# Patient Record
Sex: Male | Born: 1945 | Race: White | Hispanic: No | Smoking: Former smoker
Health system: Southern US, Community
[De-identification: ages and names within clinical notes are randomized; demographics above are authoritative.]

## PROBLEM LIST (undated history)

## (undated) DIAGNOSIS — I10 Essential (primary) hypertension: Secondary | ICD-10-CM

## (undated) DIAGNOSIS — I213 ST elevation (STEMI) myocardial infarction of unspecified site: Secondary | ICD-10-CM

## (undated) DIAGNOSIS — R011 Cardiac murmur, unspecified: Secondary | ICD-10-CM

## (undated) DIAGNOSIS — E785 Hyperlipidemia, unspecified: Secondary | ICD-10-CM

## (undated) DIAGNOSIS — I2510 Atherosclerotic heart disease of native coronary artery without angina pectoris: Secondary | ICD-10-CM

## (undated) DIAGNOSIS — R7303 Prediabetes: Secondary | ICD-10-CM

## (undated) DIAGNOSIS — D689 Coagulation defect, unspecified: Secondary | ICD-10-CM

## (undated) DIAGNOSIS — I4891 Unspecified atrial fibrillation: Secondary | ICD-10-CM

## (undated) DIAGNOSIS — M1990 Unspecified osteoarthritis, unspecified site: Secondary | ICD-10-CM

## (undated) DIAGNOSIS — I499 Cardiac arrhythmia, unspecified: Secondary | ICD-10-CM

## (undated) DIAGNOSIS — E119 Type 2 diabetes mellitus without complications: Secondary | ICD-10-CM

## (undated) DIAGNOSIS — E8881 Metabolic syndrome: Secondary | ICD-10-CM

## (undated) DIAGNOSIS — R0600 Dyspnea, unspecified: Secondary | ICD-10-CM

## (undated) DIAGNOSIS — G4730 Sleep apnea, unspecified: Secondary | ICD-10-CM

## (undated) HISTORY — PX: CARDIAC CATHETERIZATION: SHX172

## (undated) HISTORY — DX: Prediabetes: R73.03

## (undated) HISTORY — DX: Metabolic syndrome: E88.810

## (undated) HISTORY — DX: Coagulation defect, unspecified: D68.9

## (undated) HISTORY — PX: ROTATOR CUFF REPAIR: SHX139

## (undated) HISTORY — PX: OTHER SURGICAL HISTORY: SHX169

## (undated) HISTORY — PX: HERNIA REPAIR: SHX51

## (undated) HISTORY — PX: CARDIAC VALVE REPLACEMENT: SHX585

## (undated) HISTORY — DX: Unspecified osteoarthritis, unspecified site: M19.90

## (undated) HISTORY — DX: Cardiac murmur, unspecified: R01.1

## (undated) HISTORY — DX: Cardiac arrhythmia, unspecified: I49.9

## (undated) HISTORY — DX: Hyperlipidemia, unspecified: E78.5

## (undated) HISTORY — PX: APPENDECTOMY: SHX54

## (undated) HISTORY — DX: Essential (primary) hypertension: I10

## (undated) HISTORY — PX: TONSILLECTOMY: SHX5217

## (undated) HISTORY — DX: Metabolic syndrome: E88.81

## (undated) SURGERY — Surgical Case
Anesthesia: *Unknown

---

## 2012-06-16 ENCOUNTER — Ambulatory Visit: Payer: Medicare Other | Attending: Family Medicine | Admitting: Physical Therapy

## 2012-06-16 DIAGNOSIS — M545 Low back pain, unspecified: Secondary | ICD-10-CM | POA: Insufficient documentation

## 2012-06-16 DIAGNOSIS — R5381 Other malaise: Secondary | ICD-10-CM | POA: Insufficient documentation

## 2012-06-16 DIAGNOSIS — IMO0001 Reserved for inherently not codable concepts without codable children: Secondary | ICD-10-CM | POA: Insufficient documentation

## 2012-06-18 ENCOUNTER — Ambulatory Visit: Payer: Medicare Other | Admitting: Physical Therapy

## 2012-06-30 ENCOUNTER — Ambulatory Visit: Payer: Medicare Other | Attending: Family Medicine | Admitting: Physical Therapy

## 2012-06-30 DIAGNOSIS — M545 Low back pain, unspecified: Secondary | ICD-10-CM | POA: Insufficient documentation

## 2012-06-30 DIAGNOSIS — IMO0001 Reserved for inherently not codable concepts without codable children: Secondary | ICD-10-CM | POA: Insufficient documentation

## 2012-06-30 DIAGNOSIS — R5381 Other malaise: Secondary | ICD-10-CM | POA: Insufficient documentation

## 2012-07-02 ENCOUNTER — Ambulatory Visit: Payer: Medicare Other | Admitting: Physical Therapy

## 2012-07-07 ENCOUNTER — Ambulatory Visit: Payer: Medicare Other | Admitting: Physical Therapy

## 2012-07-09 ENCOUNTER — Ambulatory Visit: Payer: Medicare Other | Admitting: Physical Therapy

## 2012-07-14 ENCOUNTER — Ambulatory Visit: Payer: Medicare Other | Admitting: Physical Therapy

## 2012-07-17 ENCOUNTER — Encounter: Payer: Medicare Other | Admitting: Physical Therapy

## 2012-07-22 ENCOUNTER — Other Ambulatory Visit: Payer: Self-pay | Admitting: Family Medicine

## 2012-07-22 DIAGNOSIS — M545 Low back pain, unspecified: Secondary | ICD-10-CM

## 2012-07-25 ENCOUNTER — Other Ambulatory Visit: Payer: Medicare Other

## 2012-07-31 ENCOUNTER — Other Ambulatory Visit: Payer: Self-pay | Admitting: Family Medicine

## 2012-07-31 DIAGNOSIS — Z139 Encounter for screening, unspecified: Secondary | ICD-10-CM

## 2012-08-04 ENCOUNTER — Ambulatory Visit
Admission: RE | Admit: 2012-08-04 | Discharge: 2012-08-04 | Disposition: A | Discharge status: Home / Self Care | Payer: Medicare Other | Source: Ambulatory Visit | Attending: Family Medicine | Admitting: Family Medicine

## 2012-08-04 DIAGNOSIS — M545 Low back pain, unspecified: Secondary | ICD-10-CM

## 2012-08-04 DIAGNOSIS — Z139 Encounter for screening, unspecified: Secondary | ICD-10-CM

## 2012-12-31 ENCOUNTER — Ambulatory Visit (INDEPENDENT_AMBULATORY_CARE_PROVIDER_SITE_OTHER): Payer: Medicare Other | Admitting: General Practice

## 2012-12-31 ENCOUNTER — Encounter: Payer: Self-pay | Admitting: General Practice

## 2012-12-31 VITALS — BP 144/82 | HR 82 | Temp 99.3°F | Ht 69.0 in | Wt 330.0 lb

## 2012-12-31 DIAGNOSIS — T148 Other injury of unspecified body region: Secondary | ICD-10-CM

## 2012-12-31 DIAGNOSIS — W57XXXA Bitten or stung by nonvenomous insect and other nonvenomous arthropods, initial encounter: Secondary | ICD-10-CM

## 2012-12-31 MED ORDER — DOXYCYCLINE HYCLATE 100 MG PO TABS: ORAL_TABLET | ORAL | Status: DC

## 2012-12-31 NOTE — Progress Notes (Signed)
  Subjective:    Patient ID: Ryan Garner, male    DOB: [birth year], 67 y.o.   MRN: [id number]  HPI Presents today with complaints of tick bite and would like labs. Reports he was bitten by a tick in May and on yesterday. Reports the ticks were removed in 24 hours, fully intact.     Review of Systems  Constitutional: Positive for fever. Negative for chills.       Reports feeling warm at home, didn't check temperature  HENT: Negative for neck pain and neck stiffness.   Respiratory: Negative for chest tightness and shortness of breath.   Cardiovascular: Negative for chest pain and palpitations.  Gastrointestinal: Negative for abdominal pain and blood in stool.  Genitourinary: Negative for dysuria, hematuria and difficulty urinating.  Neurological: Positive for dizziness and headaches. Negative for syncope and weakness.       Reports have an episode of slight dizziness and headache that occurred once       Objective:   Physical Exam  Constitutional: He is oriented to person, place, and time. He appears well-developed and well-nourished.  HENT:  Head: Normocephalic and atraumatic.  Right Ear: External ear normal.  Left Ear: External ear normal.  Cardiovascular: Normal rate, regular rhythm and normal heart sounds.   Pulmonary/Chest: Effort normal and breath sounds normal. No respiratory distress. He exhibits no tenderness.  Abdominal: Soft. Bowel sounds are normal. There is no tenderness.  Neurological: He is alert and oriented to person, place, and time.  Skin: Skin is warm and dry. There is erythema.  Erythematous area size of pencil eraser noted to mid back. Negative drainage or tenderness.   Psychiatric: He has a normal mood and affect.          Assessment & Plan:  1. Tick bite - doxycycline (VIBRA-TABS) 100 MG tablet; Take one tablet twice a day on day one, then take one tablet daily for next 14 days  Dispense: 16 tablet; Refill: 0 - B. burgdorfi antibodies - [place]  spotted fvr abs pnl(IgG+IgM) -keep area clean and dry -RTO if symptoms worsen or unresolved -avoid tick infested area and attachement -Patient verbalized understanding -Coralie Keens, FNP-C

## 2012-12-31 NOTE — Patient Instructions (Signed)
Deer Tick Bite Deer ticks are brown arachnids (spider family) that vary in size from as small as the head of a pin to 1/4 inch (1/2 cm) diameter. They thrive in wooded areas. Deer are the preferred host of adult deer ticks. Small rodents are the host of young ticks (nymphs). When a person walks in a field or wooded area, young and adult ticks in the surrounding grass and vegetation can attach themselves to the skin. They can suck blood for hours to days if unnoticed. Ticks are found all over the [place] Some ticks carry a specific bacteria (Borrelia burgdorferi) that causes an infection called Lyme disease. The bacteria is typically passed into a person during the blood sucking process. This happens after the tick has been attached for at least a number of hours. While ticks can be found all over the [place] those carrying the bacteria that causes Lyme disease are most common in [place] and the [place]. Only a small proportion of ticks in these areas carry the Lyme disease bacteria and cause human infections. Ticks usually attach to warm spots on the body, such as the:  Head.  Back.  Neck.  Armpits.  Groin. SYMPTOMS  Most of the time, a deer tick bite will not be felt. You may or may not see the attached tick. You may notice mild irritation or redness around the bite site. If the deer tick passes the Lyme disease bacteria to a person, a round, red rash may be noticed 2 to 3 days after the bite. The rash may be clear in the middle, like a bull's-eye or target. If not treated, other symptoms may develop several days to weeks after the onset of the rash. These symptoms may include:  New rash lesions.  Fatigue and weakness.  General ill feeling and achiness.  Chills.  Headache and neck pain.  Swollen lymph glands.  Sore muscles and joints. 5 to 15% of untreated people with Lyme disease may develop more severe illnesses after several weeks to months. This may include inflammation of the  brain lining (meningitis), nerve palsies, an abnormal heartbeat, or severe muscle and joint pain and inflammation (myositis or arthritis). DIAGNOSIS   Physical exam and medical history.  Viewing the tick if it was saved for confirmation.  Blood tests (to check or confirm the presence of Lyme disease). TREATMENT  Most ticks do not carry disease. If found, an attached tick should be removed using tweezers. Tweezers should be placed under the body of the tick so it is removed by its attachment parts (pincers). If there are signs or symptoms of being sick, or Lyme disease is confirmed, medicines (antibiotics) that kill germs are usually prescribed. In more severe cases, antibiotics may be given through an intravenous (IV) access. HOME CARE INSTRUCTIONS   Always remove ticks with tweezers. Do not use petroleum jelly or other methods to kill or remove the tick. Slide the tweezers under the body and pull out as much as you can. If you are not sure what it is, save it in a jar and show your caregiver.  Once you remove the tick, the skin will heal on its own. Wash your hands and the affected area with water and soap. You may place a bandage on the affected area.  Take medicine as directed. You may be advised to take a full course of antibiotics.  Follow up with your caregiver as recommended. FINDING OUT THE RESULTS OF YOUR TEST Not all test results are available   during your visit. If your test results are not back during the visit, make an appointment with your caregiver to find out the results. Do not assume everything is normal if you have not heard from your caregiver or the medical facility. It is important for you to follow up on all of your test results. PROGNOSIS  If Lyme disease is confirmed, early treatment with antibiotics is very effective. Following preventive guidelines is important since it is possible to get the disease more than once. PREVENTION   Wear long sleeves and long pants in  wooded or grassy areas. Tuck your pants into your socks.  Use an insect repellent while hiking.  Check yourself, your children, and your pets regularly for ticks after playing outside.  Clear piles of leaves or brush from your yard. Ticks might live there. SEEK MEDICAL CARE IF:   You or your child has an oral temperature above 102 F (38.9 C).  You develop a severe headache following the bite.  You feel generally ill.  You notice a rash.  You are having trouble removing the tick.  The bite area has red skin or yellow drainage. SEEK IMMEDIATE MEDICAL CARE IF:   Your face is weak and droopy or you have other neurological symptoms.  You have severe joint pain or weakness. MAKE SURE YOU:   Understand these instructions.  Will watch your condition.  Will get help right away if you are not doing well or get worse. FOR MORE INFORMATION Centers for Disease Control and Prevention: www.cdc.gov American Academy of [employer] Physicians: www.aafp.org Document Released: 10/03/2009 Document Revised: 10/01/2011 Document Reviewed: 10/03/2009 [employer] Patient Information 2014 [employer].  

## 2013-01-01 LAB — B. BURGDORFI ANTIBODIES: B burgdorferi Ab IgG+IgM: 0.19 {ISR}

## 2013-01-02 LAB — ROCKY MTN SPOTTED FVR ABS PNL(IGG+IGM)
RMSF IgG: 0.33 IV
RMSF IgM: 0.14 IV

## 2013-03-06 DIAGNOSIS — M4310 Spondylolisthesis, site unspecified: Secondary | ICD-10-CM | POA: Insufficient documentation

## 2013-03-06 DIAGNOSIS — M5136 Other intervertebral disc degeneration, lumbar region: Secondary | ICD-10-CM | POA: Insufficient documentation

## 2013-03-06 DIAGNOSIS — M51369 Other intervertebral disc degeneration, lumbar region without mention of lumbar back pain or lower extremity pain: Secondary | ICD-10-CM | POA: Insufficient documentation

## 2013-03-06 DIAGNOSIS — M47816 Spondylosis without myelopathy or radiculopathy, lumbar region: Secondary | ICD-10-CM | POA: Insufficient documentation

## 2013-03-06 DIAGNOSIS — M48061 Spinal stenosis, lumbar region without neurogenic claudication: Secondary | ICD-10-CM | POA: Insufficient documentation

## 2013-03-18 ENCOUNTER — Other Ambulatory Visit: Payer: Self-pay | Admitting: Family Medicine

## 2013-06-21 ENCOUNTER — Other Ambulatory Visit: Payer: Self-pay | Admitting: Family Medicine

## 2013-06-23 NOTE — Telephone Encounter (Signed)
Last seen 12/31/12  requesting 90 day supply

## 2013-06-24 ENCOUNTER — Telehealth: Payer: Self-pay | Admitting: *Deleted

## 2013-06-24 NOTE — Telephone Encounter (Signed)
Please inform patient that he will need to schedule appointment for routine follow up of blood pressure and labs. Blood pressure medication refilled for 30 days.

## 2013-06-24 NOTE — Telephone Encounter (Signed)
Aware of 30 day supply of BP meds and must make appt. to follow up with labwork.

## 2013-07-06 DIAGNOSIS — M170 Bilateral primary osteoarthritis of knee: Secondary | ICD-10-CM | POA: Insufficient documentation

## 2013-07-06 DIAGNOSIS — M1711 Unilateral primary osteoarthritis, right knee: Secondary | ICD-10-CM | POA: Insufficient documentation

## 2013-07-22 ENCOUNTER — Other Ambulatory Visit: Payer: Medicare Other

## 2013-07-24 ENCOUNTER — Other Ambulatory Visit: Payer: Self-pay | Admitting: General Practice

## 2013-07-27 NOTE — Telephone Encounter (Signed)
Last seen 12/31/12  Ryan Garner

## 2013-07-31 ENCOUNTER — Encounter: Payer: Medicare Other | Admitting: Family Medicine

## 2013-08-07 ENCOUNTER — Encounter: Payer: Self-pay | Admitting: Family Medicine

## 2013-08-07 ENCOUNTER — Ambulatory Visit (INDEPENDENT_AMBULATORY_CARE_PROVIDER_SITE_OTHER): Payer: Medicare Other | Admitting: Family Medicine

## 2013-08-07 VITALS — BP 142/76 | HR 89 | Temp 99.1°F | Ht 69.0 in | Wt 328.6 lb

## 2013-08-07 DIAGNOSIS — R7309 Other abnormal glucose: Secondary | ICD-10-CM | POA: Diagnosis not present

## 2013-08-07 DIAGNOSIS — I10 Essential (primary) hypertension: Secondary | ICD-10-CM | POA: Insufficient documentation

## 2013-08-07 DIAGNOSIS — Z125 Encounter for screening for malignant neoplasm of prostate: Secondary | ICD-10-CM | POA: Insufficient documentation

## 2013-08-07 DIAGNOSIS — R7303 Prediabetes: Secondary | ICD-10-CM

## 2013-08-07 DIAGNOSIS — E8881 Metabolic syndrome: Secondary | ICD-10-CM | POA: Insufficient documentation

## 2013-08-07 DIAGNOSIS — Z1211 Encounter for screening for malignant neoplasm of colon: Secondary | ICD-10-CM | POA: Insufficient documentation

## 2013-08-07 DIAGNOSIS — Z119 Encounter for screening for infectious and parasitic diseases, unspecified: Secondary | ICD-10-CM | POA: Diagnosis not present

## 2013-08-07 DIAGNOSIS — E785 Hyperlipidemia, unspecified: Secondary | ICD-10-CM | POA: Insufficient documentation

## 2013-08-07 DIAGNOSIS — Z0000 Encounter for general adult medical examination without abnormal findings: Secondary | ICD-10-CM | POA: Diagnosis not present

## 2013-08-07 DIAGNOSIS — R739 Hyperglycemia, unspecified: Secondary | ICD-10-CM | POA: Insufficient documentation

## 2013-08-07 LAB — POCT CBC
Granulocyte percent: 71.4 %G (ref 37–80)
HCT, POC: 44 % (ref 43.5–53.7)
Hemoglobin: 14.3 g/dL (ref 14.1–18.1)
Lymph, poc: 2.3 (ref 0.6–3.4)
MCH, POC: 28.1 pg (ref 27–31.2)
MCHC: 32.6 g/dL (ref 31.8–35.4)
MCV: 86.3 fL (ref 80–97)
MPV: 6.8 fL (ref 0–99.8)
POC Granulocyte: 6.7 (ref 2–6.9)
POC LYMPH PERCENT: 25 %L (ref 10–50)
Platelet Count, POC: 296 10*3/uL (ref 142–424)
RBC: 5.1 M/uL (ref 4.69–6.13)
RDW, POC: 13.5 %
WBC: 9.4 10*3/uL (ref 4.6–10.2)

## 2013-08-07 LAB — POCT GLYCOSYLATED HEMOGLOBIN (HGB A1C): Hemoglobin A1C: 5.8

## 2013-08-07 MED ORDER — LISINOPRIL-HYDROCHLOROTHIAZIDE 10-12.5 MG PO TABS: ORAL_TABLET | ORAL | Status: DC

## 2013-08-07 NOTE — Progress Notes (Signed)
Patient ID: Ryan Garner, male   DOB: [birth year], 68 y.o.   MRN: [id number] SUBJECTIVE: CC: Chief Complaint  Patient presents with  . Annual Exam    [facility] WANTS 90 DAY SUPPLY ON BP MED. ? NEED COLONSCOPY    HPI: Here for an annual physical. Also, for wellness. Patient is here for follow up of hypertension/HLD/PreDM/metabolic  syndrome: denies Headache;deniesChest Pain;denies weakness;denies Shortness of Breath or Orthopnea;denies Visual changes;denies palpitations;denies cough;denies pedal edema;denies symptoms of TIA or stroke; admits to Compliance with medications. denies Problems with medications.   Past Medical History  Diagnosis Date  . Metabolic syndrome   . Hypertension   . Hyperlipidemia   . Prediabetes   . Metabolic syndrome    Past Surgical History  Procedure Laterality Date  . Hernia repair    . Tonsillectomy    . Rotator cuff repair Bilateral    History   Social History  . Marital Status: Married    Spouse Name: N/A    Number of Children: N/A  . Years of Education: N/A   Occupational History  . Not on file.   Social History Main Topics  . Smoking status: Former Games developer  . Smokeless tobacco: Never Used  . Alcohol Use: No  . Drug Use: No  . Sexual Activity: Not on file   Other Topics Concern  . Not on file   Social History Narrative  . No narrative on file   Family History  Problem Relation Age of Onset  . Cancer Mother     lungs  . Coronary artery disease Mother   . Diabetes Brother    Current Outpatient Prescriptions on File Prior to Visit  Medication Sig Dispense Refill  . doxycycline (VIBRA-TABS) 100 MG tablet Take one tablet twice a day on day one, then take one tablet daily for next 14 days  16 tablet  0   No current facility-administered medications on file prior to visit.   No Known Allergies Immunization History  Administered Date(s) Administered  . Pneumococcal Polysaccharide-23 08/07/2013  . Tetanus 07/23/2006   Prior to  Admission medications   Medication Sig Start Date End Date Taking? Authorizing Provider  doxycycline (VIBRA-TABS) 100 MG tablet Take one tablet twice a day on day one, then take one tablet daily for next 14 days 12/31/12   Coralie Keens, FNP  lisinopril-hydrochlorothiazide (PRINZIDE,ZESTORETIC) 10-12.5 MG per tablet TAKE ONE TABLET BY MOUTH ONE TIME DAILY 07/24/13   Coralie Keens, FNP     ROS: As above in the HPI. All other systems are stable or negative.  OBJECTIVE: APPEARANCE:  Patient in no acute distress.The patient appeared well nourished and normally developed. Acyanotic. Waist: VITAL SIGNS:BP 142/76  Pulse 89  Temp(Src) 99.1 F (37.3 C) (Oral)  Ht 5\' 9"  (1.753 m)  Wt 328 lb 9.6 oz (149.052 kg)  BMI 48.50 kg/m2  SpO2 97%  Morbidly obese WM  SKIN: warm and  Dry without overt rashes, tattoos and scars. Multiple skin tags.   HEAD and Neck: without JVD, Head and scalp: normal Eyes:No scleral icterus. Fundi normal, eye movements normal. Ears: Auricle normal, canal normal, Tympanic membranes normal, insufflation normal. Nose: normal Throat: normal Neck & thyroid: normal  CHEST & LUNGS: Chest wall: normal Lungs: Clear  CVS: Reveals the PMI to be normally located. Regular rhythm, First and Second Heart sounds are normal,  absence of murmurs, rubs or gallops. Peripheral vasculature: Radial pulses: normal Dorsal pedis pulses: normal Posterior pulses: normal  ABDOMEN:  Appearance: obese Benign,  no organomegaly, no masses, no Abdominal Aortic enlargement. No Guarding , no rebound. No Bruits. Bowel sounds: normal  RECTAL: heme negative brown stool. Moderately enlarged prostate, smooth, normal groove.  GU: no abnormality. Testes okay No hernias.  EXTREMETIES: nonedematous.  MUSCULOSKELETAL:  Spine: normal Joints: knees , genu valgus, mild crepitus  NEUROLOGIC: oriented to time,place and person; nonfocal. Strength is normal Sensory is normal Reflexes  are normal Cranial Nerves are normal.  Results for orders placed in visit on 12/31/12  B. BURGDORFI ANTIBODIES      Result Value Range   B burgdorferi Ab IgG+IgM 0.19    ROCKY MTN SPOTTED FVR ABS PNL(IGG+IGM)      Result Value Range   RMSF IgG 0.33     RMSF IgM 0.14      ASSESSMENT: Annual physical exam - Plan: POCT CBC  Hypertension - Plan: lisinopril-hydrochlorothiazide (PRINZIDE,ZESTORETIC) 10-12.5 MG per tablet, CMP14+EGFR  Hyperlipidemia - Plan: CMP14+EGFR, NMR, lipoprofile  Hyperglycemia - Plan: POCT glycosylated hemoglobin (Hb A1C)  Screening for prostate cancer - Plan: PSA, total and free  Screening examination for infectious disease - Plan: Hepatitis C antibody  Screen for colon cancer - Plan: POCT CBC, Fecal occult blood, imunochemical  Metabolic syndrome  Prediabetes  PLAN:      HEALTH MAINTENANCE Immunizations: Tetanus-Diphtheria Booster due:2018 Pertusis Booster due:2018 Flu Shot Due: every Fall Pneumonia Vaccine: usually at 70 years of age unless there are certain risk situations. Herpes Zoster/Shingles Vaccine due: usually at 70 years of age HPV HGD:JMEQ age 75 to 48 years in males and females.  Healthy Life Habits: Exercise Goal: 5-6 days/week; start gradually(ie 30 minutes/3days per week) Nutrition: Balanced healthy meals including Vegetables and Fruits. Consider  Reading the following books: 1) Eat to Live by Dr Diona Browner; 2) Prevent and Reverse Heart Disease by Dr Karl Luke.  Vitamins:multivitamin Aspirin:81 mg daily Stop Tobacco Use: Seat Belt Use:+++ recommended Sunscreen Use:+++ recommended  Recommended Screening Tests: Colon Cancer Screening:check with insurance Blood work: today Cholesterol Screening:   today         HIV:      n/a              Hepatitis C(people born 1945-1965):today   Monthly Self Testicular Exam:+++  Eye Exam: every 1 to 2 years recommended Dental Health: at least every 6 months  Others:    Living  Will/Healthcare Power of Attorney: should have this in order with your personal estate planning   Orders Placed This Encounter  Procedures  . Fecal occult blood, imunochemical  . CMP14+EGFR  . NMR, lipoprofile  . PSA, total and free  . Hepatitis C antibody  . POCT CBC  . POCT glycosylated hemoglobin (Hb A1C)   Meds ordered this encounter  Medications  . lisinopril-hydrochlorothiazide (PRINZIDE,ZESTORETIC) 10-12.5 MG per tablet    Sig: TAKE ONE TABLET BY MOUTH ONE TIME DAILY    Dispense:  90 tablet    Refill:  3   Medications Discontinued During This Encounter  Medication Reason  . lisinopril-hydrochlorothiazide (PRINZIDE,ZESTORETIC) 10-12.5 MG per tablet Reorder   Return in about 3 months (around 11/05/2013) for Recheck medical problems.  Ollis Daudelin P. Jacelyn Grip, M.D.

## 2013-08-07 NOTE — Patient Instructions (Addendum)
HEALTH MAINTENANCE Immunizations: Tetanus-Diphtheria Booster due:2018 Pertusis Booster due:2018 Flu Shot Due: every Fall Pneumonia Vaccine: usually at 70 years of age unless there are certain risk situations. Herpes Zoster/Shingles Vaccine due: usually at 70 years of age HPV JKD:TOIZ age 57 to 73 years in males and females.  Healthy Life Habits: Exercise Goal: 5-6 days/week; start gradually(ie 30 minutes/3days per week) Nutrition: Balanced healthy meals including Vegetables and Fruits. Consider  Reading the following books: 1) Eat to Live by Dr Diona Browner; 2) Prevent and Reverse Heart Disease by Dr Karl Luke.  Vitamins:multivitamin Aspirin:81 mg daily Stop Tobacco Use: Seat Belt Use:+++ recommended Sunscreen Use:+++ recommended  Recommended Screening Tests: Colon Cancer Screening:check with insurance Blood work: today Cholesterol Screening:   today         HIV:      n/a              Hepatitis C(people born 1945-1965):today   Monthly Self Testicular Exam:+++  Eye Exam: every 1 to 2 years recommended Dental Health: at least every 6 months  Others:    Living Will/Healthcare Power of Attorney: should have this in order with your personal estate planning

## 2013-08-09 ENCOUNTER — Other Ambulatory Visit: Payer: Self-pay | Admitting: Family Medicine

## 2013-08-09 DIAGNOSIS — E7800 Pure hypercholesterolemia, unspecified: Secondary | ICD-10-CM

## 2013-08-09 LAB — NMR, LIPOPROFILE
Cholesterol: 215 mg/dL — ABNORMAL HIGH (ref <200)
HDL Cholesterol by NMR: 51 mg/dL (ref >=40)
HDL Particle Number: 40.5 umol/L (ref >=30.5)
LDL Particle Number: 2075 nmol/L — ABNORMAL HIGH (ref <1000)
LDL Size: 20.9 nm (ref >20.5)
LDLC SERPL CALC-MCNC: 147 mg/dL — ABNORMAL HIGH (ref <100)
LP-IR Score: 46 — ABNORMAL HIGH (ref <=45)
Small LDL Particle Number: 808 nmol/L — ABNORMAL HIGH (ref <=527)
Triglycerides by NMR: 84 mg/dL (ref <150)

## 2013-08-09 LAB — CMP14+EGFR
ALT: 19 IU/L (ref 0–44)
AST: 17 IU/L (ref 0–40)
Albumin/Globulin Ratio: 2 (ref 1.1–2.5)
Albumin: 4.5 g/dL (ref 3.6–4.8)
Alkaline Phosphatase: 62 IU/L (ref 39–117)
BUN/Creatinine Ratio: 15 (ref 10–22)
BUN: 16 mg/dL (ref 8–27)
CO2: 26 mmol/L (ref 18–29)
Calcium: 10.1 mg/dL (ref 8.6–10.2)
Chloride: 96 mmol/L — ABNORMAL LOW (ref 97–108)
Creatinine, Ser: 1.07 mg/dL (ref 0.76–1.27)
GFR calc Af Amer: 81 mL/min/{1.73_m2} (ref >59)
GFR calc non Af Amer: 70 mL/min/{1.73_m2} (ref >59)
Globulin, Total: 2.2 g/dL (ref 1.5–4.5)
Glucose: 91 mg/dL (ref 65–99)
Potassium: 5 mmol/L (ref 3.5–5.2)
Sodium: 138 mmol/L (ref 134–144)
Total Bilirubin: 0.4 mg/dL (ref 0.0–1.2)
Total Protein: 6.7 g/dL (ref 6.0–8.5)

## 2013-08-09 LAB — HEPATITIS C ANTIBODY: Hep C Virus Ab: 0.1 s/co ratio (ref 0.0–0.9)

## 2013-08-09 LAB — PSA, TOTAL AND FREE
PSA, Free Pct: 18.2 %
PSA, Free: 0.4 ng/mL
PSA: 2.2 ng/mL (ref 0.0–4.0)

## 2013-08-09 MED ORDER — PRAVASTATIN SODIUM 10 MG PO TABS: 10.0000 mg | ORAL_TABLET | Freq: Every day | ORAL | Status: DC

## 2013-08-09 MED ORDER — ASPIRIN EC 81 MG PO TBEC: 81.0000 mg | DELAYED_RELEASE_TABLET | Freq: Every day | ORAL | Status: DC

## 2013-08-09 NOTE — Progress Notes (Signed)
Quick Note:  Call Patient Labs that are abnormal: HGBA1C is prediabetes The LDL particles is high and the LDLc is not at goal of less than 100.  The rest are at goal  Recommendations: I suggest that he starts on a low dose statin and a baby aspirin daily. Ordered in EPIC. Follow up in 3 months   ______

## 2013-08-10 ENCOUNTER — Telehealth: Payer: Self-pay | Admitting: Family Medicine

## 2013-08-10 NOTE — Telephone Encounter (Signed)
Message copied by Waverly Ferrari on Mon Aug 10, 2013  3:54 PM ------      Message from: Vernie Shanks      Created: Sun Aug 09, 2013  8:57 PM       Call Patient      Labs that are abnormal:      HGBA1C is prediabetes      The LDL particles is high and the LDLc is not at goal of less than 100.            The rest are at goal            Recommendations:      I suggest that he starts on a low dose statin and a baby aspirin daily. Ordered in EPIC.      Follow up in 3 months             ------

## 2013-08-11 ENCOUNTER — Other Ambulatory Visit: Payer: Medicare Other

## 2013-08-11 DIAGNOSIS — Z1212 Encounter for screening for malignant neoplasm of rectum: Secondary | ICD-10-CM | POA: Diagnosis not present

## 2013-08-11 NOTE — Progress Notes (Signed)
Pt came in for labs only 

## 2013-08-12 ENCOUNTER — Encounter: Payer: Self-pay | Admitting: *Deleted

## 2013-08-12 LAB — FECAL OCCULT BLOOD, IMMUNOCHEMICAL: Fecal Occult Bld: NEGATIVE

## 2013-08-12 NOTE — Progress Notes (Signed)
Quick Note:  Copy of labs sent to patient ______ 

## 2013-09-07 DIAGNOSIS — E669 Obesity, unspecified: Secondary | ICD-10-CM | POA: Diagnosis not present

## 2013-09-07 DIAGNOSIS — M4310 Spondylolisthesis, site unspecified: Secondary | ICD-10-CM | POA: Diagnosis not present

## 2013-09-07 DIAGNOSIS — M48061 Spinal stenosis, lumbar region without neurogenic claudication: Secondary | ICD-10-CM | POA: Diagnosis not present

## 2013-09-07 DIAGNOSIS — M5137 Other intervertebral disc degeneration, lumbosacral region: Secondary | ICD-10-CM | POA: Diagnosis not present

## 2013-10-05 ENCOUNTER — Telehealth: Payer: Self-pay | Admitting: Family Medicine

## 2013-10-05 DIAGNOSIS — H02429 Myogenic ptosis of unspecified eyelid: Secondary | ICD-10-CM | POA: Diagnosis not present

## 2013-10-05 DIAGNOSIS — H2510 Age-related nuclear cataract, unspecified eye: Secondary | ICD-10-CM | POA: Diagnosis not present

## 2013-10-05 NOTE — Telephone Encounter (Signed)
SPOKE WITH PT AND SAYS [facility]  CAN WORK HIM IN TODAY. NEEDS NOTE SAYING HE CAN TAKE THE YELLOW FEVER SHOT  FAX NOTE TO [phone]  NEEDS NOTE BEFORE 1;30 today

## 2013-10-05 NOTE — Telephone Encounter (Signed)
Note faxed as requested

## 2013-10-14 ENCOUNTER — Telehealth: Payer: Self-pay | Admitting: Family Medicine

## 2013-10-14 NOTE — Telephone Encounter (Signed)
Appt given for first thing in the am per wifes request

## 2013-10-15 ENCOUNTER — Ambulatory Visit (INDEPENDENT_AMBULATORY_CARE_PROVIDER_SITE_OTHER): Payer: Medicare Other | Admitting: Family Medicine

## 2013-10-15 ENCOUNTER — Encounter: Payer: Self-pay | Admitting: Family Medicine

## 2013-10-15 ENCOUNTER — Ambulatory Visit: Payer: Medicare Other | Admitting: Family Medicine

## 2013-10-15 ENCOUNTER — Ambulatory Visit (INDEPENDENT_AMBULATORY_CARE_PROVIDER_SITE_OTHER): Payer: Medicare Other

## 2013-10-15 VITALS — BP 156/88 | HR 81 | Temp 98.8°F | Ht 69.0 in | Wt 337.8 lb

## 2013-10-15 DIAGNOSIS — R7309 Other abnormal glucose: Secondary | ICD-10-CM | POA: Diagnosis not present

## 2013-10-15 DIAGNOSIS — E8881 Metabolic syndrome: Secondary | ICD-10-CM

## 2013-10-15 DIAGNOSIS — R059 Cough, unspecified: Secondary | ICD-10-CM | POA: Diagnosis not present

## 2013-10-15 DIAGNOSIS — R05 Cough: Secondary | ICD-10-CM

## 2013-10-15 DIAGNOSIS — R062 Wheezing: Secondary | ICD-10-CM | POA: Insufficient documentation

## 2013-10-15 DIAGNOSIS — J209 Acute bronchitis, unspecified: Secondary | ICD-10-CM | POA: Insufficient documentation

## 2013-10-15 DIAGNOSIS — R7303 Prediabetes: Secondary | ICD-10-CM

## 2013-10-15 DIAGNOSIS — I10 Essential (primary) hypertension: Secondary | ICD-10-CM

## 2013-10-15 DIAGNOSIS — E785 Hyperlipidemia, unspecified: Secondary | ICD-10-CM

## 2013-10-15 MED ORDER — ALBUTEROL SULFATE HFA 108 (90 BASE) MCG/ACT IN AERS: 2.0000 | INHALATION_SPRAY | Freq: Four times a day (QID) | RESPIRATORY_TRACT | Status: DC | PRN

## 2013-10-15 MED ORDER — AMOXICILLIN 875 MG PO TABS: 875.0000 mg | ORAL_TABLET | Freq: Two times a day (BID) | ORAL | Status: DC

## 2013-10-15 NOTE — Patient Instructions (Signed)
Bronchospasm, Adult A bronchospasm is when the tubes that carry air in and out of your lungs (airwarys) spasm or tighten. During a bronchospasm it is hard to breathe. This is because the airways get smaller. A bronchospasm can be triggered by:  Allergies. These may be to animals, pollen, food, or mold.  Infection. This is a common cause of bronchospasm.  Exercise.  Irritants. These include pollution, cigarette smoke, strong odors, aerosol sprays, and paint fumes.  Weather changes.  Stress.  Being emotional. HOME CARE   Always have a plan for getting help. Know when to call your doctor and local emergency services (911 in the [place] Know where you can get emergency care.  Only take medicines as told by your doctor.  If you were prescribed an inhaler or nebulizer machine, ask your doctor how to use it correctly. Always use a spacer with your inhaler if you were given one.  Stay calm during an attack. Try to relax and breathe more slowly.  Control your home environment:  Change your heating and air conditioning filter at least once a month.  Limit your use of fireplaces and wood stoves.  Do not  smoke. Do not  allow smoking in your home.  Avoid perfumes and fragrances.  Get rid of pests (such as roaches and mice) and their droppings.  Throw away plants if you see mold on them.  Keep your house clean and dust free.  Replace carpet with wood, tile, or vinyl flooring. Carpet can trap dander and dust.  Use allergy-proof pillows, mattress covers, and box spring covers.  Wash bed sheets and blankets every week in hot water. Dry them in a dryer.  Use blankets that are made of polyester or cotton.  Wash hands frequently. GET HELP IF:  You have muscle aches.  You have chest pain.  The thick spit you spit or cough up (sputum) changes from clear or white to yellow, green, gray, or bloody.  The thick spit you spit or cough up gets thicker.  There are problems that may be  related to the medicine you are given such as:  A rash.  Itching.  Swelling.  Trouble breathing. GET HELP RIGHT AWAY IF:  You feel you cannot breathe or catch your breath.  You cannot stop coughing.  Your treatment is not helping you breathe better. MAKE SURE YOU:   Understand these instructions.  Will watch your condition.  Will get help right away if you are not doing well or get worse. Document Released: 05/06/2009 Document Revised: 03/11/2013 Document Reviewed: 12/30/2012 [facility] Patient Information 2014 [employer].    Acute Bronchitis Bronchitis is inflammation of the airways that extend from the windpipe into the lungs (bronchi). The inflammation often causes mucus to develop. This leads to a cough, which is the most common symptom of bronchitis.  In acute bronchitis, the condition usually develops suddenly and goes away over time, usually in a couple weeks. Smoking, allergies, and asthma can make bronchitis worse. Repeated episodes of bronchitis may cause further lung problems.  CAUSES Acute bronchitis is most often caused by the same virus that causes a cold. The virus can spread from person to person (contagious).  SIGNS AND SYMPTOMS   Cough.   Fever.   Coughing up mucus.   Body aches.   Chest congestion.   Chills.   Shortness of breath.   Sore throat.  DIAGNOSIS  Acute bronchitis is usually diagnosed through a physical exam. Tests, such as chest X-rays, are sometimes  done to rule out other conditions.  TREATMENT  Acute bronchitis usually goes away in a couple weeks. Often times, no medical treatment is necessary. Medicines are sometimes given for relief of fever or cough. Antibiotics are usually not needed but may be prescribed in certain situations. In some cases, an inhaler may be recommended to help reduce shortness of breath and control the cough. A cool mist vaporizer may also be used to help thin bronchial secretions and make it  easier to clear the chest.  HOME CARE INSTRUCTIONS  Get plenty of rest.   Drink enough fluids to keep your urine clear or pale yellow (unless you have a medical condition that requires fluid restriction). Increasing fluids may help thin your secretions and will prevent dehydration.   Only take over-the-counter or prescription medicines as directed by your health care provider.   Avoid smoking and secondhand smoke. Exposure to cigarette smoke or irritating chemicals will make bronchitis worse. If you are a smoker, consider using nicotine gum or skin patches to help control withdrawal symptoms. Quitting smoking will help your lungs heal faster.   Reduce the chances of another bout of acute bronchitis by washing your hands frequently, avoiding people with cold symptoms, and trying not to touch your hands to your mouth, nose, or eyes.   Follow up with your health care provider as directed.  SEEK MEDICAL CARE IF: Your symptoms do not improve after 1 week of treatment.  SEEK IMMEDIATE MEDICAL CARE IF:  You develop an increased fever or chills.   You have chest pain.   You have severe shortness of breath.  You have bloody sputum.   You develop dehydration.  You develop fainting.  You develop repeated vomiting.  You develop a severe headache. MAKE SURE YOU:   Understand these instructions.  Will watch your condition.  Will get help right away if you are not doing well or get worse. Document Released: 08/16/2004 Document Revised: 03/11/2013 Document Reviewed: 12/30/2012 [facility] Patient Information 2014 [place].

## 2013-10-15 NOTE — Progress Notes (Signed)
Patient ID: Ryan Garner, male   DOB: May 31, 1946, 68 y.o.   MRN: [id number] SUBJECTIVE: CC: Chief Complaint  Patient presents with  . Acute Visit    coughing up "alot of stuff" states also have had wheezing    HPI: Acute Bronchitis Patient presents for evaluation of fever, nasal congestion, productive cough and sneezing. Symptoms began 4 days ago and are gradually worsening since that time. Past history is significant for asthma as a child..   Past Medical History  Diagnosis Date  . Metabolic syndrome   . Hypertension   . Hyperlipidemia   . Prediabetes   . Metabolic syndrome    Past Surgical History  Procedure Laterality Date  . Hernia repair    . Tonsillectomy    . Rotator cuff repair Bilateral    History   Social History  . Marital Status: Married    Spouse Name: N/A    Number of Children: N/A  . Years of Education: N/A   Occupational History  . Not on file.   Social History Main Topics  . Smoking status: Former Research scientist (life sciences)  . Smokeless tobacco: Never Used  . Alcohol Use: No  . Drug Use: No  . Sexual Activity: Not on file   Other Topics Concern  . Not on file   Social History Narrative  . No narrative on file   Family History  Problem Relation Age of Onset  . Cancer Mother     lungs  . Coronary artery disease Mother   . Diabetes Brother    Current Outpatient Prescriptions on File Prior to Visit  Medication Sig Dispense Refill  . aspirin EC 81 MG tablet Take 1 tablet (81 mg total) by mouth daily.  30 tablet  11  . doxycycline (VIBRA-TABS) 100 MG tablet Take one tablet twice a day on day one, then take one tablet daily for next 14 days  16 tablet  0  . lisinopril-hydrochlorothiazide (PRINZIDE,ZESTORETIC) 10-12.5 MG per tablet TAKE ONE TABLET BY MOUTH ONE TIME DAILY  90 tablet  3  . pravastatin (PRAVACHOL) 10 MG tablet Take 1 tablet (10 mg total) by mouth daily.  30 tablet  3   No current facility-administered medications on file prior to visit.   No Known  Allergies Immunization History  Administered Date(s) Administered  . Pneumococcal Polysaccharide-23 08/07/2013  . Tetanus 07/23/2006   Prior to Admission medications   Medication Sig Start Date End Date Taking? Authorizing Provider  aspirin EC 81 MG tablet Take 1 tablet (81 mg total) by mouth daily. 08/09/13   Vernie Shanks, MD  doxycycline (VIBRA-TABS) 100 MG tablet Take one tablet twice a day on day one, then take one tablet daily for next 14 days 12/31/12   Erby Pian, FNP  lisinopril-hydrochlorothiazide (PRINZIDE,ZESTORETIC) 10-12.5 MG per tablet TAKE ONE TABLET BY MOUTH ONE TIME DAILY 08/07/13   Vernie Shanks, MD  pravastatin (PRAVACHOL) 10 MG tablet Take 1 tablet (10 mg total) by mouth daily. 08/09/13   Vernie Shanks, MD     ROS: As above in the HPI. All other systems are stable or negative.  OBJECTIVE: APPEARANCE:  Patient in no acute distress.The patient appeared well nourished and normally developed. Acyanotic. Waist: VITAL SIGNS:BP 156/88  Pulse 81  Temp(Src) 98.8 F (37.1 C) (Oral)  Ht 5\' 9"  (1.753 m)  Wt 337 lb 12.8 oz (153.225 kg)  BMI 49.86 kg/m2  SpO2 97% Morbidly obese WM  SKIN: warm and  Dry without overt rashes, tattoos and  scars  HEAD and Neck: without JVD, Head and scalp: normal Eyes:No scleral icterus. Fundi normal, eye movements normal. Ears: Auricle normal, canal normal, Tympanic membranes normal, insufflation normal. Nose: normal Throat: normal Neck & thyroid: normal  CHEST & LUNGS: Chest wall: normal Lungs: Coarse BS and scatterred rhonchi and end expiratory wheezes.Rales  CVS: Reveals the PMI to be normally located. Regular rhythm, First and Second Heart sounds are normal,  absence of murmurs, rubs or gallops. Peripheral vasculature: Radial pulses: normal Dorsal pedis pulses: normal Posterior pulses: normal  ABDOMEN:  Appearance: Obese Benign, no organomegaly, no masses, no Abdominal Aortic enlargement. No Guarding , no  rebound. No Bruits. Bowel sounds: normal  RECTAL: N/A GU: N/A  EXTREMETIES: nonedematous.  MUSCULOSKELETAL:  Spine: normal Joints: intact  NEUROLOGIC: oriented to time,place and person; nonfocal.  ASSESSMENT:  Acute bronchitis - Plan: amoxicillin (AMOXIL) 875 MG tablet  Cough - Plan: DG Chest 2 View  Prediabetes  Metabolic syndrome  Hypertension  Hyperlipidemia  Wheezing - Plan: albuterol (PROVENTIL HFA;VENTOLIN HFA) 108 (90 BASE) MCG/ACT inhaler  PLAN:  Orders Placed This Encounter  Procedures  . DG Chest 2 View    Standing Status: Future     Number of Occurrences: 1     Standing Expiration Date: 12/15/2014    Order Specific Question:  Reason for Exam (SYMPTOM  OR DIAGNOSIS REQUIRED)    Answer:  cough    Order Specific Question:  Preferred imaging location?    Answer:  Internal  [facility] reading (PRIMARY) by  Dr. Jacelyn Grip: poor inspiration, increased bibasilar markings from poor inspiration and bronchitis.                                   Meds ordered this encounter  Medications  . amoxicillin (AMOXIL) 875 MG tablet    Sig: Take 1 tablet (875 mg total) by mouth 2 (two) times daily.    Dispense:  20 tablet    Refill:  0  . albuterol (PROVENTIL HFA;VENTOLIN HFA) 108 (90 BASE) MCG/ACT inhaler    Sig: Inhale 2 puffs into the lungs every 6 (six) hours as needed for wheezing or shortness of breath.    Dispense:  1 Inhaler    Refill:  0   There are no discontinued medications. Return if symptoms worsen or fail to improve. Also when he comes back from his mission trip in April, he will be due for  Routine follow up.   Farheen Pfahler P. Jacelyn Grip, M.D.

## 2013-10-26 ENCOUNTER — Other Ambulatory Visit: Payer: Self-pay | Admitting: Family Medicine

## 2013-10-27 NOTE — Telephone Encounter (Signed)
Patient seen in office on 10-15-13 and rxd this med. Please advise on RF

## 2013-10-28 DIAGNOSIS — M1710 Unilateral primary osteoarthritis, unspecified knee: Secondary | ICD-10-CM | POA: Diagnosis not present

## 2013-10-30 NOTE — Telephone Encounter (Signed)
Patient needs to be seen. Refill denied. Bring all medications at next office visit. 

## 2013-11-12 DIAGNOSIS — H02429 Myogenic ptosis of unspecified eyelid: Secondary | ICD-10-CM | POA: Diagnosis not present

## 2013-11-13 ENCOUNTER — Encounter: Payer: Self-pay | Admitting: Family Medicine

## 2013-11-13 ENCOUNTER — Ambulatory Visit (INDEPENDENT_AMBULATORY_CARE_PROVIDER_SITE_OTHER): Payer: Medicare Other | Admitting: Family Medicine

## 2013-11-13 VITALS — BP 139/76 | HR 76 | Temp 98.6°F | Ht 69.0 in | Wt 338.0 lb

## 2013-11-13 DIAGNOSIS — E785 Hyperlipidemia, unspecified: Secondary | ICD-10-CM | POA: Diagnosis not present

## 2013-11-13 DIAGNOSIS — J019 Acute sinusitis, unspecified: Secondary | ICD-10-CM

## 2013-11-13 DIAGNOSIS — I10 Essential (primary) hypertension: Secondary | ICD-10-CM | POA: Diagnosis not present

## 2013-11-13 DIAGNOSIS — R7303 Prediabetes: Secondary | ICD-10-CM

## 2013-11-13 DIAGNOSIS — E8881 Metabolic syndrome: Secondary | ICD-10-CM

## 2013-11-13 DIAGNOSIS — R7309 Other abnormal glucose: Secondary | ICD-10-CM

## 2013-11-13 LAB — POCT GLYCOSYLATED HEMOGLOBIN (HGB A1C): Hemoglobin A1C: 5.8

## 2013-11-13 MED ORDER — FLUTICASONE PROPIONATE 50 MCG/ACT NA SUSP: 2.0000 | Freq: Every day | NASAL | Status: DC

## 2013-11-13 MED ORDER — CEFDINIR 300 MG PO CAPS: 300.0000 mg | ORAL_CAPSULE | Freq: Two times a day (BID) | ORAL | Status: DC

## 2013-11-13 NOTE — Progress Notes (Signed)
Patient ID: Ryan Garner, male   DOB: [birth year], 68 y.o.   MRN: [id number] SUBJECTIVE: CC: Chief Complaint  Patient presents with  . Follow-up    3 month follow up chornic problems c/o sinus infection    HPI: Patient is here for follow up of hyperlipidemia/HTN/preDM/obesity/: denies Headache;denies Chest Pain;denies weakness;denies Shortness of Breath and orthopnea;denies Visual changes;denies palpitations;denies cough;denies pedal edema;denies symptoms of TIA or stroke;deniesClaudication symptoms. admits to Compliance with medications; denies Problems with medications.  Having ongoing sinus symptoms of pain and nasal conmgestion. He says that it usually requires 2 rounds of antibiotics. No fever. Cheeks are sore.  Past Medical History  Diagnosis Date  . Metabolic syndrome   . Hypertension   . Hyperlipidemia   . Prediabetes   . Metabolic syndrome    Past Surgical History  Procedure Laterality Date  . Hernia repair    . Tonsillectomy    . Rotator cuff repair Bilateral    History   Social History  . Marital Status: Married    Spouse Name: N/A    Number of Children: N/A  . Years of Education: N/A   Occupational History  . Not on file.   Social History Main Topics  . Smoking status: Former Research scientist (life sciences)  . Smokeless tobacco: Never Used  . Alcohol Use: No  . Drug Use: No  . Sexual Activity: Not on file   Other Topics Concern  . Not on file   Social History Narrative  . No narrative on file   Family History  Problem Relation Age of Onset  . Cancer Mother     lungs  . Coronary artery disease Mother   . Diabetes Brother    Current Outpatient Prescriptions on File Prior to Visit  Medication Sig Dispense Refill  . albuterol (PROVENTIL HFA;VENTOLIN HFA) 108 (90 BASE) MCG/ACT inhaler Inhale 2 puffs into the lungs every 6 (six) hours as needed for wheezing or shortness of breath.  1 Inhaler  0  . aspirin EC 81 MG tablet Take 1 tablet (81 mg total) by mouth daily.  30 tablet   11  . lisinopril-hydrochlorothiazide (PRINZIDE,ZESTORETIC) 10-12.5 MG per tablet TAKE ONE TABLET BY MOUTH ONE TIME DAILY  90 tablet  3  . pravastatin (PRAVACHOL) 10 MG tablet Take 1 tablet (10 mg total) by mouth daily.  30 tablet  3   No current facility-administered medications on file prior to visit.   No Known Allergies Immunization History  Administered Date(s) Administered  . Pneumococcal Polysaccharide-23 08/07/2013  . Tetanus 07/23/2006   Prior to Admission medications   Medication Sig Start Date End Date Taking? Authorizing Provider  albuterol (PROVENTIL HFA;VENTOLIN HFA) 108 (90 BASE) MCG/ACT inhaler Inhale 2 puffs into the lungs every 6 (six) hours as needed for wheezing or shortness of breath. 10/15/13  Yes Vernie Shanks, MD  aspirin EC 81 MG tablet Take 1 tablet (81 mg total) by mouth daily. 08/09/13  Yes Vernie Shanks, MD  lisinopril-hydrochlorothiazide (PRINZIDE,ZESTORETIC) 10-12.5 MG per tablet TAKE ONE TABLET BY MOUTH ONE TIME DAILY 08/07/13  Yes Vernie Shanks, MD  pravastatin (PRAVACHOL) 10 MG tablet Take 1 tablet (10 mg total) by mouth daily. 08/09/13  Yes Vernie Shanks, MD  amoxicillin (AMOXIL) 875 MG tablet Take 1 tablet (875 mg total) by mouth 2 (two) times daily. 10/15/13   Vernie Shanks, MD  doxycycline (VIBRA-TABS) 100 MG tablet Take one tablet twice a day on day one, then take one tablet daily for next 14 days  12/31/12   Erby Pian, FNP     ROS: As above in the HPI. All other systems are stable or negative.  OBJECTIVE: APPEARANCE:  Patient in no acute distress.The patient appeared well nourished and normally developed. Acyanotic. Waist: VITAL SIGNS:BP 139/76  Pulse 76  Temp(Src) 98.6 F (37 C) (Oral)  Ht _0  (1.753 m)  Wt 338 lb (153.316 kg)  BMI 49.89 kg/m2  Morbidly obese WM  SKIN: warm and  Dry without overt rashes, tattoos and scars  HEAD and Neck: without JVD, Head and scalp: normal Eyes:No scleral icterus. Fundi normal, eye movements  normal. Ears: Auricle normal, canal normal, Tympanic membranes normal, insufflation normal. Nose: nasal congestion.paranasal tenderness. Boggy swollen nasal mucosa. Throat: normal Neck & thyroid: normal  CHEST & LUNGS: Chest wall: normal Lungs: Clear  CVS: Reveals the PMI to be normally located. Regular rhythm, First and Second Heart sounds are normal,  absence of murmurs, rubs or gallops. Peripheral vasculature: Radial pulses: normal Dorsal pedis pulses: normal Posterior pulses: normal  ABDOMEN:  Appearance: obese Benign, no organomegaly, no masses, no Abdominal Aortic enlargement. No Guarding , no rebound. No Bruits. Bowel sounds: normal  RECTAL: N/A GU: N/A  EXTREMETIES: nonedematous.  MUSCULOSKELETAL:  Spine: normal Joints: intact  NEUROLOGIC: oriented to time,place and person; nonfocal. Strength is normal Sensory is normal Reflexes are normal Cranial Nerves are normal.  Results for orders placed in visit on 08/11/13  FECAL OCCULT BLOOD, IMMUNOCHEMICAL      Result Value Ref Range   Fecal Occult Bld Negative  Negative    ASSESSMENT:  Prediabetes - Plan: POCT glycosylated hemoglobin (Hb T6I)  Metabolic syndrome - Plan: CMP14+EGFR  Hypertension - Plan: CMP14+EGFR  Hyperlipidemia - Plan: NMR, lipoprofile  Sinusitis, acute - Plan: cefdinir (OMNICEF) 300 MG capsule, fluticasone (FLONASE) 50 MCG/ACT nasal spray  PLAN:  Reinforced or previous discussions on LTC: dietary choice changes, fruits and veggies and less animal products.; exercise 5 x per week 150 hours. Await labs.  Orders Placed This Encounter  Procedures  . CMP14+EGFR  . NMR, lipoprofile  . POCT glycosylated hemoglobin (Hb A1C)   Meds ordered this encounter  Medications  . cefdinir (OMNICEF) 300 MG capsule    Sig: Take 1 capsule (300 mg total) by mouth 2 (two) times daily.    Dispense:  20 capsule    Refill:  0  . fluticasone (FLONASE) 50 MCG/ACT nasal spray    Sig: Place 2 sprays  into both nostrils daily.    Dispense:  16 g    Refill:  3   Medications Discontinued During This Encounter  Medication Reason  . amoxicillin (AMOXIL) 875 MG tablet Completed Course  . doxycycline (VIBRA-TABS) 100 MG tablet Completed Course   Return in about 4 months (around 03/15/2014) for Recheck medical problems.  Kenichi Cassada P. Jacelyn Grip, M.D.

## 2013-11-14 LAB — CMP14+EGFR
ALT: 22 IU/L (ref 0–44)
AST: 20 IU/L (ref 0–40)
Albumin/Globulin Ratio: 1.7 (ref 1.1–2.5)
Albumin: 4 g/dL (ref 3.6–4.8)
Alkaline Phosphatase: 73 IU/L (ref 39–117)
BUN/Creatinine Ratio: 19 (ref 10–22)
BUN: 19 mg/dL (ref 8–27)
CO2: 27 mmol/L (ref 18–29)
Calcium: 9.9 mg/dL (ref 8.6–10.2)
Chloride: 100 mmol/L (ref 97–108)
Creatinine, Ser: 1.02 mg/dL (ref 0.76–1.27)
GFR calc Af Amer: 86 mL/min/{1.73_m2} (ref >59)
GFR calc non Af Amer: 75 mL/min/{1.73_m2} (ref >59)
Globulin, Total: 2.4 g/dL (ref 1.5–4.5)
Glucose: 114 mg/dL — ABNORMAL HIGH (ref 65–99)
Potassium: 5 mmol/L (ref 3.5–5.2)
Sodium: 141 mmol/L (ref 134–144)
Total Bilirubin: 0.2 mg/dL (ref 0.0–1.2)
Total Protein: 6.4 g/dL (ref 6.0–8.5)

## 2013-11-14 LAB — NMR, LIPOPROFILE
Cholesterol: 154 mg/dL (ref <200)
HDL Cholesterol by NMR: 43 mg/dL (ref >=40)
HDL Particle Number: 34.9 umol/L (ref >=30.5)
LDL Particle Number: 1129 nmol/L — ABNORMAL HIGH (ref <1000)
LDL Size: 20.5 nm (ref >20.5)
LDLC SERPL CALC-MCNC: 78 mg/dL (ref <100)
LP-IR Score: 72 — ABNORMAL HIGH (ref <=45)
Small LDL Particle Number: 710 nmol/L — ABNORMAL HIGH (ref <=527)
Triglycerides by NMR: 163 mg/dL — ABNORMAL HIGH (ref <150)

## 2013-11-15 NOTE — Progress Notes (Signed)
Quick Note:  Call Patient Labs that are abnormal: Pre-diabetic with the HGBA1C at 5.8 Triglycerides a little hig The rest are at goal  Recommendations: Plant based Diet as we discussed and weight loss will correct this. No change in medications   ______

## 2014-01-12 DIAGNOSIS — H02839 Dermatochalasis of unspecified eye, unspecified eyelid: Secondary | ICD-10-CM | POA: Diagnosis not present

## 2014-01-12 DIAGNOSIS — H02429 Myogenic ptosis of unspecified eyelid: Secondary | ICD-10-CM | POA: Diagnosis not present

## 2014-01-14 ENCOUNTER — Ambulatory Visit (INDEPENDENT_AMBULATORY_CARE_PROVIDER_SITE_OTHER): Payer: Medicare Other | Admitting: Family Medicine

## 2014-01-14 ENCOUNTER — Encounter: Payer: Self-pay | Admitting: Family Medicine

## 2014-01-14 VITALS — BP 143/80 | HR 72 | Temp 98.1°F | Ht 69.0 in | Wt 336.0 lb

## 2014-01-14 DIAGNOSIS — L259 Unspecified contact dermatitis, unspecified cause: Secondary | ICD-10-CM

## 2014-01-14 DIAGNOSIS — L309 Dermatitis, unspecified: Secondary | ICD-10-CM

## 2014-01-14 MED ORDER — METHYLPREDNISOLONE ACETATE 80 MG/ML IJ SUSP
80.0000 mg | Freq: Once | INTRAMUSCULAR | Status: AC
  Administered 2014-01-14: 80 mg via INTRAMUSCULAR

## 2014-01-14 MED ORDER — METHYLPREDNISOLONE (PAK) 4 MG PO TABS: ORAL_TABLET | ORAL | Status: DC

## 2014-01-14 NOTE — Progress Notes (Signed)
   Subjective:    Patient ID: Ryan Garner, male    DOB: [birth year], 68 y.o.   MRN: [id number]  HPI  C/o poison ivy dermatitis on chest, arms, and neck.  Review of Systems C/o rash No chest pain, SOB, HA, dizziness, vision change, N/V, diarrhea, constipation, dysuria, urinary urgency or frequency, myalgias, arthralgias.     Objective:   Physical Exam   Vital signs noted  Well developed well nourished male.  HEENT - Head atraumatic Normocephalic Respiratory - Lungs CTA bilateral Cardiac - RRR S1 and S2 without murmur Skin - Erythematous rash over chest, arms, and neck     Assessment & Plan:  Dermatitis - Plan: methylPREDNIsolone (MEDROL DOSPACK) 4 MG tablet, methylPREDNISolone acetate (DEPO-MEDROL) injection 80 mg  Lysbeth Penner FNP

## 2014-02-17 DIAGNOSIS — L570 Actinic keratosis: Secondary | ICD-10-CM | POA: Diagnosis not present

## 2014-02-17 DIAGNOSIS — L819 Disorder of pigmentation, unspecified: Secondary | ICD-10-CM | POA: Diagnosis not present

## 2014-02-17 DIAGNOSIS — D235 Other benign neoplasm of skin of trunk: Secondary | ICD-10-CM | POA: Diagnosis not present

## 2014-02-17 DIAGNOSIS — D485 Neoplasm of uncertain behavior of skin: Secondary | ICD-10-CM | POA: Diagnosis not present

## 2014-03-02 DIAGNOSIS — M4310 Spondylolisthesis, site unspecified: Secondary | ICD-10-CM | POA: Diagnosis not present

## 2014-03-02 DIAGNOSIS — M48061 Spinal stenosis, lumbar region without neurogenic claudication: Secondary | ICD-10-CM | POA: Diagnosis not present

## 2014-03-02 DIAGNOSIS — M47817 Spondylosis without myelopathy or radiculopathy, lumbosacral region: Secondary | ICD-10-CM | POA: Diagnosis not present

## 2014-03-02 DIAGNOSIS — M5137 Other intervertebral disc degeneration, lumbosacral region: Secondary | ICD-10-CM | POA: Diagnosis not present

## 2014-04-01 DIAGNOSIS — M4310 Spondylolisthesis, site unspecified: Secondary | ICD-10-CM | POA: Diagnosis not present

## 2014-04-01 DIAGNOSIS — M5137 Other intervertebral disc degeneration, lumbosacral region: Secondary | ICD-10-CM | POA: Diagnosis not present

## 2014-04-01 DIAGNOSIS — M47817 Spondylosis without myelopathy or radiculopathy, lumbosacral region: Secondary | ICD-10-CM | POA: Diagnosis not present

## 2014-04-20 DIAGNOSIS — M1710 Unilateral primary osteoarthritis, unspecified knee: Secondary | ICD-10-CM | POA: Diagnosis not present

## 2014-04-20 DIAGNOSIS — M67919 Unspecified disorder of synovium and tendon, unspecified shoulder: Secondary | ICD-10-CM | POA: Diagnosis not present

## 2014-04-20 DIAGNOSIS — M7582 Other shoulder lesions, left shoulder: Secondary | ICD-10-CM | POA: Insufficient documentation

## 2014-05-18 DIAGNOSIS — M170 Bilateral primary osteoarthritis of knee: Secondary | ICD-10-CM | POA: Diagnosis not present

## 2014-05-19 DIAGNOSIS — L570 Actinic keratosis: Secondary | ICD-10-CM | POA: Diagnosis not present

## 2014-05-19 DIAGNOSIS — L578 Other skin changes due to chronic exposure to nonionizing radiation: Secondary | ICD-10-CM | POA: Diagnosis not present

## 2014-07-28 DIAGNOSIS — L570 Actinic keratosis: Secondary | ICD-10-CM | POA: Diagnosis not present

## 2014-08-10 ENCOUNTER — Other Ambulatory Visit: Payer: Self-pay | Admitting: *Deleted

## 2014-08-10 MED ORDER — LISINOPRIL-HYDROCHLOROTHIAZIDE 10-12.5 MG PO TABS: ORAL_TABLET | ORAL | Status: DC

## 2014-08-11 ENCOUNTER — Telehealth: Payer: Self-pay | Admitting: Family Medicine

## 2014-08-19 DIAGNOSIS — M47816 Spondylosis without myelopathy or radiculopathy, lumbar region: Secondary | ICD-10-CM | POA: Diagnosis not present

## 2014-08-19 DIAGNOSIS — M4806 Spinal stenosis, lumbar region: Secondary | ICD-10-CM | POA: Diagnosis not present

## 2014-08-19 DIAGNOSIS — M5136 Other intervertebral disc degeneration, lumbar region: Secondary | ICD-10-CM | POA: Diagnosis not present

## 2014-08-19 DIAGNOSIS — M4316 Spondylolisthesis, lumbar region: Secondary | ICD-10-CM | POA: Diagnosis not present

## 2014-08-26 ENCOUNTER — Encounter: Payer: Self-pay | Admitting: Family Medicine

## 2014-08-26 ENCOUNTER — Ambulatory Visit (INDEPENDENT_AMBULATORY_CARE_PROVIDER_SITE_OTHER): Payer: Medicare Other | Admitting: Family Medicine

## 2014-08-26 VITALS — BP 144/80 | HR 76 | Temp 97.6°F | Ht 69.0 in | Wt 338.0 lb

## 2014-08-26 DIAGNOSIS — E785 Hyperlipidemia, unspecified: Secondary | ICD-10-CM

## 2014-08-26 DIAGNOSIS — R7309 Other abnormal glucose: Secondary | ICD-10-CM | POA: Diagnosis not present

## 2014-08-26 DIAGNOSIS — Z23 Encounter for immunization: Secondary | ICD-10-CM

## 2014-08-26 DIAGNOSIS — I10 Essential (primary) hypertension: Secondary | ICD-10-CM | POA: Diagnosis not present

## 2014-08-26 DIAGNOSIS — R7303 Prediabetes: Secondary | ICD-10-CM

## 2014-08-26 MED ORDER — PNEUMOCOCCAL 13-VAL CONJ VACC IM SUSP: 0.5000 mL | INTRAMUSCULAR | Status: DC

## 2014-08-26 NOTE — Progress Notes (Signed)
Subjective:    Patient ID: Ryan Garner, male    DOB: May 18, 1946, 69 y.o.   MRN: [id number]  HPI Comments: Patient in for follow-up of elevated cholesterol. Doing well without complaints on no medication.  Currently no chest pain, shortness of breath or other cardiovascular related symptoms noted.  Diabetes He presents for his follow-up diabetic visit. Diabetes type: Prediabetic. No MedicAlert identification noted. The initial diagnosis of diabetes was made 2 years ago. His disease course has been stable. There are no hypoglycemic associated symptoms. Pertinent negatives for hypoglycemia include no dizziness, headaches or nervousness/anxiousness. Pertinent negatives for diabetes include no polyphagia, no polyuria and no weight loss. There are no hypoglycemic complications. Symptoms are stable. There are no diabetic complications. Risk factors for coronary artery disease include hypertension, obesity, dyslipidemia and family history. Current diabetic treatment includes diet. His weight is fluctuating minimally. He is following a generally healthy diet. Meal planning includes avoidance of concentrated sweets. He has not had a previous visit with a dietitian. He participates in exercise intermittently. An ACE inhibitor/angiotensin II receptor blocker is not being taken.    Patient is here today for a follow up of hypertension.      Patient in for follow-up of hypertension. Patient has no history of headache chest pain or shortness of breath or recent cough. Patient also denies symptoms of TIA such as numbness weakness lateralizing. Patient checks  blood pressure at home and has not had any elevated readings recently. Patient denies side effects from his medication. States taking it regularly.    No Known Allergies  Outpatient Encounter Prescriptions as of 08/26/2014  Medication Sig  . lisinopril-hydrochlorothiazide (PRINZIDE,ZESTORETIC) 10-12.5 MG per tablet TAKE ONE TABLET BY MOUTH ONE TIME DAILY    . [DISCONTINUED] albuterol (PROVENTIL HFA;VENTOLIN HFA) 108 (90 BASE) MCG/ACT inhaler Inhale 2 puffs into the lungs every 6 (six) hours as needed for wheezing or shortness of breath. (Patient not taking: Reported on 08/26/2014)  . [DISCONTINUED] aspirin EC 81 MG tablet Take 1 tablet (81 mg total) by mouth daily. (Patient not taking: Reported on 08/26/2014)  . [DISCONTINUED] fluticasone (FLONASE) 50 MCG/ACT nasal spray Place 2 sprays into both nostrils daily. (Patient not taking: Reported on 08/26/2014)  . [DISCONTINUED] methylPREDNIsolone (MEDROL DOSPACK) 4 MG tablet follow package directions (Patient not taking: Reported on 08/26/2014)  . [DISCONTINUED] pravastatin (PRAVACHOL) 10 MG tablet Take 1 tablet (10 mg total) by mouth daily. (Patient not taking: Reported on 08/26/2014)    Past Medical History  Diagnosis Date  . Metabolic syndrome   . Hypertension   . Hyperlipidemia   . Prediabetes   . Metabolic syndrome     Past Surgical History  Procedure Laterality Date  . Hernia repair    . Tonsillectomy    . Rotator cuff repair Bilateral   . Eyelid surgery Bilateral     History   Social History  . Marital Status: Married    Spouse Name: N/A    Number of Children: N/A  . Years of Education: N/A   Occupational History  . Not on file.   Social History Main Topics  . Smoking status: Former Research scientist (life sciences)  . Smokeless tobacco: Never Used  . Alcohol Use: No  . Drug Use: No  . Sexual Activity: Not on file   Other Topics Concern  . Not on file   Social History Narrative      Review of Systems  Constitutional: Negative for fever, chills, weight loss, diaphoresis and unexpected weight change.  HENT: Negative  for congestion, hearing loss, rhinorrhea, sore throat and trouble swallowing.   Respiratory: Negative for cough, chest tightness, shortness of breath and wheezing.   Gastrointestinal: Negative for nausea, vomiting, abdominal pain, diarrhea, constipation and abdominal distention.   Endocrine: Negative for cold intolerance, heat intolerance, polyphagia and polyuria.  Genitourinary: Negative for dysuria, hematuria and flank pain.  Musculoskeletal: Negative for joint swelling and arthralgias.  Skin: Negative for rash.  Neurological: Negative for dizziness and headaches.  Psychiatric/Behavioral: Negative for dysphoric mood, decreased concentration and agitation. The patient is not nervous/anxious.        Objective:   Physical Exam  Constitutional: He is oriented to person, place, and time. He appears well-developed and well-nourished. No distress.  HENT:  Head: Normocephalic and atraumatic.  Right Ear: External ear normal.  Left Ear: External ear normal.  Nose: Nose normal.  Mouth/Throat: Oropharynx is clear and moist.  Eyes: Conjunctivae and EOM are normal. Pupils are equal, round, and reactive to light.  Neck: Normal range of motion. Neck supple. No thyromegaly present.  Cardiovascular: Normal rate, regular rhythm and normal heart sounds.   No murmur heard. Pulmonary/Chest: Effort normal and breath sounds normal. No respiratory distress. He has no wheezes. He has no rales.  Abdominal: Soft. Bowel sounds are normal. He exhibits no distension. There is no tenderness.  Lymphadenopathy:    He has no cervical adenopathy.  Neurological: He is alert and oriented to person, place, and time. He has normal reflexes.  Skin: Skin is warm and dry.  Psychiatric: He has a normal mood and affect. His behavior is normal. Judgment and thought content normal.   BP 144/80 mmHg  Pulse 76  Temp(Src) 97.6 F (36.4 C) (Oral)  Ht _0  (1.753 m)  Wt 338 lb (153.316 kg)  BMI 49.89 kg/m2        Assessment & Plan:   1. Prediabetes   2. Essential hypertension   3. Hyperlipidemia   4. Need for vaccination     Meds ordered this encounter  Medications  . pneumococcal 13-valent conjugate vaccine (PREVNAR 13) injection 0.5 mL    Sig:     Orders Placed This Encounter   Procedures  . Pneumococcal conjugate vaccine 13-valent IM  . NMR, lipoprofile  . CMP14+EGFR  . POCT glycosylated hemoglobin (Hb A1C)  . POCT urinalysis dipstick    Labs pending Health Maintenance reviewed Diet and exercise encouraged Continue all meds as discussed Follow up in 6 mos  Claretta Fraise, MD

## 2014-08-26 NOTE — Patient Instructions (Signed)
DASH Eating Plan °DASH stands for "Dietary Approaches to Stop Hypertension." The DASH eating plan is a healthy eating plan that has been shown to reduce high blood pressure (hypertension). Additional health benefits may include reducing the risk of type 2 diabetes mellitus, heart disease, and stroke. The DASH eating plan may also help with weight loss. °WHAT DO I NEED TO KNOW ABOUT THE DASH EATING PLAN? °For the DASH eating plan, you will follow these general guidelines: °· Choose foods with a percent daily value for sodium of less than 5% (as listed on the food label). °· Use salt-free seasonings or herbs instead of table salt or sea salt. °· Check with your health care provider or pharmacist before using salt substitutes. °· Eat lower-sodium products, often labeled as "lower sodium" or "no salt added." °· Eat fresh foods. °· Eat more vegetables, fruits, and low-fat dairy products. °· Choose whole grains. Look for the word "whole" as the first word in the ingredient list. °· Choose fish and skinless chicken or turkey more often than red meat. Limit fish, poultry, and meat to 6 oz (170 g) each day. °· Limit sweets, desserts, sugars, and sugary drinks. °· Choose heart-healthy fats. °· Limit cheese to 1 oz (28 g) per day. °· Eat more home-cooked food and less restaurant, buffet, and fast food. °· Limit fried foods. °· Cook foods using methods other than frying. °· Limit canned vegetables. If you do use them, rinse them well to decrease the sodium. °· When eating at a restaurant, ask that your food be prepared with less salt, or no salt if possible. °WHAT FOODS CAN I EAT? °Seek help from a dietitian for individual calorie needs. °Grains °Whole grain or whole wheat bread. Brown rice. Whole grain or whole wheat pasta. Quinoa, bulgur, and whole grain cereals. Low-sodium cereals. Corn or whole wheat flour tortillas. Whole grain cornbread. Whole grain crackers. Low-sodium crackers. °Vegetables °Fresh or frozen vegetables  (raw, steamed, roasted, or grilled). Low-sodium or reduced-sodium tomato and vegetable juices. Low-sodium or reduced-sodium tomato sauce and paste. Low-sodium or reduced-sodium canned vegetables.  °Fruits °All fresh, canned (in natural juice), or frozen fruits. °Meat and Other Protein Products °Ground beef (85% or leaner), grass-fed beef, or beef trimmed of fat. Skinless chicken or turkey. Ground chicken or turkey. Pork trimmed of fat. All fish and seafood. Eggs. Dried beans, peas, or lentils. Unsalted nuts and seeds. Unsalted canned beans. °Dairy °Low-fat dairy products, such as skim or 1% milk, 2% or reduced-fat cheeses, low-fat ricotta or cottage cheese, or plain low-fat yogurt. Low-sodium or reduced-sodium cheeses. °Fats and Oils °Tub margarines without trans fats. Light or reduced-fat mayonnaise and salad dressings (reduced sodium). Avocado. Safflower, olive, or canola oils. Natural peanut or almond butter. °Other °Unsalted popcorn and pretzels. °The items listed above may not be a complete list of recommended foods or beverages. Contact your dietitian for more options. °WHAT FOODS ARE NOT RECOMMENDED? °Grains °White bread. White pasta. White rice. Refined cornbread. Bagels and croissants. Crackers that contain trans fat. °Vegetables °Creamed or fried vegetables. Vegetables in a cheese sauce. Regular canned vegetables. Regular canned tomato sauce and paste. Regular tomato and vegetable juices. °Fruits °Dried fruits. Canned fruit in light or heavy syrup. Fruit juice. °Meat and Other Protein Products °Fatty cuts of meat. Ribs, chicken wings, bacon, sausage, bologna, salami, chitterlings, fatback, hot dogs, bratwurst, and packaged luncheon meats. Salted nuts and seeds. Canned beans with salt. °Dairy °Whole or 2% milk, cream, half-and-half, and cream cheese. Whole-fat or sweetened yogurt. Full-fat   cheeses or blue cheese. Nondairy creamers and whipped toppings. Processed cheese, cheese spreads, or cheese  curds. °Condiments °Onion and garlic salt, seasoned salt, table salt, and sea salt. Canned and packaged gravies. Worcestershire sauce. Tartar sauce. Barbecue sauce. Teriyaki sauce. Soy sauce, including reduced sodium. Steak sauce. Fish sauce. Oyster sauce. Cocktail sauce. Horseradish. Ketchup and mustard. Meat flavorings and tenderizers. Bouillon cubes. Hot sauce. Tabasco sauce. Marinades. Taco seasonings. Relishes. °Fats and Oils °Butter, stick margarine, lard, shortening, ghee, and bacon fat. Coconut, palm kernel, or palm oils. Regular salad dressings. °Other °Pickles and olives. Salted popcorn and pretzels. °The items listed above may not be a complete list of foods and beverages to avoid. Contact your dietitian for more information. °WHERE CAN I FIND MORE INFORMATION? °National Heart, Lung, and [employer] Institute: www.nhlbi.nih.gov/health/health-topics/topics/dash/ °Document Released: 06/28/2011 Document Revised: 11/23/2013 Document Reviewed: 05/13/2013 °ExitCare® Patient Information ©2015 [employer]. This information is not intended to replace advice given to you by your health care provider. Make sure you discuss any questions you have with your health care provider. ° °

## 2014-08-27 ENCOUNTER — Other Ambulatory Visit: Payer: Medicare Other

## 2014-08-27 DIAGNOSIS — I10 Essential (primary) hypertension: Secondary | ICD-10-CM | POA: Diagnosis not present

## 2014-08-27 DIAGNOSIS — R7309 Other abnormal glucose: Secondary | ICD-10-CM | POA: Diagnosis not present

## 2014-08-27 DIAGNOSIS — E785 Hyperlipidemia, unspecified: Secondary | ICD-10-CM | POA: Diagnosis not present

## 2014-08-27 LAB — POCT URINALYSIS DIPSTICK
Bilirubin, UA: NEGATIVE
Blood, UA: NEGATIVE
Glucose, UA: NEGATIVE
Ketones, UA: NEGATIVE
Leukocytes, UA: NEGATIVE
Nitrite, UA: NEGATIVE
Protein, UA: NEGATIVE
Spec Grav, UA: 1.015
Urobilinogen, UA: NEGATIVE
pH, UA: 6

## 2014-08-27 LAB — POCT GLYCOSYLATED HEMOGLOBIN (HGB A1C): Hemoglobin A1C: 6

## 2014-08-27 NOTE — Progress Notes (Signed)
Lab work only for 08/26/14

## 2014-08-27 NOTE — Addendum Note (Signed)
Addended by: Earlene Plater on: 08/27/2014 08:27 AM   Modules accepted: Miquel Dunn

## 2014-08-28 LAB — CMP14+EGFR
ALT: 15 IU/L (ref 0–44)
AST: 7 IU/L (ref 0–40)
Albumin/Globulin Ratio: 2.8 — ABNORMAL HIGH (ref 1.1–2.5)
Albumin: 4.4 g/dL (ref 3.5–4.8)
Alkaline Phosphatase: 72 IU/L (ref 39–117)
BUN/Creatinine Ratio: 23 — ABNORMAL HIGH (ref 10–22)
BUN: 23 mg/dL (ref 8–27)
CO2: 23 mmol/L (ref 18–29)
Calcium: 9.8 mg/dL (ref 8.6–10.2)
Chloride: 99 mmol/L (ref 97–108)
Creatinine, Ser: 1 mg/dL (ref 0.76–1.27)
GFR calc Af Amer: 88 mL/min/{1.73_m2} (ref >59)
GFR calc non Af Amer: 76 mL/min/{1.73_m2} (ref >59)
Globulin, Total: 1.6 g/dL (ref 1.5–4.5)
Glucose: 110 mg/dL — ABNORMAL HIGH (ref 65–99)
Potassium: 5.3 mmol/L — ABNORMAL HIGH (ref 3.5–5.2)
Sodium: 136 mmol/L (ref 134–144)
Total Bilirubin: 0.4 mg/dL (ref 0.0–1.2)
Total Protein: 6 g/dL (ref 6.0–8.5)

## 2014-08-28 LAB — NMR, LIPOPROFILE
Cholesterol: 192 mg/dL (ref 100–199)
HDL Cholesterol by NMR: 56 mg/dL (ref >39)
HDL Particle Number: 39.7 umol/L (ref >=30.5)
LDL Particle Number: 1458 nmol/L — ABNORMAL HIGH (ref <1000)
LDL Size: 21.4 nm (ref >20.5)
LDL-C: 121 mg/dL — ABNORMAL HIGH (ref 0–99)
LP-IR Score: 47 — ABNORMAL HIGH (ref <=45)
Small LDL Particle Number: 264 nmol/L (ref <=527)
Triglycerides by NMR: 74 mg/dL (ref 0–149)

## 2014-09-23 DIAGNOSIS — M25561 Pain in right knee: Secondary | ICD-10-CM | POA: Diagnosis not present

## 2014-10-05 DIAGNOSIS — M5136 Other intervertebral disc degeneration, lumbar region: Secondary | ICD-10-CM | POA: Diagnosis not present

## 2014-10-05 DIAGNOSIS — M4806 Spinal stenosis, lumbar region: Secondary | ICD-10-CM | POA: Diagnosis not present

## 2014-10-05 DIAGNOSIS — M4316 Spondylolisthesis, lumbar region: Secondary | ICD-10-CM | POA: Diagnosis not present

## 2014-10-05 DIAGNOSIS — M4726 Other spondylosis with radiculopathy, lumbar region: Secondary | ICD-10-CM | POA: Diagnosis not present

## 2014-10-05 DIAGNOSIS — Z6841 Body Mass Index (BMI) 40.0 and over, adult: Secondary | ICD-10-CM | POA: Diagnosis not present

## 2014-10-16 DIAGNOSIS — M25461 Effusion, right knee: Secondary | ICD-10-CM | POA: Diagnosis not present

## 2014-10-16 DIAGNOSIS — S83241A Other tear of medial meniscus, current injury, right knee, initial encounter: Secondary | ICD-10-CM | POA: Diagnosis not present

## 2014-10-16 DIAGNOSIS — S83281A Other tear of lateral meniscus, current injury, right knee, initial encounter: Secondary | ICD-10-CM | POA: Diagnosis not present

## 2014-10-16 DIAGNOSIS — M25561 Pain in right knee: Secondary | ICD-10-CM | POA: Diagnosis not present

## 2014-10-27 DIAGNOSIS — S83241A Other tear of medial meniscus, current injury, right knee, initial encounter: Secondary | ICD-10-CM | POA: Diagnosis not present

## 2014-10-27 DIAGNOSIS — M1711 Unilateral primary osteoarthritis, right knee: Secondary | ICD-10-CM | POA: Diagnosis not present

## 2014-11-01 ENCOUNTER — Other Ambulatory Visit: Payer: Self-pay | Admitting: *Deleted

## 2014-11-01 MED ORDER — LISINOPRIL-HYDROCHLOROTHIAZIDE 10-12.5 MG PO TABS: ORAL_TABLET | ORAL | Status: DC

## 2014-11-17 DIAGNOSIS — M1711 Unilateral primary osteoarthritis, right knee: Secondary | ICD-10-CM | POA: Diagnosis not present

## 2014-11-17 DIAGNOSIS — Z888 Allergy status to other drugs, medicaments and biological substances status: Secondary | ICD-10-CM | POA: Diagnosis not present

## 2014-11-17 DIAGNOSIS — I10 Essential (primary) hypertension: Secondary | ICD-10-CM | POA: Diagnosis not present

## 2014-11-17 DIAGNOSIS — M179 Osteoarthritis of knee, unspecified: Secondary | ICD-10-CM | POA: Diagnosis not present

## 2014-11-17 DIAGNOSIS — S83241D Other tear of medial meniscus, current injury, right knee, subsequent encounter: Secondary | ICD-10-CM | POA: Diagnosis not present

## 2014-11-17 DIAGNOSIS — Z01818 Encounter for other preprocedural examination: Secondary | ICD-10-CM | POA: Diagnosis not present

## 2014-11-19 DIAGNOSIS — I10 Essential (primary) hypertension: Secondary | ICD-10-CM | POA: Diagnosis not present

## 2014-11-19 DIAGNOSIS — G4730 Sleep apnea, unspecified: Secondary | ICD-10-CM | POA: Diagnosis not present

## 2014-11-19 DIAGNOSIS — M1711 Unilateral primary osteoarthritis, right knee: Secondary | ICD-10-CM | POA: Diagnosis not present

## 2014-11-19 DIAGNOSIS — S83281A Other tear of lateral meniscus, current injury, right knee, initial encounter: Secondary | ICD-10-CM | POA: Diagnosis not present

## 2014-11-19 DIAGNOSIS — M94261 Chondromalacia, right knee: Secondary | ICD-10-CM | POA: Diagnosis not present

## 2014-11-19 DIAGNOSIS — S83271A Complex tear of lateral meniscus, current injury, right knee, initial encounter: Secondary | ICD-10-CM | POA: Diagnosis not present

## 2014-11-19 DIAGNOSIS — S83231A Complex tear of medial meniscus, current injury, right knee, initial encounter: Secondary | ICD-10-CM | POA: Diagnosis not present

## 2014-11-19 DIAGNOSIS — S83241A Other tear of medial meniscus, current injury, right knee, initial encounter: Secondary | ICD-10-CM | POA: Diagnosis not present

## 2014-11-19 DIAGNOSIS — Z87891 Personal history of nicotine dependence: Secondary | ICD-10-CM | POA: Diagnosis not present

## 2014-11-19 DIAGNOSIS — Z79899 Other long term (current) drug therapy: Secondary | ICD-10-CM | POA: Diagnosis not present

## 2014-11-26 DIAGNOSIS — L219 Seborrheic dermatitis, unspecified: Secondary | ICD-10-CM | POA: Diagnosis not present

## 2014-11-26 DIAGNOSIS — L570 Actinic keratosis: Secondary | ICD-10-CM | POA: Diagnosis not present

## 2015-03-21 ENCOUNTER — Encounter (INDEPENDENT_AMBULATORY_CARE_PROVIDER_SITE_OTHER): Payer: Self-pay

## 2015-03-21 ENCOUNTER — Encounter: Payer: Self-pay | Admitting: Family Medicine

## 2015-03-21 ENCOUNTER — Ambulatory Visit (INDEPENDENT_AMBULATORY_CARE_PROVIDER_SITE_OTHER): Payer: Medicare Other | Admitting: Family Medicine

## 2015-03-21 VITALS — BP 131/71 | HR 71 | Temp 97.8°F | Ht 69.0 in | Wt 338.3 lb

## 2015-03-21 DIAGNOSIS — L259 Unspecified contact dermatitis, unspecified cause: Secondary | ICD-10-CM

## 2015-03-21 DIAGNOSIS — L299 Pruritus, unspecified: Secondary | ICD-10-CM

## 2015-03-21 MED ORDER — TRIAMCINOLONE ACETONIDE 40 MG/ML IJ SUSP
40.0000 mg | Freq: Once | INTRAMUSCULAR | Status: AC
  Administered 2015-03-21: 40 mg via INTRAMUSCULAR

## 2015-03-21 NOTE — Patient Instructions (Signed)
Great to meet you!  Come back to see [place] as you previously planned.   Poison [employer] ivy is a inflammation of the skin (contact dermatitis) caused by touching the allergens on the leaves of the ivy plant following previous exposure to the plant. The rash usually appears 48 hours after exposure. The rash is usually bumps (papules) or blisters (vesicles) in a linear pattern. Depending on your own sensitivity, the rash may simply cause redness and itching, or it may also progress to blisters which may break open. These must be well cared for to prevent secondary bacterial (germ) infection, followed by scarring. Keep any open areas dry, clean, dressed, and covered with an antibacterial ointment if needed. The eyes may also get puffy. The puffiness is worst in the morning and gets better as the day progresses. This dermatitis usually heals without scarring, within 2 to 3 weeks without treatment. HOME CARE INSTRUCTIONS  Thoroughly wash with soap and water as soon as you have been exposed to poison ivy. You have about one half hour to remove the plant resin before it will cause the rash. This washing will destroy the oil or antigen on the skin that is causing, or will cause, the rash. Be sure to wash under your fingernails as any plant resin there will continue to spread the rash. Do not rub skin vigorously when washing affected area. Poison ivy cannot spread if no oil from the plant remains on your body. A rash that has progressed to weeping sores will not spread the rash unless you have not washed thoroughly. It is also important to wash any clothes you have been wearing as these may carry active allergens. The rash will return if you wear the unwashed clothing, even several days later. Avoidance of the plant in the future is the best measure. Poison ivy plant can be recognized by the number of leaves. Generally, poison ivy has three leaves with flowering branches on a single stem. Diphenhydramine may be  purchased over the counter and used as needed for itching. Do not drive with this medication if it makes you drowsy.Ask your caregiver about medication for children. SEEK MEDICAL CARE IF:  Open sores develop.  Redness spreads beyond area of rash.  You notice purulent (pus-like) discharge.  You have increased pain.  Other signs of infection develop (such as fever). Document Released: 07/06/2000 Document Revised: 10/01/2011 Document Reviewed: 12/17/2008 [facility] Patient Information 2015 [place], [place]. This information is not intended to replace advice given to you by your health care provider. Make sure you discuss any questions you have with your health care provider.

## 2015-03-21 NOTE — Progress Notes (Signed)
   HPI  Patient presents today for poison oak exposure  Patient explains that he got into some poison oak wedd eating about 2 weeks ago. He has had an itchy red rash since then and would like a shot of steroids to help resolve the rash.  He has been on an [place] cruise which prevented him from coming earlier.  He has been using caladryl with some benefit He denies any fever, chills, sweats, or loss of appetite.  He has not had any areas of drainage He isnt scratching during teh day but is at night without knowing it.   PMH: Smoking status noted ROS: Per HPI  Objective: BP 131/71 mmHg  Pulse 71  Temp(Src) 97.8 F (36.6 C) (Oral)  Ht 5\' 9"  (1.753 m)  Wt 338 lb 4.8 oz (153.452 kg)  BMI 49.94 kg/m2 Gen: NAD, alert, cooperative with exam HEENT: NCAT CV: RRR, good S1/S2, no murmur Resp: CTABL, no wheezes, non-labored Ext: No edema, warm Neuro: Alert and oriented, No gross deficits Skin: BL arms with erythema and scaling, excoriations on the R with 1 area of scabbing, R>L, no areas concerning for infection  Assessment and plan:  # contact derm du eto poison oak exposure Discussed OTC Treatments, caladryl is a good choice- continue as needed No super infection Kenalog Im given F/u as needed    Meds ordered this encounter  Medications  . triamcinolone acetonide (KENALOG-40) injection 40 mg    Sig:     Laroy Apple, MD [facility] 03/21/2015, 1:24 PM

## 2015-04-05 DIAGNOSIS — M5136 Other intervertebral disc degeneration, lumbar region: Secondary | ICD-10-CM | POA: Diagnosis not present

## 2015-04-05 DIAGNOSIS — Z6841 Body Mass Index (BMI) 40.0 and over, adult: Secondary | ICD-10-CM | POA: Diagnosis not present

## 2015-04-05 DIAGNOSIS — M4726 Other spondylosis with radiculopathy, lumbar region: Secondary | ICD-10-CM | POA: Diagnosis not present

## 2015-04-05 DIAGNOSIS — I10 Essential (primary) hypertension: Secondary | ICD-10-CM | POA: Diagnosis not present

## 2015-04-05 DIAGNOSIS — M4806 Spinal stenosis, lumbar region: Secondary | ICD-10-CM | POA: Diagnosis not present

## 2015-04-05 DIAGNOSIS — M4316 Spondylolisthesis, lumbar region: Secondary | ICD-10-CM | POA: Diagnosis not present

## 2015-04-22 DIAGNOSIS — L570 Actinic keratosis: Secondary | ICD-10-CM | POA: Diagnosis not present

## 2015-04-22 DIAGNOSIS — L821 Other seborrheic keratosis: Secondary | ICD-10-CM | POA: Diagnosis not present

## 2015-04-22 DIAGNOSIS — D1801 Hemangioma of skin and subcutaneous tissue: Secondary | ICD-10-CM | POA: Diagnosis not present

## 2015-04-22 DIAGNOSIS — L568 Other specified acute skin changes due to ultraviolet radiation: Secondary | ICD-10-CM | POA: Diagnosis not present

## 2015-04-25 DIAGNOSIS — H2513 Age-related nuclear cataract, bilateral: Secondary | ICD-10-CM | POA: Diagnosis not present

## 2015-05-09 DIAGNOSIS — M4806 Spinal stenosis, lumbar region: Secondary | ICD-10-CM | POA: Diagnosis not present

## 2015-05-09 DIAGNOSIS — M4316 Spondylolisthesis, lumbar region: Secondary | ICD-10-CM | POA: Diagnosis not present

## 2015-05-09 DIAGNOSIS — M47816 Spondylosis without myelopathy or radiculopathy, lumbar region: Secondary | ICD-10-CM | POA: Diagnosis not present

## 2015-05-09 DIAGNOSIS — M5136 Other intervertebral disc degeneration, lumbar region: Secondary | ICD-10-CM | POA: Diagnosis not present

## 2015-05-15 ENCOUNTER — Other Ambulatory Visit: Payer: Self-pay | Admitting: Family Medicine

## 2015-06-15 ENCOUNTER — Telehealth: Payer: Self-pay | Admitting: Family Medicine

## 2015-06-20 NOTE — Telephone Encounter (Signed)
Spoke to pt

## 2015-07-21 ENCOUNTER — Encounter: Payer: Self-pay | Admitting: Family

## 2015-07-21 ENCOUNTER — Ambulatory Visit (INDEPENDENT_AMBULATORY_CARE_PROVIDER_SITE_OTHER): Payer: Medicare Other | Admitting: Family

## 2015-07-21 VITALS — BP 136/75 | HR 92 | Temp 98.3°F | Ht 69.0 in | Wt 348.0 lb

## 2015-07-21 DIAGNOSIS — J019 Acute sinusitis, unspecified: Secondary | ICD-10-CM

## 2015-07-21 MED ORDER — FLUTICASONE PROPIONATE 50 MCG/ACT NA SUSP: 2.0000 | Freq: Every day | NASAL | Status: DC

## 2015-07-21 MED ORDER — AMOXICILLIN-POT CLAVULANATE 875-125 MG PO TABS: 1.0000 | ORAL_TABLET | Freq: Two times a day (BID) | ORAL | Status: DC

## 2015-07-21 NOTE — Progress Notes (Signed)
Subjective:    Patient ID: Ryan Garner, male    DOB: 06-18-1946, 69 y.o.   MRN: [id number]  Sinus Problem This is a recurrent problem. The current episode started more than 1 month ago. The problem has been gradually worsening since onset. The maximum temperature recorded prior to his arrival was 100.4 - 100.9 F. His pain is at a severity of 6/10. The pain is mild. Associated symptoms include chills, congestion, coughing, headaches, a hoarse voice, sinus pressure, sneezing, a sore throat and swollen glands. Pertinent negatives include no ear pain or neck pain. Past treatments include oral decongestants (Mucinex). The treatment provided mild relief.      Review of Systems  Constitutional: Positive for chills.  HENT: Positive for congestion, hoarse voice, sinus pressure, sneezing and sore throat. Negative for ear pain.   Respiratory: Positive for cough.   Cardiovascular: Negative.   Gastrointestinal: Negative.   Endocrine: Negative.   Genitourinary: Negative.   Musculoskeletal: Negative.  Negative for neck pain.  Neurological: Positive for headaches.  Hematological: Negative.   Psychiatric/Behavioral: Negative.   All other systems reviewed and are negative.      Objective:   Physical Exam  Constitutional: He is oriented to person, place, and time. He appears well-developed and well-nourished. No distress.  HENT:  Head: Normocephalic.  Right Ear: External ear normal.  Left Ear: External ear normal.  Nose: Right sinus exhibits maxillary sinus tenderness. Left sinus exhibits maxillary sinus tenderness.  Nasal passage erythemas with mild swelling  Oropharynx erythemas  Eyes: Pupils are equal, round, and reactive to light. Right eye exhibits no discharge. Left eye exhibits no discharge.  Neck: Normal range of motion. Neck supple. No thyromegaly present.  Cardiovascular: Normal rate, regular rhythm, normal heart sounds and intact distal pulses.   No murmur heard. Pulmonary/Chest:  Effort normal and breath sounds normal. No respiratory distress. He has no wheezes.  Abdominal: Soft. Bowel sounds are normal. He exhibits no distension. There is no tenderness.  Musculoskeletal: Normal range of motion. He exhibits no edema or tenderness.  Neurological: He is alert and oriented to person, place, and time. He has normal reflexes. No cranial nerve deficit.  Skin: Skin is warm and dry. No rash noted. No erythema.  Psychiatric: He has a normal mood and affect. His behavior is normal. Judgment and thought content normal.  Vitals reviewed.  BP 136/75 mmHg  Pulse 92  Temp(Src) 98.3 F (36.8 C) (Oral)  Ht 5\' 9"  (1.753 m)  Wt 348 lb (157.852 kg)  BMI 51.37 kg/m2       Assessment & Plan:  1. Acute sinusitis, recurrence not specified, unspecified location -- Take meds as prescribed - Use a cool mist humidifier  -Use saline nose sprays frequently -Saline irrigations of the nose can be very helpful if done frequently.  * 4X daily for 1 week*  * Use of a nettie pot can be helpful with this. Follow directions with this* -Force fluids -For any cough or congestion  Use plain Mucinex- regular strength or max strength is fine   * Children- consult with Pharmacist for dosing -For fever or aces or pains- take tylenol or ibuprofen appropriate for age and weight.  * for fevers greater than 101 orally you may alternate ibuprofen and tylenol every  3 hours. -Throat lozenges if help - amoxicillin-clavulanate (AUGMENTIN) 875-125 MG tablet; Take 1 tablet by mouth 2 (two) times daily.  Dispense: 14 tablet; Refill: 0 - fluticasone (FLONASE) 50 MCG/ACT nasal spray; Place 2 sprays into  both nostrils daily.  Dispense: 16 g; Refill: [place], FNP

## 2015-07-21 NOTE — Patient Instructions (Signed)
Sinusitis, Adult Sinusitis is redness, soreness, and inflammation of the paranasal sinuses. Paranasal sinuses are air pockets within the bones of your face. They are located beneath your eyes, in the middle of your forehead, and above your eyes. In healthy paranasal sinuses, mucus is able to drain out, and air is able to circulate through them by way of your nose. However, when your paranasal sinuses are inflamed, mucus and air can become trapped. This can allow bacteria and other germs to grow and cause infection. Sinusitis can develop quickly and last only a short time (acute) or continue over a long period (chronic). Sinusitis that lasts for more than 12 weeks is considered chronic. CAUSES Causes of sinusitis include:  Allergies.  Structural abnormalities, such as displacement of the cartilage that separates your nostrils (deviated septum), which can decrease the air flow through your nose and sinuses and affect sinus drainage.  Functional abnormalities, such as when the small hairs (cilia) that line your sinuses and help remove mucus do not work properly or are not present. SIGNS AND SYMPTOMS Symptoms of acute and chronic sinusitis are the same. The primary symptoms are pain and pressure around the affected sinuses. Other symptoms include:  Upper toothache.  Earache.  Headache.  Bad breath.  Decreased sense of smell and taste.  A cough, which worsens when you are lying flat.  Fatigue.  Fever.  Thick drainage from your nose, which often is green and may contain pus (purulent).  Swelling and warmth over the affected sinuses. DIAGNOSIS Your health care provider will perform a physical exam. During your exam, your health care provider may perform any of the following to help determine if you have acute sinusitis or chronic sinusitis:  Look in your nose for signs of abnormal growths in your nostrils (nasal polyps).  Tap over the affected sinus to check for signs of  infection.  View the inside of your sinuses using an imaging device that has a light attached (endoscope). If your health care provider suspects that you have chronic sinusitis, one or more of the following tests may be recommended:  Allergy tests.  Nasal culture. A sample of mucus is taken from your nose, sent to a lab, and screened for bacteria.  Nasal cytology. A sample of mucus is taken from your nose and examined by your health care provider to determine if your sinusitis is related to an allergy. TREATMENT Most cases of acute sinusitis are related to a viral infection and will resolve on their own within 10 days. Sometimes, medicines are prescribed to help relieve symptoms of both acute and chronic sinusitis. These may include pain medicines, decongestants, nasal steroid sprays, or saline sprays. However, for sinusitis related to a bacterial infection, your health care provider will prescribe antibiotic medicines. These are medicines that will help kill the bacteria causing the infection. Rarely, sinusitis is caused by a fungal infection. In these cases, your health care provider will prescribe antifungal medicine. For some cases of chronic sinusitis, surgery is needed. Generally, these are cases in which sinusitis recurs more than 3 times per year, despite other treatments. HOME CARE INSTRUCTIONS  Drink plenty of water. Water helps thin the mucus so your sinuses can drain more easily.  Use a humidifier.  Inhale steam 3-4 times a day (for example, sit in the bathroom with the shower running).  Apply a warm, moist washcloth to your face 3-4 times a day, or as directed by your health care provider.  Use saline nasal sprays to help   moisten and clean your sinuses.  Take medicines only as directed by your health care provider.  If you were prescribed either an antibiotic or antifungal medicine, finish it all even if you start to feel better. SEEK IMMEDIATE MEDICAL CARE IF:  You have  increasing pain or severe headaches.  You have nausea, vomiting, or drowsiness.  You have swelling around your face.  You have vision problems.  You have a stiff neck.  You have difficulty breathing.   This information is not intended to replace advice given to you by your health care provider. Make sure you discuss any questions you have with your health care provider.   Document Released: 07/09/2005 Document Revised: 07/30/2014 Document Reviewed: 07/24/2011 Elsevier Interactive Patient Education 2016 [employer]  - Take meds as prescribed - Use a cool mist humidifier  -Use saline nose sprays frequently -Saline irrigations of the nose can be very helpful if done frequently.  * 4X daily for 1 week*  * Use of a nettie pot can be helpful with this. Follow directions with this* -Force fluids -For any cough or congestion  Use plain Mucinex- regular strength or max strength is fine   * Children- consult with Pharmacist for dosing -For fever or aces or pains- take tylenol or ibuprofen appropriate for age and weight.  * for fevers greater than 101 orally you may alternate ibuprofen and tylenol every  3 hours. -Throat lozenges if help   Henley Blyth, FNP   

## 2015-08-15 ENCOUNTER — Other Ambulatory Visit: Payer: Self-pay | Admitting: Family Medicine

## 2015-09-09 ENCOUNTER — Ambulatory Visit (INDEPENDENT_AMBULATORY_CARE_PROVIDER_SITE_OTHER): Payer: Medicare Other | Admitting: Family Medicine

## 2015-09-09 ENCOUNTER — Encounter: Payer: Self-pay | Admitting: Family Medicine

## 2015-09-09 VITALS — BP 136/80 | HR 86 | Temp 97.0°F | Ht 69.0 in | Wt 353.6 lb

## 2015-09-09 DIAGNOSIS — J0101 Acute recurrent maxillary sinusitis: Secondary | ICD-10-CM

## 2015-09-09 MED ORDER — DOXYCYCLINE HYCLATE 100 MG PO TABS: 100.0000 mg | ORAL_TABLET | Freq: Two times a day (BID) | ORAL | Status: DC

## 2015-09-09 NOTE — Patient Instructions (Signed)
It was very good to see you again! Come back if you have any concerns  Finish all antibiotics  Take a pro-biotic (like align but brand name is not important) while you are on the medicine.   Sinusitis, Adult Sinusitis is redness, soreness, and puffiness (inflammation) of the air pockets in the bones of your face (sinuses). The redness, soreness, and puffiness can cause air and mucus to get trapped in your sinuses. This can allow germs to grow and cause an infection.  HOME CARE   Drink enough fluids to keep your pee (urine) clear or pale yellow.  Use a humidifier in your home.  Run a hot shower to create steam in the bathroom. Sit in the bathroom with the door closed. Breathe in the steam 3-4 times a day.  Put a warm, moist washcloth on your face 3-4 times a day, or as told by your doctor.  Use salt water sprays (saline sprays) to wet the thick fluid in your nose. This can help the sinuses drain.  Only take medicine as told by your doctor. GET HELP RIGHT AWAY IF:   Your pain gets worse.  You have very bad headaches.  You are sick to your stomach (nauseous).  You throw up (vomit).  You are very sleepy (drowsy) all the time.  Your face is puffy (swollen).  Your vision changes.  You have a stiff neck.  You have trouble breathing. MAKE SURE YOU:   Understand these instructions.  Will watch your condition.  Will get help right away if you are not doing well or get worse.   This information is not intended to replace advice given to you by your health care provider. Make sure you discuss any questions you have with your health care provider.   Document Released: 12/26/2007 Document Revised: 07/30/2014 Document Reviewed: 02/12/2012 Elsevier Interactive Patient Education [employer].

## 2015-09-09 NOTE — Progress Notes (Signed)
   HPI  Patient presents today here with concern for sinus infection.  Patient explains that over the last 6-7 days he's had maxillary and frontal sinus pressure and pain He had a previous treatment about 6 weeks ago with Augmentin, he had improvement of symptoms but then returned in the last week. He does have a cough as well. No dyspnea, chest pain, food or fluid intolerance, or fever.   PMH: Smoking status noted ROS: Per HPI  Objective: BP 136/80 mmHg  Pulse 86  Temp(Src) 97 F (36.1 C) (Oral)  Ht 5\' 9"  (1.753 m)  Wt 353 lb 9.6 oz (160.392 kg)  BMI 52.19 kg/m2 Gen: NAD, alert, cooperative with exam HEENT: NCAT, sided maxillary tenderness to palpation, frontal sinus tenderness to palpation as well CV: RRR, good S1/S2, no murmur Resp: CTABL, no wheezes, non-labored Ext: No edema, warm Neuro: Alert and oriented, No gross deficits  Assessment and plan:  # Acute maxillary sinusitis, recurrent Treatment doxycycline Discussed probiotics while taking, recent strong antibiotics as well Follow-up as needed if not improved or worsening.    Meds ordered this encounter  Medications  . doxycycline (VIBRA-TABS) 100 MG tablet    Sig: Take 1 tablet (100 mg total) by mouth 2 (two) times daily. 1 po bid    Dispense:  20 tablet    Refill:  0    Laroy Apple, MD [facility] 09/09/2015, 9:56 AM

## 2015-10-04 DIAGNOSIS — M4806 Spinal stenosis, lumbar region: Secondary | ICD-10-CM | POA: Diagnosis not present

## 2015-10-04 DIAGNOSIS — M4316 Spondylolisthesis, lumbar region: Secondary | ICD-10-CM | POA: Diagnosis not present

## 2015-10-04 DIAGNOSIS — I10 Essential (primary) hypertension: Secondary | ICD-10-CM | POA: Diagnosis not present

## 2015-10-04 DIAGNOSIS — Z6841 Body Mass Index (BMI) 40.0 and over, adult: Secondary | ICD-10-CM | POA: Diagnosis not present

## 2015-10-04 DIAGNOSIS — M5136 Other intervertebral disc degeneration, lumbar region: Secondary | ICD-10-CM | POA: Diagnosis not present

## 2015-10-04 DIAGNOSIS — M4726 Other spondylosis with radiculopathy, lumbar region: Secondary | ICD-10-CM | POA: Diagnosis not present

## 2015-10-18 DIAGNOSIS — L218 Other seborrheic dermatitis: Secondary | ICD-10-CM | POA: Diagnosis not present

## 2015-10-18 DIAGNOSIS — L570 Actinic keratosis: Secondary | ICD-10-CM | POA: Diagnosis not present

## 2015-10-20 DIAGNOSIS — M4726 Other spondylosis with radiculopathy, lumbar region: Secondary | ICD-10-CM | POA: Diagnosis not present

## 2015-10-20 DIAGNOSIS — M4316 Spondylolisthesis, lumbar region: Secondary | ICD-10-CM | POA: Diagnosis not present

## 2015-10-20 DIAGNOSIS — M4806 Spinal stenosis, lumbar region: Secondary | ICD-10-CM | POA: Diagnosis not present

## 2016-01-31 DIAGNOSIS — L570 Actinic keratosis: Secondary | ICD-10-CM | POA: Diagnosis not present

## 2016-02-20 ENCOUNTER — Other Ambulatory Visit: Payer: Self-pay | Admitting: Family

## 2016-02-21 ENCOUNTER — Other Ambulatory Visit: Payer: Self-pay | Admitting: Family

## 2016-02-23 DIAGNOSIS — M4726 Other spondylosis with radiculopathy, lumbar region: Secondary | ICD-10-CM | POA: Diagnosis not present

## 2016-02-23 DIAGNOSIS — M4806 Spinal stenosis, lumbar region: Secondary | ICD-10-CM | POA: Diagnosis not present

## 2016-02-23 DIAGNOSIS — M5136 Other intervertebral disc degeneration, lumbar region: Secondary | ICD-10-CM | POA: Diagnosis not present

## 2016-03-08 ENCOUNTER — Encounter: Payer: Self-pay | Admitting: Family Medicine

## 2016-03-08 ENCOUNTER — Ambulatory Visit (INDEPENDENT_AMBULATORY_CARE_PROVIDER_SITE_OTHER): Payer: Medicare Other | Admitting: Family Medicine

## 2016-03-08 VITALS — BP 139/75 | HR 68 | Temp 99.3°F | Ht 69.0 in | Wt 344.0 lb

## 2016-03-08 DIAGNOSIS — I10 Essential (primary) hypertension: Secondary | ICD-10-CM | POA: Diagnosis not present

## 2016-03-08 DIAGNOSIS — E8881 Metabolic syndrome: Secondary | ICD-10-CM

## 2016-03-08 DIAGNOSIS — R7303 Prediabetes: Secondary | ICD-10-CM | POA: Diagnosis not present

## 2016-03-08 LAB — BAYER DCA HB A1C WAIVED: HB A1C (BAYER DCA - WAIVED): 6.5 % (ref <7.0)

## 2016-03-08 MED ORDER — LISINOPRIL-HYDROCHLOROTHIAZIDE 10-12.5 MG PO TABS: 1.0000 | ORAL_TABLET | Freq: Every day | ORAL | 3 refills | Status: DC

## 2016-03-08 NOTE — Patient Instructions (Signed)
Great to see you! Lets follow up in 6 months for pre-diabetes and hypertension.  Try to take a brisk walk 30 minutes 4 times a week  Try to be mindful of your diet to prevent development of full blown diabetes:  Diet Recommendations for Diabetes   Starchy (carb) foods include: Bread, rice, pasta, potatoes, corn, crackers, bagels, muffins, all baked goods.   Protein foods include: Meat, fish, poultry, eggs, dairy foods, and beans such as pinto and kidney beans (beans also provide carbohydrate).   1. Eat at least 3 meals and 1-2 snacks per day. Never go more than 4-5 hours while awake without eating.  2. Limit starchy foods to TWO per meal and ONE per snack. ONE portion of a starchy  food is equal to the following:   - ONE slice of bread (or its equivalent, such as half of a hamburger bun).   - 1/2 cup of a "scoopable" starchy food such as potatoes or rice.   - 1 OUNCE (28 grams) of starchy snack foods such as crackers or pretzels (look on label).   - 15 grams of carbohydrate as shown on food label.  3. Both lunch and dinner should include a protein food, a carb food, and vegetables.   - Obtain twice as many veg's as protein or carbohydrate foods for both lunch and dinner.   - Try to keep frozen veg's on hand for a quick vegetable serving.     - Fresh or frozen veg's are best.  4. Breakfast should always include protein.

## 2016-03-08 NOTE — Progress Notes (Signed)
   HPI  Patient presents today here for routine follow-up of chronic medical conditions.  Hypertension At home usually 130 to 150, and times as high as 170. 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   PMH: Smoking status noted ROS: Per HPI  Objective: BP 139/75   Pulse 68   Temp 99.3 F (37.4 C) (Oral)   Ht '5\' 9"'$  (1.753 m)   Wt (!) 344 lb (156 kg)   BMI 50.80 kg/m  Gen: NAD, alert, cooperative with exam HEENT: NCAT CV: RRR, good S1/S2, no murmur Resp: CTABL, no wheezes, non-labored Neuro: Alert and oriented, No gross deficits  Assessment and plan:  # Hypertension Reasonably well controlled Continue Prinzide Labs  # Prediabetes Checking A1c today, discussed diabetic diet to prevent diabetes. Also discussed exercise  # Metabolic syndrome Discussed diet and exercise, checking lipids today Plan every 6 month follow-up unless needed sooner     Orders Placed This Encounter  Procedures  . CMP14+EGFR  . CBC with Differential  . Lipid panel  . Bayer DCA Hb A1c Waived    Meds ordered this encounter  Medications  . lisinopril-hydrochlorothiazide (PRINZIDE,ZESTORETIC) 10-12.5 MG tablet    Sig: Take 1 tablet by mouth daily.    Dispense:  90 tablet    Refill:  [place], MD [place] 03/08/2016, 8:21 AM

## 2016-03-09 ENCOUNTER — Telehealth: Payer: Self-pay | Admitting: Family Medicine

## 2016-03-09 LAB — CMP14+EGFR
ALT: 19 IU/L (ref 0–44)
AST: 15 IU/L (ref 0–40)
Albumin/Globulin Ratio: 1.8 (ref 1.2–2.2)
Albumin: 4.2 g/dL (ref 3.5–4.8)
Alkaline Phosphatase: 65 IU/L (ref 39–117)
BUN/Creatinine Ratio: 22 (ref 10–24)
BUN: 22 mg/dL (ref 8–27)
Bilirubin Total: 0.4 mg/dL (ref 0.0–1.2)
CO2: 22 mmol/L (ref 18–29)
Calcium: 9.7 mg/dL (ref 8.6–10.2)
Chloride: 101 mmol/L (ref 96–106)
Creatinine, Ser: 1 mg/dL (ref 0.76–1.27)
GFR calc Af Amer: 87 mL/min/{1.73_m2} (ref >59)
GFR calc non Af Amer: 75 mL/min/{1.73_m2} (ref >59)
Globulin, Total: 2.3 g/dL (ref 1.5–4.5)
Glucose: 106 mg/dL — ABNORMAL HIGH (ref 65–99)
Potassium: 5.6 mmol/L — ABNORMAL HIGH (ref 3.5–5.2)
Sodium: 139 mmol/L (ref 134–144)
Total Protein: 6.5 g/dL (ref 6.0–8.5)

## 2016-03-09 LAB — CBC WITH DIFFERENTIAL/PLATELET
Basophils Absolute: 0 10*3/uL (ref 0.0–0.2)
Basos: 0 %
EOS (ABSOLUTE): 0.1 10*3/uL (ref 0.0–0.4)
Eos: 1 %
Hematocrit: 41.9 % (ref 37.5–51.0)
Hemoglobin: 14 g/dL (ref 12.6–17.7)
Immature Grans (Abs): 0.1 10*3/uL (ref 0.0–0.1)
Immature Granulocytes: 1 %
Lymphocytes Absolute: 2.5 10*3/uL (ref 0.7–3.1)
Lymphs: 26 %
MCH: 29.5 pg (ref 26.6–33.0)
MCHC: 33.4 g/dL (ref 31.5–35.7)
MCV: 88 fL (ref 79–97)
Monocytes Absolute: 0.8 10*3/uL (ref 0.1–0.9)
Monocytes: 8 %
Neutrophils Absolute: 6.2 10*3/uL (ref 1.4–7.0)
Neutrophils: 64 %
Platelets: 259 10*3/uL (ref 150–379)
RBC: 4.75 x10E6/uL (ref 4.14–5.80)
RDW: 13.6 % (ref 12.3–15.4)
WBC: 9.5 10*3/uL (ref 3.4–10.8)

## 2016-03-09 LAB — LIPID PANEL
Chol/HDL Ratio: 3.4 ratio units (ref 0.0–5.0)
Cholesterol, Total: 194 mg/dL (ref 100–199)
HDL: 57 mg/dL (ref >39)
LDL Calculated: 123 mg/dL — ABNORMAL HIGH (ref 0–99)
Triglycerides: 69 mg/dL (ref 0–149)
VLDL Cholesterol Cal: 14 mg/dL (ref 5–40)

## 2016-03-09 NOTE — Telephone Encounter (Signed)
Patient aware of results.

## 2016-03-19 ENCOUNTER — Ambulatory Visit (INDEPENDENT_AMBULATORY_CARE_PROVIDER_SITE_OTHER): Payer: Medicare Other | Admitting: Pharmacist

## 2016-03-19 ENCOUNTER — Encounter: Payer: Self-pay | Admitting: Pharmacist

## 2016-03-19 DIAGNOSIS — E119 Type 2 diabetes mellitus without complications: Secondary | ICD-10-CM | POA: Diagnosis not present

## 2016-03-19 NOTE — Progress Notes (Signed)
Patient ID: Ryan Garner, male   DOB: 02-06-1946, 70 y.o.   MRN: [id number]  Subjective:    Ryan Garner is a 70 y.o. male who presents for an initial evaluation of Type 2 diabetes mellitus / pre diabetes.  Ryan Garner was found to have elevated FBG of 106 03/08/2016.  A1c was added and was 6.5%.   He has a positive family history of DM: Mat Grandmother and 1 brother with type 2 DM.   Known diabetic complications: none Cardiovascular risk factors: advanced age (older than 62 for men, 56 for women), diabetes mellitus, dyslipidemia, hypertension, male gender, obesity (BMI >= 30 kg/m2) and sedentary lifestyle Current diabetic medications include none.   Eye exam current (within one year): no Weight trend: stable Prior visit with CDE: no Current diet: in general, an "unhealthy" diet Current exercise: swimming Medication Compliance?  Yes  Current monitoring regimen: none Home blood sugar records: n/a Any episodes of hypoglycemia? no  Is He on ACE inhibitor or angiotensin II receptor blocker?  Yes  lisinopril (generic)    The following portions of the patient's history were reviewed and updated as appropriate: allergies, current medications, past family history, past medical history, past social history, past surgical history and problem list.    Objective:    Ht 5\' 9"  (1.753 m)   Wt (!) 344 lb (156 kg)   BMI 50.80 kg/m   A1c = 6.5% (03/08/2016) Lab Review Glucose (mg/dL)  Date Value  03/08/2016 106 (H)  08/27/2014 110 (H)  11/13/2013 114 (H)   CO2 (mmol/L)  Date Value  03/08/2016 22  08/27/2014 23  11/13/2013 27   BUN (mg/dL)  Date Value  03/08/2016 22  08/27/2014 23  11/13/2013 19   Creatinine, Ser (mg/dL)  Date Value  03/08/2016 1.00  08/27/2014 1.00  11/13/2013 1.02     Assessment:    Diabetes Mellitus type II - newly diagnosed, currently at A1c goal of less than 6.5%   Plan:    1.  Rx changes: none 2.  Education: Reviewed 'ABCs' of diabetes  management (respective goals in parentheses):  A1C (<7), blood pressure (<130/80), and cholesterol (LDL <100). 3. Discussed pathophysiology of DM; difference between type 1 and type 2 DM. 4. CHO counting diet discussed.  Reviewed CHO amount in various foods and how to read nutrition labels.  Discussed recommended serving sizes.  5.  Recommend check BG daily to every other day.   I offered glucometer but patient declined - states his wife has a meter he will use.  Discussed BG goals for home monitoring 6.  Recommended increase physical activity - goal is 150 minutes per week 7. Follow up: 3 months

## 2016-03-19 NOTE — Patient Instructions (Signed)
Diabetes and Standards of Medical Care   Diabetes is complicated. You may find that your diabetes team includes a dietitian, nurse, diabetes educator, eye doctor, and more. To help everyone know what is going on and to help you get the care you deserve, the following schedule of care was developed to help keep you on track. Below are the tests, exams, vaccines, medicines, education, and plans you will need.  Blood Glucose Goals Prior to meals = 80 - 130 Within 2 hours of the start of a meal = less than 180  HbA1c test (goal is less than 6.5% - your last value was 6.5%) This test shows how well you have controlled your glucose over the past 2 to 3 months. It is used to see if your diabetes management plan needs to be adjusted.   It is performed at least 2 times a year if you are meeting treatment goals.  It is performed 4 times a year if therapy has changed or if you are not meeting treatment goals.  Blood pressure test  This test is performed at every routine medical visit. The goal is less than 140/90 mmHg for most people, but 130/80 mmHg in some cases. Ask your health care provider about your goal.  Dental exam  Follow up with the dentist regularly.  Eye exam  If you are diagnosed with type 1 diabetes as a child, get an exam upon reaching the age of 18 years or older and have had diabetes for 3 to 5 years. Yearly eye exams are recommended after that initial eye exam.  If you are diagnosed with type 1 diabetes as an adult, get an exam within 5 years of diagnosis and then yearly.  If you are diagnosed with type 2 diabetes, get an exam as soon as possible after the diagnosis and then yearly.  Foot care exam  Visual foot exams are performed at every routine medical visit. The exams check for cuts, injuries, or other problems with the feet.  A comprehensive foot exam should be done yearly. This includes visual inspection as well as assessing foot pulses and testing for loss of  sensation.  Check your feet nightly for cuts, injuries, or other problems with your feet. Tell your health care provider if anything is not healing.  Kidney function test (urine microalbumin)  This test is performed once a year.  Type 1 diabetes: The first test is performed 5 years after diagnosis.  Type 2 diabetes: The first test is performed at the time of diagnosis.  A serum creatinine and estimated glomerular filtration rate (eGFR) test is done once a year to assess the level of chronic kidney disease (CKD), if present.  Lipid profile (cholesterol, HDL, LDL, triglycerides)  Performed every 5 years for most people.  The goal for LDL is less than 100 mg/dL. If you are at high risk, the goal is less than 70 mg/dL.  The goal for HDL is 40 mg/dL to 50 mg/dL for men and 50 mg/dL to 60 mg/dL for women. An HDL cholesterol of 60 mg/dL or higher gives some protection against heart disease.  The goal for triglycerides is less than 150 mg/dL.  Influenza vaccine, pneumococcal vaccine, and hepatitis B vaccine  The influenza vaccine is recommended yearly.  The pneumococcal vaccine is generally given once in a lifetime. However, there are some instances when another vaccination is recommended. Check with your health care provider.  The hepatitis B vaccine is also recommended for adults with diabetes.  Diabetes self-management education  Education is recommended at diagnosis and ongoing as needed.  Treatment plan  Your treatment plan is reviewed at every medical visit.  Document Released: 05/06/2009 Document Revised: 03/11/2013 Document Reviewed: 12/09/2012 [employer] Patient Information 2014 [employer].   

## 2016-03-20 DIAGNOSIS — E119 Type 2 diabetes mellitus without complications: Secondary | ICD-10-CM | POA: Insufficient documentation

## 2016-05-15 DIAGNOSIS — M48062 Spinal stenosis, lumbar region with neurogenic claudication: Secondary | ICD-10-CM | POA: Diagnosis not present

## 2016-05-15 DIAGNOSIS — M4726 Other spondylosis with radiculopathy, lumbar region: Secondary | ICD-10-CM | POA: Diagnosis not present

## 2016-05-15 DIAGNOSIS — Z6841 Body Mass Index (BMI) 40.0 and over, adult: Secondary | ICD-10-CM | POA: Diagnosis not present

## 2016-05-15 DIAGNOSIS — M4316 Spondylolisthesis, lumbar region: Secondary | ICD-10-CM | POA: Diagnosis not present

## 2016-05-15 DIAGNOSIS — M5136 Other intervertebral disc degeneration, lumbar region: Secondary | ICD-10-CM | POA: Diagnosis not present

## 2016-05-15 DIAGNOSIS — I10 Essential (primary) hypertension: Secondary | ICD-10-CM | POA: Diagnosis not present

## 2016-06-11 ENCOUNTER — Ambulatory Visit: Payer: Self-pay | Admitting: Family Medicine

## 2016-07-19 ENCOUNTER — Ambulatory Visit (INDEPENDENT_AMBULATORY_CARE_PROVIDER_SITE_OTHER): Payer: Medicare Other | Admitting: Pediatrics

## 2016-07-19 ENCOUNTER — Encounter: Payer: Self-pay | Admitting: Pediatrics

## 2016-07-19 DIAGNOSIS — J019 Acute sinusitis, unspecified: Secondary | ICD-10-CM

## 2016-07-19 MED ORDER — AMOXICILLIN 500 MG PO CAPS: 500.0000 mg | ORAL_CAPSULE | Freq: Two times a day (BID) | ORAL | 0 refills | Status: DC

## 2016-07-19 MED ORDER — FLUTICASONE PROPIONATE 50 MCG/ACT NA SUSP: 2.0000 | Freq: Every day | NASAL | 6 refills | Status: DC

## 2016-07-19 NOTE — Progress Notes (Signed)
  Subjective:   Patient ID: Ryan Garner, male    DOB: Dec 21, 1945, 70 y.o.   MRN: [id number] CC: Sinus Pressure and Nasal Congestion  HPI: Ryan Garner is a 70 y.o. male presenting for Sinus Pressure and Nasal Congestion  Started getting sick about two weeks ago No better today compared to yesterday Head and throat bothering him the most Aleve some, helps No other OTC medicines Does think he is coughing things up Blowing nsoe a lot in the morning at first, now throughout the day as well, worse in the morning Subjective temp Ears feel full  Relevant past medical, surgical, family and social history reviewed. Allergies and medications reviewed and updated. History  Smoking Status  . Former Smoker  Smokeless Tobacco  . Never Used   ROS: Per HPI   Objective:    BP 138/82   Pulse 73   Temp 97 F (36.1 C) (Oral)   Ht 5\' 9"  (1.753 m)   Wt (!) 351 lb (159.2 kg)   BMI 51.83 kg/m   Wt Readings from Last 3 Encounters:  07/19/16 (!) 351 lb (159.2 kg)  03/19/16 (!) 344 lb (156 kg)  03/08/16 (!) 344 lb (156 kg)    Gen: NAD, alert, cooperative with exam, NCAT EYES: EOMI, no conjunctival injection, or no icterus ENT:  TMs dull pink b/l, OP with erythema, some max sinus tenderness b/l LYMPH: no cervical LAD CV: NRRR, normal S1/S2 Resp: CTABL, no wheezes, normal WOB Abd: +BS, soft, NTND. no guarding or organomegaly Ext: No edema, warm Neuro: Alert and oriented  Assessment & Plan:  Ryan Garner was seen today for sinus pressure and nasal congestion.  Diagnoses and all orders for this visit:  Acute sinusitis, recurrence not specified, unspecified location Discussed symptomatic care Start below if no improvement next few days -     fluticasone (FLONASE) 50 MCG/ACT nasal spray; Place 2 sprays into both nostrils daily. -     amoxicillin (AMOXIL) 500 MG capsule; Take 1 capsule (500 mg total) by mouth 2 (two) times daily.   Follow up plan: prn Assunta Found, MD [place]

## 2016-07-19 NOTE — Patient Instructions (Signed)
Netipot with distilled water 2-3 times a day to clear out sinuses Or Normal saline nasal spray Flonase steroid nasal spray Can try cetirizine or similar anti-histamine to help with congestion as well

## 2016-07-26 ENCOUNTER — Ambulatory Visit: Payer: Medicare Other | Admitting: Pediatrics

## 2016-07-26 ENCOUNTER — Ambulatory Visit (INDEPENDENT_AMBULATORY_CARE_PROVIDER_SITE_OTHER): Payer: Medicare Other | Admitting: Pediatrics

## 2016-07-26 ENCOUNTER — Encounter: Payer: Self-pay | Admitting: Pediatrics

## 2016-07-26 VITALS — BP 133/71 | HR 79 | Temp 98.0°F | Ht 69.0 in | Wt 342.0 lb

## 2016-07-26 DIAGNOSIS — J019 Acute sinusitis, unspecified: Secondary | ICD-10-CM

## 2016-07-26 MED ORDER — AMOXICILLIN-POT CLAVULANATE 875-125 MG PO TABS: 1.0000 | ORAL_TABLET | Freq: Two times a day (BID) | ORAL | 0 refills | Status: DC

## 2016-07-26 NOTE — Progress Notes (Signed)
  Subjective:   Patient ID: Ryan Garner, male    DOB: [birth year], 71 y.o.   MRN: [id number] CC: Sinusitis (continued sinus drainage, (green),sore throat finished Amoxicillin today)  HPI: Ryan Garner is a 71 y.o. male presenting for Sinusitis (continued sinus drainage, (green),sore throat finished Amoxicillin today)  No improvement in symptoms No fevers Appetite has been ok Using flonase Has used neti pot in the past  Relevant past medical, surgical, family and social history reviewed. Allergies and medications reviewed and updated. History  Smoking Status  . Former Smoker  Smokeless Tobacco  . Never Used   ROS: Per HPI   Objective:    BP 133/71 (BP Location: Left Arm, Patient Position: Sitting, Cuff Size: Large)   Pulse 79   Temp 98 F (36.7 C) (Oral)   Ht 5\' 9"  (1.753 m)   Wt (!) 342 lb (155.1 kg)   BMI 50.50 kg/m   Wt Readings from Last 3 Encounters:  07/26/16 (!) 342 lb (155.1 kg)  07/19/16 (!) 351 lb (159.2 kg)  03/19/16 (!) 344 lb (156 kg)    Gen: NAD, alert, cooperative with exam, NCAT EYES: EOMI, no conjunctival injection, or no icterus ENT:  R TM pink, L TM pearly gray, OP without erythema, some tenderness over sinuses LYMPH: no cervical LAD CV: NRRR, normal S1/S2, no murmur, distal pulses 2+ b/l Resp: CTABL, no wheezes, normal WOB Ext: No edema, warm Neuro: Alert and oriented MSK: normal muscle bulk  Assessment & Plan:  Santos was seen today for sinusitis.  Diagnoses and all orders for this visit:  Acute sinusitis, recurrence not specified, unspecified location Sinus rinses twice a day flonase Take below x 7 days -     amoxicillin-clavulanate (AUGMENTIN) 875-125 MG tablet; Take 1 tablet by mouth 2 (two) times daily.   Follow up plan: prn Assunta Found, MD [place]

## 2016-07-30 ENCOUNTER — Ambulatory Visit (INDEPENDENT_AMBULATORY_CARE_PROVIDER_SITE_OTHER): Payer: Medicare Other | Admitting: *Deleted

## 2016-07-30 DIAGNOSIS — Z23 Encounter for immunization: Secondary | ICD-10-CM

## 2016-07-30 NOTE — Progress Notes (Signed)
Tdap given Pt tolerated well  [place] the [place], thus need for vaccine

## 2016-09-03 DIAGNOSIS — L570 Actinic keratosis: Secondary | ICD-10-CM | POA: Diagnosis not present

## 2016-09-10 ENCOUNTER — Ambulatory Visit (INDEPENDENT_AMBULATORY_CARE_PROVIDER_SITE_OTHER): Payer: Medicare Other | Admitting: Family Medicine

## 2016-09-10 ENCOUNTER — Encounter: Payer: Self-pay | Admitting: Family Medicine

## 2016-09-10 ENCOUNTER — Encounter (INDEPENDENT_AMBULATORY_CARE_PROVIDER_SITE_OTHER): Payer: Self-pay

## 2016-09-10 VITALS — BP 150/83 | HR 67 | Temp 97.5°F | Ht 69.0 in | Wt 343.0 lb

## 2016-09-10 DIAGNOSIS — E785 Hyperlipidemia, unspecified: Secondary | ICD-10-CM

## 2016-09-10 DIAGNOSIS — E119 Type 2 diabetes mellitus without complications: Secondary | ICD-10-CM

## 2016-09-10 DIAGNOSIS — I10 Essential (primary) hypertension: Secondary | ICD-10-CM | POA: Diagnosis not present

## 2016-09-10 NOTE — Patient Instructions (Signed)
Great to see you!  Come back in 3-4 month sunless you need [place] sooner.   We have referred you to see an eye doctor for an annual screening.

## 2016-09-10 NOTE — Progress Notes (Signed)
   HPI  Patient presents today here for follow-up of chronic medical conditions.  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  Patient feels well today and has no complaints.  Type 2 diabetes was diagnosed last visit, he feels he is making good diet changes, he is reasonably active.  Hypertension Usually [id number] systolic at home. No chest pain, dyspnea, palpitations, headaches.  Hyperlipidemia Mild, is cussed possible medications.   PMH: Smoking status noted ROS: Per HPI  Objective: BP (!) 150/83   Pulse 67   Temp 97.5 F (36.4 C) (Oral)   Ht 5\' 9"  (1.753 m)   Wt (!) 343 lb (155.6 kg)   BMI 50.65 kg/m  Gen: NAD, alert, cooperative with exam HEENT: NCAT CV: RRR, good S1/S2, no murmur Resp: CTABL, no wheezes, non-labored Ext: No edema, warm Neuro: Alert and oriented, No gross deficits  Diabetic Foot Exam - Simple   Simple Foot Form Diabetic Foot exam was performed with the following findings:  Yes 09/10/2016  8:14 AM  Visual Inspection No deformities, no ulcerations, no other skin breakdown bilaterally:  Yes Sensation Testing Intact to touch and monofilament testing bilaterally:  Yes Pulse Check Posterior Tibialis and Dorsalis pulse intact bilaterally:  Yes Comments      Assessment and plan:  # T2 diabetes Diet controlled, A1c pending Ophthalmology referral, foot exam, urine macro today.   # Hypertension Elevated today, however controlled at home Continue Prinzide  # Her lipidemia Mildly elevated, discussed statins Will wait for now.     Orders Placed This Encounter  Procedures  . Bayer DCA Hb A1c Waived  . Ambulatory referral to Ophthalmology    Referral Priority:   Routine    Referral Type:   Consultation    Referral Reason:   Specialty Services Required    Requested Specialty:   Ophthalmology    Number of Visits Requested:   [place], MD [facility] 09/10/2016, 8:17  AM

## 2016-09-24 DIAGNOSIS — H43393 Other vitreous opacities, bilateral: Secondary | ICD-10-CM | POA: Diagnosis not present

## 2016-09-24 DIAGNOSIS — H2513 Age-related nuclear cataract, bilateral: Secondary | ICD-10-CM | POA: Diagnosis not present

## 2016-09-24 DIAGNOSIS — H5353 Deuteranomaly: Secondary | ICD-10-CM | POA: Diagnosis not present

## 2016-10-24 ENCOUNTER — Emergency Department (HOSPITAL_COMMUNITY)
Admission: EM | Admit: 2016-10-24 | Discharge: 2016-10-25 | Disposition: A | Discharge status: Home / Self Care | Payer: Medicare Other | Attending: Emergency Medicine | Admitting: Emergency Medicine

## 2016-10-24 ENCOUNTER — Encounter (HOSPITAL_COMMUNITY): Payer: Self-pay | Admitting: Emergency Medicine

## 2016-10-24 ENCOUNTER — Emergency Department (HOSPITAL_COMMUNITY): Payer: Medicare Other

## 2016-10-24 DIAGNOSIS — M79602 Pain in left arm: Secondary | ICD-10-CM | POA: Diagnosis not present

## 2016-10-24 DIAGNOSIS — S1083XA Contusion of other specified part of neck, initial encounter: Secondary | ICD-10-CM | POA: Diagnosis not present

## 2016-10-24 DIAGNOSIS — I10 Essential (primary) hypertension: Secondary | ICD-10-CM | POA: Insufficient documentation

## 2016-10-24 DIAGNOSIS — S299XXA Unspecified injury of thorax, initial encounter: Secondary | ICD-10-CM | POA: Diagnosis not present

## 2016-10-24 DIAGNOSIS — R918 Other nonspecific abnormal finding of lung field: Secondary | ICD-10-CM

## 2016-10-24 DIAGNOSIS — T148XXA Other injury of unspecified body region, initial encounter: Secondary | ICD-10-CM

## 2016-10-24 DIAGNOSIS — Y999 Unspecified external cause status: Secondary | ICD-10-CM | POA: Diagnosis not present

## 2016-10-24 DIAGNOSIS — S1093XA Contusion of unspecified part of neck, initial encounter: Secondary | ICD-10-CM

## 2016-10-24 DIAGNOSIS — Y939 Activity, unspecified: Secondary | ICD-10-CM | POA: Diagnosis not present

## 2016-10-24 DIAGNOSIS — R935 Abnormal findings on diagnostic imaging of other abdominal regions, including retroperitoneum: Secondary | ICD-10-CM | POA: Diagnosis not present

## 2016-10-24 DIAGNOSIS — M79605 Pain in left leg: Secondary | ICD-10-CM | POA: Diagnosis not present

## 2016-10-24 DIAGNOSIS — S199XXA Unspecified injury of neck, initial encounter: Secondary | ICD-10-CM | POA: Diagnosis not present

## 2016-10-24 DIAGNOSIS — S61512A Laceration without foreign body of left wrist, initial encounter: Secondary | ICD-10-CM | POA: Diagnosis not present

## 2016-10-24 DIAGNOSIS — R52 Pain, unspecified: Secondary | ICD-10-CM | POA: Diagnosis not present

## 2016-10-24 DIAGNOSIS — Z87891 Personal history of nicotine dependence: Secondary | ICD-10-CM | POA: Insufficient documentation

## 2016-10-24 DIAGNOSIS — R0789 Other chest pain: Secondary | ICD-10-CM

## 2016-10-24 DIAGNOSIS — Y92410 Unspecified street and highway as the place of occurrence of the external cause: Secondary | ICD-10-CM | POA: Insufficient documentation

## 2016-10-24 DIAGNOSIS — M542 Cervicalgia: Secondary | ICD-10-CM | POA: Diagnosis not present

## 2016-10-24 DIAGNOSIS — I712 Thoracic aortic aneurysm, without rupture: Secondary | ICD-10-CM | POA: Insufficient documentation

## 2016-10-24 DIAGNOSIS — I7121 Aneurysm of the ascending aorta, without rupture: Secondary | ICD-10-CM

## 2016-10-24 DIAGNOSIS — S20319A Abrasion of unspecified front wall of thorax, initial encounter: Secondary | ICD-10-CM | POA: Insufficient documentation

## 2016-10-24 DIAGNOSIS — S0990XA Unspecified injury of head, initial encounter: Secondary | ICD-10-CM | POA: Diagnosis not present

## 2016-10-24 DIAGNOSIS — E119 Type 2 diabetes mellitus without complications: Secondary | ICD-10-CM | POA: Insufficient documentation

## 2016-10-24 DIAGNOSIS — S8992XA Unspecified injury of left lower leg, initial encounter: Secondary | ICD-10-CM | POA: Diagnosis present

## 2016-10-24 DIAGNOSIS — R079 Chest pain, unspecified: Secondary | ICD-10-CM | POA: Diagnosis not present

## 2016-10-24 DIAGNOSIS — S81812A Laceration without foreign body, left lower leg, initial encounter: Secondary | ICD-10-CM | POA: Diagnosis not present

## 2016-10-24 DIAGNOSIS — S3991XA Unspecified injury of abdomen, initial encounter: Secondary | ICD-10-CM | POA: Diagnosis not present

## 2016-10-24 LAB — CBC WITH DIFFERENTIAL/PLATELET
Basophils Absolute: 0 10*3/uL (ref 0.0–0.1)
Basophils Relative: 0 %
Eosinophils Absolute: 0.1 10*3/uL (ref 0.0–0.7)
Eosinophils Relative: 1 %
HCT: 40.4 % (ref 39.0–52.0)
Hemoglobin: 13.7 g/dL (ref 13.0–17.0)
Lymphocytes Relative: 13 %
Lymphs Abs: 1.8 10*3/uL (ref 0.7–4.0)
MCH: 29.4 pg (ref 26.0–34.0)
MCHC: 33.9 g/dL (ref 30.0–36.0)
MCV: 86.7 fL (ref 78.0–100.0)
Monocytes Absolute: 0.6 10*3/uL (ref 0.1–1.0)
Monocytes Relative: 4 %
Neutro Abs: 11.3 10*3/uL — ABNORMAL HIGH (ref 1.7–7.7)
Neutrophils Relative %: 82 %
Platelets: 260 10*3/uL (ref 150–400)
RBC: 4.66 MIL/uL (ref 4.22–5.81)
RDW: 13.1 % (ref 11.5–15.5)
WBC: 13.8 10*3/uL — ABNORMAL HIGH (ref 4.0–10.5)

## 2016-10-24 LAB — BASIC METABOLIC PANEL
Anion gap: 11 (ref 5–15)
BUN: 17 mg/dL (ref 6–20)
CO2: 21 mmol/L — ABNORMAL LOW (ref 22–32)
Calcium: 9.1 mg/dL (ref 8.9–10.3)
Chloride: 101 mmol/L (ref 101–111)
Creatinine, Ser: 1.07 mg/dL (ref 0.61–1.24)
GFR calc Af Amer: >60 mL/min (ref >60)
GFR calc non Af Amer: >60 mL/min (ref >60)
Glucose, Bld: 140 mg/dL — ABNORMAL HIGH (ref 65–99)
Potassium: 3.9 mmol/L (ref 3.5–5.1)
Sodium: 133 mmol/L — ABNORMAL LOW (ref 135–145)

## 2016-10-24 MED ORDER — MORPHINE SULFATE (PF) 4 MG/ML IV SOLN
4.0000 mg | Freq: Once | INTRAVENOUS | Status: AC
  Administered 2016-10-24: 4 mg via INTRAVENOUS
  Filled 2016-10-24: qty 1

## 2016-10-24 MED ORDER — IOPAMIDOL (ISOVUE-370) INJECTION 76%
INTRAVENOUS | Status: AC
  Administered 2016-10-25: 100 mL
  Filled 2016-10-24: qty 50

## 2016-10-24 MED ORDER — IOPAMIDOL (ISOVUE-370) INJECTION 76%
INTRAVENOUS | Status: AC
  Administered 2016-10-25: 100 mL
  Filled 2016-10-24: qty 100
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
## 2016-10-24 NOTE — ED Notes (Signed)
Patient transported to CT 

## 2016-10-24 NOTE — ED Notes (Signed)
Patient transported to X-ray 

## 2016-10-25 ENCOUNTER — Emergency Department (HOSPITAL_COMMUNITY): Payer: Medicare Other

## 2016-10-25 ENCOUNTER — Encounter (HOSPITAL_COMMUNITY): Payer: Self-pay | Admitting: Radiology

## 2016-10-25 DIAGNOSIS — M48061 Spinal stenosis, lumbar region without neurogenic claudication: Secondary | ICD-10-CM | POA: Diagnosis not present

## 2016-10-25 DIAGNOSIS — M542 Cervicalgia: Secondary | ICD-10-CM | POA: Diagnosis not present

## 2016-10-25 DIAGNOSIS — S0990XA Unspecified injury of head, initial encounter: Secondary | ICD-10-CM | POA: Diagnosis not present

## 2016-10-25 DIAGNOSIS — S3991XA Unspecified injury of abdomen, initial encounter: Secondary | ICD-10-CM | POA: Diagnosis not present

## 2016-10-25 DIAGNOSIS — S199XXA Unspecified injury of neck, initial encounter: Secondary | ICD-10-CM | POA: Diagnosis not present

## 2016-10-25 DIAGNOSIS — R079 Chest pain, unspecified: Secondary | ICD-10-CM | POA: Diagnosis not present

## 2016-10-25 DIAGNOSIS — S81812A Laceration without foreign body, left lower leg, initial encounter: Secondary | ICD-10-CM | POA: Diagnosis not present

## 2016-10-25 DIAGNOSIS — M5136 Other intervertebral disc degeneration, lumbar region: Secondary | ICD-10-CM | POA: Diagnosis not present

## 2016-10-25 DIAGNOSIS — M4726 Other spondylosis with radiculopathy, lumbar region: Secondary | ICD-10-CM | POA: Diagnosis not present

## 2016-10-25 DIAGNOSIS — S299XXA Unspecified injury of thorax, initial encounter: Secondary | ICD-10-CM | POA: Diagnosis not present

## 2016-10-25 MED ORDER — BACITRACIN ZINC 500 UNIT/GM EX OINT: 1.0000 "application " | TOPICAL_OINTMENT | Freq: Two times a day (BID) | CUTANEOUS | 0 refills | Status: DC

## 2016-10-25 MED ORDER — LIDOCAINE-EPINEPHRINE (PF) 2 %-1:200000 IJ SOLN
20.0000 mL | Freq: Once | INTRAMUSCULAR | Status: AC
  Administered 2016-10-25: 20 mL
  Filled 2016-10-25: qty 20

## 2016-10-25 MED ORDER — METHOCARBAMOL 500 MG PO TABS: 500.0000 mg | ORAL_TABLET | Freq: Two times a day (BID) | ORAL | 0 refills | Status: DC | PRN
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
## 2016-11-05 ENCOUNTER — Ambulatory Visit (INDEPENDENT_AMBULATORY_CARE_PROVIDER_SITE_OTHER): Payer: Medicare Other | Admitting: Family Medicine

## 2016-11-05 ENCOUNTER — Encounter: Payer: Self-pay | Admitting: Family Medicine

## 2016-11-05 VITALS — BP 133/75 | HR 71 | Temp 98.5°F | Ht 69.0 in | Wt 341.0 lb

## 2016-11-05 DIAGNOSIS — S81812A Laceration without foreign body, left lower leg, initial encounter: Secondary | ICD-10-CM

## 2016-11-05 DIAGNOSIS — S139XXA Sprain of joints and ligaments of unspecified parts of neck, initial encounter: Secondary | ICD-10-CM
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
## 2016-11-12 ENCOUNTER — Ambulatory Visit (INDEPENDENT_AMBULATORY_CARE_PROVIDER_SITE_OTHER): Payer: Medicare Other | Admitting: Family Medicine

## 2016-11-12 ENCOUNTER — Encounter: Payer: Self-pay | Admitting: Family Medicine

## 2016-11-12 VITALS — BP 137/86 | HR 70 | Temp 97.4°F | Ht 69.0 in | Wt 341.0 lb

## 2016-11-12 DIAGNOSIS — S81812D Laceration without foreign body, left lower leg, subsequent encounter: Secondary | ICD-10-CM

## 2016-11-12 MED ORDER — SULFAMETHOXAZOLE-TRIMETHOPRIM 800-160 MG PO TABS: 1.0000 | ORAL_TABLET | Freq: Two times a day (BID) | ORAL | 0 refills | Status: DC

## 2016-11-12 NOTE — Progress Notes (Signed)
BP 137/86   Pulse 70   Temp 97.4 F (36.3 C) (Oral)   Ht 5\' 9"  (1.753 m)   Wt (!) 341 lb (154.7 kg)   BMI 50.36 kg/m    Subjective:    Patient ID: Ryan Garner, male    DOB: 1946-04-08, 71 y.o.   MRN: [id number]  HPI: Ryan Garner is a 71 y.o. male presenting on 11/12/2016 for Followup leg wound   HPI Leg wound recheck Patient is coming in today for a wound recheck on the laceration he sustained on his left lower shin. He wore the [employer] this last week and is coming in to get it checked today. He denies any fevers or chills or redness or warmth. Says swelling is down because of the rapidly put on him. He did have some bleeding coming through the wrap when it was on and it washed it and put it back on again.  Relevant past medical, surgical, family and social history reviewed and updated as indicated. Interim medical history since our last visit reviewed. Allergies and medications reviewed and updated.  Review of Systems  Constitutional: Negative for chills and fever.  Respiratory: Negative for shortness of breath and wheezing.   Cardiovascular: Positive for leg swelling. Negative for chest pain.  Musculoskeletal: Negative for back pain and gait problem.  Skin: Positive for wound. Negative for rash.  All other systems reviewed and are negative.   Per HPI unless specifically indicated above        Objective:    BP 137/86   Pulse 70   Temp 97.4 F (36.3 C) (Oral)   Ht 5\' 9"  (1.753 m)   Wt (!) 341 lb (154.7 kg)   BMI 50.36 kg/m   Wt Readings from Last 3 Encounters:  11/12/16 (!) 341 lb (154.7 kg)  11/05/16 (!) 341 lb (154.7 kg)  10/24/16 (!) 330 lb (149.7 kg)    Physical Exam  Constitutional: He is oriented to person, place, and time. He appears well-developed and well-nourished. No distress.  Eyes: Conjunctivae are normal. No scleral icterus.  Musculoskeletal: Normal range of motion. He exhibits no edema.  Neurological: He is alert and oriented to person,  place, and time. Coordination normal.  Skin: Skin is warm and dry. Lesion (V-shaped laceration on left anterior lower leg. The flap of skin seems to be healing together better to the surrounding tissue but the flap does have still a purplish discoloration and there is some concern about blood flow getting back into the region.) noted. No rash noted. He is not diaphoretic.  Psychiatric: He has a normal mood and affect. His behavior is normal.  Nursing note and vitals reviewed.   Nurse to place an Haematologist, return in 1 week. May use heat compress over the wound to help draw the blood flow to the area.    Assessment & Plan:   Problem List Items Addressed This Visit    None    Visit Diagnoses    Laceration of left lower extremity, subsequent encounter    -  Primary   Relevant Medications   sulfamethoxazole-trimethoprim (BACTRIM DS,SEPTRA DS) 800-160 MG tablet       Follow up plan: Return in about 1 week (around 11/19/2016), or if symptoms worsen or fail to improve, for Follow-up wound.  Counseling provided for all of the vaccine components No orders of the defined types were placed in this encounter.   Caryl Pina, MD [facility] 11/12/2016, 8:26 AM

## 2016-11-19 ENCOUNTER — Encounter: Payer: Self-pay | Admitting: Family Medicine

## 2016-11-19 ENCOUNTER — Ambulatory Visit (INDEPENDENT_AMBULATORY_CARE_PROVIDER_SITE_OTHER): Payer: Medicare Other | Admitting: Family Medicine

## 2016-11-19 VITALS — BP 125/68 | HR 81 | Temp 98.1°F | Ht 69.0 in | Wt 339.0 lb

## 2016-11-19 DIAGNOSIS — S81812D Laceration without foreign body, left lower leg, subsequent encounter: Secondary | ICD-10-CM | POA: Diagnosis not present

## 2016-11-19 NOTE — Progress Notes (Signed)
   BP 125/68   Pulse 81   Temp 98.1 F (36.7 C) (Oral)   Ht 5\' 9"  (1.753 m)   Wt (!) 339 lb (153.8 kg)   BMI 50.06 kg/m    Subjective:    Patient ID: Ryan Garner, male    DOB: Dec 19, 1945, 71 y.o.   MRN: [id number]  HPI: Ryan Garner is a 71 y.o. male presenting on 11/19/2016 for Laceration (1 wk followup)   HPI Laceration/wound follow-up Patient is coming in for laceration/wound follow-up. He has been wearing his Unna boot for the past week and has had some drainage coming through the wrap. He denies any fevers or chills or redness or warmth. He denies any pain anywhere else except right around the wound site.  Relevant past medical, surgical, family and social history reviewed and updated as indicated. Interim medical history since our last visit reviewed. Allergies and medications reviewed and updated.  Review of Systems  Constitutional: Negative for chills and fever.  Respiratory: Negative for shortness of breath and wheezing.   Cardiovascular: Negative for chest pain and leg swelling.  Musculoskeletal: Negative for back pain and gait problem.  Skin: Positive for wound. Negative for rash.  All other systems reviewed and are negative.  Per HPI unless specifically indicated above     Objective:    BP 125/68   Pulse 81   Temp 98.1 F (36.7 C) (Oral)   Ht 5\' 9"  (1.753 m)   Wt (!) 339 lb (153.8 kg)   BMI 50.06 kg/m   Wt Readings from Last 3 Encounters:  11/19/16 (!) 339 lb (153.8 kg)  11/12/16 (!) 341 lb (154.7 kg)  11/05/16 (!) 341 lb (154.7 kg)    Physical Exam  Constitutional: He is oriented to person, place, and time. He appears well-developed and well-nourished. No distress.  Eyes: Conjunctivae are normal. No scleral icterus.  Musculoskeletal: Normal range of motion. He exhibits no edema.  Neurological: He is alert and oriented to person, place, and time. Coordination normal.  Skin: Skin is warm and dry. Lesion (Area that was in the center of the V of his  laceration has been partially reabsorbed by the body and now has a white granulation tissue base.Marland Kitchen He now has a 2.5 x 2.5 cm open wound. Has appointment with wound care tomorrow) noted. No rash noted. He is not diaphoretic.  Psychiatric: He has a normal mood and affect. His behavior is normal.  Nursing note and vitals reviewed.            Assessment & Plan:   Problem List Items Addressed This Visit    None    Visit Diagnoses    Laceration of left lower extremity, subsequent encounter    -  Primary   Patient going to see wound care tomorrow, wound does look better and has better color, return as needed       Follow up plan: Return if symptoms worsen or fail to improve.  Counseling provided for all of the vaccine components No orders of the defined types were placed in this encounter.   Ryan Pina, MD [facility] 11/19/2016, 8:20 AM

## 2016-11-21 ENCOUNTER — Encounter (HOSPITAL_BASED_OUTPATIENT_CLINIC_OR_DEPARTMENT_OTHER): Payer: Medicare Other | Attending: Surgery

## 2016-11-21 DIAGNOSIS — I10 Essential (primary) hypertension: Secondary | ICD-10-CM | POA: Diagnosis not present

## 2016-11-21 DIAGNOSIS — L97822 Non-pressure chronic ulcer of other part of left lower leg with fat layer exposed: Secondary | ICD-10-CM | POA: Insufficient documentation

## 2016-11-21 DIAGNOSIS — E11622 Type 2 diabetes mellitus with other skin ulcer: Secondary | ICD-10-CM | POA: Insufficient documentation

## 2016-11-21 DIAGNOSIS — Z6841 Body Mass Index (BMI) 40.0 and over, adult: Secondary | ICD-10-CM | POA: Diagnosis not present

## 2016-11-21 DIAGNOSIS — Z87891 Personal history of nicotine dependence: Secondary | ICD-10-CM | POA: Diagnosis not present

## 2016-11-21 DIAGNOSIS — G4730 Sleep apnea, unspecified: Secondary | ICD-10-CM | POA: Diagnosis not present

## 2016-11-21 DIAGNOSIS — I890 Lymphedema, not elsewhere classified: Secondary | ICD-10-CM | POA: Insufficient documentation

## 2016-11-27 ENCOUNTER — Encounter: Payer: Self-pay | Admitting: Family Medicine

## 2016-11-27 ENCOUNTER — Ambulatory Visit (INDEPENDENT_AMBULATORY_CARE_PROVIDER_SITE_OTHER): Payer: Medicare Other | Admitting: Family Medicine

## 2016-11-27 VITALS — BP 133/78 | HR 75 | Temp 98.3°F | Ht 69.0 in | Wt 346.0 lb

## 2016-11-27 DIAGNOSIS — L237 Allergic contact dermatitis due to plants, except food: Secondary | ICD-10-CM

## 2016-11-27 MED ORDER — BETAMETHASONE SOD PHOS & ACET 6 (3-3) MG/ML IJ SUSP
6.0000 mg | Freq: Once | INTRAMUSCULAR | Status: AC
  Administered 2016-11-27: 6 mg via INTRAMUSCULAR

## 2016-11-27 NOTE — Progress Notes (Signed)
   HPI  Patient presents today for 5 days of itching on the left forearm. He was mowing over the weekend and noted some poison ivy along and throat. He started having itching the next day. Rash shortly after. It's been limited to the forearm. Over-the-counter topicals have not helped. In the past it takes a shot the rid of his poison ivy. He requests that today. No systemic symptoms noted.  PMH: Smoking status noted ROS: Per HPI  Objective: BP 133/78   Pulse 75   Temp 98.3 F (36.8 C) (Oral)   Ht 5\' 9"  (1.753 m)   Wt (!) 346 lb (156.9 kg)   BMI 51.10 kg/m  Gen: NAD, alert, cooperative with exam HEENT: NCAT, EOMI, PERRL CV: RRR, good S1/S2, no murmur Resp: CTABL, no wheezes, non-labored Abd: SNTND, BS present, no guarding or organomegaly Ext: No edema, warm Neuro: Alert and oriented, No gross deficits Skin: There is moderate confluent erythema over the ventral wrist and forearm. No sign of infection. Assessment and plan:  Poison ivy dermatitis - Plan: betamethasone acetate-betamethasone sodium phosphate (CELESTONE) injection 6 mg     Meds ordered this encounter  Medications  . betamethasone acetate-betamethasone sodium phosphate (CELESTONE) injection 6 mg

## 2016-11-28 DIAGNOSIS — E11622 Type 2 diabetes mellitus with other skin ulcer: Secondary | ICD-10-CM | POA: Diagnosis not present

## 2016-11-28 DIAGNOSIS — Z6841 Body Mass Index (BMI) 40.0 and over, adult: Secondary | ICD-10-CM | POA: Diagnosis not present

## 2016-11-28 DIAGNOSIS — I890 Lymphedema, not elsewhere classified: Secondary | ICD-10-CM | POA: Diagnosis not present

## 2016-11-28 DIAGNOSIS — I10 Essential (primary) hypertension: Secondary | ICD-10-CM | POA: Diagnosis not present

## 2016-11-28 DIAGNOSIS — L97822 Non-pressure chronic ulcer of other part of left lower leg with fat layer exposed: Secondary | ICD-10-CM | POA: Diagnosis not present

## 2016-12-04 ENCOUNTER — Encounter: Payer: Self-pay | Admitting: Family Medicine

## 2016-12-04 ENCOUNTER — Ambulatory Visit (INDEPENDENT_AMBULATORY_CARE_PROVIDER_SITE_OTHER): Payer: Medicare Other | Admitting: Family Medicine

## 2016-12-04 VITALS — BP 135/69 | HR 71 | Temp 97.4°F | Ht 69.0 in | Wt 344.6 lb

## 2016-12-04 DIAGNOSIS — M545 Low back pain, unspecified: Secondary | ICD-10-CM

## 2016-12-04 DIAGNOSIS — Z6841 Body Mass Index (BMI) 40.0 and over, adult: Secondary | ICD-10-CM | POA: Diagnosis not present

## 2016-12-04 DIAGNOSIS — I10 Essential (primary) hypertension: Secondary | ICD-10-CM | POA: Diagnosis not present

## 2016-12-04 DIAGNOSIS — I890 Lymphedema, not elsewhere classified: Secondary | ICD-10-CM | POA: Diagnosis not present

## 2016-12-04 DIAGNOSIS — E11622 Type 2 diabetes mellitus with other skin ulcer: Secondary | ICD-10-CM | POA: Diagnosis not present

## 2016-12-04 DIAGNOSIS — R490 Dysphonia: Secondary | ICD-10-CM

## 2016-12-04 DIAGNOSIS — L97822 Non-pressure chronic ulcer of other part of left lower leg with fat layer exposed: Secondary | ICD-10-CM | POA: Diagnosis not present

## 2016-12-04 DIAGNOSIS — M542 Cervicalgia: Secondary | ICD-10-CM | POA: Diagnosis not present

## 2016-12-04 MED ORDER — METHOCARBAMOL 500 MG PO TABS: 500.0000 mg | ORAL_TABLET | Freq: Three times a day (TID) | ORAL | 0 refills | Status: DC | PRN

## 2016-12-04 MED ORDER — PREDNISONE 20 MG PO TABS: ORAL_TABLET | ORAL | 0 refills | Status: DC

## 2016-12-04 NOTE — Patient Instructions (Signed)
Great to see you!  Take prednisone 2 pills once daily for 5 days.   Try robaxin as needed.   Come back in about 3-4 weeks.
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
## 2016-12-11 DIAGNOSIS — Z6841 Body Mass Index (BMI) 40.0 and over, adult: Secondary | ICD-10-CM | POA: Diagnosis not present

## 2016-12-11 DIAGNOSIS — E11622 Type 2 diabetes mellitus with other skin ulcer: Secondary | ICD-10-CM | POA: Diagnosis not present

## 2016-12-11 DIAGNOSIS — I10 Essential (primary) hypertension: Secondary | ICD-10-CM | POA: Diagnosis not present

## 2016-12-11 DIAGNOSIS — I890 Lymphedema, not elsewhere classified: Secondary | ICD-10-CM | POA: Diagnosis not present

## 2016-12-11 DIAGNOSIS — L97822 Non-pressure chronic ulcer of other part of left lower leg with fat layer exposed: Secondary | ICD-10-CM | POA: Diagnosis not present

## 2016-12-19 DIAGNOSIS — M48062 Spinal stenosis, lumbar region with neurogenic claudication: Secondary | ICD-10-CM | POA: Diagnosis not present

## 2016-12-19 DIAGNOSIS — M4316 Spondylolisthesis, lumbar region: Secondary | ICD-10-CM | POA: Diagnosis not present

## 2016-12-19 DIAGNOSIS — Z6841 Body Mass Index (BMI) 40.0 and over, adult: Secondary | ICD-10-CM | POA: Diagnosis not present

## 2016-12-19 DIAGNOSIS — M4726 Other spondylosis with radiculopathy, lumbar region: Secondary | ICD-10-CM | POA: Diagnosis not present

## 2016-12-19 DIAGNOSIS — M5136 Other intervertebral disc degeneration, lumbar region: Secondary | ICD-10-CM | POA: Diagnosis not present

## 2016-12-19 DIAGNOSIS — L97822 Non-pressure chronic ulcer of other part of left lower leg with fat layer exposed: Secondary | ICD-10-CM | POA: Diagnosis not present

## 2016-12-19 DIAGNOSIS — M546 Pain in thoracic spine: Secondary | ICD-10-CM | POA: Diagnosis not present

## 2016-12-19 DIAGNOSIS — I10 Essential (primary) hypertension: Secondary | ICD-10-CM | POA: Diagnosis not present

## 2016-12-19 DIAGNOSIS — I890 Lymphedema, not elsewhere classified: Secondary | ICD-10-CM | POA: Diagnosis not present

## 2016-12-19 DIAGNOSIS — E11622 Type 2 diabetes mellitus with other skin ulcer: Secondary | ICD-10-CM | POA: Diagnosis not present

## 2016-12-26 ENCOUNTER — Encounter (HOSPITAL_BASED_OUTPATIENT_CLINIC_OR_DEPARTMENT_OTHER): Payer: Medicare Other | Attending: Surgery

## 2016-12-26 DIAGNOSIS — I10 Essential (primary) hypertension: Secondary | ICD-10-CM | POA: Diagnosis not present

## 2016-12-26 DIAGNOSIS — I890 Lymphedema, not elsewhere classified: Secondary | ICD-10-CM | POA: Insufficient documentation

## 2016-12-26 DIAGNOSIS — Z6841 Body Mass Index (BMI) 40.0 and over, adult: Secondary | ICD-10-CM | POA: Diagnosis not present

## 2016-12-26 DIAGNOSIS — E11622 Type 2 diabetes mellitus with other skin ulcer: Secondary | ICD-10-CM | POA: Insufficient documentation

## 2016-12-26 DIAGNOSIS — L97822 Non-pressure chronic ulcer of other part of left lower leg with fat layer exposed: Secondary | ICD-10-CM | POA: Diagnosis not present

## 2016-12-26 DIAGNOSIS — G4730 Sleep apnea, unspecified: Secondary | ICD-10-CM | POA: Diagnosis not present

## 2017-01-02 DIAGNOSIS — G4730 Sleep apnea, unspecified: Secondary | ICD-10-CM | POA: Diagnosis not present

## 2017-01-02 DIAGNOSIS — L97822 Non-pressure chronic ulcer of other part of left lower leg with fat layer exposed: Secondary | ICD-10-CM | POA: Diagnosis not present

## 2017-01-02 DIAGNOSIS — I890 Lymphedema, not elsewhere classified: Secondary | ICD-10-CM | POA: Diagnosis not present

## 2017-01-02 DIAGNOSIS — I10 Essential (primary) hypertension: Secondary | ICD-10-CM | POA: Diagnosis not present

## 2017-01-02 DIAGNOSIS — E11622 Type 2 diabetes mellitus with other skin ulcer: Secondary | ICD-10-CM | POA: Diagnosis not present

## 2017-01-09 ENCOUNTER — Ambulatory Visit: Payer: Medicare Other | Admitting: Family Medicine

## 2017-01-16 DIAGNOSIS — I10 Essential (primary) hypertension: Secondary | ICD-10-CM | POA: Diagnosis not present

## 2017-01-16 DIAGNOSIS — L97822 Non-pressure chronic ulcer of other part of left lower leg with fat layer exposed: Secondary | ICD-10-CM | POA: Diagnosis not present

## 2017-01-16 DIAGNOSIS — E11622 Type 2 diabetes mellitus with other skin ulcer: Secondary | ICD-10-CM | POA: Diagnosis not present

## 2017-01-16 DIAGNOSIS — I890 Lymphedema, not elsewhere classified: Secondary | ICD-10-CM | POA: Diagnosis not present

## 2017-01-16 DIAGNOSIS — G4730 Sleep apnea, unspecified: Secondary | ICD-10-CM | POA: Diagnosis not present

## 2017-01-17 ENCOUNTER — Encounter: Payer: Self-pay | Admitting: Family Medicine

## 2017-01-17 ENCOUNTER — Ambulatory Visit (INDEPENDENT_AMBULATORY_CARE_PROVIDER_SITE_OTHER): Payer: Medicare Other | Admitting: Family Medicine

## 2017-01-17 VITALS — BP 138/70 | HR 72 | Temp 97.9°F | Ht 69.0 in | Wt 351.6 lb

## 2017-01-17 DIAGNOSIS — I7121 Aneurysm of the ascending aorta, without rupture: Secondary | ICD-10-CM

## 2017-01-17 DIAGNOSIS — I712 Thoracic aortic aneurysm, without rupture: Secondary | ICD-10-CM | POA: Diagnosis not present

## 2017-01-17 DIAGNOSIS — R918 Other nonspecific abnormal finding of lung field: Secondary | ICD-10-CM | POA: Diagnosis not present

## 2017-01-17 DIAGNOSIS — I10 Essential (primary) hypertension: Secondary | ICD-10-CM | POA: Diagnosis not present

## 2017-01-17 DIAGNOSIS — E119 Type 2 diabetes mellitus without complications: Secondary | ICD-10-CM | POA: Diagnosis not present

## 2017-01-17 LAB — BAYER DCA HB A1C WAIVED: HB A1C (BAYER DCA - WAIVED): 6.3 % (ref <7.0)

## 2017-01-17 NOTE — Patient Instructions (Signed)
Great to see you!  We will call or notify you of your lab results within 1 week.   You need repeat testing for lung nodules and the aortic aneurysm in 4 months

## 2017-01-17 NOTE — Progress Notes (Signed)
   HPI  Patient presents today here to follow-up for chronic medical conditions.  Type 2 diabetes Diet controlled, random glucose is been 90s.  Hypertension Good medication compliance, no chest pain or change in leg edema.  Lung nodules Reviewed results of recent CT scan with patient. Ascending aortic aneurysm Reviewed results of recent CT with patient.   PMH: Smoking status noted ROS: Per HPI  Objective: BP 138/70   Pulse 72   Temp 97.9 F (36.6 C) (Oral)   Ht 5\' 9"  (1.753 m)   Wt (!) 351 lb 9.6 oz (159.5 kg)   BMI 51.92 kg/m  Gen: NAD, alert, cooperative with exam HEENT: NCAT CV: RRR, good S1/S2, no murmur Resp: CTABL, no wheezes, non-labored Abd: SNTND, BS present, no guarding or organomegaly Ext:1-2+ pitting edema BL Symmetric, left lower extremity is slightly less erythematous than right lower extremity Neuro: Alert and oriented, No gross deficits  Diabetic Foot Exam - Simple   Simple Foot Form Diabetic Foot exam was performed with the following findings:  Yes 01/17/2017  9:30 AM  Visual Inspection No deformities, no ulcerations, no other skin breakdown bilaterally:  Yes Sensation Testing Intact to touch and monofilament testing bilaterally:  Yes Pulse Check Posterior Tibialis and Dorsalis pulse intact bilaterally:  Yes Comments      Assessment and plan:  # Hypertension Well-controlled on Prinzide Labs up-to-date No changes  # Type 2 diabetes Diet controlled, A1c 6.5 previous lab. No changes   # Lung nodules Multiple lung nodules, hopefully only scarring from granulomatous disease or chronic lung disease Repeat CT scan 6 months from previous  # Ace ascending aortic aneurysm Discussed usual screening protocol We'll try to recheck this with the lung nodules with CT angiogram, at least annual checks     Orders Placed This Encounter  Procedures  . Microalbumin / creatinine urine ratio  . [facility] Hb A1c Carney Bern,  MD [facility] 01/17/2017, 9:31 AM

## 2017-01-18 ENCOUNTER — Encounter: Payer: Self-pay | Admitting: Family Medicine

## 2017-01-18 LAB — MICROALBUMIN / CREATININE URINE RATIO
Creatinine, Urine: 49.4 mg/dL
Microalb/Creat Ratio: <6.1 mg/g creat (ref 0.0–30.0)
Microalbumin, Urine: <3 ug/mL

## 2017-02-06 ENCOUNTER — Encounter (HOSPITAL_BASED_OUTPATIENT_CLINIC_OR_DEPARTMENT_OTHER): Payer: Medicare Other | Attending: Surgery

## 2017-02-06 DIAGNOSIS — L97822 Non-pressure chronic ulcer of other part of left lower leg with fat layer exposed: Secondary | ICD-10-CM | POA: Diagnosis not present

## 2017-02-06 DIAGNOSIS — Z6841 Body Mass Index (BMI) 40.0 and over, adult: Secondary | ICD-10-CM | POA: Diagnosis not present

## 2017-02-06 DIAGNOSIS — I890 Lymphedema, not elsewhere classified: Secondary | ICD-10-CM | POA: Insufficient documentation

## 2017-02-06 DIAGNOSIS — E11622 Type 2 diabetes mellitus with other skin ulcer: Secondary | ICD-10-CM | POA: Insufficient documentation

## 2017-02-13 DIAGNOSIS — I890 Lymphedema, not elsewhere classified: Secondary | ICD-10-CM | POA: Diagnosis not present

## 2017-02-13 DIAGNOSIS — L97822 Non-pressure chronic ulcer of other part of left lower leg with fat layer exposed: Secondary | ICD-10-CM | POA: Diagnosis not present

## 2017-02-13 DIAGNOSIS — E11622 Type 2 diabetes mellitus with other skin ulcer: Secondary | ICD-10-CM | POA: Diagnosis not present

## 2017-02-13 DIAGNOSIS — Z6841 Body Mass Index (BMI) 40.0 and over, adult: Secondary | ICD-10-CM | POA: Diagnosis not present

## 2017-02-20 ENCOUNTER — Encounter (HOSPITAL_BASED_OUTPATIENT_CLINIC_OR_DEPARTMENT_OTHER): Payer: Medicare Other | Attending: Surgery

## 2017-02-20 DIAGNOSIS — E11622 Type 2 diabetes mellitus with other skin ulcer: Secondary | ICD-10-CM | POA: Insufficient documentation

## 2017-02-20 DIAGNOSIS — S81812A Laceration without foreign body, left lower leg, initial encounter: Secondary | ICD-10-CM | POA: Diagnosis not present

## 2017-02-20 DIAGNOSIS — G4730 Sleep apnea, unspecified: Secondary | ICD-10-CM | POA: Diagnosis not present

## 2017-02-20 DIAGNOSIS — I10 Essential (primary) hypertension: Secondary | ICD-10-CM | POA: Insufficient documentation

## 2017-02-20 DIAGNOSIS — Z6841 Body Mass Index (BMI) 40.0 and over, adult: Secondary | ICD-10-CM | POA: Insufficient documentation

## 2017-02-20 DIAGNOSIS — L97822 Non-pressure chronic ulcer of other part of left lower leg with fat layer exposed: Secondary | ICD-10-CM | POA: Diagnosis not present

## 2017-02-20 DIAGNOSIS — I890 Lymphedema, not elsewhere classified: Secondary | ICD-10-CM | POA: Insufficient documentation

## 2017-02-22 ENCOUNTER — Other Ambulatory Visit: Payer: Self-pay | Admitting: Neurosurgery

## 2017-02-22 DIAGNOSIS — I10 Essential (primary) hypertension: Secondary | ICD-10-CM | POA: Diagnosis not present

## 2017-02-22 DIAGNOSIS — R499 Unspecified voice and resonance disorder: Secondary | ICD-10-CM | POA: Diagnosis not present

## 2017-02-22 DIAGNOSIS — M48062 Spinal stenosis, lumbar region with neurogenic claudication: Secondary | ICD-10-CM

## 2017-02-22 DIAGNOSIS — M4726 Other spondylosis with radiculopathy, lumbar region: Secondary | ICD-10-CM | POA: Diagnosis not present

## 2017-02-22 DIAGNOSIS — M4316 Spondylolisthesis, lumbar region: Secondary | ICD-10-CM | POA: Diagnosis not present

## 2017-02-22 DIAGNOSIS — Z6841 Body Mass Index (BMI) 40.0 and over, adult: Secondary | ICD-10-CM | POA: Diagnosis not present

## 2017-02-22 DIAGNOSIS — M5136 Other intervertebral disc degeneration, lumbar region: Secondary | ICD-10-CM | POA: Diagnosis not present

## 2017-02-27 ENCOUNTER — Ambulatory Visit: Payer: Medicare Other

## 2017-02-27 DIAGNOSIS — G4730 Sleep apnea, unspecified: Secondary | ICD-10-CM | POA: Diagnosis not present

## 2017-02-27 DIAGNOSIS — E11622 Type 2 diabetes mellitus with other skin ulcer: Secondary | ICD-10-CM | POA: Diagnosis not present

## 2017-02-27 DIAGNOSIS — L97822 Non-pressure chronic ulcer of other part of left lower leg with fat layer exposed: Secondary | ICD-10-CM | POA: Diagnosis not present

## 2017-02-27 DIAGNOSIS — I890 Lymphedema, not elsewhere classified: Secondary | ICD-10-CM | POA: Diagnosis not present

## 2017-02-27 DIAGNOSIS — I10 Essential (primary) hypertension: Secondary | ICD-10-CM | POA: Diagnosis not present

## 2017-03-01 DIAGNOSIS — M542 Cervicalgia: Secondary | ICD-10-CM | POA: Diagnosis not present

## 2017-03-01 DIAGNOSIS — Z87828 Personal history of other (healed) physical injury and trauma: Secondary | ICD-10-CM | POA: Insufficient documentation

## 2017-03-02 ENCOUNTER — Ambulatory Visit
Admission: RE | Admit: 2017-03-02 | Discharge: 2017-03-02 | Disposition: A | Discharge status: Home / Self Care | Payer: Medicare Other | Source: Ambulatory Visit | Attending: Neurosurgery | Admitting: Neurosurgery

## 2017-03-02 DIAGNOSIS — M48061 Spinal stenosis, lumbar region without neurogenic claudication: Secondary | ICD-10-CM | POA: Diagnosis not present

## 2017-03-02 DIAGNOSIS — M48062 Spinal stenosis, lumbar region with neurogenic claudication: Secondary | ICD-10-CM

## 2017-03-03 ENCOUNTER — Other Ambulatory Visit: Payer: Self-pay | Admitting: Family Medicine

## 2017-03-06 DIAGNOSIS — E11622 Type 2 diabetes mellitus with other skin ulcer: Secondary | ICD-10-CM | POA: Diagnosis not present

## 2017-03-06 DIAGNOSIS — G4730 Sleep apnea, unspecified: Secondary | ICD-10-CM | POA: Diagnosis not present

## 2017-03-06 DIAGNOSIS — I10 Essential (primary) hypertension: Secondary | ICD-10-CM | POA: Diagnosis not present

## 2017-03-06 DIAGNOSIS — S81802A Unspecified open wound, left lower leg, initial encounter: Secondary | ICD-10-CM | POA: Diagnosis not present

## 2017-03-06 DIAGNOSIS — L97822 Non-pressure chronic ulcer of other part of left lower leg with fat layer exposed: Secondary | ICD-10-CM | POA: Diagnosis not present

## 2017-03-06 DIAGNOSIS — I890 Lymphedema, not elsewhere classified: Secondary | ICD-10-CM | POA: Diagnosis not present

## 2017-03-27 ENCOUNTER — Encounter (HOSPITAL_BASED_OUTPATIENT_CLINIC_OR_DEPARTMENT_OTHER): Payer: Medicare Other | Attending: Surgery

## 2017-03-27 DIAGNOSIS — E11622 Type 2 diabetes mellitus with other skin ulcer: Secondary | ICD-10-CM | POA: Diagnosis not present

## 2017-03-27 DIAGNOSIS — L97829 Non-pressure chronic ulcer of other part of left lower leg with unspecified severity: Secondary | ICD-10-CM | POA: Insufficient documentation

## 2017-03-27 DIAGNOSIS — I890 Lymphedema, not elsewhere classified: Secondary | ICD-10-CM | POA: Diagnosis not present

## 2017-03-27 DIAGNOSIS — S81802A Unspecified open wound, left lower leg, initial encounter: Secondary | ICD-10-CM | POA: Diagnosis not present

## 2017-04-02 ENCOUNTER — Ambulatory Visit (INDEPENDENT_AMBULATORY_CARE_PROVIDER_SITE_OTHER): Payer: Medicare Other | Admitting: Physician Assistant

## 2017-04-02 ENCOUNTER — Encounter: Payer: Self-pay | Admitting: Physician Assistant

## 2017-04-02 VITALS — BP 127/71 | HR 80 | Temp 98.4°F | Ht 69.0 in | Wt 350.0 lb

## 2017-04-02 DIAGNOSIS — L237 Allergic contact dermatitis due to plants, except food: Secondary | ICD-10-CM | POA: Diagnosis not present

## 2017-04-02 MED ORDER — METHYLPREDNISOLONE ACETATE 80 MG/ML IJ SUSP
80.0000 mg | Freq: Once | INTRAMUSCULAR | Status: AC
  Administered 2017-04-02: 80 mg via INTRAMUSCULAR

## 2017-04-02 NOTE — Patient Instructions (Signed)
In a few days you may receive a survey in the mail or online from [employer] regarding your visit with us today. Please take a moment to fill this out. Your feedback is very important to our whole office. It can help us better understand your needs as well as improve your experience and satisfaction. Thank you for taking your time to complete it. We care about you.  Telina Kleckley, PA-C  

## 2017-04-02 NOTE — Progress Notes (Signed)
BP 127/71   Pulse 80   Temp 98.4 F (36.9 C) (Oral)   Ht 5\' 9"  (1.753 m)   Wt (!) 350 lb (158.8 kg)   BMI 51.69 kg/m    Subjective:    Patient ID: Ryan Garner, male    DOB: [birth year], 71 y.o.   MRN: [id number]  HPI: Ryan Garner is a 71 y.o. male presenting on 04/02/2017 for Rash  He has had ever growing rash on his legs arms and trunk. He knows he was exposed to poison oak a few days ago. He will sometimes get quite severe and requires an injection. He is having significant itching at this time.  Relevant past medical, surgical, family and social history reviewed and updated as indicated. Allergies and medications reviewed and updated.  Past Medical History:  Diagnosis Date  . Hyperlipidemia   . Hypertension   . Metabolic syndrome   . Metabolic syndrome   . Prediabetes     Past Surgical History:  Procedure Laterality Date  . Eyelid Surgery Bilateral   . HERNIA REPAIR    . knee tendons repair    . ROTATOR CUFF REPAIR Bilateral   . TONSILLECTOMY      Review of Systems  Constitutional: Negative.  Negative for appetite change and fatigue.  Eyes: Negative for pain and visual disturbance.  Respiratory: Negative.  Negative for cough, chest tightness, shortness of breath and wheezing.   Cardiovascular: Negative.  Negative for chest pain, palpitations and leg swelling.  Gastrointestinal: Negative.  Negative for abdominal pain, diarrhea, nausea and vomiting.  Genitourinary: Negative.   Skin: Positive for color change and rash.  Neurological: Negative.  Negative for weakness, numbness and headaches.  Psychiatric/Behavioral: Negative.     Allergies as of 04/02/2017      Reactions   Rofecoxib Other (See Comments)      Medication List       Accurate as of 04/02/17  4:13 PM. Always use your most recent med list.          bacitracin ointment Apply 1 application topically 2 (two) times daily.   fluticasone 50 MCG/ACT nasal spray Commonly known as:  FLONASE Place 2  sprays into both nostrils daily.   JOINT HEALTH Caps Take 1 capsule by mouth daily.   lisinopril-hydrochlorothiazide 10-12.5 MG tablet Commonly known as:  PRINZIDE,ZESTORETIC Take 1 tablet by mouth daily.   methocarbamol 500 MG tablet Commonly known as:  ROBAXIN TAKE 1 TABLET BY MOUTH EVERY 8 HOURS AS NEEDED FOR  MUSCLE  ACHES   multivitamin with minerals Tabs tablet Take 1 tablet by mouth daily.            Discharge Care Instructions        Start     Ordered   04/02/17 1615  METHYLPREDNISOLONE ACETATE 80 MG/ML IJ SUSP   Once     04/02/17 1612         Objective:    BP 127/71   Pulse 80   Temp 98.4 F (36.9 C) (Oral)   Ht 5\' 9"  (1.753 m)   Wt (!) 350 lb (158.8 kg)   BMI 51.69 kg/m   Allergies  Allergen Reactions  . Rofecoxib Other (See Comments)    Physical Exam  Constitutional: He appears well-developed and well-nourished. No distress.  HENT:  Head: Normocephalic and atraumatic.  Eyes: Pupils are equal, round, and reactive to light. Conjunctivae and EOM are normal.  Cardiovascular: Normal rate, regular rhythm and normal heart sounds.   Pulmonary/Chest:  Effort normal and breath sounds normal. No respiratory distress.  Skin: Skin is warm and dry. Lesion and rash noted. Rash is pustular. There is erythema.  Patches of erythematous blisters on red base on arms legs and trunk  Psychiatric: He has a normal mood and affect. His behavior is normal.  Nursing note and vitals reviewed.       Assessment & Plan:   1. Allergic contact dermatitis due to plants, except food - methylPREDNISolone acetate (DEPO-MEDROL) injection 80 mg; Inject 1 mL (80 mg total) into the muscle once.    Current Outpatient Prescriptions:  .  bacitracin ointment, Apply 1 application topically 2 (two) times daily., Disp: 120 g, Rfl: 0 .  fluticasone (FLONASE) 50 MCG/ACT nasal spray, Place 2 sprays into both nostrils daily. (Patient taking differently: Place 2 sprays into both nostrils  daily as needed for allergies. ), Disp: 16 g, Rfl: 6 .  lisinopril-hydrochlorothiazide (PRINZIDE,ZESTORETIC) 10-12.5 MG tablet, Take 1 tablet by mouth daily., Disp: 90 tablet, Rfl: 3 .  methocarbamol (ROBAXIN) 500 MG tablet, TAKE 1 TABLET BY MOUTH EVERY 8 HOURS AS NEEDED FOR  MUSCLE  ACHES, Disp: 60 tablet, Rfl: 0 .  Misc Natural Products (JOINT HEALTH) CAPS, Take 1 capsule by mouth daily., Disp: , Rfl:  .  Multiple Vitamin (MULTIVITAMIN WITH MINERALS) TABS tablet, Take 1 tablet by mouth daily., Disp: , Rfl:   Current Facility-Administered Medications:  .  methylPREDNISolone acetate (DEPO-MEDROL) injection 80 mg, 80 mg, Intramuscular, Once, Almendra Loria S, PA-C Continue all other maintenance medications as listed above.  Follow up plan: Follow-up as needed or worsening of symptoms. Call office for any issues.   Educational handout given for Phillips PA-C [facility] [address]  [place], [place] [address] [phone]   04/02/2017, 4:13 PM

## 2017-04-03 DIAGNOSIS — M4316 Spondylolisthesis, lumbar region: Secondary | ICD-10-CM | POA: Diagnosis not present

## 2017-04-03 DIAGNOSIS — M48062 Spinal stenosis, lumbar region with neurogenic claudication: Secondary | ICD-10-CM | POA: Diagnosis not present

## 2017-04-03 DIAGNOSIS — M5136 Other intervertebral disc degeneration, lumbar region: Secondary | ICD-10-CM | POA: Diagnosis not present

## 2017-04-03 DIAGNOSIS — S81812A Laceration without foreign body, left lower leg, initial encounter: Secondary | ICD-10-CM | POA: Diagnosis not present

## 2017-04-03 DIAGNOSIS — L97829 Non-pressure chronic ulcer of other part of left lower leg with unspecified severity: Secondary | ICD-10-CM | POA: Diagnosis not present

## 2017-04-03 DIAGNOSIS — M4726 Other spondylosis with radiculopathy, lumbar region: Secondary | ICD-10-CM | POA: Diagnosis not present

## 2017-04-03 DIAGNOSIS — E11622 Type 2 diabetes mellitus with other skin ulcer: Secondary | ICD-10-CM | POA: Diagnosis not present

## 2017-05-23 DIAGNOSIS — M4726 Other spondylosis with radiculopathy, lumbar region: Secondary | ICD-10-CM | POA: Diagnosis not present

## 2017-05-23 DIAGNOSIS — M48061 Spinal stenosis, lumbar region without neurogenic claudication: Secondary | ICD-10-CM | POA: Diagnosis not present

## 2017-05-23 DIAGNOSIS — M5136 Other intervertebral disc degeneration, lumbar region: Secondary | ICD-10-CM | POA: Diagnosis not present

## 2017-06-01 ENCOUNTER — Other Ambulatory Visit: Payer: Self-pay | Admitting: Family Medicine

## 2017-08-15 DIAGNOSIS — L570 Actinic keratosis: Secondary | ICD-10-CM | POA: Diagnosis not present

## 2017-08-26 ENCOUNTER — Encounter: Payer: Self-pay | Admitting: Nurse Practitioner

## 2017-08-26 ENCOUNTER — Ambulatory Visit (INDEPENDENT_AMBULATORY_CARE_PROVIDER_SITE_OTHER): Payer: Medicare Other | Admitting: Nurse Practitioner

## 2017-08-26 VITALS — BP 143/77 | HR 65 | Temp 98.1°F | Ht 69.0 in | Wt 346.0 lb

## 2017-08-26 DIAGNOSIS — J0100 Acute maxillary sinusitis, unspecified: Secondary | ICD-10-CM | POA: Diagnosis not present

## 2017-08-26 MED ORDER — AMOXICILLIN-POT CLAVULANATE 875-125 MG PO TABS: 1.0000 | ORAL_TABLET | Freq: Two times a day (BID) | ORAL | 0 refills | Status: DC

## 2017-08-26 MED ORDER — BENZONATATE 100 MG PO CAPS: 100.0000 mg | ORAL_CAPSULE | Freq: Three times a day (TID) | ORAL | 0 refills | Status: DC | PRN

## 2017-08-26 NOTE — Progress Notes (Signed)
Subjective:    Patient ID: Ryan Garner, male    DOB: 03-14-1946, 72 y.o.   MRN: [id number]  HPI Patient comes in today c/o facial pressure, sinus drainage and watery eyes. Started over a week ago. Has tried some generic zyrtec which did not help. Unsure of fever.    Review of Systems  Constitutional: Negative for appetite change, chills and fever.  HENT: Positive for congestion, postnasal drip, rhinorrhea, sinus pressure, sinus pain and sneezing. Negative for sore throat and trouble swallowing.   Respiratory: Positive for cough (productive - yellowish).   Cardiovascular: Negative.   Gastrointestinal: Negative.   Neurological: Negative.   Psychiatric/Behavioral: Negative.   All other systems reviewed and are negative.      Objective:   Physical Exam  Constitutional: He is oriented to person, place, and time. He appears well-developed and well-nourished. No distress.  HENT:  Right Ear: Hearing, tympanic membrane, external ear and ear canal normal.  Left Ear: Hearing, tympanic membrane, external ear and ear canal normal.  Nose: Mucosal edema and rhinorrhea present. Right sinus exhibits maxillary sinus tenderness. Right sinus exhibits no frontal sinus tenderness. Left sinus exhibits maxillary sinus tenderness. Left sinus exhibits no frontal sinus tenderness.  Mouth/Throat: Uvula is midline, oropharynx is clear and moist and mucous membranes are normal.  Neck: Normal range of motion. Neck supple. No thyromegaly present.  Cardiovascular: Normal rate and regular rhythm.  Pulmonary/Chest: Effort normal and breath sounds normal.  Abdominal: Soft.  Neurological: He is alert and oriented to person, place, and time.  Skin: Skin is warm.  Psychiatric: He has a normal mood and affect. His behavior is normal. Judgment and thought content normal.   BP (!) 143/77   Pulse 65   Temp 98.1 F (36.7 C) (Oral)   Ht 5\' 9"  (1.753 m)   Wt (!) 346 lb (156.9 kg)   BMI 51.10 kg/m      Assessment &  Plan:   1. Acute maxillary sinusitis, recurrence not specified    Meds ordered this encounter  Medications  . amoxicillin-clavulanate (AUGMENTIN) 875-125 MG tablet    Sig: Take 1 tablet by mouth 2 (two) times daily.    Dispense:  20 tablet    Refill:  0    Order Specific Question:   Supervising Provider    Answer:   VINCENT, CAROL L [[id number]]  . benzonatate (TESSALON PERLES) 100 MG capsule    Sig: Take 1 capsule (100 mg total) by mouth 3 (three) times daily as needed for cough.    Dispense:  20 capsule    Refill:  0    Order Specific Question:   Supervising Provider    Answer:   VINCENT, CAROL L [[id number]]   1. Take meds as prescribed 2. Use a cool mist humidifier especially during the winter months and when heat has been humid. 3. Use saline nose sprays frequently 4. Saline irrigations of the nose can be very helpful if done frequently.  * 4X daily for 1 week*  * Use of a nettie pot can be helpful with this. Follow directions with this* 5. Drink plenty of fluids 6. Keep thermostat turn down low 7.For any cough or congestion  Use plain Mucinex- regular strength or max strength is fine   * Children- consult with Pharmacist for dosing 8. For fever or aces or pains- take tylenol or ibuprofen appropriate for age and weight.  * for fevers greater than 101 orally you may alternate ibuprofen and tylenol every  3  hours.   Mary-Margaret Hassell Done, FNP

## 2017-08-26 NOTE — Patient Instructions (Signed)

## 2017-09-02 ENCOUNTER — Other Ambulatory Visit: Payer: Self-pay | Admitting: Family Medicine

## 2017-09-23 ENCOUNTER — Ambulatory Visit: Payer: Medicare Other | Admitting: Nurse Practitioner

## 2017-09-24 ENCOUNTER — Ambulatory Visit (INDEPENDENT_AMBULATORY_CARE_PROVIDER_SITE_OTHER): Payer: Medicare Other | Admitting: Family Medicine

## 2017-09-24 ENCOUNTER — Encounter: Payer: Self-pay | Admitting: Family Medicine

## 2017-09-24 VITALS — BP 127/78 | HR 72 | Temp 96.8°F | Ht 69.0 in | Wt 346.0 lb

## 2017-09-24 DIAGNOSIS — J0100 Acute maxillary sinusitis, unspecified: Secondary | ICD-10-CM | POA: Diagnosis not present

## 2017-09-24 MED ORDER — BENZONATATE 100 MG PO CAPS: 100.0000 mg | ORAL_CAPSULE | Freq: Three times a day (TID) | ORAL | 0 refills | Status: DC | PRN

## 2017-09-24 MED ORDER — AMOXICILLIN-POT CLAVULANATE 875-125 MG PO TABS: 1.0000 | ORAL_TABLET | Freq: Two times a day (BID) | ORAL | 0 refills | Status: DC

## 2017-09-24 NOTE — Progress Notes (Signed)
Subjective: CC: ?sinus infection PCP: Timmothy Euler, MD [id number] Ryan Garner is a 72 y.o. male presenting to clinic today for:  1. Sinus symptoms Patient was seen on 08/26/2017 for acute bacterial sinusitis affecting the maxillary sinuses.  He was placed on Augmentin p.o. twice daily for 10 days and Tessalon Perles 100 mg 3 times daily as needed cough.  He notes persistent symptoms, including headache, facial pressure, productive cough without hemoptysis, throat irritation and subjective fevers.  He denies myalgia, decreased p.o. intake, diarrhea, nausea, vomiting.  He notes that he typically needs a prolonged course of Augmentin in order to get over his sinus infections each year.  He has seen ear nose and throat in the past who had no additional input per his report.  Reports intermittent use of Flonase.   ROS: Per HPI  Allergies  Allergen Reactions  . Rofecoxib Other (See Comments)   Past Medical History:  Diagnosis Date  . Hyperlipidemia   . Hypertension   . Metabolic syndrome   . Metabolic syndrome   . Prediabetes     Current Outpatient Medications:  .  amoxicillin-clavulanate (AUGMENTIN) 875-125 MG tablet, Take 1 tablet by mouth 2 (two) times daily., Disp: 20 tablet, Rfl: 0 .  bacitracin ointment, Apply 1 application topically 2 (two) times daily., Disp: 120 g, Rfl: 0 .  benzonatate (TESSALON PERLES) 100 MG capsule, Take 1 capsule (100 mg total) by mouth 3 (three) times daily as needed for cough., Disp: 20 capsule, Rfl: 0 .  fluticasone (FLONASE) 50 MCG/ACT nasal spray, Place 2 sprays into both nostrils daily. (Patient taking differently: Place 2 sprays into both nostrils daily as needed for allergies. ), Disp: 16 g, Rfl: 6 .  lisinopril-hydrochlorothiazide (PRINZIDE,ZESTORETIC) 10-12.5 MG tablet, TAKE 1 TABLET BY MOUTH ONCE DAILY, Disp: 90 tablet, Rfl: 0 .  methocarbamol (ROBAXIN) 500 MG tablet, TAKE 1 TABLET BY MOUTH EVERY 8 HOURS AS NEEDED FOR  MUSCLE  ACHES, Disp: 60  tablet, Rfl: 0 .  Misc Natural Products (JOINT HEALTH) CAPS, Take 1 capsule by mouth daily., Disp: , Rfl:  .  Multiple Vitamin (MULTIVITAMIN WITH MINERALS) TABS tablet, Take 1 tablet by mouth daily., Disp: , Rfl:  Social History   Socioeconomic History  . Marital status: Married    Spouse name: Not on file  . Number of children: Not on file  . Years of education: Not on file  . Highest education level: Not on file  Social Needs  . Financial resource strain: Not on file  . Food insecurity - worry: Not on file  . Food insecurity - inability: Not on file  . Transportation needs - medical: Not on file  . Transportation needs - non-medical: Not on file  Occupational History  . Not on file  Tobacco Use  . Smoking status: Former Research scientist (life sciences)  . Smokeless tobacco: Never Used  Substance and Sexual Activity  . Alcohol use: No  . Drug use: No  . Sexual activity: Not on file  Other Topics Concern  . Not on file  Social History Narrative  . Not on file   Family History  Problem Relation Age of Onset  . Cancer Mother        lungs  . Coronary artery disease Mother   . Diabetes Brother   . Diabetes Maternal Grandmother     Objective: Office vital signs reviewed. BP 127/78   Pulse 72   Temp (!) 96.8 F (36 C) (Oral)   Ht 5\' 9"  (1.753 m)  Wt (!) 346 lb (156.9 kg)   BMI 51.10 kg/m   Physical Examination:  General: Awake, alert, obese, No acute distress HEENT: +TTP to maxillary and frontal sinuses    Neck: No masses palpated. No lymphadenopathy    Ears: Tympanic membranes intact, normal light reflex, no erythema, no bulging    Eyes: PERRLA, extraocular membranes intact, sclera white    Nose: nasal turbinates moist, clear nasal discharge    Throat: moist mucus membranes, mild oropharyngeal erythema, no tonsillar exudate.  Airway is patent Cardio: regular rate and rhythm, S1S2 heard, no murmurs appreciated Pulm: clear to auscultation bilaterally, no wheezes, rhonchi or rales; normal  work of breathing on room air  Assessment/ Plan: 72 y.o. male   1. Acute maxillary sinusitis, recurrence not specified Subacute bacterial sinusitis.  Discussed with PCP.  Will refill Augmentin and Tessalon Perles.  If persistent symptoms or worsening symptoms, would consider referral back to ear nose and throat for further evaluation and management.  Home care instructions reviewed with the patient.  He was good understanding will follow-up as needed. - amoxicillin-clavulanate (AUGMENTIN) 875-125 MG tablet; Take 1 tablet by mouth 2 (two) times daily.  Dispense: 20 tablet; Refill: 0 - benzonatate (TESSALON PERLES) 100 MG capsule; Take 1 capsule (100 mg total) by mouth 3 (three) times daily as needed for cough.  Dispense: 20 capsule; Refill: 0    Meds ordered this encounter  Medications  . amoxicillin-clavulanate (AUGMENTIN) 875-125 MG tablet    Sig: Take 1 tablet by mouth 2 (two) times daily.    Dispense:  20 tablet    Refill:  0  . benzonatate (TESSALON PERLES) 100 MG capsule    Sig: Take 1 capsule (100 mg total) by mouth 3 (three) times daily as needed for cough.    Dispense:  20 capsule    Refill:  [place]an[place], DO [place] [phone]

## 2017-09-24 NOTE — Patient Instructions (Signed)
I discussed your care with your primary care doctor.  I have refilled both the Augmentin and the Tessalon for you.  If your symptoms are not improving or do not resolve please follow-up with Dr. Wendi Snipes.  You may need to see the ear nose and throat doctor again.  Please use your Flonase daily as prescribed.  Consider performing sinus rinses with a Nettie pot.   Sinusitis, Adult Sinusitis is soreness and inflammation of your sinuses. Sinuses are hollow spaces in the bones around your face. They are located:  Around your eyes.  In the middle of your forehead.  Behind your nose.  In your cheekbones.  Your sinuses and nasal passages are lined with a stringy fluid (mucus). Mucus normally drains out of your sinuses. When your nasal tissues get inflamed or swollen, the mucus can get trapped or blocked so air cannot flow through your sinuses. This lets bacteria, viruses, and funguses grow, and that leads to infection. Follow these instructions at home: Medicines  Take, use, or apply over-the-counter and prescription medicines only as told by your doctor. These may include nasal sprays.  If you were prescribed an antibiotic medicine, take it as told by your doctor. Do not stop taking the antibiotic even if you start to feel better. Hydrate and Humidify  Drink enough water to keep your pee (urine) clear or pale yellow.  Use a cool mist humidifier to keep the humidity level in your home above 50%.  Breathe in steam for 10-15 minutes, 3-4 times a day or as told by your doctor. You can do this in the bathroom while a hot shower is running.  Try not to spend time in cool or dry air. Rest  Rest as much as possible.  Sleep with your head raised (elevated).  Make sure to get enough sleep each night. General instructions  Put a warm, moist washcloth on your face 3-4 times a day or as told by your doctor. This will help with discomfort.  Wash your hands often with soap and water. If there is  no soap and water, use hand sanitizer.  Do not smoke. Avoid being around people who are smoking (secondhand smoke).  Keep all follow-up visits as told by your doctor. This is important. Contact a doctor if:  You have a fever.  Your symptoms get worse.  Your symptoms do not get better within 10 days. Get help right away if:  You have a very bad headache.  You cannot stop throwing up (vomiting).  You have pain or swelling around your face or eyes.  You have trouble seeing.  You feel confused.  Your neck is stiff.  You have trouble breathing. This information is not intended to replace advice given to you by your health care provider. Make sure you discuss any questions you have with your health care provider. Document Released: 12/26/2007 Document Revised: 03/04/2016 Document Reviewed: 05/04/2015 Elsevier Interactive Patient Education  [employer].

## 2017-10-03 ENCOUNTER — Other Ambulatory Visit: Payer: Self-pay | Admitting: Family Medicine

## 2017-10-03 DIAGNOSIS — J0100 Acute maxillary sinusitis, unspecified: Secondary | ICD-10-CM

## 2017-10-04 DIAGNOSIS — M5136 Other intervertebral disc degeneration, lumbar region: Secondary | ICD-10-CM | POA: Diagnosis not present

## 2017-10-04 DIAGNOSIS — Z6841 Body Mass Index (BMI) 40.0 and over, adult: Secondary | ICD-10-CM | POA: Diagnosis not present

## 2017-10-04 DIAGNOSIS — M4316 Spondylolisthesis, lumbar region: Secondary | ICD-10-CM | POA: Diagnosis not present

## 2017-10-04 DIAGNOSIS — M48062 Spinal stenosis, lumbar region with neurogenic claudication: Secondary | ICD-10-CM | POA: Diagnosis not present

## 2017-10-04 DIAGNOSIS — M4726 Other spondylosis with radiculopathy, lumbar region: Secondary | ICD-10-CM | POA: Diagnosis not present

## 2017-10-04 DIAGNOSIS — I10 Essential (primary) hypertension: Secondary | ICD-10-CM | POA: Diagnosis not present

## 2017-10-04 NOTE — Telephone Encounter (Signed)
last seen 09/24/17

## 2017-10-23 ENCOUNTER — Encounter: Payer: Self-pay | Admitting: *Deleted

## 2017-10-23 ENCOUNTER — Ambulatory Visit (INDEPENDENT_AMBULATORY_CARE_PROVIDER_SITE_OTHER): Payer: Medicare Other | Admitting: *Deleted

## 2017-10-23 VITALS — BP 145/77 | HR 76 | Ht 66.5 in | Wt 346.0 lb

## 2017-10-23 DIAGNOSIS — Z0000 Encounter for general adult medical examination without abnormal findings: Secondary | ICD-10-CM
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
## 2017-10-23 NOTE — Patient Instructions (Signed)
  Mr. Norrod , Thank you for taking time to come for your Medicare Wellness Visit. I appreciate your ongoing commitment to your health goals. Please review the following plan we discussed and let me know if I can assist you in the future.   These are the goals we discussed: Goals    . Exercise 150 min/wk Moderate Activity       This is a list of the screening recommended for you and due dates:  Health Maintenance  Topic Date Due  . Eye exam for diabetics  01/26/1954  . Stool Blood Test  08/11/2014  . Colon Cancer Screening  08/26/2014  . Hemoglobin A1C  07/19/2017  . Flu Shot  04/30/2018*  . Complete foot exam   01/17/2018  . Tetanus Vaccine  07/30/2026  .  Hepatitis C: One time screening is recommended by Center for Disease Control  (CDC) for  adults born from [birth year] through 1965.   Completed  . Pneumonia vaccines  Completed  *Topic was postponed. The date shown is not the original due date.

## 2017-12-02 ENCOUNTER — Other Ambulatory Visit: Payer: Self-pay | Admitting: Family Medicine

## 2017-12-04 ENCOUNTER — Encounter: Payer: Self-pay | Admitting: Pediatrics

## 2017-12-04 ENCOUNTER — Ambulatory Visit (INDEPENDENT_AMBULATORY_CARE_PROVIDER_SITE_OTHER): Payer: Medicare Other | Admitting: Pediatrics

## 2017-12-04 VITALS — BP 130/77 | HR 78 | Temp 97.0°F | Resp 22 | Ht 66.5 in | Wt 341.6 lb

## 2017-12-04 DIAGNOSIS — J329 Chronic sinusitis, unspecified: Secondary | ICD-10-CM

## 2017-12-04 MED ORDER — AMOXICILLIN-POT CLAVULANATE 875-125 MG PO TABS: 1.0000 | ORAL_TABLET | Freq: Two times a day (BID) | ORAL | 0 refills | Status: AC

## 2017-12-04 NOTE — Progress Notes (Signed)
  Subjective:   Patient ID: Ryan Garner, male    DOB: [birth year], 72 y.o.   MRN: [id number] CC: Nasal Congestion  HPI: Ryan Garner is a 72 y.o. male   Symptoms started a little over week ago.  Has been getting worse for last couple days.  Nasal congestion and discharge bothering him the most.  No fevers.  Appetite is been down.  Coughing some off and on.  No shortness of breath.  Relevant past medical, surgical, family and social history reviewed. Allergies and medications reviewed and updated. Social History   Tobacco Use  Smoking Status Former Smoker  Smokeless Tobacco Never Used   ROS: Per HPI   Objective:    BP 130/77   Pulse 78   Temp (!) 97 F (36.1 C) (Oral)   Ht 5' 6.5" (1.689 m)   Wt (!) 341 lb 9.6 oz (154.9 kg)   BMI 54.31 kg/m   Wt Readings from Last 3 Encounters:  12/04/17 (!) 341 lb 9.6 oz (154.9 kg)  10/23/17 (!) 346 lb (156.9 kg)  09/24/17 (!) 346 lb (156.9 kg)    Gen: NAD, alert, cooperative with exam, NCAT EYES: EOMI, no conjunctival injection, or no icterus ENT:  TMs dull gray b/l, OP without erythema LYMPH: no cervical LAD CV: NRRR, normal S1/S2, no murmur, distal pulses 2+ b/l Resp: CTABL, no wheezes, normal WOB Ext: No edema, warm Neuro: Alert and oriented, strength equal b/l UE and LE, coordination grossly normal  Assessment & Plan:  Cathan was seen today for nasal congestion.  Diagnoses and all orders for this visit:  Sinusitis, unspecified chronicity, unspecified location  Symptomatic care and return precautions discussed -     amoxicillin-clavulanate (AUGMENTIN) 875-125 MG tablet; Take 1 tablet by mouth 2 (two) times daily for 7 days.   Follow up plan: No follow-ups on file. Assunta Found, MD [place]

## 2017-12-04 NOTE — Patient Instructions (Signed)
Fever reducer and headache: tylenol nd ibuprofen, can take together or alternating   Sinus pressure:  Nasal steroid such as flonase/fluticaone or nasocort daily Can also take daily antihistamine such as loratadine/claritin or cetirizine/zyrtec  Sinus rinses/irritation: Netipot or similar with distilled water 2-3 times a day to clear out sinuses or Normal saline nasal spray  Sore throat:  Throat lozenges chloroseptic spray  Stick with bland foods Drink lots of fluids

## 2017-12-12 ENCOUNTER — Ambulatory Visit: Payer: Medicare Other | Admitting: Family Medicine

## 2017-12-19 ENCOUNTER — Encounter: Payer: Self-pay | Admitting: Family Medicine

## 2017-12-19 ENCOUNTER — Ambulatory Visit (INDEPENDENT_AMBULATORY_CARE_PROVIDER_SITE_OTHER): Payer: Medicare Other | Admitting: Family Medicine

## 2017-12-19 VITALS — BP 131/67 | HR 76 | Temp 98.8°F | Ht 66.5 in | Wt 337.2 lb

## 2017-12-19 DIAGNOSIS — R918 Other nonspecific abnormal finding of lung field: Secondary | ICD-10-CM

## 2017-12-19 DIAGNOSIS — I10 Essential (primary) hypertension: Secondary | ICD-10-CM | POA: Diagnosis not present

## 2017-12-19 DIAGNOSIS — E119 Type 2 diabetes mellitus without complications: Secondary | ICD-10-CM | POA: Diagnosis not present

## 2017-12-19 DIAGNOSIS — I712 Thoracic aortic aneurysm, without rupture: Secondary | ICD-10-CM

## 2017-12-19 DIAGNOSIS — I7121 Aneurysm of the ascending aorta, without rupture: Secondary | ICD-10-CM

## 2017-12-19 LAB — BAYER DCA HB A1C WAIVED: HB A1C (BAYER DCA - WAIVED): 6.1 % (ref <7.0)

## 2017-12-19 NOTE — Progress Notes (Signed)
   HPI  Patient presents today follow-up for chronic medical conditions.  Type 2 diabetes Patient in the prediabetic range, watching his diet. Believes he is more likely only prediabetic.  Patient has history of AAA seen on CAT scan last year, needs follow-up imaging. Also had lung nodule seen at that time, needs follow-up imaging.  Hypertension Good medication compliance No headache or chest pain.  PMH: Smoking status noted ROS: Per HPI  Objective: BP 131/67   Pulse 76   Temp 98.8 F (37.1 C) (Oral)   Ht 5' 6.5" (1.689 m)   Wt (!) 337 lb 3.2 oz (153 kg)   BMI 53.61 kg/m  Gen: NAD, alert, cooperative with exam HEENT: NCAT CV: RRR, good S1/S2, no murmur Resp: CTABL, no wheezes, non-labored Ext: No edema, warm Neuro: Alert and oriented, No gross deficits  Assessment and plan:  #Type 2 diabetes Prediabetic range A1c pending. No meds for now A1c every 6 months  #Hypertension Well-controlled, no changes Continue Prinzide Labs, fasting  #AAA Discussed, discussed risk CT angio per recommendation of radiology  #Lung nodules Multiple, less than 1 cm Follow-up CT    Orders Placed This Encounter  Procedures  . CT Chest Wo Contrast    Standing Status:   Future    Standing Expiration Date:   02/19/2019    Order Specific Question:   Preferred imaging location?    Answer:   [facility]    Order Specific Question:   Radiology Contrast Protocol - do NOT remove file path    Answer:   \\[email or web address]  . CT ANGIO ABDOMEN W &/OR WO CONTRAST    Standing Status:   Future    Standing Expiration Date:   03/22/2019    Order Specific Question:   If indicated for the ordered procedure, I authorize the administration of contrast media per Radiology protocol    Answer:   Yes    Order Specific Question:   Preferred imaging location?    Answer:   [facility]    Order Specific Question:   Radiology Contrast Protocol - do NOT remove  file path    Answer:   \\[email or web address]  . Bayer DCA Hb A1c Waived  . CMP14+EGFR  . CBC with Differential/Platelet  . Lipid panel    Laroy Apple, MD [facility] 12/19/2017, 9:01 AM

## 2017-12-19 NOTE — Patient Instructions (Signed)
Great to see you!  See Dr. Lajuana Ripple or Dr. Warrick Parisian in 6 months unless you need [place] sooner.

## 2017-12-20 ENCOUNTER — Other Ambulatory Visit: Payer: Self-pay

## 2017-12-20 DIAGNOSIS — E875 Hyperkalemia: Secondary | ICD-10-CM

## 2017-12-20 LAB — CMP14+EGFR
ALT: 14 IU/L (ref 0–44)
AST: 13 IU/L (ref 0–40)
Albumin/Globulin Ratio: 1.5 (ref 1.2–2.2)
Albumin: 4.3 g/dL (ref 3.5–4.8)
Alkaline Phosphatase: 69 IU/L (ref 39–117)
BUN/Creatinine Ratio: 19 (ref 10–24)
BUN: 21 mg/dL (ref 8–27)
Bilirubin Total: 0.2 mg/dL (ref 0.0–1.2)
CO2: 20 mmol/L (ref 20–29)
Calcium: 10.1 mg/dL (ref 8.6–10.2)
Chloride: 104 mmol/L (ref 96–106)
Creatinine, Ser: 1.11 mg/dL (ref 0.76–1.27)
GFR calc Af Amer: 76 mL/min/{1.73_m2} (ref >59)
GFR calc non Af Amer: 66 mL/min/{1.73_m2} (ref >59)
Globulin, Total: 2.8 g/dL (ref 1.5–4.5)
Glucose: 141 mg/dL — ABNORMAL HIGH (ref 65–99)
Potassium: 5.5 mmol/L — ABNORMAL HIGH (ref 3.5–5.2)
Sodium: 142 mmol/L (ref 134–144)
Total Protein: 7.1 g/dL (ref 6.0–8.5)

## 2017-12-20 LAB — CBC WITH DIFFERENTIAL/PLATELET
Basophils Absolute: 0 10*3/uL (ref 0.0–0.2)
Basos: 0 %
EOS (ABSOLUTE): 0.2 10*3/uL (ref 0.0–0.4)
Eos: 2 %
Hematocrit: 43.3 % (ref 37.5–51.0)
Hemoglobin: 14.8 g/dL (ref 13.0–17.7)
Immature Grans (Abs): 0 10*3/uL (ref 0.0–0.1)
Immature Granulocytes: 0 %
Lymphocytes Absolute: 2.3 10*3/uL (ref 0.7–3.1)
Lymphs: 26 %
MCH: 29.5 pg (ref 26.6–33.0)
MCHC: 34.2 g/dL (ref 31.5–35.7)
MCV: 86 fL (ref 79–97)
Monocytes Absolute: 0.7 10*3/uL (ref 0.1–0.9)
Monocytes: 8 %
Neutrophils Absolute: 5.6 10*3/uL (ref 1.4–7.0)
Neutrophils: 64 %
Platelets: 319 10*3/uL (ref 150–450)
RBC: 5.01 x10E6/uL (ref 4.14–5.80)
RDW: 13.4 % (ref 12.3–15.4)
WBC: 8.8 10*3/uL (ref 3.4–10.8)

## 2017-12-20 LAB — LIPID PANEL
Chol/HDL Ratio: 4.9 ratio (ref 0.0–5.0)
Cholesterol, Total: 199 mg/dL (ref 100–199)
HDL: 41 mg/dL (ref >39)
LDL Calculated: 129 mg/dL — ABNORMAL HIGH (ref 0–99)
Triglycerides: 144 mg/dL (ref 0–149)
VLDL Cholesterol Cal: 29 mg/dL (ref 5–40)

## 2018-01-07 ENCOUNTER — Ambulatory Visit (HOSPITAL_COMMUNITY): Payer: PRIVATE HEALTH INSURANCE

## 2018-01-20 ENCOUNTER — Other Ambulatory Visit: Payer: Self-pay | Admitting: Family Medicine

## 2018-01-20 ENCOUNTER — Ambulatory Visit (HOSPITAL_COMMUNITY)
Admission: RE | Admit: 2018-01-20 | Discharge: 2018-01-20 | Disposition: A | Discharge status: Home / Self Care | Payer: Medicare Other | Source: Ambulatory Visit | Attending: Family Medicine | Admitting: Family Medicine

## 2018-01-20 DIAGNOSIS — I2510 Atherosclerotic heart disease of native coronary artery without angina pectoris: Secondary | ICD-10-CM | POA: Diagnosis not present

## 2018-01-20 DIAGNOSIS — I700 Atherosclerosis of aorta: Secondary | ICD-10-CM | POA: Insufficient documentation

## 2018-01-20 DIAGNOSIS — R918 Other nonspecific abnormal finding of lung field: Secondary | ICD-10-CM | POA: Diagnosis present

## 2018-01-20 DIAGNOSIS — I712 Thoracic aortic aneurysm, without rupture: Secondary | ICD-10-CM | POA: Diagnosis not present

## 2018-01-20 DIAGNOSIS — R911 Solitary pulmonary nodule: Secondary | ICD-10-CM | POA: Insufficient documentation

## 2018-01-20 DIAGNOSIS — I7121 Aneurysm of the ascending aorta, without rupture: Secondary | ICD-10-CM

## 2018-01-20 MED ORDER — IOPAMIDOL (ISOVUE-370) INJECTION 76%
100.0000 mL | Freq: Once | INTRAVENOUS | Status: AC | PRN
  Administered 2018-01-20: 100 mL via INTRAVENOUS

## 2018-02-07 ENCOUNTER — Ambulatory Visit (HOSPITAL_COMMUNITY): Admission: RE | Admit: 2018-02-07 | Payer: Medicare Other | Source: Ambulatory Visit

## 2018-02-28 ENCOUNTER — Other Ambulatory Visit: Payer: Self-pay

## 2018-02-28 MED ORDER — LISINOPRIL-HYDROCHLOROTHIAZIDE 10-12.5 MG PO TABS: 1.0000 | ORAL_TABLET | Freq: Every day | ORAL | 0 refills | Status: DC

## 2018-03-06 DIAGNOSIS — M4726 Other spondylosis with radiculopathy, lumbar region: Secondary | ICD-10-CM | POA: Diagnosis not present

## 2018-03-06 DIAGNOSIS — M5136 Other intervertebral disc degeneration, lumbar region: Secondary | ICD-10-CM | POA: Diagnosis not present

## 2018-03-06 DIAGNOSIS — M48061 Spinal stenosis, lumbar region without neurogenic claudication: Secondary | ICD-10-CM | POA: Diagnosis not present

## 2018-04-04 ENCOUNTER — Other Ambulatory Visit: Payer: Medicare Other

## 2018-04-04 DIAGNOSIS — E875 Hyperkalemia: Secondary | ICD-10-CM

## 2018-04-05 LAB — BMP8+EGFR
BUN/Creatinine Ratio: 17 (ref 10–24)
BUN: 16 mg/dL (ref 8–27)
CO2: 23 mmol/L (ref 20–29)
Calcium: 10 mg/dL (ref 8.6–10.2)
Chloride: 99 mmol/L (ref 96–106)
Creatinine, Ser: 0.94 mg/dL (ref 0.76–1.27)
GFR calc Af Amer: 92 mL/min/{1.73_m2} (ref >59)
GFR calc non Af Amer: 80 mL/min/{1.73_m2} (ref >59)
Glucose: 104 mg/dL — ABNORMAL HIGH (ref 65–99)
Potassium: 4.9 mmol/L (ref 3.5–5.2)
Sodium: 138 mmol/L (ref 134–144)

## 2018-06-02 ENCOUNTER — Other Ambulatory Visit: Payer: Self-pay | Admitting: Family

## 2018-07-01 ENCOUNTER — Other Ambulatory Visit: Payer: Self-pay | Admitting: Family

## 2018-07-29 ENCOUNTER — Ambulatory Visit: Payer: Medicare Other | Admitting: Family Medicine

## 2018-08-03 ENCOUNTER — Other Ambulatory Visit: Payer: Self-pay | Admitting: Family Medicine

## 2018-08-14 ENCOUNTER — Ambulatory Visit (INDEPENDENT_AMBULATORY_CARE_PROVIDER_SITE_OTHER): Payer: Medicare Other | Admitting: Family Medicine

## 2018-08-14 ENCOUNTER — Encounter: Payer: Self-pay | Admitting: Family Medicine

## 2018-08-14 VITALS — BP 137/66 | HR 73 | Temp 97.3°F | Ht 66.5 in | Wt 301.6 lb

## 2018-08-14 DIAGNOSIS — B070 Plantar wart: Secondary | ICD-10-CM

## 2018-08-14 DIAGNOSIS — R0981 Nasal congestion: Secondary | ICD-10-CM

## 2018-08-14 MED ORDER — AZITHROMYCIN 250 MG PO TABS: ORAL_TABLET | ORAL | 0 refills | Status: DC

## 2018-08-14 NOTE — Progress Notes (Signed)
BP 137/66   Pulse 73   Temp (!) 97.3 F (36.3 C) (Oral)   Ht 5' 6.5" (1.689 m)   Wt (!) 301 lb 9.6 oz (136.8 kg)   BMI 47.95 kg/m    Subjective:    Patient ID: Ryan Garner, male    DOB: June 28, 1946, 73 y.o.   MRN: [id number]  HPI: Ryan Garner is a 73 y.o. male presenting on 08/14/2018 for Nasal Congestion (x 1-2 weeks); facial pressure; Cough; and Foot Pain (right bottom of foot patient states there is painful spot that has been there x 3 months)   HPI Sinus congestion and facial pressure and headache Patient complains of sinus congestion facial pressure and headache that is been going on for 1 to 2 weeks.  He says he is got a lot of postnasal drainage and cough.  His cough is been mostly nonproductive.  Patient has been using his Flonase and some over-the-counter sinus medication which have been helping some but it does not seem to be getting better and is still having issues.  Right foot pain Patient comes in complaining of pain on the bottom of his right foot from 1 specific spot where he feels like either stepped on something or something is growing on the bottom of his foot.  It does not hurt when he is not stepping on it but as soon as he steps on that spot it starts to cause him pain.  He denies any fevers or chills or redness or warmth or sores but just has a small spot where he thinks he may have stepped on something and got in a sliver in his foot.  Feels like it is been there for about 3 months and is just not going away.  Relevant past medical, surgical, family and social history reviewed and updated as indicated. Interim medical history since our last visit reviewed. Allergies and medications reviewed and updated.  Review of Systems  Constitutional: Negative for chills and fever.  HENT: Positive for congestion, postnasal drip and sinus pressure. Negative for ear discharge, ear pain, rhinorrhea, sneezing, sore throat and voice change.   Eyes: Negative for pain, discharge,  redness and visual disturbance.  Respiratory: Positive for cough. Negative for shortness of breath and wheezing.   Cardiovascular: Negative for chest pain and leg swelling.  Musculoskeletal: Negative for back pain and gait problem.  Skin: Negative for rash.  All other systems reviewed and are negative.   Per HPI unless specifically indicated above   Allergies as of 08/14/2018      Reactions   Rofecoxib Other (See Comments)      Medication List       Accurate as of August 14, 2018 11:59 PM. Always use your most recent med list.        azithromycin 250 MG tablet Commonly known as:  ZITHROMAX Take 2 the first day and then one each day after.   fluticasone 50 MCG/ACT nasal spray Commonly known as:  FLONASE Place 2 sprays into both nostrils daily.   JOINT HEALTH Caps Take 1 capsule by mouth daily.   lisinopril-hydrochlorothiazide 10-12.5 MG tablet Commonly known as:  PRINZIDE,ZESTORETIC Take 1 tablet by mouth daily.   multivitamin with minerals Tabs tablet Take 1 tablet by mouth daily.   traMADol 50 MG tablet Commonly known as:  ULTRAM Take 50 mg by mouth every 6 (six) hours as needed.          Objective:    BP 137/66   Pulse  73   Temp (!) 97.3 F (36.3 C) (Oral)   Ht 5' 6.5" (1.689 m)   Wt (!) 301 lb 9.6 oz (136.8 kg)   BMI 47.95 kg/m   Wt Readings from Last 3 Encounters:  08/14/18 (!) 301 lb 9.6 oz (136.8 kg)  12/19/17 (!) 337 lb 3.2 oz (153 kg)  12/04/17 (!) 341 lb 9.6 oz (154.9 kg)    Physical Exam Vitals signs and nursing note reviewed.  Constitutional:      General: He is not in acute distress.    Appearance: He is well-developed. He is not diaphoretic.  HENT:     Right Ear: Tympanic membrane, ear canal and external ear normal.     Left Ear: Tympanic membrane, ear canal and external ear normal.     Nose: Mucosal edema and rhinorrhea present.     Right Sinus: Maxillary sinus tenderness present. No frontal sinus tenderness.     Left Sinus:  Maxillary sinus tenderness present. No frontal sinus tenderness.     Mouth/Throat:     Pharynx: Uvula midline. No oropharyngeal exudate or posterior oropharyngeal erythema.     Tonsils: No tonsillar abscesses.  Eyes:     General: No scleral icterus.       Right eye: No discharge.     Conjunctiva/sclera: Conjunctivae normal.     Pupils: Pupils are equal, round, and reactive to light.  Neck:     Musculoskeletal: Neck supple.     Thyroid: No thyromegaly.  Cardiovascular:     Rate and Rhythm: Normal rate and regular rhythm.     Heart sounds: Normal heart sounds. No murmur.  Pulmonary:     Effort: Pulmonary effort is normal. No respiratory distress.     Breath sounds: Normal breath sounds. No wheezing or rales.  Musculoskeletal: Normal range of motion.  Lymphadenopathy:     Cervical: No cervical adenopathy.  Skin:    General: Skin is warm and dry.     Findings: Lesion (Very small plantar wart that is pinpoint size on the bottom of his right foot) present. No rash.  Neurological:     Mental Status: He is alert and oriented to person, place, and time.     Coordination: Coordination normal.  Psychiatric:        Behavior: Behavior normal.     Cryotherapy wart: Did to 10-second bursts of liquid nitrogen along with using a 15 blade to excise the core of the wart, patient tolerated well with no bleeding     Assessment & Plan:   Problem List Items Addressed This Visit    None    Visit Diagnoses    Sinus congestion    -  Primary   Relevant Medications   azithromycin (ZITHROMAX) 250 MG tablet   Plantar wart       Relevant Medications   azithromycin (ZITHROMAX) 250 MG tablet       Follow up plan: Return if symptoms worsen or fail to improve.  Counseling provided for all of the vaccine components No orders of the defined types were placed in this encounter.   Caryl Pina, MD [facility] 08/19/2018, 9:20 PM

## 2018-08-26 ENCOUNTER — Encounter: Payer: Self-pay | Admitting: Family Medicine

## 2018-08-26 ENCOUNTER — Ambulatory Visit (INDEPENDENT_AMBULATORY_CARE_PROVIDER_SITE_OTHER): Payer: Medicare Other | Admitting: Family Medicine

## 2018-08-26 VITALS — BP 129/73 | HR 74 | Temp 97.2°F | Ht 66.5 in | Wt 304.8 lb

## 2018-08-26 DIAGNOSIS — J0110 Acute frontal sinusitis, unspecified: Secondary | ICD-10-CM

## 2018-08-26 MED ORDER — AMOXICILLIN-POT CLAVULANATE 875-125 MG PO TABS
1.0000 | ORAL_TABLET | Freq: Two times a day (BID) | ORAL | 0 refills | Status: DC
Start: 2018-08-26 — End: 2018-09-04

## 2018-08-26 NOTE — Progress Notes (Signed)
BP 129/73   Pulse 74   Temp (!) 97.2 F (36.2 C) (Oral)   Ht 5' 6.5" (1.689 m)   Wt (!) 304 lb 12.8 oz (138.3 kg)   SpO2 93%   BMI 48.46 kg/m    Subjective:    Patient ID: Ryan Garner, male    DOB: 20-Aug-1945, 73 y.o.   MRN: [id number]  HPI: Ryan Garner is a 73 y.o. male presenting on 08/26/2018 for Cough (x 4 weeks. Patient got azithrimycin but it didn't go away.); Nasal Congestion; and sneezing   HPI Sinus pressure and congestion Patient comes in today for another visit because of sinus pressure and congestion.  He was seen 3 weeks ago and has been having this going on for almost 4 weeks.  He was given azithromycin and has been using Flonase to no avail.  He feels like they did not help him at all at this point.  He denies any fevers or chills or shortness of breath or wheezing or chest congestion.  He says he has pressure above his eyes and below his eyes and behind his eyes.  He does use his CPAP at night and does feel like it gets worse overnight.  He does have a moisturizer for his CPAP and regularly cleans out his CPAP every day.  He does also complain of some sneezing along with the congestion and sinus pressure.  He denies any fevers or chills.  Relevant past medical, surgical, family and social history reviewed and updated as indicated. Interim medical history since our last visit reviewed. Allergies and medications reviewed and updated.  Review of Systems  Constitutional: Negative for chills and fever.  HENT: Positive for congestion, postnasal drip, sinus pressure, sneezing and sore throat. Negative for ear discharge, ear pain, rhinorrhea and voice change.   Eyes: Negative for pain, discharge, redness and visual disturbance.  Respiratory: Positive for cough. Negative for shortness of breath and wheezing.   Cardiovascular: Negative for chest pain and leg swelling.  Musculoskeletal: Negative for gait problem.  Skin: Negative for rash.  All other systems reviewed and are  negative.   Per HPI unless specifically indicated above   Allergies as of 08/26/2018      Reactions   Rofecoxib Other (See Comments)      Medication List       Accurate as of August 26, 2018 10:07 AM. Always use your most recent med list.        amoxicillin-clavulanate 875-125 MG tablet Commonly known as:  AUGMENTIN Take 1 tablet by mouth 2 (two) times daily.   fluticasone 50 MCG/ACT nasal spray Commonly known as:  FLONASE Place 2 sprays into both nostrils daily.   JOINT HEALTH Caps Take 1 capsule by mouth daily.   lisinopril-hydrochlorothiazide 10-12.5 MG tablet Commonly known as:  PRINZIDE,ZESTORETIC Take 1 tablet by mouth daily.   multivitamin with minerals Tabs tablet Take 1 tablet by mouth daily.   traMADol 50 MG tablet Commonly known as:  ULTRAM Take 50 mg by mouth every 6 (six) hours as needed.          Objective:    BP 129/73   Pulse 74   Temp (!) 97.2 F (36.2 C) (Oral)   Ht 5' 6.5" (1.689 m)   Wt (!) 304 lb 12.8 oz (138.3 kg)   SpO2 93%   BMI 48.46 kg/m   Wt Readings from Last 3 Encounters:  08/26/18 (!) 304 lb 12.8 oz (138.3 kg)  08/14/18 (!) 301 lb 9.6  oz (136.8 kg)  12/19/17 (!) 337 lb 3.2 oz (153 kg)    Physical Exam Vitals signs and nursing note reviewed.  Constitutional:      General: He is not in acute distress.    Appearance: He is well-developed. He is not diaphoretic.  HENT:     Right Ear: Tympanic membrane, ear canal and external ear normal.     Left Ear: Tympanic membrane, ear canal and external ear normal.     Nose: Mucosal edema present. No rhinorrhea.     Right Sinus: Maxillary sinus tenderness and frontal sinus tenderness present.     Left Sinus: Maxillary sinus tenderness and frontal sinus tenderness present.     Mouth/Throat:     Pharynx: Uvula midline. No oropharyngeal exudate or posterior oropharyngeal erythema.     Tonsils: No tonsillar abscesses.  Eyes:     General: No scleral icterus.       Right eye: No  discharge.     Conjunctiva/sclera: Conjunctivae normal.     Pupils: Pupils are equal, round, and reactive to light.  Neck:     Musculoskeletal: Neck supple.     Thyroid: No thyromegaly.  Cardiovascular:     Rate and Rhythm: Normal rate and regular rhythm.     Heart sounds: Normal heart sounds. No murmur.  Pulmonary:     Effort: Pulmonary effort is normal. No respiratory distress.     Breath sounds: Normal breath sounds. No wheezing or rales.  Musculoskeletal: Normal range of motion.  Lymphadenopathy:     Cervical: No cervical adenopathy.  Skin:    General: Skin is warm and dry.     Findings: No rash.  Neurological:     Mental Status: He is alert and oriented to person, place, and time.     Coordination: Coordination normal.  Psychiatric:        Behavior: Behavior normal.         Assessment & Plan:   Problem List Items Addressed This Visit    None    Visit Diagnoses    Subacute frontal sinusitis    -  Primary   Relevant Medications   amoxicillin-clavulanate (AUGMENTIN) 875-125 MG tablet       Follow up plan: Return if symptoms worsen or fail to improve.  Counseling provided for all of the vaccine components No orders of the defined types were placed in this encounter.   Caryl Pina, MD [facility] 08/26/2018, 10:07 AM

## 2018-09-02 ENCOUNTER — Other Ambulatory Visit: Payer: Self-pay | Admitting: Family Medicine

## 2018-09-02 NOTE — Telephone Encounter (Signed)
Apt made

## 2018-09-02 NOTE — Telephone Encounter (Signed)
[name]. NTBS 30 days given 08/05/18

## 2018-09-04 ENCOUNTER — Encounter: Payer: Self-pay | Admitting: Family Medicine

## 2018-09-04 ENCOUNTER — Ambulatory Visit (INDEPENDENT_AMBULATORY_CARE_PROVIDER_SITE_OTHER): Payer: Medicare Other | Admitting: Family Medicine

## 2018-09-04 VITALS — BP 127/67 | HR 74 | Temp 97.4°F | Ht 66.5 in | Wt 302.4 lb

## 2018-09-04 DIAGNOSIS — E1169 Type 2 diabetes mellitus with other specified complication: Secondary | ICD-10-CM

## 2018-09-04 DIAGNOSIS — E782 Mixed hyperlipidemia: Secondary | ICD-10-CM

## 2018-09-04 DIAGNOSIS — I10 Essential (primary) hypertension: Secondary | ICD-10-CM | POA: Diagnosis not present

## 2018-09-04 LAB — BAYER DCA HB A1C WAIVED: HB A1C (BAYER DCA - WAIVED): 5.8 % (ref <7.0)

## 2018-09-04 MED ORDER — LISINOPRIL-HYDROCHLOROTHIAZIDE 10-12.5 MG PO TABS: 1.0000 | ORAL_TABLET | Freq: Every day | ORAL | 3 refills | Status: DC

## 2018-09-04 NOTE — Progress Notes (Signed)
BP 127/67   Pulse 74   Temp (!) 97.4 F (36.3 C) (Oral)   Ht 5' 6.5" (1.689 m)   Wt (!) 302 lb 6.4 oz (137.2 kg)   BMI 48.08 kg/m    Subjective:    Patient ID: Ryan Garner, male    DOB: 10/06/1945, 73 y.o.   MRN: [id number]  HPI: Ryan Garner is a 73 y.o. male presenting on 09/04/2018 for Hypertension (check up of chronic medical conditions)   HPI Hypertension Patient is currently on lisinopril hydrochlorothiazide, and their blood pressure today is 127/67. Patient denies any lightheadedness or dizziness. Patient denies headaches, blurred vision, chest pains, shortness of breath, or weakness. Denies any side effects from medication and is content with current medication.   Type 2 diabetes mellitus Patient comes in today for recheck of his diabetes. Patient has been currently taking no medication and has been diet controlled and doing well with it, we will check an A1c today. Patient is currently on an ACE inhibitor/ARB. Patient has not seen an ophthalmologist this year. Patient denies any issues with their feet.   Hyperlipidemia Patient is coming in for recheck of his hyperlipidemia. The patient is currently taking no medication and we have just been monitoring for now. They deny any issues with myalgias or history of liver damage from it. They deny any focal numbness or weakness or chest pain.   Patient is on a current dietary plan and changing doing Optiva and has lost 60 pounds over the past 5 months and is continuing with that weight loss plan  Relevant past medical, surgical, family and social history reviewed and updated as indicated. Interim medical history since our last visit reviewed. Allergies and medications reviewed and updated.  Review of Systems  Constitutional: Negative for chills and fever.  Eyes: Negative for pain and discharge.  Respiratory: Negative for cough, shortness of breath and wheezing.   Cardiovascular: Negative for chest pain, palpitations and leg  swelling.  Skin: Negative for rash.  Neurological: Negative for dizziness, weakness and headaches.  All other systems reviewed and are negative.   Per HPI unless specifically indicated above   Allergies as of 09/04/2018      Reactions   Rofecoxib Other (See Comments)      Medication List       Accurate as of September 04, 2018 11:58 AM. Always use your most recent med list.        fluticasone 50 MCG/ACT nasal spray Commonly known as:  FLONASE Place 2 sprays into both nostrils daily.   JOINT HEALTH Caps Take 1 capsule by mouth daily.   lisinopril-hydrochlorothiazide 10-12.5 MG tablet Commonly known as:  PRINZIDE,ZESTORETIC Take 1 tablet by mouth daily.   multivitamin with minerals Tabs tablet Take 1 tablet by mouth daily.   traMADol 50 MG tablet Commonly known as:  ULTRAM Take 50 mg by mouth every 6 (six) hours as needed.          Objective:    BP 127/67   Pulse 74   Temp (!) 97.4 F (36.3 C) (Oral)   Ht 5' 6.5" (1.689 m)   Wt (!) 302 lb 6.4 oz (137.2 kg)   BMI 48.08 kg/m   Wt Readings from Last 3 Encounters:  09/04/18 (!) 302 lb 6.4 oz (137.2 kg)  08/26/18 (!) 304 lb 12.8 oz (138.3 kg)  08/14/18 (!) 301 lb 9.6 oz (136.8 kg)    Physical Exam Vitals signs and nursing note reviewed.  Constitutional:  General: He is not in acute distress.    Appearance: He is well-developed. He is not diaphoretic.  Eyes:     General: No scleral icterus.    Conjunctiva/sclera: Conjunctivae normal.  Neck:     Musculoskeletal: Neck supple.     Thyroid: No thyromegaly.  Cardiovascular:     Rate and Rhythm: Normal rate and regular rhythm.     Heart sounds: Normal heart sounds. No murmur.  Pulmonary:     Effort: Pulmonary effort is normal. No respiratory distress.     Breath sounds: Normal breath sounds. No wheezing.  Musculoskeletal: Normal range of motion.        General: Swelling (Has compression stockings on and 1+ pitting bilaterally with stockings on) present.   Lymphadenopathy:     Cervical: No cervical adenopathy.  Skin:    General: Skin is warm and dry.     Findings: No rash.  Neurological:     Mental Status: He is alert and oriented to person, place, and time.     Coordination: Coordination normal.  Psychiatric:        Behavior: Behavior normal.         Assessment & Plan:   Problem List Items Addressed This Visit      Cardiovascular and Mediastinum   Hypertension   Relevant Medications   lisinopril-hydrochlorothiazide (PRINZIDE,ZESTORETIC) 10-12.5 MG tablet   Other Relevant Orders   Bayer DCA Hb A1c Waived   Lipid panel     Endocrine   Type 2 diabetes mellitus (HCC) - Primary   Relevant Medications   lisinopril-hydrochlorothiazide (PRINZIDE,ZESTORETIC) 10-12.5 MG tablet   Other Relevant Orders   CMP14+EGFR   Bayer DCA Hb A1c Waived   Lipid panel     Other   Hyperlipidemia   Relevant Medications   lisinopril-hydrochlorothiazide (PRINZIDE,ZESTORETIC) 10-12.5 MG tablet   Other Relevant Orders   Lipid panel   Morbid obesity (HCC)      Continue lisinopril hydrochlorothiazide and will check labs including A1c and can panel. Follow up plan: Return in about 6 months (around 03/05/2019), or if symptoms worsen or fail to improve, for Hypertension and diabetes recheck.  Counseling provided for all of the vaccine components Orders Placed This Encounter  Procedures  . CMP14+EGFR  . Bayer DCA Hb A1c Waived  . Lipid panel    Caryl Pina, MD [facility] 09/04/2018, 11:58 AM

## 2018-09-05 LAB — CMP14+EGFR
ALT: 12 IU/L (ref 0–44)
AST: 17 IU/L (ref 0–40)
Albumin/Globulin Ratio: 1.7 (ref 1.2–2.2)
Albumin: 4.2 g/dL (ref 3.7–4.7)
Alkaline Phosphatase: 70 IU/L (ref 39–117)
BUN/Creatinine Ratio: 23 (ref 10–24)
BUN: 21 mg/dL (ref 8–27)
Bilirubin Total: 0.3 mg/dL (ref 0.0–1.2)
CO2: 20 mmol/L (ref 20–29)
Calcium: 9.8 mg/dL (ref 8.6–10.2)
Chloride: 99 mmol/L (ref 96–106)
Creatinine, Ser: 0.93 mg/dL (ref 0.76–1.27)
GFR calc Af Amer: 93 mL/min/{1.73_m2} (ref >59)
GFR calc non Af Amer: 81 mL/min/{1.73_m2} (ref >59)
Globulin, Total: 2.5 g/dL (ref 1.5–4.5)
Glucose: 114 mg/dL — ABNORMAL HIGH (ref 65–99)
Potassium: 4.6 mmol/L (ref 3.5–5.2)
Sodium: 136 mmol/L (ref 134–144)
Total Protein: 6.7 g/dL (ref 6.0–8.5)

## 2018-09-05 LAB — LIPID PANEL
Chol/HDL Ratio: 4 ratio (ref 0.0–5.0)
Cholesterol, Total: 166 mg/dL (ref 100–199)
HDL: 42 mg/dL (ref >39)
LDL Calculated: 108 mg/dL — ABNORMAL HIGH (ref 0–99)
Triglycerides: 82 mg/dL (ref 0–149)
VLDL Cholesterol Cal: 16 mg/dL (ref 5–40)

## 2018-09-08 ENCOUNTER — Other Ambulatory Visit: Payer: Self-pay | Admitting: Family Medicine

## 2018-09-08 DIAGNOSIS — J0110 Acute frontal sinusitis, unspecified: Secondary | ICD-10-CM

## 2018-10-27 ENCOUNTER — Encounter: Payer: Medicare Other | Admitting: *Deleted

## 2018-11-19 ENCOUNTER — Encounter: Payer: Medicare Other | Admitting: *Deleted

## 2018-11-19 ENCOUNTER — Encounter: Payer: Self-pay | Admitting: Family Medicine

## 2018-11-19 ENCOUNTER — Ambulatory Visit (INDEPENDENT_AMBULATORY_CARE_PROVIDER_SITE_OTHER): Payer: Medicare Other | Admitting: Family Medicine

## 2018-11-19 ENCOUNTER — Other Ambulatory Visit: Payer: Self-pay

## 2018-11-19 VITALS — BP 133/86 | HR 77 | Temp 97.4°F | Ht 66.5 in | Wt 298.4 lb

## 2018-11-19 DIAGNOSIS — S93492A Sprain of other ligament of left ankle, initial encounter: Secondary | ICD-10-CM | POA: Diagnosis not present

## 2018-11-19 DIAGNOSIS — S83422A Sprain of lateral collateral ligament of left knee, initial encounter: Secondary | ICD-10-CM

## 2018-11-19 MED ORDER — NAPROXEN 500 MG PO TABS: 500.0000 mg | ORAL_TABLET | Freq: Two times a day (BID) | ORAL | 0 refills | Status: DC

## 2018-11-19 NOTE — Progress Notes (Signed)
BP 133/86   Pulse 77   Temp (!) 97.4 F (36.3 C) (Oral)   Ht 5' 6.5" (1.689 m)   Wt 298 lb 6.4 oz (135.4 kg)   BMI 47.44 kg/m    Subjective:   Patient ID: Ryan Garner, male    DOB: 08-31-1945, 73 y.o.   MRN: [id number]  HPI: Ryan Garner is a 73 y.o. male presenting on 11/19/2018 for left ankle pain (x 3 months) and Knee Pain (left x 3 days)   HPI Left ankle and knee pain Patient is coming in complaining of left ankle and right knee pain.  He says the left ankle pain is been going on for about 3 months when he slipped on some snow and tweaked his ankle underneath his other leg and bent it forward.  He says is been sore since then and he has been trying to elevate it and wearing compression stockings to help with that but it does not seem to be fully resolved.  He says he has no weakness or giving away but just that it is been hurting since then.  He also thinks he tweaked his knee on the left side initially and it was sore and then it got better but now over the past 3 days is started to bother him again.  He thinks the knee pain is very similar to when he has a spinal issue because he gets spinal nerve issues which cause pain very similar to this.  He is going to call a spinal surgeon to get an injection for that.  Relevant past medical, surgical, family and social history reviewed and updated as indicated. Interim medical history since our last visit reviewed. Allergies and medications reviewed and updated.  Review of Systems  Constitutional: Negative for chills and fever.  Respiratory: Negative for shortness of breath and wheezing.   Cardiovascular: Positive for leg swelling. Negative for chest pain.  Musculoskeletal: Positive for arthralgias and joint swelling. Negative for back pain and gait problem.  Skin: Negative for rash.  Neurological: Negative for weakness and numbness.  All other systems reviewed and are negative.   Per HPI unless specifically indicated above    Allergies as of 11/19/2018      Reactions   Rofecoxib Other (See Comments)      Medication List       Accurate as of November 19, 2018  3:27 PM. Always use your most recent med list.        fluticasone 50 MCG/ACT nasal spray Commonly known as:  FLONASE Place 2 sprays into both nostrils daily.   Joint Health Caps Take 1 capsule by mouth daily.   lisinopril-hydrochlorothiazide 10-12.5 MG tablet Commonly known as:  ZESTORETIC Take 1 tablet by mouth daily.   multivitamin with minerals Tabs tablet Take 1 tablet by mouth daily.   naproxen 500 MG tablet Commonly known as:  Naprosyn Take 1 tablet (500 mg total) by mouth 2 (two) times daily with a meal.   traMADol 50 MG tablet Commonly known as:  ULTRAM Take 50 mg by mouth every 6 (six) hours as needed.        Objective:   BP 133/86   Pulse 77   Temp (!) 97.4 F (36.3 C) (Oral)   Ht 5' 6.5" (1.689 m)   Wt 298 lb 6.4 oz (135.4 kg)   BMI 47.44 kg/m   Wt Readings from Last 3 Encounters:  11/19/18 298 lb 6.4 oz (135.4 kg)  09/04/18 (!) 302 lb 6.4  oz (137.2 kg)  08/26/18 (!) 304 lb 12.8 oz (138.3 kg)    Physical Exam Vitals signs and nursing note reviewed.  Constitutional:      General: He is not in acute distress.    Appearance: He is well-developed. He is not diaphoretic.  Eyes:     General: No scleral icterus.    Conjunctiva/sclera: Conjunctivae normal.  Neck:     Musculoskeletal: Neck supple.     Thyroid: No thyromegaly.  Cardiovascular:     Rate and Rhythm: Normal rate and regular rhythm.     Heart sounds: Normal heart sounds. No murmur.  Pulmonary:     Effort: Pulmonary effort is normal. No respiratory distress.     Breath sounds: Normal breath sounds. No wheezing.  Musculoskeletal: Normal range of motion.        General: Swelling (2+ lower extremity bilaterally) present.     Left knee: He exhibits normal range of motion, no swelling and no effusion. Tenderness found. Lateral joint line tenderness noted.      Left ankle: He exhibits swelling. He exhibits normal range of motion, no ecchymosis and no deformity. Tenderness. AITFL tenderness found.  Lymphadenopathy:     Cervical: No cervical adenopathy.  Skin:    General: Skin is warm and dry.     Findings: No rash.  Neurological:     Mental Status: He is alert and oriented to person, place, and time.     Coordination: Coordination normal.  Psychiatric:        Behavior: Behavior normal.       Assessment & Plan:   Problem List Items Addressed This Visit    None    Visit Diagnoses    Sprain of anterior talofibular ligament of left ankle, initial encounter    -  Primary   Relevant Medications   naproxen (NAPROSYN) 500 MG tablet   Sprain of lateral collateral ligament of left knee, initial encounter       Relevant Medications   naproxen (NAPROSYN) 500 MG tablet      Gave a printout of workouts that he can do for ankle sprain and he will go see his spinal doctor for his knee because he says that is very similar to when he gets a nerve pain from his back and needs an injection. Follow up plan: Return if symptoms worsen or fail to improve.  Counseling provided for all of the vaccine components No orders of the defined types were placed in this encounter.   Caryl Pina, MD [facility] 11/19/2018, 3:27 PM

## 2018-11-25 ENCOUNTER — Other Ambulatory Visit: Payer: Self-pay

## 2018-12-04 ENCOUNTER — Other Ambulatory Visit: Payer: Self-pay

## 2018-12-04 ENCOUNTER — Ambulatory Visit: Payer: Medicare Other | Admitting: Family Medicine

## 2019-01-08 ENCOUNTER — Other Ambulatory Visit: Payer: Self-pay

## 2019-01-08 DIAGNOSIS — M48061 Spinal stenosis, lumbar region without neurogenic claudication: Secondary | ICD-10-CM | POA: Diagnosis not present

## 2019-01-08 DIAGNOSIS — M4726 Other spondylosis with radiculopathy, lumbar region: Secondary | ICD-10-CM | POA: Diagnosis not present

## 2019-01-08 DIAGNOSIS — M5136 Other intervertebral disc degeneration, lumbar region: Secondary | ICD-10-CM | POA: Diagnosis not present

## 2019-02-25 ENCOUNTER — Telehealth: Payer: Self-pay | Admitting: Family Medicine

## 2019-02-25 NOTE — Telephone Encounter (Signed)
LMTCB

## 2019-02-26 ENCOUNTER — Telehealth: Payer: Self-pay | Admitting: Family Medicine

## 2019-02-26 NOTE — Telephone Encounter (Signed)
Patient complains of foot pain and ankle pain.  Patietn wants to wait to see dettinger.  appt made

## 2019-03-03 NOTE — Telephone Encounter (Signed)
Phone call taken care of in different encounter.  This encounter will now be  Closed

## 2019-03-11 DIAGNOSIS — L819 Disorder of pigmentation, unspecified: Secondary | ICD-10-CM | POA: Diagnosis not present

## 2019-03-11 DIAGNOSIS — L570 Actinic keratosis: Secondary | ICD-10-CM | POA: Diagnosis not present

## 2019-03-19 ENCOUNTER — Ambulatory Visit: Payer: Medicare Other | Admitting: Family Medicine

## 2019-03-24 DIAGNOSIS — M48062 Spinal stenosis, lumbar region with neurogenic claudication: Secondary | ICD-10-CM | POA: Diagnosis not present

## 2019-03-24 DIAGNOSIS — M4726 Other spondylosis with radiculopathy, lumbar region: Secondary | ICD-10-CM | POA: Diagnosis not present

## 2019-03-24 DIAGNOSIS — M5416 Radiculopathy, lumbar region: Secondary | ICD-10-CM | POA: Diagnosis not present

## 2019-03-24 DIAGNOSIS — M5136 Other intervertebral disc degeneration, lumbar region: Secondary | ICD-10-CM | POA: Diagnosis not present

## 2019-03-24 DIAGNOSIS — M4316 Spondylolisthesis, lumbar region: Secondary | ICD-10-CM | POA: Diagnosis not present

## 2019-03-31 ENCOUNTER — Other Ambulatory Visit: Payer: Self-pay

## 2019-04-01 ENCOUNTER — Encounter: Payer: Self-pay | Admitting: Family Medicine

## 2019-04-01 ENCOUNTER — Ambulatory Visit (INDEPENDENT_AMBULATORY_CARE_PROVIDER_SITE_OTHER): Payer: Medicare Other | Admitting: Family Medicine

## 2019-04-01 VITALS — BP 115/66 | HR 69 | Temp 98.9°F | Resp 16 | Ht 66.5 in | Wt 295.2 lb

## 2019-04-01 DIAGNOSIS — S83422A Sprain of lateral collateral ligament of left knee, initial encounter: Secondary | ICD-10-CM | POA: Diagnosis not present

## 2019-04-01 DIAGNOSIS — L819 Disorder of pigmentation, unspecified: Secondary | ICD-10-CM | POA: Diagnosis not present

## 2019-04-01 DIAGNOSIS — S93492A Sprain of other ligament of left ankle, initial encounter: Secondary | ICD-10-CM | POA: Diagnosis not present

## 2019-04-01 DIAGNOSIS — L570 Actinic keratosis: Secondary | ICD-10-CM | POA: Diagnosis not present

## 2019-04-01 DIAGNOSIS — B070 Plantar wart: Secondary | ICD-10-CM | POA: Diagnosis not present

## 2019-04-01 NOTE — Progress Notes (Signed)
BP 115/66   Pulse 69   Temp 98.9 F (37.2 C) (Temporal)   Resp 16   Ht 5' 6.5" (1.689 m)   Wt 295 lb 3.2 oz (133.9 kg)   SpO2 96%   BMI 46.93 kg/m    Subjective:   Patient ID: Ryan Garner, male    DOB: [birth year], 73 y.o.   MRN: [id number]  HPI: Ryan Garner is a 73 y.o. male presenting on 04/01/2019 for Ankle Pain (left- Patient states it has been on and off since jan)   HPI Continued left ankle pain Patient is coming in complaining of continued left ankle pain.  He says this is been bothering him continued over the past 6 months, he has been doing the naproxen and physical therapy at home with his home exercises, he did not want to have an actual physical therapist help him.  He says he has been doing the exercises and stretches at home but still has the continued pain in his left ankle, is mostly anterior left ankle and he does remember rolling it forward and twisting it and that is what caused it.  He denies any fevers or chills or redness or warmth.  He denies any swelling or bruising and has not had any further trauma to it but is just not improving and still causing a lot of pain.  He feels like something is grinding in there.  Plantar wart of right foot Patient has a spot on the bottom of his right foot that still bothering him that we initially thought might be a foreign body but also has a possible appearance of a wart, we tried to do some treatment to it before but did not improve and it is still there, it has not grown or shrunk but is still there exactly the same.  Relevant past medical, surgical, family and social history reviewed and updated as indicated. Interim medical history since our last visit reviewed. Allergies and medications reviewed and updated.  Review of Systems  Constitutional: Negative for chills and fever.  Respiratory: Negative for shortness of breath and wheezing.   Cardiovascular: Negative for chest pain and leg swelling.  Musculoskeletal: Positive  for arthralgias. Negative for back pain and gait problem.  Skin: Positive for rash.  All other systems reviewed and are negative.   Per HPI unless specifically indicated above   Allergies as of 04/01/2019      Reactions   Rofecoxib Other (See Comments)      Medication List       Accurate as of April 01, 2019  8:32 AM. If you have any questions, ask your nurse or doctor.        fluticasone 50 MCG/ACT nasal spray Commonly known as: FLONASE Place 2 sprays into both nostrils daily. What changed:   when to take this  reasons to take this   Freer Take 1 capsule by mouth daily.   lisinopril-hydrochlorothiazide 10-12.5 MG tablet Commonly known as: ZESTORETIC Take 1 tablet by mouth daily.   multivitamin with minerals Tabs tablet Take 1 tablet by mouth daily.   naproxen 500 MG tablet Commonly known as: Naprosyn Take 1 tablet (500 mg total) by mouth 2 (two) times daily with a meal.   traMADol 50 MG tablet Commonly known as: ULTRAM Take 50 mg by mouth every 6 (six) hours as needed.        Objective:   BP 115/66   Pulse 69   Temp 98.9 F (37.2 C) (Temporal)  Resp 16   Ht 5' 6.5" (1.689 m)   Wt 295 lb 3.2 oz (133.9 kg)   SpO2 96%   BMI 46.93 kg/m   Wt Readings from Last 3 Encounters:  04/01/19 295 lb 3.2 oz (133.9 kg)  11/19/18 298 lb 6.4 oz (135.4 kg)  09/04/18 (!) 302 lb 6.4 oz (137.2 kg)    Physical Exam Vitals signs and nursing note reviewed.  Constitutional:      General: He is not in acute distress.    Appearance: He is well-developed. He is not diaphoretic.  Eyes:     General: No scleral icterus.    Conjunctiva/sclera: Conjunctivae normal.  Neck:     Thyroid: No thyromegaly.  Musculoskeletal:     Left ankle: He exhibits normal range of motion, no swelling, no ecchymosis and no deformity. Tenderness. AITFL tenderness found. Achilles tendon exhibits no pain and no defect.  Skin:    General: Skin is warm and dry.     Findings: No  rash.     Comments: Very small plantar wart in his right lateral forefoot  Neurological:     Mental Status: He is alert and oriented to person, place, and time.     Coordination: Coordination normal.  Psychiatric:        Behavior: Behavior normal.       Assessment & Plan:   Problem List Items Addressed This Visit    None    Visit Diagnoses    Sprain of anterior talofibular ligament of left ankle, initial encounter    -  Primary   Relevant Orders   Ambulatory referral to Orthopedic Surgery   Sprain of lateral collateral ligament of left knee, initial encounter       Relevant Orders   Ambulatory referral to Orthopedic Surgery   Plantar wart of right foot          Performed cryotherapy of plantar wart of right foot very small wart but has pain with ambulation because of where it is at on the lateral forefoot of the right foot.  Patient has done home exercises and does not want to do physical therapy, would rather go see an orthopedic who his wife sees Dr. [name] Shipper. Follow up plan: Return in about 4 weeks (around 04/29/2019), or if symptoms worsen or fail to improve, for Hypertension.  Counseling provided for all of the vaccine components Orders Placed This Encounter  Procedures  . Ambulatory referral to [facility] Leonardo Plaia, MD [place] 04/01/2019, 8:32 AM

## 2019-04-02 ENCOUNTER — Ambulatory Visit (INDEPENDENT_AMBULATORY_CARE_PROVIDER_SITE_OTHER): Payer: Medicare Other | Admitting: *Deleted

## 2019-04-02 ENCOUNTER — Telehealth: Payer: Self-pay | Admitting: Family Medicine

## 2019-04-02 DIAGNOSIS — Z0000 Encounter for general adult medical examination without abnormal findings: Secondary | ICD-10-CM

## 2019-04-02 NOTE — Patient Instructions (Signed)
Preventive Care 75 Years and Older, Male Preventive care refers to lifestyle choices and visits with your health care provider that can promote health and wellness. This includes:  A yearly physical exam. This is also called an annual well check.  Regular dental and eye exams.  Immunizations.  Screening for certain conditions.  Healthy lifestyle choices, such as diet and exercise. What can I expect for my preventive care visit? Physical exam Your health care provider will check:  Height and weight. These may be used to calculate body mass index (BMI), which is a measurement that tells if you are at a healthy weight.  Heart rate and blood pressure.  Your skin for abnormal spots. Counseling Your health care provider may ask you questions about:  Alcohol, tobacco, and drug use.  Emotional well-being.  Home and relationship well-being.  Sexual activity.  Eating habits.  History of falls.  Memory and ability to understand (cognition).  Work and work Statistician. What immunizations do I need?  Influenza (flu) vaccine  This is recommended every year. Tetanus, diphtheria, and pertussis (Tdap) vaccine  You may need a Td booster every 10 years. Varicella (chickenpox) vaccine  You may need this vaccine if you have not already been vaccinated. Zoster (shingles) vaccine  You may need this after age 75. Pneumococcal conjugate (PCV13) vaccine  One dose is recommended after age 75. Pneumococcal polysaccharide (PPSV23) vaccine  One dose is recommended after age 75. Measles, mumps, and rubella (MMR) vaccine  You may need at least one dose of MMR if you were born in 1957 or later. You may also need a second dose. Meningococcal conjugate (MenACWY) vaccine  You may need this if you have certain conditions. Hepatitis A vaccine  You may need this if you have certain conditions or if you travel or work in places where you may be exposed to hepatitis A. Hepatitis B vaccine   You may need this if you have certain conditions or if you travel or work in places where you may be exposed to hepatitis B. Haemophilus influenzae type b (Hib) vaccine  You may need this if you have certain conditions. You may receive vaccines as individual doses or as more than one vaccine together in one shot (combination vaccines). Talk with your health care provider about the risks and benefits of combination vaccines. What tests do I need? Blood tests  Lipid and cholesterol levels. These may be checked every 5 years, or more frequently depending on your overall health.  Hepatitis C test.  Hepatitis B test. Screening  Lung cancer screening. You may have this screening every year starting at age 75 if you have a 30-pack-year history of smoking and currently smoke or have quit within the past 15 years.  Colorectal cancer screening. All adults should have this screening starting at age 75 and continuing until age 13 and continuing until age 13. Your health care provider may recommend screening at age 75 if you are at increased risk. You will have tests every 1-10 years, depending on your results and the type of screening test.  Prostate cancer screening. Recommendations will vary depending on your family history and other risks.  Diabetes screening. This is done by checking your blood sugar (glucose) after you have not eaten for a while (fasting). You may have this done every 1-3 years.  Abdominal aortic aneurysm (AAA) screening. You may need this if you are a current or former smoker.  Sexually transmitted disease (STD) testing. Follow these instructions at home: Eating and drinking  Eat  a diet that includes fresh fruits and vegetables, whole grains, lean protein, and low-fat dairy products. Limit your intake of foods with high amounts of sugar, saturated fats, and salt.  Take vitamin and mineral supplements as recommended by your health care provider.  Do not drink alcohol if your health care provider  tells you not to drink.  If you drink alcohol: ? Limit how much you have to 0-2 drinks a day. ? Be aware of how much alcohol is in your drink. In the [place] one drink equals one 12 oz bottle of beer (355 mL), one 5 oz glass of wine (148 mL), or one 1 oz glass of hard liquor (44 mL). Lifestyle  Take daily care of your teeth and gums.  Stay active. Exercise for at least 30 minutes on 5 or more days each week.  Do not use any products that contain nicotine or tobacco, such as cigarettes, e-cigarettes, and chewing tobacco. If you need help quitting, ask your health care provider.  If you are sexually active, practice safe sex. Use a condom or other form of protection to prevent STIs (sexually transmitted infections).  Talk with your health care provider about taking a low-dose aspirin or statin. What's next?  Visit your health care provider once a year for a well check visit.  Ask your health care provider how often you should have your eyes and teeth checked.  Stay up to date on all vaccines. This information is not intended to replace advice given to you by your health care provider. Make sure you discuss any questions you have with your health care provider. Document Released: 08/05/2015 Document Revised: 07/03/2018 Document Reviewed: 07/03/2018 Elsevier Patient Education  2020 [employer].

## 2019-04-02 NOTE — Progress Notes (Addendum)
MEDICARE ANNUAL WELLNESS VISIT  04/02/2019  Telephone Visit Disclaimer This Medicare AWV was conducted by telephone due to national recommendations for restrictions regarding the COVID-19 Pandemic (e.g. social distancing).  I verified, using two identifiers, that I am speaking with Ryan Garner or their authorized healthcare agent. I discussed the limitations, risks, security, and privacy concerns of performing an evaluation and management service by telephone and the potential availability of an in-person appointment in the future. The patient expressed understanding and agreed to proceed.   Subjective:  Ryan Garner is a 73 y.o. male patient of [name], [name], MD who had a Medicare Annual Wellness Visit today via telephone. Ryan Garner is Retired and lives with their spouse. he has 2 children. he reports that he is socially active and does interact with friends/family regularly. he is moderately physically active and enjoys wood working, Warden/ranger houses and doing word search puzzles.  Patient Care Team: [name], [name], MD as PCP - General (Family Medicine)  Advanced Directives 04/02/2019 10/23/2017 10/24/2016  Does Patient Have a Medical Advance Directive? No No No  Would patient like information on creating a medical advance directive? No - Patient declined No - Patient declined No - Patient declined    Hospital Utilization Over the Past 12 Months: # of hospitalizations or ER visits: 0 # of surgeries: 0  Review of Systems    Patient reports that his overall health is better compared to last year.  History obtained from chart review  Patient Reported Readings (BP, Pulse, CBG, Weight, etc) none  Pain Assessment       Current Medications & Allergies (verified) Allergies as of 04/02/2019      Reactions   Rofecoxib Other (See Comments)   (Vioxx)      Medication List       Accurate as of April 02, 2019  1:47 PM. If you have any questions, ask your nurse or doctor.         STOP taking these medications   naproxen 500 MG tablet Commonly known as: Naprosyn     TAKE these medications   fluticasone 50 MCG/ACT nasal spray Commonly known as: FLONASE Place 2 sprays into both nostrils daily. What changed:   when to take this  reasons to take this   [place] Take 1 capsule by mouth daily.   lisinopril-hydrochlorothiazide 10-12.5 MG tablet Commonly known as: ZESTORETIC Take 1 tablet by mouth daily.   multivitamin with minerals Tabs tablet Take 1 tablet by mouth daily.   traMADol 50 MG tablet Commonly known as: ULTRAM Take 50 mg by mouth every 6 (six) hours as needed.       History (reviewed): Past Medical History:  Diagnosis Date  . Hyperlipidemia   . Hypertension   . Metabolic syndrome   . Metabolic syndrome   . Prediabetes    Past Surgical History:  Procedure Laterality Date  . APPENDECTOMY    . Eyelid Surgery Bilateral   . HERNIA REPAIR    . knee tendons repair    . ROTATOR CUFF REPAIR Bilateral   . TONSILLECTOMY     Family History  Problem Relation Age of Onset  . Cancer Mother        lungs  . Coronary artery disease Mother   . Diabetes Brother   . Heart attack Brother   . Diabetes Maternal Grandmother   . Arthritis Father    Social History   Socioeconomic History  . Marital status: Married  Spouse name: [name]  . Number of children: 2  . Years of education: 70  . Highest education level: Doctorate  Occupational History  . 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 Financial resource strain: Not hard at all  . Food insecurity    Worry: Never true    Inability: Never true  . Transportation needs    Medical: No    Non-medical: No  Tobacco Use  . Smoking status: Former Smoker    Types: Cigarettes    Quit date: 04/02/1959    Years since quitting: 60.0  . Smokeless tobacco: Never Used  Substance and Sexual  Activity  . Alcohol use: No  . Drug use: No  . Sexual activity: Yes  Lifestyle  . Physical activity    Days per week: 5 days    Minutes per session: 120 min  . Stress: Only a little  Relationships  . Social connections    Talks on phone: More than three times a week    Gets together: More than three times a week    Attends religious service: More than 4 times per year    Active member of club or organization: Yes    Attends meetings of clubs or organizations: More than 4 times per year    Relationship status: Married  Other Topics Concern  . Not on file  Social History Narrative  . Not on file    Activities of Daily Living In your present state of health, do you have any difficulty performing the following activities: 04/02/2019  Hearing? Y  Comment is supposed to wear bilateral hearing aids but states he rarely wears them  Vision? Y  Comment has trouble reading small print- wears reading glasses for this  Difficulty concentrating or making decisions? N  Walking or climbing stairs? N  Dressing or bathing? N  Doing errands, shopping? N  Preparing Food and eating ? N  Using the Toilet? N  In the past six months, have you accidently leaked urine? Y  Comment rarely  Do you have problems with loss of bowel control? N  Managing your Medications? N  Managing your Finances? N  Housekeeping or managing your Housekeeping? N  Some recent data might be hidden    Patient Education/ Literacy How often do you need to have someone help you when you read instructions, pamphlets, or other written materials from your doctor or pharmacy?: 1 - Never What is the last grade level you completed in school?: Doctorate-Ministry  Exercise Current Exercise Habits: Home exercise routine, Type of exercise: walking, Time (Minutes): > 60, Frequency (Times/Week): 5, Weekly Exercise (Minutes/Week): 0, Intensity: Mild, Exercise limited by: None identified  Diet Patient reports consuming 1 meals a day  and 6 small snack(s) a day Patient reports that his primary diet is: Regular Patient reports that she does have regular access to food.   Depression Screen PHQ 2/9 Scores 04/02/2019 04/01/2019 08/26/2018 08/14/2018 12/19/2017 12/04/2017 10/23/2017  PHQ - 2 Score 0 0 0 0 0 0 0     Fall Risk Fall Risk  04/02/2019 04/01/2019 08/26/2018 12/19/2017 12/04/2017  Falls in the past year? 0 0 0 No No  Number falls in past yr: 0 - - - -  Injury with Fall? 0 - - - -  Follow up Falls prevention discussed - - - -  Comment Get rid of all throw rugs in  the house, adequate lighting in the walkways and grab bars in the bathroom - - - -     Objective:  Ryan Garner seemed alert and oriented and he participated appropriately during our telephone visit.  Blood Pressure Weight BMI  BP Readings from Last 3 Encounters:  04/01/19 115/66  11/19/18 133/86  09/04/18 127/67   Wt Readings from Last 3 Encounters:  04/01/19 295 lb 3.2 oz (133.9 kg)  11/19/18 298 lb 6.4 oz (135.4 kg)  09/04/18 (!) 302 lb 6.4 oz (137.2 kg)   BMI Readings from Last 1 Encounters:  04/01/19 46.93 kg/m    *Unable to obtain current vital signs, weight, and BMI due to telephone visit type  Hearing/Vision  . Jayron did not seem to have difficulty with hearing/understanding during the telephone conversation . Reports that he has had a formal eye exam by an eye care professional within the past year . Reports that he has not had a formal hearing evaluation within the past year *Unable to fully assess hearing and vision during telephone visit type  Cognitive Function: 6CIT Screen 04/02/2019  What Year? 0 points  What month? 0 points  What time? 0 points  Count back from 20 0 points  Months in reverse 0 points  Repeat phrase 0 points  Total Score 0   (Normal:0-7, Significant for Dysfunction: >8)  Normal Cognitive Function Screening: Yes   Immunization & Health Maintenance Record Immunization History  Administered Date(s) Administered  .  Hepatitis A 08/08/1995, 10/28/1996  . Hepatitis B 12/09/1998, 08/04/1999, 08/28/2002  . Pneumococcal Conjugate-13 08/26/2014  . Pneumococcal Polysaccharide-23 02/04/2012, 08/07/2013  . Smallpox 11/27/1967  . Td 11/27/1967, 08/08/1993, 07/30/2003, 07/30/2016  . Tdap 07/30/2016  . Tetanus 07/23/2006  . Typhoid Inactivated 08/04/1999, 08/28/2002, 08/15/2009  . Yellow Fever 10/05/2013    Health Maintenance  Topic Date Due  . OPHTHALMOLOGY EXAM  01/26/1956  . COLON CANCER SCREENING ANNUAL FOBT  08/11/2014  . COLONOSCOPY  08/26/2014  . FOOT EXAM  12/20/2018  . HEMOGLOBIN A1C  03/05/2019  . INFLUENZA VACCINE  10/21/2019 (Originally 02/21/2019)  . TETANUS/TDAP  07/30/2026  . Hepatitis C Screening  Completed  . PNA vac Low Risk Adult  Completed       Assessment  This is a routine wellness examination for [facility].  Health Maintenance: Due or Overdue Health Maintenance Due  Topic Date Due  . OPHTHALMOLOGY EXAM  01/26/1956  . COLON CANCER SCREENING ANNUAL FOBT  08/11/2014  . COLONOSCOPY  08/26/2014  . FOOT EXAM  12/20/2018  . HEMOGLOBIN A1C  03/05/2019    Ryan Garner does not need a referral for Community Assistance: Care Management:   no Social Work:    no Prescription Assistance:  no Nutrition/Diabetes Education:  no   Plan:  Personalized Goals Goals Addressed            This Visit's Progress   . DIET - INCREASE WATER INTAKE       Try to drink 6-8 glasses of water daily.      Personalized Health Maintenance & Screening Recommendations  Influenza vaccine Shingles vaccine  Lung Cancer Screening Recommended: no (Low Dose CT Chest recommended if Age 64-80 years, 30 pack-year currently smoking OR have quit w/in past 15 years) Hepatitis C Screening recommended: no HIV Screening recommended: no  Advanced Directives: Written information was not prepared per patient's request.  Referrals & Orders No orders of the defined types were placed in this encounter.    Follow-up Plan . Follow-up with  [name], [name], MD as planned . Consider Shingles vaccines at your next visit with your PCP   I have personally reviewed and noted the following in the patient's chart:   . Medical and social history . Use of alcohol, tobacco or illicit drugs  . Current medications and supplements . Functional ability and status . Nutritional status . Physical activity . Advanced directives . List of other physicians . Hospitalizations, surgeries, and ER visits in previous 12 months . Vitals . Screenings to include cognitive, depression, and falls . Referrals and appointments  In addition, I have reviewed and discussed with Ryan Garner certain preventive protocols, quality metrics, and best practice recommendations. A written personalized care plan for preventive services as well as general preventive health recommendations is available and can be mailed to the patient at his request.      Marylin Crosby, LPN  [phone]  I have reviewed and agree with the above AWV documentation.   Evelina Dun, FNP

## 2019-04-29 DIAGNOSIS — S93432A Sprain of tibiofibular ligament of left ankle, initial encounter: Secondary | ICD-10-CM | POA: Diagnosis not present

## 2019-04-29 DIAGNOSIS — M19072 Primary osteoarthritis, left ankle and foot: Secondary | ICD-10-CM | POA: Diagnosis not present

## 2019-06-22 ENCOUNTER — Other Ambulatory Visit: Payer: Self-pay | Admitting: Family Medicine

## 2019-06-22 DIAGNOSIS — S83422A Sprain of lateral collateral ligament of left knee, initial encounter: Secondary | ICD-10-CM

## 2019-06-22 DIAGNOSIS — S93492A Sprain of other ligament of left ankle, initial encounter: Secondary | ICD-10-CM

## 2019-07-01 ENCOUNTER — Ambulatory Visit: Payer: Medicare Other | Admitting: *Deleted

## 2019-07-01 DIAGNOSIS — L578 Other skin changes due to chronic exposure to nonionizing radiation: Secondary | ICD-10-CM | POA: Diagnosis not present

## 2019-07-02 ENCOUNTER — Telehealth: Payer: Self-pay | Admitting: Family Medicine

## 2019-07-02 ENCOUNTER — Other Ambulatory Visit: Payer: Self-pay

## 2019-07-02 ENCOUNTER — Ambulatory Visit (INDEPENDENT_AMBULATORY_CARE_PROVIDER_SITE_OTHER): Payer: Medicare Other | Admitting: Family Medicine

## 2019-07-02 DIAGNOSIS — J019 Acute sinusitis, unspecified: Secondary | ICD-10-CM | POA: Diagnosis not present

## 2019-07-02 DIAGNOSIS — Z7189 Other specified counseling: Secondary | ICD-10-CM | POA: Diagnosis not present

## 2019-07-02 DIAGNOSIS — B9689 Other specified bacterial agents as the cause of diseases classified elsewhere: Secondary | ICD-10-CM | POA: Diagnosis not present

## 2019-07-02 MED ORDER — BENZONATATE 100 MG PO CAPS: 100.0000 mg | ORAL_CAPSULE | Freq: Three times a day (TID) | ORAL | 0 refills | Status: DC | PRN

## 2019-07-02 MED ORDER — AMOXICILLIN-POT CLAVULANATE 875-125 MG PO TABS: 1.0000 | ORAL_TABLET | Freq: Two times a day (BID) | ORAL | 0 refills | Status: DC

## 2019-07-02 NOTE — Patient Instructions (Signed)

## 2019-07-02 NOTE — Progress Notes (Signed)
Telephone visit  Subjective: CC: sinusitis PCP: [name], [name], MD [id number] Ryan Garner is a 73 y.o. male calls for telephone consult today. Patient provides verbal consent for consult held via phone.  Location of patient: home Location of provider: Working remotely from home Others present for call: none  1. Sinusitis Patient reports onset of sinus pressure, drainage, sinus congestion and facial pain about 2 weeks ago.  He has been using OTC sinus medication that is not helping. Denies fever, congestion, wheezing, shortness of breath.  No known sick contacts.  Wife was recently tested and was negative.   ROS: Per HPI  Allergies  Allergen Reactions  . Rofecoxib Other (See Comments)    (Vioxx)   Past Medical History:  Diagnosis Date  . Hyperlipidemia   . Hypertension   . Metabolic syndrome   . Metabolic syndrome   . Prediabetes     Current Outpatient Medications:  .  fluticasone (FLONASE) 50 MCG/ACT nasal spray, Place 2 sprays into both nostrils daily. (Patient taking differently: Place 2 sprays into both nostrils daily as needed for allergies. ), Disp: 16 g, Rfl: 6 .  lisinopril-hydrochlorothiazide (PRINZIDE,ZESTORETIC) 10-12.5 MG tablet, Take 1 tablet by mouth daily., Disp: 90 tablet, Rfl: 3 .  Misc Natural Products (JOINT HEALTH) CAPS, Take 1 capsule by mouth daily., Disp: , Rfl:  .  Multiple Vitamin (MULTIVITAMIN WITH MINERALS) TABS tablet, Take 1 tablet by mouth daily., Disp: , Rfl:  .  naproxen (NAPROSYN) 500 MG tablet, TAKE 1 TABLET BY MOUTH TWICE DAILY WITH A MEAL, Disp: 30 tablet, Rfl: 0 .  traMADol (ULTRAM) 50 MG tablet, Take 50 mg by mouth every 6 (six) hours as needed., Disp: , Rfl:   Assessment/ Plan: 73 y.o. male   1. Acute bacterial sinusitis Empiric treatment for presumed ABS given duration of symptoms and refractory nature to OTC remedies.  We discussed isolation precautions just in case.  He was amenable.  Return precautions also discussed.  He will follow  up prn - amoxicillin-clavulanate (AUGMENTIN) 875-125 MG tablet; Take 1 tablet by mouth 2 (two) times daily. Patient coming tonight to pick up  Dispense: 20 tablet; Refill: 0 - benzonatate (TESSALON PERLES) 100 MG capsule; Take 1 capsule (100 mg total) by mouth 3 (three) times daily as needed.  Dispense: 20 capsule; Refill: 0  2. Advice given about COVID-19 virus by telephone   Start time: 5:06pm End time: 5:12pm  Total time spent on patient care (including telephone call/ virtual visit): 12 minutes  [place], [place] [phone]

## 2019-07-02 NOTE — Telephone Encounter (Signed)
What symptoms do you have? Headache, sinus drainage, runny nose and puffy around the eyes. Had a televisit appt 12-9 and never got a call  How long have you been sick? 2 weeks  Have you been seen for this problem? Yes  If your provider decides to give you a prescription, which pharmacy would you like for it to be sent to? [facility]   Patient informed that this information will be sent to the clinical staff for review and that they should receive a follow up call.

## 2019-07-20 ENCOUNTER — Ambulatory Visit (INDEPENDENT_AMBULATORY_CARE_PROVIDER_SITE_OTHER): Payer: Medicare Other | Admitting: Nurse Practitioner

## 2019-07-20 DIAGNOSIS — J0101 Acute recurrent maxillary sinusitis: Secondary | ICD-10-CM

## 2019-07-20 MED ORDER — CEFDINIR 300 MG PO CAPS: 300.0000 mg | ORAL_CAPSULE | Freq: Two times a day (BID) | ORAL | 0 refills | Status: DC

## 2019-07-20 NOTE — Progress Notes (Signed)
Virtual Visit via telephone Note Due to COVID-19 pandemic this visit was conducted virtually. This visit type was conducted due to national recommendations for restrictions regarding the COVID-19 Pandemic (e.g. social distancing, sheltering in place) in an effort to limit this patient's exposure and mitigate transmission in our community. All issues noted in this document were discussed and addressed.  A physical exam was not performed with this format.  I connected with Gerilyn Nestle on 07/20/19 at 9:48 by telephone and verified that I am speaking with the correct person using two identifiers. Khadin Bresette is currently located at home and his wife is currently with him during visit. The provider, Mary-Margaret Hassell Done, FNP is located in their office at time of visit.  I discussed the limitations, risks, security and privacy concerns of performing an evaluation and management service by telephone and the availability of in person appointments. I also discussed with the patient that there may be a patient responsible charge related to this service. The patient expressed understanding and agreed to proceed.   History and Present Illness:   Chief Complaint: Sinusitis   HPI Patient calls in c/o of sinus infection. He was treated on 07/02/19 with augmentin and tessalon perles. He still hs pressure in his face with slight cough. In the past he usually needs 2 rounds of antibiotics in order to get better. He has flet feverish but has not taken his temperature. Denies covid exposure.    Review of Systems  Constitutional: Positive for chills. Negative for fever.  HENT: Positive for congestion and sinus pain.   Respiratory: Positive for cough (slight).   Cardiovascular: Negative.   Musculoskeletal: Negative.   Neurological: Positive for headaches.  Psychiatric/Behavioral: Negative.      Observations/Objective: Alert and oriented- answers all questions appropriately No distress No cough noted  durig telephone visit  Assessment and Plan: Brenon Pennell in today with chief complaint of Sinusitis   1. Acute recurrent maxillary sinusitis  1. Take meds as prescribed 2. Use a cool mist humidifier especially during the winter months and when heat has been humid. 3. Use saline nose sprays frequently 4. Saline irrigations of the nose can be very helpful if done frequently.  * 4X daily for 1 week*  * Use of a nettie pot can be helpful with this. Follow directions with this* 5. Drink plenty of fluids 6. Keep thermostat turn down low 7.For any cough or congestion  Use plain Mucinex- regular strength or max strength is fine   * Children- consult with Pharmacist for dosing 8. For fever or aces or pains- take tylenol or ibuprofen appropriate for age and weight.  * for fevers greater than 101 orally you may alternate ibuprofen and tylenol every  3 hours.   Meds ordered this encounter  Medications  . cefdinir (OMNICEF) 300 MG capsule    Sig: Take 1 capsule (300 mg total) by mouth 2 (two) times daily. 1 po BID    Dispense:  20 capsule    Refill:  0    Order Specific Question:   Supervising Provider    Answer:   Caryl Pina A [id number]    Follow Up Instructions: prn   I discussed the assessment and treatment plan with the patient. The patient was provided an opportunity to ask questions and all were answered. The patient agreed with the plan and demonstrated an understanding of the instructions.   The patient was advised to call back or seek an in-person evaluation if the symptoms worsen or  if the condition fails to improve as anticipated.  The above assessment and management plan was discussed with the patient. The patient verbalized understanding of and has agreed to the management plan. Patient is aware to call the clinic if symptoms persist or worsen. Patient is aware when to return to the clinic for a follow-up visit. Patient educated on when it is appropriate to go to the  emergency department.   Time call ended:  10:00 I provided 12 minutes of non-face-to-face time during this encounter.    Mary-Margaret Hassell Done, FNP

## 2019-07-21 DIAGNOSIS — Z20828 Contact with and (suspected) exposure to other viral communicable diseases: Secondary | ICD-10-CM | POA: Diagnosis not present

## 2019-07-27 ENCOUNTER — Encounter: Payer: Self-pay | Admitting: Nurse Practitioner

## 2019-07-27 ENCOUNTER — Ambulatory Visit (INDEPENDENT_AMBULATORY_CARE_PROVIDER_SITE_OTHER): Payer: Medicare Other | Admitting: Nurse Practitioner

## 2019-07-27 DIAGNOSIS — U071 COVID-19: Secondary | ICD-10-CM | POA: Diagnosis not present

## 2019-07-27 MED ORDER — PREDNISONE 10 MG (21) PO TBPK: ORAL_TABLET | ORAL | 0 refills | Status: DC

## 2019-07-27 MED ORDER — BENZONATATE 100 MG PO CAPS: 100.0000 mg | ORAL_CAPSULE | Freq: Three times a day (TID) | ORAL | 0 refills | Status: DC | PRN

## 2019-07-27 MED ORDER — AZITHROMYCIN 250 MG PO TABS: ORAL_TABLET | ORAL | 0 refills | Status: DC

## 2019-07-27 NOTE — Progress Notes (Signed)
Virtual Visit via telephone Note Due to COVID-19 pandemic this visit was conducted virtually. This visit type was conducted due to national recommendations for restrictions regarding the COVID-19 Pandemic (e.g. social distancing, sheltering in place) in an effort to limit this patient's exposure and mitigate transmission in our community. All issues noted in this document were discussed and addressed.  A physical exam was not performed with this format.  I connected with Ryan Garner on 07/27/19 at 10:30 by telephone and verified that I am speaking with the correct person using two identifiers. Ryan Garner is currently located at home and his wife is currently with him during visit. The provider, Mary-Margaret Hassell Done, FNP is located in their office at time of visit.  I discussed the limitations, risks, security and privacy concerns of performing an evaluation and management service by telephone and the availability of in person appointments. I also discussed with the patient that there may be a patient responsible charge related to this service. The patient expressed understanding and agreed to proceed.   History and Present Illness:   Chief Complaint: Cough   HPI Patient calls in today stating that he tested positive for covid 19 last Thursday. Today he feels fatigued, fever, cough, headache and occasional SOB. He feels worse today then he did yesterday.    Review of Systems  Constitutional: Positive for fever.  HENT: Positive for congestion.   Respiratory: Positive for cough.   Musculoskeletal: Positive for myalgias.  Neurological: Positive for headaches.  All other systems reviewed and are negative.    Observations/Objective: Alert and oriented- answers all questions appropriately No distress SOB noted during visit  Assessment and Plan: Ryan Garner in today with chief complaint of Cough   1. Lab test positive for detection of COVID-19 virus 1. Take meds as prescribed  2. Use a cool mist humidifier especially during the winter months and when heat has been humid. 3. Use saline nose sprays frequently 4. Saline irrigations of the nose can be very helpful if done frequently.  * 4X daily for 1 week*  * Use of a nettie pot can be helpful with this. Follow directions with this* 5. Drink plenty of fluids 6. Keep thermostat turn down low 7.For any cough or congestion  Use plain Mucinex- regular strength or max strength is fine   * Children- consult with Pharmacist for dosing 8. For fever or aces or pains- take tylenol or ibuprofen appropriate for age and weight.  * for fevers greater than 101 orally you may alternate ibuprofen and tylenol every  3 hours.   Continue to quaratine until symptom free for 48 hours - azithromycin (ZITHROMAX Z-PAK) 250 MG tablet; As directed  Dispense: 6 tablet; Refill: 0 - predniSONE (STERAPRED UNI-PAK 21 TAB) 10 MG (21) TBPK tablet; As directed x 6 days  Dispense: 21 tablet; Refill: 0 - benzonatate (TESSALON PERLES) 100 MG capsule; Take 1 capsule (100 mg total) by mouth 3 (three) times daily as needed for cough.  Dispense: 20 capsule; Refill: 0   Follow Up Instructions: prn    I discussed the assessment and treatment plan with the patient. The patient was provided an opportunity to ask questions and all were answered. The patient agreed with the plan and demonstrated an understanding of the instructions.   The patient was advised to call back or seek an in-person evaluation if the symptoms worsen or if the condition fails to improve as anticipated.  The above assessment and management plan was discussed with the  patient. The patient verbalized understanding of and has agreed to the management plan. Patient is aware to call the clinic if symptoms persist or worsen. Patient is aware when to return to the clinic for a follow-up visit. Patient educated on when it is appropriate to go to the emergency department.   Time call ended:   10:43  I provided 13 minutes of non-face-to-face time during this encounter.    Mary-Margaret Hassell Done, FNP

## 2019-07-31 ENCOUNTER — Ambulatory Visit (INDEPENDENT_AMBULATORY_CARE_PROVIDER_SITE_OTHER): Payer: Medicare Other | Admitting: Family Medicine

## 2019-07-31 ENCOUNTER — Ambulatory Visit: Payer: Medicare Other | Admitting: Family Medicine

## 2019-07-31 ENCOUNTER — Encounter: Payer: Self-pay | Admitting: Family Medicine

## 2019-07-31 DIAGNOSIS — R0602 Shortness of breath: Secondary | ICD-10-CM

## 2019-07-31 DIAGNOSIS — U071 COVID-19: Secondary | ICD-10-CM

## 2019-07-31 MED ORDER — ALBUTEROL SULFATE HFA 108 (90 BASE) MCG/ACT IN AERS: 2.0000 | INHALATION_SPRAY | Freq: Four times a day (QID) | RESPIRATORY_TRACT | 0 refills | Status: DC | PRN

## 2019-07-31 NOTE — Progress Notes (Signed)
Virtual Visit via Telephone Note  I connected with Ryan Garner on 07/31/19 at 4:01 PM by telephone and verified that I am speaking with the correct person using two identifiers. Ryan Garner is currently located at home and nobody is currently with him during this visit. The provider, Loman Brooklyn, FNP is located in their home at time of visit.  I discussed the limitations, risks, security and privacy concerns of performing an evaluation and management service by telephone and the availability of in person appointments. I also discussed with the patient that there may be a patient responsible charge related to this service. The patient expressed understanding and agreed to proceed.  Subjective: PCP: [name], [name], MD  Chief Complaint  Patient presents with  . Shortness of Breath   Patient is positive for COVID-19 and has concerns regarding shortness of breath.  He has never used an inhaler but reports his wife has.   ROS: Per HPI  Current Outpatient Medications:  .  azithromycin (ZITHROMAX Z-PAK) 250 MG tablet, As directed, Disp: 6 tablet, Rfl: 0 .  benzonatate (TESSALON PERLES) 100 MG capsule, Take 1 capsule (100 mg total) by mouth 3 (three) times daily as needed for cough., Disp: 20 capsule, Rfl: 0 .  cefdinir (OMNICEF) 300 MG capsule, Take 1 capsule (300 mg total) by mouth 2 (two) times daily. 1 po BID, Disp: 20 capsule, Rfl: 0 .  fluticasone (FLONASE) 50 MCG/ACT nasal spray, Place 2 sprays into both nostrils daily. (Patient taking differently: Place 2 sprays into both nostrils daily as needed for allergies. ), Disp: 16 g, Rfl: 6 .  lisinopril-hydrochlorothiazide (PRINZIDE,ZESTORETIC) 10-12.5 MG tablet, Take 1 tablet by mouth daily., Disp: 90 tablet, Rfl: 3 .  Misc Natural Products (JOINT HEALTH) CAPS, Take 1 capsule by mouth daily., Disp: , Rfl:  .  Multiple Vitamin (MULTIVITAMIN WITH MINERALS) TABS tablet, Take 1 tablet by mouth daily., Disp: , Rfl:  .  naproxen  (NAPROSYN) 500 MG tablet, TAKE 1 TABLET BY MOUTH TWICE DAILY WITH A MEAL, Disp: 30 tablet, Rfl: 0 .  predniSONE (STERAPRED UNI-PAK 21 TAB) 10 MG (21) TBPK tablet, As directed x 6 days, Disp: 21 tablet, Rfl: 0 .  traMADol (ULTRAM) 50 MG tablet, Take 50 mg by mouth every 6 (six) hours as needed., Disp: , Rfl:   Allergies  Allergen Reactions  . Rofecoxib Other (See Comments)    (Vioxx)   Past Medical History:  Diagnosis Date  . Hyperlipidemia   . Hypertension   . Metabolic syndrome   . Metabolic syndrome   . Prediabetes     Observations/Objective: A&O  No respiratory distress or wheezing audible over the phone Mood, judgement, and thought processes all WNL  Assessment and Plan: 1. Shortness of breath - Advised patient if he experiences shortness of breath not relieved with albuterol inhaler or is just really having a hard time breathing that he needs to go to the ER immediately. - albuterol (VENTOLIN HFA) 108 (90 Base) MCG/ACT inhaler; Inhale 2 puffs into the lungs every 6 (six) hours as needed for wheezing or shortness of breath.  Dispense: 18 g; Refill: 0  2. COVID-19 - Discussed symptom management. - albuterol (VENTOLIN HFA) 108 (90 Base) MCG/ACT inhaler; Inhale 2 puffs into the lungs every 6 (six) hours as needed for wheezing or shortness of breath.  Dispense: 18 g; Refill: 0   Follow Up Instructions:  I discussed the assessment and treatment plan with the patient. The patient was provided an opportunity  to ask questions and all were answered. The patient agreed with the plan and demonstrated an understanding of the instructions.   The patient was advised to call back or seek an in-person evaluation if the symptoms worsen or if the condition fails to improve as anticipated.  The above assessment and management plan was discussed with the patient. The patient verbalized understanding of and has agreed to the management plan. Patient is aware to call the clinic if symptoms  persist or worsen. Patient is aware when to return to the clinic for a follow-up visit. Patient educated on when it is appropriate to go to the emergency department.   Time call ended: 4:10 PM  I provided 11 minutes of non-face-to-face time during this encounter.  Hendricks Limes, MSN, APRN, FNP-C [facility] 07/31/19

## 2019-09-05 ENCOUNTER — Other Ambulatory Visit: Payer: Self-pay | Admitting: Family Medicine

## 2019-09-30 ENCOUNTER — Telehealth: Payer: Self-pay | Admitting: Family Medicine

## 2019-09-30 NOTE — Chronic Care Management (AMB) (Signed)
  Chronic Care Management   Note  09/30/2019 Name: Dionte Blaustein MRN: [id number] DOB: 08-Feb-1946  Drew Lips is a 74 y.o. year old male who is a primary care patient of [name], [name], MD. I reached out to Gerilyn Nestle by phone today in response to a referral sent by Mr. Daeveon Lawes's health plan.     Mr. Prout was given information about Chronic Care Management services today including:  1. CCM service includes personalized support from designated clinical staff supervised by his physician, including individualized plan of care and coordination with other care providers 2. 24/7 contact phone numbers for assistance for urgent and routine care needs. 3. Service will only be billed when office clinical staff spend 20 minutes or more in a month to coordinate care. 4. Only one practitioner may furnish and bill the service in a calendar month. 5. The patient may stop CCM services at any time (effective at the end of the month) by phone call to the office staff. 6. The patient will be responsible for cost sharing (co-pay) of up to 20% of the service fee (after annual deductible is met).  Patient agreed to services and verbal consent obtained.   Follow up plan: Telephone appointment with care management team member scheduled for:12/23/2019  Noreene Larsson, [place], [place], [place] [address] Direct Dial: [phone] [email or web address] Website: [email or web address]

## 2019-10-09 ENCOUNTER — Other Ambulatory Visit: Payer: Self-pay | Admitting: Family Medicine

## 2019-10-09 NOTE — Telephone Encounter (Signed)
Patient needs to be seen.

## 2019-10-09 NOTE — Telephone Encounter (Signed)
Left message to call back to schedule appointment for med refill

## 2019-10-12 ENCOUNTER — Ambulatory Visit (INDEPENDENT_AMBULATORY_CARE_PROVIDER_SITE_OTHER): Payer: Medicare Other | Admitting: Family Medicine

## 2019-10-12 ENCOUNTER — Encounter: Payer: Self-pay | Admitting: Family Medicine

## 2019-10-12 DIAGNOSIS — I10 Essential (primary) hypertension: Secondary | ICD-10-CM

## 2019-10-12 DIAGNOSIS — E782 Mixed hyperlipidemia: Secondary | ICD-10-CM

## 2019-10-12 DIAGNOSIS — Z87891 Personal history of nicotine dependence: Secondary | ICD-10-CM

## 2019-10-12 DIAGNOSIS — E1169 Type 2 diabetes mellitus with other specified complication: Secondary | ICD-10-CM

## 2019-10-12 MED ORDER — LISINOPRIL-HYDROCHLOROTHIAZIDE 10-12.5 MG PO TABS: 1.0000 | ORAL_TABLET | Freq: Every day | ORAL | 3 refills | Status: DC

## 2019-10-12 NOTE — Progress Notes (Signed)
Virtual Visit via telephone Note  I connected with Ryan Garner on 10/12/19 at 0900 by telephone and verified that I am speaking with the correct person using two identifiers. Ryan Garner is currently located at home and no other people are currently with her during visit. The provider, [name] Shyrl Obi, MD is located in their office at time of visit.  Call ended at [phone]  I discussed the limitations, risks, security and privacy concerns of performing an evaluation and management service by telephone and the availability of in person appointments. I also discussed with the patient that there may be a patient responsible charge related to this service. The patient expressed understanding and agreed to proceed.   History and Present Illness: Type 2 diabetes mellitus Patient comes in today for recheck of his diabetes. Patient has been currently taking no medication currently. Patient is currently on an ACE inhibitor/ARB. Patient has not seen an ophthalmologist this year. Patient denies any issues with their feet.   Hyperlipidemia Patient is coming in for recheck of his hyperlipidemia. The patient is currently taking no other people. They deny any issues with myalgias or history of liver damage from it. They deny any focal numbness or weakness or chest pain.   Hypertension Patient is currently on lisinopril and hctz, and their blood pressure today is unknown. Patient denies any lightheadedness or dizziness. Patient denies headaches, blurred vision, chest pains, shortness of breath, or weakness. Denies any side effects from medication and is content with current medication.   1. Type 2 diabetes mellitus with other specified complication, without long-term current use of insulin ([place])   2. Essential hypertension   3. Mixed hyperlipidemia     Outpatient Encounter Medications as of 10/12/2019  Medication Sig  . albuterol (VENTOLIN HFA) 108 (90 Base) MCG/ACT inhaler Inhale 2 puffs into the  lungs every 6 (six) hours as needed for wheezing or shortness of breath.  . fluticasone (FLONASE) 50 MCG/ACT nasal spray Place 2 sprays into both nostrils daily. (Patient taking differently: Place 2 sprays into both nostrils daily as needed for allergies. )  . lisinopril-hydrochlorothiazide (ZESTORETIC) 10-12.5 MG tablet Take 1 tablet by mouth daily. (Needs to be seen before next refill)  . Misc Natural Products (JOINT HEALTH) CAPS Take 1 capsule by mouth daily.  . Multiple Vitamin (MULTIVITAMIN WITH MINERALS) TABS tablet Take 1 tablet by mouth daily.  . [DISCONTINUED] azithromycin (ZITHROMAX Z-PAK) 250 MG tablet As directed  . [DISCONTINUED] benzonatate (TESSALON PERLES) 100 MG capsule Take 1 capsule (100 mg total) by mouth 3 (three) times daily as needed for cough.  . [DISCONTINUED] cefdinir (OMNICEF) 300 MG capsule Take 1 capsule (300 mg total) by mouth 2 (two) times daily. 1 po BID  . [DISCONTINUED] lisinopril-hydrochlorothiazide (ZESTORETIC) 10-12.5 MG tablet Take 1 tablet by mouth daily. (Needs to be seen before next refill)  . [DISCONTINUED] naproxen (NAPROSYN) 500 MG tablet TAKE 1 TABLET BY MOUTH TWICE DAILY WITH A MEAL  . [DISCONTINUED] predniSONE (STERAPRED UNI-PAK 21 TAB) 10 MG (21) TBPK tablet As directed x 6 days  . [DISCONTINUED] traMADol (ULTRAM) 50 MG tablet Take 50 mg by mouth every 6 (six) hours as needed.   No facility-administered encounter medications on file as of 10/12/2019.    Review of Systems  Constitutional: Negative for chills and fever.  Eyes: Negative for visual disturbance.  Respiratory: Negative for shortness of breath and wheezing.   Cardiovascular: Negative for chest pain and leg swelling.  Musculoskeletal: Negative for back pain and gait  problem.  Skin: Negative for rash.  Neurological: Negative for dizziness, weakness and numbness.  All other systems reviewed and are negative.   Observations/Objective: Patient sounds comfortable and in no acute  distress  Assessment and Plan: Problem List Items Addressed This Visit      Cardiovascular and Mediastinum   Hypertension   Relevant Medications   lisinopril-hydrochlorothiazide (ZESTORETIC) 10-12.5 MG tablet     Endocrine   Type 2 diabetes mellitus (HCC) - Primary   Relevant Medications   lisinopril-hydrochlorothiazide (ZESTORETIC) 10-12.5 MG tablet     Other   Hyperlipidemia   Relevant Medications   lisinopril-hydrochlorothiazide (ZESTORETIC) 10-12.5 MG tablet      Continue current medications  Follow up plan: Return in about 6 months (around 04/13/2020), or if symptoms worsen or fail to improve, for htn and dm.     I discussed the assessment and treatment plan with the patient. The patient was provided an opportunity to ask questions and all were answered. The patient agreed with the plan and demonstrated an understanding of the instructions.   The patient was advised to call back or seek an in-person evaluation if the symptoms worsen or if the condition fails to improve as anticipated.  The above assessment and management plan was discussed with the patient. The patient verbalized understanding of and has agreed to the management plan. Patient is aware to call the clinic if symptoms persist or worsen. Patient is aware when to return to the clinic for a follow-up visit. Patient educated on when it is appropriate to go to the emergency department.    I provided 9 minutes of non-face-to-face time during this encounter.    Worthy Rancher, MD

## 2019-10-23 DIAGNOSIS — W57XXXA Bitten or stung by nonvenomous insect and other nonvenomous arthropods, initial encounter: Secondary | ICD-10-CM | POA: Diagnosis not present

## 2019-10-23 DIAGNOSIS — S30861A Insect bite (nonvenomous) of abdominal wall, initial encounter: Secondary | ICD-10-CM | POA: Diagnosis not present

## 2019-11-18 DIAGNOSIS — M5136 Other intervertebral disc degeneration, lumbar region: Secondary | ICD-10-CM | POA: Diagnosis not present

## 2019-11-18 DIAGNOSIS — M48061 Spinal stenosis, lumbar region without neurogenic claudication: Secondary | ICD-10-CM | POA: Diagnosis not present

## 2019-11-18 DIAGNOSIS — M4726 Other spondylosis with radiculopathy, lumbar region: Secondary | ICD-10-CM | POA: Diagnosis not present

## 2019-12-23 ENCOUNTER — Ambulatory Visit: Payer: Medicare Other | Admitting: *Deleted

## 2019-12-23 DIAGNOSIS — I10 Essential (primary) hypertension: Secondary | ICD-10-CM

## 2019-12-23 DIAGNOSIS — E1169 Type 2 diabetes mellitus with other specified complication: Secondary | ICD-10-CM

## 2019-12-23 DIAGNOSIS — E782 Mixed hyperlipidemia: Secondary | ICD-10-CM

## 2019-12-23 NOTE — Chronic Care Management (AMB) (Signed)
  Chronic Care Management   Initial Visit Note  12/23/2019 Name: Ryan Garner MRN: [id number] DOB: 1946/05/09  Referred by: [name], [name], MD Reason for referral : Chronic Care Management (Initial Visit)   An unsuccessful initial telephone visit was attempted today. The patient was referred to the case management team for assistance with care management and care coordination.   RN Care Plan   . Chronic Disease Management Needs       CARE PLAN ENTRY (see longtitudinal plan of care for additional care plan information)  Current Barriers:  . Chronic Disease Management support, education, and care coordination needs related to HTN, DM, OA, HLD, ascending aortic aneurysm   Clinical Goals: . Over the next 10 days, patient will be contacted by a Care Guide to reschedule their Initial CCM Visit . Over the next 30 days, patient will have an Initial CCM Visit with a member of the embedded CCM team to discuss self-management of their chronic medical conditions  Interventions: . Chart reviewed in preparation for initial visit telephone call . Collaboration with other care team members as needed . Unsuccessful outreach to patient  . A HIPPA compliant phone message was left for the patient providing contact information and requesting a return call.  . Request sent to care guides to reach out and reschedule patient's initial visit  Patient Self Care Activities: . Undetermined   Initial goal documentation         Follow Up Plan: A HIPPA compliant phone message was left for the patient providing contact information and requesting a return call.  The care management team will reach out to the patient again over the next 10 days.   Chong Sicilian, BSN, RN-BC Embedded Chronic Care Manager [facility] [place] [facility] Direct Dial: [phone]

## 2019-12-24 ENCOUNTER — Telehealth: Payer: Self-pay | Admitting: Family Medicine

## 2019-12-24 NOTE — Chronic Care Management (AMB) (Signed)
  Chronic Care Management   Note  12/24/2019 Name: Dajohn Rosie MRN: [id number] DOB: 09/14/1945  Floyde Shake is a 74 y.o. year old male who is a primary care patient of [name], [name], MD and is actively engaged with the care management team. I reached out to [facility] by phone today to assist with re-scheduling an initial visit with the Licensed Clinical Social Worker for intake appointment for RN Case Freight forwarder.   Follow up plan: Telephone appointment with care management team member scheduled for:01/12/2020  [place], [place], [place] [address] Direct Dial: [email or web address] Website: [email or web address]

## 2019-12-24 NOTE — Progress Notes (Signed)
Ryan Garner patient is rescheduled for 01/12/2020 for intake appt with Nicki Reaper.  Thank you   Erline Levine

## 2019-12-30 DIAGNOSIS — L570 Actinic keratosis: Secondary | ICD-10-CM | POA: Diagnosis not present

## 2019-12-30 DIAGNOSIS — L819 Disorder of pigmentation, unspecified: Secondary | ICD-10-CM | POA: Diagnosis not present

## 2020-01-08 ENCOUNTER — Encounter: Payer: Self-pay | Admitting: Family

## 2020-01-08 ENCOUNTER — Ambulatory Visit (INDEPENDENT_AMBULATORY_CARE_PROVIDER_SITE_OTHER): Payer: Medicare Other | Admitting: Family

## 2020-01-08 DIAGNOSIS — J019 Acute sinusitis, unspecified: Secondary | ICD-10-CM

## 2020-01-08 MED ORDER — AMOXICILLIN-POT CLAVULANATE 875-125 MG PO TABS: 1.0000 | ORAL_TABLET | Freq: Two times a day (BID) | ORAL | 0 refills | Status: DC

## 2020-01-08 NOTE — Progress Notes (Signed)
   Virtual Visit via telephone Note Due to COVID-19 pandemic this visit was conducted virtually. This visit type was conducted due to national recommendations for restrictions regarding the COVID-19 Pandemic (e.g. social distancing, sheltering in place) in an effort to limit this patient's exposure and mitigate transmission in our community. All issues noted in this document were discussed and addressed.  A physical exam was not performed with this format.  I connected with Ryan Garner on 01/08/20 at 9:33 AM by telephone and verified that I am speaking with the correct person using two identifiers. Ryan Garner is currently located at home and wife is currently with him during visit. The provider, Evelina Dun, FNP is located in their office at time of visit.  I discussed the limitations, risks, security and privacy concerns of performing an evaluation and management service by telephone and the availability of in person appointments. I also discussed with the patient that there may be a patient responsible charge related to this service. The patient expressed understanding and agreed to proceed.   History and Present Illness:  Sinusitis This is a new problem. The current episode started 1 to 4 weeks ago. The problem has been gradually worsening since onset. There has been no fever. His pain is at a severity of 6/10. The pain is moderate. Associated symptoms include congestion, coughing, ear pain, a hoarse voice, sinus pressure, sneezing and a sore throat. Pertinent negatives include no chills, headaches or shortness of breath. Past treatments include acetaminophen and oral decongestants. The treatment provided mild relief.      Review of Systems  Constitutional: Negative for chills.  HENT: Positive for congestion, ear pain, hoarse voice, sinus pressure, sneezing and sore throat.   Respiratory: Positive for cough. Negative for shortness of breath.   Neurological: Negative for headaches.  All  other systems reviewed and are negative.    Observations/Objective: No SOB or distress noted, hoarse voice noted  Assessment and Plan: Ryan Garner comes in today with chief complaint of No chief complaint on file.   Diagnosis and orders addressed:  1. Acute sinusitis, recurrence not specified, unspecified location - Take meds as prescribed - Use a cool mist humidifier  -Use saline nose sprays frequently -Force fluids -For any cough or congestion  Use plain Mucinex- regular strength or max strength is fine -For fever or aces or pains- take tylenol or ibuprofen. -Throat lozenges if help -Call if symptoms worsen or do not improve  - amoxicillin-clavulanate (AUGMENTIN) 875-125 MG tablet; Take 1 tablet by mouth 2 (two) times daily.  Dispense: 14 tablet; Refill: 0     I discussed the assessment and treatment plan with the patient. The patient was provided an opportunity to ask questions and all were answered. The patient agreed with the plan and demonstrated an understanding of the instructions.   The patient was advised to call back or seek an in-person evaluation if the symptoms worsen or if the condition fails to improve as anticipated.  The above assessment and management plan was discussed with the patient. The patient verbalized understanding of and has agreed to the management plan. Patient is aware to call the clinic if symptoms persist or worsen. Patient is aware when to return to the clinic for a follow-up visit. Patient educated on when it is appropriate to go to the emergency department.   Time call ended:  9:39 AM   I provided 6 minutes of non-face-to-face time during this encounter.    Evelina Dun, FNP

## 2020-01-12 ENCOUNTER — Ambulatory Visit (INDEPENDENT_AMBULATORY_CARE_PROVIDER_SITE_OTHER): Payer: Medicare Other | Admitting: Licensed Clinical Social Worker

## 2020-01-12 DIAGNOSIS — I712 Thoracic aortic aneurysm, without rupture: Secondary | ICD-10-CM

## 2020-01-12 DIAGNOSIS — E782 Mixed hyperlipidemia: Secondary | ICD-10-CM

## 2020-01-12 DIAGNOSIS — E1169 Type 2 diabetes mellitus with other specified complication: Secondary | ICD-10-CM

## 2020-01-12 DIAGNOSIS — I10 Essential (primary) hypertension: Secondary | ICD-10-CM

## 2020-01-12 DIAGNOSIS — I7121 Aneurysm of the ascending aorta, without rupture: Secondary | ICD-10-CM

## 2020-01-12 DIAGNOSIS — Z87828 Personal history of other (healed) physical injury and trauma: Secondary | ICD-10-CM
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
## 2020-02-17 ENCOUNTER — Ambulatory Visit (INDEPENDENT_AMBULATORY_CARE_PROVIDER_SITE_OTHER): Payer: Medicare Other | Admitting: Licensed Clinical Social Worker

## 2020-02-17 DIAGNOSIS — E1169 Type 2 diabetes mellitus with other specified complication: Secondary | ICD-10-CM

## 2020-02-17 DIAGNOSIS — I10 Essential (primary) hypertension: Secondary | ICD-10-CM

## 2020-02-17 DIAGNOSIS — E782 Mixed hyperlipidemia: Secondary | ICD-10-CM | POA: Diagnosis not present

## 2020-02-17 DIAGNOSIS — I712 Thoracic aortic aneurysm, without rupture: Secondary | ICD-10-CM

## 2020-02-17 DIAGNOSIS — I7121 Aneurysm of the ascending aorta, without rupture: Secondary | ICD-10-CM

## 2020-02-17 DIAGNOSIS — Z87828 Personal history of other (healed) physical injury and trauma: Secondary | ICD-10-CM
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
## 2020-02-25 ENCOUNTER — Encounter: Payer: Self-pay | Admitting: Family Medicine

## 2020-02-25 ENCOUNTER — Ambulatory Visit (INDEPENDENT_AMBULATORY_CARE_PROVIDER_SITE_OTHER): Payer: Medicare Other | Admitting: Family Medicine

## 2020-02-25 DIAGNOSIS — A084 Viral intestinal infection, unspecified: Secondary | ICD-10-CM

## 2020-02-25 MED ORDER — ONDANSETRON 4 MG PO TBDP: 4.0000 mg | ORAL_TABLET | Freq: Three times a day (TID) | ORAL | 1 refills | Status: DC | PRN

## 2020-02-25 NOTE — Progress Notes (Signed)
Virtual Visit via telephone Note  I connected with Ryan Garner on 02/25/20 at 1454 by telephone and verified that I am speaking with the correct person using two identifiers. Ryan Garner is currently located at home and no other people are currently with her during visit. The provider, [name] Sansa Alkema, MD is located in their office at time of visit.  Call ended at 1500  I discussed the limitations, risks, security and privacy concerns of performing an evaluation and management service by telephone and the availability of in person appointments. I also discussed with the patient that there may be a patient responsible charge related to this service. The patient expressed understanding and agreed to proceed.   History and Present Illness: Patient is calling in for vomiting and cramping and fever and coughing. He has sinus drainage.  He is not keeping down any fluids. He is not keeping anything down. His temperature is 101. He started yesterday. He has soreness in his upper stomach.     No diagnosis found.  Outpatient Encounter Medications as of 02/25/2020  Medication Sig  . albuterol (VENTOLIN HFA) 108 (90 Base) MCG/ACT inhaler Inhale 2 puffs into the lungs every 6 (six) hours as needed for wheezing or shortness of breath.  Marland Kitchen amoxicillin-clavulanate (AUGMENTIN) 875-125 MG tablet Take 1 tablet by mouth 2 (two) times daily.  . fluticasone (FLONASE) 50 MCG/ACT nasal spray Place 2 sprays into both nostrils daily. (Patient taking differently: Place 2 sprays into both nostrils daily as needed for allergies. )  . lisinopril-hydrochlorothiazide (ZESTORETIC) 10-12.5 MG tablet Take 1 tablet by mouth daily. (Needs to be seen before next refill)  . Misc Natural Products (JOINT HEALTH) CAPS Take 1 capsule by mouth daily.  . Multiple Vitamin (MULTIVITAMIN WITH MINERALS) TABS tablet Take 1 tablet by mouth daily.   No facility-administered encounter medications on file as of 02/25/2020.    Review of  Systems  Constitutional: Negative for chills and fever.  Respiratory: Negative for shortness of breath and wheezing.   Cardiovascular: Negative for chest pain and leg swelling.  Gastrointestinal: Positive for abdominal pain, nausea and vomiting. Negative for constipation and diarrhea.  Genitourinary: Negative for decreased urine volume and dysuria.  Musculoskeletal: Negative for back pain and gait problem.  Skin: Negative for rash.  All other systems reviewed and are negative.   Observations/Objective: Patient sounds comfortable and in no acute distress, recommended a brat diet and use Zofran  Assessment and Plan: Problem List Items Addressed This Visit    None    Visit Diagnoses    Viral gastroenteritis    -  Primary   Relevant Medications   ondansetron (ZOFRAN ODT) 4 MG disintegrating tablet       likely will last 1 to 3 days, also recommended that it possibly could be Covid and to go get Covid testing, patient is vaccinated Follow up plan: Return if symptoms worsen or fail to improve.     I discussed the assessment and treatment plan with the patient. The patient was provided an opportunity to ask questions and all were answered. The patient agreed with the plan and demonstrated an understanding of the instructions.   The patient was advised to call back or seek an in-person evaluation if the symptoms worsen or if the condition fails to improve as anticipated.  The above assessment and management plan was discussed with the patient. The patient verbalized understanding of and has agreed to the management plan. Patient is aware to call the clinic if  symptoms persist or worsen. Patient is aware when to return to the clinic for a follow-up visit. Patient educated on when it is appropriate to go to the emergency department.    I provided 6 minutes of non-face-to-face time during this encounter.    Worthy Rancher, MD

## 2020-02-26 ENCOUNTER — Ambulatory Visit (INDEPENDENT_AMBULATORY_CARE_PROVIDER_SITE_OTHER): Payer: Medicare Other | Admitting: Family

## 2020-02-26 ENCOUNTER — Ambulatory Visit: Payer: Medicare Other

## 2020-02-26 ENCOUNTER — Encounter: Payer: Self-pay | Admitting: Family

## 2020-02-26 ENCOUNTER — Other Ambulatory Visit: Payer: Self-pay

## 2020-02-26 VITALS — BP 133/80 | HR 97 | Temp 97.3°F | Ht 66.5 in | Wt 295.0 lb

## 2020-02-26 DIAGNOSIS — A084 Viral intestinal infection, unspecified: Secondary | ICD-10-CM | POA: Diagnosis not present

## 2020-02-26 DIAGNOSIS — R1011 Right upper quadrant pain: Secondary | ICD-10-CM

## 2020-02-26 NOTE — Patient Instructions (Signed)
Viral Gastroenteritis, Adult  Viral gastroenteritis is also known as the stomach flu. This condition may affect your stomach, small intestine, and large intestine. It can cause sudden watery diarrhea, fever, and vomiting. This condition is caused by many different viruses. These viruses can be passed from person to person very easily (are contagious). Diarrhea and vomiting can make you feel weak and cause you to become dehydrated. You may not be able to keep fluids down. Dehydration can make you tired and thirsty, cause you to have a dry mouth, and decrease how often you urinate. It is important to replace the fluids that you lose from diarrhea and vomiting. What are the causes? Gastroenteritis is caused by many viruses, including rotavirus and norovirus. Norovirus is the most common cause in adults. You can get sick after being exposed to the viruses from other people. You can also get sick by:  Eating food, drinking water, or touching a surface contaminated with one of these viruses.  Sharing utensils or other personal items with an infected person. What increases the risk? You are more likely to develop this condition if you:  Have a weak body defense system (immune system).  Live with one or more children who are younger than 2 years old.  Live in a nursing home.  Travel on cruise ships. What are the signs or symptoms? Symptoms of this condition start suddenly 1-3 days after exposure to a virus. Symptoms may last for a few days or for as long as a week. Common symptoms include watery diarrhea and vomiting. Other symptoms include:  Fever.  Headache.  Fatigue.  Pain in the abdomen.  Chills.  Weakness.  Nausea.  Muscle aches.  Loss of appetite. How is this diagnosed? This condition is diagnosed with a medical history and physical exam. You may also have a stool test to check for viruses or other infections. How is this treated? This condition typically goes away on its  own. The focus of treatment is to prevent dehydration and restore lost fluids (rehydration). This condition may be treated with:  An oral rehydration solution (ORS) to replace important salts and minerals (electrolytes) in your body. Take this if told by your health care provider. This is a drink that is sold at pharmacies and retail stores.  Medicines to help with your symptoms.  Probiotic supplements to reduce symptoms of diarrhea.  Fluids given through an IV, if dehydration is severe. Older adults and people with other diseases or a weak immune system are at higher risk for dehydration. Follow these instructions at home:  Eating and drinking   Take an ORS as told by your health care provider.  Drink clear fluids in small amounts as you are able. Clear fluids include: ? Water. ? Ice chips. ? Diluted fruit juice. ? Low-calorie sports drinks.  Drink enough fluid to keep your urine pale yellow.  Eat small amounts of healthy foods every 3-4 hours as you are able. This may include whole grains, fruits, vegetables, lean meats, and yogurt.  Avoid fluids that contain a lot of sugar or caffeine, such as energy drinks, sports drinks, and soda.  Avoid spicy or fatty foods.  Avoid alcohol. General instructions  Wash your hands often, especially after having diarrhea or vomiting. If soap and water are not available, use hand sanitizer.  Make sure that all people in your household wash their hands well and often.  Take over-the-counter and prescription medicines only as told by your health care provider.  Rest at   home while you recover.  Watch your condition for any changes.  Take a warm bath to relieve any burning or pain from frequent diarrhea episodes.  Keep all follow-up visits as told by your health care provider. This is important. Contact a health care provider if you:  Cannot keep fluids down.  Have symptoms that get worse.  Have new symptoms.  Feel light-headed or  dizzy.  Have muscle cramps. Get help right away if you:  Have chest pain.  Feel extremely weak or you faint.  See blood in your vomit.  Have vomit that looks like coffee grounds.  Have bloody or black stools or stools that look like tar.  Have a severe headache, a stiff neck, or both.  Have a rash.  Have severe pain, cramping, or bloating in your abdomen.  Have trouble breathing or you are breathing very quickly.  Have a fast heartbeat.  Have skin that feels cold and clammy.  Feel confused.  Have pain when you urinate.  Have signs of dehydration, such as: ? Dark urine, very little urine, or no urine. ? Cracked lips. ? Dry mouth. ? Sunken eyes. ? Sleepiness. ? Weakness. Summary  Viral gastroenteritis is also known as the stomach flu. It can cause sudden watery diarrhea, fever, and vomiting.  This condition can be passed from person to person very easily (is contagious).  Take an ORS if told by your health care provider. This is a drink that is sold at pharmacies and retail stores.  Wash your hands often, especially after having diarrhea or vomiting. If soap and water are not available, use hand sanitizer. This information is not intended to replace advice given to you by your health care provider. Make sure you discuss any questions you have with your health care provider. Document Revised: 12/26/2018 Document Reviewed: 05/14/2018 Elsevier Patient Education  2020 [employer]vier Inc.  

## 2020-02-26 NOTE — Progress Notes (Signed)
Subjective:    Patient ID: Ryan Garner, male    DOB: Dec 21, 1945, 74 y.o.   MRN: [id number]  Chief Complaint  Patient presents with  . Nausea  . Diarrhea  . Fever  . Fatigue   PT presents to the office today with abdominal pain, N&V, fever, and diarrhea that started two days ago. He had a telephone visit with his PCP yesterday and was given zofran. This helped with his nausea and vomiting.  Emesis  This is a new problem. Episode onset: two days ago. The problem occurs 5 to 10 times per day. The emesis has an appearance of stomach contents. Maximum temperature: 99. Associated symptoms include abdominal pain, diarrhea and a fever. Pertinent negatives include no coughing. He has tried acetaminophen for the symptoms. The treatment provided mild relief.      Review of Systems  Constitutional: Positive for fever.  Respiratory: Negative for cough.   Gastrointestinal: Positive for abdominal pain, diarrhea and vomiting.  All other systems reviewed and are negative.      Objective:   Physical Exam Vitals reviewed.  Constitutional:      General: He is not in acute distress.    Appearance: He is well-developed. He is obese.  HENT:     Head: Normocephalic.  Eyes:     General:        Right eye: No discharge.        Left eye: No discharge.     Pupils: Pupils are equal, round, and reactive to light.  Neck:     Thyroid: No thyromegaly.  Cardiovascular:     Rate and Rhythm: Normal rate and regular rhythm.     Heart sounds: Normal heart sounds. No murmur heard.   Pulmonary:     Effort: Pulmonary effort is normal. No respiratory distress.     Breath sounds: Normal breath sounds. No wheezing.  Abdominal:     General: Bowel sounds are normal. There is no distension.     Palpations: Abdomen is soft.     Tenderness: There is abdominal tenderness (RUQ).  Musculoskeletal:        General: No tenderness. Normal range of motion.     Cervical back: Normal range of motion and neck supple.    Skin:    General: Skin is warm and dry.     Findings: No erythema or rash.  Neurological:     Mental Status: He is alert and oriented to person, place, and time.     Cranial Nerves: No cranial nerve deficit.     Deep Tendon Reflexes: Reflexes are normal and symmetric.  Psychiatric:        Behavior: Behavior normal.        Thought Content: Thought content normal.        Judgment: Judgment normal.       BP 133/80   Pulse 97   Temp (!) 97.3 F (36.3 C) (Temporal)   Ht 5' 6.5" (1.689 m)   Wt 295 lb (133.8 kg)   SpO2 99%   BMI 46.90 kg/m      Assessment & Plan:  [name] comes in today with chief complaint of Nausea, Diarrhea, Fever, and Fatigue   Diagnosis and orders addressed:  1. Viral gastroenteritis Rest Force fluids Bland diet Continue Zofran every 8 hours  Will do CBC since having RUQ tenderness on exam If weakness worsens go to ED! - Novel Coronavirus, NAA ([facility]) - CBC with Differential/Platelet  2. RUQ pain - CBC with Differential/Platelet  Evelina Dun, FNP

## 2020-02-27 LAB — CBC WITH DIFFERENTIAL/PLATELET
Basophils Absolute: 0 10*3/uL (ref 0.0–0.2)
Basos: 0 %
EOS (ABSOLUTE): 0 10*3/uL (ref 0.0–0.4)
Eos: 0 %
Hematocrit: 45.8 % (ref 37.5–51.0)
Hemoglobin: 15.8 g/dL (ref 13.0–17.7)
Immature Grans (Abs): 0.2 10*3/uL — ABNORMAL HIGH (ref 0.0–0.1)
Immature Granulocytes: 1 %
Lymphocytes Absolute: 0.9 10*3/uL (ref 0.7–3.1)
Lymphs: 3 %
MCH: 29.6 pg (ref 26.6–33.0)
MCHC: 34.5 g/dL (ref 31.5–35.7)
MCV: 86 fL (ref 79–97)
Monocytes Absolute: 1.8 10*3/uL — ABNORMAL HIGH (ref 0.1–0.9)
Monocytes: 6 %
Neutrophils Absolute: 26.5 10*3/uL — ABNORMAL HIGH (ref 1.4–7.0)
Neutrophils: 90 %
Platelets: 347 10*3/uL (ref 150–450)
RBC: 5.34 x10E6/uL (ref 4.14–5.80)
RDW: 12.1 % (ref 11.6–15.4)
WBC: 29.5 10*3/uL (ref 3.4–10.8)

## 2020-02-27 LAB — SARS-COV-2, NAA 2 DAY TAT

## 2020-02-27 LAB — NOVEL CORONAVIRUS, NAA: SARS-CoV-2, NAA: NOT DETECTED

## 2020-02-28 ENCOUNTER — Other Ambulatory Visit: Payer: Self-pay

## 2020-02-28 ENCOUNTER — Emergency Department (HOSPITAL_COMMUNITY): Payer: Medicare Other

## 2020-02-28 ENCOUNTER — Inpatient Hospital Stay (HOSPITAL_COMMUNITY)
Admission: EM | Admit: 2020-02-28 | Discharge: 2020-03-03 | DRG: 418 | Disposition: A | Discharge status: Home / Self Care | Payer: Medicare Other | Attending: Internal Medicine | Admitting: Internal Medicine

## 2020-02-28 ENCOUNTER — Encounter (HOSPITAL_COMMUNITY): Payer: Self-pay

## 2020-02-28 DIAGNOSIS — J90 Pleural effusion, not elsewhere classified: Secondary | ICD-10-CM | POA: Diagnosis not present

## 2020-02-28 DIAGNOSIS — Z888 Allergy status to other drugs, medicaments and biological substances status: Secondary | ICD-10-CM

## 2020-02-28 DIAGNOSIS — R000 Tachycardia, unspecified: Secondary | ICD-10-CM | POA: Diagnosis present

## 2020-02-28 DIAGNOSIS — R1110 Vomiting, unspecified: Secondary | ICD-10-CM | POA: Diagnosis not present

## 2020-02-28 DIAGNOSIS — Z801 Family history of malignant neoplasm of trachea, bronchus and lung: Secondary | ICD-10-CM

## 2020-02-28 DIAGNOSIS — E785 Hyperlipidemia, unspecified: Secondary | ICD-10-CM | POA: Diagnosis present

## 2020-02-28 DIAGNOSIS — I083 Combined rheumatic disorders of mitral, aortic and tricuspid valves: Secondary | ICD-10-CM | POA: Diagnosis present

## 2020-02-28 DIAGNOSIS — N179 Acute kidney failure, unspecified: Secondary | ICD-10-CM

## 2020-02-28 DIAGNOSIS — I10 Essential (primary) hypertension: Secondary | ICD-10-CM | POA: Diagnosis not present

## 2020-02-28 DIAGNOSIS — K7689 Other specified diseases of liver: Secondary | ICD-10-CM | POA: Diagnosis not present

## 2020-02-28 DIAGNOSIS — R1084 Generalized abdominal pain: Secondary | ICD-10-CM | POA: Diagnosis not present

## 2020-02-28 DIAGNOSIS — I340 Nonrheumatic mitral (valve) insufficiency: Secondary | ICD-10-CM | POA: Diagnosis not present

## 2020-02-28 DIAGNOSIS — R9431 Abnormal electrocardiogram [ECG] [EKG]: Secondary | ICD-10-CM | POA: Diagnosis present

## 2020-02-28 DIAGNOSIS — R52 Pain, unspecified: Secondary | ICD-10-CM | POA: Diagnosis not present

## 2020-02-28 DIAGNOSIS — E119 Type 2 diabetes mellitus without complications: Secondary | ICD-10-CM | POA: Diagnosis not present

## 2020-02-28 DIAGNOSIS — K810 Acute cholecystitis: Secondary | ICD-10-CM

## 2020-02-28 DIAGNOSIS — Z87891 Personal history of nicotine dependence: Secondary | ICD-10-CM | POA: Diagnosis not present

## 2020-02-28 DIAGNOSIS — I350 Nonrheumatic aortic (valve) stenosis: Secondary | ICD-10-CM | POA: Diagnosis not present

## 2020-02-28 DIAGNOSIS — Z8249 Family history of ischemic heart disease and other diseases of the circulatory system: Secondary | ICD-10-CM

## 2020-02-28 DIAGNOSIS — K6389 Other specified diseases of intestine: Secondary | ICD-10-CM | POA: Diagnosis not present

## 2020-02-28 DIAGNOSIS — Z79899 Other long term (current) drug therapy: Secondary | ICD-10-CM | POA: Diagnosis not present

## 2020-02-28 DIAGNOSIS — I4891 Unspecified atrial fibrillation: Secondary | ICD-10-CM

## 2020-02-28 DIAGNOSIS — Z20822 Contact with and (suspected) exposure to covid-19: Secondary | ICD-10-CM | POA: Diagnosis present

## 2020-02-28 DIAGNOSIS — E871 Hypo-osmolality and hyponatremia: Secondary | ICD-10-CM | POA: Diagnosis present

## 2020-02-28 DIAGNOSIS — E876 Hypokalemia: Secondary | ICD-10-CM | POA: Diagnosis not present

## 2020-02-28 DIAGNOSIS — I499 Cardiac arrhythmia, unspecified: Secondary | ICD-10-CM | POA: Diagnosis not present

## 2020-02-28 DIAGNOSIS — I361 Nonrheumatic tricuspid (valve) insufficiency: Secondary | ICD-10-CM | POA: Diagnosis not present

## 2020-02-28 DIAGNOSIS — K8000 Calculus of gallbladder with acute cholecystitis without obstruction: Principal | ICD-10-CM | POA: Diagnosis present

## 2020-02-28 DIAGNOSIS — Z8261 Family history of arthritis: Secondary | ICD-10-CM

## 2020-02-28 DIAGNOSIS — R0689 Other abnormalities of breathing: Secondary | ICD-10-CM | POA: Diagnosis not present

## 2020-02-28 DIAGNOSIS — K4090 Unilateral inguinal hernia, without obstruction or gangrene, not specified as recurrent: Secondary | ICD-10-CM | POA: Diagnosis not present

## 2020-02-28 DIAGNOSIS — J9811 Atelectasis: Secondary | ICD-10-CM | POA: Diagnosis not present

## 2020-02-28 DIAGNOSIS — Z6841 Body Mass Index (BMI) 40.0 and over, adult: Secondary | ICD-10-CM

## 2020-02-28 DIAGNOSIS — K567 Ileus, unspecified: Secondary | ICD-10-CM

## 2020-02-28 DIAGNOSIS — Z833 Family history of diabetes mellitus: Secondary | ICD-10-CM

## 2020-02-28 DIAGNOSIS — K82A1 Gangrene of gallbladder in cholecystitis: Secondary | ICD-10-CM | POA: Diagnosis present

## 2020-02-28 DIAGNOSIS — I712 Thoracic aortic aneurysm, without rupture: Secondary | ICD-10-CM | POA: Diagnosis present

## 2020-02-28 DIAGNOSIS — K429 Umbilical hernia without obstruction or gangrene: Secondary | ICD-10-CM | POA: Diagnosis not present

## 2020-02-28 DIAGNOSIS — Z01810 Encounter for preprocedural cardiovascular examination: Secondary | ICD-10-CM | POA: Diagnosis not present

## 2020-02-28 DIAGNOSIS — K819 Cholecystitis, unspecified: Secondary | ICD-10-CM

## 2020-02-28 DIAGNOSIS — E782 Mixed hyperlipidemia: Secondary | ICD-10-CM | POA: Diagnosis not present

## 2020-02-28 DIAGNOSIS — A084 Viral intestinal infection, unspecified: Secondary | ICD-10-CM

## 2020-02-28 DIAGNOSIS — R112 Nausea with vomiting, unspecified: Secondary | ICD-10-CM | POA: Diagnosis not present

## 2020-02-28 DIAGNOSIS — I4819 Other persistent atrial fibrillation: Secondary | ICD-10-CM | POA: Diagnosis present

## 2020-02-28 DIAGNOSIS — K8020 Calculus of gallbladder without cholecystitis without obstruction: Secondary | ICD-10-CM | POA: Diagnosis not present

## 2020-02-28 LAB — CBC
HCT: 43.9 % (ref 39.0–52.0)
Hemoglobin: 14.6 g/dL (ref 13.0–17.0)
MCH: 29.3 pg (ref 26.0–34.0)
MCHC: 33.3 g/dL (ref 30.0–36.0)
MCV: 88 fL (ref 80.0–100.0)
Platelets: 375 10*3/uL (ref 150–400)
RBC: 4.99 MIL/uL (ref 4.22–5.81)
RDW: 12.4 % (ref 11.5–15.5)
WBC: 11.9 10*3/uL — ABNORMAL HIGH (ref 4.0–10.5)
nRBC: 0 % (ref 0.0–0.2)

## 2020-02-28 LAB — SARS CORONAVIRUS 2 BY RT PCR (HOSPITAL ORDER, PERFORMED IN ~~LOC~~ HOSPITAL LAB): SARS Coronavirus 2: NEGATIVE

## 2020-02-28 LAB — COMPREHENSIVE METABOLIC PANEL
ALT: 28 U/L (ref 0–44)
AST: 28 U/L (ref 15–41)
Albumin: 3.4 g/dL — ABNORMAL LOW (ref 3.5–5.0)
Alkaline Phosphatase: 61 U/L (ref 38–126)
Anion gap: 14 (ref 5–15)
BUN: 77 mg/dL — ABNORMAL HIGH (ref 8–23)
CO2: 22 mmol/L (ref 22–32)
Calcium: 9.1 mg/dL (ref 8.9–10.3)
Chloride: 96 mmol/L — ABNORMAL LOW (ref 98–111)
Creatinine, Ser: 2.13 mg/dL — ABNORMAL HIGH (ref 0.61–1.24)
GFR calc Af Amer: 34 mL/min — ABNORMAL LOW (ref >60)
GFR calc non Af Amer: 29 mL/min — ABNORMAL LOW (ref >60)
Glucose, Bld: 159 mg/dL — ABNORMAL HIGH (ref 70–99)
Potassium: 3.3 mmol/L — ABNORMAL LOW (ref 3.5–5.1)
Sodium: 132 mmol/L — ABNORMAL LOW (ref 135–145)
Total Bilirubin: 0.9 mg/dL (ref 0.3–1.2)
Total Protein: 7.3 g/dL (ref 6.5–8.1)

## 2020-02-28 LAB — URINALYSIS, ROUTINE W REFLEX MICROSCOPIC
Bilirubin Urine: NEGATIVE
Glucose, UA: NEGATIVE mg/dL
Hgb urine dipstick: NEGATIVE
Ketones, ur: NEGATIVE mg/dL
Leukocytes,Ua: NEGATIVE
Nitrite: NEGATIVE
Protein, ur: NEGATIVE mg/dL
Specific Gravity, Urine: 1.019 (ref 1.005–1.030)
pH: 5 (ref 5.0–8.0)

## 2020-02-28 LAB — PHOSPHORUS: Phosphorus: 3.8 mg/dL (ref 2.5–4.6)

## 2020-02-28 LAB — TROPONIN I (HIGH SENSITIVITY): Troponin I (High Sensitivity): 7 ng/L (ref <18)

## 2020-02-28 LAB — MAGNESIUM: Magnesium: 2.2 mg/dL (ref 1.7–2.4)

## 2020-02-28 LAB — LACTIC ACID, PLASMA: Lactic Acid, Venous: 1.2 mmol/L (ref 0.5–1.9)

## 2020-02-28 LAB — LIPASE, BLOOD: Lipase: 17 U/L (ref 11–51)

## 2020-02-28 MED ORDER — SODIUM CHLORIDE 0.9 % IV BOLUS
1000.0000 mL | Freq: Once | INTRAVENOUS | Status: AC
  Administered 2020-02-28: 1000 mL via INTRAVENOUS

## 2020-02-28 MED ORDER — PIPERACILLIN-TAZOBACTAM 3.375 G IVPB 30 MIN
3.3750 g | Freq: Once | INTRAVENOUS | Status: AC
  Administered 2020-02-28: 3.375 g via INTRAVENOUS
  Filled 2020-02-28: qty 50

## 2020-02-28 MED ORDER — POTASSIUM CHLORIDE IN NACL 20-0.9 MEQ/L-% IV SOLN
INTRAVENOUS | Status: AC
  Filled 2020-02-28: qty 1000

## 2020-02-28 NOTE — H&P (Signed)
History and Physical    Ryan Garner XAJ:[id number] DOB: 17-Jun-1946 DOA: 02/28/2020  PCP: [name], [name], MD   Patient coming from: Home.  I have personally briefly reviewed patient's old medical records in [place]  Chief Complaint: NVD and abdominal pain.  HPI: Ryan Garner is a 74 y.o. male with medical history significant of hyperlipidemia, hypertension, type 2 diabetes, class III obesity with a BMI of 45.61 kg/m who is coming to the emergency department due to abdominal pain that started on Wednesday evening followed by 3 episodes of emesis that night, then several episodes of diarrhea since Thursday associated with fatigue, decreased appetite and abdominal pain.  He has been drinking fluids trying to stay hydrated as he could, but decided to come to the ED since his symptoms have not improved.  Not sick contacts or travel history.  Denies melena or hematochezia.  No dysuria, frequency or hematuria.  He denies fever, chills, sore throat, rhinorrhea, productive cough, wheezing or hemoptysis Denies chest pain, lightheadedness, dyspnea, palpitations, diaphoresis, PND, orthopnea or recent pitting edema of the lower extremities.  No polyuria, polydipsia, polyphagia or blurred vision.  ED Course: Initial vital signs were temperature 98 F, pulse 61, respiration 18, blood pressure 122/73 mmHg O2 sat 98% on room air.  CBC showed a white count 11.9, hemoglobin 14.6 g/dL and platelets 375.  His urinalysis showed cloudy appearance, [place] otherwise normal.  Lactic acid was normal.  Sodium 132, potassium 3.3, chloride 96 and CO2 22 mmol/L.  Glucose 159, BUN 77 and creatinine 2.13 mg/dL.  His LFTs were within normal limits except for an albumin of 3.4 g/dL.  Imaging: Chest radiograph shows linear basilar atelectasis.  Borderline cardiomegaly.  CT abdomen/pelvis showed distended gallbladder with suspicions for acute cholecystitis.  Please see images and full radiology report for further  detail.  Review of Systems: As per HPI otherwise all other systems reviewed and are negative.  Past Medical History:  Diagnosis Date  . Hyperlipidemia   . Hypertension   . Metabolic syndrome   . Metabolic syndrome   . Prediabetes    Past Surgical History:  Procedure Laterality Date  . APPENDECTOMY    . Eyelid Surgery Bilateral   . HERNIA REPAIR    . knee tendons repair    . ROTATOR CUFF REPAIR Bilateral   . TONSILLECTOMY     Social History  reports that he quit smoking about 60 years ago. His smoking use included cigarettes. He has never used smokeless tobacco. He reports that he does not drink alcohol and does not use drugs.  Allergies  Allergen Reactions  . Rofecoxib Other (See Comments)    (Vioxx)    Family History  Problem Relation Age of Onset  . Cancer Mother        lungs  . Coronary artery disease Mother   . Diabetes Brother   . Heart attack Brother   . Diabetes Maternal Grandmother   . Arthritis Father    Prior to Admission medications   Medication Sig Start Date End Date Taking? Authorizing Provider  albuterol (VENTOLIN HFA) 108 (90 Base) MCG/ACT inhaler Inhale 2 puffs into the lungs every 6 (six) hours as needed for wheezing or shortness of breath. 07/31/19  Yes Loman Brooklyn, FNP  fluticasone (FLONASE) 50 MCG/ACT nasal spray Place 2 sprays into both nostrils daily. Patient taking differently: Place 2 sprays into both nostrils daily as needed for allergies.  07/19/16  Yes Eustaquio Maize, MD  lisinopril-hydrochlorothiazide (ZESTORETIC) 10-12.5 MG  tablet Take 1 tablet by mouth daily. (Needs to be seen before next refill) 10/12/19  Yes [name], [name], MD  Multiple Vitamin (MULTIVITAMIN WITH MINERALS) TABS tablet Take 1 tablet by mouth daily.   Yes [provider]  ondansetron (ZOFRAN ODT) 4 MG disintegrating tablet Take 1 tablet (4 mg total) by mouth every 8 (eight) hours as needed for nausea or vomiting. 02/25/20  Yes [name], [name], MD    Physical Exam: Vitals:   02/28/20 1436 02/28/20 2230 02/28/20 2231  BP: 122/73 121/62   Pulse: 61  63  Resp: 18    Temp: 98 F (36.7 C)    TempSrc: Oral    SpO2: 98%  95%  Weight: 136.1 kg    Height: 5\' 8"  (1.727 m)     Constitutional: Looks acutely ill. Eyes: PERRL, lids and conjunctivae injected. ENMT: Mucous membranes are dry. Posterior pharynx clear of any exudate or lesions. Neck: normal, supple, no masses, no thyromegaly Respiratory: Decreased breath sounds on bases, otherwise clear to auscultation bilaterally, no wheezing, no crackles. Normal respiratory effort. No accessory muscle use.  Cardiovascular: Irregularly irregular in the 110s and 120s, no murmurs / rubs / gallops. No extremity edema. 2+ pedal pulses. No carotid bruits.  Abdomen: Obese.  BS positive.  Soft, mild epigastric/RUQ tenderness, no guarding or rebound masses palpated. No hepatosplenomegaly Musculoskeletal: no clubbing / cyanosis. Good ROM, no contractures. Normal muscle tone.  Skin: no rashes, lesions, ulcers on very limited dermatological examination. Neurologic: CN 2-12 grossly intact. Sensation intact, DTR normal. Strength 5/5 in all 4.  Psychiatric: Normal judgment and insight. Alert and oriented x 3. Normal mood.   Labs on Admission: I have personally reviewed following labs and imaging studies  CBC: Recent Labs  Lab 02/26/20 1643 02/28/20 1545  WBC 29.5* 11.9*  NEUTROABS 26.5*  --   HGB 15.8 14.6  HCT 45.8 43.9  MCV 86 88.0  PLT 347 194    Basic Metabolic Panel: Recent Labs  Lab 02/28/20 1545  NA 132*  K 3.3*  CL 96*  CO2 22  GLUCOSE 159*  BUN 77*  CREATININE 2.13*  CALCIUM 9.1    GFR: Estimated Creatinine Clearance: 39.9 mL/min (A) (by C-G formula based on SCr of 2.13 mg/dL (H)).  Liver Function Tests: Recent Labs  Lab 02/28/20 1545  AST 28  ALT 28  ALKPHOS 61  BILITOT 0.9  PROT 7.3  ALBUMIN 3.4*   Urine analysis:    Component Value Date/Time   COLORURINE  YELLOW 02/28/2020 1440   APPEARANCEUR CLOUDY (A) 02/28/2020 1440   LABSPEC 1.019 02/28/2020 1440   PHURINE 5.0 02/28/2020 1440   GLUCOSEU NEGATIVE 02/28/2020 1440   HGBUR NEGATIVE 02/28/2020 1440   [place] 02/28/2020 1440   BILIRUBINUR neg 08/27/2014 0841   KETONESUR NEGATIVE 02/28/2020 1440   PROTEINUR NEGATIVE 02/28/2020 1440   UROBILINOGEN negative 08/27/2014 0841   NITRITE NEGATIVE 02/28/2020 1440   LEUKOCYTESUR NEGATIVE 02/28/2020 1440   Radiological Exams on Admission: CT ABDOMEN PELVIS WO CONTRAST  Result Date: 02/28/2020 CLINICAL DATA:  Nausea, vomiting.  Abdominal pain. EXAM: CT ABDOMEN AND PELVIS WITHOUT CONTRAST TECHNIQUE: Multidetector CT imaging of the abdomen and pelvis was performed following the standard protocol without IV contrast. COMPARISON:  None. FINDINGS: Lower chest: Coronary artery calcifications. Scarring in the lung bases. No effusions. Hepatobiliary: Gallbladder is distended with surrounding inflammation most compatible with cholecystitis. Scattered calcifications in the liver. Pancreas: No focal abnormality or ductal dilatation. Spleen: No focal abnormality.  Normal size.  Adrenals/Urinary Tract: No adrenal abnormality. No focal renal abnormality. No stones or hydronephrosis. Urinary bladder is unremarkable. Stomach/Bowel: Mildly prominent small bowel loops in the pelvis containing fluid and air-fluid levels. Fluid throughout the colon. Distal small bowel loops are decompressed. This could reflect focal ileus or distal small bowel obstruction. Vascular/Lymphatic: Aortic atherosclerosis. No evidence of aneurysm or adenopathy. Reproductive: No visible focal abnormality. Other: No free fluid or free air. Umbilical and left periumbilical hernias noted containing fat. There is stranding within the fat suggest the possibility of incarceration. Left inguinal hernia contains fat. No free fluid or free air. Musculoskeletal: No acute bony abnormality. IMPRESSION:  Distended gallbladder with surrounding inflammation most compatible with acute cholecystitis. No ductal dilatation seen. This could be further evaluated with right upper quadrant ultrasound if felt clinically indicated. Prominent small bowel loops with air-fluid levels. Distal small bowel loops are decompressed. This could reflect focal ileus or distal small bowel obstruction. Umbilical and left periumbilical hernias contain fat with stranding which could reflect incarceration. Left inguinal hernia contains fat. Electronically Signed   By: Rolm Baptise M.D.   On: 02/28/2020 22:19   DG Chest Portable 1 View  Result Date: 02/28/2020 CLINICAL DATA:  Nausea, vomiting EXAM: PORTABLE CHEST 1 VIEW COMPARISON:  10/15/2013 FINDINGS: Linear bibasilar atelectasis. Heart is borderline in size. No effusions or acute bony abnormality. IMPRESSION: Bibasilar atelectasis. Electronically Signed   By: Rolm Baptise M.D.   On: 02/28/2020 21:52    EKG: Independently reviewed.  Vent. rate 118 BPM PR interval * ms QRS duration 88 ms QT/QTc 328/459 ms P-R-T axes * -17 27 Atrial fibrillation with rapid ventricular response with premature ventricular or aberrantly conducted complexes Possible Lateral infarct , age undetermined Abnormal ECG  Assessment/Plan Principal Problem:   Acute cholecystitis Admit to telemetry/inpatient. Keep n.p.o. Continue IV fluids. Analgesics as needed. Antiemetics as needed. Zosyn 3.375 g every 8 hours. Check RUQ [place] in the a.m. Consult general surgery.  Active Problems:   New onset atrial fibrillation with RVR ([place]) Found incidentally as the patient came in for GI complaints. RVR resolved with metoprolol, electrolyte replacement and IVF. Started on heparin infusion. Check echocardiogram. Consult cardiology.    AKI (acute kidney injury) ([place])  Hold lisinopril/HCT. Continue IVF. Follow renal function electrolytes.    Hypokalemia Replacing. Follow-up potassium level.     Hyponatremia Secondary to GI losses. Continue IVF Follow-up sodium level.    Hypertension Hold ACE inhibitor. Hold hydrochlorothiazide. Monitor blood pressure. Will need bed hour calcium channel blocker.    Type 2 diabetes mellitus ([facility]) Currently NPO. CBG monitoring every 6 hours.    Hyperlipidemia Not on medical therapy. Follow-up with PCP.    Morbid obesity ([place]) Needs lifestyle modifications. Follow-up with PCP.    DVT prophylaxis: On heparin infusion. Code Status:   Full code. Family Communication: Disposition Plan:   Patient is from:  Home.  Anticipated DC to:  Home.  Anticipated DC date:  03/02/2020.  Anticipated DC barriers: Clinical status.  Work-up. Consults called:  Routine general surgery and cardiology consult. Admission status:  Inpatient/telemetry.  Severity of Illness:  Ryan Milan MD [facility]  How to contact the [facility] Attending or Consulting provider [place] or covering provider during after hours [place], for this patient?   1. Check the care team in [facility] and look for a) attending/consulting [facility] provider listed and b) the [facility] team listed 2. Log into [email or web address] and use [place] universal password to access. If you do not have  the password, please contact the hospital operator. 3. Locate the [facility] provider you are looking for under [facility] and page to a number that you can be directly reached. 4. If you still have difficulty reaching the provider, please page the [facility] (Director on Call) for the Hospitalists listed on amion for assistance.  02/28/2020, 11:06 PM   This document was prepared and Dragon voice recognition software may contain some unintended transcription errors.

## 2020-02-28 NOTE — ED Notes (Signed)
Pt to ct 
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
## 2020-02-28 NOTE — ED Triage Notes (Signed)
Pt to er via [facility], per ems pt is here for nausea and vomiting since Wednesday.  States that he was prescribed zofran on Thursday states that he hasn't vomited since then, but is still having abd pain.  States that it is a general tenderness.  Pt states that he called 911 today because he has diarrhea and is getting weaker.

## 2020-02-29 ENCOUNTER — Inpatient Hospital Stay (HOSPITAL_COMMUNITY): Payer: Medicare Other

## 2020-02-29 DIAGNOSIS — I350 Nonrheumatic aortic (valve) stenosis: Secondary | ICD-10-CM

## 2020-02-29 DIAGNOSIS — E876 Hypokalemia: Secondary | ICD-10-CM

## 2020-02-29 DIAGNOSIS — E782 Mixed hyperlipidemia: Secondary | ICD-10-CM

## 2020-02-29 DIAGNOSIS — I10 Essential (primary) hypertension: Secondary | ICD-10-CM

## 2020-02-29 DIAGNOSIS — I361 Nonrheumatic tricuspid (valve) insufficiency: Secondary | ICD-10-CM

## 2020-02-29 DIAGNOSIS — E119 Type 2 diabetes mellitus without complications: Secondary | ICD-10-CM

## 2020-02-29 DIAGNOSIS — Z01810 Encounter for preprocedural cardiovascular examination: Secondary | ICD-10-CM

## 2020-02-29 DIAGNOSIS — I4819 Other persistent atrial fibrillation: Secondary | ICD-10-CM | POA: Diagnosis present

## 2020-02-29 DIAGNOSIS — I4891 Unspecified atrial fibrillation: Secondary | ICD-10-CM | POA: Diagnosis present

## 2020-02-29 DIAGNOSIS — I340 Nonrheumatic mitral (valve) insufficiency: Secondary | ICD-10-CM

## 2020-02-29 DIAGNOSIS — N179 Acute kidney failure, unspecified: Secondary | ICD-10-CM

## 2020-02-29 DIAGNOSIS — K810 Acute cholecystitis: Secondary | ICD-10-CM

## 2020-02-29 LAB — COMPREHENSIVE METABOLIC PANEL
ALT: 23 U/L (ref 0–44)
AST: 17 U/L (ref 15–41)
Albumin: 2.8 g/dL — ABNORMAL LOW (ref 3.5–5.0)
Alkaline Phosphatase: 52 U/L (ref 38–126)
Anion gap: 11 (ref 5–15)
BUN: 67 mg/dL — ABNORMAL HIGH (ref 8–23)
CO2: 21 mmol/L — ABNORMAL LOW (ref 22–32)
Calcium: 8.2 mg/dL — ABNORMAL LOW (ref 8.9–10.3)
Chloride: 104 mmol/L (ref 98–111)
Creatinine, Ser: 1.6 mg/dL — ABNORMAL HIGH (ref 0.61–1.24)
GFR calc Af Amer: 48 mL/min — ABNORMAL LOW (ref >60)
GFR calc non Af Amer: 41 mL/min — ABNORMAL LOW (ref >60)
Glucose, Bld: 149 mg/dL — ABNORMAL HIGH (ref 70–99)
Potassium: 3.3 mmol/L — ABNORMAL LOW (ref 3.5–5.1)
Sodium: 136 mmol/L (ref 135–145)
Total Bilirubin: 0.9 mg/dL (ref 0.3–1.2)
Total Protein: 6.1 g/dL — ABNORMAL LOW (ref 6.5–8.1)

## 2020-02-29 LAB — BASIC METABOLIC PANEL
Anion gap: 9 (ref 5–15)
BUN: 56 mg/dL — ABNORMAL HIGH (ref 8–23)
CO2: 22 mmol/L (ref 22–32)
Calcium: 8.2 mg/dL — ABNORMAL LOW (ref 8.9–10.3)
Chloride: 106 mmol/L (ref 98–111)
Creatinine, Ser: 1.45 mg/dL — ABNORMAL HIGH (ref 0.61–1.24)
GFR calc Af Amer: 54 mL/min — ABNORMAL LOW (ref >60)
GFR calc non Af Amer: 46 mL/min — ABNORMAL LOW (ref >60)
Glucose, Bld: 134 mg/dL — ABNORMAL HIGH (ref 70–99)
Potassium: 3.5 mmol/L (ref 3.5–5.1)
Sodium: 137 mmol/L (ref 135–145)

## 2020-02-29 LAB — ECHOCARDIOGRAM COMPLETE
Area-P 1/2: 4.54 cm2
Height: 68 in
MV M vel: 5.3 m/s
MV Peak grad: 112.4 mmHg
S' Lateral: 3.99 cm
Weight: 4800 oz

## 2020-02-29 LAB — CBC WITH DIFFERENTIAL/PLATELET
Abs Immature Granulocytes: 0.02 10*3/uL (ref 0.00–0.07)
Basophils Absolute: 0 10*3/uL (ref 0.0–0.1)
Basophils Relative: 0 %
Eosinophils Absolute: 0.1 10*3/uL (ref 0.0–0.5)
Eosinophils Relative: 1 %
HCT: 39.8 % (ref 39.0–52.0)
Hemoglobin: 13 g/dL (ref 13.0–17.0)
Immature Granulocytes: 0 %
Lymphocytes Relative: 12 %
Lymphs Abs: 0.9 10*3/uL (ref 0.7–4.0)
MCH: 29 pg (ref 26.0–34.0)
MCHC: 32.7 g/dL (ref 30.0–36.0)
MCV: 88.6 fL (ref 80.0–100.0)
Monocytes Absolute: 1 10*3/uL (ref 0.1–1.0)
Monocytes Relative: 12 %
Neutro Abs: 5.9 10*3/uL (ref 1.7–7.7)
Neutrophils Relative %: 75 %
Platelets: 340 10*3/uL (ref 150–400)
RBC: 4.49 MIL/uL (ref 4.22–5.81)
RDW: 12.5 % (ref 11.5–15.5)
WBC: 7.9 10*3/uL (ref 4.0–10.5)
nRBC: 0 % (ref 0.0–0.2)

## 2020-02-29 LAB — GLUCOSE, CAPILLARY
Glucose-Capillary: 134 mg/dL — ABNORMAL HIGH (ref 70–99)
Glucose-Capillary: 146 mg/dL — ABNORMAL HIGH (ref 70–99)

## 2020-02-29 LAB — HEPARIN LEVEL (UNFRACTIONATED)
Heparin Unfractionated: 0.2 IU/mL — ABNORMAL LOW (ref 0.30–0.70)
Heparin Unfractionated: 0.12 IU/mL — ABNORMAL LOW (ref 0.30–0.70)

## 2020-02-29 LAB — TSH: TSH: 0.599 u[IU]/mL (ref 0.350–4.500)

## 2020-02-29 LAB — CBG MONITORING, ED: Glucose-Capillary: 147 mg/dL — ABNORMAL HIGH (ref 70–99)

## 2020-02-29 MED ORDER — HEPARIN BOLUS VIA INFUSION
4000.0000 [IU] | Freq: Once | INTRAVENOUS | Status: AC
  Administered 2020-02-29: 4000 [IU] via INTRAVENOUS

## 2020-02-29 MED ORDER — ACETAMINOPHEN 650 MG RE SUPP: 650.0000 mg | Freq: Four times a day (QID) | RECTAL | Status: DC | PRN

## 2020-02-29 MED ORDER — ONDANSETRON HCL 4 MG/2ML IJ SOLN: 4.0000 mg | Freq: Four times a day (QID) | INTRAMUSCULAR | Status: DC | PRN

## 2020-02-29 MED ORDER — METOPROLOL TARTRATE 25 MG PO TABS
12.5000 mg | ORAL_TABLET | Freq: Two times a day (BID) | ORAL | Status: DC
  Administered 2020-02-29 – 2020-03-03 (×5): 12.5 mg via ORAL
  Filled 2020-02-29 (×5): qty 1

## 2020-02-29 MED ORDER — PIPERACILLIN-TAZOBACTAM 3.375 G IVPB
3.3750 g | Freq: Three times a day (TID) | INTRAVENOUS | Status: DC
  Administered 2020-02-29 – 2020-03-03 (×10): 3.375 g via INTRAVENOUS
  Filled 2020-02-29 (×10): qty 50

## 2020-02-29 MED ORDER — POTASSIUM CHLORIDE IN NACL 20-0.9 MEQ/L-% IV SOLN
INTRAVENOUS | Status: AC
  Filled 2020-02-29: qty 1000

## 2020-02-29 MED ORDER — POTASSIUM CHLORIDE IN NACL 20-0.9 MEQ/L-% IV SOLN: INTRAVENOUS | Status: DC

## 2020-02-29 MED ORDER — ACETAMINOPHEN 325 MG PO TABS: 650.0000 mg | ORAL_TABLET | Freq: Four times a day (QID) | ORAL | Status: DC | PRN

## 2020-02-29 MED ORDER — METOPROLOL TARTRATE 5 MG/5ML IV SOLN: 2.5000 mg | INTRAVENOUS | Status: AC

## 2020-02-29 MED ORDER — ONDANSETRON HCL 4 MG PO TABS: 4.0000 mg | ORAL_TABLET | Freq: Four times a day (QID) | ORAL | Status: DC | PRN

## 2020-02-29 MED ORDER — HEPARIN BOLUS VIA INFUSION
1500.0000 [IU] | Freq: Once | INTRAVENOUS | Status: AC
  Administered 2020-02-29: 1500 [IU] via INTRAVENOUS

## 2020-02-29 MED ORDER — HEPARIN BOLUS VIA INFUSION
3000.0000 [IU] | Freq: Once | INTRAVENOUS | Status: AC
  Administered 2020-02-29: 3000 [IU] via INTRAVENOUS
  Filled 2020-02-29: qty 3000

## 2020-02-29 MED ORDER — FAMOTIDINE IN NACL 20-0.9 MG/50ML-% IV SOLN
20.0000 mg | Freq: Once | INTRAVENOUS | Status: AC
  Administered 2020-02-29: 20 mg via INTRAVENOUS
  Filled 2020-02-29: qty 50

## 2020-02-29 MED ORDER — HEPARIN (PORCINE) 25000 UT/250ML-% IV SOLN
1900.0000 [IU]/h | INTRAVENOUS | Status: AC
  Administered 2020-02-29: 1600 [IU]/h via INTRAVENOUS
  Administered 2020-02-29: 1400 [IU]/h via INTRAVENOUS
  Filled 2020-02-29 (×4): qty 250

## 2020-02-29 MED ORDER — PIPERACILLIN-TAZOBACTAM 3.375 G IVPB 30 MIN: 3.3750 g | Freq: Three times a day (TID) | INTRAVENOUS | Status: DC

## 2020-02-29 NOTE — Progress Notes (Signed)
ANTICOAGULATION CONSULT NOTE -   Pharmacy Consult for heparin Indication: atrial fibrillation  Allergies  Allergen Reactions  . Rofecoxib Other (See Comments)    (Vioxx)    Patient Measurements: Height: 5\' 8"  (172.7 cm) Weight: 136.1 kg (300 lb) IBW/kg (Calculated) : 68.4 Heparin Dosing Weight: 100.7 kg  Vital Signs: BP: 116/83 (08/09 0800) Pulse Rate: 113 (08/09 0800)  Labs: Recent Labs    02/26/20 1643 02/28/20 1545 02/28/20 2237 02/29/20 0413 02/29/20 0819  HGB 15.8 14.6  --  13.0  --   HCT 45.8 43.9  --  39.8  --   PLT 347 375  --  340  --   HEPARINUNFRC  --   --   --   --  0.20*  CREATININE  --  2.13*  --  1.60*  --   TROPONINIHS  --   --  7  --   --     Estimated Creatinine Clearance: 53.1 mL/min (A) (by C-G formula based on SCr of 1.6 mg/dL (H)).   Medical History: Past Medical History:  Diagnosis Date  . Hyperlipidemia   . Hypertension   . Metabolic syndrome   . Metabolic syndrome   . Prediabetes     Medications:  See medication history  Assessment: 74 yo man to start heparin for afib.  He was not on anticoagulation PTA.  Heparin level 0.20- subtherapeutic  Goal of Therapy:  Heparin level 0.3-0.7 units/ml Monitor platelets by anticoagulation protocol: Yes   Plan:  Rebolus 1500 units x 1 Increase heparin infusion 1600 units/hr Check heparin level ~6-8 hours after start Monitor for bleeding complications  Margot Ables, PharmD Clinical Pharmacist 02/29/2020 8:57 AM

## 2020-02-29 NOTE — Consult Note (Signed)
Reason for Consult: Right upper quadrant abdominal pain and nausea Referring Physician: Dr. Marylin Crosby Fluharty is an 74 y.o. male.  HPI: Patient is a 74 year old white male who presented to the emergency room with worsening right upper quadrant abdominal pain and nausea.  He states this is his first episode.  He was found on ultrasound the gallbladder to have cholelithiasis with a positive Murphy sign, consistent with acute cholecystitis.  His common bile duct is within normal limits.  He denies any fatty food intolerance.  He denies any fever, chills, jaundice.  His pain is currently easing with pain medication.  He is hungry.  Past Medical History:  Diagnosis Date  . Hyperlipidemia   . Hypertension   . Metabolic syndrome   . Metabolic syndrome   . Prediabetes     Past Surgical History:  Procedure Laterality Date  . APPENDECTOMY    . Eyelid Surgery Bilateral   . HERNIA REPAIR    . knee tendons repair    . ROTATOR CUFF REPAIR Bilateral   . TONSILLECTOMY      Family History  Problem Relation Age of Onset  . Cancer Mother        lungs  . Coronary artery disease Mother   . Diabetes Brother   . Heart attack Brother   . Diabetes Maternal Grandmother   . Arthritis Father     Social History:  reports that he quit smoking about 60 years ago. His smoking use included cigarettes. He has never used smokeless tobacco. He reports that he does not drink alcohol and does not use drugs.  Allergies:  Allergies  Allergen Reactions  . Rofecoxib Other (See Comments)    (Vioxx)    Medications: I have reviewed the patient's current medications.  Results for orders placed or performed during the hospital encounter of 02/28/20 (from the past 48 hour(s))  Urinalysis, Routine w reflex microscopic Urine, Clean Catch     Status: Abnormal   Collection Time: 02/28/20  2:40 PM  Result Value Ref Range   Color, Urine YELLOW YELLOW   APPearance CLOUDY (A) CLEAR   Specific Gravity, Urine 1.019  1.005 - 1.030   pH 5.0 5.0 - 8.0   Glucose, UA NEGATIVE NEGATIVE mg/dL   Hgb urine dipstick NEGATIVE NEGATIVE   Bilirubin Urine NEGATIVE NEGATIVE   Ketones, ur NEGATIVE NEGATIVE mg/dL   Protein, ur NEGATIVE NEGATIVE mg/dL   Nitrite NEGATIVE NEGATIVE   Leukocytes,Ua NEGATIVE NEGATIVE    Comment: Performed at [facility], [address], [place], [place] [address]  TSH     Status: None   Collection Time: 02/28/20  3:35 PM  Result Value Ref Range   TSH 0.599 0.350 - 4.500 uIU/mL    Comment: Performed by a 3rd Generation assay with a functional sensitivity of <=0.01 uIU/mL. Performed at [facility], [address], [place], [place] [address]   Lipase, blood     Status: None   Collection Time: 02/28/20  3:45 PM  Result Value Ref Range   Lipase 17 11 - 51 U/L    Comment: Performed at [facility], [address] [place], [place] [address]  Comprehensive metabolic panel     Status: Abnormal   Collection Time: 02/28/20  3:45 PM  Result Value Ref Range   Sodium 132 (L) 135 - 145 mmol/L   Potassium 3.3 (L) 3.5 - 5.1 mmol/L   Chloride 96 (L) 98 - 111 mmol/L   CO2 22 22 - 32 mmol/L  Glucose, Bld 159 (H) 70 - 99 mg/dL    Comment: Glucose reference range applies only to samples taken after fasting for at least 8 hours.   BUN 77 (H) 8 - 23 mg/dL   Creatinine, Ser 2.13 (H) 0.61 - 1.24 mg/dL   Calcium 9.1 8.9 - 10.3 mg/dL   Total Protein 7.3 6.5 - 8.1 g/dL   Albumin 3.4 (L) 3.5 - 5.0 g/dL   AST 28 15 - 41 U/L   ALT 28 0 - 44 U/L   Alkaline Phosphatase 61 38 - 126 U/L   Total Bilirubin 0.9 0.3 - 1.2 mg/dL   GFR calc non Af Amer 29 (L) >60 mL/min   GFR calc Af Amer 34 (L) >60 mL/min   Anion gap 14 5 - 15    Comment: Performed at [facility], [address] [place], [place] [address]  CBC     Status: Abnormal   Collection Time: 02/28/20  3:45 PM  Result Value Ref Range   WBC 11.9 (H) 4.0 - 10.5 K/uL   RBC 4.99 4.22 - 5.81 MIL/uL   Hemoglobin 14.6 13.0 - 17.0 g/dL   HCT 43.9 39 - 52 %    MCV 88.0 80.0 - 100.0 fL   MCH 29.3 26.0 - 34.0 pg   MCHC 33.3 30.0 - 36.0 g/dL   RDW 12.4 11.5 - 15.5 %   Platelets 375 150 - 400 K/uL   nRBC 0.0 0.0 - 0.2 %    Comment: Performed at [facility], [address] [place], [place] [address]  SARS Coronavirus 2 by RT PCR (hospital order, performed in [facility] hospital lab) Nasopharyngeal Nasopharyngeal Swab     Status: None   Collection Time: 02/28/20  9:38 PM   Specimen: Nasopharyngeal Swab  Result Value Ref Range   SARS Coronavirus 2 NEGATIVE NEGATIVE    Comment: (NOTE) SARS-CoV-2 target nucleic acids are NOT DETECTED.  The SARS-CoV-2 RNA is generally detectable in upper and lower respiratory specimens during the acute phase of infection. The lowest concentration of SARS-CoV-2 viral copies this assay can detect is 250 copies / mL. A negative result does not preclude SARS-CoV-2 infection and should not be used as the sole basis for treatment or other patient management decisions.  A negative result may occur with improper specimen collection / handling, submission of specimen other than nasopharyngeal swab, presence of viral mutation(s) within the areas targeted by this assay, and inadequate number of viral copies (<250 copies / mL). A negative result must be combined with clinical observations, patient history, and epidemiological information.  Fact Sheet for Patients:   StrictlyIdeas.no  Fact Sheet for Healthcare Providers: BankingDealers.co.za  This test is not yet approved or  cleared by the [place] FDA and has been authorized for detection and/or diagnosis of SARS-CoV-2 by FDA under an Emergency Use Authorization (EUA).  This EUA will remain in effect (meaning this test can be used) for the duration of the COVID-19 declaration under Section 564(b)(1) of the Act, 21 U.S.C. section 360bbb-3(b)(1), unless the authorization is terminated or revoked sooner.  Performed  at [facility], [address], [place], [place] [address]   Troponin I (High Sensitivity)     Status: None   Collection Time: 02/28/20 10:37 PM  Result Value Ref Range   Troponin I (High Sensitivity) 7 <18 ng/L    Comment: (NOTE) Elevated high sensitivity troponin I (hsTnI) values and significant  changes across serial measurements may suggest ACS but many other  chronic and acute conditions are known to elevate hsTnI results.  Refer to the "Links" section for chest pain algorithms and additional  guidance. Performed at [facility], [address], [place], [place] [address]   Lactic acid, plasma     Status: None   Collection Time: 02/28/20 10:37 PM  Result Value Ref Range   Lactic Acid, Venous 1.2 0.5 - 1.9 mmol/L    Comment: Performed at [facility], [address], [place], [place] [address]  Magnesium     Status: None   Collection Time: 02/28/20 10:37 PM  Result Value Ref Range   Magnesium 2.2 1.7 - 2.4 mg/dL    Comment: Performed at [facility], [address], [place], [place] [address]  Phosphorus     Status: None   Collection Time: 02/28/20 10:37 PM  Result Value Ref Range   Phosphorus 3.8 2.5 - 4.6 mg/dL    Comment: Performed at [facility], [address], [place], [place] [address]  CBC WITH DIFFERENTIAL     Status: None   Collection Time: 02/29/20  4:13 AM  Result Value Ref Range   WBC 7.9 4.0 - 10.5 K/uL   RBC 4.49 4.22 - 5.81 MIL/uL   Hemoglobin 13.0 13.0 - 17.0 g/dL   HCT 39.8 39 - 52 %   MCV 88.6 80.0 - 100.0 fL   MCH 29.0 26.0 - 34.0 pg   MCHC 32.7 30.0 - 36.0 g/dL   RDW 12.5 11.5 - 15.5 %   Platelets 340 150 - 400 K/uL   nRBC 0.0 0.0 - 0.2 %   Neutrophils Relative % 75 %   Neutro Abs 5.9 1.7 - 7.7 K/uL   Lymphocytes Relative 12 %   Lymphs Abs 0.9 0.7 - 4.0 K/uL   Monocytes Relative 12 %   Monocytes Absolute 1.0 0 - 1 K/uL   Eosinophils Relative 1 %   Eosinophils Absolute 0.1 0 - 0 K/uL   Basophils Relative 0 %   Basophils Absolute 0.0 0 - 0 K/uL    Immature Granulocytes 0 %   Abs Immature Granulocytes 0.02 0.00 - 0.07 K/uL    Comment: Performed at [facility], [address] [place], [place] [address]  Comprehensive metabolic panel     Status: Abnormal   Collection Time: 02/29/20  4:13 AM  Result Value Ref Range   Sodium 136 135 - 145 mmol/L   Potassium 3.3 (L) 3.5 - 5.1 mmol/L   Chloride 104 98 - 111 mmol/L   CO2 21 (L) 22 - 32 mmol/L   Glucose, Bld 149 (H) 70 - 99 mg/dL    Comment: Glucose reference range applies only to samples taken after fasting for at least 8 hours.   BUN 67 (H) 8 - 23 mg/dL   Creatinine, Ser 1.60 (H) 0.61 - 1.24 mg/dL   Calcium 8.2 (L) 8.9 - 10.3 mg/dL   Total Protein 6.1 (L) 6.5 - 8.1 g/dL   Albumin 2.8 (L) 3.5 - 5.0 g/dL   AST 17 15 - 41 U/L   ALT 23 0 - 44 U/L   Alkaline Phosphatase 52 38 - 126 U/L   Total Bilirubin 0.9 0.3 - 1.2 mg/dL   GFR calc non Af Amer 41 (L) >60 mL/min   GFR calc Af Amer 48 (L) >60 mL/min   Anion gap 11 5 - 15    Comment: Performed at [facility], [address] [place], [place] [address]  Heparin level (unfractionated)     Status:  Abnormal   Collection Time: 02/29/20  8:19 AM  Result Value Ref Range   Heparin Unfractionated 0.20 (L) 0.30 - 0.70 IU/mL    Comment: (NOTE) If heparin results are below expected values, and patient dosage has  been confirmed, suggest follow up testing of antithrombin III levels. Performed at [facility], [address] [place], [place] [address]   CBG monitoring, ED     Status: Abnormal   Collection Time: 02/29/20  8:26 AM  Result Value Ref Range   Glucose-Capillary 147 (H) 70 - 99 mg/dL    Comment: Glucose reference range applies only to samples taken after fasting for at least 8 hours.    CT ABDOMEN PELVIS WO CONTRAST  Result Date: 02/28/2020 CLINICAL DATA:  Nausea, vomiting.  Abdominal pain. EXAM: CT ABDOMEN AND PELVIS WITHOUT CONTRAST TECHNIQUE: Multidetector CT imaging of the abdomen and pelvis was performed following the  standard protocol without IV contrast. COMPARISON:  None. FINDINGS: Lower chest: Coronary artery calcifications. Scarring in the lung bases. No effusions. Hepatobiliary: Gallbladder is distended with surrounding inflammation most compatible with cholecystitis. Scattered calcifications in the liver. Pancreas: No focal abnormality or ductal dilatation. Spleen: No focal abnormality.  Normal size. Adrenals/Urinary Tract: No adrenal abnormality. No focal renal abnormality. No stones or hydronephrosis. Urinary bladder is unremarkable. Stomach/Bowel: Mildly prominent small bowel loops in the pelvis containing fluid and air-fluid levels. Fluid throughout the colon. Distal small bowel loops are decompressed. This could reflect focal ileus or distal small bowel obstruction. Vascular/Lymphatic: Aortic atherosclerosis. No evidence of aneurysm or adenopathy. Reproductive: No visible focal abnormality. Other: No free fluid or free air. Umbilical and left periumbilical hernias noted containing fat. There is stranding within the fat suggest the possibility of incarceration. Left inguinal hernia contains fat. No free fluid or free air. Musculoskeletal: No acute bony abnormality. IMPRESSION: Distended gallbladder with surrounding inflammation most compatible with acute cholecystitis. No ductal dilatation seen. This could be further evaluated with right upper quadrant ultrasound if felt clinically indicated. Prominent small bowel loops with air-fluid levels. Distal small bowel loops are decompressed. This could reflect focal ileus or distal small bowel obstruction. Umbilical and left periumbilical hernias contain fat with stranding which could reflect incarceration. Left inguinal hernia contains fat. Electronically Signed   By: Rolm Baptise M.D.   On: 02/28/2020 22:19   DG Chest Portable 1 View  Result Date: 02/28/2020 CLINICAL DATA:  Nausea, vomiting EXAM: PORTABLE CHEST 1 VIEW COMPARISON:  10/15/2013 FINDINGS: Linear bibasilar  atelectasis. Heart is borderline in size. No effusions or acute bony abnormality. IMPRESSION: Bibasilar atelectasis. Electronically Signed   By: Rolm Baptise M.D.   On: 02/28/2020 21:52   US Abdomen Limited RUQ  Result Date: 02/29/2020 CLINICAL DATA:  Abdominal pain. Nausea and vomiting. Cholelithiasis. EXAM: ULTRASOUND ABDOMEN LIMITED RIGHT UPPER QUADRANT COMPARISON:  None. FINDINGS: Gallbladder: Gallbladder is distended and contains multiple gallstones, largest measuring 1.7 cm. Diffuse gallbladder wall thickening is seen measuring up to 9 mm in thickness. These findings are consistent with acute cholecystitis. Common bile duct: Diameter: 4 mm, within normal limits. Liver: No focal lesion identified. Within normal limits in parenchymal echogenicity. Portal vein is patent on color Doppler imaging with normal direction of blood flow towards the liver. Other: None. IMPRESSION: Cholelithiasis with diffuse gallbladder wall thickening, consistent with acute cholecystitis. No evidence of biliary ductal dilatation. Electronically Signed   By: Marlaine Hind M.D.   On: 02/29/2020 09:22    ROS:  Pertinent items are noted in HPI.  Blood pressure  114/69, pulse 94, temperature 98 F (36.7 C), temperature source Oral, resp. rate 14, height 5\' 8"  (1.727 m), weight 136.1 kg, SpO2 96 %. Physical Exam: Pleasant white male no acute distress Head is normocephalic, atraumatic Eyes are without scleral icterus Lungs clear to auscultation with good breath sounds bilaterally Heart examination reveals an irregularly irregular rate and rhythm Abdomen is rotund and soft.  He is tender in the right upper quadrant to deep palpation.  No rigidity is noted.  Labs and ultrasound reports reviewed  Assessment/Plan: Impression: Acute cholecystitis, cholelithiasis.  New onset atrial fibrillation Plan: Patient needs cardiac work-up prior to surgical intervention.  Patient does not need acute surgical intervention.  He would like to  get his gallbladder out during this admission.  I suspect that I can schedule him for surgery on 03/02/2020 pending cardiology evaluation.  The risks and benefits of the procedure including bleeding, infection, hepatobiliary injury, and the possibility of an open procedure were fully explained to the patient, who gave informed consent.  He can be on a heart healthy diet for now.  Would continue IV antibiotics.  Aviva Signs 02/29/2020, 12:14 PM

## 2020-02-29 NOTE — CV Procedure (Signed)
Echo attempted but patient just got to floor and wanted to eat.will try again later.

## 2020-02-29 NOTE — Progress Notes (Signed)
ANTICOAGULATION CONSULT NOTE -   Pharmacy Consult for heparin Indication: atrial fibrillation  Allergies  Allergen Reactions  . Rofecoxib Other (See Comments)    (Vioxx)    Patient Measurements: Height: 5' 6.5" (168.9 cm) Weight: 136.1 kg (300 lb) IBW/kg (Calculated) : 64.95 Heparin Dosing Weight: 100.7 kg  Vital Signs: Temp: 98.2 F (36.8 C) (08/09 1600) Temp Source: Oral (08/09 1600) BP: 106/61 (08/09 1600) Pulse Rate: 103 (08/09 1600)  Labs: Recent Labs    02/28/20 1545 02/28/20 2237 02/29/20 0413 02/29/20 0819 02/29/20 1231 02/29/20 1611  HGB 14.6  --  13.0  --   --   --   HCT 43.9  --  39.8  --   --   --   PLT 375  --  340  --   --   --   HEPARINUNFRC  --   --   --  0.20*  --  0.12*  CREATININE 2.13*  --  1.60*  --  1.45*  --   TROPONINIHS  --  7  --   --   --   --     Estimated Creatinine Clearance: 57.3 mL/min (A) (by C-G formula based on SCr of 1.45 mg/dL (H)).   Medical History: Past Medical History:  Diagnosis Date  . Hyperlipidemia   . Hypertension   . Metabolic syndrome   . Metabolic syndrome   . Prediabetes     Medications:  See medication history  Assessment: 74 yo man to start heparin for afib.  He was not on anticoagulation PTA.  Heparin level 0.12- subtherapeutic  Goal of Therapy:  Heparin level 0.3-0.7 units/ml Monitor platelets by anticoagulation protocol: Yes   Plan:  Rebolus 3000 units x 1 Increase heparin infusion 1900 units/hr Check heparin level ~6-8 hours after start Monitor for bleeding complications  Isac Sarna, BS [place]ena [place], BCPS Clinical Pharmacist Pager [phone]  02/29/2020 5:21 PM

## 2020-02-29 NOTE — Consult Note (Signed)
Cardiology Consultation:   Patient ID: Ryan Garner MRN: [id number]; DOB: 06-27-1946  Admit date: 02/28/2020 Date of Consult: 02/29/2020  Primary Care Provider: [name], [name], MD [facility] Cardiologist: New   Patient Profile:   Ryan Garner is a 74 y.o. male with a hx of diabetes  who is being seen today for the evaluation of atrial fibrillation and preop risk stratifiations at the request of Dr Roderic Palau.  History of Present Illness:   Ryan Garner is a 74 yo with no prior cardiac history   Last week he developed abdominal pain and diarrhea.   He denes CP  No palpitations  No dizziness Breathing is OK Prior to acute illness he had been doing OK  Not as active as he was in his 32s but mows lawn on riding tractor, does some gardening, can walk without problem    Prior to admit he did not know he was a diabeti  Past Medical History:  Diagnosis Date  . Hyperlipidemia   . Hypertension   . Metabolic syndrome   . Metabolic syndrome   . Prediabetes     Past Surgical History:  Procedure Laterality Date  . APPENDECTOMY    . Eyelid Surgery Bilateral   . HERNIA REPAIR    . knee tendons repair    . ROTATOR CUFF REPAIR Bilateral   . TONSILLECTOMY         Inpatient Medications: Scheduled Meds: . metoprolol tartrate  12.5 mg Oral BID   Continuous Infusions: . 0.9 % NaCl with KCl 20 mEq / L 100 mL/hr at 02/29/20 1700  . heparin 1,900 Units/hr (02/29/20 1743)  . piperacillin-tazobactam (ZOSYN)  IV 12.5 mL/hr at 02/29/20 1700   PRN Meds: acetaminophen **OR** acetaminophen, ondansetron **OR** ondansetron (ZOFRAN) IV  Allergies:    Allergies  Allergen Reactions  . Rofecoxib Other (See Comments)    (Vioxx)    Social History:   Social History   Socioeconomic History  . Marital status: Married    Spouse name: [name]  . Number of children: 2  . Years of education: 50  . Highest education level: Doctorate  Occupational History  . 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 Smoking status: Former Smoker    Types: Cigarettes    Quit date: 04/02/1959    Years since quitting: 60.9  . Smokeless tobacco: Never Used  Vaping Use  . Vaping Use: Never used  Substance and Sexual Activity  . Alcohol use: No  . Drug use: No  . Sexual activity: Yes  Other Topics Concern  . Not on file  Social History Narrative  . Not on file   Social Determinants of Health   Financial Resource Strain:   . Difficulty of Paying Living Expenses:   Food Insecurity:   . Worried About Charity fundraiser in the Last Year:   . Arboriculturist in the Last Year:   Transportation Needs:   . Film/video editor (Medical):   Marland Kitchen Lack of Transportation (Non-Medical):   Physical Activity:   . Days of Exercise per Week:   . Minutes of Exercise per Session:   Stress: Stress Concern Present  . Feeling of Stress : To some extent  Social Connections:   . Frequency of Communication with Friends and Family:   . Frequency of Social Gatherings with Friends and Family:   . Attends Religious Services:   .  Active Member of Clubs or Organizations:   . Attends Archivist Meetings:   Marland Kitchen Marital Status:   Intimate Partner Violence:   . Fear of Current or Ex-Partner:   . Emotionally Abused:   Marland Kitchen Physically Abused:   . Sexually Abused:     Family History:    Family History  Problem Relation Age of Onset  . Cancer Mother        lungs  . Coronary artery disease Mother   . Diabetes Brother   . Heart attack Brother   . Diabetes Maternal Grandmother   . Arthritis Father      ROS:  Please see the history of present illness.   All other ROS reviewed and negative.     Physical Exam/Data:   Vitals:   02/29/20 1200 02/29/20 1600 02/29/20 2006 02/29/20 2030  BP: 114/69 106/61 (!) 103/39   Pulse: 94 (!) 103 99   Resp: 14 18 20    Temp:  98.2 F (36.8 C) 98.7 F (37.1 C)   TempSrc:  Oral      SpO2: 96% 99% 98% 97%  Weight:  (!) 137.1 kg    Height:  5' 6.5" (1.689 m)      Intake/Output Summary (Last 24 hours) at 02/29/2020 2210 Last data filed at 02/29/2020 1700 Gross per 24 hour  Intake 1283.31 ml  Output --  Net 1283.31 ml   Last 3 Weights 02/29/2020 02/28/2020 02/26/2020  Weight (lbs) 302 lb 4 oz 300 lb 295 lb  Weight (kg) 137.1 kg 136.079 kg 133.811 kg     Body mass index is 48.05 kg/m.  General: Morbidly obese 74 yo  HEENT: normal Lymph: no adenopathy Neck: no JVD Endocrine:  No thryomegaly Vascular: No carotid bruits; FA pulses 2+ bilaterally without bruits  Cardiac:  Irreg irreg  S1, S2  Gr II/IV systolic murmur LSB  No S3 Lungs:  clear to auscultation bilaterally, no wheezing, rhonchi or rales  Abd: Obese + BS  Deferred   Ext: Triv  edema Musculoskeletal:  No deformities, BUE and BLE strength normal and equal Skin: warm and dry  Neuro:  CNs 2-12 intact, no focal abnormalities noted Psych:  Normal affect   EKG:  The EKG was personally reviewed and demonstrates:  Atrial fibrillation  118 bpm   Poor R wave progression  Occasional PVC Telemetry:  Telemetry was personally reviewed and demonstrates:  Atrial fibrillation     Relevant CV Studies:  1. Poor acoustic windows limit study Would recomm limited echo and Definity to further define LVEF and wall motion. . Left ventricular ejection fraction, by estimation, is indeterminate.. There is mild left ventricular hypertrophy. Left ventricular diastolic parameters are indeterminate. 2. Right ventricular systolic function is normal. The right ventricular size is mildly enlarged. There is moderately elevated pulmonary artery systolic pressure. 3. Left atrial size was severely dilated. 4. Right atrial size was mildly dilated. 5. The mitral valve is normal in structure. Mild mitral valve regurgitation. 6. AV is thickened, calcified with mildly restricted motion Peak and mean gradients through the valve are approximately  27 and 16 mm Hg respectively consistent with mild AS. Marland Kitchen The aortic valve is tricuspid. Aortic valve regurgitation is not visualized. 7. The inferior vena cava is normal in size with greater than 50% respiratory variability, suggesting right atrial pressure of 3 mmHg.  Laboratory Data:  High Sensitivity Troponin:   Recent Labs  Lab 02/28/20 2237  TROPONINIHS 7     Chemistry Recent Labs  Lab  02/28/20 1545 02/29/20 0413 02/29/20 1231  NA 132* 136 137  K 3.3* 3.3* 3.5  CL 96* 104 106  CO2 22 21* 22  GLUCOSE 159* 149* 134*  BUN 77* 67* 56*  CREATININE 2.13* 1.60* 1.45*  CALCIUM 9.1 8.2* 8.2*  GFRNONAA 29* 41* 46*  GFRAA 34* 48* 54*  ANIONGAP 14 11 9     Recent Labs  Lab 02/28/20 1545 02/29/20 0413  PROT 7.3 6.1*  ALBUMIN 3.4* 2.8*  AST 28 17  ALT 28 23  ALKPHOS 61 52  BILITOT 0.9 0.9   Hematology Recent Labs  Lab 02/26/20 1643 02/28/20 1545 02/29/20 0413  WBC 29.5* 11.9* 7.9  RBC 5.34 4.99 4.49  HGB 15.8 14.6 13.0  HCT 45.8 43.9 39.8  MCV 86 88.0 88.6  MCH 29.6 29.3 29.0  MCHC 34.5 33.3 32.7  RDW 12.1 12.4 12.5  PLT 347 375 340   BNPNo results for input(s): BNP, PROBNP in the last 168 hours.  DDimer No results for input(s): DDIMER in the last 168 hours.   Radiology/Studies:  CT ABDOMEN PELVIS WO CONTRAST  Result Date: 02/28/2020 CLINICAL DATA:  Nausea, vomiting.  Abdominal pain. EXAM: CT ABDOMEN AND PELVIS WITHOUT CONTRAST TECHNIQUE: Multidetector CT imaging of the abdomen and pelvis was performed following the standard protocol without IV contrast. COMPARISON:  None. FINDINGS: Lower chest: Coronary artery calcifications. Scarring in the lung bases. No effusions. Hepatobiliary: Gallbladder is distended with surrounding inflammation most compatible with cholecystitis. Scattered calcifications in the liver. Pancreas: No focal abnormality or ductal dilatation. Spleen: No focal abnormality.  Normal size. Adrenals/Urinary Tract: No adrenal abnormality. No focal  renal abnormality. No stones or hydronephrosis. Urinary bladder is unremarkable. Stomach/Bowel: Mildly prominent small bowel loops in the pelvis containing fluid and air-fluid levels. Fluid throughout the colon. Distal small bowel loops are decompressed. This could reflect focal ileus or distal small bowel obstruction. Vascular/Lymphatic: Aortic atherosclerosis. No evidence of aneurysm or adenopathy. Reproductive: No visible focal abnormality. Other: No free fluid or free air. Umbilical and left periumbilical hernias noted containing fat. There is stranding within the fat suggest the possibility of incarceration. Left inguinal hernia contains fat. No free fluid or free air. Musculoskeletal: No acute bony abnormality. IMPRESSION: Distended gallbladder with surrounding inflammation most compatible with acute cholecystitis. No ductal dilatation seen. This could be further evaluated with right upper quadrant ultrasound if felt clinically indicated. Prominent small bowel loops with air-fluid levels. Distal small bowel loops are decompressed. This could reflect focal ileus or distal small bowel obstruction. Umbilical and left periumbilical hernias contain fat with stranding which could reflect incarceration. Left inguinal hernia contains fat. Electronically Signed   By: Rolm Baptise M.D.   On: 02/28/2020 22:19   DG Chest Portable 1 View  Result Date: 02/28/2020 CLINICAL DATA:  Nausea, vomiting EXAM: PORTABLE CHEST 1 VIEW COMPARISON:  10/15/2013 FINDINGS: Linear bibasilar atelectasis. Heart is borderline in size. No effusions or acute bony abnormality. IMPRESSION: Bibasilar atelectasis. Electronically Signed   By: Rolm Baptise M.D.   On: 02/28/2020 21:52   ECHOCARDIOGRAM COMPLETE  Result Date: 02/29/2020    ECHOCARDIOGRAM REPORT   Patient Name:   Ryan Garner Date of Exam: 02/29/2020 Medical Rec #:  [id number]    Height:       68.0 in Accession #:    [id number]   Weight:       300.0 lb Date of Birth:  1945-10-03     BSA:           2.428 m  Patient Age:    36 years     BP:           114/69 mmHg Patient Gender: M            HR:           90 bpm. Exam Location:  [facility] Procedure: 2D Echo Indications:    Atrial Fibrillation 427.31 / I48.91  History:        Patient has no prior history of Echocardiogram examinations.                 Arrythmias:Atrial Fibrillation; Risk Factors:Former Smoker,                 Diabetes, Dyslipidemia and Hypertension. Ascending Aortic                 Aneurysm.  Sonographer:    Leavy Cella RDCS (AE) Referring Phys: [id number] DAVID MANUEL [place]  1. Poor acoustic windows limit study Would recomm limited echo and Definity to further define LVEF and wall motion. . Left ventricular ejection fraction, by estimation, is indeterminate.. There is mild left ventricular hypertrophy. Left ventricular diastolic parameters are indeterminate.  2. Right ventricular systolic function is normal. The right ventricular size is mildly enlarged. There is moderately elevated pulmonary artery systolic pressure.  3. Left atrial size was severely dilated.  4. Right atrial size was mildly dilated.  5. The mitral valve is normal in structure. Mild mitral valve regurgitation.  6. AV is thickened, calcified with mildly restricted motion Peak and mean gradients through the valve are approximately 27 and 16 mm Hg respectively consistent with mild AS. Marland Kitchen The aortic valve is tricuspid. Aortic valve regurgitation is not visualized.  7. The inferior vena cava is normal in size with greater than 50% respiratory variability, suggesting right atrial pressure of 3 mmHg. FINDINGS  Left Ventricle: Poor acoustic windows limit study Would recomm limited echo and Definity to further define LVEF and wall motion. Left ventricular ejection fraction, by estimation, is indeterminate%. The left ventricle has not fully assessed function. Left ventricular endocardial border not optimally defined to evaluate regional wall motion. The left  ventricular internal cavity size was normal in size. There is mild left ventricular hypertrophy. Left ventricular diastolic parameters are indeterminate. Right Ventricle: The right ventricular size is mildly enlarged. Right vetricular wall thickness was not assessed. Right ventricular systolic function is normal. There is moderately elevated pulmonary artery systolic pressure. The tricuspid regurgitant velocity is 3.45 m/s, and with an assumed right atrial pressure of 10 mmHg, the estimated right ventricular systolic pressure is 23.5 mmHg. Left Atrium: Left atrial size was severely dilated. Right Atrium: Right atrial size was mildly dilated. Pericardium: There is no evidence of pericardial effusion. Mitral Valve: The mitral valve is normal in structure. Mild mitral valve regurgitation. Tricuspid Valve: The tricuspid valve is normal in structure. Tricuspid valve regurgitation is mild. Aortic Valve: AV is thickened, calcified with mildly restricted motion Peak and mean gradients through the valve are approximately 27 and 16 mm Hg respectively consistent with mild AS. The aortic valve is tricuspid. Aortic valve regurgitation is not visualized. Pulmonic Valve: The pulmonic valve was not well visualized. Pulmonic valve regurgitation is not visualized. Aorta: The aortic root is normal in size and structure. Venous: The inferior vena cava is normal in size with greater than 50% respiratory variability, suggesting right atrial pressure of 3 mmHg. IAS/Shunts: The interatrial septum was not assessed.  LEFT VENTRICLE PLAX 2D LVIDd:  4.78 cm  Diastology LVIDs:         3.99 cm  LV e' lateral:   11.20 cm/s LV PW:         1.12 cm  LV E/e' lateral: 10.0 LV IVS:        1.27 cm  LV e' medial:    9.03 cm/s LVOT diam:     2.10 cm  LV E/e' medial:  12.4 LVOT Area:     3.46 cm  RIGHT VENTRICLE RV S prime:     15.60 cm/s TAPSE (M-mode): 2.9 cm LEFT ATRIUM              Index LA diam:        5.10 cm  2.10 cm/m LA Vol (A2C):   131.0  ml 53.95 ml/m LA Vol (A4C):   124.0 ml 51.07 ml/m LA Biplane Vol: 134.0 ml 55.19 ml/m   AORTA Ao Root diam: 3.40 cm MITRAL VALVE                TRICUSPID VALVE MV Area (PHT): 4.54 cm     TR Peak grad:   47.6 mmHg MV Decel Time: 167 msec     TR Vmax:        345.00 cm/s MR Peak grad: 112.4 mmHg MR Mean grad: 73.0 mmHg     SHUNTS MR Vmax:      530.00 cm/s   Systemic Diam: 2.10 cm MR Vmean:     406.0 cm/s MV E velocity: 112.00 cm/s MV A velocity: 33.80 cm/s MV E/A ratio:  3.31 Dorris Carnes MD Electronically signed by Dorris Carnes MD Signature Date/Time: 02/29/2020/5:18:50 PM    Final    US Abdomen Limited RUQ  Result Date: 02/29/2020 CLINICAL DATA:  Abdominal pain. Nausea and vomiting. Cholelithiasis. EXAM: ULTRASOUND ABDOMEN LIMITED RIGHT UPPER QUADRANT COMPARISON:  None. FINDINGS: Gallbladder: Gallbladder is distended and contains multiple gallstones, largest measuring 1.7 cm. Diffuse gallbladder wall thickening is seen measuring up to 9 mm in thickness. These findings are consistent with acute cholecystitis. Common bile duct: Diameter: 4 mm, within normal limits. Liver: No focal lesion identified. Within normal limits in parenchymal echogenicity. Portal vein is patent on color Doppler imaging with normal direction of blood flow towards the liver. Other: None. IMPRESSION: Cholelithiasis with diffuse gallbladder wall thickening, consistent with acute cholecystitis. No evidence of biliary ductal dilatation. Electronically Signed   By: Marlaine Hind M.D.   On: 02/29/2020 09:22   {   Assessment and Plan:   1   Atrial fibrillation New diagnosis for patient   CHADSVASc score is 4.   Currently on heparin   Will need long term anticoagulation  Would incrase metoprolol to 12.5 qid   Follow BP and HR Echo today shows severe LA enlargement   Would recomm limited echo with Definity to confirm LVEF   After recovers from surgery and has been on anticoagulant for 1 month would recomm attempt at cardioversion Heparin for  now  Eliquis or Xarelto after surgery     2  Preop cardiac risk assessment   Pt with cholecystitis  Plan for surgery He has no known CAD  Is active greater than 4 METS.   No symptoms of heart failure     Echo today diffcult  I have ordered limited with Definity   If LVEF normal feel patient is at relatively low risk for major cardiac complication     Pt denies CP   active, over 4 METS  3  AS   Echo today showed AV thickened with mild stenosis  Mean gradient is 16 mm Hg   Will need to be followed with periodic echoes  4  DM   New dx for patient   5  Lipids  Check in AM   For questions or updates, please contact [place] Please consult [email or web address] for contact info under    Signed, Dorris Carnes, MD  02/29/2020 10:10 PM

## 2020-02-29 NOTE — Progress Notes (Signed)
*  PRELIMINARY RESULTS* Echocardiogram 2D Echocardiogram has been performed.  Ryan Garner 02/29/2020, 3:54 PM

## 2020-02-29 NOTE — Progress Notes (Signed)
ANTICOAGULATION CONSULT NOTE - Initial Consult  Pharmacy Consult for heparin Indication: atrial fibrillation  Allergies  Allergen Reactions  . Rofecoxib Other (See Comments)    (Vioxx)    Patient Measurements: Height: 5\' 8"  (172.7 cm) Weight: 136.1 kg (300 lb) IBW/kg (Calculated) : 68.4 Heparin Dosing Weight: 100.7 kg  Vital Signs: Temp: 98 F (36.7 C) (08/08 1436) Temp Source: Oral (08/08 1436) BP: 121/62 (08/08 2230) Pulse Rate: 63 (08/08 2231)  Labs: Recent Labs    02/26/20 1643 02/28/20 1545 02/28/20 2237  HGB 15.8 14.6  --   HCT 45.8 43.9  --   PLT 347 375  --   CREATININE  --  2.13*  --   TROPONINIHS  --   --  7    Estimated Creatinine Clearance: 39.9 mL/min (A) (by C-G formula based on SCr of 2.13 mg/dL (H)).   Medical History: Past Medical History:  Diagnosis Date  . Hyperlipidemia   . Hypertension   . Metabolic syndrome   . Metabolic syndrome   . Prediabetes     Medications:  See medication history  Assessment: 74 yo man to start heparin for afib.  He was not on anticoagulation PTA. Goal of Therapy:  Heparin level 0.3-0.7 units/ml Monitor platelets by anticoagulation protocol: Yes   Plan:  Heparin bolus 4000 units and drip at 1400 units/hr Check heparin level ~6-8 hours after start Monitor for bleeding complications  Ryan Garner 02/29/2020,12:36 AM

## 2020-02-29 NOTE — Progress Notes (Signed)
PROGRESS NOTE    Ryan Garner  WIO:[id number] DOB: [birth year] DOA: 02/28/2020 PCP: [name], [name], MD    Brief Narrative:  HPI: Ryan Garner is a 74 y.o. male with medical history significant of hyperlipidemia, hypertension, type 2 diabetes, class III obesity with a BMI of 45.61 kg/m who is coming to the emergency department due to abdominal pain that started on Wednesday evening followed by 3 episodes of emesis that night, then several episodes of diarrhea since Thursday associated with fatigue, decreased appetite and abdominal pain.  He has been drinking fluids trying to stay hydrated as he could, but decided to come to the ED since his symptoms have not improved.  Not sick contacts or travel history.  Denies melena or hematochezia.  No dysuria, frequency or hematuria.  He denies fever, chills, sore throat, rhinorrhea, productive cough, wheezing or hemoptysis Denies chest pain, lightheadedness, dyspnea, palpitations, diaphoresis, PND, orthopnea or recent pitting edema of the lower extremities.  No polyuria, polydipsia, polyphagia or blurred vision.   Assessment & Plan:   Principal Problem:   Acute cholecystitis Active Problems:   Hypertension   Hyperlipidemia   Type 2 diabetes mellitus ([place])   Morbid obesity ([place])   AKI (acute kidney injury) ([place])   Hypokalemia   Hyponatremia   New onset atrial fibrillation ([place])   1. Acute cholecystitis.  Was seen by general surgery and plans are for cholecystectomy.  Continue on antibiotics for now. 2. New onset atrial fibrillation with rapid ventricular response.  Incidental finding, patient did not have any symptoms.  He was noted to be tachycardic which improved with metoprolol. CHADSVASc score of at least 4.  Started on heparin infusion.  This will need to be held prior to surgery.  Echocardiogram has been ordered.  Cardiology consulted. 3. Acute kidney injury.  Likely related to volume depletion with ACE inhibitor use.  Hold nephrotoxic  agents and continue IV fluids.  Monitor urine output. 4. Hypokalemia.  Replace 5. Hyponatremia.  Secondary to GI losses/thiazide.  Continued on IV fluids. 6. Hypertension.  Holding hydrochlorothiazide and ACE inhibitor. 7. Type 2 diabetes, diet controlled.  Check A1c.  Follow blood sugars. 8. Morbid obesity.  BMI 45.  Recommended lifestyle modifications.   DVT prophylaxis: Heparin infusion  Code Status: Full code Family Communication: Discussed with patient Disposition Plan: Status is: Inpatient  Remains inpatient appropriate because:Ongoing diagnostic testing needed not appropriate for outpatient work up   Dispo: The patient is from: Home              Anticipated d/c is to: Home              Anticipated d/c date is: 3 days              Patient currently is not medically stable to d/c.   Consultants:   General surgery  Cardiology  Procedures:     Antimicrobials:   Zosyn 8/8 >   Subjective: No nausea or vomiting.  Abdominal pain has improved since arrival.  No palpitations.  Objective: Vitals:   02/29/20 0713 02/29/20 0800 02/29/20 1000 02/29/20 1200  BP: 127/78 116/83 112/64 114/69  Pulse: (!) 107 (!) 113 (!) 106 94  Resp: 18 15 15 14   Temp:      TempSrc:      SpO2: 97% 98% 100% 96%  Weight:      Height:       No intake or output data in the 24 hours ending 02/29/20 [place]  02/28/20 1436  Weight: 136.1 kg    Examination:  General exam: Appears calm and comfortable  Respiratory system: Clear to auscultation. Respiratory effort normal. Cardiovascular system: irregular. No JVD, murmurs, rubs, gallops or clicks. No pedal edema. Gastrointestinal system: Abdomen is soft, mild tenderness in RUQ. No organomegaly or masses felt. Normal bowel sounds heard. Central nervous system: Alert and oriented. No focal neurological deficits. Extremities: Symmetric 5 x 5 power. Skin: No rashes, lesions or ulcers Psychiatry: Judgement and insight appear  normal. Mood & affect appropriate.     Data Reviewed: I have personally reviewed following labs and imaging studies  CBC: Recent Labs  Lab 02/26/20 1643 02/28/20 1545 02/29/20 0413  WBC 29.5* 11.9* 7.9  NEUTROABS 26.5*  --  5.9  HGB 15.8 14.6 13.0  HCT 45.8 43.9 39.8  MCV 86 88.0 88.6  PLT 347 375 492   Basic Metabolic Panel: Recent Labs  Lab 02/28/20 1545 02/28/20 2237 02/29/20 0413 02/29/20 1231  NA 132*  --  136 137  K 3.3*  --  3.3* 3.5  CL 96*  --  104 106  CO2 22  --  21* 22  GLUCOSE 159*  --  149* 134*  BUN 77*  --  67* 56*  CREATININE 2.13*  --  1.60* 1.45*  CALCIUM 9.1  --  8.2* 8.2*  MG  --  2.2  --   --   PHOS  --  3.8  --   --    GFR: Estimated Creatinine Clearance: 58.5 mL/min (A) (by C-G formula based on SCr of 1.45 mg/dL (H)). Liver Function Tests: Recent Labs  Lab 02/28/20 1545 02/29/20 0413  AST 28 17  ALT 28 23  ALKPHOS 61 52  BILITOT 0.9 0.9  PROT 7.3 6.1*  ALBUMIN 3.4* 2.8*   Recent Labs  Lab 02/28/20 1545  LIPASE 17   No results for input(s): AMMONIA in the last 168 hours. Coagulation Profile: No results for input(s): INR, PROTIME in the last 168 hours. Cardiac Enzymes: No results for input(s): CKTOTAL, CKMB, CKMBINDEX, TROPONINI in the last 168 hours. BNP (last 3 results) No results for input(s): PROBNP in the last 8760 hours. HbA1C: No results for input(s): HGBA1C in the last 72 hours. CBG: Recent Labs  Lab 02/29/20 0826  GLUCAP 147*   Lipid Profile: No results for input(s): CHOL, HDL, LDLCALC, TRIG, CHOLHDL, LDLDIRECT in the last 72 hours. Thyroid Function Tests: Recent Labs    02/28/20 1535  TSH 0.599   Anemia Panel: No results for input(s): VITAMINB12, FOLATE, FERRITIN, TIBC, IRON, RETICCTPCT in the last 72 hours. Sepsis Labs: Recent Labs  Lab 02/28/20 2237  LATICACIDVEN 1.2    Recent Results (from the past 240 hour(s))  Novel Coronavirus, NAA ([facility])     Status: None   Collection Time: 02/26/20  12:00 AM   Specimen: Nasopharyngeal(NP) swabs in vial transport medium   Nasopharynge  Is this  Result Value Ref Range Status   SARS-CoV-2, NAA Not Detected Not Detected Final    Comment: This nucleic acid amplification test was developed and its performance characteristics determined by [employer]. Nucleic acid amplification tests include RT-PCR and TMA. This test has not been FDA cleared or approved. This test has been authorized by FDA under an Emergency Use Authorization (EUA). This test is only authorized for the duration of time the declaration that circumstances exist justifying the authorization of the emergency use of in vitro diagnostic tests for detection of SARS-CoV-2 virus and/or diagnosis of COVID-19  infection under section 564(b)(1) of the Act, 21 U.S.C. 161WRU-0(A) (1), unless the authorization is terminated or revoked sooner. When diagnostic testing is negative, the possibility of a false negative result should be considered in the context of a patient's recent exposures and the presence of clinical signs and symptoms consistent with COVID-19. An individual without symptoms of COVID-19 and who is not shedding SARS-CoV-2 virus wo uld expect to have a negative (not detected) result in this assay.   SARS-COV-2, NAA 2 DAY TAT     Status: None   Collection Time: 02/26/20 12:00 AM   Nasopharynge  Is this  Result Value Ref Range Status   SARS-CoV-2, NAA 2 DAY TAT Performed  Final  SARS Coronavirus 2 by RT PCR (hospital order, performed in [place] hospital lab) Nasopharyngeal Nasopharyngeal Swab     Status: None   Collection Time: 02/28/20  9:38 PM   Specimen: Nasopharyngeal Swab  Result Value Ref Range Status   SARS Coronavirus 2 NEGATIVE NEGATIVE Final    Comment: (NOTE) SARS-CoV-2 target nucleic acids are NOT DETECTED.  The SARS-CoV-2 RNA is generally detectable in upper and lower respiratory specimens during the acute phase of infection. The  lowest concentration of SARS-CoV-2 viral copies this assay can detect is 250 copies / mL. A negative result does not preclude SARS-CoV-2 infection and should not be used as the sole basis for treatment or other patient management decisions.  A negative result may occur with improper specimen collection / handling, submission of specimen other than nasopharyngeal swab, presence of viral mutation(s) within the areas targeted by this assay, and inadequate number of viral copies (<250 copies / mL). A negative result must be combined with clinical observations, patient history, and epidemiological information.  Fact Sheet for Patients:   StrictlyIdeas.no  Fact Sheet for Healthcare Providers: BankingDealers.co.za  This test is not yet approved or  cleared by the [place] FDA and has been authorized for detection and/or diagnosis of SARS-CoV-2 by FDA under an Emergency Use Authorization (EUA).  This EUA will remain in effect (meaning this test can be used) for the duration of the COVID-19 declaration under Section 564(b)(1) of the Act, 21 U.S.C. section 360bbb-3(b)(1), unless the authorization is terminated or revoked sooner.  Performed at [facility], [address], [place], [place] [address]          Radiology Studies: CT ABDOMEN PELVIS WO CONTRAST  Result Date: 02/28/2020 CLINICAL DATA:  Nausea, vomiting.  Abdominal pain. EXAM: CT ABDOMEN AND PELVIS WITHOUT CONTRAST TECHNIQUE: Multidetector CT imaging of the abdomen and pelvis was performed following the standard protocol without IV contrast. COMPARISON:  None. FINDINGS: Lower chest: Coronary artery calcifications. Scarring in the lung bases. No effusions. Hepatobiliary: Gallbladder is distended with surrounding inflammation most compatible with cholecystitis. Scattered calcifications in the liver. Pancreas: No focal abnormality or ductal dilatation. Spleen: No focal abnormality.   Normal size. Adrenals/Urinary Tract: No adrenal abnormality. No focal renal abnormality. No stones or hydronephrosis. Urinary bladder is unremarkable. Stomach/Bowel: Mildly prominent small bowel loops in the pelvis containing fluid and air-fluid levels. Fluid throughout the colon. Distal small bowel loops are decompressed. This could reflect focal ileus or distal small bowel obstruction. Vascular/Lymphatic: Aortic atherosclerosis. No evidence of aneurysm or adenopathy. Reproductive: No visible focal abnormality. Other: No free fluid or free air. Umbilical and left periumbilical hernias noted containing fat. There is stranding within the fat suggest the possibility of incarceration. Left inguinal hernia contains fat. No free fluid or free air. Musculoskeletal: No  acute bony abnormality. IMPRESSION: Distended gallbladder with surrounding inflammation most compatible with acute cholecystitis. No ductal dilatation seen. This could be further evaluated with right upper quadrant ultrasound if felt clinically indicated. Prominent small bowel loops with air-fluid levels. Distal small bowel loops are decompressed. This could reflect focal ileus or distal small bowel obstruction. Umbilical and left periumbilical hernias contain fat with stranding which could reflect incarceration. Left inguinal hernia contains fat. Electronically Signed   By: Rolm Baptise M.D.   On: 02/28/2020 22:19   DG Chest Portable 1 View  Result Date: 02/28/2020 CLINICAL DATA:  Nausea, vomiting EXAM: PORTABLE CHEST 1 VIEW COMPARISON:  10/15/2013 FINDINGS: Linear bibasilar atelectasis. Heart is borderline in size. No effusions or acute bony abnormality. IMPRESSION: Bibasilar atelectasis. Electronically Signed   By: Rolm Baptise M.D.   On: 02/28/2020 21:52   US Abdomen Limited RUQ  Result Date: 02/29/2020 CLINICAL DATA:  Abdominal pain. Nausea and vomiting. Cholelithiasis. EXAM: ULTRASOUND ABDOMEN LIMITED RIGHT UPPER QUADRANT COMPARISON:  None.  FINDINGS: Gallbladder: Gallbladder is distended and contains multiple gallstones, largest measuring 1.7 cm. Diffuse gallbladder wall thickening is seen measuring up to 9 mm in thickness. These findings are consistent with acute cholecystitis. Common bile duct: Diameter: 4 mm, within normal limits. Liver: No focal lesion identified. Within normal limits in parenchymal echogenicity. Portal vein is patent on color Doppler imaging with normal direction of blood flow towards the liver. Other: None. IMPRESSION: Cholelithiasis with diffuse gallbladder wall thickening, consistent with acute cholecystitis. No evidence of biliary ductal dilatation. Electronically Signed   By: Marlaine Hind M.D.   On: 02/29/2020 09:22        Scheduled Meds: Continuous Infusions: . 0.9 % NaCl with KCl 20 mEq / L 100 mL/hr at 02/29/20 0520  . heparin 1,600 Units/hr (02/29/20 1450)  . piperacillin-tazobactam (ZOSYN)  IV 3.375 g (02/29/20 0821)     LOS: 1 day    Time spent: 68mins    Kathie Dike, MD [facility]   If 7PM-7AM, please contact night-coverage [email or web address]  02/29/2020, 3:34 PM

## 2020-03-01 ENCOUNTER — Inpatient Hospital Stay (HOSPITAL_COMMUNITY): Payer: Medicare Other

## 2020-03-01 ENCOUNTER — Other Ambulatory Visit: Payer: Medicare Other

## 2020-03-01 DIAGNOSIS — I4891 Unspecified atrial fibrillation: Secondary | ICD-10-CM

## 2020-03-01 LAB — CBC
HCT: 41.2 % (ref 39.0–52.0)
Hemoglobin: 13.1 g/dL (ref 13.0–17.0)
MCH: 28.6 pg (ref 26.0–34.0)
MCHC: 31.8 g/dL (ref 30.0–36.0)
MCV: 90 fL (ref 80.0–100.0)
Platelets: 330 10*3/uL (ref 150–400)
RBC: 4.58 MIL/uL (ref 4.22–5.81)
RDW: 12.3 % (ref 11.5–15.5)
WBC: 7.8 10*3/uL (ref 4.0–10.5)
nRBC: 0 % (ref 0.0–0.2)

## 2020-03-01 LAB — COMPREHENSIVE METABOLIC PANEL
ALT: 22 U/L (ref 0–44)
AST: 16 U/L (ref 15–41)
Albumin: 2.6 g/dL — ABNORMAL LOW (ref 3.5–5.0)
Alkaline Phosphatase: 53 U/L (ref 38–126)
Anion gap: 5 (ref 5–15)
BUN: 41 mg/dL — ABNORMAL HIGH (ref 8–23)
CO2: 22 mmol/L (ref 22–32)
Calcium: 7.9 mg/dL — ABNORMAL LOW (ref 8.9–10.3)
Chloride: 109 mmol/L (ref 98–111)
Creatinine, Ser: 1.1 mg/dL (ref 0.61–1.24)
GFR calc Af Amer: >60 mL/min (ref >60)
GFR calc non Af Amer: >60 mL/min (ref >60)
Glucose, Bld: 124 mg/dL — ABNORMAL HIGH (ref 70–99)
Potassium: 3.2 mmol/L — ABNORMAL LOW (ref 3.5–5.1)
Sodium: 136 mmol/L (ref 135–145)
Total Bilirubin: 0.5 mg/dL (ref 0.3–1.2)
Total Protein: 5.8 g/dL — ABNORMAL LOW (ref 6.5–8.1)

## 2020-03-01 LAB — GLUCOSE, CAPILLARY
Glucose-Capillary: 119 mg/dL — ABNORMAL HIGH (ref 70–99)
Glucose-Capillary: 155 mg/dL — ABNORMAL HIGH (ref 70–99)
Glucose-Capillary: 115 mg/dL — ABNORMAL HIGH (ref 70–99)
Glucose-Capillary: 116 mg/dL — ABNORMAL HIGH (ref 70–99)

## 2020-03-01 LAB — ECHOCARDIOGRAM LIMITED
Calc EF: 64 %
Height: 66.5 in
S' Lateral: 3.56 cm
Single Plane A2C EF: 58.8 %
Single Plane A4C EF: 67.6 %
Weight: 4836.01 oz

## 2020-03-01 LAB — HEMOGLOBIN A1C
Hgb A1c MFr Bld: 6 % — ABNORMAL HIGH (ref 4.8–5.6)
Mean Plasma Glucose: 125.5 mg/dL

## 2020-03-01 LAB — HEPARIN LEVEL (UNFRACTIONATED)
Heparin Unfractionated: 0.33 IU/mL (ref 0.30–0.70)
Heparin Unfractionated: 0.3 IU/mL (ref 0.30–0.70)

## 2020-03-01 LAB — MAGNESIUM: Magnesium: 2.1 mg/dL (ref 1.7–2.4)

## 2020-03-01 MED ORDER — INSULIN ASPART 100 UNIT/ML ~~LOC~~ SOLN
0.0000 [IU] | Freq: Three times a day (TID) | SUBCUTANEOUS | Status: DC
  Administered 2020-03-01: 2 [IU] via SUBCUTANEOUS
  Administered 2020-03-02: 1 [IU] via SUBCUTANEOUS
  Administered 2020-03-02: 2 [IU] via SUBCUTANEOUS

## 2020-03-01 MED ORDER — POTASSIUM CHLORIDE 10 MEQ/100ML IV SOLN
10.0000 meq | INTRAVENOUS | Status: AC
  Administered 2020-03-01 (×4): 10 meq via INTRAVENOUS
  Filled 2020-03-01 (×4): qty 100

## 2020-03-01 MED ORDER — PERFLUTREN LIPID MICROSPHERE
1.0000 mL | INTRAVENOUS | Status: AC | PRN
  Administered 2020-03-01: 2 mL via INTRAVENOUS
  Filled 2020-03-01: qty 10

## 2020-03-01 MED ORDER — POTASSIUM CHLORIDE CRYS ER 20 MEQ PO TBCR
40.0000 meq | EXTENDED_RELEASE_TABLET | Freq: Once | ORAL | Status: AC
  Administered 2020-03-01: 40 meq via ORAL
  Filled 2020-03-01: qty 2

## 2020-03-01 NOTE — Progress Notes (Signed)
ANTICOAGULATION CONSULT NOTE -   Pharmacy Consult for heparin Indication: atrial fibrillation  Assessment: 74 yo man to start heparin for afib.  He was not on anticoagulation PTA.  Heparin level 0.33 units/ml  Goal of Therapy:  Heparin level 0.3-0.7 units/ml Monitor platelets by anticoagulation protocol: Yes   Plan:  Continue heparin infusion 1900 units/hr Check heparin level ~6-8 hours to confirm Monitor for bleeding complications  Thanks for allowing pharmacy to be a part of this patient's care.  Excell Seltzer, PharmD Clinical Pharmacist 03/01/2020 3:54 AM

## 2020-03-01 NOTE — H&P (View-Only) (Signed)
Subjective: Patient has no abdominal pain.  Tolerating diet well.  Objective: Vital signs in last 24 hours: Temp:  [98 F (36.7 C)-98.7 F (37.1 C)] 98 F (36.7 C) (08/10 0357) Pulse Rate:  [40-103] 92 (08/10 0357) Resp:  [14-20] 20 (08/10 0357) BP: (103-122)/(39-84) 110/51 (08/10 0357) SpO2:  [96 %-99 %] 98 % (08/10 0357) Weight:  [137.1 kg] 137.1 kg (08/09 1600) Last BM Date: 02/29/20  Intake/Output from previous day: 08/09 0701 - 08/10 0700 In: 2122.2 [P.O.:240; I.V.:1764.5; IV Piggyback:117.7] Out: -  Intake/Output this shift: No intake/output data recorded.  General appearance: alert, cooperative and no distress GI: soft, non-tender; bowel sounds normal; no masses,  no organomegaly  Lab Results:  Recent Labs    02/29/20 0413 03/01/20 0220  WBC 7.9 7.8  HGB 13.0 13.1  HCT 39.8 41.2  PLT 340 330   BMET Recent Labs    02/29/20 1231 03/01/20 0220  NA 137 136  K 3.5 3.2*  CL 106 109  CO2 22 22  GLUCOSE 134* 124*  BUN 56* 41*  CREATININE 1.45* 1.10  CALCIUM 8.2* 7.9*   PT/INR No results for input(s): LABPROT, INR in the last 72 hours.  Studies/Results: CT ABDOMEN PELVIS WO CONTRAST  Result Date: 02/28/2020 CLINICAL DATA:  Nausea, vomiting.  Abdominal pain. EXAM: CT ABDOMEN AND PELVIS WITHOUT CONTRAST TECHNIQUE: Multidetector CT imaging of the abdomen and pelvis was performed following the standard protocol without IV contrast. COMPARISON:  None. FINDINGS: Lower chest: Coronary artery calcifications. Scarring in the lung bases. No effusions. Hepatobiliary: Gallbladder is distended with surrounding inflammation most compatible with cholecystitis. Scattered calcifications in the liver. Pancreas: No focal abnormality or ductal dilatation. Spleen: No focal abnormality.  Normal size. Adrenals/Urinary Tract: No adrenal abnormality. No focal renal abnormality. No stones or hydronephrosis. Urinary bladder is unremarkable. Stomach/Bowel: Mildly prominent small bowel  loops in the pelvis containing fluid and air-fluid levels. Fluid throughout the colon. Distal small bowel loops are decompressed. This could reflect focal ileus or distal small bowel obstruction. Vascular/Lymphatic: Aortic atherosclerosis. No evidence of aneurysm or adenopathy. Reproductive: No visible focal abnormality. Other: No free fluid or free air. Umbilical and left periumbilical hernias noted containing fat. There is stranding within the fat suggest the possibility of incarceration. Left inguinal hernia contains fat. No free fluid or free air. Musculoskeletal: No acute bony abnormality. IMPRESSION: Distended gallbladder with surrounding inflammation most compatible with acute cholecystitis. No ductal dilatation seen. This could be further evaluated with right upper quadrant ultrasound if felt clinically indicated. Prominent small bowel loops with air-fluid levels. Distal small bowel loops are decompressed. This could reflect focal ileus or distal small bowel obstruction. Umbilical and left periumbilical hernias contain fat with stranding which could reflect incarceration. Left inguinal hernia contains fat. Electronically Signed   By: Rolm Baptise M.D.   On: 02/28/2020 22:19   DG Chest Portable 1 View  Result Date: 02/28/2020 CLINICAL DATA:  Nausea, vomiting EXAM: PORTABLE CHEST 1 VIEW COMPARISON:  10/15/2013 FINDINGS: Linear bibasilar atelectasis. Heart is borderline in size. No effusions or acute bony abnormality. IMPRESSION: Bibasilar atelectasis. Electronically Signed   By: Rolm Baptise M.D.   On: 02/28/2020 21:52   ECHOCARDIOGRAM COMPLETE  Result Date: 02/29/2020    ECHOCARDIOGRAM REPORT   Patient Name:   ZEVIN NEVARES Date of Exam: 02/29/2020 Medical Rec #:  [id number]    Height:       68.0 in Accession #:    [id number]   Weight:       300.0  lb Date of Birth:  02/10/1946     BSA:          2.428 m Patient Age:    74 years     BP:           114/69 mmHg Patient Gender: M            HR:           90 bpm.  Exam Location:  [facility] Procedure: 2D Echo Indications:    Atrial Fibrillation 427.31 / I48.91  History:        Patient has no prior history of Echocardiogram examinations.                 Arrythmias:Atrial Fibrillation; Risk Factors:Former Smoker,                 Diabetes, Dyslipidemia and Hypertension. Ascending Aortic                 Aneurysm.  Sonographer:    Leavy Cella RDCS (AE) Referring Phys: [id number] DAVID MANUEL [place]  1. Poor acoustic windows limit study Would recomm limited echo and Definity to further define LVEF and wall motion. . Left ventricular ejection fraction, by estimation, is indeterminate.. There is mild left ventricular hypertrophy. Left ventricular diastolic parameters are indeterminate.  2. Right ventricular systolic function is normal. The right ventricular size is mildly enlarged. There is moderately elevated pulmonary artery systolic pressure.  3. Left atrial size was severely dilated.  4. Right atrial size was mildly dilated.  5. The mitral valve is normal in structure. Mild mitral valve regurgitation.  6. AV is thickened, calcified with mildly restricted motion Peak and mean gradients through the valve are approximately 27 and 16 mm Hg respectively consistent with mild AS. Marland Kitchen The aortic valve is tricuspid. Aortic valve regurgitation is not visualized.  7. The inferior vena cava is normal in size with greater than 50% respiratory variability, suggesting right atrial pressure of 3 mmHg. FINDINGS  Left Ventricle: Poor acoustic windows limit study Would recomm limited echo and Definity to further define LVEF and wall motion. Left ventricular ejection fraction, by estimation, is indeterminate%. The left ventricle has not fully assessed function. Left ventricular endocardial border not optimally defined to evaluate regional wall motion. The left ventricular internal cavity size was normal in size. There is mild left ventricular hypertrophy. Left ventricular diastolic  parameters are indeterminate. Right Ventricle: The right ventricular size is mildly enlarged. Right vetricular wall thickness was not assessed. Right ventricular systolic function is normal. There is moderately elevated pulmonary artery systolic pressure. The tricuspid regurgitant velocity is 3.45 m/s, and with an assumed right atrial pressure of 10 mmHg, the estimated right ventricular systolic pressure is 97.9 mmHg. Left Atrium: Left atrial size was severely dilated. Right Atrium: Right atrial size was mildly dilated. Pericardium: There is no evidence of pericardial effusion. Mitral Valve: The mitral valve is normal in structure. Mild mitral valve regurgitation. Tricuspid Valve: The tricuspid valve is normal in structure. Tricuspid valve regurgitation is mild. Aortic Valve: AV is thickened, calcified with mildly restricted motion Peak and mean gradients through the valve are approximately 27 and 16 mm Hg respectively consistent with mild AS. The aortic valve is tricuspid. Aortic valve regurgitation is not visualized. Pulmonic Valve: The pulmonic valve was not well visualized. Pulmonic valve regurgitation is not visualized. Aorta: The aortic root is normal in size and structure. Venous: The inferior vena cava is normal in size with greater than 50% respiratory  variability, suggesting right atrial pressure of 3 mmHg. IAS/Shunts: The interatrial septum was not assessed.  LEFT VENTRICLE PLAX 2D LVIDd:         4.78 cm  Diastology LVIDs:         3.99 cm  LV e' lateral:   11.20 cm/s LV PW:         1.12 cm  LV E/e' lateral: 10.0 LV IVS:        1.27 cm  LV e' medial:    9.03 cm/s LVOT diam:     2.10 cm  LV E/e' medial:  12.4 LVOT Area:     3.46 cm  RIGHT VENTRICLE RV S prime:     15.60 cm/s TAPSE (M-mode): 2.9 cm LEFT ATRIUM              Index LA diam:        5.10 cm  2.10 cm/m LA Vol (A2C):   131.0 ml 53.95 ml/m LA Vol (A4C):   124.0 ml 51.07 ml/m LA Biplane Vol: 134.0 ml 55.19 ml/m   AORTA Ao Root diam: 3.40 cm  MITRAL VALVE                TRICUSPID VALVE MV Area (PHT): 4.54 cm     TR Peak grad:   47.6 mmHg MV Decel Time: 167 msec     TR Vmax:        345.00 cm/s MR Peak grad: 112.4 mmHg MR Mean grad: 73.0 mmHg     SHUNTS MR Vmax:      530.00 cm/s   Systemic Diam: 2.10 cm MR Vmean:     406.0 cm/s MV E velocity: 112.00 cm/s MV A velocity: 33.80 cm/s MV E/A ratio:  3.31 Dorris Carnes MD Electronically signed by Dorris Carnes MD Signature Date/Time: 02/29/2020/5:18:50 PM    Final    ECHOCARDIOGRAM LIMITED  Result Date: 03/01/2020    ECHOCARDIOGRAM LIMITED REPORT   Patient Name:   BLANCHE GALLIEN Date of Exam: 03/01/2020 Medical Rec #:  [id number]    Height:       66.5 in Accession #:    [id number]   Weight:       302.2 lb Date of Birth:  11/12/1945     BSA:          2.397 m Patient Age:    57 years     BP:           110/51 mmHg Patient Gender: M            HR:           89 bpm. Exam Location:  [facility] Procedure: Limited Echo STAT ECHO Indications:    Preoperative evaluation  History:        Patient has prior history of Echocardiogram examinations, most                 recent 02/29/2020. Arrythmias:Atrial Fibrillation; Risk                 Factors:Hypertension, Diabetes and Dyslipidemia. Morbid obesity.  Sonographer:    Alvino Chapel RCS Referring Phys: [place]  1. Limited echo for EF. Normal LV size Mild LVH normal EF 65% with no RWMAls Definity used for endocardial border definition. FINDINGS  Left Ventricle: Definity contrast agent was given IV to delineate the left ventricular endocardial borders. Additional Comments: Limited echo for EF. Normal LV size Mild LVH normal EF 65% with no RWMAls Definity used for  endocardial border definition. LEFT VENTRICLE PLAX 2D LVIDd:         5.11 cm LVIDs:         3.56 cm LV PW:         1.11 cm LV IVS:        1.30 cm  LV Volumes (MOD) LV vol d, MOD A2C: 76.9 ml LV vol d, MOD A4C: 143.0 ml LV vol s, MOD A2C: 31.7 ml LV vol s, MOD A4C: 46.4 ml LV SV MOD A2C:     45.2 ml LV SV  MOD A4C:     143.0 ml LV SV MOD BP:      71.8 ml LEFT ATRIUM         Index LA diam:    4.00 cm 1.67 cm/m   AORTA Ao Root diam: 3.60 cm Marlyce Mcdougald Rouge MD Electronically signed by Toula Miyasaki Rouge MD Signature Date/Time: 03/01/2020/9:11:56 AM    Final    US Abdomen Limited RUQ  Result Date: 02/29/2020 CLINICAL DATA:  Abdominal pain. Nausea and vomiting. Cholelithiasis. EXAM: ULTRASOUND ABDOMEN LIMITED RIGHT UPPER QUADRANT COMPARISON:  None. FINDINGS: Gallbladder: Gallbladder is distended and contains multiple gallstones, largest measuring 1.7 cm. Diffuse gallbladder wall thickening is seen measuring up to 9 mm in thickness. These findings are consistent with acute cholecystitis. Common bile duct: Diameter: 4 mm, within normal limits. Liver: No focal lesion identified. Within normal limits in parenchymal echogenicity. Portal vein is patent on color Doppler imaging with normal direction of blood flow towards the liver. Other: None. IMPRESSION: Cholelithiasis with diffuse gallbladder wall thickening, consistent with acute cholecystitis. No evidence of biliary ductal dilatation. Electronically Signed   By: Marlaine Hind M.D.   On: 02/29/2020 09:22    Anti-infectives: Anti-infectives (From admission, onward)   Start     Dose/Rate Route Frequency Ordered Stop   02/29/20 0800  piperacillin-tazobactam (ZOSYN) IVPB 3.375 g     Discontinue     3.375 g 12.5 mL/hr over 240 Minutes Intravenous Every 8 hours 02/29/20 0719     02/29/20 0730  piperacillin-tazobactam (ZOSYN) IVPB 3.375 g  Status:  Discontinued        3.375 g 100 mL/hr over 30 Minutes Intravenous Every 8 hours 02/29/20 0718 02/29/20 0719   02/28/20 2230  piperacillin-tazobactam (ZOSYN) IVPB 3.375 g        3.375 g 100 mL/hr over 30 Minutes Intravenous  Once 02/28/20 2228 02/29/20 0522      Assessment/Plan: Impression: Acute cholecystitis secondary to cholelithiasis, new onset atrial fibrillation on heparin drip Plan: Patient has been cleared by  cardiology for surgery.  We will proceed with laparoscopic cholecystectomy tomorrow.  The risks and benefits of the procedure including bleeding, infection, hepatobiliary injury, and the possibility of an open procedure were fully explained to the patient, who gave informed consent.  We will stop heparin drip at midnight tonight.  Pharmacy has been informed.  LOS: 2 days    Aviva Signs 03/01/2020

## 2020-03-01 NOTE — Progress Notes (Signed)
ANTICOAGULATION CONSULT NOTE -   Pharmacy Consult for heparin Indication: atrial fibrillation  Allergies  Allergen Reactions  . Rofecoxib Other (See Comments)    (Vioxx)    Patient Measurements: Height: 5' 6.5" (168.9 cm) Weight: (!) 137.1 kg (302 lb 4 oz) IBW/kg (Calculated) : 64.95 Heparin Dosing Weight: 100.7 kg  Vital Signs: Temp: 98 F (36.7 C) (08/10 0357) BP: 110/51 (08/10 0357) Pulse Rate: 92 (08/10 0357)  Labs: Recent Labs    02/28/20 1545 02/28/20 1545 02/28/20 2237 02/29/20 0413 02/29/20 0819 02/29/20 1231 02/29/20 1611 03/01/20 0220 03/01/20 0943  HGB 14.6   < >  --  13.0  --   --   --  13.1  --   HCT 43.9  --   --  39.8  --   --   --  41.2  --   PLT 375  --   --  340  --   --   --  330  --   HEPARINUNFRC  --   --   --   --    < >  --  0.12* 0.33 0.30  CREATININE 2.13*   < >  --  1.60*  --  1.45*  --  1.10  --   TROPONINIHS  --   --  7  --   --   --   --   --   --    < > = values in this interval not displayed.    Estimated Creatinine Clearance: 75.8 mL/min (by C-G formula based on SCr of 1.1 mg/dL).   Medical History: Past Medical History:  Diagnosis Date  . Hyperlipidemia   . Hypertension   . Metabolic syndrome   . Metabolic syndrome   . Prediabetes     Medications:  See medication history  Assessment: 74 yo man to start heparin for afib.  He was not on anticoagulation PTA.  Plans are for cholecystectomy 8/11 and heparin will be held starting tonight at midnight.  Heparin level 0.30- therapeutic  Goal of Therapy:  Heparin level 0.3-0.7 units/ml Monitor platelets by anticoagulation protocol: Yes   Plan:  Continue heparin infusion at 1900 units/hr Check heparin level daily Monitor for bleeding complications  Margot Ables, PharmD Clinical Pharmacist 03/01/2020 11:06 AM

## 2020-03-01 NOTE — Progress Notes (Signed)
PROGRESS NOTE    Ryan Garner  WNU:[id number] DOB: 09-Dec-1945 DOA: 02/28/2020 PCP: [name], [name], MD    Brief Narrative:  HPI: Ryan Garner is a 74 y.o. male with medical history significant of hyperlipidemia, hypertension, type 2 diabetes, class III obesity with a BMI of 45.61 kg/m who is coming to the emergency department due to abdominal pain that started on Wednesday evening followed by 3 episodes of emesis that night, then several episodes of diarrhea since Thursday associated with fatigue, decreased appetite and abdominal pain.  He has been drinking fluids trying to stay hydrated as he could, but decided to come to the ED since his symptoms have not improved.  Not sick contacts or travel history.  Denies melena or hematochezia.  No dysuria, frequency or hematuria.  He denies fever, chills, sore throat, rhinorrhea, productive cough, wheezing or hemoptysis Denies chest pain, lightheadedness, dyspnea, palpitations, diaphoresis, PND, orthopnea or recent pitting edema of the lower extremities.  No polyuria, polydipsia, polyphagia or blurred vision.   Assessment & Plan:   Principal Problem:   Acute cholecystitis Active Problems:   Hypertension   Hyperlipidemia   Type 2 diabetes mellitus ([place])   Morbid obesity ([place])   AKI (acute kidney injury) ([place])   Hypokalemia   Hyponatremia   New onset atrial fibrillation ([place])   1. Acute cholecystitis.  Was seen by general surgery and plans are for cholecystectomy 8/11.  Continue on antibiotics for now. 2. New onset atrial fibrillation with rapid ventricular response.  Incidental finding, patient did not have any symptoms.  He was noted to be tachycardic which improved with metoprolol. CHADSVASc score of at least 4.  Started on heparin infusion.  This will need to be held prior to surgery.  Echocardiogram has been ordered.  Appreciate cardiology input. HR stable on metoprolol 3. Acute kidney injury.  Likely related to volume depletion with ACE  inhibitor use.  Hold nephrotoxic agents and continue IV fluids.  Monitor urine output. Creatinine improved from 2.1 on admission to 1.1 4. Hypokalemia.  Replace, Mg normal 5. Hyponatremia.  Secondary to GI losses/thiazide.  Continued on IV fluids. Improved. 6. Hypertension.  Holding hydrochlorothiazide and ACE inhibitor. Blood pressure currently stable 7. Prediabetes.  Diet controlled. A1c 6.0.  Follow blood sugars. 8. Morbid obesity.  BMI 45.  Recommended lifestyle modifications.   DVT prophylaxis: Heparin infusion  Code Status: Full code Family Communication: Discussed with patient Disposition Plan: Status is: Inpatient  Remains inpatient appropriate because:Ongoing diagnostic testing needed not appropriate for outpatient work up   Dispo: The patient is from: Home              Anticipated d/c is to: Home              Anticipated d/c date is: 2 days              Patient currently is not medically stable to d/c.   Consultants:   General surgery  Cardiology  Procedures:   Echo  Antimicrobials:   Zosyn 8/8 >   Subjective: No abdominal pain, no nausea or vomiting  Objective: Vitals:   02/29/20 2006 02/29/20 2030 03/01/20 0008 03/01/20 0357  BP: (!) 103/39  122/84 (!) 110/51  Pulse: 99  (!) 40 92  Resp: 20  20 20   Temp: 98.7 F (37.1 C)  98.2 F (36.8 C) 98 F (36.7 C)  TempSrc:      SpO2: 98% 97% 99% 98%  Weight:      Height:  Intake/Output Summary (Last 24 hours) at 03/01/2020 1053 Last data filed at 03/01/2020 0400 Gross per 24 hour  Intake 2122.2 ml  Output --  Net 2122.2 ml   Filed Weights   02/28/20 1436 02/29/20 1600  Weight: 136.1 kg (!) 137.1 kg    Examination:  General exam: Alert, awake, oriented x 3 Respiratory system: Clear to auscultation. Respiratory effort normal. Cardiovascular system:RRR. No murmurs, rubs, gallops. Gastrointestinal system: Abdomen is nondistended, soft and nontender. No organomegaly or masses felt. Normal  bowel sounds heard. Central nervous system: Alert and oriented. No focal neurological deficits. Extremities: No C/C/E, +pedal pulses Skin: No rashes, lesions or ulcers Psychiatry: Judgement and insight appear normal. Mood & affect appropriate.   Data Reviewed: I have personally reviewed following labs and imaging studies  CBC: Recent Labs  Lab 02/26/20 1643 02/28/20 1545 02/29/20 0413 03/01/20 0220  WBC 29.5* 11.9* 7.9 7.8  NEUTROABS 26.5*  --  5.9  --   HGB 15.8 14.6 13.0 13.1  HCT 45.8 43.9 39.8 41.2  MCV 86 88.0 88.6 90.0  PLT 347 375 340 716   Basic Metabolic Panel: Recent Labs  Lab 02/28/20 1545 02/28/20 2237 02/29/20 0413 02/29/20 1231 03/01/20 0220  NA 132*  --  136 137 136  K 3.3*  --  3.3* 3.5 3.2*  CL 96*  --  104 106 109  CO2 22  --  21* 22 22  GLUCOSE 159*  --  149* 134* 124*  BUN 77*  --  67* 56* 41*  CREATININE 2.13*  --  1.60* 1.45* 1.10  CALCIUM 9.1  --  8.2* 8.2* 7.9*  MG  --  2.2  --   --  2.1  PHOS  --  3.8  --   --   --    GFR: Estimated Creatinine Clearance: 75.8 mL/min (by C-G formula based on SCr of 1.1 mg/dL). Liver Function Tests: Recent Labs  Lab 02/28/20 1545 02/29/20 0413 03/01/20 0220  AST 28 17 16   ALT 28 23 22   ALKPHOS 61 52 53  BILITOT 0.9 0.9 0.5  PROT 7.3 6.1* 5.8*  ALBUMIN 3.4* 2.8* 2.6*   Recent Labs  Lab 02/28/20 1545  LIPASE 17   No results for input(s): AMMONIA in the last 168 hours. Coagulation Profile: No results for input(s): INR, PROTIME in the last 168 hours. Cardiac Enzymes: No results for input(s): CKTOTAL, CKMB, CKMBINDEX, TROPONINI in the last 168 hours. BNP (last 3 results) No results for input(s): PROBNP in the last 8760 hours. HbA1C: Recent Labs    03/01/20 0220  HGBA1C 6.0*   CBG: Recent Labs  Lab 02/29/20 0826 02/29/20 1650 02/29/20 2008 03/01/20 0723  GLUCAP 147* 134* 146* 119*   Lipid Profile: No results for input(s): CHOL, HDL, LDLCALC, TRIG, CHOLHDL, LDLDIRECT in the last 72  hours. Thyroid Function Tests: Recent Labs    02/28/20 1535  TSH 0.599   Anemia Panel: No results for input(s): VITAMINB12, FOLATE, FERRITIN, TIBC, IRON, RETICCTPCT in the last 72 hours. Sepsis Labs: Recent Labs  Lab 02/28/20 2237  LATICACIDVEN 1.2    Recent Results (from the past 240 hour(s))  Novel Coronavirus, NAA ([facility])     Status: None   Collection Time: 02/26/20 12:00 AM   Specimen: Nasopharyngeal(NP) swabs in vial transport medium   Nasopharynge  Is this  Result Value Ref Range Status   SARS-CoV-2, NAA Not Detected Not Detected Final    Comment: This nucleic acid amplification test was developed and its performance characteristics  determined by [employer]. Nucleic acid amplification tests include RT-PCR and TMA. This test has not been FDA cleared or approved. This test has been authorized by FDA under an Emergency Use Authorization (EUA). This test is only authorized for the duration of time the declaration that circumstances exist justifying the authorization of the emergency use of in vitro diagnostic tests for detection of SARS-CoV-2 virus and/or diagnosis of COVID-19 infection under section 564(b)(1) of the Act, 21 U.S.C. 267TIW-5(Y) (1), unless the authorization is terminated or revoked sooner. When diagnostic testing is negative, the possibility of a false negative result should be considered in the context of a patient's recent exposures and the presence of clinical signs and symptoms consistent with COVID-19. An individual without symptoms of COVID-19 and who is not shedding SARS-CoV-2 virus wo uld expect to have a negative (not detected) result in this assay.   SARS-COV-2, NAA 2 DAY TAT     Status: None   Collection Time: 02/26/20 12:00 AM   Nasopharynge  Is this  Result Value Ref Range Status   SARS-CoV-2, NAA 2 DAY TAT Performed  Final  SARS Coronavirus 2 by RT PCR (hospital order, performed in [place] hospital lab) Nasopharyngeal  Nasopharyngeal Swab     Status: None   Collection Time: 02/28/20  9:38 PM   Specimen: Nasopharyngeal Swab  Result Value Ref Range Status   SARS Coronavirus 2 NEGATIVE NEGATIVE Final    Comment: (NOTE) SARS-CoV-2 target nucleic acids are NOT DETECTED.  The SARS-CoV-2 RNA is generally detectable in upper and lower respiratory specimens during the acute phase of infection. The lowest concentration of SARS-CoV-2 viral copies this assay can detect is 250 copies / mL. A negative result does not preclude SARS-CoV-2 infection and should not be used as the sole basis for treatment or other patient management decisions.  A negative result may occur with improper specimen collection / handling, submission of specimen other than nasopharyngeal swab, presence of viral mutation(s) within the areas targeted by this assay, and inadequate number of viral copies (<250 copies / mL). A negative result must be combined with clinical observations, patient history, and epidemiological information.  Fact Sheet for Patients:   StrictlyIdeas.no  Fact Sheet for Healthcare Providers: BankingDealers.co.za  This test is not yet approved or  cleared by the [place] FDA and has been authorized for detection and/or diagnosis of SARS-CoV-2 by FDA under an Emergency Use Authorization (EUA).  This EUA will remain in effect (meaning this test can be used) for the duration of the COVID-19 declaration under Section 564(b)(1) of the Act, 21 U.S.C. section 360bbb-3(b)(1), unless the authorization is terminated or revoked sooner.  Performed at [facility], [address], [place], [place] [address]          Radiology Studies: CT ABDOMEN PELVIS WO CONTRAST  Result Date: 02/28/2020 CLINICAL DATA:  Nausea, vomiting.  Abdominal pain. EXAM: CT ABDOMEN AND PELVIS WITHOUT CONTRAST TECHNIQUE: Multidetector CT imaging of the abdomen and pelvis was performed following the  standard protocol without IV contrast. COMPARISON:  None. FINDINGS: Lower chest: Coronary artery calcifications. Scarring in the lung bases. No effusions. Hepatobiliary: Gallbladder is distended with surrounding inflammation most compatible with cholecystitis. Scattered calcifications in the liver. Pancreas: No focal abnormality or ductal dilatation. Spleen: No focal abnormality.  Normal size. Adrenals/Urinary Tract: No adrenal abnormality. No focal renal abnormality. No stones or hydronephrosis. Urinary bladder is unremarkable. Stomach/Bowel: Mildly prominent small bowel loops in the pelvis containing fluid and air-fluid levels. Fluid throughout the  colon. Distal small bowel loops are decompressed. This could reflect focal ileus or distal small bowel obstruction. Vascular/Lymphatic: Aortic atherosclerosis. No evidence of aneurysm or adenopathy. Reproductive: No visible focal abnormality. Other: No free fluid or free air. Umbilical and left periumbilical hernias noted containing fat. There is stranding within the fat suggest the possibility of incarceration. Left inguinal hernia contains fat. No free fluid or free air. Musculoskeletal: No acute bony abnormality. IMPRESSION: Distended gallbladder with surrounding inflammation most compatible with acute cholecystitis. No ductal dilatation seen. This could be further evaluated with right upper quadrant ultrasound if felt clinically indicated. Prominent small bowel loops with air-fluid levels. Distal small bowel loops are decompressed. This could reflect focal ileus or distal small bowel obstruction. Umbilical and left periumbilical hernias contain fat with stranding which could reflect incarceration. Left inguinal hernia contains fat. Electronically Signed   By: Rolm Baptise M.D.   On: 02/28/2020 22:19   DG Chest Portable 1 View  Result Date: 02/28/2020 CLINICAL DATA:  Nausea, vomiting EXAM: PORTABLE CHEST 1 VIEW COMPARISON:  10/15/2013 FINDINGS: Linear bibasilar  atelectasis. Heart is borderline in size. No effusions or acute bony abnormality. IMPRESSION: Bibasilar atelectasis. Electronically Signed   By: Rolm Baptise M.D.   On: 02/28/2020 21:52   ECHOCARDIOGRAM COMPLETE  Result Date: 02/29/2020    ECHOCARDIOGRAM REPORT   Patient Name:   TAMARION HAYMOND Date of Exam: 02/29/2020 Medical Rec #:  [id number]    Height:       68.0 in Accession #:    [id number]   Weight:       300.0 lb Date of Birth:  [birth year]     BSA:          2.428 m Patient Age:    4 years     BP:           114/69 mmHg Patient Gender: M            HR:           90 bpm. Exam Location:  [facility] Procedure: 2D Echo Indications:    Atrial Fibrillation 427.31 / I48.91  History:        Patient has no prior history of Echocardiogram examinations.                 Arrythmias:Atrial Fibrillation; Risk Factors:Former Smoker,                 Diabetes, Dyslipidemia and Hypertension. Ascending Aortic                 Aneurysm.  Sonographer:    Leavy Cella RDCS (AE) Referring Phys: [id number] DAVID MANUEL [place]  1. Poor acoustic windows limit study Would recomm limited echo and Definity to further define LVEF and wall motion. . Left ventricular ejection fraction, by estimation, is indeterminate.. There is mild left ventricular hypertrophy. Left ventricular diastolic parameters are indeterminate.  2. Right ventricular systolic function is normal. The right ventricular size is mildly enlarged. There is moderately elevated pulmonary artery systolic pressure.  3. Left atrial size was severely dilated.  4. Right atrial size was mildly dilated.  5. The mitral valve is normal in structure. Mild mitral valve regurgitation.  6. AV is thickened, calcified with mildly restricted motion Peak and mean gradients through the valve are approximately 27 and 16 mm Hg respectively consistent with mild AS. Marland Kitchen The aortic valve is tricuspid. Aortic valve regurgitation is not visualized.  7. The inferior vena cava is normal in size with  greater than 50% respiratory variability, suggesting right atrial pressure of 3 mmHg. FINDINGS  Left Ventricle: Poor acoustic windows limit study Would recomm limited echo and Definity to further define LVEF and wall motion. Left ventricular ejection fraction, by estimation, is indeterminate%. The left ventricle has not fully assessed function. Left ventricular endocardial border not optimally defined to evaluate regional wall motion. The left ventricular internal cavity size was normal in size. There is mild left ventricular hypertrophy. Left ventricular diastolic parameters are indeterminate. Right Ventricle: The right ventricular size is mildly enlarged. Right vetricular wall thickness was not assessed. Right ventricular systolic function is normal. There is moderately elevated pulmonary artery systolic pressure. The tricuspid regurgitant velocity is 3.45 m/s, and with an assumed right atrial pressure of 10 mmHg, the estimated right ventricular systolic pressure is 37.3 mmHg. Left Atrium: Left atrial size was severely dilated. Right Atrium: Right atrial size was mildly dilated. Pericardium: There is no evidence of pericardial effusion. Mitral Valve: The mitral valve is normal in structure. Mild mitral valve regurgitation. Tricuspid Valve: The tricuspid valve is normal in structure. Tricuspid valve regurgitation is mild. Aortic Valve: AV is thickened, calcified with mildly restricted motion Peak and mean gradients through the valve are approximately 27 and 16 mm Hg respectively consistent with mild AS. The aortic valve is tricuspid. Aortic valve regurgitation is not visualized. Pulmonic Valve: The pulmonic valve was not well visualized. Pulmonic valve regurgitation is not visualized. Aorta: The aortic root is normal in size and structure. Venous: The inferior vena cava is normal in size with greater than 50% respiratory variability, suggesting right atrial pressure of 3 mmHg. IAS/Shunts: The interatrial septum was  not assessed.  LEFT VENTRICLE PLAX 2D LVIDd:         4.78 cm  Diastology LVIDs:         3.99 cm  LV e' lateral:   11.20 cm/s LV PW:         1.12 cm  LV E/e' lateral: 10.0 LV IVS:        1.27 cm  LV e' medial:    9.03 cm/s LVOT diam:     2.10 cm  LV E/e' medial:  12.4 LVOT Area:     3.46 cm  RIGHT VENTRICLE RV S prime:     15.60 cm/s TAPSE (M-mode): 2.9 cm LEFT ATRIUM              Index LA diam:        5.10 cm  2.10 cm/m LA Vol (A2C):   131.0 ml 53.95 ml/m LA Vol (A4C):   124.0 ml 51.07 ml/m LA Biplane Vol: 134.0 ml 55.19 ml/m   AORTA Ao Root diam: 3.40 cm MITRAL VALVE                TRICUSPID VALVE MV Area (PHT): 4.54 cm     TR Peak grad:   47.6 mmHg MV Decel Time: 167 msec     TR Vmax:        345.00 cm/s MR Peak grad: 112.4 mmHg MR Mean grad: 73.0 mmHg     SHUNTS MR Vmax:      530.00 cm/s   Systemic Diam: 2.10 cm MR Vmean:     406.0 cm/s MV E velocity: 112.00 cm/s MV A velocity: 33.80 cm/s MV E/A ratio:  3.31 Dorris Carnes MD Electronically signed by Dorris Carnes MD Signature Date/Time: 02/29/2020/5:18:50 PM    Final    ECHOCARDIOGRAM LIMITED  Result Date: 03/01/2020    ECHOCARDIOGRAM LIMITED  REPORT   Patient Name:   DUEY LILLER Date of Exam: 03/01/2020 Medical Rec #:  [id number]    Height:       66.5 in Accession #:    [id number]   Weight:       302.2 lb Date of Birth:  [birth year]     BSA:          2.397 m Patient Age:    40 years     BP:           110/51 mmHg Patient Gender: M            HR:           89 bpm. Exam Location:  [facility] Procedure: Limited Echo STAT ECHO Indications:    Preoperative evaluation  History:        Patient has prior history of Echocardiogram examinations, most                 recent 02/29/2020. Arrythmias:Atrial Fibrillation; Risk                 Factors:Hypertension, Diabetes and Dyslipidemia. Morbid obesity.  Sonographer:    Alvino Chapel RCS Referring Phys: [place]  1. Limited echo for EF. Normal LV size Mild LVH normal EF 65% with no RWMAls Definity used for  endocardial border definition. FINDINGS  Left Ventricle: Definity contrast agent was given IV to delineate the left ventricular endocardial borders. Additional Comments: Limited echo for EF. Normal LV size Mild LVH normal EF 65% with no RWMAls Definity used for endocardial border definition. LEFT VENTRICLE PLAX 2D LVIDd:         5.11 cm LVIDs:         3.56 cm LV PW:         1.11 cm LV IVS:        1.30 cm  LV Volumes (MOD) LV vol d, MOD A2C: 76.9 ml LV vol d, MOD A4C: 143.0 ml LV vol s, MOD A2C: 31.7 ml LV vol s, MOD A4C: 46.4 ml LV SV MOD A2C:     45.2 ml LV SV MOD A4C:     143.0 ml LV SV MOD BP:      71.8 ml LEFT ATRIUM         Index LA diam:    4.00 cm 1.67 cm/m   AORTA Ao Root diam: 3.60 cm Jenkins Rouge MD Electronically signed by Jenkins Rouge MD Signature Date/Time: 03/01/2020/9:11:56 AM    Final    US Abdomen Limited RUQ  Result Date: 02/29/2020 CLINICAL DATA:  Abdominal pain. Nausea and vomiting. Cholelithiasis. EXAM: ULTRASOUND ABDOMEN LIMITED RIGHT UPPER QUADRANT COMPARISON:  None. FINDINGS: Gallbladder: Gallbladder is distended and contains multiple gallstones, largest measuring 1.7 cm. Diffuse gallbladder wall thickening is seen measuring up to 9 mm in thickness. These findings are consistent with acute cholecystitis. Common bile duct: Diameter: 4 mm, within normal limits. Liver: No focal lesion identified. Within normal limits in parenchymal echogenicity. Portal vein is patent on color Doppler imaging with normal direction of blood flow towards the liver. Other: None. IMPRESSION: Cholelithiasis with diffuse gallbladder wall thickening, consistent with acute cholecystitis. No evidence of biliary ductal dilatation. Electronically Signed   By: Marlaine Hind M.D.   On: 02/29/2020 09:22        Scheduled Meds: . metoprolol tartrate  12.5 mg Oral BID   Continuous Infusions: . heparin 1,900 Units/hr (02/29/20 1743)  . piperacillin-tazobactam (ZOSYN)  IV 3.375 g (03/01/20  [place])  . potassium chloride        LOS: 2 days    Time spent: 66mins    Kathie Dike, MD [facility]   If 7PM-7AM, please contact night-coverage [email or web address]  03/01/2020, 10:53 AM

## 2020-03-01 NOTE — Progress Notes (Signed)
Subjective:  Denies SSCP, palpitations or Dyspnea ? Surgery tomorrow some diarrhea   Objective:  Vitals:   02/29/20 2006 02/29/20 2030 03/01/20 0008 03/01/20 0357  BP: (!) 103/39  122/84 (!) 110/51  Pulse: 99  (!) 40 92  Resp: 20  20 20   Temp: 98.7 F (37.1 C)  98.2 F (36.8 C) 98 F (36.7 C)  TempSrc:      SpO2: 98% 97% 99% 98%  Weight:      Height:        Intake/Output from previous day:  Intake/Output Summary (Last 24 hours) at 03/01/2020 0825 Last data filed at 03/01/2020 0400 Gross per 24 hour  Intake 2122.2 ml  Output --  Net 2122.2 ml    Physical Exam: Affect appropriate Obese male  HEENT: normal Neck supple with no adenopathy JVP normal no bruits no thyromegaly Lungs clear with no wheezing and good diaphragmatic motion Heart:  S1/S2 no murmur, no rub, gallop or click PMI normal Abdomen: benighn, BS positve, no tenderness, no AAA no bruit.  No HSM or HJR Distal pulses intact with no bruits No edema Neuro non-focal Skin warm and dry No muscular weakness   Lab Results: Basic Metabolic Panel: Recent Labs    02/28/20 2237 02/29/20 0413 02/29/20 1231 03/01/20 0220  NA  --    < > 137 136  K  --    < > 3.5 3.2*  CL  --    < > 106 109  CO2  --    < > 22 22  GLUCOSE  --    < > 134* 124*  BUN  --    < > 56* 41*  CREATININE  --    < > 1.45* 1.10  CALCIUM  --    < > 8.2* 7.9*  MG 2.2  --   --  2.1  PHOS 3.8  --   --   --    < > = values in this interval not displayed.   Liver Function Tests: Recent Labs    02/29/20 0413 03/01/20 0220  AST 17 16  ALT 23 22  ALKPHOS 52 53  BILITOT 0.9 0.5  PROT 6.1* 5.8*  ALBUMIN 2.8* 2.6*   Recent Labs    02/28/20 1545  LIPASE 17   CBC: Recent Labs    02/29/20 0413 03/01/20 0220  WBC 7.9 7.8  NEUTROABS 5.9  --   HGB 13.0 13.1  HCT 39.8 41.2  MCV 88.6 90.0  PLT 340 330   Cardiac Enzymes: No results for input(s): CKTOTAL, CKMB, CKMBINDEX, TROPONINI in the last 72 hours. BNP: Invalid  input(s): POCBNP D-Dimer: No results for input(s): DDIMER in the last 72 hours. Hemoglobin A1C: No results for input(s): HGBA1C in the last 72 hours. Fasting Lipid Panel: No results for input(s): CHOL, HDL, LDLCALC, TRIG, CHOLHDL, LDLDIRECT in the last 72 hours. Thyroid Function Tests: Recent Labs    02/28/20 1535  TSH 0.599   Anemia Panel: No results for input(s): VITAMINB12, FOLATE, FERRITIN, TIBC, IRON, RETICCTPCT in the last 72 hours.  Imaging: CT ABDOMEN PELVIS WO CONTRAST  Result Date: 02/28/2020 CLINICAL DATA:  Nausea, vomiting.  Abdominal pain. EXAM: CT ABDOMEN AND PELVIS WITHOUT CONTRAST TECHNIQUE: Multidetector CT imaging of the abdomen and pelvis was performed following the standard protocol without IV contrast. COMPARISON:  None. FINDINGS: Lower chest: Coronary artery calcifications. Scarring in the lung bases. No effusions. Hepatobiliary: Gallbladder is distended with surrounding inflammation most compatible with cholecystitis. Scattered calcifications in  the liver. Pancreas: No focal abnormality or ductal dilatation. Spleen: No focal abnormality.  Normal size. Adrenals/Urinary Tract: No adrenal abnormality. No focal renal abnormality. No stones or hydronephrosis. Urinary bladder is unremarkable. Stomach/Bowel: Mildly prominent small bowel loops in the pelvis containing fluid and air-fluid levels. Fluid throughout the colon. Distal small bowel loops are decompressed. This could reflect focal ileus or distal small bowel obstruction. Vascular/Lymphatic: Aortic atherosclerosis. No evidence of aneurysm or adenopathy. Reproductive: No visible focal abnormality. Other: No free fluid or free air. Umbilical and left periumbilical hernias noted containing fat. There is stranding within the fat suggest the possibility of incarceration. Left inguinal hernia contains fat. No free fluid or free air. Musculoskeletal: No acute bony abnormality. IMPRESSION: Distended gallbladder with surrounding  inflammation most compatible with acute cholecystitis. No ductal dilatation seen. This could be further evaluated with right upper quadrant ultrasound if felt clinically indicated. Prominent small bowel loops with air-fluid levels. Distal small bowel loops are decompressed. This could reflect focal ileus or distal small bowel obstruction. Umbilical and left periumbilical hernias contain fat with stranding which could reflect incarceration. Left inguinal hernia contains fat. Electronically Signed   By: Rolm Baptise M.D.   On: 02/28/2020 22:19   DG Chest Portable 1 View  Result Date: 02/28/2020 CLINICAL DATA:  Nausea, vomiting EXAM: PORTABLE CHEST 1 VIEW COMPARISON:  10/15/2013 FINDINGS: Linear bibasilar atelectasis. Heart is borderline in size. No effusions or acute bony abnormality. IMPRESSION: Bibasilar atelectasis. Electronically Signed   By: Rolm Baptise M.D.   On: 02/28/2020 21:52   ECHOCARDIOGRAM COMPLETE  Result Date: 02/29/2020    ECHOCARDIOGRAM REPORT   Patient Name:   Ryan Garner Date of Exam: 02/29/2020 Medical Rec #:  [id number]    Height:       68.0 in Accession #:    [id number]   Weight:       300.0 lb Date of Birth:  1946/05/24     BSA:          2.428 m Patient Age:    74 years     BP:           114/69 mmHg Patient Gender: M            HR:           90 bpm. Exam Location:  [facility] Procedure: 2D Echo Indications:    Atrial Fibrillation 427.31 / I48.91  History:        Patient has no prior history of Echocardiogram examinations.                 Arrythmias:Atrial Fibrillation; Risk Factors:Former Smoker,                 Diabetes, Dyslipidemia and Hypertension. Ascending Aortic                 Aneurysm.  Sonographer:    Leavy Cella RDCS (AE) Referring Phys: [id number] DAVID MANUEL [place]  1. Poor acoustic windows limit study Would recomm limited echo and Definity to further define LVEF and wall motion. . Left ventricular ejection fraction, by estimation, is indeterminate.. There is mild  left ventricular hypertrophy. Left ventricular diastolic parameters are indeterminate.  2. Right ventricular systolic function is normal. The right ventricular size is mildly enlarged. There is moderately elevated pulmonary artery systolic pressure.  3. Left atrial size was severely dilated.  4. Right atrial size was mildly dilated.  5. The mitral valve is normal in structure. Mild mitral valve regurgitation.  6. AV is thickened, calcified with mildly restricted motion Peak and mean gradients through the valve are approximately 27 and 16 mm Hg respectively consistent with mild AS. Marland Kitchen The aortic valve is tricuspid. Aortic valve regurgitation is not visualized.  7. The inferior vena cava is normal in size with greater than 50% respiratory variability, suggesting right atrial pressure of 3 mmHg. FINDINGS  Left Ventricle: Poor acoustic windows limit study Would recomm limited echo and Definity to further define LVEF and wall motion. Left ventricular ejection fraction, by estimation, is indeterminate%. The left ventricle has not fully assessed function. Left ventricular endocardial border not optimally defined to evaluate regional wall motion. The left ventricular internal cavity size was normal in size. There is mild left ventricular hypertrophy. Left ventricular diastolic parameters are indeterminate. Right Ventricle: The right ventricular size is mildly enlarged. Right vetricular wall thickness was not assessed. Right ventricular systolic function is normal. There is moderately elevated pulmonary artery systolic pressure. The tricuspid regurgitant velocity is 3.45 m/s, and with an assumed right atrial pressure of 10 mmHg, the estimated right ventricular systolic pressure is 16.1 mmHg. Left Atrium: Left atrial size was severely dilated. Right Atrium: Right atrial size was mildly dilated. Pericardium: There is no evidence of pericardial effusion. Mitral Valve: The mitral valve is normal in structure. Mild mitral valve  regurgitation. Tricuspid Valve: The tricuspid valve is normal in structure. Tricuspid valve regurgitation is mild. Aortic Valve: AV is thickened, calcified with mildly restricted motion Peak and mean gradients through the valve are approximately 27 and 16 mm Hg respectively consistent with mild AS. The aortic valve is tricuspid. Aortic valve regurgitation is not visualized. Pulmonic Valve: The pulmonic valve was not well visualized. Pulmonic valve regurgitation is not visualized. Aorta: The aortic root is normal in size and structure. Venous: The inferior vena cava is normal in size with greater than 50% respiratory variability, suggesting right atrial pressure of 3 mmHg. IAS/Shunts: The interatrial septum was not assessed.  LEFT VENTRICLE PLAX 2D LVIDd:         4.78 cm  Diastology LVIDs:         3.99 cm  LV e' lateral:   11.20 cm/s LV PW:         1.12 cm  LV E/e' lateral: 10.0 LV IVS:        1.27 cm  LV e' medial:    9.03 cm/s LVOT diam:     2.10 cm  LV E/e' medial:  12.4 LVOT Area:     3.46 cm  RIGHT VENTRICLE RV S prime:     15.60 cm/s TAPSE (M-mode): 2.9 cm LEFT ATRIUM              Index LA diam:        5.10 cm  2.10 cm/m LA Vol (A2C):   131.0 ml 53.95 ml/m LA Vol (A4C):   124.0 ml 51.07 ml/m LA Biplane Vol: 134.0 ml 55.19 ml/m   AORTA Ao Root diam: 3.40 cm MITRAL VALVE                TRICUSPID VALVE MV Area (PHT): 4.54 cm     TR Peak grad:   47.6 mmHg MV Decel Time: 167 msec     TR Vmax:        345.00 cm/s MR Peak grad: 112.4 mmHg MR Mean grad: 73.0 mmHg     SHUNTS MR Vmax:      530.00 cm/s   Systemic Diam: 2.10 cm MR Vmean:  406.0 cm/s MV E velocity: 112.00 cm/s MV A velocity: 33.80 cm/s MV E/A ratio:  3.31 Dorris Carnes MD Electronically signed by Dorris Carnes MD Signature Date/Time: 02/29/2020/5:18:50 PM    Final    US Abdomen Limited RUQ  Result Date: 02/29/2020 CLINICAL DATA:  Abdominal pain. Nausea and vomiting. Cholelithiasis. EXAM: ULTRASOUND ABDOMEN LIMITED RIGHT UPPER QUADRANT COMPARISON:  None.  FINDINGS: Gallbladder: Gallbladder is distended and contains multiple gallstones, largest measuring 1.7 cm. Diffuse gallbladder wall thickening is seen measuring up to 9 mm in thickness. These findings are consistent with acute cholecystitis. Common bile duct: Diameter: 4 mm, within normal limits. Liver: No focal lesion identified. Within normal limits in parenchymal echogenicity. Portal vein is patent on color Doppler imaging with normal direction of blood flow towards the liver. Other: None. IMPRESSION: Cholelithiasis with diffuse gallbladder wall thickening, consistent with acute cholecystitis. No evidence of biliary ductal dilatation. Electronically Signed   By: Marlaine Hind M.D.   On: 02/29/2020 09:22    Cardiac Studies:  ECG:   afib rate 118 nonspecific ST chagnes    Telemetry: afib  Echo: No valve disease unable to see endocardium f/u with definity ordered   Medications:   . metoprolol tartrate  12.5 mg Oral BID     . heparin 1,900 Units/hr (02/29/20 1743)  . piperacillin-tazobactam (ZOSYN)  IV 3.375 g (02/29/20 2341)    Assessment/Plan:   1. Afib:  New onset on heparin can transition to eliquis post op. Give oral am dose of lopressor prior to surgery and can rate control with iv cardizem post op until takes PO. Stop heparin at least 1 hour before surgery Getting echo with definity for EF assessment this but ok for surgery as his functional status is good and no history of CAD 2. GB:  Continue Zosyn less pain some diarrhea surgery in am   Jenkins Rouge 03/01/2020, 8:25 AM

## 2020-03-01 NOTE — Progress Notes (Signed)
*  PRELIMINARY RESULTS* Echocardiogram Limited 2-D Echocardiogram has been performed with Definity.  Samuel Germany 03/01/2020, 8:54 AM

## 2020-03-01 NOTE — Progress Notes (Signed)
Subjective: Patient has no abdominal pain.  Tolerating diet well.  Objective: Vital signs in last 24 hours: Temp:  [98 F (36.7 C)-98.7 F (37.1 C)] 98 F (36.7 C) (08/10 0357) Pulse Rate:  [40-103] 92 (08/10 0357) Resp:  [14-20] 20 (08/10 0357) BP: (103-122)/(39-84) 110/51 (08/10 0357) SpO2:  [96 %-99 %] 98 % (08/10 0357) Weight:  [137.1 kg] 137.1 kg (08/09 1600) Last BM Date: 02/29/20  Intake/Output from previous day: 08/09 0701 - 08/10 0700 In: 2122.2 [P.O.:240; I.V.:1764.5; IV Piggyback:117.7] Out: -  Intake/Output this shift: No intake/output data recorded.  General appearance: alert, cooperative and no distress GI: soft, non-tender; bowel sounds normal; no masses,  no organomegaly  Lab Results:  Recent Labs    02/29/20 0413 03/01/20 0220  WBC 7.9 7.8  HGB 13.0 13.1  HCT 39.8 41.2  PLT 340 330   BMET Recent Labs    02/29/20 1231 03/01/20 0220  NA 137 136  K 3.5 3.2*  CL 106 109  CO2 22 22  GLUCOSE 134* 124*  BUN 56* 41*  CREATININE 1.45* 1.10  CALCIUM 8.2* 7.9*   PT/INR No results for input(s): LABPROT, INR in the last 72 hours.  Studies/Results: CT ABDOMEN PELVIS WO CONTRAST  Result Date: 02/28/2020 CLINICAL DATA:  Nausea, vomiting.  Abdominal pain. EXAM: CT ABDOMEN AND PELVIS WITHOUT CONTRAST TECHNIQUE: Multidetector CT imaging of the abdomen and pelvis was performed following the standard protocol without IV contrast. COMPARISON:  None. FINDINGS: Lower chest: Coronary artery calcifications. Scarring in the lung bases. No effusions. Hepatobiliary: Gallbladder is distended with surrounding inflammation most compatible with cholecystitis. Scattered calcifications in the liver. Pancreas: No focal abnormality or ductal dilatation. Spleen: No focal abnormality.  Normal size. Adrenals/Urinary Tract: No adrenal abnormality. No focal renal abnormality. No stones or hydronephrosis. Urinary bladder is unremarkable. Stomach/Bowel: Mildly prominent small bowel  loops in the pelvis containing fluid and air-fluid levels. Fluid throughout the colon. Distal small bowel loops are decompressed. This could reflect focal ileus or distal small bowel obstruction. Vascular/Lymphatic: Aortic atherosclerosis. No evidence of aneurysm or adenopathy. Reproductive: No visible focal abnormality. Other: No free fluid or free air. Umbilical and left periumbilical hernias noted containing fat. There is stranding within the fat suggest the possibility of incarceration. Left inguinal hernia contains fat. No free fluid or free air. Musculoskeletal: No acute bony abnormality. IMPRESSION: Distended gallbladder with surrounding inflammation most compatible with acute cholecystitis. No ductal dilatation seen. This could be further evaluated with right upper quadrant ultrasound if felt clinically indicated. Prominent small bowel loops with air-fluid levels. Distal small bowel loops are decompressed. This could reflect focal ileus or distal small bowel obstruction. Umbilical and left periumbilical hernias contain fat with stranding which could reflect incarceration. Left inguinal hernia contains fat. Electronically Signed   By: Rolm Baptise M.D.   On: 02/28/2020 22:19   DG Chest Portable 1 View  Result Date: 02/28/2020 CLINICAL DATA:  Nausea, vomiting EXAM: PORTABLE CHEST 1 VIEW COMPARISON:  10/15/2013 FINDINGS: Linear bibasilar atelectasis. Heart is borderline in size. No effusions or acute bony abnormality. IMPRESSION: Bibasilar atelectasis. Electronically Signed   By: Rolm Baptise M.D.   On: 02/28/2020 21:52   ECHOCARDIOGRAM COMPLETE  Result Date: 02/29/2020    ECHOCARDIOGRAM REPORT   Patient Name:   Ryan Garner Date of Exam: 02/29/2020 Medical Rec #:  [id number]    Height:       68.0 in Accession #:    [id number]   Weight:       300.0  lb Date of Birth:  [birth year]     BSA:          2.428 m Patient Age:    74 years     BP:           114/69 mmHg Patient Gender: M            HR:           90 bpm.  Exam Location:  [facility] Procedure: 2D Echo Indications:    Atrial Fibrillation 427.31 / I48.91  History:        Patient has no prior history of Echocardiogram examinations.                 Arrythmias:Atrial Fibrillation; Risk Factors:Former Smoker,                 Diabetes, Dyslipidemia and Hypertension. Ascending Aortic                 Aneurysm.  Sonographer:    Leavy Cella RDCS (AE) Referring Phys: [id number] DAVID MANUEL [place]  1. Poor acoustic windows limit study Would recomm limited echo and Definity to further define LVEF and wall motion. . Left ventricular ejection fraction, by estimation, is indeterminate.. There is mild left ventricular hypertrophy. Left ventricular diastolic parameters are indeterminate.  2. Right ventricular systolic function is normal. The right ventricular size is mildly enlarged. There is moderately elevated pulmonary artery systolic pressure.  3. Left atrial size was severely dilated.  4. Right atrial size was mildly dilated.  5. The mitral valve is normal in structure. Mild mitral valve regurgitation.  6. AV is thickened, calcified with mildly restricted motion Peak and mean gradients through the valve are approximately 27 and 16 mm Hg respectively consistent with mild AS. Marland Kitchen The aortic valve is tricuspid. Aortic valve regurgitation is not visualized.  7. The inferior vena cava is normal in size with greater than 50% respiratory variability, suggesting right atrial pressure of 3 mmHg. FINDINGS  Left Ventricle: Poor acoustic windows limit study Would recomm limited echo and Definity to further define LVEF and wall motion. Left ventricular ejection fraction, by estimation, is indeterminate%. The left ventricle has not fully assessed function. Left ventricular endocardial border not optimally defined to evaluate regional wall motion. The left ventricular internal cavity size was normal in size. There is mild left ventricular hypertrophy. Left ventricular diastolic  parameters are indeterminate. Right Ventricle: The right ventricular size is mildly enlarged. Right vetricular wall thickness was not assessed. Right ventricular systolic function is normal. There is moderately elevated pulmonary artery systolic pressure. The tricuspid regurgitant velocity is 3.45 m/s, and with an assumed right atrial pressure of 10 mmHg, the estimated right ventricular systolic pressure is 56.9 mmHg. Left Atrium: Left atrial size was severely dilated. Right Atrium: Right atrial size was mildly dilated. Pericardium: There is no evidence of pericardial effusion. Mitral Valve: The mitral valve is normal in structure. Mild mitral valve regurgitation. Tricuspid Valve: The tricuspid valve is normal in structure. Tricuspid valve regurgitation is mild. Aortic Valve: AV is thickened, calcified with mildly restricted motion Peak and mean gradients through the valve are approximately 27 and 16 mm Hg respectively consistent with mild AS. The aortic valve is tricuspid. Aortic valve regurgitation is not visualized. Pulmonic Valve: The pulmonic valve was not well visualized. Pulmonic valve regurgitation is not visualized. Aorta: The aortic root is normal in size and structure. Venous: The inferior vena cava is normal in size with greater than 50% respiratory  variability, suggesting right atrial pressure of 3 mmHg. IAS/Shunts: The interatrial septum was not assessed.  LEFT VENTRICLE PLAX 2D LVIDd:         4.78 cm  Diastology LVIDs:         3.99 cm  LV e' lateral:   11.20 cm/s LV PW:         1.12 cm  LV E/e' lateral: 10.0 LV IVS:        1.27 cm  LV e' medial:    9.03 cm/s LVOT diam:     2.10 cm  LV E/e' medial:  12.4 LVOT Area:     3.46 cm  RIGHT VENTRICLE RV S prime:     15.60 cm/s TAPSE (M-mode): 2.9 cm LEFT ATRIUM              Index LA diam:        5.10 cm  2.10 cm/m LA Vol (A2C):   131.0 ml 53.95 ml/m LA Vol (A4C):   124.0 ml 51.07 ml/m LA Biplane Vol: 134.0 ml 55.19 ml/m   AORTA Ao Root diam: 3.40 cm  MITRAL VALVE                TRICUSPID VALVE MV Area (PHT): 4.54 cm     TR Peak grad:   47.6 mmHg MV Decel Time: 167 msec     TR Vmax:        345.00 cm/s MR Peak grad: 112.4 mmHg MR Mean grad: 73.0 mmHg     SHUNTS MR Vmax:      530.00 cm/s   Systemic Diam: 2.10 cm MR Vmean:     406.0 cm/s MV E velocity: 112.00 cm/s MV A velocity: 33.80 cm/s MV E/A ratio:  3.31 Dorris Carnes MD Electronically signed by Dorris Carnes MD Signature Date/Time: 02/29/2020/5:18:50 PM    Final    ECHOCARDIOGRAM LIMITED  Result Date: 03/01/2020    ECHOCARDIOGRAM LIMITED REPORT   Patient Name:   Ryan Garner Date of Exam: 03/01/2020 Medical Rec #:  [id number]    Height:       66.5 in Accession #:    [id number]   Weight:       302.2 lb Date of Birth:  [birth year]     BSA:          2.397 m Patient Age:    74 years     BP:           110/51 mmHg Patient Gender: M            HR:           89 bpm. [facility]am Location:  [facility] Procedure: Limited Echo STAT ECHO Indications:    Preoperative evaluation  History:        Patient has prior history of Echocardiogram examinations, most                 recent 02/29/2020. Arrythmias:Atrial Fibrillation; Risk                 Factors:Hypertension, Diabetes and Dyslipidemia. Morbid obesity.  Sonographer:    Alvino Chapel RCS Referring Phys: [place]  1. Limited echo for EF. Normal LV size Mild LVH normal EF 65% with no RWMAls Definity used for endocardial border definition. FINDINGS  Left Ventricle: Definity contrast agent was given IV to delineate the left ventricular endocardial borders. Additional Comments: Limited echo for EF. Normal LV size Mild LVH normal EF 65% with no RWMAls Definity used for  endocardial border definition. LEFT VENTRICLE PLAX 2D LVIDd:         5.11 cm LVIDs:         3.56 cm LV PW:         1.11 cm LV IVS:        1.30 cm  LV Volumes (MOD) LV vol d, MOD A2C: 76.9 ml LV vol d, MOD A4C: 143.0 ml LV vol s, MOD A2C: 31.7 ml LV vol s, MOD A4C: 46.4 ml LV SV MOD A2C:     45.2 ml LV SV  MOD A4C:     143.0 ml LV SV MOD BP:      71.8 ml LEFT ATRIUM         Index LA diam:    4.00 cm 1.67 cm/m   AORTA Ao Root diam: 3.60 cm Renuka Farfan Rouge MD Electronically signed by Cleven Jansma Rouge MD Signature Date/Time: 03/01/2020/9:11:56 AM    Final    US Abdomen Limited RUQ  Result Date: 02/29/2020 CLINICAL DATA:  Abdominal pain. Nausea and vomiting. Cholelithiasis. EXAM: ULTRASOUND ABDOMEN LIMITED RIGHT UPPER QUADRANT COMPARISON:  None. FINDINGS: Gallbladder: Gallbladder is distended and contains multiple gallstones, largest measuring 1.7 cm. Diffuse gallbladder wall thickening is seen measuring up to 9 mm in thickness. These findings are consistent with acute cholecystitis. Common bile duct: Diameter: 4 mm, within normal limits. Liver: No focal lesion identified. Within normal limits in parenchymal echogenicity. Portal vein is patent on color Doppler imaging with normal direction of blood flow towards the liver. Other: None. IMPRESSION: Cholelithiasis with diffuse gallbladder wall thickening, consistent with acute cholecystitis. No evidence of biliary ductal dilatation. Electronically Signed   By: Marlaine Hind M.D.   On: 02/29/2020 09:22    Anti-infectives: Anti-infectives (From admission, onward)   Start     Dose/Rate Route Frequency Ordered Stop   02/29/20 0800  piperacillin-tazobactam (ZOSYN) IVPB 3.375 g     Discontinue     3.375 g 12.5 mL/hr over 240 Minutes Intravenous Every 8 hours 02/29/20 0719     02/29/20 0730  piperacillin-tazobactam (ZOSYN) IVPB 3.375 g  Status:  Discontinued        3.375 g 100 mL/hr over 30 Minutes Intravenous Every 8 hours 02/29/20 0718 02/29/20 0719   02/28/20 2230  piperacillin-tazobactam (ZOSYN) IVPB 3.375 g        3.375 g 100 mL/hr over 30 Minutes Intravenous  Once 02/28/20 2228 02/29/20 0522      Assessment/Plan: Impression: Acute cholecystitis secondary to cholelithiasis, new onset atrial fibrillation on heparin drip Plan: Patient has been cleared by  cardiology for surgery.  We will proceed with laparoscopic cholecystectomy tomorrow.  The risks and benefits of the procedure including bleeding, infection, hepatobiliary injury, and the possibility of an open procedure were fully explained to the patient, who gave informed consent.  We will stop heparin drip at midnight tonight.  Pharmacy has been informed.  LOS: 2 days    Aviva Signs 03/01/2020

## 2020-03-02 ENCOUNTER — Encounter (HOSPITAL_COMMUNITY): Payer: Self-pay | Admitting: Internal Medicine

## 2020-03-02 ENCOUNTER — Encounter (HOSPITAL_COMMUNITY)
Admission: EM | Disposition: A | Discharge status: Home / Self Care | Payer: Self-pay | Source: Home / Self Care | Attending: Internal Medicine

## 2020-03-02 ENCOUNTER — Inpatient Hospital Stay (HOSPITAL_COMMUNITY): Payer: Medicare Other | Admitting: Certified Registered Nurse Anesthetist

## 2020-03-02 ENCOUNTER — Other Ambulatory Visit: Payer: Self-pay

## 2020-03-02 DIAGNOSIS — K810 Acute cholecystitis: Secondary | ICD-10-CM

## 2020-03-02 HISTORY — PX: CHOLECYSTECTOMY: SHX55

## 2020-03-02 LAB — COMPREHENSIVE METABOLIC PANEL
ALT: 21 U/L (ref 0–44)
AST: 13 U/L — ABNORMAL LOW (ref 15–41)
Albumin: 2.5 g/dL — ABNORMAL LOW (ref 3.5–5.0)
Alkaline Phosphatase: 48 U/L (ref 38–126)
Anion gap: 9 (ref 5–15)
BUN: 25 mg/dL — ABNORMAL HIGH (ref 8–23)
CO2: 20 mmol/L — ABNORMAL LOW (ref 22–32)
Calcium: 8.3 mg/dL — ABNORMAL LOW (ref 8.9–10.3)
Chloride: 107 mmol/L (ref 98–111)
Creatinine, Ser: 0.92 mg/dL (ref 0.61–1.24)
GFR calc Af Amer: >60 mL/min (ref >60)
GFR calc non Af Amer: >60 mL/min (ref >60)
Glucose, Bld: 120 mg/dL — ABNORMAL HIGH (ref 70–99)
Potassium: 3.4 mmol/L — ABNORMAL LOW (ref 3.5–5.1)
Sodium: 136 mmol/L (ref 135–145)
Total Bilirubin: 0.5 mg/dL (ref 0.3–1.2)
Total Protein: 5.4 g/dL — ABNORMAL LOW (ref 6.5–8.1)

## 2020-03-02 LAB — CBC
HCT: 40.1 % (ref 39.0–52.0)
Hemoglobin: 13 g/dL (ref 13.0–17.0)
MCH: 29.3 pg (ref 26.0–34.0)
MCHC: 32.4 g/dL (ref 30.0–36.0)
MCV: 90.5 fL (ref 80.0–100.0)
Platelets: 312 10*3/uL (ref 150–400)
RBC: 4.43 MIL/uL (ref 4.22–5.81)
RDW: 12.4 % (ref 11.5–15.5)
WBC: 9.1 10*3/uL (ref 4.0–10.5)
nRBC: 0 % (ref 0.0–0.2)

## 2020-03-02 LAB — GLUCOSE, CAPILLARY
Glucose-Capillary: 128 mg/dL — ABNORMAL HIGH (ref 70–99)
Glucose-Capillary: 102 mg/dL — ABNORMAL HIGH (ref 70–99)
Glucose-Capillary: 118 mg/dL — ABNORMAL HIGH (ref 70–99)
Glucose-Capillary: 182 mg/dL — ABNORMAL HIGH (ref 70–99)
Glucose-Capillary: 165 mg/dL — ABNORMAL HIGH (ref 70–99)

## 2020-03-02 SURGERY — LAPAROSCOPIC CHOLECYSTECTOMY
Anesthesia: General

## 2020-03-02 MED ORDER — ONDANSETRON HCL 4 MG/2ML IJ SOLN
INTRAMUSCULAR | Status: DC | PRN
  Administered 2020-03-02: 4 mg via INTRAVENOUS

## 2020-03-02 MED ORDER — SODIUM CHLORIDE 0.9 % IV SOLN: INTRAVENOUS | Status: DC

## 2020-03-02 MED ORDER — FENTANYL CITRATE (PF) 100 MCG/2ML IJ SOLN
INTRAMUSCULAR | Status: AC
  Filled 2020-03-02: qty 2

## 2020-03-02 MED ORDER — FENTANYL CITRATE (PF) 100 MCG/2ML IJ SOLN
INTRAMUSCULAR | Status: DC | PRN
  Administered 2020-03-02: 100 ug via INTRAVENOUS

## 2020-03-02 MED ORDER — DEXAMETHASONE SODIUM PHOSPHATE 10 MG/ML IJ SOLN
INTRAMUSCULAR | Status: DC | PRN
  Administered 2020-03-02: 10 mg via INTRAVENOUS

## 2020-03-02 MED ORDER — LACTATED RINGERS IV SOLN
INTRAVENOUS | Status: DC
  Administered 2020-03-02: 1000 mL via INTRAVENOUS

## 2020-03-02 MED ORDER — SIMETHICONE 80 MG PO CHEW: 40.0000 mg | CHEWABLE_TABLET | Freq: Four times a day (QID) | ORAL | Status: DC | PRN

## 2020-03-02 MED ORDER — TRAMADOL HCL 50 MG PO TABS
50.0000 mg | ORAL_TABLET | Freq: Four times a day (QID) | ORAL | Status: DC | PRN
  Administered 2020-03-02: 50 mg via ORAL
  Filled 2020-03-02: qty 1

## 2020-03-02 MED ORDER — ROCURONIUM BROMIDE 10 MG/ML (PF) SYRINGE
PREFILLED_SYRINGE | INTRAVENOUS | Status: DC | PRN
  Administered 2020-03-02: 60 mg via INTRAVENOUS

## 2020-03-02 MED ORDER — BUPIVACAINE LIPOSOME 1.3 % IJ SUSP
INTRAMUSCULAR | Status: DC | PRN
  Administered 2020-03-02: 20 mL

## 2020-03-02 MED ORDER — SUGAMMADEX SODIUM 500 MG/5ML IV SOLN
INTRAVENOUS | Status: AC
  Filled 2020-03-02: qty 5

## 2020-03-02 MED ORDER — PROPOFOL 10 MG/ML IV BOLUS
INTRAVENOUS | Status: DC | PRN
  Administered 2020-03-02: 200 mg via INTRAVENOUS

## 2020-03-02 MED ORDER — ONDANSETRON HCL 4 MG/2ML IJ SOLN
INTRAMUSCULAR | Status: AC
  Filled 2020-03-02: qty 2

## 2020-03-02 MED ORDER — DEXAMETHASONE SODIUM PHOSPHATE 10 MG/ML IJ SOLN
INTRAMUSCULAR | Status: AC
  Filled 2020-03-02: qty 1

## 2020-03-02 MED ORDER — PHENYLEPHRINE 40 MCG/ML (10ML) SYRINGE FOR IV PUSH (FOR BLOOD PRESSURE SUPPORT)
PREFILLED_SYRINGE | INTRAVENOUS | Status: AC
  Filled 2020-03-02: qty 10

## 2020-03-02 MED ORDER — PHENYLEPHRINE 40 MCG/ML (10ML) SYRINGE FOR IV PUSH (FOR BLOOD PRESSURE SUPPORT)
PREFILLED_SYRINGE | INTRAVENOUS | Status: DC | PRN
  Administered 2020-03-02: 80 ug via INTRAVENOUS

## 2020-03-02 MED ORDER — PROPOFOL 10 MG/ML IV BOLUS
INTRAVENOUS | Status: AC
  Filled 2020-03-02: qty 40

## 2020-03-02 MED ORDER — METOPROLOL TARTRATE 5 MG/5ML IV SOLN
INTRAVENOUS | Status: AC
  Filled 2020-03-02: qty 5

## 2020-03-02 MED ORDER — FENTANYL CITRATE (PF) 100 MCG/2ML IJ SOLN: 12.5000 ug | INTRAMUSCULAR | Status: DC | PRN

## 2020-03-02 MED ORDER — FENTANYL CITRATE (PF) 100 MCG/2ML IJ SOLN
25.0000 ug | INTRAMUSCULAR | Status: DC | PRN
  Administered 2020-03-02 (×2): 50 ug via INTRAVENOUS

## 2020-03-02 MED ORDER — HEMOSTATIC AGENTS (NO CHARGE) OPTIME
TOPICAL | Status: DC | PRN
  Administered 2020-03-02 (×2): 1 via TOPICAL

## 2020-03-02 MED ORDER — LIDOCAINE 2% (20 MG/ML) 5 ML SYRINGE
INTRAMUSCULAR | Status: AC
  Filled 2020-03-02: qty 5

## 2020-03-02 MED ORDER — CHLORHEXIDINE GLUCONATE CLOTH 2 % EX PADS: 6.0000 | MEDICATED_PAD | Freq: Once | CUTANEOUS | Status: DC

## 2020-03-02 MED ORDER — BUPIVACAINE LIPOSOME 1.3 % IJ SUSP
INTRAMUSCULAR | Status: AC
  Filled 2020-03-02: qty 20

## 2020-03-02 MED ORDER — CHLORHEXIDINE GLUCONATE CLOTH 2 % EX PADS
6.0000 | MEDICATED_PAD | Freq: Once | CUTANEOUS | Status: AC
  Administered 2020-03-02: 6 via TOPICAL

## 2020-03-02 MED ORDER — FLUTICASONE PROPIONATE 50 MCG/ACT NA SUSP
2.0000 | Freq: Every day | NASAL | Status: DC
  Administered 2020-03-02 – 2020-03-03 (×2): 2 via NASAL
  Filled 2020-03-02: qty 16

## 2020-03-02 MED ORDER — POTASSIUM CHLORIDE CRYS ER 20 MEQ PO TBCR
40.0000 meq | EXTENDED_RELEASE_TABLET | Freq: Once | ORAL | Status: AC
  Administered 2020-03-02: 40 meq via ORAL
  Filled 2020-03-02: qty 2

## 2020-03-02 MED ORDER — CHLORHEXIDINE GLUCONATE 0.12 % MT SOLN: 15.0000 mL | Freq: Once | OROMUCOSAL | Status: DC

## 2020-03-02 MED ORDER — SUGAMMADEX SODIUM 200 MG/2ML IV SOLN
INTRAVENOUS | Status: DC | PRN
  Administered 2020-03-02: 300 mg via INTRAVENOUS

## 2020-03-02 MED ORDER — LIDOCAINE 2% (20 MG/ML) 5 ML SYRINGE
INTRAMUSCULAR | Status: DC | PRN
  Administered 2020-03-02: 100 mg via INTRAVENOUS

## 2020-03-02 MED ORDER — ONDANSETRON HCL 4 MG/2ML IJ SOLN: 4.0000 mg | Freq: Once | INTRAMUSCULAR | Status: DC | PRN

## 2020-03-02 MED ORDER — SODIUM CHLORIDE 0.9 % IR SOLN
Status: DC | PRN
  Administered 2020-03-02: 3000 mL
  Administered 2020-03-02: 1000 mL

## 2020-03-02 MED ORDER — ROCURONIUM BROMIDE 10 MG/ML (PF) SYRINGE
PREFILLED_SYRINGE | INTRAVENOUS | Status: AC
  Filled 2020-03-02: qty 10

## 2020-03-02 MED ORDER — POVIDONE-IODINE 10 % EX OINT
TOPICAL_OINTMENT | CUTANEOUS | Status: AC
  Filled 2020-03-02: qty 1

## 2020-03-02 MED ORDER — ORAL CARE MOUTH RINSE: 15.0000 mL | Freq: Once | OROMUCOSAL | Status: DC

## 2020-03-02 MED ORDER — METOPROLOL TARTRATE 5 MG/5ML IV SOLN
INTRAVENOUS | Status: DC | PRN
  Administered 2020-03-02: 1 mg via INTRAVENOUS
  Administered 2020-03-02: 2 mg via INTRAVENOUS

## 2020-03-02 SURGICAL SUPPLY — 48 items
APPLICATOR ARISTA FLEXITIP XL (MISCELLANEOUS) ×1 IMPLANT
APPLIER CLIP ROT 10 11.4 M/L (STAPLE) ×2
BAG RETRIEVAL 10 (BASKET) ×1
CLIP APPLIE ROT 10 11.4 M/L (STAPLE) ×1 IMPLANT
CLOTH BEACON ORANGE TIMEOUT ST (SAFETY) ×2 IMPLANT
COVER LIGHT HANDLE STERIS (MISCELLANEOUS) ×4 IMPLANT
COVER WAND RF STERILE (DRAPES) ×2 IMPLANT
CUTTER ENDO LINEAR 45M (STAPLE) ×1 IMPLANT
CUTTER FLEX LINEAR 45M (STAPLE) ×1 IMPLANT
DURAPREP 26ML APPLICATOR (WOUND CARE) ×2 IMPLANT
ELECT REM PT RETURN 9FT ADLT (ELECTROSURGICAL) ×2
ELECTRODE REM PT RTRN 9FT ADLT (ELECTROSURGICAL) ×1 IMPLANT
GAUZE SPONGE 4X4 12PLY STRL (GAUZE/BANDAGES/DRESSINGS) ×1 IMPLANT
GLOVE BIOGEL PI IND STRL 7.0 (GLOVE) ×2 IMPLANT
GLOVE BIOGEL PI INDICATOR 7.0 (GLOVE) ×2
GLOVE SURG SS PI 7.5 STRL IVOR (GLOVE) ×2 IMPLANT
GOWN STRL REUS W/TWL LRG LVL3 (GOWN DISPOSABLE) ×6 IMPLANT
HEMOSTAT ARISTA ABSORB 3G PWDR (HEMOSTASIS) ×1 IMPLANT
HEMOSTAT SNOW SURGICEL 2X4 (HEMOSTASIS) ×2 IMPLANT
INST SET LAPROSCOPIC AP (KITS) ×2 IMPLANT
IV NS IRRIG 3000ML ARTHROMATIC (IV SOLUTION) ×1 IMPLANT
KIT TURNOVER KIT A (KITS) ×2 IMPLANT
MANIFOLD NEPTUNE II (INSTRUMENTS) ×2 IMPLANT
NDL HYPO 18GX1.5 BLUNT FILL (NEEDLE) ×1 IMPLANT
NDL INSUFFLATION 14GA 120MM (NEEDLE) ×1 IMPLANT
NEEDLE HYPO 18GX1.5 BLUNT FILL (NEEDLE) ×2 IMPLANT
NEEDLE HYPO 22GX1.5 SAFETY (NEEDLE) ×2 IMPLANT
NEEDLE INSUFFLATION 14GA 120MM (NEEDLE) ×2 IMPLANT
NS IRRIG 1000ML POUR BTL (IV SOLUTION) ×2 IMPLANT
PACK LAP CHOLE LZT030E (CUSTOM PROCEDURE TRAY) ×1 IMPLANT
PAD ARMBOARD 7.5X6 YLW CONV (MISCELLANEOUS) ×2 IMPLANT
PENCIL HANDSWITCHING (ELECTRODE) ×1 IMPLANT
SET BASIN LINEN APH (SET/KITS/TRAYS/PACK) ×2 IMPLANT
SET TUBE IRRIG SUCTION NO TIP (IRRIGATION / IRRIGATOR) ×1 IMPLANT
SET TUBE SMOKE EVAC HIGH FLOW (TUBING) ×2 IMPLANT
SLEEVE ENDOPATH XCEL 5M (ENDOMECHANICALS) ×2 IMPLANT
STAPLER VISISTAT (STAPLE) ×1 IMPLANT
SUT MNCRL AB 4-0 PS2 18 (SUTURE) ×4 IMPLANT
SUT VICRYL 0 UR6 27IN ABS (SUTURE) ×2 IMPLANT
SYR 20ML LL LF (SYRINGE) ×4 IMPLANT
SYS BAG RETRIEVAL 10MM (BASKET) ×1
SYSTEM BAG RETRIEVAL 10MM (BASKET) ×1 IMPLANT
TAPE CLOTH SURG 4X10 WHT LF (GAUZE/BANDAGES/DRESSINGS) ×1 IMPLANT
TROCAR ENDO BLADELESS 11MM (ENDOMECHANICALS) ×2 IMPLANT
TROCAR XCEL NON-BLD 5MMX100MML (ENDOMECHANICALS) ×2 IMPLANT
TROCAR XCEL UNIV SLVE 11M 100M (ENDOMECHANICALS) ×2 IMPLANT
TUBE CONNECTING 12X1/4 (SUCTIONS) ×2 IMPLANT
WARMER LAPAROSCOPE (MISCELLANEOUS) ×2 IMPLANT

## 2020-03-02 NOTE — Interval H&P Note (Signed)
History and Physical Interval Note:  03/02/2020 10:12 AM  Ryan Garner  has presented today for surgery, with the diagnosis of cholecystitis, cholelithiasis.  The various methods of treatment have been discussed with the patient and family. After consideration of risks, benefits and other options for treatment, the patient has consented to  Procedure(s): LAPAROSCOPIC CHOLECYSTECTOMY (N/A) as a surgical intervention.  The patient's history has been reviewed, patient examined, no change in status, stable for surgery.  I have reviewed the patient's chart and labs.  Questions were answered to the patient's satisfaction.     Aviva Signs

## 2020-03-02 NOTE — Progress Notes (Signed)
      By review of prior progress notes, the patient has been cleared from a cardiac perspective to proceed with planned laparoscopic cholecystectomy which is scheduled for today. Repeat limited echocardiogram showed a preserved EF of 65% with no regional wall motion normalities.  As previously recommended, will continue on Lopressor for rate control and plan to start Eliquis 5 mg twice daily following surgery and was cleared to do so from a surgical perspective. Please let us know if Cardiology can be of further assistance this admission.   Signed, Erma Heritage, PA-C 03/02/2020, 7:39 AM Pager: [phone]

## 2020-03-02 NOTE — Op Note (Signed)
Patient:  Ryan Garner  DOB:  May 23, 1946  MRN:  [id number]   Preop Diagnosis: Acute cholecystitis, cholelithiasis  Postop Diagnosis: Same, gangrene of gallbladder  Procedure: Laparoscopic cholecystectomy  Surgeon: Aviva Signs, MD  Assistant: Curlene Labrum, MD  Anes: General endotracheal  Indications: Patient is a 74 year old white male who presented to [facility] with worsening right upper quadrant abdominal pain.  Ultrasound the gallbladder revealed cholelithiasis.  The risks and benefits of the procedure including bleeding, infection, hepatobiliary injury, the possibility of an open procedure were fully explained to the patient, who gave informed consent.  His heparin drip had to be held due to recent diagnosis of new onset atrial fibrillation.  Procedure note: The patient was placed in the supine position.  After induction of general endotracheal anesthesia, the abdomen was prepped and draped using the usual sterile technique with ChloraPrep.  Surgical site confirmation was performed.  A supraumbilical incision was made down to the fascia.  A Veress needle was introduced into the abdominal cavity and confirmation of placement was done using the saline drop test.  The abdomen was then insufflated to 17 mmHg pressure.  An 11 mm trocar was placed in the epigastric region under direct visualization without difficulty.  The patient was placed in reverse Trendelenburg position and an additional 5 mm trocar was placed in the right upper quadrant and right flank regions.  Liver was inspected and noted to be within normal limits.  The gallbladder was noted to be gangrenous with a very thin gallbladder wall.  The gallbladder was then retracted in a dynamic fashion in order to provide a critical view of the triangle of [place]ot.  The cystic duct and artery were fully identified.  Endoclips were placed proximally distally and the cystic artery and cystic artery was divided.  Due to the frail  nature of the cystic duct, a vascular Endo GIA was placed across the cystic duct and fired.  The gallbladder was freed away from the gallbladder fossa using Bovie electrocautery.  The gallbladder was delivered through the epigastric trocar site using an Endo Catch bag.  The gallbladder fossa was inspected and no significant bleeding or bile leakage was noted.  Surgicel was placed in the gallbladder fossa as well as Arista.  All fluid and air were then evacuated from the abdominal cavity prior to removal of the trochars.  All wounds were irrigated with normal saline.  All wounds were injected with Exparel.  The supraumbilical fascia as well as epigastric fascia were reapproximated using 0 Vicryl interrupted sutures.  All skin incisions were closed using staples.  Betadine ointment and dry sterile dressings were applied.  All tape and needle counts were correct at the end of the procedure.  The patient was extubated in the operating room and transferred to PACU in stable condition.  Complications: None  EBL: Minimal  Specimen: Gallbladder

## 2020-03-02 NOTE — Progress Notes (Signed)
Pt ambulated to BR with only standby assist, urinated then back to bed. States pain medication given earlier was effective. Denies c/o at present. Using IS well twice per hour since arriving to room from PACU. Chest congestion has cleared. DDI to abd x4.

## 2020-03-02 NOTE — Progress Notes (Signed)
Pt assisted up to chair, initially felt "woozy" but cleared. Tolerated movement well. Drinking fluids without c/o n/v. Denies need for pain med at this time.

## 2020-03-02 NOTE — Anesthesia Preprocedure Evaluation (Addendum)
Anesthesia Evaluation  Patient identified by MRN, date of birth, ID band Patient awake    Reviewed: Allergy & Precautions, H&P , NPO status , Patient's Chart, lab work & pertinent test results, reviewed documented beta blocker date and time   Airway Mallampati: I  TM Distance: >3 FB Neck ROM: full    Dental no notable dental hx.    Pulmonary neg pulmonary ROS, former smoker,    Pulmonary exam normal breath sounds clear to auscultation       Cardiovascular Exercise Tolerance: Good hypertension, negative cardio ROS   Rhythm:regular Rate:Normal     Neuro/Psych negative neurological ROS  negative psych ROS   GI/Hepatic negative GI ROS, Neg liver ROS,   Endo/Other  diabetes, Type 2Morbid obesity  Renal/GU Renal disease  negative genitourinary   Musculoskeletal  (+) Arthritis ,   Abdominal   Peds  Hematology negative hematology ROS (+)   Anesthesia Other Findings   Reproductive/Obstetrics negative OB ROS                            Anesthesia Physical Anesthesia Plan  ASA: III  Anesthesia Plan: General   Post-op Pain Management:    Induction:   PONV Risk Score and Plan: Ondansetron  Airway Management Planned:   Additional Equipment:   Intra-op Plan:   Post-operative Plan:   Informed Consent: I have reviewed the patients History and Physical, chart, labs and discussed the procedure including the risks, benefits and alternatives for the proposed anesthesia with the patient or authorized representative who has indicated his/her understanding and acceptance.     Dental Advisory Given  Plan Discussed with: CRNA  Anesthesia Plan Comments:        Anesthesia Quick Evaluation

## 2020-03-02 NOTE — Progress Notes (Signed)
Pt down to pre-op holding via stretcher accompanied by OR staff. Wallet, ring, watch and cell phone/charger bagged and secured in med room by this nurse.

## 2020-03-02 NOTE — Progress Notes (Signed)
Pt back to room via stretcher for PACU. Alert and oriented, c/o abdominal "soreness", rates 6/10. DDI to abd x4. Pt with clear breath sounds, congested cough. Expectorating clear phlegm. Using IS as directed, tolerating well. Denies need for pain medication at this time. IV site WNL, no s/s infiltration. Family at bedside.

## 2020-03-02 NOTE — Anesthesia Procedure Notes (Signed)

## 2020-03-02 NOTE — Progress Notes (Signed)
PROGRESS NOTE    Ryan Garner  VOJ:[id number] DOB: 14-Mar-1946 DOA: 02/28/2020 PCP: [name], [name], MD    Brief Narrative:  HPI: Ryan Garner is a 74 y.o. male with medical history significant of hyperlipidemia, hypertension, type 2 diabetes, class III obesity with a BMI of 45.61 kg/m who is coming to the emergency department due to abdominal pain that started on Wednesday evening followed by 3 episodes of emesis that night, then several episodes of diarrhea since Thursday associated with fatigue, decreased appetite and abdominal pain.  He has been drinking fluids trying to stay hydrated as he could, but decided to come to the ED since his symptoms have not improved.  Not sick contacts or travel history.  Denies melena or hematochezia.  No dysuria, frequency or hematuria.  He denies fever, chills, sore throat, rhinorrhea, productive cough, wheezing or hemoptysis Denies chest pain, lightheadedness, dyspnea, palpitations, diaphoresis, PND, orthopnea or recent pitting edema of the lower extremities.  No polyuria, polydipsia, polyphagia or blurred vision.   Assessment & Plan:   Principal Problem:   Acute cholecystitis Active Problems:   Hypertension   Hyperlipidemia   Type 2 diabetes mellitus ([place])   Morbid obesity ([place])   AKI (acute kidney injury) ([place])   Hypokalemia   Hyponatremia   New onset atrial fibrillation ([facility])   Acute gangrenous cholecystitis   1. Acute cholecystitis.  Was seen by general surgery and cholecystectomy on 8/11.  Found to have gangrenous gallbladder.  Continue on antibiotics for now. 2. New onset atrial fibrillation with rapid ventricular response.  Incidental finding, patient did not have any symptoms.  He was noted to be tachycardic which improved with metoprolol. CHADSVASc score of at least 4.  Started on heparin infusion.  This was held for surgery.  He can be started on DOAC on 8/12.  Appreciate cardiology input 3. Acute kidney injury.  Likely related to volume  depletion with ACE inhibitor use.  Hold nephrotoxic agents and continue IV fluids.  Monitor urine output. Creatinine improved from 2.1 on admission to 0.92 4. Hypokalemia.  Replace, Mg normal 5. Hyponatremia.  Secondary to GI losses/thiazide.  Continued on IV fluids. Improved. 6. Hypertension.  Holding hydrochlorothiazide and ACE inhibitor. Blood pressure currently stable 7. Prediabetes.  Diet controlled. A1c 6.0.  Follow blood sugars. 8. Morbid obesity.  BMI 45.  Recommended lifestyle modifications.   DVT prophylaxis: Heparin infusion on hold for surgery.  Started on DOAC in a.m.  Code Status: Full code Family Communication: Discussed with patient Disposition Plan: Status is: Inpatient  Remains inpatient appropriate because:Ongoing diagnostic testing needed not appropriate for outpatient work up   Dispo: The patient is from: Home              Anticipated d/c is to: Home              Anticipated d/c date is: 1 day              Patient currently is not medically stable to d/c.   Consultants:   General surgery  Cardiology  Procedures:   Echo: EF 65% with no wall motion abnormalities  Antimicrobials:   Zosyn 8/8 >   Subjective: Patient seen in room postoperatively.  Feels that pain is controlled.  He is complaining of some sinus drainage.  Objective: Vitals:   03/02/20 1315 03/02/20 1330 03/02/20 1340 03/02/20 1900  BP: (!) 98/59  104/76 104/64  Pulse: 77 79 71 61  Resp: 17 12 16 16   Temp: (!) 97.5  F (36.4 C)  97.6 F (36.4 C) 98.6 F (37 C)  TempSrc:    Oral  SpO2: 97% 98% 99% 99%  Weight:      Height:        Intake/Output Summary (Last 24 hours) at 03/02/2020 2016 Last data filed at 03/02/2020 1327 Gross per 24 hour  Intake 1160 ml  Output 10 ml  Net 1150 ml   Filed Weights   02/28/20 1436 02/29/20 1600 03/02/20 1017  Weight: 136.1 kg (!) 137.1 kg (!) 137 kg    Examination:  General exam: Alert, awake, oriented x 3 Respiratory system: Clear to  auscultation. Respiratory effort normal. Cardiovascular system:RRR. No murmurs, rubs, gallops. Gastrointestinal system: Abdomen is nondistended, soft and nontender. No organomegaly or masses felt. Normal bowel sounds heard. Central nervous system: Alert and oriented. No focal neurological deficits. Extremities: No C/C/E, +pedal pulses Skin: No rashes, lesions or ulcers Psychiatry: Judgement and insight appear normal. Mood & affect appropriate.  .   Data Reviewed: I have personally reviewed following labs and imaging studies  CBC: Recent Labs  Lab 02/26/20 1643 02/28/20 1545 02/29/20 0413 03/01/20 0220 03/02/20 0522  WBC 29.5* 11.9* 7.9 7.8 9.1  NEUTROABS 26.5*  --  5.9  --   --   HGB 15.8 14.6 13.0 13.1 13.0  HCT 45.8 43.9 39.8 41.2 40.1  MCV 86 88.0 88.6 90.0 90.5  PLT 347 375 340 330 665   Basic Metabolic Panel: Recent Labs  Lab 02/28/20 1545 02/28/20 2237 02/29/20 0413 02/29/20 1231 03/01/20 0220 03/02/20 0522  NA 132*  --  136 137 136 136  K 3.3*  --  3.3* 3.5 3.2* 3.4*  CL 96*  --  104 106 109 107  CO2 22  --  21* 22 22 20*  GLUCOSE 159*  --  149* 134* 124* 120*  BUN 77*  --  67* 56* 41* 25*  CREATININE 2.13*  --  1.60* 1.45* 1.10 0.92  CALCIUM 9.1  --  8.2* 8.2* 7.9* 8.3*  MG  --  2.2  --   --  2.1  --   PHOS  --  3.8  --   --   --   --    GFR: Estimated Creatinine Clearance: 90.6 mL/min (by C-G formula based on SCr of 0.92 mg/dL). Liver Function Tests: Recent Labs  Lab 02/28/20 1545 02/29/20 0413 03/01/20 0220 03/02/20 0522  AST 28 17 16  13*  ALT 28 23 22 21   ALKPHOS 61 52 53 48  BILITOT 0.9 0.9 0.5 0.5  PROT 7.3 6.1* 5.8* 5.4*  ALBUMIN 3.4* 2.8* 2.6* 2.5*   Recent Labs  Lab 02/28/20 1545  LIPASE 17   No results for input(s): AMMONIA in the last 168 hours. Coagulation Profile: No results for input(s): INR, PROTIME in the last 168 hours. Cardiac Enzymes: No results for input(s): CKTOTAL, CKMB, CKMBINDEX, TROPONINI in the last 168  hours. BNP (last 3 results) No results for input(s): PROBNP in the last 8760 hours. HbA1C: Recent Labs    03/01/20 0220  HGBA1C 6.0*   CBG: Recent Labs  Lab 03/01/20 2121 03/02/20 0735 03/02/20 1045 03/02/20 1227 03/02/20 1523  GLUCAP 116* 128* 102* 118* 182*   Lipid Profile: No results for input(s): CHOL, HDL, LDLCALC, TRIG, CHOLHDL, LDLDIRECT in the last 72 hours. Thyroid Function Tests: No results for input(s): TSH, T4TOTAL, FREET4, T3FREE, THYROIDAB in the last 72 hours. Anemia Panel: No results for input(s): VITAMINB12, FOLATE, FERRITIN, TIBC, IRON, RETICCTPCT in the last  72 hours. Sepsis Labs: Recent Labs  Lab 02/28/20 2237  LATICACIDVEN 1.2    Recent Results (from the past 240 hour(s))  Novel Coronavirus, NAA ([facility])     Status: None   Collection Time: 02/26/20 12:00 AM   Specimen: Nasopharyngeal(NP) swabs in vial transport medium   Nasopharynge  Is this  Result Value Ref Range Status   SARS-CoV-2, NAA Not Detected Not Detected Final    Comment: This nucleic acid amplification test was developed and its performance characteristics determined by [employer]. Nucleic acid amplification tests include RT-PCR and TMA. This test has not been FDA cleared or approved. This test has been authorized by FDA under an Emergency Use Authorization (EUA). This test is only authorized for the duration of time the declaration that circumstances exist justifying the authorization of the emergency use of in vitro diagnostic tests for detection of SARS-CoV-2 virus and/or diagnosis of COVID-19 infection under section 564(b)(1) of the Act, 21 U.S.C. 149FWY-6(V) (1), unless the authorization is terminated or revoked sooner. When diagnostic testing is negative, the possibility of a false negative result should be considered in the context of a patient's recent exposures and the presence of clinical signs and symptoms consistent with COVID-19. An individual without  symptoms of COVID-19 and who is not shedding SARS-CoV-2 virus wo uld expect to have a negative (not detected) result in this assay.   SARS-COV-2, NAA 2 DAY TAT     Status: None   Collection Time: 02/26/20 12:00 AM   Nasopharynge  Is this  Result Value Ref Range Status   SARS-CoV-2, NAA 2 DAY TAT Performed  Final  SARS Coronavirus 2 by RT PCR (hospital order, performed in [place] hospital lab) Nasopharyngeal Nasopharyngeal Swab     Status: None   Collection Time: 02/28/20  9:38 PM   Specimen: Nasopharyngeal Swab  Result Value Ref Range Status   SARS Coronavirus 2 NEGATIVE NEGATIVE Final    Comment: (NOTE) SARS-CoV-2 target nucleic acids are NOT DETECTED.  The SARS-CoV-2 RNA is generally detectable in upper and lower respiratory specimens during the acute phase of infection. The lowest concentration of SARS-CoV-2 viral copies this assay can detect is 250 copies / mL. A negative result does not preclude SARS-CoV-2 infection and should not be used as the sole basis for treatment or other patient management decisions.  A negative result may occur with improper specimen collection / handling, submission of specimen other than nasopharyngeal swab, presence of viral mutation(s) within the areas targeted by this assay, and inadequate number of viral copies (<250 copies / mL). A negative result must be combined with clinical observations, patient history, and epidemiological information.  Fact Sheet for Patients:   StrictlyIdeas.no  Fact Sheet for Healthcare Providers: BankingDealers.co.za  This test is not yet approved or  cleared by the [place] FDA and has been authorized for detection and/or diagnosis of SARS-CoV-2 by FDA under an Emergency Use Authorization (EUA).  This EUA will remain in effect (meaning this test can be used) for the duration of the COVID-19 declaration under Section 564(b)(1) of the Act, 21 U.S.C. section  360bbb-3(b)(1), unless the authorization is terminated or revoked sooner.  Performed at [facility], [address] [place], [place] [address]          Radiology Studies: ECHOCARDIOGRAM LIMITED  Result Date: 03/01/2020    ECHOCARDIOGRAM LIMITED REPORT   Patient Name:   Ryan Garner Date of Exam: 03/01/2020 Medical Rec #:  [id number]    Height:  66.5 in Accession #:    [id number]   Weight:       302.2 lb Date of Birth:  May 10, 1946     BSA:          2.397 m Patient Age:    68 years     BP:           110/51 mmHg Patient Gender: M            HR:           89 bpm. Exam Location:  [facility] Procedure: Limited Echo STAT ECHO Indications:    Preoperative evaluation  History:        Patient has prior history of Echocardiogram examinations, most                 recent 02/29/2020. Arrythmias:Atrial Fibrillation; Risk                 Factors:Hypertension, Diabetes and Dyslipidemia. Morbid obesity.  Sonographer:    Alvino Chapel RCS Referring Phys: [place]  1. Limited echo for EF. Normal LV size Mild LVH normal EF 65% with no RWMAls Definity used for endocardial border definition. FINDINGS  Left Ventricle: Definity contrast agent was given IV to delineate the left ventricular endocardial borders. Additional Comments: Limited echo for EF. Normal LV size Mild LVH normal EF 65% with no RWMAls Definity used for endocardial border definition. LEFT VENTRICLE PLAX 2D LVIDd:         5.11 cm LVIDs:         3.56 cm LV PW:         1.11 cm LV IVS:        1.30 cm  LV Volumes (MOD) LV vol d, MOD A2C: 76.9 ml LV vol d, MOD A4C: 143.0 ml LV vol s, MOD A2C: 31.7 ml LV vol s, MOD A4C: 46.4 ml LV SV MOD A2C:     45.2 ml LV SV MOD A4C:     143.0 ml LV SV MOD BP:      71.8 ml LEFT ATRIUM         Index LA diam:    4.00 cm 1.67 cm/m   AORTA Ao Root diam: 3.60 cm Jenkins Rouge MD Electronically signed by Jenkins Rouge MD Signature Date/Time: 03/01/2020/9:11:56 AM    Final         Scheduled Meds: .  Chlorhexidine Gluconate Cloth  6 each Topical Once  . fluticasone  2 spray Each Nare Daily  . insulin aspart  0-9 Units Subcutaneous TID WC  . metoprolol tartrate  12.5 mg Oral BID   Continuous Infusions: . sodium chloride 75 mL/hr at 03/02/20 1422  . piperacillin-tazobactam (ZOSYN)  IV 3.375 g (03/02/20 1726)     LOS: 3 days    Time spent: 27mins    Kathie Dike, MD [facility]   If 7PM-7AM, please contact night-coverage [email or web address]  03/02/2020, 8:16 PM

## 2020-03-02 NOTE — Transfer of Care (Signed)
Immediate Anesthesia Transfer of Care Note  Patient: Quinnlan Abruzzo  Procedure(s) Performed: LAPAROSCOPIC CHOLECYSTECTOMY (N/A )  Patient Location: PACU  Anesthesia Type:General  Level of Consciousness: awake, alert  and oriented  Airway & Oxygen Therapy: Patient Spontanous Breathing and Patient connected to nasal cannula oxygen  Post-op Assessment: Report given to RN and Post -op Vital signs reviewed and stable  Post vital signs: Reviewed and stable  Last Vitals:  Vitals Value Taken Time  BP 108/77 03/02/20 1221  Temp    Pulse 85 03/02/20 1227  Resp 20 03/02/20 1227  SpO2 100 % 03/02/20 1227  Vitals shown include unvalidated device data.  Last Pain:  Vitals:   03/02/20 1017  TempSrc: Oral  PainSc: 0-No pain      Patients Stated Pain Goal: 8 (64/35/39 1225)  Complications: No complications documented.

## 2020-03-02 NOTE — Anesthesia Postprocedure Evaluation (Signed)
Anesthesia Post Note  Patient: Ryan Garner  Procedure(s) Performed: LAPAROSCOPIC CHOLECYSTECTOMY (N/A )  Patient location during evaluation: PACU Anesthesia Type: General Level of consciousness: awake Pain management: pain level controlled Vital Signs Assessment: post-procedure vital signs reviewed and stable Respiratory status: spontaneous breathing Cardiovascular status: blood pressure returned to baseline Anesthetic complications: no   No complications documented.   Last Vitals:  Vitals:   03/02/20 1330 03/02/20 1340  BP:  104/76  Pulse: 79 71  Resp: 12 16  Temp:  36.4 C  SpO2: 98% 99%    Last Pain:  Vitals:   03/02/20 1315  TempSrc:   PainSc: [name]

## 2020-03-03 ENCOUNTER — Encounter (HOSPITAL_COMMUNITY): Payer: Self-pay | Admitting: General Surgery

## 2020-03-03 ENCOUNTER — Telehealth: Payer: Self-pay | Admitting: *Deleted

## 2020-03-03 DIAGNOSIS — K819 Cholecystitis, unspecified: Secondary | ICD-10-CM

## 2020-03-03 LAB — COMPREHENSIVE METABOLIC PANEL
ALT: 36 U/L (ref 0–44)
AST: 36 U/L (ref 15–41)
Albumin: 2.6 g/dL — ABNORMAL LOW (ref 3.5–5.0)
Alkaline Phosphatase: 48 U/L (ref 38–126)
Anion gap: 9 (ref 5–15)
BUN: 19 mg/dL (ref 8–23)
CO2: 22 mmol/L (ref 22–32)
Calcium: 8.5 mg/dL — ABNORMAL LOW (ref 8.9–10.3)
Chloride: 106 mmol/L (ref 98–111)
Creatinine, Ser: 0.96 mg/dL (ref 0.61–1.24)
GFR calc Af Amer: >60 mL/min (ref >60)
GFR calc non Af Amer: >60 mL/min (ref >60)
Glucose, Bld: 136 mg/dL — ABNORMAL HIGH (ref 70–99)
Potassium: 3.8 mmol/L (ref 3.5–5.1)
Sodium: 137 mmol/L (ref 135–145)
Total Bilirubin: 0.5 mg/dL (ref 0.3–1.2)
Total Protein: 5.5 g/dL — ABNORMAL LOW (ref 6.5–8.1)

## 2020-03-03 LAB — CBC
HCT: 39.3 % (ref 39.0–52.0)
Hemoglobin: 12.8 g/dL — ABNORMAL LOW (ref 13.0–17.0)
MCH: 29.3 pg (ref 26.0–34.0)
MCHC: 32.6 g/dL (ref 30.0–36.0)
MCV: 89.9 fL (ref 80.0–100.0)
Platelets: 355 10*3/uL (ref 150–400)
RBC: 4.37 MIL/uL (ref 4.22–5.81)
RDW: 12.5 % (ref 11.5–15.5)
WBC: 13.4 10*3/uL — ABNORMAL HIGH (ref 4.0–10.5)
nRBC: 0 % (ref 0.0–0.2)

## 2020-03-03 LAB — GLUCOSE, CAPILLARY: Glucose-Capillary: 117 mg/dL — ABNORMAL HIGH (ref 70–99)

## 2020-03-03 MED ORDER — APIXABAN 5 MG PO TABS: 5.0000 mg | ORAL_TABLET | Freq: Two times a day (BID) | ORAL | 2 refills | Status: DC

## 2020-03-03 MED ORDER — METOPROLOL TARTRATE 25 MG PO TABS: 12.5000 mg | ORAL_TABLET | Freq: Two times a day (BID) | ORAL | 1 refills | Status: DC

## 2020-03-03 MED ORDER — AMOXICILLIN-POT CLAVULANATE 875-125 MG PO TABS: 1.0000 | ORAL_TABLET | Freq: Two times a day (BID) | ORAL | 0 refills | Status: AC

## 2020-03-03 MED ORDER — APIXABAN 5 MG PO TABS
5.0000 mg | ORAL_TABLET | Freq: Two times a day (BID) | ORAL | Status: DC
  Administered 2020-03-03: 5 mg via ORAL
  Filled 2020-03-03: qty 1

## 2020-03-03 MED ORDER — TRAMADOL HCL 50 MG PO TABS: 50.0000 mg | ORAL_TABLET | Freq: Three times a day (TID) | ORAL | 0 refills | Status: AC | PRN

## 2020-03-03 NOTE — Discharge Summary (Signed)
Physician Discharge Summary   Patient ID: Ryan Garner MRN: [id number] DOB/AGE: 1945/07/29 74 y.o.  Admit date: 02/28/2020 Discharge date: 03/03/2020  Primary Care Physician:  [name], [name], MD   Recommendations for Outpatient Follow-up:  1. Follow up with PCP in 1-2 weeks 2. Continue Augmentin 875-125 mg 1 tab twice daily for 5 days  Home Health: None, at baseline Equipment/Devices:   Discharge Condition: stable CODE STATUS: FULL  Diet recommendation: Heart healthy diet   Discharge Diagnoses:    . Acute cholecystitis status post laparoscopic cholecystectomy . New onset atrial fibrillation ([place]) . Hyperlipidemia . Hypertension . Morbid obesity ([place]) . AKI (acute kidney injury) ([place]) . Hypokalemia . Hyponatremia . New onset atrial fibrillation [facility])   Prediabetes  Consults:  General surgery, Dr. Arnoldo Morale Cardiology, Dr. Harrington Challenger    Allergies:   Allergies  Allergen Reactions  . Rofecoxib Other (See Comments)    (Vioxx)     DISCHARGE MEDICATIONS: Allergies as of 03/03/2020      Reactions   Rofecoxib Other (See Comments)   (Vioxx)      Medication List    STOP taking these medications   lisinopril-hydrochlorothiazide 10-12.5 MG tablet Commonly known as: ZESTORETIC     TAKE these medications   albuterol 108 (90 Base) MCG/ACT inhaler Commonly known as: VENTOLIN HFA Inhale 2 puffs into the lungs every 6 (six) hours as needed for wheezing or shortness of breath.   amoxicillin-clavulanate 875-125 MG tablet Commonly known as: Augmentin Take 1 tablet by mouth 2 (two) times daily for 5 days.   apixaban 5 MG Tabs tablet Commonly known as: ELIQUIS Take 1 tablet (5 mg total) by mouth 2 (two) times daily.   fluticasone 50 MCG/ACT nasal spray Commonly known as: FLONASE Place 2 sprays into both nostrils daily. What changed:   when to take this  reasons to take this   metoprolol tartrate 25 MG tablet Commonly known as: LOPRESSOR Take 0.5 tablets (12.5  mg total) by mouth 2 (two) times daily.   multivitamin with minerals Tabs tablet Take 1 tablet by mouth daily.   ondansetron 4 MG disintegrating tablet Commonly known as: Zofran ODT Take 1 tablet (4 mg total) by mouth every 8 (eight) hours as needed for nausea or vomiting.   traMADol 50 MG tablet Commonly known as: ULTRAM Take 1 tablet (50 mg total) by mouth every 8 (eight) hours as needed for up to 5 days (mild pain).            Discharge Care Instructions  (From admission, onward)         Start     Ordered   03/03/20 0000  If the dressing is still on your incision site when you go home, remove it on the third day after your surgery date. Remove dressing if it begins to fall off, or if it is dirty or damaged before the third day.        03/03/20 0924           Brief H and P: For complete details please refer to admission H and P, but in brief *Ryan Knightis a 74 y.o.malewith medical history significant ofhyperlipidemia, hypertension, type 2 diabetes, class III obesity with a BMI of 45.61 kg/mpresented to emergency department due to abdominal pain that started on Wednesday evening followed by 3 episodes of emesis that night, then several episodes of diarrhea since Thursday associated with fatigue, decreased appetite and abdominal pain. He had been drinking fluids trying to stay hydrated as he  could, but decided to come to the ED since his symptoms did not improved. Not sick contacts or travel history. Denied melena or hematochezia.  Hospital Course:  1. Acute cholecystitis.       -Patient was placed on n.p.o. status, IV fluids, pain control.  General surgery was      consulted.  Patient underwent laparoscopic cholecystectomy on 8/11.      -Postop day #1, doing well, tolerating regular diet.  He was found to have gangrenous     gallbladder.      -Per Dr. Arnoldo Morale, cleared by general surgery for discharge and recommended         Augmentin 1 tab p.o. twice daily for 5  days.   2. New onset atrial fibrillation with rapid ventricular response.  - Incidental finding, patient did not have any symptoms.  He was noted to be tachycardic which improved with metoprolol.  -CHADSVASc score of at least 4.  Started on heparin drip, which was held for surgery.   -Cardiology was consulted, recommended follow-up in A. fib clinic.  He was placed on Eliquis before discharge once cleared by general surgery.  Patient was given instructions regarding Eliquis and risk of bleeding.   3. Acute kidney injury.  Likely related to volume depletion with ACE inhibitor use.  Lisinopril, HCTZ were held.  Patient was placed on IV fluid hydration.creatinine improved from 2.1 at the time of admission to 0.96 at the time of discharge. 4. hypokalemia.    Replaced, potassium 3.8 at the time of discharge 5. Hyponatremia.  Secondary to GI losses/thiazide, sodium improved 137 at the time of discharge.  HCTZ and ACE inhibitor were held. 6. Hypertension.  :  BP stable, patient was placed on low-dose metoprolol.  HCTZ-benazepril discontinued. 7. Prediabetes.  Diet controlled. A1c 6.0.  Follow blood sugars outpatient. 8. Morbid obesity.  BMI 45.  Recommended lifestyle modifications   Day of Discharge S: No acute complaints.  Heart rate controlled.  Tolerating regular diet.  BP (!) 100/54 (BP Location: Left Arm)   Pulse 68   Temp 97.6 F (36.4 C) (Oral)   Resp 18   Ht 5' 6.5" (1.689 m)   Wt (!) 137 kg   SpO2 99%   BMI 48.02 kg/m   Physical Exam: General: Alert and awake oriented x3 not in any acute distress. HEENT: anicteric sclera, pupils reactive to light and accommodation CVS: S1-S2 clear no murmur rubs or gallops Chest: clear to auscultation bilaterally, no wheezing rales or rhonchi Abdomen: soft, nondistended, normal bowel sounds, incision CDI Extremities: no cyanosis, clubbing or edema noted bilaterally Neuro: Cranial nerves II-XII intact, no focal neurological deficits    Get  Medicines reviewed and adjusted: Please take all your medications with you for your next visit with your Primary MD  Please request your Primary MD to go over all hospital tests and procedure/radiological results at the follow up. Please ask your Primary MD to get all Hospital records sent to his/her office.  If you experience worsening of your admission symptoms, develop shortness of breath, life threatening emergency, suicidal or homicidal thoughts you must seek medical attention immediately by calling 911 or calling your MD immediately  if symptoms less severe.  You must read complete instructions/literature along with all the possible adverse reactions/side effects for all the Medicines you take and that have been prescribed to you. Take any new Medicines after you have completely understood and accept all the possible adverse reactions/side effects.   Do not drive when  taking pain medications.   Do not take more than prescribed Pain, Sleep and Anxiety Medications  Special Instructions: If you have smoked or chewed Tobacco  in the last 2 yrs please stop smoking, stop any regular Alcohol  and or any Recreational drug use.  Wear Seat belts while driving.  Please note  You were cared for by a hospitalist during your hospital stay. Once you are discharged, your primary care physician will handle any further medical issues. Please note that NO REFILLS for any discharge medications will be authorized once you are discharged, as it is imperative that you return to your primary care physician (or establish a relationship with a primary care physician if you do not have one) for your aftercare needs so that they can reassess your need for medications and monitor your lab values.   The results of significant diagnostics from this hospitalization (including imaging, microbiology, ancillary and laboratory) are listed below for reference.      Procedures/Studies:  CT ABDOMEN PELVIS WO  CONTRAST  Result Date: 02/28/2020 CLINICAL DATA:  Nausea, vomiting.  Abdominal pain. EXAM: CT ABDOMEN AND PELVIS WITHOUT CONTRAST TECHNIQUE: Multidetector CT imaging of the abdomen and pelvis was performed following the standard protocol without IV contrast. COMPARISON:  None. FINDINGS: Lower chest: Coronary artery calcifications. Scarring in the lung bases. No effusions. Hepatobiliary: Gallbladder is distended with surrounding inflammation most compatible with cholecystitis. Scattered calcifications in the liver. Pancreas: No focal abnormality or ductal dilatation. Spleen: No focal abnormality.  Normal size. Adrenals/Urinary Tract: No adrenal abnormality. No focal renal abnormality. No stones or hydronephrosis. Urinary bladder is unremarkable. Stomach/Bowel: Mildly prominent small bowel loops in the pelvis containing fluid and air-fluid levels. Fluid throughout the colon. Distal small bowel loops are decompressed. This could reflect focal ileus or distal small bowel obstruction. Vascular/Lymphatic: Aortic atherosclerosis. No evidence of aneurysm or adenopathy. Reproductive: No visible focal abnormality. Other: No free fluid or free air. Umbilical and left periumbilical hernias noted containing fat. There is stranding within the fat suggest the possibility of incarceration. Left inguinal hernia contains fat. No free fluid or free air. Musculoskeletal: No acute bony abnormality. IMPRESSION: Distended gallbladder with surrounding inflammation most compatible with acute cholecystitis. No ductal dilatation seen. This could be further evaluated with right upper quadrant ultrasound if felt clinically indicated. Prominent small bowel loops with air-fluid levels. Distal small bowel loops are decompressed. This could reflect focal ileus or distal small bowel obstruction. Umbilical and left periumbilical hernias contain fat with stranding which could reflect incarceration. Left inguinal hernia contains fat. Electronically  Signed   By: Rolm Baptise M.D.   On: 02/28/2020 22:19   DG Chest Portable 1 View  Result Date: 02/28/2020 CLINICAL DATA:  Nausea, vomiting EXAM: PORTABLE CHEST 1 VIEW COMPARISON:  10/15/2013 FINDINGS: Linear bibasilar atelectasis. Heart is borderline in size. No effusions or acute bony abnormality. IMPRESSION: Bibasilar atelectasis. Electronically Signed   By: Rolm Baptise M.D.   On: 02/28/2020 21:52   ECHOCARDIOGRAM COMPLETE  Result Date: 02/29/2020    ECHOCARDIOGRAM REPORT   Patient Name:   Ryan Garner Date of Exam: 02/29/2020 Medical Rec #:  [id number]    Height:       68.0 in Accession #:    [id number]   Weight:       300.0 lb Date of Birth:  [birth year]     BSA:          2.428 m Patient Age:    35 years     BP:  114/69 mmHg Patient Gender: M            HR:           90 bpm. Exam Location:  [facility] Procedure: 2D Echo Indications:    Atrial Fibrillation 427.31 / I48.91  History:        Patient has no prior history of Echocardiogram examinations.                 Arrythmias:Atrial Fibrillation; Risk Factors:Former Smoker,                 Diabetes, Dyslipidemia and Hypertension. Ascending Aortic                 Aneurysm.  Sonographer:    Leavy Cella RDCS (AE) Referring Phys: [id number] DAVID MANUEL [place]  1. Poor acoustic windows limit study Would recomm limited echo and Definity to further define LVEF and wall motion. . Left ventricular ejection fraction, by estimation, is indeterminate.. There is mild left ventricular hypertrophy. Left ventricular diastolic parameters are indeterminate.  2. Right ventricular systolic function is normal. The right ventricular size is mildly enlarged. There is moderately elevated pulmonary artery systolic pressure.  3. Left atrial size was severely dilated.  4. Right atrial size was mildly dilated.  5. The mitral valve is normal in structure. Mild mitral valve regurgitation.  6. AV is thickened, calcified with mildly restricted motion Peak and mean  gradients through the valve are approximately 27 and 16 mm Hg respectively consistent with mild AS. Marland Kitchen The aortic valve is tricuspid. Aortic valve regurgitation is not visualized.  7. The inferior vena cava is normal in size with greater than 50% respiratory variability, suggesting right atrial pressure of 3 mmHg. FINDINGS  Left Ventricle: Poor acoustic windows limit study Would recomm limited echo and Definity to further define LVEF and wall motion. Left ventricular ejection fraction, by estimation, is indeterminate%. The left ventricle has not fully assessed function. Left ventricular endocardial border not optimally defined to evaluate regional wall motion. The left ventricular internal cavity size was normal in size. There is mild left ventricular hypertrophy. Left ventricular diastolic parameters are indeterminate. Right Ventricle: The right ventricular size is mildly enlarged. Right vetricular wall thickness was not assessed. Right ventricular systolic function is normal. There is moderately elevated pulmonary artery systolic pressure. The tricuspid regurgitant velocity is 3.45 m/s, and with an assumed right atrial pressure of 10 mmHg, the estimated right ventricular systolic pressure is 38.7 mmHg. Left Atrium: Left atrial size was severely dilated. Right Atrium: Right atrial size was mildly dilated. Pericardium: There is no evidence of pericardial effusion. Mitral Valve: The mitral valve is normal in structure. Mild mitral valve regurgitation. Tricuspid Valve: The tricuspid valve is normal in structure. Tricuspid valve regurgitation is mild. Aortic Valve: AV is thickened, calcified with mildly restricted motion Peak and mean gradients through the valve are approximately 27 and 16 mm Hg respectively consistent with mild AS. The aortic valve is tricuspid. Aortic valve regurgitation is not visualized. Pulmonic Valve: The pulmonic valve was not well visualized. Pulmonic valve regurgitation is not visualized.  Aorta: The aortic root is normal in size and structure. Venous: The inferior vena cava is normal in size with greater than 50% respiratory variability, suggesting right atrial pressure of 3 mmHg. IAS/Shunts: The interatrial septum was not assessed.  LEFT VENTRICLE PLAX 2D LVIDd:         4.78 cm  Diastology LVIDs:         3.99 cm  LV e' lateral:   11.20 cm/s LV PW:         1.12 cm  LV E/e' lateral: 10.0 LV IVS:        1.27 cm  LV e' medial:    9.03 cm/s LVOT diam:     2.10 cm  LV E/e' medial:  12.4 LVOT Area:     3.46 cm  RIGHT VENTRICLE RV S prime:     15.60 cm/s TAPSE (M-mode): 2.9 cm LEFT ATRIUM              Index LA diam:        5.10 cm  2.10 cm/m LA Vol (A2C):   131.0 ml 53.95 ml/m LA Vol (A4C):   124.0 ml 51.07 ml/m LA Biplane Vol: 134.0 ml 55.19 ml/m   AORTA Ao Root diam: 3.40 cm MITRAL VALVE                TRICUSPID VALVE MV Area (PHT): 4.54 cm     TR Peak grad:   47.6 mmHg MV Decel Time: 167 msec     TR Vmax:        345.00 cm/s MR Peak grad: 112.4 mmHg MR Mean grad: 73.0 mmHg     SHUNTS MR Vmax:      530.00 cm/s   Systemic Diam: 2.10 cm MR Vmean:     406.0 cm/s MV E velocity: 112.00 cm/s MV A velocity: 33.80 cm/s MV E/A ratio:  3.31 Dorris Carnes MD Electronically signed by Dorris Carnes MD Signature Date/Time: 02/29/2020/5:18:50 PM    Final    ECHOCARDIOGRAM LIMITED  Result Date: 03/01/2020    ECHOCARDIOGRAM LIMITED REPORT   Patient Name:   Ryan Garner Date of Exam: 03/01/2020 Medical Rec #:  [id number]    Height:       66.5 in Accession #:    [id number]   Weight:       302.2 lb Date of Birth:  06/19/1946     BSA:          2.397 m Patient Age:    61 years     BP:           110/51 mmHg Patient Gender: M            HR:           89 bpm. Exam Location:  [facility] Procedure: Limited Echo STAT ECHO Indications:    Preoperative evaluation  History:        Patient has prior history of Echocardiogram examinations, most                 recent 02/29/2020. Arrythmias:Atrial Fibrillation; Risk                  Factors:Hypertension, Diabetes and Dyslipidemia. Morbid obesity.  Sonographer:    Alvino Chapel RCS Referring Phys: [place]  1. Limited echo for EF. Normal LV size Mild LVH normal EF 65% with no RWMAls Definity used for endocardial border definition. FINDINGS  Left Ventricle: Definity contrast agent was given IV to delineate the left ventricular endocardial borders. Additional Comments: Limited echo for EF. Normal LV size Mild LVH normal EF 65% with no RWMAls Definity used for endocardial border definition. LEFT VENTRICLE PLAX 2D LVIDd:         5.11 cm LVIDs:         3.56 cm LV PW:         1.11 cm LV IVS:  1.30 cm  LV Volumes (MOD) LV vol d, MOD A2C: 76.9 ml LV vol d, MOD A4C: 143.0 ml LV vol s, MOD A2C: 31.7 ml LV vol s, MOD A4C: 46.4 ml LV SV MOD A2C:     45.2 ml LV SV MOD A4C:     143.0 ml LV SV MOD BP:      71.8 ml LEFT ATRIUM         Index LA diam:    4.00 cm 1.67 cm/m   AORTA Ao Root diam: 3.60 cm Jenkins Rouge MD Electronically signed by Jenkins Rouge MD Signature Date/Time: 03/01/2020/9:11:56 AM    Final    US Abdomen Limited RUQ  Result Date: 02/29/2020 CLINICAL DATA:  Abdominal pain. Nausea and vomiting. Cholelithiasis. EXAM: ULTRASOUND ABDOMEN LIMITED RIGHT UPPER QUADRANT COMPARISON:  None. FINDINGS: Gallbladder: Gallbladder is distended and contains multiple gallstones, largest measuring 1.7 cm. Diffuse gallbladder wall thickening is seen measuring up to 9 mm in thickness. These findings are consistent with acute cholecystitis. Common bile duct: Diameter: 4 mm, within normal limits. Liver: No focal lesion identified. Within normal limits in parenchymal echogenicity. Portal vein is patent on color Doppler imaging with normal direction of blood flow towards the liver. Other: None. IMPRESSION: Cholelithiasis with diffuse gallbladder wall thickening, consistent with acute cholecystitis. No evidence of biliary ductal dilatation. Electronically Signed   By: Marlaine Hind M.D.    On: 02/29/2020 09:22       LAB RESULTS: Basic Metabolic Panel: Recent Labs  Lab 02/28/20 2237 02/29/20 0413 03/01/20 0220 03/01/20 0220 03/02/20 0522 03/03/20 0448  NA  --    < > 136   < > 136 137  K  --    < > 3.2*   < > 3.4* 3.8  CL  --    < > 109   < > 107 106  CO2  --    < > 22   < > 20* 22  GLUCOSE  --    < > 124*   < > 120* 136*  BUN  --    < > 41*   < > 25* 19  CREATININE  --    < > 1.10   < > 0.92 0.96  CALCIUM  --    < > 7.9*   < > 8.3* 8.5*  MG 2.2  --  2.1  --   --   --   PHOS 3.8  --   --   --   --   --    < > = values in this interval not displayed.   Liver Function Tests: Recent Labs  Lab 03/02/20 0522 03/03/20 0448  AST 13* 36  ALT 21 36  ALKPHOS 48 48  BILITOT 0.5 0.5  PROT 5.4* 5.5*  ALBUMIN 2.5* 2.6*   Recent Labs  Lab 02/28/20 1545  LIPASE 17   No results for input(s): AMMONIA in the last 168 hours. CBC: Recent Labs  Lab 02/29/20 0413 03/01/20 0220 03/02/20 0522 03/02/20 0522 03/03/20 0448  WBC 7.9   < > 9.1  --  13.4*  NEUTROABS 5.9  --   --   --   --   HGB 13.0   < > 13.0  --  12.8*  HCT 39.8   < > 40.1  --  39.3  MCV 88.6   < > 90.5   < > 89.9  PLT 340   < > 312  --  355   < > =  values in this interval not displayed.   Cardiac Enzymes: No results for input(s): CKTOTAL, CKMB, CKMBINDEX, TROPONINI in the last 168 hours. BNP: Invalid input(s): POCBNP CBG: Recent Labs  Lab 03/02/20 2039 03/03/20 0823  GLUCAP 165* 117*       Disposition and Follow-up: Discharge Instructions    Amb referral to AFIB Clinic   Complete by: As directed    Diet - low sodium heart healthy   Complete by: As directed    If the dressing is still on your incision site when you go home, remove it on the third day after your surgery date. Remove dressing if it begins to fall off, or if it is dirty or damaged before the third day.   Complete by: As directed    Increase activity slowly   Complete by: As directed        DISPOSITION:  [facility]    Aviva Signs, MD. Schedule an appointment as soon as possible for a visit on 03/15/2020.   Specialty: General Surgery Why: Appointment time: 3:15 pm  Contact information: [address] [place] [address] [phone]        Fay Records, MD. Schedule an appointment as soon as possible for a visit in 2 week(s).   Specialty: Cardiology Contact information: [address] [place] [address] [phone]                Time coordinating discharge:  35 minutes  Signed:   Estill Cotta M.D. [facility] 03/03/2020, 12:21 PM

## 2020-03-03 NOTE — Progress Notes (Signed)
Discharge instructions reviewed with patient. Given AVS, follow-up scheduled as directed. Verbalized understanding of instructions, postop care and when to call provider if any questions/concerns. IV site removed, site within normal limits. Patient left floor in stable condition via w/c accompanied by nursing staff. Discharged home.

## 2020-03-03 NOTE — Discharge Instructions (Signed)
Laparoscopic Cholecystectomy, Care After This sheet gives you information about how to care for yourself after your procedure. Your health care provider may also give you more specific instructions. If you have problems or questions, contact your health care provider. What can I expect after the procedure? After the procedure, it is common to have:  Pain at your incision sites. You will be given medicines to control this pain.  Mild nausea or vomiting.  Bloating and possible shoulder pain from the air-like gas that was used during the procedure. Follow these instructions at home: Incision care   Follow instructions from your health care provider about how to take care of your incisions. Make sure you: ? Wash your hands with soap and water before you change your bandage (dressing). If soap and water are not available, use hand sanitizer. ? Change your dressing as told by your health care provider. ? Leave stitches (sutures), skin glue, or adhesive strips in place. These skin closures may need to be in place for 2 weeks or longer. If adhesive strip edges start to loosen and curl up, you may trim the loose edges. Do not remove adhesive strips completely unless your health care provider tells you to do that.  Do not take baths, swim, or use a hot tub until your health care provider approves. Ask your health care provider if you can take showers. You may only be allowed to take sponge baths for bathing.  Check your incision area every day for signs of infection. Check for: ? More redness, swelling, or pain. ? More fluid or blood. ? Warmth. ? Pus or a bad smell. Activity  Do not drive or use heavy machinery while taking prescription pain medicine.  Do not lift anything that is heavier than 10 lb (4.5 kg) until your health care provider approves.  Do not play contact sports until your health care provider approves.  Do not drive for 24 hours if you were given a medicine to help you relax  (sedative).  Rest as needed. Do not return to work or school until your health care provider approves. General instructions  Take over-the-counter and prescription medicines only as told by your health care provider.  To prevent or treat constipation while you are taking prescription pain medicine, your health care provider may recommend that you: ? Drink enough fluid to keep your urine clear or pale yellow. ? Take over-the-counter or prescription medicines. ? Eat foods that are high in fiber, such as fresh fruits and vegetables, whole grains, and beans. ? Limit foods that are high in fat and processed sugars, such as fried and sweet foods. Contact a health care provider if:  You develop a rash.  You have more redness, swelling, or pain around your incisions.  You have more fluid or blood coming from your incisions.  Your incisions feel warm to the touch.  You have pus or a bad smell coming from your incisions.  You have a fever.  One or more of your incisions breaks open. Get help right away if:  You have trouble breathing.  You have chest pain.  You have increasing pain in your shoulders.  You faint or feel dizzy when you stand.  You have severe pain in your abdomen.  You have nausea or vomiting that lasts for more than one day.  You have leg pain. This information is not intended to replace advice given to you by your health care provider. Make sure you discuss any questions you have   with your health care provider. Document Revised: 06/21/2017 Document Reviewed: 12/26/2015 Elsevier Patient Education  2020 [place] on my medicine - ELIQUIS (apixaban)  This medication education was reviewed with me or my healthcare representative as part of my discharge preparation.  The pharmacist that spoke with me during my hospital stay was:  Ramond Craver, [facility]  Why was Eliquis prescribed for you? Eliquis was prescribed for you to reduce the risk of a  blood clot forming that can cause a stroke if you have a medical condition called atrial fibrillation (a type of irregular heartbeat).  What do You need to know about Eliquis ? Take your Eliquis TWICE DAILY - one tablet in the morning and one tablet in the evening with or without food. If you have difficulty swallowing the tablet whole please discuss with your pharmacist how to take the medication safely.  Take Eliquis exactly as prescribed by your doctor and DO NOT stop taking Eliquis without talking to the doctor who prescribed the medication.  Stopping may increase your risk of developing a stroke.  Refill your prescription before you run out.  After discharge, you should have regular check-up appointments with your healthcare provider that is prescribing your Eliquis.  In the future your dose may need to be changed if your kidney function or weight changes by a significant amount or as you get older.  What do you do if you miss a dose? If you miss a dose, take it as soon as you remember on the same day and resume taking twice daily.  Do not take more than one dose of ELIQUIS at the same time to make up a missed dose.  Important Safety Information A possible side effect of Eliquis is bleeding. You should call your healthcare provider right away if you experience any of the following: ? Bleeding from an injury or your nose that does not stop. ? Unusual colored urine (red or dark brown) or unusual colored stools (red or black). ? Unusual bruising for unknown reasons. ? A serious fall or if you hit your head (even if there is no bleeding).  Some medicines may interact with Eliquis and might increase your risk of bleeding or clotting while on Eliquis. To help avoid this, consult your healthcare provider or pharmacist prior to using any new prescription or non-prescription medications, including herbals, vitamins, non-steroidal anti-inflammatory drugs (NSAIDs) and supplements.  This  website has more information on Eliquis (apixaban): http://www.eliquis.com/eliquis/home

## 2020-03-03 NOTE — Progress Notes (Signed)
1 Day Post-Op  Subjective: Feels much better. Denies any incisional pain, nausea. Tolerating regular diet well.  Objective: Vital signs in last 24 hours: Temp:  [97.5 F (36.4 C)-98.9 F (37.2 C)] 97.6 F (36.4 C) (08/12 0500) Pulse Rate:  [61-103] 68 (08/12 0500) Resp:  [12-20] 18 (08/12 0500) BP: (95-123)/(30-77) 100/54 (08/12 0500) SpO2:  [97 %-100 %] 99 % (08/12 0500) Weight:  [973 kg] 137 kg (08/11 1017) Last BM Date: 03/02/20  Intake/Output from previous day: 08/11 0701 - 08/12 0700 In: 1160 [P.O.:160; I.V.:1000] Out: 10 [Blood:10] Intake/Output this shift: No intake/output data recorded.  General appearance: alert, cooperative and no distress GI: Soft, incisions healing well.  Lab Results:  Recent Labs    03/02/20 0522 03/03/20 0448  WBC 9.1 13.4*  HGB 13.0 12.8*  HCT 40.1 39.3  PLT 312 355   BMET Recent Labs    03/02/20 0522 03/03/20 0448  NA 136 137  K 3.4* 3.8  CL 107 106  CO2 20* 22  GLUCOSE 120* 136*  BUN 25* 19  CREATININE 0.92 0.96  CALCIUM 8.3* 8.5*   PT/INR No results for input(s): LABPROT, INR in the last 72 hours.  Studies/Results: ECHOCARDIOGRAM LIMITED  Result Date: 03/01/2020    ECHOCARDIOGRAM LIMITED REPORT   Patient Name:   Ryan Garner Date of Exam: 03/01/2020 Medical Rec #:  [id number]    Height:       66.5 in Accession #:    [id number]   Weight:       302.2 lb Date of Birth:  1945/09/26     BSA:          2.397 m Patient Age:    74 years     BP:           110/51 mmHg Patient Gender: M            HR:           89 bpm. Exam Location:  [facility] Procedure: Limited Echo STAT ECHO Indications:    Preoperative evaluation  History:        Patient has prior history of Echocardiogram examinations, most                 recent 02/29/2020. Arrythmias:Atrial Fibrillation; Risk                 Factors:Hypertension, Diabetes and Dyslipidemia. Morbid obesity.  Sonographer:    Alvino Chapel RCS Referring Phys: [place]  1. Limited  echo for EF. Normal LV size Mild LVH normal EF 65% with no RWMAls Definity used for endocardial border definition. FINDINGS  Left Ventricle: Definity contrast agent was given IV to delineate the left ventricular endocardial borders. Additional Comments: Limited echo for EF. Normal LV size Mild LVH normal EF 65% with no RWMAls Definity used for endocardial border definition. LEFT VENTRICLE PLAX 2D LVIDd:         5.11 cm LVIDs:         3.56 cm LV PW:         1.11 cm LV IVS:        1.30 cm  LV Volumes (MOD) LV vol d, MOD A2C: 76.9 ml LV vol d, MOD A4C: 143.0 ml LV vol s, MOD A2C: 31.7 ml LV vol s, MOD A4C: 46.4 ml LV SV MOD A2C:     45.2 ml LV SV MOD A4C:     143.0 ml LV SV MOD BP:  71.8 ml LEFT ATRIUM         Index LA diam:    4.00 cm 1.67 cm/m   AORTA Ao Root diam: 3.60 cm Viki Carrera Rouge MD Electronically signed by Keeli Roberg Rouge MD Signature Date/Time: 03/01/2020/9:11:56 AM    Final     Anti-infectives: Anti-infectives (From admission, onward)   Start     Dose/Rate Route Frequency Ordered Stop   02/29/20 0800  piperacillin-tazobactam (ZOSYN) IVPB 3.375 g     Discontinue     3.375 g 12.5 mL/hr over 240 Minutes Intravenous Every 8 hours 02/29/20 0719     02/29/20 0730  piperacillin-tazobactam (ZOSYN) IVPB 3.375 g  Status:  Discontinued        3.375 g 100 mL/hr over 30 Minutes Intravenous Every 8 hours 02/29/20 0718 02/29/20 0719   02/28/20 2230  piperacillin-tazobactam (ZOSYN) IVPB 3.375 g        3.375 g 100 mL/hr over 30 Minutes Intravenous  Once 02/28/20 2228 02/29/20 0522      Assessment/Plan: s/p Procedure(s): LAPAROSCOPIC CHOLECYSTECTOMY Impression: Stable postoperatively. Okay for discharge from surgery standpoint. Would discharge on 5 days of Augmentin. May restart Xarelto. Patient does not want pain medications and will take Aleve at home. Follow-up in my office on 03/15/2020.  LOS: 4 days    [facility] Signs 03/03/2020

## 2020-03-03 NOTE — Telephone Encounter (Signed)
TRANSITIONAL CARE MANAGEMENT TELEPHONE OUTREACH NOTE   Contact Date: 03/03/2020 Contacted By: Truett Mainland, LPN  DISCHARGE INFORMATION Date of Discharge:03/03/2020  Discharge Facility: [facility]  Outpatient Follow Up Recommendations (copied from discharge summary) 1. Follow up with PCP in 1-2 weeks 2. Continue Augmentin 875-125 mg 1 tab twice daily for 5 days  Gregor Dershem is a male primary care patient of [name], [name], MD. An outgoing telephone call was made today and I spoke with Mr. Delfavero.  Mr. Gamero condition(s) and treatment(s) were discussed. An opportunity to ask questions was provided and all were answered or forwarded as appropriate.    Portal lives with their spouse and he can perform ADLs independently. his primary caregiver is himself. he is able to depend on his primary caregiver(s) for consistent help. Transportation to appointments, to pick up medications, and to run errands is not a problem.  (Consider referral to [facility] if transportation or a consistent caregiver is a problem)   Fall Risk Fall Risk  04/02/2019 04/01/2019  Falls in the past year? 0 0  Number falls in past yr: 0 -  Injury with Fall? 0 -  Follow up Falls prevention discussed -  Comment Get rid of all throw rugs in the house, adequate lighting in the walkways and grab bars in the bathroom -    low [place] Modifications/Assistive Devices Wheelchair: No Cane: No Ramp: No Bedside Toilet: No Hospital Bed:  No Other:    [place] he is not receiving home health services.     MEDICATION RECONCILIATION  Mr. Bungert has been able to pick-up all prescribed discharge medications from the pharmacy.   A post discharge medication reconciliation was performed and the complete medication list was reviewed with the patient/caregiver and is current as of 03/03/2020. Changes highlighted  below.  Discontinued Medications STOP taking these medications       lisinopril-hydrochlorothiazide 10-12.5 MG tablet Commonly known as: ZESTORETIC     Current Medication List Allergies as of 03/03/2020      Reactions   Rofecoxib Other (See Comments)   (Vioxx)      Medication List       Accurate as of March 03, 2020  2:55 PM. If you have any questions, ask your nurse or doctor.        albuterol 108 (90 Base) MCG/ACT inhaler Commonly known as: VENTOLIN HFA Inhale 2 puffs into the lungs every 6 (six) hours as needed for wheezing or shortness of breath.   amoxicillin-clavulanate 875-125 MG tablet Commonly known as: Augmentin Take 1 tablet by mouth 2 (two) times daily for 5 days.   apixaban 5 MG Tabs tablet Commonly known as: ELIQUIS Take 1 tablet (5 mg total) by mouth 2 (two) times daily.   fluticasone 50 MCG/ACT nasal spray Commonly known as: FLONASE Place 2 sprays into both nostrils daily. What changed:   when to take this  reasons to take this   metoprolol tartrate 25 MG tablet Commonly known as: LOPRESSOR Take 0.5 tablets (12.5 mg total) by mouth 2 (two) times daily.   multivitamin with minerals Tabs tablet Take 1 tablet by mouth daily.   ondansetron 4 MG disintegrating tablet Commonly known as: Zofran ODT Take 1 tablet (4 mg total) by mouth every 8 (eight) hours as needed for nausea or vomiting.   traMADol 50 MG tablet Commonly known as: ULTRAM Take 1 tablet (50  mg total) by mouth every 8 (eight) hours as needed for up to 5 days (mild pain).        PATIENT EDUCATION & FOLLOW-UP PLAN  An appointment for Transitional Care Management is scheduled with Onyeje M. Juluis Rainier, FNP on Thursday 03/17/20 at 900am.  Take all medications as prescribed  Contact our office by calling [phone] if you have any questions or concerns

## 2020-03-04 LAB — SURGICAL PATHOLOGY

## 2020-03-07 ENCOUNTER — Telehealth: Payer: Medicare Other

## 2020-03-07 ENCOUNTER — Ambulatory Visit (INDEPENDENT_AMBULATORY_CARE_PROVIDER_SITE_OTHER): Payer: Medicare Other | Admitting: Nurse Practitioner

## 2020-03-07 ENCOUNTER — Encounter: Payer: Self-pay | Admitting: Nurse Practitioner

## 2020-03-07 DIAGNOSIS — R05 Cough: Secondary | ICD-10-CM

## 2020-03-07 DIAGNOSIS — R059 Cough, unspecified: Secondary | ICD-10-CM

## 2020-03-07 MED ORDER — AZITHROMYCIN 250 MG PO TABS
ORAL_TABLET | ORAL | 0 refills | Status: DC
Start: 2020-03-07 — End: 2020-03-15

## 2020-03-07 NOTE — Progress Notes (Signed)
Virtual Visit via telephone Note Due to COVID-19 pandemic this visit was conducted virtually. This visit type was conducted due to national recommendations for restrictions regarding the COVID-19 Pandemic (e.g. social distancing, sheltering in place) in an effort to limit this patient's exposure and mitigate transmission in our community. All issues noted in this document were discussed and addressed.  A physical exam was not performed with this format.  I connected with Ryan Garner on 03/07/20 at 1:35 by telephone and verified that I am speaking with the correct person using two identifiers. Ryan Garner is currently located at home and no one is currently with him during visit. The provider, Mary-Margaret Hassell Done, FNP is located in their office at time of visit.  I discussed the limitations, risks, security and privacy concerns of performing an evaluation and management service by telephone and the availability of in person appointments. I also discussed with the patient that there may be a patient responsible charge related to this service. The patient expressed understanding and agreed to proceed.   History and Present Illness:   Chief Complaint: Cough   HPI Patient had his gallbladder taken out last week. Since then he has developed a cough and tightness. He has been using his breathing apparatus and has been coughing and deep breathing every 2 hours. He ran a low grade fever  Last night.    Review of Systems  Constitutional: Positive for chills and fever.  HENT: Positive for congestion. Negative for sinus pain and sore throat.   Respiratory: Positive for cough and sputum production.   Neurological: Positive for headaches.  Psychiatric/Behavioral: Negative.   All other systems reviewed and are negative.    Observations/Objective: Alert and oriented- answers all questions appropriately No distress Voice hoarse Cough sound wet.   Assessment and Plan: Ryan Garner in today  with chief complaint of Cough   1. Cough 1. Take meds as prescribed 2. Use a cool mist humidifier especially during the winter months and when heat has been humid. 3. Use saline nose sprays frequently 4. Saline irrigations of the nose can be very helpful if done frequently.  * 4X daily for 1 week*  * Use of a nettie pot can be helpful with this. Follow directions with this* 5. Drink plenty of fluids 6. Keep thermostat turn down low 7.For any cough or congestion  Use plain Mucinex- regular strength or max strength is fine   * Children- consult with Pharmacist for dosing 8. For fever or aces or pains- take tylenol or ibuprofen appropriate for age and weight.  * for fevers greater than 101 orally you may alternate ibuprofen and tylenol every  3 hours.    Meds ordered this encounter  Medications  . azithromycin (ZITHROMAX Z-PAK) 250 MG tablet    Sig: As directed    Dispense:  6 tablet    Refill:  0    Order Specific Question:   Supervising Provider    Answer:   Caryl Pina A [[id number]]        Follow Up Instructions: prn    I discussed the assessment and treatment plan with the patient. The patient was provided an opportunity to ask questions and all were answered. The patient agreed with the plan and demonstrated an understanding of the instructions.   The patient was advised to call back or seek an in-person evaluation if the symptoms worsen or if the condition fails to improve as anticipated.  The above assessment and management plan was discussed with  the patient. The patient verbalized understanding of and has agreed to the management plan. Patient is aware to call the clinic if symptoms persist or worsen. Patient is aware when to return to the clinic for a follow-up visit. Patient educated on when it is appropriate to go to the emergency department.   Time call ended:  1:50  I provided 15 minutes of non-face-to-face time during this encounter.    Mary-Margaret  Hassell Done, FNP

## 2020-03-09 ENCOUNTER — Ambulatory Visit: Payer: Medicare Other | Admitting: *Deleted

## 2020-03-09 DIAGNOSIS — I10 Essential (primary) hypertension: Secondary | ICD-10-CM

## 2020-03-09 DIAGNOSIS — E1169 Type 2 diabetes mellitus with other specified complication: Secondary | ICD-10-CM

## 2020-03-09 NOTE — Patient Instructions (Signed)
  Follow up plan: Appt with PCP office 03/17/20 Appt with Dr Arnoldo Morale 03/15/20 Appt with Theadore Nan, LCSW 03/22/20 appt with RN Care Manager 04/27/20  Chong Sicilian, BSN, RN-BC [place] / [place] Management Direct Dial: [phone]

## 2020-03-09 NOTE — Chronic Care Management (AMB) (Signed)
  Chronic Care Management   Follow-up Note (Red EMMI Flag) 03/09/2020 Name: Ryan Garner MRN: [id number] DOB: [birth year]  I reached out to Mr Kenley today regarding a red flag from an automated EMMI call on 03/08/20. Mr Scoggin has diabetes and hypertension and was hospitalized for a gangrenous gallbladder and gallstones and had gallbladder removed by Dr Arnoldo Morale on 03/02/20.  Patient had reported that he had additional questions/problems but today says that he spoke with his provider and no longer has any questions/problems. I encouraged patient to reach out to provider or CCM team with any questions or concerns.    Follow up plan: Appt with PCP office 03/17/20 Appt with Dr Arnoldo Morale 03/15/20 Appt with Theadore Nan, LCSW 03/22/20 appt with RN Care Manager 04/27/20  Chong Sicilian, BSN, RN-BC [place] / [place] Management Direct Dial: [phone]

## 2020-03-11 ENCOUNTER — Inpatient Hospital Stay: Payer: Medicare Other | Admitting: Family Medicine

## 2020-03-12 DIAGNOSIS — R0602 Shortness of breath: Secondary | ICD-10-CM | POA: Diagnosis not present

## 2020-03-12 DIAGNOSIS — I509 Heart failure, unspecified: Secondary | ICD-10-CM | POA: Diagnosis not present

## 2020-03-12 DIAGNOSIS — I110 Hypertensive heart disease with heart failure: Secondary | ICD-10-CM | POA: Diagnosis not present

## 2020-03-15 ENCOUNTER — Ambulatory Visit (INDEPENDENT_AMBULATORY_CARE_PROVIDER_SITE_OTHER): Payer: Self-pay | Admitting: General Surgery

## 2020-03-15 ENCOUNTER — Encounter: Payer: Self-pay | Admitting: General Surgery

## 2020-03-15 ENCOUNTER — Other Ambulatory Visit: Payer: Self-pay

## 2020-03-15 VITALS — BP 113/80 | HR 78 | Temp 97.8°F | Resp 12 | Ht 68.0 in | Wt 299.0 lb

## 2020-03-15 DIAGNOSIS — Z09 Encounter for follow-up examination after completed treatment for conditions other than malignant neoplasm: Secondary | ICD-10-CM

## 2020-03-15 NOTE — Progress Notes (Signed)
Subjective:     Ryan Garner  Here for postoperative visit and staple removal, status post laparoscopic cholecystectomy.  Patient states he is doing well.  He denies any significant incisional pain.  He denies any fever or chills.  He has already restarted his Eliquis. Objective:    BP 113/80   Pulse 78   Temp 97.8 F (36.6 C) (Oral)   Resp 12   Ht 5\' 8"  (1.727 m)   Wt 299 lb (135.6 kg)   SpO2 98%   BMI 45.46 kg/m   General:  alert, cooperative and no distress  Abdomen soft, incisions healing well.  Staples removed, Steri-Strips applied. Final pathology consistent with diagnosis.     Assessment:    Doing well postoperatively.    Plan:   May resume normal activity.  Follow-up here as needed.

## 2020-03-17 ENCOUNTER — Ambulatory Visit (INDEPENDENT_AMBULATORY_CARE_PROVIDER_SITE_OTHER): Payer: Medicare Other | Admitting: Nurse Practitioner

## 2020-03-17 ENCOUNTER — Other Ambulatory Visit: Payer: Self-pay

## 2020-03-17 ENCOUNTER — Encounter: Payer: Self-pay | Admitting: Nurse Practitioner

## 2020-03-17 VITALS — BP 105/66 | HR 83 | Temp 97.6°F | Ht 68.0 in | Wt 299.2 lb

## 2020-03-17 DIAGNOSIS — K810 Acute cholecystitis: Secondary | ICD-10-CM | POA: Diagnosis not present

## 2020-03-17 DIAGNOSIS — Z09 Encounter for follow-up examination after completed treatment for conditions other than malignant neoplasm: Secondary | ICD-10-CM | POA: Diagnosis not present

## 2020-03-17 DIAGNOSIS — I10 Essential (primary) hypertension: Secondary | ICD-10-CM | POA: Diagnosis not present

## 2020-03-17 NOTE — Progress Notes (Signed)
Established Patient Office Visit  Subjective:  Patient ID: Ryan Garner, male    DOB: [birth year]  Age: 74 y.o. MRN: [id number]  CC:  Chief Complaint  Patient presents with  . Hospitalization Follow-up    HPI Ryan Garner presents for Today's visit was for Transitional Care Management.  The patient was discharged from [facility] on 03/03/2020 with a primary diagnosis of acute kidney injury.   Contact with the patient and/or caregiver, by a clinical staff member, was made on 03/03/2020 and was documented as a telephone encounter within the EMR.  Through chart review and discussion with the patient I have determined that management of their condition is of moderate complexity.    Patient presents for follow up of hypertension. Patient was diagnosed few years ago. Patient developed dehydration and volume depletion with ACE inhibitor use. Lisinopril, HCTZ. Patient is currently on metoprolol 12.5 mg twice daily. Patient reports compliance with medication, including taking medication as directed , maintains a healthy diet, and following up as directed.    Past Medical History:  Diagnosis Date  . Hyperlipidemia   . Hypertension   . Metabolic syndrome   . Metabolic syndrome   . Prediabetes     Past Surgical History:  Procedure Laterality Date  . APPENDECTOMY    . CHOLECYSTECTOMY N/A 03/02/2020   Procedure: LAPAROSCOPIC CHOLECYSTECTOMY;  Surgeon: Aviva Signs, MD;  Location: [facility];  Service: General;  Laterality: N/A;  . Eyelid Surgery Bilateral   . HERNIA REPAIR    . knee tendons repair    . ROTATOR CUFF REPAIR Bilateral   . TONSILLECTOMY      Family History  Problem Relation Age of Onset  . Cancer Mother        lungs  . Coronary artery disease Mother   . Diabetes Brother   . Heart attack Brother   . Diabetes Maternal Grandmother   . Arthritis Father     Social History   Socioeconomic History  . Marital status: Married    Spouse name: [name]  . Number of children:  2  . Years of education: 22  . Highest education level: Doctorate  Occupational History  . 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 Smoking status: Former Smoker    Types: Cigarettes    Quit date: 04/02/1959    Years since quitting: 61.0  . Smokeless tobacco: Never Used  Vaping Use  . Vaping Use: Never used  Substance and Sexual Activity  . Alcohol use: No  . Drug use: No  . Sexual activity: Yes  Other Topics Concern  . Not on file  Social History Narrative  . Not on file   Social Determinants of Health   Financial Resource Strain:   . Difficulty of Paying Living Expenses: Not on file  Food Insecurity:   . Worried About Charity fundraiser in the Last Year: Not on file  . Ran Out of Food in the Last Year: Not on file  Transportation Needs:   . Lack of Transportation (Medical): Not on file  . Lack of Transportation (Non-Medical): Not on file  Physical Activity:   . Days of Exercise per Week: Not on file  . Minutes of Exercise per Session: Not on file  Stress: Stress Concern Present  . Feeling of Stress : To some extent  Social Connections:   . Frequency of Communication with Friends and  Family: Not on file  . Frequency of Social Gatherings with Friends and Family: Not on file  . Attends Religious Services: Not on file  . Active Member of Clubs or Organizations: Not on file  . Attends Archivist Meetings: Not on file  . Marital Status: Not on file  Intimate Partner Violence:   . Fear of Current or Ex-Partner: Not on file  . Emotionally Abused: Not on file  . Physically Abused: Not on file  . Sexually Abused: Not on file    Outpatient Medications Prior to Visit  Medication Sig Dispense Refill  . albuterol (VENTOLIN HFA) 108 (90 Base) MCG/ACT inhaler Inhale 2 puffs into the lungs every 6 (six) hours as needed for wheezing or shortness of breath. 18 g 0  . apixaban  (ELIQUIS) 5 MG TABS tablet Take 1 tablet (5 mg total) by mouth 2 (two) times daily. 60 tablet 2  . fluticasone (FLONASE) 50 MCG/ACT nasal spray Place 2 sprays into both nostrils daily. (Patient taking differently: Place 2 sprays into both nostrils daily as needed for allergies. ) 16 g 6  . metoprolol tartrate (LOPRESSOR) 25 MG tablet Take 0.5 tablets (12.5 mg total) by mouth 2 (two) times daily. 60 tablet 1  . Multiple Vitamin (MULTIVITAMIN WITH MINERALS) TABS tablet Take 1 tablet by mouth daily.    . ondansetron (ZOFRAN ODT) 4 MG disintegrating tablet Take 1 tablet (4 mg total) by mouth every 8 (eight) hours as needed for nausea or vomiting. (Patient not taking: Reported on 03/17/2020) 30 tablet 1   No facility-administered medications prior to visit.    Allergies  Allergen Reactions  . Rofecoxib Other (See Comments)    (Vioxx)    ROS Review of Systems  Constitutional: Negative.   HENT: Negative.   Respiratory: Negative.   Gastrointestinal: Positive for nausea.       Post cholecystectomy 03/02/2020  Endocrine: Positive for cold intolerance.       New for patient after starting Eliquis.  Genitourinary: Negative.   Musculoskeletal: Negative.   Skin: Negative.   Neurological: Positive for weakness.      Objective:    Physical Exam Constitutional:      Appearance: Normal appearance. He is obese.  HENT:     Head: Normocephalic.  Eyes:     Conjunctiva/sclera: Conjunctivae normal.  Cardiovascular:     Rate and Rhythm: Normal rate and regular rhythm.     Pulses: Normal pulses.     Heart sounds: Normal heart sounds.  Pulmonary:     Effort: Pulmonary effort is normal.     Breath sounds: Normal breath sounds.  Abdominal:     General: Bowel sounds are normal.     Tenderness: There is abdominal tenderness. There is no guarding.  Musculoskeletal:        General: Tenderness present.  Skin:    General: Skin is warm.     Findings: Erythema present.     Comments: Healing stich  site post cholecystectomy.  Neurological:     Mental Status: He is alert and oriented to person, place, and time.     BP 105/66   Pulse 83   Temp 97.6 F (36.4 C)   Ht 5\' 8"  (1.727 m)   Wt 299 lb 3.2 oz (135.7 kg)   SpO2 98%   BMI 45.49 kg/m  Wt Readings from Last 3 Encounters:  03/17/20 299 lb 3.2 oz (135.7 kg)  03/15/20 299 lb (135.6 kg)  03/02/20 (!) 302 lb 0.5 oz (  137 kg)     Health Maintenance Due  Topic Date Due  . OPHTHALMOLOGY EXAM  Never done  . COVID-19 Vaccine (1) Never done  . COLONOSCOPY  08/26/2014  . URINE MICROALBUMIN  01/17/2018  . FOOT EXAM  12/20/2018  . INFLUENZA VACCINE  02/21/2020      Lab Results  Component Value Date   TSH 0.599 02/28/2020   Lab Results  Component Value Date   WBC 13.4 (H) 03/03/2020   HGB 12.8 (L) 03/03/2020   HCT 39.3 03/03/2020   MCV 89.9 03/03/2020   PLT 355 03/03/2020   Lab Results  Component Value Date   NA 137 03/03/2020   K 3.8 03/03/2020   CO2 22 03/03/2020   GLUCOSE 136 (H) 03/03/2020   BUN 19 03/03/2020   CREATININE 0.96 03/03/2020   BILITOT 0.5 03/03/2020   ALKPHOS 48 03/03/2020   AST 36 03/03/2020   ALT 36 03/03/2020   PROT 5.5 (L) 03/03/2020   ALBUMIN 2.6 (L) 03/03/2020   CALCIUM 8.5 (L) 03/03/2020   ANIONGAP 9 03/03/2020   Lab Results  Component Value Date   CHOL 166 09/04/2018   Lab Results  Component Value Date   HDL 42 09/04/2018   Lab Results  Component Value Date   LDLCALC 108 (H) 09/04/2018   Lab Results  Component Value Date   TRIG 82 09/04/2018   Lab Results  Component Value Date   CHOLHDL 4.0 09/04/2018   Lab Results  Component Value Date   HGBA1C 6.0 (H) 03/01/2020      Assessment & Plan:   Problem List Items Addressed This Visit      Cardiovascular and Mediastinum   Hypertension    Patient is following up for hypertension. Patient was diagnosed few years ago. Patient developed dehydration and volume depletion with ACE inhibitor use. Lisinopril, HCTZ.  Patient is currently on metoprolol 12.5 mg twice daily after discontinuing ACE inhibitor. Patient reports compliance with medication including taking medication as directed, maintains a healthy diet and follows up as directed. Advised patient to continue taking blood pressure log as he started new medication 2 weeks ago. Patient blood pressure reading today systolic 956, diastolic 66.  follow-up in 3 months.        Digestive   Acute gangrenous cholecystitis    Patient was admitted to [facility] for acute gangrenous cholecystitis. Patient is reporting no pain today, slight queasiness, patient has Zofran to control nausea. Provided education to patient to use medication as directed. Overall patient is reporting well-managed symptoms. Laparoscopic surgical site is without signs and symptoms of infection.         Other   Hospital discharge follow-up - Primary    Patient is a 74 year old male who presents to clinic for hospital discharge follow-up. Patient was admitted to [facility] 02/28/2020. Patient was admitted for acute kidney injury, dehydration and volume depletion, patient had a gangrenous gallbladder and underwent a laparoscopic cholecystectomy. ACE inhibitor,'s,  lisinopril, HCTZ was discontinued. Patient started metoprolol 12.5 mg twice daily for blood pressure control. Completed all discharge instructions with medication reconciliation. Provided education to patient, with printed handouts given. Patient verbalized understanding. No medication refills ordered today, no labs drawn today. Patient had questions about the cost of Eliquis. Almyra Free our pharmacist has been consulted and will get back to patient ASAP. Patient is not reporting any pain today from surgical site, patient is reporting mild queasiness which is controlled with Zofran. Patient completed course of antibiotic 03/16/2020. Patient  already completed postop surgical follow-up 03/15/2020. Advised patient to follow-up in  3 months to check CBG, continue on diet modification and lifestyle changes.           No orders of the defined types were placed in this encounter.   Follow-up: Return in about 3 months (around 06/17/2020).    Ivy Lynn, NP

## 2020-03-17 NOTE — Assessment & Plan Note (Signed)
Patient is a 74 year old male who presents to clinic for hospital discharge follow-up. Patient was admitted to [facility] 02/28/2020. Patient was admitted for acute kidney injury, dehydration and volume depletion, patient had a gangrenous gallbladder and underwent a laparoscopic cholecystectomy. ACE inhibitor,'s,  lisinopril, HCTZ was discontinued. Patient started metoprolol 12.5 mg twice daily for blood pressure control. Completed all discharge instructions with medication reconciliation. Provided education to patient, with printed handouts given. Patient verbalized understanding. No medication refills ordered today, no labs drawn today. Patient had questions about the cost of Eliquis. Almyra Free our pharmacist has been consulted and will get back to patient ASAP. Patient is not reporting any pain today from surgical site, patient is reporting mild queasiness which is controlled with Zofran. Patient completed course of antibiotic 03/16/2020. Patient already completed postop surgical follow-up 03/15/2020. Advised patient to follow-up in 3 months to check CBG, continue on diet modification and lifestyle changes.

## 2020-03-17 NOTE — Assessment & Plan Note (Signed)
Patient is following up for hypertension. Patient was diagnosed few years ago. Patient developed dehydration and volume depletion with ACE inhibitor use. Lisinopril, HCTZ. Patient is currently on metoprolol 12.5 mg twice daily after discontinuing ACE inhibitor. Patient reports compliance with medication including taking medication as directed, maintains a healthy diet and follows up as directed. Advised patient to continue taking blood pressure log as he started new medication 2 weeks ago. Patient blood pressure reading today systolic 932, diastolic 66.  follow-up in 3 months.

## 2020-03-17 NOTE — Patient Instructions (Addendum)
Laparoscopic Cholecystectomy Laparoscopic cholecystectomy is surgery to remove the gallbladder. The gallbladder is a pear-shaped organ that lies beneath the liver on the right side of the body. The gallbladder stores bile, which is a fluid that helps the body to digest fats. Cholecystectomy is often done for inflammation of the gallbladder (cholecystitis). This condition is usually caused by a buildup of gallstones (cholelithiasis) in the gallbladder. Gallstones can block the flow of bile, which can result in inflammation and pain. In severe cases, emergency surgery may be required. This procedure is done though small incisions in your abdomen (laparoscopic surgery). A thin scope with a camera (laparoscope) is inserted through one incision. Thin surgical instruments are inserted through the other incisions. In some cases, a laparoscopic procedure may be turned into a type of surgery that is done through a larger incision (open surgery). Tell a health care provider about:  Any allergies you have.  All medicines you are taking, including vitamins, herbs, eye drops, creams, and over-the-counter medicines.  Any problems you or family members have had with anesthetic medicines.  Any blood disorders you have.  Any surgeries you have had.  Any medical conditions you have.  Whether you are pregnant or may be pregnant. What are the risks? Generally, this is a safe procedure. However, problems may occur, including:  Infection.  Bleeding.  Allergic reactions to medicines.  Damage to other structures or organs.  A stone remaining in the common bile duct. The common bile duct carries bile from the gallbladder into the small intestine.  A bile leak from the cyst duct that is clipped when your gallbladder is removed. What happens before the procedure? Staying hydrated Follow instructions from your health care provider about hydration, which may include:  Up to 2 hours before the procedure - you  may continue to drink clear liquids, such as water, clear fruit juice, black coffee, and plain tea. Eating and drinking restrictions Follow instructions from your health care provider about eating and drinking, which may include:  8 hours before the procedure - stop eating heavy meals or foods such as meat, fried foods, or fatty foods.  6 hours before the procedure - stop eating light meals or foods, such as toast or cereal.  6 hours before the procedure - stop drinking milk or drinks that contain milk.  2 hours before the procedure - stop drinking clear liquids. Medicines  Ask your health care provider about: ? Changing or stopping your regular medicines. This is especially important if you are taking diabetes medicines or blood thinners. ? Taking medicines such as aspirin and ibuprofen. These medicines can thin your blood. Do not take these medicines before your procedure if your health care provider instructs you not to.  You may be given antibiotic medicine to help prevent infection. General instructions  Let your health care provider know if you develop a cold or an infection before surgery.  Plan to have someone take you home from the hospital or clinic.  Ask your health care provider how your surgical site will be marked or identified. What happens during the procedure?   To reduce your risk of infection: ? Your health care team will wash or sanitize their hands. ? Your skin will be washed with soap. ? Hair may be removed from the surgical area.  An IV tube may be inserted into one of your veins.  You will be given one or more of the following: ? A medicine to help you relax (sedative). ? A   medicine to make you fall asleep (general anesthetic).  A breathing tube will be placed in your mouth.  Your surgeon will make several small cuts (incisions) in your abdomen.  The laparoscope will be inserted through one of the small incisions. The camera on the laparoscope will  send images to a TV screen (monitor) in the operating room. This lets your surgeon see inside your abdomen.  Air-like gas will be pumped into your abdomen. This will expand your abdomen to give the surgeon more room to perform the surgery.  Other tools that are needed for the procedure will be inserted through the other incisions. The gallbladder will be removed through one of the incisions.  Your common bile duct may be examined. If stones are found in the common bile duct, they may be removed.  After your gallbladder has been removed, the incisions will be closed with stitches (sutures), staples, or skin glue.  Your incisions may be covered with a bandage (dressing). The procedure may vary among health care providers and hospitals. What happens after the procedure?  Your blood pressure, heart rate, breathing rate, and blood oxygen level will be monitored until the medicines you were given have worn off.  You will be given medicines as needed to control your pain.  Do not drive for 24 hours if you were given a sedative. This information is not intended to replace advice given to you by your health care provider. Make sure you discuss any questions you have with your health care provider. Document Revised: 06/21/2017 Document Reviewed: 12/26/2015 Elsevier Patient Education  2020 [place] [employer] Calories are units of energy. Your body needs a certain amount of calories from food to keep you going throughout the day. When you eat more calories than your body needs, your body stores the extra calories as fat. When you eat fewer calories than your body needs, your body burns fat to get the energy it needs. Calorie counting means keeping track of how many calories you eat and drink each day. Calorie counting can be helpful if you need to lose weight. If you make sure to eat fewer calories than your body needs, you should lose weight. Ask your health care provider  what a healthy weight is for you. For calorie counting to work, you will need to eat the right number of calories in a day in order to lose a healthy amount of weight per week. A dietitian can help you determine how many calories you need in a day and will give you suggestions on how to reach your calorie goal.  A healthy amount of weight to lose per week is usually 1-2 lb (0.5-0.9 kg). This usually means that your daily calorie intake should be reduced by 500-750 calories.  Eating 1,200 - 1,500 calories per day can help most women lose weight.  Eating 1,500 - 1,800 calories per day can help most men lose weight. What is my plan? My goal is to have __________ calories per day. If I have this many calories per day, I should lose around __________ pounds per week. What do I need to know about calorie counting? In order to meet your daily calorie goal, you will need to:  Find out how many calories are in each food you would like to eat. Try to do this before you eat.  Decide how much of the food you plan to eat.  Write down what you ate and how many calories it  had. Doing this is called keeping a food log. To successfully lose weight, it is important to balance calorie counting with a healthy lifestyle that includes regular activity. Aim for 150 minutes of moderate exercise (such as walking) or 75 minutes of vigorous exercise (such as running) each week. Where do I find calorie information?  The number of calories in a food can be found on a Nutrition Facts label. If a food does not have a Nutrition Facts label, try to look up the calories online or ask your dietitian for help. Remember that calories are listed per serving. If you choose to have more than one serving of a food, you will have to multiply the calories per serving by the amount of servings you plan to eat. For example, the label on a package of bread might say that a serving size is 1 slice and that there are 90 calories in a  serving. If you eat 1 slice, you will have eaten 90 calories. If you eat 2 slices, you will have eaten 180 calories. How do I keep a food log? Immediately after each meal, record the following information in your food log:  What you ate. Don't forget to include toppings, sauces, and other extras on the food.  How much you ate. This can be measured in cups, ounces, or number of items.  How many calories each food and drink had.  The total number of calories in the meal. Keep your food log near you, such as in a small notebook in your pocket, or use a mobile app or website. Some programs will calculate calories for you and show you how many calories you have left for the day to meet your goal. What are some calorie counting tips?   Use your calories on foods and drinks that will fill you up and not leave you hungry: ? Some examples of foods that fill you up are nuts and nut butters, vegetables, lean proteins, and high-fiber foods like whole grains. High-fiber foods are foods with more than 5 g fiber per serving. ? Drinks such as sodas, specialty coffee drinks, alcohol, and juices have a lot of calories, yet do not fill you up.  Eat nutritious foods and avoid empty calories. Empty calories are calories you get from foods or beverages that do not have many vitamins or protein, such as candy, sweets, and soda. It is better to have a nutritious high-calorie food (such as an avocado) than a food with few nutrients (such as a bag of chips).  Know how many calories are in the foods you eat most often. This will help you calculate calorie counts faster.  Pay attention to calories in drinks. Low-calorie drinks include water and unsweetened drinks.  Pay attention to nutrition labels for "low fat" or "fat free" foods. These foods sometimes have the same amount of calories or more calories than the full fat versions. They also often have added sugar, starch, or salt, to make up for flavor that was removed  with the fat.  Find a way of tracking calories that works for you. Get creative. Try different apps or programs if writing down calories does not work for you. What are some portion control tips?  Know how many calories are in a serving. This will help you know how many servings of a certain food you can have.  Use a measuring cup to measure serving sizes. You could also try weighing out portions on a kitchen scale. With time, you  will be able to estimate serving sizes for some foods.  Take some time to put servings of different foods on your favorite plates, bowls, and cups so you know what a serving looks like.  Try not to eat straight from a bag or box. Doing this can lead to overeating. Put the amount you would like to eat in a cup or on a plate to make sure you are eating the right portion.  Use smaller plates, glasses, and bowls to prevent overeating.  Try not to multitask (for example, watch TV or use your computer) while eating. If it is time to eat, sit down at a table and enjoy your food. This will help you to know when you are full. It will also help you to be aware of what you are eating and how much you are eating. What are tips for following this plan? Reading food labels  Check the calorie count compared to the serving size. The serving size may be smaller than what you are used to eating.  Check the source of the calories. Make sure the food you are eating is high in vitamins and protein and low in saturated and trans fats. Shopping  Read nutrition labels while you shop. This will help you make healthy decisions before you decide to purchase your food.  Make a grocery list and stick to it. Cooking  Try to cook your favorite foods in a healthier way. For example, try baking instead of frying.  Use low-fat dairy products. Meal planning  Use more fruits and vegetables. Half of your plate should be fruits and vegetables.  Include lean proteins like poultry and fish.  How do I count calories when eating out?  Ask for smaller portion sizes.  Consider sharing an entree and sides instead of getting your own entree.  If you get your own entree, eat only half. Ask for a box at the beginning of your meal and put the rest of your entree in it so you are not tempted to eat it.  If calories are listed on the menu, choose the lower calorie options.  Choose dishes that include vegetables, fruits, whole grains, low-fat dairy products, and lean protein.  Choose items that are boiled, broiled, grilled, or steamed. Stay away from items that are buttered, battered, fried, or served with cream sauce. Items labeled "crispy" are usually fried, unless stated otherwise.  Choose water, low-fat milk, unsweetened iced tea, or other drinks without added sugar. If you want an alcoholic beverage, choose a lower calorie option such as a glass of wine or light beer.  Ask for dressings, sauces, and syrups on the side. These are usually high in calories, so you should limit the amount you eat.  If you want a salad, choose a garden salad and ask for grilled meats. Avoid extra toppings like bacon, cheese, or fried items. Ask for the dressing on the side, or ask for olive oil and vinegar or lemon to use as dressing.  Estimate how many servings of a food you are given. For example, a serving of cooked rice is  cup or about the size of half a baseball. Knowing serving sizes will help you be aware of how much food you are eating at restaurants. The list below tells you how big or small some common portion sizes are based on everyday objects: ? 1 oz-4 stacked dice. ? 3 oz-1 deck of cards. ? 1 tsp-1 die. ? 1 Tbsp- a ping-pong ball. ? 2  Tbsp-1 ping-pong ball. ?  cup- baseball. ? 1 cup-1 baseball. Summary  Calorie counting means keeping track of how many calories you eat and drink each day. If you eat fewer calories than your body needs, you should lose weight.  A healthy amount of  weight to lose per week is usually 1-2 lb (0.5-0.9 kg). This usually means reducing your daily calorie intake by 500-750 calories.  The number of calories in a food can be found on a Nutrition Facts label. If a food does not have a Nutrition Facts label, try to look up the calories online or ask your dietitian for help.  Use your calories on foods and drinks that will fill you up, and not on foods and drinks that will leave you hungry.  Use smaller plates, glasses, and bowls to prevent overeating. This information is not intended to replace advice given to you by your health care provider. Make sure you discuss any questions you have with your health care provider. Document Revised: 03/28/2018 Document Reviewed: 06/08/2016 Elsevier Patient Education  [place].

## 2020-03-17 NOTE — Assessment & Plan Note (Signed)
Patient was admitted to [facility] for acute gangrenous cholecystitis. Patient is reporting no pain today, slight queasiness, patient has Zofran to control nausea. Provided education to patient to use medication as directed. Overall patient is reporting well-managed symptoms. Laparoscopic surgical site is without signs and symptoms of infection.

## 2020-03-21 ENCOUNTER — Telehealth: Payer: Self-pay | Admitting: Pharmacist

## 2020-03-21 NOTE — Telephone Encounter (Signed)
Left VM for patient to return call PCP wanted me to reach out to see if we can help with the cost of eliquis

## 2020-03-22 ENCOUNTER — Ambulatory Visit (INDEPENDENT_AMBULATORY_CARE_PROVIDER_SITE_OTHER): Payer: Medicare Other | Admitting: Licensed Clinical Social Worker

## 2020-03-22 DIAGNOSIS — I7121 Aneurysm of the ascending aorta, without rupture: Secondary | ICD-10-CM

## 2020-03-22 DIAGNOSIS — E1169 Type 2 diabetes mellitus with other specified complication: Secondary | ICD-10-CM | POA: Diagnosis not present

## 2020-03-22 DIAGNOSIS — E782 Mixed hyperlipidemia: Secondary | ICD-10-CM

## 2020-03-22 DIAGNOSIS — I712 Thoracic aortic aneurysm, without rupture: Secondary | ICD-10-CM

## 2020-03-22 DIAGNOSIS — I10 Essential (primary) hypertension: Secondary | ICD-10-CM
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
## 2020-03-23 NOTE — Telephone Encounter (Signed)
Pt rc for [employer]

## 2020-03-29 ENCOUNTER — Telehealth: Payer: Self-pay | Admitting: Internal Medicine

## 2020-03-29 DIAGNOSIS — Z79899 Other long term (current) drug therapy: Secondary | ICD-10-CM

## 2020-03-29 DIAGNOSIS — I482 Chronic atrial fibrillation, unspecified: Secondary | ICD-10-CM

## 2020-03-29 NOTE — Telephone Encounter (Signed)
Pt seen in Aug while in [place] for atrial fibrillation I am not convinced weakness is side effect of eliquis Needs to check CBC and BMET as well as BNP Keep on Eliquis

## 2020-03-29 NOTE — Telephone Encounter (Signed)
New message    Pt c/o medication issue:  1. Name of Medication: eliquis   2. How are you currently taking this medication (dosage and times per day)?  As prescribed  3. Are you having a reaction (difficulty breathing--STAT)?  yes  4. What is your medication issue?  Patient states that he is having weakness, sob , and dizziness when taking this medication

## 2020-03-29 NOTE — Telephone Encounter (Signed)
Pt c/o being week and SOB for about a week. Denies chest pain at this time. Had Cholestectomy done on 03/02/20. No other complaints at this time. Feels that this a side effect of the Eliquis he has started taking. Please advise.

## 2020-03-30 ENCOUNTER — Telehealth: Payer: Self-pay | Admitting: Internal Medicine

## 2020-03-30 ENCOUNTER — Other Ambulatory Visit (HOSPITAL_COMMUNITY)
Admission: RE | Admit: 2020-03-30 | Discharge: 2020-03-30 | Disposition: A | Discharge status: Home / Self Care | Payer: Medicare Other | Source: Ambulatory Visit | Attending: Internal Medicine | Admitting: Internal Medicine

## 2020-03-30 DIAGNOSIS — Z79899 Other long term (current) drug therapy: Secondary | ICD-10-CM | POA: Insufficient documentation

## 2020-03-30 LAB — COMPREHENSIVE METABOLIC PANEL
ALT: 14 U/L (ref 0–44)
AST: 12 U/L — ABNORMAL LOW (ref 15–41)
Albumin: 3.5 g/dL (ref 3.5–5.0)
Alkaline Phosphatase: 57 U/L (ref 38–126)
Anion gap: 10 (ref 5–15)
BUN: 21 mg/dL (ref 8–23)
CO2: 24 mmol/L (ref 22–32)
Calcium: 9.3 mg/dL (ref 8.9–10.3)
Chloride: 104 mmol/L (ref 98–111)
Creatinine, Ser: 1.13 mg/dL (ref 0.61–1.24)
GFR calc Af Amer: >60 mL/min (ref >60)
GFR calc non Af Amer: >60 mL/min (ref >60)
Glucose, Bld: 101 mg/dL — ABNORMAL HIGH (ref 70–99)
Potassium: 4.5 mmol/L (ref 3.5–5.1)
Sodium: 138 mmol/L (ref 135–145)
Total Bilirubin: 0.8 mg/dL (ref 0.3–1.2)
Total Protein: 6.4 g/dL — ABNORMAL LOW (ref 6.5–8.1)

## 2020-03-30 LAB — CBC
HCT: 39.8 % (ref 39.0–52.0)
Hemoglobin: 12.6 g/dL — ABNORMAL LOW (ref 13.0–17.0)
MCH: 29 pg (ref 26.0–34.0)
MCHC: 31.7 g/dL (ref 30.0–36.0)
MCV: 91.7 fL (ref 80.0–100.0)
Platelets: 268 10*3/uL (ref 150–400)
RBC: 4.34 MIL/uL (ref 4.22–5.81)
RDW: 13.2 % (ref 11.5–15.5)
WBC: 9.1 10*3/uL (ref 4.0–10.5)
nRBC: 0 % (ref 0.0–0.2)

## 2020-03-30 LAB — BRAIN NATRIURETIC PEPTIDE: B Natriuretic Peptide: 158 pg/mL — ABNORMAL HIGH (ref 0.0–100.0)

## 2020-03-30 NOTE — Telephone Encounter (Signed)
Called patient to review labs   He had called complaining of DOE   Labs overall look OK   BNP is minimally elevated  He gives out easily May be afib    I last saw him (only time) when he was hosp at [facility] for gallbladder Also says he cannot afford Eliquis ($300/month)  Pt has appt on Monday  Will see if this can be advanced   Otherwise keep appt WIll need to discuss cardioersion vs possible TEE / cardioversion Need to review/decide what anticoag he can affored

## 2020-03-30 NOTE — Addendum Note (Signed)
Addended by: Barbarann Ehlers A on: 03/30/2020 08:29 AM   Modules accepted: Orders

## 2020-03-30 NOTE — Telephone Encounter (Signed)
I spoke with patient. He will stop by to pick up lab slip this am. He will continue Eliquis

## 2020-04-04 ENCOUNTER — Ambulatory Visit (INDEPENDENT_AMBULATORY_CARE_PROVIDER_SITE_OTHER): Payer: Medicare Other | Admitting: Family Medicine

## 2020-04-04 ENCOUNTER — Encounter: Payer: Self-pay | Admitting: Family Medicine

## 2020-04-04 ENCOUNTER — Other Ambulatory Visit: Payer: Self-pay

## 2020-04-04 VITALS — BP 122/72 | HR 102 | Ht 68.0 in | Wt 306.0 lb

## 2020-04-04 DIAGNOSIS — I4891 Unspecified atrial fibrillation: Secondary | ICD-10-CM | POA: Diagnosis not present

## 2020-04-04 DIAGNOSIS — I10 Essential (primary) hypertension: Secondary | ICD-10-CM | POA: Diagnosis not present

## 2020-04-04 MED ORDER — APIXABAN 5 MG PO TABS: 5.0000 mg | ORAL_TABLET | Freq: Two times a day (BID) | ORAL | 0 refills | Status: DC

## 2020-04-04 MED ORDER — METOPROLOL TARTRATE 25 MG PO TABS: 25.0000 mg | ORAL_TABLET | Freq: Two times a day (BID) | ORAL | 6 refills | Status: DC

## 2020-04-04 NOTE — Telephone Encounter (Signed)
Hey Dr. Harrington Challenger.  Patient wants to go ahead and have the cardioversion.  He also wants to keep you as his primary cardiologist.  Just want to make sure I have the okay from you to schedule DCCV for patient.  We are giving him samples of Eliquis.  It appears he has been compliant with his Eliquis since discharge from the hospital on August 12.  Therefore I assume he can have the cardioversion without TEE.  I told him your husband could probably do it fairly soon if that is okay with you.  If so I will have staff schedule with your husband to have DC cardioversion.  He is applying for patient assistance for the Eliquis.  Hopefully he can continue with that if it is affordable.  I did discuss the risk and benefits of the cardioversion with him.  I informed him that if he did not receive patient assistance the other option would be starting Coumadin after the cardioversion.  Just wanted to make sure all of this was okay with you and we will go ahead and get him on the schedule with your husband to be cardioverted.  Thank you

## 2020-04-04 NOTE — Progress Notes (Signed)
Cardiology Office Note  Date: 04/04/2020   ID: Ryan Garner, DOB 11-Aug-1945, MRN [id number]  PCP:  [name], [name], MD  Cardiologist:  Dorris Carnes, MD Electrophysiologist:  None   Chief Complaint: Status post hospital admission and lap cholecystectomy, new onset atrial fibrillation.  History of Present Illness: Ryan Garner is a 74 y.o. male with a history of recent new onset atrial fibrillation, hyperlipidemia, hypertension, morbid obesity, AKI, hypokalemia, hyponatremia, prediabetes.   Recent admission on 02/28/2020 for abdominal pain.  Diagnosis acute cholecystitis.  He underwent a laparoscopic cholecystectomy on 03/02/2020 by Dr. Arnoldo Morale.  He was noted to be in new onset atrial fibrillation with RVR.  This was an incidental finding since the patient did not have symptoms.  He was noted to be tachycardic which improved with metoprolol.  CHA2DS2-VASc score of at least 4.  He was started on a heparin drip which was held for surgery.  Cardiology was consulted and recommended follow-up in atrial fibrillation clinic.  Was placed on Eliquis before discharge.  He had acute kidney injury during hospitalization likely related to volume depletion.  He was given IV fluid hydration and creatinine improved from 2.1-0.96.  He was hypokalemic and was repleted with potassium.  Potassium at discharge was 3.8.  He was hyponatremic secondary to GI losses and thiazide diuretics.  Sodium improved to 137 prior to discharge.  HCTZ and ACE inhibitor were held.  His blood pressure was stable and he was placed on low-dose metoprolol.  HCTZ/benazepril were discontinued.  His hemoglobin A1c was 6.  His BMI was 45.  Patient states he is doing relatively well but does feel some shortness of breath when exerting.  He was slightly tachycardic when walking in an initial vital signs were taken.  EKG after sitting showed atrial fibrillation with premature ventricular or aberrantly conducted complexes.  Heart rate was 80.  Other  than the dyspnea on exertion he denies any other anginal or exertional symptoms.  States he can occasionally feel the palpitations.  He denies any lightheadedness, dizziness, presyncope or syncopal episodes.  No other orthostatic symptoms.  Denies any PND or orthopnea.  No CVA or TIA-like symptoms.  He states he has been compliant with his Eliquis therapy since discharge on March 03, 2020 from the hospital after lap chole and discovery of new onset atrial fibrillation.  He denies any bleeding, claudication-like symptoms, DVT or PE-like symptoms.  States he has occasional lower extremity edema.  Past Medical History:  Diagnosis Date  . Hyperlipidemia   . Hypertension   . Metabolic syndrome   . Metabolic syndrome   . Prediabetes     Past Surgical History:  Procedure Laterality Date  . APPENDECTOMY    . CHOLECYSTECTOMY N/A 03/02/2020   Procedure: LAPAROSCOPIC CHOLECYSTECTOMY;  Surgeon: Aviva Signs, MD;  Location: [facility];  Service: General;  Laterality: N/A;  . Eyelid Surgery Bilateral   . HERNIA REPAIR    . knee tendons repair    . ROTATOR CUFF REPAIR Bilateral   . TONSILLECTOMY      Current Outpatient Medications  Medication Sig Dispense Refill  . apixaban (ELIQUIS) 5 MG TABS tablet Take 1 tablet (5 mg total) by mouth 2 (two) times daily. 28 tablet 0  . fluticasone (FLONASE) 50 MCG/ACT nasal spray Place 2 sprays into both nostrils daily. (Patient taking differently: Place 2 sprays into both nostrils daily as needed for allergies. ) 16 g 6  . metoprolol tartrate (LOPRESSOR) 25 MG tablet Take 1 tablet (25 mg  total) by mouth 2 (two) times daily. 60 tablet 6  . Multiple Vitamin (MULTIVITAMIN WITH MINERALS) TABS tablet Take 1 tablet by mouth daily.     No current facility-administered medications for this visit.   Allergies:  Rofecoxib   Social History: The patient  reports that he quit smoking about 61 years ago. His smoking use included cigarettes. He has never used smokeless tobacco.  He reports that he does not drink alcohol and does not use drugs.   Family History: The patient's family history includes Arthritis in his father; Cancer in his mother; Coronary artery disease in his mother; Diabetes in his brother and maternal grandmother; Heart attack in his brother.   ROS:  Please see the history of present illness. Otherwise, complete review of systems is positive for none.  All other systems are reviewed and negative.   Physical Exam: VS:  BP 122/72   Pulse (!) 102   Ht 5\' 8"  (1.727 m)   Wt (!) 306 lb (138.8 kg)   SpO2 96%   BMI 46.53 kg/m , BMI Body mass index is 46.53 kg/m.  Wt Readings from Last 3 Encounters:  04/04/20 (!) 306 lb (138.8 kg)  03/17/20 299 lb 3.2 oz (135.7 kg)  03/15/20 299 lb (135.6 kg)    General: Patient appears comfortable at rest. Neck: Supple, no elevated JVP or carotid bruits, no thyromegaly. Lungs: Clear to auscultation, nonlabored breathing at rest. Cardiac: Irregularly irregular rate and rhythm, no S3 or significant systolic murmur, no pericardial rub. Extremities: No pitting edema, distal pulses 2+. Skin: Warm and dry. Musculoskeletal: No kyphosis. Neuropsychiatric: Alert and oriented x3, affect grossly appropriate.   ECG:  An ECG dated 04/04/2020 was personally reviewed today and demonstrated:  Atrial fibrillation with premature ventricular or aberrantly conducted complexes rate of 80.  Recent Labwork: 02/28/2020: TSH 0.599 03/01/2020: Magnesium 2.1 03/30/2020: ALT 14; AST 12; B Natriuretic Peptide 158.0; BUN 21; Creatinine, Ser 1.13; Hemoglobin 12.6; Platelets 268; Potassium 4.5; Sodium 138     Component Value Date/Time   CHOL 166 09/04/2018 1156   TRIG 82 09/04/2018 1156   TRIG 74 08/27/2014 0826   HDL 42 09/04/2018 1156   HDL 56 08/27/2014 0826   CHOLHDL 4.0 09/04/2018 1156   LDLCALC 108 (H) 09/04/2018 1156   LDLCALC 78 11/13/2013 0911    Other Studies Reviewed Today:  Echocardiogram 02/29/2020  Poor acoustic windows  limit study Would recomm limited echo and Definity to further define LVEF and wall motion. . Left ventricular ejection fraction, by estimation, is indeterminate.. There is mild left ventricular hypertrophy. Left ventricular diastolic parameters are indeterminate. 2. Right ventricular systolic function is normal. The right ventricular size is mildly enlarged. There is moderately elevated pulmonary artery systolic pressure. 3. Left atrial size was severely dilated. 4. Right atrial size was mildly dilated. 5. The mitral valve is normal in structure. Mild mitral valve regurgitation. 6. AV is thickened, calcified with mildly restricted motion Peak and mean gradients through the valve are approximately 27 and 16 mm Hg respectively consistent with mild AS. Marland Kitchen The aortic valve is tricuspid. Aortic valve regurgitation is not visualized. 7. The inferior vena cava is normal in size with greater than 50% respiratory variability, suggesting right atrial pressure of 3 mmHg. Left Ventricle: Poor acoustic windows limit study Would recomm limited echo and Definity to further define LVEF and wall motion. Left ventricular ejection fraction, by estimation, is indeterminate%. The left ventricle has not fully assessed function. Left ventricular endocardial border not optimally defined  to evaluate regional wall motion. The left ventricular internal cavity size was normal in size. There is mild left ventricular hypertrophy. Left ventricular diastolic parameters are indeterminate. Right Ventricle: The right ventricular size is mildly enlarged. Right vetricular wall thickness was not assessed. Right ventricular systolic function is normal. There is moderately elevated pulmonary artery systolic pressure. The tricuspid regurgitant velocity is 3.45 m/s, and with an assumed right atrial pressure of 10 mmHg, the estimated right ventricular systolic pressure is 69.4 mmHg.  Patient Name: Ryan Garner Date of Exam:  02/29/2020 Medical Rec #: [id number] Height: 68.0 in Accession #: [id number] Weight: 300.0 lb Date of Birth: Aug 08, 1945 BSA: 2.428 m Patient Age: 72 years BP: 114/69 mmHg Patient Gender: M HR: 90 bpm. Exam Location: [facility] Procedure: 2D Echo Indications: Atrial Fibrillation 427.31 / I48.91 History: Patient has no prior history of Echocardiogram examinations. Arrythmias:Atrial Fibrillation; Risk Factors:Former Smoker, Diabetes, Dyslipidemia and Hypertension. Ascending Aortic Aneurysm. Sonographer: Leavy Cella RDCS (AE) Referring Phys: [phone] [place] Final  Left Atrium: Left atrial size was severely dilated. Right Atrium: Right atrial size was mildly dilated. Pericardium: There is no evidence of pericardial effusion. Mitral Valve: The mitral valve is normal in structure. Mild mitral valve regurgitation. Tricuspid Valve: The tricuspid valve is normal in structure. Tricuspid valve regurgitation is mild. Aortic Valve: AV is thickened, calcified with mildly restricted motion Peak and mean gradients through the valve are approximately 27 and 16 mm Hg respectively consistent with mild AS. The aortic valve is tricuspid. Aortic valve regurgitation is not visualized. Pulmonic Valve: The pulmonic valve was not well visualized. Pulmonic valve regurgitation is not visualized. Aorta: The aortic root is normal in size and structure. Venous: The inferior vena cava is normal in size with greater than 50% respiratory variability, suggesting right atrial pressure of 3 mmHg. IAS/Shunts: The interatrial septum was not assessed.   Echocardiogram 03/01/2020  1. Limited echo for EF. Normal LV size Mild LVH normal EF 65% with no RWMAls Definity used for endocardial border definition. Left Ventricle: Definity contrast agent was given IV to delineate the left ventricular endocardial borders. Additional Comments: Limited echo for EF. Normal LV size Mild LVH normal EF 65%  with no RWMAls Definity used for endocardial border definition. Patient Name: Ryan Garner Date of Exam: 03/01/2020 Medical Rec #: [id number] Height: 66.5 in Accession #: [id number] Weight: 302.2 lb Date of Birth: 11-22-1945 BSA: 2.397 m Patient Age: 67 years BP: 110/51 mmHg Patient Gender: M HR: 89 bpm. Exam Location: [facility] Procedure: Limited Echo STAT ECHO Indications: Preoperative evaluation History: Patient has prior history of Echocardiogram examinations, most recent 02/29/2020. Arrythmias:Atrial Fibrillation; Risk Factors:Hypertension, Diabetes and Dyslipidemia. Morbid obesity. Sonographer: Alvino Chapel RCS Referring Phys: [place]:  1. New onset atrial fibrillation ([place])   2. Essential hypertension    1. New onset atrial fibrillation [facility]) Recent new onset atrial fib discovered during recent hospital stay for laparoscopic cholecystectomy.  EKG today shows atrial fibrillation with premature ventricular or aberrantly conducted complexes rate of 80.  Patient states he is having some shortness of breath with and without exertional activity.  Initial heart rate on arrival today was 102.  Will increase metoprolol to 25 mg p.o. twice daily.  Continue Eliquis 5 mg p.o. twice daily.  We will schedule patient for DCCV with Dr. Lovena Le once we get the go ahead from Dr. Harrington Challenger.  Patient has applied for patient assistance for Eliquis due to prohibitive cost.  2.  Essential hypertension Blood pressure well controlled today.  Continue metoprolol 25 mg p.o. twice daily.  We have increased the dose due to patient's discomfort due to rapid heart rate during activity.    Medication Adjustments/Labs and Tests Ordered: Current medicines are reviewed at length with the patient today.  Concerns regarding medicines are outlined above.   Disposition: Follow-up with Dr. Harrington Challenger or APP in [place] in 2 months after DCCV.  Signed, Levell July, NP 04/04/2020 4:58 PM     [facility] at [place], [place], [place] [address] Phone: [phone]; Fax: [phone]

## 2020-04-04 NOTE — Telephone Encounter (Signed)
That sounds good    Agree with plan for cardioversion

## 2020-04-04 NOTE — Patient Instructions (Addendum)
Medication Instructions:   Increase Lopressor to 25mg  twice a day.  Continue all other medications.    Labwork: none  Testing/Procedures: none  Follow-Up: Dr. Harrington Challenger   Any Other Special Instructions Will Be Listed Below (If Applicable). You have been referred to:  Dr. Lovena Le - EP - [place] office.   If you need a refill on your cardiac medications before your next appointment, please call your pharmacy.

## 2020-04-14 ENCOUNTER — Ambulatory Visit (INDEPENDENT_AMBULATORY_CARE_PROVIDER_SITE_OTHER): Payer: Medicare Other | Admitting: Family Medicine

## 2020-04-14 ENCOUNTER — Other Ambulatory Visit: Payer: Self-pay

## 2020-04-14 ENCOUNTER — Encounter: Payer: Self-pay | Admitting: Family Medicine

## 2020-04-14 VITALS — BP 131/76 | HR 100 | Temp 97.3°F | Ht 68.0 in | Wt 309.0 lb

## 2020-04-14 DIAGNOSIS — B372 Candidiasis of skin and nail: Secondary | ICD-10-CM | POA: Diagnosis not present

## 2020-04-14 DIAGNOSIS — I4891 Unspecified atrial fibrillation: Secondary | ICD-10-CM

## 2020-04-14 DIAGNOSIS — R5383 Other fatigue: Secondary | ICD-10-CM

## 2020-04-14 DIAGNOSIS — R6889 Other general symptoms and signs: Secondary | ICD-10-CM | POA: Diagnosis not present

## 2020-04-14 MED ORDER — NYSTATIN 100000 UNIT/GM EX CREA: 1.0000 "application " | TOPICAL_CREAM | Freq: Two times a day (BID) | CUTANEOUS | 0 refills | Status: DC

## 2020-04-14 MED ORDER — NYSTATIN 100000 UNIT/GM EX POWD: 1.0000 "application " | Freq: Three times a day (TID) | CUTANEOUS | 0 refills | Status: DC

## 2020-04-14 MED ORDER — METOPROLOL TARTRATE 25 MG PO TABS: 37.5000 mg | ORAL_TABLET | Freq: Two times a day (BID) | ORAL | 6 refills | Status: DC

## 2020-04-14 NOTE — Progress Notes (Signed)
BP 131/76   Pulse 100   Temp (!) 97.3 F (36.3 C)   Ht 5' 8" (1.727 m)   Wt (!) 309 lb (140.2 kg)   SpO2 97%   BMI 46.98 kg/m    Subjective:   Patient ID: Ryan Garner, male    DOB: Mar 19, 1946, 74 y.o.   MRN: [id number]  HPI: Ryan Garner is a 74 y.o. male presenting on 04/14/2020 for Fatigue, Shortness of Breath, and yeast in groin (present x1 week)   HPI Patient is coming in complaining of fatigue and chest tightness and shortness of breath that has been going on over the past month since he left the hospital with acute gangrenous cholecystitis and new A. fib.  He does have scheduled for a cardioversion later this month.  Is also complaining his groin is been going on over the past week.  Patient just says he is short of breath on exertion.  Relevant past medical, surgical, family and social history reviewed and updated as indicated. Interim medical history since our last visit reviewed. Allergies and medications reviewed and updated.  Review of Systems  Constitutional: Positive for fatigue. Negative for chills and fever.  Respiratory: Negative for shortness of breath and wheezing.   Cardiovascular: Negative for chest pain and leg swelling.  Musculoskeletal: Negative for back pain and gait problem.  Skin: Negative for rash.  Neurological: Negative for dizziness, weakness and light-headedness.  All other systems reviewed and are negative.   Per HPI unless specifically indicated above   Allergies as of 04/14/2020      Reactions   Rofecoxib Other (See Comments)   (Vioxx)      Medication List       Accurate as of April 14, 2020  3:05 PM. If you have any questions, ask your nurse or doctor.        apixaban 5 MG Tabs tablet Commonly known as: ELIQUIS Take 1 tablet (5 mg total) by mouth 2 (two) times daily.   fluticasone 50 MCG/ACT nasal spray Commonly known as: FLONASE Place 2 sprays into both nostrils daily. What changed:   when to take this  reasons to  take this   metoprolol tartrate 25 MG tablet Commonly known as: LOPRESSOR Take 1.5 tablets (37.5 mg total) by mouth 2 (two) times daily. What changed: how much to take Changed by: Worthy Rancher, MD   multivitamin with minerals Tabs tablet Take 1 tablet by mouth daily.   nystatin powder Commonly known as: MYCOSTATIN/NYSTOP Apply 1 application topically 3 (three) times daily. Started by: [name] [name], MD   nystatin cream Commonly known as: MYCOSTATIN Apply 1 application topically 2 (two) times daily. Started by: Worthy Rancher, MD   Prostate Support 300-15 MG Tabs Take by mouth daily. Super Beta Prostate        Objective:   BP 131/76   Pulse 100   Temp (!) 97.3 F (36.3 C)   Ht 5' 8" (1.727 m)   Wt (!) 309 lb (140.2 kg)   SpO2 97%   BMI 46.98 kg/m   Wt Readings from Last 3 Encounters:  04/14/20 (!) 309 lb (140.2 kg)  04/04/20 (!) 306 lb (138.8 kg)  03/17/20 299 lb 3.2 oz (135.7 kg)    Physical Exam Vitals and nursing note reviewed.  Constitutional:      General: He is not in acute distress.    Appearance: He is well-developed. He is not diaphoretic.  Eyes:     General: No scleral  icterus.    Conjunctiva/sclera: Conjunctivae normal.  Neck:     Thyroid: No thyromegaly.  Cardiovascular:     Rate and Rhythm: Normal rate. Rhythm irregular.     Heart sounds: Normal heart sounds. No murmur heard.   Pulmonary:     Effort: Pulmonary effort is normal. No respiratory distress.     Breath sounds: Normal breath sounds. No wheezing.  Musculoskeletal:        General: No swelling. Normal range of motion.     Cervical back: Neck supple.  Lymphadenopathy:     Cervical: No cervical adenopathy.  Skin:    General: Skin is warm and dry.     Findings: No rash.  Neurological:     Mental Status: He is alert and oriented to person, place, and time.     Coordination: Coordination normal.  Psychiatric:        Behavior: Behavior normal.       Assessment  & Plan:   Problem List Items Addressed This Visit      Cardiovascular and Mediastinum   New onset atrial fibrillation (HCC)   Relevant Medications   metoprolol tartrate (LOPRESSOR) 25 MG tablet    Other Visit Diagnoses    Other fatigue    -  Primary   Relevant Orders   CBC with Differential/Platelet   CMP14+EGFR   Thyroid Panel With TSH   Vitamin B12   VITAMIN D 25 Hydroxy (Vit-D Deficiency, Fractures)   Yeast dermatitis       Relevant Medications   nystatin (MYCOSTATIN/NYSTOP) powder   nystatin cream (MYCOSTATIN)      Increase metoprolol to 1-1/2 tablets twice daily, will set up with [facility] for prescription assistance for the Eliquis.  We will treat with nystatin for yeast Derm. Follow up plan: Return if symptoms worsen or fail to improve.  Counseling provided for all of the vaccine components Orders Placed This Encounter  Procedures  . CBC with Differential/Platelet  . CMP14+EGFR  . Thyroid Panel With TSH  . Vitamin B12  . VITAMIN D 25 Hydroxy (Vit-D Deficiency, Fractures)    Caryl Pina, MD [facility] 04/14/2020, 3:05 PM

## 2020-04-15 LAB — CMP14+EGFR
ALT: 19 IU/L (ref 0–44)
AST: 12 IU/L (ref 0–40)
Albumin/Globulin Ratio: 1.9 (ref 1.2–2.2)
Albumin: 4 g/dL (ref 3.7–4.7)
Alkaline Phosphatase: 76 IU/L (ref 44–121)
BUN/Creatinine Ratio: 23 (ref 10–24)
BUN: 20 mg/dL (ref 8–27)
Bilirubin Total: 0.3 mg/dL (ref 0.0–1.2)
CO2: 22 mmol/L (ref 20–29)
Calcium: 9.7 mg/dL (ref 8.6–10.2)
Chloride: 103 mmol/L (ref 96–106)
Creatinine, Ser: 0.88 mg/dL (ref 0.76–1.27)
GFR calc Af Amer: 96 mL/min/{1.73_m2} (ref >59)
GFR calc non Af Amer: 83 mL/min/{1.73_m2} (ref >59)
Globulin, Total: 2.1 g/dL (ref 1.5–4.5)
Glucose: 115 mg/dL — ABNORMAL HIGH (ref 65–99)
Potassium: 4.5 mmol/L (ref 3.5–5.2)
Sodium: 138 mmol/L (ref 134–144)
Total Protein: 6.1 g/dL (ref 6.0–8.5)

## 2020-04-15 LAB — CBC WITH DIFFERENTIAL/PLATELET
Basophils Absolute: 0 10*3/uL (ref 0.0–0.2)
Basos: 0 %
EOS (ABSOLUTE): 0.1 10*3/uL (ref 0.0–0.4)
Eos: 1 %
Hematocrit: 38.7 % (ref 37.5–51.0)
Hemoglobin: 13.2 g/dL (ref 13.0–17.7)
Immature Grans (Abs): 0 10*3/uL (ref 0.0–0.1)
Immature Granulocytes: 0 %
Lymphocytes Absolute: 2.2 10*3/uL (ref 0.7–3.1)
Lymphs: 21 %
MCH: 29.7 pg (ref 26.6–33.0)
MCHC: 34.1 g/dL (ref 31.5–35.7)
MCV: 87 fL (ref 79–97)
Monocytes Absolute: 0.9 10*3/uL (ref 0.1–0.9)
Monocytes: 9 %
Neutrophils Absolute: 7 10*3/uL (ref 1.4–7.0)
Neutrophils: 69 %
Platelets: 271 10*3/uL (ref 150–450)
RBC: 4.45 x10E6/uL (ref 4.14–5.80)
RDW: 12.8 % (ref 11.6–15.4)
WBC: 10.1 10*3/uL (ref 3.4–10.8)

## 2020-04-15 LAB — THYROID PANEL WITH TSH
Free Thyroxine Index: 1.8 (ref 1.2–4.9)
T3 Uptake Ratio: 28 % (ref 24–39)
T4, Total: 6.6 ug/dL (ref 4.5–12.0)
TSH: 1.22 u[IU]/mL (ref 0.450–4.500)

## 2020-04-15 LAB — VITAMIN D 25 HYDROXY (VIT D DEFICIENCY, FRACTURES): Vit D, 25-Hydroxy: 36.7 ng/mL (ref 30.0–100.0)

## 2020-04-15 LAB — VITAMIN B12: Vitamin B-12: 1568 pg/mL — ABNORMAL HIGH (ref 232–1245)

## 2020-04-18 ENCOUNTER — Ambulatory Visit: Payer: Medicare Other | Admitting: Nurse Practitioner

## 2020-04-18 ENCOUNTER — Telehealth: Payer: Self-pay | Admitting: Family Medicine

## 2020-04-18 NOTE — Telephone Encounter (Signed)
Pt says when he was in the hospital, the doctor there took him off lisinopril with the lasix and says now his legs are swelling. Needs to know what Dr [name] wants him to do.

## 2020-04-20 ENCOUNTER — Encounter: Payer: Self-pay | Admitting: *Deleted

## 2020-04-20 ENCOUNTER — Other Ambulatory Visit: Payer: Self-pay | Admitting: Family Medicine

## 2020-04-20 DIAGNOSIS — B372 Candidiasis of skin and nail: Secondary | ICD-10-CM

## 2020-04-20 MED ORDER — FUROSEMIDE 20 MG PO TABS: 20.0000 mg | ORAL_TABLET | Freq: Every day | ORAL | 1 refills | Status: DC | PRN

## 2020-04-20 NOTE — Telephone Encounter (Signed)
Have him go ahead and restart the Lasix and let me know how it goes and then have him come back in 1 week and recheck a basic metabolic panel

## 2020-04-20 NOTE — Progress Notes (Signed)
Fax notification received from [employer] that eliquis patient assistance is approved through the remainder of the calendar year.

## 2020-04-20 NOTE — Telephone Encounter (Signed)
Please let him know that I sent in some Lasix that he can use as needed for swelling but does not have to use it every day, do not use it more than once a day.,  Have him continue to watch his blood pressures but as long as they are running good and I would not restart the lisinopril yet. Caryl Pina, MD [facility] 04/20/2020, 12:43 PM

## 2020-04-20 NOTE — Telephone Encounter (Signed)
When I informed patient to restart the Lasix he reported that he was not on Lasix before, he was taking Lisinopril HCTZ 10/12.5.  Do you want him to restart this medication or do you want to send in a prescription for Lasix?  Also, he reports that he is extremely short of breath and is wondering why.  I explained to the patient that fluid accumulations could cause shortness of breath but he wanted to let you know and ask your opinion.

## 2020-04-20 NOTE — Telephone Encounter (Signed)
LMTCB

## 2020-04-21 ENCOUNTER — Ambulatory Visit (INDEPENDENT_AMBULATORY_CARE_PROVIDER_SITE_OTHER): Payer: Medicare Other | Admitting: Internal Medicine

## 2020-04-21 ENCOUNTER — Encounter: Payer: Self-pay | Admitting: Internal Medicine

## 2020-04-21 ENCOUNTER — Other Ambulatory Visit: Payer: Self-pay

## 2020-04-21 VITALS — BP 148/98 | HR 92 | Ht 68.0 in | Wt 310.2 lb

## 2020-04-21 DIAGNOSIS — I4891 Unspecified atrial fibrillation: Secondary | ICD-10-CM | POA: Diagnosis not present

## 2020-04-21 MED ORDER — FLECAINIDE ACETATE 100 MG PO TABS: ORAL_TABLET | ORAL | 3 refills | Status: DC

## 2020-04-21 NOTE — Patient Instructions (Signed)
Medication Instructions:  Your physician has recommended you make the following change in your medication:   Start Flecainide 1/2 Tablet Two Times Daily for one Week then increase to 1 Tablet Two times a day .     *If you need a refill on your cardiac medications before your next appointment, please call your pharmacy*   Lab Work: NONE   If you have labs (blood work) drawn today and your tests are completely normal, you will receive your results only by: Marland Kitchen MyChart Message (if you have MyChart) OR . A paper copy in the mail If you have any lab test that is abnormal or we need to change your treatment, we will call you to review the results.   Testing/Procedures: Your physician has recommended that you have a Cardioversion (DCCV). Electrical Cardioversion uses a jolt of electricity to your heart either through paddles or wired patches attached to your chest. This is a controlled, usually prescheduled, procedure. Defibrillation is done under light anesthesia in the hospital, and you usually go home the day of the procedure. This is done to get your heart back into a normal rhythm. You are not awake for the procedure. Please see the instruction sheet given to you today.  EKG in one week    Follow-Up: At [facility], you and your health needs are our priority.  As part of our continuing mission to provide you with exceptional heart care, we have created designated Provider Care Teams.  These Care Teams include your primary Cardiologist (physician) and Advanced Practice Providers (APPs -  Physician Assistants and Nurse Practitioners) who all work together to provide you with the care you need, when you need it.  We recommend signing up for the patient portal called "MyChart".  Sign up information is provided on this After Visit Summary.  MyChart is used to connect with patients for Virtual Visits (Telemedicine).  Patients are able to view lab/test results, encounter notes, upcoming  appointments, etc.  Non-urgent messages can be sent to your provider as well.   To learn more about what you can do with MyChart, go to [email or web address].    Your next appointment:    After Cardioversion   The format for your next appointment:   In Person  Provider:   Cristopher Peru, MD   Other Instructions Thank you for choosing [facility]!

## 2020-04-21 NOTE — Progress Notes (Signed)
HPI Ryan Garner is referred today by Levell July, NP for evaluation of atrial fib. He is a morbidly obese man with a h/o atrial fib which was discovered incidentally when he presented with a gangrenous gall bladder. He did not know that he was out of rhythm. He has had worsening SOB over the past few weeks. He denies chest pain. He has chronic lower extremity edema. His energy level is down. He denies fever, chills or GI difficulties since his gall bladder was removed. Allergies  Allergen Reactions  . Rofecoxib Other (See Comments)    (Vioxx)     Current Outpatient Medications  Medication Sig Dispense Refill  . apixaban (ELIQUIS) 5 MG TABS tablet Take 1 tablet (5 mg total) by mouth 2 (two) times daily. 28 tablet 0  . fluticasone (FLONASE) 50 MCG/ACT nasal spray Place 2 sprays into both nostrils daily. 16 g 6  . furosemide (LASIX) 20 MG tablet Take 1 tablet (20 mg total) by mouth daily as needed for fluid or edema. 30 tablet 1  . metoprolol tartrate (LOPRESSOR) 25 MG tablet Take 1.5 tablets (37.5 mg total) by mouth 2 (two) times daily. 90 tablet 6  . Misc Natural Products (PROSTATE SUPPORT) 300-15 MG TABS Take by mouth daily. Super Beta Prostate    . Multiple Vitamin (MULTIVITAMIN WITH MINERALS) TABS tablet Take 1 tablet by mouth daily.    Marland Kitchen nystatin (MYCOSTATIN/NYSTOP) powder Apply 1 application topically 3 (three) times daily. 15 g 0  . nystatin cream (MYCOSTATIN) Apply 1 application topically 2 (two) times daily. 30 g 0   No current facility-administered medications for this visit.     Past Medical History:  Diagnosis Date  . Hyperlipidemia   . Hypertension   . Metabolic syndrome   . Metabolic syndrome   . Prediabetes     ROS:   All systems reviewed and negative except as noted in the HPI.   Past Surgical History:  Procedure Laterality Date  . APPENDECTOMY    . CHOLECYSTECTOMY N/A 03/02/2020   Procedure: LAPAROSCOPIC CHOLECYSTECTOMY;  Surgeon: Aviva Signs, MD;   Location: [facility];  Service: General;  Laterality: N/A;  . Eyelid Surgery Bilateral   . HERNIA REPAIR    . knee tendons repair    . ROTATOR CUFF REPAIR Bilateral   . TONSILLECTOMY       Family History  Problem Relation Age of Onset  . Cancer Mother        lungs  . Coronary artery disease Mother   . Diabetes Brother   . Heart attack Brother   . Diabetes Maternal Grandmother   . Arthritis Father      Social History   Socioeconomic History  . Marital status: Married    Spouse name: [name]  . Number of children: 2  . Years of education: 31  . Highest education level: Doctorate  Occupational History  . 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 Smoking status: Former Smoker    Types: Cigarettes    Quit date: 04/02/1959    Years since quitting: 61.0  . Smokeless tobacco: Never Used  Vaping Use  . Vaping Use: Never used  Substance and Sexual Activity  . Alcohol use: No  . Drug use: No  . Sexual activity: Yes  Other Topics Concern  . Not on file  Social History Narrative  . Not on file  Social Determinants of Health   Financial Resource Strain:   . Difficulty of Paying Living Expenses: Not on file  Food Insecurity:   . Worried About Charity fundraiser in the Last Year: Not on file  . Ran Out of Food in the Last Year: Not on file  Transportation Needs:   . Lack of Transportation (Medical): Not on file  . Lack of Transportation (Non-Medical): Not on file  Physical Activity:   . Days of Exercise per Week: Not on file  . Minutes of Exercise per Session: Not on file  Stress: Stress Concern Present  . Feeling of Stress : To some extent  Social Connections:   . Frequency of Communication with Friends and Family: Not on file  . Frequency of Social Gatherings with Friends and Family: Not on file  . Attends Religious Services: Not on file  . Active Member of Clubs or Organizations: Not  on file  . Attends Archivist Meetings: Not on file  . Marital Status: Not on file  Intimate Partner Violence:   . Fear of Current or Ex-Partner: Not on file  . Emotionally Abused: Not on file  . Physically Abused: Not on file  . Sexually Abused: Not on file     BP (!) 148/98   Pulse 92   Ht 5\' 8"  (1.727 m)   Wt (!) 310 lb 3.2 oz (140.7 kg)   SpO2 93%   BMI 47.17 kg/m   Physical Exam:  Morbidly obese appearing man, NAD HEENT: Unremarkable Neck:  6 cm JVD, no thyromegally Lymphatics:  No adenopathy Back:  No CVA tenderness Lungs:  Clear with no wheezes HEART:  IRegular rate rhythm, no murmurs, no rubs, no clicks Abd:  soft, positive bowel sounds, no organomegally, no rebound, no guarding Ext:  2 plus pulses, 3+ edema, no cyanosis, no clubbing Skin:  No rashes no nodules Neuro:  CN II through XII intact, motor grossly intact  EKG - atrial fib with a controlled VR  Assess/Plan: 1. Persistent atrial fib - while his VR appears to be controlled and he does not have palpitations, he is more sob, probably from atrial fib. I have recommended he start flecainide and undergo DCCV. He is not a good candidate for rhythm control because of his morbid obesity. I explained this but because of his sob, we will try to get him back to NSR. 2. Sob - he has been placed on lasix. He will avoid salty food.  3. Morbid obesity - weight loss is encouraged  Ryan Garner

## 2020-04-22 ENCOUNTER — Ambulatory Visit: Payer: Medicare Other | Admitting: Pharmacist

## 2020-04-27 ENCOUNTER — Ambulatory Visit (INDEPENDENT_AMBULATORY_CARE_PROVIDER_SITE_OTHER): Payer: Medicare Other | Admitting: Licensed Clinical Social Worker

## 2020-04-27 ENCOUNTER — Telehealth: Payer: Medicare Other

## 2020-04-27 DIAGNOSIS — I10 Essential (primary) hypertension: Secondary | ICD-10-CM | POA: Diagnosis not present

## 2020-04-27 DIAGNOSIS — E782 Mixed hyperlipidemia: Secondary | ICD-10-CM

## 2020-04-27 DIAGNOSIS — E1169 Type 2 diabetes mellitus with other specified complication: Secondary | ICD-10-CM

## 2020-04-27 DIAGNOSIS — I7121 Aneurysm of the ascending aorta, without rupture: Secondary | ICD-10-CM

## 2020-04-27 DIAGNOSIS — I712 Thoracic aortic aneurysm, without rupture: Secondary | ICD-10-CM

## 2020-04-27 DIAGNOSIS — Z87828 Personal history of other (healed) physical injury and trauma: Secondary | ICD-10-CM
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
## 2020-04-28 ENCOUNTER — Telehealth: Payer: Self-pay

## 2020-04-28 ENCOUNTER — Other Ambulatory Visit: Payer: Self-pay

## 2020-04-28 ENCOUNTER — Ambulatory Visit (INDEPENDENT_AMBULATORY_CARE_PROVIDER_SITE_OTHER): Payer: Medicare Other

## 2020-04-28 VITALS — BP 136/84 | HR 84

## 2020-04-28 DIAGNOSIS — I482 Chronic atrial fibrillation, unspecified: Secondary | ICD-10-CM | POA: Diagnosis not present

## 2020-04-28 DIAGNOSIS — R600 Localized edema: Secondary | ICD-10-CM

## 2020-04-28 DIAGNOSIS — R0602 Shortness of breath: Secondary | ICD-10-CM

## 2020-04-28 NOTE — Telephone Encounter (Signed)
Pt states that he has been wearing his compression stockings and elevated his legs.  He will come in the morning for BMP. Orders placed.

## 2020-04-28 NOTE — Telephone Encounter (Signed)
Pt says that furosemide (LASIX) 20 MG tablet is not working. He still has swollen legs feet and hands. Please call back

## 2020-04-28 NOTE — Progress Notes (Signed)
Seen for nurse visit today for EKG.Has cardioversion in 2 weeks  Will scan and route to Dr.Taylor

## 2020-04-28 NOTE — Telephone Encounter (Signed)
Have him elevate his feet and wear compression stockings and have him come in and do a BMP or basic metabolic panel to test to make sure where his kidneys are not potassium and everything.

## 2020-04-29 ENCOUNTER — Other Ambulatory Visit: Payer: Medicare Other

## 2020-04-29 DIAGNOSIS — R600 Localized edema: Secondary | ICD-10-CM | POA: Diagnosis not present

## 2020-04-29 DIAGNOSIS — R0602 Shortness of breath: Secondary | ICD-10-CM | POA: Diagnosis not present

## 2020-04-30 ENCOUNTER — Encounter (HOSPITAL_COMMUNITY): Payer: Self-pay | Admitting: Cardiology

## 2020-04-30 ENCOUNTER — Inpatient Hospital Stay (HOSPITAL_COMMUNITY)
Admission: EM | Disposition: A | Discharge status: Home / Self Care | Payer: Self-pay | Source: Home / Self Care | Attending: Cardiology

## 2020-04-30 ENCOUNTER — Inpatient Hospital Stay (HOSPITAL_COMMUNITY)
Admission: EM | Admit: 2020-04-30 | Discharge: 2020-05-02 | DRG: 247 | Disposition: A | Discharge status: Home / Self Care | Payer: Medicare Other | Attending: Cardiology | Admitting: Cardiology

## 2020-04-30 ENCOUNTER — Other Ambulatory Visit: Payer: Self-pay

## 2020-04-30 DIAGNOSIS — I2510 Atherosclerotic heart disease of native coronary artery without angina pectoris: Secondary | ICD-10-CM | POA: Diagnosis not present

## 2020-04-30 DIAGNOSIS — I2121 ST elevation (STEMI) myocardial infarction involving left circumflex coronary artery: Secondary | ICD-10-CM | POA: Diagnosis not present

## 2020-04-30 DIAGNOSIS — Z20822 Contact with and (suspected) exposure to covid-19: Secondary | ICD-10-CM | POA: Diagnosis present

## 2020-04-30 DIAGNOSIS — Z87891 Personal history of nicotine dependence: Secondary | ICD-10-CM | POA: Diagnosis not present

## 2020-04-30 DIAGNOSIS — Z955 Presence of coronary angioplasty implant and graft: Secondary | ICD-10-CM

## 2020-04-30 DIAGNOSIS — Z7901 Long term (current) use of anticoagulants: Secondary | ICD-10-CM | POA: Diagnosis not present

## 2020-04-30 DIAGNOSIS — Z833 Family history of diabetes mellitus: Secondary | ICD-10-CM | POA: Diagnosis not present

## 2020-04-30 DIAGNOSIS — E785 Hyperlipidemia, unspecified: Secondary | ICD-10-CM | POA: Diagnosis present

## 2020-04-30 DIAGNOSIS — G4730 Sleep apnea, unspecified: Secondary | ICD-10-CM | POA: Diagnosis not present

## 2020-04-30 DIAGNOSIS — I340 Nonrheumatic mitral (valve) insufficiency: Secondary | ICD-10-CM | POA: Diagnosis not present

## 2020-04-30 DIAGNOSIS — Z79899 Other long term (current) drug therapy: Secondary | ICD-10-CM | POA: Diagnosis not present

## 2020-04-30 DIAGNOSIS — E8770 Fluid overload, unspecified: Secondary | ICD-10-CM | POA: Diagnosis present

## 2020-04-30 DIAGNOSIS — E119 Type 2 diabetes mellitus without complications: Secondary | ICD-10-CM

## 2020-04-30 DIAGNOSIS — I252 Old myocardial infarction: Secondary | ICD-10-CM | POA: Diagnosis present

## 2020-04-30 DIAGNOSIS — I4819 Other persistent atrial fibrillation: Secondary | ICD-10-CM | POA: Diagnosis present

## 2020-04-30 DIAGNOSIS — Z6841 Body Mass Index (BMI) 40.0 and over, adult: Secondary | ICD-10-CM

## 2020-04-30 DIAGNOSIS — Z888 Allergy status to other drugs, medicaments and biological substances status: Secondary | ICD-10-CM

## 2020-04-30 DIAGNOSIS — I351 Nonrheumatic aortic (valve) insufficiency: Secondary | ICD-10-CM | POA: Diagnosis not present

## 2020-04-30 DIAGNOSIS — I361 Nonrheumatic tricuspid (valve) insufficiency: Secondary | ICD-10-CM | POA: Diagnosis not present

## 2020-04-30 DIAGNOSIS — R0602 Shortness of breath: Secondary | ICD-10-CM | POA: Diagnosis not present

## 2020-04-30 DIAGNOSIS — Z8249 Family history of ischemic heart disease and other diseases of the circulatory system: Secondary | ICD-10-CM

## 2020-04-30 DIAGNOSIS — R079 Chest pain, unspecified: Secondary | ICD-10-CM | POA: Diagnosis not present

## 2020-04-30 DIAGNOSIS — I213 ST elevation (STEMI) myocardial infarction of unspecified site: Secondary | ICD-10-CM | POA: Diagnosis not present

## 2020-04-30 DIAGNOSIS — R0689 Other abnormalities of breathing: Secondary | ICD-10-CM | POA: Diagnosis not present

## 2020-04-30 DIAGNOSIS — E1169 Type 2 diabetes mellitus with other specified complication: Secondary | ICD-10-CM

## 2020-04-30 DIAGNOSIS — I4891 Unspecified atrial fibrillation: Secondary | ICD-10-CM | POA: Diagnosis present

## 2020-04-30 DIAGNOSIS — I10 Essential (primary) hypertension: Secondary | ICD-10-CM | POA: Diagnosis present

## 2020-04-30 DIAGNOSIS — R0789 Other chest pain: Secondary | ICD-10-CM | POA: Diagnosis not present

## 2020-04-30 HISTORY — DX: ST elevation (STEMI) myocardial infarction of unspecified site: I21.3

## 2020-04-30 HISTORY — PX: LEFT HEART CATH AND CORONARY ANGIOGRAPHY: CATH118249

## 2020-04-30 HISTORY — DX: Unspecified atrial fibrillation: I48.91

## 2020-04-30 HISTORY — PX: CORONARY/GRAFT ACUTE MI REVASCULARIZATION: CATH118305

## 2020-04-30 HISTORY — PX: CORONARY STENT INTERVENTION: CATH118234

## 2020-04-30 LAB — BMP8+EGFR
BUN/Creatinine Ratio: 20 (ref 10–24)
BUN: 25 mg/dL (ref 8–27)
CO2: 21 mmol/L (ref 20–29)
Calcium: 9.4 mg/dL (ref 8.6–10.2)
Chloride: 103 mmol/L (ref 96–106)
Creatinine, Ser: 1.24 mg/dL (ref 0.76–1.27)
GFR calc Af Amer: 65 mL/min/{1.73_m2} (ref >59)
GFR calc non Af Amer: 56 mL/min/{1.73_m2} — ABNORMAL LOW (ref >59)
Glucose: 149 mg/dL — ABNORMAL HIGH (ref 65–99)
Potassium: 4.1 mmol/L (ref 3.5–5.2)
Sodium: 139 mmol/L (ref 134–144)

## 2020-04-30 LAB — CBC
HCT: 40.8 % (ref 39.0–52.0)
HCT: 42 % (ref 39.0–52.0)
Hemoglobin: 13 g/dL (ref 13.0–17.0)
Hemoglobin: 13.3 g/dL (ref 13.0–17.0)
MCH: 28.6 pg (ref 26.0–34.0)
MCH: 28.8 pg (ref 26.0–34.0)
MCHC: 31.9 g/dL (ref 30.0–36.0)
MCHC: 31.7 g/dL (ref 30.0–36.0)
MCV: 89.7 fL (ref 80.0–100.0)
MCV: 90.9 fL (ref 80.0–100.0)
Platelets: 286 10*3/uL (ref 150–400)
Platelets: 261 10*3/uL (ref 150–400)
RBC: 4.55 MIL/uL (ref 4.22–5.81)
RBC: 4.62 MIL/uL (ref 4.22–5.81)
RDW: 13.9 % (ref 11.5–15.5)
RDW: 14 % (ref 11.5–15.5)
WBC: 11.1 10*3/uL — ABNORMAL HIGH (ref 4.0–10.5)
WBC: 10.5 10*3/uL (ref 4.0–10.5)
nRBC: 0 % (ref 0.0–0.2)
nRBC: 0 % (ref 0.0–0.2)

## 2020-04-30 LAB — MRSA PCR SCREENING: MRSA by PCR: POSITIVE — AB

## 2020-04-30 LAB — LIPID PANEL
Cholesterol: 123 mg/dL (ref 0–200)
HDL: 27 mg/dL — ABNORMAL LOW (ref >40)
LDL Cholesterol: 78 mg/dL (ref 0–99)
Total CHOL/HDL Ratio: 4.6 RATIO
Triglycerides: 88 mg/dL (ref <150)
VLDL: 18 mg/dL (ref 0–40)

## 2020-04-30 LAB — HEMOGLOBIN A1C
Hgb A1c MFr Bld: 6.2 % — ABNORMAL HIGH (ref 4.8–5.6)
Mean Plasma Glucose: 131.24 mg/dL

## 2020-04-30 LAB — APTT: aPTT: 34 seconds (ref 24–36)

## 2020-04-30 LAB — PROTIME-INR
INR: 1.3 — ABNORMAL HIGH (ref 0.8–1.2)
Prothrombin Time: 15.5 seconds — ABNORMAL HIGH (ref 11.4–15.2)

## 2020-04-30 LAB — COMPREHENSIVE METABOLIC PANEL
ALT: 31 U/L (ref 0–44)
AST: 19 U/L (ref 15–41)
Albumin: 3.3 g/dL — ABNORMAL LOW (ref 3.5–5.0)
Alkaline Phosphatase: 61 U/L (ref 38–126)
Anion gap: 12 (ref 5–15)
BUN: 22 mg/dL (ref 8–23)
CO2: 21 mmol/L — ABNORMAL LOW (ref 22–32)
Calcium: 9 mg/dL (ref 8.9–10.3)
Chloride: 106 mmol/L (ref 98–111)
Creatinine, Ser: 1.09 mg/dL (ref 0.61–1.24)
GFR, Estimated: >60 mL/min (ref >60)
Glucose, Bld: 133 mg/dL — ABNORMAL HIGH (ref 70–99)
Potassium: 3.9 mmol/L (ref 3.5–5.1)
Sodium: 139 mmol/L (ref 135–145)
Total Bilirubin: 0.5 mg/dL (ref 0.3–1.2)
Total Protein: 6 g/dL — ABNORMAL LOW (ref 6.5–8.1)

## 2020-04-30 LAB — RESPIRATORY PANEL BY RT PCR (FLU A&B, COVID)
Influenza A by PCR: NEGATIVE
Influenza B by PCR: NEGATIVE
SARS Coronavirus 2 by RT PCR: NEGATIVE

## 2020-04-30 LAB — GLUCOSE, CAPILLARY
Glucose-Capillary: 140 mg/dL — ABNORMAL HIGH (ref 70–99)
Glucose-Capillary: 133 mg/dL — ABNORMAL HIGH (ref 70–99)

## 2020-04-30 LAB — CREATININE, SERUM
Creatinine, Ser: 1.08 mg/dL (ref 0.61–1.24)
GFR, Estimated: >60 mL/min (ref >60)

## 2020-04-30 LAB — TROPONIN I (HIGH SENSITIVITY)
Troponin I (High Sensitivity): 40 ng/L — ABNORMAL HIGH (ref <18)
Troponin I (High Sensitivity): 1352 ng/L (ref <18)

## 2020-04-30 SURGERY — CORONARY/GRAFT ACUTE MI REVASCULARIZATION
Anesthesia: LOCAL

## 2020-04-30 MED ORDER — HEPARIN (PORCINE) IN NACL 1000-0.9 UT/500ML-% IV SOLN
INTRAVENOUS | Status: AC
  Filled 2020-04-30: qty 1000

## 2020-04-30 MED ORDER — CLOPIDOGREL BISULFATE 300 MG PO TABS
ORAL_TABLET | ORAL | Status: DC | PRN
  Administered 2020-04-30: 600 mg via ORAL

## 2020-04-30 MED ORDER — HEPARIN SODIUM (PORCINE) 1000 UNIT/ML IJ SOLN
INTRAMUSCULAR | Status: DC | PRN
  Administered 2020-04-30: 14000 [IU] via INTRAVENOUS

## 2020-04-30 MED ORDER — LIDOCAINE HCL (PF) 1 % IJ SOLN
INTRAMUSCULAR | Status: AC
  Filled 2020-04-30: qty 30

## 2020-04-30 MED ORDER — LIDOCAINE HCL (PF) 1 % IJ SOLN
INTRAMUSCULAR | Status: DC | PRN
  Administered 2020-04-30: 2 mL

## 2020-04-30 MED ORDER — ONDANSETRON HCL 4 MG/2ML IJ SOLN: 4.0000 mg | Freq: Four times a day (QID) | INTRAMUSCULAR | Status: DC | PRN

## 2020-04-30 MED ORDER — ENOXAPARIN SODIUM 40 MG/0.4ML ~~LOC~~ SOLN
40.0000 mg | SUBCUTANEOUS | Status: DC
  Administered 2020-05-01 – 2020-05-02 (×2): 40 mg via SUBCUTANEOUS
  Filled 2020-04-30 (×2): qty 0.4

## 2020-04-30 MED ORDER — HEPARIN SODIUM (PORCINE) 5000 UNIT/ML IJ SOLN: 5000.0000 [IU] | Freq: Three times a day (TID) | INTRAMUSCULAR | Status: DC

## 2020-04-30 MED ORDER — ASPIRIN EC 81 MG PO TBEC: 81.0000 mg | DELAYED_RELEASE_TABLET | Freq: Every day | ORAL | Status: DC

## 2020-04-30 MED ORDER — CLOPIDOGREL BISULFATE 300 MG PO TABS
ORAL_TABLET | ORAL | Status: AC
  Filled 2020-04-30: qty 1

## 2020-04-30 MED ORDER — INSULIN ASPART 100 UNIT/ML ~~LOC~~ SOLN
0.0000 [IU] | Freq: Three times a day (TID) | SUBCUTANEOUS | Status: DC
  Administered 2020-05-01: 3 [IU] via SUBCUTANEOUS
  Administered 2020-05-01 – 2020-05-02 (×2): 4 [IU] via SUBCUTANEOUS

## 2020-04-30 MED ORDER — ADULT MULTIVITAMIN W/MINERALS CH
1.0000 | ORAL_TABLET | Freq: Every day | ORAL | Status: DC
  Administered 2020-05-01 – 2020-05-02 (×2): 1 via ORAL
  Filled 2020-04-30 (×2): qty 1

## 2020-04-30 MED ORDER — IOHEXOL 350 MG/ML SOLN
INTRAVENOUS | Status: AC
  Filled 2020-04-30: qty 1

## 2020-04-30 MED ORDER — NITROGLYCERIN 0.4 MG SL SUBL: 0.4000 mg | SUBLINGUAL_TABLET | SUBLINGUAL | Status: DC | PRN

## 2020-04-30 MED ORDER — ACETAMINOPHEN 325 MG PO TABS
650.0000 mg | ORAL_TABLET | ORAL | Status: DC | PRN
  Administered 2020-05-02: 650 mg via ORAL
  Filled 2020-04-30: qty 2

## 2020-04-30 MED ORDER — ATORVASTATIN CALCIUM 80 MG PO TABS
80.0000 mg | ORAL_TABLET | Freq: Every day | ORAL | Status: DC
  Administered 2020-04-30 – 2020-05-02 (×3): 80 mg via ORAL
  Filled 2020-04-30 (×3): qty 1

## 2020-04-30 MED ORDER — SODIUM CHLORIDE 0.9% FLUSH
3.0000 mL | Freq: Two times a day (BID) | INTRAVENOUS | Status: DC
  Administered 2020-05-01 – 2020-05-02 (×3): 3 mL via INTRAVENOUS

## 2020-04-30 MED ORDER — PROSTATE SUPPORT 300-15 MG PO TABS: 1.0000 | ORAL_TABLET | Freq: Every day | ORAL | Status: DC

## 2020-04-30 MED ORDER — FUROSEMIDE 10 MG/ML IJ SOLN
40.0000 mg | Freq: Two times a day (BID) | INTRAMUSCULAR | Status: DC
  Administered 2020-04-30 – 2020-05-02 (×4): 40 mg via INTRAVENOUS
  Filled 2020-04-30 (×5): qty 4

## 2020-04-30 MED ORDER — NITROGLYCERIN 1 MG/10 ML FOR IR/CATH LAB
INTRA_ARTERIAL | Status: DC | PRN
  Administered 2020-04-30: 200 ug via INTRACORONARY

## 2020-04-30 MED ORDER — METOPROLOL TARTRATE 25 MG PO TABS
37.5000 mg | ORAL_TABLET | Freq: Two times a day (BID) | ORAL | Status: DC
  Administered 2020-04-30 – 2020-05-02 (×5): 37.5 mg via ORAL
  Filled 2020-04-30: qty 1
  Filled 2020-04-30 (×2): qty 3
  Filled 2020-04-30 (×2): qty 1

## 2020-04-30 MED ORDER — HEPARIN SODIUM (PORCINE) 1000 UNIT/ML IJ SOLN
INTRAMUSCULAR | Status: AC
  Filled 2020-04-30: qty 1

## 2020-04-30 MED ORDER — SODIUM CHLORIDE 0.9% FLUSH: 3.0000 mL | INTRAVENOUS | Status: DC | PRN

## 2020-04-30 MED ORDER — ENOXAPARIN SODIUM 40 MG/0.4ML ~~LOC~~ SOLN: 40.0000 mg | SUBCUTANEOUS | Status: DC

## 2020-04-30 MED ORDER — ASPIRIN 81 MG PO CHEW: 324.0000 mg | CHEWABLE_TABLET | ORAL | Status: DC

## 2020-04-30 MED ORDER — ASPIRIN EC 81 MG PO TBEC
81.0000 mg | DELAYED_RELEASE_TABLET | Freq: Every day | ORAL | Status: DC
  Administered 2020-04-30 – 2020-05-02 (×3): 81 mg via ORAL
  Filled 2020-04-30 (×3): qty 1

## 2020-04-30 MED ORDER — ASPIRIN 300 MG RE SUPP: 300.0000 mg | RECTAL | Status: DC

## 2020-04-30 MED ORDER — IOHEXOL 350 MG/ML SOLN
INTRAVENOUS | Status: DC | PRN
  Administered 2020-04-30: 150 mL

## 2020-04-30 MED ORDER — ACETAMINOPHEN 325 MG PO TABS: 650.0000 mg | ORAL_TABLET | ORAL | Status: DC | PRN

## 2020-04-30 MED ORDER — CLOPIDOGREL BISULFATE 75 MG PO TABS
75.0000 mg | ORAL_TABLET | Freq: Every day | ORAL | Status: DC
  Administered 2020-05-01 – 2020-05-02 (×2): 75 mg via ORAL
  Filled 2020-04-30 (×2): qty 1

## 2020-04-30 MED ORDER — HEPARIN (PORCINE) IN NACL 1000-0.9 UT/500ML-% IV SOLN
INTRAVENOUS | Status: DC | PRN
  Administered 2020-04-30: 500 mL

## 2020-04-30 MED ORDER — VERAPAMIL HCL 2.5 MG/ML IV SOLN
INTRAVENOUS | Status: DC | PRN
  Administered 2020-04-30: 10 mL via INTRA_ARTERIAL

## 2020-04-30 MED ORDER — SODIUM CHLORIDE 0.9 % IV SOLN: 250.0000 mL | INTRAVENOUS | Status: DC | PRN

## 2020-04-30 MED ORDER — PANTOPRAZOLE SODIUM 40 MG PO TBEC
40.0000 mg | DELAYED_RELEASE_TABLET | Freq: Every day | ORAL | Status: DC
  Administered 2020-05-01 – 2020-05-02 (×2): 40 mg via ORAL
  Filled 2020-04-30 (×2): qty 1

## 2020-04-30 MED ORDER — MUPIROCIN 2 % EX OINT
1.0000 "application " | TOPICAL_OINTMENT | Freq: Two times a day (BID) | CUTANEOUS | Status: DC
  Administered 2020-04-30 – 2020-05-02 (×4): 1 via NASAL
  Filled 2020-04-30 (×2): qty 22

## 2020-04-30 MED ORDER — VERAPAMIL HCL 2.5 MG/ML IV SOLN
INTRAVENOUS | Status: AC
  Filled 2020-04-30: qty 2

## 2020-04-30 MED ORDER — ASPIRIN 81 MG PO CHEW: 81.0000 mg | CHEWABLE_TABLET | Freq: Every day | ORAL | Status: DC

## 2020-04-30 MED ORDER — CHLORHEXIDINE GLUCONATE CLOTH 2 % EX PADS
6.0000 | MEDICATED_PAD | Freq: Every day | CUTANEOUS | Status: DC
  Administered 2020-05-01 – 2020-05-02 (×2): 6 via TOPICAL

## 2020-04-30 SURGICAL SUPPLY — 16 items
BALLN SAPPHIRE 2.0X15 (BALLOONS) ×2
BALLOON SAPPHIRE 2.0X15 (BALLOONS) ×1 IMPLANT
CATH 5FR JL3.5 JR4 ANG PIG MP (CATHETERS) ×2 IMPLANT
CATH VISTA GUIDE 6FR XBLAD3.5 (CATHETERS) ×2 IMPLANT
DEVICE RAD COMP TR BAND LRG (VASCULAR PRODUCTS) ×2 IMPLANT
GLIDESHEATH SLEND SS 6F .021 (SHEATH) ×2 IMPLANT
GUIDEWIRE INQWIRE 1.5J.035X260 (WIRE) ×1 IMPLANT
INQWIRE 1.5J .035X260CM (WIRE) ×2
KIT ENCORE 26 ADVANTAGE (KITS) ×2 IMPLANT
KIT HEART LEFT (KITS) ×2 IMPLANT
PACK CARDIAC CATHETERIZATION (CUSTOM PROCEDURE TRAY) ×2 IMPLANT
STENT RESOLUTE ONYX 2.5X34 (Permanent Stent) ×2 IMPLANT
SYR MEDRAD MARK 7 150ML (SYRINGE) ×2 IMPLANT
TRANSDUCER W/STOPCOCK (MISCELLANEOUS) ×2 IMPLANT
TUBING CIL FLEX 10 FLL-RA (TUBING) ×2 IMPLANT
WIRE ASAHI PROWATER 180CM (WIRE) ×2 IMPLANT

## 2020-04-30 NOTE — Progress Notes (Signed)
At 1505 patient stated had to void very bad,couldn't use urinal. RN assisted patient to the bathroom, patient called 2 minutes later, RN was in the room, checked on patient and the  Patient was bleeding from his cath site (right radial). I could not visualize the site of the bleeding and removed TR band to better visualize. Held pressure on the radial artery, assisted back to bed. Pt was feeling dizzy, VSS. RN was having difficulty controlling bleeding. After 20 minutes of pressure, site is now level 1 with a small bruised area around site. Pulses are +2. Pt no longer symptomatic. Pressure dressing applied. Continue to monitor.Ellamae Sia

## 2020-04-30 NOTE — Progress Notes (Signed)
Patient refused use of CPAP for the evening 

## 2020-04-30 NOTE — Progress Notes (Signed)
[id number] Left MI booklet, heart healthy diet and brochure for CRP 2 [place] at bedside. Discussed with pt referral to [facility]. Will follow up Monday for ambulation and education as pt just had procedure today. Graylon Good RN BSN 04/30/2020 2:36 PM

## 2020-04-30 NOTE — H&P (Signed)
Cardiology Admission History and Physical:   Patient ID: Ryan Garner MRN: [id number]; DOB: 25-Apr-1946   Admission date: 04/30/2020  Primary Care Provider: [name], [name], MD [facility] Cardiologist: Dr. Harrington Challenger, MD  [facility] Electrophysiologist:  Dr. Lovena Le, MD   Chief Complaint:  STEMI  Patient Profile:   Ryan Garner is a 74 y.o. male with a history of recent new onset atrial fibrillation, hyperlipidemia, hypertension, morbid obesity, AKI, hypokalemia, hyponatremia, prediabetes who presented to [facility] on 04/30/2020 as an acute STEMI.  History of Present Illness:   Ryan Garner is a 74 year old male with a history as stated above who presented to [facility] on 04/30/2020 after recent onset of chest pain.  He states that several days ago he had one episode of chest pain with radiation to his left arm at which time he took one of his wife's sublingual nitroglycerin tablets with relief.  Pain occurred once again earlier this morning however was more pronounced.  He took 2 sublingual nitroglycerin tablets without relief therefore he called EMS for further evaluation.  On EMS arrival, EKG consistent with lateral STEMI therefore he was brought emergently to the cardiac Cath Lab which showed obstructive LCx disease therefore PCI/DES was placed successfully.  LHC cath report to follow.  He was recently seen 04/04/2020 after a recent hospitalization 02/2020 for abdominal pain at which time he was diagnosed with acute cholecystitis and underwent laparoscopic cholecystectomy.  He was noted to be in new onset atrial fibrillation with RVR as an incidental finding.  CHA2DS2-VASc score found to be 4 therefore he was started on heparin infusion which was held for surgery.  Cardiology was consulted and recommended follow-up at the atrial fibrillation clinic and he was placed on Eliquis prior to discharge.  At follow-up, he had complaints of exertional shortness of breath and heart rate was found to be elevated  therefore metoprolol was increased to 25 mg p.o. twice daily with plans for possible DCCV.  He was then seen by Dr. Lovena Le 04/21/2020 with recommendations to start flecainide and undergo DCCV given symptomatic AF.  Past Medical History:  Diagnosis Date  . Hyperlipidemia   . Hypertension   . Metabolic syndrome   . Metabolic syndrome   . Prediabetes     Past Surgical History:  Procedure Laterality Date  . APPENDECTOMY    . CHOLECYSTECTOMY N/A 03/02/2020   Procedure: LAPAROSCOPIC CHOLECYSTECTOMY;  Surgeon: Aviva Signs, MD;  Location: [facility];  Service: General;  Laterality: N/A;  . Eyelid Surgery Bilateral   . HERNIA REPAIR    . knee tendons repair    . ROTATOR CUFF REPAIR Bilateral   . TONSILLECTOMY       Medications Prior to Admission: Prior to Admission medications   Medication Sig Start Date End Date Taking? Authorizing Provider  apixaban (ELIQUIS) 5 MG TABS tablet Take 1 tablet (5 mg total) by mouth 2 (two) times daily. 04/04/20   Verta Ellen., NP  flecainide (TAMBOCOR) 100 MG tablet Take 1/2 tablet ( 50 mg ) twice a day for 1 week then increase to 1 tablet ( 100 mg ) twice a day Patient taking differently: Take 100 mg by mouth 2 (two) times daily.  04/21/20   Evans Lance, MD  fluticasone (FLONASE) 50 MCG/ACT nasal spray Place 2 sprays into both nostrils daily. Patient taking differently: Place 2 sprays into both nostrils daily as needed for allergies.  07/19/16   Eustaquio Maize, MD  furosemide (LASIX) 20 MG tablet Take 1 tablet (  20 mg total) by mouth daily as needed for fluid or edema. Patient taking differently: Take 20 mg by mouth daily.  04/20/20   [name], [name], MD  metoprolol tartrate (LOPRESSOR) 25 MG tablet Take 1.5 tablets (37.5 mg total) by mouth 2 (two) times daily. 04/14/20   [name], [name], MD  Misc Natural Products (PROSTATE SUPPORT) 300-15 MG TABS Take 1 tablet by mouth daily. Super Beta Prostate     [provider]  Multiple Vitamin  (MULTIVITAMIN WITH MINERALS) TABS tablet Take 1 tablet by mouth daily.    [provider]  nystatin (MYCOSTATIN/NYSTOP) powder Apply 1 application topically 3 (three) times daily. Patient not taking: Reported on 04/29/2020 04/14/20   [name], [name], MD  nystatin cream (MYCOSTATIN) Apply 1 application topically 2 (two) times daily. Patient not taking: Reported on 04/29/2020 04/14/20   [name], [name], MD     Allergies:    Allergies  Allergen Reactions  . Rofecoxib Other (See Comments)    (Vioxx)    Social History:   Social History   Socioeconomic History  . Marital status: Married    Spouse name: [name]  . Number of children: 2  . Years of education: 54  . Highest education level: Doctorate  Occupational History  . 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 Smoking status: Former Smoker    Types: Cigarettes    Quit date: 04/02/1959    Years since quitting: 61.1  . Smokeless tobacco: Never Used  Vaping Use  . Vaping Use: Never used  Substance and Sexual Activity  . Alcohol use: No  . Drug use: No  . Sexual activity: Yes  Other Topics Concern  . Not on file  Social History Narrative  . Not on file   Social Determinants of Health   Financial Resource Strain:   . Difficulty of Paying Living Expenses: Not on file  Food Insecurity:   . Worried About Charity fundraiser in the Last Year: Not on file  . Ran Out of Food in the Last Year: Not on file  Transportation Needs:   . Lack of Transportation (Medical): Not on file  . Lack of Transportation (Non-Medical): Not on file  Physical Activity:   . Days of Exercise per Week: Not on file  . Minutes of Exercise per Session: Not on file  Stress: Stress Concern Present  . Feeling of Stress : To some extent  Social Connections:   . Frequency of Communication with Friends and Family: Not on file  . Frequency of Social Gatherings  with Friends and Family: Not on file  . Attends Religious Services: Not on file  . Active Member of Clubs or Organizations: Not on file  . Attends Archivist Meetings: Not on file  . Marital Status: Not on file  Intimate Partner Violence:   . Fear of Current or Ex-Partner: Not on file  . Emotionally Abused: Not on file  . Physically Abused: Not on file  . Sexually Abused: Not on file    Family History:   The patient's family history includes Arthritis in his father; Cancer in his mother; Coronary artery disease in his mother; Diabetes in his brother and maternal grandmother; Heart attack in his brother.    ROS:  Please see the history of present illness.  All other ROS reviewed and negative.     Physical Exam/Data:  Vitals:   04/30/20 1154 04/30/20 1159 04/30/20 1201 04/30/20 1204  BP: 118/79 121/82  129/85  Pulse: 71 68  69  Resp: (!) 23 (!) 22  15  SpO2: (!) 0% (!) 0% 97% (!) 0%   No intake or output data in the 24 hours ending 04/30/20 1308 Last 3 Weights 04/21/2020 04/14/2020 04/04/2020  Weight (lbs) 310 lb 3.2 oz 309 lb 306 lb  Weight (kg) 140.706 kg 140.161 kg 138.801 kg     There is no height or weight on file to calculate BMI.   General: Obese, NAD Neck: Negative for carotid bruits. No JVD Lungs:Clear to ausculation bilaterally. No wheezes, rales, or rhonchi. Breathing is unlabored. Cardiovascular: RRR with S1 S2. No murmurs Abdomen: Soft, non-tender, distended. No obvious abdominal masses. Extremities: 2+ BLE edema. Radial pulses 2+ bilaterally Neuro: Alert and oriented. No focal deficits. No facial asymmetry. MAE spontaneously. Psych: Responds to questions appropriately with normal affect.    Relevant CV Studies:  LHC 04/30/2020:  Full cath report pending  Laboratory Data:  High Sensitivity Troponin:   Recent Labs  Lab 04/30/20 1130  TROPONINIHS 40*      Chemistry Recent Labs  Lab 04/29/20 1246 04/30/20 1130  NA 139 139  K 4.1 3.9  CL  103 106  CO2 21 21*  GLUCOSE 149* 133*  BUN 25 22  CREATININE 1.24 1.09  CALCIUM 9.4 9.0  GFRNONAA 56* >60  GFRAA 65  --   ANIONGAP  --  12    Recent Labs  Lab 04/30/20 1130  PROT 6.0*  ALBUMIN 3.3*  AST 19  ALT 31  ALKPHOS 61  BILITOT 0.5   Hematology Recent Labs  Lab 04/30/20 1130  WBC 11.1*  RBC 4.55  HGB 13.0  HCT 40.8  MCV 89.7  MCH 28.6  MCHC 31.9  RDW 13.9  PLT 286   BNPNo results for input(s): BNP, PROBNP in the last 168 hours.  DDimer No results for input(s): DDIMER in the last 168 hours.   Radiology/Studies:  No results found. Assessment and Plan:   1.  Acute STEMI: -Presented to [facility] on 04/30/2020 after recent onset of chest pain.  He states that several days ago he had one episode of chest pain with radiation to his left arm at which time he took one of his wife's sublingual nitroglycerin tablets with relief.  Pain occurred once again earlier this morning however was more pronounced.  He took 2 sublingual nitroglycerin tablets without relief therefore he called EMS for further evaluation. -EKG consistent with lateral STEMI therefore he was brought emergently to the cardiac lab -LHC with obstructive LCx disease therefore PCI/DES was placed successfully.  -Full LHC cath report to follow per Dr. Martinique  -Trend hsT  -Recommendations for ASA and Plavix for DAPT -Continue metoprolol tartrate, high intensity statin -Obtain lipid panel, hemoglobin A1c for risk stratification -Echocardiogram? -Started on IV Lasix 40 mg daily for fluid volume overload  2.  Recent dx atrial fibrillation: -Recently diagnosed with atrial fibrillation found during a hospitalization for acute cholecystitis at which time he underwent a laparoscopic cholecystectomy.  He was seen by cardiology at that time and placed on Eliquis therapy.  At outpatient follow-up, patient remained in atrial fibrillation therefore plan was for referral to EP with potential DCCV.  He was seen by Dr. Lovena Le  thereafter and was started on flecainide with plans for DCCV cardioversion. -Stop flecainide in the setting of CAD -Plan for rate control at this time -Remains in  atrial fibrillation however heart rates in the 70s to 80s -Continue beta-blocker  3.  LE edema: -Check echocardiogram -Continue IV Lasix 40 mg daily -Follow creatinine closely, last creatinine 1.09  4.  HTN: -Stable, 129/85, 121/82, 118/79 -Continue current regimen  5.  HLD: -Last LDL, 78 with LDL goal <70 -Continue high intensity statin -Repeat lipid panel in 6 to 8 weeks with LFTs  6.  Morbid obesity: -Weight, 140.7 kg -Needs lifestyle modification, weight management strategies       TIMI Risk Score for ST  Elevation MI:   The patient's TIMI risk score is  , which indicates a  % risk of all cause mortality at 30 days.   New York Heart Association (NYHA) Functional Class NYHA Class II   CHA2DS2-VASc Score =   4This indicates a  % annual risk of stroke. The patient's score is based upon:      Severity of Illness: The appropriate patient status for this patient is INPATIENT. Inpatient status is judged to be reasonable and necessary in order to provide the required intensity of service to ensure the patient's safety. The patient's presenting symptoms, physical exam findings, and initial radiographic and laboratory data in the context of their chronic comorbidities is felt to place them at high risk for further clinical deterioration. Furthermore, it is not anticipated that the patient will be medically stable for discharge from the hospital within 2 midnights of admission. The following factors support the patient status of inpatient.   " The patient's presenting symptoms include chest pain/STEMI. " The worrisome physical exam findings include EKG changes . " The initial radiographic and laboratory data are worrisome because of EKG changes. " The chronic co-morbidities include obesity, HTN, HLD.   * I certify  that at the point of admission it is my clinical judgment that the patient will require inpatient hospital care spanning beyond 2 midnights from the point of admission due to high intensity of service, high risk for further deterioration and high frequency of surveillance required.*    For questions or updates, please contact [place] Please consult [email or web address] for contact info under     Signed, Kathyrn Drown, NP  04/30/2020 1:08 PM

## 2020-05-01 ENCOUNTER — Other Ambulatory Visit: Payer: Self-pay

## 2020-05-01 ENCOUNTER — Inpatient Hospital Stay (HOSPITAL_COMMUNITY): Payer: Medicare Other

## 2020-05-01 DIAGNOSIS — I351 Nonrheumatic aortic (valve) insufficiency: Secondary | ICD-10-CM

## 2020-05-01 DIAGNOSIS — I340 Nonrheumatic mitral (valve) insufficiency: Secondary | ICD-10-CM

## 2020-05-01 DIAGNOSIS — I4819 Other persistent atrial fibrillation: Secondary | ICD-10-CM | POA: Diagnosis not present

## 2020-05-01 DIAGNOSIS — I361 Nonrheumatic tricuspid (valve) insufficiency: Secondary | ICD-10-CM

## 2020-05-01 DIAGNOSIS — I2121 ST elevation (STEMI) myocardial infarction involving left circumflex coronary artery: Secondary | ICD-10-CM | POA: Diagnosis not present

## 2020-05-01 LAB — ECHOCARDIOGRAM COMPLETE
AR max vel: 1.06 cm2
AV Area VTI: 1.04 cm2
AV Area mean vel: 1.07 cm2
AV Mean grad: 15.3 mmHg
AV Peak grad: 29.2 mmHg
Ao pk vel: 2.7 m/s
S' Lateral: 2.8 cm

## 2020-05-01 LAB — BASIC METABOLIC PANEL
Anion gap: 10 (ref 5–15)
BUN: 16 mg/dL (ref 8–23)
CO2: 29 mmol/L (ref 22–32)
Calcium: 9.2 mg/dL (ref 8.9–10.3)
Chloride: 99 mmol/L (ref 98–111)
Creatinine, Ser: 1.1 mg/dL (ref 0.61–1.24)
GFR, Estimated: >60 mL/min (ref >60)
Glucose, Bld: 112 mg/dL — ABNORMAL HIGH (ref 70–99)
Potassium: 3.8 mmol/L (ref 3.5–5.1)
Sodium: 138 mmol/L (ref 135–145)

## 2020-05-01 LAB — CBC
HCT: 42.6 % (ref 39.0–52.0)
Hemoglobin: 13.5 g/dL (ref 13.0–17.0)
MCH: 28.7 pg (ref 26.0–34.0)
MCHC: 31.7 g/dL (ref 30.0–36.0)
MCV: 90.6 fL (ref 80.0–100.0)
Platelets: 283 10*3/uL (ref 150–400)
RBC: 4.7 MIL/uL (ref 4.22–5.81)
RDW: 13.8 % (ref 11.5–15.5)
WBC: 11.6 10*3/uL — ABNORMAL HIGH (ref 4.0–10.5)
nRBC: 0 % (ref 0.0–0.2)

## 2020-05-01 LAB — GLUCOSE, CAPILLARY
Glucose-Capillary: 142 mg/dL — ABNORMAL HIGH (ref 70–99)
Glucose-Capillary: 176 mg/dL — ABNORMAL HIGH (ref 70–99)
Glucose-Capillary: 127 mg/dL — ABNORMAL HIGH (ref 70–99)
Glucose-Capillary: 115 mg/dL — ABNORMAL HIGH (ref 70–99)
Glucose-Capillary: 114 mg/dL — ABNORMAL HIGH (ref 70–99)

## 2020-05-01 LAB — LIPID PANEL
Cholesterol: 122 mg/dL (ref 0–200)
HDL: 30 mg/dL — ABNORMAL LOW (ref >40)
LDL Cholesterol: 79 mg/dL (ref 0–99)
Total CHOL/HDL Ratio: 4.1 RATIO
Triglycerides: 63 mg/dL (ref <150)
VLDL: 13 mg/dL (ref 0–40)

## 2020-05-01 NOTE — Progress Notes (Signed)
  Echocardiogram 2D Echocardiogram has been performed.  Ryan Garner 05/01/2020, 9:14 AM

## 2020-05-01 NOTE — Progress Notes (Signed)
Progress Note  Patient Name: Ryan Garner Date of Encounter: 05/01/2020  [facility] Cardiologist: Dorris Carnes, MD   Subjective   74 year old gentleman who presented for an ST segment elevation myocardial infarction yesterday.  He was taken emergently to the Cath Lab and had PCI of his left circumflex artery with a DES.  Trop was 1352    He has a history of atrial fibrillation and had been placed on flecainide.  Flecainide has been discontinued.  Lipid panel from yesterday reveals a total cholesterol of 123.  The HDL is 27.  LDL 78.  Triglycerides are 88.  Inpatient Medications    Scheduled Meds: . aspirin EC  81 mg Oral Daily  . atorvastatin  80 mg Oral Daily  . Chlorhexidine Gluconate Cloth  6 each Topical Q0600  . clopidogrel  75 mg Oral Q breakfast  . enoxaparin (LOVENOX) injection  40 mg Subcutaneous Q24H  . furosemide  40 mg Intravenous Q12H  . insulin aspart  0-20 Units Subcutaneous TID WC  . metoprolol tartrate  37.5 mg Oral BID  . multivitamin with minerals  1 tablet Oral Daily  . mupirocin ointment  1 application Nasal BID  . pantoprazole  40 mg Oral Daily  . sodium chloride flush  3 mL Intravenous Q12H   Continuous Infusions: . sodium chloride     PRN Meds: sodium chloride, acetaminophen, nitroGLYCERIN, ondansetron (ZOFRAN) IV, sodium chloride flush   Vital Signs    Vitals:   05/01/20 0500 05/01/20 0623 05/01/20 0938 05/01/20 0942  BP: 112/72 92/73  (!) 127/92  Pulse: 65  88 77  Resp: (!) 21 18  20   Temp:  98.7 F (37.1 C)  97.7 F (36.5 C)  TempSrc:  Oral  Oral  SpO2: 100%   98%    Intake/Output Summary (Last 24 hours) at 05/01/2020 1158 Last data filed at 05/01/2020 0953 Gross per 24 hour  Intake 1400 ml  Output 3525 ml  Net -2125 ml   Last 3 Weights 04/21/2020 04/14/2020 04/04/2020  Weight (lbs) 310 lb 3.2 oz 309 lb 306 lb  Weight (kg) 140.706 kg 140.161 kg 138.801 kg      Telemetry    Atrial fib - Personally Reviewed  ECG      - Personally Reviewed  Physical Exam   GEN:  elderly man .  nad   Neck: No JVD Cardiac:  irreg. Irreg.   Respiratory: Clear to auscultation bilaterally. GI: Soft, nontender, non-distended  MS: R radial cath site is still oozing .  Neuro:  Nonfocal  Psych: Normal affect   Labs    High Sensitivity Troponin:   Recent Labs  Lab 04/30/20 1130 04/30/20 1620  TROPONINIHS 40* 1,352*      Chemistry Recent Labs  Lab 04/29/20 1246 04/30/20 1130 04/30/20 1620  NA 139 139  --   K 4.1 3.9  --   CL 103 106  --   CO2 21 21*  --   GLUCOSE 149* 133*  --   BUN 25 22  --   CREATININE 1.24 1.09 1.08  CALCIUM 9.4 9.0  --   PROT  --  6.0*  --   ALBUMIN  --  3.3*  --   AST  --  19  --   ALT  --  31  --   ALKPHOS  --  61  --   BILITOT  --  0.5  --   GFRNONAA 56* >60 >60  GFRAA 65  --   --  ANIONGAP  --  12  --      Hematology Recent Labs  Lab 04/30/20 1130 04/30/20 1620  WBC 11.1* 10.5  RBC 4.55 4.62  HGB 13.0 13.3  HCT 40.8 42.0  MCV 89.7 90.9  MCH 28.6 28.8  MCHC 31.9 31.7  RDW 13.9 14.0  PLT 286 261    BNPNo results for input(s): BNP, PROBNP in the last 168 hours.   DDimer No results for input(s): DDIMER in the last 168 hours.   Radiology    CARDIAC CATHETERIZATION  Result Date: 04/30/2020  Ramus lesion is 75% stenosed.  Mid Cx lesion is 99% stenosed.  Post intervention, there is a 0% residual stenosis.  A drug-eluting stent was successfully placed using a STENT RESOLUTE ONYX 2.5X34.  The left ventricular systolic function is normal.  LV end diastolic pressure is mildly elevated.  The left ventricular ejection fraction is 55-65% by visual estimate.  1. 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 Good LV systolic function 3. Elevated LVEDP 4. Successful PCI of the mid to distal LCx with DES x 1. Plan: ASA 81 mg daily for one month. Plavix 75 mg daily for one year. May resume Eliquis tomorrow. IV diuresis. Will need to stop Flecainide.  Consider alternative AAD therapy.    Cardiac Studies      Patient Profile     74 y.o. male with Afib, admitted with a STEMI   Assessment & Plan    1.  Coronary artery disease: The patient was admitted with a lateral wall ST segment elevation myocardial infarction.  He had successful stenting of his left circumflex artery. He is feeling much better and is not having any further episodes of chest discomfort.  Troponin peaked at 1352.  His right radial cath site is still oozing.  We will need to continue to hold his Eliquis until this is stabilized.  We will potentially restart tonight if his bruising has resolved.  2.  Atrial fibrillation: The patient remains in atrial fibrillation.  He was scheduled for cardioversion in several weeks but this will likely need to be postponed.  He had been started on flecainide but this has been discontinued because of his recent diagnosis of coronary artery disease.  Restart Eliquis tonight if his cath site has been stabilized. More likely, will restart eliquis tomorrow am    For questions or updates, please contact [place] Please consult [email or web address] for contact info under        Signed, Mertie Moores, MD  05/01/2020, 11:58 AM

## 2020-05-02 ENCOUNTER — Other Ambulatory Visit (HOSPITAL_COMMUNITY): Payer: Self-pay | Admitting: Cardiology

## 2020-05-02 ENCOUNTER — Encounter (HOSPITAL_COMMUNITY): Payer: Self-pay | Admitting: Cardiology

## 2020-05-02 DIAGNOSIS — I4819 Other persistent atrial fibrillation: Secondary | ICD-10-CM

## 2020-05-02 DIAGNOSIS — I2121 ST elevation (STEMI) myocardial infarction involving left circumflex coronary artery: Secondary | ICD-10-CM | POA: Diagnosis not present

## 2020-05-02 LAB — POCT I-STAT, CHEM 8
BUN: 22 mg/dL (ref 8–23)
Calcium, Ion: 1.25 mmol/L (ref 1.15–1.40)
Chloride: 105 mmol/L (ref 98–111)
Creatinine, Ser: 0.9 mg/dL (ref 0.61–1.24)
Glucose, Bld: 133 mg/dL — ABNORMAL HIGH (ref 70–99)
HCT: 40 % (ref 39.0–52.0)
Hemoglobin: 13.6 g/dL (ref 13.0–17.0)
Potassium: 3.9 mmol/L (ref 3.5–5.1)
Sodium: 140 mmol/L (ref 135–145)
TCO2: 21 mmol/L — ABNORMAL LOW (ref 22–32)

## 2020-05-02 LAB — BASIC METABOLIC PANEL
Anion gap: 11 (ref 5–15)
BUN: 16 mg/dL (ref 8–23)
CO2: 28 mmol/L (ref 22–32)
Calcium: 9.3 mg/dL (ref 8.9–10.3)
Chloride: 96 mmol/L — ABNORMAL LOW (ref 98–111)
Creatinine, Ser: 1.1 mg/dL (ref 0.61–1.24)
GFR, Estimated: >60 mL/min (ref >60)
Glucose, Bld: 155 mg/dL — ABNORMAL HIGH (ref 70–99)
Potassium: 3.6 mmol/L (ref 3.5–5.1)
Sodium: 135 mmol/L (ref 135–145)

## 2020-05-02 LAB — GLUCOSE, CAPILLARY
Glucose-Capillary: 200 mg/dL — ABNORMAL HIGH (ref 70–99)
Glucose-Capillary: 116 mg/dL — ABNORMAL HIGH (ref 70–99)
Glucose-Capillary: 119 mg/dL — ABNORMAL HIGH (ref 70–99)

## 2020-05-02 LAB — POCT ACTIVATED CLOTTING TIME: Activated Clotting Time: 340 seconds

## 2020-05-02 MED ORDER — APIXABAN 5 MG PO TABS
5.0000 mg | ORAL_TABLET | Freq: Two times a day (BID) | ORAL | Status: DC
  Administered 2020-05-02: 5 mg via ORAL
  Filled 2020-05-02: qty 1

## 2020-05-02 MED ORDER — ATORVASTATIN CALCIUM 80 MG PO TABS
80.0000 mg | ORAL_TABLET | Freq: Every day | ORAL | 1 refills | Status: DC
Start: 2020-05-03 — End: 2020-12-12

## 2020-05-02 MED ORDER — ASPIRIN 81 MG PO TBEC: 81.0000 mg | DELAYED_RELEASE_TABLET | Freq: Every day | ORAL | 0 refills | Status: DC

## 2020-05-02 MED ORDER — NITROGLYCERIN 0.4 MG SL SUBL: 0.4000 mg | SUBLINGUAL_TABLET | SUBLINGUAL | 2 refills | Status: DC | PRN

## 2020-05-02 MED ORDER — FUROSEMIDE 40 MG PO TABS: 40.0000 mg | ORAL_TABLET | Freq: Every day | ORAL | Status: DC

## 2020-05-02 MED ORDER — FUROSEMIDE 40 MG PO TABS
40.0000 mg | ORAL_TABLET | Freq: Every day | ORAL | 0 refills | Status: DC
Start: 2020-05-03 — End: 2020-06-07

## 2020-05-02 MED ORDER — POTASSIUM CHLORIDE CRYS ER 20 MEQ PO TBCR
40.0000 meq | EXTENDED_RELEASE_TABLET | Freq: Once | ORAL | Status: AC
  Administered 2020-05-02: 40 meq via ORAL
  Filled 2020-05-02: qty 2

## 2020-05-02 MED ORDER — CLOPIDOGREL BISULFATE 75 MG PO TABS
75.0000 mg | ORAL_TABLET | Freq: Every day | ORAL | 2 refills | Status: DC
Start: 2020-05-03 — End: 2022-10-11

## 2020-05-02 MED FILL — ASPIRIN LOW DOSE 81 MG TBEC: 81 | 30 days supply | Qty: 30 | Fill #0

## 2020-05-02 MED FILL — FUROSEMIDE 40 MG TABLET: 40 | 30 days supply | Qty: 30 | Fill #0

## 2020-05-02 MED FILL — CLOPIDOGREL 75 MG TABLET: 75 | 30 days supply | Qty: 30 | Fill #0

## 2020-05-02 MED FILL — NITROGLYCERIN 0.4 MG TAB SL: 0.4 | 7 days supply | Qty: 25 | Fill #0

## 2020-05-02 MED FILL — ATORVASTATIN CALCIUM 80 MG: 80 | 30 days supply | Qty: 30 | Fill #0

## 2020-05-02 MED FILL — Heparin Sod (Porcine)-NaCl IV Soln 1000 Unit/500ML-0.9%: INTRAVENOUS | Qty: 500 | Status: AC

## 2020-05-02 NOTE — Consult Note (Addendum)
Cardiology Consultation:   Patient ID: Ryan Garner MRN: [id number]; DOB: October 04, 1945  Admit date: 04/30/2020 Date of Consult: 05/02/2020  Primary Care Provider: [name], [name], MD [facility] Cardiologist: Ryan Carnes, MD  [facility] Electrophysiologist:  Dr. Lovena Garner   Patient Profile:   Ryan Garner is a 74 y.o. male with a hx of HTN, HLD, pre-DM, morbid obesity, a fairly new dx of AFib, and now CAD  who is being seen today for the evaluation of AFib at the request of Ryan Garner.  History of Present Illness:   Ryan Garner was 1st found with Afib during a hospitalization Aug 2021 for acute chole (gangronous GB), post-op started on eliquis for a/c and planned to f/u with the AFib clinic. At cardiology f/u mentioned DOE, no CP, referred to EP it seems with some suspicion his DOE was 2/2 the AFib He saw Dr. Lovena Garner 04/11/2020, noted persistent AFib with CVR. He was skeptical of the success of rhythm control strategy given his norbid obesity, though given his DOE felt reasonable to try and started on flecainide (with his metoprolol).  He was admitted to [facility] 04/30/2020 with CP and STEMI, he was taken emergently to the cath lab and had PCI of the LCx with DES. His flecainide stopped  EP is asked to weigh in on rhythm control strategy going forward  LABS 05/01/20 K+ 3.8 BUN/Creat 29/1.10 HS Trop 40 > 1352 WBC 11.6 H/H 13/42 Plts 283  He is OOB to chair, no SOB, no CP  Past Medical History:  Diagnosis Date   Hyperlipidemia    Hypertension    Metabolic syndrome    Metabolic syndrome    Prediabetes     Past Surgical History:  Procedure Laterality Date   APPENDECTOMY     CHOLECYSTECTOMY N/A 03/02/2020   Procedure: LAPAROSCOPIC CHOLECYSTECTOMY;  Surgeon: Ryan Signs, MD;  Location: [facility];  Service: General;  Laterality: N/A;   CORONARY STENT INTERVENTION N/A 04/30/2020   Procedure: CORONARY STENT INTERVENTION;  Surgeon: Martinique, Peter M, MD;  Location: [place] [facility];  Service: Cardiovascular;  Laterality: N/A;   CORONARY/GRAFT ACUTE MI REVASCULARIZATION N/A 04/30/2020   Procedure: Coronary/Graft Acute MI Revascularization;  Surgeon: Martinique, Peter M, MD;  Location: [place] [facility];  Service: Cardiovascular;  Laterality: N/A;   Eyelid Surgery Bilateral    HERNIA REPAIR     knee tendons repair     LEFT HEART CATH AND CORONARY ANGIOGRAPHY N/A 04/30/2020   Procedure: LEFT HEART CATH AND CORONARY ANGIOGRAPHY;  Surgeon: Martinique, Peter M, MD;  Location: [place] [facility];  Service: Cardiovascular;  Laterality: N/A;   ROTATOR CUFF REPAIR Bilateral    TONSILLECTOMY       Home Medications:  Prior to Admission medications   Medication Sig Start Date End Date Taking? Authorizing Provider  apixaban (ELIQUIS) 5 MG TABS tablet Take 1 tablet (5 mg total) by mouth 2 (two) times daily. 04/04/20  Yes Ryan Garner., NP  flecainide (TAMBOCOR) 100 MG tablet Take 1/2 tablet ( 50 mg ) twice a day for 1 week then increase to 1 tablet ( 100 mg ) twice a day Patient taking differently: Take 100 mg by mouth 2 (two) times daily.  04/21/20  Yes Ryan Lance, MD  fluticasone (FLONASE) 50 MCG/ACT nasal spray Place 2 sprays into both nostrils daily. Patient taking differently: Place 2 sprays into both nostrils daily as needed for allergies.  07/19/16  Yes Ryan Maize, MD  furosemide (LASIX) 20  MG tablet Take 1 tablet (20 mg total) by mouth daily as needed for fluid or edema. Patient taking differently: Take 20 mg by mouth daily.  04/20/20  Yes [name], [name], MD  metoprolol tartrate (LOPRESSOR) 25 MG tablet Take 1.5 tablets (37.5 mg total) by mouth 2 (two) times daily. 04/14/20  Yes [name], [name], MD  Misc Natural Products (PROSTATE SUPPORT) 300-15 MG TABS Take 1 tablet by mouth daily. Super Beta Prostate    Yes [provider]  Multiple Vitamin (MULTIVITAMIN WITH MINERALS) TABS tablet Take 1 tablet by mouth daily.   Yes [provider]    nystatin (MYCOSTATIN/NYSTOP) powder Apply 1 application topically 3 (three) times daily. Patient not taking: Reported on 04/29/2020 04/14/20   [name], [name], MD  nystatin cream (MYCOSTATIN) Apply 1 application topically 2 (two) times daily. Patient not taking: Reported on 04/29/2020 04/14/20   [name], [name], MD    Inpatient Medications: Scheduled Meds:  apixaban  5 mg Oral BID   aspirin EC  81 mg Oral Daily   atorvastatin  80 mg Oral Daily   Chlorhexidine Gluconate Cloth  6 each Topical Q0600   clopidogrel  75 mg Oral Q breakfast   [START ON 05/03/2020] furosemide  40 mg Oral Daily   insulin aspart  0-20 Units Subcutaneous TID WC   metoprolol tartrate  37.5 mg Oral BID   multivitamin with minerals  1 tablet Oral Daily   mupirocin ointment  1 application Nasal BID   pantoprazole  40 mg Oral Daily   sodium chloride flush  3 mL Intravenous Q12H   Continuous Infusions:  sodium chloride     PRN Meds: sodium chloride, acetaminophen, nitroGLYCERIN, ondansetron (ZOFRAN) IV, sodium chloride flush  Allergies:    Allergies  Allergen Reactions   Rofecoxib Other (See Comments)    (Vioxx)    Social History:   Social History   Socioeconomic History   Marital status: Married    Spouse name: Ryan Garner   Number of children: 2   Years of education: 20   Highest education level: Doctorate  Occupational History   Occupation: Theme park manager    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  Tobacco Use   Smoking status: Former Smoker    Types: Cigarettes    Quit date: 04/02/1959    Years since quitting: 61.1   Smokeless tobacco: Never Used  Scientific laboratory technician Use: Never used  Substance and Sexual Activity   Alcohol use: No   Drug use: No   Sexual activity: Yes  Other Topics Concern   Not on file  Social History Narrative   Not on file   Social Determinants of Health   Financial Resource Strain:    Difficulty of Paying Living  Expenses: Not on file  Food Insecurity:    Worried About Charity fundraiser in the Last Year: Not on file   [place] in the Last Year: Not on file  Transportation Needs:    Lack of Transportation (Medical): Not on file   Lack of Transportation (Non-Medical): Not on file  Physical Activity:    Days of Exercise per Week: Not on file   Minutes of Exercise per Session: Not on file  Stress: Stress Concern Present   Feeling of Stress : To some extent  Social Connections:    Frequency of Communication with Friends and Family: Not on file   Frequency of Social  Gatherings with Friends and Family: Not on file   Attends Religious Services: Not on file   Active Member of Clubs or Organizations: Not on file   Attends Archivist Meetings: Not on file   Marital Status: Not on file  Intimate Partner Violence:    Fear of Current or Ex-Partner: Not on file   Emotionally Abused: Not on file   Physically Abused: Not on file   Sexually Abused: Not on file    Family History:   Family History  Problem Relation Age of Onset   Cancer Mother        lungs   Coronary artery disease Mother    Diabetes Brother    Heart attack Brother    Diabetes Maternal Grandmother    Arthritis Father      ROS:  Please see the history of present illness.  All other ROS reviewed and negative.     Physical Exam/Data:   Vitals:   05/01/20 2036 05/02/20 0516 05/02/20 0824 05/02/20 0842  BP: 115/77 117/86 112/78   Pulse: 84 71 67 76  Resp: 18 18    Temp: 98.8 F (37.1 C) 98.6 F (37 C) 98 F (36.7 C)   TempSrc: Oral Oral Oral   SpO2: 100% 100% 99%   Weight:  (!) 137 kg      Intake/Output Summary (Last 24 hours) at 05/02/2020 0943 Last data filed at 05/02/2020 0845 Gross per 24 hour  Intake 453 ml  Output 2850 ml  Net -2397 ml   Last 3 Weights 05/02/2020 04/21/2020 04/14/2020  Weight (lbs) 302 lb 310 lb 3.2 oz 309 lb  Weight (kg) 136.986 kg 140.706 kg 140.161 kg     Body mass index is  45.92 kg/Garner.  General:  Well nourished, well developed, in no acute distress HEENT: normal Lymph: no adenopathy Neck: no JVD Endocrine:  No thryomegaly Vascular: No carotid bruits Cardiac:  irreg-irreg; no murmurs, gallops or rubs Lungs:  CTA, no wheezing, rhonchi or rales  Abd: soft, nontender, no hepatomegaly  Ext:  Marked edema Musculoskeletal:  No deformities Skin: warm and dry  Neuro:  no focal abnormalities noted Psych:  Normal affect   EKG:  The EKG was personally reviewed and demonstrates:    #1 AFib 43 #2 course AFib 74 #3 Afib 76bpm  Telemetry:  Telemetry was personally reviewed and demonstrates:   AFib 60's-90's, mostly 70's  Relevant CV Studies:  05/01/20: TTE IMPRESSIONS  1. Anterior and anterolateral hypokinesis. Left ventricular ejection  fraction, by estimation, is 60 to 65%. The left ventricle has normal  function. The left ventricle has no regional wall motion abnormalities.  Left ventricular diastolic parameters are  indeterminate.   2. Right ventricular systolic function is normal. The right ventricular  size is normal. There is severely elevated pulmonary artery systolic  pressure.   3. Left atrial size was severely dilated.   4. The mitral valve is normal in structure. Mild to moderate mitral valve  regurgitation. No evidence of mitral stenosis.   5. The aortic valve is calcified. There is moderate calcification of the  aortic valve. There is moderate thickening of the aortic valve. Aortic  valve regurgitation is mild. No aortic stenosis is present. Aortic valve  area, by VTI measures 1.04 cm.  Aortic valve mean gradient measures 15.2 mmHg. Aortic valve Vmax measures  2.70 Garner/s.   6. Aortic dilatation noted. There is mild dilatation of the ascending  aorta, measuring 38 mm.   7. The inferior  vena cava is dilated in size with >50% respiratory  variability, suggesting right atrial pressure of 8 mmHg.  FINDINGS   Left Ventricle: Anterior and  anterolateral hypokinesis. Left ventricular  ejection fraction, by estimation, is 60 to 65%. The left ventricle has  normal function. The left ventricle has no regional wall motion  abnormalities. The left ventricular internal  cavity size was normal in size. There is no left ventricular hypertrophy.  Left ventricular diastolic parameters are indeterminate.   Right Ventricle: The right ventricular size is normal. No increase in  right ventricular wall thickness. Right ventricular systolic function is  normal. There is severely elevated pulmonary artery systolic pressure. The  tricuspid regurgitant velocity is  3.79 Garner/s, and with an assumed right atrial pressure of 8 mmHg, the  estimated right ventricular systolic pressure is 00.8 mmHg.   Left Atrium: Left atrial size was severely dilated. (85mm)  Right Atrium: Right atrial size was normal in size.   Pericardium: There is no evidence of pericardial effusion.   Mitral Valve: The mitral valve is normal in structure. Mild to moderate  mitral valve regurgitation. No evidence of mitral valve stenosis.   Tricuspid Valve: The tricuspid valve is normal in structure. Tricuspid  valve regurgitation is mild . No evidence of tricuspid stenosis.   Aortic Valve: The aortic valve is calcified. There is moderate  calcification of the aortic valve. There is moderate thickening of the  aortic valve. Aortic valve regurgitation is mild. No aortic stenosis is  present. Aortic valve mean gradient measures  15.2 mmHg. Aortic valve peak gradient measures 29.2 mmHg. Aortic valve  area, by VTI measures 1.04 cm.   Pulmonic Valve: The pulmonic valve was normal in structure. Pulmonic valve  regurgitation is not visualized. No evidence of pulmonic stenosis.   Aorta: Aortic dilatation noted. There is mild dilatation of the ascending  aorta, measuring 38 mm.   Venous: The inferior vena cava is dilated in size with greater than 50%  respiratory variability,  suggesting right atrial pressure of 8 mmHg.   IAS/Shunts: No atrial level shunt detected by color flow Doppler.    04/30/2020; LHC/PCI Ramus lesion is 75% stenosed. Mid Cx lesion is 99% stenosed. Post intervention, there is a 0% residual stenosis. A drug-eluting stent was successfully placed using a STENT RESOLUTE ONYX 2.5X34. The left ventricular systolic function is normal. LV end diastolic pressure is mildly elevated. The left ventricular ejection fraction is 55-65% by visual estimate.   1. 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 Good LV systolic function 3. Elevated LVEDP 4. Successful PCI of the mid to distal LCx with DES x 1.   Plan: ASA 81 mg daily for one month. Plavix 75 mg daily for one year. May resume Eliquis tomorrow. IV diuresis. Will need to stop Flecainide. Consider alternative AAD therapy.    Laboratory Data:  High Sensitivity Troponin:   Recent Labs  Lab 04/30/20 1130 04/30/20 1620  TROPONINIHS 40* 1,352*     Chemistry Recent Labs  Lab 04/29/20 1246 04/29/20 1246 04/30/20 1130 04/30/20 1620 05/01/20 1759  NA 139  --  139  --  138  K 4.1  --  3.9  --  3.8  CL 103  --  106  --  99  CO2 21  --  21*  --  29  GLUCOSE 149*  --  133*  --  112*  BUN 25  --  22  --  16  CREATININE 1.24   < >  1.09 1.08 1.10  CALCIUM 9.4  --  9.0  --  9.2  GFRNONAA 56*  --  >60 >60 >60  GFRAA 65  --   --   --   --   ANIONGAP  --   --  12  --  10   < > = values in this interval not displayed.    Recent Labs  Lab 04/30/20 1130  PROT 6.0*  ALBUMIN 3.3*  AST 19  ALT 31  ALKPHOS 61  BILITOT 0.5   Hematology Recent Labs  Lab 04/30/20 1130 04/30/20 1620 05/01/20 1759  WBC 11.1* 10.5 11.6*  RBC 4.55 4.62 4.70  HGB 13.0 13.3 13.5  HCT 40.8 42.0 42.6  MCV 89.7 90.9 90.6  MCH 28.6 28.8 28.7  MCHC 31.9 31.7 31.7  RDW 13.9 14.0 13.8  PLT 286 261 283   BNPNo results for input(s): BNP, PROBNP in the last 168 hours.  DDimer No results for  input(s): DDIMER in the last 168 hours.   Radiology/Studies:     Assessment and Plan:   1. Persistent AFib     CHA2DS2Vasc is 4, outpt on eliquis.  Agree with stopping Flecainide. He was >24 hrs here without anticoagulation, (all day yesterday) planned to resume today Rhythm control not an acute option without TEE, he is rate controlled  AAD options QT may be borderline for Tikosyn/sotalol Amiodarone  I will review EKGs/QT with Dr. 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 He is very edematous, and getting lasix here {  For questions or updates, please contact [place] Please consult [email or web address] for contact info under    Signed, Baldwin Jamaica, PA-C  05/02/2020 9:43 AM  EP attending  Patient seen and examined.  He is well-known to me from a prior clinic visit a couple of weeks ago.  The patient is a morbidly obese man with a history of gallbladder difficulties and relatively new onset atrial fibrillation.  When I saw him in the office, I placed him on flecainide.  He did not have any history of coronary disease the time.  He has presented the hospital with a STEMI and is undergoing treatment.  His flecainide has been discontinued.  His ventricular rate is controlled in atrial fibrillation.  The patient has a history of noncompliance with dietary therapy.  The patient feels better but does not have palpitations.  He did not know he was out of rhythm.  His QT in the office was prolonged making dofetilide a poor idea.  The only antiarrhythmic drug at this point would likely be amiodarone.  His obesity makes him a poor candidate for amiodarone.   For now would suggest a strategy of rate control and weight loss.  I discussed all the valve with the patient.  No additional recommendations at this time.  Cristopher Peru, MD

## 2020-05-02 NOTE — Progress Notes (Signed)
CARDIAC REHAB PHASE I   PRE:  Rate/Rhythm: 75 afib  BP:  Supine:   Sitting: 112/78  Standing:    SaO2: 99% 2L  96%RA  MODE:  Ambulation: 140 ft 76ft x 2  POST:  Rate/Rhythm: 108 afib  BP:  Supine:   Sitting: 108/89  Standing:    SaO2: 92-94%RA [id number] Pt walked 140 ft on RA with slow steady gait. Stopped at 70 ft to rest. No CP.  Sats monitored during walk and maintained above 90%. Put on 2L in room for comfort. Legs swollen and pt has them elevated when sitting. Discussed NTG use, importance of plavix with stent, MI restrictions, walking as tolerated, low sodium and low carb foods, and CRP 2. Discussed weighing daily and adhering to 2000 mg sodium restriction. Gave pt OFF the BEAT booklet and briefly discussed atrial fib. Referring to [place] CRP 2.    Graylon Good, RN BSN  05/02/2020 9:18 AM

## 2020-05-02 NOTE — Progress Notes (Addendum)
Progress Note  Patient Name: Ryan Garner Date of Encounter: 05/02/2020  [facility] Cardiologist: Dorris Carnes, MD   Subjective   Walked in the hallway with CR and did reasonably well. No chest pain, mild shortness of breath.   Inpatient Medications    Scheduled Meds: . apixaban  5 mg Oral BID  . aspirin EC  81 mg Oral Daily  . atorvastatin  80 mg Oral Daily  . Chlorhexidine Gluconate Cloth  6 each Topical Q0600  . clopidogrel  75 mg Oral Q breakfast  . furosemide  40 mg Intravenous Q12H  . insulin aspart  0-20 Units Subcutaneous TID WC  . metoprolol tartrate  37.5 mg Oral BID  . multivitamin with minerals  1 tablet Oral Daily  . mupirocin ointment  1 application Nasal BID  . pantoprazole  40 mg Oral Daily  . sodium chloride flush  3 mL Intravenous Q12H   Continuous Infusions: . sodium chloride     PRN Meds: sodium chloride, acetaminophen, nitroGLYCERIN, ondansetron (ZOFRAN) IV, sodium chloride flush   Vital Signs    Vitals:   05/01/20 2036 05/02/20 0516 05/02/20 0824 05/02/20 0842  BP: 115/77 117/86 112/78   Pulse: 84 71 67 76  Resp: 18 18    Temp: 98.8 F (37.1 C) 98.6 F (37 C) 98 F (36.7 C)   TempSrc: Oral Oral Oral   SpO2: 100% 100% 99%   Weight:  (!) 137 kg      Intake/Output Summary (Last 24 hours) at 05/02/2020 0850 Last data filed at 05/02/2020 0845 Gross per 24 hour  Intake 453 ml  Output 3725 ml  Net -3272 ml   Last 3 Weights 05/02/2020 04/21/2020 04/14/2020  Weight (lbs) 302 lb 310 lb 3.2 oz 309 lb  Weight (kg) 136.986 kg 140.706 kg 140.161 kg      Telemetry    Afib rate controlled 80s - Personally Reviewed  ECG    No new tracing.  Physical Exam  Pleasant obese older WM, sitting up in chair GEN: No acute distress.   Neck: No JVD Cardiac: Irreg Irreg, no murmurs, rubs, or gallops.  Respiratory: Clear to auscultation bilaterally. GI: Soft, nontender, non-distended  MS: No edema; No deformity. Right radial site with ecchymosis,  but no hematoma. Bilateral LE edema. Neuro:  Nonfocal  Psych: Normal affect   Labs    High Sensitivity Troponin:   Recent Labs  Lab 04/30/20 1130 04/30/20 1620  TROPONINIHS 40* 1,352*      Chemistry Recent Labs  Lab 04/29/20 1246 04/29/20 1246 04/30/20 1130 04/30/20 1620 05/01/20 1759  NA 139  --  139  --  138  K 4.1  --  3.9  --  3.8  CL 103  --  106  --  99  CO2 21  --  21*  --  29  GLUCOSE 149*  --  133*  --  112*  BUN 25  --  22  --  16  CREATININE 1.24   < > 1.09 1.08 1.10  CALCIUM 9.4  --  9.0  --  9.2  PROT  --   --  6.0*  --   --   ALBUMIN  --   --  3.3*  --   --   AST  --   --  19  --   --   ALT  --   --  31  --   --   ALKPHOS  --   --  61  --   --  BILITOT  --   --  0.5  --   --   GFRNONAA 56*  --  >60 >60 >60  GFRAA 65  --   --   --   --   ANIONGAP  --   --  12  --  10   < > = values in this interval not displayed.     Hematology Recent Labs  Lab 04/30/20 1130 04/30/20 1620 05/01/20 1759  WBC 11.1* 10.5 11.6*  RBC 4.55 4.62 4.70  HGB 13.0 13.3 13.5  HCT 40.8 42.0 42.6  MCV 89.7 90.9 90.6  MCH 28.6 28.8 28.7  MCHC 31.9 31.7 31.7  RDW 13.9 14.0 13.8  PLT 286 261 283    BNPNo results for input(s): BNP, PROBNP in the last 168 hours.   DDimer No results for input(s): DDIMER in the last 168 hours.   Radiology    CARDIAC CATHETERIZATION  Result Date: 04/30/2020  Ramus lesion is 75% stenosed.  Mid Cx lesion is 99% stenosed.  Post intervention, there is a 0% residual stenosis.  A drug-eluting stent was successfully placed using a STENT RESOLUTE ONYX 2.5X34.  The left ventricular systolic function is normal.  LV end diastolic pressure is mildly elevated.  The left ventricular ejection fraction is 55-65% by visual estimate.  1. 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 Good LV systolic function 3. Elevated LVEDP 4. Successful PCI of the mid to distal LCx with DES x 1. Plan: ASA 81 mg daily for one month. Plavix 75 mg  daily for one year. May resume Eliquis tomorrow. IV diuresis. Will need to stop Flecainide. Consider alternative AAD therapy.   ECHOCARDIOGRAM COMPLETE  Result Date: 05/01/2020    ECHOCARDIOGRAM REPORT   Patient Name:   [name] Date of Exam: 05/01/2020 Medical Rec #:  [id number]    Height:       68.0 in Accession #:    [id number]   Weight:       310.2 lb Date of Birth:  1945/12/20     BSA:          2.463 m Patient Age:    74 years     BP:           92/73 mmHg Patient Gender: M            HR:           81 bpm. Exam Location:  Inpatient Procedure: 2D Echo Indications:    STEMI  History:        Patient has prior history of Echocardiogram examinations, most                 recent 03/01/2020. Aortic Valve Disease, Arrythmias:Atrial                 Fibrillation; Risk Factors:Hypertension, Diabetes and                 Dyslipidemia.  Sonographer:    Johny Chess Referring Phys: [id number] MATTHEW A CARLISLE  Sonographer Comments: Image acquisition challenging due to patient body habitus. IMPRESSIONS  1. Anterior and anterolateral hypokinesis. Left ventricular ejection fraction, by estimation, is 60 to 65%. The left ventricle has normal function. The left ventricle has no regional wall motion abnormalities. Left ventricular diastolic parameters are indeterminate.  2. Right ventricular systolic function is normal. The right ventricular size is normal. There is severely elevated pulmonary artery systolic pressure.  3. Left atrial size was severely dilated.  4. The mitral valve is normal in structure. Mild to moderate mitral valve regurgitation. No evidence of mitral stenosis.  5. The aortic valve is calcified. There is moderate calcification of the aortic valve. There is moderate thickening of the aortic valve. Aortic valve regurgitation is mild. No aortic stenosis is present. Aortic valve area, by VTI measures 1.04 cm. Aortic valve mean gradient measures 15.2 mmHg. Aortic valve Vmax measures 2.70 m/s.  6. Aortic  dilatation noted. There is mild dilatation of the ascending aorta, measuring 38 mm.  7. The inferior vena cava is dilated in size with >50% respiratory variability, suggesting right atrial pressure of 8 mmHg. FINDINGS  Left Ventricle: Anterior and anterolateral hypokinesis. Left ventricular ejection fraction, by estimation, is 60 to 65%. The left ventricle has normal function. The left ventricle has no regional wall motion abnormalities. The left ventricular internal cavity size was normal in size. There is no left ventricular hypertrophy. Left ventricular diastolic parameters are indeterminate. Right Ventricle: The right ventricular size is normal. No increase in right ventricular wall thickness. Right ventricular systolic function is normal. There is severely elevated pulmonary artery systolic pressure. The tricuspid regurgitant velocity is 3.79 m/s, and with an assumed right atrial pressure of 8 mmHg, the estimated right ventricular systolic pressure is 62.6 mmHg. Left Atrium: Left atrial size was severely dilated. Right Atrium: Right atrial size was normal in size. Pericardium: There is no evidence of pericardial effusion. Mitral Valve: The mitral valve is normal in structure. Mild to moderate mitral valve regurgitation. No evidence of mitral valve stenosis. Tricuspid Valve: The tricuspid valve is normal in structure. Tricuspid valve regurgitation is mild . No evidence of tricuspid stenosis. Aortic Valve: The aortic valve is calcified. There is moderate calcification of the aortic valve. There is moderate thickening of the aortic valve. Aortic valve regurgitation is mild. No aortic stenosis is present. Aortic valve mean gradient measures 15.2 mmHg. Aortic valve peak gradient measures 29.2 mmHg. Aortic valve area, by VTI measures 1.04 cm. Pulmonic Valve: The pulmonic valve was normal in structure. Pulmonic valve regurgitation is not visualized. No evidence of pulmonic stenosis. Aorta: Aortic dilatation noted.  There is mild dilatation of the ascending aorta, measuring 38 mm. Venous: The inferior vena cava is dilated in size with greater than 50% respiratory variability, suggesting right atrial pressure of 8 mmHg. IAS/Shunts: No atrial level shunt detected by color flow Doppler.  LEFT VENTRICLE PLAX 2D LVIDd:         4.80 cm LVIDs:         2.80 cm LV PW:         1.00 cm LV IVS:        1.00 cm LVOT diam:     2.10 cm LV SV:         53 LV SV Index:   21 LVOT Area:     3.46 cm  IVC IVC diam: 2.40 cm LEFT ATRIUM              Index       RIGHT ATRIUM           Index LA diam:        5.10 cm  2.07 cm/m  RA Area:     19.50 cm LA Vol (A2C):   86.9 ml  35.28 ml/m RA Volume:   56.00 ml  22.74 ml/m LA Vol (A4C):   101.0 ml 41.01 ml/m LA Biplane Vol: 100.0 ml 40.60 ml/m  AORTIC VALVE AV Area (Vmax):  1.06 cm AV Area (Vmean):   1.07 cm AV Area (VTI):     1.04 cm AV Vmax:           270.00 cm/s AV Vmean:          181.750 cm/s AV VTI:            0.503 m AV Peak Grad:      29.2 mmHg AV Mean Grad:      15.2 mmHg LVOT Vmax:         82.57 cm/s LVOT Vmean:        56.367 cm/s LVOT VTI:          0.152 m LVOT/AV VTI ratio: 0.30  AORTA Ao Root diam: 3.40 cm Ao Asc diam:  3.80 cm TRICUSPID VALVE TR Peak grad:   57.5 mmHg TR Vmax:        379.00 cm/s  SHUNTS Systemic VTI:  0.15 m Systemic Diam: 2.10 cm Skeet Latch MD Electronically signed by Skeet Latch MD Signature Date/Time: 05/01/2020/1:01:17 PM    Final     Cardiac Studies   Cath: 04/30/20   Ramus lesion is 75% stenosed.  Mid Cx lesion is 99% stenosed.  Post intervention, there is a 0% residual stenosis.  A drug-eluting stent was successfully placed using a STENT RESOLUTE ONYX 2.5X34.  The left ventricular systolic function is normal.  LV end diastolic pressure is mildly elevated.  The left ventricular ejection fraction is 55-65% by visual estimate.   1. 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 Good LV systolic function 3.  Elevated LVEDP 4. Successful PCI of the mid to distal LCx with DES x 1.  Plan: ASA 81 mg daily for one month. Plavix 75 mg daily for one year. May resume Eliquis tomorrow. IV diuresis. Will need to stop Flecainide. Consider alternative AAD therapy.  Diagnostic Dominance: Right  Intervention   Echo: 05/01/20  IMPRESSIONS    1. Anterior and anterolateral hypokinesis. Left ventricular ejection  fraction, by estimation, is 60 to 65%. The left ventricle has normal  function. The left ventricle has no regional wall motion abnormalities.  Left ventricular diastolic parameters are  indeterminate.  2. Right ventricular systolic function is normal. The right ventricular  size is normal. There is severely elevated pulmonary artery systolic  pressure.  3. Left atrial size was severely dilated.  4. The mitral valve is normal in structure. Mild to moderate mitral valve  regurgitation. No evidence of mitral stenosis.  5. The aortic valve is calcified. There is moderate calcification of the  aortic valve. There is moderate thickening of the aortic valve. Aortic  valve regurgitation is mild. No aortic stenosis is present. Aortic valve  area, by VTI measures 1.04 cm.  Aortic valve mean gradient measures 15.2 mmHg. Aortic valve Vmax measures  2.70 m/s.  6. Aortic dilatation noted. There is mild dilatation of the ascending  aorta, measuring 38 mm.  7. The inferior vena cava is dilated in size with >50% respiratory  variability, suggesting right atrial pressure of 8 mmHg.   Patient Profile     74 y.o. male with PMH of persistent Afib, HLD, HTN, morbid obesity, AKI, hypokalemia, hyponatremia, and prediabetes who presented with acute STEMI.   Assessment & Plan    1. STEMI: underwent cardiac cath noted above with successful PCI/DESx1 to the dLCx. Residual disease 75% in ramus to be treated medically. Normal LV function, with LVEDP of 29. hsTn peaked at 1352. Placed on DAPT  with  ASA/plavix, will need triple therapy with ASA/plavix and Eliquis then stop ASA at one month.  -- worked well with cardiac rehab -- continue statin, BB  2. Persistent Afib: Seen by Dr. Lovena Le on 9/30 and started on Flecainide with plans for outpatient DCCV.  -- flecainide stopped on admission -- Eliquis restarted today -- remains in Afib though rate controlled. Still complains of dyspnea with minimal activity. Will ask for EP consult regarding AAD going forward  3. HTN: stable with current metoprolol dose  4. HLD: LDL 78. Started on high dose atorvastatin 80mg   -- will need FLP/LFTs in 8 weeks  5. LE edema: was doing lasix 20mg  PRN prior to admission. Net -4.5L this admission.  -- transition to Lasix 40mg  PO daily]  6. Morbid Obesity: Needs to continue with lifestyle modification  For questions or updates, please contact [place] Please consult [email or web address] for contact info under        Signed, Reino Bellis, NP  05/02/2020, 8:50 AM    I have personally seen and examined this patient. I agree with the assessment and plan as outlined above.  He is doing well this am. Will plan to resume Eliquis today. Will continue ASA and Plavix for one month post PCI then stop ASA.  His flecainide is now stopped. He remains in atrial fib. Will ask our EP team to see him to comment on additional medical therapy Likely discharge home today.   Lauree Chandler 05/02/2020 10:08 AM

## 2020-05-02 NOTE — Discharge Summary (Signed)
Discharge Summary    Patient ID: Ryan Garner MRN: [id number]; DOB: 1946-04-21  Admit date: 04/30/2020 Discharge date: 05/02/2020  Primary Care Provider: [name], [name], MD  Primary Cardiologist: Dorris Carnes, MD  Primary Electrophysiologist:  None   Discharge Diagnoses    Principal Problem:   Acute ST elevation myocardial infarction (STEMI) involving left circumflex coronary artery [facility]) Active Problems:   Hypertension   Hyperlipidemia   Type 2 diabetes mellitus ([place])   Morbid obesity ([place])   Atrial fibrillation ([place])   STEMI involving left circumflex coronary artery ([place])   STEMI (ST elevation myocardial infarction) [facility])   Diagnostic Studies/Procedures    Cath: 04/30/20   Ramus lesion is 75% stenosed.  Mid Cx lesion is 99% stenosed.  Post intervention, there is a 0% residual stenosis.  A drug-eluting stent was successfully placed using a STENT RESOLUTE ONYX 2.5X34.  The left ventricular systolic function is normal.  LV end diastolic pressure is mildly elevated.  The left ventricular ejection fraction is 55-65% by visual estimate.  1. 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 Good LV systolic function 3. Elevated LVEDP 4. Successful PCI of the mid to distal LCx with DES x 1.  Plan: ASA 81 mg daily for one month. Plavix 75 mg daily for one year. May resume Eliquis tomorrow. IV diuresis. Will need to stop Flecainide. Consider alternative AAD therapy.  Diagnostic Dominance: Right  Intervention   Echo: 05/01/20  IMPRESSIONS    1. Anterior and anterolateral hypokinesis. Left ventricular ejection  fraction, by estimation, is 60 to 65%. The left ventricle has normal  function. The left ventricle has no regional wall motion abnormalities.  Left ventricular diastolic parameters are  indeterminate.  2. Right ventricular systolic function is normal. The right ventricular  size is normal. There is severely elevated pulmonary  artery systolic  pressure.  3. Left atrial size was severely dilated.  4. The mitral valve is normal in structure. Mild to moderate mitral valve  regurgitation. No evidence of mitral stenosis.  5. The aortic valve is calcified. There is moderate calcification of the  aortic valve. There is moderate thickening of the aortic valve. Aortic  valve regurgitation is mild. No aortic stenosis is present. Aortic valve  area, by VTI measures 1.04 cm.  Aortic valve mean gradient measures 15.2 mmHg. Aortic valve Vmax measures  2.70 m/s.  6. Aortic dilatation noted. There is mild dilatation of the ascending  aorta, measuring 38 mm.  7. The inferior vena cava is dilated in size with >50% respiratory  variability, suggesting right atrial pressure of 8 mmHg.   _____________   History of Present Illness     Ryan Garner is a 74 y.o. male with a history of recent new onset atrial fibrillation, hyperlipidemia, hypertension, morbid obesity, AKI, hypokalemia, hyponatremia, prediabetes who presented to [facility] on 04/30/2020 after recent onset of chest pain.  He stated that several days prior to admission he had one episode of chest pain with radiation to his left arm at which time he took one of his wife's sublingual nitroglycerin tablets with relief.  Pain occurred once again earlier the following morning however was more pronounced.  He took 2 sublingual nitroglycerin tablets without relief therefore he called EMS for further evaluation.  On EMS arrival, EKG consistent with lateral STEMI therefore he was brought emergently to the cardiac Cath Lab.  He was recently seen 04/04/2020 after a recent hospitalization 02/2020 for abdominal pain at which time he was diagnosed  with acute cholecystitis and underwent laparoscopic cholecystectomy.  He was noted to be in new onset atrial fibrillation with RVR as an incidental finding.  CHA2DS2-VASc score found to be 4 therefore he was started on heparin infusion which was  held for surgery.  Cardiology was consulted and recommended follow-up at the atrial fibrillation clinic and he was placed on Eliquis prior to discharge.  At follow-up, he had complaints of exertional shortness of breath and heart rate was found to be elevated therefore metoprolol was increased to 25 mg p.o. twice daily with plans for possible DCCV.  He was then seen by Dr. Lovena Le 04/21/2020 with recommendations to start flecainide and undergo DCCV given symptomatic AF.   Hospital Course     Consultants: EP   1. STEMI: underwent cardiac cath noted above with successful PCI/DESx1 to the dLCx. Residual disease 75% in ramus to be treated medically. Normal LV function, with LVEDP of 29. hsTn peaked at 1352. Placed on DAPT with ASA/plavix, will need triple therapy with ASA/plavix and Eliquis then stop ASA at one month.  -- worked well with cardiac rehab -- continue statin, BB  2. Persistent Afib: Seen by Dr. Lovena Le on 9/30 and started on Flecainide with plans for outpatient DCCV.  -- flecainide stopped on admission -- Eliquis restarted the day of discharge. -- remains in Afib though rate controlled. See by Dr. Lovena Le with recommendation for rate control and weight loss.   3. HTN: stable with current metoprolol dose  4. HLD: LDL 78. Started on high dose atorvastatin 80mg   -- will need FLP/LFTs in 8 weeks  5. LE edema: was doing lasix 20mg  PRN prior to admission. Net - 4.5L this admission with IV lasix.   -- transition to Lasix 40mg  PO daily at discharge -- instructed on daily weights and low Na+ diet at home  6. Morbid Obesity: Needs to continue with lifestyle modification  7. Prediabetic: Hgb A1c 6.2 -- follow up management with PCP  Did the patient have an acute coronary syndrome (MI, NSTEMI, STEMI, etc) this admission?:  Yes                               AHA/ACC Clinical Performance & Quality Measures: 1. Aspirin prescribed? - Yes 2. ADP Receptor Inhibitor (Plavix/Clopidogrel,  Brilinta/Ticagrelor or Effient/Prasugrel) prescribed (includes medically managed patients)? - Yes 3. Beta Blocker prescribed? - Yes 4. High Intensity Statin (Lipitor 40-80mg  or Crestor 20-40mg ) prescribed? - Yes 5. EF assessed during THIS hospitalization? - Yes 6. For EF <40%, was ACEI/ARB prescribed? - Not Applicable (EF >/= 13%) 7. For EF <40%, Aldosterone Antagonist (Spironolactone or Eplerenone) prescribed? - Not Applicable (EF >/= 08%) 8. Cardiac Rehab Phase II ordered (including medically managed patients)? - Yes   _____________  Discharge Vitals Blood pressure 115/73, pulse 93, temperature 98.6 F (37 C), temperature source Oral, resp. rate 18, height 5\' 8"  (1.727 m), weight (!) 137 kg, SpO2 94 %.  Filed Weights   05/02/20 0516 05/02/20 1431  Weight: (!) 137 kg (!) 137 kg    Labs & Radiologic Studies    CBC Recent Labs    04/30/20 1620 05/01/20 1759  WBC 10.5 11.6*  HGB 13.3 13.5  HCT 42.0 42.6  MCV 90.9 90.6  PLT 261 657   Basic Metabolic Panel Recent Labs    05/01/20 1759 05/02/20 0859  NA 138 135  K 3.8 3.6  CL 99 96*  CO2 29 28  GLUCOSE 112* 155*  BUN 16 16  CREATININE 1.10 1.10  CALCIUM 9.2 9.3   Liver Function Tests Recent Labs    04/30/20 1130  AST 19  ALT 31  ALKPHOS 61  BILITOT 0.5  PROT 6.0*  ALBUMIN 3.3*   No results for input(s): LIPASE, AMYLASE in the last 72 hours. High Sensitivity Troponin:   Recent Labs  Lab 04/30/20 1130 04/30/20 1620  TROPONINIHS 40* 1,352*    BNP Invalid input(s): POCBNP D-Dimer No results for input(s): DDIMER in the last 72 hours. Hemoglobin A1C Recent Labs    04/30/20 1130  HGBA1C 6.2*   Fasting Lipid Panel Recent Labs    05/01/20 1759  CHOL 122  HDL 30*  LDLCALC 79  TRIG 63  CHOLHDL 4.1   Thyroid Function Tests No results for input(s): TSH, T4TOTAL, T3FREE, THYROIDAB in the last 72 hours.  Invalid input(s): FREET3 _____________  CARDIAC CATHETERIZATION  Result Date: 04/30/2020   Ramus lesion is 75% stenosed.  Mid Cx lesion is 99% stenosed.  Post intervention, there is a 0% residual stenosis.  A drug-eluting stent was successfully placed using a STENT RESOLUTE ONYX 2.5X34.  The left ventricular systolic function is normal.  LV end diastolic pressure is mildly elevated.  The left ventricular ejection fraction is 55-65% by visual estimate.  1. 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 Good LV systolic function 3. Elevated LVEDP 4. Successful PCI of the mid to distal LCx with DES x 1. Plan: ASA 81 mg daily for one month. Plavix 75 mg daily for one year. May resume Eliquis tomorrow. IV diuresis. Will need to stop Flecainide. Consider alternative AAD therapy.   ECHOCARDIOGRAM COMPLETE  Result Date: 05/01/2020    ECHOCARDIOGRAM REPORT   Patient Name:   SUTTON HIRSCH Date of Exam: 05/01/2020 Medical Rec #:  [id number]    Height:       68.0 in Accession #:    [id number]   Weight:       310.2 lb Date of Birth:  [birth year]     BSA:          2.463 m Patient Age:    28 years     BP:           92/73 mmHg Patient Gender: M            HR:           81 bpm. Exam Location:  Inpatient Procedure: 2D Echo Indications:    STEMI  History:        Patient has prior history of Echocardiogram examinations, most                 recent 03/01/2020. Aortic Valve Disease, Arrythmias:Atrial                 Fibrillation; Risk Factors:Hypertension, Diabetes and                 Dyslipidemia.  Sonographer:    Johny Chess Referring Phys: [id number] MATTHEW A CARLISLE  Sonographer Comments: Image acquisition challenging due to patient body habitus. IMPRESSIONS  1. Anterior and anterolateral hypokinesis. Left ventricular ejection fraction, by estimation, is 60 to 65%. The left ventricle has normal function. The left ventricle has no regional wall motion abnormalities. Left ventricular diastolic parameters are indeterminate.  2. Right ventricular systolic function is normal. The right ventricular  size is normal. There is severely elevated pulmonary artery systolic pressure.  3. Left atrial size was severely  dilated.  4. The mitral valve is normal in structure. Mild to moderate mitral valve regurgitation. No evidence of mitral stenosis.  5. The aortic valve is calcified. There is moderate calcification of the aortic valve. There is moderate thickening of the aortic valve. Aortic valve regurgitation is mild. No aortic stenosis is present. Aortic valve area, by VTI measures 1.04 cm. Aortic valve mean gradient measures 15.2 mmHg. Aortic valve Vmax measures 2.70 m/s.  6. Aortic dilatation noted. There is mild dilatation of the ascending aorta, measuring 38 mm.  7. The inferior vena cava is dilated in size with >50% respiratory variability, suggesting right atrial pressure of 8 mmHg. FINDINGS  Left Ventricle: Anterior and anterolateral hypokinesis. Left ventricular ejection fraction, by estimation, is 60 to 65%. The left ventricle has normal function. The left ventricle has no regional wall motion abnormalities. The left ventricular internal cavity size was normal in size. There is no left ventricular hypertrophy. Left ventricular diastolic parameters are indeterminate. Right Ventricle: The right ventricular size is normal. No increase in right ventricular wall thickness. Right ventricular systolic function is normal. There is severely elevated pulmonary artery systolic pressure. The tricuspid regurgitant velocity is 3.79 m/s, and with an assumed right atrial pressure of 8 mmHg, the estimated right ventricular systolic pressure is 37.1 mmHg. Left Atrium: Left atrial size was severely dilated. Right Atrium: Right atrial size was normal in size. Pericardium: There is no evidence of pericardial effusion. Mitral Valve: The mitral valve is normal in structure. Mild to moderate mitral valve regurgitation. No evidence of mitral valve stenosis. Tricuspid Valve: The tricuspid valve is normal in structure. Tricuspid valve  regurgitation is mild . No evidence of tricuspid stenosis. Aortic Valve: The aortic valve is calcified. There is moderate calcification of the aortic valve. There is moderate thickening of the aortic valve. Aortic valve regurgitation is mild. No aortic stenosis is present. Aortic valve mean gradient measures 15.2 mmHg. Aortic valve peak gradient measures 29.2 mmHg. Aortic valve area, by VTI measures 1.04 cm. Pulmonic Valve: The pulmonic valve was normal in structure. Pulmonic valve regurgitation is not visualized. No evidence of pulmonic stenosis. Aorta: Aortic dilatation noted. There is mild dilatation of the ascending aorta, measuring 38 mm. Venous: The inferior vena cava is dilated in size with greater than 50% respiratory variability, suggesting right atrial pressure of 8 mmHg. IAS/Shunts: No atrial level shunt detected by color flow Doppler.  LEFT VENTRICLE PLAX 2D LVIDd:         4.80 cm LVIDs:         2.80 cm LV PW:         1.00 cm LV IVS:        1.00 cm LVOT diam:     2.10 cm LV SV:         53 LV SV Index:   21 LVOT Area:     3.46 cm  IVC IVC diam: 2.40 cm LEFT ATRIUM              Index       RIGHT ATRIUM           Index LA diam:        5.10 cm  2.07 cm/m  RA Area:     19.50 cm LA Vol (A2C):   86.9 ml  35.28 ml/m RA Volume:   56.00 ml  22.74 ml/m LA Vol (A4C):   101.0 ml 41.01 ml/m LA Biplane Vol: 100.0 ml 40.60 ml/m  AORTIC VALVE AV Area (Vmax):  1.06 cm AV Area (Vmean):   1.07 cm AV Area (VTI):     1.04 cm AV Vmax:           270.00 cm/s AV Vmean:          181.750 cm/s AV VTI:            0.503 m AV Peak Grad:      29.2 mmHg AV Mean Grad:      15.2 mmHg LVOT Vmax:         82.57 cm/s LVOT Vmean:        56.367 cm/s LVOT VTI:          0.152 m LVOT/AV VTI ratio: 0.30  AORTA Ao Root diam: 3.40 cm Ao Asc diam:  3.80 cm TRICUSPID VALVE TR Peak grad:   57.5 mmHg TR Vmax:        379.00 cm/s  SHUNTS Systemic VTI:  0.15 m Systemic Diam: 2.10 cm Skeet Latch MD Electronically signed by Skeet Latch  MD Signature Date/Time: 05/01/2020/1:01:17 PM    Final    Disposition   Pt is being discharged home today in good condition.  Follow-up Plans & Appointments     Follow-up Information    Fay Records, MD Follow up on 05/16/2020.   Specialty: Cardiology Why: at 4pm for your follow up appt.  Contact information: [address] [place] [address] [phone]              Discharge Instructions    Amb Referral to Cardiac Rehabilitation   Complete by: As directed    Diagnosis:  STEMI Coronary Stents     After initial evaluation and assessments completed: Virtual Based Care may be provided alone or in conjunction with Phase 2 Cardiac Rehab based on patient barriers.: Yes     Discharge Medications   Allergies as of 05/02/2020      Reactions   Rofecoxib Other (See Comments)   (Vioxx)      Medication List    STOP taking these medications   flecainide 100 MG tablet Commonly known as: TAMBOCOR   nystatin cream Commonly known as: MYCOSTATIN   nystatin powder Commonly known as: MYCOSTATIN/NYSTOP     TAKE these medications   apixaban 5 MG Tabs tablet Commonly known as: ELIQUIS Take 1 tablet (5 mg total) by mouth 2 (two) times daily.   aspirin 81 MG EC tablet Take 1 tablet (81 mg total) by mouth daily. Swallow whole. Start taking on: May 03, 2020   atorvastatin 80 MG tablet Commonly known as: LIPITOR Take 1 tablet (80 mg total) by mouth daily. Start taking on: May 03, 2020   clopidogrel 75 MG tablet Commonly known as: PLAVIX Take 1 tablet (75 mg total) by mouth daily with breakfast. Start taking on: May 03, 2020   fluticasone 50 MCG/ACT nasal spray Commonly known as: FLONASE Place 2 sprays into both nostrils daily. What changed:   when to take this  reasons to take this   furosemide 40 MG tablet Commonly known as: LASIX Take 1 tablet (40 mg total) by mouth daily. Start taking on: May 03, 2020 What changed:    medication strength  how much to take  when to take this  reasons to take this   metoprolol tartrate 25 MG tablet Commonly known as: LOPRESSOR Take 1.5 tablets (37.5 mg total) by mouth 2 (two) times daily.   multivitamin with minerals Tabs tablet Take 1 tablet by mouth daily.  nitroGLYCERIN 0.4 MG SL tablet Commonly known as: NITROSTAT Place 1 tablet (0.4 mg total) under the tongue every 5 (five) minutes x 3 doses as needed for chest pain.   Prostate Support 300-15 MG Tabs Take 1 tablet by mouth daily. Super Beta Prostate       Outstanding Labs/Studies   FLP/LFTs in 8 weeks  Duration of Discharge Encounter   Greater than 30 minutes including physician time.  Signed, Reino Bellis, NP 05/02/2020, 4:34 PM

## 2020-05-06 ENCOUNTER — Telehealth: Payer: Self-pay

## 2020-05-06 NOTE — Telephone Encounter (Signed)
Hospital follow up scheduled.   Patient complains of sinus infection and light blood in stool.   Advised patient urgent care for eval, advised not to wait until next week to get checked.

## 2020-05-10 ENCOUNTER — Other Ambulatory Visit: Payer: Self-pay

## 2020-05-10 ENCOUNTER — Encounter: Payer: Self-pay | Admitting: Family Medicine

## 2020-05-10 ENCOUNTER — Ambulatory Visit (INDEPENDENT_AMBULATORY_CARE_PROVIDER_SITE_OTHER): Payer: Medicare Other | Admitting: Family Medicine

## 2020-05-10 ENCOUNTER — Other Ambulatory Visit (HOSPITAL_COMMUNITY): Payer: Medicare Other

## 2020-05-10 ENCOUNTER — Encounter (HOSPITAL_COMMUNITY): Admission: RE | Admit: 2020-05-10 | Payer: Medicare Other | Source: Ambulatory Visit

## 2020-05-10 VITALS — BP 108/65 | HR 76 | Temp 97.5°F | Resp 20 | Ht 68.0 in | Wt 304.0 lb

## 2020-05-10 DIAGNOSIS — E1169 Type 2 diabetes mellitus with other specified complication: Secondary | ICD-10-CM | POA: Diagnosis not present

## 2020-05-10 DIAGNOSIS — I2121 ST elevation (STEMI) myocardial infarction involving left circumflex coronary artery: Secondary | ICD-10-CM | POA: Diagnosis not present

## 2020-05-10 DIAGNOSIS — I4819 Other persistent atrial fibrillation: Secondary | ICD-10-CM | POA: Diagnosis not present

## 2020-05-10 DIAGNOSIS — I2510 Atherosclerotic heart disease of native coronary artery without angina pectoris: Secondary | ICD-10-CM

## 2020-05-10 NOTE — Progress Notes (Signed)
Subjective:  Patient ID: Ryan Garner, male    DOB: 1946/04/06  Age: 74 y.o. MRN: [id number]  CC: Hospitalization Follow-up   HPI Ryan Garner presents for hospital follow-up from admission 04/30/20 with a discharge on 05/02/2020.  He is diagnosed with a ST elevation MI involving the left circumflex coronary artery.  Of note is that he has a history of hypertension diabetes and hyperlipidemia as well as morbid obesity.  He also was noted to be in atrial fibrillation at his cardiology evaluation on September 13.Marland Kitchen  Heart cath again showed blockage at the left circumflex artery.  The patient did have successful stenting/DES x1 to the distal left circumflex.  The left atrium was severely dilated.  There was mild to moderate mitral valve regurgitation. LVEF 60-65%  Patient had been hospitalized 1 month prior with cholecystitis he underwent cholecystectomy after multiple antibiotics had been given.  He had just started recuperating from that when the atrial fibrillation was found subsequently he was supposed to go through cardioversion but prior to that being performed the MI occurred.  He has had stenting and he is to see his heart specialist in the next week or so at that time they will determine the next step whether obese medical or cardioversion etc.  Depression screen [facility] 2/9 05/10/2020 04/14/2020 03/17/2020  Decreased Interest 0 0 0  Down, Depressed, Hopeless 0 1 0  PHQ - 2 Score 0 1 0  Altered sleeping - - -  Tired, decreased energy - - -  Change in appetite - - -  Feeling bad or failure about yourself  - - -  Trouble concentrating - - -  Moving slowly or fidgety/restless - - -  Suicidal thoughts - - -  PHQ-9 Score - - -  Difficult doing work/chores - - -    History Ryan Garner has a past medical history of A-fib ([place]), Hyperlipidemia, Hypertension, Metabolic syndrome, Prediabetes, and STEMI (ST elevation myocardial infarction) ([place]).   He has a past surgical history that includes Hernia  repair; Tonsillectomy; Rotator cuff repair (Bilateral); Eyelid Surgery (Bilateral); knee tendons repair; Appendectomy; Cholecystectomy (N/A, 03/02/2020); Coronary/Graft Acute MI Revascularization (N/A, 04/30/2020); LEFT HEART CATH AND CORONARY ANGIOGRAPHY (N/A, 04/30/2020); and CORONARY STENT INTERVENTION (N/A, 04/30/2020).   His family history includes Arthritis in his father; Cancer in his mother; Coronary artery disease in his mother; Diabetes in his brother and maternal grandmother; Heart attack in his brother.He reports that he quit smoking about 61 years ago. His smoking use included cigarettes. He has never used smokeless tobacco. He reports that he does not drink alcohol and does not use drugs.    ROS Review of Systems  Constitutional: Positive for fatigue. Negative for fever.  Respiratory: Positive for shortness of breath.   Cardiovascular: Negative for chest pain.  Musculoskeletal: Negative for arthralgias.  Skin: Negative for rash.  Neurological: Positive for weakness (symmetric - lack of endurance based on recent events. NONFOCAL).  Psychiatric/Behavioral: Positive for decreased concentration.    Objective:  BP 108/65   Pulse 76   Temp (!) 97.5 F (36.4 C) (Temporal)   Resp 20   Ht $R'5\' 8"'uD$  (1.727 m)   Wt (!) 304 lb (137.9 kg)   SpO2 97%   BMI 46.22 kg/m   BP Readings from Last 3 Encounters:  05/10/20 108/65  05/02/20 115/73  04/28/20 136/84    Wt Readings from Last 3 Encounters:  05/10/20 (!) 304 lb (137.9 kg)  05/02/20 (!) 301 lb 15.8 oz (137 kg)  04/21/20 Marland Kitchen)  310 lb 3.2 oz (140.7 kg)     Physical Exam Constitutional:      General: He is not in acute distress.    Appearance: He is well-developed. He is obese. He is ill-appearing.  HENT:     Head: Normocephalic and atraumatic.     Right Ear: External ear normal.     Left Ear: External ear normal.     Nose: Nose normal.  Eyes:     Conjunctiva/sclera: Conjunctivae normal.     Pupils: Pupils are equal, round,  and reactive to light.  Cardiovascular:     Rate and Rhythm: Normal rate and regular rhythm.     Heart sounds: Murmur (soft, blowing 2/6) heard.   Pulmonary:     Effort: Pulmonary effort is normal. No respiratory distress.     Breath sounds: Normal breath sounds. No wheezing or rales.  Abdominal:     Palpations: Abdomen is soft.     Tenderness: There is no abdominal tenderness.  Musculoskeletal:        General: Normal range of motion.     Cervical back: Normal range of motion and neck supple.  Skin:    General: Skin is warm and dry.  Neurological:     Mental Status: He is alert and oriented to person, place, and time.     Deep Tendon Reflexes: Reflexes are normal and symmetric.  Psychiatric:        Behavior: Behavior normal.        Thought Content: Thought content normal.        Judgment: Judgment normal.       Assessment & Plan:   Ryan Garner was seen today for hospitalization follow-up.  Diagnoses and all orders for this visit:  Persistent atrial fibrillation ([place]) -     CBC with Differential/Platelet -     CMP14+EGFR  Type 2 diabetes mellitus with other specified complication, without long-term current use of insulin (HCC) -     CBC with Differential/Platelet -     CMP14+EGFR  ASCVD (arteriosclerotic cardiovascular disease) -     CBC with Differential/Platelet -     CMP14+EGFR  ST elevation myocardial infarction involving left circumflex coronary artery ([place])       I am having Ryan Garner maintain his fluticasone, multivitamin with minerals, apixaban, Prostate Support, metoprolol tartrate, aspirin, nitroGLYCERIN, atorvastatin, furosemide, and clopidogrel.  Allergies as of 05/10/2020      Reactions   Rofecoxib Other (See Comments)   (Vioxx)      Medication List       Accurate as of May 10, 2020  3:32 PM. If you have any questions, ask your nurse or doctor.        apixaban 5 MG Tabs tablet Commonly known as: ELIQUIS Take 1 tablet (5 mg total) by  mouth 2 (two) times daily.   aspirin 81 MG EC tablet Take 1 tablet (81 mg total) by mouth daily. Swallow whole.   atorvastatin 80 MG tablet Commonly known as: LIPITOR Take 1 tablet (80 mg total) by mouth daily.   clopidogrel 75 MG tablet Commonly known as: PLAVIX Take 1 tablet (75 mg total) by mouth daily with breakfast.   fluticasone 50 MCG/ACT nasal spray Commonly known as: FLONASE Place 2 sprays into both nostrils daily. What changed:   when to take this  reasons to take this   furosemide 40 MG tablet Commonly known as: LASIX Take 1 tablet (40 mg total) by mouth daily.   metoprolol tartrate 25 MG tablet  Commonly known as: LOPRESSOR Take 1.5 tablets (37.5 mg total) by mouth 2 (two) times daily.   multivitamin with minerals Tabs tablet Take 1 tablet by mouth daily.   nitroGLYCERIN 0.4 MG SL tablet Commonly known as: NITROSTAT Place 1 tablet (0.4 mg total) under the tongue every 5 (five) minutes x 3 doses as needed for chest pain.   Prostate Support 300-15 MG Tabs Take 1 tablet by mouth daily. Super Beta Prostate        Follow-up: Return in about 3 months (around 08/10/2020) for diabetes, Dr. Warrick Parisian.  Claretta Fraise, M.D.

## 2020-05-11 LAB — CMP14+EGFR
ALT: 21 IU/L (ref 0–44)
AST: 18 IU/L (ref 0–40)
Albumin/Globulin Ratio: 1.8 (ref 1.2–2.2)
Albumin: 4 g/dL (ref 3.7–4.7)
Alkaline Phosphatase: 74 IU/L (ref 44–121)
BUN/Creatinine Ratio: 18 (ref 10–24)
BUN: 22 mg/dL (ref 8–27)
Bilirubin Total: 0.4 mg/dL (ref 0.0–1.2)
CO2: 23 mmol/L (ref 20–29)
Calcium: 9.5 mg/dL (ref 8.6–10.2)
Chloride: 100 mmol/L (ref 96–106)
Creatinine, Ser: 1.24 mg/dL (ref 0.76–1.27)
GFR calc Af Amer: 65 mL/min/{1.73_m2} (ref >59)
GFR calc non Af Amer: 56 mL/min/{1.73_m2} — ABNORMAL LOW (ref >59)
Globulin, Total: 2.2 g/dL (ref 1.5–4.5)
Glucose: 105 mg/dL — ABNORMAL HIGH (ref 65–99)
Potassium: 4.5 mmol/L (ref 3.5–5.2)
Sodium: 138 mmol/L (ref 134–144)
Total Protein: 6.2 g/dL (ref 6.0–8.5)

## 2020-05-11 LAB — CBC WITH DIFFERENTIAL/PLATELET
Basophils Absolute: 0.1 10*3/uL (ref 0.0–0.2)
Basos: 1 %
EOS (ABSOLUTE): 0.1 10*3/uL (ref 0.0–0.4)
Eos: 1 %
Hematocrit: 39.4 % (ref 37.5–51.0)
Hemoglobin: 13.5 g/dL (ref 13.0–17.7)
Immature Grans (Abs): 0 10*3/uL (ref 0.0–0.1)
Immature Granulocytes: 0 %
Lymphocytes Absolute: 2.3 10*3/uL (ref 0.7–3.1)
Lymphs: 26 %
MCH: 29.5 pg (ref 26.6–33.0)
MCHC: 34.3 g/dL (ref 31.5–35.7)
MCV: 86 fL (ref 79–97)
Monocytes Absolute: 0.7 10*3/uL (ref 0.1–0.9)
Monocytes: 8 %
Neutrophils Absolute: 5.5 10*3/uL (ref 1.4–7.0)
Neutrophils: 64 %
Platelets: 305 10*3/uL (ref 150–450)
RBC: 4.58 x10E6/uL (ref 4.14–5.80)
RDW: 13 % (ref 11.6–15.4)
WBC: 8.7 10*3/uL (ref 3.4–10.8)

## 2020-05-11 NOTE — Progress Notes (Signed)
Hello Brack,  Your lab result is normal and/or stable.Some minor variations that are not significant are commonly marked abnormal, but do not represent any medical problem for you.  Best regards, Ruhaan Nordahl, M.D.

## 2020-05-12 ENCOUNTER — Telehealth: Payer: Self-pay

## 2020-05-12 ENCOUNTER — Encounter (HOSPITAL_COMMUNITY): Admission: RE | Payer: Self-pay | Source: Home / Self Care

## 2020-05-12 ENCOUNTER — Ambulatory Visit (HOSPITAL_COMMUNITY): Admission: RE | Admit: 2020-05-12 | Payer: Medicare Other | Source: Home / Self Care | Admitting: Cardiology

## 2020-05-12 SURGERY — CARDIOVERSION
Anesthesia: Monitor Anesthesia Care

## 2020-05-12 NOTE — Telephone Encounter (Signed)
Patient is passing blood x 2 days.  Just saw Dr. Livia Snellen for his hospital f/u.  What should he do? Please call.  No other symptoms.

## 2020-05-12 NOTE — Telephone Encounter (Signed)
Attempted to contact - NA 

## 2020-05-15 NOTE — Progress Notes (Signed)
Cardiology Office Note   Date:  05/16/2020   ID:  Ryan Garner, DOB 01-18-1946, MRN [id number]  PCP:  [name], [name], MD  Cardiologist:   Ryan Carnes, MD   Pt presents for follow up of atrial fib and CAD   History of Present Illness: Ryan Garner is a 74 y.o. male with a history of HTN, HL, pre DM, morbid obesity, atrial fibrillation and CAD    I  saw him in consult in Aug 2021.  He was admitted to [facility] on 04/30/20 with CP and STEMI.  Underwent cath with PTCA/DES of LCx    Flecanide stopped  He was seen by Ryan Garner (EP  plan for rate control and anticoagulatoin  Not a candidate for Tikosyn.  Amiodarone alos not felt to be a good choice.    Since discharge he denies CP   He still gives out with activity, SOB    Pt has had a very difficult year.   Beyond medical problems daughter contracted COVID and died in April 28, 2023   Current Meds  Medication Sig  . apixaban (ELIQUIS) 5 MG TABS tablet Take 1 tablet (5 mg total) by mouth 2 (two) times daily.  Marland Kitchen aspirin 81 MG EC tablet Take 1 tablet (81 mg total) by mouth daily for 16 days. Swallow whole.  Marland Kitchen atorvastatin (LIPITOR) 80 MG tablet Take 1 tablet (80 mg total) by mouth daily.  . clopidogrel (PLAVIX) 75 MG tablet Take 1 tablet (75 mg total) by mouth daily with breakfast.  . fluticasone (FLONASE) 50 MCG/ACT nasal spray Place 2 sprays into both nostrils daily. (Patient taking differently: Place 2 sprays into both nostrils daily as needed for allergies. )  . furosemide (LASIX) 40 MG tablet Take 1 tablet (40 mg total) by mouth daily.  . metoprolol tartrate (LOPRESSOR) 25 MG tablet Take 1.5 tablets (37.5 mg total) by mouth 2 (two) times daily.  . Misc Natural Products (PROSTATE SUPPORT) 300-15 MG TABS Take 1 tablet by mouth daily. Super Beta Prostate   . Multiple Vitamin (MULTIVITAMIN WITH MINERALS) TABS tablet Take 1 tablet by mouth daily.  . nitroGLYCERIN (NITROSTAT) 0.4 MG SL tablet Place 1 tablet (0.4 mg total) under the tongue every 5  (five) minutes x 3 doses as needed for chest pain.  . [DISCONTINUED] aspirin EC 81 MG EC tablet Take 1 tablet (81 mg total) by mouth daily. Swallow whole.     Allergies:   Rofecoxib   Past Medical History:  Diagnosis Date  . A-fib ([place])   . Hyperlipidemia   . Hypertension   . Metabolic syndrome   . Prediabetes   . STEMI (ST elevation myocardial infarction) [facility])     Past Surgical History:  Procedure Laterality Date  . APPENDECTOMY    . CHOLECYSTECTOMY N/A 03/02/2020   Procedure: LAPAROSCOPIC CHOLECYSTECTOMY;  Surgeon: Ryan Signs, MD;  Location: [facility];  Service: General;  Laterality: N/A;  . CORONARY STENT INTERVENTION N/A 04/30/2020   Procedure: CORONARY STENT INTERVENTION;  Surgeon: Martinique, Peter M, MD;  Location: [place] CV LAB;  Service: Cardiovascular;  Laterality: N/A;  . CORONARY/GRAFT ACUTE MI REVASCULARIZATION N/A 04/30/2020   Procedure: Coronary/Graft Acute MI Revascularization;  Surgeon: Martinique, Peter M, MD;  Location: [place] CV LAB;  Service: Cardiovascular;  Laterality: N/A;  . Eyelid Surgery Bilateral   . HERNIA REPAIR    . knee tendons repair    . LEFT HEART CATH AND CORONARY ANGIOGRAPHY N/A 04/30/2020   Procedure: LEFT HEART CATH AND CORONARY  ANGIOGRAPHY;  Surgeon: Martinique, Peter M, MD;  Location: [place] CV LAB;  Service: Cardiovascular;  Laterality: N/A;  . ROTATOR CUFF REPAIR Bilateral   . TONSILLECTOMY       Social History:  The patient  reports that he quit smoking about 61 years ago. His smoking use included cigarettes. He has never used smokeless tobacco. He reports that he does not drink alcohol and does not use drugs.   Family History:  The patient's family history includes Arthritis in his father; Cancer in his mother; Coronary artery disease in his mother; Diabetes in his brother and maternal grandmother; Heart attack in his brother.    ROS:  Please see the history of present illness. All other systems are reviewed and  Negative to the  above problem except as noted.    PHYSICAL EXAM: VS:  BP 108/62   Pulse 66   Ht 5\' 8"  (1.727 Garner)   Wt (!) 303 lb 3.2 oz (137.5 kg)   SpO2 94%   BMI 46.10 kg/Garner   GEN: Morbidly obese 74 yo in no acute distress  HEENT: normal  Neck: no JVD, carotid bruits Cardiac: Irreg irreg ; no murmurs   1+ LE edema (wearing support hose) Respiratory:  clear to auscultation bilaterally, GI: soft, obese  Nontender  MS: no deformity Moving all extremities   Skin: warm and dry, no rash Neuro:  Strength and sensation are intact Psych: euthymic mood, full affect   EKG:  EKG is not ordered today.  Cardiac Cath 10/9    Cath: 04/30/20   Ramus lesion is 75% stenosed.  Mid Cx lesion is 99% stenosed.  Post intervention, there is a 0% residual stenosis.  A drug-eluting stent was successfully placed using a STENT RESOLUTE ONYX 2.5X34.  The left ventricular systolic function is normal.  LV end diastolic pressure is mildly elevated.  The left ventricular ejection fraction is 55-65% by visual estimate.  1. 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 Good LV systolic function 3. Elevated LVEDP 4. Successful PCI of the mid to distal LCx with DES x 1.  Plan: ASA 81 mg daily for one month. Plavix 75 mg daily for one year. May resume Eliquis tomorrow. IV diuresis. Will need to stop Flecainide. Consider alternative AAD therapy.  ECHO   05/01/20  1. Anterior and anterolateral hypokinesis. Left ventricular ejection fraction, by estimation, is 60 to 65%. The left ventricle has normal function. The left ventricle has no regional wall motion abnormalities. Left ventricular diastolic parameters are indeterminate. 2. Right ventricular systolic function is normal. The right ventricular size is normal. There is severely elevated pulmonary artery systolic pressure. 3. Left atrial size was severely dilated. 4. The mitral valve is normal in structure. Mild to moderate mitral valve  regurgitation. No evidence of mitral stenosis. 5. The aortic valve is calcified. There is moderate calcification of the aortic valve. There is moderate thickening of the aortic valve. Aortic valve regurgitation is mild. No aortic stenosis is present. Aortic valve area, by VTI measures 1.04 cm. Aortic valve mean gradient measures 15.2 mmHg. Aortic valve Vmax measures 2.70 Garner/s. 6. Aortic dilatation noted. There is mild dilatation of the ascending aorta, measuring 38 mm. 7. The inferior vena cava is dilated in size with >50% respiratory variability, suggesting right atrial pressure of 8 mmHg.  Lipid Panel    Component Value Date/Time   CHOL 122 05/01/2020 1759   CHOL 166 09/04/2018 1156   TRIG 63 05/01/2020 1759  TRIG 74 08/27/2014 0826   HDL 30 (L) 05/01/2020 1759   HDL 42 09/04/2018 1156   HDL 56 08/27/2014 0826   CHOLHDL 4.1 05/01/2020 1759   VLDL 13 05/01/2020 1759   LDLCALC 79 05/01/2020 1759   LDLCALC 108 (H) 09/04/2018 1156   LDLCALC 78 11/13/2013 0911      Wt Readings from Last 3 Encounters:  05/16/20 (!) 303 lb 3.2 oz (137.5 kg)  05/10/20 (!) 304 lb (137.9 kg)  05/02/20 (!) 301 lb 15.8 oz (137 kg)      ASSESSMENT AND PLAN: 1   CAD   Pt with recent STEMI   No symptoms of angina   I am not convinced dyspnea is anginal equivalent  Will go ahead and enroll in cardiac rehab ASA and PLavix until 11/10 then just Plavix   2  Atrial fibrillation   Pt was on flecanide   Stopped when came in with STEMI   He is not a candidate for Tikosyn   AMiodarone is not a good choice   He has been evaluated by Ryan Garner   Recomm weight loss and weight control for now   Continue anticoagulation   3  HL  Keep on atorvastatin    LDL 79 in OCtober  Follow up later this winter  SHould be tighter    F/U in 2 to 3 months    Current medicines are reviewed at length with the patient today.  The patient does not have concerns regarding medicines.  Signed, Ryan Carnes, MD  05/16/2020 9:27 PM     [address], [place], [place]  [address] Phone: [phone]; Fax: [phone]

## 2020-05-16 ENCOUNTER — Other Ambulatory Visit: Payer: Self-pay

## 2020-05-16 ENCOUNTER — Encounter: Payer: Self-pay | Admitting: Internal Medicine

## 2020-05-16 ENCOUNTER — Ambulatory Visit (INDEPENDENT_AMBULATORY_CARE_PROVIDER_SITE_OTHER): Payer: Medicare Other | Admitting: Internal Medicine

## 2020-05-16 VITALS — BP 108/62 | HR 66 | Ht 68.0 in | Wt 303.2 lb

## 2020-05-16 DIAGNOSIS — E782 Mixed hyperlipidemia: Secondary | ICD-10-CM

## 2020-05-16 DIAGNOSIS — I2510 Atherosclerotic heart disease of native coronary artery without angina pectoris: Secondary | ICD-10-CM

## 2020-05-16 DIAGNOSIS — I4819 Other persistent atrial fibrillation: Secondary | ICD-10-CM

## 2020-05-16 MED ORDER — ASPIRIN 81 MG PO TBEC: 81.0000 mg | DELAYED_RELEASE_TABLET | Freq: Every day | ORAL | 0 refills | Status: AC

## 2020-05-16 NOTE — Patient Instructions (Addendum)
Medication Instructions:  Your physician has recommended you make the following change in your medication:  1.) stop aspirin after 06/01/20  *If you need a refill on your cardiac medications before your next appointment, please call your pharmacy*   Lab Work: none If you have labs (blood work) drawn today and your tests are completely normal, you will receive your results only by: Marland Kitchen MyChart Message (if you have MyChart) OR . A paper copy in the mail If you have any lab test that is abnormal or we need to change your treatment, we will call you to review the results.   Testing/Procedures: none   Follow-Up: At [facility], you and your health needs are our priority.  As part of our continuing mission to provide you with exceptional heart care, we have created designated Provider Care Teams.  These Care Teams include your primary Cardiologist (physician) and Advanced Practice Providers (APPs -  Physician Assistants and Nurse Practitioners) who all work together to provide you with the care you need, when you need it.  We recommend signing up for the patient portal called "MyChart".  Sign up information is provided on this After Visit Summary.  MyChart is used to connect with patients for Virtual Visits (Telemedicine).  Patients are able to view lab/test results, encounter notes, upcoming appointments, etc.  Non-urgent messages can be sent to your provider as well.   To learn more about what you can do with MyChart, go to [email or web address].    Your next appointment:   2-3 month(s)  The format for your next appointment:   In Person  Provider:   You may see Dorris Carnes, MD or one of the following Advanced Practice Providers on your designated Care Team:    Richardson Dopp, PA-C  Robbie Lis, [place]   Other Instructions You have been referred to Cardiac Rehabilitation

## 2020-05-18 ENCOUNTER — Other Ambulatory Visit: Payer: Self-pay

## 2020-05-18 ENCOUNTER — Encounter: Payer: Self-pay | Admitting: Family Medicine

## 2020-05-18 ENCOUNTER — Ambulatory Visit (INDEPENDENT_AMBULATORY_CARE_PROVIDER_SITE_OTHER): Payer: Medicare Other | Admitting: Family Medicine

## 2020-05-18 ENCOUNTER — Ambulatory Visit (INDEPENDENT_AMBULATORY_CARE_PROVIDER_SITE_OTHER): Payer: Medicare Other

## 2020-05-18 VITALS — BP 123/75 | HR 116 | Temp 97.0°F | Ht 68.0 in | Wt 302.0 lb

## 2020-05-18 DIAGNOSIS — R0602 Shortness of breath: Secondary | ICD-10-CM

## 2020-05-18 DIAGNOSIS — R5383 Other fatigue: Secondary | ICD-10-CM | POA: Diagnosis not present

## 2020-05-18 DIAGNOSIS — R609 Edema, unspecified: Secondary | ICD-10-CM

## 2020-05-18 DIAGNOSIS — I2510 Atherosclerotic heart disease of native coronary artery without angina pectoris: Secondary | ICD-10-CM | POA: Diagnosis not present

## 2020-05-18 NOTE — Progress Notes (Signed)
BP 123/75   Pulse (!) 116   Temp (!) 97 F (36.1 C)   Ht _0  (1.727 m)   Wt (!) 302 lb (137 kg)   SpO2 96%   BMI 45.92 kg/m    Subjective:   Patient ID: Ryan Garner, male    DOB: [birth year], 74 y.o.   MRN: [id number]  HPI: Ryan Garner is a 74 y.o. male presenting on 05/18/2020 for Fatigue and Shortness of Breath   HPI Patient is coming in today with complaints of fatigue and shortness of breath and decreased energy and increased swelling in his legs.  Over the past couple months he had a major gallbladder surgery from severe cholecystitis and then earlier this month about 3 weeks ago he had an NSTEMI with blockages and stents and was found to have A. fib which was new.  He was started on blood thinners.  He is taking both Eliquis and Plavix.  Patient says shortness of breath is slightly improved but he is definitely still filling it and he feels like the swelling in his legs is worsened.  He denies any increase in weight.  Relevant past medical, surgical, family and social history reviewed and updated as indicated. Interim medical history since our last visit reviewed. Allergies and medications reviewed and updated.  Review of Systems  Constitutional: Positive for fatigue. Negative for chills and fever.  Eyes: Negative for visual disturbance.  Respiratory: Positive for shortness of breath. Negative for wheezing.   Cardiovascular: Positive for leg swelling. Negative for chest pain.  Musculoskeletal: Negative for back pain and gait problem.  Skin: Negative for rash.  Neurological: Negative for dizziness and light-headedness.  All other systems reviewed and are negative.   Per HPI unless specifically indicated above   Allergies as of 05/18/2020      Reactions   Rofecoxib Other (See Comments)   (Vioxx)      Medication List       Accurate as of May 18, 2020  8:44 AM. If you have any questions, ask your nurse or doctor.        apixaban 5 MG Tabs tablet Commonly  known as: ELIQUIS Take 1 tablet (5 mg total) by mouth 2 (two) times daily.   aspirin 81 MG EC tablet Take 1 tablet (81 mg total) by mouth daily for 16 days. Swallow whole.   atorvastatin 80 MG tablet Commonly known as: LIPITOR Take 1 tablet (80 mg total) by mouth daily.   clopidogrel 75 MG tablet Commonly known as: PLAVIX Take 1 tablet (75 mg total) by mouth daily with breakfast.   fluticasone 50 MCG/ACT nasal spray Commonly known as: FLONASE Place 2 sprays into both nostrils daily. What changed:   when to take this  reasons to take this   furosemide 40 MG tablet Commonly known as: LASIX Take 1 tablet (40 mg total) by mouth daily.   metoprolol tartrate 25 MG tablet Commonly known as: LOPRESSOR Take 1.5 tablets (37.5 mg total) by mouth 2 (two) times daily.   multivitamin with minerals Tabs tablet Take 1 tablet by mouth daily.   nitroGLYCERIN 0.4 MG SL tablet Commonly known as: NITROSTAT Place 1 tablet (0.4 mg total) under the tongue every 5 (five) minutes x 3 doses as needed for chest pain.   Prostate Support 300-15 MG Tabs Take 1 tablet by mouth daily. Super Beta Prostate        Objective:   BP 123/75   Pulse (!) 116   Temp (!)  97 F (36.1 C)   Ht _0  (1.727 m)   Wt (!) 302 lb (137 kg)   SpO2 96%   BMI 45.92 kg/m   Wt Readings from Last 3 Encounters:  05/18/20 (!) 302 lb (137 kg)  05/16/20 (!) 303 lb 3.2 oz (137.5 kg)  05/10/20 (!) 304 lb (137.9 kg)    Physical Exam Vitals and nursing note reviewed.  Constitutional:      General: He is not in acute distress.    Appearance: He is well-developed. He is not diaphoretic.  Eyes:     General: No scleral icterus.    Conjunctiva/sclera: Conjunctivae normal.  Neck:     Thyroid: No thyromegaly.  Cardiovascular:     Rate and Rhythm: Normal rate. Rhythm irregular.     Heart sounds: Normal heart sounds. No murmur heard.   Pulmonary:     Effort: Pulmonary effort is normal. No respiratory distress.      Breath sounds: Normal breath sounds. No wheezing, rhonchi or rales.  Musculoskeletal:        General: Normal range of motion.     Cervical back: Neck supple.  Lymphadenopathy:     Cervical: No cervical adenopathy.  Skin:    General: Skin is warm and dry.     Findings: No rash.  Neurological:     Mental Status: He is alert and oriented to person, place, and time.     Coordination: Coordination normal.  Psychiatric:        Behavior: Behavior normal.     Chest x-ray: Patient has a small amount of interstitial edema, await final read from radiologist.  Assessment & Plan:   Problem List Items Addressed This Visit    None    Visit Diagnoses    Fatigue, unspecified type    -  Primary   Relevant Orders   CBC with Differential/Platelet   CMP14+EGFR   TSH   Brain natriuretic peptide   DG Chest 2 View   Exertional shortness of breath       Relevant Orders   CBC with Differential/Platelet   CMP14+EGFR   TSH   Brain natriuretic peptide   DG Chest 2 View   Peripheral edema       Relevant Orders   CBC with Differential/Platelet   CMP14+EGFR   TSH   Brain natriuretic peptide   DG Chest 2 View      Will do blood work but some concern that the patient may have a small amount of congestive heart failure but just had echocardiogram earlier this month, will run blood work and see if there is any other causes of fatigue, also just started a blood thinner so may be bleeding, will check for anemia. Follow up plan: Return if symptoms worsen or fail to improve.  Counseling provided for all of the vaccine components Orders Placed This Encounter  Procedures  . DG Chest 2 View  . CBC with Differential/Platelet  . CMP14+EGFR  . TSH  . Brain natriuretic peptide    Caryl Pina, MD [facility] 05/18/2020, 8:44 AM

## 2020-05-19 LAB — CBC WITH DIFFERENTIAL/PLATELET
Basophils Absolute: 0 10*3/uL (ref 0.0–0.2)
Basos: 1 %
EOS (ABSOLUTE): 0.1 10*3/uL (ref 0.0–0.4)
Eos: 2 %
Hematocrit: 39.9 % (ref 37.5–51.0)
Hemoglobin: 13.4 g/dL (ref 13.0–17.7)
Immature Grans (Abs): 0 10*3/uL (ref 0.0–0.1)
Immature Granulocytes: 0 %
Lymphocytes Absolute: 1.5 10*3/uL (ref 0.7–3.1)
Lymphs: 18 %
MCH: 29.3 pg (ref 26.6–33.0)
MCHC: 33.6 g/dL (ref 31.5–35.7)
MCV: 87 fL (ref 79–97)
Monocytes Absolute: 0.8 10*3/uL (ref 0.1–0.9)
Monocytes: 9 %
Neutrophils Absolute: 5.7 10*3/uL (ref 1.4–7.0)
Neutrophils: 70 %
Platelets: 289 10*3/uL (ref 150–450)
RBC: 4.58 x10E6/uL (ref 4.14–5.80)
RDW: 12.8 % (ref 11.6–15.4)
WBC: 8.1 10*3/uL (ref 3.4–10.8)

## 2020-05-19 LAB — CMP14+EGFR
ALT: 21 IU/L (ref 0–44)
AST: 16 IU/L (ref 0–40)
Albumin/Globulin Ratio: 1.7 (ref 1.2–2.2)
Albumin: 3.8 g/dL (ref 3.7–4.7)
Alkaline Phosphatase: 76 IU/L (ref 44–121)
BUN/Creatinine Ratio: 23 (ref 10–24)
BUN: 25 mg/dL (ref 8–27)
Bilirubin Total: 0.4 mg/dL (ref 0.0–1.2)
CO2: 22 mmol/L (ref 20–29)
Calcium: 9.6 mg/dL (ref 8.6–10.2)
Chloride: 104 mmol/L (ref 96–106)
Creatinine, Ser: 1.09 mg/dL (ref 0.76–1.27)
GFR calc Af Amer: 76 mL/min/{1.73_m2} (ref >59)
GFR calc non Af Amer: 66 mL/min/{1.73_m2} (ref >59)
Globulin, Total: 2.3 g/dL (ref 1.5–4.5)
Glucose: 119 mg/dL — ABNORMAL HIGH (ref 65–99)
Potassium: 4.5 mmol/L (ref 3.5–5.2)
Sodium: 141 mmol/L (ref 134–144)
Total Protein: 6.1 g/dL (ref 6.0–8.5)

## 2020-05-19 LAB — BRAIN NATRIURETIC PEPTIDE: BNP: 325.6 pg/mL — ABNORMAL HIGH (ref 0.0–100.0)

## 2020-05-19 LAB — TSH: TSH: 2.17 u[IU]/mL (ref 0.450–4.500)

## 2020-05-24 ENCOUNTER — Telehealth: Payer: Medicare Other

## 2020-05-27 ENCOUNTER — Telehealth: Payer: Self-pay | Admitting: Family Medicine

## 2020-05-27 DIAGNOSIS — R0602 Shortness of breath: Secondary | ICD-10-CM

## 2020-05-27 NOTE — Telephone Encounter (Signed)
Per [name] pt should continuing doubling Lasix for three more days. Pt should come in for BMP early next week.  Pt made aware and future orders placed for BMP.

## 2020-05-27 NOTE — Telephone Encounter (Signed)
Pt calling  about shortness of breath, said [name] told him to double on medicine for three days to see if it would would help SOB, says it has and wants to know if he should continue or stop

## 2020-06-02 ENCOUNTER — Ambulatory Visit: Payer: Medicare Other | Admitting: *Deleted

## 2020-06-02 DIAGNOSIS — I10 Essential (primary) hypertension: Secondary | ICD-10-CM

## 2020-06-02 DIAGNOSIS — E782 Mixed hyperlipidemia: Secondary | ICD-10-CM

## 2020-06-02 DIAGNOSIS — I4819 Other persistent atrial fibrillation: Secondary | ICD-10-CM

## 2020-06-02 DIAGNOSIS — R0602 Shortness of breath: Secondary | ICD-10-CM
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
## 2020-06-07 ENCOUNTER — Telehealth: Payer: Self-pay

## 2020-06-07 MED ORDER — FUROSEMIDE 40 MG PO TABS
40.0000 mg | ORAL_TABLET | Freq: Every day | ORAL | 0 refills | Status: DC
Start: 2020-06-07 — End: 2020-07-25

## 2020-06-07 NOTE — Telephone Encounter (Signed)
  Prescription Request  06/07/2020  What is the name of the medication or equipment? furosemide (LASIX) 40 MG tablet    Have you contacted your pharmacy to request a refill? (if applicable) yes  Which pharmacy would you like this sent to? [facility] [place]    Patient notified that their request is being sent to the clinical staff for review and that they should receive a response within 2 business days.

## 2020-06-07 NOTE — Telephone Encounter (Signed)
Sent for pt

## 2020-06-08 ENCOUNTER — Encounter (HOSPITAL_COMMUNITY)
Admission: RE | Admit: 2020-06-08 | Discharge: 2020-06-08 | Disposition: A | Discharge status: Home / Self Care | Payer: Medicare Other | Source: Ambulatory Visit | Attending: Internal Medicine | Admitting: Internal Medicine

## 2020-06-08 ENCOUNTER — Other Ambulatory Visit: Payer: Self-pay

## 2020-06-08 VITALS — BP 100/70 | HR 98 | Ht 68.0 in | Wt 300.9 lb

## 2020-06-08 DIAGNOSIS — I2121 ST elevation (STEMI) myocardial infarction involving left circumflex coronary artery: Secondary | ICD-10-CM | POA: Insufficient documentation

## 2020-06-08 DIAGNOSIS — Z955 Presence of coronary angioplasty implant and graft: Secondary | ICD-10-CM | POA: Insufficient documentation

## 2020-06-08 NOTE — Progress Notes (Signed)
Cardiac Individual Treatment Plan  Patient Details  Name: Ryan Garner MRN: [id number] Date of Birth: 08-Dec-1945 Referring Provider:     CARDIAC REHAB PHASE II ORIENTATION from 06/08/2020 in [facility]  Referring Provider Dr. 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  apixaban (ELIQUIS) 5 MG TABS tablet, Take 1 tablet (5 mg total) by mouth 2 (two) times daily., Disp: 28 tablet, Rfl: 0 .  atorvastatin (LIPITOR) 80 MG tablet, Take 1 tablet (80 mg total) by mouth daily., Disp: 90 tablet, Rfl: 1 .  clopidogrel (PLAVIX) 75 MG tablet, Take 1 tablet (75 mg total) by mouth daily with breakfast., Disp: 90 tablet, Rfl: 2 .  fluticasone (FLONASE) 50 MCG/ACT nasal spray, Place 2 sprays into both nostrils daily. (Patient taking differently: Place 2 sprays into both nostrils daily as needed for allergies. ), Disp: 16 g, Rfl: 6 .  furosemide (LASIX) 40 MG tablet, Take 1 tablet (40 mg total) by mouth daily., Disp: 90 tablet, Rfl: 0 .  metoprolol tartrate (LOPRESSOR) 25 MG tablet, Take 1.5 tablets (37.5 mg total) by mouth 2 (two) times daily., Disp: 90 tablet, Rfl: 6 .  Misc Natural Products (PROSTATE SUPPORT) 300-15 MG TABS, Take 1 tablet by mouth daily. Super Beta Prostate , Disp: , Rfl:  .  Multiple Vitamin (MULTIVITAMIN WITH MINERALS) TABS tablet, Take 1 tablet by mouth daily., Disp: , Rfl:  .  nitroGLYCERIN (NITROSTAT) 0.4 MG SL tablet, Place 1 tablet (0.4 mg total) under the tongue every 5 (five) minutes x 3 doses as needed for chest pain., Disp: 25 tablet, Rfl: 2  Past Medical History: Past Medical History:  Diagnosis Date  . A-fib ([place])   .  Hyperlipidemia   . Hypertension   . Metabolic syndrome   . Prediabetes   . STEMI (ST elevation myocardial infarction) ([place])     Tobacco Use: Social History   Tobacco Use  Smoking Status Former Smoker  . Types: Cigarettes  . Quit date: 04/02/1959  . Years since quitting: 61.2  Smokeless Tobacco Never Used    Labs: Recent Review Flowsheet Data    Labs for ITP Cardiac and Pulmonary Rehab Latest Ref Rng & Units 12/19/2017 09/04/2018 03/01/2020 04/30/2020 05/01/2020   Cholestrol 0 - 200 mg/dL 199 166 - 123 122   LDLCALC 0 - 99 mg/dL 129(H) 108(H) - 78 79   HDL >40 mg/dL 41 42 - 27(L) 30(L)   Trlycerides <150 mg/dL 144 82 - 88 63   Hemoglobin A1c 4.8 - 5.6 % 6.1 5.8 6.0(H) 6.2(H) -   TCO2 22 - 32 mmol/L - - - 21(L) -      Capillary Blood Glucose: Lab Results  Component Value Date   GLUCAP 119 (H) 05/02/2020   GLUCAP 116 (H) 05/02/2020   GLUCAP 200 (H) 05/02/2020   GLUCAP 114 (H) 05/01/2020   GLUCAP 115 (H) 05/01/2020     Exercise Target Goals: Exercise Program Goal: Individual exercise prescription set using results from initial 6 min walk test and THRR while considering  patient's activity barriers and safety.   Exercise Prescription Goal: Starting with aerobic activity 30 plus minutes a day, 3 days per week for initial exercise  prescription. Provide home exercise prescription and guidelines that participant acknowledges understanding prior to discharge.  Activity Barriers & Risk Stratification:   6 Minute Walk:  6 Minute Walk    Row Name 06/08/20 1408         6 Minute Walk   Phase Initial     Distance 700 feet     Walk Time 6 minutes     # of Rest Breaks 2     MPH 1.33     METS 0.31     RPE 15     Perceived Dyspnea  15     VO2 Peak 1.07     Symptoms Yes (comment)     Comments In afib during the test. Took 2 standing rest breaks for 45 and 30 seconds due to SOB     Resting HR 74 bpm     Resting BP 100/70     Resting Oxygen Saturation  97 %     Exercise  Oxygen Saturation  during 6 min walk 97 %     Max Ex. HR 99 bpm     Max Ex. BP 112/64     2 Minute Post BP 102/62            Oxygen Initial Assessment:   Oxygen Re-Evaluation:   Oxygen Discharge (Final Oxygen Re-Evaluation):   Initial Exercise Prescription:  Initial Exercise Prescription - 06/08/20 1400      Date of Initial Exercise RX and Referring Provider   Date 06/08/20    Referring Provider Dr. Harrington Challenger    Expected Discharge Date 09/16/20      NuStep   Level 1    SPM 60    Minutes 22      Arm Ergometer   Level 1    RPM 60    Minutes 17      Prescription Details   Frequency (times per week) 3    Duration Progress to 30 minutes of continuous aerobic without signs/symptoms of physical distress      Intensity   THRR 40-80% of Max Heartrate 58-115    Ratings of Perceived Exertion 11-13    Perceived Dyspnea 0-4      Progression   Progression Continue progressive overload as per policy without signs/symptoms or physical distress.      Resistance Training   Training Prescription Yes    Weight 3 lbs    Reps 10-15           Perform Capillary Blood Glucose checks as needed.  Exercise Prescription Changes:   Exercise Comments:   Exercise Goals and Review:   Exercise Goals    Row Name 06/08/20 1413             Exercise Goals   Increase Physical Activity Yes       Intervention Provide advice, education, support and counseling about physical activity/exercise needs.;Develop an individualized exercise prescription for aerobic and resistive training based on initial evaluation findings, risk stratification, comorbidities and participant's personal goals.       Expected Outcomes Short Term: Attend rehab on a regular basis to increase amount of physical activity.;Long Term: Add in home exercise to make exercise part of routine and to increase amount of physical activity.;Long Term: Exercising regularly at least 3-5 days a week.       Increase Strength and  Stamina Yes       Intervention Provide advice, education, support and counseling about physical activity/exercise needs.;Develop an individualized exercise prescription for aerobic and resistive  training based on initial evaluation findings, risk stratification, comorbidities and participant's personal goals.       Expected Outcomes Short Term: Increase workloads from initial exercise prescription for resistance, speed, and METs.;Short Term: Perform resistance training exercises routinely during rehab and add in resistance training at home;Long Term: Improve cardiorespiratory fitness, muscular endurance and strength as measured by increased METs and functional capacity (6MWT)       Able to understand and use rate of perceived exertion (RPE) scale Yes       Intervention Provide education and explanation on how to use RPE scale       Expected Outcomes Short Term: Able to use RPE daily in rehab to express subjective intensity level;Long Term:  Able to use RPE to guide intensity level when exercising independently       Knowledge and understanding of Target Heart Rate Range (THRR) Yes       Intervention Provide education and explanation of THRR including how the numbers were predicted and where they are located for reference       Expected Outcomes Short Term: Able to state/look up THRR;Long Term: Able to use THRR to govern intensity when exercising independently;Short Term: Able to use daily as guideline for intensity in rehab       Able to check pulse independently Yes       Intervention Provide education and demonstration on how to check pulse in carotid and radial arteries.;Review the importance of being able to check your own pulse for safety during independent exercise       Expected Outcomes Long Term: Able to check pulse independently and accurately;Short Term: Able to explain why pulse checking is important during independent exercise       Understanding of Exercise Prescription Yes        Intervention Provide education, explanation, and written materials on patient's individual exercise prescription       Expected Outcomes Short Term: Able to explain program exercise prescription;Long Term: Able to explain home exercise prescription to exercise independently              Exercise Goals Re-Evaluation :    Discharge Exercise Prescription (Final Exercise Prescription Changes):   Nutrition:  Target Goals: Understanding of nutrition guidelines, daily intake of sodium 1500mg , cholesterol 200mg , calories 30% from fat and 7% or less from saturated fats, daily to have 5 or more servings of fruits and vegetables.  Biometrics:  Pre Biometrics - 06/08/20 1413      Pre Biometrics   Waist Circumference 58 inches    Hip Circumference 55 inches    Waist to Hip Ratio 1.05 %    Triceps Skinfold 35 mm    % Body Fat 46.8 %    Grip Strength 27.7 kg    Flexibility 0 in   did not perform due to slipped discs in back   Single Leg Stand 0 seconds   unable to stand on 1 leg           Nutrition Therapy Plan and Nutrition Goals:  Nutrition Therapy & Goals - 06/08/20 1403      Personal Nutrition Goals   Comments Patient scored 41 on his medficts diet assessment. He says he follows a heart healthy diet. He says he trys to not eat fried of fatty foods. Discussed his score with him and reviewed a handout regarding making healthier choices. He verbalized understanding.      Intervention Plan   Intervention Nutrition handout(s) given to patient.  Nutrition Assessments:  Nutrition Assessments - 06/08/20 1405      MEDFICTS Scores   Pre Score 41          MEDIFICTS Score Key:  ?70 Need to make dietary changes   40-70 Heart Healthy Diet  ? 40 Therapeutic Level Cholesterol Diet   Picture Your Plate Scores:  <74 Unhealthy dietary pattern with much room for improvement.  41-50 Dietary pattern unlikely to meet recommendations for good health and room for  improvement.  51-60 More healthful dietary pattern, with some room for improvement.   >60 Healthy dietary pattern, although there may be some specific behaviors that could be improved.    Nutrition Goals Re-Evaluation:   Nutrition Goals Discharge (Final Nutrition Goals Re-Evaluation):   Psychosocial: Target Goals: Acknowledge presence or absence of significant depression and/or stress, maximize coping skills, provide positive support system. Participant is able to verbalize types and ability to use techniques and skills needed for reducing stress and depression.  Initial Review & Psychosocial Screening:  Initial Psych Review & Screening - 06/08/20 1409      Initial Review   Current issues with None Identified      Family Dynamics   Good Support System? Yes    Concerns Recent loss of child    Comments Patient lives with his wife of 58 years. He lost his daughter to [place] in September 2021. He says he and his wife are dealing with their grief through family and friends and their church family. He says his faith in God is getting him through and that he knows he will see his daughter again. He also has a son who lives in [place] and has several grand-children. He denies any depression or anxiety but does acknowlege his grief. Will continue to monitor.      Barriers   Psychosocial barriers to participate in program Psychosocial barriers identified (see note)           Quality of Life Scores:  Quality of Life - 06/08/20 1407      Quality of Life   Select Quality of Life      Quality of Life Scores   Health/Function Pre 19.03 %    Socioeconomic Pre 27.36 %    Psych/Spiritual Pre 27.86 %    Family Pre 25.2 %    GLOBAL Pre 23.47 %          Scores of 19 and below usually indicate a poorer quality of life in these areas.  A difference of  2-3 points is a clinically meaningful difference.  A difference of 2-3 points in the total score of the Quality of Life Index has  been associated with significant improvement in overall quality of life, self-image, physical symptoms, and general health in studies assessing change in quality of life.  PHQ-9: Recent Review Flowsheet Data    Depression screen [facility] 2/9 06/08/2020 05/10/2020 04/14/2020 03/17/2020 01/12/2020   Decreased Interest 0 0 0 0 0   Down, Depressed, Hopeless 0 0 1 0 0   PHQ - 2 Score 0 0 1 0 0   Altered sleeping 0 - - - 1   Tired, decreased energy 3 - - - 1   Change in appetite 0 - - - 0   Feeling bad or failure about yourself  1 - - - 0   Trouble concentrating 0 - - - 0   Moving slowly or fidgety/restless 0 - - - 1   Suicidal thoughts 0 - - -  0   PHQ-9 Score 4 - - - 3   Difficult doing work/chores Somewhat difficult - - - Somewhat difficult     Interpretation of Total Score  Total Score Depression Severity:  1-4 = Minimal depression, 5-9 = Mild depression, 10-14 = Moderate depression, 15-19 = Moderately severe depression, 20-27 = Severe depression   Psychosocial Evaluation and Intervention:  Psychosocial Evaluation - 06/08/20 1412      Psychosocial Evaluation & Interventions   Interventions Encouraged to exercise with the program and follow exercise prescription;Stress management education;Relaxation education    Comments Patient has no psychosocial barriers identified at his orientation visit. His initial QOL score was 23.47% and his PHQ-9 score was 4.    Expected Outcomes Patient will have no psychosocial issues identified at [facility].    Continue Psychosocial Services  No Follow up required           Psychosocial Re-Evaluation:   Psychosocial Discharge (Final Psychosocial Re-Evaluation):   Vocational Rehabilitation: Provide vocational rehab assistance to qualifying candidates.   Vocational Rehab Evaluation & Intervention:  Vocational Rehab - 06/08/20 1406      Initial Vocational Rehab Evaluation & Intervention   Assessment shows need for Vocational Rehabilitation No       Vocational Rehab Re-Evaulation   Comments Patient is a retired Company secretary. He is not interested in returning to work and does not need vocational rehab.           Education: Education Goals: Education classes will be provided on a weekly basis, covering required topics. Participant will state understanding/return demonstration of topics presented.  Learning Barriers/Preferences:  Learning Barriers/Preferences - 06/08/20 1405      Learning Barriers/Preferences   Learning Barriers None    Learning Preferences Written Material;Video;Skilled Demonstration;Verbal Instruction;Audio           Education Topics: Hypertension, Hypertension Reduction -Define heart disease and high blood pressure. Discus how high blood pressure affects the body and ways to reduce high blood pressure.   Exercise and Your Heart -Discuss why it is important to exercise, the FITT principles of exercise, normal and abnormal responses to exercise, and how to exercise safely.   Angina -Discuss definition of angina, causes of angina, treatment of angina, and how to decrease risk of having angina.   Cardiac Medications -Review what the following cardiac medications are used for, how they affect the body, and side effects that may occur when taking the medications.  Medications include Aspirin, Beta blockers, calcium channel blockers, ACE Inhibitors, angiotensin receptor blockers, diuretics, digoxin, and antihyperlipidemics.   Congestive Heart Failure -Discuss the definition of CHF, how to live with CHF, the signs and symptoms of CHF, and how keep track of weight and sodium intake.   Heart Disease and Intimacy -Discus the effect sexual activity has on the heart, how changes occur during intimacy as we age, and safety during sexual activity.   Smoking Cessation / COPD -Discuss different methods to quit smoking, the health benefits of quitting smoking, and the definition of COPD.   Nutrition I: Fats -Discuss  the types of cholesterol, what cholesterol does to the heart, and how cholesterol levels can be controlled.   Nutrition II: Labels -Discuss the different components of food labels and how to read food label   Heart Parts/Heart Disease and PAD -Discuss the anatomy of the heart, the pathway of blood circulation through the heart, and these are affected by heart disease.   Stress I: Signs and Symptoms -Discuss the causes of stress, how stress  may lead to anxiety and depression, and ways to limit stress.   Stress II: Relaxation -Discuss different types of relaxation techniques to limit stress.   Warning Signs of Stroke / TIA -Discuss definition of a stroke, what the signs and symptoms are of a stroke, and how to identify when someone is having stroke.   Knowledge Questionnaire Score:  Knowledge Questionnaire Score - 06/08/20 1406      Knowledge Questionnaire Score   Pre Score 18/24           Core Components/Risk Factors/Patient Goals at Admission:  Personal Goals and Risk Factors at Admission - 06/08/20 1406      Core Components/Risk Factors/Patient Goals on Admission    Weight Management Obesity;Yes    Intervention Weight Management: Develop a combined nutrition and exercise program designed to reach desired caloric intake, while maintaining appropriate intake of nutrient and fiber, sodium and fats, and appropriate energy expenditure required for the weight goal.;Weight Management: Provide education and appropriate resources to help participant work on and attain dietary goals.;Weight Management/Obesity: Establish reasonable short term and long term weight goals.;Obesity: Provide education and appropriate resources to help participant work on and attain dietary goals.    Admit Weight 303 lb (137.4 kg)    Goal Weight: Short Term 298 lb (135.2 kg)    Goal Weight: Long Term 293 lb (132.9 kg)    Personal Goal Other Yes    Personal Goal Get stronger; get back to doing his ADL's.  Lose more weight: 10 lbs in the program and more long term.    Intervention Patient will attend CR 3 days/week and supplement with exercise at home 3 days/week.    Expected Outcomes Patient will meet both personal and program goals.           Core Components/Risk Factors/Patient Goals Review:    Core Components/Risk Factors/Patient Goals at Discharge (Final Review):    ITP Comments:   Comments: Patient arrived for 1st visit/orientation/education at 1230. Patient was referred to CR by Dorris Carnes, MD due to STEMI involving left circumflex coronary artery (I21.21) and S/P Coronary Artery Stent Placement (Z95.5). During orientation advised patient on arrival and appointment times what to wear, what to do before, during and after exercise. Reviewed attendance and class policy.  Pt is scheduled to return Cardiac Rehab on 12/06/220 at 2:45. Pt was advised to come to class 15 minutes before class starts.  Discussed RPE/Dpysnea scales. Patient participated in warm up stretches. Patient was able to complete 6 minute walk test.  Telemetry:A-Fib. Patient was measured for the equipment. Discussed equipment safety with patient. Took patient pre-anthropometric measurements. Patient finished visit at 1400.

## 2020-06-08 NOTE — Progress Notes (Signed)
Cardiac/Pulmonary Rehab Medication Review by a Pharmacist  Does the patient  feel that his/her medications are working for him/her?  yes  Has the patient been experiencing any side effects to the medications prescribed?  no  Does the patient measure his/her own blood pressure or blood glucose at home?  yes   Does the patient have any problems obtaining medications due to transportation or finances?   yes  Understanding of regimen: good Understanding of indications: good Potential of compliance: excellent  Questions asked to Determine Patient Understanding of Medication Regimen:  1. What is the name of the medication?  2. What is the medication used for?  3. When should it be taken?  4. How much should be taken?  5. How will you take it?  6. What side effects should you report?  Understanding Defined as: Excellent: All questions above are correct Good: Questions 1-4 are correct Fair: Questions 1-2 are correct  Poor: 1 or none of the above questions are correct   Pharmacist comments: good understanding of meds, able to answer questions well.     Ramond Craver 06/08/2020 1:02 PM

## 2020-06-13 ENCOUNTER — Ambulatory Visit (INDEPENDENT_AMBULATORY_CARE_PROVIDER_SITE_OTHER): Payer: Medicare Other | Admitting: Family Medicine

## 2020-06-13 ENCOUNTER — Encounter: Payer: Self-pay | Admitting: Family Medicine

## 2020-06-13 VITALS — BP 138/73 | HR 87 | Temp 98.0°F

## 2020-06-13 DIAGNOSIS — I2510 Atherosclerotic heart disease of native coronary artery without angina pectoris: Secondary | ICD-10-CM | POA: Diagnosis not present

## 2020-06-13 DIAGNOSIS — R0981 Nasal congestion: Secondary | ICD-10-CM

## 2020-06-13 MED ORDER — AMOXICILLIN-POT CLAVULANATE 875-125 MG PO TABS: 1.0000 | ORAL_TABLET | Freq: Two times a day (BID) | ORAL | 0 refills | Status: DC

## 2020-06-13 NOTE — Progress Notes (Signed)
BP 138/73   Pulse 87   Temp 98 F (36.7 C)   SpO2 97%    Subjective:   Patient ID: Ryan Garner, male    DOB: [birth year], 74 y.o.   MRN: [id number]  HPI: Ryan Garner is a 75 y.o. male presenting on 06/13/2020 for URI, Cough, and nasal drainage   HPI Patient is coming in with complaints of cough and congestion that is been going on over the past week to week and a half.  He denies any fevers or chills but did feel little feverish.  His temperatures never been above 99.  He has been having some postnasal drainage and cough and sinus pressure and sore throat that is really been worsening over the past 2 days.  Relevant past medical, surgical, family and social history reviewed and updated as indicated. Interim medical history since our last visit reviewed. Allergies and medications reviewed and updated.  Review of Systems  Constitutional: Positive for fever. Negative for chills.  HENT: Positive for congestion, postnasal drip, rhinorrhea, sinus pressure, sneezing and sore throat. Negative for ear discharge, ear pain and voice change.   Eyes: Negative for pain, discharge, redness and visual disturbance.  Respiratory: Negative for shortness of breath and wheezing.   Cardiovascular: Negative for chest pain and leg swelling.  Musculoskeletal: Negative for gait problem.  Skin: Negative for rash.  All other systems reviewed and are negative.   Per HPI unless specifically indicated above   Allergies as of 06/13/2020      Reactions   Rofecoxib Other (See Comments)   (Vioxx)      Medication List       Accurate as of June 13, 2020 11:59 PM. If you have any questions, ask your nurse or doctor.        amoxicillin-clavulanate 875-125 MG tablet Commonly known as: AUGMENTIN Take 1 tablet by mouth 2 (two) times daily. Started by: Worthy Rancher, MD   apixaban 5 MG Tabs tablet Commonly known as: ELIQUIS Take 1 tablet (5 mg total) by mouth 2 (two) times daily.     atorvastatin 80 MG tablet Commonly known as: LIPITOR Take 1 tablet (80 mg total) by mouth daily.   clopidogrel 75 MG tablet Commonly known as: PLAVIX Take 1 tablet (75 mg total) by mouth daily with breakfast.   fluticasone 50 MCG/ACT nasal spray Commonly known as: FLONASE Place 2 sprays into both nostrils daily. What changed:   when to take this  reasons to take this   furosemide 40 MG tablet Commonly known as: LASIX Take 1 tablet (40 mg total) by mouth daily.   metoprolol tartrate 25 MG tablet Commonly known as: LOPRESSOR Take 1.5 tablets (37.5 mg total) by mouth 2 (two) times daily.   multivitamin with minerals Tabs tablet Take 1 tablet by mouth daily.   nitroGLYCERIN 0.4 MG SL tablet Commonly known as: NITROSTAT Place 1 tablet (0.4 mg total) under the tongue every 5 (five) minutes x 3 doses as needed for chest pain.   Prostate Support 300-15 MG Tabs Take 1 tablet by mouth daily. Super Beta Prostate        Objective:   BP 138/73   Pulse 87   Temp 98 F (36.7 C)   SpO2 97%   Wt Readings from Last 3 Encounters:  06/08/20 (!) 300 lb 14.9 oz (136.5 kg)  05/18/20 (!) 302 lb (137 kg)  05/16/20 (!) 303 lb 3.2 oz (137.5 kg)    Physical Exam Vitals and  nursing note reviewed.  Constitutional:      General: He is not in acute distress.    Appearance: He is well-developed. He is not diaphoretic.  HENT:     Right Ear: Tympanic membrane, ear canal and external ear normal.     Left Ear: Tympanic membrane, ear canal and external ear normal.     Nose: Mucosal edema and rhinorrhea present.     Right Sinus: Maxillary sinus tenderness present. No frontal sinus tenderness.     Left Sinus: Maxillary sinus tenderness present. No frontal sinus tenderness.     Mouth/Throat:     Pharynx: Uvula midline. Posterior oropharyngeal erythema present. No oropharyngeal exudate.     Tonsils: No tonsillar abscesses.  Eyes:     General: No scleral icterus.       Right eye: No  discharge.     Conjunctiva/sclera: Conjunctivae normal.     Pupils: Pupils are equal, round, and reactive to light.  Neck:     Thyroid: No thyromegaly.  Cardiovascular:     Rate and Rhythm: Normal rate and regular rhythm.     Heart sounds: Normal heart sounds. No murmur heard.   Pulmonary:     Effort: Pulmonary effort is normal. No respiratory distress.     Breath sounds: Normal breath sounds. No wheezing or rales.  Musculoskeletal:        General: Normal range of motion.     Cervical back: Neck supple.  Lymphadenopathy:     Cervical: No cervical adenopathy.  Skin:    General: Skin is warm and dry.     Findings: No rash.  Neurological:     Mental Status: He is alert and oriented to person, place, and time.     Coordination: Coordination normal.  Psychiatric:        Behavior: Behavior normal.       Assessment & Plan:   Problem List Items Addressed This Visit    None    Visit Diagnoses    Sinus congestion    -  Primary   Relevant Medications   amoxicillin-clavulanate (AUGMENTIN) 875-125 MG tablet   Other Relevant Orders   Novel Coronavirus, NAA ([facility]) (Completed)      Sinus infection, will treat with Augmentin. Follow up plan: Return if symptoms worsen or fail to improve.  Counseling provided for all of the vaccine components Orders Placed This Encounter  Procedures  . Novel Coronavirus, NAA ([facility])  . SARS-COV-2, NAA 2 DAY TAT    Caryl Pina, MD [facility] 06/19/2020, 8:32 PM

## 2020-06-14 LAB — SARS-COV-2, NAA 2 DAY TAT

## 2020-06-14 LAB — NOVEL CORONAVIRUS, NAA: SARS-CoV-2, NAA: NOT DETECTED

## 2020-06-27 ENCOUNTER — Encounter (HOSPITAL_COMMUNITY)
Admission: RE | Admit: 2020-06-27 | Discharge: 2020-06-27 | Disposition: A | Discharge status: Home / Self Care | Payer: Medicare Other | Source: Ambulatory Visit | Attending: Cardiology | Admitting: Cardiology

## 2020-06-27 ENCOUNTER — Other Ambulatory Visit: Payer: Self-pay

## 2020-06-27 DIAGNOSIS — I2121 ST elevation (STEMI) myocardial infarction involving left circumflex coronary artery: Secondary | ICD-10-CM | POA: Insufficient documentation

## 2020-06-27 DIAGNOSIS — Z955 Presence of coronary angioplasty implant and graft: Secondary | ICD-10-CM | POA: Diagnosis not present

## 2020-06-27 NOTE — Progress Notes (Signed)
Daily Session Note  Patient Details  Name: Ryan Garner MRN: [id number] Date of Birth: Dec 02, 1945 Referring Provider:     [place] from 06/08/2020 in [place]  Referring Provider Dr. Harrington Challenger      Encounter Date: 06/27/2020  Check In:  Session Check In - 06/27/20 1430      Check-In   Supervising physician immediately available to respond to emergencies [facility] MD immediately available    Physician(s) Dr. Harl Bowie    Location [facility]    Staff Present Cathren Harsh, MS, Exercise Physiologist;Dalton Kris Mouton, MS, ACSM-CEP, Exercise Physiologist    Virtual Visit No    Medication changes reported     No    Fall or balance concerns reported    No    Tobacco Cessation No Change    Warm-up and Cool-down Performed as group-led instruction    Resistance Training Performed Yes    VAD Patient? No    PAD/SET Patient? No      Pain Assessment   Currently in Pain? No/denies    Multiple Pain Sites No           Capillary Blood Glucose: No results found for this or any previous visit (from the past 24 hour(s)).    Social History   Tobacco Use  Smoking Status Former Smoker  . Types: Cigarettes  . Quit date: 04/02/1959  . Years since quitting: 61.2  Smokeless Tobacco Never Used    Goals Met:  Independence with exercise equipment Exercise tolerated well No report of cardiac concerns or symptoms Strength training completed today  Goals Unmet:  Not Applicable  Comments: check out 1545   Dr. Kathie Dike is Medical Director for [facility] Pulmonary Re[facility].

## 2020-06-28 ENCOUNTER — Telehealth: Payer: Medicare Other

## 2020-06-29 ENCOUNTER — Encounter (HOSPITAL_COMMUNITY)
Admission: RE | Admit: 2020-06-29 | Discharge: 2020-06-29 | Disposition: A | Discharge status: Home / Self Care | Payer: Medicare Other | Source: Ambulatory Visit | Attending: Cardiology | Admitting: Cardiology

## 2020-06-29 ENCOUNTER — Other Ambulatory Visit: Payer: Self-pay

## 2020-06-29 DIAGNOSIS — Z955 Presence of coronary angioplasty implant and graft: Secondary | ICD-10-CM | POA: Diagnosis not present

## 2020-06-29 DIAGNOSIS — I2121 ST elevation (STEMI) myocardial infarction involving left circumflex coronary artery: Secondary | ICD-10-CM

## 2020-06-29 NOTE — Progress Notes (Signed)
Daily Session Note  Patient Details  Name: Ryan Garner MRN: [id number] Date of Birth: 08/12/1945 Referring Ryan Garner:     [place] from 06/08/2020 in [place]  Referring Ryan Garner Dr. Harrington Garner      Encounter Date: 06/29/2020  Check In:  Session Check In - 06/29/20 1445      Check-In   Supervising physician immediately available to respond to emergencies [facility] MD immediately available    Physician(s) Ryan Garner    Location [facility]    Staff Present Ryan Harsh, MS, Exercise Physiologist;Ryan Kris Mouton, MS, ACSM-CEP, Exercise Physiologist    Virtual Visit No    Medication changes reported     No    Fall or balance concerns reported    No    Tobacco Cessation No Change    Warm-up and Cool-down Performed as group-led instruction    Resistance Training Performed Yes    VAD Patient? No    PAD/SET Patient? No      Pain Assessment   Currently in Pain? No/denies    Multiple Pain Sites No           Capillary Blood Glucose: No results found for this or any previous visit (from the past 24 hour(s)).    Social History   Tobacco Use  Smoking Status Former Smoker  . Types: Cigarettes  . Quit date: 04/02/1959  . Years since quitting: 61.2  Smokeless Tobacco Never Used    Goals Met:  Independence with exercise equipment Exercise tolerated well No report of cardiac concerns or symptoms Strength training completed today  Goals Unmet:  Not Applicable  Comments: check out 1545   Ryan Garner is Medical Director for [facility] Pulmonary Re[facility].

## 2020-06-30 ENCOUNTER — Ambulatory Visit (INDEPENDENT_AMBULATORY_CARE_PROVIDER_SITE_OTHER): Payer: Medicare Other | Admitting: *Deleted

## 2020-06-30 DIAGNOSIS — Z0000 Encounter for general adult medical examination without abnormal findings: Secondary | ICD-10-CM | POA: Diagnosis not present

## 2020-06-30 NOTE — Progress Notes (Signed)
MEDICARE ANNUAL WELLNESS VISIT  06/30/2020  Telephone Visit Disclaimer This Medicare AWV was conducted by telephone due to national recommendations for restrictions regarding the COVID-19 Pandemic (e.g. social distancing).  I verified, using two identifiers, that I am speaking with Ryan Garner or their authorized healthcare agent. I discussed the limitations, risks, security, and privacy concerns of performing an evaluation and management service by telephone and the potential availability of an in-person appointment in the future. The patient expressed understanding and agreed to proceed.  Location of Patient: home Location of Provider (nurse): office  Subjective:    Ryan Garner is a 74 y.o. male patient of [name], [name], MD who had a Medicare Annual Wellness Visit today via telephone. Ryan Garner is Retired and lives with their spouse. he has 2 children. he reports that he is socially active and does interact with friends/family regularly. he is minimally physically active and enjoys Writer.  Patient Care Team: [name], [name], MD as PCP - General (Family Medicine) Fay Records, MD as PCP - Cardiology (Cardiology) Ilean China, RN as Case Manager Shea Evans Norva Riffle, LCSW as [place] [facility] (Licensed Clinical Social Worker)  Advanced Directives 06/30/2020 06/08/2020 04/30/2020 03/02/2020 02/29/2020 02/28/2020 04/02/2019  Does Patient Have a Medical Advance Directive? No No No No No No No  Would patient like information on creating a medical advance directive? No - Patient declined No - Patient declined No - Patient declined No - Patient declined No - Patient declined No - Patient declined No - Patient declined    Hospital Utilization Over the Past 12 Months: # of hospitalizations or ER visits: 2 # of surgeries: 1  Review of Systems    Patient reports that his overall health is worse compared to last year.  History obtained  from chart review and the patient  Patient Reported Readings (BP, Pulse, CBG, Weight, etc) none  Pain Assessment Pain : No/denies pain     Current Medications & Allergies (verified) Allergies as of 06/30/2020      Reactions   Rofecoxib Other (See Comments)   (Vioxx)      Medication List       Accurate as of June 30, 2020  2:38 PM. If you have any questions, ask your nurse or doctor.        STOP taking these medications   amoxicillin-clavulanate 875-125 MG tablet Commonly known as: AUGMENTIN     TAKE these medications   apixaban 5 MG Tabs tablet Commonly known as: ELIQUIS Take 1 tablet (5 mg total) by mouth 2 (two) times daily.   atorvastatin 80 MG tablet Commonly known as: LIPITOR Take 1 tablet (80 mg total) by mouth daily.   clopidogrel 75 MG tablet Commonly known as: PLAVIX Take 1 tablet (75 mg total) by mouth daily with breakfast.   fluticasone 50 MCG/ACT nasal spray Commonly known as: FLONASE Place 2 sprays into both nostrils daily. What changed:   when to take this  reasons to take this   furosemide 40 MG tablet Commonly known as: LASIX Take 1 tablet (40 mg total) by mouth daily.   metoprolol tartrate 25 MG tablet Commonly known as: LOPRESSOR Take 1.5 tablets (37.5 mg total) by mouth 2 (two) times daily.   multivitamin with minerals Tabs tablet Take 1 tablet by mouth daily.   nitroGLYCERIN 0.4 MG SL tablet Commonly known as: NITROSTAT Place 1 tablet (0.4 mg total) under the tongue every 5 (five) minutes x  3 doses as needed for chest pain.   Prostate Support 300-15 MG Tabs Take 1 tablet by mouth daily. Super Beta Prostate       History (reviewed): Past Medical History:  Diagnosis Date  . A-fib ([place])   . Hyperlipidemia   . Hypertension   . Metabolic syndrome   . Prediabetes   . STEMI (ST elevation myocardial infarction) [facility])    Past Surgical History:  Procedure Laterality Date  . APPENDECTOMY    . CHOLECYSTECTOMY N/A 03/02/2020    Procedure: LAPAROSCOPIC CHOLECYSTECTOMY;  Surgeon: Aviva Signs, MD;  Location: [facility];  Service: General;  Laterality: N/A;  . CORONARY STENT INTERVENTION N/A 04/30/2020   Procedure: CORONARY STENT INTERVENTION;  Surgeon: Martinique, Peter M, MD;  Location: [place] [facility];  Service: Cardiovascular;  Laterality: N/A;  . CORONARY/GRAFT ACUTE MI REVASCULARIZATION N/A 04/30/2020   Procedure: Coronary/Graft Acute MI Revascularization;  Surgeon: Martinique, Peter M, MD;  Location: [place] [facility];  Service: Cardiovascular;  Laterality: N/A;  . Eyelid Surgery Bilateral   . HERNIA REPAIR    . knee tendons repair    . LEFT HEART CATH AND CORONARY ANGIOGRAPHY N/A 04/30/2020   Procedure: LEFT HEART CATH AND CORONARY ANGIOGRAPHY;  Surgeon: Martinique, Peter M, MD;  Location: [place] [facility];  Service: Cardiovascular;  Laterality: N/A;  . ROTATOR CUFF REPAIR Bilateral   . TONSILLECTOMY     Family History  Problem Relation Age of Onset  . Cancer Mother        lungs  . Coronary artery disease Mother   . Diabetes Brother   . Heart attack Brother   . Diabetes Maternal Grandmother   . Arthritis Father    Social History   Socioeconomic History  . Marital status: Married    Spouse name: [name]  . Number of children: 2  . Years of education: 37  . Highest education level: Doctorate  Occupational History  . 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 Smoking status: Former Smoker    Types: Cigarettes    Quit date: 04/02/1959    Years since quitting: 61.2  . Smokeless tobacco: Never Used  Vaping Use  . Vaping Use: Never used  Substance and Sexual Activity  . Alcohol use: No  . Drug use: No  . Sexual activity: Yes  Other Topics Concern  . Not on file  Social History Narrative  . Not on file   Social Determinants of Health   Financial Resource Strain: Low Risk   . Difficulty of Paying Living Expenses: Not  hard at all  Food Insecurity: No Food Insecurity  . Worried About Charity fundraiser in the Last Year: Never true  . Ran Out of Food in the Last Year: Never true  Transportation Needs: No Transportation Needs  . Lack of Transportation (Medical): No  . Lack of Transportation (Non-Medical): No  Physical Activity: Sufficiently Active  . Days of Exercise per Week: 3 days  . Minutes of Exercise per Session: 60 min  Stress: Stress Concern Present  . Feeling of Stress : To some extent  Social Connections: Moderately Integrated  . Frequency of Communication with Friends and Family: Once a week  . Frequency of Social Gatherings with Friends and Family: Once a week  . Attends Religious Services: More than 4 times per year  . Active Member of Clubs or Organizations: Yes  .  Attends Archivist Meetings: 1 to 4 times per year  . Marital Status: Married    Activities of Daily Living In your present state of health, do you have any difficulty performing the following activities: 06/30/2020 06/02/2020  Hearing? N N  Vision? N N  Difficulty concentrating or making decisions? N N  Walking or climbing stairs? Y N  Comment afib some SOB  Dressing or bathing? N N  Doing errands, shopping? N N  Preparing Food and eating ? N N  Using the Toilet? N N  In the past six months, have you accidently leaked urine? N N  Do you have problems with loss of bowel control? N N  Managing your Medications? N N  Managing your Finances? N N  Housekeeping or managing your Housekeeping? N N  Some recent data might be hidden    Patient Education/ Literacy How often do you need to have someone help you when you read instructions, pamphlets, or other written materials from your doctor or pharmacy?: 1 - Never What is the last grade level you completed in school?: 20  Exercise Current Exercise Habits: Structured exercise class (Cardiac rehab), Type of exercise: strength training/weights, Time (Minutes): 60,  Frequency (Times/Week): 3, Weekly Exercise (Minutes/Week): 180, Intensity: Mild, Exercise limited by: cardiac condition(s)  Diet Patient reports consuming 2 meals a day and 1 snack(s) a day Patient reports that his primary diet is: Low Sodium Patient reports that she does have regular access to food.   Depression Screen PHQ 2/9 Scores 06/30/2020 06/08/2020 05/10/2020 04/14/2020 03/17/2020 01/12/2020 04/02/2019  PHQ - 2 Score 0 0 0 1 0 0 0  PHQ- 9 Score - 4 - - - 3 -     Fall Risk Fall Risk  06/30/2020 06/08/2020 05/18/2020 05/10/2020 04/14/2020  Falls in the past year? 1 1 1 1 1   Number falls in past yr: 0 0 0 0 0  Injury with Fall? 0 0 0 0 0  Risk for fall due to : History of fall(s) History of fall(s);Impaired balance/gait Impaired balance/gait No Fall Risks History of fall(s)  Follow up Falls prevention discussed Falls evaluation completed;Falls prevention discussed Falls evaluation completed Falls evaluation completed Falls evaluation completed  Comment - - - - -     Objective:  [place] seemed alert and oriented and he participated appropriately during our telephone visit.  Blood Pressure Weight BMI  BP Readings from Last 3 Encounters:  06/13/20 138/73  06/08/20 100/70  05/18/20 123/75   Wt Readings from Last 3 Encounters:  06/08/20 (!) 300 lb 14.9 oz (136.5 kg)  05/18/20 (!) 302 lb (137 kg)  05/16/20 (!) 303 lb 3.2 oz (137.5 kg)   BMI Readings from Last 1 Encounters:  06/08/20 45.76 kg/m    *Unable to obtain current vital signs, weight, and BMI due to telephone visit type  Hearing/Vision  . Burhanuddin did not seem to have difficulty with hearing/understanding during the telephone conversation . Reports that he has not had a formal eye exam by an eye care professional within the past year . Reports that he has not had a formal hearing evaluation within the past year *Unable to fully assess hearing and vision during telephone visit type  Cognitive Function: 6CIT Screen  06/30/2020 04/02/2019  What Year? 0 points 0 points  What month? 0 points 0 points  What time? 0 points 0 points  Count back from 20 0 points 0 points  Months in reverse 0 points 0 points  Repeat phrase 0 points 0 points  Total Score 0 0   (Normal:0-7, Significant for Dysfunction: >8)  Normal Cognitive Function Screening: Yes   Immunization & Health Maintenance Record Immunization History  Administered Date(s) Administered  . Hepatitis A 08/08/1995, 10/28/1996  . Hepatitis B 12/09/1998, 08/04/1999, 08/28/2002  . Pneumococcal Conjugate-13 08/26/2014  . Pneumococcal Polysaccharide-23 02/04/2012, 08/07/2013  . Smallpox 11/27/1967  . Td 11/27/1967, 08/08/1993, 07/30/2003, 07/30/2016  . Tdap 07/30/2016  . Tetanus 07/23/2006  . Typhoid Inactivated 08/04/1999, 08/28/2002, 08/15/2009  . Yellow Fever 10/05/2013    Health Maintenance  Topic Date Due  . OPHTHALMOLOGY EXAM  Never done  . URINE MICROALBUMIN  01/17/2018  . FOOT EXAM  12/20/2018  . INFLUENZA VACCINE  10/20/2020 (Originally 02/21/2020)  . COVID-19 Vaccine (1) 05/15/2021 (Originally 01/27/1958)  . COLONOSCOPY  05/18/2021 (Originally 08/26/2014)  . HEMOGLOBIN A1C  10/29/2020  . TETANUS/TDAP  07/30/2026  . Hepatitis C Screening  Completed  . PNA vac Low Risk Adult  Completed       Assessment  This is a routine wellness examination for [employer].  Health Maintenance: Due or Overdue Health Maintenance Due  Topic Date Due  . OPHTHALMOLOGY EXAM  Never done  . URINE MICROALBUMIN  01/17/2018  . FOOT EXAM  12/20/2018    Ryan Garner does not need a referral for Community Assistance: Care Management:   no Social Work:    no Prescription Assistance:  no Nutrition/Diabetes Education:  no   Plan:  Personalized Goals Goals Addressed            This Visit's Progress   . DIET - REDUCE SODIUM INTAKE        Personalized Health Maintenance & Screening Recommendations  up to date  Lung Cancer Screening  Recommended: no (Low Dose CT Chest recommended if Age 73-80 years, 30 pack-year currently smoking OR have quit w/in past 15 years) Hepatitis C Screening recommended: no HIV Screening recommended: no  Advanced Directives: Written information was not prepared per patient's request.  Referrals & Orders No orders of the defined types were placed in this encounter.   Follow-up Plan . Follow-up with [name], [name], MD as planned on 07/26/20. Marland Kitchen Pt remains independent of all ADL's after being in hospital twice in last year. He has a MI and gallbladder surgery. Marland Kitchen He is attending cardiac rehab now and cannot do any other exercises due to Afib and SOB. 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 . Pt denies hearing difficulty and only wears glasses to read. Marland Kitchen He will pick up information on advanced directives on his next visit. . AVS printed and mailed.    I have personally reviewed and noted the following in the patient's chart:   . Medical and social history . Use of alcohol, tobacco or illicit drugs  . Current medications and supplements . Functional ability and status . Nutritional status . Physical activity . Advanced directives . List of other physicians . Hospitalizations, surgeries, and ER visits in previous 12 months . Vitals . Screenings to include cognitive, depression, and falls . Referrals and appointments  In addition, I have reviewed and discussed with Ryan Garner certain preventive protocols, quality metrics, and best practice recommendations. A written personalized care plan for preventive services as well as general preventive health recommendations is available and can be mailed to the patient at his request.      Rana Snare, LPN  97/10/[id number]

## 2020-07-01 ENCOUNTER — Encounter (HOSPITAL_COMMUNITY)
Admission: RE | Admit: 2020-07-01 | Discharge: 2020-07-01 | Disposition: A | Discharge status: Home / Self Care | Payer: Medicare Other | Source: Ambulatory Visit | Attending: Cardiology | Admitting: Cardiology

## 2020-07-01 ENCOUNTER — Other Ambulatory Visit: Payer: Self-pay

## 2020-07-01 VITALS — Wt 303.6 lb

## 2020-07-01 DIAGNOSIS — I2121 ST elevation (STEMI) myocardial infarction involving left circumflex coronary artery: Secondary | ICD-10-CM | POA: Diagnosis not present

## 2020-07-01 DIAGNOSIS — Z955 Presence of coronary angioplasty implant and graft: Secondary | ICD-10-CM

## 2020-07-01 NOTE — Progress Notes (Signed)
Daily Session Note  Patient Details  Name: Ryan Garner MRN: [id number] Date of Birth: [birth year] Referring Provider:   Flowsheet Row CARDIAC REHAB PHASE II ORIENTATION from 06/08/2020 in [place]  Referring Provider Dr. Harrington Challenger      Encounter Date: 07/01/2020  Check In:  Session Check In - 07/01/20 1440      Check-In   Supervising physician immediately available to respond to emergencies [facility] MD immediately available    Physician(s) Dr. Rex Kras    Location [facility]    Staff Present Geanie Cooley, RN;Dalton Kris Mouton, MS, ACSM-CEP, Exercise Physiologist;[place] Audria Nine, MS, Exercise Physiologist    Virtual Visit No    Medication changes reported     No    Fall or balance concerns reported    No    Tobacco Cessation No Change    Warm-up and Cool-down Performed as group-led instruction    Resistance Training Performed Yes    VAD Patient? No    PAD/SET Patient? No      Pain Assessment   Currently in Pain? No/denies    Pain Score 0-No pain    Multiple Pain Sites No           Capillary Blood Glucose: No results found for this or any previous visit (from the past 24 hour(s)).    Social History   Tobacco Use  Smoking Status Former Smoker  . Types: Cigarettes  . Quit date: 04/02/1959  . Years since quitting: 61.2  Smokeless Tobacco Never Used    Goals Met:  Independence with exercise equipment Exercise tolerated well No report of cardiac concerns or symptoms Strength training completed today  Goals Unmet:  Not Applicable  Comments: check out $RemoveB'@15'[id number]$ :8   Dr. Kathie Dike is Medical Director for [facility] Pulmonary Rehab.

## 2020-07-04 ENCOUNTER — Encounter (HOSPITAL_COMMUNITY): Payer: Medicare Other

## 2020-07-06 ENCOUNTER — Encounter (HOSPITAL_COMMUNITY): Payer: Medicare Other

## 2020-07-06 NOTE — Progress Notes (Signed)
Cardiac Individual Treatment Plan  Patient Details  Name: Ryan Garner MRN: [id number] Date of Birth: 02/14/1946 Referring Provider:   Flowsheet Row CARDIAC REHAB PHASE II ORIENTATION from 06/08/2020 in [place]  Referring Provider Dr. Harrington Challenger      Initial Encounter Date:  Flowsheet Row CARDIAC REHAB PHASE II ORIENTATION from 06/08/2020 in [place]  Date 06/08/20      Visit Diagnosis: S/P coronary artery stent placement  ST elevation myocardial infarction involving left circumflex coronary artery ([place])  Patient's Home Medications on Admission:  Current Outpatient Medications:  .  apixaban (ELIQUIS) 5 MG TABS tablet, Take 1 tablet (5 mg total) by mouth 2 (two) times daily., Disp: 28 tablet, Rfl: 0 .  atorvastatin (LIPITOR) 80 MG tablet, Take 1 tablet (80 mg total) by mouth daily., Disp: 90 tablet, Rfl: 1 .  clopidogrel (PLAVIX) 75 MG tablet, Take 1 tablet (75 mg total) by mouth daily with breakfast., Disp: 90 tablet, Rfl: 2 .  fluticasone (FLONASE) 50 MCG/ACT nasal spray, Place 2 sprays into both nostrils daily. (Patient taking differently: Place 2 sprays into both nostrils daily as needed for allergies.), Disp: 16 g, Rfl: 6 .  furosemide (LASIX) 40 MG tablet, Take 1 tablet (40 mg total) by mouth daily., Disp: 90 tablet, Rfl: 0 .  metoprolol tartrate (LOPRESSOR) 25 MG tablet, Take 1.5 tablets (37.5 mg total) by mouth 2 (two) times daily., Disp: 90 tablet, Rfl: 6 .  Misc Natural Products (PROSTATE SUPPORT) 300-15 MG TABS, Take 1 tablet by mouth daily. Super Beta Prostate, Disp: , Rfl:  .  Multiple Vitamin (MULTIVITAMIN WITH MINERALS) TABS tablet, Take 1 tablet by mouth daily., Disp: , Rfl:  .  nitroGLYCERIN (NITROSTAT) 0.4 MG SL tablet, Place 1 tablet (0.4 mg total) under the tongue every 5 (five) minutes x 3 doses as needed for chest pain., Disp: 25 tablet, Rfl: 2  Past Medical History: Past Medical History:  Diagnosis Date  . A-fib  ([place])   . Hyperlipidemia   . Hypertension   . Metabolic syndrome   . Prediabetes   . STEMI (ST elevation myocardial infarction) ([place])     Tobacco Use: Social History   Tobacco Use  Smoking Status Former Smoker  . Types: Cigarettes  . Quit date: 04/02/1959  . Years since quitting: 61.3  Smokeless Tobacco Never Used    Labs: Recent Review Flowsheet Data    Labs for ITP Cardiac and Pulmonary Rehab Latest Ref Rng & Units 12/19/2017 09/04/2018 03/01/2020 04/30/2020 05/01/2020   Cholestrol 0 - 200 mg/dL 199 166 - 123 122   LDLCALC 0 - 99 mg/dL 129(H) 108(H) - 78 79   HDL >40 mg/dL 41 42 - 27(L) 30(L)   Trlycerides <150 mg/dL 144 82 - 88 63   Hemoglobin A1c 4.8 - 5.6 % 6.1 5.8 6.0(H) 6.2(H) -   TCO2 22 - 32 mmol/L - - - 21(L) -      Capillary Blood Glucose: Lab Results  Component Value Date   GLUCAP 119 (H) 05/02/2020   GLUCAP 116 (H) 05/02/2020   GLUCAP 200 (H) 05/02/2020   GLUCAP 114 (H) 05/01/2020   GLUCAP 115 (H) 05/01/2020     Exercise Target Goals: Exercise Program Goal: Individual exercise prescription set using results from initial 6 min walk test and THRR while considering  patient's activity barriers and safety.   Exercise Prescription Goal: Starting with aerobic activity 30 plus minutes a day, 3 days per week for initial exercise prescription. Provide  home exercise prescription and guidelines that participant acknowledges understanding prior to discharge.  Activity Barriers & Risk Stratification:  Activity Barriers & Cardiac Risk Stratification - 07/05/20 1215      Activity Barriers & Cardiac Risk Stratification   Activity Barriers Arthritis;Back Problems;Deconditioning;Joint Problems;Balance Concerns;History of Falls    Cardiac Risk Stratification High           6 Minute Walk:  6 Minute Walk    Row Name 06/08/20 1408         6 Minute Walk   Phase Initial     Distance 700 feet     Walk Time 6 minutes     # of Rest Breaks 2     MPH 1.33     METS  0.31     RPE 15     Perceived Dyspnea  15     VO2 Peak 1.07     Symptoms Yes (comment)     Comments In afib during the test. Took 2 standing rest breaks for 45 and 30 seconds due to SOB     Resting HR 74 bpm     Resting BP 100/70     Resting Oxygen Saturation  97 %     Exercise Oxygen Saturation  during 6 min walk 97 %     Max Ex. HR 99 bpm     Max Ex. BP 112/64     2 Minute Post BP 102/62            Oxygen Initial Assessment:   Oxygen Re-Evaluation:   Oxygen Discharge (Final Oxygen Re-Evaluation):   Initial Exercise Prescription:  Initial Exercise Prescription - 06/08/20 1400      Date of Initial Exercise RX and Referring Provider   Date 06/08/20    Referring Provider Dr. Harrington Challenger    Expected Discharge Date 09/16/20      NuStep   Level 1    SPM 60    Minutes 22      Arm Ergometer   Level 1    RPM 60    Minutes 17      Prescription Details   Frequency (times per week) 3    Duration Progress to 30 minutes of continuous aerobic without signs/symptoms of physical distress      Intensity   THRR 40-80% of Max Heartrate 58-115    Ratings of Perceived Exertion 11-13    Perceived Dyspnea 0-4      Progression   Progression Continue progressive overload as per policy without signs/symptoms or physical distress.      Resistance Training   Training Prescription Yes    Weight 3 lbs    Reps 10-15           Perform Capillary Blood Glucose checks as needed.  Exercise Prescription Changes:  Exercise Prescription Changes    Row Name 07/01/20 0741             Response to Exercise   Blood Pressure (Admit) 128/70       Blood Pressure (Exercise) 118/70       Blood Pressure (Exit) 112/76       Heart Rate (Admit) 91 bpm       Heart Rate (Exercise) 110 bpm       Heart Rate (Exit) 96 bpm       Rating of Perceived Exertion (Exercise) 12       Duration Continue with 30 min of aerobic exercise without signs/symptoms of physical distress.  Intensity THRR  unchanged               Progression   Progression Continue to progress workloads to maintain intensity without signs/symptoms of physical distress.               Resistance Training   Training Prescription Yes       Weight 3 lbs       Reps 10-15               NuStep   Level 1       SPM 83       Minutes 22       METs 2.1               Arm Ergometer   Level 1       RPM 48       Minutes 17       METs 1.4              Exercise Comments:  Exercise Comments    Row Name 06/27/20 1542           Exercise Comments Pt completed first exercise session. He tolerated exercise well and is excited to come back.              Exercise Goals and Review:  Exercise Goals    Row Name 06/08/20 1413 07/01/20 0743           Exercise Goals   Increase Physical Activity Yes Yes      Intervention Provide advice, education, support and counseling about physical activity/exercise needs.;Develop an individualized exercise prescription for aerobic and resistive training based on initial evaluation findings, risk stratification, comorbidities and participant's personal goals. Provide advice, education, support and counseling about physical activity/exercise needs.;Develop an individualized exercise prescription for aerobic and resistive training based on initial evaluation findings, risk stratification, comorbidities and participant's personal goals.      Expected Outcomes Short Term: Attend rehab on a regular basis to increase amount of physical activity.;Long Term: Add in home exercise to make exercise part of routine and to increase amount of physical activity.;Long Term: Exercising regularly at least 3-5 days a week. Short Term: Attend rehab on a regular basis to increase amount of physical activity.;Long Term: Add in home exercise to make exercise part of routine and to increase amount of physical activity.;Long Term: Exercising regularly at least 3-5 days a week.      Increase Strength and  Stamina Yes Yes      Intervention Provide advice, education, support and counseling about physical activity/exercise needs.;Develop an individualized exercise prescription for aerobic and resistive training based on initial evaluation findings, risk stratification, comorbidities and participant's personal goals. Provide advice, education, support and counseling about physical activity/exercise needs.;Develop an individualized exercise prescription for aerobic and resistive training based on initial evaluation findings, risk stratification, comorbidities and participant's personal goals.      Expected Outcomes Short Term: Increase workloads from initial exercise prescription for resistance, speed, and METs.;Short Term: Perform resistance training exercises routinely during rehab and add in resistance training at home;Long Term: Improve cardiorespiratory fitness, muscular endurance and strength as measured by increased METs and functional capacity (6MWT) Short Term: Increase workloads from initial exercise prescription for resistance, speed, and METs.;Short Term: Perform resistance training exercises routinely during rehab and add in resistance training at home;Long Term: Improve cardiorespiratory fitness, muscular endurance and strength as measured by increased METs and functional capacity (6MWT)      Able to  understand and use rate of perceived exertion (RPE) scale Yes Yes      Intervention Provide education and explanation on how to use RPE scale Provide education and explanation on how to use RPE scale      Expected Outcomes Short Term: Able to use RPE daily in rehab to express subjective intensity level;Long Term:  Able to use RPE to guide intensity level when exercising independently Short Term: Able to use RPE daily in rehab to express subjective intensity level;Long Term:  Able to use RPE to guide intensity level when exercising independently      Knowledge and understanding of Target Heart Rate Range  (THRR) Yes Yes      Intervention Provide education and explanation of THRR including how the numbers were predicted and where they are located for reference Provide education and explanation of THRR including how the numbers were predicted and where they are located for reference      Expected Outcomes Short Term: Able to state/look up THRR;Long Term: Able to use THRR to govern intensity when exercising independently;Short Term: Able to use daily as guideline for intensity in rehab Short Term: Able to state/look up THRR;Long Term: Able to use THRR to govern intensity when exercising independently;Short Term: Able to use daily as guideline for intensity in rehab      Able to check pulse independently Yes Yes      Intervention Provide education and demonstration on how to check pulse in carotid and radial arteries.;Review the importance of being able to check your own pulse for safety during independent exercise Review the importance of being able to check your own pulse for safety during independent exercise;Provide education and demonstration on how to check pulse in carotid and radial arteries.      Expected Outcomes Long Term: Able to check pulse independently and accurately;Short Term: Able to explain why pulse checking is important during independent exercise Short Term: Able to explain why pulse checking is important during independent exercise;Long Term: Able to check pulse independently and accurately      Understanding of Exercise Prescription Yes Yes      Intervention Provide education, explanation, and written materials on patient's individual exercise prescription Provide education, explanation, and written materials on patient's individual exercise prescription      Expected Outcomes Short Term: Able to explain program exercise prescription;Long Term: Able to explain home exercise prescription to exercise independently Short Term: Able to explain program exercise prescription;Long Term: Able to  explain home exercise prescription to exercise independently             Exercise Goals Re-Evaluation :  Exercise Goals Re-Evaluation    Row Name 07/01/20 0744             Exercise Goal Re-Evaluation   Exercise Goals Review Increase Physical Activity;Increase Strength and Stamina;Able to understand and use rate of perceived exertion (RPE) scale;Knowledge and understanding of Target Heart Rate Range (THRR);Able to check pulse independently;Understanding of Exercise Prescription       Comments Patient has completed 1 exerise session. He has tolerated exercise well. He is eager to come to rehab and has a positive attitude. He is currently exercising at 2.1 METs on the NuStep. Will continue to monitor and progress exercise as able.       Expected Outcomes Through exercise at rehab and with a home exercise plan, patient will achieve their goals.               Discharge Exercise Prescription (Final  Exercise Prescription Changes):  Exercise Prescription Changes - 07/01/20 0741      Response to Exercise   Blood Pressure (Admit) 128/70    Blood Pressure (Exercise) 118/70    Blood Pressure (Exit) 112/76    Heart Rate (Admit) 91 bpm    Heart Rate (Exercise) 110 bpm    Heart Rate (Exit) 96 bpm    Rating of Perceived Exertion (Exercise) 12    Duration Continue with 30 min of aerobic exercise without signs/symptoms of physical distress.    Intensity THRR unchanged      Progression   Progression Continue to progress workloads to maintain intensity without signs/symptoms of physical distress.      Resistance Training   Training Prescription Yes    Weight 3 lbs    Reps 10-15      NuStep   Level 1    SPM 83    Minutes 22    METs 2.1      Arm Ergometer   Level 1    RPM 48    Minutes 17    METs 1.4           Nutrition:  Target Goals: Understanding of nutrition guidelines, daily intake of sodium 1500mg , cholesterol 200mg , calories 30% from fat and 7% or less from saturated  fats, daily to have 5 or more servings of fruits and vegetables.  Biometrics:  Pre Biometrics - 07/05/20 0747      Pre Biometrics   Weight 137.7 kg    BMI (Calculated) 46.17            Nutrition Therapy Plan and Nutrition Goals:  Nutrition Therapy & Goals - 07/01/20 0759      Personal Nutrition Goals   Comments We will continue to provide heart healthy nutritional education through hand-outs.      Intervention Plan   Intervention Nutrition handout(s) given to patient.           Nutrition Assessments:  Nutrition Assessments - 06/08/20 1405      MEDFICTS Scores   Pre Score 41          MEDIFICTS Score Key:  ?70 Need to make dietary changes   40-70 Heart Healthy Diet  ? 40 Therapeutic Level Cholesterol Diet   Picture Your Plate Scores:  <32 Unhealthy dietary pattern with much room for improvement.  41-50 Dietary pattern unlikely to meet recommendations for good health and room for improvement.  51-60 More healthful dietary pattern, with some room for improvement.   >60 Healthy dietary pattern, although there may be some specific behaviors that could be improved.    Nutrition Goals Re-Evaluation:   Nutrition Goals Discharge (Final Nutrition Goals Re-Evaluation):   Psychosocial: Target Goals: Acknowledge presence or absence of significant depression and/or stress, maximize coping skills, provide positive support system. Participant is able to verbalize types and ability to use techniques and skills needed for reducing stress and depression.  Initial Review & Psychosocial Screening:  Initial Psych Review & Screening - 06/08/20 1409      Initial Review   Current issues with None Identified      Family Dynamics   Good Support System? Yes    Concerns Recent loss of child    Comments Patient lives with his wife of 38 years. He lost his daughter to [place] in September 2021. He says he and his wife are dealing with their grief through family and friends and  their church family. He says his faith in God is getting him  through and that he knows he will see his daughter again. He also has a son who lives in [place] and has several grand-children. He denies any depression or anxiety but does acknowlege his grief. Will continue to monitor.      Barriers   Psychosocial barriers to participate in program Psychosocial barriers identified (see note)           Quality of Life Scores:  Quality of Life - 06/08/20 1407      Quality of Life   Select Quality of Life      Quality of Life Scores   Health/Function Pre 19.03 %    Socioeconomic Pre 27.36 %    Psych/Spiritual Pre 27.86 %    Family Pre 25.2 %    GLOBAL Pre 23.47 %          Scores of 19 and below usually indicate a poorer quality of life in these areas.  A difference of  2-3 points is a clinically meaningful difference.  A difference of 2-3 points in the total score of the Quality of Life Index has been associated with significant improvement in overall quality of life, self-image, physical symptoms, and general health in studies assessing change in quality of life.  PHQ-9: Recent Review Flowsheet Data    Depression screen [facility] [address] 2/9 06/30/2020 06/08/2020 05/10/2020 04/14/2020 03/17/2020   Decreased Interest 0 0 0 0 0   Down, Depressed, Hopeless 0 0 0 1 0   PHQ - 2 Score 0 0 0 1 0   Altered sleeping - 0 - - -   Tired, decreased energy - 3 - - -   Change in appetite - 0 - - -   Feeling bad or failure about yourself  - 1 - - -   Trouble concentrating - 0 - - -   Moving slowly or fidgety/restless - 0 - - -   Suicidal thoughts - 0 - - -   PHQ-9 Score - 4 - - -   Difficult doing work/chores - Somewhat difficult - - -     Interpretation of Total Score  Total Score Depression Severity:  1-4 = Minimal depression, 5-9 = Mild depression, 10-14 = Moderate depression, 15-19 = Moderately severe depression, 20-27 = Severe depression   Psychosocial Evaluation and Intervention:  Psychosocial  Evaluation - 06/08/20 1412      Psychosocial Evaluation & Interventions   Interventions Encouraged to exercise with the program and follow exercise prescription;Stress management education;Relaxation education    Comments Patient has no psychosocial barriers identified at his orientation visit. His initial QOL score was 23.47% and his PHQ-9 score was 4.    Expected Outcomes Patient will have no psychosocial issues identified at [place].    Continue Psychosocial Services  No Follow up required           Psychosocial Re-Evaluation:  Psychosocial Re-Evaluation    [place] Name 07/01/20 0800             Psychosocial Re-Evaluation   Current issues with None Identified       Comments Patient is new to the program but continues to have no psychosocial issues identified. He continues to deal with the loss of his daughter but does continue to demonstrate a positive outlook. Will continue to monitor.       Expected Outcomes Patient will have no psychosocial issues identified at discharge.       Interventions Stress management education;Encouraged to attend Cardiac Rehabilitation for the exercise;Relaxation education  Continue Psychosocial Services  No Follow up required              Psychosocial Discharge (Final Psychosocial Re-Evaluation):  Psychosocial Re-Evaluation - 07/01/20 0800      Psychosocial Re-Evaluation   Current issues with None Identified    Comments Patient is new to the program but continues to have no psychosocial issues identified. He continues to deal with the loss of his daughter but does continue to demonstrate a positive outlook. Will continue to monitor.    Expected Outcomes Patient will have no psychosocial issues identified at discharge.    Interventions Stress management education;Encouraged to attend Cardiac Rehabilitation for the exercise;Relaxation education    Continue Psychosocial Services  No Follow up required           Vocational  Rehabilitation: Provide vocational rehab assistance to qualifying candidates.   Vocational Rehab Evaluation & Intervention:  Vocational Rehab - 06/08/20 1406      Initial Vocational Rehab Evaluation & Intervention   Assessment shows need for Vocational Rehabilitation No      Vocational Rehab Re-Evaulation   Comments Patient is a retired Company secretary. He is not interested in returning to work and does not need vocational rehab.           Education: Education Goals: Education classes will be provided on a weekly basis, covering required topics. Participant will state understanding/return demonstration of topics presented.  Learning Barriers/Preferences:  Learning Barriers/Preferences - 06/08/20 1405      Learning Barriers/Preferences   Learning Barriers None    Learning Preferences Written Material;Video;Skilled Demonstration;Verbal Instruction;Audio           Education Topics: Hypertension, Hypertension Reduction -Define heart disease and high blood pressure. Discus how high blood pressure affects the body and ways to reduce high blood pressure.   Exercise and Your Heart -Discuss why it is important to exercise, the FITT principles of exercise, normal and abnormal responses to exercise, and how to exercise safely.   Angina -Discuss definition of angina, causes of angina, treatment of angina, and how to decrease risk of having angina.   Cardiac Medications -Review what the following cardiac medications are used for, how they affect the body, and side effects that may occur when taking the medications.  Medications include Aspirin, Beta blockers, calcium channel blockers, ACE Inhibitors, angiotensin receptor blockers, diuretics, digoxin, and antihyperlipidemics.   Congestive Heart Failure -Discuss the definition of CHF, how to live with CHF, the signs and symptoms of CHF, and how keep track of weight and sodium intake.   Heart Disease and Intimacy -Discus the effect  sexual activity has on the heart, how changes occur during intimacy as we age, and safety during sexual activity.   Smoking Cessation / COPD -Discuss different methods to quit smoking, the health benefits of quitting smoking, and the definition of COPD.   Nutrition I: Fats -Discuss the types of cholesterol, what cholesterol does to the heart, and how cholesterol levels can be controlled.   Nutrition II: Labels -Discuss the different components of food labels and how to read food label   Heart Parts/Heart Disease and PAD -Discuss the anatomy of the heart, the pathway of blood circulation through the heart, and these are affected by heart disease.   Stress I: Signs and Symptoms -Discuss the causes of stress, how stress may lead to anxiety and depression, and ways to limit stress.   Stress II: Relaxation -Discuss different types of relaxation techniques to limit stress.   Warning Signs of  Stroke / TIA -Discuss definition of a stroke, what the signs and symptoms are of a stroke, and how to identify when someone is having stroke.   Knowledge Questionnaire Score:  Knowledge Questionnaire Score - 06/08/20 1406      Knowledge Questionnaire Score   Pre Score 18/24           Core Components/Risk Factors/Patient Goals at Admission:  Personal Goals and Risk Factors at Admission - 06/08/20 1406      Core Components/Risk Factors/Patient Goals on Admission    Weight Management Obesity;Yes    Intervention Weight Management: Develop a combined nutrition and exercise program designed to reach desired caloric intake, while maintaining appropriate intake of nutrient and fiber, sodium and fats, and appropriate energy expenditure required for the weight goal.;Weight Management: Provide education and appropriate resources to help participant work on and attain dietary goals.;Weight Management/Obesity: Establish reasonable short term and long term weight goals.;Obesity: Provide education and  appropriate resources to help participant work on and attain dietary goals.    Admit Weight 303 lb (137.4 kg)    Goal Weight: Short Term 298 lb (135.2 kg)    Goal Weight: Long Term 293 lb (132.9 kg)    Personal Goal Other Yes    Personal Goal Get stronger; get back to doing his ADL's. Lose more weight: 10 lbs in the program and more long term.    Intervention Patient will attend CR 3 days/week and supplement with exercise at home 3 days/week.    Expected Outcomes Patient will meet both personal and program goals.           Core Components/Risk Factors/Patient Goals Review:   Goals and Risk Factor Review    Row Name 07/01/20 0802             Core Components/Risk Factors/Patient Goals Review   Personal Goals Review Weight Management/Obesity       Review Patient is new the the program completing 3 sessions. He has multiple risk factors for CAD and is doing the program for risk modification. His personal goals are to get stronger; be able to do his ADL's and lose 10 lbs in the program and more long-term. We will cotinue to monitor as he works toward meeting both personal and program goals.       Expected Outcomes Patient will complete the program meeting both personal and program goals.              Core Components/Risk Factors/Patient Goals at Discharge (Final Review):   Goals and Risk Factor Review - 07/01/20 0802      Core Components/Risk Factors/Patient Goals Review   Personal Goals Review Weight Management/Obesity    Review Patient is new the the program completing 3 sessions. He has multiple risk factors for CAD and is doing the program for risk modification. His personal goals are to get stronger; be able to do his ADL's and lose 10 lbs in the program and more long-term. We will cotinue to monitor as he works toward meeting both personal and program goals.    Expected Outcomes Patient will complete the program meeting both personal and program goals.           ITP  Comments:   Comments: ITP REVIEW Pt is making expected progress toward Cardiac Rehab goals after completing 4 sessions. Recommend continued exercise, life style modification, education, and increased stamina and strength.

## 2020-07-08 ENCOUNTER — Encounter (HOSPITAL_COMMUNITY): Payer: Medicare Other

## 2020-07-11 ENCOUNTER — Encounter (HOSPITAL_COMMUNITY)
Admission: RE | Admit: 2020-07-11 | Discharge: 2020-07-11 | Disposition: A | Discharge status: Home / Self Care | Payer: Medicare Other | Source: Ambulatory Visit | Attending: Cardiology | Admitting: Cardiology

## 2020-07-11 ENCOUNTER — Other Ambulatory Visit: Payer: Self-pay

## 2020-07-11 VITALS — Wt 311.5 lb

## 2020-07-11 DIAGNOSIS — Z955 Presence of coronary angioplasty implant and graft: Secondary | ICD-10-CM

## 2020-07-11 DIAGNOSIS — I2121 ST elevation (STEMI) myocardial infarction involving left circumflex coronary artery: Secondary | ICD-10-CM

## 2020-07-11 NOTE — Progress Notes (Signed)
Daily Session Note  Patient Details  Name: Ryan Garner MRN: [id number] Date of Birth: 03/26/1946 Referring Provider:   Flowsheet Row CARDIAC REHAB PHASE II ORIENTATION from 06/08/2020 in [place]  Referring Provider Dr. Harrington Challenger      Encounter Date: 07/11/2020  Check In:  Session Check In - 07/11/20 1445      Check-In   Supervising physician immediately available to respond to emergencies [facility] MD immediately available    Physician(s) Dr. Melene Muller    Location [facility]    Staff Present Cathren Harsh, MS, Exercise Physiologist;Dalton Kris Mouton, MS, ACSM-CEP, Exercise Physiologist    Virtual Visit No    Medication changes reported     No    Fall or balance concerns reported    No    Tobacco Cessation No Change    Warm-up and Cool-down Performed as group-led instruction    Resistance Training Performed Yes    VAD Patient? No    PAD/SET Patient? No      Pain Assessment   Currently in Pain? No/denies    Multiple Pain Sites No           Capillary Blood Glucose: No results found for this or any previous visit (from the past 24 hour(s)).    Social History   Tobacco Use  Smoking Status Former Smoker  . Types: Cigarettes  . Quit date: 04/02/1959  . Years since quitting: 61.3  Smokeless Tobacco Never Used    Goals Met:  Independence with exercise equipment Exercise tolerated well No report of cardiac concerns or symptoms Strength training completed today  Goals Unmet:  Not Applicable  Comments: check out 1545   Dr. Kathie Dike is Medical Director for [facility].

## 2020-07-13 ENCOUNTER — Encounter (HOSPITAL_COMMUNITY): Payer: Medicare Other

## 2020-07-15 ENCOUNTER — Encounter (HOSPITAL_COMMUNITY): Payer: Medicare Other

## 2020-07-18 ENCOUNTER — Encounter: Payer: Self-pay | Admitting: Family Medicine

## 2020-07-18 ENCOUNTER — Encounter (HOSPITAL_COMMUNITY)
Admission: RE | Admit: 2020-07-18 | Discharge: 2020-07-18 | Disposition: A | Discharge status: Home / Self Care | Payer: Medicare Other | Source: Ambulatory Visit | Attending: Cardiology | Admitting: Cardiology

## 2020-07-18 ENCOUNTER — Ambulatory Visit (INDEPENDENT_AMBULATORY_CARE_PROVIDER_SITE_OTHER): Payer: Medicare Other | Admitting: Family Medicine

## 2020-07-18 ENCOUNTER — Other Ambulatory Visit: Payer: Self-pay

## 2020-07-18 DIAGNOSIS — J0111 Acute recurrent frontal sinusitis: Secondary | ICD-10-CM | POA: Diagnosis not present

## 2020-07-18 DIAGNOSIS — Z955 Presence of coronary angioplasty implant and graft: Secondary | ICD-10-CM

## 2020-07-18 DIAGNOSIS — I2121 ST elevation (STEMI) myocardial infarction involving left circumflex coronary artery: Secondary | ICD-10-CM

## 2020-07-18 MED ORDER — AMOXICILLIN-POT CLAVULANATE 875-125 MG PO TABS: 1.0000 | ORAL_TABLET | Freq: Two times a day (BID) | ORAL | 0 refills | Status: DC

## 2020-07-18 MED ORDER — PREDNISONE 10 MG PO TABS: ORAL_TABLET | ORAL | 0 refills | Status: DC

## 2020-07-18 NOTE — Progress Notes (Signed)
Subjective:    Patient ID: Ryan Garner, male    DOB: 07-21-1946, 74 y.o.   MRN: [id number]   HPI: Ryan Garner is a 74 y.o. male presenting for sinus congestion. Pressure around eyes and forehead. PND with cough. Sx getting worse. Feeling miserable. Onset 2 months ago. Temporary relief with Augmentin. Worse in the mornings. Vomited up phlegm one morning last week.    Depression screen [facility] 2/9 06/30/2020 06/08/2020 05/10/2020 04/14/2020 03/17/2020  Decreased Interest 0 0 0 0 0  Down, Depressed, Hopeless 0 0 0 1 0  PHQ - 2 Score 0 0 0 1 0  Altered sleeping - 0 - - -  Tired, decreased energy - 3 - - -  Change in appetite - 0 - - -  Feeling bad or failure about yourself  - 1 - - -  Trouble concentrating - 0 - - -  Moving slowly or fidgety/restless - 0 - - -  Suicidal thoughts - 0 - - -  PHQ-9 Score - 4 - - -  Difficult doing work/chores - Somewhat difficult - - -     Relevant past medical, surgical, family and social history reviewed and updated as indicated.  Interim medical history since our last visit reviewed. Allergies and medications reviewed and updated.  ROS:  Review of Systems   Social History   Tobacco Use  Smoking Status Former Smoker  . Types: Cigarettes  . Quit date: 04/02/1959  . Years since quitting: 61.3  Smokeless Tobacco Never Used       Objective:     Wt Readings from Last 3 Encounters:  07/05/20 (!) 303 lb 9.2 oz (137.7 kg)  06/08/20 (!) 300 lb 14.9 oz (136.5 kg)  05/18/20 (!) 302 lb (137 kg)     Exam deferred. Pt. Harboring due to COVID 19. Phone visit performed.   Assessment & Plan:   1. Acute recurrent frontal sinusitis     Meds ordered this encounter  Medications  . predniSONE (DELTASONE) 10 MG tablet    Sig: Take 5 daily for 2 days followed by 4,3,2 and 1 for 2 days each.    Dispense:  30 tablet    Refill:  0  . amoxicillin-clavulanate (AUGMENTIN) 875-125 MG tablet    Sig: Take 1 tablet by mouth 2 (two) times daily. Take all  of this medication    Dispense:  28 tablet    Refill:  0    Due to A fib will avoid decongestants. Pt. Question if there is a cure. I suggested eval for possible ablation. He will call his cardiologist for an appt.    Diagnoses and all orders for this visit:  Acute recurrent frontal sinusitis  Other orders -     predniSONE (DELTASONE) 10 MG tablet; Take 5 daily for 2 days followed by 4,3,2 and 1 for 2 days each. -     amoxicillin-clavulanate (AUGMENTIN) 875-125 MG tablet; Take 1 tablet by mouth 2 (two) times daily. Take all of this medication    Virtual Visit via telephone Note  I discussed the limitations, risks, security and privacy concerns of performing an evaluation and management service by telephone and the availability of in person appointments. The patient was identified with two identifiers. Pt.expressed understanding and agreed to proceed. Pt. Is at home. Dr. Darlyn Read is in his office.  Follow Up Instructions:   I discussed the assessment and treatment plan with the patient. The patient was provided an opportunity to ask questions  and all were answered. The patient agreed with the plan and demonstrated an understanding of the instructions.   The patient was advised to call back or seek an in-person evaluation if the symptoms worsen or if the condition fails to improve as anticipated.   Total minutes including chart review and phone contact time: 13   Follow up plan: Return if symptoms worsen or fail to improve.  Claretta Fraise, MD [place]

## 2020-07-18 NOTE — Progress Notes (Signed)
Daily Session Note  Patient Details  Name: ASAPH SERENA MRN: [id number] Date of Birth: [birth year] Referring Provider:   Flowsheet Row CARDIAC REHAB PHASE II ORIENTATION from 06/08/2020 in [place]  Referring Provider Dr. Harrington Challenger      Encounter Date: 07/18/2020  Check In:  Session Check In - 07/18/20 1444      Check-In   Supervising physician immediately available to respond to emergencies [facility] MD immediately available    Physician(s) Harrington Challenger    Location [facility]    Staff Present Geanie Cooley, RN;Dalton Kris Mouton, MS, ACSM-CEP, Exercise Physiologist    Virtual Visit No    Medication changes reported     No    Fall or balance concerns reported    No    Tobacco Cessation No Change    Warm-up and Cool-down Performed as group-led instruction    Resistance Training Performed Yes    VAD Patient? No    PAD/SET Patient? No      Pain Assessment   Currently in Pain? No/denies    Pain Score 0-No pain    Multiple Pain Sites No           Capillary Blood Glucose: No results found for this or any previous visit (from the past 24 hour(s)).    Social History   Tobacco Use  Smoking Status Former Smoker  . Types: Cigarettes  . Quit date: 04/02/1959  . Years since quitting: 61.3  Smokeless Tobacco Never Used    Goals Met:  Independence with exercise equipment Exercise tolerated well No report of cardiac concerns or symptoms Strength training completed today  Goals Unmet:  Not Applicable  Comments: check out @ 15:45   Dr. Kathie Dike is Medical Director for [facility].

## 2020-07-19 ENCOUNTER — Telehealth: Payer: Self-pay | Admitting: Internal Medicine

## 2020-07-19 NOTE — Telephone Encounter (Signed)
Patient will see Dr. Tenny Craw tomorrow at 2:20 pm

## 2020-07-19 NOTE — Telephone Encounter (Signed)
Patient c/o Palpitations:  High priority if patient c/o lightheadedness, shortness of breath, or chest pain  1) How long have you had palpitations/irregular HR/ Afib? Are you having the symptoms now? Patient states he is currently and has been in afib since August, 2021.  2) Are you currently experiencing lightheadedness, SOB or CP? No  3) Do you have a history of afib (atrial fibrillation) or irregular heart rhythm? Yes   4) Have you checked your BP or HR? (document readings if available): no   5) Are you experiencing any other symptoms? SOB   Pt c/o Shortness Of Breath: STAT if SOB developed within the last 24 hours or pt is noticeably SOB on the phone  1. Are you currently SOB (can you hear that pt is SOB on the phone)? No  2. How long have you been experiencing SOB? Since August, 2021  3. Are you SOB when sitting or when up moving around? When up and moving around  4. Are you currently experiencing any other symptoms? No   ?

## 2020-07-19 NOTE — Telephone Encounter (Signed)
Since September having no energy and SOB. Patient stated his SOB has gotten worse and he has BLE pitting edema. Patient stated he has sinus drainage and congestion, patient had virtual visit with PCP who started patient on augmentin and prednisone. Patient stated his PCP suggested he see his cardiologist. Suggested urgent care or ED due to patient having worsening SOB. Patient stated he cannot leave his wife alone for long periods of time, that he takes care of her since she had 4 strokes. Informed patient that he probably needs to get evaluated in person. Patient stated there is an urgent care he could go to in town, if he had to go. Patient would like Dr. Tenny Craw' advisement. Will forward to Dr. Tenny Craw.

## 2020-07-19 NOTE — Telephone Encounter (Signed)
Left message for patient to call back. Will try to schedule patient with Dr. Tenny Craw tomorrow at 2:00 pm in [place] when he calls back.

## 2020-07-19 NOTE — Telephone Encounter (Signed)
I am in [place] today or tomorrow  Can see   Otherwise urgent care

## 2020-07-20 ENCOUNTER — Ambulatory Visit (INDEPENDENT_AMBULATORY_CARE_PROVIDER_SITE_OTHER): Payer: Medicare Other | Admitting: Internal Medicine

## 2020-07-20 ENCOUNTER — Other Ambulatory Visit: Payer: Self-pay

## 2020-07-20 ENCOUNTER — Encounter (HOSPITAL_COMMUNITY): Payer: Medicare Other

## 2020-07-20 ENCOUNTER — Encounter: Payer: Self-pay | Admitting: Internal Medicine

## 2020-07-20 ENCOUNTER — Other Ambulatory Visit (HOSPITAL_COMMUNITY)
Admission: RE | Admit: 2020-07-20 | Discharge: 2020-07-20 | Disposition: A | Discharge status: Home / Self Care | Payer: Medicare Other | Source: Ambulatory Visit | Attending: Internal Medicine | Admitting: Internal Medicine

## 2020-07-20 VITALS — BP 114/82 | HR 65 | Ht 68.0 in | Wt 315.0 lb

## 2020-07-20 DIAGNOSIS — R0602 Shortness of breath: Secondary | ICD-10-CM | POA: Insufficient documentation

## 2020-07-20 DIAGNOSIS — I2510 Atherosclerotic heart disease of native coronary artery without angina pectoris: Secondary | ICD-10-CM

## 2020-07-20 LAB — COMPREHENSIVE METABOLIC PANEL
ALT: 43 U/L (ref 0–44)
AST: 31 U/L (ref 15–41)
Albumin: 3.5 g/dL (ref 3.5–5.0)
Alkaline Phosphatase: 74 U/L (ref 38–126)
Anion gap: 10 (ref 5–15)
BUN: 26 mg/dL — ABNORMAL HIGH (ref 8–23)
CO2: 22 mmol/L (ref 22–32)
Calcium: 8.9 mg/dL (ref 8.9–10.3)
Chloride: 105 mmol/L (ref 98–111)
Creatinine, Ser: 1.12 mg/dL (ref 0.61–1.24)
GFR, Estimated: >60 mL/min (ref >60)
Glucose, Bld: 165 mg/dL — ABNORMAL HIGH (ref 70–99)
Potassium: 4.2 mmol/L (ref 3.5–5.1)
Sodium: 137 mmol/L (ref 135–145)
Total Bilirubin: 0.6 mg/dL (ref 0.3–1.2)
Total Protein: 6.3 g/dL — ABNORMAL LOW (ref 6.5–8.1)

## 2020-07-20 LAB — CBC
HCT: 43.3 % (ref 39.0–52.0)
Hemoglobin: 13.6 g/dL (ref 13.0–17.0)
MCH: 28.6 pg (ref 26.0–34.0)
MCHC: 31.4 g/dL (ref 30.0–36.0)
MCV: 91.2 fL (ref 80.0–100.0)
Platelets: 251 10*3/uL (ref 150–400)
RBC: 4.75 MIL/uL (ref 4.22–5.81)
RDW: 13.6 % (ref 11.5–15.5)
WBC: 9.3 10*3/uL (ref 4.0–10.5)
nRBC: 0 % (ref 0.0–0.2)

## 2020-07-20 LAB — BRAIN NATRIURETIC PEPTIDE: B Natriuretic Peptide: 247 pg/mL — ABNORMAL HIGH (ref 0.0–100.0)

## 2020-07-20 NOTE — Patient Instructions (Addendum)
Medication Instructions:  Your physician has recommended you make the following change in your medication:  1. Take Lasix 80 mg when you get home  *If you need a refill on your cardiac medications before your next appointment, please call your pharmacy*   Lab Work: Today: CBC, CMET, BNP If you have labs (blood work) drawn today and your tests are completely normal, you will receive your results only by: Marland Kitchen MyChart Message (if you have MyChart) OR . A paper copy in the mail If you have any lab test that is abnormal or we need to change your treatment, we will call you to review the results.   Testing/Procedures: NONE   Follow-Up: At [facility], you and your health needs are our priority.  As part of our continuing mission to provide you with exceptional heart care, we have created designated Provider Care Teams.  These Care Teams include your primary Cardiologist (physician) and Advanced Practice Providers (APPs -  Physician Assistants and Nurse Practitioners) who all work together to provide you with the care you need, when you need it.  We recommend signing up for the patient portal called "MyChart".  Sign up information is provided on this After Visit Summary.  MyChart is used to connect with patients for Virtual Visits (Telemedicine).  Patients are able to view lab/test results, encounter notes, upcoming appointments, etc.  Non-urgent messages can be sent to your provider as well.   To learn more about what you can do with MyChart, go to [email or web address].     YOU WILL NEED TO BE ADMITTED TO THE [facility]

## 2020-07-20 NOTE — Progress Notes (Signed)
Cardiology Office Note   Date:  07/20/2020   ID:  Ryan Garner, DOB [birth year], MRN [id number]  PCP:  [name], [name], MD  Cardiologist:   Dorris Carnes, MD   Pt presents for follow up of atrial fib and CAD   History of Present Illness: Ryan Garner is a 74 y.o. male with a history of HTN, HL, pre DM, morbid obesity, atrial fibrillation and CAD    I  saw him in consult in Aug 2021.  He was admitted to [facility] on 04/30/20 with CP and STEMI.  Underwent cath with PTCA/DES of LCx    Flecanide stopped  He was seen by Beckie Salts (EP  plan for rate control and anticoagulatoin  Not a candidate for Tikosyn.  Amiodarone alos not felt to be a good choice.    I saw the pt in Oct 2021 after discharge from the [place]  The pt called in yesterday   Said since September he has had no energy.  Notes more SOB and edema   Seen virtually by PCP   Placed on augmentin and prednisone  PCP recomm he see cardiology   The pt says he got very SOB walking in from parking lot   Notes mild chest pressure  Notes wheezing.   Notes increased edema Says he is trying to eat low Na food  Current Meds  Medication Sig  . amoxicillin-clavulanate (AUGMENTIN) 875-125 MG tablet Take 1 tablet by mouth 2 (two) times daily. Take all of this medication  . apixaban (ELIQUIS) 5 MG TABS tablet Take 1 tablet (5 mg total) by mouth 2 (two) times daily.  Marland Kitchen atorvastatin (LIPITOR) 80 MG tablet Take 1 tablet (80 mg total) by mouth daily.  . clopidogrel (PLAVIX) 75 MG tablet Take 1 tablet (75 mg total) by mouth daily with breakfast.  . fluticasone (FLONASE) 50 MCG/ACT nasal spray Place 2 sprays into both nostrils daily. (Patient taking differently: Place 2 sprays into both nostrils daily as needed for allergies.)  . furosemide (LASIX) 40 MG tablet Take 1 tablet (40 mg total) by mouth daily.  . metoprolol tartrate (LOPRESSOR) 25 MG tablet Take 1.5 tablets (37.5 mg total) by mouth 2 (two) times daily.  . Misc Natural Products (PROSTATE  SUPPORT) 300-15 MG TABS Take 1 tablet by mouth daily. Super Beta Prostate  . Multiple Vitamin (MULTIVITAMIN WITH MINERALS) TABS tablet Take 1 tablet by mouth daily.  . nitroGLYCERIN (NITROSTAT) 0.4 MG SL tablet Place 1 tablet (0.4 mg total) under the tongue every 5 (five) minutes x 3 doses as needed for chest pain.  . predniSONE (DELTASONE) 10 MG tablet Take 5 daily for 2 days followed by 4,3,2 and 1 for 2 days each.     Allergies:   Rofecoxib   Past Medical History:  Diagnosis Date  . A-fib ([place])   . Hyperlipidemia   . Hypertension   . Metabolic syndrome   . Prediabetes   . STEMI (ST elevation myocardial infarction) [facility])     Past Surgical History:  Procedure Laterality Date  . APPENDECTOMY    . CHOLECYSTECTOMY N/A 03/02/2020   Procedure: LAPAROSCOPIC CHOLECYSTECTOMY;  Surgeon: Aviva Signs, MD;  Location: [facility];  Service: General;  Laterality: N/A;  . CORONARY STENT INTERVENTION N/A 04/30/2020   Procedure: CORONARY STENT INTERVENTION;  Surgeon: Martinique, Peter M, MD;  Location: [place] CV LAB;  Service: Cardiovascular;  Laterality: N/A;  . CORONARY/GRAFT ACUTE MI REVASCULARIZATION N/A 04/30/2020   Procedure: Coronary/Graft Acute MI Revascularization;  Surgeon: Martinique, Peter M, MD;  Location: [facility];  Service: Cardiovascular;  Laterality: N/A;  . Eyelid Surgery Bilateral   . HERNIA REPAIR    . knee tendons repair    . LEFT HEART CATH AND CORONARY ANGIOGRAPHY N/A 04/30/2020   Procedure: LEFT HEART CATH AND CORONARY ANGIOGRAPHY;  Surgeon: Martinique, Peter M, MD;  Location: [place] CV LAB;  Service: Cardiovascular;  Laterality: N/A;  . ROTATOR CUFF REPAIR Bilateral   . TONSILLECTOMY       Social History:  The patient  reports that he quit smoking about 61 years ago. His smoking use included cigarettes. He has never used smokeless tobacco. He reports that he does not drink alcohol and does not use drugs.   Family History:  The patient's family history includes  Arthritis in his father; Cancer in his mother; Coronary artery disease in his mother; Diabetes in his brother and maternal grandmother; Heart attack in his brother.    ROS:  Please see the history of present illness. All other systems are reviewed and  Negative to the above problem except as noted.    PHYSICAL EXAM: VS:  BP 114/82   Pulse 65   Ht 5\' 8"  (1.727 m)   Wt (!) 315 lb (142.9 kg)   SpO2 96%   BMI 47.90 kg/m   GEN: Morbidly obese 74 yo in no acute distress  HEENT: normal  Neck: JVP is increased  Cardiac: Irreg irreg ; no murmurs   2+ LE edema (wearing support hose) Respiratory:  Rales and wheezes bilaterally   GI: soft, obese  Nontender  MS: no deformity Moving all extremities   Skin: warm and dry, no rash Neuro:  Strength and sensation are intact Psych: euthymic mood, full affect   EKG:  EKG is ordered today.  Atrial fibrillation   82 bpm    Cardiac Cath 10/9    Cath: 04/30/20   Ramus lesion is 75% stenosed.  Mid Cx lesion is 99% stenosed.  Post intervention, there is a 0% residual stenosis.  A drug-eluting stent was successfully placed using a STENT RESOLUTE ONYX 2.5X34.  The left ventricular systolic function is normal.  LV end diastolic pressure is mildly elevated.  The left ventricular ejection fraction is 55-65% by visual estimate.  1. 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 Good LV systolic function 3. Elevated LVEDP 4. Successful PCI of the mid to distal LCx with DES x 1.  Plan: ASA 81 mg daily for one month. Plavix 75 mg daily for one year. May resume Eliquis tomorrow. IV diuresis. Will need to stop Flecainide. Consider alternative AAD therapy.  ECHO   05/01/20  1. Anterior and anterolateral hypokinesis. Left ventricular ejection fraction, by estimation, is 60 to 65%. The left ventricle has normal function. The left ventricle has no regional wall motion abnormalities. Left ventricular diastolic parameters are  indeterminate. 2. Right ventricular systolic function is normal. The right ventricular size is normal. There is severely elevated pulmonary artery systolic pressure. 3. Left atrial size was severely dilated. 4. The mitral valve is normal in structure. Mild to moderate mitral valve regurgitation. No evidence of mitral stenosis. 5. The aortic valve is calcified. There is moderate calcification of the aortic valve. There is moderate thickening of the aortic valve. Aortic valve regurgitation is mild. No aortic stenosis is present. Aortic valve area, by VTI measures 1.04 cm. Aortic valve mean gradient measures 15.2 mmHg. Aortic valve Vmax measures 2.70 m/s. 6.  Aortic dilatation noted. There is mild dilatation of the ascending aorta, measuring 38 mm. 7. The inferior vena cava is dilated in size with >50% respiratory variability, suggesting right atrial pressure of 8 mmHg.  Lipid Panel    Component Value Date/Time   CHOL 122 05/01/2020 1759   CHOL 166 09/04/2018 1156   TRIG 63 05/01/2020 1759   TRIG 74 08/27/2014 0826   HDL 30 (L) 05/01/2020 1759   HDL 42 09/04/2018 1156   HDL 56 08/27/2014 0826   CHOLHDL 4.1 05/01/2020 1759   VLDL 13 05/01/2020 1759   LDLCALC 79 05/01/2020 1759   LDLCALC 108 (H) 09/04/2018 1156   LDLCALC 78 11/13/2013 0911      Wt Readings from Last 3 Encounters:  07/20/20 (!) 315 lb (142.9 kg)  07/18/20 (!) 311 lb 8.2 oz (141.3 kg)  07/05/20 (!) 303 lb 9.2 oz (137.7 kg)      ASSESSMENT AND PLAN: 1   Acute CHF   Pt with evid of volume overload today    I reviewed with him   I would reocmm inpatient admission to hasten volume loss since he is so symptomatic and has signif volume overload   (note at time of cath his LVEDP was increased)  The pt lives with wife and is sole caregiver.  Needs to organize someone to care for her   Should be able to come in tomorrow.   Outpt diuresis would be very difficult and with his degree of overload slow  Would get labs today    Told patient to take 80 mg lasix this PM   Will call tomorrow  2  CAD   Pt with intervention to LCx in October   I am not convinced that above presentation represents problem with stented region.  Once diuresed would get echo to reconfirm LVEF wall motion    2  Atrial fibrillation   Pt is rate control in afib  On Eliquis    3  HL  Keep on atorvastatin    Current medicines are reviewed at length with the patient today.  The patient does not have concerns regarding medicines.  Signed, [name], MD  07/20/2020 2:30 PM    [facility] [address] [place], [place], [place]  [address] Phone: [phone]; Fax: [phone]

## 2020-07-21 ENCOUNTER — Encounter (HOSPITAL_COMMUNITY): Payer: Self-pay | Admitting: Internal Medicine

## 2020-07-21 ENCOUNTER — Inpatient Hospital Stay (HOSPITAL_COMMUNITY)
Admission: AD | Admit: 2020-07-21 | Discharge: 2020-07-25 | DRG: 291 | Disposition: A | Discharge status: Home / Self Care | Payer: Medicare Other | Source: Ambulatory Visit | Attending: Internal Medicine | Admitting: Internal Medicine

## 2020-07-21 DIAGNOSIS — Z7902 Long term (current) use of antithrombotics/antiplatelets: Secondary | ICD-10-CM

## 2020-07-21 DIAGNOSIS — Z955 Presence of coronary angioplasty implant and graft: Secondary | ICD-10-CM

## 2020-07-21 DIAGNOSIS — Z833 Family history of diabetes mellitus: Secondary | ICD-10-CM

## 2020-07-21 DIAGNOSIS — I4891 Unspecified atrial fibrillation: Secondary | ICD-10-CM | POA: Diagnosis not present

## 2020-07-21 DIAGNOSIS — Z87891 Personal history of nicotine dependence: Secondary | ICD-10-CM

## 2020-07-21 DIAGNOSIS — I110 Hypertensive heart disease with heart failure: Principal | ICD-10-CM | POA: Diagnosis present

## 2020-07-21 DIAGNOSIS — G4733 Obstructive sleep apnea (adult) (pediatric): Secondary | ICD-10-CM | POA: Diagnosis present

## 2020-07-21 DIAGNOSIS — I4821 Permanent atrial fibrillation: Secondary | ICD-10-CM | POA: Diagnosis present

## 2020-07-21 DIAGNOSIS — I509 Heart failure, unspecified: Secondary | ICD-10-CM | POA: Diagnosis present

## 2020-07-21 DIAGNOSIS — Z7901 Long term (current) use of anticoagulants: Secondary | ICD-10-CM

## 2020-07-21 DIAGNOSIS — I2510 Atherosclerotic heart disease of native coronary artery without angina pectoris: Secondary | ICD-10-CM | POA: Diagnosis present

## 2020-07-21 DIAGNOSIS — I4819 Other persistent atrial fibrillation: Secondary | ICD-10-CM | POA: Diagnosis present

## 2020-07-21 DIAGNOSIS — Z6841 Body Mass Index (BMI) 40.0 and over, adult: Secondary | ICD-10-CM

## 2020-07-21 DIAGNOSIS — E1159 Type 2 diabetes mellitus with other circulatory complications: Secondary | ICD-10-CM | POA: Diagnosis present

## 2020-07-21 DIAGNOSIS — Z8261 Family history of arthritis: Secondary | ICD-10-CM

## 2020-07-21 DIAGNOSIS — Z79899 Other long term (current) drug therapy: Secondary | ICD-10-CM

## 2020-07-21 DIAGNOSIS — Z888 Allergy status to other drugs, medicaments and biological substances status: Secondary | ICD-10-CM

## 2020-07-21 DIAGNOSIS — I5033 Acute on chronic diastolic (congestive) heart failure: Secondary | ICD-10-CM

## 2020-07-21 DIAGNOSIS — Z8249 Family history of ischemic heart disease and other diseases of the circulatory system: Secondary | ICD-10-CM

## 2020-07-21 DIAGNOSIS — J329 Chronic sinusitis, unspecified: Secondary | ICD-10-CM | POA: Diagnosis present

## 2020-07-21 DIAGNOSIS — E119 Type 2 diabetes mellitus without complications: Secondary | ICD-10-CM | POA: Diagnosis present

## 2020-07-21 DIAGNOSIS — E785 Hyperlipidemia, unspecified: Secondary | ICD-10-CM | POA: Diagnosis present

## 2020-07-21 DIAGNOSIS — R062 Wheezing: Secondary | ICD-10-CM

## 2020-07-21 DIAGNOSIS — I252 Old myocardial infarction: Secondary | ICD-10-CM

## 2020-07-21 DIAGNOSIS — Z20822 Contact with and (suspected) exposure to covid-19: Secondary | ICD-10-CM | POA: Diagnosis present

## 2020-07-21 HISTORY — DX: Acute on chronic diastolic (congestive) heart failure: I50.33

## 2020-07-21 LAB — GLUCOSE, CAPILLARY
Glucose-Capillary: 154 mg/dL — ABNORMAL HIGH (ref 70–99)
Glucose-Capillary: 145 mg/dL — ABNORMAL HIGH (ref 70–99)

## 2020-07-21 LAB — BASIC METABOLIC PANEL
Anion gap: 11 (ref 5–15)
BUN: 28 mg/dL — ABNORMAL HIGH (ref 8–23)
CO2: 24 mmol/L (ref 22–32)
Calcium: 8.8 mg/dL — ABNORMAL LOW (ref 8.9–10.3)
Chloride: 103 mmol/L (ref 98–111)
Creatinine, Ser: 1.05 mg/dL (ref 0.61–1.24)
GFR, Estimated: >60 mL/min (ref >60)
Glucose, Bld: 164 mg/dL — ABNORMAL HIGH (ref 70–99)
Potassium: 4 mmol/L (ref 3.5–5.1)
Sodium: 138 mmol/L (ref 135–145)

## 2020-07-21 LAB — HEMOGLOBIN A1C
Hgb A1c MFr Bld: 6.5 % — ABNORMAL HIGH (ref 4.8–5.6)
Mean Plasma Glucose: 139.85 mg/dL

## 2020-07-21 MED ORDER — FUROSEMIDE 10 MG/ML IJ SOLN
40.0000 mg | Freq: Two times a day (BID) | INTRAMUSCULAR | Status: DC
  Administered 2020-07-22: 40 mg via INTRAVENOUS
  Filled 2020-07-21: qty 4

## 2020-07-21 MED ORDER — FUROSEMIDE 10 MG/ML IJ SOLN: 80.0000 mg | Freq: Three times a day (TID) | INTRAMUSCULAR | Status: DC

## 2020-07-21 MED ORDER — AMOXICILLIN-POT CLAVULANATE 875-125 MG PO TABS
1.0000 | ORAL_TABLET | Freq: Two times a day (BID) | ORAL | Status: DC
  Administered 2020-07-21 – 2020-07-25 (×8): 1 via ORAL
  Filled 2020-07-21 (×8): qty 1

## 2020-07-21 MED ORDER — ONDANSETRON HCL 4 MG PO TABS: 4.0000 mg | ORAL_TABLET | Freq: Four times a day (QID) | ORAL | Status: DC | PRN

## 2020-07-21 MED ORDER — METOPROLOL TARTRATE 25 MG PO TABS
37.5000 mg | ORAL_TABLET | Freq: Two times a day (BID) | ORAL | Status: DC
  Administered 2020-07-21 – 2020-07-25 (×8): 37.5 mg via ORAL
  Filled 2020-07-21 (×8): qty 2

## 2020-07-21 MED ORDER — FUROSEMIDE 10 MG/ML IJ SOLN
40.0000 mg | Freq: Three times a day (TID) | INTRAMUSCULAR | Status: DC
  Administered 2020-07-21: 40 mg via INTRAVENOUS
  Filled 2020-07-21: qty 4

## 2020-07-21 MED ORDER — APIXABAN 5 MG PO TABS
5.0000 mg | ORAL_TABLET | Freq: Two times a day (BID) | ORAL | Status: DC
  Administered 2020-07-21 – 2020-07-25 (×8): 5 mg via ORAL
  Filled 2020-07-21 (×8): qty 1

## 2020-07-21 MED ORDER — CLOPIDOGREL BISULFATE 75 MG PO TABS
75.0000 mg | ORAL_TABLET | Freq: Every day | ORAL | Status: DC
  Administered 2020-07-22 – 2020-07-25 (×4): 75 mg via ORAL
  Filled 2020-07-21 (×4): qty 1

## 2020-07-21 MED ORDER — POTASSIUM CHLORIDE CRYS ER 20 MEQ PO TBCR
40.0000 meq | EXTENDED_RELEASE_TABLET | Freq: Two times a day (BID) | ORAL | Status: DC
  Administered 2020-07-21 – 2020-07-25 (×8): 40 meq via ORAL
  Filled 2020-07-21 (×2): qty 4
  Filled 2020-07-21 (×6): qty 2

## 2020-07-21 MED ORDER — ATORVASTATIN CALCIUM 40 MG PO TABS
80.0000 mg | ORAL_TABLET | Freq: Every day | ORAL | Status: DC
  Administered 2020-07-22 – 2020-07-25 (×4): 80 mg via ORAL
  Filled 2020-07-21 (×4): qty 2

## 2020-07-21 MED ORDER — ACETAMINOPHEN 650 MG RE SUPP: 650.0000 mg | Freq: Four times a day (QID) | RECTAL | Status: DC | PRN

## 2020-07-21 MED ORDER — ENOXAPARIN SODIUM 40 MG/0.4ML ~~LOC~~ SOLN: 40.0000 mg | SUBCUTANEOUS | Status: DC

## 2020-07-21 MED ORDER — ONDANSETRON HCL 4 MG/2ML IJ SOLN: 4.0000 mg | Freq: Four times a day (QID) | INTRAMUSCULAR | Status: DC | PRN

## 2020-07-21 MED ORDER — FLUTICASONE PROPIONATE 50 MCG/ACT NA SUSP: 2.0000 | Freq: Every day | NASAL | Status: DC | PRN

## 2020-07-21 MED ORDER — INSULIN ASPART 100 UNIT/ML ~~LOC~~ SOLN: 0.0000 [IU] | Freq: Every day | SUBCUTANEOUS | Status: DC

## 2020-07-21 MED ORDER — ACETAMINOPHEN 325 MG PO TABS: 650.0000 mg | ORAL_TABLET | Freq: Four times a day (QID) | ORAL | Status: DC | PRN

## 2020-07-21 MED ORDER — FUROSEMIDE 10 MG/ML IJ SOLN
80.0000 mg | Freq: Two times a day (BID) | INTRAMUSCULAR | Status: DC
Start: 2020-07-22 — End: 2020-07-21

## 2020-07-21 MED ORDER — FUROSEMIDE 10 MG/ML IJ SOLN: 40.0000 mg | Freq: Two times a day (BID) | INTRAMUSCULAR | Status: DC

## 2020-07-21 MED ORDER — INSULIN ASPART 100 UNIT/ML ~~LOC~~ SOLN
0.0000 [IU] | Freq: Three times a day (TID) | SUBCUTANEOUS | Status: DC
  Administered 2020-07-21: 3 [IU] via SUBCUTANEOUS

## 2020-07-21 NOTE — Consult Note (Addendum)
Cardiology Consultation:   Patient ID: Ryan Garner MRN: [id number]; DOB: 06/13/1946  Admit date: 07/21/2020 Date of Consult: 07/21/2020  Primary Care Provider: [name], [name], MD [facility] Cardiologist: Dorris Carnes, MD   Patient Profile:   Ryan Garner is a 74 y.o. male with a hx of CAD who is being seen today for the evaluation of CHF at the request of [place]  History of Present Illness:    Ryan Garner is a 74 y.o. male with a history of HTN, HL, pre DM, morbid obesity, atrial fibrillation and CAD    I  saw him in consult in Aug 2021.  He was admitted to [facility] on 04/30/20 with CP and STEMI.  Underwent cath with PTCA/DES of LCx    Flecanide stopped  He was seen by Beckie Salts (EP  plan for rate control and anticoagulatoin  Not a candidate for Tikosyn.  Amiodarone alos not felt to be a good choice.    I saw the pt in Oct 2021 after discharge from the [place]  The pt called in yesterday   Said since September he has had no energy.  Notes more SOB and edema   Seen virtually by PCP   Placed on augmentin and prednisone  PCP recomm he see cardiology   I saw the pt yesterday in clinic   He was very SOB  Wheezing on exam with 2+ edemia   I recomm admission for diuresis   He could not come in yesterday because he had to arrange for his wife to be cared for  He returns today for this    Past Medical History:  Diagnosis Date  . A-fib ([place])   . Acute on chronic diastolic CHF (congestive heart failure) ([place]) 07/21/2020  . Hyperlipidemia   . Hypertension   . Metabolic syndrome   . Prediabetes   . STEMI (ST elevation myocardial infarction) [facility])     Past Surgical History:  Procedure Laterality Date  . APPENDECTOMY    . CHOLECYSTECTOMY N/A 03/02/2020   Procedure: LAPAROSCOPIC CHOLECYSTECTOMY;  Surgeon: Aviva Signs, MD;  Location: [facility];  Service: General;  Laterality: N/A;  . CORONARY STENT INTERVENTION N/A 04/30/2020   Procedure: CORONARY STENT INTERVENTION;  Surgeon:  Martinique, Peter M, MD;  Location: [place] CV LAB;  Service: Cardiovascular;  Laterality: N/A;  . CORONARY/GRAFT ACUTE MI REVASCULARIZATION N/A 04/30/2020   Procedure: Coronary/Graft Acute MI Revascularization;  Surgeon: Martinique, Peter M, MD;  Location: [place] CV LAB;  Service: Cardiovascular;  Laterality: N/A;  . Eyelid Surgery Bilateral   . HERNIA REPAIR    . knee tendons repair    . LEFT HEART CATH AND CORONARY ANGIOGRAPHY N/A 04/30/2020   Procedure: LEFT HEART CATH AND CORONARY ANGIOGRAPHY;  Surgeon: Martinique, Peter M, MD;  Location: [place] CV LAB;  Service: Cardiovascular;  Laterality: N/A;  . ROTATOR CUFF REPAIR Bilateral   . TONSILLECTOMY         Inpatient Medications: Scheduled Meds: . amoxicillin-clavulanate  1 tablet Oral BID  . apixaban  5 mg Oral BID  . [START ON 07/22/2020] atorvastatin  80 mg Oral Daily  . [START ON 07/22/2020] clopidogrel  75 mg Oral Q breakfast  . furosemide  40 mg Intravenous Q8H   Followed by  . [START ON 07/22/2020] furosemide  40 mg Intravenous BID  . insulin aspart  0-15 Units Subcutaneous TID WC  . insulin aspart  0-5 Units Subcutaneous QHS  . metoprolol tartrate  37.5 mg  Oral BID  . potassium chloride  40 mEq Oral BID   Continuous Infusions:  PRN Meds: acetaminophen **OR** acetaminophen, fluticasone, ondansetron **OR** ondansetron (ZOFRAN) IV  Allergies:    Allergies  Allergen Reactions  . Rofecoxib Other (See Comments)    (Vioxx)    Social History:   Social History   Socioeconomic History  . Marital status: Married    Spouse name: Karena Addison  . Number of children: 2  . Years of education: 20  . Highest education level: Doctorate  Occupational History  . 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 Smoking status: Former Smoker    Types: Cigarettes    Quit date: 04/02/1959    Years since quitting: 61.3  . Smokeless tobacco: Never Used   Vaping Use  . Vaping Use: Never used  Substance and Sexual Activity  . Alcohol use: No  . Drug use: No  . Sexual activity: Yes  Other Topics Concern  . Not on file  Social History Narrative  . Not on file   Social Determinants of Health   Financial Resource Strain: Low Risk   . Difficulty of Paying Living Expenses: Not hard at all  Food Insecurity: No Food Insecurity  . Worried About Programme researcher, broadcasting/film/video in the Last Year: Never true  . Ran Out of Food in the Last Year: Never true  Transportation Needs: No Transportation Needs  . Lack of Transportation (Medical): No  . Lack of Transportation (Non-Medical): No  Physical Activity: Sufficiently Active  . Days of Exercise per Week: 3 days  . Minutes of Exercise per Session: 60 min  Stress: Stress Concern Present  . Feeling of Stress : To some extent  Social Connections: Moderately Integrated  . Frequency of Communication with Friends and Family: Once a week  . Frequency of Social Gatherings with Friends and Family: Once a week  . Attends Religious Services: More than 4 times per year  . Active Member of Clubs or Organizations: Yes  . Attends Banker Meetings: 1 to 4 times per year  . Marital Status: Married  Catering manager Violence: Not on file    Family History:    Family History  Problem Relation Age of Onset  . Cancer Mother        lungs  . Coronary artery disease Mother   . Diabetes Brother   . Heart attack Brother   . Diabetes Maternal Grandmother   . Arthritis Father      ROS:  Please see the history of present illness.   All other ROS reviewed and negative.     Physical Exam/Data:   Vitals:   07/21/20 1349 07/21/20 1352  BP: 132/84   Pulse: 92   Resp: 18   Temp: 98.2 F (36.8 C)   TempSrc: Oral   SpO2: 95% 95%  Weight: (!) 141 kg   Height: 5\' 8"  (1.727 m)    No intake or output data in the 24 hours ending 07/21/20 1605 Last 3 Weights 07/21/2020 07/20/2020 07/18/2020  Weight (lbs)  310 lb 13.6 oz 315 lb 311 lb 8.2 oz  Weight (kg) 141 kg 142.883 kg 141.3 kg     Body mass index is 47.26 kg/m.  General:  Morbidly obese 74 yo in NAD  HEENT: normal  Neck: JVP is increased  Endocrine:  No thryomegaly Vascular: No carotid bruits; FA pulses 2+ bilaterally  without bruits  Cardiac:  Irreg irerg   S1, S2  No S3  Gr II/VI systolic murmur lSB   Lungs:  Rales bilaterally  Abd: soft, distended   Nontender  Ext: 2-3+edema Musculoskeletal:  No deformities, BUE and BLE strength normal and equal Skin: warm and dry  Neuro:  CNs 2-12 intact, no focal abnormalities noted Psych:  Normal affect   EKG:  The EKG was personally reviewed and demonstrates:  Afib  Nonspecific IVCD  Q wave in AVL   Telemetry:  Telemetry was personally reviewed and demonstrates:  Afib 80s  Relevant CV Studies: Cath: 04/30/20   Ramus lesion is 75% stenosed.  Mid Cx lesion is 99% stenosed.  Post intervention, there is a 0% residual stenosis.  A drug-eluting stent was successfully placed using a STENT RESOLUTE ONYX 2.5X34.  The left ventricular systolic function is normal.  LV end diastolic pressure is mildly elevated.  The left ventricular ejection fraction is 55-65% by visual estimate.  1. 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 Good LV systolic function 3. Elevated LVEDP 4. Successful PCI of the mid to distal LCx with DES x 1.  Plan: ASA 81 mg daily for one month. Plavix 75 mg daily for one year. May resume Eliquis tomorrow. IV diuresis. Will need to stop Flecainide. Consider alternative AAD therapy.  ECHO   05/01/20  1. Anterior and anterolateral hypokinesis. Left ventricular ejection fraction, by estimation, is 60 to 65%. The left ventricle has normal function. The left ventricle has no regional wall motion abnormalities. Left ventricular diastolic parameters are indeterminate. 2. Right ventricular systolic function is normal. The right ventricular size is  normal. There is severely elevated pulmonary artery systolic pressure. 3. Left atrial size was severely dilated. 4. The mitral valve is normal in structure. Mild to moderate mitral valve regurgitation. No evidence of mitral stenosis. 5. The aortic valve is calcified. There is moderate calcification of the aortic valve. There is moderate thickening of the aortic valve. Aortic valve regurgitation is mild. No aortic stenosis is present. Aortic valve area, by VTI measures 1.04 cm. Aortic valve mean gradient measures 15.2 mmHg. Aortic valve Vmax measures 2.70 m/s. 6. Aortic dilatation noted. There is mild dilatation of the ascending aorta, measuring 38 mm. 7. The inferior vena cava is dilated in size with >50% respiratory variability, suggesting right atrial pressure of 8 mmHg.  Laboratory Data:  High Sensitivity Troponin:  No results for input(s): TROPONINIHS in the last 720 hours.   Chemistry Recent Labs  Lab 07/20/20 1528  NA 137  K 4.2  CL 105  CO2 22  GLUCOSE 165*  BUN 26*  CREATININE 1.12  CALCIUM 8.9  GFRNONAA >60  ANIONGAP 10    Recent Labs  Lab 07/20/20 1528  PROT 6.3*  ALBUMIN 3.5  AST 31  ALT 43  ALKPHOS 74  BILITOT 0.6   Hematology Recent Labs  Lab 07/20/20 1528  WBC 9.3  RBC 4.75  HGB 13.6  HCT 43.3  MCV 91.2  MCH 28.6  MCHC 31.4  RDW 13.6  PLT 251   BNP Recent Labs  Lab 07/20/20 1528  BNP 247.0*    DDimer No results for input(s): DDIMER in the last 168 hours.   Radiology/Studies:  No results found.   Assessment and Plan:   Acute on chronic diastolic CHF   Pt presented yesterday to clinic markedly volume overloaded   BNP probably low due to size/BNPase   Etiology of exacerbation ? Ischemia/injury  I am not convinced of this  Pt had stent this past fall but not CP   I would recomm limited echo once fully diuresed to reconfirm LVEF.    ? afib  Pt has had chronic atrial fibrillation  He was on flecanide when came in with NSTEMI  This was  stopped.  Will need to reassess once diuresed  Review with EP May be as outpt ? Dietary indiscretion  ? If pt has sleep apnea  Need to review   I would increase lasix to 80 IV bid   Follow K and renal function   Strict I/O   DIetary to see patient  Review 2G Na  1500 CC  2.  CAD Pt with NSTEMI in October   Treated with PTCA/DES to LCx    Currently on Plavix  Continue  3.  Atrial fibrillation  Flecanide stopped when had NSTEMI.  Not a candidate for Tikosyn.   Not a good candidate for amiodarone    ? Ablation atttempt  Would need to lose wait.   WIll review with EP after fully diuresed   Continue Eliquis and metoprolol   Continue telemetry.    4  HL   Continue statin      For questions or updates, please contact [place] Please consult [email or web address] for contact info under    Signed, Dorris Carnes, MD  07/21/2020 4:05 PM

## 2020-07-21 NOTE — H&P (Signed)
History and Physical  Patient Name: Ryan Garner     YSA:[id number]    DOB: 11/16/1945    DOA: 07/21/2020 PCP: [name], [name], MD  Patient coming from: Home  Chief Complaint: Swelling, dyspnea on exertion      HPI: Ryan Garner is a 74 y.o. M with hx CAD s/p DES 2 months ago for STEMI, Afib on eliquis, morbid obesity, OSA and diet controlled DM who presents with swelling progressive over weeks.  Patient for started noticing increasing swelling "many weeks ago".  This gradually progressed until it was difficult to walk due to leg swelling, he had orthopnea, and started to have progressive severe dyspnea with exertion.  Finally went to see his cardiologist, who noted slightly increased BNP, normal renal function and electrolytes and blood counts, ECG with rate controlled A. fib, no ST changes, and severe fluid overload.  His cardiologist doubled his Lasix and recommended direct admission to the hospital for IV Lasix given the severity of the swelling.        ROS: Review of Systems  Constitutional: Negative for chills, fever and malaise/fatigue.  HENT: Positive for congestion and sinus pain. Negative for sore throat.   Respiratory: Positive for sputum production, shortness of breath and wheezing.   Cardiovascular: Positive for orthopnea and leg swelling. Negative for chest pain, palpitations and PND.  All other systems reviewed and are negative.         Past Medical History:  Diagnosis Date  . A-fib ([facility])   . Acute on chronic diastolic CHF (congestive heart failure) ([facility]) 07/21/2020  . Hyperlipidemia   . Hypertension   . Metabolic syndrome   . Prediabetes   . STEMI (ST elevation myocardial infarction) [facility])     Past Surgical History:  Procedure Laterality Date  . APPENDECTOMY    . CHOLECYSTECTOMY N/A 03/02/2020   Procedure: LAPAROSCOPIC CHOLECYSTECTOMY;  Surgeon: Franky Macho, MD;  Location: [facility];  Service: General;  Laterality: N/A;  . CORONARY STENT  INTERVENTION N/A 04/30/2020   Procedure: CORONARY STENT INTERVENTION;  Surgeon: Swaziland, Peter M, MD;  Location: [facility] INVASIVE CV LAB;  Service: Cardiovascular;  Laterality: N/A;  . CORONARY/GRAFT ACUTE MI REVASCULARIZATION N/A 04/30/2020   Procedure: Coronary/Graft Acute MI Revascularization;  Surgeon: Swaziland, Peter M, MD;  Location: [facility] INVASIVE CV LAB;  Service: Cardiovascular;  Laterality: N/A;  . Eyelid Surgery Bilateral   . HERNIA REPAIR    . knee tendons repair    . LEFT HEART CATH AND CORONARY ANGIOGRAPHY N/A 04/30/2020   Procedure: LEFT HEART CATH AND CORONARY ANGIOGRAPHY;  Surgeon: Swaziland, Peter M, MD;  Location: [facility] INVASIVE CV LAB;  Service: Cardiovascular;  Laterality: N/A;  . ROTATOR CUFF REPAIR Bilateral   . TONSILLECTOMY      Social History: Patient lives independently, he is a Education officer, environmental.  The patient walks without assistance.  Never smoker.  No alcohol.  Allergies  Allergen Reactions  . Rofecoxib Other (See Comments)    (Vioxx)    Family history: family history includes Arthritis in his father; Cancer in his mother; Coronary artery disease in his mother; Diabetes in his brother and maternal grandmother; Heart attack in his brother.  Prior to Admission medications   Medication Sig Start Date End Date Taking? Authorizing Provider  amoxicillin-clavulanate (AUGMENTIN) 875-125 MG tablet Take 1 tablet by mouth 2 (two) times daily. Take all of this medication 07/18/20  Yes Stacks, Broadus John, MD  apixaban (ELIQUIS) 5 MG TABS tablet Take 1 tablet (5 mg total) by mouth  2 (two) times daily. 04/04/20  Yes Verta Ellen., NP  atorvastatin (LIPITOR) 80 MG tablet Take 1 tablet (80 mg total) by mouth daily. 05/03/20  Yes Cheryln Manly, NP  clopidogrel (PLAVIX) 75 MG tablet Take 1 tablet (75 mg total) by mouth daily with breakfast. 05/03/20  Yes Cheryln Manly, NP  fluticasone (FLONASE) 50 MCG/ACT nasal spray Place 2 sprays into both nostrils daily. Patient taking differently: Place 2  sprays into both nostrils daily as needed for allergies. 07/19/16  Yes Eustaquio Maize, MD  furosemide (LASIX) 40 MG tablet Take 1 tablet (40 mg total) by mouth daily. Patient taking differently: Take 80 mg by mouth daily. 06/07/20  Yes [name], [name], MD  metoprolol tartrate (LOPRESSOR) 25 MG tablet Take 1.5 tablets (37.5 mg total) by mouth 2 (two) times daily. 04/14/20  Yes [name], [name], MD  Misc Natural Products (PROSTATE SUPPORT) 300-15 MG TABS Take 1 tablet by mouth daily. Super Beta Prostate   Yes [provider]  Multiple Vitamin (MULTIVITAMIN WITH MINERALS) TABS tablet Take 1 tablet by mouth daily.   Yes [provider]  nitroGLYCERIN (NITROSTAT) 0.4 MG SL tablet Place 1 tablet (0.4 mg total) under the tongue every 5 (five) minutes x 3 doses as needed for chest pain. 05/02/20  Yes Cheryln Manly, NP       Physical Exam: BP 132/84 (BP Location: Left Arm)   Pulse 92   Temp 98.2 F (36.8 C) (Oral)   Resp 18   Ht 5\' 8"  (1.727 m)   Wt (!) 141 kg   SpO2 95%   BMI 47.26 kg/m  General appearance: Well-developed, obese adult male, alert and in no acute distress needed to be fed.   Eyes: Anicteric, conjunctiva pink, lids and lashes normal. PERRL.    ENT: No nasal deformity, discharge, epistaxis.  Hearing normal. OP moist without lesions.  Lips normal, dentition in good repair. Neck: No neck masses.  Trachea midline.  No thyromegaly/tenderness. Lymph: No cervical or supraclavicular lymphadenopathy. Skin: Warm and dry.  No jaundice.  No suspicious rashes or lesions. Cardiac: Irregular, nl S1-S2, no murmurs appreciated.  Capillary refill is brisk.  JVP elevated.  4+ LE edema to above the kne.  Radial pulses 2+ and symmetric. Respiratory: Normal respiratory rate and rhythm.  Bilaterally without rales. Abdomen: Abdomen soft.  No TTP or guarding. Ascites, distension, hepatosplenomegaly obscured by body habitus.   MSK: No deformities or effusions of the large  joints of the upper or lower extremities bilaterally.  No cyanosis or clubbing. Neuro: Cranial nerves normal. Speech is fluent.  Muscle strength 5/5 and symmetric.    Psych: Sensorium intact and responding to questions, attention normal.  Behavior appropriate.  Affect within normal limits.  Judgment and insight appear normal.     Labs on Admission:  I have personally reviewed following labs and imaging studies: CBC: Recent Labs  Lab 07/20/20 1528  WBC 9.3  HGB 13.6  HCT 43.3  MCV 91.2  PLT [id number]   Basic Metabolic Panel: Recent Labs  Lab 07/20/20 1528 07/21/20 1628  NA 137 138  K 4.2 4.0  CL 105 103  CO2 22 24  GLUCOSE 165* 164*  BUN 26* 28*  CREATININE 1.12 1.05  CALCIUM 8.9 8.8*   GFR: Estimated Creatinine Clearance: 82.5 mL/min (by C-G formula based on SCr of 1.05 mg/dL).  Liver Function Tests: Recent Labs  Lab 07/20/20 1528  AST 31  ALT 43  ALKPHOS 74  BILITOT 0.6  PROT 6.3*  ALBUMIN 3.5   CBG: Recent Labs  Lab 07/21/20 1603  GLUCAP 154*    No results found for this or any previous visit (from the past 240 hour(s)).         Radiological Exams on Admission: Chest x-ray ordered  EKG: Independently reviewed.  She from yesterday obtained and reviewed, shows A. fib, no ST changes, right axis.    Echocardiogram report from last October shows normal EF, increased PASP.       Assessment/Plan   Acute on chronic presumed diastolic CHF Patient had recent echocardiogram normal EF.  Not clearly fluid overloaded, with leg swelling, dyspnea on exertion, orthopnea. -Obtain 2 view chest x-ray -Start Lasix 40 mg IV twice daily -Potassium supplement -Strict intake and output -Daily BMP -Continue metoprolol    Sinusitis Patient has had 2 months of sinus pressure, cough productive of yellow sputum, and congestion of the nose.  Recently started on Augmentin -Continue Augmentin  Permanent atrial fibrillation Rate controlled -Continue home apixaban and  metoprolol  Coronary disease, secondary prevention -Continue home atorvastatin Plavix and metoprolol  Diet-controlled diabetes He reports this is diet controlled, not a real diagnosis -Check A1c -If A1c elevated, or any morning hyperglycemia, will start Accu-Cheks and sliding scale corrections  Morbid obesity BMI 47  Sleep apnea -Home CPAP   DVT prophylaxis: N/a on apixaban  Code Status: FULL  Family Communication:   Disposition Plan: Anticipate IV diuresis, will monitor output, likely to home at discharge. Consults called: Cardiology, appreciate cares Admission status: OBS   At the point of initial evaluation, it is my clinical opinion that admission for OBSERVATION is reasonable and necessary because the patient's presenting complaints in the context of their chronic conditions represent sufficient risk of deterioration or significant morbidity to constitute reasonable grounds for close observation in the hospital setting, but that the patient may be medically stable for discharge from the hospital within 24 to 48 hours.    Medical decision making: Patient seen at 2:42 PM on 07/21/2020.  The patient was discussed with Dr. Harrington Challenger.  What exists of the patient's chart was reviewed in depth and summarized above.  Clinical condition: stable hemodynamically and mentation.        [facility] Please page though [place] or Epic secure chat:  For password, contact charge nurse

## 2020-07-22 ENCOUNTER — Encounter (HOSPITAL_COMMUNITY): Payer: Medicare Other

## 2020-07-22 ENCOUNTER — Observation Stay (HOSPITAL_COMMUNITY): Payer: Medicare Other

## 2020-07-22 DIAGNOSIS — J329 Chronic sinusitis, unspecified: Secondary | ICD-10-CM | POA: Diagnosis present

## 2020-07-22 DIAGNOSIS — I5033 Acute on chronic diastolic (congestive) heart failure: Secondary | ICD-10-CM | POA: Diagnosis not present

## 2020-07-22 DIAGNOSIS — R062 Wheezing: Secondary | ICD-10-CM | POA: Diagnosis not present

## 2020-07-22 DIAGNOSIS — E785 Hyperlipidemia, unspecified: Secondary | ICD-10-CM | POA: Diagnosis present

## 2020-07-22 DIAGNOSIS — I4821 Permanent atrial fibrillation: Secondary | ICD-10-CM | POA: Diagnosis present

## 2020-07-22 DIAGNOSIS — Z87891 Personal history of nicotine dependence: Secondary | ICD-10-CM | POA: Diagnosis not present

## 2020-07-22 DIAGNOSIS — I110 Hypertensive heart disease with heart failure: Secondary | ICD-10-CM | POA: Diagnosis present

## 2020-07-22 DIAGNOSIS — I252 Old myocardial infarction: Secondary | ICD-10-CM | POA: Diagnosis not present

## 2020-07-22 DIAGNOSIS — I482 Chronic atrial fibrillation, unspecified: Secondary | ICD-10-CM | POA: Diagnosis not present

## 2020-07-22 DIAGNOSIS — G4733 Obstructive sleep apnea (adult) (pediatric): Secondary | ICD-10-CM | POA: Diagnosis present

## 2020-07-22 DIAGNOSIS — Z20822 Contact with and (suspected) exposure to covid-19: Secondary | ICD-10-CM | POA: Diagnosis present

## 2020-07-22 DIAGNOSIS — Z6841 Body Mass Index (BMI) 40.0 and over, adult: Secondary | ICD-10-CM | POA: Diagnosis not present

## 2020-07-22 DIAGNOSIS — Z7902 Long term (current) use of antithrombotics/antiplatelets: Secondary | ICD-10-CM | POA: Diagnosis not present

## 2020-07-22 DIAGNOSIS — Z8249 Family history of ischemic heart disease and other diseases of the circulatory system: Secondary | ICD-10-CM | POA: Diagnosis not present

## 2020-07-22 DIAGNOSIS — E119 Type 2 diabetes mellitus without complications: Secondary | ICD-10-CM | POA: Diagnosis present

## 2020-07-22 DIAGNOSIS — Z79899 Other long term (current) drug therapy: Secondary | ICD-10-CM | POA: Diagnosis not present

## 2020-07-22 DIAGNOSIS — Z955 Presence of coronary angioplasty implant and graft: Secondary | ICD-10-CM | POA: Diagnosis not present

## 2020-07-22 DIAGNOSIS — I2510 Atherosclerotic heart disease of native coronary artery without angina pectoris: Secondary | ICD-10-CM | POA: Diagnosis present

## 2020-07-22 DIAGNOSIS — Z7901 Long term (current) use of anticoagulants: Secondary | ICD-10-CM | POA: Diagnosis not present

## 2020-07-22 DIAGNOSIS — Z888 Allergy status to other drugs, medicaments and biological substances status: Secondary | ICD-10-CM | POA: Diagnosis not present

## 2020-07-22 DIAGNOSIS — I4819 Other persistent atrial fibrillation: Secondary | ICD-10-CM | POA: Diagnosis not present

## 2020-07-22 DIAGNOSIS — Z833 Family history of diabetes mellitus: Secondary | ICD-10-CM | POA: Diagnosis not present

## 2020-07-22 DIAGNOSIS — Z8261 Family history of arthritis: Secondary | ICD-10-CM | POA: Diagnosis not present

## 2020-07-22 LAB — SARS CORONAVIRUS 2 (TAT 6-24 HRS): SARS Coronavirus 2: NEGATIVE

## 2020-07-22 LAB — CBC
HCT: 41 % (ref 39.0–52.0)
Hemoglobin: 12.9 g/dL — ABNORMAL LOW (ref 13.0–17.0)
MCH: 28.9 pg (ref 26.0–34.0)
MCHC: 31.5 g/dL (ref 30.0–36.0)
MCV: 91.7 fL (ref 80.0–100.0)
Platelets: 258 10*3/uL (ref 150–400)
RBC: 4.47 MIL/uL (ref 4.22–5.81)
RDW: 13.7 % (ref 11.5–15.5)
WBC: 8.5 10*3/uL (ref 4.0–10.5)
nRBC: 0 % (ref 0.0–0.2)

## 2020-07-22 LAB — BASIC METABOLIC PANEL
Anion gap: 8 (ref 5–15)
BUN: 27 mg/dL — ABNORMAL HIGH (ref 8–23)
CO2: 28 mmol/L (ref 22–32)
Calcium: 9.1 mg/dL (ref 8.9–10.3)
Chloride: 101 mmol/L (ref 98–111)
Creatinine, Ser: 1 mg/dL (ref 0.61–1.24)
GFR, Estimated: >60 mL/min (ref >60)
Glucose, Bld: 113 mg/dL — ABNORMAL HIGH (ref 70–99)
Potassium: 3.7 mmol/L (ref 3.5–5.1)
Sodium: 137 mmol/L (ref 135–145)

## 2020-07-22 LAB — GLUCOSE, CAPILLARY
Glucose-Capillary: 86 mg/dL (ref 70–99)
Glucose-Capillary: 153 mg/dL — ABNORMAL HIGH (ref 70–99)
Glucose-Capillary: 136 mg/dL — ABNORMAL HIGH (ref 70–99)

## 2020-07-22 MED ORDER — FUROSEMIDE 10 MG/ML IJ SOLN
40.0000 mg | Freq: Once | INTRAMUSCULAR | Status: AC
  Administered 2020-07-22: 40 mg via INTRAVENOUS
  Filled 2020-07-22: qty 4

## 2020-07-22 MED ORDER — FUROSEMIDE 10 MG/ML IJ SOLN
80.0000 mg | Freq: Two times a day (BID) | INTRAMUSCULAR | Status: DC
  Administered 2020-07-22 – 2020-07-25 (×6): 80 mg via INTRAVENOUS
  Filled 2020-07-22 (×6): qty 8
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
## 2020-07-22 NOTE — Progress Notes (Signed)
[facility] PROGRESS NOTE    [name]  [id number] DOB: August 18, 1945 DOA: 07/21/2020 PCP: [name], [name], MD      Brief Narrative:  Ryan Garner is a 74 y.o. M with hx CAD s/p DES 2 months ago for STEMI, Afib on eliquis, morbid obesity, OSA and diet controlled DM who presents with swelling progressive over weeks.  Patient for started noticing increasing swelling "many weeks ago".  This gradually progressed until it was difficult to walk due to leg swelling, he had orthopnea, and started to have progressive severe dyspnea with exertion.  Finally went to see his cardiologist, who noted slightly increased BNP, normal renal function and electrolytes and blood counts, ECG with rate controlled A. fib, no ST changes, and severe fluid overload.  His cardiologist doubled his Lasix and recommended direct admission to the hospital for IV Lasix given the severity of the swelling.   Assessment & Plan:  Acute on chronic presumed diastolic CHF Presented with swelling, dyspnea on exertion.  CXR reviewed personally today, shows cardiomegaly, no significant pulmonar markings.  DIuresed 4lbs yesterday, making good UOP this omrning.   -Continue Lasix 40 IV BID -Continue Potassium supplement -Strict intake and output -Daily BMP -Continue metoprolol    Sinusitis -Continue outpatient treatment with Augmentin  Permanent atrial fibrillation Rate controlled -Continue home apixaban and metoprolol  Coronary disease, secondary prevention -Continue home atorvastatin Plavix and metoprolol  Diet-controlled diabetes He reports this is diet controlled, not a real diagnosis A1c actually 6.5%, but appears diet controlled, glucoses here normal -Defer CBGs and SSI   Morbid obesity BMI 47  Sleep apnea -Home CPAP       Disposition: Status is: Inpatient  Remains inpatient appropriate because:IV treatments appropriate due to intensity of illness or inability to  take PO   Dispo: The patient is from: Home              Anticipated d/c is to: Home              Anticipated d/c date is: 3 days              Patient currently is not medically stable to d/c.              MDM: The below labs and imaging reports were reviewed and summarized above.  Medication management as above.    DVT prophylaxis:  apixaban (ELIQUIS) tablet 5 mg  Code Status: FULL Family Communication:           Subjective: Patient still quite swollen, has dyspnea with exertion slightly better, but he still has fairly significant dyspnea with just walking to the bathroom and back.  No chest pain, fever, sputum.  No vomiting.  Objective: Vitals:   07/22/20 0459 07/22/20 0500 07/22/20 0906 07/22/20 1435  BP: 119/68  129/69 (!) 103/51  Pulse: (!) 53  75 72  Resp: 18   18  Temp: 97.6 F (36.4 C)   98.6 F (37 C)  TempSrc:    Oral  SpO2: 98%   96%  Weight:  (!) 139 kg    Height:        Intake/Output Summary (Last 24 hours) at 07/22/2020 1614 Last data filed at 07/22/2020 1256 Gross per 24 hour  Intake 720 ml  Output 2400 ml  Net -1680 ml   Filed Weights   07/21/20 1349 07/22/20 0500  Weight: (!) 141 kg (!) 139 kg    Examination: General appearance: Obese adult male, alert and in  no acute distress.  Sitting on the edge of the bed, reading. HEENT: Anicteric, conjunctiva pink, lids and lashes normal. No nasal deformity, discharge, epistaxis.  Lips moist.   Skin: Warm and dry.  No jaundice.  No suspicious rashes or lesions. Cardiac: RRR, nl S1-S2, no murmurs appreciated.  Capillary refill is brisk.  JVP elevated.  3+ LE edema to the hip.  Radial pulses 2+ and symmetric. Respiratory: Normal respiratory rate and rhythm.  Rales at bilateral bases. Abdomen: Abdomen soft.  No TTP or guarding. No ascites, distension, hepatosplenomegaly.   MSK: No deformities or effusions. Neuro: Awake and alert.  EOMI, moves all extremities. Speech fluent.    Psych:  Sensorium intact and responding to questions, attention normal. Affect normal.  Judgment and insight appear normal.    Data Reviewed: I have personally reviewed following labs and imaging studies:  CBC: Recent Labs  Lab 07/20/20 1528 07/22/20 0616  WBC 9.3 8.5  HGB 13.6 12.9*  HCT 43.3 41.0  MCV 91.2 91.7  PLT 251 258   Basic Metabolic Panel: Recent Labs  Lab 07/20/20 1528 07/21/20 1628 07/22/20 0616  NA 137 138 137  K 4.2 4.0 3.7  CL 105 103 101  CO2 22 24 28   GLUCOSE 165* 164* 113*  BUN 26* 28* 27*  CREATININE 1.12 1.05 1.00  CALCIUM 8.9 8.8* 9.1   GFR: Estimated Creatinine Clearance: 85.9 mL/min (by C-G formula based on SCr of 1 mg/dL). Liver Function Tests: Recent Labs  Lab 07/20/20 1528  AST 31  ALT 43  ALKPHOS 74  BILITOT 0.6  PROT 6.3*  ALBUMIN 3.5   No results for input(s): LIPASE, AMYLASE in the last 168 hours. No results for input(s): AMMONIA in the last 168 hours. Coagulation Profile: No results for input(s): INR, PROTIME in the last 168 hours. Cardiac Enzymes: No results for input(s): CKTOTAL, CKMB, CKMBINDEX, TROPONINI in the last 168 hours. BNP (last 3 results) No results for input(s): PROBNP in the last 8760 hours. HbA1C: Recent Labs    07/21/20 1628  HGBA1C 6.5*   CBG: Recent Labs  Lab 07/21/20 1603 07/21/20 2133 07/22/20 0809 07/22/20 1119  GLUCAP 154* 145* 86 153*   Lipid Profile: No results for input(s): CHOL, HDL, LDLCALC, TRIG, CHOLHDL, LDLDIRECT in the last 72 hours. Thyroid Function Tests: No results for input(s): TSH, T4TOTAL, FREET4, T3FREE, THYROIDAB in the last 72 hours. Anemia Panel: No results for input(s): VITAMINB12, FOLATE, FERRITIN, TIBC, IRON, RETICCTPCT in the last 72 hours. Urine analysis:    Component Value Date/Time   COLORURINE YELLOW 02/28/2020 1440   APPEARANCEUR CLOUDY (A) 02/28/2020 1440   LABSPEC 1.019 02/28/2020 1440   PHURINE 5.0 02/28/2020 1440   GLUCOSEU NEGATIVE 02/28/2020 1440   HGBUR  NEGATIVE 02/28/2020 1440   BILIRUBINUR NEGATIVE 02/28/2020 1440   BILIRUBINUR neg 08/27/2014 0841   KETONESUR NEGATIVE 02/28/2020 1440   PROTEINUR NEGATIVE 02/28/2020 1440   UROBILINOGEN negative 08/27/2014 0841   NITRITE NEGATIVE 02/28/2020 1440   LEUKOCYTESUR NEGATIVE 02/28/2020 1440   Sepsis Labs: @LABRCNTIP (procalcitonin:4,lacticacidven:4)  ) Recent Results (from the past 240 hour(s))  SARS CORONAVIRUS 2 (TAT 6-24 HRS) Nasopharyngeal Nasopharyngeal Swab     Status: None   Collection Time: 07/21/20  3:30 PM   Specimen: Nasopharyngeal Swab  Result Value Ref Range Status   SARS Coronavirus 2 NEGATIVE NEGATIVE Final    Comment: (NOTE) SARS-CoV-2 target nucleic acids are NOT DETECTED.  The SARS-CoV-2 RNA is generally detectable in upper and lower respiratory specimens during the acute phase  of infection. Negative results do not preclude SARS-CoV-2 infection, do not rule out co-infections with other pathogens, and should not be used as the sole basis for treatment or other patient management decisions. Negative results must be combined with clinical observations, patient history, and epidemiological information. The expected result is Negative.  Fact Sheet for Patients: SugarRoll.be  Fact Sheet for Healthcare Providers: https://www.woods-mathews.com/  This test is not yet approved or cleared by the [place] FDA and  has been authorized for detection and/or diagnosis of SARS-CoV-2 by FDA under an Emergency Use Authorization (EUA). This EUA will remain  in effect (meaning this test can be used) for the duration of the COVID-19 declaration under Se ction 564(b)(1) of the Act, 21 U.S.C. section 360bbb-3(b)(1), unless the authorization is terminated or revoked sooner.  Performed at [facility], [place] [address], [place], [place] [address]          Radiology Studies: DG Chest 2 View  Result Date: 07/22/2020 CLINICAL  DATA:  Wheezing EXAM: CHEST - 2 VIEW COMPARISON:  05/18/2020 FINDINGS: Heart is upper limits normal in size. Right basilar density again noted, stable, likely scarring. No confluent airspace opacities otherwise. No effusions or acute bony abnormality. IMPRESSION: Right basilar scarring. No acute cardiopulmonary disease. Electronically Signed   By: Rolm Baptise M.D.   On: 07/22/2020 07:37        Scheduled Meds: . amoxicillin-clavulanate  1 tablet Oral BID  . apixaban  5 mg Oral BID  . atorvastatin  80 mg Oral Daily  . clopidogrel  75 mg Oral Q breakfast  . furosemide  80 mg Intravenous BID  . metoprolol tartrate  37.5 mg Oral BID  . potassium chloride  40 mEq Oral BID   Continuous Infusions:   LOS: 0 days    Time spent: 35 minutes    Edwin Dada, MD [facility] 07/22/2020, 4:14 PM     Please page though [place] or Epic secure chat:  For [employer], Adult nurse

## 2020-07-22 NOTE — Progress Notes (Signed)
Patient uses his own unit from home and has everything he needs at this time. Rt will continue to monitor patient.

## 2020-07-22 NOTE — Progress Notes (Signed)
Patient is using his CPAP unit from home;its has nasal pillows and patient is unsure about the settings he is currently on. He is on room air with no humidification.

## 2020-07-23 DIAGNOSIS — I5033 Acute on chronic diastolic (congestive) heart failure: Secondary | ICD-10-CM | POA: Diagnosis not present

## 2020-07-23 LAB — BASIC METABOLIC PANEL
Anion gap: 10 (ref 5–15)
BUN: 23 mg/dL (ref 8–23)
CO2: 30 mmol/L (ref 22–32)
Calcium: 9.4 mg/dL (ref 8.9–10.3)
Chloride: 99 mmol/L (ref 98–111)
Creatinine, Ser: 1.01 mg/dL (ref 0.61–1.24)
GFR, Estimated: >60 mL/min (ref >60)
Glucose, Bld: 91 mg/dL (ref 70–99)
Potassium: 4.1 mmol/L (ref 3.5–5.1)
Sodium: 139 mmol/L (ref 135–145)

## 2020-07-23 NOTE — Progress Notes (Signed)
[facility] PROGRESS NOTE    Ryan Garner  UVO:[id number] DOB: 1946/05/19 DOA: 07/21/2020 PCP: [name], [name], MD      Brief Narrative:  [place] is a 75 y.o. M with hx CAD s/p DES 2 months ago for STEMI, Afib on eliquis, morbid obesity, OSA and diet controlled DM who presents with swelling progressive over weeks.  Patient for started noticing increasing swelling "many weeks ago".  This gradually progressed until it was difficult to walk due to leg swelling, he had orthopnea, and started to have progressive severe dyspnea with exertion.  Finally went to see his cardiologist, who noted slightly increased BNP, normal renal function and electrolytes and blood counts, ECG with rate controlled A. fib, no ST changes, and severe fluid overload.  His cardiologist doubled his Lasix and recommended direct admission to the hospital for IV Lasix given the severity of the swelling.          Assessment & Plan:  Acute on chronic diastolic CHF Diuresed another 8 pounds, 12 overall.  Net -2.4 L, creatinine potassium good, still substantially swollen and dyspneic with exertion.  -Continue Lasix 40 IV twice daily -Continue potassium -Continue strict intake and output, daily BMP -Continue metoprolol      Sinusitis Started treatment prior to admission -Continue outpatient treatment with Augmentin, day 6  Permanent atrial fibrillation Rate unchanged, within normal limits -Continue apixaban metoprolol  Coronary artery disease, secondary prevention -Continue atorvastatin, Plavix, metoprolol  Diet-controlled diabetes A1c actually 6.5%, but appears diet controlled, glucoses here normal -Defer CBGs and SSI   Morbid obesity BMI 47  Sleep apnea -Home CPAP       Disposition: Status is: Inpatient  Remains inpatient appropriate because:IV treatments appropriate due to intensity of illness or inability to take PO   Dispo: The patient is from:  Home              Anticipated d/c is to: Home              Anticipated d/c date is: 3 days              Patient currently is not medically stable to d/c.       The patient was admitted with fluid overload and severe dyspnea and wheezing in the setting of chronic diastolic CHF.  He is diuresing well, 6L overall, but still has significant fluid overload and symptomatic dyspnea with exertion.      MDM: The below labs and imaging reports were reviewed and summarized above.  Medication management as above.    DVT prophylaxis:  apixaban (ELIQUIS) tablet 5 mg  Code Status: FULL Family Communication:           Subjective: Swelling persists.  Orthopnea and dyspnea on exertion are somewhat better.  He was able to walk to the nurses station, and felt winded, more than his baseline, but improved from previous.  No fever.  He still has some thick sputum but this is starting to clear.  Objective: Vitals:   07/22/20 2246 07/23/20 0456 07/23/20 0500 07/23/20 0859  BP: 125/68  112/80 129/83  Pulse: (!) 57  88 75  Resp: 16  16 18   Temp: 98 F (36.7 C)  (!) 97.4 F (36.3 C) 98.4 F (36.9 C)  TempSrc: Oral  Oral Oral  SpO2: 98%  97% 100%  Weight:  135.3 kg    Height:        Intake/Output Summary (Last 24 hours) at 07/23/2020 1157 Last data  filed at 07/23/2020 1000 Gross per 24 hour  Intake 480 ml  Output 4600 ml  Net -4120 ml   Filed Weights   07/21/20 1349 07/22/20 0500 07/23/20 0456  Weight: (!) 141 kg (!) 139 kg 135.3 kg    Examination: General appearance: Obese adult male, sitting on the edge of the bed, reading, interactive HEENT: Anicteric, conjunctival pink, lids and lashes normal.  No nasal deformity, discharge, or epistaxis, lips moist, dentition in good repair, oropharynx moist, no oral lesions, hearing normal Skin: Skin warm and dry, no suspicious rashes or lesions Cardiac: RRR, no murmurs, 3+ lower extremity edema. Respiratory: Normal respiratory rate and  rhythm at rest, dyspnea with exertion, no rales or wheezing Abdomen: Abdomen soft without tenderness palpation or guarding, no ascites or distention MSK: No deformities or effusions. Neuro: Awake and alert, extraocular movements, moves all extremities with normal strength and coordination, speech fluent.    Psych: Sensorium intact responding questions, attention normal, affect normal, judgment site appear normal.    Data Reviewed: I have personally reviewed following labs and imaging studies:  CBC: Recent Labs  Lab 07/20/20 1528 07/22/20 0616  WBC 9.3 8.5  HGB 13.6 12.9*  HCT 43.3 41.0  MCV 91.2 91.7  PLT 251 0000000   Basic Metabolic Panel: Recent Labs  Lab 07/20/20 1528 07/21/20 1628 07/22/20 0616 07/23/20 0651  NA 137 138 137 139  K 4.2 4.0 3.7 4.1  CL 105 103 101 99  CO2 22 24 28 30   GLUCOSE 165* 164* 113* 91  BUN 26* 28* 27* 23  CREATININE 1.12 1.05 1.00 1.01  CALCIUM 8.9 8.8* 9.1 9.4   GFR: Estimated Creatinine Clearance: 83.8 mL/min (by C-G formula based on SCr of 1.01 mg/dL). Liver Function Tests: Recent Labs  Lab 07/20/20 1528  AST 31  ALT 43  ALKPHOS 74  BILITOT 0.6  PROT 6.3*  ALBUMIN 3.5   No results for input(s): LIPASE, AMYLASE in the last 168 hours. No results for input(s): AMMONIA in the last 168 hours. Coagulation Profile: No results for input(s): INR, PROTIME in the last 168 hours. Cardiac Enzymes: No results for input(s): CKTOTAL, CKMB, CKMBINDEX, TROPONINI in the last 168 hours. BNP (last 3 results) No results for input(s): PROBNP in the last 8760 hours. HbA1C: Recent Labs    07/21/20 1628  HGBA1C 6.5*   CBG: Recent Labs  Lab 07/21/20 1603 07/21/20 2133 07/22/20 0809 07/22/20 1119 07/22/20 1648  GLUCAP 154* 145* 86 153* 136*   Lipid Profile: No results for input(s): CHOL, HDL, LDLCALC, TRIG, CHOLHDL, LDLDIRECT in the last 72 hours. Thyroid Function Tests: No results for input(s): TSH, T4TOTAL, FREET4, T3FREE, THYROIDAB in  the last 72 hours. Anemia Panel: No results for input(s): VITAMINB12, FOLATE, FERRITIN, TIBC, IRON, RETICCTPCT in the last 72 hours. Urine analysis:    Component Value Date/Time   COLORURINE YELLOW 02/28/2020 1440   APPEARANCEUR CLOUDY (A) 02/28/2020 1440   LABSPEC 1.019 02/28/2020 1440   PHURINE 5.0 02/28/2020 1440   GLUCOSEU NEGATIVE 02/28/2020 1440   HGBUR NEGATIVE 02/28/2020 1440   BILIRUBINUR NEGATIVE 02/28/2020 1440   BILIRUBINUR neg 08/27/2014 0841   KETONESUR NEGATIVE 02/28/2020 1440   PROTEINUR NEGATIVE 02/28/2020 1440   UROBILINOGEN negative 08/27/2014 0841   NITRITE NEGATIVE 02/28/2020 1440   LEUKOCYTESUR NEGATIVE 02/28/2020 1440   Sepsis Labs: @LABRCNTIP (procalcitonin:4,lacticacidven:4)  ) Recent Results (from the past 240 hour(s))  SARS CORONAVIRUS 2 (TAT 6-24 HRS) Nasopharyngeal Nasopharyngeal Swab     Status: None   Collection Time: 07/21/20  3:30 PM   Specimen: Nasopharyngeal Swab  Result Value Ref Range Status   SARS Coronavirus 2 NEGATIVE NEGATIVE Final    Comment: (NOTE) SARS-CoV-2 target nucleic acids are NOT DETECTED.  The SARS-CoV-2 RNA is generally detectable in upper and lower respiratory specimens during the acute phase of infection. Negative results do not preclude SARS-CoV-2 infection, do not rule out co-infections with other pathogens, and should not be used as the sole basis for treatment or other patient management decisions. Negative results must be combined with clinical observations, patient history, and epidemiological information. The expected result is Negative.  Fact Sheet for Patients: SugarRoll.be  Fact Sheet for Healthcare Providers: https://www.woods-mathews.com/  This test is not yet approved or cleared by the [place] FDA and  has been authorized for detection and/or diagnosis of SARS-CoV-2 by FDA under an Emergency Use Authorization (EUA). This EUA will remain  in effect  (meaning this test can be used) for the duration of the COVID-19 declaration under Se ction 564(b)(1) of the Act, 21 U.S.C. section 360bbb-3(b)(1), unless the authorization is terminated or revoked sooner.  Performed at [facility], [place] [address], [place], [place] [address]          Radiology Studies: DG Chest 2 View  Result Date: 07/22/2020 CLINICAL DATA:  Wheezing EXAM: CHEST - 2 VIEW COMPARISON:  05/18/2020 FINDINGS: Heart is upper limits normal in size. Right basilar density again noted, stable, likely scarring. No confluent airspace opacities otherwise. No effusions or acute bony abnormality. IMPRESSION: Right basilar scarring. No acute cardiopulmonary disease. Electronically Signed   By: Rolm Baptise M.D.   On: 07/22/2020 07:37        Scheduled Meds: . amoxicillin-clavulanate  1 tablet Oral BID  . apixaban  5 mg Oral BID  . atorvastatin  80 mg Oral Daily  . clopidogrel  75 mg Oral Q breakfast  . furosemide  80 mg Intravenous BID  . metoprolol tartrate  37.5 mg Oral BID  . potassium chloride  40 mEq Oral BID   Continuous Infusions:   LOS: 1 day    Time spent: 25 minutes    Edwin Dada, MD [facility] 07/23/2020, 11:57 AM     Please page though [place] or Epic secure chat:  For [employer], Adult nurse

## 2020-07-24 DIAGNOSIS — I5033 Acute on chronic diastolic (congestive) heart failure: Secondary | ICD-10-CM | POA: Diagnosis not present

## 2020-07-24 LAB — GLUCOSE, CAPILLARY: Glucose-Capillary: 119 mg/dL — ABNORMAL HIGH (ref 70–99)

## 2020-07-24 LAB — BASIC METABOLIC PANEL
Anion gap: 9 (ref 5–15)
BUN: 23 mg/dL (ref 8–23)
CO2: 27 mmol/L (ref 22–32)
Calcium: 9.2 mg/dL (ref 8.9–10.3)
Chloride: 99 mmol/L (ref 98–111)
Creatinine, Ser: 1.04 mg/dL (ref 0.61–1.24)
GFR, Estimated: >60 mL/min (ref >60)
Glucose, Bld: 102 mg/dL — ABNORMAL HIGH (ref 70–99)
Potassium: 3.9 mmol/L (ref 3.5–5.1)
Sodium: 135 mmol/L (ref 135–145)

## 2020-07-24 NOTE — Progress Notes (Signed)
[facility] PROGRESS NOTE    Ryan Garner  OMV:[id number] DOB: 04-30-1946 DOA: 07/21/2020 PCP: Ryan Garner, Ryan Garner      Brief Narrative:  Ryan Garner is a 75 y.o. M with hx CAD s/p DES 2 months ago for STEMI, Afib on eliquis, morbid obesity, OSA and diet controlled DM who presents with swelling progressive over weeks.  Patient for started noticing increasing swelling "many weeks ago".  This gradually progressed until it was difficult to walk due to leg swelling, he had orthopnea, and started to have progressive severe dyspnea with exertion.  Finally went to see his cardiologist, who noted slightly increased BNP, normal renal function and electrolytes and blood counts, ECG with rate controlled A. fib, no ST changes, and severe fluid overload.  His cardiologist doubled his Lasix and recommended direct admission to the hospital for IV Lasix given the severity of the swelling.          Assessment & Plan:  Acute on chronic diastolic CHF Net negative 7 lbs yesterday, I/Os not accurate.  Net negative 19lbs overall. Cr and K normal.  -Continue Lasix 40 IV twice daily -Continue potassium -Continue strict intake and output, daily BMP -Continue metoprolol      Sinusitis Started treatment prior to admission. Improving. -Continue outpatient treatment with Augmentin, day 7 of 10  Permanent atrial fibrillation Rate controlled -Continue apixaban, metoprolol  Coronary artery disease, secondary prevention -Continue atorvastatin, Plavix, metoprolol  Diet-controlled diabetes A1c actually 6.5%, but appears diet controlled, glucoses here normal -Defer CBGs and SSI   Morbid obesity BMI 47  Sleep apnea -Home CPAP       Disposition: Status is: Inpatient  Remains inpatient appropriate because:IV treatments appropriate due to intensity of illness or inability to take PO   Dispo: The patient is from: Home              Anticipated d/c is to:  Home              Anticipated d/c date is: 3 days              Patient currently is not medically stable to d/c.       The patient was admitted with fluid overload and severe dyspnea and wheezing in the setting of chronic diastolic CHF.  He Continue to diurese well, but still has significant fluid overload on exam.         MDM: The below labs and imaging reports were reviewed and summarized above.  Medication management as above.    DVT prophylaxis:  apixaban (ELIQUIS) tablet 5 mg  Code Status: FULL Family Communication:           Subjective: Swelling no change.  Breathing same as yesterday.  Making good urine.  No fever, headche.  No confusion, palpitations, chest pain.        Objective: Vitals:   07/23/20 1500 07/23/20 2051 07/24/20 0541 07/24/20 1432  BP: 122/76 114/75 120/68 103/66  Pulse: 86 80 72 (!) 58  Resp: 16 17 18    Temp: 98.1 F (36.7 C) (!) 97.5 F (36.4 C) 97.7 F (36.5 C) 98.2 F (36.8 C)  TempSrc: Oral   Oral  SpO2: 95% 98% 97% 97%  Weight:   132.1 kg   Height:        Intake/Output Summary (Last 24 hours) at 07/24/2020 1755 Last data filed at 07/23/2020 1900 Gross per 24 hour  Intake 240 ml  Output -  Net 240 ml  Filed Weights   07/22/20 0500 07/23/20 0456 07/24/20 0541  Weight: (!) 139 kg 135.3 kg 132.1 kg    Examination: General appearance: Obese adult male, sitting on side of bed, interactive HEENT: Anicteric, conjunctival pink, lids and lashes normal.  No nasal deformity, discharge, or epistaxis, lips moist, dentition in good repair, oropharynx moist, no oral lesions, hearing normal Skin: Skin warm and dry, no suspicious rashes or lesions Cardiac: RRR, no murmurs, Still 3+ lower extremity edema to knee. Respiratory: Normal respiratory rate and rhythm at rest, no rales or wheezing Abdomen: Abdomen soft without tenderness palpation or guarding, no ascites or distention MSK: No deformities or effusions. Neuro: Awake and  alert, extraocular movements, moves all extremities with normal strength and coordination, speech fluent.    Psych: Sensorium intact responding questions, attention normal, affect normal, judgment site appear normal.    Data Reviewed: I have personally reviewed following labs and imaging studies:  CBC: Recent Labs  Lab 07/20/20 1528 07/22/20 0616  WBC 9.3 8.5  HGB 13.6 12.9*  HCT 43.3 41.0  MCV 91.2 91.7  PLT 251 0000000   Basic Metabolic Panel: Recent Labs  Lab 07/20/20 1528 07/21/20 1628 07/22/20 0616 07/23/20 0651 07/24/20 0551  NA 137 138 137 139 135  K 4.2 4.0 3.7 4.1 3.9  CL 105 103 101 99 99  CO2 22 24 28 30 27   GLUCOSE 165* 164* 113* 91 102*  BUN 26* 28* 27* 23 23  CREATININE 1.12 1.05 1.00 1.01 1.04  CALCIUM 8.9 8.8* 9.1 9.4 9.2   GFR: Estimated Creatinine Clearance: 80.3 mL/min (by C-G formula based on SCr of 1.04 mg/dL). Liver Function Tests: Recent Labs  Lab 07/20/20 1528  AST 31  ALT 43  ALKPHOS 74  BILITOT 0.6  PROT 6.3*  ALBUMIN 3.5   No results for input(s): LIPASE, AMYLASE in the last 168 hours. No results for input(s): AMMONIA in the last 168 hours. Coagulation Profile: No results for input(s): INR, PROTIME in the last 168 hours. Cardiac Enzymes: No results for input(s): CKTOTAL, CKMB, CKMBINDEX, TROPONINI in the last 168 hours. BNP (last 3 results) No results for input(s): PROBNP in the last 8760 hours. HbA1C: No results for input(s): HGBA1C in the last 72 hours. CBG: Recent Labs  Lab 07/21/20 1603 07/21/20 2133 07/22/20 0809 07/22/20 1119 07/22/20 1648  GLUCAP 154* 145* 86 153* 136*   Lipid Profile: No results for input(s): CHOL, HDL, LDLCALC, TRIG, CHOLHDL, LDLDIRECT in the last 72 hours. Thyroid Function Tests: No results for input(s): TSH, T4TOTAL, FREET4, T3FREE, THYROIDAB in the last 72 hours. Anemia Panel: No results for input(s): VITAMINB12, FOLATE, FERRITIN, TIBC, IRON, RETICCTPCT in the last 72 hours. Urine analysis:     Component Value Date/Time   COLORURINE YELLOW 02/28/2020 1440   APPEARANCEUR CLOUDY (A) 02/28/2020 1440   LABSPEC 1.019 02/28/2020 1440   PHURINE 5.0 02/28/2020 1440   GLUCOSEU NEGATIVE 02/28/2020 1440   HGBUR NEGATIVE 02/28/2020 1440   [place] 02/28/2020 1440   BILIRUBINUR neg 08/27/2014 0841   KETONESUR NEGATIVE 02/28/2020 1440   PROTEINUR NEGATIVE 02/28/2020 1440   UROBILINOGEN negative 08/27/2014 0841   NITRITE NEGATIVE 02/28/2020 1440   LEUKOCYTESUR NEGATIVE 02/28/2020 1440   Sepsis Labs: @LABRCNTIP (procalcitonin:4,lacticacidven:4)  ) Recent Results (from the past 240 hour(s))  SARS CORONAVIRUS 2 (TAT 6-24 HRS) Nasopharyngeal Nasopharyngeal Swab     Status: None   Collection Time: 07/21/20  3:30 PM   Specimen: Nasopharyngeal Swab  Result Value Ref Range Status   SARS Coronavirus  2 NEGATIVE NEGATIVE Final    Comment: (NOTE) SARS-CoV-2 target nucleic acids are NOT DETECTED.  The SARS-CoV-2 RNA is generally detectable in upper and lower respiratory specimens during the acute phase of infection. Negative results do not preclude SARS-CoV-2 infection, do not rule out co-infections with other pathogens, and should not be used as the sole basis for treatment or other patient management decisions. Negative results must be combined with clinical observations, patient history, and epidemiological information. The expected result is Negative.  Fact Sheet for Patients: SugarRoll.be  Fact Sheet for Healthcare Providers: https://www.woods-mathews.com/  This test is not yet approved or cleared by the [place] FDA and  has been authorized for detection and/or diagnosis of SARS-CoV-2 by FDA under an Emergency Use Authorization (EUA). This EUA will remain  in effect (meaning this test can be used) for the duration of the COVID-19 declaration under Se ction 564(b)(1) of the Act, 21 U.S.C. section 360bbb-3(b)(1), unless the  authorization is terminated or revoked sooner.  Performed at [facility], [place] [address] [place], [place] [address]          Radiology Studies: No results found.      Scheduled Meds: . amoxicillin-clavulanate  1 tablet Oral BID  . apixaban  5 mg Oral BID  . atorvastatin  80 mg Oral Daily  . clopidogrel  75 mg Oral Q breakfast  . furosemide  80 mg Intravenous BID  . metoprolol tartrate  37.5 mg Oral BID  . potassium chloride  40 mEq Oral BID   Continuous Infusions:   LOS: 2 days    Time spent: 25 minutes    Edwin Dada, Garner [facility] 07/24/2020, 5:55 PM     Please page though [place] or Epic secure chat:  For [employer], Adult nurse

## 2020-07-24 NOTE — Discharge Instructions (Signed)

## 2020-07-24 NOTE — Plan of Care (Signed)
Nutrition Education Note  RD consulted for nutrition education regarding new onset CHF. RD working remotely.  Able to speak with patient via phone this afternoon. He reports doing well today, ate 100% of pancakes and oatmeal for breakfast. Patient reports mostly enjoying his meals, states his dinner the other night was cold and did not eat very much. Patient typically eats 2 meals daily at baseline. Recalls a bowl of cereal with 2% milk for breakfast and eats an early dinner around 3pm. Dinner meals are often out at [employer], recalls broiled cajun flounder, meatloaf, salads. At home, wife prepares meals, recalls meat with canned vegetables from his garden. He does not add salt to foods at the table, says he prefers lots of pepper. Patient reports drinking 2-3 L of water every day.   RD provided "Low Sodium Nutrition Therapy" handout from the Academy of Nutrition and Dietetics. Informed patient handout with contact information has been attached to discharge instructions. Reviewed patient's dietary recall. Provided examples on ways to decrease sodium intake in diet. Discouraged intake of processed foods and use of salt shaker. Encouraged fresh fruits and vegetables as well as whole grain sources of carbohydrates to maximize fiber intake.   RD discussed why it is important for patient to adhere to diet recommendations, and emphasized the role of fluids, foods to avoid, and importance of weighing self daily. Teach back method used.  Expect good compliance.  Body mass index is 44.28 kg/m. Pt meets criteria for morbid obesity based on current BMI.  Current diet order is HH/CM, patient is consuming approximately 75-100% of meals at this time. Labs and medications reviewed. No further nutrition interventions warranted at this time. RD contact information provided. If additional nutrition issues arise, please re-consult RD.   Lars Masson, RD, LDN Clinical Nutrition After Hours/Weekend Pager # in  Amion

## 2020-07-25 ENCOUNTER — Encounter (HOSPITAL_COMMUNITY): Payer: Medicare Other

## 2020-07-25 DIAGNOSIS — I4819 Other persistent atrial fibrillation: Secondary | ICD-10-CM | POA: Diagnosis not present

## 2020-07-25 DIAGNOSIS — I5033 Acute on chronic diastolic (congestive) heart failure: Secondary | ICD-10-CM | POA: Diagnosis not present

## 2020-07-25 DIAGNOSIS — I482 Chronic atrial fibrillation, unspecified: Secondary | ICD-10-CM

## 2020-07-25 LAB — BASIC METABOLIC PANEL
Anion gap: 10 (ref 5–15)
BUN: 25 mg/dL — ABNORMAL HIGH (ref 8–23)
CO2: 29 mmol/L (ref 22–32)
Calcium: 9.2 mg/dL (ref 8.9–10.3)
Chloride: 96 mmol/L — ABNORMAL LOW (ref 98–111)
Creatinine, Ser: 1.23 mg/dL (ref 0.61–1.24)
GFR, Estimated: >60 mL/min (ref >60)
Glucose, Bld: 115 mg/dL — ABNORMAL HIGH (ref 70–99)
Potassium: 3.6 mmol/L (ref 3.5–5.1)
Sodium: 135 mmol/L (ref 135–145)

## 2020-07-25 MED ORDER — FUROSEMIDE 40 MG PO TABS: 40.0000 mg | ORAL_TABLET | Freq: Two times a day (BID) | ORAL | Status: DC

## 2020-07-25 MED ORDER — FUROSEMIDE 40 MG PO TABS: 40.0000 mg | ORAL_TABLET | Freq: Two times a day (BID) | ORAL | 1 refills | Status: DC

## 2020-07-25 MED ORDER — METOLAZONE 5 MG PO TABS: 5.0000 mg | ORAL_TABLET | ORAL | Status: DC | PRN

## 2020-07-25 MED ORDER — METOLAZONE 5 MG PO TABS: 5.0000 mg | ORAL_TABLET | ORAL | 1 refills | Status: DC | PRN

## 2020-07-25 MED ORDER — POTASSIUM CHLORIDE CRYS ER 20 MEQ PO TBCR: 20.0000 meq | EXTENDED_RELEASE_TABLET | Freq: Every day | ORAL | 1 refills | Status: DC

## 2020-07-25 NOTE — Discharge Summary (Signed)
Physician Discharge Summary  TINSLEY LOMAS PRF:[id number] DOB: Oct 04, 1945 DOA: 07/21/2020  PCP: [name], [name], MD  Admit date: 07/21/2020 Discharge date: 07/25/2020  Admitted From: Home Disposition:  Home   Recommendations for Outpatient Follow-up:  1. Follow up with PCP in 1-2 weeks 2. Please obtain BMP/CBC in one week     Discharge Condition: Stable CODE STATUS: FULL Diet recommendation: Heart Healthy    Brief/Interim Summary: 75 y.o.Mwith hx CAD s/p DES 2 months ago for STEMI, Afib on eliquis, morbid obesity, OSA and diet controlled DMwho presents with swelling progressive over weeks.  Patient for started noticing increasing swelling "many weeks ago". This gradually progressed until it was difficult to walk due to leg swelling, he had orthopnea, and started to have progressive severe dyspnea with exertion.  Finally went to see his cardiologist, who noted slightly increased BNP, normal renal function and electrolytes and blood counts, ECG with rate controlled A. fib, no ST changes, and severe fluid overload. His cardiologist doubled his Lasix and recommended direct admission to the hospital for IV Lasix given the severity of the swelling.  Discharge Diagnoses:  Acute on chronic diastolic CHF Net negative 7 lbs yesterday, I/Os not accurate.  Net negative 19lbs overall. Cr and K normal.  -Continue Lasix 40 IV twice daily>>d/c home with lasix 40 mg po bid -Continue potassium -Continue strict intake and output, daily BMP -Continue metoprolol -weight 306>>287 lbs  Sinusitis Started treatment prior to admission. Improving. -Continue outpatient treatment with Augmentin, day 7 of 10  Permanent atrial fibrillation Rate controlled -Continue apixaban, metoprolol  Coronary artery disease, secondary prevention -Continue atorvastatin, Plavix, metoprolol -no chest pain  Diet-controlled diabetes A1c actually 6.5%, but appears diet controlled, glucoses here  normal -Defer CBGs and SSI   Morbid obesity BMI 47 -lifestyle modification  Sleep apnea -Home CPAP  Hyperlipidemia -continue statin   Discharge Instructions   Allergies as of 07/25/2020      Reactions   Rofecoxib Other (See Comments)   (Vioxx)      Medication List    STOP taking these medications   amoxicillin-clavulanate 875-125 MG tablet Commonly known as: AUGMENTIN     TAKE these medications   apixaban 5 MG Tabs tablet Commonly known as: ELIQUIS Take 1 tablet (5 mg total) by mouth 2 (two) times daily.   atorvastatin 80 MG tablet Commonly known as: LIPITOR Take 1 tablet (80 mg total) by mouth daily.   clopidogrel 75 MG tablet Commonly known as: PLAVIX Take 1 tablet (75 mg total) by mouth daily with breakfast.   fluticasone 50 MCG/ACT nasal spray Commonly known as: FLONASE Place 2 sprays into both nostrils daily. What changed:   when to take this  reasons to take this   furosemide 40 MG tablet Commonly known as: LASIX Take 1 tablet (40 mg total) by mouth 2 (two) times daily. What changed: when to take this   metolazone 5 MG tablet Commonly known as: ZAROXOLYN Take 1 tablet (5 mg total) by mouth as needed (dypsnea or weight up 3 lbs).   metoprolol tartrate 25 MG tablet Commonly known as: LOPRESSOR Take 1.5 tablets (37.5 mg total) by mouth 2 (two) times daily.   multivitamin with minerals Tabs tablet Take 1 tablet by mouth daily.   nitroGLYCERIN 0.4 MG SL tablet Commonly known as: NITROSTAT Place 1 tablet (0.4 mg total) under the tongue every 5 (five) minutes x 3 doses as needed for chest pain.   potassium chloride SA 20 MEQ tablet Commonly known as:  KLOR-CON Take 1 tablet (20 mEq total) by mouth daily.   Prostate Support 300-15 MG Tabs Take 1 tablet by mouth daily. Super Beta Prostate       Allergies  Allergen Reactions  . Rofecoxib Other (See Comments)    (Vioxx)    Consultations:  none   Procedures/Studies: DG Chest 2  View  Result Date: 07/22/2020 CLINICAL DATA:  Wheezing EXAM: CHEST - 2 VIEW COMPARISON:  05/18/2020 FINDINGS: Heart is upper limits normal in size. Right basilar density again noted, stable, likely scarring. No confluent airspace opacities otherwise. No effusions or acute bony abnormality. IMPRESSION: Right basilar scarring. No acute cardiopulmonary disease. Electronically Signed   By: Rolm Baptise M.D.   On: 07/22/2020 07:37         Discharge Exam: Vitals:   07/24/20 2047 07/25/20 0531  BP: 114/77 (!) 121/58  Pulse: 82 72  Resp: 18 18  Temp: 98.3 F (36.8 C) 97.7 F (36.5 C)  SpO2: 100% 98%   Vitals:   07/24/20 1432 07/24/20 2047 07/25/20 0531 07/25/20 0755  BP: 103/66 114/77 (!) 121/58   Pulse: (!) 58 82 72   Resp:  18 18   Temp: 98.2 F (36.8 C) 98.3 F (36.8 C) 97.7 F (36.5 C)   TempSrc: Oral Oral Oral   SpO2: 97% 100% 98%   Weight:    130.2 kg  Height:        General: Pt is alert, awake, not in acute distress Cardiovascular: RRR, S1/S2 +, no rubs, no gallops Respiratory: CTA bilaterally, no wheezing, no rhonchi Abdominal: Soft, NT, ND, bowel sounds + Extremities: 2 + LE edema, no cyanosis   The results of significant diagnostics from this hospitalization (including imaging, microbiology, ancillary and laboratory) are listed below for reference.    Significant Diagnostic Studies: DG Chest 2 View  Result Date: 07/22/2020 CLINICAL DATA:  Wheezing EXAM: CHEST - 2 VIEW COMPARISON:  05/18/2020 FINDINGS: Heart is upper limits normal in size. Right basilar density again noted, stable, likely scarring. No confluent airspace opacities otherwise. No effusions or acute bony abnormality. IMPRESSION: Right basilar scarring. No acute cardiopulmonary disease. Electronically Signed   By: Rolm Baptise M.D.   On: 07/22/2020 07:37     Microbiology: Recent Results (from the past 240 hour(s))  SARS CORONAVIRUS 2 (Ajani Schnieders 6-24 HRS) Nasopharyngeal Nasopharyngeal Swab     Status:  None   Collection Time: 07/21/20  3:30 PM   Specimen: Nasopharyngeal Swab  Result Value Ref Range Status   SARS Coronavirus 2 NEGATIVE NEGATIVE Final    Comment: (NOTE) SARS-CoV-2 target nucleic acids are NOT DETECTED.  The SARS-CoV-2 RNA is generally detectable in upper and lower respiratory specimens during the acute phase of infection. Negative results do not preclude SARS-CoV-2 infection, do not rule out co-infections with other pathogens, and should not be used as the sole basis for treatment or other patient management decisions. Negative results must be combined with clinical observations, patient history, and epidemiological information. The expected result is Negative.  Fact Sheet for Patients: SugarRoll.be  Fact Sheet for Healthcare Providers: https://www.woods-mathews.com/  This test is not yet approved or cleared by the [place] FDA and  has been authorized for detection and/or diagnosis of SARS-CoV-2 by FDA under an Emergency Use Authorization (EUA). This EUA will remain  in effect (meaning this test can be used) for the duration of the COVID-19 declaration under Se ction 564(b)(1) of the Act, 21 U.S.C. section 360bbb-3(b)(1), unless the authorization is terminated or revoked sooner.  Performed at [facility], [place] [address], [place], [place] [address]      Labs: Basic Metabolic Panel: Recent Labs  Lab 07/21/20 1628 07/22/20 0616 07/23/20 0651 07/24/20 0551 07/25/20 0503  NA 138 137 139 135 135  K 4.0 3.7 4.1 3.9 3.6  CL 103 101 99 99 96*  CO2 24 28 30 27 29   GLUCOSE 164* 113* 91 102* 115*  BUN 28* 27* 23 23 25*  CREATININE 1.05 1.00 1.01 1.04 1.23  CALCIUM 8.8* 9.1 9.4 9.2 9.2   Liver Function Tests: Recent Labs  Lab 07/20/20 1528  AST 31  ALT 43  ALKPHOS 74  BILITOT 0.6  PROT 6.3*  ALBUMIN 3.5   No results for input(s): LIPASE, AMYLASE in the last 168 hours. No results for input(s):  AMMONIA in the last 168 hours. CBC: Recent Labs  Lab 07/20/20 1528 07/22/20 0616  WBC 9.3 8.5  HGB 13.6 12.9*  HCT 43.3 41.0  MCV 91.2 91.7  PLT 251 258   Cardiac Enzymes: No results for input(s): CKTOTAL, CKMB, CKMBINDEX, TROPONINI in the last 168 hours. BNP: Invalid input(s): POCBNP CBG: Recent Labs  Lab 07/21/20 2133 07/22/20 0809 07/22/20 1119 07/22/20 1648 07/24/20 2118  GLUCAP 145* 86 153* 136* 119*    Time coordinating discharge:  36 minutes  Signed:  Orson Eva, DO [facility] Pager: [phone] 07/25/2020, 11:50 AM

## 2020-07-25 NOTE — Progress Notes (Signed)
    Subjective:  Dyspnea improved "my swelling never goes away" Wants to go home to care for wife   Objective:  Vitals:   07/24/20 1432 07/24/20 2047 07/25/20 0531 07/25/20 0755  BP: 103/66 114/77 (!) 121/58   Pulse: (!) 58 82 72   Resp:  18 18   Temp: 98.2 F (36.8 C) 98.3 F (36.8 C) 97.7 F (36.5 C)   TempSrc: Oral Oral Oral   SpO2: 97% 100% 98%   Weight:    130.2 kg  Height:        Intake/Output from previous day:  Intake/Output Summary (Last 24 hours) at 07/25/2020 1112 Last data filed at 07/25/2020 0559 Gross per 24 hour  Intake 240 ml  Output 2250 ml  Net -2010 ml    Physical Exam: Obese male Laying flat with legs elevated  Lungs clear MR murmur apex Plus 2 bilateral LE edema    Lab Results: Basic Metabolic Panel: Recent Labs    07/24/20 0551 07/25/20 0503  NA 135 135  K 3.9 3.6  CL 99 96*  CO2 27 29  GLUCOSE 102* 115*  BUN 23 25*  CREATININE 1.04 1.23  CALCIUM 9.2 9.2    Imaging: No results found.  Cardiac Studies:  ECG: afib rate 82 ICRBBB    Telemetry: afib chronic   Echo: 05/01/20 EF 60-65% mild/moderate MR   Medications:   . amoxicillin-clavulanate  1 tablet Oral BID  . apixaban  5 mg Oral BID  . atorvastatin  80 mg Oral Daily  . clopidogrel  75 mg Oral Q breakfast  . furosemide  80 mg Intravenous BID  . metoprolol tartrate  37.5 mg Oral BID  . potassium chloride  40 mEq Oral BID      Assessment/Plan:   1. CHF:  Diastolic improved Previously on lasix 40 mg daily Increase to 40 mg bid and add PRN zaroxyln 5mg  on d/c to take 30 minutes before am lasix dose for weight increase more than 3 lbs will arrange outpatient f/u Dr with BMET in 3 weeks  2. Afib:  Chronic rate control ok on current dose Toprol severe LAE. Continue eliquis   3. CAD:  04/30/20 DES to circumflex ? Contributes to ischemic MR Due to chronic afib and need for anticoagulation is on monoRx with plavix. No angina   4. MR:  Mild to moderate on TTE October  needs repeat as outpatient murmur prominent on exam   Ok to d/c home today weather permitting as patient needs to care for wife at home who has had multiple strokes   November 07/25/2020, 11:12 AM

## 2020-07-26 ENCOUNTER — Other Ambulatory Visit: Payer: Self-pay

## 2020-07-26 ENCOUNTER — Ambulatory Visit: Payer: Medicare Other | Admitting: Family Medicine

## 2020-07-26 ENCOUNTER — Telehealth: Payer: Self-pay | Admitting: *Deleted

## 2020-07-26 DIAGNOSIS — Z79899 Other long term (current) drug therapy: Secondary | ICD-10-CM

## 2020-07-26 NOTE — Telephone Encounter (Signed)
Patient contacted regarding discharge from [facility] on 07/25/20.  Patient understands to follow up with provider Dr. Tenny Craw  on 08/08/20 at 3:20 pm  at [facility] . Patient understands discharge instructions? Yes  Patient understands medications and regiment? Yes  Patient understands to bring all medications to this visit? Yes   Ask patient:  Are you enrolled in My Chart (yes or no)  If no ask patient if they would like to enroll.

## 2020-07-26 NOTE — Addendum Note (Signed)
Addended by: Kerney Elbe on: 07/26/2020 09:32 AM   Modules accepted: Orders

## 2020-07-27 ENCOUNTER — Encounter (HOSPITAL_COMMUNITY): Payer: Medicare Other

## 2020-07-29 ENCOUNTER — Encounter (HOSPITAL_COMMUNITY): Payer: Medicare Other

## 2020-08-01 ENCOUNTER — Encounter (HOSPITAL_COMMUNITY): Payer: Medicare Other

## 2020-08-03 ENCOUNTER — Telehealth: Payer: Medicare Other

## 2020-08-03 ENCOUNTER — Encounter (HOSPITAL_COMMUNITY): Payer: Medicare Other

## 2020-08-03 NOTE — Progress Notes (Signed)
Cardiac Individual Treatment Plan  Patient Details  Name: Ryan Garner MRN: [id number] Date of Birth: 1946/03/29 Referring Provider:   Flowsheet Row CARDIAC REHAB PHASE II ORIENTATION from 06/08/2020 in [facility]  Referring Provider Dr. Harrington Challenger      Initial Encounter Date:  Flowsheet Row CARDIAC REHAB PHASE II ORIENTATION from 06/08/2020 in [facility]  Date 06/08/20      Visit Diagnosis: S/P coronary artery stent placement  ST elevation myocardial infarction involving left circumflex coronary artery ([place])  Patient's Home Medications on Admission:  Current Outpatient Medications:  .  amoxicillin-clavulanate (AUGMENTIN) 875-125 MG tablet, Take 1 tablet by mouth 2 (two) times daily. Take all of this medication, Disp: 28 tablet, Rfl: 0 .  apixaban (ELIQUIS) 5 MG TABS tablet, Take 1 tablet (5 mg total) by mouth 2 (two) times daily., Disp: 28 tablet, Rfl: 0 .  atorvastatin (LIPITOR) 80 MG tablet, Take 1 tablet (80 mg total) by mouth daily., Disp: 90 tablet, Rfl: 1 .  clopidogrel (PLAVIX) 75 MG tablet, Take 1 tablet (75 mg total) by mouth daily with breakfast., Disp: 90 tablet, Rfl: 2 .  fluticasone (FLONASE) 50 MCG/ACT nasal spray, Place 2 sprays into both nostrils daily. (Patient taking differently: Place 2 sprays into both nostrils daily as needed for allergies.), Disp: 16 g, Rfl: 6 .  furosemide (LASIX) 40 MG tablet, Take 1 tablet (40 mg total) by mouth 2 (two) times daily., Disp: 60 tablet, Rfl: 1 .  metolazone (ZAROXOLYN) 5 MG tablet, Take 1 tablet (5 mg total) by mouth as needed (dypsnea or weight up 3 lbs)., Disp: 30 tablet, Rfl: 1 .  metoprolol tartrate (LOPRESSOR) 25 MG tablet, Take 1.5 tablets (37.5 mg total) by mouth 2 (two) times daily., Disp: 90 tablet, Rfl: 6 .  Misc Natural Products (PROSTATE SUPPORT) 300-15 MG TABS, Take 1 tablet by mouth daily. Super Beta Prostate, Disp: , Rfl:  .  Multiple Vitamin (MULTIVITAMIN WITH MINERALS)  TABS tablet, Take 1 tablet by mouth daily., Disp: , Rfl:  .  nitroGLYCERIN (NITROSTAT) 0.4 MG SL tablet, Place 1 tablet (0.4 mg total) under the tongue every 5 (five) minutes x 3 doses as needed for chest pain., Disp: 25 tablet, Rfl: 2 .  potassium chloride SA (KLOR-CON) 20 MEQ tablet, Take 1 tablet (20 mEq total) by mouth daily., Disp: 30 tablet, Rfl: 1  Past Medical History: Past Medical History:  Diagnosis Date  . A-fib ([place])   . Acute on chronic diastolic CHF (congestive heart failure) ([place]) 07/21/2020  . Hyperlipidemia   . Hypertension   . Metabolic syndrome   . Prediabetes   . STEMI (ST elevation myocardial infarction) ([place])     Tobacco Use: Social History   Tobacco Use  Smoking Status Former Smoker  . Types: Cigarettes  . Quit date: 04/02/1959  . Years since quitting: 61.3  Smokeless Tobacco Never Used    Labs: Recent Chemical engineer    Labs for ITP Cardiac and Pulmonary Rehab Latest Ref Rng & Units 09/04/2018 03/01/2020 04/30/2020 05/01/2020 07/21/2020   Cholestrol 0 - 200 mg/dL 166 - 123 122 -   LDLCALC 0 - 99 mg/dL 108(H) - 78 79 -   HDL >40 mg/dL 42 - 27(L) 30(L) -   Trlycerides <150 mg/dL 82 - 88 63 -   Hemoglobin A1c 4.8 - 5.6 % 5.8 6.0(H) 6.2(H) - 6.5(H)   TCO2 22 - 32 mmol/L - - 21(L) - -      Capillary Blood  Glucose: Lab Results  Component Value Date   GLUCAP 119 (H) 07/24/2020   GLUCAP 136 (H) 07/22/2020   GLUCAP 153 (H) 07/22/2020   GLUCAP 86 07/22/2020   GLUCAP 145 (H) 07/21/2020     Exercise Target Goals: Exercise Program Goal: Individual exercise prescription set using results from initial 6 min walk test and THRR while considering  patient's activity barriers and safety.   Exercise Prescription Goal: Starting with aerobic activity 30 plus minutes a day, 3 days per week for initial exercise prescription. Provide home exercise prescription and guidelines that participant acknowledges understanding prior to discharge.  Activity Barriers &  Risk Stratification:  Activity Barriers & Cardiac Risk Stratification - 07/05/20 1215      Activity Barriers & Cardiac Risk Stratification   Activity Barriers Arthritis;Back Problems;Deconditioning;Joint Problems;Balance Concerns;History of Falls    Cardiac Risk Stratification High           6 Minute Walk:  6 Minute Walk    Row Name 06/08/20 1408         6 Minute Walk   Phase Initial     Distance 700 feet     Walk Time 6 minutes     # of Rest Breaks 2     MPH 1.33     METS 0.31     RPE 15     Perceived Dyspnea  15     VO2 Peak 1.07     Symptoms Yes (comment)     Comments In afib during the test. Took 2 standing rest breaks for 45 and 30 seconds due to SOB     Resting HR 74 bpm     Resting BP 100/70     Resting Oxygen Saturation  97 %     Exercise Oxygen Saturation  during 6 min walk 97 %     Max Ex. HR 99 bpm     Max Ex. BP 112/64     2 Minute Post BP 102/62            Oxygen Initial Assessment:   Oxygen Re-Evaluation:   Oxygen Discharge (Final Oxygen Re-Evaluation):   Initial Exercise Prescription:  Initial Exercise Prescription - 06/08/20 1400      Date of Initial Exercise RX and Referring Provider   Date 06/08/20    Referring Provider Dr. Tenny Crawoss    Expected Discharge Date 09/16/20      NuStep   Level 1    SPM 60    Minutes 22      Arm Ergometer   Level 1    RPM 60    Minutes 17      Prescription Details   Frequency (times per week) 3    Duration Progress to 30 minutes of continuous aerobic without signs/symptoms of physical distress      Intensity   THRR 40-80% of Max Heartrate 58-115    Ratings of Perceived Exertion 11-13    Perceived Dyspnea 0-4      Progression   Progression Continue progressive overload as per policy without signs/symptoms or physical distress.      Resistance Training   Training Prescription Yes    Weight 3 lbs    Reps 10-15           Perform Capillary Blood Glucose checks as needed.  Exercise  Prescription Changes:   Exercise Prescription Changes    Row Name 07/01/20 0741 07/18/20 1445           Response to Exercise  Blood Pressure (Admit) 128/70 128/80      Blood Pressure (Exercise) 118/70 134/88      Blood Pressure (Exit) 112/76 118/86      Heart Rate (Admit) 91 bpm 72 bpm      Heart Rate (Exercise) 110 bpm 100 bpm      Heart Rate (Exit) 96 bpm 77 bpm      Rating of Perceived Exertion (Exercise) 12 12      Duration Continue with 30 min of aerobic exercise without signs/symptoms of physical distress. Continue with 30 min of aerobic exercise without signs/symptoms of physical distress.      Intensity THRR unchanged THRR unchanged             Progression   Progression Continue to progress workloads to maintain intensity without signs/symptoms of physical distress. Continue to progress workloads to maintain intensity without signs/symptoms of physical distress.             Resistance Training   Training Prescription Yes Yes      Weight 3 lbs 3 lbs      Reps 10-15 10-15      Time -- 10 Minutes             NuStep   Level 1 1      SPM 83 84      Minutes 22 22      METs 2.1 2             Arm Ergometer   Level 1 1      RPM 48 47      Minutes 17 17      METs 1.4 1.4             Exercise Comments:   Exercise Comments    Row Name 06/27/20 1542           Exercise Comments Pt completed first exercise session. He tolerated exercise well and is excited to come back.              Exercise Goals and Review:   Exercise Goals    Row Name 06/08/20 1413 07/01/20 0743 08/01/20 1407         Exercise Goals   Increase Physical Activity Yes Yes Yes     Intervention Provide advice, education, support and counseling about physical activity/exercise needs.;Develop an individualized exercise prescription for aerobic and resistive training based on initial evaluation findings, risk stratification, comorbidities and participant's personal goals. Provide advice,  education, support and counseling about physical activity/exercise needs.;Develop an individualized exercise prescription for aerobic and resistive training based on initial evaluation findings, risk stratification, comorbidities and participant's personal goals. Provide advice, education, support and counseling about physical activity/exercise needs.;Develop an individualized exercise prescription for aerobic and resistive training based on initial evaluation findings, risk stratification, comorbidities and participant's personal goals.     Expected Outcomes Short Term: Attend rehab on a regular basis to increase amount of physical activity.;Long Term: Add in home exercise to make exercise part of routine and to increase amount of physical activity.;Long Term: Exercising regularly at least 3-5 days a week. Short Term: Attend rehab on a regular basis to increase amount of physical activity.;Long Term: Add in home exercise to make exercise part of routine and to increase amount of physical activity.;Long Term: Exercising regularly at least 3-5 days a week. Short Term: Attend rehab on a regular basis to increase amount of physical activity.;Long Term: Add in home exercise to make exercise part of routine and  to increase amount of physical activity.;Long Term: Exercising regularly at least 3-5 days a week.     Increase Strength and Stamina Yes Yes Yes     Intervention Provide advice, education, support and counseling about physical activity/exercise needs.;Develop an individualized exercise prescription for aerobic and resistive training based on initial evaluation findings, risk stratification, comorbidities and participant's personal goals. Provide advice, education, support and counseling about physical activity/exercise needs.;Develop an individualized exercise prescription for aerobic and resistive training based on initial evaluation findings, risk stratification, comorbidities and participant's personal  goals. Provide advice, education, support and counseling about physical activity/exercise needs.;Develop an individualized exercise prescription for aerobic and resistive training based on initial evaluation findings, risk stratification, comorbidities and participant's personal goals.     Expected Outcomes Short Term: Increase workloads from initial exercise prescription for resistance, speed, and METs.;Short Term: Perform resistance training exercises routinely during rehab and add in resistance training at home;Long Term: Improve cardiorespiratory fitness, muscular endurance and strength as measured by increased METs and functional capacity (6MWT) Short Term: Increase workloads from initial exercise prescription for resistance, speed, and METs.;Short Term: Perform resistance training exercises routinely during rehab and add in resistance training at home;Long Term: Improve cardiorespiratory fitness, muscular endurance and strength as measured by increased METs and functional capacity (6MWT) Short Term: Increase workloads from initial exercise prescription for resistance, speed, and METs.;Short Term: Perform resistance training exercises routinely during rehab and add in resistance training at home;Long Term: Improve cardiorespiratory fitness, muscular endurance and strength as measured by increased METs and functional capacity (6MWT)     Able to understand and use rate of perceived exertion (RPE) scale Yes Yes Yes     Intervention Provide education and explanation on how to use RPE scale Provide education and explanation on how to use RPE scale Provide education and explanation on how to use RPE scale     Expected Outcomes Short Term: Able to use RPE daily in rehab to express subjective intensity level;Long Term:  Able to use RPE to guide intensity level when exercising independently Short Term: Able to use RPE daily in rehab to express subjective intensity level;Long Term:  Able to use RPE to guide intensity  level when exercising independently Short Term: Able to use RPE daily in rehab to express subjective intensity level;Long Term:  Able to use RPE to guide intensity level when exercising independently     Knowledge and understanding of Target Heart Rate Range (THRR) Yes Yes Yes     Intervention Provide education and explanation of THRR including how the numbers were predicted and where they are located for reference Provide education and explanation of THRR including how the numbers were predicted and where they are located for reference Provide education and explanation of THRR including how the numbers were predicted and where they are located for reference     Expected Outcomes Short Term: Able to state/look up THRR;Long Term: Able to use THRR to govern intensity when exercising independently;Short Term: Able to use daily as guideline for intensity in rehab Short Term: Able to state/look up THRR;Long Term: Able to use THRR to govern intensity when exercising independently;Short Term: Able to use daily as guideline for intensity in rehab Short Term: Able to state/look up THRR;Long Term: Able to use THRR to govern intensity when exercising independently;Short Term: Able to use daily as guideline for intensity in rehab     Able to check pulse independently Yes Yes Yes     Intervention Provide education and demonstration on  how to check pulse in carotid and radial arteries.;Review the importance of being able to check your own pulse for safety during independent exercise Review the importance of being able to check your own pulse for safety during independent exercise;Provide education and demonstration on how to check pulse in carotid and radial arteries. Review the importance of being able to check your own pulse for safety during independent exercise;Provide education and demonstration on how to check pulse in carotid and radial arteries.     Expected Outcomes Long Term: Able to check pulse independently and  accurately;Short Term: Able to explain why pulse checking is important during independent exercise Short Term: Able to explain why pulse checking is important during independent exercise;Long Term: Able to check pulse independently and accurately Short Term: Able to explain why pulse checking is important during independent exercise;Long Term: Able to check pulse independently and accurately     Understanding of Exercise Prescription Yes Yes Yes     Intervention Provide education, explanation, and written materials on patient's individual exercise prescription Provide education, explanation, and written materials on patient's individual exercise prescription Provide education, explanation, and written materials on patient's individual exercise prescription     Expected Outcomes Short Term: Able to explain program exercise prescription;Long Term: Able to explain home exercise prescription to exercise independently Short Term: Able to explain program exercise prescription;Long Term: Able to explain home exercise prescription to exercise independently Short Term: Able to explain program exercise prescription;Long Term: Able to explain home exercise prescription to exercise independently            Exercise Goals Re-Evaluation :  Exercise Goals Re-Evaluation    Row Name 07/01/20 0744 08/01/20 1407           Exercise Goal Re-Evaluation   Exercise Goals Review Increase Physical Activity;Increase Strength and Stamina;Able to understand and use rate of perceived exertion (RPE) scale;Knowledge and understanding of Target Heart Rate Range (THRR);Able to check pulse independently;Understanding of Exercise Prescription Increase Physical Activity;Increase Strength and Stamina;Able to understand and use rate of perceived exertion (RPE) scale;Knowledge and understanding of Target Heart Rate Range (THRR);Able to check pulse independently;Understanding of Exercise Prescription      Comments Patient has completed 1  exerise session. He has tolerated exercise well. He is eager to come to rehab and has a positive attitude. He is currently exercising at 2.1 METs on the NuStep. Will continue to monitor and progress exercise as able. Pt has completed 5 exercise sessions. He has been frequently absent, so he has not progressed. He was recently hospitialized due to being fluid overloaded. While in the hospitial they were able to get over 20 lbs of fluid out of him. He has a follow up appointment with his cardiologist 08/08/2020 to be cleared to return to exercise. He eats out a lot and cosumes a lot of salt in his diet which greatly contributes to his fluid retention. He was exercising at 2.0 METs on the stepper before he was hospitialized. Will continue to monitor and progress as able.      Expected Outcomes Through exercise at rehab and with a home exercise plan, patient will achieve their goals. Through exercise at rehab and with a home exercise plan, patient will achieve their goals.              Discharge Exercise Prescription (Final Exercise Prescription Changes):  Exercise Prescription Changes - 07/18/20 1445      Response to Exercise   Blood Pressure (Admit) 128/80  Blood Pressure (Exercise) 134/88    Blood Pressure (Exit) 118/86    Heart Rate (Admit) 72 bpm    Heart Rate (Exercise) 100 bpm    Heart Rate (Exit) 77 bpm    Rating of Perceived Exertion (Exercise) 12    Duration Continue with 30 min of aerobic exercise without signs/symptoms of physical distress.    Intensity THRR unchanged      Progression   Progression Continue to progress workloads to maintain intensity without signs/symptoms of physical distress.      Resistance Training   Training Prescription Yes    Weight 3 lbs    Reps 10-15    Time 10 Minutes      NuStep   Level 1    SPM 84    Minutes 22    METs 2      Arm Ergometer   Level 1    RPM 47    Minutes 17    METs 1.4           Nutrition:  Target Goals:  Understanding of nutrition guidelines, daily intake of sodium 1500mg , cholesterol 200mg , calories 30% from fat and 7% or less from saturated fats, daily to have 5 or more servings of fruits and vegetables.  Biometrics:  Pre Biometrics - 07/05/20 0747      Pre Biometrics   Weight 137.7 kg    BMI (Calculated) 46.17            Nutrition Therapy Plan and Nutrition Goals:  Nutrition Therapy & Goals - 07/27/20 1126      Personal Nutrition Goals   Comments We will continue to provide heart healthy nutritional education through hand-outs.      Intervention Plan   Intervention Nutrition handout(s) given to patient.           Nutrition Assessments:  Nutrition Assessments - 06/08/20 1405      MEDFICTS Scores   Pre Score 41          MEDIFICTS Score Key:  ?70 Need to make dietary changes   40-70 Heart Healthy Diet  ? 40 Therapeutic Level Cholesterol Diet   Picture Your Plate Scores:  <42 Unhealthy dietary pattern with much room for improvement.  41-50 Dietary pattern unlikely to meet recommendations for good health and room for improvement.  51-60 More healthful dietary pattern, with some room for improvement.   >60 Healthy dietary pattern, although there may be some specific behaviors that could be improved.    Nutrition Goals Re-Evaluation:   Nutrition Goals Discharge (Final Nutrition Goals Re-Evaluation):   Psychosocial: Target Goals: Acknowledge presence or absence of significant depression and/or stress, maximize coping skills, provide positive support system. Participant is able to verbalize types and ability to use techniques and skills needed for reducing stress and depression.  Initial Review & Psychosocial Screening:  Initial Psych Review & Screening - 06/08/20 1409      Initial Review   Current issues with None Identified      Family Dynamics   Good Support System? Yes    Concerns Recent loss of child    Comments Patient lives with his wife of  46 years. He lost his daughter to [place] in September 2021. He says he and his wife are dealing with their grief through family and friends and their church family. He says his faith in God is getting him through and that he knows he will see his daughter again. He also has a son who lives in [place]  and has several grand-children. He denies any depression or anxiety but does acknowlege his grief. Will continue to monitor.      Barriers   Psychosocial barriers to participate in program Psychosocial barriers identified (see note)           Quality of Life Scores:  Quality of Life - 06/08/20 1407      Quality of Life   Select Quality of Life      Quality of Life Scores   Health/Function Pre 19.03 %    Socioeconomic Pre 27.36 %    Psych/Spiritual Pre 27.86 %    Family Pre 25.2 %    GLOBAL Pre 23.47 %          Scores of 19 and below usually indicate a poorer quality of life in these areas.  A difference of  2-3 points is a clinically meaningful difference.  A difference of 2-3 points in the total score of the Quality of Life Index has been associated with significant improvement in overall quality of life, self-image, physical symptoms, and general health in studies assessing change in quality of life.  PHQ-9: Recent Review Flowsheet Data    Depression screen [facility] 2/9 06/30/2020 06/08/2020 05/10/2020 04/14/2020 03/17/2020   Decreased Interest 0 0 0 0 0   Down, Depressed, Hopeless 0 0 0 1 0   PHQ - 2 Score 0 0 0 1 0   Altered sleeping - 0 - - -   Tired, decreased energy - 3 - - -   Change in appetite - 0 - - -   Feeling bad or failure about yourself  - 1 - - -   Trouble concentrating - 0 - - -   Moving slowly or fidgety/restless - 0 - - -   Suicidal thoughts - 0 - - -   PHQ-9 Score - 4 - - -   Difficult doing work/chores - Somewhat difficult - - -     Interpretation of Total Score  Total Score Depression Severity:  1-4 = Minimal depression, 5-9 = Mild depression, 10-14 =  Moderate depression, 15-19 = Moderately severe depression, 20-27 = Severe depression   Psychosocial Evaluation and Intervention:  Psychosocial Evaluation - 06/08/20 1412      Psychosocial Evaluation & Interventions   Interventions Encouraged to exercise with the program and follow exercise prescription;Stress management education;Relaxation education    Comments Patient has no psychosocial barriers identified at his orientation visit. His initial QOL score was 23.47% and his PHQ-9 score was 4.    Expected Outcomes Patient will have no psychosocial issues identified at [place].    Continue Psychosocial Services  No Follow up required           Psychosocial Re-Evaluation:  Psychosocial Re-Evaluation    [place] Name 07/01/20 0800 07/27/20 1126           Psychosocial Re-Evaluation   Current issues with None Identified --      Comments Patient is new to the program but continues to have no psychosocial issues identified. He continues to deal with the loss of his daughter but does continue to demonstrate a positive outlook. Will continue to monitor. Patient continues to have no psychosocial issues identified completing 6 sessions. He has recently been hospitalized with CHF exacerbation. Was discharged 07/25/20. He has been doing well in the program demonstrating a positive attitude. Will continue to monitor.      Expected Outcomes Patient will have no psychosocial issues identified at discharge. Patient will have  no psychosocial issues identified at discharge.      Interventions Stress management education;Encouraged to attend Cardiac Rehabilitation for the exercise;Relaxation education Stress management education;Encouraged to attend Cardiac Rehabilitation for the exercise;Relaxation education      Continue Psychosocial Services  No Follow up required No Follow up required             Psychosocial Discharge (Final Psychosocial Re-Evaluation):  Psychosocial Re-Evaluation - 07/27/20 1126       Psychosocial Re-Evaluation   Comments Patient continues to have no psychosocial issues identified completing 6 sessions. He has recently been hospitalized with CHF exacerbation. Was discharged 07/25/20. He has been doing well in the program demonstrating a positive attitude. Will continue to monitor.    Expected Outcomes Patient will have no psychosocial issues identified at discharge.    Interventions Stress management education;Encouraged to attend Cardiac Rehabilitation for the exercise;Relaxation education    Continue Psychosocial Services  No Follow up required           Vocational Rehabilitation: Provide vocational rehab assistance to qualifying candidates.   Vocational Rehab Evaluation & Intervention:  Vocational Rehab - 06/08/20 1406      Initial Vocational Rehab Evaluation & Intervention   Assessment shows need for Vocational Rehabilitation No      Vocational Rehab Re-[place]   Comments Patient is a retired Company secretary. He is not interested in returning to work and does not need vocational rehab.           Education: Education Goals: Education classes will be provided on a weekly basis, covering required topics. Participant will state understanding/return demonstration of topics presented.  Learning Barriers/Preferences:  Learning Barriers/Preferences - 06/08/20 1405      Learning Barriers/Preferences   Learning Barriers None    Learning Preferences Written Material;Video;Skilled Demonstration;Verbal Instruction;Audio           Education Topics: Hypertension, Hypertension Reduction -Define heart disease and high blood pressure. Discus how high blood pressure affects the body and ways to reduce high blood pressure.   Exercise and Your Heart -Discuss why it is important to exercise, the FITT principles of exercise, normal and abnormal responses to exercise, and how to exercise safely.   Angina -Discuss definition of angina, causes of angina, treatment of  angina, and how to decrease risk of having angina.   Cardiac Medications -Review what the following cardiac medications are used for, how they affect the body, and side effects that may occur when taking the medications.  Medications include Aspirin, Beta blockers, calcium channel blockers, ACE Inhibitors, angiotensin receptor blockers, diuretics, digoxin, and antihyperlipidemics.   Congestive Heart Failure -Discuss the definition of CHF, how to live with CHF, the signs and symptoms of CHF, and how keep track of weight and sodium intake.   Heart Disease and Intimacy -Discus the effect sexual activity has on the heart, how changes occur during intimacy as we age, and safety during sexual activity.   Smoking Cessation / COPD -Discuss different methods to quit smoking, the health benefits of quitting smoking, and the definition of COPD.   Nutrition I: Fats -Discuss the types of cholesterol, what cholesterol does to the heart, and how cholesterol levels can be controlled.   Nutrition II: Labels -Discuss the different components of food labels and how to read food label   Heart Parts/Heart Disease and PAD -Discuss the anatomy of the heart, the pathway of blood circulation through the heart, and these are affected by heart disease.   Stress I: Signs and Symptoms -Discuss  the causes of stress, how stress may lead to anxiety and depression, and ways to limit stress.   Stress II: Relaxation -Discuss different types of relaxation techniques to limit stress.   Warning Signs of Stroke / TIA -Discuss definition of a stroke, what the signs and symptoms are of a stroke, and how to identify when someone is having stroke.   Knowledge Questionnaire Score:  Knowledge Questionnaire Score - 06/08/20 1406      Knowledge Questionnaire Score   Pre Score 18/24           Core Components/Risk Factors/Patient Goals at Admission:  Personal Goals and Risk Factors at Admission - 06/08/20 1406       Core Components/Risk Factors/Patient Goals on Admission    Weight Management Obesity;Yes    Intervention Weight Management: Develop a combined nutrition and exercise program designed to reach desired caloric intake, while maintaining appropriate intake of nutrient and fiber, sodium and fats, and appropriate energy expenditure required for the weight goal.;Weight Management: Provide education and appropriate resources to help participant work on and attain dietary goals.;Weight Management/Obesity: Establish reasonable short term and long term weight goals.;Obesity: Provide education and appropriate resources to help participant work on and attain dietary goals.    Admit Weight 303 lb (137.4 kg)    Goal Weight: Short Term 298 lb (135.2 kg)    Goal Weight: Long Term 293 lb (132.9 kg)    Personal Goal Other Yes    Personal Goal Get stronger; get back to doing his ADL's. Lose more weight: 10 lbs in the program and more long term.    Intervention Patient will attend CR 3 days/week and supplement with exercise at home 3 days/week.    Expected Outcomes Patient will meet both personal and program goals.           Core Components/Risk Factors/Patient Goals Review:   Goals and Risk Factor Review    Row Name 07/01/20 0802 07/27/20 1521           Core Components/Risk Factors/Patient Goals Review   Personal Goals Review Weight Management/Obesity Weight Management/Obesity      Review Patient is new the the program completing 3 sessions. He has multiple risk factors for CAD and is doing the program for risk modification. His personal goals are to get stronger; be able to do his ADL's and lose 10 lbs in the program and more long-term. We will cotinue to monitor as he works toward meeting both personal and program goals. Patient has completed 6 sessions gaining 10 lbs since his initial visit. His personal goals are to get stronger; be able to do his ADL's; lose 10 lbs in the praogram and more long-term.  His pcp referred him to cardiology with SOB. He saw Dr. Tenny Craw 07/20/20 and she admitted him to the hospital with volume overload for diuresis. He was discharged 07/25/20. He has a follow up visit with Dr. Tenny Craw 1/17. She does not want him to start back CR until his f/u visit. Patient has been notified. Will continue to monitor.      Expected Outcomes Patient will complete the program meeting both personal and program goals. Patient will complete the program meeting both personal and program goals.             Core Components/Risk Factors/Patient Goals at Discharge (Final Review):   Goals and Risk Factor Review - 07/27/20 1521      Core Components/Risk Factors/Patient Goals Review   Personal Goals Review Weight Management/Obesity  Review Patient has completed 6 sessions gaining 10 lbs since his initial visit. His personal goals are to get stronger; be able to do his ADL's; lose 10 lbs in the praogram and more long-term. His pcp referred him to cardiology with SOB. He saw Dr. Harrington Challenger 07/20/20 and she admitted him to the hospital with volume overload for diuresis. He was discharged 07/25/20. He has a follow up visit with Dr. Harrington Challenger 1/17. She does not want him to start back CR until his f/u visit. Patient has been notified. Will continue to monitor.    Expected Outcomes Patient will complete the program meeting both personal and program goals.           ITP Comments:   Comments: ITP REVIEW Pt is making expected progress toward Cardiac Rehab goals after completing 6 sessions. Patient waiting for clearance to return to CR from Dr. Harrington Challenger after being admitted with volume overload. Will continue to monitor.

## 2020-08-05 ENCOUNTER — Encounter: Payer: Self-pay | Admitting: Internal Medicine

## 2020-08-05 ENCOUNTER — Other Ambulatory Visit: Payer: Self-pay

## 2020-08-05 ENCOUNTER — Encounter (HOSPITAL_COMMUNITY): Payer: Medicare Other

## 2020-08-05 ENCOUNTER — Ambulatory Visit (INDEPENDENT_AMBULATORY_CARE_PROVIDER_SITE_OTHER): Payer: Medicare Other | Admitting: Internal Medicine

## 2020-08-05 VITALS — BP 107/71 | HR 79 | Ht 68.0 in | Wt 291.4 lb

## 2020-08-05 DIAGNOSIS — I2510 Atherosclerotic heart disease of native coronary artery without angina pectoris: Secondary | ICD-10-CM

## 2020-08-05 DIAGNOSIS — I5033 Acute on chronic diastolic (congestive) heart failure: Secondary | ICD-10-CM

## 2020-08-05 MED ORDER — NITROGLYCERIN 0.4 MG SL SUBL: 0.4000 mg | SUBLINGUAL_TABLET | SUBLINGUAL | 2 refills | Status: DC | PRN

## 2020-08-05 NOTE — Progress Notes (Signed)
Cardiology Office Note   Date:  08/05/2020   ID:  Ryan Garner, DOB Mar 11, 1946, MRN [id number]  PCP:  [name], [name], MD  Cardiologist:   Dorris Carnes, MD   Pt presents for follow up of CHF  History of Present Illness: Ryan Garner is a 75 y.o. male with a history of HTN, HL, pre DM, morbid obesity, atrial fibrillation and CAD    I  saw him in consult in Aug 2021.  He was admitted to [facility] on [facility] with CP and STEMI.  Underwent cath with PTCA/DES of LCx    Flecanide stopped  He was seen by Beckie Salts (EP  plan for rate control and anticoagulatoin  Not a candidate for Tikosyn.  Amiodarone alos not felt to be a good choice.    I saw the pt in Oct 2021 after discharge from the hosp    I saw the pt again at the end of Dec 2021   He complained of severe SOB  Hard time walking when I saw him   Wheezing on exam, marked volume overload   I recomm admission to [facility] for IV diuresis   parking lot   Notes mild chest pressure  Notes wheezing.     The pt was admitted and underwent signif IV diuresis    He says he is now waying himself every day   Told if wt goes up 3 lbs then he is to take 5 Zaroxylyn   He has not done this yet  He says he is breathing pretty good   ANkles are betterthan they have been  He denies CP   Current Meds  Medication Sig  . apixaban (ELIQUIS) 5 MG TABS tablet Take 1 tablet (5 mg total) by mouth 2 (two) times daily.  Marland Kitchen atorvastatin (LIPITOR) 80 MG tablet Take 1 tablet (80 mg total) by mouth daily.  . clopidogrel (PLAVIX) 75 MG tablet Take 1 tablet (75 mg total) by mouth daily with breakfast.  . fluticasone (FLONASE) 50 MCG/ACT nasal spray Place 2 sprays into both nostrils daily.  . furosemide (LASIX) 40 MG tablet Take 1 tablet (40 mg total) by mouth 2 (two) times daily.  . metolazone (ZAROXOLYN) 5 MG tablet Take 1 tablet (5 mg total) by mouth as needed (dypsnea or weight up 3 lbs).  . metoprolol tartrate (LOPRESSOR) 25 MG tablet Take 1.5 tablets (37.5 mg total) by  mouth 2 (two) times daily.  . Misc Natural Products (PROSTATE SUPPORT) 300-15 MG TABS Take 1 tablet by mouth daily. Super Beta Prostate  . Multiple Vitamin (MULTIVITAMIN WITH MINERALS) TABS tablet Take 1 tablet by mouth daily.  . nitroGLYCERIN (NITROSTAT) 0.4 MG SL tablet Place 1 tablet (0.4 mg total) under the tongue every 5 (five) minutes x 3 doses as needed for chest pain.  . potassium chloride SA (KLOR-CON) 20 MEQ tablet Take 1 tablet (20 mEq total) by mouth daily.     Allergies:   Flecainide and Rofecoxib   Past Medical History:  Diagnosis Date  . A-fib ([place])   . Acute on chronic diastolic CHF (congestive heart failure) ([place]) 07/21/2020  . Hyperlipidemia   . Hypertension   . Metabolic syndrome   . Prediabetes   . STEMI (ST elevation myocardial infarction) [facility])     Past Surgical History:  Procedure Laterality Date  . APPENDECTOMY    . CHOLECYSTECTOMY N/A 03/02/2020   Procedure: LAPAROSCOPIC CHOLECYSTECTOMY;  Surgeon: Aviva Signs, MD;  Location: [facility] ORS;  Service: General;  Laterality: N/A;  . CORONARY STENT INTERVENTION N/A 04/30/2020   Procedure: CORONARY STENT INTERVENTION;  Surgeon: Martinique, Peter M, MD;  Location: [employer] CV LAB;  Service: Cardiovascular;  Laterality: N/A;  . CORONARY/GRAFT ACUTE MI REVASCULARIZATION N/A 04/30/2020   Procedure: Coronary/Graft Acute MI Revascularization;  Surgeon: Martinique, Peter M, MD;  Location: [place] CV LAB;  Service: Cardiovascular;  Laterality: N/A;  . Eyelid Surgery Bilateral   . HERNIA REPAIR    . knee tendons repair    . LEFT HEART CATH AND CORONARY ANGIOGRAPHY N/A 04/30/2020   Procedure: LEFT HEART CATH AND CORONARY ANGIOGRAPHY;  Surgeon: Martinique, Peter M, MD;  Location: [employer] CV LAB;  Service: Cardiovascular;  Laterality: N/A;  . ROTATOR CUFF REPAIR Bilateral   . TONSILLECTOMY       Social History:  The patient  reports that he quit smoking about 61 years ago. His smoking use included cigarettes. He has never used  smokeless tobacco. He reports that he does not drink alcohol and does not use drugs.   Family History:  The patient's family history includes Arthritis in his father; Cancer in his mother; Coronary artery disease in his mother; Diabetes in his brother and maternal grandmother; Heart attack in his brother.    ROS:  Please see the history of present illness. All other systems are reviewed and  Negative to the above problem except as noted.    PHYSICAL EXAM: VS:  BP 107/71   Pulse 79   Ht 5\' 8"  (1.727 m)   Wt 291 lb 6.4 oz (132.2 kg)   SpO2 95%   BMI 44.31 kg/m   GEN: Morbidly obese 75 yo in no acute distress  HEENT: normal  Neck: JVP is not increased   Cardiac: Irreg irreg ; no murmurs   1+ LE edema (wearing support hose) Respiratory:  Clear to auscul GI: soft, obese  Nontender  MS: no deformity Moving all extremities   Skin: warm and dry, no rash Neuro:  Strength and sensation are intact Psych: euthymic mood, full affect   EKG:  EKG is not ordered today.    Cardiac Cath 10/9    Cath: 04/30/20   Ramus lesion is 75% stenosed.  Mid Cx lesion is 99% stenosed.  Post intervention, there is a 0% residual stenosis.  A drug-eluting stent was successfully placed using a STENT RESOLUTE ONYX 2.5X34.  The left ventricular systolic function is normal.  LV end diastolic pressure is mildly elevated.  The left ventricular ejection fraction is 55-65% by visual estimate.  1. 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 Good LV systolic function 3. Elevated LVEDP 4. Successful PCI of the mid to distal LCx with DES x 1.  Plan: ASA 81 mg daily for one month. Plavix 75 mg daily for one year. May resume Eliquis tomorrow. IV diuresis. Will need to stop Flecainide. Consider alternative AAD therapy.  ECHO   05/01/20  1. Anterior and anterolateral hypokinesis. Left ventricular ejection fraction, by estimation, is 60 to 65%. The left ventricle has normal function.  The left ventricle has no regional wall motion abnormalities. Left ventricular diastolic parameters are indeterminate. 2. Right ventricular systolic function is normal. The right ventricular size is normal. There is severely elevated pulmonary artery systolic pressure. 3. Left atrial size was severely dilated. 4. The mitral valve is normal in structure. Mild to moderate mitral valve regurgitation. No evidence of mitral stenosis. 5. The aortic valve is calcified. There is moderate calcification of  the aortic valve. There is moderate thickening of the aortic valve. Aortic valve regurgitation is mild. No aortic stenosis is present. Aortic valve area, by VTI measures 1.04 cm. Aortic valve mean gradient measures 15.2 mmHg. Aortic valve Vmax measures 2.70 m/s. 6. Aortic dilatation noted. There is mild dilatation of the ascending aorta, measuring 38 mm. 7. The inferior vena cava is dilated in size with >50% respiratory variability, suggesting right atrial pressure of 8 mmHg.  Lipid Panel    Component Value Date/Time   CHOL 122 05/01/2020 1759   CHOL 166 09/04/2018 1156   TRIG 63 05/01/2020 1759   TRIG 74 08/27/2014 0826   HDL 30 (L) 05/01/2020 1759   HDL 42 09/04/2018 1156   HDL 56 08/27/2014 0826   CHOLHDL 4.1 05/01/2020 1759   VLDL 13 05/01/2020 1759   LDLCALC 79 05/01/2020 1759   LDLCALC 108 (H) 09/04/2018 1156   LDLCALC 78 11/13/2013 0911      Wt Readings from Last 3 Encounters:  08/05/20 291 lb 6.4 oz (132.2 kg)  07/25/20 287 lb 1.6 oz (130.2 kg)  07/20/20 (!) 315 lb (142.9 kg)      ASSESSMENT AND PLAN: 1  Chronic diastolic CHF   PT's volume improved   Size makes it difficult to fully assess but he says he is breathing much better , ankles improved.  I would keep on current regimen   I reinforced need to watch salt, weigh daily     Will get BMET and BNP  2  CAD   Pt with intervention to LCx in October   No symptoms of agnina   2  Atrial fibrillation   Pt is rate  control in afib  On Eliquis    3  HL  Keep on atorvastatin LDL in Oct 79     F/U in May 2021   Current medicines are reviewed at length with the patient today.  The patient does not have concerns regarding medicines.  Signed, Dorris Carnes, MD  08/05/2020 3:10 PM    [facility] [place], [place], [place]  [address] Phone: [phone]; Fax: [phone]

## 2020-08-05 NOTE — Patient Instructions (Signed)
Medication Instructions:  No changes *If you need a refill on your cardiac medications before your next appointment, please call your pharmacy*   Lab Work: BMET, BNP If you have labs (blood work) drawn today and your tests are completely normal, you will receive your results only by: Marland Kitchen MyChart Message (if you have MyChart) OR . A paper copy in the mail If you have any lab test that is abnormal or we need to change your treatment, we will call you to review the results.   Testing/Procedures: none   Follow-Up: At [facility], you and your health needs are our priority.  As part of our continuing mission to provide you with exceptional heart care, we have created designated Provider Care Teams.  These Care Teams include your primary Cardiologist (physician) and Advanced Practice Providers (APPs -  Physician Assistants and Nurse Practitioners) who all work together to provide you with the care you need, when you need it.  We recommend signing up for the patient portal called "MyChart".  Sign up information is provided on this After Visit Summary.  MyChart is used to connect with patients for Virtual Visits (Telemedicine).  Patients are able to view lab/test results, encounter notes, upcoming appointments, etc.  Non-urgent messages can be sent to your provider as well.   To learn more about what you can do with MyChart, go to [email or web address].    Your next appointment:   4 month(s)  The format for your next appointment:   In Person  Provider:   You may see Dorris Carnes, MD or one of the following Advanced Practice Providers on your designated Care Team:    Bernerd Pho, PA-C   Ermalinda Barrios, [place]    Other Instructions

## 2020-08-06 LAB — PRO B NATRIURETIC PEPTIDE: NT-Pro BNP: 3671 pg/mL — ABNORMAL HIGH (ref 0–486)

## 2020-08-06 LAB — BASIC METABOLIC PANEL
BUN/Creatinine Ratio: 23 (ref 10–24)
BUN: 27 mg/dL (ref 8–27)
CO2: 25 mmol/L (ref 20–29)
Calcium: 9.2 mg/dL (ref 8.6–10.2)
Chloride: 99 mmol/L (ref 96–106)
Creatinine, Ser: 1.16 mg/dL (ref 0.76–1.27)
GFR calc Af Amer: 70 mL/min/{1.73_m2} (ref >59)
GFR calc non Af Amer: 61 mL/min/{1.73_m2} (ref >59)
Glucose: 122 mg/dL — ABNORMAL HIGH (ref 65–99)
Potassium: 4.2 mmol/L (ref 3.5–5.2)
Sodium: 139 mmol/L (ref 134–144)

## 2020-08-08 ENCOUNTER — Telehealth: Payer: Self-pay | Admitting: Internal Medicine

## 2020-08-08 ENCOUNTER — Encounter (HOSPITAL_COMMUNITY): Payer: Medicare Other

## 2020-08-08 ENCOUNTER — Ambulatory Visit: Payer: Medicare Other | Admitting: Internal Medicine

## 2020-08-08 NOTE — Telephone Encounter (Signed)
Left message to call back  

## 2020-08-08 NOTE — Telephone Encounter (Signed)
Follow Up:    Pt is returning your call, concerning his results. 

## 2020-08-09 NOTE — Telephone Encounter (Signed)
Patient returning a call to go over results. Please call back

## 2020-08-10 ENCOUNTER — Encounter (HOSPITAL_COMMUNITY): Payer: Medicare Other

## 2020-08-10 NOTE — Telephone Encounter (Signed)
Patient has been informed of lab results and to take half tablet of metolazone 30 min prior to lasix.  He will do this tomorrow.  Will continue daily weights, decrease sodium.  He wants to get labs at [facility].  I am mailing lab slip for BMET an pro BNP to his home.

## 2020-08-10 NOTE — Telephone Encounter (Signed)
Follow Up:     Pt returning a call , concerning his  results.

## 2020-08-12 ENCOUNTER — Encounter (HOSPITAL_COMMUNITY): Payer: Medicare Other

## 2020-08-15 ENCOUNTER — Encounter (HOSPITAL_COMMUNITY): Payer: Medicare Other

## 2020-08-17 ENCOUNTER — Encounter (HOSPITAL_COMMUNITY): Payer: Medicare Other

## 2020-08-19 ENCOUNTER — Encounter (HOSPITAL_COMMUNITY): Payer: Medicare Other

## 2020-08-19 DIAGNOSIS — D229 Melanocytic nevi, unspecified: Secondary | ICD-10-CM | POA: Diagnosis not present

## 2020-08-19 DIAGNOSIS — L821 Other seborrheic keratosis: Secondary | ICD-10-CM | POA: Diagnosis not present

## 2020-08-19 DIAGNOSIS — L814 Other melanin hyperpigmentation: Secondary | ICD-10-CM | POA: Diagnosis not present

## 2020-08-19 DIAGNOSIS — L570 Actinic keratosis: Secondary | ICD-10-CM | POA: Diagnosis not present

## 2020-08-19 DIAGNOSIS — L280 Lichen simplex chronicus: Secondary | ICD-10-CM | POA: Diagnosis not present

## 2020-08-22 ENCOUNTER — Encounter (HOSPITAL_COMMUNITY)
Admission: RE | Admit: 2020-08-22 | Discharge: 2020-08-22 | Disposition: A | Discharge status: Home / Self Care | Payer: Medicare Other | Source: Ambulatory Visit | Attending: Cardiology | Admitting: Cardiology

## 2020-08-22 ENCOUNTER — Other Ambulatory Visit: Payer: Self-pay

## 2020-08-22 DIAGNOSIS — Z955 Presence of coronary angioplasty implant and graft: Secondary | ICD-10-CM | POA: Insufficient documentation

## 2020-08-22 DIAGNOSIS — I2121 ST elevation (STEMI) myocardial infarction involving left circumflex coronary artery: Secondary | ICD-10-CM | POA: Diagnosis not present

## 2020-08-22 NOTE — Progress Notes (Signed)
Daily Session Note  Patient Details  Name: Ryan Garner MRN: [id number] Date of Birth: July 22, 1946 Referring Provider:   Flowsheet Row CARDIAC REHAB PHASE II ORIENTATION from 06/08/2020 in [facility]  Referring Provider Dr. Harrington Challenger      Encounter Date: 08/22/2020  Check In:  Session Check In - 08/22/20 [place]      Check-In   Supervising physician immediately available to respond to emergencies [facility] MD immediately available    Physician(s) Domenic Polite    Location [facility]    Staff Present Geanie Cooley, RN;Debra Wynetta Emery, RN, BSN;Madison Audria Nine, MS, Exercise Physiologist;Dalton Kris Mouton, MS, ACSM-CEP, Exercise Physiologist    Virtual Visit No    Medication changes reported     No    Fall or balance concerns reported    No    Tobacco Cessation No Change    Warm-up and Cool-down Performed as group-led instruction    Resistance Training Performed Yes    VAD Patient? No    PAD/SET Patient? No      Pain Assessment   Currently in Pain? No/denies    Pain Score 0-No pain    Multiple Pain Sites No           Capillary Blood Glucose: No results found for this or any previous visit (from the past 24 hour(s)).    Social History   Tobacco Use  Smoking Status Former Smoker  . Types: Cigarettes  . Quit date: 04/02/1959  . Years since quitting: 61.4  Smokeless Tobacco Never Used    Goals Met:  Independence with exercise equipment Exercise tolerated well No report of cardiac concerns or symptoms Strength training completed today  Goals Unmet:  Not Applicable  Comments: check out @ 3:45pm   Dr. Kathie Dike is Medical Director for [facility] Pulmonary Rehab.

## 2020-08-24 ENCOUNTER — Encounter (HOSPITAL_COMMUNITY)
Admission: RE | Admit: 2020-08-24 | Discharge: 2020-08-24 | Disposition: A | Discharge status: Home / Self Care | Payer: Medicare Other | Source: Ambulatory Visit | Attending: Cardiology | Admitting: Cardiology

## 2020-08-24 ENCOUNTER — Other Ambulatory Visit: Payer: Self-pay

## 2020-08-24 DIAGNOSIS — Z955 Presence of coronary angioplasty implant and graft: Secondary | ICD-10-CM | POA: Insufficient documentation

## 2020-08-24 DIAGNOSIS — I2121 ST elevation (STEMI) myocardial infarction involving left circumflex coronary artery: Secondary | ICD-10-CM | POA: Diagnosis not present

## 2020-08-24 NOTE — Progress Notes (Signed)
Daily Session Note  Patient Details  Name: Ryan Garner MRN: [id number] Date of Birth: 05/17/1946 Referring Provider:   Flowsheet Row CARDIAC REHAB PHASE II ORIENTATION from 06/08/2020 in [facility]  Referring Provider Dr. Harrington Challenger      Encounter Date: 08/24/2020  Check In:  Session Check In - 08/24/20 1445      Check-In   Supervising physician immediately available to respond to emergencies [facility] MD immediately available    Physician(s) Dr. Domenic Polite    Location [facility]    Staff Present Cathren Harsh, MS, Exercise Physiologist;Debra Wynetta Emery, RN, BSN    Virtual Visit No    Medication changes reported     No    Fall or balance concerns reported    No    Tobacco Cessation No Change    Warm-up and Cool-down Performed as group-led instruction    Resistance Training Performed Yes    VAD Patient? No    PAD/SET Patient? No      Pain Assessment   Currently in Pain? No/denies    Pain Score 0-No pain    Multiple Pain Sites No           Capillary Blood Glucose: No results found for this or any previous visit (from the past 24 hour(s)).    Social History   Tobacco Use  Smoking Status Former Smoker  . Types: Cigarettes  . Quit date: 04/02/1959  . Years since quitting: 61.4  Smokeless Tobacco Never Used    Goals Met:  Independence with exercise equipment Exercise tolerated well No report of cardiac concerns or symptoms Strength training completed today  Goals Unmet:  Not Applicable  Comments: check out 1545   Dr. Kathie Dike is Medical Director for [facility] Pulmonary Rehab.

## 2020-08-25 ENCOUNTER — Ambulatory Visit: Payer: Medicare Other | Admitting: Family Medicine

## 2020-08-26 ENCOUNTER — Encounter (HOSPITAL_COMMUNITY): Payer: Medicare Other

## 2020-08-29 ENCOUNTER — Other Ambulatory Visit: Payer: Self-pay

## 2020-08-29 ENCOUNTER — Encounter (HOSPITAL_COMMUNITY)
Admission: RE | Admit: 2020-08-29 | Discharge: 2020-08-29 | Disposition: A | Discharge status: Home / Self Care | Payer: Medicare Other | Source: Ambulatory Visit | Attending: Cardiology | Admitting: Cardiology

## 2020-08-29 VITALS — Wt 291.4 lb

## 2020-08-29 DIAGNOSIS — Z955 Presence of coronary angioplasty implant and graft: Secondary | ICD-10-CM

## 2020-08-29 DIAGNOSIS — I2121 ST elevation (STEMI) myocardial infarction involving left circumflex coronary artery: Secondary | ICD-10-CM | POA: Diagnosis not present

## 2020-08-29 NOTE — Progress Notes (Signed)
Daily Session Note  Patient Details  Name: Ryan Garner MRN: [id number] Date of Birth: 1945/08/14 Referring Provider:   Flowsheet Row CARDIAC REHAB PHASE II ORIENTATION from 06/08/2020 in [facility] [place]  Referring Provider Dr. Harrington Challenger      Encounter Date: 08/29/2020  Check In:  Session Check In - 08/29/20 1457      Check-In   Supervising physician immediately available to respond to emergencies [facility] MD immediately available    Physician(s) Dr. Harl Bowie    Location [facility]    Staff Present Cathren Harsh, MS, Exercise Physiologist;Debra Wynetta Emery, RN, BSN    Virtual Visit No    Medication changes reported     No    Fall or balance concerns reported    No    Tobacco Cessation No Change    Warm-up and Cool-down Performed as group-led instruction    Resistance Training Performed Yes    VAD Patient? No    PAD/SET Patient? No      Pain Assessment   Currently in Pain? No/denies    Pain Score 0-No pain    Multiple Pain Sites No           Capillary Blood Glucose: No results found for this or any previous visit (from the past 24 hour(s)).    Social History   Tobacco Use  Smoking Status Former Smoker  . Types: Cigarettes  . Quit date: 04/02/1959  . Years since quitting: 61.4  Smokeless Tobacco Never Used    Goals Met:  Independence with exercise equipment Exercise tolerated well No report of cardiac concerns or symptoms Strength training completed today  Goals Unmet:  Not Applicable  Comments: check out 1545   Dr. Kathie Dike is Medical Director for [facility] Pulmonary Rehab.

## 2020-08-31 ENCOUNTER — Encounter (HOSPITAL_COMMUNITY): Payer: Medicare Other

## 2020-08-31 NOTE — Progress Notes (Signed)
Cardiac Individual Treatment Plan  Patient Details  Name: Ryan Garner MRN: [id number] Date of Birth: 1945-10-08 Referring Provider:   Flowsheet Row CARDIAC REHAB PHASE II ORIENTATION from 06/08/2020 in [facility]  Referring Provider Dr. Harrington Challenger      Initial Encounter Date:  Flowsheet Row CARDIAC REHAB PHASE II ORIENTATION from 06/08/2020 in [facility]  Date 06/08/20      Visit Diagnosis: S/P coronary artery stent placement  ST elevation myocardial infarction involving left circumflex coronary artery ([place])  Patient's Home Medications on Admission:  Current Outpatient Medications:  .  apixaban (ELIQUIS) 5 MG TABS tablet, Take 1 tablet (5 mg total) by mouth 2 (two) times daily., Disp: 28 tablet, Rfl: 0 .  atorvastatin (LIPITOR) 80 MG tablet, Take 1 tablet (80 mg total) by mouth daily., Disp: 90 tablet, Rfl: 1 .  clopidogrel (PLAVIX) 75 MG tablet, Take 1 tablet (75 mg total) by mouth daily with breakfast., Disp: 90 tablet, Rfl: 2 .  fluticasone (FLONASE) 50 MCG/ACT nasal spray, Place 2 sprays into both nostrils daily., Disp: 16 g, Rfl: 6 .  furosemide (LASIX) 40 MG tablet, Take 1 tablet (40 mg total) by mouth 2 (two) times daily., Disp: 60 tablet, Rfl: 1 .  metolazone (ZAROXOLYN) 5 MG tablet, Take 1 tablet (5 mg total) by mouth as needed (dypsnea or weight up 3 lbs)., Disp: 30 tablet, Rfl: 1 .  metoprolol tartrate (LOPRESSOR) 25 MG tablet, Take 1.5 tablets (37.5 mg total) by mouth 2 (two) times daily., Disp: 90 tablet, Rfl: 6 .  Misc Natural Products (PROSTATE SUPPORT) 300-15 MG TABS, Take 1 tablet by mouth daily. Super Beta Prostate, Disp: , Rfl:  .  Multiple Vitamin (MULTIVITAMIN WITH MINERALS) TABS tablet, Take 1 tablet by mouth daily., Disp: , Rfl:  .  nitroGLYCERIN (NITROSTAT) 0.4 MG SL tablet, Place 1 tablet (0.4 mg total) under the tongue every 5 (five) minutes x 3 doses as needed for chest pain., Disp: 25 tablet, Rfl: 2 .  potassium  chloride SA (KLOR-CON) 20 MEQ tablet, Take 1 tablet (20 mEq total) by mouth daily., Disp: 30 tablet, Rfl: 1  Past Medical History: Past Medical History:  Diagnosis Date  . A-fib ([place])   . Acute on chronic diastolic CHF (congestive heart failure) ([place]) 07/21/2020  . Hyperlipidemia   . Hypertension   . Metabolic syndrome   . Prediabetes   . STEMI (ST elevation myocardial infarction) ([place])     Tobacco Use: Social History   Tobacco Use  Smoking Status Former Smoker  . Types: Cigarettes  . Quit date: 04/02/1959  . Years since quitting: 61.4  Smokeless Tobacco Never Used    Labs: Recent Chemical engineer    Labs for ITP Cardiac and Pulmonary Rehab Latest Ref Rng & Units 09/04/2018 03/01/2020 04/30/2020 05/01/2020 07/21/2020   Cholestrol 0 - 200 mg/dL 166 - 123 122 -   LDLCALC 0 - 99 mg/dL 108(H) - 78 79 -   HDL >40 mg/dL 42 - 27(L) 30(L) -   Trlycerides <150 mg/dL 82 - 88 63 -   Hemoglobin A1c 4.8 - 5.6 % 5.8 6.0(H) 6.2(H) - 6.5(H)   TCO2 22 - 32 mmol/L - - 21(L) - -      Capillary Blood Glucose: Lab Results  Component Value Date   GLUCAP 119 (H) 07/24/2020   GLUCAP 136 (H) 07/22/2020   GLUCAP 153 (H) 07/22/2020   GLUCAP 86 07/22/2020   GLUCAP 145 (H) 07/21/2020  Exercise Target Goals: Exercise Program Goal: Individual exercise prescription set using results from initial 6 min walk test and THRR while considering  patient's activity barriers and safety.   Exercise Prescription Goal: Starting with aerobic activity 30 plus minutes a day, 3 days per week for initial exercise prescription. Provide home exercise prescription and guidelines that participant acknowledges understanding prior to discharge.  Activity Barriers & Risk Stratification:  Activity Barriers & Cardiac Risk Stratification - 07/05/20 1215      Activity Barriers & Cardiac Risk Stratification   Activity Barriers Arthritis;Back Problems;Deconditioning;Joint Problems;Balance Concerns;History of Falls     Cardiac Risk Stratification High           6 Minute Walk:  6 Minute Walk    Row Name 06/08/20 1408         6 Minute Walk   Phase Initial     Distance 700 feet     Walk Time 6 minutes     # of Rest Breaks 2     MPH 1.33     METS 0.31     RPE 15     Perceived Dyspnea  15     VO2 Peak 1.07     Symptoms Yes (comment)     Comments In afib during the test. Took 2 standing rest breaks for 45 and 30 seconds due to SOB     Resting HR 74 bpm     Resting BP 100/70     Resting Oxygen Saturation  97 %     Exercise Oxygen Saturation  during 6 min walk 97 %     Max Ex. HR 99 bpm     Max Ex. BP 112/64     2 Minute Post BP 102/62            Oxygen Initial Assessment:   Oxygen Re-Evaluation:   Oxygen Discharge (Final Oxygen Re-Evaluation):   Initial Exercise Prescription:  Initial Exercise Prescription - 06/08/20 1400      Date of Initial Exercise RX and Referring Provider   Date 06/08/20    Referring Provider Dr. Harrington Challenger    Expected Discharge Date 09/16/20      NuStep   Level 1    SPM 60    Minutes 22      Arm Ergometer   Level 1    RPM 60    Minutes 17      Prescription Details   Frequency (times per week) 3    Duration Progress to 30 minutes of continuous aerobic without signs/symptoms of physical distress      Intensity   THRR 40-80% of Max Heartrate 58-115    Ratings of Perceived Exertion 11-13    Perceived Dyspnea 0-4      Progression   Progression Continue progressive overload as per policy without signs/symptoms or physical distress.      Resistance Training   Training Prescription Yes    Weight 3 lbs    Reps 10-15           Perform Capillary Blood Glucose checks as needed.  Exercise Prescription Changes:   Exercise Prescription Changes    Row Name 07/01/20 0741 07/18/20 1445 08/30/20 0800         Response to Exercise   Blood Pressure (Admit) 128/70 128/80 140/86     Blood Pressure (Exercise) 118/70 134/88 178/68     Blood Pressure  (Exit) 112/76 118/86 104/64     Heart Rate (Admit) 91 bpm 72 bpm 100 bpm  Heart Rate (Exercise) 110 bpm 100 bpm 110 bpm     Heart Rate (Exit) 96 bpm 77 bpm 102 bpm     Rating of Perceived Exertion (Exercise) 12 12 11      Duration Continue with 30 min of aerobic exercise without signs/symptoms of physical distress. Continue with 30 min of aerobic exercise without signs/symptoms of physical distress. Continue with 30 min of aerobic exercise without signs/symptoms of physical distress.     Intensity THRR unchanged THRR unchanged THRR unchanged           Progression   Progression Continue to progress workloads to maintain intensity without signs/symptoms of physical distress. Continue to progress workloads to maintain intensity without signs/symptoms of physical distress. Continue to progress workloads to maintain intensity without signs/symptoms of physical distress.           Resistance Training   Training Prescription Yes Yes Yes     Weight 3 lbs 3 lbs 3 lbs     Reps 10-15 10-15 10-15     Time -- 10 Minutes 10 Minutes           NuStep   Level 1 1 1      SPM 83 84 97     Minutes 22 22 22      METs 2.1 2 2.1           Arm Ergometer   Level 1 1 1      RPM 48 47 55     Minutes 17 17 17      METs 1.4 1.4 1.5            Exercise Comments:   Exercise Comments    Row Name 06/27/20 1542           Exercise Comments Pt completed first exercise session. He tolerated exercise well and is excited to come back.              Exercise Goals and Review:   Exercise Goals    Row Name 06/08/20 1413 07/01/20 0743 08/01/20 1407         Exercise Goals   Increase Physical Activity Yes Yes Yes     Intervention Provide advice, education, support and counseling about physical activity/exercise needs.;Develop an individualized exercise prescription for aerobic and resistive training based on initial evaluation findings, risk stratification, comorbidities and participant's personal goals.  Provide advice, education, support and counseling about physical activity/exercise needs.;Develop an individualized exercise prescription for aerobic and resistive training based on initial evaluation findings, risk stratification, comorbidities and participant's personal goals. Provide advice, education, support and counseling about physical activity/exercise needs.;Develop an individualized exercise prescription for aerobic and resistive training based on initial evaluation findings, risk stratification, comorbidities and participant's personal goals.     Expected Outcomes Short Term: Attend rehab on a regular basis to increase amount of physical activity.;Long Term: Add in home exercise to make exercise part of routine and to increase amount of physical activity.;Long Term: Exercising regularly at least 3-5 days a week. Short Term: Attend rehab on a regular basis to increase amount of physical activity.;Long Term: Add in home exercise to make exercise part of routine and to increase amount of physical activity.;Long Term: Exercising regularly at least 3-5 days a week. Short Term: Attend rehab on a regular basis to increase amount of physical activity.;Long Term: Add in home exercise to make exercise part of routine and to increase amount of physical activity.;Long Term: Exercising regularly at least 3-5 days a week.  Increase Strength and Stamina Yes Yes Yes     Intervention Provide advice, education, support and counseling about physical activity/exercise needs.;Develop an individualized exercise prescription for aerobic and resistive training based on initial evaluation findings, risk stratification, comorbidities and participant's personal goals. Provide advice, education, support and counseling about physical activity/exercise needs.;Develop an individualized exercise prescription for aerobic and resistive training based on initial evaluation findings, risk stratification, comorbidities and  participant's personal goals. Provide advice, education, support and counseling about physical activity/exercise needs.;Develop an individualized exercise prescription for aerobic and resistive training based on initial evaluation findings, risk stratification, comorbidities and participant's personal goals.     Expected Outcomes Short Term: Increase workloads from initial exercise prescription for resistance, speed, and METs.;Short Term: Perform resistance training exercises routinely during rehab and add in resistance training at home;Long Term: Improve cardiorespiratory fitness, muscular endurance and strength as measured by increased METs and functional capacity (6MWT) Short Term: Increase workloads from initial exercise prescription for resistance, speed, and METs.;Short Term: Perform resistance training exercises routinely during rehab and add in resistance training at home;Long Term: Improve cardiorespiratory fitness, muscular endurance and strength as measured by increased METs and functional capacity (6MWT) Short Term: Increase workloads from initial exercise prescription for resistance, speed, and METs.;Short Term: Perform resistance training exercises routinely during rehab and add in resistance training at home;Long Term: Improve cardiorespiratory fitness, muscular endurance and strength as measured by increased METs and functional capacity (6MWT)     Able to understand and use rate of perceived exertion (RPE) scale Yes Yes Yes     Intervention Provide education and explanation on how to use RPE scale Provide education and explanation on how to use RPE scale Provide education and explanation on how to use RPE scale     Expected Outcomes Short Term: Able to use RPE daily in rehab to express subjective intensity level;Long Term:  Able to use RPE to guide intensity level when exercising independently Short Term: Able to use RPE daily in rehab to express subjective intensity level;Long Term:  Able to use  RPE to guide intensity level when exercising independently Short Term: Able to use RPE daily in rehab to express subjective intensity level;Long Term:  Able to use RPE to guide intensity level when exercising independently     Knowledge and understanding of Target Heart Rate Range (THRR) Yes Yes Yes     Intervention Provide education and explanation of THRR including how the numbers were predicted and where they are located for reference Provide education and explanation of THRR including how the numbers were predicted and where they are located for reference Provide education and explanation of THRR including how the numbers were predicted and where they are located for reference     Expected Outcomes Short Term: Able to state/look up THRR;Long Term: Able to use THRR to govern intensity when exercising independently;Short Term: Able to use daily as guideline for intensity in rehab Short Term: Able to state/look up THRR;Long Term: Able to use THRR to govern intensity when exercising independently;Short Term: Able to use daily as guideline for intensity in rehab Short Term: Able to state/look up THRR;Long Term: Able to use THRR to govern intensity when exercising independently;Short Term: Able to use daily as guideline for intensity in rehab     Able to check pulse independently Yes Yes Yes     Intervention Provide education and demonstration on how to check pulse in carotid and radial arteries.;Review the importance of being able to check your own pulse  for safety during independent exercise Review the importance of being able to check your own pulse for safety during independent exercise;Provide education and demonstration on how to check pulse in carotid and radial arteries. Review the importance of being able to check your own pulse for safety during independent exercise;Provide education and demonstration on how to check pulse in carotid and radial arteries.     Expected Outcomes Long Term: Able to check  pulse independently and accurately;Short Term: Able to explain why pulse checking is important during independent exercise Short Term: Able to explain why pulse checking is important during independent exercise;Long Term: Able to check pulse independently and accurately Short Term: Able to explain why pulse checking is important during independent exercise;Long Term: Able to check pulse independently and accurately     Understanding of Exercise Prescription Yes Yes Yes     Intervention Provide education, explanation, and written materials on patient's individual exercise prescription Provide education, explanation, and written materials on patient's individual exercise prescription Provide education, explanation, and written materials on patient's individual exercise prescription     Expected Outcomes Short Term: Able to explain program exercise prescription;Long Term: Able to explain home exercise prescription to exercise independently Short Term: Able to explain program exercise prescription;Long Term: Able to explain home exercise prescription to exercise independently Short Term: Able to explain program exercise prescription;Long Term: Able to explain home exercise prescription to exercise independently            Exercise Goals Re-Evaluation :  Exercise Goals Re-Evaluation    Row Name 07/01/20 0744 08/01/20 1407 08/30/20 0808         Exercise Goal Re-Evaluation   Exercise Goals Review Increase Physical Activity;Increase Strength and Stamina;Able to understand and use rate of perceived exertion (RPE) scale;Knowledge and understanding of Target Heart Rate Range (THRR);Able to check pulse independently;Understanding of Exercise Prescription Increase Physical Activity;Increase Strength and Stamina;Able to understand and use rate of perceived exertion (RPE) scale;Knowledge and understanding of Target Heart Rate Range (THRR);Able to check pulse independently;Understanding of Exercise Prescription  Increase Physical Activity;Increase Strength and Stamina;Able to understand and use rate of perceived exertion (RPE) scale;Knowledge and understanding of Target Heart Rate Range (THRR);Able to check pulse independently;Understanding of Exercise Prescription     Comments Patient has completed 1 exerise session. He has tolerated exercise well. He is eager to come to rehab and has a positive attitude. He is currently exercising at 2.1 METs on the NuStep. Will continue to monitor and progress exercise as able. Pt has completed 5 exercise sessions. He has been frequently absent, so he has not progressed. He was recently hospitialized due to being fluid overloaded. While in the hospitial they were able to get over 20 lbs of fluid out of him. He has a follow up appointment with his cardiologist 08/08/2020 to be cleared to return to exercise. He eats out a lot and cosumes a lot of salt in his diet which greatly contributes to his fluid retention. He was exercising at 2.0 METs on the stepper before he was hospitialized. Will continue to monitor and progress as able. Patient has completed 8 exercise sessions. He was out for about a month due to fluid build up, so he is starting to get back into it. He has been back for about a week and is progressing. He is currently exercising at 2.1 METs on the NuStep.     Expected Outcomes Through exercise at rehab and with a home exercise plan, patient will achieve their goals. Through  exercise at rehab and with a home exercise plan, patient will achieve their goals. Through exercise at rehab and a home exercise plan, patient will reach their goals.             Discharge Exercise Prescription (Final Exercise Prescription Changes):  Exercise Prescription Changes - 08/30/20 0800      Response to Exercise   Blood Pressure (Admit) 140/86    Blood Pressure (Exercise) 178/68    Blood Pressure (Exit) 104/64    Heart Rate (Admit) 100 bpm    Heart Rate (Exercise) 110 bpm    Heart  Rate (Exit) 102 bpm    Rating of Perceived Exertion (Exercise) 11    Duration Continue with 30 min of aerobic exercise without signs/symptoms of physical distress.    Intensity THRR unchanged      Progression   Progression Continue to progress workloads to maintain intensity without signs/symptoms of physical distress.      Resistance Training   Training Prescription Yes    Weight 3 lbs    Reps 10-15    Time 10 Minutes      NuStep   Level 1    SPM 97    Minutes 22    METs 2.1      Arm Ergometer   Level 1    RPM 55    Minutes 17    METs 1.5           Nutrition:  Target Goals: Understanding of nutrition guidelines, daily intake of sodium 1500mg , cholesterol 200mg , calories 30% from fat and 7% or less from saturated fats, daily to have 5 or more servings of fruits and vegetables.  Biometrics:  Pre Biometrics - 08/30/20 0812      Pre Biometrics   Weight 132.2 kg            Nutrition Therapy Plan and Nutrition Goals:  Nutrition Therapy & Goals - 08/24/20 0739      Personal Nutrition Goals   Comments We will continue to provide heart healthy nutritional education through hand-outs.      Intervention Plan   Intervention Nutrition handout(s) given to patient.           Nutrition Assessments:  Nutrition Assessments - 06/08/20 1405      MEDFICTS Scores   Pre Score 41          MEDIFICTS Score Key:  ?70 Need to make dietary changes   40-70 Heart Healthy Diet  ? 40 Therapeutic Level Cholesterol Diet   Picture Your Plate Scores:  <70 Unhealthy dietary pattern with much room for improvement.  41-50 Dietary pattern unlikely to meet recommendations for good health and room for improvement.  51-60 More healthful dietary pattern, with some room for improvement.   >60 Healthy dietary pattern, although there may be some specific behaviors that could be improved.    Nutrition Goals Re-Evaluation:   Nutrition Goals Discharge (Final Nutrition Goals  Re-Evaluation):   Psychosocial: Target Goals: Acknowledge presence or absence of significant depression and/or stress, maximize coping skills, provide positive support system. Participant is able to verbalize types and ability to use techniques and skills needed for reducing stress and depression.  Initial Review & Psychosocial Screening:  Initial Psych Review & Screening - 06/08/20 1409      Initial Review   Current issues with None Identified      Family Dynamics   Good Support System? Yes    Concerns Recent loss of child    Comments Patient  lives with his wife of 67 years. He lost his daughter to [place] in September 2021. He says he and his wife are dealing with their grief through family and friends and their church family. He says his faith in God is getting him through and that he knows he will see his daughter again. He also has a son who lives in [place] and has several grand-children. He denies any depression or anxiety but does acknowlege his grief. Will continue to monitor.      Barriers   Psychosocial barriers to participate in program Psychosocial barriers identified (see note)           Quality of Life Scores:  Quality of Life - 06/08/20 1407      Quality of Life   Select Quality of Life      Quality of Life Scores   Health/Function Pre 19.03 %    Socioeconomic Pre 27.36 %    Psych/Spiritual Pre 27.86 %    Family Pre 25.2 %    GLOBAL Pre 23.47 %          Scores of 19 and below usually indicate a poorer quality of life in these areas.  A difference of  2-3 points is a clinically meaningful difference.  A difference of 2-3 points in the total score of the Quality of Life Index has been associated with significant improvement in overall quality of life, self-image, physical symptoms, and general health in studies assessing change in quality of life.  PHQ-9: Recent Review Flowsheet Data    Depression screen [facility] 2/9 06/30/2020 06/08/2020 05/10/2020 04/14/2020  03/17/2020   Decreased Interest 0 0 0 0 0   Down, Depressed, Hopeless 0 0 0 1 0   PHQ - 2 Score 0 0 0 1 0   Altered sleeping - 0 - - -   Tired, decreased energy - 3 - - -   Change in appetite - 0 - - -   Feeling bad or failure about yourself  - 1 - - -   Trouble concentrating - 0 - - -   Moving slowly or fidgety/restless - 0 - - -   Suicidal thoughts - 0 - - -   PHQ-9 Score - 4 - - -   Difficult doing work/chores - Somewhat difficult - - -     Interpretation of Total Score  Total Score Depression Severity:  1-4 = Minimal depression, 5-9 = Mild depression, 10-14 = Moderate depression, 15-19 = Moderately severe depression, 20-27 = Severe depression   Psychosocial Evaluation and Intervention:  Psychosocial Evaluation - 06/08/20 1412      Psychosocial Evaluation & Interventions   Interventions Encouraged to exercise with the program and follow exercise prescription;Stress management education;Relaxation education    Comments Patient has no psychosocial barriers identified at his orientation visit. His initial QOL score was 23.47% and his PHQ-9 score was 4.    Expected Outcomes Patient will have no psychosocial issues identified at [facility].    Continue Psychosocial Services  No Follow up required           Psychosocial Re-Evaluation:  Psychosocial Re-Evaluation    [place] Name 07/01/20 0800 07/27/20 1126 08/24/20 0739         Psychosocial Re-Evaluation   Current issues with None Identified -- None Identified     Comments Patient is new to the program but continues to have no psychosocial issues identified. He continues to deal with the loss of his daughter but does continue  to demonstrate a positive outlook. Will continue to monitor. Patient continues to have no psychosocial issues identified completing 6 sessions. He has recently been hospitalized with CHF exacerbation. Was discharged 07/25/20. He has been doing well in the program demonstrating a positive attitude. Will continue to  monitor. Patient continues to have no psychosocial issues identified completing 7 sessions. He has been absent for 4 weeks after hospitalization with volumn overload. He has been cleared by Dr. Harrington Challenger to return to the program. He says he is glad to be back.  He has been doing well in the program demonstrating a positive attitude. Will continue to monitor.     Expected Outcomes Patient will have no psychosocial issues identified at discharge. Patient will have no psychosocial issues identified at discharge. Patient will have no psychosocial issues identified at discharge.     Interventions Stress management education;Encouraged to attend Cardiac Rehabilitation for the exercise;Relaxation education Stress management education;Encouraged to attend Cardiac Rehabilitation for the exercise;Relaxation education Stress management education;Encouraged to attend Cardiac Rehabilitation for the exercise;Relaxation education     Continue Psychosocial Services  No Follow up required No Follow up required No Follow up required            Psychosocial Discharge (Final Psychosocial Re-Evaluation):  Psychosocial Re-Evaluation - 08/24/20 0739      Psychosocial Re-Evaluation   Current issues with None Identified    Comments Patient continues to have no psychosocial issues identified completing 7 sessions. He has been absent for 4 weeks after hospitalization with volumn overload. He has been cleared by Dr. Harrington Challenger to return to the program. He says he is glad to be back.  He has been doing well in the program demonstrating a positive attitude. Will continue to monitor.    Expected Outcomes Patient will have no psychosocial issues identified at discharge.    Interventions Stress management education;Encouraged to attend Cardiac Rehabilitation for the exercise;Relaxation education    Continue Psychosocial Services  No Follow up required           Vocational Rehabilitation: Provide vocational rehab assistance to  qualifying candidates.   Vocational Rehab Evaluation & Intervention:  Vocational Rehab - 06/08/20 1406      Initial Vocational Rehab Evaluation & Intervention   Assessment shows need for Vocational Rehabilitation No      Vocational Rehab Re-Evaulation   Comments Patient is a retired Company secretary. He is not interested in returning to work and does not need vocational rehab.           Education: Education Goals: Education classes will be provided on a weekly basis, covering required topics. Participant will state understanding/return demonstration of topics presented.  Learning Barriers/Preferences:  Learning Barriers/Preferences - 06/08/20 1405      Learning Barriers/Preferences   Learning Barriers None    Learning Preferences Written Material;Video;Skilled Demonstration;Verbal Instruction;Audio           Education Topics: Hypertension, Hypertension Reduction -Define heart disease and high blood pressure. Discus how high blood pressure affects the body and ways to reduce high blood pressure.   Exercise and Your Heart -Discuss why it is important to exercise, the FITT principles of exercise, normal and abnormal responses to exercise, and how to exercise safely.   Angina -Discuss definition of angina, causes of angina, treatment of angina, and how to decrease risk of having angina.   Cardiac Medications -Review what the following cardiac medications are used for, how they affect the body, and side effects that may occur when taking the medications.  Medications include Aspirin, Beta blockers, calcium channel blockers, ACE Inhibitors, angiotensin receptor blockers, diuretics, digoxin, and antihyperlipidemics.   Congestive Heart Failure -Discuss the definition of CHF, how to live with CHF, the signs and symptoms of CHF, and how keep track of weight and sodium intake. Flowsheet Row CARDIAC REHAB PHASE II EXERCISE from 08/24/2020 in [place]  Date 08/24/20   Educator mk  Instruction Review Code 2- Demonstrated Understanding      Heart Disease and Intimacy -Discus the effect sexual activity has on the heart, how changes occur during intimacy as we age, and safety during sexual activity.   Smoking Cessation / COPD -Discuss different methods to quit smoking, the health benefits of quitting smoking, and the definition of COPD.   Nutrition I: Fats -Discuss the types of cholesterol, what cholesterol does to the heart, and how cholesterol levels can be controlled.   Nutrition II: Labels -Discuss the different components of food labels and how to read food label   Heart Parts/Heart Disease and PAD -Discuss the anatomy of the heart, the pathway of blood circulation through the heart, and these are affected by heart disease.   Stress I: Signs and Symptoms -Discuss the causes of stress, how stress may lead to anxiety and depression, and ways to limit stress.   Stress II: Relaxation -Discuss different types of relaxation techniques to limit stress.   Warning Signs of Stroke / TIA -Discuss definition of a stroke, what the signs and symptoms are of a stroke, and how to identify when someone is having stroke.   Knowledge Questionnaire Score:  Knowledge Questionnaire Score - 06/08/20 1406      Knowledge Questionnaire Score   Pre Score 18/24           Core Components/Risk Factors/Patient Goals at Admission:  Personal Goals and Risk Factors at Admission - 06/08/20 1406      Core Components/Risk Factors/Patient Goals on Admission    Weight Management Obesity;Yes    Intervention Weight Management: Develop a combined nutrition and exercise program designed to reach desired caloric intake, while maintaining appropriate intake of nutrient and fiber, sodium and fats, and appropriate energy expenditure required for the weight goal.;Weight Management: Provide education and appropriate resources to help participant work on and attain dietary  goals.;Weight Management/Obesity: Establish reasonable short term and long term weight goals.;Obesity: Provide education and appropriate resources to help participant work on and attain dietary goals.    Admit Weight 303 lb (137.4 kg)    Goal Weight: Short Term 298 lb (135.2 kg)    Goal Weight: Long Term 293 lb (132.9 kg)    Personal Goal Other Yes    Personal Goal Get stronger; get back to doing his ADL's. Lose more weight: 10 lbs in the program and more long term.    Intervention Patient will attend CR 3 days/week and supplement with exercise at home 3 days/week.    Expected Outcomes Patient will meet both personal and program goals.           Core Components/Risk Factors/Patient Goals Review:   Goals and Risk Factor Review    Row Name 07/01/20 0802 07/27/20 1521 08/24/20 0741         Core Components/Risk Factors/Patient Goals Review   Personal Goals Review Weight Management/Obesity Weight Management/Obesity Weight Management/Obesity     Review Patient is new the the program completing 3 sessions. He has multiple risk factors for CAD and is doing the program for risk modification. His personal goals are  to get stronger; be able to do his ADL's and lose 10 lbs in the program and more long-term. We will cotinue to monitor as he works toward meeting both personal and program goals. Patient has completed 6 sessions gaining 10 lbs since his initial visit. His personal goals are to get stronger; be able to do his ADL's; lose 10 lbs in the praogram and more long-term. His pcp referred him to cardiology with SOB. He saw Dr. Harrington Challenger 07/20/20 and she admitted him to the hospital with volume overload for diuresis. He was discharged 07/25/20. He has a follow up visit with Dr. Harrington Challenger 1/17. She does not want him to start back CR until his f/u visit. Patient has been notified. Will continue to monitor. Patient has completed 7 sessions. He lost 16 lbs since he came a month ago after his ER admission and treatment  for volume overload. He has been cleared to return to the program by Dr. Harrington Challenger. He saw her for his follow up visit 08/05/20. She added Metolazone to take 30 minutes prior to taking his lasix. He last A1C was 07/21/20 at 6.5% which is trending up from 5 months ago at 6%. His personal goals for the program are to get stronger; be able to do his ADL's; lose 10 lbs in the program and more long term. We will continue to monitor for progress as he works toward meeting these goals.     Expected Outcomes Patient will complete the program meeting both personal and program goals. Patient will complete the program meeting both personal and program goals. Patient will complete the program meeting both personal and program goals.            Core Components/Risk Factors/Patient Goals at Discharge (Final Review):   Goals and Risk Factor Review - 08/24/20 0741      Core Components/Risk Factors/Patient Goals Review   Personal Goals Review Weight Management/Obesity    Review Patient has completed 7 sessions. He lost 16 lbs since he came a month ago after his ER admission and treatment for volume overload. He has been cleared to return to the program by Dr. Harrington Challenger. He saw her for his follow up visit 08/05/20. She added Metolazone to take 30 minutes prior to taking his lasix. He last A1C was 07/21/20 at 6.5% which is trending up from 5 months ago at 6%. His personal goals for the program are to get stronger; be able to do his ADL's; lose 10 lbs in the program and more long term. We will continue to monitor for progress as he works toward meeting these goals.    Expected Outcomes Patient will complete the program meeting both personal and program goals.           ITP Comments:   Comments: ITP REVIEW Pt is making expected progress toward Cardiac Rehab goals after completing 9 sessions. Recommend continued exercise, life style modification, education, and increased stamina and strength.

## 2020-09-01 ENCOUNTER — Other Ambulatory Visit: Payer: Self-pay | Admitting: Internal Medicine

## 2020-09-01 ENCOUNTER — Other Ambulatory Visit: Payer: Medicare Other

## 2020-09-01 ENCOUNTER — Other Ambulatory Visit: Payer: Self-pay

## 2020-09-01 DIAGNOSIS — I5033 Acute on chronic diastolic (congestive) heart failure: Secondary | ICD-10-CM | POA: Diagnosis not present

## 2020-09-02 ENCOUNTER — Encounter (HOSPITAL_COMMUNITY)
Admission: RE | Admit: 2020-09-02 | Discharge: 2020-09-02 | Disposition: A | Discharge status: Home / Self Care | Payer: Medicare Other | Source: Ambulatory Visit | Attending: Cardiology | Admitting: Cardiology

## 2020-09-02 DIAGNOSIS — Z955 Presence of coronary angioplasty implant and graft: Secondary | ICD-10-CM | POA: Diagnosis not present

## 2020-09-02 DIAGNOSIS — I2121 ST elevation (STEMI) myocardial infarction involving left circumflex coronary artery: Secondary | ICD-10-CM

## 2020-09-02 LAB — BASIC METABOLIC PANEL
BUN/Creatinine Ratio: 22 (ref 10–24)
BUN: 23 mg/dL (ref 8–27)
CO2: 20 mmol/L (ref 20–29)
Calcium: 9.5 mg/dL (ref 8.6–10.2)
Chloride: 103 mmol/L (ref 96–106)
Creatinine, Ser: 1.04 mg/dL (ref 0.76–1.27)
GFR calc Af Amer: 80 mL/min/{1.73_m2} (ref >59)
GFR calc non Af Amer: 69 mL/min/{1.73_m2} (ref >59)
Glucose: 105 mg/dL — ABNORMAL HIGH (ref 65–99)
Potassium: 4.8 mmol/L (ref 3.5–5.2)
Sodium: 140 mmol/L (ref 134–144)

## 2020-09-02 LAB — PRO B NATRIURETIC PEPTIDE: NT-Pro BNP: 3472 pg/mL — ABNORMAL HIGH (ref 0–486)

## 2020-09-02 NOTE — Progress Notes (Signed)
Daily Session Note  Patient Details  Name: Ryan Garner MRN: [id number] Date of Birth: [birth year] Referring Provider:   Flowsheet Row CARDIAC REHAB PHASE II ORIENTATION from 06/08/2020 in [place]  Referring Provider Dr. Harrington Challenger      Encounter Date: 09/02/2020  Check In:  Session Check In - 09/02/20 1445      Check-In   Supervising physician immediately available to respond to emergencies [facility] MD immediately available    Physician(s) Dr. Johnsie Cancel    Location [facility]    Staff Present Cathren Harsh, MS, Exercise Physiologist;Debra Wynetta Emery, RN, Bjorn Loser, MS, ACSM-CEP, Exercise Physiologist;Phyllis Billingsley, RN    Virtual Visit No    Medication changes reported     No    Fall or balance concerns reported    No    Tobacco Cessation No Change    Warm-up and Cool-down Performed as group-led instruction    Resistance Training Performed Yes    VAD Patient? No    PAD/SET Patient? No      Pain Assessment   Currently in Pain? No/denies    Pain Score 0-No pain    Multiple Pain Sites No           Capillary Blood Glucose: No results found for this or any previous visit (from the past 24 hour(s)).    Social History   Tobacco Use  Smoking Status Former Smoker  . Types: Cigarettes  . Quit date: 04/02/1959  . Years since quitting: 61.4  Smokeless Tobacco Never Used    Goals Met:  Independence with exercise equipment Exercise tolerated well No report of cardiac concerns or symptoms Strength training completed today  Goals Unmet:  Not Applicable  Comments: checkout time is 1345   Dr. Kathie Dike is Medical Director for [facility] Pulmonary Rehab.

## 2020-09-02 NOTE — Progress Notes (Signed)
I have reviewed a Home Exercise Prescription with [name] . Angelina is currently exercising at home.  The patient was advised to walk 2-3 days a week for 20-30 minutes outside of rehab.  Ventura and I discussed how to progress their exercise prescription.  The patient stated that their goals were to get stronger and lose weight.  The patient stated that they understand the exercise prescription.  We reviewed exercise guidelines, target heart rate during exercise, RPE Scale, weather conditions, NTG use, endpoints for exercise, warmup and cool down.  Patient is encouraged to come to me with any questions. I will continue to follow up with the patient to assist them with progression and safety.

## 2020-09-05 ENCOUNTER — Encounter (HOSPITAL_COMMUNITY)
Admission: RE | Admit: 2020-09-05 | Discharge: 2020-09-05 | Disposition: A | Discharge status: Home / Self Care | Payer: Medicare Other | Source: Ambulatory Visit | Attending: Cardiology | Admitting: Cardiology

## 2020-09-05 ENCOUNTER — Other Ambulatory Visit: Payer: Self-pay

## 2020-09-05 DIAGNOSIS — I2121 ST elevation (STEMI) myocardial infarction involving left circumflex coronary artery: Secondary | ICD-10-CM

## 2020-09-05 DIAGNOSIS — Z955 Presence of coronary angioplasty implant and graft: Secondary | ICD-10-CM | POA: Diagnosis not present

## 2020-09-05 NOTE — Progress Notes (Signed)
Daily Session Note  Patient Details  Name: Ryan Garner MRN: [id number] Date of Birth: September 06, 1945 Referring Provider:   Flowsheet Row CARDIAC REHAB PHASE II ORIENTATION from 06/08/2020 in [place]  Referring Provider Dr. Harrington Challenger      Encounter Date: 09/05/2020  Check In:  Session Check In - 09/05/20 1445      Check-In   Supervising physician immediately available to respond to emergencies [facility] MD immediately available    Physician(s) Dr. Johnsie Cancel    Location [facility]    Staff Present Aundra Dubin, RN, BSN;[place] Kearse Audria Nine, MS, Exercise Physiologist    Virtual Visit No    Medication changes reported     No    Fall or balance concerns reported    No    Tobacco Cessation No Change    Warm-up and Cool-down Performed as group-led instruction    Resistance Training Performed Yes    VAD Patient? No    PAD/SET Patient? No      Pain Assessment   Currently in Pain? No/denies    Pain Score 0-No pain    Multiple Pain Sites No           Capillary Blood Glucose: No results found for this or any previous visit (from the past 24 hour(s)).    Social History   Tobacco Use  Smoking Status Former Smoker  . Types: Cigarettes  . Quit date: 04/02/1959  . Years since quitting: 61.4  Smokeless Tobacco Never Used    Goals Met:  Independence with exercise equipment Exercise tolerated well No report of cardiac concerns or symptoms Strength training completed today  Goals Unmet:  Not Applicable  Comments: check out 1545   Dr. Kathie Dike is Medical Director for [facility] Pulmonary Rehab.

## 2020-09-06 ENCOUNTER — Ambulatory Visit: Payer: Medicare Other | Admitting: Licensed Clinical Social Worker

## 2020-09-06 ENCOUNTER — Telehealth: Payer: Self-pay | Admitting: *Deleted

## 2020-09-06 DIAGNOSIS — I10 Essential (primary) hypertension: Secondary | ICD-10-CM

## 2020-09-06 DIAGNOSIS — I5033 Acute on chronic diastolic (congestive) heart failure: Secondary | ICD-10-CM

## 2020-09-06 DIAGNOSIS — I7121 Aneurysm of the ascending aorta, without rupture: Secondary | ICD-10-CM

## 2020-09-06 DIAGNOSIS — Z87828 Personal history of other (healed) physical injury and trauma: Secondary | ICD-10-CM

## 2020-09-06 DIAGNOSIS — I712 Thoracic aortic aneurysm, without rupture: Secondary | ICD-10-CM

## 2020-09-06 DIAGNOSIS — E1169 Type 2 diabetes mellitus with other specified complication: Secondary | ICD-10-CM

## 2020-09-06 DIAGNOSIS — E782 Mixed hyperlipidemia: Secondary | ICD-10-CM

## 2020-09-06 MED ORDER — FUROSEMIDE 40 MG PO TABS: 80.0000 mg | ORAL_TABLET | Freq: Every day | ORAL | 1 refills | Status: DC

## 2020-09-06 NOTE — Telephone Encounter (Signed)
Pt aware of results and recommendations.  He will change Lasix to 80 mg daily and return on 09/28/20 for repeat labs.

## 2020-09-06 NOTE — Patient Instructions (Addendum)
Licensed Clinical Social Worker Note:  09/06/2020.  Name: Ryan Garner          MRN: [id number]       DOB: 02/17/1946  Ryan Garner is a 75 y.o. year old male who is a primary care patient of [name], [name], MD. The CCM team was consulted for assistance with [name] .   Review of patient status, including review of consultants reports, other relevant assessments, and collaboration with appropriate care team members and the patient's provider was performed as part of comprehensive patient evaluation and provision of chronic care management services.    SDOH (Social Determinants of Health) assessments performed: No; risk for depression; risk for tobacco use; risk for stress; risk for physical inactivity  Materials Provided:  No  LCSW called client home phone number today but LCSW was not able to speak via phone today with client; however, LCSW did leave phone message for Ryan Garner asking him to please call LCSW at [phone] to discuss current client needs  Follow Up Plan: LCSW to call client/spouse of clientin next 4 weeks to talk withclient/spouse about client management of health needs faced and about client completion of daily activities.  LCSW was not able to speak via phone today with client; thus, client was not able to verbalize understanding of instructions provided today and was not able to accept or  decline a print copy of patient instruction materials.   Ryan Garner MSW, LCSW Licensed Clinical Social Worker [facility] Care Management [phone]

## 2020-09-06 NOTE — Telephone Encounter (Signed)
-----   Message from Dorris Carnes V, MD sent at 09/06/2020  4:28 PM EST ----- Kidney function and electrolytes are OK Fluid is still increased I would recomm changing  lasix to 80 mg at once (not 40 mg 2x per day) Patient should have BMET and BNP in 3 wks

## 2020-09-06 NOTE — Chronic Care Management (AMB) (Signed)
  Chronic Care Management    Clinical Social Work Follow Up Note  09/06/2020 Name: Ryan Garner MRN: [id number] DOB: 07-31-1945  Ryan Garner is a 75 y.o. year old male who is a primary care patient of [name], [name], MD. The CCM team was consulted for assistance with [name] .   Review of patient status, including review of consultants reports, other relevant assessments, and collaboration with appropriate care team members and the patient's provider was performed as part of comprehensive patient evaluation and provision of chronic care management services.    SDOH (Social Determinants of Health) assessments performed: No; risk for depression; risk for tobacco use; risk for stress; risk for physical inactivity  [place] from 06/08/2020 in [place]  PHQ-9 Total Score 4     GAD 7 : Generalized Anxiety Score 01/12/2020  Nervous, Anxious, on Edge 0  Control/stop worrying 1  Worry too much - different things 0  Trouble relaxing 1  Restless 0  Easily annoyed or irritable 1  Afraid - awful might happen 0  Total GAD 7 Score 3  Anxiety Difficulty Somewhat difficult    Outpatient Encounter Medications as of 09/06/2020  Medication Sig  . apixaban (ELIQUIS) 5 MG TABS tablet Take 1 tablet (5 mg total) by mouth 2 (two) times daily.  Marland Kitchen atorvastatin (LIPITOR) 80 MG tablet Take 1 tablet (80 mg total) by mouth daily.  . clopidogrel (PLAVIX) 75 MG tablet Take 1 tablet (75 mg total) by mouth daily with breakfast.  . fluticasone (FLONASE) 50 MCG/ACT nasal spray Place 2 sprays into both nostrils daily.  . furosemide (LASIX) 40 MG tablet Take 1 tablet (40 mg total) by mouth 2 (two) times daily.  . metolazone (ZAROXOLYN) 5 MG tablet Take 1 tablet (5 mg total) by mouth as needed (dypsnea or weight up 3 lbs).  . metoprolol tartrate (LOPRESSOR) 25 MG tablet Take 1.5 tablets (37.5 mg total) by mouth 2 (two) times daily.  .  Misc Natural Products (PROSTATE SUPPORT) 300-15 MG TABS Take 1 tablet by mouth daily. Super Beta Prostate  . Multiple Vitamin (MULTIVITAMIN WITH MINERALS) TABS tablet Take 1 tablet by mouth daily.  . nitroGLYCERIN (NITROSTAT) 0.4 MG SL tablet Place 1 tablet (0.4 mg total) under the tongue every 5 (five) minutes x 3 doses as needed for chest pain.  . potassium chloride SA (KLOR-CON) 20 MEQ tablet Take 1 tablet (20 mEq total) by mouth daily.   No facility-administered encounter medications on file as of 09/06/2020.    LCSW called client home phone number today but LCSW was not able to speak via phone today with client; however, LCSW did leave phone message for Zach asking him to please call LCSW at [phone] to discuss current client needs  Follow Up Plan:  LCSW to call client/spouse of clientin next 4 weeks to talk withclient/spouse about client management of health needs faced and about client completion of daily activities.  Norva Riffle.Ramonte Mena MSW, LCSW Licensed Clinical Social Worker [facility] Care Management [phone]

## 2020-09-07 ENCOUNTER — Encounter (HOSPITAL_COMMUNITY)
Admission: RE | Admit: 2020-09-07 | Discharge: 2020-09-07 | Disposition: A | Discharge status: Home / Self Care | Payer: Medicare Other | Source: Ambulatory Visit | Attending: Cardiology | Admitting: Cardiology

## 2020-09-07 ENCOUNTER — Other Ambulatory Visit: Payer: Self-pay

## 2020-09-07 DIAGNOSIS — Z955 Presence of coronary angioplasty implant and graft: Secondary | ICD-10-CM

## 2020-09-07 DIAGNOSIS — I2121 ST elevation (STEMI) myocardial infarction involving left circumflex coronary artery: Secondary | ICD-10-CM

## 2020-09-07 NOTE — Progress Notes (Addendum)
Daily Session Note  Patient Details  Name: Ryan Garner MRN: [id number] Date of Birth: 11/25/1945 Referring Provider:   Flowsheet Row CARDIAC REHAB PHASE II ORIENTATION from 06/08/2020 in [place]  Referring Provider Dr. Harrington Challenger      Encounter Date: 09/07/2020  Check In:  Session Check In - 09/07/20 1445      Check-In   Supervising physician immediately available to respond to emergencies [facility] MD immediately available    Physician(s) Dr. Johnsie Cancel    Location [facility]    Staff Present Aundra Dubin, RN, BSN;[place] Audria Nine, MS, Exercise Physiologist;Tracer Gut[place] Kris Mouton, MS, ACSM-CEP, Exercise Physiologist    Virtual Visit No    Medication changes reported     No    Fall or balance concerns reported    No    Tobacco Cessation No Change    Warm-up and Cool-down Performed as group-led instruction    Resistance Training Performed Yes    VAD Patient? No    PAD/SET Patient? No      Pain Assessment   Currently in Pain? No/denies    Pain Score 0-No pain    Multiple Pain Sites No           Capillary Blood Glucose: No results found for this or any previous visit (from the past 24 hour(s)).    Social History   Tobacco Use  Smoking Status Former Smoker  . Types: Cigarettes  . Quit date: 04/02/1959  . Years since quitting: 61.4  Smokeless Tobacco Never Used    Goals Met:  Independence with exercise equipment Exercise tolerated well No report of cardiac concerns or symptoms Strength training completed today  Goals Unmet:  Not Applicable  Comments: checkout time is 1545   Dr. Kathie Dike is Medical Director for [facility] Pulmonary Rehab.

## 2020-09-09 ENCOUNTER — Other Ambulatory Visit: Payer: Self-pay

## 2020-09-09 ENCOUNTER — Encounter (HOSPITAL_COMMUNITY)
Admission: RE | Admit: 2020-09-09 | Discharge: 2020-09-09 | Disposition: A | Discharge status: Home / Self Care | Payer: Medicare Other | Source: Ambulatory Visit | Attending: Cardiology | Admitting: Cardiology

## 2020-09-09 DIAGNOSIS — I2121 ST elevation (STEMI) myocardial infarction involving left circumflex coronary artery: Secondary | ICD-10-CM | POA: Diagnosis not present

## 2020-09-09 DIAGNOSIS — Z955 Presence of coronary angioplasty implant and graft: Secondary | ICD-10-CM

## 2020-09-09 NOTE — Progress Notes (Signed)
Daily Session Note  Patient Details  Name: Ryan Garner MRN: [id number] Date of Birth: [birth year] Referring Provider:   Flowsheet Row CARDIAC REHAB PHASE II ORIENTATION from 06/08/2020 in [place]  Referring Provider Dr. Harrington Challenger      Encounter Date: 09/09/2020  Check In:  Session Check In - 09/09/20 1445      Check-In   Supervising physician immediately available to respond to emergencies [facility] MD immediately available    Physician(s) Dr. Johnsie Cancel    Location [facility]    Staff Present Aundra Dubin, RN, BSN;Adarsh Mundorf Audria Nine, MS, Exercise Physiologist    Virtual Visit No    Medication changes reported     No    Fall or balance concerns reported    No    Tobacco Cessation No Change    Warm-up and Cool-down Performed as group-led instruction    Resistance Training Performed Yes    VAD Patient? No    PAD/SET Patient? No      Pain Assessment   Currently in Pain? No/denies    Pain Score 0-No pain    Multiple Pain Sites No           Capillary Blood Glucose: No results found for this or any previous visit (from the past 24 hour(s)).    Social History   Tobacco Use  Smoking Status Former Smoker  . Types: Cigarettes  . Quit date: 04/02/1959  . Years since quitting: 61.4  Smokeless Tobacco Never Used    Goals Met:  Independence with exercise equipment Exercise tolerated well No report of cardiac concerns or symptoms Strength training completed today  Goals Unmet:  Not Applicable  Comments: check out 1545   Dr. Kathie Dike is Medical Director for [facility] Pulmonary Rehab.

## 2020-09-12 ENCOUNTER — Other Ambulatory Visit: Payer: Self-pay

## 2020-09-12 ENCOUNTER — Encounter (HOSPITAL_COMMUNITY)
Admission: RE | Admit: 2020-09-12 | Discharge: 2020-09-12 | Disposition: A | Discharge status: Home / Self Care | Payer: Medicare Other | Source: Ambulatory Visit | Attending: Cardiology | Admitting: Cardiology

## 2020-09-12 VITALS — Wt 289.9 lb

## 2020-09-12 DIAGNOSIS — Z955 Presence of coronary angioplasty implant and graft: Secondary | ICD-10-CM | POA: Diagnosis not present

## 2020-09-12 DIAGNOSIS — I2121 ST elevation (STEMI) myocardial infarction involving left circumflex coronary artery: Secondary | ICD-10-CM | POA: Diagnosis not present

## 2020-09-12 NOTE — Progress Notes (Signed)
Daily Session Note  Patient Details  Name: Ryan Garner MRN: [id number] Date of Birth: [birth year] Referring Provider:   Flowsheet Row CARDIAC REHAB PHASE II ORIENTATION from 06/08/2020 in [place]  Referring Provider Dr. Harrington Challenger      Encounter Date: 09/12/2020  Check In:  Session Check In - 09/12/20 1445      Check-In   Supervising physician immediately available to respond to emergencies [facility] MD immediately available    Physician(s) Dr. Johnsie Cancel    Location [facility]    Staff Present Cathren Harsh, MS, Exercise Physiologist;[place] Kris Mouton, MS, ACSM-CEP, Exercise Physiologist    Virtual Visit No    Medication changes reported     No    Fall or balance concerns reported    No    Tobacco Cessation No Change    Warm-up and Cool-down Performed as group-led instruction    Resistance Training Performed Yes    VAD Patient? No    PAD/SET Patient? No      Pain Assessment   Currently in Pain? No/denies    Pain Score 0-No pain    Multiple Pain Sites No           Capillary Blood Glucose: No results found for this or any previous visit (from the past 24 hour(s)).    Social History   Tobacco Use  Smoking Status Former Smoker  . Types: Cigarettes  . Quit date: 04/02/1959  . Years since quitting: 61.4  Smokeless Tobacco Never Used    Goals Met:  Independence with exercise equipment Exercise tolerated well No report of cardiac concerns or symptoms Strength training completed today  Goals Unmet:  Not Applicable  Comments: check out 1545   Dr. Kathie Dike is Medical Director for [facility] Pulmonary Rehab.

## 2020-09-14 ENCOUNTER — Other Ambulatory Visit: Payer: Self-pay

## 2020-09-14 ENCOUNTER — Encounter (HOSPITAL_COMMUNITY)
Admission: RE | Admit: 2020-09-14 | Discharge: 2020-09-14 | Disposition: A | Discharge status: Home / Self Care | Payer: Medicare Other | Source: Ambulatory Visit | Attending: Cardiology | Admitting: Cardiology

## 2020-09-14 DIAGNOSIS — Z955 Presence of coronary angioplasty implant and graft: Secondary | ICD-10-CM

## 2020-09-14 DIAGNOSIS — I2121 ST elevation (STEMI) myocardial infarction involving left circumflex coronary artery: Secondary | ICD-10-CM | POA: Diagnosis not present

## 2020-09-14 NOTE — Progress Notes (Signed)
Daily Session Note  Patient Details  Name: Ryan Garner MRN: [id number] Date of Birth: 08-Feb-1946 Referring Provider:   Flowsheet Row CARDIAC REHAB PHASE II ORIENTATION from 06/08/2020 in [place]  Referring Provider Dr. Harrington Challenger      Encounter Date: 09/14/2020  Check In:  Session Check In - 09/14/20 [place]      Check-In   Supervising physician immediately available to respond to emergencies [facility] MD immediately available    Physician(s) Dr. Domenic Polite    Location [facility]    Staff Present Cathren Harsh, MS, Exercise Physiologist;Debra Wynetta Emery, RN, Bjorn Loser, MS, ACSM-CEP, Exercise Physiologist    Virtual Visit No    Medication changes reported     No    Fall or balance concerns reported    No    Tobacco Cessation No Change    Warm-up and Cool-down Performed as group-led instruction    Resistance Training Performed Yes    VAD Patient? No    PAD/SET Patient? No      Pain Assessment   Currently in Pain? No/denies    Pain Score 0-No pain    Multiple Pain Sites No           Capillary Blood Glucose: No results found for this or any previous visit (from the past 24 hour(s)).    Social History   Tobacco Use  Smoking Status Former Smoker  . Types: Cigarettes  . Quit date: 04/02/1959  . Years since quitting: 61.4  Smokeless Tobacco Never Used    Goals Met:  Independence with exercise equipment Exercise tolerated well No report of cardiac concerns or symptoms Strength training completed today  Goals Unmet:  Not Applicable  Comments: check out 1545   Dr. Kathie Dike is Medical Director for [facility].

## 2020-09-15 ENCOUNTER — Other Ambulatory Visit: Payer: Self-pay

## 2020-09-15 ENCOUNTER — Ambulatory Visit (INDEPENDENT_AMBULATORY_CARE_PROVIDER_SITE_OTHER): Payer: Medicare Other | Admitting: Family Medicine

## 2020-09-15 ENCOUNTER — Encounter: Payer: Self-pay | Admitting: Family Medicine

## 2020-09-15 VITALS — BP 108/70 | HR 93 | Ht 68.0 in | Wt 286.0 lb

## 2020-09-15 DIAGNOSIS — I2121 ST elevation (STEMI) myocardial infarction involving left circumflex coronary artery: Secondary | ICD-10-CM

## 2020-09-15 DIAGNOSIS — J329 Chronic sinusitis, unspecified: Secondary | ICD-10-CM

## 2020-09-15 MED ORDER — PREDNISONE 20 MG PO TABS: ORAL_TABLET | ORAL | 0 refills | Status: DC

## 2020-09-15 MED ORDER — CEFDINIR 300 MG PO CAPS: 300.0000 mg | ORAL_CAPSULE | Freq: Two times a day (BID) | ORAL | 0 refills | Status: DC

## 2020-09-15 NOTE — Progress Notes (Signed)
BP 108/70   Pulse 93   Ht 5\' 8"  (1.727 m)   Wt 286 lb (129.7 kg)   SpO2 98%   BMI 43.49 kg/m    Subjective:   Patient ID: Ryan Garner, male    DOB: 1945-09-04, 75 y.o.   MRN: [id number]  HPI: Ryan Garner is a 75 y.o. male presenting on 09/15/2020 for Medical Management of Chronic Issues and Sinusitis (63m follow up)   HPI Patient is coming in complaining of 2 months of sinus congestion and pressure and cough and drainage that has not improved.  He says he took amoxicillin and prednisone and it was better on the prednisone but assumes it was finishing him right back and has been dealing with a still little pressure and swelling and eye irritation and drainage.  He denies any fevers or chills or shortness of breath or wheezing.  He does have Flonase but is not using it every day.  He is not currently taking any kind of antihistamine.  Relevant past medical, surgical, family and social history reviewed and updated as indicated. Interim medical history since our last visit reviewed. Allergies and medications reviewed and updated.  Review of Systems  Constitutional: Negative for chills and fever.  HENT: Positive for congestion, postnasal drip, rhinorrhea and sinus pressure. Negative for ear discharge, ear pain, sneezing, sore throat and voice change.   Eyes: Negative for pain, discharge, redness and visual disturbance.  Respiratory: Negative for cough, shortness of breath and wheezing.   Cardiovascular: Negative for chest pain and leg swelling.  Musculoskeletal: Negative for gait problem.  Skin: Negative for rash.  Neurological: Positive for headaches.  All other systems reviewed and are negative.   Per HPI unless specifically indicated above   Allergies as of 09/15/2020      Reactions   Flecainide    Per patient dizziness, shortness of breath, blurred vision   Rofecoxib Other (See Comments)   (Vioxx)      Medication List       Accurate as of September 15, 2020  1:17 PM.  If you have any questions, ask your nurse or doctor.        apixaban 5 MG Tabs tablet Commonly known as: ELIQUIS Take 1 tablet (5 mg total) by mouth 2 (two) times daily.   atorvastatin 80 MG tablet Commonly known as: LIPITOR Take 1 tablet (80 mg total) by mouth daily.   clopidogrel 75 MG tablet Commonly known as: PLAVIX Take 1 tablet (75 mg total) by mouth daily with breakfast.   fluticasone 50 MCG/ACT nasal spray Commonly known as: FLONASE Place 2 sprays into both nostrils daily.   furosemide 40 MG tablet Commonly known as: LASIX Take 2 tablets (80 mg total) by mouth daily.   metolazone 5 MG tablet Commonly known as: ZAROXOLYN Take 1 tablet (5 mg total) by mouth as needed (dypsnea or weight up 3 lbs).   metoprolol tartrate 25 MG tablet Commonly known as: LOPRESSOR Take 1.5 tablets (37.5 mg total) by mouth 2 (two) times daily.   multivitamin with minerals Tabs tablet Take 1 tablet by mouth daily.   nitroGLYCERIN 0.4 MG SL tablet Commonly known as: NITROSTAT Place 1 tablet (0.4 mg total) under the tongue every 5 (five) minutes x 3 doses as needed for chest pain.   potassium chloride SA 20 MEQ tablet Commonly known as: KLOR-CON Take 1 tablet (20 mEq total) by mouth daily.   Prostate Support 300-15 MG Tabs Take 1 tablet by mouth  daily. Super Beta Prostate        Objective:   BP 108/70   Pulse 93   Ht 5\' 8"  (1.727 m)   Wt 286 lb (129.7 kg)   SpO2 98%   BMI 43.49 kg/m   Wt Readings from Last 3 Encounters:  09/15/20 286 lb (129.7 kg)  09/13/20 289 lb 14.5 oz (131.5 kg)  08/30/20 291 lb 7.2 oz (132.2 kg)    Physical Exam Vitals and nursing note reviewed.  Constitutional:      General: He is not in acute distress.    Appearance: He is well-developed and well-nourished. He is not diaphoretic.  HENT:     Right Ear: Tympanic membrane and ear canal normal.     Left Ear: Tympanic membrane and ear canal normal.     Nose: Nose normal.     Mouth/Throat:      Mouth: Mucous membranes are moist.     Pharynx: Oropharynx is clear. No oropharyngeal exudate or posterior oropharyngeal erythema.  Eyes:     General: No scleral icterus.    Extraocular Movements: EOM normal.     Conjunctiva/sclera: Conjunctivae normal.  Neck:     Thyroid: No thyromegaly.  Cardiovascular:     Pulses: Intact distal pulses.  Musculoskeletal:        General: No edema. Normal range of motion.     Cervical back: Neck supple.  Lymphadenopathy:     Cervical: No cervical adenopathy.  Skin:    General: Skin is warm and dry.     Findings: No rash.  Neurological:     Mental Status: He is alert and oriented to person, place, and time.     Coordination: Coordination normal.  Psychiatric:        Mood and Affect: Mood and affect normal.        Behavior: Behavior normal.       Assessment & Plan:   Problem List Items Addressed This Visit   None   Visit Diagnoses    Sinusitis, unspecified chronicity, unspecified location    -  Primary   Relevant Medications   cefdinir (OMNICEF) 300 MG capsule   predniSONE (DELTASONE) 20 MG tablet      Give Omnicef and prednisone, recommend using antihistamine every day and start using Flonase every day Follow up plan: Return if symptoms worsen or fail to improve.  Counseling provided for all of the vaccine components No orders of the defined types were placed in this encounter.   Caryl Pina, MD [facility] 09/15/2020, 1:17 PM

## 2020-09-16 ENCOUNTER — Encounter (HOSPITAL_COMMUNITY)
Admission: RE | Admit: 2020-09-16 | Discharge: 2020-09-16 | Disposition: A | Discharge status: Home / Self Care | Payer: Medicare Other | Source: Ambulatory Visit | Attending: Cardiology | Admitting: Cardiology

## 2020-09-16 DIAGNOSIS — Z955 Presence of coronary angioplasty implant and graft: Secondary | ICD-10-CM | POA: Diagnosis not present

## 2020-09-16 DIAGNOSIS — I2121 ST elevation (STEMI) myocardial infarction involving left circumflex coronary artery: Secondary | ICD-10-CM

## 2020-09-16 NOTE — Progress Notes (Signed)
Daily Session Note  Patient Details  Name: STEPHANIE MCGLONE MRN: [id number] Date of Birth: May 11, 1946 Referring Provider:   Flowsheet Row CARDIAC REHAB PHASE II ORIENTATION from 06/08/2020 in [place]  Referring Provider Dr. Harrington Challenger      Encounter Date: 09/16/2020  Check In:  Session Check In - 09/16/20 1445      Check-In   Supervising physician immediately available to respond to emergencies [facility] MD immediately available    Physician(s) Dr.McDowell    Location [facility]    Staff Present Cathren Harsh, MS, Exercise Physiologist;Dalton Kris Mouton, MS, ACSM-CEP, Exercise Physiologist    Virtual Visit No    Medication changes reported     No    Fall or balance concerns reported    No    Tobacco Cessation No Change    Warm-up and Cool-down Performed as group-led instruction    Resistance Training Performed Yes    VAD Patient? No    PAD/SET Patient? No      Pain Assessment   Currently in Pain? No/denies    Pain Score 0-No pain    Multiple Pain Sites No           Capillary Blood Glucose: No results found for this or any previous visit (from the past 24 hour(s)).    Social History   Tobacco Use  Smoking Status Former Smoker  . Types: Cigarettes  . Quit date: 04/02/1959  . Years since quitting: 61.5  Smokeless Tobacco Never Used    Goals Met:  Independence with exercise equipment Exercise tolerated well No report of cardiac concerns or symptoms Strength training completed today  Goals Unmet:  Not Applicable  Comments: check out 1545   Dr. Kathie Dike is Medical Director for [facility] Pulmonary Re[facility].

## 2020-09-19 ENCOUNTER — Encounter (HOSPITAL_COMMUNITY)
Admission: RE | Admit: 2020-09-19 | Discharge: 2020-09-19 | Disposition: A | Discharge status: Home / Self Care | Payer: Medicare Other | Source: Ambulatory Visit | Attending: Internal Medicine | Admitting: Internal Medicine

## 2020-09-19 ENCOUNTER — Other Ambulatory Visit: Payer: Self-pay

## 2020-09-19 DIAGNOSIS — I2121 ST elevation (STEMI) myocardial infarction involving left circumflex coronary artery: Secondary | ICD-10-CM

## 2020-09-19 DIAGNOSIS — Z955 Presence of coronary angioplasty implant and graft: Secondary | ICD-10-CM | POA: Diagnosis not present

## 2020-09-19 NOTE — Progress Notes (Signed)
Daily Session Note  Patient Details  Name: Ryan Garner MRN: [id number] Date of Birth: 1946-07-03 Referring Provider:   Flowsheet Row CARDIAC REHAB PHASE II ORIENTATION from 06/08/2020 in [place]  Referring Provider Dr. Harrington Challenger      Encounter Date: 09/19/2020  Check In:  Session Check In - 09/19/20 1445      Check-In   Supervising physician immediately available to respond to emergencies [facility] MD immediately available    Physician(s) Dr. Harl Bowie    Location [facility]    Staff Present Aundra Dubin, RN, BSN;Jaidin Richison Audria Nine, MS, Exercise Physiologist    Virtual Visit No    Medication changes reported     No    Fall or balance concerns reported    No    Tobacco Cessation No Change    Warm-up and Cool-down Performed as group-led instruction    Resistance Training Performed Yes    VAD Patient? No    PAD/SET Patient? No      Pain Assessment   Currently in Pain? No/denies    Pain Score 0-No pain    Multiple Pain Sites No           Capillary Blood Glucose: No results found for this or any previous visit (from the past 24 hour(s)).    Social History   Tobacco Use  Smoking Status Former Smoker  . Types: Cigarettes  . Quit date: 04/02/1959  . Years since quitting: 61.5  Smokeless Tobacco Never Used    Goals Met:  Independence with exercise equipment Exercise tolerated well No report of cardiac concerns or symptoms Strength training completed today  Goals Unmet:  Not Applicable  Comments: check out 1545   Dr. Kathie Dike is Medical Director for [facility] Pulmonary Rehab.

## 2020-09-21 ENCOUNTER — Other Ambulatory Visit: Payer: Self-pay

## 2020-09-21 ENCOUNTER — Encounter (HOSPITAL_COMMUNITY)
Admission: RE | Admit: 2020-09-21 | Discharge: 2020-09-21 | Disposition: A | Discharge status: Home / Self Care | Payer: Medicare Other | Source: Ambulatory Visit | Attending: Internal Medicine | Admitting: Internal Medicine

## 2020-09-21 DIAGNOSIS — Z955 Presence of coronary angioplasty implant and graft: Secondary | ICD-10-CM | POA: Diagnosis not present

## 2020-09-21 DIAGNOSIS — I2121 ST elevation (STEMI) myocardial infarction involving left circumflex coronary artery: Secondary | ICD-10-CM | POA: Diagnosis not present

## 2020-09-21 NOTE — Progress Notes (Signed)
Daily Session Note  Patient Details  Name: Ryan Garner MRN: [id number] Date of Birth: 1945-09-20 Referring Provider:   Flowsheet Row CARDIAC REHAB PHASE II ORIENTATION from 06/08/2020 in [place]  Referring Provider Dr. Harrington Challenger      Encounter Date: 09/21/2020  Check In:  Session Check In - 09/21/20 1445      Check-In   Supervising physician immediately available to respond to emergencies [facility] MD immediately available    Physician(s) Dr. Harl Bowie    Location [facility]    Staff Present Aundra Dubin, RN, BSN;Phyllis Billingsley, RN;Madison Audria Nine, MS, Exercise Physiologist;Dalton Kris Mouton, MS, ACSM-CEP, Exercise Physiologist    Virtual Visit No    Medication changes reported     No    Fall or balance concerns reported    Yes    Comments Patient has history of fall x one in past year and reports losing his balance at times.    Tobacco Cessation No Change    Warm-up and Cool-down Performed as group-led instruction    Resistance Training Performed Yes    VAD Patient? No    PAD/SET Patient? No      Pain Assessment   Currently in Pain? No/denies    Pain Score 0-No pain    Multiple Pain Sites No           Capillary Blood Glucose: No results found for this or any previous visit (from the past 24 hour(s)).    Social History   Tobacco Use  Smoking Status Former Smoker  . Types: Cigarettes  . Quit date: 04/02/1959  . Years since quitting: 61.5  Smokeless Tobacco Never Used    Goals Met:  Independence with exercise equipment Exercise tolerated well No report of cardiac concerns or symptoms Strength training completed today  Goals Unmet:  Not Applicable  Comments: Check out [place]   Dr. Kathie Dike is Medical Director for [facility] Re[facility].

## 2020-09-23 ENCOUNTER — Other Ambulatory Visit: Payer: Self-pay

## 2020-09-23 ENCOUNTER — Encounter (HOSPITAL_COMMUNITY)
Admission: RE | Admit: 2020-09-23 | Discharge: 2020-09-23 | Disposition: A | Discharge status: Home / Self Care | Payer: Medicare Other | Source: Ambulatory Visit | Attending: Internal Medicine | Admitting: Internal Medicine

## 2020-09-23 DIAGNOSIS — I2121 ST elevation (STEMI) myocardial infarction involving left circumflex coronary artery: Secondary | ICD-10-CM | POA: Diagnosis not present

## 2020-09-23 DIAGNOSIS — Z955 Presence of coronary angioplasty implant and graft: Secondary | ICD-10-CM

## 2020-09-23 NOTE — Progress Notes (Signed)
Daily Session Note  Patient Details  Name: Ryan Garner MRN: [id number] Date of Birth: 1946/06/03 Referring Provider:   Flowsheet Row CARDIAC REHAB PHASE II ORIENTATION from 06/08/2020 in [place]  Referring Provider Dr. Harrington Challenger      Encounter Date: 09/23/2020  Check In:  Session Check In - 09/23/20 1443      Check-In   Supervising physician immediately available to respond to emergencies [facility] MD immediately available    Physician(s) Dr. Domenic Polite    Location [facility]    Staff Present Aundra Dubin, RN, BSN;Phyllis Billingsley, RN;Madison Audria Nine, MS, Exercise Physiologist;[place]app[place] Kris Mouton, MS, ACSM-CEP, Exercise Physiologist    Virtual Visit No    Medication changes reported     No    Fall or balance concerns reported    Yes    Comments Patient has history of fall x one in past year and reports losing his balance at times.    Tobacco Cessation No Change    Warm-up and Cool-down Performed as group-led instruction    Resistance Training Performed Yes    VAD Patient? No    PAD/SET Patient? No      Pain Assessment   Currently in Pain? No/denies    Pain Score 0-No pain    Multiple Pain Sites No           Capillary Blood Glucose: No results found for this or any previous visit (from the past 24 hour(s)).    Social History   Tobacco Use  Smoking Status Former Smoker  . Types: Cigarettes  . Quit date: 04/02/1959  . Years since quitting: 61.5  Smokeless Tobacco Never Used    Goals Met:  Independence with exercise equipment Exercise tolerated well No report of cardiac concerns or symptoms Strength training completed today  Goals Unmet:  Not Applicable  Comments: checkout time is 1545   Dr. Kathie Dike is Medical Director for [facility] Pulmonary Re[facility].

## 2020-09-26 ENCOUNTER — Encounter (HOSPITAL_COMMUNITY)
Admission: RE | Admit: 2020-09-26 | Discharge: 2020-09-26 | Disposition: A | Discharge status: Home / Self Care | Payer: Medicare Other | Source: Ambulatory Visit | Attending: Internal Medicine | Admitting: Internal Medicine

## 2020-09-26 ENCOUNTER — Other Ambulatory Visit: Payer: Self-pay

## 2020-09-26 VITALS — Wt 285.3 lb

## 2020-09-26 DIAGNOSIS — Z955 Presence of coronary angioplasty implant and graft: Secondary | ICD-10-CM

## 2020-09-26 DIAGNOSIS — I2121 ST elevation (STEMI) myocardial infarction involving left circumflex coronary artery: Secondary | ICD-10-CM | POA: Diagnosis not present

## 2020-09-26 NOTE — Progress Notes (Signed)
Daily Session Note  Patient Details  Name: Ryan Garner MRN: [id number] Date of Birth: May 11, 1946 Referring Provider:   Flowsheet Row CARDIAC REHAB PHASE II ORIENTATION from 06/08/2020 in [place]  Referring Provider Dr. Harrington Challenger      Encounter Date: 09/26/2020  Check In:  Session Check In - 09/26/20 [place]      Check-In   Supervising physician immediately available to respond to emergencies [facility] MD immediately available    Physician(s) Dr. Domenic Polite    Location [facility]    Staff Present Aundra Dubin, RN, BSN;Madison Audria Nine, MS, Exercise Physiologist;Dalton Kris Mouton, MS, ACSM-CEP, Exercise Physiologist    Virtual Visit No    Medication changes reported     No    Fall or balance concerns reported    Yes    Comments Patient has history of fall x one in past year and reports losing his balance at times.    Tobacco Cessation No Change    Warm-up and Cool-down Performed as group-led instruction    Resistance Training Performed Yes    VAD Patient? No    PAD/SET Patient? No      Pain Assessment   Currently in Pain? No/denies    Pain Score 0-No pain    Multiple Pain Sites No           Capillary Blood Glucose: No results found for this or any previous visit (from the past 24 hour(s)).    Social History   Tobacco Use  Smoking Status Former Smoker  . Types: Cigarettes  . Quit date: 04/02/1959  . Years since quitting: 61.5  Smokeless Tobacco Never Used    Goals Met:  Independence with exercise equipment Exercise tolerated well No report of cardiac concerns or symptoms Strength training completed today  Goals Unmet:  Not Applicable  Comments: Check out 1545.   Dr. Kathie Dike is Medical Director for [facility] Pulmonary Rehab.

## 2020-09-28 ENCOUNTER — Other Ambulatory Visit: Payer: Medicare Other

## 2020-09-28 ENCOUNTER — Encounter (HOSPITAL_COMMUNITY)
Admission: RE | Admit: 2020-09-28 | Discharge: 2020-09-28 | Disposition: A | Discharge status: Home / Self Care | Payer: Medicare Other | Source: Ambulatory Visit | Attending: Internal Medicine | Admitting: Internal Medicine

## 2020-09-28 ENCOUNTER — Other Ambulatory Visit: Payer: Self-pay

## 2020-09-28 DIAGNOSIS — I5033 Acute on chronic diastolic (congestive) heart failure: Secondary | ICD-10-CM

## 2020-09-28 DIAGNOSIS — Z955 Presence of coronary angioplasty implant and graft: Secondary | ICD-10-CM | POA: Diagnosis not present

## 2020-09-28 DIAGNOSIS — I2121 ST elevation (STEMI) myocardial infarction involving left circumflex coronary artery: Secondary | ICD-10-CM

## 2020-09-28 NOTE — Progress Notes (Signed)
Cardiac Individual Treatment Plan  Patient Details  Name: Ryan Garner MRN: [id number] Date of Birth: August 31, 1945 Referring Provider:   Flowsheet Row CARDIAC REHAB PHASE II ORIENTATION from 06/08/2020 in [place]  Referring Provider Dr. Harrington Challenger      Initial Encounter Date:  Flowsheet Row CARDIAC REHAB PHASE II ORIENTATION from 06/08/2020 in [place]  Date 06/08/20      Visit Diagnosis: S/P coronary artery stent placement  ST elevation myocardial infarction involving left circumflex coronary artery ([place])  Patient's Home Medications on Admission:  Current Outpatient Medications:  .  apixaban (ELIQUIS) 5 MG TABS tablet, Take 1 tablet (5 mg total) by mouth 2 (two) times daily., Disp: 28 tablet, Rfl: 0 .  atorvastatin (LIPITOR) 80 MG tablet, Take 1 tablet (80 mg total) by mouth daily., Disp: 90 tablet, Rfl: 1 .  cefdinir (OMNICEF) 300 MG capsule, Take 1 capsule (300 mg total) by mouth 2 (two) times daily. 1 po BID, Disp: 20 capsule, Rfl: 0 .  clopidogrel (PLAVIX) 75 MG tablet, Take 1 tablet (75 mg total) by mouth daily with breakfast., Disp: 90 tablet, Rfl: 2 .  fluticasone (FLONASE) 50 MCG/ACT nasal spray, Place 2 sprays into both nostrils daily., Disp: 16 g, Rfl: 6 .  furosemide (LASIX) 40 MG tablet, Take 2 tablets (80 mg total) by mouth daily., Disp: 60 tablet, Rfl: 1 .  metolazone (ZAROXOLYN) 5 MG tablet, Take 1 tablet (5 mg total) by mouth as needed (dypsnea or weight up 3 lbs)., Disp: 30 tablet, Rfl: 1 .  metoprolol tartrate (LOPRESSOR) 25 MG tablet, Take 1.5 tablets (37.5 mg total) by mouth 2 (two) times daily., Disp: 90 tablet, Rfl: 6 .  Misc Natural Products (PROSTATE SUPPORT) 300-15 MG TABS, Take 1 tablet by mouth daily. Super Beta Prostate, Disp: , Rfl:  .  Multiple Vitamin (MULTIVITAMIN WITH MINERALS) TABS tablet, Take 1 tablet by mouth daily., Disp: , Rfl:  .  nitroGLYCERIN (NITROSTAT) 0.4 MG SL tablet, Place 1 tablet (0.4 mg  total) under the tongue every 5 (five) minutes x 3 doses as needed for chest pain., Disp: 25 tablet, Rfl: 2 .  potassium chloride SA (KLOR-CON) 20 MEQ tablet, Take 1 tablet (20 mEq total) by mouth daily., Disp: 30 tablet, Rfl: 1 .  predniSONE (DELTASONE) 20 MG tablet, Take 3 tabs daily for 1 week, then 2 tabs daily for week 2, then 1 tab daily for week 3., Disp: 42 tablet, Rfl: 0  Past Medical History: Past Medical History:  Diagnosis Date  . A-fib ([place])   . Acute on chronic diastolic CHF (congestive heart failure) ([place]) 07/21/2020  . Hyperlipidemia   . Hypertension   . Metabolic syndrome   . Prediabetes   . STEMI (ST elevation myocardial infarction) ([place])     Tobacco Use: Social History   Tobacco Use  Smoking Status Former Smoker  . Types: Cigarettes  . Quit date: 04/02/1959  . Years since quitting: 61.5  Smokeless Tobacco Never Used    Labs: Recent Chemical engineer    Labs for ITP Cardiac and Pulmonary Rehab Latest Ref Rng & Units 09/04/2018 03/01/2020 04/30/2020 05/01/2020 07/21/2020   Cholestrol 0 - 200 mg/dL 166 - 123 122 -   LDLCALC 0 - 99 mg/dL 108(H) - 78 79 -   HDL >40 mg/dL 42 - 27(L) 30(L) -   Trlycerides <150 mg/dL 82 - 88 63 -   Hemoglobin A1c 4.8 - 5.6 % 5.8 6.0(H) 6.2(H) - 6.5(H)  TCO2 22 - 32 mmol/L - - 21(L) - -      Capillary Blood Glucose: Lab Results  Component Value Date   GLUCAP 119 (H) 07/24/2020   GLUCAP 136 (H) 07/22/2020   GLUCAP 153 (H) 07/22/2020   GLUCAP 86 07/22/2020   GLUCAP 145 (H) 07/21/2020     Exercise Target Goals: Exercise Program Goal: Individual exercise prescription set using results from initial 6 min walk test and THRR while considering  patient's activity barriers and safety.   Exercise Prescription Goal: Starting with aerobic activity 30 plus minutes a day, 3 days per week for initial exercise prescription. Provide home exercise prescription and guidelines that participant acknowledges understanding prior to  discharge.  Activity Barriers & Risk Stratification:  Activity Barriers & Cardiac Risk Stratification - 07/05/20 1215      Activity Barriers & Cardiac Risk Stratification   Activity Barriers Arthritis;Back Problems;Deconditioning;Joint Problems;Balance Concerns;History of Falls    Cardiac Risk Stratification High           6 Minute Walk:  6 Minute Walk    Row Name 06/08/20 1408         6 Minute Walk   Phase Initial     Distance 700 feet     Walk Time 6 minutes     # of Rest Breaks 2     MPH 1.33     METS 0.31     RPE 15     Perceived Dyspnea  15     VO2 Peak 1.07     Symptoms Yes (comment)     Comments In afib during the test. Took 2 standing rest breaks for 45 and 30 seconds due to SOB     Resting HR 74 bpm     Resting BP 100/70     Resting Oxygen Saturation  97 %     Exercise Oxygen Saturation  during 6 min walk 97 %     Max Ex. HR 99 bpm     Max Ex. BP 112/64     2 Minute Post BP 102/62            Oxygen Initial Assessment:   Oxygen Re-Evaluation:   Oxygen Discharge (Final Oxygen Re-Evaluation):   Initial Exercise Prescription:  Initial Exercise Prescription - 06/08/20 1400      Date of Initial Exercise RX and Referring Provider   Date 06/08/20    Referring Provider Dr. Harrington Challenger    Expected Discharge Date 09/16/20      NuStep   Level 1    SPM 60    Minutes 22      Arm Ergometer   Level 1    RPM 60    Minutes 17      Prescription Details   Frequency (times per week) 3    Duration Progress to 30 minutes of continuous aerobic without signs/symptoms of physical distress      Intensity   THRR 40-80% of Max Heartrate 58-115    Ratings of Perceived Exertion 11-13    Perceived Dyspnea 0-4      Progression   Progression Continue progressive overload as per policy without signs/symptoms or physical distress.      Resistance Training   Training Prescription Yes    Weight 3 lbs    Reps 10-15           Perform Capillary Blood Glucose checks  as needed.  Exercise Prescription Changes:   Exercise Prescription Changes    Row Name 07/01/20 [phone]  07/18/20 1445 08/30/20 0800 09/02/20 1500 09/12/20 0818     Response to Exercise   Blood Pressure (Admit) 128/70 128/80 140/86 -- 116/78   Blood Pressure (Exercise) 118/70 134/88 178/68 -- 152/66   Blood Pressure (Exit) 112/76 118/86 104/64 -- 98/70   Heart Rate (Admit) 91 bpm 72 bpm 100 bpm -- 80 bpm   Heart Rate (Exercise) 110 bpm 100 bpm 110 bpm -- 108 bpm   Heart Rate (Exit) 96 bpm 77 bpm 102 bpm -- 88 bpm   Rating of Perceived Exertion (Exercise) 12 12 11  -- 12   Duration Continue with 30 min of aerobic exercise without signs/symptoms of physical distress. Continue with 30 min of aerobic exercise without signs/symptoms of physical distress. Continue with 30 min of aerobic exercise without signs/symptoms of physical distress. -- Continue with 30 min of aerobic exercise without signs/symptoms of physical distress.   Intensity THRR unchanged THRR unchanged THRR unchanged -- THRR unchanged     Progression   Progression Continue to progress workloads to maintain intensity without signs/symptoms of physical distress. Continue to progress workloads to maintain intensity without signs/symptoms of physical distress. Continue to progress workloads to maintain intensity without signs/symptoms of physical distress. -- Continue to progress workloads to maintain intensity without signs/symptoms of physical distress.     Resistance Training   Training Prescription Yes Yes Yes -- Yes   Weight 3 lbs 3 lbs 3 lbs -- 5 lbs   Reps 10-15 10-15 10-15 -- 10-15   Time -- 10 Minutes 10 Minutes -- 10 Minutes     NuStep   Level 1 1 1  -- 3   SPM 83 84 97 -- 102   Minutes 22 22 22  -- 17   METs 2.1 2 2.1 -- 2.2     Arm Ergometer   Level 1 1 1  -- 3   RPM 48 47 55 -- 44   Minutes 17 17 17  -- 22   METs 1.4 1.4 1.5 -- 1.7     Home Exercise Plan   Plans to continue exercise at -- -- -- Home (comment) --    Frequency -- -- -- Add 2 additional days to program exercise sessions. --   Initial Home Exercises Provided -- -- -- 09/02/20 --   [place] Name 09/26/20 1545             Response to Exercise   Blood Pressure (Admit) 112/80       Blood Pressure (Exercise) 110/80       Blood Pressure (Exit) 112/70       Heart Rate (Admit) 64 bpm       Heart Rate (Exercise) 100 bpm       Heart Rate (Exit) 71 bpm       Rating of Perceived Exertion (Exercise) 12       Duration Continue with 30 min of aerobic exercise without signs/symptoms of physical distress.       Intensity THRR unchanged               Progression   Progression Continue to progress workloads to maintain intensity without signs/symptoms of physical distress.               Resistance Training   Training Prescription Yes       Weight 5lbs       Reps 10-15       Time 10 Minutes               NuStep  Level 5       SPM 87       Minutes 17       METs 2.4               Arm Ergometer   Level 5       RPM 36       Minutes 22       METs 2              Exercise Comments:   Exercise Comments    Row Name 06/27/20 1542 09/02/20 1551         Exercise Comments Pt completed first exercise session. He tolerated exercise well and is excited to come back. Reviewed home exercise plan with patient. He is currently walking a little at home. We discussed increasing his walking time to 20-30 minutes per day.             Exercise Goals and Review:   Exercise Goals    Row Name 06/08/20 1413 07/01/20 0743 08/01/20 1407 09/26/20 1545       Exercise Goals   Increase Physical Activity Yes Yes Yes Yes    Intervention Provide advice, education, support and counseling about physical activity/exercise needs.;Develop an individualized exercise prescription for aerobic and resistive training based on initial evaluation findings, risk stratification, comorbidities and participant's personal goals. Provide advice, education, support and  counseling about physical activity/exercise needs.;Develop an individualized exercise prescription for aerobic and resistive training based on initial evaluation findings, risk stratification, comorbidities and participant's personal goals. Provide advice, education, support and counseling about physical activity/exercise needs.;Develop an individualized exercise prescription for aerobic and resistive training based on initial evaluation findings, risk stratification, comorbidities and participant's personal goals. Provide advice, education, support and counseling about physical activity/exercise needs.;Develop an individualized exercise prescription for aerobic and resistive training based on initial evaluation findings, risk stratification, comorbidities and participant's personal goals.    Expected Outcomes Short Term: Attend rehab on a regular basis to increase amount of physical activity.;Long Term: Add in home exercise to make exercise part of routine and to increase amount of physical activity.;Long Term: Exercising regularly at least 3-5 days a week. Short Term: Attend rehab on a regular basis to increase amount of physical activity.;Long Term: Add in home exercise to make exercise part of routine and to increase amount of physical activity.;Long Term: Exercising regularly at least 3-5 days a week. Short Term: Attend rehab on a regular basis to increase amount of physical activity.;Long Term: Add in home exercise to make exercise part of routine and to increase amount of physical activity.;Long Term: Exercising regularly at least 3-5 days a week. Short Term: Attend rehab on a regular basis to increase amount of physical activity.;Long Term: Add in home exercise to make exercise part of routine and to increase amount of physical activity.;Long Term: Exercising regularly at least 3-5 days a week.    Increase Strength and Stamina Yes Yes Yes Yes    Intervention Provide advice, education, support and  counseling about physical activity/exercise needs.;Develop an individualized exercise prescription for aerobic and resistive training based on initial evaluation findings, risk stratification, comorbidities and participant's personal goals. Provide advice, education, support and counseling about physical activity/exercise needs.;Develop an individualized exercise prescription for aerobic and resistive training based on initial evaluation findings, risk stratification, comorbidities and participant's personal goals. Provide advice, education, support and counseling about physical activity/exercise needs.;Develop an individualized exercise prescription for aerobic and resistive training based on initial evaluation findings, risk stratification,  comorbidities and participant's personal goals. Provide advice, education, support and counseling about physical activity/exercise needs.;Develop an individualized exercise prescription for aerobic and resistive training based on initial evaluation findings, risk stratification, comorbidities and participant's personal goals.    Expected Outcomes Short Term: Increase workloads from initial exercise prescription for resistance, speed, and METs.;Short Term: Perform resistance training exercises routinely during rehab and add in resistance training at home;Long Term: Improve cardiorespiratory fitness, muscular endurance and strength as measured by increased METs and functional capacity (6MWT) Short Term: Increase workloads from initial exercise prescription for resistance, speed, and METs.;Short Term: Perform resistance training exercises routinely during rehab and add in resistance training at home;Long Term: Improve cardiorespiratory fitness, muscular endurance and strength as measured by increased METs and functional capacity (6MWT) Short Term: Increase workloads from initial exercise prescription for resistance, speed, and METs.;Short Term: Perform resistance training  exercises routinely during rehab and add in resistance training at home;Long Term: Improve cardiorespiratory fitness, muscular endurance and strength as measured by increased METs and functional capacity (6MWT) Short Term: Increase workloads from initial exercise prescription for resistance, speed, and METs.;Short Term: Perform resistance training exercises routinely during rehab and add in resistance training at home;Long Term: Improve cardiorespiratory fitness, muscular endurance and strength as measured by increased METs and functional capacity (6MWT)    Able to understand and use rate of perceived exertion (RPE) scale Yes Yes Yes Yes    Intervention Provide education and explanation on how to use RPE scale Provide education and explanation on how to use RPE scale Provide education and explanation on how to use RPE scale Provide education and explanation on how to use RPE scale    Expected Outcomes Short Term: Able to use RPE daily in rehab to express subjective intensity level;Long Term:  Able to use RPE to guide intensity level when exercising independently Short Term: Able to use RPE daily in rehab to express subjective intensity level;Long Term:  Able to use RPE to guide intensity level when exercising independently Short Term: Able to use RPE daily in rehab to express subjective intensity level;Long Term:  Able to use RPE to guide intensity level when exercising independently Short Term: Able to use RPE daily in rehab to express subjective intensity level;Long Term:  Able to use RPE to guide intensity level when exercising independently    Knowledge and understanding of Target Heart Rate Range (THRR) Yes Yes Yes Yes    Intervention Provide education and explanation of THRR including how the numbers were predicted and where they are located for reference Provide education and explanation of THRR including how the numbers were predicted and where they are located for reference Provide education and  explanation of THRR including how the numbers were predicted and where they are located for reference Provide education and explanation of THRR including how the numbers were predicted and where they are located for reference    Expected Outcomes Short Term: Able to state/look up THRR;Long Term: Able to use THRR to govern intensity when exercising independently;Short Term: Able to use daily as guideline for intensity in rehab Short Term: Able to state/look up THRR;Long Term: Able to use THRR to govern intensity when exercising independently;Short Term: Able to use daily as guideline for intensity in rehab Short Term: Able to state/look up THRR;Long Term: Able to use THRR to govern intensity when exercising independently;Short Term: Able to use daily as guideline for intensity in rehab Short Term: Able to state/look up THRR;Long Term: Able to use THRR to govern  intensity when exercising independently;Short Term: Able to use daily as guideline for intensity in rehab    Able to check pulse independently Yes Yes Yes Yes    Intervention Provide education and demonstration on how to check pulse in carotid and radial arteries.;Review the importance of being able to check your own pulse for safety during independent exercise Review the importance of being able to check your own pulse for safety during independent exercise;Provide education and demonstration on how to check pulse in carotid and radial arteries. Review the importance of being able to check your own pulse for safety during independent exercise;Provide education and demonstration on how to check pulse in carotid and radial arteries. Review the importance of being able to check your own pulse for safety during independent exercise;Provide education and demonstration on how to check pulse in carotid and radial arteries.    Expected Outcomes Long Term: Able to check pulse independently and accurately;Short Term: Able to explain why pulse checking is important  during independent exercise Short Term: Able to explain why pulse checking is important during independent exercise;Long Term: Able to check pulse independently and accurately Short Term: Able to explain why pulse checking is important during independent exercise;Long Term: Able to check pulse independently and accurately Short Term: Able to explain why pulse checking is important during independent exercise;Long Term: Able to check pulse independently and accurately    Understanding of Exercise Prescription Yes Yes Yes Yes    Intervention Provide education, explanation, and written materials on patient's individual exercise prescription Provide education, explanation, and written materials on patient's individual exercise prescription Provide education, explanation, and written materials on patient's individual exercise prescription Provide education, explanation, and written materials on patient's individual exercise prescription    Expected Outcomes Short Term: Able to explain program exercise prescription;Long Term: Able to explain home exercise prescription to exercise independently Short Term: Able to explain program exercise prescription;Long Term: Able to explain home exercise prescription to exercise independently Short Term: Able to explain program exercise prescription;Long Term: Able to explain home exercise prescription to exercise independently Short Term: Able to explain program exercise prescription;Long Term: Able to explain home exercise prescription to exercise independently           Exercise Goals Re-Evaluation :  Exercise Goals Re-Evaluation    Row Name 07/01/20 0744 08/01/20 1407 08/30/20 0808 09/26/20 1545       Exercise Goal Re-Evaluation   Exercise Goals Review Increase Physical Activity;Increase Strength and Stamina;Able to understand and use rate of perceived exertion (RPE) scale;Knowledge and understanding of Target Heart Rate Range (THRR);Able to check pulse  independently;Understanding of Exercise Prescription Increase Physical Activity;Increase Strength and Stamina;Able to understand and use rate of perceived exertion (RPE) scale;Knowledge and understanding of Target Heart Rate Range (THRR);Able to check pulse independently;Understanding of Exercise Prescription Increase Physical Activity;Increase Strength and Stamina;Able to understand and use rate of perceived exertion (RPE) scale;Knowledge and understanding of Target Heart Rate Range (THRR);Able to check pulse independently;Understanding of Exercise Prescription Increase Physical Activity;Increase Strength and Stamina;Able to understand and use rate of perceived exertion (RPE) scale;Knowledge and understanding of Target Heart Rate Range (THRR);Able to check pulse independently;Understanding of Exercise Prescription    Comments Patient has completed 1 exerise session. He has tolerated exercise well. He is eager to come to rehab and has a positive attitude. He is currently exercising at 2.1 METs on the NuStep. Will continue to monitor and progress exercise as able. Pt has completed 5 exercise sessions. He has been frequently absent,  so he has not progressed. He was recently hospitialized due to being fluid overloaded. While in the hospitial they were able to get over 20 lbs of fluid out of him. He has a follow up appointment with his cardiologist 08/08/2020 to be cleared to return to exercise. He eats out a lot and cosumes a lot of salt in his diet which greatly contributes to his fluid retention. He was exercising at 2.0 METs on the stepper before he was hospitialized. Will continue to monitor and progress as able. Patient has completed 8 exercise sessions. He was out for about a month due to fluid build up, so he is starting to get back into it. He has been back for about a week and is progressing. He is currently exercising at 2.1 METs on the NuStep. Patient has completed 19 exercise sessions. He has been  progressing very well and feels he is getting stronger. He is increasing workloads and tolerating it well. He is always enthusiastic about coming to rehab. He is exercising at home and says that is going well. He is currently exercising at 2.4 METs on the NuStep. Will continue to monitor and progress.    Expected Outcomes Through exercise at rehab and with a home exercise plan, patient will achieve their goals. Through exercise at rehab and with a home exercise plan, patient will achieve their goals. Through exercise at rehab and a home exercise plan, patient will reach their goals. Through exercise at rehab and a home exercise plan, patient will reach their goals.            Discharge Exercise Prescription (Final Exercise Prescription Changes):  Exercise Prescription Changes - 09/26/20 1545      Response to Exercise   Blood Pressure (Admit) 112/80    Blood Pressure (Exercise) 110/80    Blood Pressure (Exit) 112/70    Heart Rate (Admit) 64 bpm    Heart Rate (Exercise) 100 bpm    Heart Rate (Exit) 71 bpm    Rating of Perceived Exertion (Exercise) 12    Duration Continue with 30 min of aerobic exercise without signs/symptoms of physical distress.    Intensity THRR unchanged      Progression   Progression Continue to progress workloads to maintain intensity without signs/symptoms of physical distress.      Resistance Training   Training Prescription Yes    Weight 5lbs    Reps 10-15    Time 10 Minutes      NuStep   Level 5    SPM 87    Minutes 17    METs 2.4      Arm Ergometer   Level 5    RPM 36    Minutes 22    METs 2           Nutrition:  Target Goals: Understanding of nutrition guidelines, daily intake of sodium 1500mg , cholesterol 200mg , calories 30% from fat and 7% or less from saturated fats, daily to have 5 or more servings of fruits and vegetables.  Biometrics:  Pre Biometrics - 09/26/20 1545      Pre Biometrics   Weight 129.4 kg    BMI (Calculated) 43.39             Nutrition Therapy Plan and Nutrition Goals:  Nutrition Therapy & Goals - 09/19/20 1017      Personal Nutrition Goals   Comments We will continue to provide heart healthy nutritional education through hand-outs.      Intervention Plan  Intervention Nutrition handout(s) given to patient.           Nutrition Assessments:  Nutrition Assessments - 06/08/20 1405      MEDFICTS Scores   Pre Score 41          MEDIFICTS Score Key:  ?70 Need to make dietary changes   40-70 Heart Healthy Diet  ? 40 Therapeutic Level Cholesterol Diet   Picture Your Plate Scores:  <41 Unhealthy dietary pattern with much room for improvement.  41-50 Dietary pattern unlikely to meet recommendations for good health and room for improvement.  51-60 More healthful dietary pattern, with some room for improvement.   >60 Healthy dietary pattern, although there may be some specific behaviors that could be improved.    Nutrition Goals Re-Evaluation:   Nutrition Goals Discharge (Final Nutrition Goals Re-Evaluation):   Psychosocial: Target Goals: Acknowledge presence or absence of significant depression and/or stress, maximize coping skills, provide positive support system. Participant is able to verbalize types and ability to use techniques and skills needed for reducing stress and depression.  Initial Review & Psychosocial Screening:  Initial Psych Review & Screening - 06/08/20 1409      Initial Review   Current issues with None Identified      Family Dynamics   Good Support System? Yes    Concerns Recent loss of child    Comments Patient lives with his wife of 48 years. He lost his daughter to [place] in September 2021. He says he and his wife are dealing with their grief through family and friends and their church family. He says his faith in God is getting him through and that he knows he will see his daughter again. He also has a son who lives in [place] and has several  grand-children. He denies any depression or anxiety but does acknowlege his grief. Will continue to monitor.      Barriers   Psychosocial barriers to participate in program Psychosocial barriers identified (see note)           Quality of Life Scores:  Quality of Life - 06/08/20 1407      Quality of Life   Select Quality of Life      Quality of Life Scores   Health/Function Pre 19.03 %    Socioeconomic Pre 27.36 %    Psych/Spiritual Pre 27.86 %    Family Pre 25.2 %    GLOBAL Pre 23.47 %          Scores of 19 and below usually indicate a poorer quality of life in these areas.  A difference of  2-3 points is a clinically meaningful difference.  A difference of 2-3 points in the total score of the Quality of Life Index has been associated with significant improvement in overall quality of life, self-image, physical symptoms, and general health in studies assessing change in quality of life.  PHQ-9: Recent Review Flowsheet Data    Depression screen [facility] 2/9 09/15/2020 06/30/2020 06/08/2020 05/10/2020 04/14/2020   Decreased Interest 0 0 0 0 0   Down, Depressed, Hopeless 0 0 0 0 1   PHQ - 2 Score 0 0 0 0 1   Altered sleeping - - 0 - -   Tired, decreased energy - - 3 - -   Change in appetite - - 0 - -   Feeling bad or failure about yourself  - - 1 - -   Trouble concentrating - - 0 - -   Moving slowly  or fidgety/restless - - 0 - -   Suicidal thoughts - - 0 - -   PHQ-9 Score - - 4 - -   Difficult doing work/chores - - Somewhat difficult - -     Interpretation of Total Score  Total Score Depression Severity:  1-4 = Minimal depression, 5-9 = Mild depression, 10-14 = Moderate depression, 15-19 = Moderately severe depression, 20-27 = Severe depression   Psychosocial Evaluation and Intervention:  Psychosocial Evaluation - 06/08/20 1412      Psychosocial Evaluation & Interventions   Interventions Encouraged to exercise with the program and follow exercise prescription;Stress  management education;Relaxation education    Comments Patient has no psychosocial barriers identified at his orientation visit. His initial QOL score was 23.47% and his PHQ-9 score was 4.    Expected Outcomes Patient will have no psychosocial issues identified at [place].    Continue Psychosocial Services  No Follow up required           Psychosocial Re-Evaluation:  Psychosocial Re-Evaluation    [place] Name 07/01/20 0800 07/27/20 1126 08/24/20 0739 09/19/20 1017       Psychosocial Re-Evaluation   Current issues with None Identified -- None Identified None Identified    Comments Patient is new to the program but continues to have no psychosocial issues identified. He continues to deal with the loss of his daughter but does continue to demonstrate a positive outlook. Will continue to monitor. Patient continues to have no psychosocial issues identified completing 6 sessions. He has recently been hospitalized with CHF exacerbation. Was discharged 07/25/20. He has been doing well in the program demonstrating a positive attitude. Will continue to monitor. Patient continues to have no psychosocial issues identified completing 7 sessions. He has been absent for 4 weeks after hospitalization with volumn overload. He has been cleared by Dr. Harrington Challenger to return to the program. He says he is glad to be back.  He has been doing well in the program demonstrating a positive attitude. Will continue to monitor. Patient continues to have no psychosocial issues identified completing 16 sessions. He has been doing well in the program demonstrating a positive attitude. He is very interactive with staff and others in his class. Will continue to monitor.    Expected Outcomes Patient will have no psychosocial issues identified at discharge. Patient will have no psychosocial issues identified at discharge. Patient will have no psychosocial issues identified at discharge. Patient will have no psychosocial issues identified at  discharge.    Interventions Stress management education;Encouraged to attend Cardiac Rehabilitation for the exercise;Relaxation education Stress management education;Encouraged to attend Cardiac Rehabilitation for the exercise;Relaxation education Stress management education;Encouraged to attend Cardiac Rehabilitation for the exercise;Relaxation education Stress management education;Encouraged to attend Cardiac Rehabilitation for the exercise;Relaxation education    Continue Psychosocial Services  No Follow up required No Follow up required No Follow up required No Follow up required           Psychosocial Discharge (Final Psychosocial Re-Evaluation):  Psychosocial Re-Evaluation - 09/19/20 1017      Psychosocial Re-Evaluation   Current issues with None Identified    Comments Patient continues to have no psychosocial issues identified completing 16 sessions. He has been doing well in the program demonstrating a positive attitude. He is very interactive with staff and others in his class. Will continue to monitor.    Expected Outcomes Patient will have no psychosocial issues identified at discharge.    Interventions Stress management education;Encouraged to attend Cardiac Rehabilitation for  the exercise;Relaxation education    Continue Psychosocial Services  No Follow up required           Vocational Rehabilitation: Provide vocational rehab assistance to qualifying candidates.   Vocational Rehab Evaluation & Intervention:  Vocational Rehab - 06/08/20 1406      Initial Vocational Rehab Evaluation & Intervention   Assessment shows need for Vocational Rehabilitation No      Vocational Rehab Re-Evaulation   Comments Patient is a retired Company secretary. He is not interested in returning to work and does not need vocational rehab.           Education: Education Goals: Education classes will be provided on a weekly basis, covering required topics. Participant will state understanding/return  demonstration of topics presented.  Learning Barriers/Preferences:  Learning Barriers/Preferences - 06/08/20 1405      Learning Barriers/Preferences   Learning Barriers None    Learning Preferences Written Material;Video;Skilled Demonstration;Verbal Instruction;Audio           Education Topics: Hypertension, Hypertension Reduction -Define heart disease and high blood pressure. Discus how high blood pressure affects the body and ways to reduce high blood pressure.   Exercise and Your Heart -Discuss why it is important to exercise, the FITT principles of exercise, normal and abnormal responses to exercise, and how to exercise safely.   Angina -Discuss definition of angina, causes of angina, treatment of angina, and how to decrease risk of having angina.   Cardiac Medications -Review what the following cardiac medications are used for, how they affect the body, and side effects that may occur when taking the medications.  Medications include Aspirin, Beta blockers, calcium channel blockers, ACE Inhibitors, angiotensin receptor blockers, diuretics, digoxin, and antihyperlipidemics.   Congestive Heart Failure -Discuss the definition of CHF, how to live with CHF, the signs and symptoms of CHF, and how keep track of weight and sodium intake. Flowsheet Row CARDIAC REHAB PHASE II EXERCISE from 09/21/2020 in [place]  Date 08/24/20  Educator mk  Instruction Review Code 2- Demonstrated Understanding      Heart Disease and Intimacy -Discus the effect sexual activity has on the heart, how changes occur during intimacy as we age, and safety during sexual activity.   Smoking Cessation / COPD -Discuss different methods to quit smoking, the health benefits of quitting smoking, and the definition of COPD. Flowsheet Row CARDIAC REHAB PHASE II EXERCISE from 09/21/2020 in [place]  Date 09/07/20  Educator DF  Instruction Review Code 2-  Demonstrated Understanding      Nutrition I: Fats -Discuss the types of cholesterol, what cholesterol does to the heart, and how cholesterol levels can be controlled. Flowsheet Row CARDIAC REHAB PHASE II EXERCISE from 09/21/2020 in [place]  Date 09/14/20  Educator mk  Instruction Review Code 2- Demonstrated Understanding      Nutrition II: Labels -Discuss the different components of food labels and how to read food label [place] from 09/21/2020 in [place]  Date 09/21/20  Educator DJ  Instruction Review Code 1- Verbalizes Understanding      Heart Parts/Heart Disease and PAD -Discuss the anatomy of the heart, the pathway of blood circulation through the heart, and these are affected by heart disease.   Stress I: Signs and Symptoms -Discuss the causes of stress, how stress may lead to anxiety and depression, and ways to limit stress.   Stress II: Relaxation -Discuss different types of relaxation techniques  to limit stress.   Warning Signs of Stroke / TIA -Discuss definition of a stroke, what the signs and symptoms are of a stroke, and how to identify when someone is having stroke.   Knowledge Questionnaire Score:  Knowledge Questionnaire Score - 06/08/20 1406      Knowledge Questionnaire Score   Pre Score 18/24           Core Components/Risk Factors/Patient Goals at Admission:  Personal Goals and Risk Factors at Admission - 06/08/20 1406      Core Components/Risk Factors/Patient Goals on Admission    Weight Management Obesity;Yes    Intervention Weight Management: Develop a combined nutrition and exercise program designed to reach desired caloric intake, while maintaining appropriate intake of nutrient and fiber, sodium and fats, and appropriate energy expenditure required for the weight goal.;Weight Management: Provide education and appropriate resources to help participant work on  and attain dietary goals.;Weight Management/Obesity: Establish reasonable short term and long term weight goals.;Obesity: Provide education and appropriate resources to help participant work on and attain dietary goals.    Admit Weight 303 lb (137.4 kg)    Goal Weight: Short Term 298 lb (135.2 kg)    Goal Weight: Long Term 293 lb (132.9 kg)    Personal Goal Other Yes    Personal Goal Get stronger; get back to doing his ADL's. Lose more weight: 10 lbs in the program and more long term.    Intervention Patient will attend CR 3 days/week and supplement with exercise at home 3 days/week.    Expected Outcomes Patient will meet both personal and program goals.           Core Components/Risk Factors/Patient Goals Review:   Goals and Risk Factor Review    Row Name 07/01/20 0802 07/27/20 1521 08/24/20 0741 09/19/20 1018       Core Components/Risk Factors/Patient Goals Review   Personal Goals Review Weight Management/Obesity Weight Management/Obesity Weight Management/Obesity Weight Management/Obesity    Review Patient is new the the program completing 3 sessions. He has multiple risk factors for CAD and is doing the program for risk modification. His personal goals are to get stronger; be able to do his ADL's and lose 10 lbs in the program and more long-term. We will cotinue to monitor as he works toward meeting both personal and program goals. Patient has completed 6 sessions gaining 10 lbs since his initial visit. His personal goals are to get stronger; be able to do his ADL's; lose 10 lbs in the praogram and more long-term. His pcp referred him to cardiology with SOB. He saw Dr. Harrington Challenger 07/20/20 and she admitted him to the hospital with volume overload for diuresis. He was discharged 07/25/20. He has a follow up visit with Dr. Harrington Challenger 1/17. She does not want him to start back CR until his f/u visit. Patient has been notified. Will continue to monitor. Patient has completed 7 sessions. He lost 16 lbs since he  came a month ago after his ER admission and treatment for volume overload. He has been cleared to return to the program by Dr. Harrington Challenger. He saw her for his follow up visit 08/05/20. She added Metolazone to take 30 minutes prior to taking his lasix. He last A1C was 07/21/20 at 6.5% which is trending up from 5 months ago at 6%. His personal goals for the program are to get stronger; be able to do his ADL's; lose 10 lbs in the program and more long term. We will continue to  monitor for progress as he works toward meeting these goals. Patient has completed 16 sessions gaining a lb since last 30 day review. He is doing well in the program with consistent attendance. He has a BNP 2/10 which continues to be elevated. Dr. Harrington Challenger increased his Lasix to 80 mg BID with repeat BNP 3/9. His most recent A1C was 07/21/20 at 6.5% which is trending up. He controlled DM with diet. His personal goals for the program are to get stronger; be able to do his ADL's; lose 10 lbs in the program and more long term. Will continue to monitor as he works toward meeting these goal.s    Expected Outcomes Patient will complete the program meeting both personal and program goals. Patient will complete the program meeting both personal and program goals. Patient will complete the program meeting both personal and program goals. Patient will complete the program meeting both personal and program goals.           Core Components/Risk Factors/Patient Goals at Discharge (Final Review):   Goals and Risk Factor Review - 09/19/20 1018      Core Components/Risk Factors/Patient Goals Review   Personal Goals Review Weight Management/Obesity    Review Patient has completed 16 sessions gaining a lb since last 30 day review. He is doing well in the program with consistent attendance. He has a BNP 2/10 which continues to be elevated. Dr. Harrington Challenger increased his Lasix to 80 mg BID with repeat BNP 3/9. His most recent A1C was 07/21/20 at 6.5% which is trending up.  He controlled DM with diet. His personal goals for the program are to get stronger; be able to do his ADL's; lose 10 lbs in the program and more long term. Will continue to monitor as he works toward meeting these goal.s    Expected Outcomes Patient will complete the program meeting both personal and program goals.           ITP Comments:   Comments: ITP REVIEW Pt is making expected progress toward Cardiac Rehab goals after completing 16 sessions. Recommend continued exercise, life style modification, education, and increased stamina and strength.

## 2020-09-28 NOTE — Progress Notes (Signed)
Daily Session Note  Patient Details  Name: Ryan Garner MRN: [id number] Date of Birth: 1945/12/26 Referring Provider:   Flowsheet Row CARDIAC REHAB PHASE II ORIENTATION from 06/08/2020 in [place]  Referring Provider Dr. Harrington Challenger      Encounter Date: 09/28/2020  Check In:  Session Check In - 09/28/20 1445      Check-In   Supervising physician immediately available to respond to emergencies [facility] MD immediately available    Physician(s) Dr. Domenic Polite    Location [facility]    Staff Present Aundra Dubin, RN, BSN;Madison Audria Nine, MS, Exercise Physiologist    Virtual Visit No    Medication changes reported     No    Fall or balance concerns reported    Yes    Tobacco Cessation No Change    Warm-up and Cool-down Performed as group-led instruction    Resistance Training Performed Yes    VAD Patient? No    PAD/SET Patient? No      Pain Assessment   Currently in Pain? No/denies    Pain Score 0-No pain    Multiple Pain Sites No           Capillary Blood Glucose: No results found for this or any previous visit (from the past 24 hour(s)).    Social History   Tobacco Use  Smoking Status Former Smoker  . Types: Cigarettes  . Quit date: 04/02/1959  . Years since quitting: 61.5  Smokeless Tobacco Never Used    Goals Met:  Independence with exercise equipment Exercise tolerated well No report of cardiac concerns or symptoms Strength training completed today  Goals Unmet:  Not Applicable  Comments: Check out 1545.   Dr. Kathie Dike is Medical Director for [facility] Pulmonary Rehab.

## 2020-09-29 LAB — BASIC METABOLIC PANEL
BUN/Creatinine Ratio: 31 — ABNORMAL HIGH (ref 10–24)
BUN: 35 mg/dL — ABNORMAL HIGH (ref 8–27)
CO2: 24 mmol/L (ref 20–29)
Calcium: 9.4 mg/dL (ref 8.6–10.2)
Chloride: 99 mmol/L (ref 96–106)
Creatinine, Ser: 1.13 mg/dL (ref 0.76–1.27)
Glucose: 123 mg/dL — ABNORMAL HIGH (ref 65–99)
Potassium: 4.2 mmol/L (ref 3.5–5.2)
Sodium: 138 mmol/L (ref 134–144)
eGFR: 67 mL/min/{1.73_m2} (ref >59)

## 2020-09-29 LAB — PRO B NATRIURETIC PEPTIDE: NT-Pro BNP: 2381 pg/mL — ABNORMAL HIGH (ref 0–486)

## 2020-09-30 ENCOUNTER — Other Ambulatory Visit: Payer: Self-pay

## 2020-09-30 ENCOUNTER — Telehealth: Payer: Self-pay | Admitting: *Deleted

## 2020-09-30 ENCOUNTER — Encounter (HOSPITAL_COMMUNITY)
Admission: RE | Admit: 2020-09-30 | Discharge: 2020-09-30 | Disposition: A | Discharge status: Home / Self Care | Payer: Medicare Other | Source: Ambulatory Visit | Attending: Internal Medicine | Admitting: Internal Medicine

## 2020-09-30 DIAGNOSIS — I2510 Atherosclerotic heart disease of native coronary artery without angina pectoris: Secondary | ICD-10-CM

## 2020-09-30 DIAGNOSIS — Z955 Presence of coronary angioplasty implant and graft: Secondary | ICD-10-CM

## 2020-09-30 DIAGNOSIS — I2121 ST elevation (STEMI) myocardial infarction involving left circumflex coronary artery: Secondary | ICD-10-CM

## 2020-09-30 NOTE — Telephone Encounter (Signed)
-----   Message from Fay Records, MD sent at 09/29/2020 12:24 PM EST ----- Fluid up still but better   Kidney function is OK I would continue on with 80 mg lasix daily  Watch  How is swelling in legs     If swelling increases the can try 80 am   40 pm  occasionally  (1-2x per week) CHeck BMET and BNP in 6 wks

## 2020-09-30 NOTE — Progress Notes (Signed)
Daily Session Note  Patient Details  Name: BIENVENIDO PROEHL MRN: [id number] Date of Birth: 05-11-1946 Referring Provider:   Flowsheet Row CARDIAC REHAB PHASE II ORIENTATION from 06/08/2020 in [place]  Referring Provider Dr. Harrington Challenger      Encounter Date: 09/30/2020  Check In:  Session Check In - 09/30/20 1445      Check-In   Supervising physician immediately available to respond to emergencies [facility] MD immediately available    Physician(s) Dr. Domenic Polite    Location [facility]    Staff Present Geanie Cooley, RN;Debra Wynetta Emery, RN, BSN    Virtual Visit No    Medication changes reported     No    Fall or balance concerns reported    No    Tobacco Cessation No Change    Warm-up and Cool-down Performed as group-led instruction    Resistance Training Performed Yes    VAD Patient? No    PAD/SET Patient? No      Pain Assessment   Currently in Pain? No/denies    Pain Score 0-No pain    Multiple Pain Sites No           Capillary Blood Glucose: No results found for this or any previous visit (from the past 24 hour(s)).    Social History   Tobacco Use  Smoking Status Former Smoker  . Types: Cigarettes  . Quit date: 04/02/1959  . Years since quitting: 61.5  Smokeless Tobacco Never Used    Goals Met:  Independence with exercise equipment Personal goals reviewed No report of cardiac concerns or symptoms Strength training completed today  Goals Unmet:  Not Applicable  Comments: check out @ 3:45   Dr. Kathie Dike is Medical Director for [facility].

## 2020-09-30 NOTE — Progress Notes (Signed)
Patient notified of result.  Please refer to phone note from today for complete details.   Rodman Key, RN 09/30/2020 11:27 AM

## 2020-09-30 NOTE — Telephone Encounter (Signed)
Spoke with patient. The swelling is improved on lasix 80 mg daily.  He take occas metolazone as ordered if weight is up 3 lbs or more overnight.   He took one this morning 30 min prior to the lasix.  He is aware that he can take an addition lasix 40 mg in pm 1-2 times per week if needed.  He will continue to monitor and call if any needs.  Will come for labs on 11/16/20.

## 2020-10-03 ENCOUNTER — Other Ambulatory Visit: Payer: Self-pay

## 2020-10-03 ENCOUNTER — Encounter (HOSPITAL_COMMUNITY)
Admission: RE | Admit: 2020-10-03 | Discharge: 2020-10-03 | Disposition: A | Discharge status: Home / Self Care | Payer: Medicare Other | Source: Ambulatory Visit | Attending: Internal Medicine | Admitting: Internal Medicine

## 2020-10-03 DIAGNOSIS — I2121 ST elevation (STEMI) myocardial infarction involving left circumflex coronary artery: Secondary | ICD-10-CM

## 2020-10-03 DIAGNOSIS — Z955 Presence of coronary angioplasty implant and graft: Secondary | ICD-10-CM

## 2020-10-03 NOTE — Progress Notes (Signed)
Daily Session Note  Patient Details  Name: Ryan Garner MRN: [id number] Date of Birth: February 24, 1946 Referring Provider:   Flowsheet Row CARDIAC REHAB PHASE II ORIENTATION from 06/08/2020 in [place]  Referring Provider Dr. Harrington Challenger      Encounter Date: 10/03/2020  Check In:  Session Check In - 10/03/20 1445      Check-In   Supervising physician immediately available to respond to emergencies [facility] MD immediately available    Physician(s) Dr. Harrington Challenger    Location [facility]    Staff Present Cathren Harsh, MS, Exercise Physiologist;Dalton Kris Mouton, MS, ACSM-CEP, Exercise Physiologist    Virtual Visit No    Medication changes reported     No    Fall or balance concerns reported    No    Tobacco Cessation No Change    Warm-up and Cool-down Performed as group-led instruction    Resistance Training Performed Yes    VAD Patient? No    PAD/SET Patient? No      Pain Assessment   Currently in Pain? No/denies    Pain Score 0-No pain    Multiple Pain Sites No           Capillary Blood Glucose: No results found for this or any previous visit (from the past 24 hour(s)).    Social History   Tobacco Use  Smoking Status Former Smoker  . Types: Cigarettes  . Quit date: 04/02/1959  . Years since quitting: 61.5  Smokeless Tobacco Never Used    Goals Met:  Independence with exercise equipment Exercise tolerated well No report of cardiac concerns or symptoms Strength training completed today  Goals Unmet:  Not Applicable  Comments: check out 1545   Dr. Kathie Dike is Medical Director for [facility] Pulmonary Rehab.

## 2020-10-05 ENCOUNTER — Encounter (HOSPITAL_COMMUNITY)
Admission: RE | Admit: 2020-10-05 | Discharge: 2020-10-05 | Disposition: A | Discharge status: Home / Self Care | Payer: Medicare Other | Source: Ambulatory Visit | Attending: Internal Medicine | Admitting: Internal Medicine

## 2020-10-05 ENCOUNTER — Other Ambulatory Visit: Payer: Self-pay

## 2020-10-05 DIAGNOSIS — Z955 Presence of coronary angioplasty implant and graft: Secondary | ICD-10-CM

## 2020-10-05 DIAGNOSIS — I2121 ST elevation (STEMI) myocardial infarction involving left circumflex coronary artery: Secondary | ICD-10-CM | POA: Diagnosis not present

## 2020-10-05 NOTE — Progress Notes (Signed)
Daily Session Note  Patient Details  Name: Ryan Garner MRN: [id number] Date of Birth: 1945-07-28 Referring Provider:   Flowsheet Row CARDIAC REHAB PHASE II ORIENTATION from 06/08/2020 in [place]  Referring Provider Dr. Harrington Challenger      Encounter Date: 10/05/2020  Check In:  Session Check In - 10/05/20 1445      Check-In   Supervising physician immediately available to respond to emergencies [facility] MD immediately available    Physician(s) Dr. Harrington Challenger    Location [facility]    Staff Present Cathren Harsh, MS, Exercise Physiologist;Dalton Kris Mouton, MS, ACSM-CEP, Exercise Physiologist    Virtual Visit No    Medication changes reported     No    Fall or balance concerns reported    No    Tobacco Cessation No Change    Warm-up and Cool-down Performed as group-led instruction    Resistance Training Performed Yes    VAD Patient? No    PAD/SET Patient? No      Pain Assessment   Currently in Pain? No/denies    Pain Score 0-No pain    Multiple Pain Sites No           Capillary Blood Glucose: No results found for this or any previous visit (from the past 24 hour(s)).    Social History   Tobacco Use  Smoking Status Former Smoker  . Types: Cigarettes  . Quit date: 04/02/1959  . Years since quitting: 61.5  Smokeless Tobacco Never Used    Goals Met:  Independence with exercise equipment Exercise tolerated well No report of cardiac concerns or symptoms Strength training completed today  Goals Unmet:  Not Applicable  Comments: check out 1545   Dr. Kathie Dike is Medical Director for [facility] Pulmonary Rehab.

## 2020-10-07 ENCOUNTER — Telehealth: Payer: Medicare Other

## 2020-10-07 ENCOUNTER — Encounter (HOSPITAL_COMMUNITY): Payer: Medicare Other

## 2020-10-10 ENCOUNTER — Other Ambulatory Visit: Payer: Self-pay

## 2020-10-10 ENCOUNTER — Encounter (HOSPITAL_COMMUNITY)
Admission: RE | Admit: 2020-10-10 | Discharge: 2020-10-10 | Disposition: A | Discharge status: Home / Self Care | Payer: Medicare Other | Source: Ambulatory Visit | Attending: Internal Medicine | Admitting: Internal Medicine

## 2020-10-10 VITALS — Wt 295.0 lb

## 2020-10-10 DIAGNOSIS — I2121 ST elevation (STEMI) myocardial infarction involving left circumflex coronary artery: Secondary | ICD-10-CM | POA: Diagnosis not present

## 2020-10-10 DIAGNOSIS — Z955 Presence of coronary angioplasty implant and graft: Secondary | ICD-10-CM

## 2020-10-10 NOTE — Progress Notes (Signed)
Daily Session Note  Patient Details  Name: Ryan Garner MRN: [id number] Date of Birth: [birth year] Referring Provider:   Flowsheet Row CARDIAC REHAB PHASE II ORIENTATION from 06/08/2020 in [place]  Referring Provider Dr. Harrington Challenger      Encounter Date: 10/10/2020  Check In:  Session Check In - 10/10/20 1445      Check-In   Supervising physician immediately available to respond to emergencies [facility] MD immediately available    Physician(s) Dr. Harl Bowie    Location [facility]    Staff Present Geanie Cooley, RN;Madison Audria Nine, MS, Exercise Physiologist    Virtual Visit No    Medication changes reported     No    Fall or balance concerns reported    No    Tobacco Cessation No Change    Warm-up and Cool-down Performed as group-led instruction    Resistance Training Performed Yes    VAD Patient? No    PAD/SET Patient? No      Pain Assessment   Currently in Pain? No/denies    Pain Score 0-No pain    Multiple Pain Sites No           Capillary Blood Glucose: No results found for this or any previous visit (from the past 24 hour(s)).    Social History   Tobacco Use  Smoking Status Former Smoker  . Types: Cigarettes  . Quit date: 04/02/1959  . Years since quitting: 61.5  Smokeless Tobacco Never Used    Goals Met:  Independence with exercise equipment Exercise tolerated well No report of cardiac concerns or symptoms Strength training completed today  Goals Unmet:  Not Applicable  Comments: check out @ 3:45   Dr. Kathie Dike is Medical Director for [facility].

## 2020-10-12 ENCOUNTER — Other Ambulatory Visit: Payer: Self-pay

## 2020-10-12 ENCOUNTER — Encounter (HOSPITAL_COMMUNITY)
Admission: RE | Admit: 2020-10-12 | Discharge: 2020-10-12 | Disposition: A | Discharge status: Home / Self Care | Payer: Medicare Other | Source: Ambulatory Visit | Attending: Internal Medicine | Admitting: Internal Medicine

## 2020-10-12 VITALS — Wt 289.0 lb

## 2020-10-12 DIAGNOSIS — I2121 ST elevation (STEMI) myocardial infarction involving left circumflex coronary artery: Secondary | ICD-10-CM | POA: Diagnosis not present

## 2020-10-12 DIAGNOSIS — Z955 Presence of coronary angioplasty implant and graft: Secondary | ICD-10-CM

## 2020-10-12 NOTE — Progress Notes (Signed)
Daily Session Note  Patient Details  Name: Ryan Garner MRN: [id number] Date of Birth: 1946-03-09 Referring Provider:   Flowsheet Row CARDIAC REHAB PHASE II ORIENTATION from 06/08/2020 in [place]  Referring Provider Dr. Harrington Challenger      Encounter Date: 10/12/2020  Check In:  Session Check In - 10/12/20 1445      Check-In   Supervising physician immediately available to respond to emergencies [facility] MD immediately available    Physician(s) Dr. Harl Bowie    Location [facility]    Staff Present Aundra Dubin, RN, BSN;Ajax Schroll Audria Nine, MS, Exercise Physiologist    Virtual Visit No    Medication changes reported     No    Fall or balance concerns reported    No    Tobacco Cessation No Change    Warm-up and Cool-down Performed as group-led instruction    Resistance Training Performed Yes    VAD Patient? No    PAD/SET Patient? No      Pain Assessment   Currently in Pain? No/denies    Pain Score 0-No pain    Multiple Pain Sites No           Capillary Blood Glucose: No results found for this or any previous visit (from the past 24 hour(s)).    Social History   Tobacco Use  Smoking Status Former Smoker  . Types: Cigarettes  . Quit date: 04/02/1959  . Years since quitting: 61.5  Smokeless Tobacco Never Used    Goals Met:  Independence with exercise equipment Exercise tolerated well No report of cardiac concerns or symptoms Strength training completed today  Goals Unmet:  Not Applicable  Comments: check out 1545   Dr. Kathie Dike is Medical Director for [facility].

## 2020-10-14 ENCOUNTER — Encounter (HOSPITAL_COMMUNITY)
Admission: RE | Admit: 2020-10-14 | Discharge: 2020-10-14 | Disposition: A | Discharge status: Home / Self Care | Payer: Medicare Other | Source: Ambulatory Visit | Attending: Internal Medicine | Admitting: Internal Medicine

## 2020-10-14 ENCOUNTER — Other Ambulatory Visit: Payer: Self-pay

## 2020-10-14 DIAGNOSIS — Z955 Presence of coronary angioplasty implant and graft: Secondary | ICD-10-CM | POA: Diagnosis not present

## 2020-10-14 DIAGNOSIS — I2121 ST elevation (STEMI) myocardial infarction involving left circumflex coronary artery: Secondary | ICD-10-CM

## 2020-10-14 NOTE — Progress Notes (Signed)
Daily Session Note  Patient Details  Name: Ryan Garner MRN: [id number] Date of Birth: [birth year] Referring Provider:   Flowsheet Row CARDIAC REHAB PHASE II ORIENTATION from 06/08/2020 in [place]  Referring Provider Dr. Harrington Challenger      Encounter Date: 10/14/2020  Check In:  Session Check In - 10/14/20 1445      Check-In   Supervising physician immediately available to respond to emergencies [facility] MD immediately available    Physician(s) Dr. Harrington Challenger    Location [facility]    Staff Present Aundra Dubin, RN, BSN;Edythe Riches Audria Nine, MS, Exercise Physiologist    Virtual Visit No    Medication changes reported     No    Fall or balance concerns reported    No    Tobacco Cessation No Change    Warm-up and Cool-down Performed as group-led instruction    Resistance Training Performed Yes    VAD Patient? No    PAD/SET Patient? No      Pain Assessment   Currently in Pain? No/denies    Multiple Pain Sites No           Capillary Blood Glucose: No results found for this or any previous visit (from the past 24 hour(s)).    Social History   Tobacco Use  Smoking Status Former Smoker  . Types: Cigarettes  . Quit date: 04/02/1959  . Years since quitting: 61.5  Smokeless Tobacco Never Used    Goals Met:  Independence with exercise equipment Exercise tolerated well No report of cardiac concerns or symptoms Strength training completed today  Goals Unmet:  Not Applicable  Comments: check out 1545   Dr. Kathie Dike is Medical Director for [facility].

## 2020-10-18 NOTE — Progress Notes (Signed)
Discharge Progress Report  Patient Details  Name: Ryan Garner MRN: [id number] Date of Birth: 1945/12/07 Referring Provider:   Flowsheet Row CARDIAC REHAB PHASE II ORIENTATION from 06/08/2020 in [place]  Referring Provider Dr. Harrington Challenger       Number of Visits: 27  Reason for Discharge:  Patient reached a stable level of exercise. Patient independent in their exercise. Patient has met program and personal goals.  Smoking History:  Social History   Tobacco Use  Smoking Status Former Smoker  . Types: Cigarettes  . Quit date: 04/02/1959  . 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 Harrington Challenger    Expected Discharge Date 09/16/20      NuStep   Level 1    SPM 60    Minutes 22      Arm Ergometer   Level 1    RPM 60    Minutes 17      Prescription Details   Frequency (times per week) 3    Duration Progress to 30 minutes of continuous aerobic without signs/symptoms of physical distress      Intensity   THRR 40-80% of Max Heartrate 58-115    Ratings of Perceived Exertion 11-13    Perceived Dyspnea 0-4      Progression   Progression Continue progressive overload as per policy without signs/symptoms or physical distress.      Resistance Training   Training Prescription Yes    Weight 3 lbs    Reps 10-15           Discharge Exercise Prescription (Final Exercise Prescription Changes):  Exercise Prescription Changes - 10/10/20 1500      Response to Exercise   Blood Pressure (Admit) 130/80    Blood Pressure (Exercise) 128/80    Blood Pressure (Exit) 114/76    Heart Rate (Admit) 78 bpm    Heart Rate  (Exercise) 116 bpm    Heart Rate (Exit) 72 bpm    Rating of Perceived Exertion (Exercise) 12    Duration Continue with 30 min of aerobic exercise without signs/symptoms of physical distress.    Intensity THRR unchanged      Progression   Progression Continue to progress workloads to maintain intensity without signs/symptoms of physical distress.      Resistance Training   Training Prescription Yes    Weight 5 lbs    Reps 10-15    Time 10 Minutes      NuStep   Level 5    SPM 99    Minutes 17    METs 2      Arm Ergometer   Level 5.1    RPM 37    Minutes 22    METs 2           Functional Capacity:  6 Minute Walk    Row Name 06/08/20 1408 10/12/20 1530       6 Minute Walk   Phase Initial Discharge    Distance 700 feet 800 feet    Walk Time 6 minutes 6 minutes    # of Rest Breaks 2 1    MPH 1.33 1.52  METS 0.31 0.8    RPE 15 16    Perceived Dyspnea  15 16    VO2 Peak 1.07 2.82    Symptoms Yes (comment) Yes (comment)    Comments In afib during the test. Took 2 standing rest breaks for 45 and 30 seconds due to SOB Took a standing break at 4:15 to 5 min due to SOB. He had some ankle pain 7/10 due to an injury earlier in the day.    Resting HR 74 bpm 81 bpm    Resting BP 100/70 118/70    Resting Oxygen Saturation  97 % 97 %    Exercise Oxygen Saturation  during 6 min walk 97 % 92 %    Max Ex. HR 99 bpm 106 bpm    Max Ex. BP 112/64 132/74    2 Minute Post BP 102/62 116/82           Psychological, QOL, Others - Outcomes: PHQ 2/9: Depression screen [facility] 2/9 10/18/2020 09/15/2020 06/30/2020 06/08/2020 05/10/2020  Decreased Interest 0 0 0 0 0  Down, Depressed, Hopeless 0 0 0 0 0  PHQ - 2 Score 0 0 0 0 0  Altered sleeping 0 - - 0 -  Tired, decreased energy 1 - - 3 -  Change in appetite 0 - - 0 -  Feeling bad or failure about yourself  0 - - 1 -  Trouble concentrating 0 - - 0 -  Moving slowly or fidgety/restless 0 - - 0 -  Suicidal thoughts 0 - - 0 -  PHQ-9  Score 1 - - 4 -  Difficult doing work/chores Not difficult at all - - Somewhat difficult -  Some recent data might be hidden    Quality of Life:  Quality of Life - 10/18/20 1308      Quality of Life   Select Quality of Life      Quality of Life Scores   Health/Function Pre 19.03 %    Health/Function Post 26.36 %    Health/Function % Change 38.52 %    Socioeconomic Pre 27.36 %    Socioeconomic Post 27.5 %    Socioeconomic % Change  0.51 %    Psych/Spiritual Pre 27.86 %    Psych/Spiritual Post 27.86 %    Psych/Spiritual % Change 0 %    Family Pre 25.2 %    Family Post 28.5 %    Family % Change 13.1 %    GLOBAL Pre 23.47 %    GLOBAL Post 27.19 %    GLOBAL % Change 15.85 %           Personal Goals: Goals established at orientation with interventions provided to work toward goal.  Personal Goals and Risk Factors at Admission - 06/08/20 1406      Core Components/Risk Factors/Patient Goals on Admission    Weight Management Obesity;Yes    Intervention Weight Management: Develop a combined nutrition and exercise program designed to reach desired caloric intake, while maintaining appropriate intake of nutrient and fiber, sodium and fats, and appropriate energy expenditure required for the weight goal.;Weight Management: Provide education and appropriate resources to help participant work on and attain dietary goals.;Weight Management/Obesity: Establish reasonable short term and long term weight goals.;Obesity: Provide education and appropriate resources to help participant work on and attain dietary goals.    Admit Weight 303 lb (137.4 kg)    Goal Weight: Short Term 298 lb (135.2 kg)    Goal Weight: Long Term 293 lb (  132.9 kg)    Personal Goal Other Yes    Personal Goal Get stronger; get back to doing his ADL's. Lose more weight: 10 lbs in the program and more long term.    Intervention Patient will attend CR 3 days/week and supplement with exercise at home 3 days/week.    Expected  Outcomes Patient will meet both personal and program goals.            Personal Goals Discharge:  Goals and Risk Factor Review    Row Name 07/01/20 0802 07/27/20 1521 08/24/20 0741 09/19/20 1018 10/18/20 1533     Core Components/Risk Factors/Patient Goals Review   Personal Goals Review Weight Management/Obesity Weight Management/Obesity Weight Management/Obesity Weight Management/Obesity Weight Management/Obesity   Review Patient is new the the program completing 3 sessions. He has multiple risk factors for CAD and is doing the program for risk modification. His personal goals are to get stronger; be able to do his ADL's and lose 10 lbs in the program and more long-term. We will cotinue to monitor as he works toward meeting both personal and program goals. Patient has completed 6 sessions gaining 10 lbs since his initial visit. His personal goals are to get stronger; be able to do his ADL's; lose 10 lbs in the praogram and more long-term. His pcp referred him to cardiology with SOB. He saw Dr. Harrington Challenger 07/20/20 and she admitted him to the hospital with volume overload for diuresis. He was discharged 07/25/20. He has a follow up visit with Dr. Harrington Challenger 1/17. She does not want him to start back CR until his f/u visit. Patient has been notified. Will continue to monitor. Patient has completed 7 sessions. He lost 16 lbs since he came a month ago after his ER admission and treatment for volume overload. He has been cleared to return to the program by Dr. Harrington Challenger. He saw her for his follow up visit 08/05/20. She added Metolazone to take 30 minutes prior to taking his lasix. He last A1C was 07/21/20 at 6.5% which is trending up from 5 months ago at 6%. His personal goals for the program are to get stronger; be able to do his ADL's; lose 10 lbs in the program and more long term. We will continue to monitor for progress as he works toward meeting these goals. Patient has completed 16 sessions gaining a lb since last 30 day  review. He is doing well in the program with consistent attendance. He has a BNP 2/10 which continues to be elevated. Dr. Harrington Challenger increased his Lasix to 80 mg BID with repeat BNP 3/9. His most recent A1C was 07/21/20 at 6.5% which is trending up. He controlled DM with diet. His personal goals for the program are to get stronger; be able to do his ADL's; lose 10 lbs in the program and more long term. Will continue to monitor as he works toward meeting these goal.s Patient completed 27 sessions before discharge. Overall in the program the patient has lost 11 lbs while in the program. He plans on continuing to lose weight after the program. He is also going to continue to walk and exercise outside of the program. He feels he got stronger whil ein the program and improved his ADL's.   Expected Outcomes Patient will complete the program meeting both personal and program goals. Patient will complete the program meeting both personal and program goals. Patient will complete the program meeting both personal and program goals. Patient will complete the program meeting both  personal and program goals. --          Exercise Goals and Review:  Exercise Goals    Row Name 06/08/20 1413 07/01/20 0743 08/01/20 1407 09/26/20 1545       Exercise Goals   Increase Physical Activity Yes Yes Yes Yes    Intervention Provide advice, education, support and counseling about physical activity/exercise needs.;Develop an individualized exercise prescription for aerobic and resistive training based on initial evaluation findings, risk stratification, comorbidities and participant's personal goals. Provide advice, education, support and counseling about physical activity/exercise needs.;Develop an individualized exercise prescription for aerobic and resistive training based on initial evaluation findings, risk stratification, comorbidities and participant's personal goals. Provide advice, education, support and counseling about  physical activity/exercise needs.;Develop an individualized exercise prescription for aerobic and resistive training based on initial evaluation findings, risk stratification, comorbidities and participant's personal goals. Provide advice, education, support and counseling about physical activity/exercise needs.;Develop an individualized exercise prescription for aerobic and resistive training based on initial evaluation findings, risk stratification, comorbidities and participant's personal goals.    Expected Outcomes Short Term: Attend rehab on a regular basis to increase amount of physical activity.;Long Term: Add in home exercise to make exercise part of routine and to increase amount of physical activity.;Long Term: Exercising regularly at least 3-5 days a week. Short Term: Attend rehab on a regular basis to increase amount of physical activity.;Long Term: Add in home exercise to make exercise part of routine and to increase amount of physical activity.;Long Term: Exercising regularly at least 3-5 days a week. Short Term: Attend rehab on a regular basis to increase amount of physical activity.;Long Term: Add in home exercise to make exercise part of routine and to increase amount of physical activity.;Long Term: Exercising regularly at least 3-5 days a week. Short Term: Attend rehab on a regular basis to increase amount of physical activity.;Long Term: Add in home exercise to make exercise part of routine and to increase amount of physical activity.;Long Term: Exercising regularly at least 3-5 days a week.    Increase Strength and Stamina Yes Yes Yes Yes    Intervention Provide advice, education, support and counseling about physical activity/exercise needs.;Develop an individualized exercise prescription for aerobic and resistive training based on initial evaluation findings, risk stratification, comorbidities and participant's personal goals. Provide advice, education, support and counseling about physical  activity/exercise needs.;Develop an individualized exercise prescription for aerobic and resistive training based on initial evaluation findings, risk stratification, comorbidities and participant's personal goals. Provide advice, education, support and counseling about physical activity/exercise needs.;Develop an individualized exercise prescription for aerobic and resistive training based on initial evaluation findings, risk stratification, comorbidities and participant's personal goals. Provide advice, education, support and counseling about physical activity/exercise needs.;Develop an individualized exercise prescription for aerobic and resistive training based on initial evaluation findings, risk stratification, comorbidities and participant's personal goals.    Expected Outcomes Short Term: Increase workloads from initial exercise prescription for resistance, speed, and METs.;Short Term: Perform resistance training exercises routinely during rehab and add in resistance training at home;Long Term: Improve cardiorespiratory fitness, muscular endurance and strength as measured by increased METs and functional capacity (6MWT) Short Term: Increase workloads from initial exercise prescription for resistance, speed, and METs.;Short Term: Perform resistance training exercises routinely during rehab and add in resistance training at home;Long Term: Improve cardiorespiratory fitness, muscular endurance and strength as measured by increased METs and functional capacity (6MWT) Short Term: Increase workloads from initial exercise prescription for resistance, speed, and METs.;Short Term: Perform resistance  training exercises routinely during rehab and add in resistance training at home;Long Term: Improve cardiorespiratory fitness, muscular endurance and strength as measured by increased METs and functional capacity (6MWT) Short Term: Increase workloads from initial exercise prescription for resistance, speed, and  METs.;Short Term: Perform resistance training exercises routinely during rehab and add in resistance training at home;Long Term: Improve cardiorespiratory fitness, muscular endurance and strength as measured by increased METs and functional capacity (6MWT)    Able to understand and use rate of perceived exertion (RPE) scale Yes Yes Yes Yes    Intervention Provide education and explanation on how to use RPE scale Provide education and explanation on how to use RPE scale Provide education and explanation on how to use RPE scale Provide education and explanation on how to use RPE scale    Expected Outcomes Short Term: Able to use RPE daily in rehab to express subjective intensity level;Long Term:  Able to use RPE to guide intensity level when exercising independently Short Term: Able to use RPE daily in rehab to express subjective intensity level;Long Term:  Able to use RPE to guide intensity level when exercising independently Short Term: Able to use RPE daily in rehab to express subjective intensity level;Long Term:  Able to use RPE to guide intensity level when exercising independently Short Term: Able to use RPE daily in rehab to express subjective intensity level;Long Term:  Able to use RPE to guide intensity level when exercising independently    Knowledge and understanding of Target Heart Rate Range (THRR) Yes Yes Yes Yes    Intervention Provide education and explanation of THRR including how the numbers were predicted and where they are located for reference Provide education and explanation of THRR including how the numbers were predicted and where they are located for reference Provide education and explanation of THRR including how the numbers were predicted and where they are located for reference Provide education and explanation of THRR including how the numbers were predicted and where they are located for reference    Expected Outcomes Short Term: Able to state/look up THRR;Long Term: Able to use  THRR to govern intensity when exercising independently;Short Term: Able to use daily as guideline for intensity in rehab Short Term: Able to state/look up THRR;Long Term: Able to use THRR to govern intensity when exercising independently;Short Term: Able to use daily as guideline for intensity in rehab Short Term: Able to state/look up THRR;Long Term: Able to use THRR to govern intensity when exercising independently;Short Term: Able to use daily as guideline for intensity in rehab Short Term: Able to state/look up THRR;Long Term: Able to use THRR to govern intensity when exercising independently;Short Term: Able to use daily as guideline for intensity in rehab    Able to check pulse independently Yes Yes Yes Yes    Intervention Provide education and demonstration on how to check pulse in carotid and radial arteries.;Review the importance of being able to check your own pulse for safety during independent exercise Review the importance of being able to check your own pulse for safety during independent exercise;Provide education and demonstration on how to check pulse in carotid and radial arteries. Review the importance of being able to check your own pulse for safety during independent exercise;Provide education and demonstration on how to check pulse in carotid and radial arteries. Review the importance of being able to check your own pulse for safety during independent exercise;Provide education and demonstration on how to check pulse in carotid and radial arteries.  Expected Outcomes Long Term: Able to check pulse independently and accurately;Short Term: Able to explain why pulse checking is important during independent exercise Short Term: Able to explain why pulse checking is important during independent exercise;Long Term: Able to check pulse independently and accurately Short Term: Able to explain why pulse checking is important during independent exercise;Long Term: Able to check pulse independently  and accurately Short Term: Able to explain why pulse checking is important during independent exercise;Long Term: Able to check pulse independently and accurately    Understanding of Exercise Prescription Yes Yes Yes Yes    Intervention Provide education, explanation, and written materials on patient's individual exercise prescription Provide education, explanation, and written materials on patient's individual exercise prescription Provide education, explanation, and written materials on patient's individual exercise prescription Provide education, explanation, and written materials on patient's individual exercise prescription    Expected Outcomes Short Term: Able to explain program exercise prescription;Long Term: Able to explain home exercise prescription to exercise independently Short Term: Able to explain program exercise prescription;Long Term: Able to explain home exercise prescription to exercise independently Short Term: Able to explain program exercise prescription;Long Term: Able to explain home exercise prescription to exercise independently Short Term: Able to explain program exercise prescription;Long Term: Able to explain home exercise prescription to exercise independently           Exercise Goals Re-Evaluation:  Exercise Goals Re-Evaluation    Row Name 07/01/20 0744 08/01/20 1407 08/30/20 0808 09/26/20 1545 10/18/20 1525     Exercise Goal Re-Evaluation   Exercise Goals Review Increase Physical Activity;Increase Strength and Stamina;Able to understand and use rate of perceived exertion (RPE) scale;Knowledge and understanding of Target Heart Rate Range (THRR);Able to check pulse independently;Understanding of Exercise Prescription Increase Physical Activity;Increase Strength and Stamina;Able to understand and use rate of perceived exertion (RPE) scale;Knowledge and understanding of Target Heart Rate Range (THRR);Able to check pulse independently;Understanding of Exercise Prescription  Increase Physical Activity;Increase Strength and Stamina;Able to understand and use rate of perceived exertion (RPE) scale;Knowledge and understanding of Target Heart Rate Range (THRR);Able to check pulse independently;Understanding of Exercise Prescription Increase Physical Activity;Increase Strength and Stamina;Able to understand and use rate of perceived exertion (RPE) scale;Knowledge and understanding of Target Heart Rate Range (THRR);Able to check pulse independently;Understanding of Exercise Prescription Increase Physical Activity;Increase Strength and Stamina;Able to understand and use rate of perceived exertion (RPE) scale;Knowledge and understanding of Target Heart Rate Range (THRR);Able to check pulse independently;Understanding of Exercise Prescription   Comments Patient has completed 1 exerise session. He has tolerated exercise well. He is eager to come to rehab and has a positive attitude. He is currently exercising at 2.1 METs on the NuStep. Will continue to monitor and progress exercise as able. Pt has completed 5 exercise sessions. He has been frequently absent, so he has not progressed. He was recently hospitialized due to being fluid overloaded. While in the hospitial they were able to get over 20 lbs of fluid out of him. He has a follow up appointment with his cardiologist 08/08/2020 to be cleared to return to exercise. He eats out a lot and cosumes a lot of salt in his diet which greatly contributes to his fluid retention. He was exercising at 2.0 METs on the stepper before he was hospitialized. Will continue to monitor and progress as able. Patient has completed 8 exercise sessions. He was out for about a month due to fluid build up, so he is starting to get back into it. He has been back  for about a week and is progressing. He is currently exercising at 2.1 METs on the NuStep. Patient has completed 19 exercise sessions. He has been progressing very well and feels he is getting stronger. He is  increasing workloads and tolerating it well. He is always enthusiastic about coming to rehab. He is exercising at home and says that is going well. He is currently exercising at 2.4 METs on the NuStep. Will continue to monitor and progress. Patient had completed 27 sessions before discharge. He progressed very well in the program. He felt he got stronger and was able to do ADL's more efficiently. He always had a positive attitude about coming to rehab and was a joy to work with. He ended the program exercising at 2 METs.   Expected Outcomes Through exercise at rehab and with a home exercise plan, patient will achieve their goals. Through exercise at rehab and with a home exercise plan, patient will achieve their goals. Through exercise at rehab and a home exercise plan, patient will reach their goals. Through exercise at rehab and a home exercise plan, patient will reach their goals. --          Nutrition & Weight - Outcomes:  Pre Biometrics - 10/12/20 1532      Pre Biometrics   Weight 131.1 kg    Waist Circumference 56.5 inches    Hip Circumference 52 inches    Waist to Hip Ratio 1.09 %    Triceps Skinfold 25 mm    % Body Fat 44 %    Grip Strength 26.7 kg    Flexibility 0 in    Single Leg Stand 2.45 seconds            Nutrition:  Nutrition Therapy & Goals - 09/19/20 1017      Personal Nutrition Goals   Comments We will continue to provide heart healthy nutritional education through hand-outs.      Intervention Plan   Intervention Nutrition handout(s) given to patient.           Nutrition Discharge:  Nutrition Assessments - 10/18/20 1309      MEDFICTS Scores   Pre Score 41    Post Score 9    Score Difference -32           Education Questionnaire Score:  Knowledge Questionnaire Score - 10/18/20 1309      Knowledge Questionnaire Score   Pre Score 18/24    Post Score 19/24          Patient graduated from [place] today on 10-14-20 after  completing 27 sessions. They achieved LTG of 30 minutes of aerobic exercise at Max Met level of 2 METs. All patients vitals are WNL. Discharge instruction has been reviewed in detail and patient stated an understanding of material given. Patient plans to exercise at home. Cardiac Rehab staff will make f/u call. Patient had no complaints of any abnormal S/S or pain on their exit visit.     Goals reviewed with patient; copy given to patient.

## 2020-10-27 ENCOUNTER — Encounter: Payer: Self-pay | Admitting: Family Medicine

## 2020-10-27 ENCOUNTER — Ambulatory Visit (INDEPENDENT_AMBULATORY_CARE_PROVIDER_SITE_OTHER): Payer: Medicare Other | Admitting: Family Medicine

## 2020-10-27 ENCOUNTER — Other Ambulatory Visit: Payer: Self-pay

## 2020-10-27 VITALS — BP 122/84 | HR 77 | Ht 68.0 in | Wt 299.0 lb

## 2020-10-27 DIAGNOSIS — I2510 Atherosclerotic heart disease of native coronary artery without angina pectoris: Secondary | ICD-10-CM

## 2020-10-27 DIAGNOSIS — E782 Mixed hyperlipidemia: Secondary | ICD-10-CM | POA: Diagnosis not present

## 2020-10-27 DIAGNOSIS — E1159 Type 2 diabetes mellitus with other circulatory complications: Secondary | ICD-10-CM | POA: Diagnosis not present

## 2020-10-27 DIAGNOSIS — I5042 Chronic combined systolic (congestive) and diastolic (congestive) heart failure: Secondary | ICD-10-CM | POA: Diagnosis not present

## 2020-10-27 DIAGNOSIS — I2121 ST elevation (STEMI) myocardial infarction involving left circumflex coronary artery: Secondary | ICD-10-CM

## 2020-10-27 DIAGNOSIS — I4819 Other persistent atrial fibrillation: Secondary | ICD-10-CM | POA: Diagnosis not present

## 2020-10-27 DIAGNOSIS — I10 Essential (primary) hypertension: Secondary | ICD-10-CM | POA: Diagnosis not present

## 2020-10-27 DIAGNOSIS — E1169 Type 2 diabetes mellitus with other specified complication: Secondary | ICD-10-CM

## 2020-10-27 LAB — BAYER DCA HB A1C WAIVED: HB A1C (BAYER DCA - WAIVED): 6.7 % (ref <7.0)

## 2020-10-27 MED ORDER — METOPROLOL TARTRATE 25 MG PO TABS: 37.5000 mg | ORAL_TABLET | Freq: Two times a day (BID) | ORAL | 11 refills | Status: DC

## 2020-10-27 NOTE — Progress Notes (Signed)
BP 122/84   Pulse 77   Ht 5\' 8"  (1.727 m)   Wt 299 lb (135.6 kg)   SpO2 94%   BMI 45.46 kg/m    Subjective:   Patient ID: Ryan Garner, male    DOB: [birth year], 75 y.o.   MRN: [id number]  HPI: HARSHAAN Garner is a 75 y.o. male presenting on 10/27/2020 for Diabetes   HPI Type 2 diabetes mellitus Patient comes in today for recheck of his diabetes. Patient has been currently taking no medication and has been diet controlled, will check A1c today. Patient is currently on an ACE inhibitor/ARB. Patient has seen an ophthalmologist this year. Patient denies any issues with their feet except swelling.. The symptom started onset as an adult hypertension and hyperlipidemia and CHF and CAD and A. fib ARE RELATED TO DM   Hypertension and CHF Patient is currently on metolazone and Plavix and metoprolol and furosemide as needed, and their blood pressure today is 122/84. Patient denies any lightheadedness or dizziness. Patient denies headaches, blurred vision, chest pains, shortness of breath, or weakness. Denies any side effects from medication and is content with current medication.  He keeps tabs on his weights and checks it every day, is up 3 pounds he takes extra furosemide.  Hyperlipidemia Patient is coming in for recheck of his hyperlipidemia. The patient is currently taking atorvastatin. They deny any issues with myalgias or history of liver damage from it. They deny any focal numbness or weakness or chest pain.   A. fib recheck Taking Eliquis Patient denies any bruising or bleeding or chest pain or palpitations   Relevant past medical, surgical, family and social history reviewed and updated as indicated. Interim medical history since our last visit reviewed. Allergies and medications reviewed and updated.  Review of Systems  Constitutional: Negative for chills and fever.  Respiratory: Negative for shortness of breath and wheezing.   Cardiovascular: Positive for leg swelling (3+  bilateral lower extremity edema). Negative for chest pain.  Musculoskeletal: Negative for back pain and gait problem.  Skin: Negative for rash.  Neurological: Negative for dizziness, weakness and numbness.  All other systems reviewed and are negative.   Per HPI unless specifically indicated above   Allergies as of 10/27/2020      Reactions   Flecainide    Per patient dizziness, shortness of breath, blurred vision   Rofecoxib Other (See Comments)   (Vioxx)      Medication List       Accurate as of October 27, 2020 10:58 AM. If you have any questions, ask your nurse or doctor.        STOP taking these medications   cefdinir 300 MG capsule Commonly known as: OMNICEF Stopped by: [name] Kiyona Mcnall, MD   predniSONE 20 MG tablet Commonly known as: DELTASONE Stopped by: [name] Avyonna Wagoner, MD     TAKE these medications   apixaban 5 MG Tabs tablet Commonly known as: ELIQUIS Take 1 tablet (5 mg total) by mouth 2 (two) times daily.   atorvastatin 80 MG tablet Commonly known as: LIPITOR Take 1 tablet (80 mg total) by mouth daily.   clopidogrel 75 MG tablet Commonly known as: PLAVIX Take 1 tablet (75 mg total) by mouth daily with breakfast.   fluticasone 50 MCG/ACT nasal spray Commonly known as: FLONASE Place 2 sprays into both nostrils daily.   furosemide 40 MG tablet Commonly known as: LASIX Take 2 tablets (80 mg total) by mouth daily.   metolazone  5 MG tablet Commonly known as: ZAROXOLYN Take 1 tablet (5 mg total) by mouth as needed (dypsnea or weight up 3 lbs).   metoprolol tartrate 25 MG tablet Commonly known as: LOPRESSOR Take 1.5 tablets (37.5 mg total) by mouth 2 (two) times daily.   multivitamin with minerals Tabs tablet Take 1 tablet by mouth daily.   nitroGLYCERIN 0.4 MG SL tablet Commonly known as: NITROSTAT Place 1 tablet (0.4 mg total) under the tongue every 5 (five) minutes x 3 doses as needed for chest pain.   potassium chloride SA 20 MEQ  tablet Commonly known as: KLOR-CON Take 1 tablet (20 mEq total) by mouth daily.   Prostate Support 300-15 MG Tabs Take 1 tablet by mouth daily. Super Beta Prostate        Objective:   BP 122/84   Pulse 77   Ht 5\' 8"  (1.727 m)   Wt 299 lb (135.6 kg)   SpO2 94%   BMI 45.46 kg/m   Wt Readings from Last 3 Encounters:  10/27/20 299 lb (135.6 kg)  10/12/20 289 lb 0.4 oz (131.1 kg)  10/10/20 294 lb 15.6 oz (133.8 kg)    Physical Exam Vitals and nursing note reviewed.  Constitutional:      General: He is not in acute distress.    Appearance: He is well-developed. He is not diaphoretic.  Eyes:     General: No scleral icterus.    Conjunctiva/sclera: Conjunctivae normal.  Neck:     Thyroid: No thyromegaly.  Cardiovascular:     Rate and Rhythm: Normal rate and regular rhythm.     Heart sounds: Normal heart sounds. No murmur heard.   Pulmonary:     Effort: Pulmonary effort is normal. No respiratory distress.     Breath sounds: Normal breath sounds. No wheezing.  Musculoskeletal:        General: Swelling (3+ bilateral lower extremity pitting edema) present. Normal range of motion.     Cervical back: Neck supple.  Lymphadenopathy:     Cervical: No cervical adenopathy.  Skin:    General: Skin is warm and dry.     Findings: No rash.  Neurological:     Mental Status: He is alert and oriented to person, place, and time.     Coordination: Coordination normal.  Psychiatric:        Behavior: Behavior normal.       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 Follow up plan: Return in about 6 months (around 04/28/2021), or if symptoms worsen or fail to improve, for Diabetes and hypertension and A. fib.  Counseling provided for all of the vaccine components No orders of the defined types were placed in this encounter.   Caryl Pina, MD [facility] 10/27/2020, 10:58 AM

## 2020-10-28 LAB — LIPID PANEL
Chol/HDL Ratio: 2.6 ratio (ref 0.0–5.0)
Cholesterol, Total: 106 mg/dL (ref 100–199)
HDL: 41 mg/dL (ref >39)
LDL Chol Calc (NIH): 51 mg/dL (ref 0–99)
Triglycerides: 65 mg/dL (ref 0–149)
VLDL Cholesterol Cal: 14 mg/dL (ref 5–40)

## 2020-10-28 LAB — CBC WITH DIFFERENTIAL/PLATELET
Basophils Absolute: 0 10*3/uL (ref 0.0–0.2)
Basos: 0 %
EOS (ABSOLUTE): 0.1 10*3/uL (ref 0.0–0.4)
Eos: 1 %
Hematocrit: 41 % (ref 37.5–51.0)
Hemoglobin: 13.8 g/dL (ref 13.0–17.7)
Immature Grans (Abs): 0 10*3/uL (ref 0.0–0.1)
Immature Granulocytes: 0 %
Lymphocytes Absolute: 1.5 10*3/uL (ref 0.7–3.1)
Lymphs: 17 %
MCH: 29.2 pg (ref 26.6–33.0)
MCHC: 33.7 g/dL (ref 31.5–35.7)
MCV: 87 fL (ref 79–97)
Monocytes Absolute: 0.8 10*3/uL (ref 0.1–0.9)
Monocytes: 8 %
Neutrophils Absolute: 6.6 10*3/uL (ref 1.4–7.0)
Neutrophils: 74 %
Platelets: 296 10*3/uL (ref 150–450)
RBC: 4.72 x10E6/uL (ref 4.14–5.80)
RDW: 12.3 % (ref 11.6–15.4)
WBC: 9 10*3/uL (ref 3.4–10.8)

## 2020-10-28 LAB — MICROALBUMIN / CREATININE URINE RATIO
Creatinine, Urine: 13.2 mg/dL
Microalb/Creat Ratio: 209 mg/g creat — ABNORMAL HIGH (ref 0–29)
Microalbumin, Urine: 27.6 ug/mL

## 2020-10-31 ENCOUNTER — Other Ambulatory Visit: Payer: Self-pay

## 2020-10-31 DIAGNOSIS — R809 Proteinuria, unspecified: Secondary | ICD-10-CM

## 2020-10-31 MED ORDER — LISINOPRIL 2.5 MG PO TABS: 2.5000 mg | ORAL_TABLET | Freq: Every day | ORAL | 1 refills | Status: DC

## 2020-11-01 ENCOUNTER — Other Ambulatory Visit: Payer: Self-pay | Admitting: *Deleted

## 2020-11-01 MED ORDER — APIXABAN 5 MG PO TABS: 5.0000 mg | ORAL_TABLET | Freq: Two times a day (BID) | ORAL | 3 refills | Status: DC

## 2020-11-04 ENCOUNTER — Telehealth: Payer: Self-pay | Admitting: Internal Medicine

## 2020-11-04 ENCOUNTER — Telehealth: Payer: Self-pay

## 2020-11-04 NOTE — Telephone Encounter (Signed)
Hey Lynn, LPN, can you please advise on this matter? Thanks  ?

## 2020-11-04 NOTE — Telephone Encounter (Signed)
Patient calling the office for samples of medication: ° ° °1.  What medication and dosage are you requesting samples for? ° °apixaban (ELIQUIS) 5 MG TABS tablet ° °2.  Are you currently out of this medication?  yes ° ° °

## 2020-11-04 NOTE — Telephone Encounter (Signed)
Patient requested samples of Eliquis 5 mg tablets (#28) Lot#: [id number] Exp: 01/2023

## 2020-11-04 NOTE — Telephone Encounter (Signed)
Thanks Jeani Hawking, LPN, will leave 2 boxes of Eliquis 5 mg tablet at the front desk for pt to pick up.  Lot: [id number]   Exp: 02/2022

## 2020-11-04 NOTE — Telephone Encounter (Signed)
**Note De-Identified Hubert Derstine Obfuscation** I called [facility] to get the insurance info needed from the pts card that his Eliquis is being ran under but was advised by Wille Glaser that the pt has no Part D coverage on file with them and that they have not filled the pts Eliquis since March 03, 2020 and that the pt used a free co-pay card at that time. Per Joe the retail cost for Eliquis is $600/60 tablets  I called the pt to discuss and he states that his Eliquis will not arrive from [facility] until Monday or Tuesday next week and that he is out and leaving today for [place] 9 [place] the beach.   I cannot find any notes on him applying for asst with [employer] but he states that he was approved and that Levell July, NP took care of his Eliquis Pt Asst through [employer]   I advised him that we will leave him 2 boxes of Eliquis 5 mg samples in the front office at Dr. Alan Ripper office at [facility]	[address] in [place], [place] for him to pick up.  He thanked me for our assistance.

## 2020-11-04 NOTE — Telephone Encounter (Signed)
Spoke to patient who is coming to the [place] office to pick up samples of Eliquis. I asked patient about [employer] and when he applied. Patient stated he applied last week and dropped paperwork off at the [place] office. I will forward to [facility] Triage and [employer].

## 2020-11-09 ENCOUNTER — Telehealth: Payer: Medicare Other

## 2020-11-15 ENCOUNTER — Ambulatory Visit (INDEPENDENT_AMBULATORY_CARE_PROVIDER_SITE_OTHER): Payer: Medicare Other | Admitting: Family Medicine

## 2020-11-15 ENCOUNTER — Encounter: Payer: Self-pay | Admitting: Family Medicine

## 2020-11-15 DIAGNOSIS — J019 Acute sinusitis, unspecified: Secondary | ICD-10-CM

## 2020-11-15 MED ORDER — CEFDINIR 300 MG PO CAPS: 300.0000 mg | ORAL_CAPSULE | Freq: Two times a day (BID) | ORAL | 0 refills | Status: DC

## 2020-11-15 MED ORDER — BENZONATATE 100 MG PO CAPS: 100.0000 mg | ORAL_CAPSULE | Freq: Three times a day (TID) | ORAL | 0 refills | Status: DC | PRN

## 2020-11-15 NOTE — Progress Notes (Signed)
Virtual Visit via telephone Note  I connected with Ryan Garner on 11/15/20 at 1517 by telephone and verified that I am speaking with the correct person using two identifiers. Ryan Garner is currently located at home and patient are currently with her during visit. The provider, [name] Jacquetta Polhamus, MD is located in their office at time of visit.  Call ended at 1528  I discussed the limitations, risks, security and privacy concerns of performing an evaluation and management service by telephone and the availability of in person appointments. I also discussed with the patient that there may be a patient responsible charge related to this service. The patient expressed understanding and agreed to proceed.   History and Present Illness: Patient is having sinus pressure behind his eye to ear and above teeth.  He is having coughing spells and no SOB.  He did have small fever and small chill. He was at [employer].  Bending forward makes it worse.  Cough is worse at night. He started with symptoms about 5 days ago.   No diagnosis found.  Outpatient Encounter Medications as of 11/15/2020  Medication Sig  . apixaban (ELIQUIS) 5 MG TABS tablet Take 1 tablet (5 mg total) by mouth 2 (two) times daily.  Marland Kitchen aspirin 81 MG EC tablet TAKE 1 TABLET (81 MG TOTAL) BY MOUTH DAILY. SWALLOW WHOLE.  Marland Kitchen atorvastatin (LIPITOR) 80 MG tablet Take 1 tablet (80 mg total) by mouth daily.  . clopidogrel (PLAVIX) 75 MG tablet Take 1 tablet (75 mg total) by mouth daily with breakfast.  . fluticasone (FLONASE) 50 MCG/ACT nasal spray Place 2 sprays into both nostrils daily.  . furosemide (LASIX) 40 MG tablet Take 2 tablets (80 mg total) by mouth daily.  . furosemide (LASIX) 40 MG tablet TAKE 1 TABLET (40 MG TOTAL) BY MOUTH DAILY.  Marland Kitchen lisinopril (ZESTRIL) 2.5 MG tablet Take 1 tablet (2.5 mg total) by mouth daily.  . metolazone (ZAROXOLYN) 5 MG tablet Take 1 tablet (5 mg total) by mouth as needed (dypsnea or weight up 3  lbs).  . metoprolol tartrate (LOPRESSOR) 25 MG tablet Take 1.5 tablets (37.5 mg total) by mouth 2 (two) times daily.  . Misc Natural Products (PROSTATE SUPPORT) 300-15 MG TABS Take 1 tablet by mouth daily. Super Beta Prostate  . Multiple Vitamin (MULTIVITAMIN WITH MINERALS) TABS tablet Take 1 tablet by mouth daily.  . nitroGLYCERIN (NITROSTAT) 0.4 MG SL tablet Place 1 tablet (0.4 mg total) under the tongue every 5 (five) minutes x 3 doses as needed for chest pain.  . nitroGLYCERIN (NITROSTAT) 0.4 MG SL tablet PLACE 1 TABLET (0.4 MG TOTAL) UNDER THE TONGUE EVERY FIVE MINUTES X 3 DOSES AS NEEDED FOR CHEST PAIN.  Marland Kitchen potassium chloride SA (KLOR-CON) 20 MEQ tablet Take 1 tablet (20 mEq total) by mouth daily.   No facility-administered encounter medications on file as of 11/15/2020.    Review of Systems  Constitutional: Positive for chills and fever.  HENT: Positive for congestion, postnasal drip, rhinorrhea and sinus pressure. Negative for ear discharge, ear pain, sneezing, sore throat and voice change.   Eyes: Negative for pain, discharge, redness and visual disturbance.  Respiratory: Positive for cough. Negative for shortness of breath and wheezing.   Cardiovascular: Negative for chest pain and leg swelling.  Musculoskeletal: Negative for gait problem.  Skin: Negative for rash.  All other systems reviewed and are negative.   Observations/Objective: Patient sounds comfortable and in no acute distress.  Assessment and Plan: Problem  List Items Addressed This Visit   None   Visit Diagnoses    Acute sinusitis, recurrence not specified, unspecified location    -  Primary   Relevant Medications   benzonatate (TESSALON PERLES) 100 MG capsule   cefdinir (OMNICEF) 300 MG capsule   Other Relevant Orders   Novel Coronavirus, NAA ([facility])      We will go ahead and treat like sinus infection for the patient.  We will monitor closely and if does not improve or worsens he will give [place] a call.  Sent  cefdinir and recommended for COVID testing as well.  They were in [facility] recently so the travel may have led to them catching something. Follow up plan: Return if symptoms worsen or fail to improve.     I discussed the assessment and treatment plan with the patient. The patient was provided an opportunity to ask questions and all were answered. The patient agreed with the plan and demonstrated an understanding of the instructions.   The patient was advised to call back or seek an in-person evaluation if the symptoms worsen or if the condition fails to improve as anticipated.  The above assessment and management plan was discussed with the patient. The patient verbalized understanding of and has agreed to the management plan. Patient is aware to call the clinic if symptoms persist or worsen. Patient is aware when to return to the clinic for a follow-up visit. Patient educated on when it is appropriate to go to the emergency department.    I provided 11 minutes of non-face-to-face time during this encounter.    Worthy Rancher, MD

## 2020-11-16 ENCOUNTER — Other Ambulatory Visit: Payer: Self-pay

## 2020-11-16 ENCOUNTER — Other Ambulatory Visit: Payer: Medicare Other | Admitting: *Deleted

## 2020-11-16 DIAGNOSIS — I2510 Atherosclerotic heart disease of native coronary artery without angina pectoris: Secondary | ICD-10-CM | POA: Diagnosis not present

## 2020-11-16 DIAGNOSIS — I509 Heart failure, unspecified: Secondary | ICD-10-CM | POA: Diagnosis not present

## 2020-11-16 DIAGNOSIS — J019 Acute sinusitis, unspecified: Secondary | ICD-10-CM | POA: Diagnosis not present

## 2020-11-16 NOTE — Addendum Note (Signed)
Addended by: Reather Converse on: 11/16/2020 03:44 PM   Modules accepted: Orders

## 2020-11-17 LAB — BASIC METABOLIC PANEL
BUN/Creatinine Ratio: 22 (ref 10–24)
BUN: 23 mg/dL (ref 8–27)
CO2: 24 mmol/L (ref 20–29)
Calcium: 9.3 mg/dL (ref 8.6–10.2)
Chloride: 99 mmol/L (ref 96–106)
Creatinine, Ser: 1.03 mg/dL (ref 0.76–1.27)
Glucose: 139 mg/dL — ABNORMAL HIGH (ref 65–99)
Potassium: 4.1 mmol/L (ref 3.5–5.2)
Sodium: 138 mmol/L (ref 134–144)
eGFR: 75 mL/min/{1.73_m2} (ref >59)

## 2020-11-17 LAB — SARS-COV-2, NAA 2 DAY TAT

## 2020-11-17 LAB — NOVEL CORONAVIRUS, NAA: SARS-CoV-2, NAA: NOT DETECTED

## 2020-11-17 LAB — PRO B NATRIURETIC PEPTIDE: NT-Pro BNP: 2537 pg/mL — ABNORMAL HIGH (ref 0–486)

## 2020-11-21 ENCOUNTER — Telehealth: Payer: Self-pay | Admitting: *Deleted

## 2020-11-21 DIAGNOSIS — I5042 Chronic combined systolic (congestive) and diastolic (congestive) heart failure: Secondary | ICD-10-CM

## 2020-11-21 NOTE — Telephone Encounter (Signed)
Pt states that he has had some weight gain and has started taking extra lasix. Pt will start taking Zaroxolyn 2 times weekly 30 mins before Lasix.

## 2020-11-21 NOTE — Telephone Encounter (Signed)
-----   Message from Fay Records, MD sent at 11/17/2020 12:29 PM EDT ----- Electrolytes and kidney function are OK FLuid is up Is he watching salt intake?  Weights daily?  Is wt up? How often is he taking Zaroxylyn ?      I would take 2x per week to start   30 mn before lasix    Check BMEt and BNP in 10 to 14 days

## 2020-12-06 ENCOUNTER — Other Ambulatory Visit: Payer: Self-pay | Admitting: Internal Medicine

## 2020-12-07 ENCOUNTER — Other Ambulatory Visit: Payer: Self-pay | Admitting: Internal Medicine

## 2020-12-12 ENCOUNTER — Telehealth: Payer: Self-pay | Admitting: Internal Medicine

## 2020-12-12 ENCOUNTER — Other Ambulatory Visit: Payer: Self-pay

## 2020-12-12 ENCOUNTER — Other Ambulatory Visit: Payer: Medicare Other

## 2020-12-12 DIAGNOSIS — R0602 Shortness of breath: Secondary | ICD-10-CM | POA: Diagnosis not present

## 2020-12-12 LAB — CBC
Hematocrit: 38.3 % (ref 37.5–51.0)
Hemoglobin: 13.1 g/dL (ref 13.0–17.7)
MCH: 29.4 pg (ref 26.6–33.0)
MCHC: 34.2 g/dL (ref 31.5–35.7)
MCV: 86 fL (ref 79–97)
Platelets: 238 10*3/uL (ref 150–450)
RBC: 4.45 x10E6/uL (ref 4.14–5.80)
RDW: 12.6 % (ref 11.6–15.4)
WBC: 8.9 10*3/uL (ref 3.4–10.8)

## 2020-12-12 MED ORDER — ATORVASTATIN CALCIUM 80 MG PO TABS: 80.0000 mg | ORAL_TABLET | Freq: Every day | ORAL | 2 refills | Status: DC

## 2020-12-12 NOTE — Telephone Encounter (Signed)
Pt's medication was sent to pt's pharmacy as requested. Confirmation received.  °

## 2020-12-12 NOTE — Telephone Encounter (Signed)
*  STAT* If patient is at the pharmacy, call can be transferred to refill team.   1. Which medications need to be refilled? (please list name of each medication and dose if known) Lipitor   2. Which pharmacy/location (including street and city if local pharmacy) is medication to be sent to? [facility]   3. Do they need a 30 day or 90 day supply? [place]

## 2020-12-13 LAB — COMPREHENSIVE METABOLIC PANEL
ALT: 15 IU/L (ref 0–44)
AST: 14 IU/L (ref 0–40)
Albumin/Globulin Ratio: 1.6 (ref 1.2–2.2)
Albumin: 4.2 g/dL (ref 3.7–4.7)
Alkaline Phosphatase: 92 IU/L (ref 44–121)
BUN/Creatinine Ratio: 29 — ABNORMAL HIGH (ref 10–24)
BUN: 31 mg/dL — ABNORMAL HIGH (ref 8–27)
Bilirubin Total: 0.5 mg/dL (ref 0.0–1.2)
CO2: 26 mmol/L (ref 20–29)
Calcium: 9.6 mg/dL (ref 8.6–10.2)
Chloride: 97 mmol/L (ref 96–106)
Creatinine, Ser: 1.07 mg/dL (ref 0.76–1.27)
Globulin, Total: 2.6 g/dL (ref 1.5–4.5)
Glucose: 163 mg/dL — ABNORMAL HIGH (ref 65–99)
Potassium: 4.3 mmol/L (ref 3.5–5.2)
Sodium: 140 mmol/L (ref 134–144)
Total Protein: 6.8 g/dL (ref 6.0–8.5)
eGFR: 72 mL/min/{1.73_m2} (ref >59)

## 2020-12-13 LAB — PRO B NATRIURETIC PEPTIDE: NT-Pro BNP: 2268 pg/mL — ABNORMAL HIGH (ref 0–486)

## 2020-12-14 ENCOUNTER — Telehealth: Payer: Self-pay | Admitting: Internal Medicine

## 2020-12-14 MED ORDER — EMPAGLIFLOZIN 10 MG PO TABS: 10.0000 mg | ORAL_TABLET | Freq: Every day | ORAL | 11 refills | Status: DC

## 2020-12-14 NOTE — Telephone Encounter (Signed)
-----   Message from Fay Records, MD sent at 12/14/2020  1:38 PM EDT ----- Patient should have clinic appt in next month

## 2020-12-14 NOTE — Telephone Encounter (Signed)
PT is calling back the call got disconnected

## 2020-12-14 NOTE — Telephone Encounter (Signed)
Reviewed lab results and Dr. Alan Ripper recommendation to begin 10mg  Jardiance. Currently he is out of town, so he will begin this when he returns in two weeks.

## 2020-12-14 NOTE — Telephone Encounter (Signed)
LVM for return call. 

## 2020-12-14 NOTE — Telephone Encounter (Signed)
Pt is returning call from earlier today. Please advise

## 2020-12-15 ENCOUNTER — Telehealth: Payer: Medicare Other

## 2020-12-15 ENCOUNTER — Telehealth: Payer: Self-pay

## 2020-12-15 NOTE — Telephone Encounter (Signed)
Pt is interested in PAF. Will mail papers to him today.

## 2020-12-15 NOTE — Telephone Encounter (Signed)
Looking into [place] , to see if this is something the patient would like to apply for.

## 2020-12-15 NOTE — Telephone Encounter (Signed)
Patient called and stated that he was prescribed the medication Jardiance. Patient said that the medication is over he cannot afford the medication so he will not be getting it

## 2021-01-08 NOTE — Progress Notes (Addendum)
ID:  Ryan Garner, DOB Oct 06, 1945, MRN [id number]  PCP:  [name], [name], MD  Cardiologist:   Dorris Carnes, MD   Pt presents for follow up of CHF  History of Present Illness: Ryan Garner is a 75 y.o. male with a history of HTN, HL, pre DM, morbid obesity, atrial fibrillation and CAD    I  saw him in consult in Aug 2021.  He was admitted to [facility] on 04/30/20 with CP and STEMI.  Underwent cath with PTCA/DES of LCx    Flecanide stopped  He was seen by Beckie Salts (EP  plan for rate control and anticoagulatoin  Not a candidate for Tikosyn.  Amiodarone alos not felt to be a good choice.    I saw the pt again at the end of Dec 2021   He complained of severe SOB  Hard time walking when I saw him   Wheezing on exam, marked volume overload   I recomm admission to [facility] for IV diuresis   parking lot   Notes mild chest pressure  Notes wheezing.     The pt was admitted and underwent signif IV diuresis    He says he is now waying himself every day   Told if wt goes up 3 lbs then he is to take 5 Zaroxylyn   He has not done this yet  I saw the pt in clinic in Jan 2022  Since I saw him he says he is watching salt intake    Breathing is stable   He still gets SOB/ fatigues as he did in Jan  Much better than in Dec   Still wonders if it is afib He denies CP    Current Meds  Medication Sig   apixaban (ELIQUIS) 5 MG TABS tablet Take 1 tablet (5 mg total) by mouth 2 (two) times daily.   aspirin 81 MG EC tablet TAKE 1 TABLET (81 MG TOTAL) BY MOUTH DAILY. SWALLOW WHOLE.   atorvastatin (LIPITOR) 80 MG tablet Take 1 tablet (80 mg total) by mouth daily.   benzonatate (TESSALON PERLES) 100 MG capsule Take 1 capsule (100 mg total) by mouth 3 (three) times daily as needed for cough.   cefdinir (OMNICEF) 300 MG capsule Take 1 capsule (300 mg total) by mouth 2 (two) times daily. 1 po BID   clopidogrel (PLAVIX) 75 MG tablet Take 1 tablet (75 mg total) by mouth daily with breakfast.   empagliflozin (JARDIANCE) 10  MG TABS tablet Take 1 tablet (10 mg total) by mouth daily before breakfast.   fluticasone (FLONASE) 50 MCG/ACT nasal spray Place 2 sprays into both nostrils daily.   furosemide (LASIX) 40 MG tablet Take 2 tablets by mouth once daily   lisinopril (ZESTRIL) 2.5 MG tablet Take 1 tablet (2.5 mg total) by mouth daily.   metolazone (ZAROXOLYN) 5 MG tablet Take 1 tablet (5 mg total) by mouth as needed (dypsnea or weight up 3 lbs).   metoprolol tartrate (LOPRESSOR) 25 MG tablet Take 1.5 tablets (37.5 mg total) by mouth 2 (two) times daily.   Misc Natural Products (PROSTATE SUPPORT) 300-15 MG TABS Take 1 tablet by mouth daily. Super Beta Prostate   Multiple Vitamin (MULTIVITAMIN WITH MINERALS) TABS tablet Take 1 tablet by mouth daily.   nitroGLYCERIN (NITROSTAT) 0.4 MG SL tablet Place 1 tablet (0.4 mg total) under the tongue every 5 (five) minutes x 3 doses as needed for chest pain.   nitroGLYCERIN (NITROSTAT) 0.4 MG SL tablet  PLACE 1 TABLET (0.4 MG TOTAL) UNDER THE TONGUE EVERY FIVE MINUTES X 3 DOSES AS NEEDED FOR CHEST PAIN.   potassium chloride SA (KLOR-CON) 20 MEQ tablet Take 1 tablet (20 mEq total) by mouth daily.     Allergies:   Flecainide and Rofecoxib   Past Medical History:  Diagnosis Date   A-fib ([place])    Acute on chronic diastolic CHF (congestive heart failure) ([place]) 07/21/2020   Hyperlipidemia    Hypertension    Metabolic syndrome    Prediabetes    STEMI (ST elevation myocardial infarction) [facility])     Past Surgical History:  Procedure Laterality Date   APPENDECTOMY     CHOLECYSTECTOMY N/A 03/02/2020   Procedure: LAPAROSCOPIC CHOLECYSTECTOMY;  Surgeon: Aviva Signs, MD;  Location: [facility];  Service: General;  Laterality: N/A;   CORONARY STENT INTERVENTION N/A 04/30/2020   Procedure: CORONARY STENT INTERVENTION;  Surgeon: Martinique, Peter M, MD;  Location: [facility];  Service: Cardiovascular;  Laterality: N/A;   CORONARY/GRAFT ACUTE MI REVASCULARIZATION N/A 04/30/2020    Procedure: Coronary/Graft Acute MI Revascularization;  Surgeon: Martinique, Peter M, MD;  Location: [facility];  Service: Cardiovascular;  Laterality: N/A;   Eyelid Surgery Bilateral    HERNIA REPAIR     knee tendons repair     LEFT HEART CATH AND CORONARY ANGIOGRAPHY N/A 04/30/2020   Procedure: LEFT HEART CATH AND CORONARY ANGIOGRAPHY;  Surgeon: Martinique, Peter M, MD;  Location: [facility];  Service: Cardiovascular;  Laterality: N/A;   ROTATOR CUFF REPAIR Bilateral    TONSILLECTOMY       Social History:  The patient  reports that he quit smoking about 61 years ago. His smoking use included cigarettes. He has never used smokeless tobacco. He reports that he does not drink alcohol and does not use drugs.   Family History:  The patient's family history includes Arthritis in his father; Cancer in his mother; Coronary artery disease in his mother; Diabetes in his brother and maternal grandmother; Heart attack in his brother.    ROS:  Please see the history of present illness. All other systems are reviewed and  Negative to the above problem except as noted.    PHYSICAL EXAM: VS:  BP 114/74   Pulse 67   Ht 5' 7.5" (1.715 m)   Wt 296 lb 9.6 oz (134.5 kg)   SpO2 97%   BMI 45.77 kg/m   GEN: Morbidly obese 75 yo in no acute distress  HEENT: normal  Neck: JVP is not elevated   Cardiac: Irreg irreg ; no murmurs   1+ LE edema (wearing support hose) Respiratory:  Clear to auscultation GI: soft, obese  Nontender  MS: no deformity Moving all extremities   Skin: warm and dry, no rash Neuro:  Strength and sensation are intact Psych: euthymic mood, full affect   EKG:  EKG is ordered today.   Atrial fibrillation   67 bpm   T wve inversion III     Cardiac Cath 10/9    Cath: 04/30/20   Ramus lesion is 75% stenosed. Mid Cx lesion is 99% stenosed. Post intervention, there is a 0% residual stenosis. A drug-eluting stent was successfully placed using a STENT RESOLUTE ONYX 2.5X34. The  left ventricular systolic function is normal. LV end diastolic pressure is mildly elevated. The left ventricular ejection fraction is 55-65% by visual estimate.   1. 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 Good LV systolic function 3.  Elevated LVEDP 4. Successful PCI of the mid to distal LCx with DES x 1.   Plan: ASA 81 mg daily for one month. Plavix 75 mg daily for one year. May resume Eliquis tomorrow. IV diuresis. Will need to stop Flecainide. Consider alternative AAD therapy.   ECHO   05/01/20  1. Anterior and anterolateral hypokinesis. Left ventricular ejection fraction, by estimation, is 60 to 65%. The left ventricle has normal function. The left ventricle has no regional wall motion abnormalities. Left ventricular diastolic parameters are indeterminate. 2. Right ventricular systolic function is normal. The right ventricular size is normal. There is severely elevated pulmonary artery systolic pressure. 3. Left atrial size was severely dilated. 4. The mitral valve is normal in structure. Mild to moderate mitral valve regurgitation. No evidence of mitral stenosis. 5. The aortic valve is calcified. There is moderate calcification of the aortic valve. There is moderate thickening of the aortic valve. Aortic valve regurgitation is mild. No aortic stenosis is present. Aortic valve area, by VTI measures 1.04 cm. Aortic valve mean gradient measures 15.2 mmHg. Aortic valve Vmax measures 2.70 m/s. 6. Aortic dilatation noted. There is mild dilatation of the ascending aorta, measuring 38 mm. 7. The inferior vena cava is dilated in size with >50% respiratory variability, suggesting right atrial pressure of 8 mmHg.  Lipid Panel    Component Value Date/Time   CHOL 106 10/27/2020 1121   TRIG 65 10/27/2020 1121   TRIG 74 08/27/2014 0826   HDL 41 10/27/2020 1121   HDL 56 08/27/2014 0826   CHOLHDL 2.6 10/27/2020 1121   CHOLHDL 4.1 05/01/2020 1759   VLDL 13  05/01/2020 1759   LDLCALC 51 10/27/2020 1121   LDLCALC 78 11/13/2013 0911      Wt Readings from Last 3 Encounters:  01/09/21 296 lb 9.6 oz (134.5 kg)  10/27/20 299 lb (135.6 kg)  10/12/20 289 lb 0.4 oz (131.1 kg)      ASSESSMENT AND PLAN: 1  Chronic diastolic CHF   Volume remains relatively stable I think from Jan  Difficult given size to accurately assess Continue to watch Na.  Continue to weigh regularly    2  CAD   Pt with intervention to LCx in October   REmains without symptoms of angina    2  Atrial fibrillation   Pt is rate control in afib  On Eliquis    Need to review records   Question if he would feel better in SR with rhythm control, not rate control   He was not felt to be a candidate for Tikosyn or for amiodarone   Will review   3  HL  Keep on atorvastatin.  Last LDL in APril was 51     F/U in winter     Current medicines are reviewed at length with the patient today.  The patient does not have concerns regarding medicines.  Signed, Dorris Carnes, MD  01/09/2021 11:45 PM    [address], [place], [place]  [address] Phone: [phone]; Fax: [phone]

## 2021-01-09 ENCOUNTER — Encounter: Payer: Self-pay | Admitting: Internal Medicine

## 2021-01-09 ENCOUNTER — Other Ambulatory Visit (HOSPITAL_COMMUNITY)
Admission: RE | Admit: 2021-01-09 | Discharge: 2021-01-09 | Disposition: A | Discharge status: Home / Self Care | Payer: Medicare Other | Source: Ambulatory Visit | Attending: Internal Medicine | Admitting: Internal Medicine

## 2021-01-09 ENCOUNTER — Other Ambulatory Visit: Payer: Self-pay

## 2021-01-09 ENCOUNTER — Ambulatory Visit (INDEPENDENT_AMBULATORY_CARE_PROVIDER_SITE_OTHER): Payer: Medicare Other | Admitting: Internal Medicine

## 2021-01-09 VITALS — BP 114/74 | HR 67 | Ht 67.5 in | Wt 296.6 lb

## 2021-01-09 DIAGNOSIS — I2121 ST elevation (STEMI) myocardial infarction involving left circumflex coronary artery: Secondary | ICD-10-CM

## 2021-01-09 DIAGNOSIS — I10 Essential (primary) hypertension: Secondary | ICD-10-CM | POA: Insufficient documentation

## 2021-01-09 DIAGNOSIS — Z79899 Other long term (current) drug therapy: Secondary | ICD-10-CM | POA: Insufficient documentation

## 2021-01-09 DIAGNOSIS — I5042 Chronic combined systolic (congestive) and diastolic (congestive) heart failure: Secondary | ICD-10-CM

## 2021-01-09 DIAGNOSIS — I4891 Unspecified atrial fibrillation: Secondary | ICD-10-CM | POA: Diagnosis not present

## 2021-01-09 LAB — CBC WITH DIFFERENTIAL/PLATELET
Abs Immature Granulocytes: 0.05 10*3/uL (ref 0.00–0.07)
Basophils Absolute: 0 10*3/uL (ref 0.0–0.1)
Basophils Relative: 0 %
Eosinophils Absolute: 0.1 10*3/uL (ref 0.0–0.5)
Eosinophils Relative: 1 %
HCT: 40.9 % (ref 39.0–52.0)
Hemoglobin: 13.3 g/dL (ref 13.0–17.0)
Immature Granulocytes: 1 %
Lymphocytes Relative: 18 %
Lymphs Abs: 1.8 10*3/uL (ref 0.7–4.0)
MCH: 29.6 pg (ref 26.0–34.0)
MCHC: 32.5 g/dL (ref 30.0–36.0)
MCV: 91.1 fL (ref 80.0–100.0)
Monocytes Absolute: 0.9 10*3/uL (ref 0.1–1.0)
Monocytes Relative: 10 %
Neutro Abs: 6.8 10*3/uL (ref 1.7–7.7)
Neutrophils Relative %: 70 %
Platelets: 261 10*3/uL (ref 150–400)
RBC: 4.49 MIL/uL (ref 4.22–5.81)
RDW: 13.7 % (ref 11.5–15.5)
WBC: 9.6 10*3/uL (ref 4.0–10.5)
nRBC: 0 % (ref 0.0–0.2)

## 2021-01-09 LAB — BASIC METABOLIC PANEL
Anion gap: 9 (ref 5–15)
BUN: 33 mg/dL — ABNORMAL HIGH (ref 8–23)
CO2: 29 mmol/L (ref 22–32)
Calcium: 9.3 mg/dL (ref 8.9–10.3)
Chloride: 96 mmol/L — ABNORMAL LOW (ref 98–111)
Creatinine, Ser: 1.2 mg/dL (ref 0.61–1.24)
GFR, Estimated: >60 mL/min (ref >60)
Glucose, Bld: 119 mg/dL — ABNORMAL HIGH (ref 70–99)
Potassium: 3.7 mmol/L (ref 3.5–5.1)
Sodium: 134 mmol/L — ABNORMAL LOW (ref 135–145)

## 2021-01-09 LAB — BRAIN NATRIURETIC PEPTIDE: B Natriuretic Peptide: 259 pg/mL — ABNORMAL HIGH (ref 0.0–100.0)

## 2021-01-09 NOTE — Patient Instructions (Signed)
Medication Instructions:  Your physician recommends that you continue on your current medications as directed. Please refer to the Current Medication list given to you today.   *If you need a refill on your cardiac medications before your next appointment, please call your pharmacy*   Lab Work: Your physician recommends that you return for lab work in: Today ( CBC, Bmet, BNP)   If you have labs (blood work) drawn today and your tests are completely normal, you will receive your results only by: MyChart Message (if you have [place]) OR A paper copy in the mail If you have any lab test that is abnormal or we need to change your treatment, we will call you to review the results.   Testing/Procedures: NONE    Follow-Up: At [facility], you and your health needs are our priority.  As part of our continuing mission to provide you with exceptional heart care, we have created designated Provider Care Teams.  These Care Teams include your primary Cardiologist (physician) and Advanced Practice Providers (APPs -  Physician Assistants and Nurse Practitioners) who all work together to provide you with the care you need, when you need it.  We recommend signing up for the patient portal called "MyChart".  Sign up information is provided on this After Visit Summary.  MyChart is used to connect with patients for Virtual Visits (Telemedicine).  Patients are able to view lab/test results, encounter notes, upcoming appointments, etc.  Non-urgent messages can be sent to your provider as well.   To learn more about what you can do with MyChart, go to [email or web address].    Your next appointment:    Pending Test Results   The format for your next appointment:   In Person  Provider:   Dorris Carnes, MD   Other Instructions Thank you for choosing [facility]!

## 2021-01-30 ENCOUNTER — Ambulatory Visit (INDEPENDENT_AMBULATORY_CARE_PROVIDER_SITE_OTHER): Payer: Medicare Other | Admitting: Licensed Clinical Social Worker

## 2021-01-30 DIAGNOSIS — I10 Essential (primary) hypertension: Secondary | ICD-10-CM

## 2021-01-30 DIAGNOSIS — E782 Mixed hyperlipidemia: Secondary | ICD-10-CM | POA: Diagnosis not present

## 2021-01-30 DIAGNOSIS — I4819 Other persistent atrial fibrillation: Secondary | ICD-10-CM | POA: Diagnosis not present

## 2021-01-30 DIAGNOSIS — E1169 Type 2 diabetes mellitus with other specified complication: Secondary | ICD-10-CM | POA: Diagnosis not present

## 2021-01-30 DIAGNOSIS — I712 Thoracic aortic aneurysm, without rupture: Secondary | ICD-10-CM

## 2021-01-30 DIAGNOSIS — I7121 Aneurysm of the ascending aorta, without rupture: Secondary | ICD-10-CM

## 2021-01-30 NOTE — Patient Instructions (Signed)
Visit Information  PATIENT GOALS:  Goals Addressed             This Visit's Progress    Manage My Emotions; Manage mobility needs. Complete ADLs as able       Timeframe:  Short-Term Goal Priority:  Medium Progress:  On Track Start Date:              01/30/21               Expected End Date:            04/20/21           Follow Up Date 03/09/21   Manage Emotions; Manage mobility needs; Complete ADLs as able    Why is this important?   When you are stressed, down or upset, your body reacts too.  For example, your blood pressure may get higher; you may have a headache or stomachache.  When your emotions get the best of you, your body's ability to fight off cold and flu gets weak.  These steps will help you manage your emotions.     Patient Coping Skills: Completes ADLs daily as able Attends scheduled medical appointments Has support from his wife, [name]  Patient Deficits  Mobility challenges Some pain issues  Patient Goals:  In next 30 days, patient will: Attend scheduled medical appointments Talk with RNCM or LCSW about CCM program support Talk with LCSW about mobility issues of client -  Follow Up Plan: LCSW to call client on 03/09/21     Norva Riffle.Izyan Ezzell MSW, LCSW Licensed Clinical Social Worker [facility] Care Management [phone]

## 2021-01-30 NOTE — Progress Notes (Signed)
Reviewed chart   I would keep on same medicines for now     Keep watching salt intake, keep tabs on weight daily    Recomm follow up in November;   Sooner for problems
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
## 2021-02-02 ENCOUNTER — Other Ambulatory Visit: Payer: Self-pay | Admitting: *Deleted

## 2021-02-02 MED ORDER — METOLAZONE 5 MG PO TABS: 5.0000 mg | ORAL_TABLET | ORAL | 1 refills | Status: DC | PRN

## 2021-02-16 DIAGNOSIS — L819 Disorder of pigmentation, unspecified: Secondary | ICD-10-CM | POA: Diagnosis not present

## 2021-02-16 DIAGNOSIS — D692 Other nonthrombocytopenic purpura: Secondary | ICD-10-CM | POA: Diagnosis not present

## 2021-02-16 DIAGNOSIS — L570 Actinic keratosis: Secondary | ICD-10-CM | POA: Diagnosis not present

## 2021-02-16 DIAGNOSIS — L218 Other seborrheic dermatitis: Secondary | ICD-10-CM | POA: Diagnosis not present

## 2021-03-09 ENCOUNTER — Telehealth: Payer: Medicare Other

## 2021-03-23 ENCOUNTER — Ambulatory Visit (INDEPENDENT_AMBULATORY_CARE_PROVIDER_SITE_OTHER): Payer: Medicare Other | Admitting: Family Medicine

## 2021-03-23 ENCOUNTER — Encounter: Payer: Self-pay | Admitting: Family Medicine

## 2021-03-23 ENCOUNTER — Other Ambulatory Visit: Payer: Self-pay

## 2021-03-23 VITALS — BP 119/70 | HR 77 | Temp 97.9°F | Ht 67.0 in | Wt 295.0 lb

## 2021-03-23 DIAGNOSIS — L237 Allergic contact dermatitis due to plants, except food: Secondary | ICD-10-CM

## 2021-03-23 MED ORDER — TRIAMCINOLONE ACETONIDE 0.1 % EX CREA: 1.0000 "application " | TOPICAL_CREAM | Freq: Two times a day (BID) | CUTANEOUS | 0 refills | Status: DC

## 2021-03-23 MED ORDER — METHYLPREDNISOLONE ACETATE 40 MG/ML IJ SUSP
40.0000 mg | Freq: Once | INTRAMUSCULAR | Status: AC
  Administered 2021-03-23: 40 mg via INTRAMUSCULAR

## 2021-03-23 NOTE — Progress Notes (Signed)
Subjective:     Ryan Garner is a 75 y.o. male who presents for evaluation of a rash involving the hand, lower extremity, trunk, and upper extremity. Rash started 7 days ago. Lesions are pink and to red, and  vesicular  in texture. Rash has changed over time. Rash is pruritic. Associated symptoms: none. Patient denies: abdominal pain, arthralgia, congestion, cough, crankiness, decrease in appetite, decrease in energy level, fever, headache, irritability, myalgia, nausea, sore throat, and vomiting. Patient has not had contacts with similar rash. Patient has had new exposures (soaps, lotions, laundry detergents, foods, medications, plants, insects or animals).  The following portions of the patient's history were reviewed and updated as appropriate: allergies, current medications, past family history, past medical history, past social history, past surgical history, and problem list.  Review of Systems A comprehensive review of systems was negative except for: Integument/breast: positive for pruritus, rash, skin color change, and skin lesion(s)    Objective:    BP 119/70   Pulse 77   Temp 97.9 F (36.6 C) (Temporal)   Ht '5\' 7"'$  (1.702 m)   Wt 295 lb (133.8 kg)   SpO2 97%   BMI 46.20 kg/m  Physical Exam Nursing note reviewed. Exam conducted with a chaperone present.  Constitutional:      Appearance: Normal appearance. He is morbidly obese.  HENT:     Head: Normocephalic and atraumatic.     Nose: Nose normal. No congestion.     Mouth/Throat:     Mouth: Mucous membranes are moist.     Pharynx: Oropharynx is clear. No oropharyngeal exudate or posterior oropharyngeal erythema.  Eyes:     Conjunctiva/sclera: Conjunctivae normal.     Pupils: Pupils are equal, round, and reactive to light.  Cardiovascular:     Rate and Rhythm: Normal rate. Rhythm irregularly irregular.  Pulmonary:     Effort: Pulmonary effort is normal. No respiratory distress.     Breath sounds: Normal breath  sounds. No stridor. No wheezing, rhonchi or rales.  Musculoskeletal:     Cervical back: Normal range of motion and neck supple.  Skin:    General: Skin is warm and dry.     Findings: Erythema and rash present. Rash is vesicular (rash to bilateral forearms, upper back, and bilateral legs).  Neurological:     General: No focal deficit present.     Mental Status: He is alert and oriented to person, place, and time.  Psychiatric:        Mood and Affect: Mood normal.        Behavior: Behavior normal.        Thought Content: Thought content normal.        Judgment: Judgment normal.     Assessment and Plan:    Ryan Garner was seen today for rash.  Diagnoses and all orders for this visit:  Poison ivy dermatitis Symptomatic care discussed in detail. Ongoing for over a week, will burst with steroids in office and provide steroid cream as prescribed. Pt aware to report any new, worsening, or persistent symptoms.  -     triamcinolone cream (KENALOG) 0.1 %; Apply 1 application topically 2 (two) times daily. -     methylPREDNISolone acetate (DEPO-MEDROL) injection 40 mg  The above assessment and management plan was discussed with the patient. The patient verbalized understanding of and has agreed to the management plan. Patient is aware to call the clinic if they develop any new symptoms or if symptoms fail to improve  or worsen. Patient is aware when to return to the clinic for a follow-up visit. Patient educated on when it is appropriate to go to the emergency department.   Return if symptoms worsen or fail to improve.  Monia Pouch, FNP-C [facility] [address] [place], [place] [address] [phone]

## 2021-04-11 ENCOUNTER — Telehealth: Payer: Self-pay | Admitting: Internal Medicine

## 2021-04-11 NOTE — Telephone Encounter (Signed)
New message    Patient would like to know how Dr  Harrington Challenger would like to proceed with his AFIB, he is trying of feeling short of breath and nothing  helping with the AFIB, does not want to wait until November

## 2021-04-12 NOTE — Telephone Encounter (Signed)
Pt states that he is still SOB and tired from the AFib. He reports that he was told there may be a way to get him out of Afib and he would like to explore that option. States that Dr. Harrington Challenger told him that she would speak with Dr. Lovena Le in regards to this. Please advise.

## 2021-04-16 NOTE — Telephone Encounter (Signed)
I have reviewed with Dr Lovena Le Not a candidate for antiarrhythmics COuld try to slow heart rate some more    WOuld increase metoprolol to 50 mg bid    F/U in clinic in 6 to 8 wks

## 2021-04-17 MED ORDER — METOPROLOL TARTRATE 50 MG PO TABS: 50.0000 mg | ORAL_TABLET | Freq: Two times a day (BID) | ORAL | 3 refills | Status: DC

## 2021-04-17 NOTE — Telephone Encounter (Signed)
Pt notified and order placed 

## 2021-04-19 ENCOUNTER — Ambulatory Visit (INDEPENDENT_AMBULATORY_CARE_PROVIDER_SITE_OTHER): Payer: Medicare Other | Admitting: Licensed Clinical Social Worker

## 2021-04-19 DIAGNOSIS — I10 Essential (primary) hypertension: Secondary | ICD-10-CM

## 2021-04-19 DIAGNOSIS — I7121 Aneurysm of the ascending aorta, without rupture: Secondary | ICD-10-CM

## 2021-04-19 DIAGNOSIS — E782 Mixed hyperlipidemia: Secondary | ICD-10-CM

## 2021-04-19 DIAGNOSIS — I712 Thoracic aortic aneurysm, without rupture: Secondary | ICD-10-CM

## 2021-04-19 DIAGNOSIS — I4819 Other persistent atrial fibrillation: Secondary | ICD-10-CM

## 2021-04-19 DIAGNOSIS — E1169 Type 2 diabetes mellitus with other specified complication: Secondary | ICD-10-CM

## 2021-04-19 NOTE — Patient Instructions (Signed)
Visit Information  PATIENT GOALS:  Goals Addressed             This Visit's Progress    Manage My Emotions; Manage mobility needs. Complete ADLs as able       Timeframe:  Short-Term Goal Priority:  Medium Progress:  On Track Start Date:              04/19/21               Expected End Date:            07/12/21           Follow Up Date 06/05/21 at 9:30 AM   Manage Emotions; Manage mobility needs; Complete ADLs as able    Why is this important?   When you are stressed, down or upset, your body reacts too.  For example, your blood pressure may get higher; you may have a headache or stomachache.  When your emotions get the best of you, your body's ability to fight off cold and flu gets weak.  These steps will help you manage your emotions.     Patient Coping Skills: Completes ADLs daily as able Attends scheduled medical appointments Has support from his wife, [name]  Patient Deficits  Mobility challenges Some pain issues  Patient Goals:  In next 30 days, patient will: Attend scheduled medical appointments Talk with RNCM or LCSW about CCM program support Talk with LCSW about mobility issues of client -  Follow Up Plan: LCSW to call client on  06/05/21 at 9:30 AM to assess client needs at that time      Norva Riffle.Dariella Gillihan MSW, LCSW Licensed Clinical Social Worker [facility] Care Management [phone]
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
## 2021-04-21 DIAGNOSIS — E1169 Type 2 diabetes mellitus with other specified complication: Secondary | ICD-10-CM

## 2021-04-21 DIAGNOSIS — E782 Mixed hyperlipidemia: Secondary | ICD-10-CM | POA: Diagnosis not present

## 2021-04-21 DIAGNOSIS — I4819 Other persistent atrial fibrillation: Secondary | ICD-10-CM | POA: Diagnosis not present

## 2021-04-21 DIAGNOSIS — I10 Essential (primary) hypertension: Secondary | ICD-10-CM | POA: Diagnosis not present

## 2021-04-28 ENCOUNTER — Ambulatory Visit (INDEPENDENT_AMBULATORY_CARE_PROVIDER_SITE_OTHER): Payer: Medicare Other | Admitting: Family Medicine

## 2021-04-28 ENCOUNTER — Encounter: Payer: Self-pay | Admitting: Family Medicine

## 2021-04-28 ENCOUNTER — Other Ambulatory Visit: Payer: Self-pay

## 2021-04-28 VITALS — BP 121/85 | HR 67 | Temp 98.2°F | Ht 67.0 in | Wt 292.8 lb

## 2021-04-28 DIAGNOSIS — E1159 Type 2 diabetes mellitus with other circulatory complications: Secondary | ICD-10-CM

## 2021-04-28 DIAGNOSIS — I5042 Chronic combined systolic (congestive) and diastolic (congestive) heart failure: Secondary | ICD-10-CM | POA: Diagnosis not present

## 2021-04-28 DIAGNOSIS — E782 Mixed hyperlipidemia: Secondary | ICD-10-CM

## 2021-04-28 DIAGNOSIS — I10 Essential (primary) hypertension: Secondary | ICD-10-CM

## 2021-04-28 DIAGNOSIS — I2121 ST elevation (STEMI) myocardial infarction involving left circumflex coronary artery: Secondary | ICD-10-CM | POA: Diagnosis not present

## 2021-04-28 DIAGNOSIS — I2510 Atherosclerotic heart disease of native coronary artery without angina pectoris: Secondary | ICD-10-CM

## 2021-04-28 DIAGNOSIS — E1169 Type 2 diabetes mellitus with other specified complication: Secondary | ICD-10-CM

## 2021-04-28 LAB — BAYER DCA HB A1C WAIVED: HB A1C (BAYER DCA - WAIVED): 6.4 % — ABNORMAL HIGH (ref 4.8–5.6)

## 2021-04-28 MED ORDER — LISINOPRIL 2.5 MG PO TABS: 2.5000 mg | ORAL_TABLET | Freq: Every day | ORAL | 3 refills | Status: DC

## 2021-04-28 NOTE — Progress Notes (Signed)
BP 121/85   Pulse 67   Temp 98.2 F (36.8 C)   Ht 5\' 7"  (1.702 m)   Wt 292 lb 12.8 oz (132.8 kg)   SpO2 95%   BMI 45.86 kg/m    Subjective:   Patient ID: Ryan Garner, male    DOB: [birth year], 75 y.o.   MRN: [id number]  HPI: Ryan Garner is a 75 y.o. male presenting on 04/28/2021 for Diabetes   HPI Type 2 diabetes mellitus Patient comes in today for recheck of his diabetes. Patient has been currently taking Jardiance.  Patient is currently on an ACE inhibitor/ARB. Patient has not seen an ophthalmologist this year. Patient denies any issues with their feet. The symptom started onset as an adult hypertension and hyperlipidemia and CHF and CAD ARE RELATED TO DM   Hypertension CAD and CHF Patient is currently on furosemide and metolazone and metoprolol although he still is not tolerate the metoprolol and stopped it.  He is also on Plavix and Eliquis and lisinopril, and their blood pressure today is 121/85. Patient denies any lightheadedness or dizziness. Patient denies headaches, blurred vision, chest pains, shortness of breath, or weakness. Denies any side effects from medication and is content with current medication.  He does feel short of breath on exertion and feels like his energy does not come back.  He is also recently diagnosed with A. fib  Hyperlipidemia Patient is coming in for recheck of his hyperlipidemia. The patient is currently taking Lipitor. They deny any issues with myalgias or history of liver damage from it. They deny any focal numbness or weakness or chest pain.   Relevant past medical, surgical, family and social history reviewed and updated as indicated. Interim medical history since our last visit reviewed. Allergies and medications reviewed and updated.  Review of Systems  Constitutional:  Negative for chills and fever.  Respiratory:  Positive for shortness of breath. Negative for chest tightness and wheezing.   Cardiovascular:  Negative for chest pain,  palpitations and leg swelling.  Musculoskeletal:  Negative for back pain and gait problem.  Skin:  Negative for rash.  All other systems reviewed and are negative.  Per HPI unless specifically indicated above   Allergies as of 04/28/2021       Reactions   Flecainide    Per patient dizziness, shortness of breath, blurred vision   Rofecoxib Other (See Comments)   (Vioxx)        Medication List        Accurate as of April 28, 2021  8:18 AM. If you have any questions, ask your nurse or doctor.          STOP taking these medications    benzonatate 100 MG capsule Commonly known as: Best boy Stopped by: [name] Riannah Stagner, MD   cefdinir 300 MG capsule Commonly known as: OMNICEF Stopped by: [name] Eitan Doubleday, MD       TAKE these medications    apixaban 5 MG Tabs tablet Commonly known as: ELIQUIS Take 1 tablet (5 mg total) by mouth 2 (two) times daily.   Aspirin Low Dose 81 MG EC tablet Generic drug: aspirin TAKE 1 TABLET (81 MG TOTAL) BY MOUTH DAILY. SWALLOW WHOLE.   atorvastatin 80 MG tablet Commonly known as: LIPITOR Take 1 tablet (80 mg total) by mouth daily.   clopidogrel 75 MG tablet Commonly known as: PLAVIX Take 1 tablet (75 mg total) by mouth daily with breakfast.   empagliflozin 10 MG Tabs tablet  Commonly known as: Jardiance Take 1 tablet (10 mg total) by mouth daily before breakfast.   fluticasone 50 MCG/ACT nasal spray Commonly known as: FLONASE Place 2 sprays into both nostrils daily.   furosemide 40 MG tablet Commonly known as: LASIX Take 2 tablets by mouth once daily   lisinopril 2.5 MG tablet Commonly known as: Zestril Take 1 tablet (2.5 mg total) by mouth daily.   metolazone 5 MG tablet Commonly known as: ZAROXOLYN Take 1 tablet (5 mg total) by mouth as needed (dypsnea or weight up 3 lbs).   metoprolol tartrate 50 MG tablet Commonly known as: LOPRESSOR Take 1 tablet (50 mg total) by mouth 2 (two) times daily.    multivitamin with minerals Tabs tablet Take 1 tablet by mouth daily.   nitroGLYCERIN 0.4 MG SL tablet Commonly known as: NITROSTAT Place 1 tablet (0.4 mg total) under the tongue every 5 (five) minutes x 3 doses as needed for chest pain. What changed: Another medication with the same name was removed. Continue taking this medication, and follow the directions you see here. Changed by: [name] Timisha Mondry, MD   potassium chloride SA 20 MEQ tablet Commonly known as: KLOR-CON Take 1 tablet (20 mEq total) by mouth daily.   Prostate Support 300-15 MG Tabs Take 1 tablet by mouth daily. Super Beta Prostate   triamcinolone cream 0.1 % Commonly known as: KENALOG Apply 1 application topically 2 (two) times daily.         Objective:   BP 121/85   Pulse 67   Temp 98.2 F (36.8 C)   Ht 5\' 7"  (1.702 m)   Wt 292 lb 12.8 oz (132.8 kg)   SpO2 95%   BMI 45.86 kg/m   Wt Readings from Last 3 Encounters:  04/28/21 292 lb 12.8 oz (132.8 kg)  03/23/21 295 lb (133.8 kg)  01/09/21 296 lb 9.6 oz (134.5 kg)    Physical Exam Vitals and nursing note reviewed.  Constitutional:      General: He is not in acute distress.    Appearance: He is well-developed. He is not diaphoretic.  Eyes:     General: No scleral icterus.    Conjunctiva/sclera: Conjunctivae normal.  Neck:     Thyroid: No thyromegaly.  Cardiovascular:     Rate and Rhythm: Normal rate. Rhythm irregular.     Heart sounds: Normal heart sounds. No murmur heard. Pulmonary:     Effort: Pulmonary effort is normal. No respiratory distress.     Breath sounds: Normal breath sounds. No wheezing.  Musculoskeletal:        General: Normal range of motion.     Cervical back: Neck supple.  Lymphadenopathy:     Cervical: No cervical adenopathy.  Skin:    General: Skin is warm and dry.     Findings: No rash.  Neurological:     Mental Status: He is alert and oriented to person, place, and time.     Coordination: Coordination normal.   Psychiatric:        Behavior: Behavior normal.      Assessment & Plan:   Problem List Items Addressed This Visit       Cardiovascular and Mediastinum   Hypertension   Relevant Medications   lisinopril (ZESTRIL) 2.5 MG tablet   Coronary artery disease due to type 2 diabetes mellitus (HCC)   Relevant Medications   lisinopril (ZESTRIL) 2.5 MG tablet   CHF (congestive heart failure) (HCC)   Relevant Medications   lisinopril (ZESTRIL) 2.5  MG tablet     Endocrine   Type 2 diabetes mellitus (HCC) - Primary   Relevant Medications   lisinopril (ZESTRIL) 2.5 MG tablet   Other Relevant Orders   Bayer DCA Hb A1c Waived     Other   Hyperlipidemia   Relevant Medications   lisinopril (ZESTRIL) 2.5 MG tablet  Continue current medicines.  Continue to follow-up with cardiology.  He did not tolerate metoprolol but discussed that he needs to talk to cardiology about this.  Follow up plan: Return in about 3 months (around 07/29/2021), or if symptoms worsen or fail to improve, for Hypertension and diabetes and hyperlipidemia recheck.  Counseling provided for all of the vaccine components Orders Placed This Encounter  Procedures   Bayer [place] Hb A1c [place] Rease Wence, MD [facility] 04/28/2021, 8:18 AM

## 2021-05-28 NOTE — Progress Notes (Addendum)
ID:  Ryan Garner, DOB 1946/05/09, MRN [id number]  PCP:  [name], [name], MD  Cardiologist:   Dorris Carnes, MD   Pt presents for follow up of CHF  History of Present Illness: Ryan Garner is a 75 y.o. male with a history of HTN, HL, pre DM, morbid obesity, atrial fibrillation and CAD    I  saw him in consult in Aug 2021.  He was admitted to [facility] on 04/30/20 with CP and STEMI.  Underwent cath with PTCA/DES of LCx    Flecanide stopped  He was seen by Beckie Salts (EP  plan for rate control and anticoagulatoin  Not a candidate for Tikosyn.  Amiodarone alos not felt to be a good choice.    I saw the pt again at the end of Dec 2021   He complained of severe SOB  Hard time walking when I saw him   Wheezing on exam, marked volume overload    The pt was admitted and underwent signif IV diuresis    He says he is now waying himself every day   Told if wt goes up 3 lbs then he is to take 5 Zaroxylyn   He has not done this yet  I last saw the pt in JUne 2022   Discussed salt intake  +65 Since seen the patient has done fair.  He is tired he says of being in atrial fibrillation.  He has quite he can be back in sinus rhythm.  He does get short of breath with activities sometimes gasping for air.  He is watching his weight though he says he has not been perfect with his diet.  He is taking Lasix and Zaroxolyn he takes 1-2 times per week.  He denies chest pain.  No severe palpitations.  The patient took metoprolol but did not feel good on it felt very sluggish has stopped.  He is also stopped lisinopril Current Meds  Medication Sig   apixaban (ELIQUIS) 5 MG TABS tablet Take 1 tablet (5 mg total) by mouth 2 (two) times daily.   atorvastatin (LIPITOR) 80 MG tablet Take 1 tablet (80 mg total) by mouth daily.   clopidogrel (PLAVIX) 75 MG tablet Take 1 tablet (75 mg total) by mouth daily with breakfast.   empagliflozin (JARDIANCE) 10 MG TABS tablet Take 1 tablet (10 mg total) by mouth daily before  breakfast.   fluticasone (FLONASE) 50 MCG/ACT nasal spray Place 2 sprays into both nostrils daily.   furosemide (LASIX) 40 MG tablet Take 2 tablets by mouth once daily   metolazone (ZAROXOLYN) 5 MG tablet Take 1 tablet (5 mg total) by mouth as needed (dypsnea or weight up 3 lbs).   Misc Natural Products (PROSTATE SUPPORT) 300-15 MG TABS Take 1 tablet by mouth daily. Super Beta Prostate   Multiple Vitamin (MULTIVITAMIN WITH MINERALS) TABS tablet Take 1 tablet by mouth daily.   nitroGLYCERIN (NITROSTAT) 0.4 MG SL tablet Place 1 tablet (0.4 mg total) under the tongue every 5 (five) minutes x 3 doses as needed for chest pain.   potassium chloride SA (KLOR-CON) 20 MEQ tablet Take 1 tablet (20 mEq total) by mouth daily.     Allergies:   Flecainide and Rofecoxib   Past Medical History:  Diagnosis Date   A-fib ([place])    Acute on chronic diastolic CHF (congestive heart failure) ([place]) 07/21/2020   Hyperlipidemia    Hypertension    Metabolic syndrome    Prediabetes  STEMI (ST elevation myocardial infarction) [facility])     Past Surgical History:  Procedure Laterality Date   APPENDECTOMY     CHOLECYSTECTOMY N/A 03/02/2020   Procedure: LAPAROSCOPIC CHOLECYSTECTOMY;  Surgeon: Aviva Signs, MD;  Location: [facility];  Service: General;  Laterality: N/A;   CORONARY STENT INTERVENTION N/A 04/30/2020   Procedure: CORONARY STENT INTERVENTION;  Surgeon: Martinique, Peter M, MD;  Location: [place] CV LAB;  Service: Cardiovascular;  Laterality: N/A;   CORONARY/GRAFT ACUTE MI REVASCULARIZATION N/A 04/30/2020   Procedure: Coronary/Graft Acute MI Revascularization;  Surgeon: Martinique, Peter M, MD;  Location: [place] [facility];  Service: Cardiovascular;  Laterality: N/A;   Eyelid Surgery Bilateral    HERNIA REPAIR     knee tendons repair     LEFT HEART CATH AND CORONARY ANGIOGRAPHY N/A 04/30/2020   Procedure: LEFT HEART CATH AND CORONARY ANGIOGRAPHY;  Surgeon: Martinique, Peter M, MD;  Location: [place] CV [facility];   Service: Cardiovascular;  Laterality: N/A;   ROTATOR CUFF REPAIR Bilateral    TONSILLECTOMY       Social History:  The patient  reports that he quit smoking about 62 years ago. His smoking use included cigarettes. He has never used smokeless tobacco. He reports that he does not drink alcohol and does not use drugs.   Family History:  The patient's family history includes Arthritis in his father; Cancer in his mother; Coronary artery disease in his mother; Diabetes in his brother and maternal grandmother; Heart attack in his brother.    ROS:  Please see the history of present illness. All other systems are reviewed and  Negative to the above problem except as noted.    PHYSICAL EXAM: VS:  BP 140/80   Pulse 84   Ht 5\' 7"  (1.702 m)   Wt 298 lb 12.8 oz (135.5 kg)   SpO2 96%   BMI 46.80 kg/m   GEN: Morbidly obese 75 yo in no acute distress  HEENT: normal  Neck: Neck is full Cardiac: Irreg irreg ; no murmurs   1 to 2+ LE edema (wearing support hose) Respiratory:  Clear to auscultation GI: soft, obese  Nontender  MS: no deformity Moving all extremities   Skin: warm and dry, no rash Neuro:  Strength and sensation are intact Psych: euthymic mood, full affect   EKG:  EKG is not the echocardiogram right ordered today.    Cardiac Cath 10/9    Cath: 04/30/20   Ramus lesion is 75% stenosed. Mid Cx lesion is 99% stenosed. Post intervention, there is a 0% residual stenosis. A drug-eluting stent was successfully placed using a STENT RESOLUTE ONYX 2.5X34. The left ventricular systolic function is normal. LV end diastolic pressure is mildly elevated. The left ventricular ejection fraction is 55-65% by visual estimate.   1. 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 Good LV systolic function 3. Elevated LVEDP 4. Successful PCI of the mid to distal LCx with DES x 1.   Plan: ASA 81 mg daily for one month. Plavix 75 mg daily for one year. May resume Eliquis  tomorrow. IV diuresis. Will need to stop Flecainide. Consider alternative AAD therapy.   ECHO   05/01/20  1. Anterior and anterolateral hypokinesis. Left ventricular ejection fraction, by estimation, is 60 to 65%. The left ventricle has normal function. The left ventricle has no regional wall motion abnormalities. Left ventricular diastolic parameters are indeterminate. 2. Right ventricular systolic function is normal. The right ventricular size is normal. There  is severely elevated pulmonary artery systolic pressure. 3. Left atrial size was severely dilated. 4. The mitral valve is normal in structure. Mild to moderate mitral valve regurgitation. No evidence of mitral stenosis. 5. The aortic valve is calcified. There is moderate calcification of the aortic valve. There is moderate thickening of the aortic valve. Aortic valve regurgitation is mild. No aortic stenosis is present. Aortic valve area, by VTI measures 1.04 cm. Aortic valve mean gradient measures 15.2 mmHg. Aortic valve Vmax measures 2.70 m/s. 6. Aortic dilatation noted. There is mild dilatation of the ascending aorta, measuring 38 mm. 7. The inferior vena cava is dilated in size with >50% respiratory variability, suggesting right atrial pressure of 8 mmHg.  Lipid Panel    Component Value Date/Time   CHOL 106 10/27/2020 1121   TRIG 65 10/27/2020 1121   TRIG 74 08/27/2014 0826   HDL 41 10/27/2020 1121   HDL 56 08/27/2014 0826   CHOLHDL 2.6 10/27/2020 1121   CHOLHDL 4.1 05/01/2020 1759   VLDL 13 05/01/2020 1759   LDLCALC 51 10/27/2020 1121   LDLCALC 78 11/13/2013 0911      Wt Readings from Last 3 Encounters:  05/29/21 298 lb 12.8 oz (135.5 kg)  04/28/21 292 lb 12.8 oz (132.8 kg)  03/23/21 295 lb (133.8 kg)      ASSESSMENT AND PLAN: 1  Chronic diastolic CHF   volume is increased.  We will go ahead and check labs today may readjust them.  C 2.  Atrial fibrillation.  Patient has been on rate control,  though since  seen he has stopped his beta-blocker.  He has been  seen Beckie Salts about a year ago, not felt to be a candidate for Tikosyn.  Not felt to be a good candidate for amiodarone.  Not able to use flecainide.  Will refer to  J Allred.  Question ablation candidate. 2  CAD   patient is status post NSTEMI with intervention to the circumflex in October 2021.  I am not convinced of angina.  We will get echo.   4.  HL  Keep on atorvastatin.     F/U after he has been seen by EP.4   Current medicines are reviewed at length with the patient today.  The patient does not have concerns regarding medicines.  Signed, Dorris Carnes, MD  05/29/2021 9:31 AM    [address], [place], [place]  [address] Phone: [phone]; Fax: [phone]

## 2021-05-29 ENCOUNTER — Ambulatory Visit (INDEPENDENT_AMBULATORY_CARE_PROVIDER_SITE_OTHER): Payer: Medicare Other | Admitting: Internal Medicine

## 2021-05-29 ENCOUNTER — Other Ambulatory Visit (HOSPITAL_COMMUNITY)
Admission: RE | Admit: 2021-05-29 | Discharge: 2021-05-29 | Disposition: A | Discharge status: Home / Self Care | Payer: Medicare Other | Source: Ambulatory Visit | Attending: Internal Medicine | Admitting: Internal Medicine

## 2021-05-29 ENCOUNTER — Encounter: Payer: Self-pay | Admitting: Internal Medicine

## 2021-05-29 ENCOUNTER — Other Ambulatory Visit: Payer: Self-pay

## 2021-05-29 VITALS — BP 140/80 | HR 84 | Ht 67.0 in | Wt 298.8 lb

## 2021-05-29 DIAGNOSIS — Z79899 Other long term (current) drug therapy: Secondary | ICD-10-CM | POA: Diagnosis not present

## 2021-05-29 DIAGNOSIS — R609 Edema, unspecified: Secondary | ICD-10-CM

## 2021-05-29 DIAGNOSIS — R0602 Shortness of breath: Secondary | ICD-10-CM | POA: Diagnosis not present

## 2021-05-29 DIAGNOSIS — I2121 ST elevation (STEMI) myocardial infarction involving left circumflex coronary artery: Secondary | ICD-10-CM | POA: Diagnosis not present

## 2021-05-29 LAB — BASIC METABOLIC PANEL
Anion gap: 8 (ref 5–15)
BUN: 34 mg/dL — ABNORMAL HIGH (ref 8–23)
CO2: 26 mmol/L (ref 22–32)
Calcium: 8.9 mg/dL (ref 8.9–10.3)
Chloride: 101 mmol/L (ref 98–111)
Creatinine, Ser: 1.07 mg/dL (ref 0.61–1.24)
GFR, Estimated: >60 mL/min (ref >60)
Glucose, Bld: 104 mg/dL — ABNORMAL HIGH (ref 70–99)
Potassium: 3.8 mmol/L (ref 3.5–5.1)
Sodium: 135 mmol/L (ref 135–145)

## 2021-05-29 LAB — CBC
HCT: 42.2 % (ref 39.0–52.0)
Hemoglobin: 13.7 g/dL (ref 13.0–17.0)
MCH: 30.5 pg (ref 26.0–34.0)
MCHC: 32.5 g/dL (ref 30.0–36.0)
MCV: 94 fL (ref 80.0–100.0)
Platelets: 221 10*3/uL (ref 150–400)
RBC: 4.49 MIL/uL (ref 4.22–5.81)
RDW: 13.4 % (ref 11.5–15.5)
WBC: 9.2 10*3/uL (ref 4.0–10.5)
nRBC: 0 % (ref 0.0–0.2)

## 2021-05-29 LAB — BRAIN NATRIURETIC PEPTIDE: B Natriuretic Peptide: 423 pg/mL — ABNORMAL HIGH (ref 0.0–100.0)

## 2021-05-29 NOTE — Patient Instructions (Signed)
Medication Instructions:  Your physician recommends that you continue on your current medications as directed. Please refer to the Current Medication list given to you today.  *If you need a refill on your cardiac medications before your next appointment, please call your pharmacy*   Lab Work: Your physician recommends that you return for lab work in: BMET, BNP, CBC  If you have labs (blood work) drawn today and your tests are completely normal, you will receive your results only by: MyChart Message (if you have MyChart) OR A paper copy in the mail If you have any lab test that is abnormal or we need to change your treatment, we will call you to review the results.   Testing/Procedures: Your physician has requested that you have an echocardiogram. Echocardiography is a painless test that uses sound waves to create images of your heart. It provides your doctor with information about the size and shape of your heart and how well your heart's chambers and valves are working. This procedure takes approximately one hour. There are no restrictions for this procedure.    Follow-Up: At [facility], you and your health needs are our priority.  As part of our continuing mission to provide you with exceptional heart care, we have created designated Provider Care Teams.  These Care Teams include your primary Cardiologist (physician) and Advanced Practice Providers (APPs -  Physician Assistants and Nurse Practitioners) who all work together to provide you with the care you need, when you need it.  We recommend signing up for the patient portal called "MyChart".  Sign up information is provided on this After Visit Summary.  MyChart is used to connect with patients for Virtual Visits (Telemedicine).  Patients are able to view lab/test results, encounter notes, upcoming appointments, etc.  Non-urgent messages can be sent to your provider as well.   To learn more about what you can do with MyChart, go to  [email or web address].    Your next appointment:    Next Available   The format for your next appointment:     Provider:   Allred     Other Instructions Thank you for choosing [facility]!

## 2021-05-30 ENCOUNTER — Telehealth: Payer: Self-pay | Admitting: *Deleted

## 2021-05-30 DIAGNOSIS — R0602 Shortness of breath: Secondary | ICD-10-CM

## 2021-05-30 DIAGNOSIS — Z79899 Other long term (current) drug therapy: Secondary | ICD-10-CM

## 2021-05-30 MED ORDER — METOLAZONE 5 MG PO TABS: 5.0000 mg | ORAL_TABLET | ORAL | 1 refills | Status: DC

## 2021-05-30 NOTE — Telephone Encounter (Signed)
-----   Message from [place], MD sent at 05/30/2021  4:18 PM EST ----- CBC is normal  Kidney function is OK  FLuid is up some  I would recomm taking Zaroxylyn  2x per week   30 min before lasix    Check BMET and BNP in 3 wks   Appt to be sched with J Allred in [place] office

## 2021-06-02 ENCOUNTER — Ambulatory Visit (HOSPITAL_COMMUNITY)
Admission: RE | Admit: 2021-06-02 | Discharge: 2021-06-02 | Disposition: A | Discharge status: Home / Self Care | Payer: Medicare Other | Source: Ambulatory Visit | Attending: Internal Medicine | Admitting: Internal Medicine

## 2021-06-02 ENCOUNTER — Other Ambulatory Visit: Payer: Self-pay

## 2021-06-02 DIAGNOSIS — R609 Edema, unspecified: Secondary | ICD-10-CM

## 2021-06-02 DIAGNOSIS — R0602 Shortness of breath: Secondary | ICD-10-CM | POA: Diagnosis not present

## 2021-06-02 LAB — ECHOCARDIOGRAM COMPLETE
AR max vel: 1.17 cm2
AV Area VTI: 1.21 cm2
AV Area mean vel: 1.2 cm2
AV Mean grad: 16.7 mmHg
AV Peak grad: 29.5 mmHg
Ao pk vel: 2.71 m/s
Area-P 1/2: 3.31 cm2
MV M vel: 4.23 m/s
MV Peak grad: 71.6 mmHg
S' Lateral: 3.6 cm

## 2021-06-02 NOTE — Progress Notes (Signed)
*  PRELIMINARY RESULTS* Echocardiogram 2D Echocardiogram has been performed.  Ryan Garner 06/02/2021, 10:36 AM

## 2021-06-05 ENCOUNTER — Ambulatory Visit (INDEPENDENT_AMBULATORY_CARE_PROVIDER_SITE_OTHER): Payer: Medicare Other | Admitting: Licensed Clinical Social Worker

## 2021-06-05 DIAGNOSIS — E1169 Type 2 diabetes mellitus with other specified complication: Secondary | ICD-10-CM

## 2021-06-05 DIAGNOSIS — I10 Essential (primary) hypertension: Secondary | ICD-10-CM

## 2021-06-05 DIAGNOSIS — E782 Mixed hyperlipidemia: Secondary | ICD-10-CM

## 2021-06-05 DIAGNOSIS — I4819 Other persistent atrial fibrillation: Secondary | ICD-10-CM

## 2021-06-05 NOTE — Patient Instructions (Addendum)
Visit Information  Patient Goals:  Manage Emotions; Manage mobility needs. Complete ADLs as able  Timeframe:  Short-Term Goal Priority:  Medium Progress:  On Track Start Date:             06/05/21               Expected End Date:           08/31/21           Follow Up Date   08/03/21 at 10:00 AM   Manage Emotions; Manage mobility needs; Complete ADLs as able    Why is this important?   When you are stressed, down or upset, your body reacts too.  For example, your blood pressure may get higher; you may have a headache or stomachache.  When your emotions get the best of you, your body's ability to fight off cold and flu gets weak.  These steps will help you manage your emotions.     Patient Coping Skills: Completes ADLs daily as able Attends scheduled medical appointments Has support from his wife, [name]  Patient Deficits  Mobility challenges Some pain issues  Patient Goals:  In next 30 days, patient will: Attend scheduled medical appointments Talk with RNCM or LCSW about CCM program support Talk with LCSW about mobility issues of client -  Follow Up Plan: LCSW to call client on  08/03/21 at 10:00 AM to assess client needs at that time    Norva Riffle.Surabhi Gadea MSW, LCSW Licensed Clinical Social Worker [facility] Care Management [phone]
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
## 2021-06-06 ENCOUNTER — Telehealth: Payer: Self-pay

## 2021-06-06 DIAGNOSIS — R931 Abnormal findings on diagnostic imaging of heart and coronary circulation: Secondary | ICD-10-CM

## 2021-06-06 NOTE — Telephone Encounter (Signed)
Patient notified, order placed along with BMET. I have forwarded to [employer] to schedule.

## 2021-06-06 NOTE — Telephone Encounter (Signed)
-----   Message from Bernita Raisin, RN sent at 06/06/2021  8:23 AM EST -----  ----- Message ----- From: Fay Records, MD Sent: 06/05/2021   9:42 PM EST To: Bernita Raisin, RN  Pumping function of the left ventricle is normal The Right side of the heart that pumps blood to lungs, right ventricle is larger.  Images suggest that pressures may be up in the lung Aortic valve is mild to moderately narrowed/stenotic With Right ventricle findings I would recomm a CT angiogram of lungs to r/o PE  Esp given SOB

## 2021-06-08 ENCOUNTER — Other Ambulatory Visit (HOSPITAL_COMMUNITY)
Admission: RE | Admit: 2021-06-08 | Discharge: 2021-06-08 | Disposition: A | Discharge status: Home / Self Care | Payer: Medicare Other | Source: Ambulatory Visit | Attending: Internal Medicine | Admitting: Internal Medicine

## 2021-06-08 DIAGNOSIS — R931 Abnormal findings on diagnostic imaging of heart and coronary circulation: Secondary | ICD-10-CM | POA: Diagnosis not present

## 2021-06-08 DIAGNOSIS — R0602 Shortness of breath: Secondary | ICD-10-CM | POA: Insufficient documentation

## 2021-06-08 DIAGNOSIS — Z79899 Other long term (current) drug therapy: Secondary | ICD-10-CM | POA: Diagnosis not present

## 2021-06-08 LAB — BASIC METABOLIC PANEL
Anion gap: 8 (ref 5–15)
BUN: 33 mg/dL — ABNORMAL HIGH (ref 8–23)
CO2: 29 mmol/L (ref 22–32)
Calcium: 9.2 mg/dL (ref 8.9–10.3)
Chloride: 99 mmol/L (ref 98–111)
Creatinine, Ser: 1.32 mg/dL — ABNORMAL HIGH (ref 0.61–1.24)
GFR, Estimated: 56 mL/min — ABNORMAL LOW (ref >60)
Glucose, Bld: 113 mg/dL — ABNORMAL HIGH (ref 70–99)
Potassium: 3.7 mmol/L (ref 3.5–5.1)
Sodium: 136 mmol/L (ref 135–145)

## 2021-06-08 LAB — BRAIN NATRIURETIC PEPTIDE: B Natriuretic Peptide: 352 pg/mL — ABNORMAL HIGH (ref 0.0–100.0)

## 2021-06-09 ENCOUNTER — Telehealth: Payer: Self-pay

## 2021-06-09 MED ORDER — METOLAZONE 5 MG PO TABS: 5.0000 mg | ORAL_TABLET | ORAL | 1 refills | Status: DC

## 2021-06-09 NOTE — Telephone Encounter (Signed)
Results discussed with patient.He will reduce zaroxolyn to once a week

## 2021-06-09 NOTE — Telephone Encounter (Signed)
-----   Message from Fay Records, MD sent at 06/09/2021  4:25 PM EST ----- Fluid is improved a little bit but I would probably back down on Zaroxylyn to 1x per week Watch fluid   1.5 L max per day Limt salt to 2 gram (esp over Thanksgiving) CT is sched for 11/28

## 2021-06-13 ENCOUNTER — Ambulatory Visit (HOSPITAL_COMMUNITY): Payer: Medicare Other

## 2021-06-19 ENCOUNTER — Ambulatory Visit (HOSPITAL_COMMUNITY)
Admission: RE | Admit: 2021-06-19 | Discharge: 2021-06-19 | Disposition: A | Discharge status: Home / Self Care | Payer: Medicare Other | Source: Ambulatory Visit | Attending: Internal Medicine | Admitting: Internal Medicine

## 2021-06-19 ENCOUNTER — Other Ambulatory Visit: Payer: Self-pay

## 2021-06-19 DIAGNOSIS — J90 Pleural effusion, not elsewhere classified: Secondary | ICD-10-CM | POA: Diagnosis not present

## 2021-06-19 DIAGNOSIS — R931 Abnormal findings on diagnostic imaging of heart and coronary circulation: Secondary | ICD-10-CM | POA: Insufficient documentation

## 2021-06-19 DIAGNOSIS — R911 Solitary pulmonary nodule: Secondary | ICD-10-CM | POA: Diagnosis not present

## 2021-06-19 MED ORDER — IOHEXOL 350 MG/ML SOLN
100.0000 mL | Freq: Once | INTRAVENOUS | Status: AC | PRN
  Administered 2021-06-19: 15:00:00 100 mL via INTRAVENOUS

## 2021-06-20 ENCOUNTER — Encounter: Payer: Self-pay | Admitting: Family Medicine

## 2021-06-20 ENCOUNTER — Ambulatory Visit (INDEPENDENT_AMBULATORY_CARE_PROVIDER_SITE_OTHER): Payer: Medicare Other | Admitting: Family Medicine

## 2021-06-20 ENCOUNTER — Telehealth: Payer: Self-pay

## 2021-06-20 VITALS — BP 110/73 | HR 71 | Temp 97.5°F

## 2021-06-20 DIAGNOSIS — J069 Acute upper respiratory infection, unspecified: Secondary | ICD-10-CM | POA: Diagnosis not present

## 2021-06-20 DIAGNOSIS — J101 Influenza due to other identified influenza virus with other respiratory manifestations: Secondary | ICD-10-CM | POA: Diagnosis not present

## 2021-06-20 DIAGNOSIS — R062 Wheezing: Secondary | ICD-10-CM | POA: Diagnosis not present

## 2021-06-20 MED ORDER — ALBUTEROL SULFATE HFA 108 (90 BASE) MCG/ACT IN AERS: 2.0000 | INHALATION_SPRAY | Freq: Four times a day (QID) | RESPIRATORY_TRACT | 2 refills | Status: DC | PRN

## 2021-06-20 MED ORDER — AZITHROMYCIN 250 MG PO TABS: ORAL_TABLET | ORAL | 0 refills | Status: DC

## 2021-06-20 MED ORDER — PREDNISONE 20 MG PO TABS: 20.0000 mg | ORAL_TABLET | Freq: Every day | ORAL | 0 refills | Status: AC

## 2021-06-20 NOTE — Progress Notes (Signed)
Subjective:  Patient ID: Ryan Garner, male    DOB: [birth year], 75 y.o.   MRN: [id number]  Patient Care Team: [name], [name], MD as PCP - General (Family Medicine) Fay Records, MD as PCP - Cardiology (Cardiology) Ilean China, RN as Case Manager Shea Evans Norva Riffle, LCSW as [place] (Licensed Clinical Social Worker)   Chief Complaint:  flu-like symptoms   HPI: Ryan Garner is a 75 y.o. male presenting on 06/20/2021 for flu-like symptoms   Patient presents today with wife for evaluation of flulike symptoms.  He reports fever, chills, headache, myalgias, cough, wheezing, chest congestion, decreased appetite, and malaise since Friday.  His son tested positive for influenza and he was with him last week.  He has been taking Tylenol with some relief of symptoms.     Relevant past medical, surgical, family, and social history reviewed and updated as indicated.  Allergies and medications reviewed and updated. Data reviewed: Chart in Epic.   Past Medical History:  Diagnosis Date   A-fib [facility])    Acute on chronic diastolic CHF (congestive heart failure) ([place]) 07/21/2020   Hyperlipidemia    Hypertension    Metabolic syndrome    Prediabetes    STEMI (ST elevation myocardial infarction) [facility])     Past Surgical History:  Procedure Laterality Date   APPENDECTOMY     CHOLECYSTECTOMY N/A 03/02/2020   Procedure: LAPAROSCOPIC CHOLECYSTECTOMY;  Surgeon: Ryan Signs, MD;  Location: [facility];  Service: General;  Laterality: N/A;   CORONARY STENT INTERVENTION N/A 04/30/2020   Procedure: CORONARY STENT INTERVENTION;  Surgeon: Martinique, Peter M, MD;  Location: [place] [facility];  Service: Cardiovascular;  Laterality: N/A;   CORONARY/GRAFT ACUTE MI REVASCULARIZATION N/A 04/30/2020   Procedure: Coronary/Graft Acute MI Revascularization;  Surgeon: Martinique, Peter M, MD;  Location: [place] [facility];  Service: Cardiovascular;  Laterality: N/A;   Eyelid  Surgery Bilateral    HERNIA REPAIR     knee tendons repair     LEFT HEART CATH AND CORONARY ANGIOGRAPHY N/A 04/30/2020   Procedure: LEFT HEART CATH AND CORONARY ANGIOGRAPHY;  Surgeon: Martinique, Peter M, MD;  Location: [place] [facility];  Service: Cardiovascular;  Laterality: N/A;   ROTATOR CUFF REPAIR Bilateral    TONSILLECTOMY      Social History   Socioeconomic History   Marital status: Married    Spouse name: Ryan Garner   Number of children: 2   Years of education: 20   Highest education level: Doctorate  Occupational History   Occupation: Theme park manager    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  Tobacco Use   Smoking status: Former    Types: Cigarettes    Quit date: 04/02/1959    Years since quitting: 62.2   Smokeless tobacco: Never  Vaping Use   Vaping Use: Never used  Substance and Sexual Activity   Alcohol use: No   Drug use: No   Sexual activity: Yes  Other Topics Concern   Not on file  Social History Narrative   Not on file   Social Determinants of Health   Financial Resource Strain: Low Risk    Difficulty of Paying Living Expenses: Not hard at all  Food Insecurity: No Food Insecurity   Worried About Charity fundraiser in the Last Year: Never true   [place] in the Last Year: Never true  Transportation Needs: No  Transportation Needs   Lack of Transportation (Medical): No   Lack of Transportation (Non-Medical): No  Physical Activity: Inactive   Days of Exercise per Week: 0 days   Minutes of Exercise per Session: 0 min  Stress: Stress Concern Present   Feeling of Stress : To some extent  Social Connections: Moderately Integrated   Frequency of Communication with Friends and Family: Once a week   Frequency of Social Gatherings with Friends and Family: Once a week   Attends Religious Services: More than 4 times per year   Active Member of [employer] or Organizations: Yes   Attends Archivist Meetings: 1 to 4  times per year   Marital Status: Married  Human resources officer Violence: Not on file    Outpatient Encounter Medications as of 06/20/2021  Medication Sig   albuterol (VENTOLIN HFA) 108 (90 Base) MCG/ACT inhaler Inhale 2 puffs into the lungs every 6 (six) hours as needed for wheezing or shortness of breath.   apixaban (ELIQUIS) 5 MG TABS tablet Take 1 tablet (5 mg total) by mouth 2 (two) times daily.   atorvastatin (LIPITOR) 80 MG tablet Take 1 tablet (80 mg total) by mouth daily.   azithromycin (ZITHROMAX Z-PAK) 250 MG tablet As directed   clopidogrel (PLAVIX) 75 MG tablet Take 1 tablet (75 mg total) by mouth daily with breakfast.   empagliflozin (JARDIANCE) 10 MG TABS tablet Take 1 tablet (10 mg total) by mouth daily before breakfast.   fluticasone (FLONASE) 50 MCG/ACT nasal spray Place 2 sprays into both nostrils daily.   furosemide (LASIX) 40 MG tablet Take 2 tablets by mouth once daily   metolazone (ZAROXOLYN) 5 MG tablet Take 1 tablet (5 mg total) by mouth once a week. Take 30 prior to taking Lasix   metoprolol tartrate (LOPRESSOR) 50 MG tablet Take 1 tablet (50 mg total) by mouth 2 (two) times daily.   Misc Natural Products (PROSTATE SUPPORT) 300-15 MG TABS Take 1 tablet by mouth daily. Super Beta Prostate   Multiple Vitamin (MULTIVITAMIN WITH MINERALS) TABS tablet Take 1 tablet by mouth daily.   nitroGLYCERIN (NITROSTAT) 0.4 MG SL tablet Place 1 tablet (0.4 mg total) under the tongue every 5 (five) minutes x 3 doses as needed for chest pain.   potassium chloride SA (KLOR-CON) 20 MEQ tablet Take 1 tablet (20 mEq total) by mouth daily.   predniSONE (DELTASONE) 20 MG tablet Take 1 tablet (20 mg total) by mouth daily with breakfast for 5 days.   triamcinolone cream (KENALOG) 0.1 % Apply 1 application topically 2 (two) times daily.   [DISCONTINUED] lisinopril (ZESTRIL) 2.5 MG tablet Take 1 tablet (2.5 mg total) by mouth daily. (Patient not taking: Reported on 05/29/2021)   No  facility-administered encounter medications on file as of 06/20/2021.    Allergies  Allergen Reactions   Flecainide     Per patient dizziness, shortness of breath, blurred vision   Rofecoxib Other (See Comments)    (Vioxx)    Review of Systems  Constitutional:  Positive for activity change, appetite change, chills, fatigue and fever. Negative for diaphoresis and unexpected weight change.  HENT:  Positive for congestion, rhinorrhea and sore throat.   Respiratory:  Positive for cough. Negative for shortness of breath.   Cardiovascular:  Negative for chest pain.  Gastrointestinal:  Negative for abdominal pain.  Genitourinary:  Negative for decreased urine volume and difficulty urinating.  Musculoskeletal:  Positive for myalgias.  Neurological:  Positive for headaches. Negative for dizziness, tremors, seizures, syncope,  facial asymmetry, speech difficulty, weakness, light-headedness and numbness.  Psychiatric/Behavioral:  Negative for confusion.   All other systems reviewed and are negative.      Objective:  BP 110/73   Pulse 71   Temp (!) 97.5 F (36.4 C)   SpO2 95%    Wt Readings from Last 3 Encounters:  05/29/21 298 lb 12.8 oz (135.5 kg)  04/28/21 292 lb 12.8 oz (132.8 kg)  03/23/21 295 lb (133.8 kg)    Physical Exam Vitals and nursing note reviewed.  Constitutional:      General: He is not in acute distress.    Appearance: Normal appearance. He is well-developed and well-groomed. He is obese. He is not ill-appearing, toxic-appearing or diaphoretic.  HENT:     Head: Normocephalic and atraumatic.     Jaw: There is normal jaw occlusion.     Right Ear: Hearing, tympanic membrane, ear canal and external ear normal.     Left Ear: Hearing, tympanic membrane, ear canal and external ear normal.     Nose: Congestion and rhinorrhea present.     Mouth/Throat:     Lips: Pink.     Mouth: Mucous membranes are moist.     Pharynx: Oropharynx is clear. Uvula midline. Posterior  oropharyngeal erythema present. No oropharyngeal exudate.  Eyes:     General: Lids are normal.     Extraocular Movements: Extraocular movements intact.     Conjunctiva/sclera: Conjunctivae normal.     Pupils: Pupils are equal, round, and reactive to light.  Neck:     Thyroid: No thyroid mass, thyromegaly or thyroid tenderness.     Vascular: No carotid bruit or JVD.     Trachea: Trachea and phonation normal.  Cardiovascular:     Rate and Rhythm: Normal rate and regular rhythm.     Chest Wall: PMI is not displaced.     Pulses: Normal pulses.     Heart sounds: Normal heart sounds. No murmur heard.   No friction rub. No gallop.  Pulmonary:     Effort: Pulmonary effort is normal. No respiratory distress.     Breath sounds: No stridor. Wheezing present. No rhonchi or rales.  Chest:     Chest wall: No tenderness.  Abdominal:     General: Bowel sounds are normal. There is no distension or abdominal bruit.     Palpations: Abdomen is soft. There is no hepatomegaly or splenomegaly.     Tenderness: There is no abdominal tenderness. There is no right CVA tenderness or left CVA tenderness.     Hernia: No hernia is present.  Musculoskeletal:        General: Normal range of motion.     Cervical back: Normal range of motion and neck supple.     Right lower leg: No edema.     Left lower leg: No edema.  Lymphadenopathy:     Cervical: No cervical adenopathy.  Skin:    General: Skin is warm and dry.     Capillary Refill: Capillary refill takes less than 2 seconds.     Coloration: Skin is not cyanotic, jaundiced or pale.     Findings: No rash.  Neurological:     General: No focal deficit present.     Mental Status: He is alert and oriented to person, place, and time.     Cranial Nerves: No cranial nerve deficit.     Sensory: Sensation is intact. No sensory deficit.     Motor: Motor function is intact. No weakness.  Coordination: Coordination is intact. Coordination normal.     Gait: Gait is  intact. Gait normal.     Deep Tendon Reflexes: Reflexes are normal and symmetric. Reflexes normal.  Psychiatric:        Attention and Perception: Attention and perception normal.        Mood and Affect: Mood and affect normal.        Speech: Speech normal.        Behavior: Behavior normal. Behavior is cooperative.        Thought Content: Thought content normal.        Cognition and Memory: Cognition and memory normal.        Judgment: Judgment normal.    Results for orders placed or performed during the hospital encounter of 64/33/29  Basic metabolic panel  Result Value Ref Range   Sodium 136 135 - 145 mmol/L   Potassium 3.7 3.5 - 5.1 mmol/L   Chloride 99 98 - 111 mmol/L   CO2 29 22 - 32 mmol/L   Glucose, Bld 113 (H) 70 - 99 mg/dL   BUN 33 (H) 8 - 23 mg/dL   Creatinine, Ser 1.32 (H) 0.61 - 1.24 mg/dL   Calcium 9.2 8.9 - 10.3 mg/dL   GFR, Estimated 56 (L) >60 mL/min   Anion gap 8 5 - 15  B Nat Peptide  Result Value Ref Range   B Natriuretic Peptide 352.0 (H) 0.0 - 100.0 pg/mL     Influenza A positive.   Pertinent labs & imaging results that were available during my care of the patient were reviewed by me and considered in my medical decision making.  Assessment & Plan:  [place] was seen today for flu-like symptoms.  Diagnoses and all orders for this visit:  URI with cough and congestion Wheezing Influenza A Influenza A positive, symptoms greater than 72 hours so will not initiate Tamiflu.  Wheezing, congested cough, and ongoing fever.  Will burst with steroids.  Albuterol and Zithromax as prescribed.  Symptomatic care discussed in detail.  Patient aware to follow-up in 2 weeks for reevaluation. -     Veritor Flu A/B Waived -     Novel Coronavirus, NAA ([facility]) -     azithromycin (ZITHROMAX Z-PAK) 250 MG tablet; As directed -     albuterol (VENTOLIN HFA) 108 (90 Base) MCG/ACT inhaler; Inhale 2 puffs into the lungs every 6 (six) hours as needed for wheezing or shortness of  breath. -     predniSONE (DELTASONE) 20 MG tablet; Take 1 tablet (20 mg total) by mouth daily with breakfast for 5 days.    Continue all other maintenance medications.  Follow up plan: Return in 2 weeks (on 07/04/2021), or if symptoms worsen or fail to improve.   Continue healthy lifestyle choices, including diet (rich in fruits, vegetables, and lean proteins, and low in salt and simple carbohydrates) and exercise (at least 30 minutes of moderate physical activity daily).  Educational handout given for URI  The above assessment and management plan was discussed with the patient. The patient verbalized understanding of and has agreed to the management plan. Patient is aware to call the clinic if they develop any new symptoms or if symptoms persist or worsen. Patient is aware when to return to the clinic for a follow-up visit. Patient educated on when it is appropriate to go to the emergency department.   Monia Pouch, FNP-C [facility] [phone]

## 2021-06-20 NOTE — Telephone Encounter (Signed)
Spoke with pt and scheduled OV per Dr. Ileene Musa requested by Dr. Harrington Challenger on 07/05/21. Nothing further needed at this time.

## 2021-06-21 ENCOUNTER — Ambulatory Visit: Payer: Medicare Other | Admitting: Internal Medicine

## 2021-06-21 LAB — SARS-COV-2, NAA 2 DAY TAT

## 2021-06-21 LAB — VERITOR FLU A/B WAIVED
Influenza A: POSITIVE — AB
Influenza B: NEGATIVE

## 2021-06-21 LAB — NOVEL CORONAVIRUS, NAA: SARS-CoV-2, NAA: NOT DETECTED

## 2021-06-30 ENCOUNTER — Ambulatory Visit: Payer: Medicare Other | Admitting: Internal Medicine

## 2021-07-03 ENCOUNTER — Ambulatory Visit (INDEPENDENT_AMBULATORY_CARE_PROVIDER_SITE_OTHER): Payer: Medicare Other

## 2021-07-03 VITALS — Ht 67.0 in | Wt 299.0 lb

## 2021-07-03 DIAGNOSIS — Z0000 Encounter for general adult medical examination without abnormal findings: Secondary | ICD-10-CM | POA: Diagnosis not present

## 2021-07-03 NOTE — Progress Notes (Signed)
Subjective:   Ryan Garner is a 75 y.o. male who presents for Medicare Annual/Subsequent preventive examination.  Virtual Visit via Telephone Note  I connected with  Ryan Garner on 07/03/21 at  1:15 PM EST by telephone and verified that I am speaking with the correct person using two identifiers.  Location: Patient: Home Provider: [facility] Persons participating in the virtual visit: patient/Nurse Health Advisor   I discussed the limitations, risks, security and privacy concerns of performing an evaluation and management service by telephone and the availability of in person appointments. The patient expressed understanding and agreed to proceed.  Interactive audio and video telecommunications were attempted between this nurse and patient, however failed, due to patient having technical difficulties OR patient did not have access to video capability.  We continued and completed visit with audio only.  Some vital signs may be absent or patient reported.   Ryan Garner Ryan Barbarajean Kinzler, LPN   Review of Systems     Cardiac Risk Factors include: advanced age (>51men, >3 women);diabetes mellitus;male gender;dyslipidemia;hypertension;sedentary lifestyle;obesity (BMI >30kg/m2);Other (see comment), Risk factor comments: hx of MI, CAD, A.Fib, CHF     Objective:    Today's Vitals   07/03/21 1314 07/03/21 1316  Weight: 299 lb (135.6 kg)   Height: 5\' 7"  (1.702 m)   PainSc:  5    Body mass index is 46.83 kg/m.  Advanced Directives 07/03/2021 07/21/2020 06/30/2020 06/08/2020 04/30/2020 03/02/2020 02/29/2020  Does Patient Have a Medical Advance Directive? Yes No No No No No No  Type of Paramedic of [place];Living will - - - - - -  Copy of [place] in Chart? No - copy requested - - - - - -  Would patient like information on creating a medical advance directive? - Yes (ED - Information included in AVS) No - Patient declined No - Patient declined No - Patient  declined No - Patient declined No - Patient declined    Current Medications (verified) Outpatient Encounter Medications as of 07/03/2021  Medication Sig   albuterol (VENTOLIN HFA) 108 (90 Base) MCG/ACT inhaler Inhale 2 puffs into the lungs every 6 (six) hours as needed for wheezing or shortness of breath.   apixaban (ELIQUIS) 5 MG TABS tablet Take 1 tablet (5 mg total) by mouth 2 (two) times daily.   atorvastatin (LIPITOR) 80 MG tablet Take 1 tablet (80 mg total) by mouth daily.   azithromycin (ZITHROMAX Z-PAK) 250 MG tablet As directed   clopidogrel (PLAVIX) 75 MG tablet Take 1 tablet (75 mg total) by mouth daily with breakfast.   empagliflozin (JARDIANCE) 10 MG TABS tablet Take 1 tablet (10 mg total) by mouth daily before breakfast.   fluticasone (FLONASE) 50 MCG/ACT nasal spray Place 2 sprays into both nostrils daily.   furosemide (LASIX) 40 MG tablet Take 2 tablets by mouth once daily   metolazone (ZAROXOLYN) 5 MG tablet Take 1 tablet (5 mg total) by mouth once a week. Take 30 prior to taking Lasix   metoprolol tartrate (LOPRESSOR) 50 MG tablet Take 1 tablet (50 mg total) by mouth 2 (two) times daily.   Misc Natural Products (PROSTATE SUPPORT) 300-15 MG TABS Take 1 tablet by mouth daily. Super Beta Prostate   Multiple Vitamin (MULTIVITAMIN WITH MINERALS) TABS tablet Take 1 tablet by mouth daily.   nitroGLYCERIN (NITROSTAT) 0.4 MG SL tablet Place 1 tablet (0.4 mg total) under the tongue every 5 (five) minutes x 3 doses as needed for chest pain.  potassium chloride SA (KLOR-CON) 20 MEQ tablet Take 1 tablet (20 mEq total) by mouth daily.   triamcinolone cream (KENALOG) 0.1 % Apply 1 application topically 2 (two) times daily.   No facility-administered encounter medications on file as of 07/03/2021.    Allergies (verified) Flecainide and Rofecoxib   History: Past Medical History:  Diagnosis Date   A-fib ([place])    Acute on chronic diastolic CHF (congestive heart failure) ([place])  07/21/2020   Hyperlipidemia    Hypertension    Metabolic syndrome    Prediabetes    STEMI (ST elevation myocardial infarction) [facility])    Past Surgical History:  Procedure Laterality Date   APPENDECTOMY     CHOLECYSTECTOMY N/A 03/02/2020   Procedure: LAPAROSCOPIC CHOLECYSTECTOMY;  Surgeon: Aviva Signs, MD;  Location: [facility];  Service: General;  Laterality: N/A;   CORONARY STENT INTERVENTION N/A 04/30/2020   Procedure: CORONARY STENT INTERVENTION;  Surgeon: Martinique, Peter M, MD;  Location: [place] [facility];  Service: Cardiovascular;  Laterality: N/A;   CORONARY/GRAFT ACUTE MI REVASCULARIZATION N/A 04/30/2020   Procedure: Coronary/Graft Acute MI Revascularization;  Surgeon: Martinique, Peter M, MD;  Location: [place] [facility];  Service: Cardiovascular;  Laterality: N/A;   Eyelid Surgery Bilateral    HERNIA REPAIR     knee tendons repair     LEFT HEART CATH AND CORONARY ANGIOGRAPHY N/A 04/30/2020   Procedure: LEFT HEART CATH AND CORONARY ANGIOGRAPHY;  Surgeon: Martinique, Peter M, MD;  Location: [place] [facility];  Service: Cardiovascular;  Laterality: N/A;   ROTATOR CUFF REPAIR Bilateral    TONSILLECTOMY     Family History  Problem Relation Age of Onset   Cancer Mother        lungs   Coronary artery disease Mother    Diabetes Brother    Heart attack Brother    Diabetes Maternal Grandmother    Arthritis Father    Social History   Socioeconomic History   Marital status: Married    Spouse name: [name]   Number of children: 2   Years of education: 21   Highest education level: Doctorate  Occupational History   Occupation: Theme park manager    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  Tobacco Use   Smoking status: Former    Types: Cigarettes    Quit date: 04/02/1959    Years since quitting: 62.2   Smokeless tobacco: Never  Vaping Use   Vaping Use: Never used  Substance and Sexual Activity   Alcohol use: No   Drug use: No   Sexual activity:  Yes  Other Topics Concern   Not on file  Social History Narrative   Not on file   Social Determinants of Health   Financial Resource Strain: Low Risk    Difficulty of Paying Living Expenses: Not hard at all  Food Insecurity: No Food Insecurity   Worried About Charity fundraiser in the Last Year: Never true   [place] in the Last Year: Never true  Transportation Needs: No Transportation Needs   Lack of Transportation (Medical): No   Lack of Transportation (Non-Medical): No  Physical Activity: Insufficiently Active   Days of Exercise per Week: 2 days   Minutes of Exercise per Session: 40 min  Stress: No Stress Concern Present   Feeling of Stress : Only a little  Social Connections: Engineer, building services of Communication with Friends and Family: Twice a week  Frequency of Social Gatherings with Friends and Family: Twice a week   Attends Religious Services: More than 4 times per year   Active Member of [employer] or Organizations: Yes   Attends Music therapist: More than 4 times per year   Marital Status: Married    Tobacco Counseling Counseling given: Not Answered   Clinical Intake:  Pre-visit preparation completed: Yes  Pain : 0-10 Pain Score: 5  Pain Type: Chronic pain Pain Location: Back Pain Descriptors / Indicators: Aching, Discomfort Pain Onset: More than a month ago Pain Frequency: Intermittent     BMI - recorded: 46.83 Nutritional Status: BMI > 30  Obese Nutritional Risks: None Diabetes: Yes CBG done?: No Did pt. bring in CBG monitor from home?: No  How often do you need to have someone help you when you read instructions, pamphlets, or other written materials from your doctor or pharmacy?: 1 - Never  Diabetic? Nutrition Risk Assessment:  Has the patient had any N/V/D within the last 2 months?  No  Does the patient have any non-healing wounds?  No  Has the patient had any unintentional weight loss or weight gain?  No    Diabetes:  Is the patient diabetic?  Yes  If diabetic, was a CBG obtained today?  No  Did the patient bring in their glucometer from home?  No  How often do you monitor your CBG's? periodically.   Financial Strains and Diabetes Management:  Are you having any financial strains with the device, your supplies or your medication? No .  Does the patient want to be seen by Chronic Care Management for management of their diabetes?  No  Would the patient like to be referred to a Nutritionist or for Diabetic Management?  No   Diabetic Exams:  Diabetic Eye Exam: Completed 2022.   Diabetic Foot Exam: Completed 10/27/2020. Pt has been advised about the importance in completing this exam. Pt is scheduled for diabetic foot exam on next year.    Interpreter Needed?: No  Information entered by :: Dru Primeau, LPN   Activities of Daily Living In your present state of health, do you have any difficulty performing the following activities: 07/03/2021 07/21/2020  Hearing? Y Y  Comment mild - has hearing aids but doens't like to wear them -  Vision? N N  Difficulty concentrating or making decisions? N N  Walking or climbing stairs? Y Y  Comment SOB -  Dressing or bathing? N N  Doing errands, shopping? N N  Preparing Food and eating ? N -  Using the Toilet? N -  In the past six months, have you accidently leaked urine? N -  Do you have problems with loss of bowel control? N -  Managing your Medications? N -  Managing your Finances? N -  Housekeeping or managing your Housekeeping? N -  Some recent data might be hidden    Patient Care Team: [name], [name], MD as PCP - General (Family Medicine) Fay Records, MD as PCP - Cardiology (Cardiology) Ilean China, RN as Case Manager Shea Evans Norva Riffle, LCSW as [place] (Licensed Clinical Social Worker)  Indicate any recent [place] you may have received from other than [facility] providers in the past  year (date may be approximate).     Assessment:   This is a routine wellness examination for Ryan Garner.  Hearing/Vision screen Hearing Screening - Comments:: C/o mild hearing difficulties - has hearing aids, but doesn't wear  them Vision Screening - Comments:: Denies vision difficulties - Up to date with annual eye exams with [facility]  Dietary issues and exercise activities discussed: Current Exercise Habits: Home exercise routine, Type of exercise: walking;strength training/weights, Time (Minutes): 45, Frequency (Times/Week): 2, Weekly Exercise (Minutes/Week): 90, Intensity: Moderate, Exercise limited by: orthopedic condition(s);cardiac condition(s)   Goals Addressed             This Visit's Progress    DIET - REDUCE SODIUM INTAKE   On track    Exercise 150 min/wk Moderate Activity   On track      Depression Screen PHQ 2/9 Scores 07/03/2021 04/28/2021 04/19/2021 03/23/2021 01/30/2021 10/27/2020 10/18/2020  PHQ - 2 Score 0 0 2 0 0 0 0  PHQ- 9 Score - 0 5 - 3 - 1    Fall Risk Fall Risk  07/03/2021 04/28/2021 03/23/2021 10/27/2020 09/15/2020  Falls in the past year? 1 1 0 0 0  Number falls in past yr: 0 0 - - -  Injury with Fall? 0 0 - - -  Risk for fall due to : History of fall(s);Orthopedic patient History of fall(s) - - -  Follow up Falls prevention discussed Education provided - - -  Comment - - - - -    FALL RISK PREVENTION PERTAINING TO THE HOME:  Any stairs in or around the home? Yes  If so, are there any without handrails? No  Home free of loose throw rugs in walkways, pet beds, electrical cords, etc? Yes  Adequate lighting in your home to reduce risk of falls? Yes   ASSISTIVE DEVICES UTILIZED TO PREVENT FALLS:  Life alert? No  Use of a cane, walker or w/c? No  Grab bars in the bathroom? Yes  Shower chair or bench in shower? Yes  Elevated toilet seat or a handicapped toilet? Yes   TIMED UP AND GO:  Was the test performed? No . Telephonic visit  Cognitive  Function: MMSE - Mini Mental State Exam 10/23/2017  Orientation to time 5  Orientation to Place 5  Registration 3  Attention/ Calculation 5  Recall 3  Language- name 2 objects 2  Language- repeat 1  Language- follow 3 step command 3  Language- read & follow direction 1  Write a sentence 1  Copy design 0  Total score 29     6CIT Screen 06/30/2020 04/02/2019  What Year? 0 points 0 points  What month? 0 points 0 points  What time? 0 points 0 points  Count back from 20 0 points 0 points  Months in reverse 0 points 0 points  Repeat phrase 0 points 0 points  Total Score 0 0    Immunizations Immunization History  Administered Date(s) Administered   Hepatitis A 08/08/1995, 10/28/1996   Hepatitis B 12/09/1998, 08/04/1999, 08/28/2002   Pneumococcal Conjugate-13 08/26/2014   Pneumococcal Polysaccharide-23 02/04/2012, 08/07/2013   Smallpox 11/27/1967   Td 11/27/1967, 08/08/1993, 07/30/2003, 07/30/2016   Tdap 07/30/2016   Tetanus 07/23/2006   Typhoid Inactivated 08/04/1999, 08/28/2002, 08/15/2009   Yellow Fever 10/05/2013    TDAP status: Up to date  Flu Vaccine status: Declined, Education has been provided regarding the importance of this vaccine but patient still declined. Advised may receive this vaccine at local pharmacy or Health Dept. Aware to provide a copy of the vaccination record if obtained from local pharmacy or Health Dept. Verbalized acceptance and understanding.  Pneumococcal vaccine status: Up to date  Covid-19 vaccine status: Declined, Education has been provided regarding the  importance of this vaccine but patient still declined. Advised may receive this vaccine at local pharmacy or Health Dept.or vaccine clinic. Aware to provide a copy of the vaccination record if obtained from local pharmacy or Health Dept. Verbalized acceptance and understanding.  Qualifies for Shingles Vaccine? Yes   Zostavax completed No   Shingrix Completed?: No.    Education has been provided  regarding the importance of this vaccine. Patient has been advised to call insurance company to determine out of pocket expense if they have not yet received this vaccine. Advised may also receive vaccine at local pharmacy or Health Dept. Verbalized acceptance and understanding.  Screening Tests Health Maintenance  Topic Date Due   COVID-19 Vaccine (1) Never done   Zoster Vaccines- Shingrix (1 of 2) Never done   COLONOSCOPY (Pts 45-62yrs Insurance coverage will need to be confirmed)  08/26/2014   INFLUENZA VACCINE  Never done   OPHTHALMOLOGY EXAM  10/31/2021 (Originally 01/26/1956)   FOOT EXAM  10/27/2021   HEMOGLOBIN A1C  10/27/2021   URINE MICROALBUMIN  10/27/2021   TETANUS/TDAP  07/30/2026   Pneumonia Vaccine 44+ Years old  Completed   Hepatitis C Screening  Completed   HPV VACCINES  Aged Out    Health Maintenance  Health Maintenance Due  Topic Date Due   COVID-19 Vaccine (1) Never done   Zoster Vaccines- Shingrix (1 of 2) Never done   COLONOSCOPY (Pts 45-48yrs Insurance coverage will need to be confirmed)  08/26/2014   INFLUENZA VACCINE  Never done    Colorectal cancer screening: No longer required.   Lung Cancer Screening: (Low Dose CT Chest recommended if Age 25-80 years, 30 pack-year currently smoking OR have quit w/in 15years.) does not qualify.  Additional Screening:  Hepatitis C Screening: does qualify; Completed 08/07/2013  Vision Screening: Recommended annual ophthalmology exams for early detection of glaucoma and other disorders of the eye. Is the patient up to date with their annual eye exam?  Yes  Who is the provider or what is the name of the office in which the patient attends annual eye exams? [facility] If pt is not established with a provider, would they like to be referred to a provider to establish care? No .   Dental Screening: Recommended annual dental exams for proper oral hygiene  Community Resource Referral / Chronic Care Management: CRR  required this visit?  No   CCM required this visit?  No      Plan:     I have personally reviewed and noted the following in the patient's chart:   Medical and social history Use of alcohol, tobacco or illicit drugs  Current medications and supplements including opioid prescriptions. Patient is not currently taking opioid prescriptions. Functional ability and status Nutritional status Physical activity Advanced directives List of other physicians Hospitalizations, surgeries, and ER visits in previous 12 months Vitals Screenings to include cognitive, depression, and falls Referrals and appointments  In addition, I have reviewed and discussed with patient certain preventive protocols, quality metrics, and best practice recommendations. A written personalized care plan for preventive services as well as general preventive health recommendations were provided to patient.     Sandrea Hammond, LPN   [id number]   Nurse Notes: None

## 2021-07-03 NOTE — Patient Instructions (Signed)
Ryan Garner , Thank you for taking time to come for your Medicare Wellness Visit. I appreciate your ongoing commitment to your health goals. Please review the following plan we discussed and let me know if I can assist you in the future.   Screening recommendations/referrals: Colonoscopy: Done 11/23/2009 - Repeat in 5 years *due Recommended yearly ophthalmology/optometry visit for glaucoma screening and checkup Recommended yearly dental visit for hygiene and checkup  Vaccinations: Influenza vaccine: Declined Pneumococcal vaccine: Done 08/07/2013 & 08/26/2014 Tdap vaccine: Done 07/30/2016 - Repeat in 10 years Shingles vaccine: Due   Covid-19: Declined  Advanced directives: Please bring a copy of your health care power of attorney and living will to the office to be added to your chart at your convenience.   Conditions/risks identified: Aim for 30 minutes of exercise or brisk walking each day, drink 6-8 glasses of water and eat lots of fruits and vegetables.   Next appointment: Follow up in one year for your annual wellness visit.   Preventive Care 78 Years and Older, Male  Preventive care refers to lifestyle choices and visits with your health care provider that can promote health and wellness. What does preventive care include? A yearly physical exam. This is also called an annual well check. Dental exams once or twice a year. Routine eye exams. Ask your health care provider how often you should have your eyes checked. Personal lifestyle choices, including: Daily care of your teeth and gums. Regular physical activity. Eating a healthy diet. Avoiding tobacco and drug use. Limiting alcohol use. Practicing safe sex. Taking low doses of aspirin every day. Taking vitamin and mineral supplements as recommended by your health care provider. What happens during an annual well check? The services and screenings done by your health care provider during your annual well check will depend on your  age, overall health, lifestyle risk factors, and family history of disease. Counseling  Your health care provider may ask you questions about your: Alcohol use. Tobacco use. Drug use. Emotional well-being. Home and relationship well-being. Sexual activity. Eating habits. History of falls. Memory and ability to understand (cognition). Work and work Statistician. Screening  You may have the following tests or measurements: Height, weight, and BMI. Blood pressure. Lipid and cholesterol levels. These may be checked every 5 years, or more frequently if you are over 101 years old. Skin check. Lung cancer screening. You may have this screening every year starting at age 51 if you have a 30-pack-year history of smoking and currently smoke or have quit within the past 15 years. Fecal occult blood test (FOBT) of the stool. You may have this test every year starting at age 16. Flexible sigmoidoscopy or colonoscopy. You may have a sigmoidoscopy every 5 years or a colonoscopy every 10 years starting at age 64. Prostate cancer screening. Recommendations will vary depending on your family history and other risks. Hepatitis C blood test. Hepatitis B blood test. Sexually transmitted disease (STD) testing. Diabetes screening. This is done by checking your blood sugar (glucose) after you have not eaten for a while (fasting). You may have this done every 1-3 years. Abdominal aortic aneurysm (AAA) screening. You may need this if you are a current or former smoker. Osteoporosis. You may be screened starting at age 68 if you are at high risk. Talk with your health care provider about your test results, treatment options, and if necessary, the need for more tests. Vaccines  Your health care provider may recommend certain vaccines, such as: Influenza vaccine.  This is recommended every year. Tetanus, diphtheria, and acellular pertussis (Tdap, Td) vaccine. You may need a Td booster every 10 years. Zoster  vaccine. You may need this after age 71. Pneumococcal 13-valent conjugate (PCV13) vaccine. One dose is recommended after age 17. Pneumococcal polysaccharide (PPSV23) vaccine. One dose is recommended after age 54. Talk to your health care provider about which screenings and vaccines you need and how often you need them. This information is not intended to replace advice given to you by your health care provider. Make sure you discuss any questions you have with your health care provider. Document Released: 08/05/2015 Document Revised: 03/28/2016 Document Reviewed: 05/10/2015 Elsevier Interactive Patient Education  2017 [employer] Prevention in the Home Falls can cause injuries. They can happen to people of all ages. There are many things you can do to make your home safe and to help prevent falls. What can I do on the outside of my home? Regularly fix the edges of walkways and driveways and fix any cracks. Remove anything that might make you trip as you walk through a door, such as a raised step or threshold. Trim any bushes or trees on the path to your home. Use bright outdoor lighting. Clear any walking paths of anything that might make someone trip, such as rocks or tools. Regularly check to see if handrails are loose or broken. Make sure that both sides of any steps have handrails. Any raised decks and porches should have guardrails on the edges. Have any leaves, snow, or ice cleared regularly. Use sand or salt on walking paths during winter. Clean up any spills in your garage right away. This includes oil or grease spills. What can I do in the bathroom? Use night lights. Install grab bars by the toilet and in the tub and shower. Do not use towel bars as grab bars. Use non-skid mats or decals in the tub or shower. If you need to sit down in the shower, use a plastic, non-slip stool. Keep the floor dry. Clean up any water that spills on the floor as soon as it happens. Remove  soap buildup in the tub or shower regularly. Attach bath mats securely with double-sided non-slip rug tape. Do not have throw rugs and other things on the floor that can make you trip. What can I do in the bedroom? Use night lights. Make sure that you have a light by your bed that is easy to reach. Do not use any sheets or blankets that are too big for your bed. They should not hang down onto the floor. Have a firm chair that has side arms. You can use this for support while you get dressed. Do not have throw rugs and other things on the floor that can make you trip. What can I do in the kitchen? Clean up any spills right away. Avoid walking on wet floors. Keep items that you use a lot in easy-to-reach places. If you need to reach something above you, use a strong step stool that has a grab bar. Keep electrical cords out of the way. Do not use floor polish or wax that makes floors slippery. If you must use wax, use non-skid floor wax. Do not have throw rugs and other things on the floor that can make you trip. What can I do with my stairs? Do not leave any items on the stairs. Make sure that there are handrails on both sides of the stairs and use them. Fix handrails  that are broken or loose. Make sure that handrails are as long as the stairways. Check any carpeting to make sure that it is firmly attached to the stairs. Fix any carpet that is loose or worn. Avoid having throw rugs at the top or bottom of the stairs. If you do have throw rugs, attach them to the floor with carpet tape. Make sure that you have a light switch at the top of the stairs and the bottom of the stairs. If you do not have them, ask someone to add them for you. What else can I do to help prevent falls? Wear shoes that: Do not have high heels. Have rubber bottoms. Are comfortable and fit you well. Are closed at the toe. Do not wear sandals. If you use a stepladder: Make sure that it is fully opened. Do not climb a  closed stepladder. Make sure that both sides of the stepladder are locked into place. Ask someone to hold it for you, if possible. Clearly mark and make sure that you can see: Any grab bars or handrails. First and last steps. Where the edge of each step is. Use tools that help you move around (mobility aids) if they are needed. These include: Canes. Walkers. Scooters. Crutches. Turn on the lights when you go into a dark area. Replace any light bulbs as soon as they burn out. Set up your furniture so you have a clear path. Avoid moving your furniture around. If any of your floors are uneven, fix them. If there are any pets around you, be aware of where they are. Review your medicines with your doctor. Some medicines can make you feel dizzy. This can increase your chance of falling. Ask your doctor what other things that you can do to help prevent falls. This information is not intended to replace advice given to you by your health care provider. Make sure you discuss any questions you have with your health care provider. Document Released: 05/05/2009 Document Revised: 12/15/2015 Document Reviewed: 08/13/2014 Elsevier Interactive Patient Education  2017 [employer].

## 2021-07-05 ENCOUNTER — Encounter: Payer: Self-pay | Admitting: Emergency Medicine

## 2021-07-05 ENCOUNTER — Ambulatory Visit (INDEPENDENT_AMBULATORY_CARE_PROVIDER_SITE_OTHER): Payer: Medicare Other | Admitting: Emergency Medicine

## 2021-07-05 ENCOUNTER — Other Ambulatory Visit: Payer: Self-pay

## 2021-07-05 DIAGNOSIS — R918 Other nonspecific abnormal finding of lung field: Secondary | ICD-10-CM | POA: Diagnosis not present

## 2021-07-05 DIAGNOSIS — I2121 ST elevation (STEMI) myocardial infarction involving left circumflex coronary artery: Secondary | ICD-10-CM

## 2021-07-05 NOTE — Patient Instructions (Signed)
We will arrange for PET scan to further evaluate your small pulmonary nodules We will arrange for pulmonary function testing Follow Dr. Lamonte Sakai next available after your testing is completed so we can review the results together.

## 2021-07-05 NOTE — Progress Notes (Signed)
Subjective:    Patient ID: Ryan Garner, male    DOB: Mar 26, 1946, 75 y.o.   MRN: [id number]  HPI 75 yo man, hx minimal tobacco use (1 pack year), with hx A Fib, HTN, CAD and HFpEF. He had COVID 2 yrs ago, had the flu 2 weeks ago.  He is referred today to evaluate an abnormal CT chest.   CT-PA was performed 11/28 to evaluate acute on chronic dyspnea.  He reports that he feels a bit weak - he just had the flu. He had some exertional SOB with working on a fence yesterday. He has some intermittent cough, chronic sinus drainage, clear mucous.   CT-PA 11/28 reviewed by me shows no PE, some evidence PA enlargement, two RUL pleural based nodules, largest 1cm at the apex, some subpleural reticulation and subtle GG and bronchial wall thickening. The nodules look slightly larger than 01/20/2018   Review of Systems As per HPI  Past Medical History:  Diagnosis Date   A-fib ([place])    Acute on chronic diastolic CHF (congestive heart failure) ([place]) 07/21/2020   Hyperlipidemia    Hypertension    Metabolic syndrome    Prediabetes    STEMI (ST elevation myocardial infarction) ([place])      Family History  Problem Relation Age of Onset   Cancer Mother        lungs   Coronary artery disease Mother    Diabetes Brother    Heart attack Brother    Diabetes Maternal Grandmother    Arthritis Father      Social History   Socioeconomic History   Marital status: Married    Spouse name: [name]   Number of children: 2   Years of education: 3   Highest education level: Doctorate  Occupational History   Occupation: Theme park manager    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  Tobacco Use   Smoking status: Former    Types: Cigarettes    Quit date: 04/02/1959    Years since quitting: 62.3   Smokeless tobacco: Never  Vaping Use   Vaping Use: Never used  Substance and Sexual Activity   Alcohol use: No   Drug use: No   Sexual activity: Yes  Other Topics Concern    Not on file  Social History Narrative   Not on file   Social Determinants of Health   Financial Resource Strain: Low Risk    Difficulty of Paying Living Expenses: Not hard at all  Food Insecurity: No Food Insecurity   Worried About Charity fundraiser in the Last Year: Never true   [place] in the Last Year: Never true  Transportation Needs: No Transportation Needs   Lack of Transportation (Medical): No   Lack of Transportation (Non-Medical): No  Physical Activity: Insufficiently Active   Days of Exercise per Week: 2 days   Minutes of Exercise per Session: 40 min  Stress: No Stress Concern Present   Feeling of Stress : Only a little  Social Connections: Engineer, building services of Communication with Friends and Family: Twice a week   Frequency of Social Gatherings with Friends and Family: Twice a week   Attends Religious Services: More than 4 times per year   Active Member of [employer] or Organizations: Yes   Attends Music therapist: More than 4 times per year   Marital Status: Married  Human resources officer Violence: Not At Risk  Fear of Current or Ex-Partner: No   Emotionally Abused: No   Physically Abused: No   Sexually Abused: No    Has lived in [place],  [place]ived [place] a farm Has worked as a Theme park manager  Allergies  Allergen Reactions   Flecainide     Per patient dizziness, shortness of breath, blurred vision   Rofecoxib Other (See Comments)    (Vioxx)     Outpatient Medications Prior to Visit  Medication Sig Dispense Refill   albuterol (VENTOLIN HFA) 108 (90 Base) MCG/ACT inhaler Inhale 2 puffs into the lungs every 6 (six) hours as needed for wheezing or shortness of breath. 8 g 2   apixaban (ELIQUIS) 5 MG TABS tablet Take 1 tablet (5 mg total) by mouth 2 (two) times daily. 180 tablet 3   atorvastatin (LIPITOR) 80 MG tablet Take 1 tablet (80 mg total) by mouth daily. 90 tablet 2   azithromycin (ZITHROMAX Z-PAK) 250 MG tablet As directed 6 tablet 0    clopidogrel (PLAVIX) 75 MG tablet Take 1 tablet (75 mg total) by mouth daily with breakfast. 90 tablet 2   empagliflozin (JARDIANCE) 10 MG TABS tablet Take 1 tablet (10 mg total) by mouth daily before breakfast. 30 tablet 11   fluticasone (FLONASE) 50 MCG/ACT nasal spray Place 2 sprays into both nostrils daily. 16 g 6   furosemide (LASIX) 40 MG tablet Take 2 tablets by mouth once daily 180 tablet 3   metolazone (ZAROXOLYN) 5 MG tablet Take 1 tablet (5 mg total) by mouth once a week. Take 30 prior to taking Lasix 30 tablet 1   metoprolol tartrate (LOPRESSOR) 50 MG tablet Take 1 tablet (50 mg total) by mouth 2 (two) times daily. 180 tablet 3   Misc Natural Products (PROSTATE SUPPORT) 300-15 MG TABS Take 1 tablet by mouth daily. Super Beta Prostate     Multiple Vitamin (MULTIVITAMIN WITH MINERALS) TABS tablet Take 1 tablet by mouth daily.     nitroGLYCERIN (NITROSTAT) 0.4 MG SL tablet Place 1 tablet (0.4 mg total) under the tongue every 5 (five) minutes x 3 doses as needed for chest pain. 25 tablet 2   potassium chloride SA (KLOR-CON) 20 MEQ tablet Take 1 tablet (20 mEq total) by mouth daily. 30 tablet 1   triamcinolone cream (KENALOG) 0.1 % Apply 1 application topically 2 (two) times daily. 30 g 0   No facility-administered medications prior to visit.         Objective:   Physical Exam Vitals:   07/05/21 1357  BP: 112/64  Pulse: 78  Temp: 97.8 F (36.6 C)  TempSrc: Oral  SpO2: 97%  Weight: 297 lb 9.6 oz (135 kg)  Height: 5' 7.5" (1.715 m)    Gen: Pleasant, obese, in no distress,  normal affect  ENT: No lesions,  mouth clear,  oropharynx clear, no postnasal drip  Neck: No JVD, no stridor  Lungs: No use of accessory muscles, no crackles or wheezing on normal respiration, no wheeze on forced expiration  Cardiovascular: RRR, heart sounds normal, no murmur or gallops, 1-2+  peripheral edema  Musculoskeletal: No deformities, no cyanosis or clubbing  Neuro: alert, awake, non  focal  Skin: Warm, no lesions or rash      Assessment & Plan:   Lung nodules Right upper lobe pulmonary nodules, small and pleural-based, but larger than on his most recent scan from 2019.  At least moderate suspicion for possible malignancy.  Smoldering infectious or inflammatory processes also possible.  We talked about  next steps.  I think we should perform a PET scan to learn as much about the nodules as possible.  Based on that we will decide whether to pursue tissue diagnosis with bronchoscopy, follow serial imaging, or even possibly refer him for surgical resection.  We will arrange for pulmonary function testing as well.  We will arrange for PET scan to further evaluate your small pulmonary nodules We will arrange for pulmonary function testing Follow Dr. Lamonte Sakai next available after your testing is completed so we can review the results together.   Baltazar Apo, MD, PhD 07/05/2021, 2:32 PM [facility] [phone] or if no answer before 7:00PM call [phone] For any issues after 7:00PM please call eLink [phone]

## 2021-07-05 NOTE — Addendum Note (Signed)
Addended by: Gavin Potters R on: 07/05/2021 02:58 PM   Modules accepted: Orders

## 2021-07-05 NOTE — Assessment & Plan Note (Signed)
Right upper lobe pulmonary nodules, small and pleural-based, but larger than on his most recent scan from 2019.  At least moderate suspicion for possible malignancy.  Smoldering infectious or inflammatory processes also possible.  We talked about next steps.  I think we should perform a PET scan to learn as much about the nodules as possible.  Based on that we will decide whether to pursue tissue diagnosis with bronchoscopy, follow serial imaging, or even possibly refer him for surgical resection.  We will arrange for pulmonary function testing as well.  We will arrange for PET scan to further evaluate your small pulmonary nodules We will arrange for pulmonary function testing Follow Dr. Lamonte Sakai next available after your testing is completed so we can review the results together.

## 2021-07-13 ENCOUNTER — Ambulatory Visit (HOSPITAL_COMMUNITY)
Admission: RE | Admit: 2021-07-13 | Discharge: 2021-07-13 | Disposition: A | Discharge status: Home / Self Care | Payer: Medicare Other | Source: Ambulatory Visit | Attending: Emergency Medicine | Admitting: Emergency Medicine

## 2021-07-13 DIAGNOSIS — R918 Other nonspecific abnormal finding of lung field: Secondary | ICD-10-CM | POA: Diagnosis not present

## 2021-07-13 MED ORDER — FLUDEOXYGLUCOSE F - 18 (FDG) INJECTION
15.5240 | Freq: Once | INTRAVENOUS | Status: AC | PRN
  Administered 2021-07-13: 12:00:00 15.524 via INTRAVENOUS

## 2021-07-31 ENCOUNTER — Ambulatory Visit: Payer: Medicare Other | Admitting: Internal Medicine

## 2021-08-03 ENCOUNTER — Telehealth: Payer: Medicare Other

## 2021-08-04 ENCOUNTER — Ambulatory Visit (INDEPENDENT_AMBULATORY_CARE_PROVIDER_SITE_OTHER): Payer: Medicare Other | Admitting: Licensed Clinical Social Worker

## 2021-08-04 DIAGNOSIS — I10 Essential (primary) hypertension: Secondary | ICD-10-CM

## 2021-08-04 DIAGNOSIS — E782 Mixed hyperlipidemia: Secondary | ICD-10-CM

## 2021-08-04 DIAGNOSIS — I4819 Other persistent atrial fibrillation: Secondary | ICD-10-CM

## 2021-08-04 DIAGNOSIS — E1169 Type 2 diabetes mellitus with other specified complication: Secondary | ICD-10-CM
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
## 2021-08-04 NOTE — Patient Instructions (Addendum)
Visit Information  Patient goals:  Manage Emotions. Manage mobility needs. Complete ADLs as able  Timeframe:  Short-Term Goal Priority:  Medium Progress:  On Track Start Date:            08/04/21               Expected End Date:           10/27/21           Follow Up Date   09/25/21 at 10:00 AM   Manage Emotions; Manage mobility needs; Complete ADLs as able    Why is this important?   When you are stressed, down or upset, your body reacts too.  For example, your blood pressure may get higher; you may have a headache or stomachache.  When your emotions get the best of you, your body's ability to fight off cold and flu gets weak.  These steps will help you manage your emotions.     Patient Coping Skills: Completes ADLs daily as able Attends scheduled medical appointments Has support from his wife, Ryan Garner  Patient Deficits  Mobility challenges Some pain issues  Patient Goals:  In next 30 days, patient will: Attend scheduled medical appointments Talk with RNCM or LCSW about CCM program support Talk with LCSW about mobility issues of client -  Follow Up Plan: LCSW to call client on  09/25/21 at 10:00 AM to assess client needs at that time    Ryan Garner.Ryan Garner MSW, LCSW Licensed Clinical Social Worker [facility] Care Management [phone]

## 2021-08-08 IMAGING — DX DG CHEST 2V
2 series · 2 of 2 positions shown · non-contrast
Comparison: 02/28/2020

CLINICAL DATA: Exertional shortness of breath

EXAM:
CHEST - 2 VIEW

[chest pa]
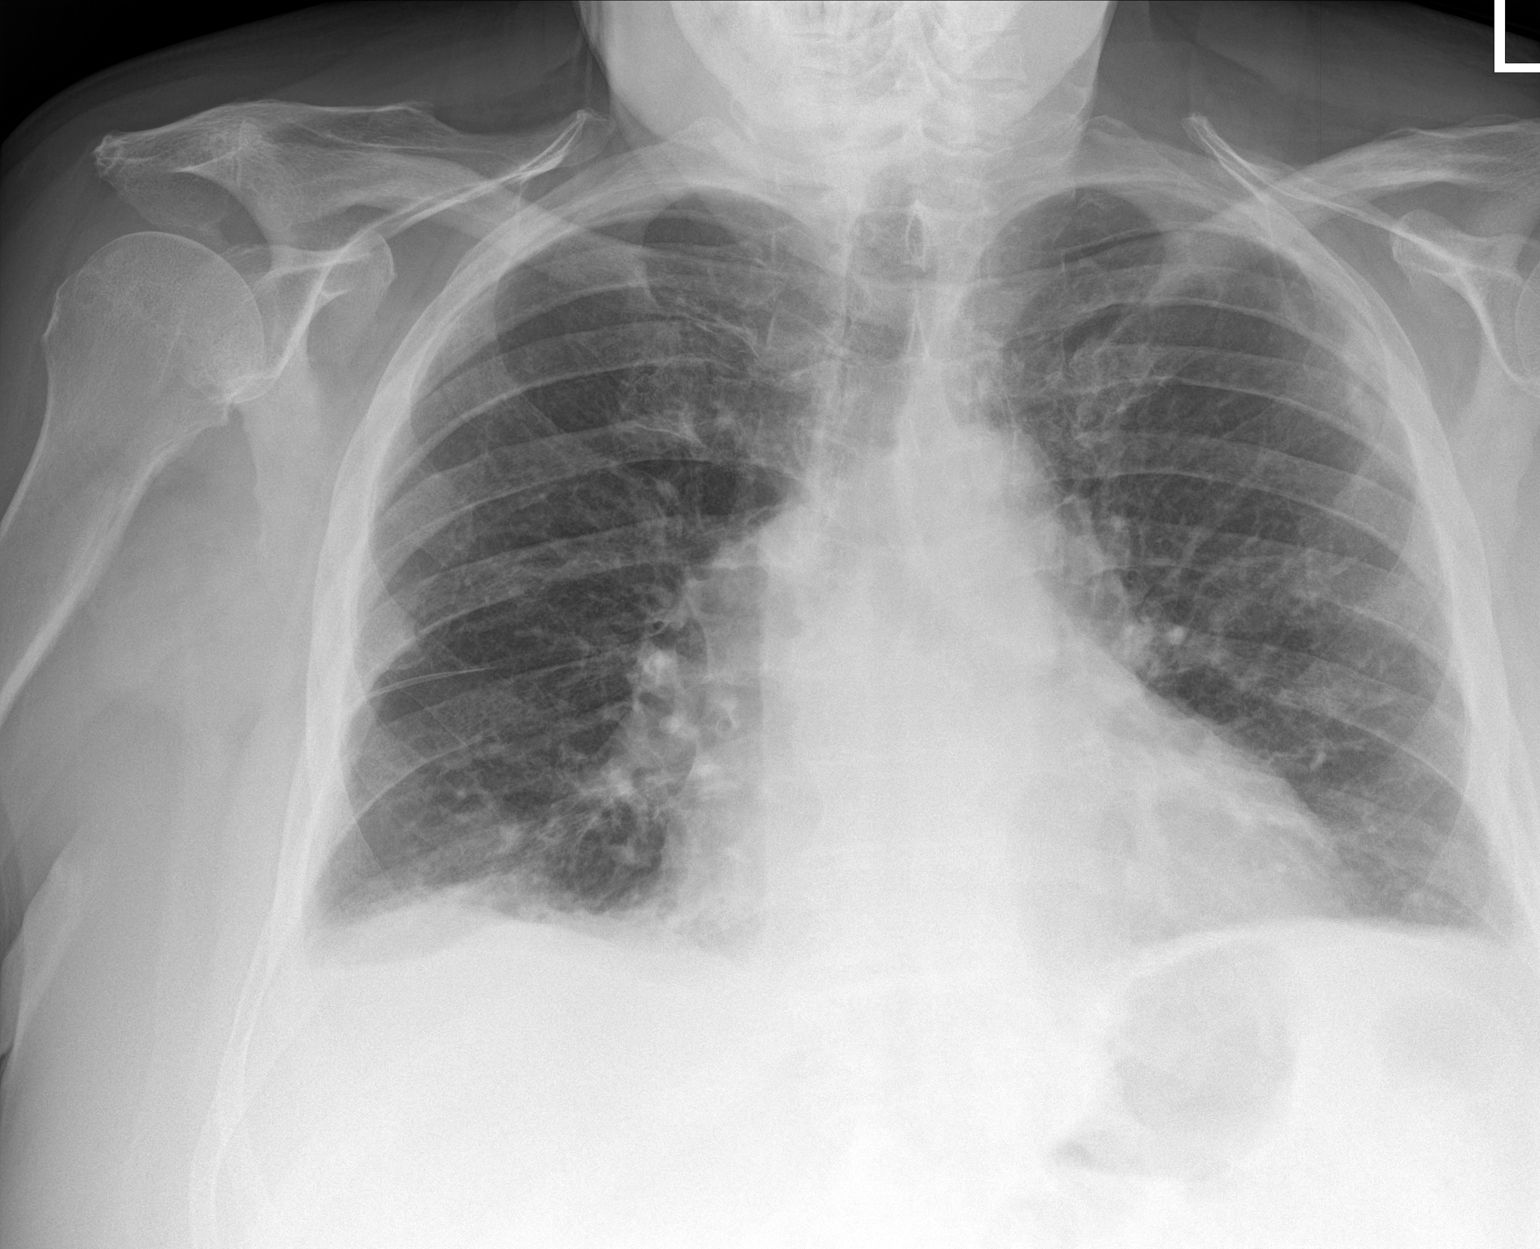

[chest lat]
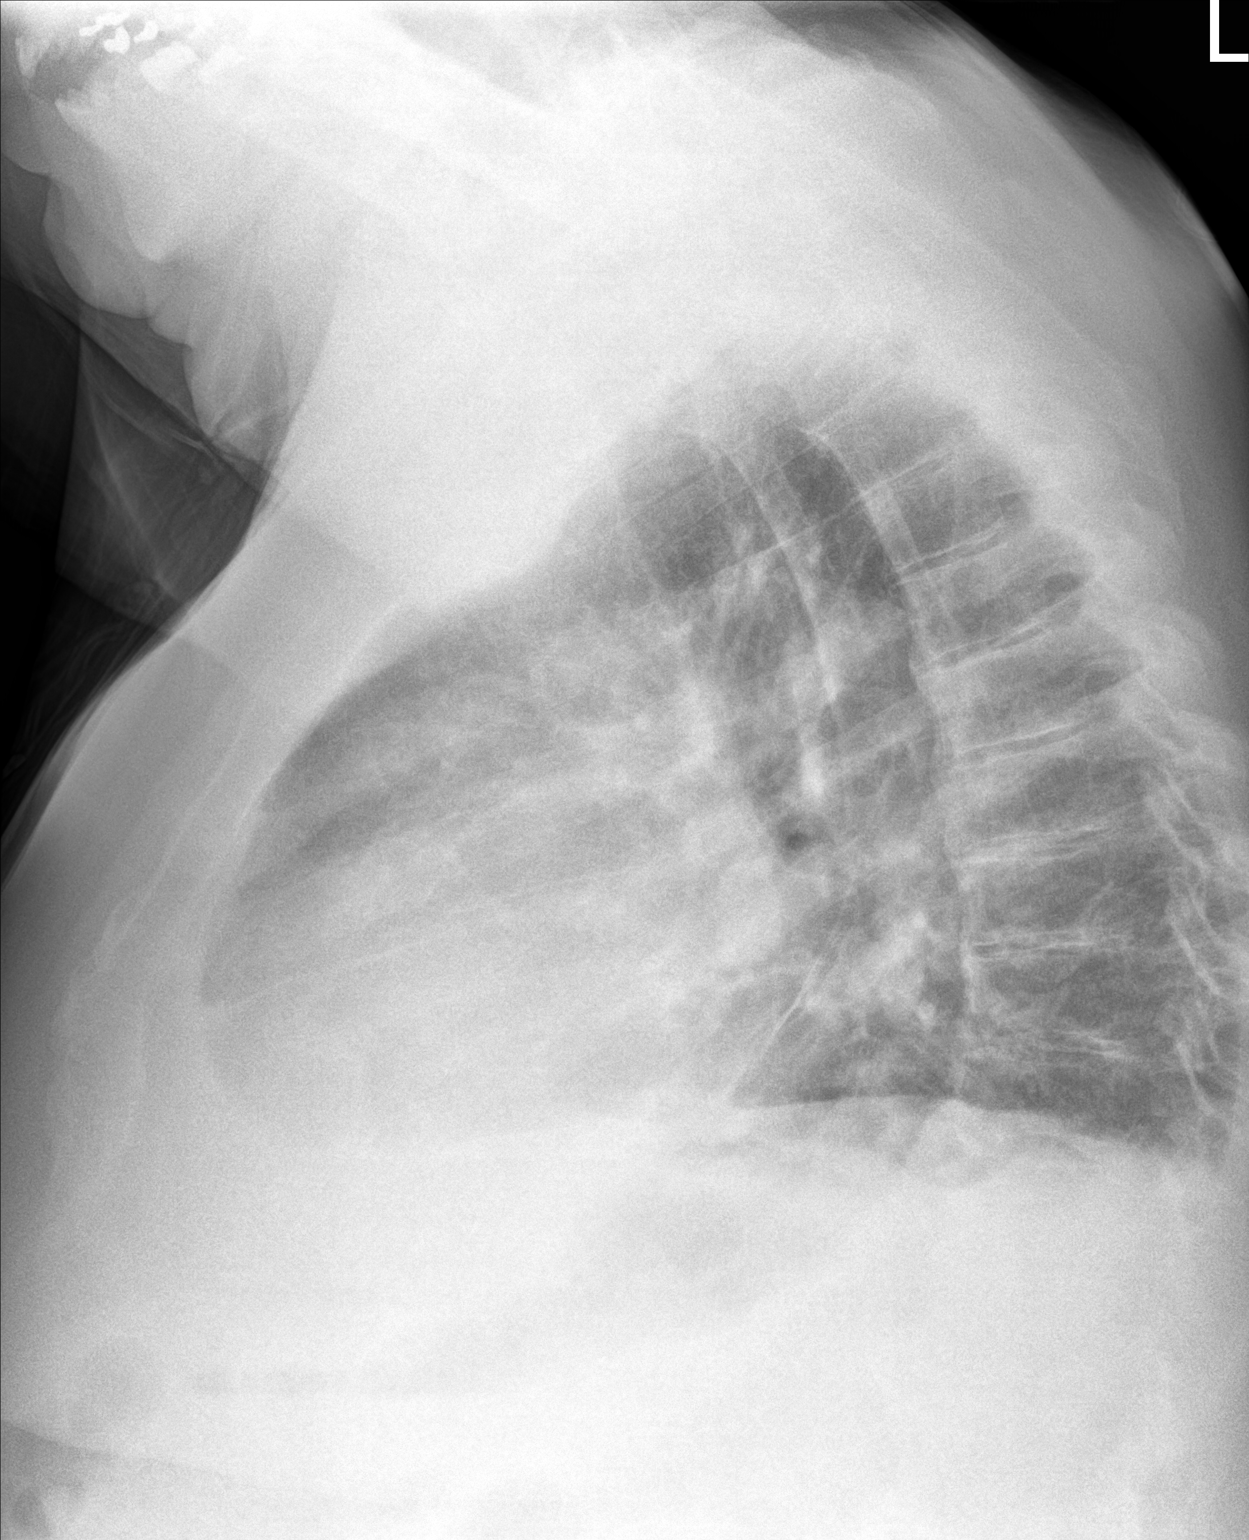

[2 of 2 positions shown; findings below may reference images not displayed]

FINDINGS: Mild right basilar scarring. No focal consolidation. No pleural
effusion or pneumothorax. Heart and mediastinal contours are
unremarkable.

No acute osseous abnormality.
IMPRESSION: No active cardiopulmonary disease.

## 2021-08-28 ENCOUNTER — Other Ambulatory Visit: Payer: Self-pay

## 2021-08-28 ENCOUNTER — Ambulatory Visit (INDEPENDENT_AMBULATORY_CARE_PROVIDER_SITE_OTHER): Payer: Medicare Other | Admitting: Family Medicine

## 2021-08-28 DIAGNOSIS — Z289 Immunization not carried out for unspecified reason: Secondary | ICD-10-CM

## 2021-08-28 DIAGNOSIS — Z1152 Encounter for screening for COVID-19: Secondary | ICD-10-CM

## 2021-08-28 DIAGNOSIS — Z28310 Unvaccinated for covid-19: Secondary | ICD-10-CM

## 2021-08-28 NOTE — Progress Notes (Signed)
Telephone visit  Subjective: [id number] COVID test PCP: [name], [name], MD ZDG:LOVFI [name] is a 76 y.o. male calls for telephone consult today. Patient provides verbal consent for consult held via phone.  Due to COVID-19 pandemic this visit was conducted virtually. This visit type was conducted due to national recommendations for restrictions regarding the COVID-19 Pandemic (e.g. social distancing, sheltering in place) in an effort to limit this patient's exposure and mitigate transmission in our community. All issues noted in this document were discussed and addressed.  A physical exam was not performed with this format.   Location of patient: home Location of provider: [facility] Others present for call: none  1. COVID test Patient needs a COVID test before Wednesday. Seeing pulmonary on Wed and has to have since not vaccinated. He may be undergoing radiation therapy.  Not sure how frequently he will need to be tested.  No symptoms.  No known exposures.   ROS: Per HPI  Allergies  Allergen Reactions   Flecainide     Per patient dizziness, shortness of breath, blurred vision   Rofecoxib Other (See Comments)    (Vioxx)   Past Medical History:  Diagnosis Date   A-fib ([place])    Acute on chronic diastolic CHF (congestive heart failure) ([place]) 07/21/2020   Hyperlipidemia    Hypertension    Metabolic syndrome    Prediabetes    STEMI (ST elevation myocardial infarction) ([place])     Current Outpatient Medications:    albuterol (VENTOLIN HFA) 108 (90 Base) MCG/ACT inhaler, Inhale 2 puffs into the lungs every 6 (six) hours as needed for wheezing or shortness of breath., Disp: 8 g, Rfl: 2   apixaban (ELIQUIS) 5 MG TABS tablet, Take 1 tablet (5 mg total) by mouth 2 (two) times daily., Disp: 180 tablet, Rfl: 3   atorvastatin (LIPITOR) 80 MG tablet, Take 1 tablet (80 mg total) by mouth daily., Disp: 90 tablet, Rfl: 2   azithromycin (ZITHROMAX Z-PAK) 250 MG tablet, As directed, Disp: 6 tablet,  Rfl: 0   clopidogrel (PLAVIX) 75 MG tablet, Take 1 tablet (75 mg total) by mouth daily with breakfast., Disp: 90 tablet, Rfl: 2   empagliflozin (JARDIANCE) 10 MG TABS tablet, Take 1 tablet (10 mg total) by mouth daily before breakfast., Disp: 30 tablet, Rfl: 11   fluticasone (FLONASE) 50 MCG/ACT nasal spray, Place 2 sprays into both nostrils daily., Disp: 16 g, Rfl: 6   furosemide (LASIX) 40 MG tablet, Take 2 tablets by mouth once daily, Disp: 180 tablet, Rfl: 3   metolazone (ZAROXOLYN) 5 MG tablet, Take 1 tablet (5 mg total) by mouth once a week. Take 30 prior to taking Lasix, Disp: 30 tablet, Rfl: 1   metoprolol tartrate (LOPRESSOR) 50 MG tablet, Take 1 tablet (50 mg total) by mouth 2 (two) times daily., Disp: 180 tablet, Rfl: 3   Misc Natural Products (PROSTATE SUPPORT) 300-15 MG TABS, Take 1 tablet by mouth daily. Super Beta Prostate, Disp: , Rfl:    Multiple Vitamin (MULTIVITAMIN WITH MINERALS) TABS tablet, Take 1 tablet by mouth daily., Disp: , Rfl:    nitroGLYCERIN (NITROSTAT) 0.4 MG SL tablet, Place 1 tablet (0.4 mg total) under the tongue every 5 (five) minutes x 3 doses as needed for chest pain., Disp: 25 tablet, Rfl: 2   potassium chloride SA (KLOR-CON) 20 MEQ tablet, Take 1 tablet (20 mEq total) by mouth daily., Disp: 30 tablet, Rfl: 1   triamcinolone cream (KENALOG) 0.1 %, Apply 1 application topically 2 (two)  times daily., Disp: 30 g, Rfl: 0  Assessment/ Plan: 76 y.o. male   Encounter for screening for COVID-19 - Plan: Novel Coronavirus, NAA ([facility])  COVID-19 vaccine series not administered - Plan: Novel Coronavirus, NAA ([facility])  I have placed standing orders for this patient.  I instructed him to contact our office when he intends on coming in for COVID testing each time.  He voiced good understanding of the plan and voiced great appreciation for the phone call.  He will bring in the fax number for which this test result needs to be sent prior to his appointment with  pulmonology   Start time: 8:36am End time: 8:41a  Total time spent on patient care (including telephone call/ virtual visit): 5 minutes  Ryan Garner, Ryan Garner [phone]

## 2021-08-29 LAB — SARS-COV-2, NAA 2 DAY TAT

## 2021-08-29 LAB — NOVEL CORONAVIRUS, NAA: SARS-CoV-2, NAA: NOT DETECTED

## 2021-08-30 ENCOUNTER — Ambulatory Visit (INDEPENDENT_AMBULATORY_CARE_PROVIDER_SITE_OTHER): Payer: Medicare Other | Admitting: Emergency Medicine

## 2021-08-30 ENCOUNTER — Other Ambulatory Visit: Payer: Self-pay

## 2021-08-30 ENCOUNTER — Encounter: Payer: Self-pay | Admitting: Emergency Medicine

## 2021-08-30 DIAGNOSIS — I2729 Other secondary pulmonary hypertension: Secondary | ICD-10-CM

## 2021-08-30 DIAGNOSIS — I5042 Chronic combined systolic (congestive) and diastolic (congestive) heart failure: Secondary | ICD-10-CM | POA: Diagnosis not present

## 2021-08-30 DIAGNOSIS — R918 Other nonspecific abnormal finding of lung field: Secondary | ICD-10-CM

## 2021-08-30 DIAGNOSIS — J449 Chronic obstructive pulmonary disease, unspecified: Secondary | ICD-10-CM | POA: Diagnosis not present

## 2021-08-30 DIAGNOSIS — I272 Pulmonary hypertension, unspecified: Secondary | ICD-10-CM | POA: Insufficient documentation

## 2021-08-30 LAB — PULMONARY FUNCTION TEST
DL/VA % pred: 102 %
DL/VA: 4.09 ml/min/mmHg/L
DLCO cor % pred: 83 %
DLCO cor: 19.17 ml/min/mmHg
DLCO unc % pred: 83 %
DLCO unc: 19.17 ml/min/mmHg
FEF 25-75 Post: 1.27 L/sec
FEF 25-75 Pre: 1.17 L/sec
FEF2575-%Change-Post: 8 %
FEF2575-%Pred-Post: 67 %
FEF2575-%Pred-Pre: 61 %
FEV1-%Change-Post: 5 %
FEV1-%Pred-Post: 72 %
FEV1-%Pred-Pre: 68 %
FEV1-Post: 1.95 L
FEV1-Pre: 1.84 L
FEV1FVC-%Change-Post: 4 %
FEV1FVC-%Pred-Pre: 97 %
FEV6-%Change-Post: 1 %
FEV6-%Pred-Post: 75 %
FEV6-%Pred-Pre: 74 %
FEV6-Post: 2.64 L
FEV6-Pre: 2.6 L
FEV6FVC-%Pred-Post: 107 %
FEV6FVC-%Pred-Pre: 107 %
FVC-%Change-Post: 1 %
FVC-%Pred-Post: 70 %
FVC-%Pred-Pre: 69 %
FVC-Post: 2.64 L
FVC-Pre: 2.6 L
Post FEV1/FVC ratio: 74 %
Post FEV6/FVC ratio: 100 %
Pre FEV1/FVC ratio: 71 %
Pre FEV6/FVC Ratio: 100 %
RV % pred: 86 %
RV: 2.12 L
TLC % pred: 72 %
TLC: 4.76 L

## 2021-08-30 MED ORDER — SPIRIVA RESPIMAT 1.25 MCG/ACT IN AERS: 2.0000 | INHALATION_SPRAY | Freq: Every day | RESPIRATORY_TRACT | 0 refills | Status: DC

## 2021-08-30 NOTE — Progress Notes (Signed)
Subjective:    Patient ID: Ryan Garner, male    DOB: [birth year], 76 y.o.   MRN: [id number]  HPI 76 yo man, hx minimal tobacco use (1 pack year), with hx A Fib, HTN, CAD and HFpEF. He had COVID 2 yrs ago, had the flu 2 weeks ago.  He is referred today to evaluate an abnormal CT chest.   CT-PA was performed 11/28 to evaluate acute on chronic dyspnea and secondary pulmonary hypertension He reports that he feels a bit weak - he just had the flu. He had some exertional SOB with working on a fence yesterday. He has some intermittent cough, chronic sinus drainage, clear mucous.   CT-PA 11/28 reviewed by me shows no PE, some evidence PA enlargement, two RUL pleural based nodules, largest 1cm at the apex, some subpleural reticulation and subtle GG and bronchial wall thickening. The nodules look slightly larger than 01/20/2018   ROV 08/30/21 --Ryan Garner is 9 with a minimal tobacco history.  He has atrial fibrillation, hypertension, CAD with diastolic CHF, OSA on CPAP.  I saw him for review of CT-PA in 06/19/2021 that identified 2 right upper lobe pleural-based nodules, largest 1 cm at the apex.  These were slightly larger than the prior scan from July 2019.  Based on this we planned for PET scan to better characterize, PFT which were done today. Today he reports He has some exertional limitation - gets SOB w walking 200 yrds. Rare cough due to some rhinitis. No real wheeze. No CP.  Good compliance w his CPAP machine. No snoring. Machine is old. Sleep study was many yrs ago. [place]. He has lost wt since his original dx > 70 lbs. Supplies through [employer]  He has albuterol available which he uses Has Flonase  PET scan performed 07/13/2021 reviewed by me shows persistent 11 mm and 9 mm right upper lobe pleural-based nodules that are unchanged in size.  There is no evidence of hypermetabolism.  No mediastinal or hilar adenopathy or any evidence of distant disease.  Pulmonary function testing performed  today and reviewed by me show spirometry consistent with mixed obstruction and restriction, FEV1 1.84 L (68% predicted), lung volumes are restricted, diffusion capacity is normal  TTE 06/02/21 >> LVEF 60-65%, no regional wall motion abnormalities, moderate LVH with indeterminate diastolic parameters.  Severely elevated PASP estimated 87 mmHg with flattening of the interventricular septum.  Mildly reduced RV function   Review of Systems As per HPI  Past Medical History:  Diagnosis Date   A-fib ([place])    Acute on chronic diastolic CHF (congestive heart failure) ([place]) 07/21/2020   Hyperlipidemia    Hypertension    Metabolic syndrome    Prediabetes    STEMI (ST elevation myocardial infarction) ([place])      Family History  Problem Relation Age of Onset   Cancer Mother        lungs   Coronary artery disease Mother    Diabetes Brother    Heart attack Brother    Diabetes Maternal Grandmother    Arthritis Father      Social History   Socioeconomic History   Marital status: Married    Spouse name: Ryan Garner   Number of children: 2   Years of education: 20   Highest education level: Doctorate  Occupational History   Occupation: Theme park manager    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  Tobacco Use   Smoking status: Former  Types: Cigarettes    Quit date: 04/02/1959    Years since quitting: 62.4   Smokeless tobacco: Never  Vaping Use   Vaping Use: Never used  Substance and Sexual Activity   Alcohol use: No   Drug use: No   Sexual activity: Yes  Other Topics Concern   Not on file  Social History Narrative   Not on file   Social Determinants of Health   Financial Resource Strain: Low Risk    Difficulty of Paying Living Expenses: Not hard at all  Food Insecurity: No Food Insecurity   Worried About Charity fundraiser in the Last Year: Never true   [place] in the Last Year: Never true  Transportation Needs: No Transportation  Needs   Lack of Transportation (Medical): No   Lack of Transportation (Non-Medical): No  Physical Activity: Inactive   Days of Exercise per Week: 0 days   Minutes of Exercise per Session: 0 min  Stress: Stress Concern Present   Feeling of Stress : To some extent  Social Connections: Engineer, building services of Communication with Friends and Family: Twice a week   Frequency of Social Gatherings with Friends and Family: Twice a week   Attends Religious Services: More than 4 times per year   Active Member of [employer] or Organizations: Yes   Attends Music therapist: More than 4 times per year   Marital Status: Married  Human resources officer Violence: Not At Risk   Fear of Current or Ex-Partner: No   Emotionally Abused: No   Physically Abused: No   Sexually Abused: No    Has lived in [place],  [place]ived on a farm Has worked as a Theme park manager  Allergies  Allergen Reactions   Flecainide     Per patient dizziness, shortness of breath, blurred vision   Rofecoxib Other (See Comments)    (Vioxx)     Outpatient Medications Prior to Visit  Medication Sig Dispense Refill   apixaban (ELIQUIS) 5 MG TABS tablet Take 1 tablet (5 mg total) by mouth 2 (two) times daily. 180 tablet 3   atorvastatin (LIPITOR) 80 MG tablet Take 1 tablet (80 mg total) by mouth daily. 90 tablet 2   azithromycin (ZITHROMAX Z-PAK) 250 MG tablet As directed 6 tablet 0   clopidogrel (PLAVIX) 75 MG tablet Take 1 tablet (75 mg total) by mouth daily with breakfast. 90 tablet 2   empagliflozin (JARDIANCE) 10 MG TABS tablet Take 1 tablet (10 mg total) by mouth daily before breakfast. 30 tablet 11   fluticasone (FLONASE) 50 MCG/ACT nasal spray Place 2 sprays into both nostrils daily. 16 g 6   furosemide (LASIX) 40 MG tablet Take 2 tablets by mouth once daily 180 tablet 3   metolazone (ZAROXOLYN) 5 MG tablet Take 1 tablet (5 mg total) by mouth once a week. Take 30 prior to taking Lasix 30 tablet 1   Misc Natural Products  (PROSTATE SUPPORT) 300-15 MG TABS Take 1 tablet by mouth daily. Super Beta Prostate     Multiple Vitamin (MULTIVITAMIN WITH MINERALS) TABS tablet Take 1 tablet by mouth daily.     nitroGLYCERIN (NITROSTAT) 0.4 MG SL tablet Place 1 tablet (0.4 mg total) under the tongue every 5 (five) minutes x 3 doses as needed for chest pain. 25 tablet 2   potassium chloride SA (KLOR-CON) 20 MEQ tablet Take 1 tablet (20 mEq total) by mouth daily. 30 tablet 1   triamcinolone cream (KENALOG) 0.1 % Apply  1 application topically 2 (two) times daily. 30 g 0   albuterol (VENTOLIN HFA) 108 (90 Base) MCG/ACT inhaler Inhale 2 puffs into the lungs every 6 (six) hours as needed for wheezing or shortness of breath. (Patient not taking: Reported on 08/30/2021) 8 g 2   metoprolol tartrate (LOPRESSOR) 50 MG tablet Take 1 tablet (50 mg total) by mouth 2 (two) times daily. 180 tablet 3   No facility-administered medications prior to visit.         Objective:   Physical Exam Vitals:   08/30/21 1150  BP: 116/86  Pulse: 74  Temp: (!) 97.5 F (36.4 C)  TempSrc: Oral  SpO2: 98%  Weight: 300 lb (136.1 kg)  Height: 5' 7.5" (1.715 m)    Gen: Pleasant, obese, in no distress,  normal affect  ENT: No lesions,  mouth clear,  oropharynx clear, no postnasal drip  Neck: No JVD, no stridor  Lungs: No use of accessory muscles, no crackles or wheezing on normal respiration, no wheeze on forced expiration  Cardiovascular: RRR, heart sounds normal, no murmur or gallops, 1+  peripheral edema  Musculoskeletal: No deformities, no cyanosis or clubbing  Neuro: alert, awake, non focal  Skin: Warm, no lesions or rash      Assessment & Plan:   Lung nodules 2 right upper lobe small pulmonary nodules, negative on PET scan.  We will plan to repeat his CT at the 45-month mark which would be June.  Other secondary pulmonary hypertension (HCC) Severe PAH noted on his echocardiogram.  No evidence of thromboembolic disease.  He does  have sleep apnea, need to confirm how well it is treated.  We will arrange for split-night sleep study and get him a new CPAP machine, adjust pressures if indicated.  He is being managed for his hypertension and this paroxysmal atrial fibrillation.  He has obstructive lung disease on PFT.  We will do a trial of scheduled bronchodilator therapy to see if he gets benefit.  If we manage all the potential underlying contributors and he still has elevated PASP, symptoms, pleural effusion then we may want to consider efficacy of further PAH work-up, targeted PAH therapy.  COPD (chronic obstructive pulmonary disease) ([place]) New identified obstruction on his PFTs.  He has not really used the albuterol, not sure he benefited from it.  We will do a trial of Spiriva to see if this changes his functional capacity, breathing.  I do suspect that his dyspnea is multifactorial.  CHF (congestive heart failure) ([place]) Following with Dr. Harrington Challenger   Time spent 41 minutes  Baltazar Apo, MD, PhD 08/30/2021, 12:17 PM [facility] [phone] or if no answer before 7:00PM call [phone] For any issues after 7:00PM please call eLink [phone]

## 2021-08-30 NOTE — Addendum Note (Signed)
Addended by: Gavin Potters R on: 08/30/2021 01:46 PM   Modules accepted: Orders

## 2021-08-30 NOTE — Patient Instructions (Signed)
Full PFT performed today. °

## 2021-08-30 NOTE — Assessment & Plan Note (Signed)
Severe PAH noted on his echocardiogram.  No evidence of thromboembolic disease.  He does have sleep apnea, need to confirm how well it is treated.  We will arrange for split-night sleep study and get him a new CPAP machine, adjust pressures if indicated.  He is being managed for his hypertension and this paroxysmal atrial fibrillation.  He has obstructive lung disease on PFT.  We will do a trial of scheduled bronchodilator therapy to see if he gets benefit.  If we manage all the potential underlying contributors and he still has elevated PASP, symptoms, pleural effusion then we may want to consider efficacy of further PAH work-up, targeted PAH therapy.

## 2021-08-30 NOTE — Progress Notes (Signed)
Full PFT performed today. °

## 2021-08-30 NOTE — Assessment & Plan Note (Signed)
Following with Dr. Harrington Challenger

## 2021-08-30 NOTE — Assessment & Plan Note (Signed)
New identified obstruction on his PFTs.  He has not really used the albuterol, not sure he benefited from it.  We will do a trial of Spiriva to see if this changes his functional capacity, breathing.  I do suspect that his dyspnea is multifactorial.

## 2021-08-30 NOTE — Patient Instructions (Addendum)
We reviewed your PET scan today.  We will plan to repeat your CT scan of the chest in June 2023 to follow small right upper lobe pulmonary nodules We reviewed your pulmonary function testing today.  We will try starting Spiriva Respimat 2 puffs once daily.  Keep track of whether this medication helps you over about 2 to 3 weeks.  If so call our office and let us know so we can send a prescription for this to your pharmacy. Okay to keep albuterol available to use 2 puffs if you needed for shortness of breath, chest tightness, wheezing. Please continue to use your CPAP every night.  We will arrange for a split-night sleep study to confirm your CPAP pressures and to allow [place] to order you a new device. Follow with Dr Lamonte Sakai in June 2023 to review your testing.  Call sooner if you have any new respiratory symptoms.

## 2021-08-30 NOTE — Assessment & Plan Note (Signed)
2 right upper lobe small pulmonary nodules, negative on PET scan.  We will plan to repeat his CT at the 35-month mark which would be June.

## 2021-09-07 ENCOUNTER — Telehealth: Payer: Self-pay | Admitting: Internal Medicine

## 2021-09-07 MED ORDER — APIXABAN 5 MG PO TABS: 5.0000 mg | ORAL_TABLET | Freq: Two times a day (BID) | ORAL | 1 refills | Status: DC

## 2021-09-07 NOTE — Telephone Encounter (Signed)
Pt notified that provider needs to sign paperwork for PAF- she will be in office 09/08/21. Pt aware that samples are here in the [place] office awaiting him  Eliquis 5 mg tablet Samples #28:  Lot# [id number] Exp: 05/2023

## 2021-09-07 NOTE — Addendum Note (Signed)
Addended by: Malen Gauze on: 09/07/2021 01:55 PM   Modules accepted: Orders

## 2021-09-07 NOTE — Telephone Encounter (Signed)
Patient calling back. He states the refill was received but the application for assistance was not. He would like to know if he can get an update on the application and samples of the eliquis. He states he is completely out of the medication.

## 2021-09-07 NOTE — Telephone Encounter (Signed)
Prescription refill request for Eliquis received. Indication: Atrial fib Last office visit: 05/29/21  Lizbeth Bark MD Scr: 1.32 on 06/08/21 Age: 76 Weight: 135.5kg  Based on above findings Eliquis 5mg  twice daily is the appropriate dose.  Refill approved.

## 2021-09-07 NOTE — Telephone Encounter (Signed)
° °  Pt c/o medication issue:  1. Name of Medication: apixaban (ELIQUIS) 5 MG TABS tablet  2. How are you currently taking this medication (dosage and times per day)? Take 1 tablet (5 mg total) by mouth 2 (two) times daily.  3. Are you having a reaction (difficulty breathing--STAT)?   4. What is your medication issue? Pt said Dr. Harrington Challenger sent application for him for pt assistance and he havent heard anything about it yet, he said he is on his last pill and need samples

## 2021-09-10 ENCOUNTER — Encounter: Payer: Medicare Other | Admitting: Pulmonary Disease

## 2021-09-15 ENCOUNTER — Encounter: Payer: Self-pay | Admitting: Internal Medicine

## 2021-09-15 ENCOUNTER — Other Ambulatory Visit (HOSPITAL_COMMUNITY)
Admission: RE | Admit: 2021-09-15 | Discharge: 2021-09-15 | Disposition: A | Discharge status: Home / Self Care | Payer: Medicare Other | Source: Ambulatory Visit | Attending: Internal Medicine | Admitting: Internal Medicine

## 2021-09-15 ENCOUNTER — Ambulatory Visit (INDEPENDENT_AMBULATORY_CARE_PROVIDER_SITE_OTHER): Payer: Medicare Other | Admitting: Internal Medicine

## 2021-09-15 ENCOUNTER — Telehealth: Payer: Self-pay | Admitting: *Deleted

## 2021-09-15 ENCOUNTER — Other Ambulatory Visit: Payer: Self-pay

## 2021-09-15 VITALS — BP 112/66 | HR 93 | Ht 68.0 in | Wt 303.2 lb

## 2021-09-15 DIAGNOSIS — R609 Edema, unspecified: Secondary | ICD-10-CM | POA: Insufficient documentation

## 2021-09-15 DIAGNOSIS — R0602 Shortness of breath: Secondary | ICD-10-CM

## 2021-09-15 LAB — CBC
HCT: 41.7 % (ref 39.0–52.0)
Hemoglobin: 14.1 g/dL (ref 13.0–17.0)
MCH: 31.3 pg (ref 26.0–34.0)
MCHC: 33.8 g/dL (ref 30.0–36.0)
MCV: 92.5 fL (ref 80.0–100.0)
Platelets: 228 10*3/uL (ref 150–400)
RBC: 4.51 MIL/uL (ref 4.22–5.81)
RDW: 12.8 % (ref 11.5–15.5)
WBC: 8.4 10*3/uL (ref 4.0–10.5)
nRBC: 0 % (ref 0.0–0.2)

## 2021-09-15 LAB — BASIC METABOLIC PANEL
Anion gap: 9 (ref 5–15)
BUN: 34 mg/dL — ABNORMAL HIGH (ref 8–23)
CO2: 32 mmol/L (ref 22–32)
Calcium: 9.1 mg/dL (ref 8.9–10.3)
Chloride: 95 mmol/L — ABNORMAL LOW (ref 98–111)
Creatinine, Ser: 1.29 mg/dL — ABNORMAL HIGH (ref 0.61–1.24)
GFR, Estimated: 57 mL/min — ABNORMAL LOW (ref >60)
Glucose, Bld: 158 mg/dL — ABNORMAL HIGH (ref 70–99)
Potassium: 3.6 mmol/L (ref 3.5–5.1)
Sodium: 136 mmol/L (ref 135–145)

## 2021-09-15 LAB — BRAIN NATRIURETIC PEPTIDE: B Natriuretic Peptide: 447 pg/mL — ABNORMAL HIGH (ref 0.0–100.0)

## 2021-09-15 NOTE — Progress Notes (Signed)
ID:  Ryan Garner, DOB 1945-10-14, MRN [id number]  PCP:  [name], [name], MD  Cardiologist:   Dorris Carnes, MD   Pt presents for follow up of CHF  History of Present Illness: Ryan Garner is a 76 y.o. male with a history of HTN, HL, pre DM, morbid obesity, atrial fibrillation and CAD    I  saw him in consult in Aug 2021.  He was [facility] to [facility] on [facility]/30/20 with CP and STEMI.  Underwent cath with PTCA/DES of LCx    Flecanide stopped  He was seen by Beckie Salts (EP  plan for rate control and anticoagulatoin  Not a candidate for Tikosyn.  Amiodarone also not felt to be a good choice.    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  Denied CP    Denies PND      Allergies:   Flecainide and Rofecoxib   Past Medical History:  Diagnosis Date   A-fib ([place])    Acute on chronic diastolic CHF (congestive heart failure) ([place]) 07/21/2020   Hyperlipidemia    Hypertension    Metabolic syndrome    Prediabetes    STEMI (ST elevation myocardial infarction) [facility])     Past Surgical History:  Procedure Laterality Date   APPENDECTOMY     CHOLECYSTECTOMY N/A 03/02/2020   Procedure: LAPAROSCOPIC CHOLECYSTECTOMY;  Surgeon: Aviva Signs, MD;  Location: [facility];  Service: General;  Laterality: N/A;   CORONARY STENT INTERVENTION N/A 04/30/2020   Procedure: CORONARY STENT INTERVENTION;  Surgeon: Martinique, Peter M, MD;  Location: [place] LAB;  Service: Cardiovascular;  Laterality:  N/A;   CORONARY/GRAFT ACUTE MI REVASCULARIZATION N/A 04/30/2020   Procedure: Coronary/Graft Acute MI Revascularization;  Surgeon: Martinique, Peter M, MD;  Location: [facility] CV LAB;  Service: Cardiovascular;  Laterality: N/A;   Eyelid Surgery Bilateral    HERNIA REPAIR     knee tendons repair     LEFT HEART CATH AND CORONARY ANGIOGRAPHY N/A 04/30/2020   Procedure: LEFT HEART CATH AND CORONARY ANGIOGRAPHY;  Surgeon: Martinique, Peter M, MD;  Location: [place] CV LAB;  Service: Cardiovascular;  Laterality: N/A;   ROTATOR CUFF REPAIR Bilateral    TONSILLECTOMY       Social History:  The patient  reports that he quit smoking about 62 years ago. His smoking use included cigarettes. He has never used smokeless tobacco. He reports that he does not drink alcohol and does not use drugs.   Family History:  The patient's family history includes Arthritis in his father; Cancer in his mother; Coronary artery disease in his mother; Diabetes in his brother and maternal grandmother; Heart attack in his brother.    ROS:  Please  see the history of present illness. All other systems are reviewed and  Negative to the above problem except as noted.    PHYSICAL EXAM: VS:  BP 112/66    Pulse 93    Ht 5\' 8"  (1.727 m)    Wt (!) 303 lb 3.2 oz (137.5 kg)    SpO2 96%    BMI 46.10 kg/m   GEN: Morbidly obese 76 yo in no acute distress  HEENT: normal  Neck: Neck is full Cardiac: Irreg irreg ; no murmurs   1 + LE edema (wearing support hose) Respiratory:  Clear to auscultation GI: soft, obese  Nontender  MS: no deformity Moving all extremities   Skin: warm and dry, no rash Neuro:  Strength and sensation are intact Psych: euthymic mood, full affect   EKG:  EKG is not done today   Cardiac Cath 10/9    Cath: 04/30/20   Ramus lesion is 75% stenosed. Mid Cx lesion is 99% stenosed. Post intervention, there is a 0% residual stenosis. A drug-eluting stent was successfully placed using a STENT RESOLUTE ONYX  2.5X34. The left ventricular systolic function is normal. LV end diastolic pressure is mildly elevated. The left ventricular ejection fraction is 55-65% by visual estimate.   1. 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 Good LV systolic function 3. Elevated LVEDP 4. Successful PCI of the mid to distal LCx with DES x 1.   Plan: ASA 81 mg daily for one month. Plavix 75 mg daily for one year. May resume Eliquis tomorrow. IV diuresis. Will need to stop Flecainide. Consider alternative AAD therapy.   ECHO   05/01/20  1. Anterior and anterolateral hypokinesis. Left ventricular ejection fraction, by estimation, is 60 to 65%. The left ventricle has normal function. The left ventricle has no regional wall motion abnormalities. Left ventricular diastolic parameters are indeterminate. 2. Right ventricular systolic function is normal. The right ventricular size is normal. There is severely elevated pulmonary artery systolic pressure. 3. Left atrial size was severely dilated. 4. The mitral valve is normal in structure. Mild to moderate mitral valve regurgitation. No evidence of mitral stenosis. 5. The aortic valve is calcified. There is moderate calcification of the aortic valve. There is moderate thickening of the aortic valve. Aortic valve regurgitation is mild. No aortic stenosis is present. Aortic valve area, by VTI measures 1.04 cm. Aortic valve mean gradient measures 15.2 mmHg. Aortic valve Vmax measures 2.70 m/s. 6. Aortic dilatation noted. There is mild dilatation of the ascending aorta, measuring 38 mm. 7. The inferior vena cava is dilated in size with >50% respiratory variability, suggesting right atrial pressure of 8 mmHg.  Lipid Panel    Component Value Date/Time   CHOL 106 10/27/2020 1121   TRIG 65 10/27/2020 1121   TRIG 74 08/27/2014 0826   HDL 41 10/27/2020 1121   HDL 56 08/27/2014 0826   CHOLHDL 2.6 10/27/2020 1121   CHOLHDL 4.1 05/01/2020 1759    VLDL 13 05/01/2020 1759   LDLCALC 51 10/27/2020 1121   LDLCALC 78 11/13/2013 0911      Wt Readings from Last 3 Encounters:  09/15/21 (!) 303 lb 3.2 oz (137.5 kg)  08/30/21 300 lb (136.1 kg)  07/05/21 297 lb 9.6 oz (135 kg)      ASSESSMENT AND PLAN: 1  Chronic diastolic CHF   volume remains increased. Weight has crept up    WIll get labs today   May be able to adjust diuretic  Reviewed salt restrictions      2  Atrial fib    Pt remains rate controlled    Seen by EP   Not a candidate for Tikosyn, not felt to be a good canditate for amio or flecanide   Has seen G Lovena Le   Will reivew     3    CAD   patient is status post NSTEMI with intervention to the circumflex in October 2021.  II am not convince d that current dyspnea represents angina       4.  HL  Keep on atorvastatin.    Current medicines are reviewed at length with the patient today.  The patient does not have concerns regarding medicines.  Signed, Dorris Carnes, MD  09/15/2021 2:32 PM    [facility] [address], [place], [place]  [address] Phone: [phone]; Fax: [phone]

## 2021-09-15 NOTE — Telephone Encounter (Signed)
Pt notified that he has been approved for the [employer] patient assistance program ( Eliquis).

## 2021-09-15 NOTE — Patient Instructions (Signed)
Medication Instructions:  Your physician recommends that you continue on your current medications as directed. Please refer to the Current Medication list given to you today.  *If you need a refill on your cardiac medications before your next appointment, please call your pharmacy*   Lab Work: Your physician recommends that you return for lab work in: Today   If you have labs (blood work) drawn today and your tests are completely normal, you will receive your results only by: MyChart Message (if you have MyChart) OR A paper copy in the mail If you have any lab test that is abnormal or we need to change your treatment, we will call you to review the results.   Testing/Procedures: NONE    Follow-Up: At [facility], you and your health needs are our priority.  As part of our continuing mission to provide you with exceptional heart care, we have created designated Provider Care Teams.  These Care Teams include your primary Cardiologist (physician) and Advanced Practice Providers (APPs -  Physician Assistants and Nurse Practitioners) who all work together to provide you with the care you need, when you need it.  We recommend signing up for the patient portal called "MyChart".  Sign up information is provided on this After Visit Summary.  MyChart is used to connect with patients for Virtual Visits (Telemedicine).  Patients are able to view lab/test results, encounter notes, upcoming appointments, etc.  Non-urgent messages can be sent to your provider as well.   To learn more about what you can do with MyChart, go to [email or web address].    Your next appointment:    July   The format for your next appointment:   In Person  Provider:   Dorris Carnes, MD    Other Instructions Thank you for choosing [facility]!

## 2021-09-17 ENCOUNTER — Other Ambulatory Visit: Payer: Self-pay | Admitting: Internal Medicine

## 2021-09-18 ENCOUNTER — Other Ambulatory Visit: Payer: Self-pay

## 2021-09-18 ENCOUNTER — Ambulatory Visit (HOSPITAL_BASED_OUTPATIENT_CLINIC_OR_DEPARTMENT_OTHER): Payer: Medicare Other | Attending: Emergency Medicine | Admitting: Pulmonary Disease

## 2021-09-18 VITALS — Ht 68.0 in | Wt 298.0 lb

## 2021-09-18 DIAGNOSIS — G4733 Obstructive sleep apnea (adult) (pediatric): Secondary | ICD-10-CM | POA: Diagnosis not present

## 2021-09-18 DIAGNOSIS — R918 Other nonspecific abnormal finding of lung field: Secondary | ICD-10-CM | POA: Diagnosis not present

## 2021-09-20 DIAGNOSIS — L814 Other melanin hyperpigmentation: Secondary | ICD-10-CM | POA: Diagnosis not present

## 2021-09-20 DIAGNOSIS — D225 Melanocytic nevi of trunk: Secondary | ICD-10-CM | POA: Diagnosis not present

## 2021-09-20 DIAGNOSIS — L821 Other seborrheic keratosis: Secondary | ICD-10-CM | POA: Diagnosis not present

## 2021-09-20 DIAGNOSIS — L570 Actinic keratosis: Secondary | ICD-10-CM | POA: Diagnosis not present

## 2021-09-21 NOTE — Procedures (Signed)
° ° ° °  Patient Name: Ryan Garner, Ryan Garner Date: 09/18/2021 Gender: Male D.O.B: March 04, 1946 Age (years): 75 Referring Provider: Baltazar Apo Height (inches): 70 Interpreting Physician: Chesley Mires MD, ABSM Weight (lbs): 298 RPSGT: Zadie Rhine BMI: 105 MRN: [id number] Neck Size: 17.50  CLINICAL INFORMATION Sleep Study Type: NPSG  Indication for sleep study: Obesity, OSA  Epworth Sleepiness Score: 5  SLEEP STUDY TECHNIQUE As per the AASM Manual for the Scoring of Sleep and Associated Events v2.3 (April 2016) with a hypopnea requiring 4% desaturations.  The channels recorded and monitored were frontal, central and occipital EEG, electrooculogram (EOG), submentalis EMG (chin), nasal and oral airflow, thoracic and abdominal wall motion, anterior tibialis EMG, snore microphone, electrocardiogram, and pulse oximetry.  MEDICATIONS Medications self-administered by patient taken the night of the study : N/A  SLEEP ARCHITECTURE The study was initiated at 10:16:05 PM and ended at 4:06:52 AM.  Sleep onset time was 21.6 minutes and the sleep efficiency was 36.9%%. The total sleep time was 129.5 minutes.  Stage REM latency was N/A minutes.  The patient spent 37.8%% of the night in stage N1 sleep, 62.2%% in stage N2 sleep, 0.0%% in stage N3 and 0% in REM.  Alpha intrusion was absent.  Supine sleep was 100.00%. He had difficulty initiating and maintaining sleep due to respiratory events.  RESPIRATORY PARAMETERS The overall apnea/hypopnea index (AHI) was 84.8 per hour. There were 55 total apneas, including 16 obstructive, 33 central and 6 mixed apneas. There were 128 hypopneas and 0 RERAs.  The AHI during Stage REM sleep was N/A per hour.  AHI while supine was 84.8 per hour.  The mean oxygen saturation was 90.9%. The minimum SpO2 during sleep was 76.0%.  Soft snoring was noted during this study. He did not meet split night study criteria due to insufficient amount of sleep during  the initial portion of the study.  CARDIAC DATA The 2 lead EKG demonstrated sinus rhythm. The mean heart rate was 68.7 beats per minute. Other EKG findings include: None.  LEG MOVEMENT DATA The total PLMS were 0 with a resulting PLMS index of 0.0. Associated arousal with leg movement index was 0.0 .  IMPRESSIONS - Severe obstructive sleep apnea with an AHI of 84.8 and SpO2 low of 76%. - Supplemental oxygen was not applied during this study.  DIAGNOSIS - Obstructive Sleep Apnea (G47.33)  RECOMMENDATIONS - Given the severity of his sleep apnea and oxygen desaturation he should return to the sleep lab for a CPAP titration study.   - Avoid alcohol, sedatives and other CNS depressants that may worsen sleep apnea and disrupt normal sleep architecture. - Sleep hygiene should be reviewed to assess factors that may improve sleep quality. - Weight management and regular exercise should be initiated or continued if appropriate.  [Electronically signed] 09/21/2021 04:00 PM  Chesley Mires MD, ABSM Diplomate, American Board of Sleep Medicine NPI: [id number]  [place] PH: [phone]   FX: [phone] [place]

## 2021-09-25 ENCOUNTER — Ambulatory Visit (INDEPENDENT_AMBULATORY_CARE_PROVIDER_SITE_OTHER): Payer: Medicare Other | Admitting: Licensed Clinical Social Worker

## 2021-09-25 DIAGNOSIS — R0602 Shortness of breath: Secondary | ICD-10-CM

## 2021-09-25 DIAGNOSIS — I4819 Other persistent atrial fibrillation: Secondary | ICD-10-CM

## 2021-09-25 DIAGNOSIS — E782 Mixed hyperlipidemia: Secondary | ICD-10-CM

## 2021-09-25 DIAGNOSIS — I10 Essential (primary) hypertension: Secondary | ICD-10-CM

## 2021-09-25 DIAGNOSIS — E1169 Type 2 diabetes mellitus with other specified complication: Secondary | ICD-10-CM

## 2021-09-25 NOTE — Patient Instructions (Addendum)
Visit Information ? ?Patient Goals: Manage Emotions. Manage mobility needs. Complete ADLs as able ? ?Timeframe:  Short-Term Goal ?Priority:  Medium ?Progress:  On Track ?Start Date:            09/25/21                 ?Expected End Date:           12/25/21          ? ?Follow Up Date   11/16/21 at 3:00 PM ?  ?Manage Emotions; Manage mobility needs; Complete ADLs as able  ?  ?Why is this important?   ?When you are stressed, down or upset, your body reacts too.  ?For example, your blood pressure may get higher; you may have a headache or stomachache.  ?When your emotions get the best of you, your body's ability to fight off cold and flu gets weak.  ?These steps will help you manage your emotions.    ? ?Patient Coping Skills: ?Completes ADLs daily as able ?Attends scheduled medical appointments ?Has support from his wife, [name] ? ?Patient Deficits ? ?Mobility challenges ?Some pain issues ? ?Patient Goals:  ?In next 30 days, patient will: ?Attend scheduled medical appointments ?Talk with RNCM or LCSW about CCM program support ?Talk with LCSW about mobility issues of client ?-  ?Follow Up Plan: LCSW to call client on  11/16/21 at 3:00 PM to assess client needs at that time   ? ?Norva Riffle.Momen Ham MSW, LCSW ?Licensed Clinical Social Worker ?[facility] ?[phone] ?
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
## 2021-09-28 ENCOUNTER — Telehealth: Payer: Self-pay

## 2021-09-28 NOTE — Telephone Encounter (Signed)
-----   Message from Fay Records, MD sent at 09/28/2021  3:46 PM EST ----- ?Regarding: RE: clarify zaroxolyn sig ?One time per week to start ?----- Message ----- ?From: Bernita Raisin, RN ?Sent: 09/28/2021   9:00 AM EST ?To: Fay Records, MD ?Subject: clarify zaroxolyn sig                         ? ?You want pt to take zaroxolyn 2.5 mg 30 minutes before lasix. QUESTION: one time or once a week? ?----- Message ----- ?From: Nuala Alpha, LPN ?Sent: 09/28/2021   7:43 AM EST ?To: [facility] Triage ? ?[facility] pt  ? ?----- Message ----- ?From: Fay Records, MD ?Sent: 09/27/2021   9:41 PM EST ?To: [facility] Triage ? ?Reviewed labs/reflecting on changes  ?CBC is normal  ?Fluis is up some      ?Could try zaroxylyn 2.5 mg    Take 30 min before lasix    Take initially one weed ? ?F/U BMET in 3 wks    ? ? ? ?

## 2021-09-28 NOTE — Telephone Encounter (Signed)
I spoke with patient and he confirmed he already takes Zaroxolyn 5 mg weekly. ? ? ? ?Please advise ?

## 2021-10-05 MED ORDER — METOLAZONE 5 MG PO TABS: ORAL_TABLET | ORAL | 3 refills | Status: DC

## 2021-10-05 NOTE — Telephone Encounter (Signed)
I would increase Zaroxylyn then to 5 mg 3x per week    ? ?Weights daily.     ? ?Labs (BMET ) in 10 days   ?

## 2021-10-05 NOTE — Telephone Encounter (Signed)
I will resend to Dr.Ross to clarify medication dose. Patient is already taking Zaroxolyn 5 mg weekly. ?

## 2021-10-05 NOTE — Telephone Encounter (Signed)
Patient agrees to increase zaroxolyn to 5 mg 3 x a week and repeat bmet in 10 days.  ?

## 2021-10-10 ENCOUNTER — Telehealth: Payer: Self-pay | Admitting: Emergency Medicine

## 2021-10-10 DIAGNOSIS — G4733 Obstructive sleep apnea (adult) (pediatric): Secondary | ICD-10-CM

## 2021-10-11 NOTE — Telephone Encounter (Signed)
Called patient but he did not answer. Left message for him to call us back.  

## 2021-10-12 IMAGING — DX DG CHEST 2V
2 series · 2 of 2 positions shown · non-contrast
Comparison: 05/18/2020

CLINICAL DATA: Wheezing

EXAM:
CHEST - 2 VIEW

[chest pa]
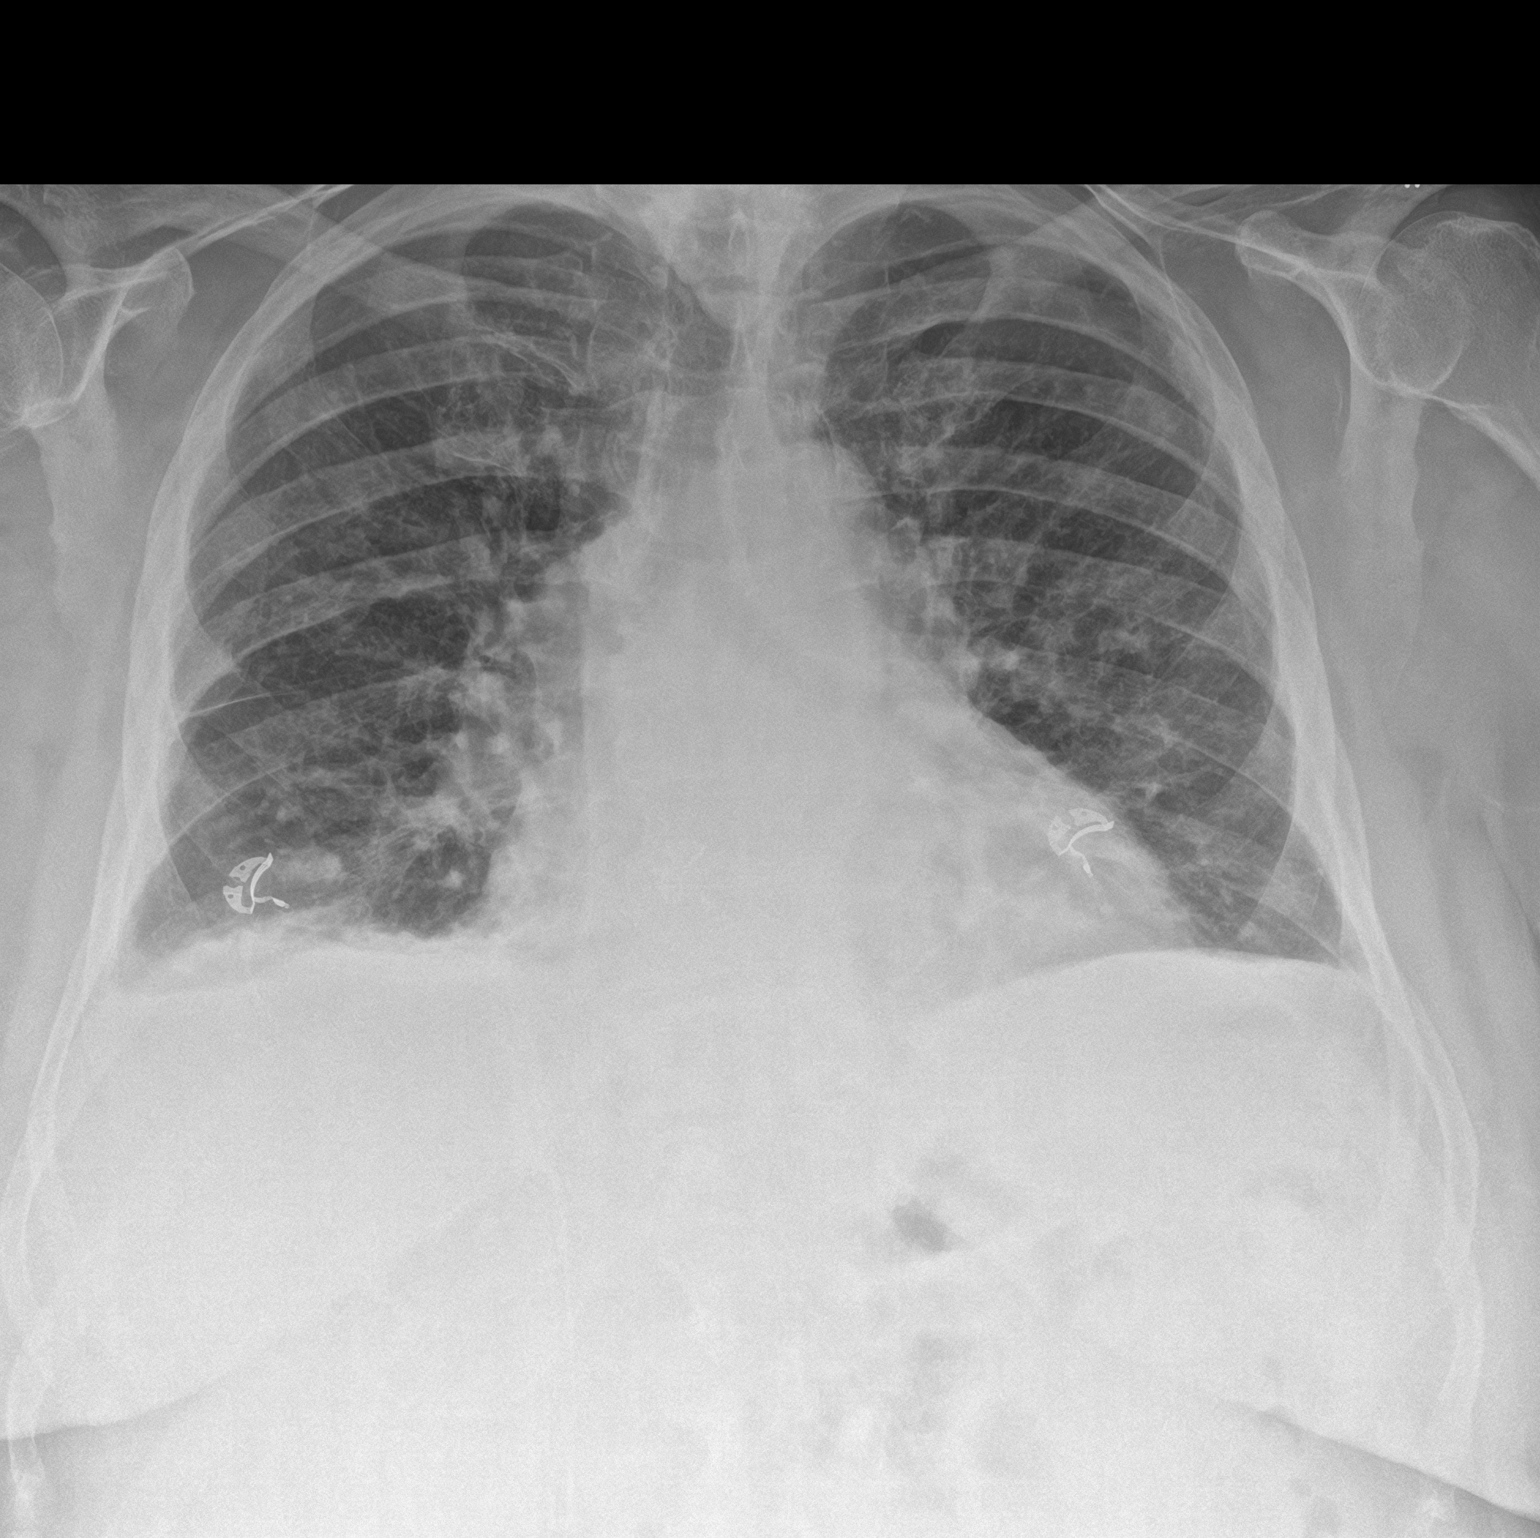

[chest lat]
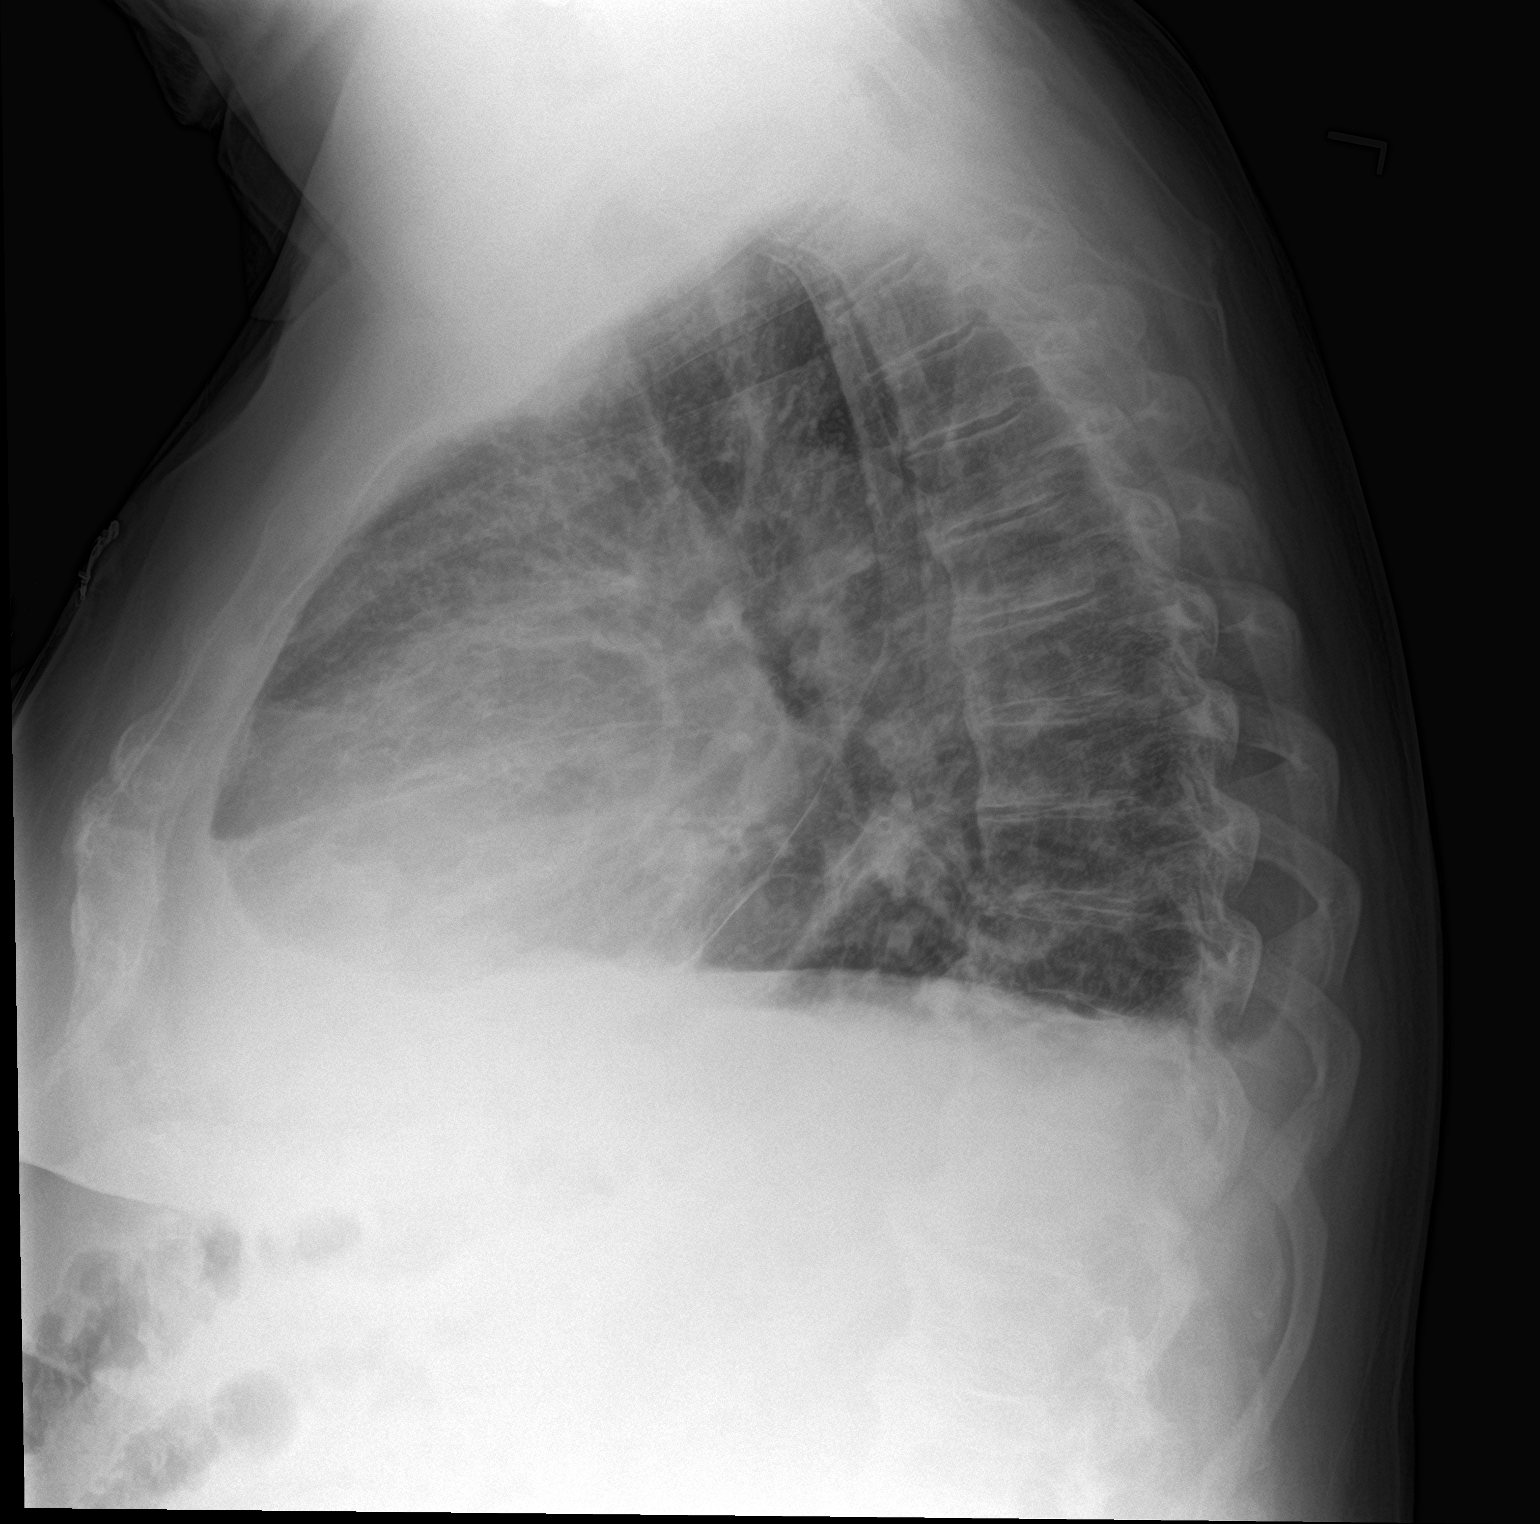

[2 of 2 positions shown; findings below may reference images not displayed]

FINDINGS: Heart is upper limits normal in size. Right basilar density again
noted, stable, likely scarring. No confluent airspace opacities
otherwise. No effusions or acute bony abnormality.
IMPRESSION: Right basilar scarring.

No acute cardiopulmonary disease.

## 2021-10-12 NOTE — Telephone Encounter (Signed)
Called patient again but he did not answer. Left message for him to call back.  ?

## 2021-10-13 NOTE — Telephone Encounter (Signed)
Called and spoke with patient. He verbalized understanding of results. He wishes to proceed with the cpap machine order. He does not have a preference of a DME company.  ? ?Order has been placed for cpap machine. He is aware of the process and will call us back if he has any questions.  ? ?Nothing further needed at time of call.  ?

## 2021-10-13 NOTE — Telephone Encounter (Signed)
I reviewed his sleep study.  It does not look like they did CPAP titration. 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 ?I think we should order him new CPAP machine with AutoSet 10-20 cmH2O ?

## 2021-10-13 NOTE — Telephone Encounter (Signed)
Spoke with pt and he would like to know results of sleep study done on 09/21/21. Dr. Lamonte Sakai please advise.  ?

## 2021-10-17 ENCOUNTER — Other Ambulatory Visit (HOSPITAL_COMMUNITY)
Admission: RE | Admit: 2021-10-17 | Discharge: 2021-10-17 | Disposition: A | Discharge status: Home / Self Care | Payer: Medicare Other | Source: Ambulatory Visit | Attending: Internal Medicine | Admitting: Internal Medicine

## 2021-10-17 DIAGNOSIS — Z79899 Other long term (current) drug therapy: Secondary | ICD-10-CM | POA: Insufficient documentation

## 2021-10-17 LAB — BASIC METABOLIC PANEL
Anion gap: 13 (ref 5–15)
BUN: 39 mg/dL — ABNORMAL HIGH (ref 8–23)
CO2: 27 mmol/L (ref 22–32)
Calcium: 9.3 mg/dL (ref 8.9–10.3)
Chloride: 94 mmol/L — ABNORMAL LOW (ref 98–111)
Creatinine, Ser: 1.23 mg/dL (ref 0.61–1.24)
GFR, Estimated: >60 mL/min (ref >60)
Glucose, Bld: 169 mg/dL — ABNORMAL HIGH (ref 70–99)
Potassium: 3 mmol/L — ABNORMAL LOW (ref 3.5–5.1)
Sodium: 134 mmol/L — ABNORMAL LOW (ref 135–145)

## 2021-10-20 DIAGNOSIS — E1169 Type 2 diabetes mellitus with other specified complication: Secondary | ICD-10-CM

## 2021-10-20 DIAGNOSIS — I10 Essential (primary) hypertension: Secondary | ICD-10-CM

## 2021-10-20 DIAGNOSIS — E782 Mixed hyperlipidemia: Secondary | ICD-10-CM

## 2021-10-20 DIAGNOSIS — I4819 Other persistent atrial fibrillation: Secondary | ICD-10-CM

## 2021-10-25 ENCOUNTER — Telehealth: Payer: Self-pay | Admitting: Internal Medicine

## 2021-10-25 DIAGNOSIS — R609 Edema, unspecified: Secondary | ICD-10-CM

## 2021-10-25 DIAGNOSIS — Z79899 Other long term (current) drug therapy: Secondary | ICD-10-CM

## 2021-10-25 DIAGNOSIS — R0602 Shortness of breath: Secondary | ICD-10-CM

## 2021-10-25 MED ORDER — POTASSIUM CHLORIDE CRYS ER 20 MEQ PO TBCR: EXTENDED_RELEASE_TABLET | ORAL | 3 refills | Status: DC

## 2021-10-25 MED ORDER — NITROGLYCERIN 0.4 MG SL SUBL
0.4000 mg | SUBLINGUAL_TABLET | SUBLINGUAL | 2 refills | Status: DC | PRN
Start: 2021-10-25 — End: 2022-04-26

## 2021-10-25 NOTE — Telephone Encounter (Signed)
?  Pt is returning call. he said, he will be available after 11:30 am  ?

## 2021-10-25 NOTE — Telephone Encounter (Signed)
Patient is returning call to get test results and medication dosage change instructions. The patient asks that we leave a detailed message on his phone if he is not available  ?

## 2021-10-25 NOTE — Telephone Encounter (Signed)
Pt returning call regarding test results. Please advise ?

## 2021-10-25 NOTE — Telephone Encounter (Signed)
Fay Records, MD  ?10/19/2021 11:40 PM EDT   ?  ?Kidney function is stable  ?K is low    Take 40 KCL x 1   Then on days take Zaroxlyn take 40 mEq instead of 20   ?F/U BMET and BNP in 2 wks   ? ? ? ? ? ?Returned patient call,had to leave message. ?

## 2021-10-25 NOTE — Telephone Encounter (Signed)
Pt notified of results and medication changes. Pt had no questions or concerns at this time.  ?

## 2021-11-02 ENCOUNTER — Ambulatory Visit (INDEPENDENT_AMBULATORY_CARE_PROVIDER_SITE_OTHER): Payer: Medicare Other

## 2021-11-02 ENCOUNTER — Ambulatory Visit (INDEPENDENT_AMBULATORY_CARE_PROVIDER_SITE_OTHER): Payer: Medicare Other | Admitting: Family Medicine

## 2021-11-02 ENCOUNTER — Encounter: Payer: Self-pay | Admitting: Family Medicine

## 2021-11-02 VITALS — BP 117/75 | HR 80 | Temp 97.5°F | Ht 68.0 in | Wt 294.0 lb

## 2021-11-02 DIAGNOSIS — B372 Candidiasis of skin and nail: Secondary | ICD-10-CM

## 2021-11-02 DIAGNOSIS — L03116 Cellulitis of left lower limb: Secondary | ICD-10-CM

## 2021-11-02 DIAGNOSIS — M79605 Pain in left leg: Secondary | ICD-10-CM | POA: Diagnosis not present

## 2021-11-02 MED ORDER — NYSTATIN 100000 UNIT/GM EX POWD: 1.0000 "application " | Freq: Two times a day (BID) | CUTANEOUS | 0 refills | Status: DC

## 2021-11-02 MED ORDER — CEPHALEXIN 500 MG PO CAPS: 500.0000 mg | ORAL_CAPSULE | Freq: Two times a day (BID) | ORAL | 0 refills | Status: AC

## 2021-11-02 NOTE — Progress Notes (Signed)
? ?Assessment & Plan:  ?1. Left leg pain ?- DG Tibia/Fibula Left ? ?2. Cellulitis of left lower extremity ?Education provided on cellulitis. ?- cephALEXin (KEFLEX) 500 MG capsule; Take 1 capsule (500 mg total) by mouth 2 (two) times daily for 7 days.  Dispense: 14 capsule; Refill: 0 ? ?3. Candidal intertrigo ?- nystatin (MYCOSTATIN/NYSTOP) powder; Apply 1 application. topically 2 (two) times daily.  Dispense: 60 g; Refill: 0 ? ? ?Follow up plan: Return if symptoms worsen or fail to improve. ? ?Hendricks Limes, MSN, APRN, FNP-C ?[facility] ? ?Subjective:  ? ?Patient ID: Ryan Garner, male    DOB: 05-05-1946, 76 y.o.   MRN: [id number] ? ?HPI: ?Ryan Garner is a 76 y.o. male presenting on 11/02/2021 for Knee Pain (Left knee and lower leg pain after hitting it x 2 weeks ago and got worse yesterday) ? ?Patient reports he fell two weeks ago hitting the lower part of his left leg. He is concerned that it is now swollen, red, and draining clear fluid. Yesterday the pain in his leg got worse when he fell again hitting his leg just below the knee. He started using a cane today due to the pain. ? ?He also has a rash in his groin folds he would like assessed. ? ? ?ROS: Negative unless specifically indicated above in HPI.  ? ?Relevant past medical history reviewed and updated as indicated.  ? ?Allergies and medications reviewed and updated. ? ? ?Current Outpatient Medications:  ?  albuterol (VENTOLIN HFA) 108 (90 Base) MCG/ACT inhaler, Inhale 2 puffs into the lungs every 6 (six) hours as needed for wheezing or shortness of breath., Disp: 8 g, Rfl: 2 ?  apixaban (ELIQUIS) 5 MG TABS tablet, Take 1 tablet (5 mg total) by mouth 2 (two) times daily., Disp: 180 tablet, Rfl: 1 ?  atorvastatin (LIPITOR) 80 MG tablet, Take 1 tablet by mouth once daily, Disp: 90 tablet, Rfl: 3 ?  clopidogrel (PLAVIX) 75 MG tablet, Take 1 tablet (75 mg total) by mouth daily with breakfast., Disp: 90 tablet, Rfl: 2 ?  empagliflozin  (JARDIANCE) 10 MG TABS tablet, Take 1 tablet (10 mg total) by mouth daily before breakfast., Disp: 30 tablet, Rfl: 11 ?  fluticasone (FLONASE) 50 MCG/ACT nasal spray, Place 2 sprays into both nostrils daily., Disp: 16 g, Rfl: 6 ?  furosemide (LASIX) 40 MG tablet, Take 2 tablets by mouth once daily, Disp: 180 tablet, Rfl: 3 ?  metolazone (ZAROXOLYN) 5 MG tablet, Take 5 mg by mouth 3 times a week (Monday,Wednesday, Friday), Disp: 30 tablet, Rfl: 3 ?  Misc Natural Products (PROSTATE SUPPORT) 300-15 MG TABS, Take 1 tablet by mouth daily. Super Beta Prostate, Disp: , Rfl:  ?  Multiple Vitamin (MULTIVITAMIN WITH MINERALS) TABS tablet, Take 1 tablet by mouth daily., Disp: , Rfl:  ?  nitroGLYCERIN (NITROSTAT) 0.4 MG SL tablet, Place 1 tablet (0.4 mg total) under the tongue every 5 (five) minutes x 3 doses as needed for chest pain., Disp: 25 tablet, Rfl: 2 ?  potassium chloride SA (KLOR-CON M20) 20 MEQ tablet, Take 40 meq on Monday,Wednesday,Friday, take 20 meq all other days, Disp: 120 tablet, Rfl: 3 ?  Tiotropium Bromide Monohydrate (SPIRIVA RESPIMAT) 1.25 MCG/ACT AERS, Inhale 2 puffs into the lungs daily., Disp: 4 g, Rfl: 0 ?  triamcinolone cream (KENALOG) 0.1 %, Apply 1 application topically 2 (two) times daily., Disp: 30 g, Rfl: 0 ?  metoprolol tartrate (LOPRESSOR) 50 MG tablet, Take 1 tablet (50  mg total) by mouth 2 (two) times daily., Disp: 180 tablet, Rfl: 3 ? ?Allergies  ?Allergen Reactions  ? Flecainide   ?  Per patient dizziness, shortness of breath, blurred vision  ? Rofecoxib Other (See Comments)  ?  (Vioxx)  ? ? ?Objective:  ? ?BP 117/75   Pulse 80   Temp (!) 97.5 ?F (36.4 ?C) (Temporal)   Ht '5\' 8"'$  (1.727 m)   Wt 294 lb (133.4 kg)   BMI 44.70 kg/m?   ? ?Physical Exam ?Vitals reviewed.  ?Constitutional:   ?   General: He is not in acute distress. ?   Appearance: Normal appearance. He is not ill-appearing, toxic-appearing or diaphoretic.  ?HENT:  ?   Head: Normocephalic and atraumatic.  ?Eyes:  ?   General:  No scleral icterus.    ?   Right eye: No discharge.     ?   Left eye: No discharge.  ?   Conjunctiva/sclera: Conjunctivae normal.  ?Cardiovascular:  ?   Rate and Rhythm: Normal rate.  ?Pulmonary:  ?   Effort: Pulmonary effort is normal. No respiratory distress.  ?Musculoskeletal:     ?   General: Normal range of motion.  ?   Cervical back: Normal range of motion.  ?   Right lower leg: 3+ Edema present.  ?   Left lower leg: Swelling (just below knee) and tenderness (just below knee) present. 3+ Edema present.  ?Skin: ?   General: Skin is warm and dry.  ?   Findings: Rash (erythematous between groin folds bilaterally) present.  ?   Comments: Small abrasion to left shin with erythema, warmth, swelling, and drainage of clear fluid.   ?Neurological:  ?   Mental Status: He is alert and oriented to person, place, and time. Mental status is at baseline.  ?Psychiatric:     ?   Mood and Affect: Mood normal.     ?   Behavior: Behavior normal.     ?   Thought Content: Thought content normal.     ?   Judgment: Judgment normal.  ? ? ? ? ? ? ?

## 2021-11-09 ENCOUNTER — Other Ambulatory Visit: Payer: Self-pay | Admitting: Family Medicine

## 2021-11-09 DIAGNOSIS — I4819 Other persistent atrial fibrillation: Secondary | ICD-10-CM

## 2021-11-16 ENCOUNTER — Telehealth: Payer: Medicare Other

## 2021-11-20 ENCOUNTER — Telehealth: Payer: Self-pay | Admitting: Internal Medicine

## 2021-11-20 ENCOUNTER — Other Ambulatory Visit (HOSPITAL_COMMUNITY)
Admission: RE | Admit: 2021-11-20 | Discharge: 2021-11-20 | Disposition: A | Discharge status: Home / Self Care | Payer: Medicare Other | Source: Ambulatory Visit | Attending: Internal Medicine | Admitting: Internal Medicine

## 2021-11-20 DIAGNOSIS — R609 Edema, unspecified: Secondary | ICD-10-CM | POA: Insufficient documentation

## 2021-11-20 DIAGNOSIS — R0602 Shortness of breath: Secondary | ICD-10-CM | POA: Insufficient documentation

## 2021-11-20 DIAGNOSIS — Z79899 Other long term (current) drug therapy: Secondary | ICD-10-CM | POA: Diagnosis not present

## 2021-11-20 LAB — BASIC METABOLIC PANEL
Anion gap: 9 (ref 5–15)
BUN: 32 mg/dL — ABNORMAL HIGH (ref 8–23)
CO2: 26 mmol/L (ref 22–32)
Calcium: 9.4 mg/dL (ref 8.9–10.3)
Chloride: 102 mmol/L (ref 98–111)
Creatinine, Ser: 1.22 mg/dL (ref 0.61–1.24)
GFR, Estimated: >60 mL/min (ref >60)
Glucose, Bld: 141 mg/dL — ABNORMAL HIGH (ref 70–99)
Potassium: 4 mmol/L (ref 3.5–5.1)
Sodium: 137 mmol/L (ref 135–145)

## 2021-11-20 LAB — BRAIN NATRIURETIC PEPTIDE: B Natriuretic Peptide: 485 pg/mL — ABNORMAL HIGH (ref 0.0–100.0)

## 2021-11-20 MED ORDER — METOLAZONE 5 MG PO TABS: ORAL_TABLET | ORAL | 3 refills | Status: DC

## 2021-11-20 MED ORDER — METOPROLOL TARTRATE 50 MG PO TABS: 50.0000 mg | ORAL_TABLET | Freq: Two times a day (BID) | ORAL | 1 refills | Status: DC

## 2021-11-20 NOTE — Telephone Encounter (Signed)
I spoke with pharmacist at [facility]. They state his pcp declined to refill lopressor 25 mg, 1 1/2 tabs bid -which he had written for patient- stating cardiology manages his lopressor 50 mg bid and should be refilled by [place]. ? ?I also refilled Metolazone 5 mg  Monday,Wednesday, Friday-(which was increased on 10/05/21 by Dr.Ross. ? ? ? ? ?

## 2021-11-20 NOTE — Telephone Encounter (Signed)
*  STAT* If patient is at the pharmacy, call can be transferred to refill team. ? ? ?1. Which medications need to be refilled? (please list name of each medication and dose if known)  ?metoprolol tartrate (LOPRESSOR) 50 MG tablet ?or ?metolazone (ZAROXOLYN) 5 MG tablet ?Patient is not sure which one, but says it is a BP medication prescribed by Dr. Harrington Challenger starting with "met" ? ?2. Which pharmacy/location (including street and city if local pharmacy) is medication to be sent to? [place], [place] [address] ? ?3. Do they need a 30 day or 90 day supply? 90 day ? ? ?Patient states he is completely out of medication ?

## 2021-11-21 ENCOUNTER — Telehealth: Payer: Self-pay

## 2021-11-21 NOTE — Telephone Encounter (Signed)
-----   Message from Stephani Police, RN sent at 11/20/2021  5:41 PM EDT ----- ? ?----- Message ----- ?From: Thayer Headings, MD ?Sent: 11/20/2021   5:35 PM EDT ?To: Stephani Police, RN, [facility] Triage ? ?BNP is essentially unchanged.  ?Cont meds ?He needs to watch his intake of salt and salty foods ? ? ?

## 2021-11-21 NOTE — Telephone Encounter (Signed)
Patient notified and verbalized understanding. Pt had no questions or concerns at this time 

## 2021-12-03 ENCOUNTER — Other Ambulatory Visit: Payer: Self-pay | Admitting: Family Medicine

## 2021-12-03 DIAGNOSIS — B372 Candidiasis of skin and nail: Secondary | ICD-10-CM

## 2021-12-15 ENCOUNTER — Encounter: Payer: Self-pay | Admitting: Family Medicine

## 2021-12-15 ENCOUNTER — Ambulatory Visit (INDEPENDENT_AMBULATORY_CARE_PROVIDER_SITE_OTHER): Payer: Medicare Other | Admitting: Family Medicine

## 2021-12-15 VITALS — BP 132/75 | HR 83 | Temp 98.2°F | Ht 68.0 in | Wt 297.2 lb

## 2021-12-15 DIAGNOSIS — E1159 Type 2 diabetes mellitus with other circulatory complications: Secondary | ICD-10-CM | POA: Diagnosis not present

## 2021-12-15 DIAGNOSIS — L84 Corns and callosities: Secondary | ICD-10-CM | POA: Diagnosis not present

## 2021-12-15 DIAGNOSIS — I5042 Chronic combined systolic (congestive) and diastolic (congestive) heart failure: Secondary | ICD-10-CM | POA: Diagnosis not present

## 2021-12-15 DIAGNOSIS — E1169 Type 2 diabetes mellitus with other specified complication: Secondary | ICD-10-CM | POA: Diagnosis not present

## 2021-12-15 DIAGNOSIS — I4819 Other persistent atrial fibrillation: Secondary | ICD-10-CM

## 2021-12-15 DIAGNOSIS — E782 Mixed hyperlipidemia: Secondary | ICD-10-CM | POA: Diagnosis not present

## 2021-12-15 DIAGNOSIS — I10 Essential (primary) hypertension: Secondary | ICD-10-CM

## 2021-12-15 DIAGNOSIS — I2510 Atherosclerotic heart disease of native coronary artery without angina pectoris: Secondary | ICD-10-CM

## 2021-12-15 LAB — BAYER DCA HB A1C WAIVED: HB A1C (BAYER DCA - WAIVED): 7 % — ABNORMAL HIGH (ref 4.8–5.6)

## 2021-12-15 NOTE — Progress Notes (Signed)
BP 132/75   Pulse 83   Temp 98.2 F (36.8 C)   Ht '5\' 8"'$  (1.727 m)   Wt 297 lb 3.2 oz (134.8 kg)   SpO2 95%   BMI 45.19 kg/m    Subjective:   Patient ID: Ryan Garner, male    DOB: [birth year], 76 y.o.   MRN: [id number]  HPI: Ryan Garner is a 76 y.o. male presenting on 12/15/2021 for Diabetes   HPI Type 2 diabetes mellitus Patient comes in today for recheck of his diabetes. Patient has been currently taking Jardiance. Patient is not currently on an ACE inhibitor/ARB. Patient has not seen an ophthalmologist this year. Patient denies any issues with their feet. The symptom started onset as an adult hypertension and hyperlipidemia and CAD and CHF and A-fib ARE RELATED TO DM   Hypertension Patient is currently on furosemide and metolazone and metoprolol, and their blood pressure today is 132/75. Patient denies any lightheadedness or dizziness. Patient denies headaches, blurred vision, chest pains, shortness of breath, or weakness. Denies any side effects from medication and is content with current medication.   Hyperlipidemia Patient is coming in for recheck of his hyperlipidemia. The patient is currently taking atorvastatin. They deny any issues with myalgias or history of liver damage from it. They deny any focal numbness or weakness or chest pain.   A-fib and CHF and CAD Patient sees cardiology for A-fib and CHF and CAD.  He is on metolazone and Plavix and Eliquis and metoprolol help with these.  Cardiology does manage.  He still does get winded sometimes when he comes in and out of fluttering with A-fib.  Relevant past medical, surgical, family and social history reviewed and updated as indicated. Interim medical history since our last visit reviewed. Allergies and medications reviewed and updated.  Review of Systems  Constitutional:  Negative for chills and fever.  Eyes:  Negative for visual disturbance.  Respiratory:  Positive for shortness of breath. Negative for wheezing.    Cardiovascular:  Negative for chest pain and leg swelling.  Musculoskeletal:  Negative for back pain and gait problem.  Skin:  Negative for rash.  Neurological:  Negative for dizziness, weakness and light-headedness.  All other systems reviewed and are negative.  Per HPI unless specifically indicated above   Allergies as of 12/15/2021       Reactions   Flecainide    Per patient dizziness, shortness of breath, blurred vision   Rofecoxib Other (See Comments)   (Vioxx)        Medication List        Accurate as of Dec 15, 2021  1:30 PM. If you have any questions, ask your nurse or doctor.          STOP taking these medications    triamcinolone cream 0.1 % Commonly known as: KENALOG Stopped by: [name] Radiance Deady, MD       TAKE these medications    albuterol 108 (90 Base) MCG/ACT inhaler Commonly known as: VENTOLIN HFA Inhale 2 puffs into the lungs every 6 (six) hours as needed for wheezing or shortness of breath.   apixaban 5 MG Tabs tablet Commonly known as: ELIQUIS Take 1 tablet (5 mg total) by mouth 2 (two) times daily.   atorvastatin 80 MG tablet Commonly known as: LIPITOR Take 1 tablet by mouth once daily   clopidogrel 75 MG tablet Commonly known as: PLAVIX Take 1 tablet (75 mg total) by mouth daily with breakfast.   empagliflozin 10  MG Tabs tablet Commonly known as: Jardiance Take 1 tablet (10 mg total) by mouth daily before breakfast.   fluticasone 50 MCG/ACT nasal spray Commonly known as: FLONASE Place 2 sprays into both nostrils daily.   furosemide 40 MG tablet Commonly known as: LASIX Take 2 tablets by mouth once daily   metolazone 5 MG tablet Commonly known as: ZAROXOLYN Take 5 mg by mouth 3 times a week (Monday,Wednesday, Friday)   metoprolol tartrate 50 MG tablet Commonly known as: LOPRESSOR Take 1 tablet (50 mg total) by mouth 2 (two) times daily.   multivitamin with minerals Tabs tablet Take 1 tablet by mouth daily.    nitroGLYCERIN 0.4 MG SL tablet Commonly known as: NITROSTAT Place 1 tablet (0.4 mg total) under the tongue every 5 (five) minutes x 3 doses as needed for chest pain.   nystatin powder Commonly known as: MYCOSTATIN/NYSTOP APPLY  POWDER TOPICALLY TO AFFECTED AREA TWICE DAILY   potassium chloride SA 20 MEQ tablet Commonly known as: [place]-Con M20 Take 40 meq on Monday,Wednesday,Friday, take 20 meq all other days   Prostate Support 300-15 MG Tabs Take 1 tablet by mouth daily. Super Beta Prostate   Spiriva Respimat 1.25 MCG/ACT Aers Generic drug: Tiotropium Bromide Monohydrate Inhale 2 puffs into the lungs daily.         Objective:   BP 132/75   Pulse 83   Temp 98.2 F (36.8 C)   Ht $R'5\' 8"'Ty$  (1.727 m)   Wt 297 lb 3.2 oz (134.8 kg)   SpO2 95%   BMI 45.19 kg/m   Wt Readings from Last 3 Encounters:  12/15/21 297 lb 3.2 oz (134.8 kg)  11/02/21 294 lb (133.4 kg)  09/18/21 298 lb (135.2 kg)    Physical Exam Vitals and nursing note reviewed.  Constitutional:      General: He is not in acute distress.    Appearance: He is well-developed. He is not diaphoretic.  Eyes:     General: No scleral icterus.    Conjunctiva/sclera: Conjunctivae normal.  Neck:     Thyroid: No thyromegaly.  Cardiovascular:     Rate and Rhythm: Normal rate and regular rhythm.     Heart sounds: Normal heart sounds. No murmur heard. Pulmonary:     Effort: Pulmonary effort is normal. No respiratory distress.     Breath sounds: Normal breath sounds. No wheezing.  Musculoskeletal:        General: No swelling. Normal range of motion.     Cervical back: Neck supple.  Lymphadenopathy:     Cervical: No cervical adenopathy.  Skin:    General: Skin is warm and dry.     Findings: No rash.  Neurological:     Mental Status: He is alert and oriented to person, place, and time.     Coordination: Coordination normal.  Psychiatric:        Behavior: Behavior normal.      Assessment & Plan:   Problem List  Items Addressed This Visit       Cardiovascular and Mediastinum   Hypertension - Primary   Atrial fibrillation ([place])   Coronary artery disease due to type 2 diabetes mellitus (HCC)   CHF (congestive heart failure) (HCC)     Endocrine   Type 2 diabetes mellitus ([place])   Relevant Orders   CBC with Differential/Platelet   CMP14+EGFR   Lipid panel   Bayer DCA Hb A1c Waived     Musculoskeletal and Integument   Foot callus  Other   Hyperlipidemia    Patient still feeling weak from coming in and out of his A-fib, he is working with cardiology still for that.  A1c is 7.0 which is controlled but slightly up from where it has been, focus on diet, no medication change at this point.  Likely from being less active because of the way he is feeling about his A-fib. Follow up plan: Return in about 3 months (around 03/17/2022), or if symptoms worsen or fail to improve, for Diabetes and hypertension and A-fib and CAD and CHF.  Counseling provided for all of the vaccine components Orders Placed This Encounter  Procedures   CBC with Differential/Platelet   CMP14+EGFR   Lipid panel   Bayer DCA Hb A1c [place], MD [facility] 12/15/2021, 1:30 PM

## 2021-12-16 LAB — CBC WITH DIFFERENTIAL/PLATELET
Basophils Absolute: 0 10*3/uL (ref 0.0–0.2)
Basos: 0 %
EOS (ABSOLUTE): 0.1 10*3/uL (ref 0.0–0.4)
Eos: 1 %
Hematocrit: 42.2 % (ref 37.5–51.0)
Hemoglobin: 14.9 g/dL (ref 13.0–17.7)
Immature Grans (Abs): 0 10*3/uL (ref 0.0–0.1)
Immature Granulocytes: 1 %
Lymphocytes Absolute: 1.3 10*3/uL (ref 0.7–3.1)
Lymphs: 16 %
MCH: 31.5 pg (ref 26.6–33.0)
MCHC: 35.3 g/dL (ref 31.5–35.7)
MCV: 89 fL (ref 79–97)
Monocytes Absolute: 0.8 10*3/uL (ref 0.1–0.9)
Monocytes: 10 %
Neutrophils Absolute: 6.1 10*3/uL (ref 1.4–7.0)
Neutrophils: 72 %
Platelets: 226 10*3/uL (ref 150–450)
RBC: 4.73 x10E6/uL (ref 4.14–5.80)
RDW: 12.3 % (ref 11.6–15.4)
WBC: 8.4 10*3/uL (ref 3.4–10.8)

## 2021-12-16 LAB — CMP14+EGFR
ALT: 25 IU/L (ref 0–44)
AST: 21 IU/L (ref 0–40)
Albumin/Globulin Ratio: 1.4 (ref 1.2–2.2)
Albumin: 4 g/dL (ref 3.7–4.7)
Alkaline Phosphatase: 92 IU/L (ref 44–121)
BUN/Creatinine Ratio: 24 (ref 10–24)
BUN: 34 mg/dL — ABNORMAL HIGH (ref 8–27)
Bilirubin Total: 1.3 mg/dL — ABNORMAL HIGH (ref 0.0–1.2)
CO2: 26 mmol/L (ref 20–29)
Calcium: 9.6 mg/dL (ref 8.6–10.2)
Chloride: 94 mmol/L — ABNORMAL LOW (ref 96–106)
Creatinine, Ser: 1.44 mg/dL — ABNORMAL HIGH (ref 0.76–1.27)
Globulin, Total: 2.8 g/dL (ref 1.5–4.5)
Glucose: 136 mg/dL — ABNORMAL HIGH (ref 70–99)
Potassium: 4.1 mmol/L (ref 3.5–5.2)
Sodium: 137 mmol/L (ref 134–144)
Total Protein: 6.8 g/dL (ref 6.0–8.5)
eGFR: 50 mL/min/{1.73_m2} — ABNORMAL LOW (ref >59)

## 2021-12-16 LAB — LIPID PANEL
Chol/HDL Ratio: 2.6 ratio (ref 0.0–5.0)
Cholesterol, Total: 92 mg/dL — ABNORMAL LOW (ref 100–199)
HDL: 35 mg/dL — ABNORMAL LOW (ref >39)
LDL Chol Calc (NIH): 43 mg/dL (ref 0–99)
Triglycerides: 60 mg/dL (ref 0–149)
VLDL Cholesterol Cal: 14 mg/dL (ref 5–40)

## 2022-01-01 ENCOUNTER — Ambulatory Visit (HOSPITAL_COMMUNITY)
Admission: RE | Admit: 2022-01-01 | Discharge: 2022-01-01 | Disposition: A | Discharge status: Home / Self Care | Payer: Medicare Other | Source: Ambulatory Visit | Attending: Family Medicine | Admitting: Family Medicine

## 2022-01-01 DIAGNOSIS — R918 Other nonspecific abnormal finding of lung field: Secondary | ICD-10-CM | POA: Diagnosis not present

## 2022-01-16 ENCOUNTER — Encounter: Payer: Self-pay | Admitting: Emergency Medicine

## 2022-01-16 ENCOUNTER — Ambulatory Visit (INDEPENDENT_AMBULATORY_CARE_PROVIDER_SITE_OTHER): Payer: Medicare Other | Admitting: Emergency Medicine

## 2022-01-16 VITALS — BP 128/74 | HR 75 | Temp 98.2°F | Ht 67.5 in | Wt 295.0 lb

## 2022-01-16 DIAGNOSIS — R918 Other nonspecific abnormal finding of lung field: Secondary | ICD-10-CM | POA: Diagnosis not present

## 2022-01-16 DIAGNOSIS — I2729 Other secondary pulmonary hypertension: Secondary | ICD-10-CM

## 2022-01-16 DIAGNOSIS — J449 Chronic obstructive pulmonary disease, unspecified: Secondary | ICD-10-CM

## 2022-01-16 DIAGNOSIS — I272 Pulmonary hypertension, unspecified: Secondary | ICD-10-CM | POA: Diagnosis not present

## 2022-01-16 NOTE — Assessment & Plan Note (Signed)
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  We will plan to follow in June 2024

## 2022-01-16 NOTE — Assessment & Plan Note (Signed)
I think his dyspnea is in large part due to his PAH as well as deconditioning from obesity.  It was important to get him on his CPAP and he does appear to be tolerating and benefiting.  I think we should recheck echocardiogram in about 6 months to determine whether there is been any interval change in PAH, right heart function.

## 2022-01-25 ENCOUNTER — Ambulatory Visit (INDEPENDENT_AMBULATORY_CARE_PROVIDER_SITE_OTHER): Payer: Medicare Other | Admitting: Physician Assistant

## 2022-01-25 ENCOUNTER — Encounter: Payer: Self-pay | Admitting: Physician Assistant

## 2022-01-25 VITALS — BP 124/83 | HR 82 | Temp 98.8°F | Ht 68.0 in | Wt 293.6 lb

## 2022-01-25 DIAGNOSIS — R499 Unspecified voice and resonance disorder: Secondary | ICD-10-CM

## 2022-01-25 DIAGNOSIS — M79676 Pain in unspecified toe(s): Secondary | ICD-10-CM | POA: Diagnosis not present

## 2022-01-25 NOTE — Progress Notes (Signed)
  Subjective:     Patient ID: Ryan Garner, male   DOB: [birth year], 76 y.o.   MRN: [id number]  Sore Throat  Pertinent negatives include no abdominal pain or trouble swallowing.  Foot Injury    Pt here for 2 reasons #1- progressive hoarseness over the last 4 months Has app[facility] next week Denies any inhaler use Also denies any dyspepsia sx #2- pain to the 2nd toe of the R foot Denies trauma  Review of Systems  Constitutional: Negative.   HENT:  Positive for voice change. Negative for postnasal drip, sinus pressure, sinus pain, sore throat and trouble swallowing.   Gastrointestinal:  Negative for abdominal distention and abdominal pain.       Objective:   Physical Exam Vitals and nursing note reviewed.  Constitutional:      General: He is not in acute distress.    Appearance: He is well-developed. He is obese. He is not ill-appearing or toxic-appearing.  HENT:     Right Ear: Tympanic membrane and ear canal normal.     Left Ear: Tympanic membrane and ear canal normal.     Mouth/Throat:     Mouth: Mucous membranes are moist.     Pharynx: Oropharynx is clear. Uvula midline.     Comments: No tonsils Musculoskeletal:     Cervical back: Neck supple.  Lymphadenopathy:     Cervical: No cervical adenopathy.  Neurological:     Mental Status: He is alert.   Large callous to the tip of the 2nd R toe No surrounding erythema, edema, or induration FROM of the toe No drainage from the site     Assessment:     1. Hoarseness or changing voice   2. Pain of toe, unspecified laterality        Plan:     Warm salt water gargles Honey OTC meds for sx relief Keep appt with Specialist next week Discussed appt with podiatry for toe F/u here prn PATP

## 2022-01-25 NOTE — Patient Instructions (Signed)
Hoarseness  Hoarseness, also called dysphonia, is any abnormal change in your voice that can make it difficult to speak. Your voice may sound raspy, breathy, or strained. Hoarseness is caused by a problem with your vocal cords (vocal folds). These are two bands of tissue inside your voice box (larynx). When you speak, your vocal cords move back and forth to create sound. The surfaces of your vocal cords need to be smooth for your voice to sound clear. Swelling or lumps on your vocal cords can cause hoarseness. Vocal cord problems may be the result of injuries or abnormal growths, certain diseases, upper respiratory infection, or allergies. Other causes may include medicine side effects and exposure to irritants. Follow these instructions at home:  Pay attention to any changes in your symptoms. Take these actions to stay safe and to help relieve your symptoms: Lifestyle Do not eat foods that give you heartburn, such as spicy or acidic foods like hot peppers and orange juice. These foods can cause a gastroesophageal reflux that may worsen your vocal cord problems. Limit how much alcohol and caffeine you drink as told by your health care provider. Drink enough fluid to keep your urine pale yellow. Do not use any products that contain nicotine or tobacco. These products include cigarettes, chewing tobacco, and vaping devices, such as e-cigarettes. If you need help quitting, ask your health care provider. Avoid secondhand smoke. General instructions Use a humidifier if the air in your home is dry. Avoid coughing or clearing your throat. Do not whisper. Whispering can cause muscle strain. Do not speak in a loud or harsh voice. Rest your voice. If recommended by your health care provider, schedule an appointment with a speech-language specialist. This specialist may give you methods to try that can help you avoid misusing your voice. Contact a health care provider if: Your voice is hoarse longer than  2 weeks. You almost lose or completely lose your voice for more than 3 days. You have pain when you swallow or try to talk. You feel a lump in your neck. Get help right away if: You have trouble swallowing. You feel like you are choking when you swallow. You cough up blood or vomit blood. You have trouble breathing. You choke, cannot swallow, or cannot breathe if you lie flat. You notice swelling or a rash on your body, face, or tongue. These symptoms may represent a serious problem that is an emergency. Do not wait to see if the symptoms will go away. Get medical help right away. Call your local emergency services (911 in the [place] Do not drive yourself to the hospital. Summary Hoarseness, also called dysphonia, is any abnormal change in your voice that can make it difficult to speak. Your voice may sound raspy, breathy, or strained. Hoarseness is caused by a problem with your vocal cords (vocal folds). Do not speak in a loud or harsh voice, whisper, use nicotine or tobacco products, or eat foods that give you heartburn. See your health care provider if your hoarseness does not improve after 2 weeks. This information is not intended to replace advice given to you by your health care provider. Make sure you discuss any questions you have with your health care provider. Document Revised: 12/27/2020 Document Reviewed: 12/27/2020 Elsevier Patient Education  [place].

## 2022-01-31 ENCOUNTER — Ambulatory Visit: Payer: Self-pay | Admitting: *Deleted

## 2022-01-31 DIAGNOSIS — E1169 Type 2 diabetes mellitus with other specified complication: Secondary | ICD-10-CM

## 2022-01-31 DIAGNOSIS — R490 Dysphonia: Secondary | ICD-10-CM | POA: Insufficient documentation

## 2022-01-31 DIAGNOSIS — I2510 Atherosclerotic heart disease of native coronary artery without angina pectoris: Secondary | ICD-10-CM

## 2022-01-31 DIAGNOSIS — J3801 Paralysis of vocal cords and larynx, unilateral: Secondary | ICD-10-CM | POA: Diagnosis not present

## 2022-01-31 NOTE — Patient Instructions (Signed)
Ryan Garner  I have enjoyed working with you through the Chronic Care Management Program at [facility]. Due to program changes I am removing myself from your care team because you've either met our goals, your conditions are stable and no longer require care management, or we haven't engaged within the past 6 months. If you are currently active with another CCM Team Member, you will remain active with them unless they reach out to you with additional information. If you feel that you need RN Care Management services in the future, please talk with your primary care provider to discuss re-engagement with the RN Care Manager that will be assigned to [facility]. This does not affect your status as a patient at [facility].   Thank you for allowing me to participate in your your healthcare journey.  Kristen Hudy, BSN, RN-BC Embedded Chronic Care Manager [facility] / [facility] Care Management Direct Dial: [phone]  

## 2022-01-31 NOTE — Chronic Care Management (AMB) (Signed)
  Chronic Care Management   Note  01/31/2022 Name: Ryan Garner MRN: [id number] DOB: 02-19-1946   Patient is stable from [facility] Management perspective or has not recently engaged with the [facility]. I am removing RN Care Manager from Care Team and closing [facility]. If patient is currently engaged with another CCM team member I will forward this encounter to inform them of my case closure. Patient may be eligible for re-engagement with RN Care Manager in the future if necessary and can discuss this with their PCP.  Chong Sicilian, BSN, RN-BC Embedded Chronic Care Manager [facility] / [facility] Management Direct Dial: [phone]

## 2022-02-19 ENCOUNTER — Ambulatory Visit: Payer: Medicare Other | Admitting: Internal Medicine

## 2022-02-27 DIAGNOSIS — I70203 Unspecified atherosclerosis of native arteries of extremities, bilateral legs: Secondary | ICD-10-CM | POA: Diagnosis not present

## 2022-02-27 DIAGNOSIS — B351 Tinea unguium: Secondary | ICD-10-CM | POA: Diagnosis not present

## 2022-02-27 DIAGNOSIS — L84 Corns and callosities: Secondary | ICD-10-CM | POA: Diagnosis not present

## 2022-02-27 DIAGNOSIS — M79676 Pain in unspecified toe(s): Secondary | ICD-10-CM | POA: Diagnosis not present

## 2022-03-01 ENCOUNTER — Encounter (HOSPITAL_COMMUNITY): Payer: Self-pay

## 2022-03-01 ENCOUNTER — Other Ambulatory Visit: Payer: Self-pay

## 2022-03-01 ENCOUNTER — Emergency Department (HOSPITAL_COMMUNITY): Payer: Medicare Other

## 2022-03-01 ENCOUNTER — Inpatient Hospital Stay (HOSPITAL_COMMUNITY)
Admission: EM | Admit: 2022-03-01 | Discharge: 2022-03-05 | DRG: 603 | Disposition: A | Discharge status: Home Health | Payer: Medicare Other | Attending: Family Medicine | Admitting: Family Medicine

## 2022-03-01 DIAGNOSIS — Z833 Family history of diabetes mellitus: Secondary | ICD-10-CM | POA: Diagnosis not present

## 2022-03-01 DIAGNOSIS — L02416 Cutaneous abscess of left lower limb: Principal | ICD-10-CM | POA: Diagnosis present

## 2022-03-01 DIAGNOSIS — J80 Acute respiratory distress syndrome: Secondary | ICD-10-CM | POA: Diagnosis not present

## 2022-03-01 DIAGNOSIS — L02419 Cutaneous abscess of limb, unspecified: Secondary | ICD-10-CM | POA: Diagnosis present

## 2022-03-01 DIAGNOSIS — I272 Pulmonary hypertension, unspecified: Secondary | ICD-10-CM

## 2022-03-01 DIAGNOSIS — I2721 Secondary pulmonary arterial hypertension: Secondary | ICD-10-CM | POA: Diagnosis present

## 2022-03-01 DIAGNOSIS — I10 Essential (primary) hypertension: Secondary | ICD-10-CM | POA: Diagnosis not present

## 2022-03-01 DIAGNOSIS — I2510 Atherosclerotic heart disease of native coronary artery without angina pectoris: Secondary | ICD-10-CM | POA: Diagnosis present

## 2022-03-01 DIAGNOSIS — J9811 Atelectasis: Secondary | ICD-10-CM | POA: Diagnosis not present

## 2022-03-01 DIAGNOSIS — Z8261 Family history of arthritis: Secondary | ICD-10-CM

## 2022-03-01 DIAGNOSIS — Z801 Family history of malignant neoplasm of trachea, bronchus and lung: Secondary | ICD-10-CM | POA: Diagnosis not present

## 2022-03-01 DIAGNOSIS — I482 Chronic atrial fibrillation, unspecified: Secondary | ICD-10-CM | POA: Diagnosis present

## 2022-03-01 DIAGNOSIS — E876 Hypokalemia: Secondary | ICD-10-CM | POA: Diagnosis present

## 2022-03-01 DIAGNOSIS — Z20822 Contact with and (suspected) exposure to covid-19: Secondary | ICD-10-CM | POA: Diagnosis present

## 2022-03-01 DIAGNOSIS — E1122 Type 2 diabetes mellitus with diabetic chronic kidney disease: Secondary | ICD-10-CM | POA: Diagnosis present

## 2022-03-01 DIAGNOSIS — J3800 Paralysis of vocal cords and larynx, unspecified: Secondary | ICD-10-CM | POA: Diagnosis present

## 2022-03-01 DIAGNOSIS — J069 Acute upper respiratory infection, unspecified: Secondary | ICD-10-CM

## 2022-03-01 DIAGNOSIS — R609 Edema, unspecified: Secondary | ICD-10-CM

## 2022-03-01 DIAGNOSIS — I252 Old myocardial infarction: Secondary | ICD-10-CM

## 2022-03-01 DIAGNOSIS — E669 Obesity, unspecified: Secondary | ICD-10-CM | POA: Diagnosis present

## 2022-03-01 DIAGNOSIS — R4702 Dysphasia: Secondary | ICD-10-CM | POA: Diagnosis not present

## 2022-03-01 DIAGNOSIS — G4730 Sleep apnea, unspecified: Secondary | ICD-10-CM | POA: Diagnosis present

## 2022-03-01 DIAGNOSIS — I5032 Chronic diastolic (congestive) heart failure: Secondary | ICD-10-CM | POA: Diagnosis present

## 2022-03-01 DIAGNOSIS — E785 Hyperlipidemia, unspecified: Secondary | ICD-10-CM | POA: Diagnosis present

## 2022-03-01 DIAGNOSIS — R911 Solitary pulmonary nodule: Secondary | ICD-10-CM | POA: Diagnosis not present

## 2022-03-01 DIAGNOSIS — J449 Chronic obstructive pulmonary disease, unspecified: Secondary | ICD-10-CM | POA: Diagnosis present

## 2022-03-01 DIAGNOSIS — I452 Bifascicular block: Secondary | ICD-10-CM | POA: Diagnosis present

## 2022-03-01 DIAGNOSIS — R6510 Systemic inflammatory response syndrome (SIRS) of non-infectious origin without acute organ dysfunction: Secondary | ICD-10-CM | POA: Diagnosis present

## 2022-03-01 DIAGNOSIS — L03116 Cellulitis of left lower limb: Principal | ICD-10-CM | POA: Diagnosis present

## 2022-03-01 DIAGNOSIS — R0902 Hypoxemia: Secondary | ICD-10-CM | POA: Diagnosis not present

## 2022-03-01 DIAGNOSIS — Z7901 Long term (current) use of anticoagulants: Secondary | ICD-10-CM | POA: Diagnosis not present

## 2022-03-01 DIAGNOSIS — N179 Acute kidney failure, unspecified: Secondary | ICD-10-CM | POA: Diagnosis present

## 2022-03-01 DIAGNOSIS — Z955 Presence of coronary angioplasty implant and graft: Secondary | ICD-10-CM

## 2022-03-01 DIAGNOSIS — R600 Localized edema: Secondary | ICD-10-CM | POA: Diagnosis not present

## 2022-03-01 DIAGNOSIS — Z6841 Body Mass Index (BMI) 40.0 and over, adult: Secondary | ICD-10-CM | POA: Diagnosis not present

## 2022-03-01 DIAGNOSIS — I5022 Chronic systolic (congestive) heart failure: Secondary | ICD-10-CM

## 2022-03-01 DIAGNOSIS — Z87891 Personal history of nicotine dependence: Secondary | ICD-10-CM

## 2022-03-01 DIAGNOSIS — Z9049 Acquired absence of other specified parts of digestive tract: Secondary | ICD-10-CM

## 2022-03-01 DIAGNOSIS — N189 Chronic kidney disease, unspecified: Secondary | ICD-10-CM | POA: Diagnosis present

## 2022-03-01 DIAGNOSIS — R062 Wheezing: Secondary | ICD-10-CM

## 2022-03-01 DIAGNOSIS — I130 Hypertensive heart and chronic kidney disease with heart failure and stage 1 through stage 4 chronic kidney disease, or unspecified chronic kidney disease: Secondary | ICD-10-CM | POA: Diagnosis present

## 2022-03-01 DIAGNOSIS — E1169 Type 2 diabetes mellitus with other specified complication: Secondary | ICD-10-CM

## 2022-03-01 DIAGNOSIS — Z8249 Family history of ischemic heart disease and other diseases of the circulatory system: Secondary | ICD-10-CM

## 2022-03-01 DIAGNOSIS — Z7984 Long term (current) use of oral hypoglycemic drugs: Secondary | ICD-10-CM

## 2022-03-01 DIAGNOSIS — I4819 Other persistent atrial fibrillation: Secondary | ICD-10-CM | POA: Diagnosis present

## 2022-03-01 DIAGNOSIS — E119 Type 2 diabetes mellitus without complications: Secondary | ICD-10-CM

## 2022-03-01 DIAGNOSIS — R069 Unspecified abnormalities of breathing: Secondary | ICD-10-CM | POA: Diagnosis not present

## 2022-03-01 DIAGNOSIS — R21 Rash and other nonspecific skin eruption: Secondary | ICD-10-CM | POA: Diagnosis present

## 2022-03-01 DIAGNOSIS — R0689 Other abnormalities of breathing: Secondary | ICD-10-CM | POA: Diagnosis not present

## 2022-03-01 DIAGNOSIS — I4891 Unspecified atrial fibrillation: Secondary | ICD-10-CM | POA: Diagnosis present

## 2022-03-01 DIAGNOSIS — Z888 Allergy status to other drugs, medicaments and biological substances status: Secondary | ICD-10-CM

## 2022-03-01 DIAGNOSIS — J90 Pleural effusion, not elsewhere classified: Secondary | ICD-10-CM | POA: Diagnosis not present

## 2022-03-01 DIAGNOSIS — Z79899 Other long term (current) drug therapy: Secondary | ICD-10-CM

## 2022-03-01 LAB — LACTIC ACID, PLASMA: Lactic Acid, Venous: 1.8 mmol/L (ref 0.5–1.9)

## 2022-03-01 LAB — COMPREHENSIVE METABOLIC PANEL
ALT: 24 U/L (ref 0–44)
AST: 23 U/L (ref 15–41)
Albumin: 3.7 g/dL (ref 3.5–5.0)
Alkaline Phosphatase: 70 U/L (ref 38–126)
Anion gap: 11 (ref 5–15)
BUN: 40 mg/dL — ABNORMAL HIGH (ref 8–23)
CO2: 28 mmol/L (ref 22–32)
Calcium: 9.2 mg/dL (ref 8.9–10.3)
Chloride: 96 mmol/L — ABNORMAL LOW (ref 98–111)
Creatinine, Ser: 1.54 mg/dL — ABNORMAL HIGH (ref 0.61–1.24)
GFR, Estimated: 46 mL/min — ABNORMAL LOW (ref >60)
Glucose, Bld: 136 mg/dL — ABNORMAL HIGH (ref 70–99)
Potassium: 3.2 mmol/L — ABNORMAL LOW (ref 3.5–5.1)
Sodium: 135 mmol/L (ref 135–145)
Total Bilirubin: 1.6 mg/dL — ABNORMAL HIGH (ref 0.3–1.2)
Total Protein: 7 g/dL (ref 6.5–8.1)

## 2022-03-01 LAB — CBC WITH DIFFERENTIAL/PLATELET
Abs Immature Granulocytes: 0.07 10*3/uL (ref 0.00–0.07)
Basophils Absolute: 0 10*3/uL (ref 0.0–0.1)
Basophils Relative: 0 %
Eosinophils Absolute: 0 10*3/uL (ref 0.0–0.5)
Eosinophils Relative: 0 %
HCT: 42.6 % (ref 39.0–52.0)
Hemoglobin: 14.3 g/dL (ref 13.0–17.0)
Immature Granulocytes: 1 %
Lymphocytes Relative: 4 %
Lymphs Abs: 0.6 10*3/uL — ABNORMAL LOW (ref 0.7–4.0)
MCH: 31.2 pg (ref 26.0–34.0)
MCHC: 33.6 g/dL (ref 30.0–36.0)
MCV: 93 fL (ref 80.0–100.0)
Monocytes Absolute: 0.7 10*3/uL (ref 0.1–1.0)
Monocytes Relative: 5 %
Neutro Abs: 11.9 10*3/uL — ABNORMAL HIGH (ref 1.7–7.7)
Neutrophils Relative %: 90 %
Platelets: 213 10*3/uL (ref 150–400)
RBC: 4.58 MIL/uL (ref 4.22–5.81)
RDW: 13 % (ref 11.5–15.5)
WBC: 13.3 10*3/uL — ABNORMAL HIGH (ref 4.0–10.5)
nRBC: 0 % (ref 0.0–0.2)

## 2022-03-01 LAB — RESP PANEL BY RT-PCR (FLU A&B, COVID) ARPGX2
Influenza A by PCR: NEGATIVE
Influenza B by PCR: NEGATIVE
SARS Coronavirus 2 by RT PCR: NEGATIVE

## 2022-03-01 LAB — PROTIME-INR
INR: 1.6 — ABNORMAL HIGH (ref 0.8–1.2)
Prothrombin Time: 19 seconds — ABNORMAL HIGH (ref 11.4–15.2)

## 2022-03-01 LAB — APTT: aPTT: 37 seconds — ABNORMAL HIGH (ref 24–36)

## 2022-03-01 MED ORDER — LACTATED RINGERS IV SOLN: INTRAVENOUS | Status: DC

## 2022-03-01 MED ORDER — SODIUM CHLORIDE 0.9 % IV SOLN
2.0000 g | INTRAVENOUS | Status: DC
  Administered 2022-03-01: 2 g via INTRAVENOUS
  Filled 2022-03-01: qty 20

## 2022-03-01 MED ORDER — POTASSIUM CHLORIDE 10 MEQ/100ML IV SOLN
10.0000 meq | Freq: Once | INTRAVENOUS | Status: AC
  Administered 2022-03-01: 10 meq via INTRAVENOUS
  Filled 2022-03-01: qty 100

## 2022-03-01 MED ORDER — IPRATROPIUM-ALBUTEROL 0.5-2.5 (3) MG/3ML IN SOLN
3.0000 mL | Freq: Once | RESPIRATORY_TRACT | Status: AC
  Administered 2022-03-01: 3 mL via RESPIRATORY_TRACT
  Filled 2022-03-01: qty 3

## 2022-03-01 NOTE — ED Notes (Signed)
Pt taken off non rebreather and placed on 2L

## 2022-03-01 NOTE — Sepsis Progress Note (Signed)
Following per sepsis protocol   

## 2022-03-01 NOTE — ED Provider Notes (Signed)
[facility] EMERGENCY DEPARTMENT Provider Note   CSN: [id number] Arrival date & time: 03/01/22  2141     History  Chief Complaint  Patient presents with   Shortness of Ryan Garner is a 76 y.o. male.   Shortness of Breath  This patient is a 76 year old male on Eliquis for atrial fibrillation, he is on Jardiance for his diabetes and has chronic swelling of his legs for which she takes Zaroxolyn and Lasix.  He is supposed to be taking 80 mg of Lasix a day.  I have reviewed the medical record and the last echocardiogram which she had was in November 2022.  This showed that he did have some right-sided heart strain and hypertrophy there was some stenosis of the aortic valve, the ejection fraction was 60 to 65%, there was severely elevated pulmonary artery systolic pressures.  This was followed up with a CT angiogram in November 2022 which showed that there were some right upper lobe spiculated nodules suspicious for bronchogenic neoplasm, there is no evidence of pulmonary embolism, there was evidence of right heart dilatation from pulmonary artery hypertension.  In December 2022 a PET scan was performed and showed no hypermetabolic activity in the right upper lobe pulmonary nodule suggesting that they were benign.  A CT scan without contrast of the chest was obtained in June 2023 just 2 months ago which showed that there has been no progression of the size of the nodules in the upper lobe.  The patient was started on an antibiotic, Augmentin yesterday for a cellulitis of his left leg.  He has chronic edema of his legs, he had noted that today the redness was spreading he was starting to have shaking chills and fevers and as the day went on he started to become more short of breath.  He called the paramedics because of shortness of breath.  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  He does  not have any reactive lung disease and does not use breathing treatments at home.  The paramedics found the patient to have a respiratory rate of about 30 breaths/min, they gave him oxygen supplemental with a nonrebreather, they felt like his breathing was slightly improved but his lung sounds were diminished.  The patient denies feeling short of breath at this time other than his chronic shortness of breath.  He is more worried about the pain and redness which is spreading in his left leg    Home Medications Prior to Admission medications   Medication Sig Start Date End Date Taking? Authorizing Provider  albuterol (VENTOLIN HFA) 108 (90 Base) MCG/ACT inhaler Inhale 2 puffs into the lungs every 6 (six) hours as needed for wheezing or shortness of breath. 06/20/21   Baruch Gouty, FNP  apixaban (ELIQUIS) 5 MG TABS tablet Take 1 tablet (5 mg total) by mouth 2 (two) times daily. 09/07/21   Fay Records, MD  atorvastatin (LIPITOR) 80 MG tablet Take 1 tablet by mouth once daily 09/18/21   Fay Records, MD  clopidogrel (PLAVIX) 75 MG tablet Take 1 tablet (75 mg total) by mouth daily with breakfast. 05/03/20   Cheryln Manly, NP  empagliflozin (JARDIANCE) 10 MG TABS tablet Take 1 tablet (10 mg total) by mouth daily before breakfast. 12/14/20   Fay Records, MD  fluticasone [facility]) 50 MCG/ACT nasal spray Place 2 sprays into both  nostrils daily. 07/19/16   Eustaquio Maize, MD  furosemide (LASIX) 40 MG tablet Take 2 tablets by mouth once daily 12/07/20   Fay Records, MD  metolazone (ZAROXOLYN) 5 MG tablet Take 5 mg by mouth 3 times a week Laurine Blazer, Friday) 11/20/21   Fay Records, MD  metoprolol tartrate (LOPRESSOR) 50 MG tablet Take 1 tablet (50 mg total) by mouth 2 (two) times daily. 11/20/21 11/15/22  Fay Records, MD  Misc Natural Products (PROSTATE SUPPORT) 300-15 MG TABS Take 1 tablet by mouth daily. Super Beta Prostate    [provider]  Multiple Vitamin (MULTIVITAMIN WITH  MINERALS) TABS tablet Take 1 tablet by mouth daily.    [provider]  nitroGLYCERIN (NITROSTAT) 0.4 MG SL tablet Place 1 tablet (0.4 mg total) under the tongue every 5 (five) minutes x 3 doses as needed for chest pain. 10/25/21   Fay Records, MD  nystatin (MYCOSTATIN/NYSTOP) powder APPLY  POWDER TOPICALLY TO AFFECTED AREA TWICE DAILY 12/04/21   [name], [name], MD  potassium chloride SA (KLOR-CON M20) 20 MEQ tablet Take 40 meq on Monday,Wednesday,Friday, take 20 meq all other days 10/25/21   Fay Records, MD  Tiotropium Bromide Monohydrate (SPIRIVA RESPIMAT) 1.25 MCG/ACT AERS Inhale 2 puffs into the lungs daily. 08/30/21   Collene Gobble, MD      Allergies    Flecainide and Rofecoxib    Review of Systems   Review of Systems  Respiratory:  Positive for shortness of breath.   All other systems reviewed and are negative.   Physical Exam Updated Vital Signs BP 112/68   Pulse 92   Temp 98.3 F (36.8 C) (Oral)   Resp 20   Ht 1.727 m ('5\' 8"'$ )   Wt 133.2 kg   SpO2 100%   BMI 44.65 kg/m  Physical Exam Vitals and nursing note reviewed.  Constitutional:      General: He is not in acute distress.    Appearance: He is well-developed.  HENT:     Head: Normocephalic and atraumatic.     Mouth/Throat:     Pharynx: No oropharyngeal exudate.  Eyes:     General: No scleral icterus.       Right eye: No discharge.        Left eye: No discharge.     Conjunctiva/sclera: Conjunctivae normal.     Pupils: Pupils are equal, round, and reactive to light.  Neck:     Thyroid: No thyromegaly.     Vascular: No JVD.  Cardiovascular:     Rate and Rhythm: Normal rate. Rhythm irregular.     Heart sounds: Murmur heard.     No friction rub. No gallop.  Pulmonary:     Effort: Respiratory distress present.     Breath sounds: Normal breath sounds. No wheezing or rales.     Comments: Breath sounds are clear, the patient speaks in just shortened sentences, he is mildly tachypneic, there is no  wheezing or rales Abdominal:     General: Bowel sounds are normal. There is no distension.     Palpations: Abdomen is soft. There is no mass.     Tenderness: There is no abdominal tenderness.  Musculoskeletal:        General: Swelling and tenderness present. Normal range of motion.     Cervical back: Normal range of motion and neck supple.     Right lower leg: Edema present.     Left lower leg: Edema present.  Comments: Severe bilateral lower extremity edema, right greater than left  Lymphadenopathy:     Cervical: No cervical adenopathy.  Skin:    General: Skin is warm and dry.     Findings: Erythema and rash present.     Comments: Cellulitis which is patchy but confluent in many parts throughout the left lower extremity below and extending just above the knee  Neurological:     General: No focal deficit present.     Mental Status: He is alert.     Coordination: Coordination normal.  Psychiatric:        Behavior: Behavior normal.     ED Results / Procedures / Treatments   Labs (all labs ordered are listed, but only abnormal results are displayed) Labs Reviewed  COMPREHENSIVE METABOLIC PANEL - Abnormal; Notable for the following components:      Result Value   Potassium 3.2 (*)    Chloride 96 (*)    Glucose, Bld 136 (*)    BUN 40 (*)    Creatinine, Ser 1.54 (*)    Total Bilirubin 1.6 (*)    GFR, Estimated 46 (*)    All other components within normal limits  CBC WITH DIFFERENTIAL/PLATELET - Abnormal; Notable for the following components:   WBC 13.3 (*)    Neutro Abs 11.9 (*)    Lymphs Abs 0.6 (*)    All other components within normal limits  PROTIME-INR - Abnormal; Notable for the following components:   Prothrombin Time 19.0 (*)    INR 1.6 (*)    All other components within normal limits  APTT - Abnormal; Notable for the following components:   aPTT 37 (*)    All other components within normal limits  CULTURE, BLOOD (ROUTINE X 2)  CULTURE, BLOOD (ROUTINE X 2)   RESP PANEL BY RT-PCR (FLU A&B, COVID) ARPGX2  URINE CULTURE  LACTIC ACID, PLASMA  LACTIC ACID, PLASMA  URINALYSIS, ROUTINE W REFLEX MICROSCOPIC    EKG EKG Interpretation  Date/Time:  Thursday March 01 2022 21:51:01 EDT Ventricular Rate:  95 PR Interval:    QRS Duration: 120 QT Interval:  406 QTC Calculation: 505 R Axis:   27 Text Interpretation: Atrial fibrillation Ventricular premature complex IRBBB and LPFB Nonspecific T abnormalities, lateral leads Confirmed by Noemi Chapel [phone]) on 03/01/2022 10:24:47 PM  Radiology DG Chest Port 1 View  Result Date: 03/01/2022 CLINICAL DATA:  Questionable sepsis - evaluate for abnormality EXAM: PORTABLE CHEST 1 VIEW COMPARISON:  Radiograph 08/22/2019, CT 01/01/2022 FINDINGS: Cardiomegaly. Stable mediastinal contours. Right pleural effusion is similar to prior. There is associated atelectasis at the right lung base. Evidence of confluent airspace disease. No pulmonary edema. No pneumothorax. Pulmonary nodules on prior CT are not well seen by radiograph. IMPRESSION: 1. Unchanged right pleural effusion and associated atelectasis. 2. Cardiomegaly without pulmonary edema. Electronically Signed   By: Keith Rake M.D.   On: 03/01/2022 22:14    Procedures .Critical Care  Performed by: Noemi Chapel, MD Authorized by: Noemi Chapel, MD   Critical care provider statement:    Critical care time (minutes):  30   Critical care time was exclusive of:  Separately billable procedures and treating other patients and teaching time   Critical care was necessary to treat or prevent imminent or life-threatening deterioration of the following conditions:  Sepsis   Critical care was time spent personally by me on the following activities:  Development of treatment plan with patient or surrogate, discussions with consultants, evaluation of patient's response to  treatment, examination of patient, ordering and review of laboratory studies, ordering and review of  radiographic studies, ordering and performing treatments and interventions, pulse oximetry, re-evaluation of patient's condition, review of old charts and obtaining history from patient or surrogate   I assumed direction of critical care for this patient from another provider in my specialty: no     Care discussed with: admitting provider   Comments:           Medications Ordered in ED Medications  lactated ringers infusion ( Intravenous New Bag/Given 03/01/22 2221)  cefTRIAXone (ROCEPHIN) 2 g in sodium chloride 0.9 % 100 mL IVPB (2 g Intravenous New Bag/Given 03/01/22 2226)  ipratropium-albuterol (DUONEB) 0.5-2.5 (3) MG/3ML nebulizer solution 3 mL (has no administration in time range)  potassium chloride 10 mEq in 100 mL IVPB (has no administration in time range)    ED Course/ Medical Decision Making/ A&P                           Medical Decision Making Amount and/or Complexity of Data Reviewed Labs: ordered. Radiology: ordered. ECG/medicine tests: ordered.  Risk Prescription drug management. Decision regarding hospitalization.   This patient presents to the ED for concern of shortness of breath as well as leg swelling and redness, this involves an extensive number of treatment options, and is a complaint that carries with it a high risk of complications and morbidity.  The differential diagnosis includes cellulitis, I am less convinced that this is more of a shortness of breath issue and think it is more of a cellulitis issue.  His lung sounds are actually very clear, he is already anticoagulated making a pulmonary embolism extremely unlikely.  Additionally he has known pulmonary artery hypertension which may be making this worse, redness and swelling on his leg, chills today, suggest need for inpatient treatment for cellulitis   Co morbidities that complicate the patient evaluation  Obesity, diabetes, hypertension, congestive heart failure, known murmur, from likely aortic  stenosis   Additional history obtained:  Additional history obtained from electronic medical record External records from outside source obtained and reviewed including echocardiogram and cardiology notes   Lab Tests:  I Ordered, and personally interpreted labs.  The pertinent results include: CBC shows a leukocytosis of 13,000, blood cultures are pending, metabolic panel with hypokalemia and a mild acute kidney injury with a slight increasing BUN at 40.  No anemia   Imaging Studies ordered:  I ordered imaging studies including chest x-ray I independently visualized and interpreted imaging which showed right-sided pleural effusion which appears to be small, no other pneumonia, there is cardiomegaly consistent with history of right atrial dilation and pulmonary hypertension I agree with the radiologist interpretation   Cardiac Monitoring: / EKG:  The patient was maintained on a cardiac monitor.  I personally viewed and interpreted the cardiac monitored which showed an underlying rhythm of: Pulse between 90 and 100   Consultations Obtained:  I requested consultation with the hospitalist,  and discussed lab and imaging findings as well as pertinent plan - they recommend: Hospital admission and antibiotics   Problem List / ED Course / Critical interventions / Medication management  Antibiotics including Rocephin given for what appears to be a nonpurulent cellulitis.  Shortness of breath likely related to pulmonary hypertension and vocal cord dysfunction, the patient is not in need of advanced airway management though he does sleep with CPAP and will likely need that this evening. The patient  is considered sepsis with a source of infection being his leg skin and soft tissue with associated pulse between 90 and 100 and white blood cell count of 13, he meets criteria for systemic inflammatory response syndrome with a source I ordered medication including Rocephin for skin and soft tissue  infection Reevaluation of the patient after these medicines showed that the patient stayed the same I have reviewed the patients home medicines and have made adjustments as needed   Social Determinants of Health:  Vocal cord dysfunction, pulmonary hypertension   Test / Admission - Considered:  Will admit to the hospital         Final Clinical Impression(s) / ED Diagnoses Final diagnoses:  Cellulitis of left lower extremity  Peripheral edema  Pulmonary hypertension (HCC)     Noemi Chapel, MD 03/01/22 2248

## 2022-03-01 NOTE — ED Triage Notes (Addendum)
Pt BIB [facility] for SOB. Pt has cellulitis in left leg, currently on amoxicillin, says that "left vocal chord is paralyzed" pt not on O2 at home. Reports sob started today, pt was 90% on room air, EMS placed on oxygen '@15L'$  non-rebreather. Pt has hx of A-fib. Pt has Lasix pill bottle is full- pt endorses taking as prescribed but hasn't taken dose tonight.

## 2022-03-02 DIAGNOSIS — L03116 Cellulitis of left lower limb: Secondary | ICD-10-CM | POA: Diagnosis present

## 2022-03-02 DIAGNOSIS — I130 Hypertensive heart and chronic kidney disease with heart failure and stage 1 through stage 4 chronic kidney disease, or unspecified chronic kidney disease: Secondary | ICD-10-CM | POA: Diagnosis present

## 2022-03-02 DIAGNOSIS — L02419 Cutaneous abscess of limb, unspecified: Secondary | ICD-10-CM | POA: Diagnosis present

## 2022-03-02 DIAGNOSIS — Z833 Family history of diabetes mellitus: Secondary | ICD-10-CM | POA: Diagnosis not present

## 2022-03-02 DIAGNOSIS — E1122 Type 2 diabetes mellitus with diabetic chronic kidney disease: Secondary | ICD-10-CM | POA: Diagnosis present

## 2022-03-02 DIAGNOSIS — Z8261 Family history of arthritis: Secondary | ICD-10-CM | POA: Diagnosis not present

## 2022-03-02 DIAGNOSIS — J449 Chronic obstructive pulmonary disease, unspecified: Secondary | ICD-10-CM | POA: Diagnosis present

## 2022-03-02 DIAGNOSIS — R6510 Systemic inflammatory response syndrome (SIRS) of non-infectious origin without acute organ dysfunction: Secondary | ICD-10-CM | POA: Diagnosis present

## 2022-03-02 DIAGNOSIS — I452 Bifascicular block: Secondary | ICD-10-CM | POA: Diagnosis present

## 2022-03-02 DIAGNOSIS — E785 Hyperlipidemia, unspecified: Secondary | ICD-10-CM | POA: Diagnosis present

## 2022-03-02 DIAGNOSIS — I2721 Secondary pulmonary arterial hypertension: Secondary | ICD-10-CM | POA: Diagnosis present

## 2022-03-02 DIAGNOSIS — Z6841 Body Mass Index (BMI) 40.0 and over, adult: Secondary | ICD-10-CM | POA: Diagnosis not present

## 2022-03-02 DIAGNOSIS — N189 Chronic kidney disease, unspecified: Secondary | ICD-10-CM | POA: Diagnosis present

## 2022-03-02 DIAGNOSIS — I5022 Chronic systolic (congestive) heart failure: Secondary | ICD-10-CM

## 2022-03-02 DIAGNOSIS — I482 Chronic atrial fibrillation, unspecified: Secondary | ICD-10-CM | POA: Diagnosis present

## 2022-03-02 DIAGNOSIS — Z20822 Contact with and (suspected) exposure to covid-19: Secondary | ICD-10-CM | POA: Diagnosis present

## 2022-03-02 DIAGNOSIS — I2510 Atherosclerotic heart disease of native coronary artery without angina pectoris: Secondary | ICD-10-CM | POA: Diagnosis present

## 2022-03-02 DIAGNOSIS — R609 Edema, unspecified: Secondary | ICD-10-CM | POA: Diagnosis present

## 2022-03-02 DIAGNOSIS — L02416 Cutaneous abscess of left lower limb: Secondary | ICD-10-CM | POA: Diagnosis present

## 2022-03-02 DIAGNOSIS — E669 Obesity, unspecified: Secondary | ICD-10-CM | POA: Diagnosis present

## 2022-03-02 DIAGNOSIS — I252 Old myocardial infarction: Secondary | ICD-10-CM | POA: Diagnosis not present

## 2022-03-02 DIAGNOSIS — J3800 Paralysis of vocal cords and larynx, unspecified: Secondary | ICD-10-CM | POA: Diagnosis present

## 2022-03-02 DIAGNOSIS — Z801 Family history of malignant neoplasm of trachea, bronchus and lung: Secondary | ICD-10-CM | POA: Diagnosis not present

## 2022-03-02 DIAGNOSIS — Z8249 Family history of ischemic heart disease and other diseases of the circulatory system: Secondary | ICD-10-CM | POA: Diagnosis not present

## 2022-03-02 DIAGNOSIS — I5032 Chronic diastolic (congestive) heart failure: Secondary | ICD-10-CM | POA: Diagnosis present

## 2022-03-02 DIAGNOSIS — N179 Acute kidney failure, unspecified: Secondary | ICD-10-CM | POA: Diagnosis present

## 2022-03-02 DIAGNOSIS — Z7901 Long term (current) use of anticoagulants: Secondary | ICD-10-CM | POA: Diagnosis not present

## 2022-03-02 LAB — URINALYSIS, ROUTINE W REFLEX MICROSCOPIC
Bilirubin Urine: NEGATIVE
Glucose, UA: NEGATIVE mg/dL
Hgb urine dipstick: NEGATIVE
Ketones, ur: 5 mg/dL — AB
Leukocytes,Ua: NEGATIVE
Nitrite: NEGATIVE
Protein, ur: NEGATIVE mg/dL
Specific Gravity, Urine: 1.012 (ref 1.005–1.030)
pH: 5 (ref 5.0–8.0)

## 2022-03-02 LAB — CBG MONITORING, ED
Glucose-Capillary: 174 mg/dL — ABNORMAL HIGH (ref 70–99)
Glucose-Capillary: 159 mg/dL — ABNORMAL HIGH (ref 70–99)
Glucose-Capillary: 152 mg/dL — ABNORMAL HIGH (ref 70–99)
Glucose-Capillary: 149 mg/dL — ABNORMAL HIGH (ref 70–99)

## 2022-03-02 LAB — GLUCOSE, CAPILLARY: Glucose-Capillary: 178 mg/dL — ABNORMAL HIGH (ref 70–99)

## 2022-03-02 LAB — LACTIC ACID, PLASMA
Lactic Acid, Venous: 1.9 mmol/L (ref 0.5–1.9)
Lactic Acid, Venous: 2.5 mmol/L (ref 0.5–1.9)
Lactic Acid, Venous: 2.7 mmol/L (ref 0.5–1.9)
Lactic Acid, Venous: 2.9 mmol/L (ref 0.5–1.9)

## 2022-03-02 MED ORDER — FLUTICASONE PROPIONATE 50 MCG/ACT NA SUSP
2.0000 | Freq: Every day | NASAL | Status: DC
  Administered 2022-03-02 – 2022-03-03 (×2): 2 via NASAL
  Filled 2022-03-02 (×2): qty 16

## 2022-03-02 MED ORDER — NITROGLYCERIN 0.4 MG SL SUBL: 0.4000 mg | SUBLINGUAL_TABLET | SUBLINGUAL | Status: DC | PRN

## 2022-03-02 MED ORDER — EMPAGLIFLOZIN 10 MG PO TABS
10.0000 mg | ORAL_TABLET | Freq: Every day | ORAL | Status: DC
  Administered 2022-03-03 – 2022-03-05 (×3): 10 mg via ORAL
  Filled 2022-03-02 (×7): qty 1

## 2022-03-02 MED ORDER — ALBUTEROL SULFATE HFA 108 (90 BASE) MCG/ACT IN AERS: 2.0000 | INHALATION_SPRAY | Freq: Four times a day (QID) | RESPIRATORY_TRACT | Status: DC | PRN

## 2022-03-02 MED ORDER — FUROSEMIDE 40 MG PO TABS
80.0000 mg | ORAL_TABLET | Freq: Every day | ORAL | Status: DC
  Administered 2022-03-02 – 2022-03-05 (×4): 80 mg via ORAL
  Filled 2022-03-02 (×4): qty 2

## 2022-03-02 MED ORDER — METOPROLOL TARTRATE 50 MG PO TABS
50.0000 mg | ORAL_TABLET | Freq: Two times a day (BID) | ORAL | Status: DC
  Administered 2022-03-02 – 2022-03-05 (×8): 50 mg via ORAL
  Filled 2022-03-02 (×8): qty 1

## 2022-03-02 MED ORDER — SODIUM CHLORIDE 0.9 % IV SOLN: 250.0000 mL | INTRAVENOUS | Status: DC | PRN

## 2022-03-02 MED ORDER — INSULIN ASPART 100 UNIT/ML IJ SOLN
0.0000 [IU] | Freq: Three times a day (TID) | INTRAMUSCULAR | Status: DC
  Administered 2022-03-02 (×2): 4 [IU] via SUBCUTANEOUS
  Administered 2022-03-02: 3 [IU] via SUBCUTANEOUS
  Administered 2022-03-03 (×2): 4 [IU] via SUBCUTANEOUS
  Administered 2022-03-03: 3 [IU] via SUBCUTANEOUS
  Administered 2022-03-04: 7 [IU] via SUBCUTANEOUS
  Filled 2022-03-02 (×3): qty 1

## 2022-03-02 MED ORDER — SODIUM CHLORIDE 0.9% FLUSH: 3.0000 mL | INTRAVENOUS | Status: DC | PRN

## 2022-03-02 MED ORDER — POTASSIUM CHLORIDE CRYS ER 20 MEQ PO TBCR
40.0000 meq | EXTENDED_RELEASE_TABLET | Freq: Once | ORAL | Status: AC
  Administered 2022-03-02: 40 meq via ORAL
  Filled 2022-03-02: qty 2

## 2022-03-02 MED ORDER — APIXABAN 5 MG PO TABS
5.0000 mg | ORAL_TABLET | Freq: Two times a day (BID) | ORAL | Status: DC
  Administered 2022-03-02 – 2022-03-05 (×8): 5 mg via ORAL
  Filled 2022-03-02 (×8): qty 1

## 2022-03-02 MED ORDER — SODIUM CHLORIDE 0.9 % IV SOLN: 2.0000 g | INTRAVENOUS | Status: DC

## 2022-03-02 MED ORDER — ACETAMINOPHEN 325 MG PO TABS
650.0000 mg | ORAL_TABLET | Freq: Four times a day (QID) | ORAL | Status: DC | PRN
  Administered 2022-03-02: 650 mg via ORAL
  Filled 2022-03-02: qty 2

## 2022-03-02 MED ORDER — DOXYCYCLINE HYCLATE 100 MG PO TABS
100.0000 mg | ORAL_TABLET | Freq: Two times a day (BID) | ORAL | Status: DC
  Administered 2022-03-02 – 2022-03-05 (×6): 100 mg via ORAL
  Filled 2022-03-02 (×6): qty 1

## 2022-03-02 MED ORDER — ACETAMINOPHEN 650 MG RE SUPP: 650.0000 mg | Freq: Four times a day (QID) | RECTAL | Status: DC | PRN

## 2022-03-02 MED ORDER — SENNA 8.6 MG PO TABS
1.0000 | ORAL_TABLET | Freq: Two times a day (BID) | ORAL | Status: DC
  Administered 2022-03-02 – 2022-03-03 (×2): 8.6 mg via ORAL
  Filled 2022-03-02 (×4): qty 1

## 2022-03-02 MED ORDER — SODIUM CHLORIDE 0.9 % IV SOLN: INTRAVENOUS | Status: DC

## 2022-03-02 MED ORDER — ATORVASTATIN CALCIUM 40 MG PO TABS
80.0000 mg | ORAL_TABLET | Freq: Every day | ORAL | Status: DC
  Administered 2022-03-02 – 2022-03-05 (×4): 80 mg via ORAL
  Filled 2022-03-02 (×4): qty 2

## 2022-03-02 MED ORDER — POTASSIUM CHLORIDE CRYS ER 20 MEQ PO TBCR
40.0000 meq | EXTENDED_RELEASE_TABLET | ORAL | Status: DC
  Administered 2022-03-05: 40 meq via ORAL
  Filled 2022-03-02 (×2): qty 2

## 2022-03-02 MED ORDER — SODIUM CHLORIDE 0.9% FLUSH
3.0000 mL | Freq: Two times a day (BID) | INTRAVENOUS | Status: DC
  Administered 2022-03-02 – 2022-03-03 (×3): 3 mL via INTRAVENOUS

## 2022-03-02 MED ORDER — SODIUM CHLORIDE 0.9 % IV SOLN
2.0000 g | INTRAVENOUS | Status: DC
  Administered 2022-03-02 – 2022-03-04 (×3): 2 g via INTRAVENOUS
  Filled 2022-03-02 (×3): qty 20

## 2022-03-02 MED ORDER — ALBUTEROL SULFATE (2.5 MG/3ML) 0.083% IN NEBU
2.5000 mg | INHALATION_SOLUTION | Freq: Four times a day (QID) | RESPIRATORY_TRACT | Status: DC | PRN
Start: 2022-03-02 — End: 2022-03-05

## 2022-03-02 MED ORDER — UMECLIDINIUM BROMIDE 62.5 MCG/ACT IN AEPB
1.0000 | INHALATION_SPRAY | Freq: Every day | RESPIRATORY_TRACT | Status: DC
  Administered 2022-03-02 – 2022-03-05 (×4): 1 via RESPIRATORY_TRACT
  Filled 2022-03-02: qty 7

## 2022-03-02 MED ORDER — CLOPIDOGREL BISULFATE 75 MG PO TABS
75.0000 mg | ORAL_TABLET | Freq: Every day | ORAL | Status: DC
  Administered 2022-03-02 – 2022-03-05 (×4): 75 mg via ORAL
  Filled 2022-03-02 (×4): qty 1

## 2022-03-02 MED ORDER — TIOTROPIUM BROMIDE MONOHYDRATE 1.25 MCG/ACT IN AERS: 2.0000 | INHALATION_SPRAY | Freq: Every day | RESPIRATORY_TRACT | Status: DC

## 2022-03-02 MED ORDER — METOLAZONE 5 MG PO TABS
5.0000 mg | ORAL_TABLET | ORAL | Status: DC
  Administered 2022-03-02 – 2022-03-05 (×2): 5 mg via ORAL
  Filled 2022-03-02: qty 1
  Filled 2022-03-02: qty 2
  Filled 2022-03-02: qty 1

## 2022-03-02 NOTE — Assessment & Plan Note (Signed)
Informed patient that on Jardiance his lst A1C 12/15/21 was 7%, indicating good control but establishes a definite diagnosis.  Plan Continue Jardiance  Low carb diet  Ss coverage

## 2022-03-02 NOTE — Subjective & Objective (Addendum)
Ryan Garner is a 76 y/o followed for HFpEF with intermittent SOB, CAD w/ h/o STEMI s/p PCI Stenting of single vessel, rate controlled A. Fib on Eliquis, HTN, HLD, pulm HTN, DM2, obesity and prior h/o cellulitis left LE. He has recurrent cellulitis Left LE and was recently started on Augmentin however he has increased erythema and rapid spread up his leg. He presents to [facility] for further evaluation of his infected leg.

## 2022-03-02 NOTE — Assessment & Plan Note (Signed)
Patient with chronic A fib followed by Dr. Harrington Challenger and Dr.Taylor. He is rate controlled and on Eliquis  Plan Continue home regimen

## 2022-03-02 NOTE — Assessment & Plan Note (Signed)
Last echo 06/02/21 with EF 60-65%. Diastolic functin indeterminate. Reviewed Dr. Lizbeth Bark' note which confirms diagnosis of HFpEF. Patient w/o signs of decompensation.  Plan Continue home regimen

## 2022-03-02 NOTE — Progress Notes (Signed)
Patient seen and evaluated, chart reviewed, please see EMR for updated orders. Please see full H&P dictated by admitting physician Dr. Linda Hedges for same date of service.    Brief Summary:- 76 y/o followed for HFpEF with intermittent SOB, CAD w/ h/o STEMI s/p PCI Stenting of single vessel, rate controlled A. Fib on Eliquis, HTN, HLD, pulm HTN, DM2, obesity and prior h/o cellulitis left LE admitted on 03/02/2022 with recurrent left lower extremity cellulitis patient failed outpatient Augmentin prior to admission  A/p 1) recurrent left lower extremity cellulitis--failed outpatient Augmentin -Currently on IV Rocephin -We will add doxycycline for MRSA coverage  2)DM2-continue Jardiance Use Novolog/Humalog Sliding scale insulin with Accu-Cheks/Fingersticks as ordered   3)PAFib--- continue metoprolol for rate control and Eliquis for stroke prophylaxis  4)HFpEF----chronic diastolic dysfunction CHF -Appears compensated at this time -Continue Lasix and metolazone  5)COPD--stable, no acute exacerbation, continue bronchodilators  Total care time over 53 minutes  Roxan Hockey, MD

## 2022-03-02 NOTE — Assessment & Plan Note (Signed)
Patient is diabetic. He has had previous episodes LE cellulitis including episode that required wound care intervention. Now with rapidly progressive cellulitis left LE with leukocytosis.  Plan Med-surg obs admission  Rocephin 2g IV q 24

## 2022-03-02 NOTE — Assessment & Plan Note (Signed)
BP stable.  Plan Continue home regimen 

## 2022-03-02 NOTE — Assessment & Plan Note (Signed)
-   Continue home meds °

## 2022-03-02 NOTE — H&P (Signed)
History and Physical    Ryan Garner EOF:[id number] DOB: Aug 24, 1945 DOA: 03/01/2022  DOS: the patient was seen and examined on 03/01/2022  PCP: [name], [name], MD   Patient coming from: Home  I have personally briefly reviewed patient's old medical records in [place]  Ryan Garner is a 76 y/o followed for HFpEF with intermittent SOB, CAD w/ h/o STEMI s/p PCI Stenting of single vessel, rate controlled A. Fib on Eliquis, HTN, HLD, pulm HTN, DM2, obesity and prior h/o cellulitis left LE. He has recurrent cellulitis Left LE and was recently started on Augmentin however he has increased erythema and rapid spread up his leg. He presents to [facility] for further evaluation of his infected leg.    ED Course: Afebrile BP 104/66  HR 90 BMI 44.6 Patient was c/o SOB. EDP initiated code sepsis - no fluid bolus given, rapid administration of IV Rocephin done. Patient was stabilized. Lab revealed K 3.2, Cr 1.54 (baseline 1.22) BNP 485 - at his baseline. WBC 13.3 with 90/4/5, blood Cx's pending. CXR w/ chronic effusion no acute disease. TRH called to admit patient for continued mgt.   Review of Systems:  Review of Systems  Constitutional:  Negative for chills, fever and weight loss.  HENT: Negative.    Eyes: Negative.   Respiratory:  Positive for shortness of breath.   Cardiovascular:  Positive for leg swelling. Negative for chest pain and palpitations.  Gastrointestinal: Negative.   Genitourinary: Negative.   Musculoskeletal: Negative.   Skin:        Distal left leg "on fire" and red  Neurological: Negative.   Endo/Heme/Allergies: Negative.   Psychiatric/Behavioral: Negative.      Past Medical History:  Diagnosis Date   A-fib [facility])    Acute on chronic diastolic CHF (congestive heart failure) ([place]) 07/21/2020   Hyperlipidemia    Hypertension    Metabolic syndrome    Prediabetes    STEMI (ST elevation myocardial infarction) [facility])     Past Surgical History:  Procedure Laterality  Date   APPENDECTOMY     CHOLECYSTECTOMY N/A 03/02/2020   Procedure: LAPAROSCOPIC CHOLECYSTECTOMY;  Surgeon: Aviva Signs, MD;  Location: [facility];  Service: General;  Laterality: N/A;   CORONARY STENT INTERVENTION N/A 04/30/2020   Procedure: CORONARY STENT INTERVENTION;  Surgeon: Martinique, Peter M, MD;  Location: [place] [facility];  Service: Cardiovascular;  Laterality: N/A;   CORONARY/GRAFT ACUTE MI REVASCULARIZATION N/A 04/30/2020   Procedure: Coronary/Graft Acute MI Revascularization;  Surgeon: Martinique, Peter M, MD;  Location: [place] [facility];  Service: Cardiovascular;  Laterality: N/A;   Eyelid Surgery Bilateral    HERNIA REPAIR     knee tendons repair     LEFT HEART CATH AND CORONARY ANGIOGRAPHY N/A 04/30/2020   Procedure: LEFT HEART CATH AND CORONARY ANGIOGRAPHY;  Surgeon: Martinique, Peter M, MD;  Location: [place] [facility];  Service: Cardiovascular;  Laterality: N/A;   ROTATOR CUFF REPAIR Bilateral    TONSILLECTOMY      Soc Hx - married 60 years. I daughter - died 2/2 covid, 1 son. 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 LIves with wife who is disabled after a series of strokes.    reports that he quit smoking about 62 years ago. His smoking use included cigarettes. He has never used smokeless tobacco. He reports that he does not drink alcohol and does not use drugs.  Allergies  Allergen Reactions   Flecainide     Per  patient dizziness, shortness of breath, blurred vision   Rofecoxib Other (See Comments)    (Vioxx)    Family History  Problem Relation Age of Onset   Cancer Mother        lungs   Coronary artery disease Mother    Diabetes Brother    Heart attack Brother    Diabetes Maternal Grandmother    Arthritis Father     Prior to Admission medications   Medication Sig Start Date End Date Taking? Authorizing Provider  albuterol (VENTOLIN HFA) 108 (90 Base) MCG/ACT inhaler Inhale 2 puffs into the lungs every 6 (six) hours as needed for  wheezing or shortness of breath. 06/20/21   Baruch Gouty, FNP  apixaban (ELIQUIS) 5 MG TABS tablet Take 1 tablet (5 mg total) by mouth 2 (two) times daily. 09/07/21   Fay Records, MD  atorvastatin (LIPITOR) 80 MG tablet Take 1 tablet by mouth once daily 09/18/21   Fay Records, MD  clopidogrel (PLAVIX) 75 MG tablet Take 1 tablet (75 mg total) by mouth daily with breakfast. 05/03/20   Cheryln Manly, NP  empagliflozin (JARDIANCE) 10 MG TABS tablet Take 1 tablet (10 mg total) by mouth daily before breakfast. 12/14/20   Fay Records, MD  fluticasone [facility]) 50 MCG/ACT nasal spray Place 2 sprays into both nostrils daily. 07/19/16   Eustaquio Maize, MD  furosemide (LASIX) 40 MG tablet Take 2 tablets by mouth once daily 12/07/20   Fay Records, MD  metolazone (ZAROXOLYN) 5 MG tablet Take 5 mg by mouth 3 times a week Laurine Blazer, Friday) 11/20/21   Fay Records, MD  metoprolol tartrate (LOPRESSOR) 50 MG tablet Take 1 tablet (50 mg total) by mouth 2 (two) times daily. 11/20/21 11/15/22  Fay Records, MD  Misc Natural Products (PROSTATE SUPPORT) 300-15 MG TABS Take 1 tablet by mouth daily. Super Beta Prostate    [provider]  Multiple Vitamin (MULTIVITAMIN WITH MINERALS) TABS tablet Take 1 tablet by mouth daily.    [provider]  nitroGLYCERIN (NITROSTAT) 0.4 MG SL tablet Place 1 tablet (0.4 mg total) under the tongue every 5 (five) minutes x 3 doses as needed for chest pain. 10/25/21   Fay Records, MD  nystatin (MYCOSTATIN/NYSTOP) powder APPLY  POWDER TOPICALLY TO AFFECTED AREA TWICE DAILY 12/04/21   [name], [name], MD  potassium chloride SA (KLOR-CON M20) 20 MEQ tablet Take 40 meq on Monday,Wednesday,Friday, take 20 meq all other days 10/25/21   Fay Records, MD  Tiotropium Bromide Monohydrate (SPIRIVA RESPIMAT) 1.25 MCG/ACT AERS Inhale 2 puffs into the lungs daily. 08/30/21   Collene Gobble, MD    Physical Exam: Vitals:   03/01/22 2154 03/01/22 2300 03/01/22 2313  03/02/22 0004  BP: 112/68 121/81  104/66  Pulse: 92 94  90  Resp: 20 (!) 23  20  Temp: 98.3 F (36.8 C)   98.4 F (36.9 C)  TempSrc: Oral     SpO2: 100% 100% 100% 99%  Weight:      Height:        Physical Exam Vitals and nursing note reviewed.  Constitutional:      General: He is not in acute distress.    Appearance: He is obese. He is ill-appearing.  HENT:     Head: Normocephalic and atraumatic.  Eyes:     Extraocular Movements: Extraocular movements intact.     Pupils: Pupils are equal, round, and reactive to light.  Neck:  Comments: Obese neck hinders exam Cardiovascular:     Rate and Rhythm: Normal rate. Rhythm irregular.  Pulmonary:     Effort: Pulmonary effort is normal. No accessory muscle usage or respiratory distress.     Breath sounds: Examination of the right-lower field reveals decreased breath sounds. Decreased breath sounds present. No wheezing, rhonchi or rales.  Abdominal:     General: Bowel sounds are normal.     Palpations: Abdomen is soft.  Musculoskeletal:     Cervical back: Normal range of motion.     Right lower leg: No edema (4+ pedal and LE edema bilateral to knees).     Left lower leg: Tenderness present.  Skin:    General: Skin is warm and dry.     Findings: Erythema (bright erythema distal LE left with rising mild erythema to4 cm below the knee) present.  Neurological:     General: No focal deficit present.     Mental Status: He is alert and oriented to person, place, and time.  Psychiatric:        Mood and Affect: Mood normal.        Behavior: Behavior normal.      Labs on Admission: I have personally reviewed following labs and imaging studies  CBC: Recent Labs  Lab 03/01/22 2200  WBC 13.3*  NEUTROABS 11.9*  HGB 14.3  HCT 42.6  MCV 93.0  PLT 213   Basic Metabolic Panel: Recent Labs  Lab 03/01/22 2200  NA 135  K 3.2*  CL 96*  CO2 28  GLUCOSE 136*  BUN 40*  CREATININE 1.54*  CALCIUM 9.2   GFR: Estimated  Creatinine Clearance: 52.7 mL/min (A) (by C-G formula based on SCr of 1.54 mg/dL (H)). Liver Function Tests: Recent Labs  Lab 03/01/22 2200  AST 23  ALT 24  ALKPHOS 70  BILITOT 1.6*  PROT 7.0  ALBUMIN 3.7   No results for input(s): "LIPASE", "AMYLASE" in the last 168 hours. No results for input(s): "AMMONIA" in the last 168 hours. Coagulation Profile: Recent Labs  Lab 03/01/22 2200  INR 1.6*   Cardiac Enzymes: No results for input(s): "CKTOTAL", "CKMB", "CKMBINDEX", "TROPONINI" in the last 168 hours. BNP (last 3 results) No results for input(s): "PROBNP" in the last 8760 hours. HbA1C: No results for input(s): "HGBA1C" in the last 72 hours. CBG: No results for input(s): "GLUCAP" in the last 168 hours. Lipid Profile: No results for input(s): "CHOL", "HDL", "LDLCALC", "TRIG", "CHOLHDL", "LDLDIRECT" in the last 72 hours. Thyroid Function Tests: No results for input(s): "TSH", "T4TOTAL", "FREET4", "T3FREE", "THYROIDAB" in the last 72 hours. Anemia Panel: No results for input(s): "VITAMINB12", "FOLATE", "FERRITIN", "TIBC", "IRON", "RETICCTPCT" in the last 72 hours. Urine analysis:    Component Value Date/Time   COLORURINE YELLOW 02/28/2020 1440   APPEARANCEUR CLOUDY (A) 02/28/2020 1440   LABSPEC 1.019 02/28/2020 1440   PHURINE 5.0 02/28/2020 1440   GLUCOSEU NEGATIVE 02/28/2020 1440   HGBUR NEGATIVE 02/28/2020 1440   BILIRUBINUR NEGATIVE 02/28/2020 1440   BILIRUBINUR neg 08/27/2014 0841   KETONESUR NEGATIVE 02/28/2020 1440   PROTEINUR NEGATIVE 02/28/2020 1440   UROBILINOGEN negative 08/27/2014 0841   NITRITE NEGATIVE 02/28/2020 1440   LEUKOCYTESUR NEGATIVE 02/28/2020 1440    Radiological Exams on Admission: I have personally reviewed images DG Chest Port 1 View  Result Date: 03/01/2022 CLINICAL DATA:  Questionable sepsis - evaluate for abnormality EXAM: PORTABLE CHEST 1 VIEW COMPARISON:  Radiograph 08/22/2019, CT 01/01/2022 FINDINGS: Cardiomegaly. Stable mediastinal  contours. Right pleural effusion is   similar to prior. There is associated atelectasis at the right lung base. Evidence of confluent airspace disease. No pulmonary edema. No pneumothorax. Pulmonary nodules on prior CT are not well seen by radiograph. IMPRESSION: 1. Unchanged right pleural effusion and associated atelectasis. 2. Cardiomegaly without pulmonary edema. Electronically Signed   By: Keith Rake M.D.   On: 03/01/2022 22:14    EKG: I have personally reviewed EKG: A. Fib, I-RBBB, LPFB, no STEMI  Assessment/Plan Principal Problem:   Cellulitis of left leg Active Problems:   Cellulitis of left lower extremity   Type 2 diabetes mellitus (HCC)   Atrial fibrillation (HCC)   Chronic diastolic CHF (congestive heart failure) ([place])   Hypertension   Hyperlipidemia    Assessment and Plan: Cellulitis of left lower extremity Patient is diabetic. He has had previous episodes LE cellulitis including episode that required wound care intervention. Now with rapidly progressive cellulitis left LE with leukocytosis.  Plan Med-surg obs admission  Rocephin 2g IV q 24  Chronic diastolic CHF (congestive heart failure) ([place]) Last echo 06/02/21 with EF 60-65%. Diastolic functin indeterminate. Reviewed Dr. Lizbeth Bark' note which confirms diagnosis of HFpEF. Patient w/o signs of decompensation.  Plan Continue home regimen  Atrial fibrillation [facility]) Patient with chronic A fib followed by Dr. Harrington Challenger and Dr.Taylor. He is rate controlled and on Eliquis  Plan Continue home regimen  Type 2 diabetes mellitus ([place]) Informed patient that on Jardiance his lst A1C 12/15/21 was 7%, indicating good control but establishes a definite diagnosis.  Plan Continue Jardiance  Low carb diet  Ss coverage  Hyperlipidemia Continue home meds  Hypertension BP stable  Plan Continue home regimen       DVT prophylaxis: Eliquis Code Status: Full Code Family Communication: did not call spouse due to hour and to her  h/o CVA  Disposition Plan: home when stable  Consults called: none  Admission status: Observation, Med-Surg   Adella Hare, MD [facility] 03/02/2022, 1:14 AM

## 2022-03-02 NOTE — Progress Notes (Signed)
  Transition of Care [facility]) Screening Note   Patient Details  Name: Ryan Garner Date of Birth: [birth year]   Transition of Care [facility]) CM/SW Contact:    Iona Beard, [place] Phone Number: 03/02/2022, 11:11 AM    Transition of Care Department [facility]) has reviewed patient and no TOC needs have been identified at this time. We will continue to monitor patient advancement through interdisciplinary progression rounds. If new patient transition needs arise, please place a TOC consult.

## 2022-03-02 NOTE — Plan of Care (Signed)
  Problem: Coping: Goal: Ability to adjust to condition or change in health will improve Outcome: Progressing   Problem: Fluid Volume: Goal: Ability to maintain a balanced intake and output will improve Outcome: Progressing   Problem: Pain Managment: Goal: General experience of comfort will improve Outcome: Progressing   Problem: Safety: Goal: Ability to remain free from injury will improve Outcome: Progressing   Problem: Skin Integrity: Goal: Risk for impaired skin integrity will decrease Outcome: Progressing

## 2022-03-03 DIAGNOSIS — L03116 Cellulitis of left lower limb: Secondary | ICD-10-CM | POA: Diagnosis not present

## 2022-03-03 LAB — BASIC METABOLIC PANEL
Anion gap: 9 (ref 5–15)
BUN: 31 mg/dL — ABNORMAL HIGH (ref 8–23)
CO2: 27 mmol/L (ref 22–32)
Calcium: 8.4 mg/dL — ABNORMAL LOW (ref 8.9–10.3)
Chloride: 99 mmol/L (ref 98–111)
Creatinine, Ser: 1.2 mg/dL (ref 0.61–1.24)
GFR, Estimated: >60 mL/min (ref >60)
Glucose, Bld: 115 mg/dL — ABNORMAL HIGH (ref 70–99)
Potassium: 2.9 mmol/L — ABNORMAL LOW (ref 3.5–5.1)
Sodium: 135 mmol/L (ref 135–145)

## 2022-03-03 LAB — CBC
HCT: 38.5 % — ABNORMAL LOW (ref 39.0–52.0)
Hemoglobin: 12.8 g/dL — ABNORMAL LOW (ref 13.0–17.0)
MCH: 31.5 pg (ref 26.0–34.0)
MCHC: 33.2 g/dL (ref 30.0–36.0)
MCV: 94.8 fL (ref 80.0–100.0)
Platelets: 167 10*3/uL (ref 150–400)
RBC: 4.06 MIL/uL — ABNORMAL LOW (ref 4.22–5.81)
RDW: 13.1 % (ref 11.5–15.5)
WBC: 9.7 10*3/uL (ref 4.0–10.5)
nRBC: 0 % (ref 0.0–0.2)

## 2022-03-03 LAB — GLUCOSE, CAPILLARY
Glucose-Capillary: 154 mg/dL — ABNORMAL HIGH (ref 70–99)
Glucose-Capillary: 162 mg/dL — ABNORMAL HIGH (ref 70–99)
Glucose-Capillary: 138 mg/dL — ABNORMAL HIGH (ref 70–99)
Glucose-Capillary: 121 mg/dL — ABNORMAL HIGH (ref 70–99)

## 2022-03-03 LAB — URINE CULTURE: Culture: NO GROWTH

## 2022-03-03 LAB — MAGNESIUM: Magnesium: 1.7 mg/dL (ref 1.7–2.4)

## 2022-03-03 MED ORDER — POTASSIUM CHLORIDE CRYS ER 20 MEQ PO TBCR
40.0000 meq | EXTENDED_RELEASE_TABLET | Freq: Once | ORAL | Status: AC
  Administered 2022-03-03: 40 meq via ORAL

## 2022-03-03 NOTE — Progress Notes (Signed)
PROGRESS NOTE     Ryan Garner, is a 76 y.o. male, DOB - 1946-05-12, [id number]:[id number]  Admit date - 03/01/2022   Admitting Physician Kindle Strohmeier Denton Brick, MD  Outpatient Primary MD for the patient is [name], [name], MD  LOS - 1  Chief Complaint  Patient presents with   Shortness of Breath       Brief Summary:- 76 y/o followed for HFpEF with intermittent SOB, CAD w/ h/o STEMI s/p PCI Stenting of single vessel, rate controlled A. Fib on Eliquis, HTN, HLD, pulm HTN, DM2, obesity and prior h/o cellulitis left LE admitted on 03/02/2022 with recurrent left lower extremity cellulitis patient failed outpatient Augmentin prior to admission   A/p 1) recurrent left lower extremity cellulitis--failed outpatient Augmentin -Currently on IV Rocephin -Added doxycycline for MRSA coverage WBC 13.3 >>9.7 -Slowly improving overall, still has significant erythema swelling ,tenderness , pain and warmth   2)DM2-continue Jardiance Use Novolog/Humalog Sliding scale insulin with Accu-Cheks/Fingersticks as ordered    3)PAFib--- continue metoprolol for rate control and Eliquis for stroke prophylaxis   4)HFpEF----chronic diastolic dysfunction CHF -Appears compensated at this time -Continue Lasix and metolazone   5)COPD--stable, no acute exacerbation, continue bronchodilators  Disposition/Need for in-Hospital Stay- patient unable to be discharged at this time due to  -Cellulitis that failed outpatient oral antibiotics... Requiring IV antibiotics  Status is: Inpatient   Disposition: The patient is from: Home              Anticipated d/c is to: Home              Anticipated d/c date is: 1 day              Patient currently is not medically stable to d/c. Barriers: Not Clinically Stable-   Code Status :  -  Code Status: Full Code   Family Communication:    NA (patient is alert, awake and coherent)   DVT Prophylaxis  :   - SCDs    apixaban (ELIQUIS) tablet 5 mg   Lab Results  Component Value Date    PLT 167 03/03/2022    Inpatient Medications  Scheduled Meds:  apixaban  5 mg Oral BID   atorvastatin  80 mg Oral Daily   clopidogrel  75 mg Oral Q breakfast   doxycycline  100 mg Oral Q12H   empagliflozin  10 mg Oral QAC breakfast   fluticasone  2 spray Each Nare Daily   furosemide  80 mg Oral Daily   insulin aspart  0-20 Units Subcutaneous TID WC   metolazone  5 mg Oral Q M,W,F   metoprolol tartrate  50 mg Oral BID   potassium chloride SA  40 mEq Oral Q M,W,F   senna  1 tablet Oral BID   sodium chloride flush  3 mL Intravenous Q12H   umeclidinium bromide  1 puff Inhalation Daily   Continuous Infusions:  sodium chloride     sodium chloride 100 mL/hr at 03/03/22 1610   cefTRIAXone (ROCEPHIN)  IV 2 g (03/02/22 2102)   PRN Meds:.sodium chloride, acetaminophen **OR** acetaminophen, albuterol, nitroGLYCERIN, sodium chloride flush   Anti-infectives (From admission, onward)    Start     Dose/Rate Route Frequency Ordered Stop   03/02/22 2200  cefTRIAXone (ROCEPHIN) 2 g in sodium chloride 0.9 % 100 mL IVPB  Status:  Discontinued        2 g 200 mL/hr over 30 Minutes Intravenous Every 24 hours 03/02/22 0101 03/02/22 0107   03/02/22 2200  cefTRIAXone (ROCEPHIN) 2 g in sodium chloride 0.9 % 100 mL IVPB        2 g 200 mL/hr over 30 Minutes Intravenous Every 24 hours 03/02/22 0108 03/09/22 2159   03/02/22 2200  doxycycline (VIBRA-TABS) tablet 100 mg        100 mg Oral Every 12 hours 03/02/22 1959     03/01/22 2200  cefTRIAXone (ROCEPHIN) 2 g in sodium chloride 0.9 % 100 mL IVPB  Status:  Discontinued        2 g 200 mL/hr over 30 Minutes Intravenous Every 24 hours 03/01/22 2159 03/02/22 0101         Subjective: Ryan Garner today has no fevers, no emesis,  No chest pain,   -Left leg pain and swelling/tenderness is not worse   Objective: Vitals:   03/02/22 2200 03/03/22 0437 03/03/22 0903 03/03/22 1456  BP:  113/84  104/68  Pulse:  60  71  Resp:  16  19  Temp:  98 F  (36.7 C)  98 F (36.7 C)  TempSrc:    Oral  SpO2: 98% 100% 95% 98%  Weight:      Height:        Intake/Output Summary (Last 24 hours) at 03/03/2022 1852 Last data filed at 03/03/2022 1300 Gross per 24 hour  Intake 1132.5 ml  Output --  Net 1132.5 ml   Filed Weights   03/01/22 2152 03/02/22 1735  Weight: 133.2 kg 134.3 kg    Physical Exam  Gen:- Awake Alert,   HEENT:- [place].AT, No sclera icterus Neck-Supple Neck,No JVD,.  Lungs-  CTAB , fair symmetrical air movement CV- S1, S2 normal, regular  Abd-  +ve B.Sounds, Abd Soft, No tenderness,    Extremity/Skin:-  pedal pulses present , -Slowly improving overall, still has significant erythema swelling ,tenderness , pain and warmth Psych-affect is appropriate, oriented x3 Neuro-no new focal deficits, no tremors  Data Reviewed: I have personally reviewed following labs and imaging studies  CBC: Recent Labs  Lab 03/01/22 2200 03/03/22 0556  WBC 13.3* 9.7  NEUTROABS 11.9*  --   HGB 14.3 12.8*  HCT 42.6 38.5*  MCV 93.0 94.8  PLT 213 601   Basic Metabolic Panel: Recent Labs  Lab 03/01/22 2200 03/03/22 0556  NA 135 135  K 3.2* 2.9*  CL 96* 99  CO2 28 27  GLUCOSE 136* 115*  BUN 40* 31*  CREATININE 1.54* 1.20  CALCIUM 9.2 8.4*  MG  --  1.7   GFR: Estimated Creatinine Clearance: 68 mL/min (by C-G formula based on SCr of 1.2 mg/dL). Liver Function Tests: Recent Labs  Lab 03/01/22 2200  AST 23  ALT 24  ALKPHOS 70  BILITOT 1.6*  PROT 7.0  ALBUMIN 3.7   Cardiac Enzymes: No results for input(s): "CKTOTAL", "CKMB", "CKMBINDEX", "TROPONINI" in the last 168 hours. BNP (last 3 results) No results for input(s): "PROBNP" in the last 8760 hours. HbA1C: No results for input(s): "HGBA1C" in the last 72 hours. Sepsis Labs: '@LABRCNTIP'$ (procalcitonin:4,lacticidven:4) ) Recent Results (from the past 240 hour(s))  Blood Culture (routine x 2)     Status: None (Preliminary result)   Collection Time: 03/01/22 10:17 PM    Specimen: BLOOD LEFT ARM  Result Value Ref Range Status   Specimen Description BLOOD LEFT ARM  Final   Special Requests   Final    BOTTLES DRAWN AEROBIC AND ANAEROBIC Blood Culture adequate volume   Culture   Final    NO GROWTH 2 DAYS Performed at  [facility], [address], [place], [place] [address]    Report Status PENDING  Incomplete  Blood Culture (routine x 2)     Status: None (Preliminary result)   Collection Time: 03/01/22 10:20 PM   Specimen: BLOOD RIGHT ARM  Result Value Ref Range Status   Specimen Description BLOOD RIGHT ARM  Final   Special Requests   Final    BOTTLES DRAWN AEROBIC AND ANAEROBIC Blood Culture adequate volume   Culture   Final    NO GROWTH 2 DAYS Performed at [facility], [address] [place], [place] [address]    Report Status PENDING  Incomplete  Resp Panel by RT-PCR (Flu A&B, Covid) Anterior Nasal Swab     Status: None   Collection Time: 03/01/22 10:27 PM   Specimen: Anterior Nasal Swab  Result Value Ref Range Status   SARS Coronavirus 2 by RT PCR NEGATIVE NEGATIVE Final    Comment: (NOTE) SARS-CoV-2 target nucleic acids are NOT DETECTED.  The SARS-CoV-2 RNA is generally detectable in upper respiratory specimens during the acute phase of infection. The lowest concentration of SARS-CoV-2 viral copies this assay can detect is 138 copies/mL. A negative result does not preclude SARS-Cov-2 infection and should not be used as the sole basis for treatment or other patient management decisions. A negative result may occur with  improper specimen collection/handling, submission of specimen other than nasopharyngeal swab, presence of viral mutation(s) within the areas targeted by this assay, and inadequate number of viral copies(<138 copies/mL). A negative result must be combined with clinical observations, patient history, and epidemiological information. The expected result is Negative.  Fact Sheet for Patients:   EntrepreneurPulse.com.au  Fact Sheet for Healthcare Providers:  IncredibleEmployment.be  This test is no t yet approved or cleared by the [place] FDA and  has been authorized for detection and/or diagnosis of SARS-CoV-2 by FDA under an Emergency Use Authorization (EUA). This EUA will remain  in effect (meaning this test can be used) for the duration of the COVID-19 declaration under Section 564(b)(1) of the Act, 21 U.S.C.section 360bbb-3(b)(1), unless the authorization is terminated  or revoked sooner.       Influenza A by PCR NEGATIVE NEGATIVE Final   Influenza B by PCR NEGATIVE NEGATIVE Final    Comment: (NOTE) The Xpert Xpress SARS-CoV-2/FLU/RSV plus assay is intended as an aid in the diagnosis of influenza from Nasopharyngeal swab specimens and should not be used as a sole basis for treatment. Nasal washings and aspirates are unacceptable for Xpert Xpress SARS-CoV-2/FLU/RSV testing.  Fact Sheet for Patients: EntrepreneurPulse.com.au  Fact Sheet for Healthcare Providers: IncredibleEmployment.be  This test is not yet approved or cleared by the [place] FDA and has been authorized for detection and/or diagnosis of SARS-CoV-2 by FDA under an Emergency Use Authorization (EUA). This EUA will remain in effect (meaning this test can be used) for the duration of the COVID-19 declaration under Section 564(b)(1) of the Act, 21 U.S.C. section 360bbb-3(b)(1), unless the authorization is terminated or revoked.  Performed at [facility], [address] [place], [place] [address]   Urine Culture     Status: None   Collection Time: 03/02/22  1:05 AM   Specimen: Urine, Clean Catch  Result Value Ref Range Status   Specimen Description   Final    URINE, CLEAN CATCH Performed at [facility], [address], [place],  [address]    Special Requests   Final    NONE Performed at [facility],  [address] [place], [place] [address]    Culture   Final    NO GROWTH Performed at [facility], [place] [address] [place], [place] [address]    Report Status 03/03/2022 FINAL  Final      Radiology Studies: DG Chest Port 1 View  Result Date: 03/01/2022 CLINICAL DATA:  Questionable sepsis - evaluate for abnormality EXAM: PORTABLE CHEST 1 VIEW COMPARISON:  Radiograph 08/22/2019, CT 01/01/2022 FINDINGS: Cardiomegaly. Stable mediastinal contours. Right pleural effusion is similar to prior. There is associated atelectasis at the right lung base. Evidence of confluent airspace disease. No pulmonary edema. No pneumothorax. Pulmonary nodules on prior CT are not well seen by radiograph. IMPRESSION: 1. Unchanged right pleural effusion and associated atelectasis. 2. Cardiomegaly without pulmonary edema. Electronically Signed   By: Keith Rake M.D.   On: 03/01/2022 22:14     Scheduled Meds:  apixaban  5 mg Oral BID   atorvastatin  80 mg Oral Daily   clopidogrel  75 mg Oral Q breakfast   doxycycline  100 mg Oral Q12H   empagliflozin  10 mg Oral QAC breakfast   fluticasone  2 spray Each Nare Daily   furosemide  80 mg Oral Daily   insulin aspart  0-20 Units Subcutaneous TID WC   metolazone  5 mg Oral Q M,W,F   metoprolol tartrate  50 mg Oral BID   potassium chloride SA  40 mEq Oral Q M,W,F   senna  1 tablet Oral BID   sodium chloride flush  3 mL Intravenous Q12H   umeclidinium bromide  1 puff Inhalation Daily   Continuous Infusions:  sodium chloride     sodium chloride 100 mL/hr at 03/03/22 1610   cefTRIAXone (ROCEPHIN)  IV 2 g (03/02/22 2102)     LOS: 1 day    Roxan Hockey M.D on 03/03/2022 at 6:52 PM  Go to [email or web address] - for contact info  [facility] - Office  [phone]  If 7PM-7AM, please contact night-coverage [email or web address] 03/03/2022, 6:52 PM

## 2022-03-04 DIAGNOSIS — L03116 Cellulitis of left lower limb: Secondary | ICD-10-CM | POA: Diagnosis not present

## 2022-03-04 LAB — BASIC METABOLIC PANEL
Anion gap: 9 (ref 5–15)
BUN: 25 mg/dL — ABNORMAL HIGH (ref 8–23)
CO2: 30 mmol/L (ref 22–32)
Calcium: 8.7 mg/dL — ABNORMAL LOW (ref 8.9–10.3)
Chloride: 96 mmol/L — ABNORMAL LOW (ref 98–111)
Creatinine, Ser: 1.26 mg/dL — ABNORMAL HIGH (ref 0.61–1.24)
GFR, Estimated: 58 mL/min — ABNORMAL LOW (ref >60)
Glucose, Bld: 110 mg/dL — ABNORMAL HIGH (ref 70–99)
Potassium: 3.2 mmol/L — ABNORMAL LOW (ref 3.5–5.1)
Sodium: 135 mmol/L (ref 135–145)

## 2022-03-04 LAB — GLUCOSE, CAPILLARY
Glucose-Capillary: 113 mg/dL — ABNORMAL HIGH (ref 70–99)
Glucose-Capillary: 211 mg/dL — ABNORMAL HIGH (ref 70–99)
Glucose-Capillary: 117 mg/dL — ABNORMAL HIGH (ref 70–99)
Glucose-Capillary: 156 mg/dL — ABNORMAL HIGH (ref 70–99)

## 2022-03-04 MED ORDER — POTASSIUM CHLORIDE CRYS ER 20 MEQ PO TBCR
40.0000 meq | EXTENDED_RELEASE_TABLET | ORAL | Status: AC
  Administered 2022-03-04 (×2): 40 meq via ORAL
  Filled 2022-03-04 (×2): qty 2

## 2022-03-04 NOTE — Progress Notes (Signed)
PROGRESS NOTE     Ryan Garner, is a 76 y.o. male, DOB - Nov 13, 1945, WCH:[id number]  Admit date - 03/01/2022   Admitting Physician Cinthya Bors Denton Brick, MD  Outpatient Primary MD for the patient is [name], [name], MD  LOS - 2  Chief Complaint  Patient presents with   Shortness of Breath       Brief Summary:- 76 y/o followed for HFpEF with intermittent SOB, CAD w/ h/o STEMI s/p PCI Stenting of single vessel, rate controlled A. Fib on Eliquis, HTN, HLD, pulm HTN, DM2, obesity and prior h/o cellulitis left LE admitted on 03/02/2022 with recurrent left lower extremity cellulitis patient failed outpatient Augmentin prior to admission   A/p 1) recurrent left lower extremity cellulitis--failed outpatient Augmentin -Currently on IV Rocephin -Continue doxycycline for MRSA coverage WBC 13.3 >>9.7 -Slowly improving overall, still has significant erythema swelling ,tenderness , pain and warmth -Patient complains of persistent left leg swelling and discomfort   2)DM2-continue Jardiance Use Novolog/Humalog Sliding scale insulin with Accu-Cheks/Fingersticks as ordered    3)PAFib--- continue metoprolol for rate control and Eliquis for stroke prophylaxis   4)HFpEF----chronic diastolic dysfunction CHF -Appears compensated at this time -Continue Lasix and metolazone   5)COPD--stable, no acute exacerbation, continue bronchodilators  6)AKI----acute kidney injury on CKD stage -   - creatinine on admission= 1.54,  baseline creatinine = 1.2   ,  creatinine is now= 1.26,  --renally adjust medications, avoid nephrotoxic agents / dehydration  / hypotension   Disposition/Need for in-Hospital Stay- patient unable to be discharged at this time due to  -Cellulitis that failed outpatient oral antibiotics... Requiring IV antibiotics  Status is: Inpatient   Disposition: The patient is from: Home              Anticipated d/c is to: Home              Anticipated d/c date is: 1 day               Patient currently is not medically stable to d/c. Barriers: Not Clinically Stable-   Code Status :  -  Code Status: Full Code   Family Communication:    NA (patient is alert, awake and coherent)   DVT Prophylaxis  :   - SCDs    apixaban (ELIQUIS) tablet 5 mg   Lab Results  Component Value Date   PLT 167 03/03/2022    Inpatient Medications  Scheduled Meds:  apixaban  5 mg Oral BID   atorvastatin  80 mg Oral Daily   clopidogrel  75 mg Oral Q breakfast   doxycycline  100 mg Oral Q12H   empagliflozin  10 mg Oral QAC breakfast   fluticasone  2 spray Each Nare Daily   furosemide  80 mg Oral Daily   insulin aspart  0-20 Units Subcutaneous TID WC   metolazone  5 mg Oral Q M,W,F   metoprolol tartrate  50 mg Oral BID   potassium chloride SA  40 mEq Oral Q M,W,F   sodium chloride flush  3 mL Intravenous Q12H   umeclidinium bromide  1 puff Inhalation Daily   Continuous Infusions:  sodium chloride     sodium chloride 100 mL/hr at 03/04/22 0047   cefTRIAXone (ROCEPHIN)  IV 2 g (03/03/22 2046)   PRN Meds:.sodium chloride, acetaminophen **OR** acetaminophen, albuterol, nitroGLYCERIN, sodium chloride flush   Anti-infectives (From admission, onward)    Start     Dose/Rate Route Frequency Ordered Stop   03/02/22 2200  cefTRIAXone (  ROCEPHIN) 2 g in sodium chloride 0.9 % 100 mL IVPB  Status:  Discontinued        2 g 200 mL/hr over 30 Minutes Intravenous Every 24 hours 03/02/22 0101 03/02/22 0107   03/02/22 2200  cefTRIAXone (ROCEPHIN) 2 g in sodium chloride 0.9 % 100 mL IVPB        2 g 200 mL/hr over 30 Minutes Intravenous Every 24 hours 03/02/22 0108 03/09/22 2159   03/02/22 2200  doxycycline (VIBRA-TABS) tablet 100 mg        100 mg Oral Every 12 hours 03/02/22 1959     03/01/22 2200  cefTRIAXone (ROCEPHIN) 2 g in sodium chloride 0.9 % 100 mL IVPB  Status:  Discontinued        2 g 200 mL/hr over 30 Minutes Intravenous Every 24 hours 03/01/22 2159 03/02/22 0101          Subjective: Ryan Garner today has no fevers, no emesis,  No chest pain,    -Talked to patient about possible discharge home on oral antibiotics on 03/04/2022----patient states my leg is still very swollen and painful having difficulty walking I do not think I am ready to go home today - Objective: Vitals:   03/03/22 2147 03/04/22 0600 03/04/22 0948 03/04/22 1422  BP: 116/68 92/66  119/73  Pulse: 91 73  70  Resp: '19 20  19  '$ Temp: 99.1 F (37.3 C) 97.9 F (36.6 C)  98 F (36.7 C)  TempSrc: Oral Oral  Oral  SpO2: 99% 100% 93% 100%  Weight:      Height:        Intake/Output Summary (Last 24 hours) at 03/04/2022 1801 Last data filed at 03/04/2022 1300 Gross per 24 hour  Intake 2351.56 ml  Output --  Net 2351.56 ml   Filed Weights   03/01/22 2152 03/02/22 1735  Weight: 133.2 kg 134.3 kg    Physical Exam  Gen:- Awake Alert,   HEENT:- [place].AT, No sclera icterus Neck-Supple Neck,No JVD,.  Lungs-  CTAB , fair symmetrical air movement CV- S1, S2 normal, regular  Abd-  +ve B.Sounds, Abd Soft, No tenderness,    Extremity/Skin:-  pedal pulses present , -Slowly improving overall, still has significant erythema swelling ,tenderness , pain and warmth Psych-affect is appropriate, oriented x3 Neuro-no new focal deficits, no tremors  Data Reviewed: I have personally reviewed following labs and imaging studies  CBC: Recent Labs  Lab 03/01/22 2200 03/03/22 0556  WBC 13.3* 9.7  NEUTROABS 11.9*  --   HGB 14.3 12.8*  HCT 42.6 38.5*  MCV 93.0 94.8  PLT 213 010   Basic Metabolic Panel: Recent Labs  Lab 03/01/22 2200 03/03/22 0556 03/04/22 0541  NA 135 135 135  K 3.2* 2.9* 3.2*  CL 96* 99 96*  CO2 '28 27 30  '$ GLUCOSE 136* 115* 110*  BUN 40* 31* 25*  CREATININE 1.54* 1.20 1.26*  CALCIUM 9.2 8.4* 8.7*  MG  --  1.7  --    GFR: Estimated Creatinine Clearance: 64.8 mL/min (A) (by C-G formula based on SCr of 1.26 mg/dL (H)). Liver Function Tests: Recent Labs  Lab  03/01/22 2200  AST 23  ALT 24  ALKPHOS 70  BILITOT 1.6*  PROT 7.0  ALBUMIN 3.7   Recent Results (from the past 240 hour(s))  Blood Culture (routine x 2)     Status: None (Preliminary result)   Collection Time: 03/01/22 10:17 PM   Specimen: BLOOD LEFT ARM  Result Value Ref Range Status  Specimen Description BLOOD LEFT ARM  Final   Special Requests   Final    BOTTLES DRAWN AEROBIC AND ANAEROBIC Blood Culture adequate volume   Culture   Final    NO GROWTH 3 DAYS Performed at [facility], [address], [place], [place] [address]    Report Status PENDING  Incomplete  Blood Culture (routine x 2)     Status: None (Preliminary result)   Collection Time: 03/01/22 10:20 PM   Specimen: BLOOD RIGHT ARM  Result Value Ref Range Status   Specimen Description BLOOD RIGHT ARM  Final   Special Requests   Final    BOTTLES DRAWN AEROBIC AND ANAEROBIC Blood Culture adequate volume   Culture   Final    NO GROWTH 3 DAYS Performed at [facility], [address], [place], [place] [address]    Report Status PENDING  Incomplete  Resp Panel by RT-PCR (Flu A&B, Covid) Anterior Nasal Swab     Status: None   Collection Time: 03/01/22 10:27 PM   Specimen: Anterior Nasal Swab  Result Value Ref Range Status   SARS Coronavirus 2 by RT PCR NEGATIVE NEGATIVE Final    Comment: (NOTE) SARS-CoV-2 target nucleic acids are NOT DETECTED.  The SARS-CoV-2 RNA is generally detectable in upper respiratory specimens during the acute phase of infection. The lowest concentration of SARS-CoV-2 viral copies this assay can detect is 138 copies/mL. A negative result does not preclude SARS-Cov-2 infection and should not be used as the sole basis for treatment or other patient management decisions. A negative result may occur with  improper specimen collection/handling, submission of specimen other than nasopharyngeal swab, presence of viral mutation(s) within the areas targeted by this assay, and inadequate number of  viral copies(<138 copies/mL). A negative result must be combined with clinical observations, patient history, and epidemiological information. The expected result is Negative.  Fact Sheet for Patients:  EntrepreneurPulse.com.au  Fact Sheet for Healthcare Providers:  IncredibleEmployment.be  This test is no t yet approved or cleared by the [place] FDA and  has been authorized for detection and/or diagnosis of SARS-CoV-2 by FDA under an Emergency Use Authorization (EUA). This EUA will remain  in effect (meaning this test can be used) for the duration of the COVID-19 declaration under Section 564(b)(1) of the Act, 21 U.S.C.section 360bbb-3(b)(1), unless the authorization is terminated  or revoked sooner.       Influenza A by PCR NEGATIVE NEGATIVE Final   Influenza B by PCR NEGATIVE NEGATIVE Final    Comment: (NOTE) The Xpert Xpress SARS-CoV-2/FLU/RSV plus assay is intended as an aid in the diagnosis of influenza from Nasopharyngeal swab specimens and should not be used as a sole basis for treatment. Nasal washings and aspirates are unacceptable for Xpert Xpress SARS-CoV-2/FLU/RSV testing.  Fact Sheet for Patients: EntrepreneurPulse.com.au  Fact Sheet for Healthcare Providers: IncredibleEmployment.be  This test is not yet approved or cleared by the [place] FDA and has been authorized for detection and/or diagnosis of SARS-CoV-2 by FDA under an Emergency Use Authorization (EUA). This EUA will remain in effect (meaning this test can be used) for the duration of the COVID-19 declaration under Section 564(b)(1) of the Act, 21 U.S.C. section 360bbb-3(b)(1), unless the authorization is terminated or revoked.  Performed at [facility], [address] [place], [place] [address]   Urine Culture     Status: None   Collection Time: 03/02/22  1:05 AM   Specimen: Urine, Clean Catch  Result Value Ref  Range Status  Specimen Description   Final    URINE, CLEAN CATCH Performed at [facility], [address], [place], [place] [address]    Special Requests   Final    NONE Performed at [facility], [address], [place], [place] [address]    Culture   Final    NO GROWTH Performed at [facility], [place] [address], [place],  [address]    Report Status 03/03/2022 FINAL  Final    Radiology Studies: No results found.  Scheduled Meds:  apixaban  5 mg Oral BID   atorvastatin  80 mg Oral Daily   clopidogrel  75 mg Oral Q breakfast   doxycycline  100 mg Oral Q12H   empagliflozin  10 mg Oral QAC breakfast   fluticasone  2 spray Each Nare Daily   furosemide  80 mg Oral Daily   insulin aspart  0-20 Units Subcutaneous TID WC   metolazone  5 mg Oral Q M,W,F   metoprolol tartrate  50 mg Oral BID   potassium chloride SA  40 mEq Oral Q M,W,F   sodium chloride flush  3 mL Intravenous Q12H   umeclidinium bromide  1 puff Inhalation Daily   Continuous Infusions:  sodium chloride     sodium chloride 100 mL/hr at 03/04/22 0047   cefTRIAXone (ROCEPHIN)  IV 2 g (03/03/22 2046)    LOS: 2 days   Roxan Hockey M.D on 03/04/2022 at 6:01 PM  Go to [email or web address] - for contact info  [facility] - Office  [phone]  If 7PM-7AM, please contact night-coverage [email or web address] 03/04/2022, 6:01 PM

## 2022-03-05 LAB — GLUCOSE, CAPILLARY: Glucose-Capillary: 116 mg/dL — ABNORMAL HIGH (ref 70–99)

## 2022-03-05 MED ORDER — CEFDINIR 300 MG PO CAPS: 300.0000 mg | ORAL_CAPSULE | Freq: Two times a day (BID) | ORAL | 0 refills | Status: DC

## 2022-03-05 MED ORDER — SPIRIVA RESPIMAT 1.25 MCG/ACT IN AERS: 2.0000 | INHALATION_SPRAY | Freq: Every day | RESPIRATORY_TRACT | 3 refills | Status: DC

## 2022-03-05 MED ORDER — ALBUTEROL SULFATE HFA 108 (90 BASE) MCG/ACT IN AERS: 2.0000 | INHALATION_SPRAY | Freq: Four times a day (QID) | RESPIRATORY_TRACT | 2 refills | Status: DC | PRN

## 2022-03-05 MED ORDER — DOXYCYCLINE HYCLATE 100 MG PO TABS: 100.0000 mg | ORAL_TABLET | Freq: Two times a day (BID) | ORAL | 0 refills | Status: DC

## 2022-03-05 NOTE — Plan of Care (Signed)
  Problem: Education: Goal: Ability to describe self-care measures that may prevent or decrease complications (Diabetes Survival Skills Education) will improve Outcome: Progressing   Problem: Health Behavior/Discharge Planning: Goal: Ability to identify and utilize available resources and services will improve Outcome: Progressing

## 2022-03-05 NOTE — Care Management Important Message (Signed)
Important Message  Patient Details  Name: Ryan Garner MRN: [id number] Date of Birth: [birth year]   Medicare Important Message Given:  Yes     Tommy Medal 03/05/2022, 10:23 AM

## 2022-03-05 NOTE — Progress Notes (Signed)
IV removed and DC instructions reviewed.  Scripts sent to pharmacy and to follow up with primary MD in a week.  Cardiology appt scheduled.  Waiting for ride.  Friend to drive home

## 2022-03-05 NOTE — TOC Transition Note (Signed)
Transition of Care [facility]) - CM/SW Discharge Note   Patient Details  Name: Ryan Garner MRN: [id number] Date of Birth: 1945-08-11  [facility] CM/SW Contact:  Shade Flood, LCSW Phone Number: 03/05/2022, 12:23 PM   Clinical Narrative:     Pt stable for dc home today per MD. Damaris Schooner with pt about dc planning. HHRN recommended for follow up care at home. Discussed with pt who is agreeable to referral. CMS provider options reviewed and referred as requested. Information added to AVS.  [facility] will follow up with pt at home. There are no other TOC needs for dc.  Expected Discharge Plan: [place] Barriers to Discharge: Barriers Resolved   Patient Goals and CMS Choice Patient states their goals for this hospitalization and ongoing recovery are:: go home CMS Medicare.gov Compare Post Acute Care list provided to:: Patient Choice offered to / list presented to : Patient  Expected Discharge Plan and Services Expected Discharge Plan: [place] In-house Referral: Clinical Social Work   Post Acute Care Choice: [place] arrangements for the past 2 months: [place] Expected Discharge Date: 03/05/22                         HH Arranged: RN [place] Agency: [place] ([place]) Date [place]: 03/05/22   Representative spoke with at [place]  Prior Living Arrangements/Services Living arrangements for the past 2 months: [place] Lives with:: Spouse Patient language and need for interpreter reviewed:: Yes Do you feel safe going back to the place where you live?: Yes      Need for Family Participation in Patient Care: No (Comment) Care giver support system in place?: Yes (comment)   Criminal Activity/Legal Involvement Pertinent to Current Situation/Hospitalization: No - Comment as needed  Activities of Daily Living Home Assistive Devices/Equipment: None ADL Screening (condition at  time of admission) Patient's cognitive ability adequate to safely complete daily activities?: Yes Is the patient deaf or have difficulty hearing?: Yes Does the patient have difficulty seeing, even when wearing glasses/contacts?: Yes Does the patient have difficulty concentrating, remembering, or making decisions?: No Patient able to express need for assistance with ADLs?: Yes Does the patient have difficulty dressing or bathing?: Yes Independently performs ADLs?: Yes (appropriate for developmental age) Does the patient have difficulty walking or climbing stairs?: Yes Weakness of Legs: Left Weakness of Arms/Hands: None  Permission Sought/Granted Permission sought to share information with : Facility Art therapist granted to share information with : Yes, Verbal Permission Granted     Permission granted to share info w AGENCY: HH        Emotional Assessment   Attitude/Demeanor/Rapport: Engaged Affect (typically observed): Pleasant Orientation: : Oriented to Self, Oriented to Place, Oriented to  Time, Oriented to Situation Alcohol / Substance Use: Not Applicable Psych Involvement: No (comment)  Admission diagnosis:  Peripheral edema [R60.9] Cellulitis and abscess of leg [L03.119, L02.419] Pulmonary hypertension (HCC) [I27.20] Cellulitis of left leg [L03.116] Cellulitis of left lower extremity [L03.116] Patient Active Problem List   Diagnosis Date Noted   Cellulitis of left lower extremity 03/02/2022   Chronic diastolic CHF (congestive heart failure) ([place]) 03/02/2022   Cellulitis of left leg 03/02/2022   Cellulitis and abscess of leg 03/02/2022   Foot callus 12/15/2021   Other secondary pulmonary hypertension ([place]) 08/30/2021   COPD (chronic obstructive pulmonary disease) ([place]) 08/30/2021   Coronary artery disease  due to type 2 diabetes mellitus ([place]) 07/21/2020   CHF (congestive heart failure) ([place]) 07/21/2020   History of ST elevation myocardial infarction  (STEMI) 04/30/2020   Atrial fibrillation ([place]) 02/29/2020   Lumbar radiculopathy 03/24/2019   Morbid obesity ([place]) 09/04/2018   Lung nodules 01/17/2017   Ascending aortic aneurysm 01/17/2017   Type 2 diabetes mellitus ([place]) 03/20/2016   Tear of lateral meniscus of right knee 11/19/2014   Hypertension    Hyperlipidemia    Metabolic syndrome    Osteoarthritis of both knees 07/06/2013   Acquired spondylolisthesis 03/06/2013   Degeneration of lumbar intervertebral disc 03/06/2013   Lumbar spondylosis 03/06/2013   Spinal stenosis of lumbar region 03/06/2013   PCP:  [name], [name], MD Pharmacy:   [facility], [place] [address] Phone: [phone] Fax: [place] [address] [place] [address] Phone: [phone] Fax: [phone]  [facility] [address] [address] Phone: [phone] Fax: [phone]     Social Determinants of Health (SDOH) Interventions    Readmission Risk Interventions     No data to display           Final next level of care: [place] Barriers to Discharge: Barriers Resolved   Patient Goals and CMS Choice Patient states their goals for this hospitalization and ongoing recovery are:: go home CMS Medicare.gov Compare Post Acute Care list provided to:: Patient Choice offered to / list presented to : Patient  Discharge Placement                       Discharge Plan and Services In-house Referral: Clinical Social Work   Post Acute Care Choice: Home Health                    HH Arranged: RN [facility] Agency: [place] ([place]) Date [place]: 03/05/22   Representative spoke with at [place]: Caryl Pina  Social Determinants of Health ([place]) Interventions     Readmission Risk Interventions     No data to display

## 2022-03-05 NOTE — Discharge Instructions (Signed)
1)Very low-salt diet advised 2)Weigh yourself daily, call if you gain more than 3 pounds in 1 day or more than 5 pounds in 1 week as your diuretic medications may need to be adjusted 3)Avoid ibuprofen/Advil/Aleve/Motrin/Goody Powders/Naproxen/BC powders/Meloxicam/Diclofenac/Indomethacin and other Nonsteroidal anti-inflammatory medications as these will make you more likely to bleed and can cause stomach ulcers, can also cause Kidney problems.  4) repeat CBC and BMP blood test with primary care physician within a

## 2022-03-06 ENCOUNTER — Telehealth: Payer: Self-pay | Admitting: Family Medicine

## 2022-03-06 ENCOUNTER — Encounter: Payer: Self-pay | Admitting: *Deleted

## 2022-03-06 ENCOUNTER — Telehealth: Payer: Self-pay | Admitting: *Deleted

## 2022-03-06 DIAGNOSIS — Z87891 Personal history of nicotine dependence: Secondary | ICD-10-CM | POA: Diagnosis not present

## 2022-03-06 DIAGNOSIS — I2510 Atherosclerotic heart disease of native coronary artery without angina pectoris: Secondary | ICD-10-CM | POA: Diagnosis not present

## 2022-03-06 DIAGNOSIS — Z7901 Long term (current) use of anticoagulants: Secondary | ICD-10-CM | POA: Diagnosis not present

## 2022-03-06 DIAGNOSIS — E669 Obesity, unspecified: Secondary | ICD-10-CM | POA: Diagnosis not present

## 2022-03-06 DIAGNOSIS — E785 Hyperlipidemia, unspecified: Secondary | ICD-10-CM | POA: Diagnosis not present

## 2022-03-06 DIAGNOSIS — Z955 Presence of coronary angioplasty implant and graft: Secondary | ICD-10-CM | POA: Diagnosis not present

## 2022-03-06 DIAGNOSIS — E8881 Metabolic syndrome: Secondary | ICD-10-CM | POA: Diagnosis not present

## 2022-03-06 DIAGNOSIS — L03116 Cellulitis of left lower limb: Secondary | ICD-10-CM | POA: Diagnosis not present

## 2022-03-06 DIAGNOSIS — E1122 Type 2 diabetes mellitus with diabetic chronic kidney disease: Secondary | ICD-10-CM | POA: Diagnosis not present

## 2022-03-06 DIAGNOSIS — I480 Paroxysmal atrial fibrillation: Secondary | ICD-10-CM | POA: Diagnosis not present

## 2022-03-06 DIAGNOSIS — N179 Acute kidney failure, unspecified: Secondary | ICD-10-CM | POA: Diagnosis not present

## 2022-03-06 DIAGNOSIS — I272 Pulmonary hypertension, unspecified: Secondary | ICD-10-CM | POA: Diagnosis not present

## 2022-03-06 DIAGNOSIS — I5043 Acute on chronic combined systolic (congestive) and diastolic (congestive) heart failure: Secondary | ICD-10-CM | POA: Diagnosis not present

## 2022-03-06 DIAGNOSIS — J449 Chronic obstructive pulmonary disease, unspecified: Secondary | ICD-10-CM | POA: Diagnosis not present

## 2022-03-06 DIAGNOSIS — I130 Hypertensive heart and chronic kidney disease with heart failure and stage 1 through stage 4 chronic kidney disease, or unspecified chronic kidney disease: Secondary | ICD-10-CM | POA: Diagnosis not present

## 2022-03-06 DIAGNOSIS — N189 Chronic kidney disease, unspecified: Secondary | ICD-10-CM | POA: Diagnosis not present

## 2022-03-06 DIAGNOSIS — Z6841 Body Mass Index (BMI) 40.0 and over, adult: Secondary | ICD-10-CM | POA: Diagnosis not present

## 2022-03-06 LAB — CULTURE, BLOOD (ROUTINE X 2)
Culture: NO GROWTH
Culture: NO GROWTH
Special Requests: ADEQUATE
Special Requests: ADEQUATE

## 2022-03-06 NOTE — Telephone Encounter (Signed)
Sharyn Lull called from [facility] stating that pt is being admitted for [place]. Sending order for PT eval. Also says that pt is on an antibiotic called Cefdinir and insurance is not covering it. Needs another antibiotic sent in that insurance will pay for.

## 2022-03-06 NOTE — Patient Outreach (Signed)
  Care Coordination [facility] Note Transition Care Management Follow-up Telephone Call Date of discharge and from where: 03/04/22 from [facility] How have you been since you were released from the hospital? "not much better"  Any questions or concerns? No  Items Reviewed: Did the pt receive and understand the discharge instructions provided? Yes  Medications obtained and verified? Yes  Other? Yes  Reviewed discharge instructions: 1)very low salt diet 2)weigh daily. call cardio or PCP if weight gain of > 3 lbs overnight or 5 lbs in one week 3) avoid NSAIDS 4)repeat CBC and bmp with PCP Any new allergies since your discharge? No  Dietary orders reviewed? Yes Do you have support at home? Yes   Home Care and Equipment/Supplies: Were home health services ordered? yes If so, what is the name of the agency? [facility]  Has the agency set up a time to come to the patient's home? yes Were any new equipment or medical supplies ordered?  No What is the name of the medical supply agency? N/a Were you able to get the supplies/equipment? not applicable Do you have any questions related to the use of the equipment or supplies? No  Functional Questionnaire: (I = Independent and D = Dependent) ADLs: D  Bathing/Dressing- D  Meal Prep- D  Eating- I   Maintaining continence- I  Transferring/Ambulation- I  Managing Meds- I  Follow up appointments reviewed:  PCP Hospital f/u appt confirmed? Yes  Scheduled to see Dr [name] on 03/08/22 @ 3:40 ([place] outreached PCP office to schedule). [place] [facility] f/u appt confirmed? Yes  Scheduled to see Dr Harrington Challenger on 05/11/22 @ 8:40 (routine visit. Needs hosp f/u). Are transportation arrangements needed? No  If their condition worsens, is the pt aware to call PCP or go to the Emergency Dept.? Yes Was the patient provided with contact information for the PCP's office or ED? Yes Was to pt encouraged to call back with questions or concerns? Yes  SDOH  assessments and interventions completed:   Yes  Care Coordination Interventions Activated:  Yes   Care Coordination Interventions:  Referred for Care Coordination Services:  RN Care Coordinator    Encounter Outcome:  Pt. Visit Completed

## 2022-03-06 NOTE — Telephone Encounter (Signed)
Dr. Warrick Parisian to address

## 2022-03-07 NOTE — Telephone Encounter (Signed)
Patient is coming in tomorrow for an appointment, we can address then.  And I do know where they have sent the antibiotic for him because it was from the emergency department but it looks like with good [email or web address] he can get the antibiotic for $23, if that is still too much then we can reassess tomorrow when we are seeing him as well

## 2022-03-08 ENCOUNTER — Ambulatory Visit (INDEPENDENT_AMBULATORY_CARE_PROVIDER_SITE_OTHER): Payer: Medicare Other | Admitting: Family Medicine

## 2022-03-08 ENCOUNTER — Encounter: Payer: Self-pay | Admitting: Family Medicine

## 2022-03-08 VITALS — BP 112/74 | HR 96 | Temp 97.5°F | Ht 68.0 in | Wt 288.0 lb

## 2022-03-08 DIAGNOSIS — L03116 Cellulitis of left lower limb: Secondary | ICD-10-CM | POA: Diagnosis not present

## 2022-03-08 MED ORDER — CEFDINIR 300 MG PO CAPS: 300.0000 mg | ORAL_CAPSULE | Freq: Two times a day (BID) | ORAL | 0 refills | Status: AC

## 2022-03-08 MED ORDER — DOXYCYCLINE HYCLATE 100 MG PO TABS: 100.0000 mg | ORAL_TABLET | Freq: Two times a day (BID) | ORAL | 0 refills | Status: AC

## 2022-03-08 MED ORDER — TRAMADOL HCL 50 MG PO TABS: 50.0000 mg | ORAL_TABLET | Freq: Two times a day (BID) | ORAL | 0 refills | Status: DC | PRN

## 2022-03-08 NOTE — Progress Notes (Signed)
BP 112/74   Pulse 96   Temp (!) 97.5 F (36.4 C)   Ht _0  (1.727 m)   Wt 288 lb (130.6 kg)   SpO2 98%   BMI 43.79 kg/m    Subjective:   Patient ID: Ryan Garner, male    DOB: 04-30-1946, 76 y.o.   MRN: [id number]  HPI: JOHSUA Garner is a 76 y.o. male presenting on 03/08/2022 for Hospitalization Follow-up (Cellulitis/)   HPI Transition Care Management Follow-up Telephone Call Date of discharge and from where: 03/04/22 from [facility] How have you been since you were released from the hospital? "not much better"  Any questions or concerns? No Patient was contacted on 03/06/2022 by Trina Ao RN  Transition of care office visit Patient was in the hospital and was admitted on 03/01/2022 and discharged on 03/04/2022 for cellulitis of left lower extremity and was given Rocephin and admitted for IV antibiotics.  Patient was discharged on cefdinir and doxycycline.  He said it was getting better when he was given IV antibiotics in the hospital but since he is gotten out of the hospital it has been getting worse again now he has more redness and more swelling and more pain than when he left the hospital.  He did not get the cefdinir until just yesterday because it was out of stock but has been taking the doxycycline since he left the hospital.  Relevant past medical, surgical, family and social history reviewed and updated as indicated. Interim medical history since our last visit reviewed. Allergies and medications reviewed and updated.  Review of Systems  Constitutional:  Negative for chills and fever.  Eyes:  Negative for visual disturbance.  Respiratory:  Negative for shortness of breath and wheezing.   Cardiovascular:  Negative for chest pain and leg swelling.  Skin:  Positive for color change, rash and wound.  Neurological:  Negative for dizziness, weakness and light-headedness.  All other systems reviewed and are negative.   Per HPI unless specifically indicated  above   Allergies as of 03/08/2022       Reactions   Flecainide    Per patient dizziness, shortness of breath, blurred vision   Rofecoxib Other (See Comments)   (Vioxx)        Medication List        Accurate as of March 08, 2022  4:16 PM. If you have any questions, ask your nurse or doctor.          STOP taking these medications    empagliflozin 10 MG Tabs tablet Commonly known as: Jardiance Stopped by: [name] Rotha Cassels, MD   metolazone 5 MG tablet Commonly known as: ZAROXOLYN Stopped by: Worthy Rancher, MD       TAKE these medications    albuterol 108 (90 Base) MCG/ACT inhaler Commonly known as: VENTOLIN HFA Inhale 2 puffs into the lungs every 6 (six) hours as needed for wheezing or shortness of breath.   apixaban 5 MG Tabs tablet Commonly known as: ELIQUIS Take 1 tablet (5 mg total) by mouth 2 (two) times daily.   atorvastatin 80 MG tablet Commonly known as: LIPITOR Take 1 tablet by mouth once daily   cefdinir 300 MG capsule Commonly known as: OMNICEF Take 1 capsule (300 mg total) by mouth 2 (two) times daily for 5 days.   clopidogrel 75 MG tablet Commonly known as: PLAVIX Take 1 tablet (75 mg total) by mouth daily with breakfast.   doxycycline 100 MG tablet Commonly  known as: VIBRA-TABS Take 1 tablet (100 mg total) by mouth 2 (two) times daily for 5 days.   furosemide 40 MG tablet Commonly known as: LASIX Take 2 tablets by mouth once daily   metoprolol tartrate 50 MG tablet Commonly known as: LOPRESSOR Take 1 tablet (50 mg total) by mouth 2 (two) times daily.   multivitamin with minerals Tabs tablet Take 1 tablet by mouth daily.   nitroGLYCERIN 0.4 MG SL tablet Commonly known as: NITROSTAT Place 1 tablet (0.4 mg total) under the tongue every 5 (five) minutes x 3 doses as needed for chest pain.   nystatin powder Commonly known as: MYCOSTATIN/NYSTOP APPLY  POWDER TOPICALLY TO AFFECTED AREA TWICE DAILY What changed: See the new  instructions.   potassium chloride SA 20 MEQ tablet Commonly known as: Klor-Con M20 Take 40 meq on Monday,Wednesday,Friday, take 20 meq all other days   Prostate Support 300-15 MG Tabs Take 1 tablet by mouth daily. Super Beta Prostate   Spiriva Respimat 1.25 MCG/ACT Aers Generic drug: Tiotropium Bromide Monohydrate Inhale 2 puffs into the lungs daily.   traMADol 50 MG tablet Commonly known as: ULTRAM Take 1 tablet (50 mg total) by mouth 2 (two) times daily as needed. Started by: [name] Anjela Cassara, MD         Objective:   BP 112/74   Pulse 96   Temp (!) 97.5 F (36.4 C)   Ht _0  (1.727 m)   Wt 288 lb (130.6 kg)   SpO2 98%   BMI 43.79 kg/m   Wt Readings from Last 3 Encounters:  03/08/22 288 lb (130.6 kg)  03/02/22 296 lb (134.3 kg)  01/25/22 293 lb 9.6 oz (133.2 kg)    Physical Exam Vitals and nursing note reviewed.  Constitutional:      Appearance: Normal appearance.  Musculoskeletal:        General: Swelling present.  Skin:    Findings: Erythema (Patient has swelling and erythema on his left lower extremity from the ankle up to the knee and swelling on the right lower extremity but only erythema near the ankle.) present.  Neurological:     Mental Status: He is alert.       Nurse to place [employer] on both sides.  Assessment & Plan:   Problem List Items Addressed This Visit       Other   Cellulitis of left lower extremity - Primary   Relevant Orders   CBC with Differential/Platelet   CMP14+EGFR    Patient's legs are worsening slightly since he left, will refill both of his antibiotics we will place Unna boots. Follow up plan: Return if symptoms worsen or fail to improve, for 5 to 7-day, recheck cellulitis and Unna boot.  Counseling provided for all of the vaccine components Orders Placed This Encounter  Procedures   CBC with Differential/Platelet   [place] Symphanie Cederberg, MD [facility] 03/08/2022, 4:16  PM

## 2022-03-09 ENCOUNTER — Ambulatory Visit (INDEPENDENT_AMBULATORY_CARE_PROVIDER_SITE_OTHER): Payer: Medicare Other

## 2022-03-09 DIAGNOSIS — I2510 Atherosclerotic heart disease of native coronary artery without angina pectoris: Secondary | ICD-10-CM | POA: Diagnosis not present

## 2022-03-09 DIAGNOSIS — L03116 Cellulitis of left lower limb: Secondary | ICD-10-CM | POA: Diagnosis not present

## 2022-03-09 DIAGNOSIS — E1122 Type 2 diabetes mellitus with diabetic chronic kidney disease: Secondary | ICD-10-CM

## 2022-03-09 DIAGNOSIS — J449 Chronic obstructive pulmonary disease, unspecified: Secondary | ICD-10-CM | POA: Diagnosis not present

## 2022-03-09 DIAGNOSIS — N189 Chronic kidney disease, unspecified: Secondary | ICD-10-CM | POA: Diagnosis not present

## 2022-03-09 DIAGNOSIS — I272 Pulmonary hypertension, unspecified: Secondary | ICD-10-CM | POA: Diagnosis not present

## 2022-03-09 DIAGNOSIS — E8881 Metabolic syndrome: Secondary | ICD-10-CM | POA: Diagnosis not present

## 2022-03-09 DIAGNOSIS — E669 Obesity, unspecified: Secondary | ICD-10-CM | POA: Diagnosis not present

## 2022-03-09 DIAGNOSIS — I480 Paroxysmal atrial fibrillation: Secondary | ICD-10-CM | POA: Diagnosis not present

## 2022-03-09 DIAGNOSIS — N179 Acute kidney failure, unspecified: Secondary | ICD-10-CM | POA: Diagnosis not present

## 2022-03-09 DIAGNOSIS — I5043 Acute on chronic combined systolic (congestive) and diastolic (congestive) heart failure: Secondary | ICD-10-CM

## 2022-03-09 DIAGNOSIS — I130 Hypertensive heart and chronic kidney disease with heart failure and stage 1 through stage 4 chronic kidney disease, or unspecified chronic kidney disease: Secondary | ICD-10-CM | POA: Diagnosis not present

## 2022-03-09 DIAGNOSIS — E785 Hyperlipidemia, unspecified: Secondary | ICD-10-CM

## 2022-03-09 LAB — CBC WITH DIFFERENTIAL/PLATELET
Basophils Absolute: 0 10*3/uL (ref 0.0–0.2)
Basos: 0 %
EOS (ABSOLUTE): 0.1 10*3/uL (ref 0.0–0.4)
Eos: 1 %
Hematocrit: 40 % (ref 37.5–51.0)
Hemoglobin: 13.6 g/dL (ref 13.0–17.7)
Immature Grans (Abs): 0.1 10*3/uL (ref 0.0–0.1)
Immature Granulocytes: 1 %
Lymphocytes Absolute: 1.5 10*3/uL (ref 0.7–3.1)
Lymphs: 14 %
MCH: 30.6 pg (ref 26.6–33.0)
MCHC: 34 g/dL (ref 31.5–35.7)
MCV: 90 fL (ref 79–97)
Monocytes Absolute: 1 10*3/uL — ABNORMAL HIGH (ref 0.1–0.9)
Monocytes: 9 %
Neutrophils Absolute: 8.3 10*3/uL — ABNORMAL HIGH (ref 1.4–7.0)
Neutrophils: 75 %
Platelets: 285 10*3/uL (ref 150–450)
RBC: 4.44 x10E6/uL (ref 4.14–5.80)
RDW: 11.7 % (ref 11.6–15.4)
WBC: 11 10*3/uL — ABNORMAL HIGH (ref 3.4–10.8)

## 2022-03-09 LAB — CMP14+EGFR
ALT: 19 IU/L (ref 0–44)
AST: 22 IU/L (ref 0–40)
Albumin/Globulin Ratio: 1.3 (ref 1.2–2.2)
Albumin: 3.5 g/dL — ABNORMAL LOW (ref 3.8–4.8)
Alkaline Phosphatase: 96 IU/L (ref 44–121)
BUN/Creatinine Ratio: 19 (ref 10–24)
BUN: 25 mg/dL (ref 8–27)
Bilirubin Total: 0.9 mg/dL (ref 0.0–1.2)
CO2: 29 mmol/L (ref 20–29)
Calcium: 9.3 mg/dL (ref 8.6–10.2)
Chloride: 93 mmol/L — ABNORMAL LOW (ref 96–106)
Creatinine, Ser: 1.33 mg/dL — ABNORMAL HIGH (ref 0.76–1.27)
Globulin, Total: 2.8 g/dL (ref 1.5–4.5)
Glucose: 121 mg/dL — ABNORMAL HIGH (ref 70–99)
Potassium: 3.6 mmol/L (ref 3.5–5.2)
Sodium: 137 mmol/L (ref 134–144)
Total Protein: 6.3 g/dL (ref 6.0–8.5)
eGFR: 55 mL/min/{1.73_m2} — ABNORMAL LOW (ref >59)

## 2022-03-13 DIAGNOSIS — E1122 Type 2 diabetes mellitus with diabetic chronic kidney disease: Secondary | ICD-10-CM | POA: Diagnosis not present

## 2022-03-13 DIAGNOSIS — N189 Chronic kidney disease, unspecified: Secondary | ICD-10-CM | POA: Diagnosis not present

## 2022-03-13 DIAGNOSIS — I5043 Acute on chronic combined systolic (congestive) and diastolic (congestive) heart failure: Secondary | ICD-10-CM | POA: Diagnosis not present

## 2022-03-13 DIAGNOSIS — I130 Hypertensive heart and chronic kidney disease with heart failure and stage 1 through stage 4 chronic kidney disease, or unspecified chronic kidney disease: Secondary | ICD-10-CM | POA: Diagnosis not present

## 2022-03-13 DIAGNOSIS — N179 Acute kidney failure, unspecified: Secondary | ICD-10-CM | POA: Diagnosis not present

## 2022-03-13 DIAGNOSIS — L03116 Cellulitis of left lower limb: Secondary | ICD-10-CM | POA: Diagnosis not present

## 2022-03-15 ENCOUNTER — Ambulatory Visit (INDEPENDENT_AMBULATORY_CARE_PROVIDER_SITE_OTHER): Payer: Medicare Other | Admitting: Family Medicine

## 2022-03-15 ENCOUNTER — Encounter: Payer: Self-pay | Admitting: Family Medicine

## 2022-03-15 VITALS — BP 138/68 | HR 84 | Temp 97.4°F | Ht 68.0 in | Wt 294.0 lb

## 2022-03-15 DIAGNOSIS — L03116 Cellulitis of left lower limb: Secondary | ICD-10-CM | POA: Diagnosis not present

## 2022-03-15 MED ORDER — DOXYCYCLINE HYCLATE 100 MG PO TABS: 100.0000 mg | ORAL_TABLET | Freq: Two times a day (BID) | ORAL | 0 refills | Status: DC

## 2022-03-15 NOTE — Progress Notes (Signed)
BP 138/68   Pulse 84   Temp (!) 97.4 F (36.3 C)   Ht '5\' 8"'$  (1.727 m)   Wt 294 lb (133.4 kg)   SpO2 96%   BMI 44.70 kg/m    Subjective:   Patient ID: Ryan Garner, male    DOB: 1946/06/08, 76 y.o.   MRN: [id number]  HPI: Ryan Garner is a 76 y.o. male presenting on 03/15/2022 for No chief complaint on file.   HPI Patient is coming in for recheck of cellulitis Patient had an Unna boot this last week and has been taking doxycycline and cefdinir.  He is feeling like he is done a lot better.  He kept the boot on until just barely.  He feels like he is doing a lot better.  He just finished antibiotic this morning.  Relevant past medical, surgical, family and social history reviewed and updated as indicated. Interim medical history since our last visit reviewed. Allergies and medications reviewed and updated.  Review of Systems  Constitutional:  Negative for chills and fever.  Cardiovascular:  Positive for leg swelling.  Skin:  Positive for color change. Negative for rash and wound.    Per HPI unless specifically indicated above   Allergies as of 03/15/2022       Reactions   Flecainide    Per patient dizziness, shortness of breath, blurred vision   Rofecoxib Other (See Comments)   (Vioxx)        Medication List        Accurate as of March 15, 2022 10:26 AM. If you have any questions, ask your nurse or doctor.          albuterol 108 (90 Base) MCG/ACT inhaler Commonly known as: VENTOLIN HFA Inhale 2 puffs into the lungs every 6 (six) hours as needed for wheezing or shortness of breath.   apixaban 5 MG Tabs tablet Commonly known as: ELIQUIS Take 1 tablet (5 mg total) by mouth 2 (two) times daily.   atorvastatin 80 MG tablet Commonly known as: LIPITOR Take 1 tablet by mouth once daily   clopidogrel 75 MG tablet Commonly known as: PLAVIX Take 1 tablet (75 mg total) by mouth daily with breakfast.   doxycycline 100 MG tablet Commonly known as:  VIBRA-TABS Take 1 tablet (100 mg total) by mouth 2 (two) times daily. 1 po bid Started by: Worthy Rancher, MD   furosemide 40 MG tablet Commonly known as: LASIX Take 2 tablets by mouth once daily   metoprolol tartrate 50 MG tablet Commonly known as: LOPRESSOR Take 1 tablet (50 mg total) by mouth 2 (two) times daily.   multivitamin with minerals Tabs tablet Take 1 tablet by mouth daily.   nitroGLYCERIN 0.4 MG SL tablet Commonly known as: NITROSTAT Place 1 tablet (0.4 mg total) under the tongue every 5 (five) minutes x 3 doses as needed for chest pain.   nystatin powder Commonly known as: MYCOSTATIN/NYSTOP APPLY  POWDER TOPICALLY TO AFFECTED AREA TWICE DAILY What changed: See the new instructions.   potassium chloride SA 20 MEQ tablet Commonly known as: Klor-Con M20 Take 40 meq on Monday,Wednesday,Friday, take 20 meq all other days   Prostate Support 300-15 MG Tabs Take 1 tablet by mouth daily. Super Beta Prostate   Spiriva Respimat 1.25 MCG/ACT Aers Generic drug: Tiotropium Bromide Monohydrate Inhale 2 puffs into the lungs daily.   traMADol 50 MG tablet Commonly known as: ULTRAM Take 1 tablet (50 mg total) by mouth 2 (two) times  daily as needed.         Objective:   BP 138/68   Pulse 84   Temp (!) 97.4 F (36.3 C)   Ht '5\' 8"'$  (1.727 m)   Wt 294 lb (133.4 kg)   SpO2 96%   BMI 44.70 kg/m   Wt Readings from Last 3 Encounters:  03/15/22 294 lb (133.4 kg)  03/08/22 288 lb (130.6 kg)  03/02/22 296 lb (134.3 kg)    Physical Exam Vitals and nursing note reviewed.  Constitutional:      Appearance: Normal appearance.  Musculoskeletal:        General: Swelling present.  Skin:    Findings: Erythema (Patient only has erythema now on 1 small spot on his left upper inner lower leg with firm induration but he says that its been there from scarring for some time but the rest of the legs look a lot skinnier, still have swelling but no erythema on the rest) present.  No lesion or rash.  Neurological:     Mental Status: He is alert.     Nurse to replace Unna boots  Assessment & Plan:   Problem List Items Addressed This Visit       Other   Cellulitis of left lower extremity - Primary   Relevant Medications   doxycycline (VIBRA-TABS) 100 MG tablet    We will give 1 more week of doxycycline to replace Unna boots and have him follow-up. Follow up plan: Return in about 1 week (around 03/22/2022), or if symptoms worsen or fail to improve, for Unna boot and cellulitis recheck.  Counseling provided for all of the vaccine components No orders of the defined types were placed in this encounter.   Caryl Pina, MD [facility] 03/15/2022, 10:26 AM

## 2022-03-20 DIAGNOSIS — E1122 Type 2 diabetes mellitus with diabetic chronic kidney disease: Secondary | ICD-10-CM | POA: Diagnosis not present

## 2022-03-20 DIAGNOSIS — N179 Acute kidney failure, unspecified: Secondary | ICD-10-CM | POA: Diagnosis not present

## 2022-03-20 DIAGNOSIS — I130 Hypertensive heart and chronic kidney disease with heart failure and stage 1 through stage 4 chronic kidney disease, or unspecified chronic kidney disease: Secondary | ICD-10-CM | POA: Diagnosis not present

## 2022-03-20 DIAGNOSIS — I5043 Acute on chronic combined systolic (congestive) and diastolic (congestive) heart failure: Secondary | ICD-10-CM | POA: Diagnosis not present

## 2022-03-20 DIAGNOSIS — N189 Chronic kidney disease, unspecified: Secondary | ICD-10-CM | POA: Diagnosis not present

## 2022-03-20 DIAGNOSIS — L03116 Cellulitis of left lower limb: Secondary | ICD-10-CM | POA: Diagnosis not present

## 2022-03-21 ENCOUNTER — Ambulatory Visit (INDEPENDENT_AMBULATORY_CARE_PROVIDER_SITE_OTHER): Payer: Medicare Other | Admitting: Family Medicine

## 2022-03-21 ENCOUNTER — Encounter: Payer: Self-pay | Admitting: Family Medicine

## 2022-03-21 VITALS — BP 116/69 | HR 89 | Temp 98.1°F | Ht 68.0 in | Wt 295.5 lb

## 2022-03-21 DIAGNOSIS — L03116 Cellulitis of left lower limb: Secondary | ICD-10-CM

## 2022-03-21 DIAGNOSIS — L02416 Cutaneous abscess of left lower limb: Secondary | ICD-10-CM | POA: Diagnosis not present

## 2022-03-21 MED ORDER — CEPHALEXIN 500 MG PO CAPS: 500.0000 mg | ORAL_CAPSULE | Freq: Four times a day (QID) | ORAL | 0 refills | Status: AC

## 2022-03-21 MED ORDER — CEFTRIAXONE SODIUM 1 G IJ SOLR
1.0000 g | Freq: Once | INTRAMUSCULAR | Status: AC
  Administered 2022-03-21: 1 g via INTRAMUSCULAR

## 2022-03-21 NOTE — Progress Notes (Signed)
Acute Office Visit  Subjective:     Patient ID: Ryan Garner, male    DOB: February 19, 1946, 76 y.o.   MRN: [id number]  Chief Complaint  Patient presents with   Edema    HPI Patient is in today for follow up of cellulitis of his left lower leg. He was initially seen in the ER for this on 03/01/22. He was admitted and given rocephin IV. He was discharged on doxycyline. He had a follow up visit with his PCP on 03/08/22. Bilateral unna boots were placed at this appt for increased swelling, warmth, and tenderness. He followed up on 03/15/22 with his PCP. He was noted to have a area with firm induration of the left lower leg. Swelling had improved. Both legs were rewrapped with unna boots. And he was prescribed another round of doxycyline. He appt to follow up with his PCP was scheduled for tomorrow however he had so much more pain in his left leg that he had the home health nurse cut the top of the left unna boot. There has been some bloody drainage. He will complete the doxycyline tomorrow.   ROS As per HPI.      Objective:    BP 116/69   Pulse 89   Temp 98.1 F (36.7 C) (Temporal)   Ht '5\' 8"'$  (1.727 m)   Wt 295 lb 8 oz (134 kg)   SpO2 99%   BMI 44.93 kg/m    Physical Exam Vitals and nursing note reviewed.  Constitutional:      General: He is not in acute distress.    Appearance: He is not ill-appearing, toxic-appearing or diaphoretic.  Pulmonary:     Effort: Pulmonary effort is normal. No respiratory distress.  Musculoskeletal:     Right lower leg: 2+ Edema present.     Left lower leg: 2+ Pitting Edema present.  Skin:    Comments: Abscess to left lower medial leg, just below knee. Area of induration of 10 cm x 6 cm with raised erythematous center of 2.5 cm x 2 cm. Bloody drainage present.  Warm the erythema extends from left mid thigh to mid calf.  No warmth or erythema to right leg.   Neurological:     General: No focal deficit present.     Mental Status: He is alert and  oriented to person, place, and time.  Psychiatric:        Mood and Affect: Mood normal.        Behavior: Behavior normal.     No results found for any visits on 03/21/22.      Assessment & Plan:   [place] was seen today for edema.  Diagnoses and all orders for this visit:  Cellulitis of left lower extremity Abscess of left lower leg -     cephALEXin (KEFLEX) 500 MG capsule; Take 1 capsule (500 mg total) by mouth 4 (four) times daily for 7 days. Start tomorrow -     cefTRIAXone (ROCEPHIN) injection 1 g  Reviewed ER records and notes for visits with PCP on 03/08/22 and 03/15/22. Worsening cellulitis with new abscess of left lower leg. Discussed case with patient's PCP. PCP probed abscess with cotton tipped applicator. Rocephin injection today in office. Start keflex tomorrow. He will apply compression sock to right leg. Elevate left leg, warm compresses to abscess. Will follow up with PCP in 2 days, sooner for new or worsening symptoms.   The patient indicates understanding of these issues and agrees with the plan.  Gwenlyn Perking, FNP

## 2022-03-22 ENCOUNTER — Ambulatory Visit: Payer: Medicare Other | Admitting: Family Medicine

## 2022-03-23 ENCOUNTER — Other Ambulatory Visit: Payer: Self-pay | Admitting: Internal Medicine

## 2022-03-23 ENCOUNTER — Encounter: Payer: Self-pay | Admitting: Family Medicine

## 2022-03-23 ENCOUNTER — Ambulatory Visit (INDEPENDENT_AMBULATORY_CARE_PROVIDER_SITE_OTHER): Payer: Medicare Other | Admitting: Family Medicine

## 2022-03-23 VITALS — BP 108/76 | HR 89 | Temp 98.5°F | Ht 68.0 in | Wt 290.0 lb

## 2022-03-23 DIAGNOSIS — L02416 Cutaneous abscess of left lower limb: Secondary | ICD-10-CM

## 2022-03-23 DIAGNOSIS — L03116 Cellulitis of left lower limb: Secondary | ICD-10-CM

## 2022-03-23 MED ORDER — SULFAMETHOXAZOLE-TRIMETHOPRIM 800-160 MG PO TABS: 1.0000 | ORAL_TABLET | Freq: Two times a day (BID) | ORAL | 0 refills | Status: DC

## 2022-03-23 NOTE — Progress Notes (Signed)
BP 108/76   Pulse 89   Temp 98.5 F (36.9 C)   Ht '5\' 8"'$  (1.727 m)   Wt 290 lb (131.5 kg)   SpO2 94%   BMI 44.09 kg/m    Subjective:   Patient ID: Ryan Garner, male    DOB: 05-Aug-1945, 76 y.o.   MRN: [id number]  HPI: Ryan Garner is a 76 y.o. male presenting on 03/23/2022 for Medical Management of Chronic Issues and Cellulitis (LLE, unna boot)   HPI Left lower extremity cellulitis and drainage and wound Patient is coming in for left lower extremity cellulitis.  It has worsened.  Starting it more redness down the leg and he is having significant drainage from the abscess which is good.  The drainage is more serosanguineous and not as purulent as it was initially but the erythema is spreading down his leg.  Relevant past medical, surgical, family and social history reviewed and updated as indicated. Interim medical history since our last visit reviewed. Allergies and medications reviewed and updated.  Review of Systems  Constitutional:  Negative for chills and fever.  Cardiovascular:  Negative for chest pain and leg swelling.  Musculoskeletal:  Positive for gait problem.  Skin:  Positive for color change and wound. Negative for rash.  All other systems reviewed and are negative.   Per HPI unless specifically indicated above   Allergies as of 03/23/2022       Reactions   Flecainide    Per patient dizziness, shortness of breath, blurred vision   Rofecoxib Other (See Comments)   (Vioxx)        Medication List        Accurate as of March 23, 2022  4:43 PM. If you have any questions, ask your nurse or doctor.          STOP taking these medications    doxycycline 100 MG tablet Commonly known as: VIBRA-TABS Stopped by: [name] [name] Kelnhofer, MD       TAKE these medications    albuterol 108 (90 Base) MCG/ACT inhaler Commonly known as: VENTOLIN HFA Inhale 2 puffs into the lungs every 6 (six) hours as needed for wheezing or shortness of breath.   apixaban  5 MG Tabs tablet Commonly known as: ELIQUIS Take 1 tablet (5 mg total) by mouth 2 (two) times daily.   atorvastatin 80 MG tablet Commonly known as: LIPITOR Take 1 tablet by mouth once daily   cephALEXin 500 MG capsule Commonly known as: KEFLEX Take 1 capsule (500 mg total) by mouth 4 (four) times daily for 7 days. Start tomorrow   clopidogrel 75 MG tablet Commonly known as: PLAVIX Take 1 tablet (75 mg total) by mouth daily with breakfast.   furosemide 40 MG tablet Commonly known as: LASIX Take 2 tablets by mouth once daily   metoprolol tartrate 50 MG tablet Commonly known as: LOPRESSOR Take 1 tablet (50 mg total) by mouth 2 (two) times daily.   multivitamin with minerals Tabs tablet Take 1 tablet by mouth daily.   nitroGLYCERIN 0.4 MG SL tablet Commonly known as: NITROSTAT Place 1 tablet (0.4 mg total) under the tongue every 5 (five) minutes x 3 doses as needed for chest pain.   nystatin powder Commonly known as: MYCOSTATIN/NYSTOP APPLY  POWDER TOPICALLY TO AFFECTED AREA TWICE DAILY What changed: See the new instructions.   potassium chloride SA 20 MEQ tablet Commonly known as: Klor-Con M20 Take 40 meq on Monday,Wednesday,Friday, take 20 meq all other days  Prostate Support 300-15 MG Tabs Take 1 tablet by mouth daily. Super Beta Prostate   Spiriva Respimat 1.25 MCG/ACT Aers Generic drug: Tiotropium Bromide Monohydrate Inhale 2 puffs into the lungs daily.   sulfamethoxazole-trimethoprim 800-160 MG tablet Commonly known as: BACTRIM DS Take 1 tablet by mouth 2 (two) times daily. Started by: Worthy Rancher, MD   traMADol 50 MG tablet Commonly known as: ULTRAM Take 1 tablet (50 mg total) by mouth 2 (two) times daily as needed.         Objective:   BP 108/76   Pulse 89   Temp 98.5 F (36.9 C)   Ht '5\' 8"'$  (1.727 m)   Wt 290 lb (131.5 kg)   SpO2 94%   BMI 44.09 kg/m   Wt Readings from Last 3 Encounters:  03/23/22 290 lb (131.5 kg)  03/21/22 295 lb  8 oz (134 kg)  03/15/22 294 lb (133.4 kg)    Physical Exam Vitals and nursing note reviewed.  Constitutional:      Appearance: Normal appearance.  Musculoskeletal:        General: Swelling present.  Skin:    Findings: Erythema, lesion and wound present.  Neurological:     Mental Status: He is alert.    We will wrap with Ace wrap giving compression all the way from the toes up to just below the wound on the lower extremity and then wrapped tightly Coban and gauze over the wound itself overlapping the Coban with compression on the Ace bandage as well.  Instructed patient that he should be mostly just changing the Coban with the wrap once or twice a day as it soaks through.   Assessment & Plan:   Problem List Items Addressed This Visit       Other   Cellulitis of left lower extremity - Primary   Relevant Medications   sulfamethoxazole-trimethoprim (BACTRIM DS) 800-160 MG tablet   Other Relevant Orders   Ambulatory referral to [facility]   Other Visit Diagnoses     Abscess of left lower leg       Relevant Medications   sulfamethoxazole-trimethoprim (BACTRIM DS) 800-160 MG tablet   Other Relevant Orders   Ambulatory referral to [place]     he needs to keep compression on this.  Switch antibiotic to Bactrim, finish the doxycycline out but then start taking the Bactrim as well.  Follow up plan: Return if symptoms worsen or fail to improve, for Scheduled appointments twice weekly here in the office as we can for next week and the week after..  Counseling provided for all of the vaccine components Orders Placed This Encounter  Procedures   Ambulatory referral to [place], MD [facility] 03/23/2022, 4:43 PM

## 2022-03-27 ENCOUNTER — Ambulatory Visit (INDEPENDENT_AMBULATORY_CARE_PROVIDER_SITE_OTHER): Payer: Medicare Other | Admitting: Nurse Practitioner

## 2022-03-27 ENCOUNTER — Encounter: Payer: Self-pay | Admitting: Nurse Practitioner

## 2022-03-27 VITALS — BP 147/79 | HR 89 | Temp 97.2°F | Resp 20 | Ht 68.0 in | Wt 299.0 lb

## 2022-03-27 DIAGNOSIS — L02416 Cutaneous abscess of left lower limb: Secondary | ICD-10-CM

## 2022-03-27 NOTE — Progress Notes (Signed)
   Subjective:    Patient ID: Ryan Garner, male    DOB: 20-Sep-1945, 76 y.o.   MRN: [id number]   Chief Complaint: cellulitis  HPI  Patient has been seen 4x since 03/08/22 with  lower ext cellulitis. He was recently o bactrim with unna boots. Here today for recheck.he has had an open wound on his leg for over 3 weeks.    Review of Systems  Constitutional:  Negative for diaphoresis.  Eyes:  Negative for pain.  Respiratory:  Negative for shortness of breath.   Cardiovascular:  Negative for chest pain, palpitations and leg swelling.  Gastrointestinal:  Negative for abdominal pain.  Endocrine: Negative for polydipsia.  Skin:  Negative for rash.  Neurological:  Negative for dizziness, weakness and headaches.  Hematological:  Does not bruise/bleed easily.  All other systems reviewed and are negative.      Objective:   Physical Exam Constitutional:      Appearance: Normal appearance.  Cardiovascular:     Rate and Rhythm: Normal rate and regular rhythm.     Heart sounds: Normal heart sounds.  Pulmonary:     Breath sounds: Normal breath sounds.  Skin:    General: Skin is warm.     Comments: 3cm open draining wound left lower leg  Neurological:     General: No focal deficit present.     Mental Status: He is alert and oriented to person, place, and time.  Psychiatric:        Mood and Affect: Mood normal.        Behavior: Behavior normal.    BP (!) 153/98   Pulse 89   Temp (!) 97.2 F (36.2 C) (Temporal)   Resp 20   Ht '5\' 8"'$  (1.727 m)   Wt 299 lb (135.6 kg)   SpO2 95%   BMI 45.46 kg/m          Assessment & Plan:  Ryan Garner in today with chief complaint of No chief complaint on file.   1. Abscess of left lower leg Keep clean and dry - AMB referral to wound care center    The above assessment and management plan was discussed with the patient. The patient verbalized understanding of and has agreed to the management plan. Patient is aware to call the clinic  if symptoms persist or worsen. Patient is aware when to return to the clinic for a follow-up visit. Patient educated on when it is appropriate to go to the emergency department.   Mary-Margaret Hassell Done, FNP

## 2022-03-27 NOTE — Patient Instructions (Signed)

## 2022-03-28 DIAGNOSIS — I130 Hypertensive heart and chronic kidney disease with heart failure and stage 1 through stage 4 chronic kidney disease, or unspecified chronic kidney disease: Secondary | ICD-10-CM | POA: Diagnosis not present

## 2022-03-28 DIAGNOSIS — L03116 Cellulitis of left lower limb: Secondary | ICD-10-CM | POA: Diagnosis not present

## 2022-03-28 DIAGNOSIS — E1122 Type 2 diabetes mellitus with diabetic chronic kidney disease: Secondary | ICD-10-CM | POA: Diagnosis not present

## 2022-03-28 DIAGNOSIS — N189 Chronic kidney disease, unspecified: Secondary | ICD-10-CM | POA: Diagnosis not present

## 2022-03-28 DIAGNOSIS — I5043 Acute on chronic combined systolic (congestive) and diastolic (congestive) heart failure: Secondary | ICD-10-CM | POA: Diagnosis not present

## 2022-03-28 DIAGNOSIS — N179 Acute kidney failure, unspecified: Secondary | ICD-10-CM | POA: Diagnosis not present

## 2022-03-30 ENCOUNTER — Encounter: Payer: Self-pay | Admitting: Nurse Practitioner

## 2022-03-30 ENCOUNTER — Ambulatory Visit (INDEPENDENT_AMBULATORY_CARE_PROVIDER_SITE_OTHER): Payer: Medicare Other | Admitting: Nurse Practitioner

## 2022-03-30 VITALS — BP 117/79 | HR 91 | Temp 97.5°F | Ht 68.0 in | Wt 302.0 lb

## 2022-03-30 DIAGNOSIS — L02416 Cutaneous abscess of left lower limb: Secondary | ICD-10-CM | POA: Diagnosis not present

## 2022-03-30 NOTE — Progress Notes (Signed)
   Subjective:    Patient ID: Ryan Garner, male    DOB: [birth year], 76 y.o.   MRN: [id number]   Chief Complaint: wound recheck  HPI  Patient has been coming in to see providers 2x a week for several weeks with left lower leg wound which is not healing. A referral has been made to wound care but he has not gotten appointment yet. He still taking bactrim BID. Wound is still open and draining.  Looks like he has appointment with wound care on 04/02/22  Review of Systems  Constitutional:  Negative for diaphoresis.  Eyes:  Negative for pain.  Respiratory:  Negative for shortness of breath.   Cardiovascular:  Negative for chest pain, palpitations and leg swelling.  Gastrointestinal:  Negative for abdominal pain.  Endocrine: Negative for polydipsia.  Skin:  Negative for rash.  Neurological:  Negative for dizziness, weakness and headaches.  Hematological:  Does not bruise/bleed easily.  All other systems reviewed and are negative.      Objective:   Physical Exam Constitutional:      Appearance: Normal appearance. He is obese.  Cardiovascular:     Rate and Rhythm: Normal rate and regular rhythm.     Heart sounds: Normal heart sounds.  Pulmonary:     Effort: Pulmonary effort is normal.     Breath sounds: Normal breath sounds.  Skin:    General: Skin is warm.  Neurological:     General: No focal deficit present.     Mental Status: He is alert and oriented to person, place, and time.  Psychiatric:        Mood and Affect: Mood normal.        Behavior: Behavior normal.    NO change in wound- still draining clear serosanguinous fluid   Wet to dry dressingapplied    Assessment & Plan:   Ryan Garner in today with chief complaint of abcess re check (Left lower leg) and Shortness of Breath (2-3 weeks )   1. Abscess of left lower leg Keep wound clean and dry Continue antibiotics Make sure do nt miss wound care appointment    The above assessment and management plan was  discussed with the patient. The patient verbalized understanding of and has agreed to the management plan. Patient is aware to call the clinic if symptoms persist or worsen. Patient is aware when to return to the clinic for a follow-up visit. Patient educated on when it is appropriate to go to the emergency department.   Mary-Margaret Hassell Done, FNP

## 2022-04-02 ENCOUNTER — Encounter: Payer: Self-pay | Admitting: Orthopedic Surgery

## 2022-04-02 ENCOUNTER — Ambulatory Visit: Payer: Medicare Other | Admitting: Family Medicine

## 2022-04-02 ENCOUNTER — Ambulatory Visit (INDEPENDENT_AMBULATORY_CARE_PROVIDER_SITE_OTHER): Payer: Medicare Other

## 2022-04-02 ENCOUNTER — Ambulatory Visit (INDEPENDENT_AMBULATORY_CARE_PROVIDER_SITE_OTHER): Payer: Medicare Other | Admitting: Orthopedic Surgery

## 2022-04-02 DIAGNOSIS — L02416 Cutaneous abscess of left lower limb: Secondary | ICD-10-CM | POA: Diagnosis not present

## 2022-04-02 DIAGNOSIS — M79605 Pain in left leg: Secondary | ICD-10-CM

## 2022-04-02 NOTE — Progress Notes (Signed)
Office Visit Note   Patient: Ryan Garner           Date of Birth: 1946-01-04           MRN: [id number] Visit Date: 04/02/2022              Requested by: Che[address], [place] [place],  [place] [address] PCP: [name], [name], MD  Chief Complaint  Patient presents with   Left Leg - Wound Check      HPI: Patient is a 76 year old gentleman who is seen for initial evaluation for chronic abscess left proximal leg.  Patient has been on Augmentin and most recently completed a course of sulfamethoxazole trimethoprim.  Patient complains of a large ulcer with drainage.  Patient states the area of injury started a year ago when he had his leg pinned between a bobcat blade and a truck.  Assessment & Plan: Visit Diagnoses:  1. Pain in left leg   2. Abscess of left lower leg     Plan: With the large abscess and the ulcer communicating to bone fillets urgent to proceed with surgical intervention Wednesday we will start IV antibiotics postoperatively plan for repeat debridement on Friday.  We will obtain an MRI scan while in the hospital to evaluate for underlying osteomyelitis.  Follow-Up Instructions: Return in about 2 weeks (around 04/16/2022).   Ortho Exam  Patient is alert, oriented, no adenopathy, well-dressed, normal affect, normal respiratory effort. Examination patient has massive lymphedema both lower extremities.  There is cellulitis and swelling of the proximal medial aspect of the left leg.  The ulcer probes to bone.  There is drainage.  Necrotic ulcer is 2 cm in diameter.  Most recent hemoglobin A1c is 7.0.  Albumin 3.5.  Imaging: XR Tibia/Fibula Left  Result Date: 04/02/2022 2 view radiographs of the left knee shows no evidence of any destructive bony changes of the proximal tibia.  No images are attached to the encounter.  Labs: Lab Results  Component Value Date   HGBA1C 7.0 (H) 12/15/2021   HGBA1C 6.4 (H) 04/28/2021   HGBA1C 6.7 10/27/2020    REPTSTATUS 03/03/2022 FINAL 03/02/2022   CULT  03/02/2022    NO GROWTH Performed at [facility], [place] [address], [place], [place] [address]      Lab Results  Component Value Date   ALBUMIN 3.5 (L) 03/08/2022   ALBUMIN 3.7 03/01/2022   ALBUMIN 4.0 12/15/2021    Lab Results  Component Value Date   MG 1.7 03/03/2022   MG 2.1 03/01/2020   MG 2.2 02/28/2020   Lab Results  Component Value Date   VD25OH 36.7 04/14/2020    No results found for: "PREALBUMIN"    Latest Ref Rng & Units 03/08/2022    4:44 PM 03/03/2022    5:56 AM 03/01/2022   10:00 PM  CBC EXTENDED  WBC 3.4 - 10.8 x10E3/uL 11.0  9.7  13.3   RBC 4.14 - 5.80 x10E6/uL 4.44  4.06  4.58   Hemoglobin 13.0 - 17.7 g/dL 13.6  12.8  14.3   HCT 37.5 - 51.0 % 40.0  38.5  42.6   Platelets 150 - 450 x10E3/uL 285  167  213   NEUT# 1.4 - 7.0 x10E3/uL 8.3   11.9   Lymph# 0.7 - 3.1 x10E3/uL 1.5   0.6      There is no height or weight on file to calculate BMI.  Orders:  Orders Placed This Encounter  Procedures   XR Tibia/Fibula Left   No orders of the defined types were placed in this encounter.    Procedures: No procedures performed  Clinical Data: No additional findings.  ROS:  All other systems negative, except as noted in the HPI. Review of Systems  Objective: Vital Signs: There were no vitals taken for this visit.  Specialty Comments:  No specialty comments available.  PMFS History: Patient Active Problem List   Diagnosis Date Noted   Cellulitis of left lower extremity 03/02/2022   Chronic diastolic CHF (congestive heart failure) ([place]) 03/02/2022   Cellulitis of left leg 03/02/2022   Cellulitis and abscess of leg 03/02/2022   Foot callus 12/15/2021   Other secondary pulmonary hypertension ([facility]) 08/30/2021   COPD (chronic obstructive pulmonary disease) ([place]) 08/30/2021   Coronary artery disease due to type 2 diabetes mellitus ([place]) 07/21/2020   CHF (congestive heart failure) ([place])  07/21/2020   History of ST elevation myocardial infarction (STEMI) 04/30/2020   Atrial fibrillation ([place]) 02/29/2020   Lumbar radiculopathy 03/24/2019   Morbid obesity ([place]) 09/04/2018   Lung nodules 01/17/2017   Ascending aortic aneurysm 01/17/2017   Type 2 diabetes mellitus ([place]) 03/20/2016   Tear of lateral meniscus of right knee 11/19/2014   Hypertension    Hyperlipidemia    Metabolic syndrome    Osteoarthritis of both knees 07/06/2013   Acquired spondylolisthesis 03/06/2013   Degeneration of lumbar intervertebral disc 03/06/2013   Lumbar spondylosis 03/06/2013   Spinal stenosis of lumbar region 03/06/2013   Past Medical History:  Diagnosis Date   A-fib [facility])    Acute on chronic diastolic CHF (congestive heart failure) ([place]) 07/21/2020   Hyperlipidemia    Hypertension    Metabolic syndrome    Prediabetes    STEMI (ST elevation myocardial infarction) ([place])     Family History  Problem Relation Age of Onset   Cancer Mother        lungs   Coronary artery disease Mother    Diabetes Brother    Heart attack Brother    Diabetes Maternal Grandmother    Arthritis Father     Past Surgical History:  Procedure Laterality Date   APPENDECTOMY     CHOLECYSTECTOMY N/A 03/02/2020   Procedure: LAPAROSCOPIC CHOLECYSTECTOMY;  Surgeon: Aviva Signs, MD;  Location: [facility];  Service: General;  Laterality: N/A;   CORONARY STENT INTERVENTION N/A 04/30/2020   Procedure: CORONARY STENT INTERVENTION;  Surgeon: Martinique, Peter M, MD;  Location: [facility];  Service: Cardiovascular;  Laterality: N/A;   CORONARY/GRAFT ACUTE MI REVASCULARIZATION N/A 04/30/2020   Procedure: Coronary/Graft Acute MI Revascularization;  Surgeon: Martinique, Peter M, MD;  Location: [facility];  Service: Cardiovascular;  Laterality: N/A;   Eyelid Surgery Bilateral    HERNIA REPAIR     knee tendons repair     LEFT HEART CATH AND CORONARY ANGIOGRAPHY N/A 04/30/2020   Procedure: LEFT HEART CATH AND CORONARY  ANGIOGRAPHY;  Surgeon: Martinique, Peter M, MD;  Location: [facility];  Service: Cardiovascular;  Laterality: N/A;   ROTATOR CUFF REPAIR Bilateral    TONSILLECTOMY     Social History   Occupational History   Occupation: Theme park manager    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

## 2022-04-03 ENCOUNTER — Encounter (HOSPITAL_COMMUNITY): Payer: Self-pay | Admitting: Orthopedic Surgery

## 2022-04-03 ENCOUNTER — Other Ambulatory Visit: Payer: Self-pay

## 2022-04-03 DIAGNOSIS — N179 Acute kidney failure, unspecified: Secondary | ICD-10-CM | POA: Diagnosis not present

## 2022-04-03 DIAGNOSIS — I5043 Acute on chronic combined systolic (congestive) and diastolic (congestive) heart failure: Secondary | ICD-10-CM | POA: Diagnosis not present

## 2022-04-03 DIAGNOSIS — L03116 Cellulitis of left lower limb: Secondary | ICD-10-CM | POA: Diagnosis not present

## 2022-04-03 DIAGNOSIS — E1122 Type 2 diabetes mellitus with diabetic chronic kidney disease: Secondary | ICD-10-CM | POA: Diagnosis not present

## 2022-04-03 DIAGNOSIS — N189 Chronic kidney disease, unspecified: Secondary | ICD-10-CM | POA: Diagnosis not present

## 2022-04-03 DIAGNOSIS — I130 Hypertensive heart and chronic kidney disease with heart failure and stage 1 through stage 4 chronic kidney disease, or unspecified chronic kidney disease: Secondary | ICD-10-CM | POA: Diagnosis not present

## 2022-04-03 NOTE — Progress Notes (Addendum)
Spoke with pt today for pre-op call. Pt was admitted to the hospital in August for sepsis. Hx of A-fib and is on Plavix and Eliquis. He states Dr. Sharol Given has not instructed him to hold either one and he thought he would hold the Eliquis tonight. I told him I would call Dr. Jess Barters office and find out and would call him back. He denies any recent chest pain. States he is always short of breath. Pt was diagnosed with Pre-diabetes, but in August his A1C was 7.0. Pt states he does not take any medications for diabetes and does not check his blood sugar.   Shower instructions given to pt. Pt voiced understanding.   See Jeneen Rinks' note.  Dr. Jess Barters surgery scheduler said that Dr. Sharol Given said it was ok for him to hold the Eliquis tonight and in the AM and hold Plavix in the AM. I called pt and gave him those instructions. He voiced understanding and appreciation.

## 2022-04-03 NOTE — Anesthesia Preprocedure Evaluation (Addendum)
Anesthesia Evaluation  Patient identified by MRN, date of birth, ID band Patient awake    Reviewed: Allergy & Precautions, NPO status , Patient's Chart, lab work & pertinent test results, reviewed documented beta blocker date and time   History of Anesthesia Complications Negative for: history of anesthetic complications  Airway Mallampati: II  TM Distance: >3 FB Neck ROM: Full    Dental  (+) Missing,    Pulmonary shortness of breath and with exertion, sleep apnea , COPD, former smoker,    Pulmonary exam normal        Cardiovascular hypertension, Pt. on medications and Pt. on home beta blockers + CAD, + Past MI (STEMI 2021), + Cardiac Stents (DES left circumflex 2021) and +CHF  Normal cardiovascular exam+ dysrhythmias Atrial Fibrillation      Neuro/Psych    GI/Hepatic negative GI ROS, Neg liver ROS,   Endo/Other  diabetes, Type 2Morbid obesity (BMI 46)  Renal/GU negative Renal ROS  negative genitourinary   Musculoskeletal   Abdominal   Peds  Hematology negative hematology ROS (+)   Anesthesia Other Findings Abscess Left Proximal Tibia  Reproductive/Obstetrics                           Anesthesia Physical Anesthesia Plan  ASA: 4  Anesthesia Plan: General   Post-op Pain Management: Tylenol PO (pre-op)*   Induction: Intravenous  PONV Risk Score and Plan: 2 and Treatment may vary due to age or medical condition, Dexamethasone and Ondansetron  Airway Management Planned: LMA  Additional Equipment: None  Intra-op Plan:   Post-operative Plan: Extubation in OR  Informed Consent: I have reviewed the patients History and Physical, chart, labs and discussed the procedure including the risks, benefits and alternatives for the proposed anesthesia with the patient or authorized representative who has indicated his/her understanding and acceptance.     Dental advisory given  Plan Discussed  with: CRNA  Anesthesia Plan Comments: (PAT note by Karoline Caldwell, PA-C: Follows with cardiology for history of HTN, HLD, atrial fibrillation, CAD s/p STEMI 04/30/2020 treated with PTCA/DES of left circumflex.  He is previously seen by EP cardiology and felt not a candidate for Tikosyn or amiodarone.  Recommend pursue rate control strategy and anticoagulation.  Last seen by Dr. Harrington Challenger 09/15/2021 diuretic was titrated at that time due to volume overload.  Recent admission 8/10 through 03/05/2022 for left lower extremity cellulitis.  He was seen by Dr. Sharol Given 04/02/2022 and noted to have large abscess and ulcer communicating with bone recommended urgently proceeding with surgical intervention.  CMP and CBC from 03/08/2022 reviewed, creatinine mildly elevated 1.33, otherwise unremarkable.  History of prediabetes, however, most recent A1c 12/15/2021 was 7.0.  Patient will need day of surgery labs and evaluation.  EKG 03/01/2022: Atrial fibrillation.  Rate 95.  Incomplete right bundle branch block and left posterior fascicular block.  Nonspecific T abnormalities, lateral leads.  CT chest 01/01/2022: IMPRESSION: 1. The two right upper lobe pulmonary nodules of concern on previous PET-CT are stable in the interval. No new or progressive interval findings. 2. 4 mm right lower lobe pulmonary nodule stable since CTA chest 01/20/2018 consistent with benign etiology. 3. Tiny right pleural effusion is similar to prior.  TTE 06/02/2021: 1. Left ventricular ejection fraction, by estimation, is 60 to 65%. The  left ventricle has normal function. The left ventricle has no regional  wall motion abnormalities. There is moderate left ventricular hypertrophy.  Left ventricular diastolic  parameters are  indeterminate.  2. Ventricular septum if flattened in systole suggesting RV pressure  overload. . Right ventricular systolic function is mildly reduced. The  right ventricular size is moderately enlarged. There is  severely elevated  pulmonary artery systolic pressure.  3. Left atrial size was moderately dilated.  4. Right atrial size was moderately dilated.  5. The mitral valve is abnormal. Mild to moderate mitral valve  regurgitation. No evidence of mitral stenosis.  6. The tricuspid valve is abnormal. Tricuspid valve regurgitation is  moderate.  7. Mild to moderate aortic stenosis. Lower than expected gradient may be  due to decreased SVI.Marland Kitchen The aortic valve is tricuspid. There is moderate  calcification of the aortic valve. There is moderate thickening of the  aortic valve. Aortic valve  regurgitation is not visualized. Mild to moderate aortic valve stenosis.   Cath and PCI 04/30/2020: . Ramus lesion is 75% stenosed. . Mid Cx lesion is 99% stenosed. Marland Kitchen Post intervention, there is a 0% residual stenosis. . A drug-eluting stent was successfully placed using a STENT RESOLUTE ONYX 2.5X34. . The left ventricular systolic function is normal. . LV end diastolic pressure is mildly elevated. . The left ventricular ejection fraction is 55-65% by visual estimate.  1. 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 Good LV systolic function 3. Elevated LVEDP 4. Successful PCI of the mid to distal LCx with DES x 1.  Plan: ASA 81 mg daily for one month. Plavix 75 mg daily for one year. May resume Eliquis tomorrow. IV diuresis. Will need to stop Flecainide. Consider alternative AAD therapy. )      Anesthesia Quick Evaluation

## 2022-04-03 NOTE — Progress Notes (Signed)
Anesthesia Chart Review: Same day workup  Follows with cardiology for history of HTN, HLD, atrial fibrillation, CAD s/p STEMI 04/30/2020 treated with PTCA/DES of left circumflex.  He is previously seen by EP cardiology and felt not a candidate for Tikosyn or amiodarone.  Recommend pursue rate control strategy and anticoagulation.  Last seen by Dr. Harrington Challenger 09/15/2021 diuretic was titrated at that time due to volume overload.  Recent admission 8/10 through 03/05/2022 for left lower extremity cellulitis.  He was seen by Dr. Sharol Given 04/02/2022 and noted to have large abscess and ulcer communicating with bone recommended urgently proceeding with surgical intervention.  CMP and CBC from 03/08/2022 reviewed, creatinine mildly elevated 1.33, otherwise unremarkable.  History of prediabetes, however, most recent A1c 12/15/2021 was 7.0.  Patient will need day of surgery labs and evaluation.  EKG 03/01/2022: Atrial fibrillation.  Rate 95.  Incomplete right bundle branch block and left posterior fascicular block.  Nonspecific T abnormalities, lateral leads.  CT chest 01/01/2022: IMPRESSION: 1. The two right upper lobe pulmonary nodules of concern on previous PET-CT are stable in the interval. No new or progressive interval findings. 2. 4 mm right lower lobe pulmonary nodule stable since CTA chest 01/20/2018 consistent with benign etiology. 3. Tiny right pleural effusion is similar to prior.   TTE 06/02/2021:  1. Left ventricular ejection fraction, by estimation, is 60 to 65%. The  left ventricle has normal function. The left ventricle has no regional  wall motion abnormalities. There is moderate left ventricular hypertrophy.  Left ventricular diastolic  parameters are indeterminate.   2. Ventricular septum if flattened in systole suggesting RV pressure  overload. . Right ventricular systolic function is mildly reduced. The  right ventricular size is moderately enlarged. There is severely elevated  pulmonary  artery systolic pressure.   3. Left atrial size was moderately dilated.   4. Right atrial size was moderately dilated.   5. The mitral valve is abnormal. Mild to moderate mitral valve  regurgitation. No evidence of mitral stenosis.   6. The tricuspid valve is abnormal. Tricuspid valve regurgitation is  moderate.   7. Mild to moderate aortic stenosis. Lower than expected gradient may be  due to decreased SVI.Marland Kitchen The aortic valve is tricuspid. There is moderate  calcification of the aortic valve. There is moderate thickening of the  aortic valve. Aortic valve  regurgitation is not visualized. Mild to moderate aortic valve stenosis.   Cath and PCI 04/30/2020: Ramus lesion is 75% stenosed. Mid Cx lesion is 99% stenosed. Post intervention, there is a 0% residual stenosis. A drug-eluting stent was successfully placed using a STENT RESOLUTE ONYX 2.5X34. The left ventricular systolic function is normal. LV end diastolic pressure is mildly elevated. The left ventricular ejection fraction is 55-65% by visual estimate.   1. 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 Good LV systolic function 3. Elevated LVEDP 4. Successful PCI of the mid to distal LCx with DES x 1.   Plan: ASA 81 mg daily for one month. Plavix 75 mg daily for one year. May resume Eliquis tomorrow. IV diuresis. Will need to stop Flecainide. Consider alternative AAD therapy.     Wynonia Musty [facility] Short Stay Center/Anesthesiology Phone [phone] 04/03/2022 2:56 PM

## 2022-04-04 ENCOUNTER — Inpatient Hospital Stay (HOSPITAL_COMMUNITY): Payer: Medicare Other

## 2022-04-04 ENCOUNTER — Inpatient Hospital Stay (HOSPITAL_COMMUNITY): Payer: Medicare Other | Admitting: Physician Assistant

## 2022-04-04 ENCOUNTER — Encounter (HOSPITAL_COMMUNITY)
Admission: RE | Disposition: A | Discharge status: Skilled Nursing Facility | Payer: Self-pay | Source: Home / Self Care | Attending: Orthopedic Surgery

## 2022-04-04 ENCOUNTER — Other Ambulatory Visit: Payer: Self-pay

## 2022-04-04 ENCOUNTER — Inpatient Hospital Stay (HOSPITAL_COMMUNITY)
Admission: RE | Admit: 2022-04-04 | Discharge: 2022-04-14 | DRG: 573 | Disposition: A | Discharge status: Skilled Nursing Facility | Payer: Medicare Other | Attending: Orthopedic Surgery | Admitting: Orthopedic Surgery

## 2022-04-04 ENCOUNTER — Encounter (HOSPITAL_COMMUNITY): Payer: Self-pay | Admitting: Orthopedic Surgery

## 2022-04-04 DIAGNOSIS — N179 Acute kidney failure, unspecified: Secondary | ICD-10-CM | POA: Diagnosis present

## 2022-04-04 DIAGNOSIS — Z7901 Long term (current) use of anticoagulants: Secondary | ICD-10-CM

## 2022-04-04 DIAGNOSIS — E8881 Metabolic syndrome: Secondary | ICD-10-CM | POA: Diagnosis present

## 2022-04-04 DIAGNOSIS — J449 Chronic obstructive pulmonary disease, unspecified: Secondary | ICD-10-CM | POA: Diagnosis present

## 2022-04-04 DIAGNOSIS — I4891 Unspecified atrial fibrillation: Secondary | ICD-10-CM | POA: Diagnosis not present

## 2022-04-04 DIAGNOSIS — R52 Pain, unspecified: Secondary | ICD-10-CM | POA: Diagnosis not present

## 2022-04-04 DIAGNOSIS — E119 Type 2 diabetes mellitus without complications: Secondary | ICD-10-CM

## 2022-04-04 DIAGNOSIS — I5043 Acute on chronic combined systolic (congestive) and diastolic (congestive) heart failure: Secondary | ICD-10-CM | POA: Diagnosis not present

## 2022-04-04 DIAGNOSIS — Z6841 Body Mass Index (BMI) 40.0 and over, adult: Secondary | ICD-10-CM | POA: Diagnosis not present

## 2022-04-04 DIAGNOSIS — Z801 Family history of malignant neoplasm of trachea, bronchus and lung: Secondary | ICD-10-CM

## 2022-04-04 DIAGNOSIS — I97711 Intraoperative cardiac arrest during other surgery: Secondary | ICD-10-CM | POA: Diagnosis not present

## 2022-04-04 DIAGNOSIS — I9581 Postprocedural hypotension: Secondary | ICD-10-CM | POA: Diagnosis not present

## 2022-04-04 DIAGNOSIS — I452 Bifascicular block: Secondary | ICD-10-CM | POA: Diagnosis present

## 2022-04-04 DIAGNOSIS — D631 Anemia in chronic kidney disease: Secondary | ICD-10-CM | POA: Diagnosis present

## 2022-04-04 DIAGNOSIS — E871 Hypo-osmolality and hyponatremia: Secondary | ICD-10-CM | POA: Diagnosis present

## 2022-04-04 DIAGNOSIS — I5033 Acute on chronic diastolic (congestive) heart failure: Secondary | ICD-10-CM | POA: Diagnosis not present

## 2022-04-04 DIAGNOSIS — Z87891 Personal history of nicotine dependence: Secondary | ICD-10-CM | POA: Diagnosis not present

## 2022-04-04 DIAGNOSIS — Z79899 Other long term (current) drug therapy: Secondary | ICD-10-CM

## 2022-04-04 DIAGNOSIS — Z743 Need for continuous supervision: Secondary | ICD-10-CM | POA: Diagnosis not present

## 2022-04-04 DIAGNOSIS — I130 Hypertensive heart and chronic kidney disease with heart failure and stage 1 through stage 4 chronic kidney disease, or unspecified chronic kidney disease: Secondary | ICD-10-CM | POA: Diagnosis present

## 2022-04-04 DIAGNOSIS — R339 Retention of urine, unspecified: Secondary | ICD-10-CM | POA: Diagnosis not present

## 2022-04-04 DIAGNOSIS — I480 Paroxysmal atrial fibrillation: Secondary | ICD-10-CM | POA: Diagnosis not present

## 2022-04-04 DIAGNOSIS — N1832 Chronic kidney disease, stage 3b: Secondary | ICD-10-CM | POA: Diagnosis present

## 2022-04-04 DIAGNOSIS — G4730 Sleep apnea, unspecified: Secondary | ICD-10-CM | POA: Diagnosis present

## 2022-04-04 DIAGNOSIS — E785 Hyperlipidemia, unspecified: Secondary | ICD-10-CM | POA: Diagnosis present

## 2022-04-04 DIAGNOSIS — Z955 Presence of coronary angioplasty implant and graft: Secondary | ICD-10-CM

## 2022-04-04 DIAGNOSIS — Z8261 Family history of arthritis: Secondary | ICD-10-CM

## 2022-04-04 DIAGNOSIS — I482 Chronic atrial fibrillation, unspecified: Secondary | ICD-10-CM | POA: Diagnosis not present

## 2022-04-04 DIAGNOSIS — I340 Nonrheumatic mitral (valve) insufficiency: Secondary | ICD-10-CM | POA: Diagnosis not present

## 2022-04-04 DIAGNOSIS — Z888 Allergy status to other drugs, medicaments and biological substances status: Secondary | ICD-10-CM

## 2022-04-04 DIAGNOSIS — N189 Chronic kidney disease, unspecified: Secondary | ICD-10-CM | POA: Diagnosis not present

## 2022-04-04 DIAGNOSIS — I4819 Other persistent atrial fibrillation: Secondary | ICD-10-CM | POA: Diagnosis present

## 2022-04-04 DIAGNOSIS — E1122 Type 2 diabetes mellitus with diabetic chronic kidney disease: Secondary | ICD-10-CM | POA: Diagnosis present

## 2022-04-04 DIAGNOSIS — R4182 Altered mental status, unspecified: Secondary | ICD-10-CM | POA: Diagnosis not present

## 2022-04-04 DIAGNOSIS — Z48817 Encounter for surgical aftercare following surgery on the skin and subcutaneous tissue: Secondary | ICD-10-CM | POA: Diagnosis not present

## 2022-04-04 DIAGNOSIS — R600 Localized edema: Secondary | ICD-10-CM | POA: Diagnosis not present

## 2022-04-04 DIAGNOSIS — I2510 Atherosclerotic heart disease of native coronary artery without angina pectoris: Secondary | ICD-10-CM | POA: Diagnosis present

## 2022-04-04 DIAGNOSIS — E876 Hypokalemia: Secondary | ICD-10-CM | POA: Diagnosis present

## 2022-04-04 DIAGNOSIS — S81802A Unspecified open wound, left lower leg, initial encounter: Secondary | ICD-10-CM | POA: Diagnosis not present

## 2022-04-04 DIAGNOSIS — I517 Cardiomegaly: Secondary | ICD-10-CM | POA: Diagnosis not present

## 2022-04-04 DIAGNOSIS — R079 Chest pain, unspecified: Secondary | ICD-10-CM | POA: Diagnosis not present

## 2022-04-04 DIAGNOSIS — I272 Pulmonary hypertension, unspecified: Secondary | ICD-10-CM | POA: Diagnosis not present

## 2022-04-04 DIAGNOSIS — E875 Hyperkalemia: Secondary | ICD-10-CM | POA: Diagnosis present

## 2022-04-04 DIAGNOSIS — D649 Anemia, unspecified: Secondary | ICD-10-CM | POA: Diagnosis not present

## 2022-04-04 DIAGNOSIS — M62562 Muscle wasting and atrophy, not elsewhere classified, left lower leg: Secondary | ICD-10-CM | POA: Diagnosis not present

## 2022-04-04 DIAGNOSIS — I110 Hypertensive heart disease with heart failure: Secondary | ICD-10-CM | POA: Diagnosis not present

## 2022-04-04 DIAGNOSIS — I5030 Unspecified diastolic (congestive) heart failure: Secondary | ICD-10-CM | POA: Diagnosis present

## 2022-04-04 DIAGNOSIS — L02416 Cutaneous abscess of left lower limb: Secondary | ICD-10-CM

## 2022-04-04 DIAGNOSIS — I509 Heart failure, unspecified: Secondary | ICD-10-CM | POA: Diagnosis not present

## 2022-04-04 DIAGNOSIS — I4821 Permanent atrial fibrillation: Secondary | ICD-10-CM | POA: Diagnosis present

## 2022-04-04 DIAGNOSIS — L03116 Cellulitis of left lower limb: Secondary | ICD-10-CM | POA: Diagnosis present

## 2022-04-04 DIAGNOSIS — L089 Local infection of the skin and subcutaneous tissue, unspecified: Secondary | ICD-10-CM | POA: Diagnosis not present

## 2022-04-04 DIAGNOSIS — I350 Nonrheumatic aortic (valve) stenosis: Secondary | ICD-10-CM

## 2022-04-04 DIAGNOSIS — I2721 Secondary pulmonary arterial hypertension: Secondary | ICD-10-CM | POA: Diagnosis not present

## 2022-04-04 DIAGNOSIS — I252 Old myocardial infarction: Secondary | ICD-10-CM

## 2022-04-04 DIAGNOSIS — R001 Bradycardia, unspecified: Secondary | ICD-10-CM | POA: Diagnosis not present

## 2022-04-04 DIAGNOSIS — Z8249 Family history of ischemic heart disease and other diseases of the circulatory system: Secondary | ICD-10-CM

## 2022-04-04 DIAGNOSIS — Z833 Family history of diabetes mellitus: Secondary | ICD-10-CM

## 2022-04-04 DIAGNOSIS — R5383 Other fatigue: Secondary | ICD-10-CM | POA: Diagnosis not present

## 2022-04-04 DIAGNOSIS — N1831 Chronic kidney disease, stage 3a: Secondary | ICD-10-CM | POA: Diagnosis not present

## 2022-04-04 DIAGNOSIS — I25119 Atherosclerotic heart disease of native coronary artery with unspecified angina pectoris: Secondary | ICD-10-CM | POA: Diagnosis not present

## 2022-04-04 DIAGNOSIS — R5381 Other malaise: Secondary | ICD-10-CM | POA: Diagnosis not present

## 2022-04-04 DIAGNOSIS — I469 Cardiac arrest, cause unspecified: Secondary | ICD-10-CM | POA: Diagnosis not present

## 2022-04-04 DIAGNOSIS — R918 Other nonspecific abnormal finding of lung field: Secondary | ICD-10-CM | POA: Diagnosis not present

## 2022-04-04 DIAGNOSIS — I959 Hypotension, unspecified: Secondary | ICD-10-CM | POA: Diagnosis not present

## 2022-04-04 DIAGNOSIS — E669 Obesity, unspecified: Secondary | ICD-10-CM | POA: Diagnosis not present

## 2022-04-04 DIAGNOSIS — R0602 Shortness of breath: Secondary | ICD-10-CM | POA: Diagnosis not present

## 2022-04-04 HISTORY — DX: Dyspnea, unspecified: R06.00

## 2022-04-04 HISTORY — PX: I & D EXTREMITY: SHX5045

## 2022-04-04 HISTORY — DX: Cardiac arrhythmia, unspecified: I49.9

## 2022-04-04 HISTORY — DX: Atherosclerotic heart disease of native coronary artery without angina pectoris: I25.10

## 2022-04-04 HISTORY — DX: Type 2 diabetes mellitus without complications: E11.9

## 2022-04-04 HISTORY — DX: Sleep apnea, unspecified: G47.30

## 2022-04-04 LAB — GLUCOSE, CAPILLARY
Glucose-Capillary: 118 mg/dL — ABNORMAL HIGH (ref 70–99)
Glucose-Capillary: 114 mg/dL — ABNORMAL HIGH (ref 70–99)
Glucose-Capillary: 106 mg/dL — ABNORMAL HIGH (ref 70–99)

## 2022-04-04 LAB — BASIC METABOLIC PANEL
Anion gap: 9 (ref 5–15)
BUN: 32 mg/dL — ABNORMAL HIGH (ref 8–23)
CO2: 23 mmol/L (ref 22–32)
Calcium: 8.8 mg/dL — ABNORMAL LOW (ref 8.9–10.3)
Chloride: 102 mmol/L (ref 98–111)
Creatinine, Ser: 1.71 mg/dL — ABNORMAL HIGH (ref 0.61–1.24)
GFR, Estimated: 40 mL/min — ABNORMAL LOW (ref >60)
Glucose, Bld: 147 mg/dL — ABNORMAL HIGH (ref 70–99)
Potassium: 4.7 mmol/L (ref 3.5–5.1)
Sodium: 134 mmol/L — ABNORMAL LOW (ref 135–145)

## 2022-04-04 SURGERY — IRRIGATION AND DEBRIDEMENT EXTREMITY
Anesthesia: General | Site: Leg Lower | Laterality: Left

## 2022-04-04 MED ORDER — FENTANYL CITRATE (PF) 100 MCG/2ML IJ SOLN
25.0000 ug | INTRAMUSCULAR | Status: DC | PRN
  Administered 2022-04-04 (×3): 50 ug via INTRAVENOUS

## 2022-04-04 MED ORDER — PROPOFOL 10 MG/ML IV BOLUS
INTRAVENOUS | Status: DC | PRN
  Administered 2022-04-04: 170 mg via INTRAVENOUS

## 2022-04-04 MED ORDER — FENTANYL CITRATE (PF) 100 MCG/2ML IJ SOLN
INTRAMUSCULAR | Status: AC
  Filled 2022-04-04: qty 2

## 2022-04-04 MED ORDER — ZINC SULFATE 220 (50 ZN) MG PO CAPS
220.0000 mg | ORAL_CAPSULE | Freq: Every day | ORAL | Status: DC
  Administered 2022-04-04 – 2022-04-14 (×10): 220 mg via ORAL
  Filled 2022-04-04 (×11): qty 1

## 2022-04-04 MED ORDER — HYDRALAZINE HCL 20 MG/ML IJ SOLN: 5.0000 mg | INTRAMUSCULAR | Status: DC | PRN

## 2022-04-04 MED ORDER — HYDROMORPHONE HCL 1 MG/ML IJ SOLN
0.5000 mg | INTRAMUSCULAR | Status: DC | PRN
  Administered 2022-04-04 – 2022-04-06 (×2): 1 mg via INTRAVENOUS
  Filled 2022-04-04 (×2): qty 1

## 2022-04-04 MED ORDER — ACETAMINOPHEN 325 MG PO TABS
325.0000 mg | ORAL_TABLET | Freq: Four times a day (QID) | ORAL | Status: DC | PRN
  Filled 2022-04-04: qty 2

## 2022-04-04 MED ORDER — METOPROLOL TARTRATE 50 MG PO TABS
ORAL_TABLET | ORAL | Status: AC
  Administered 2022-04-04: 50 mg via ORAL
  Filled 2022-04-04: qty 1

## 2022-04-04 MED ORDER — POLYETHYLENE GLYCOL 3350 17 G PO PACK
17.0000 g | PACK | Freq: Every day | ORAL | Status: DC | PRN
  Filled 2022-04-04: qty 1

## 2022-04-04 MED ORDER — OXYCODONE HCL 5 MG PO TABS
5.0000 mg | ORAL_TABLET | Freq: Once | ORAL | Status: AC | PRN
  Administered 2022-04-04: 5 mg via ORAL

## 2022-04-04 MED ORDER — EPHEDRINE 5 MG/ML INJ
INTRAVENOUS | Status: AC
  Filled 2022-04-04: qty 5

## 2022-04-04 MED ORDER — PHENOL 1.4 % MT LIQD: 1.0000 | OROMUCOSAL | Status: DC | PRN

## 2022-04-04 MED ORDER — OXYCODONE HCL 5 MG/5ML PO SOLN: 5.0000 mg | Freq: Once | ORAL | Status: AC | PRN

## 2022-04-04 MED ORDER — VANCOMYCIN HCL 10 G IV SOLR
2500.0000 mg | Freq: Once | INTRAVENOUS | Status: AC
  Administered 2022-04-04: 2500 mg via INTRAVENOUS
  Filled 2022-04-04: qty 2500

## 2022-04-04 MED ORDER — FUROSEMIDE 40 MG PO TABS
80.0000 mg | ORAL_TABLET | Freq: Every day | ORAL | Status: DC
  Administered 2022-04-04: 80 mg via ORAL
  Filled 2022-04-04 (×3): qty 2

## 2022-04-04 MED ORDER — CLOPIDOGREL BISULFATE 75 MG PO TABS
75.0000 mg | ORAL_TABLET | Freq: Every day | ORAL | Status: DC
  Administered 2022-04-06 – 2022-04-14 (×9): 75 mg via ORAL
  Filled 2022-04-04 (×9): qty 1

## 2022-04-04 MED ORDER — SODIUM CHLORIDE 0.9 % IV SOLN: INTRAVENOUS | Status: DC

## 2022-04-04 MED ORDER — ACETAMINOPHEN 500 MG PO TABS
1000.0000 mg | ORAL_TABLET | Freq: Once | ORAL | Status: AC
  Administered 2022-04-04: 1000 mg via ORAL
  Filled 2022-04-04: qty 2

## 2022-04-04 MED ORDER — POTASSIUM CHLORIDE CRYS ER 20 MEQ PO TBCR
40.0000 meq | EXTENDED_RELEASE_TABLET | ORAL | Status: DC
  Administered 2022-04-09 – 2022-04-11 (×2): 40 meq via ORAL
  Filled 2022-04-04 (×4): qty 2

## 2022-04-04 MED ORDER — METOPROLOL TARTRATE 5 MG/5ML IV SOLN: 2.0000 mg | INTRAVENOUS | Status: DC | PRN

## 2022-04-04 MED ORDER — DEXAMETHASONE SODIUM PHOSPHATE 10 MG/ML IJ SOLN
INTRAMUSCULAR | Status: DC | PRN
  Administered 2022-04-04: 5 mg via INTRAVENOUS

## 2022-04-04 MED ORDER — BISACODYL 5 MG PO TBEC: 5.0000 mg | DELAYED_RELEASE_TABLET | Freq: Every day | ORAL | Status: DC | PRN

## 2022-04-04 MED ORDER — ALUM & MAG HYDROXIDE-SIMETH 200-200-20 MG/5ML PO SUSP: 15.0000 mL | ORAL | Status: DC | PRN

## 2022-04-04 MED ORDER — APIXABAN 5 MG PO TABS
5.0000 mg | ORAL_TABLET | Freq: Two times a day (BID) | ORAL | Status: DC
  Administered 2022-04-04 – 2022-04-14 (×17): 5 mg via ORAL
  Filled 2022-04-04 (×20): qty 1

## 2022-04-04 MED ORDER — POTASSIUM CHLORIDE CRYS ER 20 MEQ PO TBCR
20.0000 meq | EXTENDED_RELEASE_TABLET | ORAL | Status: DC
  Administered 2022-04-05 – 2022-04-12 (×5): 20 meq via ORAL
  Filled 2022-04-04 (×6): qty 1

## 2022-04-04 MED ORDER — VITAMIN C 500 MG PO TABS
1000.0000 mg | ORAL_TABLET | Freq: Every day | ORAL | Status: DC
  Administered 2022-04-04 – 2022-04-14 (×10): 1000 mg via ORAL
  Filled 2022-04-04 (×12): qty 2

## 2022-04-04 MED ORDER — MAGNESIUM CITRATE PO SOLN: 1.0000 | Freq: Once | ORAL | Status: DC | PRN

## 2022-04-04 MED ORDER — SODIUM CHLORIDE 0.9 % IV SOLN
2.0000 g | INTRAVENOUS | Status: DC
  Administered 2022-04-04 – 2022-04-09 (×6): 2 g via INTRAVENOUS
  Filled 2022-04-04 (×8): qty 20

## 2022-04-04 MED ORDER — PHENYLEPHRINE 80 MCG/ML (10ML) SYRINGE FOR IV PUSH (FOR BLOOD PRESSURE SUPPORT)
PREFILLED_SYRINGE | INTRAVENOUS | Status: AC
  Filled 2022-04-04: qty 10

## 2022-04-04 MED ORDER — ORAL CARE MOUTH RINSE: 15.0000 mL | Freq: Once | OROMUCOSAL | Status: AC

## 2022-04-04 MED ORDER — CHLORHEXIDINE GLUCONATE 0.12 % MT SOLN: 15.0000 mL | Freq: Once | OROMUCOSAL | Status: AC

## 2022-04-04 MED ORDER — FENTANYL CITRATE (PF) 250 MCG/5ML IJ SOLN
INTRAMUSCULAR | Status: AC
  Filled 2022-04-04: qty 5

## 2022-04-04 MED ORDER — LABETALOL HCL 5 MG/ML IV SOLN: 10.0000 mg | INTRAVENOUS | Status: DC | PRN

## 2022-04-04 MED ORDER — METOPROLOL TARTRATE 50 MG PO TABS: 50.0000 mg | ORAL_TABLET | Freq: Once | ORAL | Status: AC

## 2022-04-04 MED ORDER — PROPOFOL 10 MG/ML IV BOLUS
INTRAVENOUS | Status: AC
  Filled 2022-04-04: qty 20

## 2022-04-04 MED ORDER — INSULIN ASPART 100 UNIT/ML IJ SOLN: 0.0000 [IU] | INTRAMUSCULAR | Status: DC | PRN

## 2022-04-04 MED ORDER — LIDOCAINE 2% (20 MG/ML) 5 ML SYRINGE
INTRAMUSCULAR | Status: AC
  Filled 2022-04-04: qty 5

## 2022-04-04 MED ORDER — PHENYLEPHRINE 80 MCG/ML (10ML) SYRINGE FOR IV PUSH (FOR BLOOD PRESSURE SUPPORT)
PREFILLED_SYRINGE | INTRAVENOUS | Status: DC | PRN
  Administered 2022-04-04: 240 ug via INTRAVENOUS

## 2022-04-04 MED ORDER — DOCUSATE SODIUM 100 MG PO CAPS
100.0000 mg | ORAL_CAPSULE | Freq: Every day | ORAL | Status: DC
  Administered 2022-04-05 – 2022-04-07 (×2): 100 mg via ORAL
  Filled 2022-04-04 (×2): qty 1

## 2022-04-04 MED ORDER — METOPROLOL TARTRATE 50 MG PO TABS
50.0000 mg | ORAL_TABLET | Freq: Two times a day (BID) | ORAL | Status: DC
  Administered 2022-04-04 – 2022-04-06 (×4): 50 mg via ORAL
  Filled 2022-04-04 (×5): qty 1

## 2022-04-04 MED ORDER — VANCOMYCIN HCL 1750 MG/350ML IV SOLN: 1750.0000 mg | INTRAVENOUS | Status: DC

## 2022-04-04 MED ORDER — EPHEDRINE SULFATE-NACL 50-0.9 MG/10ML-% IV SOSY
PREFILLED_SYRINGE | INTRAVENOUS | Status: DC | PRN
  Administered 2022-04-04: 10 mg via INTRAVENOUS

## 2022-04-04 MED ORDER — POTASSIUM CHLORIDE CRYS ER 20 MEQ PO TBCR: 20.0000 meq | EXTENDED_RELEASE_TABLET | Freq: Every day | ORAL | Status: DC

## 2022-04-04 MED ORDER — PANTOPRAZOLE SODIUM 40 MG PO TBEC
40.0000 mg | DELAYED_RELEASE_TABLET | Freq: Every day | ORAL | Status: DC
  Administered 2022-04-04 – 2022-04-14 (×10): 40 mg via ORAL
  Filled 2022-04-04 (×11): qty 1

## 2022-04-04 MED ORDER — ONDANSETRON HCL 4 MG/2ML IJ SOLN
INTRAMUSCULAR | Status: DC | PRN
  Administered 2022-04-04: 4 mg via INTRAVENOUS

## 2022-04-04 MED ORDER — FENTANYL CITRATE (PF) 250 MCG/5ML IJ SOLN
INTRAMUSCULAR | Status: DC | PRN
  Administered 2022-04-04 (×2): 50 ug via INTRAVENOUS

## 2022-04-04 MED ORDER — OXYCODONE HCL 5 MG PO TABS
5.0000 mg | ORAL_TABLET | ORAL | Status: DC | PRN
  Administered 2022-04-06 – 2022-04-07 (×2): 10 mg via ORAL
  Filled 2022-04-04 (×2): qty 2

## 2022-04-04 MED ORDER — METRONIDAZOLE 500 MG/100ML IV SOLN
500.0000 mg | Freq: Three times a day (TID) | INTRAVENOUS | Status: DC
  Administered 2022-04-04 – 2022-04-05 (×3): 500 mg via INTRAVENOUS
  Filled 2022-04-04 (×3): qty 100

## 2022-04-04 MED ORDER — OXYCODONE HCL 5 MG PO TABS
10.0000 mg | ORAL_TABLET | ORAL | Status: DC | PRN
  Administered 2022-04-04 – 2022-04-06 (×6): 10 mg via ORAL
  Filled 2022-04-04 (×7): qty 2

## 2022-04-04 MED ORDER — 0.9 % SODIUM CHLORIDE (POUR BTL) OPTIME
TOPICAL | Status: DC | PRN
  Administered 2022-04-04: 1000 mL

## 2022-04-04 MED ORDER — ONDANSETRON HCL 4 MG/2ML IJ SOLN
INTRAMUSCULAR | Status: AC
  Filled 2022-04-04: qty 2

## 2022-04-04 MED ORDER — PHENYLEPHRINE HCL-NACL 20-0.9 MG/250ML-% IV SOLN
INTRAVENOUS | Status: DC | PRN
  Administered 2022-04-04: 40 ug/min via INTRAVENOUS

## 2022-04-04 MED ORDER — GUAIFENESIN-DM 100-10 MG/5ML PO SYRP: 15.0000 mL | ORAL_SOLUTION | ORAL | Status: DC | PRN

## 2022-04-04 MED ORDER — MAGNESIUM SULFATE 2 GM/50ML IV SOLN
2.0000 g | Freq: Every day | INTRAVENOUS | Status: AC | PRN
  Administered 2022-04-06: 2 g via INTRAVENOUS
  Filled 2022-04-04: qty 50

## 2022-04-04 MED ORDER — POTASSIUM CHLORIDE CRYS ER 20 MEQ PO TBCR: 20.0000 meq | EXTENDED_RELEASE_TABLET | Freq: Every day | ORAL | Status: DC | PRN

## 2022-04-04 MED ORDER — OXYCODONE HCL 5 MG PO TABS
ORAL_TABLET | ORAL | Status: AC
  Filled 2022-04-04: qty 1

## 2022-04-04 MED ORDER — ATORVASTATIN CALCIUM 80 MG PO TABS
80.0000 mg | ORAL_TABLET | Freq: Every day | ORAL | Status: DC
  Administered 2022-04-04 – 2022-04-14 (×10): 80 mg via ORAL
  Filled 2022-04-04 (×13): qty 1

## 2022-04-04 MED ORDER — JUVEN PO PACK
1.0000 | PACK | Freq: Two times a day (BID) | ORAL | Status: DC
  Administered 2022-04-04 – 2022-04-14 (×18): 1 via ORAL
  Filled 2022-04-04 (×18): qty 1

## 2022-04-04 MED ORDER — LACTATED RINGERS IV SOLN: INTRAVENOUS | Status: DC

## 2022-04-04 MED ORDER — LIDOCAINE 2% (20 MG/ML) 5 ML SYRINGE
INTRAMUSCULAR | Status: DC | PRN
  Administered 2022-04-04: 100 mg via INTRAVENOUS

## 2022-04-04 MED ORDER — ONDANSETRON HCL 4 MG/2ML IJ SOLN: 4.0000 mg | Freq: Four times a day (QID) | INTRAMUSCULAR | Status: DC | PRN

## 2022-04-04 MED ORDER — CHLORHEXIDINE GLUCONATE 0.12 % MT SOLN
OROMUCOSAL | Status: AC
  Filled 2022-04-04: qty 15

## 2022-04-04 MED ORDER — CEFAZOLIN IN SODIUM CHLORIDE 3-0.9 GM/100ML-% IV SOLN
3.0000 g | INTRAVENOUS | Status: AC
  Administered 2022-04-04: 3 g via INTRAVENOUS
  Filled 2022-04-04: qty 100

## 2022-04-04 SURGICAL SUPPLY — 36 items
BAG COUNTER SPONGE SURGICOUNT (BAG) IMPLANT
BLADE SURG 21 STRL SS (BLADE) ×1 IMPLANT
BNDG COHESIVE 6X5 TAN ST LF (GAUZE/BANDAGES/DRESSINGS) IMPLANT
BNDG COHESIVE 6X5 TAN STRL LF (GAUZE/BANDAGES/DRESSINGS) IMPLANT
BNDG GAUZE DERMACEA FLUFF 4 (GAUZE/BANDAGES/DRESSINGS) ×2 IMPLANT
COVER SURGICAL LIGHT HANDLE (MISCELLANEOUS) ×2 IMPLANT
DRAPE DERMATAC (DRAPES) IMPLANT
DRAPE U-SHAPE 47X51 STRL (DRAPES) ×1 IMPLANT
DRESSING VERAFLO CLEANS CC MED (GAUZE/BANDAGES/DRESSINGS) IMPLANT
DRSG ADAPTIC 3X8 NADH LF (GAUZE/BANDAGES/DRESSINGS) ×1 IMPLANT
DRSG VERAFLO CLEANSE CC MED (GAUZE/BANDAGES/DRESSINGS) ×1
DURAPREP 26ML APPLICATOR (WOUND CARE) ×1 IMPLANT
ELECT REM PT RETURN 9FT ADLT (ELECTROSURGICAL)
ELECTRODE REM PT RTRN 9FT ADLT (ELECTROSURGICAL) IMPLANT
GAUZE SPONGE 4X4 12PLY STRL (GAUZE/BANDAGES/DRESSINGS) ×1 IMPLANT
GLOVE BIOGEL PI IND STRL 9 (GLOVE) ×1 IMPLANT
GLOVE SURG ORTHO 9.0 STRL STRW (GLOVE) ×1 IMPLANT
GOWN STRL REUS W/ TWL XL LVL3 (GOWN DISPOSABLE) ×2 IMPLANT
GOWN STRL REUS W/TWL XL LVL3 (GOWN DISPOSABLE) ×2
GRAFT SKIN WND MICRO 38 (Tissue) IMPLANT
HANDPIECE INTERPULSE COAX TIP (DISPOSABLE)
KIT BASIN OR (CUSTOM PROCEDURE TRAY) ×1 IMPLANT
KIT TURNOVER KIT B (KITS) ×1 IMPLANT
MANIFOLD NEPTUNE II (INSTRUMENTS) ×1 IMPLANT
NS IRRIG 1000ML POUR BTL (IV SOLUTION) ×1 IMPLANT
PACK ORTHO EXTREMITY (CUSTOM PROCEDURE TRAY) ×1 IMPLANT
PAD ARMBOARD 7.5X6 YLW CONV (MISCELLANEOUS) ×2 IMPLANT
PAD NEG PRESSURE SENSATRAC (MISCELLANEOUS) IMPLANT
SET HNDPC FAN SPRY TIP SCT (DISPOSABLE) IMPLANT
STOCKINETTE IMPERVIOUS 9X36 MD (GAUZE/BANDAGES/DRESSINGS) IMPLANT
SUT ETHILON 2 0 PSLX (SUTURE) ×1 IMPLANT
SWAB COLLECTION DEVICE MRSA (MISCELLANEOUS) ×1 IMPLANT
SWAB CULTURE ESWAB REG 1ML (MISCELLANEOUS) IMPLANT
TOWEL GREEN STERILE (TOWEL DISPOSABLE) ×1 IMPLANT
TUBE CONNECTING 12X1/4 (SUCTIONS) ×1 IMPLANT
YANKAUER SUCT BULB TIP NO VENT (SUCTIONS) ×1 IMPLANT

## 2022-04-04 NOTE — H&P (Signed)
KOLE HILYARD is an 76 y.o. male.   Chief Complaint: Chronic ulcer left proximal tibia. HPI: Patient is a 76 year old gentleman who is seen for initial evaluation for chronic abscess left proximal leg.  Patient has been on Augmentin and most recently completed a course of sulfamethoxazole trimethoprim.  Patient complains of a large ulcer with drainage.   Patient states the area of injury started a year ago when he had his leg pinned between a bobcat blade and a truck.  Past Medical History:  Diagnosis Date   A-fib [facility])    Acute on chronic diastolic CHF (congestive heart failure) ([place]) 07/21/2020   Coronary artery disease    Diabetes mellitus without complication (HCC)    Dyspnea    Dysrhythmia    A-fib   Hyperlipidemia    Hypertension    Metabolic syndrome    Prediabetes    Sleep apnea    STEMI (ST elevation myocardial infarction) [facility])     Past Surgical History:  Procedure Laterality Date   APPENDECTOMY     CHOLECYSTECTOMY N/A 03/02/2020   Procedure: LAPAROSCOPIC CHOLECYSTECTOMY;  Surgeon: Aviva Signs, MD;  Location: [facility];  Service: General;  Laterality: N/A;   CORONARY STENT INTERVENTION N/A 04/30/2020   Procedure: CORONARY STENT INTERVENTION;  Surgeon: Martinique, Peter M, MD;  Location: [place] CV LAB;  Service: Cardiovascular;  Laterality: N/A;   CORONARY/GRAFT ACUTE MI REVASCULARIZATION N/A 04/30/2020   Procedure: Coronary/Graft Acute MI Revascularization;  Surgeon: Martinique, Peter M, MD;  Location: [place] CV LAB;  Service: Cardiovascular;  Laterality: N/A;   Eyelid Surgery Bilateral    HERNIA REPAIR     knee tendons repair     LEFT HEART CATH AND CORONARY ANGIOGRAPHY N/A 04/30/2020   Procedure: LEFT HEART CATH AND CORONARY ANGIOGRAPHY;  Surgeon: Martinique, Peter M, MD;  Location: [place] CV LAB;  Service: Cardiovascular;  Laterality: N/A;   ROTATOR CUFF REPAIR Bilateral    TONSILLECTOMY      Family History  Problem Relation Age of Onset   Cancer Mother         lungs   Coronary artery disease Mother    Diabetes Brother    Heart attack Brother    Diabetes Maternal Grandmother    Arthritis Father    Social History:  reports that he quit smoking about 63 years ago. His smoking use included cigarettes. He has never used smokeless tobacco. He reports that he does not drink alcohol and does not use drugs.  Allergies:  Allergies  Allergen Reactions   Flecainide     Per patient dizziness, shortness of breath, blurred vision   Rofecoxib Rash and Other (See Comments)    (Vioxx)    No medications prior to admission.    No results found for this or any previous visit (from the past 48 hour(s)). XR Tibia/Fibula Left  Result Date: 04/02/2022 2 view radiographs of the left knee shows no evidence of any destructive bony changes of the proximal tibia.   Review of Systems  All other systems reviewed and are negative.   There were no vitals taken for this visit. Physical Exam  Patient is alert, oriented, no adenopathy, well-dressed, normal affect, normal respiratory effort. Examination patient has massive lymphedema both lower extremities.  There is cellulitis and swelling of the proximal medial aspect of the left leg.  The ulcer probes to bone.  There is drainage.  Necrotic ulcer is 2 cm in diameter.  Most recent hemoglobin A1c is 7.0.  Albumin  3.5. Assessment/Plan 1. Pain in left leg   2. Abscess of left lower leg       Plan: With the large abscess and the ulcer communicating to bone fillets urgent to proceed with surgical intervention Wednesday we will start IV antibiotics postoperatively plan for repeat debridement on Friday.  We will obtain an MRI scan while in the hospital to evaluate for underlying osteomyelitis.  Newt Minion, MD 04/04/2022, 6:42 AM

## 2022-04-04 NOTE — Progress Notes (Signed)
Pharmacy Antibiotic Note  Ryan Garner is a 76 y.o. male admitted on 04/04/2022 with chronic abscess left proximal tibia. S/p debridement today. Pharmacy has been consulted for Vancomycin dosing for contaminated margins and high risk MRSA.  Operative culture sent.   Last creatinine 1.33 on 03/08/22.  Seems to run 1.2-1.5.  Bmet added today and creatinine is 1.71.   Plan: Vancomycin 2500 mg IV x 1 loading dose. Vancomycin 1750 mg IV Q 36 hrs.  Goal AUC 400-550. Expected AUC: 527 SCr used: 1.71; Vd 0.5L/kg for BMI > 30 Also Ceftriaxone 2 gm IV q24h and Metronidazole 500 mg IV q8hrs. Follow renal function, culture data, clinical progress and antibiotic plans. De-escalate antibiotics as able.   Height: '5\' 8"'$  (172.7 cm) Weight: 134.3 kg (296 lb) IBW/kg (Calculated) : 68.4  Temp (24hrs), Avg:97.3 F (36.3 C), Min:97 F (36.1 C), Max:97.9 F (36.6 C)  Recent Labs  Lab 04/04/22 1330  CREATININE 1.71*    Estimated Creatinine Clearance: 47.7 mL/min (A) (by C-G formula based on SCr of 1.71 mg/dL (H)).    Allergies  Allergen Reactions   Flecainide     Per patient dizziness, shortness of breath, blurred vision   Rofecoxib Rash and Other (See Comments)    (Vioxx)    Antimicrobials this admission: Cefazolin x 1 pre-op 9/13 Vancomycin 9/13 >> Ceftriaxone 9/13 >> Metronidazole IV 9/13 >>   9/13 surgical culture:  Dose adjustments this admission: n/a  Microbiology results: 9/13 surgical culture (tissue): sent  Thank you for allowing pharmacy to be a part of this patient's care.  Ryan Garner, [name] 04/04/2022 4:03 PM

## 2022-04-04 NOTE — Transfer of Care (Signed)
Immediate Anesthesia Transfer of Care Note  Patient: [name]  Procedure(s) Performed: LEFT LEG DEBRIDEMENT (Left: Leg Lower)  Patient Location: PACU  Anesthesia Type:General  Level of Consciousness: drowsy  Airway & Oxygen Therapy: Patient Spontanous Breathing and Patient connected to face mask oxygen  Post-op Assessment: Report given to RN and Post -op Vital signs reviewed and stable  Post vital signs: Reviewed and stable  Last Vitals:  Vitals Value Taken Time  BP 114/94 04/04/22 1018  Temp    Pulse 80 04/04/22 1021  Resp 17 04/04/22 1022  SpO2 97 % 04/04/22 1021  Vitals shown include unvalidated device data.  Last Pain:  Vitals:   04/04/22 0802  TempSrc:   PainSc: 3          Complications: No notable events documented.

## 2022-04-04 NOTE — Progress Notes (Signed)
Patient arrived to unit with moderate bloody drainage coming from dressing site. Reinforced with gauze and ABD. MD Sharol Given paged.

## 2022-04-04 NOTE — Anesthesia Postprocedure Evaluation (Signed)
Anesthesia Post Note  Patient: Ryan Garner  Procedure(s) Performed: LEFT LEG DEBRIDEMENT (Left: Leg Lower)     Patient location during evaluation: PACU Anesthesia Type: General Level of consciousness: awake and alert Pain management: pain level controlled Vital Signs Assessment: post-procedure vital signs reviewed and stable Respiratory status: spontaneous breathing, nonlabored ventilation and respiratory function stable Cardiovascular status: blood pressure returned to baseline Postop Assessment: no apparent nausea or vomiting Anesthetic complications: no   No notable events documented.  Last Vitals:  Vitals:   04/04/22 1100 04/04/22 1115  BP: (!) 92/53 93/65  Pulse: 73 65  Resp: 18 11  Temp:    SpO2: 94% 92%    Last Pain:  Vitals:   04/04/22 1100  TempSrc:   PainSc: Asleep                 Marthenia Rolling

## 2022-04-04 NOTE — Discharge Summary (Signed)
Ryan Garner, is a 76 y.o. male  DOB 1945/12/25  MRN [id number].  Admission date:  03/01/2022  Admitting Physician  Roxan Hockey, MD  Discharge Date:  03/05/2022   Primary MD  [name], [name], MD  Recommendations for primary care physician for things to follow:   1)Very low-salt diet advised 2)Weigh yourself daily, call if you gain more than 3 pounds in 1 day or more than 5 pounds in 1 week as your diuretic medications may need to be adjusted 3)Avoid ibuprofen/Advil/Aleve/Motrin/Goody Powders/Naproxen/BC powders/Meloxicam/Diclofenac/Indomethacin and other Nonsteroidal anti-inflammatory medications as these will make you more likely to bleed and can cause stomach ulcers, can also cause Kidney problems.  4) repeat CBC and BMP blood test with primary care physician within a week  Admission Diagnosis  Peripheral edema [R60.9] Cellulitis and abscess of leg [L03.119, L02.419] Pulmonary hypertension (HCC) [I27.20] Cellulitis of left leg [L03.116] Cellulitis of left lower extremity [L03.116]   Discharge Diagnosis  Peripheral edema [R60.9] Cellulitis and abscess of leg [L03.119, L02.419] Pulmonary hypertension (HCC) [I27.20] Cellulitis of left leg [L03.116] Cellulitis of left lower extremity [L03.116]    Principal Problem:   Cellulitis of left leg Active Problems:   Cellulitis of left lower extremity   Type 2 diabetes mellitus (HCC)   Atrial fibrillation (HCC)   Chronic diastolic CHF (congestive heart failure) (HCC)   Hypertension   Hyperlipidemia   Abscess of left lower leg      Past Medical History:  Diagnosis Date   A-fib (HCC)    Acute on chronic diastolic CHF (congestive heart failure) ([place]) 07/21/2020   Coronary artery disease    Diabetes mellitus without complication (HCC)    Dyspnea    Dysrhythmia    A-fib   Hyperlipidemia    Hypertension    Metabolic syndrome    Prediabetes     Sleep apnea    STEMI (ST elevation myocardial infarction) [facility])     Past Surgical History:  Procedure Laterality Date   APPENDECTOMY     CHOLECYSTECTOMY N/A 03/02/2020   Procedure: LAPAROSCOPIC CHOLECYSTECTOMY;  Surgeon: Aviva Signs, MD;  Location: [facility];  Service: General;  Laterality: N/A;   CORONARY STENT INTERVENTION N/A 04/30/2020   Procedure: CORONARY STENT INTERVENTION;  Surgeon: Martinique, Peter M, MD;  Location: [place] CV LAB;  Service: Cardiovascular;  Laterality: N/A;   CORONARY/GRAFT ACUTE MI REVASCULARIZATION N/A 04/30/2020   Procedure: Coronary/Graft Acute MI Revascularization;  Surgeon: Martinique, Peter M, MD;  Location: [place] CV LAB;  Service: Cardiovascular;  Laterality: N/A;   Eyelid Surgery Bilateral    HERNIA REPAIR     knee tendons repair     LEFT HEART CATH AND CORONARY ANGIOGRAPHY N/A 04/30/2020   Procedure: LEFT HEART CATH AND CORONARY ANGIOGRAPHY;  Surgeon: Martinique, Peter M, MD;  Location: [place] CV LAB;  Service: Cardiovascular;  Laterality: N/A;   ROTATOR CUFF REPAIR Bilateral    TONSILLECTOMY       HPI  from the history and physical done on the day of admission:  [name] is a 76 y/o followed for HFpEF with intermittent SOB, CAD w/ h/o STEMI s/p PCI Stenting of single vessel, rate controlled A. Fib on Eliquis, HTN, HLD, pulm HTN, DM2, obesity and prior h/o cellulitis left LE. He has recurrent cellulitis Left LE and was recently started on Augmentin however he has increased erythema and rapid spread up his leg. He presents to [facility] for further evaluation of his infected leg.     ED Course: Afebrile BP 104/66  HR 90 BMI 44.6 Patient was c/o SOB. EDP initiated code sepsis - no fluid bolus given, rapid administration of IV Rocephin done. Patient was stabilized. Lab revealed K 3.2, Cr 1.54 (baseline 1.22) BNP 485 - at his baseline. WBC 13.3 with 90/4/5, blood Cx's pending. CXR w/ chronic effusion no acute disease. TR[facility] called to admit patient for  continued mgt.     Hospital Course:   Brief Summary:- 76 y/o followed for HFpEF with intermittent SOB, CAD w/ h/o STEMI s/p PCI Stenting of single vessel, rate controlled A. Fib on Eliquis, HTN, HLD, pulm HTN, DM2, obesity and prior h/o cellulitis left LE admitted on 03/02/2022 with recurrent left lower extremity cellulitis patient failed outpatient Augmentin prior to admission   A/p 1)Recurrent left lower extremity cellulitis--failed outpatient Augmentin -Treated with IV Rocephin WBC 13.3 >>9.7 - clinically much improved overall -Okay to discharge on Omnicef and also add doxycycline for MRSA coverage   2)DM2-continue Jardiance   3)PAFib--- continue metoprolol for rate control and Eliquis for stroke prophylaxis   4)HFpEF----chronic diastolic dysfunction CHF -Appears compensated at this time -Continue Lasix and metolazone   5)COPD--stable, no acute exacerbation, continue bronchodilators   6)AKI----acute kidney injury on CKD stage -   - creatinine on admission= 1.54,  baseline creatinine = 1.2   ,  creatinine is stable --renally adjust medications, avoid nephrotoxic agents / dehydration  / hypotension  7) generalized weakness and deconditioning----Home health services requested    Disposition: The patient is from: Home              Anticipated d/c is to: Home  Discharge Condition: stable  Follow UP   Follow-up Information     [name], [name], MD. Call in 1 week(s).   Specialties: Family Medicine, Cardiology Why: Repeat CBC and BMP within 1 week Contact information: [place] [address] [phone]         Fay Records, MD .   Specialty: Cardiology Contact information: [address] [place] [address] [phone]         [facility] Follow up.   Specialty: [place] Why: [facility] staff will call you to arrange nursing visit for post hospital follow up assessment                  Diet and Activity recommendation:  As advised  Discharge Instructions    Discharge Instructions     Call MD for:  difficulty breathing, headache or visual disturbances   Complete by: As directed    Call MD for:  persistant dizziness or light-headedness   Complete by: As directed    Call MD for:  persistant nausea and vomiting   Complete by: As directed    Call MD for:  redness, tenderness, or signs of infection (pain, swelling, redness, odor or green/yellow discharge around incision site)   Complete by: As directed    Call MD for:  temperature >100.4   Complete by: As directed  Diet - low sodium heart healthy   Complete by: As directed    Diet Carb Modified   Complete by: As directed    Discharge instructions   Complete by: As directed    1)Very low-salt diet advised 2)Weigh yourself daily, call if you gain more than 3 pounds in 1 day or more than 5 pounds in 1 week as your diuretic medications may need to be adjusted 3)Avoid ibuprofen/Advil/Aleve/Motrin/Goody Powders/Naproxen/BC powders/Meloxicam/Diclofenac/Indomethacin and other Nonsteroidal anti-inflammatory medications as these will make you more likely to bleed and can cause stomach ulcers, can also cause Kidney problems.  4) repeat CBC and BMP blood test with primary care physician within a week   Increase activity slowly   Complete by: As directed          Discharge Medications     Allergies as of 03/05/2022       Reactions   Flecainide    Per patient dizziness, shortness of breath, blurred vision   Rofecoxib Other (See Comments)   (Vioxx)        Medication List     STOP taking these medications    amoxicillin-clavulanate 875-125 MG tablet Commonly known as: AUGMENTIN   fluticasone 50 MCG/ACT nasal spray Commonly known as: FLONASE       TAKE these medications    albuterol 108 (90 Base) MCG/ACT inhaler Commonly known as: VENTOLIN HFA Inhale 2 puffs into the lungs every 6 (six) hours as  needed for wheezing or shortness of breath.   apixaban 5 MG Tabs tablet Commonly known as: ELIQUIS Take 1 tablet (5 mg total) by mouth 2 (two) times daily.   atorvastatin 80 MG tablet Commonly known as: LIPITOR Take 1 tablet by mouth once daily   clopidogrel 75 MG tablet Commonly known as: PLAVIX Take 1 tablet (75 mg total) by mouth daily with breakfast.   metoprolol tartrate 50 MG tablet Commonly known as: LOPRESSOR Take 1 tablet (50 mg total) by mouth 2 (two) times daily.   multivitamin with minerals Tabs tablet Take 1 tablet by mouth daily.   nitroGLYCERIN 0.4 MG SL tablet Commonly known as: NITROSTAT Place 1 tablet (0.4 mg total) under the tongue every 5 (five) minutes x 3 doses as needed for chest pain.   potassium chloride SA 20 MEQ tablet Commonly known as: Klor-Con M20 Take 40 meq on Monday,Wednesday,Friday, take 20 meq all other days   Prostate Support 300-15 MG Tabs Take 1 tablet by mouth daily. Super Beta Prostate   Spiriva Respimat 1.25 MCG/ACT Aers Generic drug: Tiotropium Bromide Monohydrate Inhale 2 puffs into the lungs daily.        Major procedures and Radiology Reports - PLEASE review detailed and final reports for all details, in brief -    XR Tibia/Fibula Left  Result Date: 04/02/2022 2 view radiographs of the left knee shows no evidence of any destructive bony changes of the proximal tibia.   Micro Results   No results found for this or any previous visit (from the past 240 hour(s)).  Today   Subjective    Mont [place] today has no new complaints No fever  Or chills   No Nausea, Vomiting or Diarrhea   Patient has been seen and examined prior to discharge   Objective   Blood pressure 93/66, pulse 68, temperature 97.7 F (36.5 C), resp. rate 16, height '5\' 8"'$  (1.727 m), weight 134.3 kg, SpO2 98 %.  No intake or output data in the 24 hours ending 04/04/22 1914  Exam Gen:- Awake Alert, no acute distress  HEENT:- [place].AT, No sclera  icterus Neck-Supple Neck,No JVD,.  Lungs-  CTAB , good air movement bilaterally CV- S1, S2 normal, regular Abd-  +ve B.Sounds, Abd Soft, No tenderness,    Extremity/Skin:-   good pulses, much improved erythema, improved warmth, no significant tenderness at this time, no streaking or purulence Psych-affect is appropriate, oriented x3 Neuro-no new focal deficits, no tremors    Data Review   CBC w Diff:  Lab Results  Component Value Date   WBC 11.0 (H) 03/08/2022   WBC 9.7 03/03/2022   HGB 13.6 03/08/2022   HCT 40.0 03/08/2022   PLT 285 03/08/2022   LYMPHOPCT 4 03/01/2022   MONOPCT 5 03/01/2022   EOSPCT 0 03/01/2022   BASOPCT 0 03/01/2022    CMP:  Lab Results  Component Value Date   NA 134 (L) 04/04/2022   NA 137 03/08/2022   K 4.7 04/04/2022   CL 102 04/04/2022   CO2 23 04/04/2022   BUN 32 (H) 04/04/2022   BUN 25 03/08/2022   CREATININE 1.71 (H) 04/04/2022   PROT 6.3 03/08/2022   ALBUMIN 3.5 (L) 03/08/2022   BILITOT 0.9 03/08/2022   ALKPHOS 96 03/08/2022   AST 22 03/08/2022   ALT 19 03/08/2022   Total Discharge time is about 33 minutes  Roxan Hockey M.D on 04/04/2022 at 7:14 PM  Go to [email or web address] -  for contact info  [facility] - Office  [phone]

## 2022-04-04 NOTE — Anesthesia Procedure Notes (Signed)
Procedure Name: LMA Insertion Date/Time: 04/04/2022 9:40 AM  Performed by: Reece Agar, CRNAPre-anesthesia Checklist: Patient identified, Emergency Drugs available, Suction available and Patient being monitored Patient Re-evaluated:Patient Re-evaluated prior to induction Oxygen Delivery Method: Circle System Utilized Preoxygenation: Pre-oxygenation with 100% oxygen Induction Type: IV induction Ventilation: Mask ventilation without difficulty LMA: LMA inserted LMA Size: 5.0 Number of attempts: 1 Airway Equipment and Method: Bite block Placement Confirmation: positive ETCO2 Tube secured with: Tape Dental Injury: Teeth and Oropharynx as per pre-operative assessment

## 2022-04-04 NOTE — Op Note (Addendum)
04/04/2022  10:32 AM  PATIENT:  Ryan Garner    PRE-OPERATIVE DIAGNOSIS:  Abscess Left Proximal Tibia  POST-OPERATIVE DIAGNOSIS:  Same  PROCEDURE:  LEFT LEG EXCISIONAL DEBRIDEMENT DEBRIDEMENT  SURGEON:  Newt Minion, MD  PHYSICIAN ASSISTANT:None ANESTHESIA:   General  PREOPERATIVE INDICATIONS:  [name] is a  76 y.o. male with a diagnosis of Abscess Left Proximal Tibia who failed conservative measures and elected for surgical management.    The risks benefits and alternatives were discussed with the patient preoperatively including but not limited to the risks of infection, bleeding, nerve injury, cardiopulmonary complications, the need for revision surgery, among others, and the patient was willing to proceed.  OPERATIVE IMPLANTS: Kerecis micro powder 38 cm.  '@ENCIMAGES'$ @  OPERATIVE FINDINGS: Abscess appeared to extend down to fascia but did not seem to communicate with bone.  Tissue and purulent abscess sent for cultures.  OPERATIVE PROCEDURE: Patient was brought to the operating room and underwent a general anesthetic.  After adequate levels anesthesia obtained patient's left lower extremity was prepped using DuraPrep draped into a sterile field a timeout was called.  A longitudinal incision was made between the 2 draining ulcers over the proximal anterior tibia.  There was a large purulent abscess as well as necrotic deep soft tissue.  The soft tissue and abscess was debrided and irrigated.  This tissue was sent for tissue cultures.  The fascia appeared intact with no abscess deep to the fascia.  This did not appear to communicate with bone.  After irrigation with normal saline the wound was filled with 38 cm of Kerecis micro powder this was then filled with 3 cleanse choice wound VAC sponges and a bridge sponge was placed over this for connection of the 2 previous ulcers.  This had a good suction fit this was then overwrapped with Coban patient was extubated taken the PACU  in stable condition.  Debridement type: Excisional Debridement  Side: left  Body Location: leg   Tools used for debridement: scalpel and rongeur  Pre-debridement Wound size (cm):   Length: 1        Width: 1     Depth: 1   Post-debridement Wound size (cm):   Length: 15        Width: 5     Depth: 5   Debridement depth beyond dead/damaged tissue down to healthy viable tissue: yes  Tissue layer involved: skin, subcutaneous tissue  Nature of tissue removed: Slough, Necrotic, Devitalized Tissue, Non-viable tissue, and Purulence  Irrigation volume: 1 liter     Irrigation fluid type: Normal Saline     DISCHARGE PLANNING:  Antibiotic duration: IV antibiotics will adjust according to culture sensitivities.  Anticipate 3 to 4 weeks of antibiotics.  Weightbearing: Weightbearing as tolerated  Pain medication: Opioid pathway  Dressing care/ Wound VAC: Continue wound VAC  Ambulatory devices: Walker or crutches  Discharge to: Anticipate return to the operating room on Friday for repeat debridement.  Follow-up: In the office 1 week post operative.

## 2022-04-05 ENCOUNTER — Encounter (HOSPITAL_COMMUNITY): Payer: Self-pay | Admitting: Orthopedic Surgery

## 2022-04-05 ENCOUNTER — Ambulatory Visit: Payer: Medicare Other | Admitting: Family Medicine

## 2022-04-05 DIAGNOSIS — I5043 Acute on chronic combined systolic (congestive) and diastolic (congestive) heart failure: Secondary | ICD-10-CM | POA: Diagnosis not present

## 2022-04-05 DIAGNOSIS — L02416 Cutaneous abscess of left lower limb: Secondary | ICD-10-CM | POA: Diagnosis not present

## 2022-04-05 DIAGNOSIS — E785 Hyperlipidemia, unspecified: Secondary | ICD-10-CM | POA: Diagnosis not present

## 2022-04-05 DIAGNOSIS — Z7901 Long term (current) use of anticoagulants: Secondary | ICD-10-CM | POA: Diagnosis not present

## 2022-04-05 DIAGNOSIS — N179 Acute kidney failure, unspecified: Secondary | ICD-10-CM | POA: Diagnosis not present

## 2022-04-05 DIAGNOSIS — I480 Paroxysmal atrial fibrillation: Secondary | ICD-10-CM | POA: Diagnosis not present

## 2022-04-05 DIAGNOSIS — E8881 Metabolic syndrome: Secondary | ICD-10-CM | POA: Diagnosis not present

## 2022-04-05 DIAGNOSIS — Z87891 Personal history of nicotine dependence: Secondary | ICD-10-CM | POA: Diagnosis not present

## 2022-04-05 DIAGNOSIS — J449 Chronic obstructive pulmonary disease, unspecified: Secondary | ICD-10-CM | POA: Diagnosis not present

## 2022-04-05 DIAGNOSIS — L03116 Cellulitis of left lower limb: Secondary | ICD-10-CM | POA: Diagnosis not present

## 2022-04-05 DIAGNOSIS — I2510 Atherosclerotic heart disease of native coronary artery without angina pectoris: Secondary | ICD-10-CM | POA: Diagnosis not present

## 2022-04-05 DIAGNOSIS — I272 Pulmonary hypertension, unspecified: Secondary | ICD-10-CM | POA: Diagnosis not present

## 2022-04-05 DIAGNOSIS — I130 Hypertensive heart and chronic kidney disease with heart failure and stage 1 through stage 4 chronic kidney disease, or unspecified chronic kidney disease: Secondary | ICD-10-CM | POA: Diagnosis not present

## 2022-04-05 DIAGNOSIS — E1122 Type 2 diabetes mellitus with diabetic chronic kidney disease: Secondary | ICD-10-CM | POA: Diagnosis not present

## 2022-04-05 DIAGNOSIS — Z6841 Body Mass Index (BMI) 40.0 and over, adult: Secondary | ICD-10-CM | POA: Diagnosis not present

## 2022-04-05 DIAGNOSIS — N189 Chronic kidney disease, unspecified: Secondary | ICD-10-CM | POA: Diagnosis not present

## 2022-04-05 DIAGNOSIS — E669 Obesity, unspecified: Secondary | ICD-10-CM | POA: Diagnosis not present

## 2022-04-05 DIAGNOSIS — Z955 Presence of coronary angioplasty implant and graft: Secondary | ICD-10-CM | POA: Diagnosis not present

## 2022-04-05 LAB — CBC
HCT: 33.5 % — ABNORMAL LOW (ref 39.0–52.0)
Hemoglobin: 11.1 g/dL — ABNORMAL LOW (ref 13.0–17.0)
MCH: 30.8 pg (ref 26.0–34.0)
MCHC: 33.1 g/dL (ref 30.0–36.0)
MCV: 93.1 fL (ref 80.0–100.0)
Platelets: 270 10*3/uL (ref 150–400)
RBC: 3.6 MIL/uL — ABNORMAL LOW (ref 4.22–5.81)
RDW: 14 % (ref 11.5–15.5)
WBC: 11.8 10*3/uL — ABNORMAL HIGH (ref 4.0–10.5)
nRBC: 0 % (ref 0.0–0.2)

## 2022-04-05 LAB — BASIC METABOLIC PANEL
Anion gap: 7 (ref 5–15)
BUN: 36 mg/dL — ABNORMAL HIGH (ref 8–23)
CO2: 22 mmol/L (ref 22–32)
Calcium: 8.6 mg/dL — ABNORMAL LOW (ref 8.9–10.3)
Chloride: 99 mmol/L (ref 98–111)
Creatinine, Ser: 1.81 mg/dL — ABNORMAL HIGH (ref 0.61–1.24)
GFR, Estimated: 38 mL/min — ABNORMAL LOW (ref >60)
Glucose, Bld: 173 mg/dL — ABNORMAL HIGH (ref 70–99)
Potassium: 4.9 mmol/L (ref 3.5–5.1)
Sodium: 128 mmol/L — ABNORMAL LOW (ref 135–145)

## 2022-04-05 LAB — SURGICAL PCR SCREEN
MRSA, PCR: NEGATIVE
Staphylococcus aureus: NEGATIVE

## 2022-04-05 MED ORDER — METRONIDAZOLE 500 MG PO TABS
500.0000 mg | ORAL_TABLET | Freq: Two times a day (BID) | ORAL | Status: DC
  Administered 2022-04-05 – 2022-04-09 (×7): 500 mg via ORAL
  Filled 2022-04-05 (×8): qty 1

## 2022-04-05 MED ORDER — VANCOMYCIN HCL IN DEXTROSE 1-5 GM/200ML-% IV SOLN
1000.0000 mg | INTRAVENOUS | Status: DC
  Administered 2022-04-05 – 2022-04-08 (×4): 1000 mg via INTRAVENOUS
  Filled 2022-04-05 (×5): qty 200

## 2022-04-05 NOTE — Progress Notes (Signed)
Pharmacy Antibiotic Note  Ryan Garner is a 76 y.o. male admitted on 04/04/2022 with chronic abscess left proximal tibia. S/p debridement 9/13 by ortho. No evidence of osteo on MRI. Per ortho note, pt will be returning to OR 9/15 for possible wound closure. Pharmacy has been consulted for Vancomycin dosing for contaminated margins and high risk MRSA.  Operative culture sent.  Plan: Vancomycin 1,000 mg Q24H (Scr used 1.81, Vd used 0.5, eAUC 474) Ceftriaxone 2 gm IV q24h  Metronidazole 500 mg PO Q12H  Follow renal function, culture data, clinical progress and antibiotic plans.   Height: '5\' 8"'$  (172.7 cm) Weight: 134.3 kg (296 lb) IBW/kg (Calculated) : 68.4  Temp (24hrs), Avg:97.6 F (36.4 C), Min:97 F (36.1 C), Max:98.7 F (37.1 C)  Recent Labs  Lab 04/04/22 1330 04/05/22 0106  WBC  --  11.8*  CREATININE 1.71* 1.81*     Estimated Creatinine Clearance: 45.1 mL/min (A) (by C-G formula based on SCr of 1.81 mg/dL (H)).    Allergies  Allergen Reactions   Flecainide     Per patient dizziness, shortness of breath, blurred vision   Rofecoxib Rash and Other (See Comments)    (Vioxx)    Antimicrobials this admission: Cefazolin x 1 pre-op 9/13 Vancomycin 9/13 >> Ceftriaxone 9/13 >> Metronidazole IV 9/13 >>  Microbiology results: 9/13 surgical culture (tissue): sent, no orgs seen   Thank you for allowing pharmacy to be a part of this patient's care.  Adria Dill, PharmD PGY-2 Infectious Diseases Resident  04/05/2022 12:38 PM

## 2022-04-05 NOTE — Progress Notes (Signed)
RT called to room to assist patient with setting up his home CPAP unit. Patient is able to apply the mask with no assistance. RN aware.

## 2022-04-05 NOTE — Evaluation (Signed)
Occupational Therapy Evaluation Patient Details Name: Ryan Garner MRN: [id number] DOB: [birth year] Today's Date: 04/05/2022   History of Present Illness Pt is a 76 yr old male who presented due to chronic abscess of L proximal leg due to an injury about a year ago when his leg was pinned between a bobcat blade and a truck. Pt s/p LLE excisional debridement  PMH: afib, CHF,  DM, HLD, HTN, STEMI, rotator cuff repair   Clinical Impression   Pt at PLOF reported using a Navos for ambulation and is the primary caregiver to their wife at baseline. Pt plans to have family assist until Tuesday and then they have lots of friends to also assist at dc. Pt today was limited with mobility/transfers due to draining from LLE. Pt was able to complete bed mobility with mod I and set up for UE ADLS.  Pt currently with functional limitations due to the deficits listed below (see OT Problem List).  Pt will benefit from skilled OT to increase their safety and independence with ADL and functional mobility for ADL to facilitate discharge to venue listed below.        Recommendations for follow up therapy are one component of a multi-disciplinary discharge planning process, led by the attending physician.  Recommendations may be updated based on patient status, additional functional criteria and insurance authorization.   Follow Up Recommendations   (TBD as evaluation limited due to amount of draining from LLE to complete a transfer at this time but based on what was seen at this time may be able to complete [facility] therapies)    Assistance Recommended at Discharge Intermittent Supervision/Assistance  Patient can return home with the following A little help with bathing/dressing/bathroom;A little help with walking and/or transfers    Functional Status Assessment  Patient has had a recent decline in their functional status and demonstrates the ability to make significant improvements in function in a reasonable and  predictable amount of time.  Equipment Recommendations   (TBD)    Recommendations for Other Services       Precautions / Restrictions Precautions Precautions: Fall Precaution Comments: wound vac LLE Restrictions Weight Bearing Restrictions: Yes LLE Weight Bearing: Weight bearing as tolerated      Mobility Bed Mobility Overal bed mobility: Modified Independent (used operations of bed but also reported that they have power bed at home)                  Transfers                   General transfer comment: was unable to complete transfers due to the amount of bleeding/draining from LLE      Balance                                           ADL either performed or assessed with clinical judgement   ADL Overall ADL's : Needs assistance/impaired Eating/Feeding: Sitting;Independent   Grooming: Wash/dry hands;Wash/dry face;Independent;Sitting   Upper Body Bathing: Set up;Sitting       Upper Body Dressing : Set up;Sitting                     General ADL Comments: Had to deffer LB/transfers as pt LLE started to drain blood and needed to elevate to stop at this time. Pt also did report feeling dizzy with positional  changes.     Vision Baseline Vision/History: 1 Wears glasses (reading) Vision Assessment?: No apparent visual deficits     Perception     Praxis      Pertinent Vitals/Pain Pain Assessment Pain Assessment: 0-10 Pain Score: 3  Pain Location: LLE Pain Descriptors / Indicators: Discomfort Pain Intervention(s): Limited activity within patient's tolerance, Monitored during session, Repositioned     Hand Dominance Right   Extremity/Trunk Assessment Upper Extremity Assessment Upper Extremity Assessment: Overall WFL for tasks assessed   Lower Extremity Assessment Lower Extremity Assessment: Defer to PT evaluation   Cervical / Trunk Assessment Cervical / Trunk Assessment: Normal   Communication  Communication Communication: No difficulties   Cognition Arousal/Alertness: Awake/alert Behavior During Therapy: WFL for tasks assessed/performed Overall Cognitive Status: Within Functional Limits for tasks assessed                                       General Comments       Exercises     Shoulder Instructions      Home Living Family/patient expects to be discharged to:: Private residence Living Arrangements: Spouse/significant other Available Help at Discharge: Friend(s) Type of Home: House Home Access: Level entry     Home Layout: One level     Bathroom Shower/Tub:  (walk in bathtub)         Home Equipment: Cane - single point (stand up walker)   Additional Comments: motorized bed      Prior Functioning/Environment Prior Level of Function : Independent/Modified Independent (caretaker to wife)             Mobility Comments: SPC use since injury          OT Problem List: Decreased strength;Decreased range of motion;Decreased activity tolerance;Impaired balance (sitting and/or standing);Decreased safety awareness;Decreased knowledge of use of DME or AE;Cardiopulmonary status limiting activity;Pain      OT Treatment/Interventions: Self-care/ADL training;Therapeutic exercise;DME and/or AE instruction;Therapeutic activities;Patient/family education;Balance training    OT Goals(Current goals can be found in the care plan section) Acute Rehab OT Goals Patient Stated Goal: to go home to wife OT Goal Formulation: With patient Time For Goal Achievement: 04/19/22 Potential to Achieve Goals: Good ADL Goals Pt Will Perform Lower Body Bathing: with modified independence;sit to/from stand Pt Will Perform Lower Body Dressing: with modified independence;sit to/from stand Pt Will Transfer to Toilet: with modified independence;ambulating Pt Will Perform Tub/Shower Transfer: with modified independence;ambulating Additional ADL Goal #1: Pt will tolerate  15 mins of standing tasks to be able to complete meal preperations at home level  OT Frequency: Min 2X/week    Co-evaluation              AM-PAC OT "6 Clicks" Daily Activity     Outcome Measure Help from another person eating meals?: None Help from another person taking care of personal grooming?: None Help from another person toileting, which includes using toliet, bedpan, or urinal?: A Little Help from another person bathing (including washing, rinsing, drying)?: A Little Help from another person to put on and taking off regular upper body clothing?: A Little Help from another person to put on and taking off regular lower body clothing?: A Little 6 Click Score: 20   End of Session Nurse Communication:  (draining and dizziness)  Activity Tolerance: Other (comment) (limited due to LLE draining) Patient left: in bed;with call bell/phone within reach;with bed alarm set  OT Visit  Diagnosis: Unsteadiness on feet (R26.81);Other abnormalities of gait and mobility (R26.89);Muscle weakness (generalized) (M62.81);Pain Pain - Right/Left: Left Pain - part of body: Leg                Time: 9373-4287 OT Time Calculation (min): 28 min Charges:  OT General Charges $OT Visit: 1 Visit OT Evaluation $OT Eval Low Complexity: 1 Low OT Treatments $Self Care/Home Management : 8-22 mins  Joeseph Amor OTR/L  Acute Rehab Services  [phone] office number [phone] pager number   Joeseph Amor 04/05/2022, 8:31 AM

## 2022-04-05 NOTE — Progress Notes (Signed)
Received a leaking wound vac, dressing was saturated. Charge nurse was notified and supplies for woundvac was ordered. Wound vac dressing was changed, large clotted blood was removed from the site blocking the suction, bottom black foam was not removed. Currently the woundvac is running good. Will monitor output.  04/05/22 1100  Incision (Closed) 04/04/22 Leg Left  Date First Assessed/Time First Assessed: 04/04/22 0954   Location: Leg  Location Orientation: Left  Dressing Type Negative pressure wound therapy  Dressing Changed  Site / Wound Assessment Red  Drainage Amount Moderate (large amount of clot blood was seen in the incision site.)  Negative Pressure Wound Therapy Leg Left;Lower  Placement Date: 04/04/22   Location: Leg  Location Orientation: Left;Lower  Last dressing change 04/05/22  Site / Wound Assessment Bleeding;Red  Peri-wound Assessment Erythema (blanchable)  Cycle Continuous  Target Pressure (mmHg) 125  Canister Changed No  Machine plugged into wall outlet (NOT bed outlet) Yes  Dressing Status Intact (dressing was changed as the dressing was soaked and leaking.)  Drainage Amount Moderate  Drainage Description Sanguineous

## 2022-04-05 NOTE — Progress Notes (Signed)
Patient ID: Ryan Garner, male   DOB: Jan 25, 1946, 76 y.o.   MRN: [id number] Patient is postoperative day 1 debridement chronic abscess proximal left leg.  Review of the MRI scan shows no evidence of osteomyelitis or marrow edema secondary to the abscess.  Plan for return to the operating room tomorrow Friday.  Discussed when the soft tissue is healthy and viable we will proceed with wound closure.  Cultures are pending.

## 2022-04-05 NOTE — Evaluation (Addendum)
Physical Therapy Evaluation Patient Details Name: Ryan Garner MRN: [id number] DOB: 1946-03-22 Today's Date: 04/05/2022  History of Present Illness  Pt is a 76 yr old male who presented due to chronic abscess of L proximal leg due to an injury about a year ago when his leg was pinned between a bobcat blade and a truck. Pt s/p LLE excisional debridement  PMH: afib, CHF,  DM, HLD, HTN, STEMI, rotator cuff repair  Clinical Impression  Pt agreeable to physical therapy evaluation/treatment session. Pt performing bed mobility with supervision, sit to stand with min assist, and step pivot transfer to chair with RW and CGA. Anticipate pt to progress well once pain more controlled. Pt currently presents with functional limitations secondary to impairments listed in PT problem list. Pt to benefit from skilled, acute care physical therapy interventions to maximize his independence level and quality of life. Pt scheduled for surgery again tomorrow. 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 Recommend pt obtain private caretaker for him and his wife as both of his wife's sisters (who are assisting her now) are going to be leaving on Tuesday.      Recommendations for follow up therapy are one component of a multi-disciplinary discharge planning process, led by the attending physician.  Recommendations may be updated based on patient status, additional functional criteria and insurance authorization.  Follow Up Recommendations Home health PT (pending progress)      Assistance Recommended at Discharge Intermittent Supervision/Assistance  Patient can return home with the following  A little help with walking and/or transfers;A little help with bathing/dressing/bathroom;Assistance with cooking/housework;Assist for transportation;Help with stairs or ramp for entrance    Equipment Recommendations Rolling walker (2 wheels);Other  (comment) (pending progress)  Recommendations for Other Services       Functional Status Assessment Patient has had a recent decline in their functional status and demonstrates the ability to make significant improvements in function in a reasonable and predictable amount of time.     Precautions / Restrictions Precautions Precautions: Fall Precaution Comments: wound vac LLE Restrictions Weight Bearing Restrictions: Yes LLE Weight Bearing: Weight bearing as tolerated      Mobility  Bed Mobility Overal bed mobility: Needs Assistance Bed Mobility: Supine to Sit     Supine to sit: Supervision     General bed mobility comments: Supervision for safety. Pt required use of bed rails.    Transfers Overall transfer level: Needs assistance Equipment used: Rolling walker (2 wheels) Transfers: Bed to chair/wheelchair/BSC, Sit to/from Stand Sit to Stand: Min assist, From elevated surface   Step pivot transfers: Min guard       General transfer comment: Pt performed step pivot transfer from bed to chair. Pt required cues for sequencing.    Ambulation/Gait Ambulation/Gait assistance: Min guard Gait Distance (Feet): 4 Feet Assistive device: Rolling walker (2 wheels) Gait Pattern/deviations: Step-to pattern, Antalgic Gait velocity: decreased Gait velocity interpretation: <1.31 ft/sec, indicative of household ambulator   General Gait Details: Pt ambulated short distance from bed to chair. Pt limited by painful weakness. Mild unsteadiness with no LOB. Pt required cues on how to appropriately mobilize RW with proper technique.  Stairs            Wheelchair Mobility    Modified Rankin (Stroke Patients Only)       Balance Overall balance assessment: Needs assistance   Sitting balance-Leahy Scale:  (fair to good)  Standing balance support: Reliant on assistive device for balance, Bilateral upper extremity supported Standing balance-Leahy Scale:  (poor but did not  challenge pt without using his UE's)                               Pertinent Vitals/Pain Pain Assessment Pain Assessment: 0-10 Pain Score: 8  Pain Location: LLE Pain Descriptors / Indicators: Discomfort, Sharp Pain Intervention(s): Premedicated before session, Monitored during session, Limited activity within patient's tolerance    Home Living Family/patient expects to be discharged to:: Private residence Living Arrangements: Spouse/significant other Available Help at Discharge: Friend(s);Available PRN/intermittently Type of Home: House Home Access: Level entry;Ramped entrance       Home Layout: Two level;Other (Comment) (Pt lives on first level and has stair lift to second level) Home Equipment: Cane - single point;Grab bars - toilet;Grab bars - tub/shower (stand up walker- rollator) Additional Comments: Pt has to assist his wife getting in and out of bed as well as for dressing her (her 2 sisters are helping her out currently while pt is in hospital but only here till Tuesday). 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; denies falls in last six months)             Mobility Comments: SPC use since injury (otherwise no AD at baseline)       Hand Dominance   Dominant Hand: Right    Extremity/Trunk Assessment   Upper Extremity Assessment Upper Extremity Assessment: Overall WFL for tasks assessed    Lower Extremity Assessment Lower Extremity Assessment: Generalized weakness (Grossly 4/5 R LE and 3- to 3+ L LE)    Cervical / Trunk Assessment Cervical / Trunk Assessment: Normal  Communication   Communication: No difficulties  Cognition Arousal/Alertness: Awake/alert Behavior During Therapy: WFL for tasks assessed/performed Overall Cognitive Status: Within Functional Limits for tasks assessed                                 General Comments: Pt A and O  x 4. Pt cooperative.        General Comments General comments (skin integrity, edema, etc.): HR and SpO2 stable on RA    Exercises     Assessment/Plan    PT Assessment Patient needs continued PT services  PT Problem List Decreased strength;Decreased activity tolerance;Decreased balance;Decreased mobility;Pain;Obesity;Decreased knowledge of use of DME       PT Treatment Interventions DME instruction;Gait training;Functional mobility training;Therapeutic activities;Therapeutic exercise;Balance training;Neuromuscular re-education;Patient/family education    PT Goals (Current goals can be found in the Care Plan section)  Acute Rehab PT Goals Patient Stated Goal: get up; have less pain PT Goal Formulation: With patient Time For Goal Achievement: 04/19/22 Potential to Achieve Goals: Good    Frequency Min 3X/week     Co-evaluation               AM-PAC PT "6 Clicks" Mobility  Outcome Measure Help needed turning from your back to your side while in a flat bed without using bedrails?: A Little Help needed moving from lying on your back to sitting on the side of a flat bed without using bedrails?: A Little Help needed moving to and from a bed to a chair (including a wheelchair)?: A Little Help needed standing up from a chair using your arms (  e.g., wheelchair or bedside chair)?: A Little Help needed to walk in hospital room?: A Lot Help needed climbing 3-5 steps with a railing? : A Lot 6 Click Score: 16    End of Session Equipment Utilized During Treatment: Gait belt Activity Tolerance: Patient limited by pain Patient left: in chair;with call bell/phone within reach Nurse Communication: Mobility status;Weight bearing status PT Visit Diagnosis: Other abnormalities of gait and mobility (R26.89);Pain;Muscle weakness (generalized) (M62.81) Pain - Right/Left: Left Pain - part of body: Leg    Time: 1130-1200 PT Time Calculation (min) (ACUTE ONLY): 30 min   Charges:   PT  Evaluation $PT Eval Low Complexity: 1 Low PT Treatments $Therapeutic Activity: 8-22 mins       Donna Bernard, PT   [facility] 04/05/2022, 1:01 PM

## 2022-04-05 NOTE — Consult Note (Signed)
[facility]    Date of Admission:  04/04/2022     Total days of antibiotics 2  Vancomycin  Ceftriaxone  Metronidazole          Reason for Consult: Chronic leg abscess.      Referring Provider: Sharol Given Primary Care Provider: [name], [name], MD   Assessment: Ryan Garner is a 76 y.o. male with AFIb on chronic AC, T2DM, CAD/STEMI s/p PCI admitted for chronic abscess following soft tissue injury that failed numerous rounds of antibiotics outpatient.   OR note reviewed - large deep necrotic purulent abscess above the fascia; there did not appear to be any communication with bone; MRI also reassuringly without any findings of osteomyelitis of the tibia. Intraoperative cultures are pending at this time. Will continue current regimen of Vanc, Ceftriaxone and Metronidazole until we get more information. Possible that we may not grow an organism given the duration of antibiotics he received outpatient. He has never been on any pseudomonal coverage.   Hopeful we can plan for PO antibiotics after definitive surgery; anticipate 3 weeks minimum given depth and chronicity of problem. Baseline CRP in AM if not already done.   Watch creatinine - AKI with Cr 1.7 with baseline Cr ~1.3-1.4. May be from recent course of bactrim +/- OR trip; follow up in AM if trend continues to go up will change vanco to daptomycin (unless we have any culture update).     Plan: Continue IV vanc, ceftriaxone, metronidazole  Follow Creatinine - change to daptomycin if continues to trend up (eGFR ~40)   Principal Problem:   Abscess of leg, left Active Problems:   Abscess of left lower leg    apixaban  5 mg Oral BID   vitamin C  1,000 mg Oral Daily   atorvastatin  80 mg Oral Daily   clopidogrel  75 mg Oral Q breakfast   docusate sodium  100 mg Oral Daily   furosemide  80 mg Oral Daily   metoprolol tartrate  50 mg Oral BID   metroNIDAZOLE  500 mg Oral Q12H   nutrition  supplement (JUVEN)  1 packet Oral BID BM   pantoprazole  40 mg Oral Daily   potassium chloride  20 mEq Oral Once per day on Sun Tue Thu Sat   And   [START ON 04/06/2022] potassium chloride  40 mEq Oral Q M,W,F   zinc sulfate  220 mg Oral Daily    HPI: Ryan Garner is a 76 y.o. male admitted from Orthopedic office for management of chronic left leg abscess/sinus tract.   PMHx notable for AFib on chronic AC with Eliquis, T2DM, HLT, HTN, Sleep Apnea, STEMI/CAD   Mr Graumann had a hospitalization on 8/10 of this year for management of cellulitis that did not go away on his left leg. He recalls he had a soft tissue injury to this left leg where it "had gotten caught between a machine." He had an episode of cellulitis to this left leg previously.  In August his PCP started him on Augmentin outpatient however this seemed to worsen which prompted ER visit. He received IV ceftriaxone, WBC mildly elevated 13.3K at that time. Received 3d IV antibiotics and D/C'd home on doxycycline + cefdinir to complete 7d. Has continued to see his family medicine team weekly for ongoing draining open wound.  Has since had repeated course Doxy 100 mg BID x 10d, then Cephalexin QID x7d then bactrim  x 10d.  At follow up after this he has had ongoing persistent issue.   He was referred to see Dr. Sharol Given outpatient for assistance with wound management. Cellulitis and swelling of the proximal leg with open ulcer that probed to bone in the office 2 cm in diameter. He was recommended urgent I&D with MRI imaging inpatient and is now POD1 following excision of abscess.   Mr. Drost says he is feeling well overall. No infectious symptoms prior to since his first episode of cellulitis. Specifically denies any fevers/chills. He has plans for return OR trip tomorrow 9/15.     Review of Systems: Review of Systems  Constitutional:  Negative for chills, fever, malaise/fatigue and weight loss.  Respiratory: Negative.    Cardiovascular:   Positive for leg swelling.  Gastrointestinal:  Negative for abdominal pain, diarrhea, nausea and vomiting.  Genitourinary: Negative.   Musculoskeletal:  Positive for joint pain.    Past Medical History:  Diagnosis Date   A-fib ([place])    Acute on chronic diastolic CHF (congestive heart failure) ([place]) 07/21/2020   Coronary artery disease    Diabetes mellitus without complication (H[facility])    Dyspnea    Dysrhythmia    A-fib   Hyperlipidemia    Hypertension    Metabolic syndrome    Prediabetes    Sleep apnea    STEMI (ST elevation myocardial infarction) ([place])     Social History   Tobacco Use   Smoking status: Former    Types: Cigarettes    Quit date: 04/02/1959    Years since quitting: 63.0   Smokeless tobacco: Never  Vaping Use   Vaping Use: Never used  Substance Use Topics   Alcohol use: No   Drug use: No    Family History  Problem Relation Age of Onset   Cancer Mother        lungs   Coronary artery disease Mother    Diabetes Brother    Heart attack Brother    Diabetes Maternal Grandmother    Arthritis Father    Allergies  Allergen Reactions   Flecainide     Per patient dizziness, shortness of breath, blurred vision   Rofecoxib Rash and Other (See Comments)    (Vioxx)    OBJECTIVE: Blood pressure 99/70, pulse 61, temperature (!) 97.3 F (36.3 C), temperature source Oral, resp. rate 17, height 5' 8" (1.727 m), weight 134.3 kg, SpO2 96 %.   Physical Exam Constitutional:      Appearance: Normal appearance. He is not ill-appearing.  HENT:     Mouth/Throat:     Mouth: Mucous membranes are moist.     Pharynx: Oropharynx is clear.  Eyes:     Pupils: Pupils are equal, round, and reactive to light.  Cardiovascular:     Rate and Rhythm: Normal rate. Rhythm irregular.  Pulmonary:     Effort: Pulmonary effort is normal.     Breath sounds: Normal breath sounds.  Abdominal:     General: Bowel sounds are normal.     Palpations: Abdomen is soft.  Musculoskeletal:      Comments: LLE wrapped in gauze, dried blood on ACE noted with wound vac in place.   Skin:    General: Skin is warm and dry.     Capillary Refill: Capillary refill takes more than 3 seconds.  Neurological:     Mental Status: He is alert and oriented to person, place, and time.     Lab Results Lab Results  Component Value  Date   WBC 11.8 (H) 04/05/2022   HGB 11.1 (L) 04/05/2022   HCT 33.5 (L) 04/05/2022   MCV 93.1 04/05/2022   PLT 270 04/05/2022    Lab Results  Component Value Date   CREATININE 1.81 (H) 04/05/2022   BUN 36 (H) 04/05/2022   NA 128 (L) 04/05/2022   K 4.9 04/05/2022   CL 99 04/05/2022   CO2 22 04/05/2022    Lab Results  Component Value Date   ALT 19 03/08/2022   AST 22 03/08/2022   ALKPHOS 96 03/08/2022   BILITOT 0.9 03/08/2022     Microbiology: Recent Results (from the past 240 hour(s))  Aerobic/Anaerobic Culture w Gram Stain (surgical/deep wound)     Status: None (Preliminary result)   Collection Time: 04/04/22  9:44 AM   Specimen: PATH Soft tissue  Result Value Ref Range Status   Specimen Description LEG  Final   Special Requests  LEFT DEBRIDEMENT  Final   Gram Stain NO WBC SEEN NO ORGANISMS SEEN   Final   Culture   Final    NO GROWTH < 24 HOURS Performed at [facility], [place] [address], [place], [place] [address]    Report Status PENDING  Incomplete    Janene Madeira, MSN, NP-C [facility] [email or web address] Pager: [phone] Office: [phone] [facility] Main Line: [place] Communication Welcome  12:55 PM

## 2022-04-05 NOTE — H&P (View-Only) (Signed)
Patient ID: Ryan Garner, male   DOB: Nov 12, 1945, 76 y.o.   MRN: [id number] Patient is postoperative day 1 debridement chronic abscess proximal left leg.  Review of the MRI scan shows no evidence of osteomyelitis or marrow edema secondary to the abscess.  Plan for return to the operating room tomorrow Friday.  Discussed when the soft tissue is healthy and viable we will proceed with wound closure.  Cultures are pending.

## 2022-04-06 ENCOUNTER — Encounter (HOSPITAL_COMMUNITY)
Admission: RE | Disposition: A | Discharge status: Skilled Nursing Facility | Payer: Self-pay | Source: Home / Self Care | Attending: Orthopedic Surgery

## 2022-04-06 ENCOUNTER — Other Ambulatory Visit: Payer: Self-pay

## 2022-04-06 ENCOUNTER — Encounter (HOSPITAL_COMMUNITY): Payer: Self-pay | Admitting: Orthopedic Surgery

## 2022-04-06 ENCOUNTER — Inpatient Hospital Stay (HOSPITAL_COMMUNITY): Payer: Medicare Other | Admitting: Registered Nurse

## 2022-04-06 ENCOUNTER — Inpatient Hospital Stay (HOSPITAL_COMMUNITY): Payer: Medicare Other

## 2022-04-06 DIAGNOSIS — N189 Chronic kidney disease, unspecified: Secondary | ICD-10-CM

## 2022-04-06 DIAGNOSIS — R001 Bradycardia, unspecified: Secondary | ICD-10-CM | POA: Diagnosis not present

## 2022-04-06 DIAGNOSIS — I5033 Acute on chronic diastolic (congestive) heart failure: Secondary | ICD-10-CM

## 2022-04-06 DIAGNOSIS — I25119 Atherosclerotic heart disease of native coronary artery with unspecified angina pectoris: Secondary | ICD-10-CM | POA: Diagnosis not present

## 2022-04-06 DIAGNOSIS — I4819 Other persistent atrial fibrillation: Secondary | ICD-10-CM | POA: Diagnosis not present

## 2022-04-06 DIAGNOSIS — L02416 Cutaneous abscess of left lower limb: Secondary | ICD-10-CM | POA: Diagnosis not present

## 2022-04-06 DIAGNOSIS — I4821 Permanent atrial fibrillation: Secondary | ICD-10-CM | POA: Diagnosis not present

## 2022-04-06 DIAGNOSIS — I110 Hypertensive heart disease with heart failure: Secondary | ICD-10-CM | POA: Diagnosis not present

## 2022-04-06 DIAGNOSIS — I97711 Intraoperative cardiac arrest during other surgery: Secondary | ICD-10-CM

## 2022-04-06 DIAGNOSIS — N179 Acute kidney failure, unspecified: Secondary | ICD-10-CM

## 2022-04-06 DIAGNOSIS — I959 Hypotension, unspecified: Secondary | ICD-10-CM | POA: Diagnosis not present

## 2022-04-06 DIAGNOSIS — E871 Hypo-osmolality and hyponatremia: Secondary | ICD-10-CM

## 2022-04-06 DIAGNOSIS — Z87891 Personal history of nicotine dependence: Secondary | ICD-10-CM

## 2022-04-06 DIAGNOSIS — I5030 Unspecified diastolic (congestive) heart failure: Secondary | ICD-10-CM | POA: Diagnosis present

## 2022-04-06 HISTORY — PX: I & D EXTREMITY: SHX5045

## 2022-04-06 LAB — BASIC METABOLIC PANEL
Anion gap: 11 (ref 5–15)
Anion gap: 11 (ref 5–15)
BUN: 47 mg/dL — ABNORMAL HIGH (ref 8–23)
BUN: 48 mg/dL — ABNORMAL HIGH (ref 8–23)
CO2: 21 mmol/L — ABNORMAL LOW (ref 22–32)
CO2: 22 mmol/L (ref 22–32)
Calcium: 9.1 mg/dL (ref 8.9–10.3)
Calcium: 8.9 mg/dL (ref 8.9–10.3)
Chloride: 95 mmol/L — ABNORMAL LOW (ref 98–111)
Chloride: 96 mmol/L — ABNORMAL LOW (ref 98–111)
Creatinine, Ser: 1.83 mg/dL — ABNORMAL HIGH (ref 0.61–1.24)
Creatinine, Ser: 1.84 mg/dL — ABNORMAL HIGH (ref 0.61–1.24)
GFR, Estimated: 37 mL/min — ABNORMAL LOW (ref >60)
GFR, Estimated: 37 mL/min — ABNORMAL LOW (ref >60)
Glucose, Bld: 160 mg/dL — ABNORMAL HIGH (ref 70–99)
Glucose, Bld: 126 mg/dL — ABNORMAL HIGH (ref 70–99)
Potassium: 5.5 mmol/L — ABNORMAL HIGH (ref 3.5–5.1)
Potassium: 4.8 mmol/L (ref 3.5–5.1)
Sodium: 127 mmol/L — ABNORMAL LOW (ref 135–145)
Sodium: 129 mmol/L — ABNORMAL LOW (ref 135–145)

## 2022-04-06 LAB — CBC
HCT: 33.7 % — ABNORMAL LOW (ref 39.0–52.0)
Hemoglobin: 11.4 g/dL — ABNORMAL LOW (ref 13.0–17.0)
MCH: 31.3 pg (ref 26.0–34.0)
MCHC: 33.8 g/dL (ref 30.0–36.0)
MCV: 92.6 fL (ref 80.0–100.0)
Platelets: 256 10*3/uL (ref 150–400)
RBC: 3.64 MIL/uL — ABNORMAL LOW (ref 4.22–5.81)
RDW: 14.1 % (ref 11.5–15.5)
WBC: 12.7 10*3/uL — ABNORMAL HIGH (ref 4.0–10.5)
nRBC: 0 % (ref 0.0–0.2)

## 2022-04-06 LAB — URINALYSIS, ROUTINE W REFLEX MICROSCOPIC
Bilirubin Urine: NEGATIVE
Glucose, UA: NEGATIVE mg/dL
Hgb urine dipstick: NEGATIVE
Ketones, ur: NEGATIVE mg/dL
Leukocytes,Ua: NEGATIVE
Nitrite: NEGATIVE
Protein, ur: 100 mg/dL — AB
Specific Gravity, Urine: 1.011 (ref 1.005–1.030)
pH: 5 (ref 5.0–8.0)

## 2022-04-06 LAB — GLUCOSE, CAPILLARY
Glucose-Capillary: 130 mg/dL — ABNORMAL HIGH (ref 70–99)
Glucose-Capillary: 121 mg/dL — ABNORMAL HIGH (ref 70–99)
Glucose-Capillary: 119 mg/dL — ABNORMAL HIGH (ref 70–99)

## 2022-04-06 LAB — TROPONIN I (HIGH SENSITIVITY)
Troponin I (High Sensitivity): 21 ng/L — ABNORMAL HIGH (ref <18)
Troponin I (High Sensitivity): 27 ng/L — ABNORMAL HIGH (ref <18)

## 2022-04-06 LAB — CREATININE, URINE, RANDOM: Creatinine, Urine: 51 mg/dL

## 2022-04-06 LAB — MAGNESIUM: Magnesium: 1.6 mg/dL — ABNORMAL LOW (ref 1.7–2.4)

## 2022-04-06 LAB — BRAIN NATRIURETIC PEPTIDE: B Natriuretic Peptide: 682.5 pg/mL — ABNORMAL HIGH (ref 0.0–100.0)

## 2022-04-06 LAB — MRSA NEXT GEN BY PCR, NASAL: MRSA by PCR Next Gen: NOT DETECTED

## 2022-04-06 LAB — SODIUM, URINE, RANDOM: Sodium, Ur: 14 mmol/L

## 2022-04-06 SURGERY — IRRIGATION AND DEBRIDEMENT EXTREMITY
Anesthesia: General | Site: Leg Lower | Laterality: Left

## 2022-04-06 MED ORDER — SODIUM CHLORIDE 0.9 % IR SOLN: Status: DC | PRN

## 2022-04-06 MED ORDER — MAGNESIUM CITRATE PO SOLN: 1.0000 | Freq: Once | ORAL | Status: DC | PRN

## 2022-04-06 MED ORDER — PHENYLEPHRINE 80 MCG/ML (10ML) SYRINGE FOR IV PUSH (FOR BLOOD PRESSURE SUPPORT)
PREFILLED_SYRINGE | INTRAVENOUS | Status: DC | PRN
  Administered 2022-04-06 (×2): 160 ug via INTRAVENOUS

## 2022-04-06 MED ORDER — CEFAZOLIN IN SODIUM CHLORIDE 3-0.9 GM/100ML-% IV SOLN
3.0000 g | INTRAVENOUS | Status: DC
  Filled 2022-04-06: qty 100

## 2022-04-06 MED ORDER — FUROSEMIDE 10 MG/ML IJ SOLN: 40.0000 mg | Freq: Two times a day (BID) | INTRAMUSCULAR | Status: DC

## 2022-04-06 MED ORDER — CHLORHEXIDINE GLUCONATE 0.12 % MT SOLN
15.0000 mL | Freq: Once | OROMUCOSAL | Status: AC
  Administered 2022-04-06: 15 mL via OROMUCOSAL
  Filled 2022-04-06: qty 15

## 2022-04-06 MED ORDER — AMISULPRIDE (ANTIEMETIC) 5 MG/2ML IV SOLN: 10.0000 mg | Freq: Once | INTRAVENOUS | Status: DC | PRN

## 2022-04-06 MED ORDER — EPHEDRINE SULFATE-NACL 50-0.9 MG/10ML-% IV SOSY
PREFILLED_SYRINGE | INTRAVENOUS | Status: DC | PRN
  Administered 2022-04-06: 5 mg via INTRAVENOUS
  Administered 2022-04-06 (×2): 10 mg via INTRAVENOUS

## 2022-04-06 MED ORDER — ONDANSETRON HCL 4 MG PO TABS: 4.0000 mg | ORAL_TABLET | Freq: Four times a day (QID) | ORAL | Status: DC | PRN

## 2022-04-06 MED ORDER — FENTANYL CITRATE (PF) 250 MCG/5ML IJ SOLN
INTRAMUSCULAR | Status: DC | PRN
  Administered 2022-04-06: 50 ug via INTRAVENOUS

## 2022-04-06 MED ORDER — METHOCARBAMOL 500 MG PO TABS: 500.0000 mg | ORAL_TABLET | Freq: Four times a day (QID) | ORAL | Status: DC | PRN

## 2022-04-06 MED ORDER — LACTATED RINGERS IV SOLN: INTRAVENOUS | Status: DC | PRN

## 2022-04-06 MED ORDER — LIDOCAINE HCL (CARDIAC) PF 100 MG/5ML IV SOSY
PREFILLED_SYRINGE | INTRAVENOUS | Status: DC | PRN
  Administered 2022-04-06: 60 mg via INTRAVENOUS

## 2022-04-06 MED ORDER — ACETAMINOPHEN 10 MG/ML IV SOLN: 1000.0000 mg | Freq: Once | INTRAVENOUS | Status: DC | PRN

## 2022-04-06 MED ORDER — ORAL CARE MOUTH RINSE: 15.0000 mL | Freq: Once | OROMUCOSAL | Status: AC

## 2022-04-06 MED ORDER — VASOPRESSIN 20 UNIT/ML IV SOLN
INTRAVENOUS | Status: DC | PRN
  Administered 2022-04-06 (×2): 10 [IU] via INTRAVENOUS

## 2022-04-06 MED ORDER — FENTANYL CITRATE (PF) 100 MCG/2ML IJ SOLN
INTRAMUSCULAR | Status: AC
  Filled 2022-04-06: qty 2

## 2022-04-06 MED ORDER — DOCUSATE SODIUM 100 MG PO CAPS
100.0000 mg | ORAL_CAPSULE | Freq: Two times a day (BID) | ORAL | Status: DC
  Administered 2022-04-06 – 2022-04-13 (×11): 100 mg via ORAL
  Filled 2022-04-06 (×15): qty 1

## 2022-04-06 MED ORDER — METHOCARBAMOL 1000 MG/10ML IJ SOLN: 500.0000 mg | Freq: Four times a day (QID) | INTRAVENOUS | Status: DC | PRN

## 2022-04-06 MED ORDER — SODIUM CHLORIDE 0.9 % IV SOLN: INTRAVENOUS | Status: DC

## 2022-04-06 MED ORDER — POLYETHYLENE GLYCOL 3350 17 G PO PACK
17.0000 g | PACK | Freq: Every day | ORAL | Status: DC | PRN
  Administered 2022-04-06 – 2022-04-09 (×2): 17 g via ORAL
  Filled 2022-04-06: qty 1

## 2022-04-06 MED ORDER — INSULIN ASPART 100 UNIT/ML IJ SOLN: 0.0000 [IU] | INTRAMUSCULAR | Status: DC | PRN

## 2022-04-06 MED ORDER — GLYCOPYRROLATE 0.2 MG/ML IJ SOLN
INTRAMUSCULAR | Status: DC | PRN
  Administered 2022-04-06: .2 mg via INTRAVENOUS

## 2022-04-06 MED ORDER — ONDANSETRON HCL 4 MG/2ML IJ SOLN: 4.0000 mg | Freq: Once | INTRAMUSCULAR | Status: DC | PRN

## 2022-04-06 MED ORDER — VASOPRESSIN 20 UNIT/ML IV SOLN
INTRAVENOUS | Status: AC
  Filled 2022-04-06: qty 1

## 2022-04-06 MED ORDER — CHLORHEXIDINE GLUCONATE 4 % EX LIQD: 60.0000 mL | Freq: Once | CUTANEOUS | Status: DC

## 2022-04-06 MED ORDER — BISACODYL 10 MG RE SUPP: 10.0000 mg | Freq: Every day | RECTAL | Status: DC | PRN

## 2022-04-06 MED ORDER — PROPOFOL 10 MG/ML IV BOLUS
INTRAVENOUS | Status: DC | PRN
  Administered 2022-04-06: 150 mg via INTRAVENOUS

## 2022-04-06 MED ORDER — ONDANSETRON HCL 4 MG/2ML IJ SOLN: 4.0000 mg | Freq: Four times a day (QID) | INTRAMUSCULAR | Status: DC | PRN

## 2022-04-06 MED ORDER — FUROSEMIDE 10 MG/ML IJ SOLN
40.0000 mg | Freq: Two times a day (BID) | INTRAMUSCULAR | Status: DC
  Administered 2022-04-06 – 2022-04-07 (×2): 40 mg via INTRAVENOUS
  Filled 2022-04-06 (×2): qty 4

## 2022-04-06 MED ORDER — METOCLOPRAMIDE HCL 5 MG PO TABS: 5.0000 mg | ORAL_TABLET | Freq: Three times a day (TID) | ORAL | Status: DC | PRN

## 2022-04-06 MED ORDER — EPINEPHRINE 1 MG/10ML IJ SOSY
PREFILLED_SYRINGE | INTRAMUSCULAR | Status: AC
  Filled 2022-04-06: qty 10

## 2022-04-06 MED ORDER — FENTANYL CITRATE (PF) 250 MCG/5ML IJ SOLN
INTRAMUSCULAR | Status: AC
  Filled 2022-04-06: qty 5

## 2022-04-06 MED ORDER — FENTANYL CITRATE (PF) 100 MCG/2ML IJ SOLN
25.0000 ug | INTRAMUSCULAR | Status: DC | PRN
  Administered 2022-04-06: 25 ug via INTRAVENOUS

## 2022-04-06 MED ORDER — EPINEPHRINE 1 MG/10ML IJ SOSY: PREFILLED_SYRINGE | INTRAMUSCULAR | Status: DC | PRN

## 2022-04-06 MED ORDER — POVIDONE-IODINE 10 % EX SWAB: 2.0000 | Freq: Once | CUTANEOUS | Status: DC

## 2022-04-06 MED ORDER — CHLORHEXIDINE GLUCONATE CLOTH 2 % EX PADS
6.0000 | MEDICATED_PAD | Freq: Every day | CUTANEOUS | Status: DC
  Administered 2022-04-06 – 2022-04-13 (×8): 6 via TOPICAL

## 2022-04-06 MED ORDER — 0.9 % SODIUM CHLORIDE (POUR BTL) OPTIME
TOPICAL | Status: DC | PRN
  Administered 2022-04-06: 1000 mL

## 2022-04-06 MED ORDER — LACTATED RINGERS IV SOLN: INTRAVENOUS | Status: DC

## 2022-04-06 MED ORDER — METOCLOPRAMIDE HCL 5 MG/ML IJ SOLN: 5.0000 mg | Freq: Three times a day (TID) | INTRAMUSCULAR | Status: DC | PRN

## 2022-04-06 SURGICAL SUPPLY — 39 items
BAG COUNTER SPONGE SURGICOUNT (BAG) IMPLANT
BLADE SURG 21 STRL SS (BLADE) ×1 IMPLANT
BNDG COHESIVE 4X5 TAN ST LF (GAUZE/BANDAGES/DRESSINGS) IMPLANT
BNDG COHESIVE 6X5 TAN STRL LF (GAUZE/BANDAGES/DRESSINGS) IMPLANT
BNDG GAUZE DERMACEA FLUFF 4 (GAUZE/BANDAGES/DRESSINGS) ×2 IMPLANT
CANISTER WOUNDNEG PRESSURE 500 (CANNISTER) IMPLANT
COVER SURGICAL LIGHT HANDLE (MISCELLANEOUS) ×2 IMPLANT
DRAPE U-SHAPE 47X51 STRL (DRAPES) ×1 IMPLANT
DRESSING PEEL AND PLC PRVNA 13 (GAUZE/BANDAGES/DRESSINGS) IMPLANT
DRESSING VERAFLO CLEANS CC MED (GAUZE/BANDAGES/DRESSINGS) IMPLANT
DRSG ADAPTIC 3X8 NADH LF (GAUZE/BANDAGES/DRESSINGS) ×1 IMPLANT
DRSG PEEL AND PLACE PREVENA 13 (GAUZE/BANDAGES/DRESSINGS) ×1
DRSG VERAFLO CLEANSE CC MED (GAUZE/BANDAGES/DRESSINGS) ×1
DURAPREP 26ML APPLICATOR (WOUND CARE) ×1 IMPLANT
ELECT REM PT RETURN 9FT ADLT (ELECTROSURGICAL)
ELECTRODE REM PT RTRN 9FT ADLT (ELECTROSURGICAL) IMPLANT
GAUZE SPONGE 4X4 12PLY STRL (GAUZE/BANDAGES/DRESSINGS) ×1 IMPLANT
GLOVE BIOGEL PI IND STRL 9 (GLOVE) ×1 IMPLANT
GLOVE SURG ORTHO 9.0 STRL STRW (GLOVE) ×1 IMPLANT
GOWN STRL REUS W/ TWL XL LVL3 (GOWN DISPOSABLE) ×2 IMPLANT
GOWN STRL REUS W/TWL XL LVL3 (GOWN DISPOSABLE) ×2
GRAFT SKIN WND MICRO 38 (Tissue) IMPLANT
GRAFT SKIN WND SURGIBIND 3X7 (Tissue) IMPLANT
HANDPIECE INTERPULSE COAX TIP (DISPOSABLE)
KIT BASIN OR (CUSTOM PROCEDURE TRAY) ×1 IMPLANT
KIT TURNOVER KIT B (KITS) ×1 IMPLANT
MANIFOLD NEPTUNE II (INSTRUMENTS) ×1 IMPLANT
NS IRRIG 1000ML POUR BTL (IV SOLUTION) ×1 IMPLANT
PACK ORTHO EXTREMITY (CUSTOM PROCEDURE TRAY) ×1 IMPLANT
PAD ARMBOARD 7.5X6 YLW CONV (MISCELLANEOUS) ×2 IMPLANT
PAD NEG PRESSURE SENSATRAC (MISCELLANEOUS) IMPLANT
SET HNDPC FAN SPRY TIP SCT (DISPOSABLE) IMPLANT
STOCKINETTE IMPERVIOUS 9X36 MD (GAUZE/BANDAGES/DRESSINGS) IMPLANT
SUT ETHILON 2 0 PSLX (SUTURE) ×1 IMPLANT
SWAB COLLECTION DEVICE MRSA (MISCELLANEOUS) ×1 IMPLANT
SWAB CULTURE ESWAB REG 1ML (MISCELLANEOUS) IMPLANT
TOWEL GREEN STERILE (TOWEL DISPOSABLE) ×1 IMPLANT
TUBE CONNECTING 12X1/4 (SUCTIONS) ×1 IMPLANT
YANKAUER SUCT BULB TIP NO VENT (SUCTIONS) ×1 IMPLANT

## 2022-04-06 NOTE — Consult Note (Addendum)
Cardiology Consultation   Patient ID: Ryan Garner MRN: [id number]; DOB: 24-Sep-1945  Admit date: 04/04/2022 Date of Consult: 04/06/2022  PCP:  [name], [name], MD   [facility] Cardiologist:  Dorris Carnes, MD       Patient Profile:   Ryan Garner is a 76 y.o. male with a hx of atrial fibrillation on chronic anticoagulation with Eliquis, type 2 diabetes, hypertension, hyperlipidemia, sleep apnea, history of CAD from STEMI in 2021 where he underwent DES to the left circumflex artery who is being seen 04/06/2022 for the evaluation of bradycardia at the request of Dr. Tamala Julian.  History of Present Illness:   Ryan Garner patient was admitted to [facility] for left leg cellulitis.  He has been treated on the orthopedic team with oral antibiotic and has also underwent surgical procedures for abscess excision.  He is being managed currently with antibiotic postoperative.  And has been seen by infectious disease as well.  While intraoperative yesterday immediately after intubation he was reported to be bradycardic and hypotensive.  There is no mention of any loss of pulses.  He underwent resuscitation and responded very well.  The team then elected to proceed with the debridement surgery which he tolerated.  Postop his blood pressure has been on the lower end averaging in the 90s.  Heart rate also has been averaging 60 to 80s in atrial fibrillation.  He was referred by Dr. Tamala Julian due to bradycardia and hypotensive intraoperatively.  Patient was seen examined his bedside.  He reports that he is having some chest wall pain but no other complaints at this time.  Past Medical History:  Diagnosis Date   A-fib [facility])    Acute on chronic diastolic CHF (congestive heart failure) ([place]) 07/21/2020   Coronary artery disease    Diabetes mellitus without complication (HCC)    Dyspnea    Dysrhythmia    A-fib   Hyperlipidemia    Hypertension    Metabolic syndrome     Prediabetes    Sleep apnea    STEMI (ST elevation myocardial infarction) [facility])     Past Surgical History:  Procedure Laterality Date   APPENDECTOMY     CHOLECYSTECTOMY N/A 03/02/2020   Procedure: LAPAROSCOPIC CHOLECYSTECTOMY;  Surgeon: Aviva Signs, MD;  Location: [facility];  Service: General;  Laterality: N/A;   CORONARY STENT INTERVENTION N/A 04/30/2020   Procedure: CORONARY STENT INTERVENTION;  Surgeon: Martinique, Peter M, MD;  Location: [place] CV LAB;  Service: Cardiovascular;  Laterality: N/A;   CORONARY/GRAFT ACUTE MI REVASCULARIZATION N/A 04/30/2020   Procedure: Coronary/Graft Acute MI Revascularization;  Surgeon: Martinique, Peter M, MD;  Location: [place] CV LAB;  Service: Cardiovascular;  Laterality: N/A;   Eyelid Surgery Bilateral    HERNIA REPAIR     I & D EXTREMITY Left 04/04/2022   Procedure: LEFT LEG DEBRIDEMENT;  Surgeon: Newt Minion, MD;  Location: [place];  Service: Orthopedics;  Laterality: Left;   knee tendons repair     LEFT HEART CATH AND CORONARY ANGIOGRAPHY N/A 04/30/2020   Procedure: LEFT HEART CATH AND CORONARY ANGIOGRAPHY;  Surgeon: Martinique, Peter M, MD;  Location: [place] CV LAB;  Service: Cardiovascular;  Laterality: N/A;   ROTATOR CUFF REPAIR Bilateral    TONSILLECTOMY       Inpatient Medications: Scheduled Meds:  apixaban  5 mg Oral BID   vitamin C  1,000 mg Oral Daily   atorvastatin  80 mg Oral Daily   Chlorhexidine Gluconate  Cloth  6 each Topical Daily   clopidogrel  75 mg Oral Q breakfast   docusate sodium  100 mg Oral Daily   docusate sodium  100 mg Oral BID   furosemide  40 mg Intravenous BID   metroNIDAZOLE  500 mg Oral Q12H   nutrition supplement (JUVEN)  1 packet Oral BID BM   pantoprazole  40 mg Oral Daily   potassium chloride  20 mEq Oral Once per day on Sun Tue Thu Sat   And   potassium chloride  40 mEq Oral Q M,W,F   zinc sulfate  220 mg Oral Daily   Continuous Infusions:  sodium chloride Stopped (04/05/22 1447)   sodium chloride      cefTRIAXone (ROCEPHIN)  IV 0 g (04/05/22 1455)   magnesium sulfate bolus IVPB     methocarbamol (ROBAXIN) IV     vancomycin 1,000 mg (04/05/22 1632)   PRN Meds: acetaminophen, alum & mag hydroxide-simeth, bisacodyl, bisacodyl, guaiFENesin-dextromethorphan, hydrALAZINE, HYDROmorphone (DILAUDID) injection, labetalol, magnesium citrate, magnesium citrate, magnesium sulfate bolus IVPB, methocarbamol **OR** methocarbamol (ROBAXIN) IV, metoCLOPramide **OR** metoCLOPramide (REGLAN) injection, metoprolol tartrate, ondansetron, ondansetron **OR** ondansetron (ZOFRAN) IV, oxyCODONE, oxyCODONE, phenol, polyethylene glycol, polyethylene glycol, potassium chloride  Allergies:    Allergies  Allergen Reactions   Flecainide     Per patient dizziness, shortness of breath, blurred vision   Rofecoxib Rash and Other (See Comments)    (Vioxx)    Social History:   Social History   Socioeconomic History   Marital status: Married    Spouse name: [name]   Number of children: 2   Years of education: 20   Highest education level: Doctorate  Occupational History   Occupation: Theme park manager    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  Other Topics Concern   Not on file  Social History Narrative   Not on file   Social Determinants of Health   Financial Resource Strain: Low Risk  (03/06/2022)   Overall Financial Resource Strain (CARDIA)    Difficulty of Paying Living Expenses: Not hard at all  Food Insecurity: No Food Insecurity (04/05/2022)   Hunger Vital Sign    Worried About Running Out of Food in the Last Year: Never true    [place] in the Last Year: Never true  Transportation  Needs: No Transportation Needs (04/05/2022)   PRAPARE - Hydrologist (Medical): No    Lack of Transportation (Non-Medical): No  Physical Activity: Inactive (09/25/2021)   Exercise Vital Sign    Days of Exercise per Week: 0 days    Minutes of Exercise per Session: 0 min  Stress: Stress Concern Present (09/25/2021)   [place]    Feeling of Stress : To some extent  Social Connections: Socially Integrated (07/03/2021)   Social Connection and Isolation Panel [NHANES]    Frequency of Communication with Friends and Family: Twice a week    Frequency of Social Gatherings with Friends and Family: Twice a week    Attends Religious Services: More than 4 times  per year    Active Member of Clubs or Organizations: Yes    Attends Archivist Meetings: More than 4 times per year    Marital Status: Married  Human resources officer Violence: Not At Risk (04/05/2022)   Humiliation, Afraid, Rape, and Kick questionnaire    Fear of Current or Ex-Partner: No    Emotionally Abused: No    Physically Abused: No    Sexually Abused: No    Family History:    Family History  Problem Relation Age of Onset   Cancer Mother        lungs   Coronary artery disease Mother    Diabetes Brother    Heart attack Brother    Diabetes Maternal Grandmother    Arthritis Father      ROS:  Please see the history of present illness.  Reports some chest wall tightness. All other ROS reviewed and negative.     Physical Exam/Data:   Vitals:   04/06/22 1330 04/06/22 1345 04/06/22 1400 04/06/22 1415  BP: (!) '93/54 92/60 93/62 '$ 92/60  Pulse: 78 81 70 67  Resp: 13 20 (!) 9 (!) 9  Temp:      TempSrc:      SpO2: 99% 98% 98% 99%  Weight:      Height:        Intake/Output Summary (Last 24 hours) at 04/06/2022 1452 Last data filed at 04/06/2022 1200 Gross per 24 hour  Intake 1061.19 ml  Output 850 ml  Net 211.19 ml       04/06/2022    8:33 AM 04/04/2022    7:31 AM 03/30/2022    1:56 PM  Last 3 Weights  Weight (lbs) 296 lb 296 lb 302 lb  Weight (kg) 134.265 kg 134.265 kg 136.986 kg     Body mass index is 45.01 kg/m.  General:  Well nourished, well developed, in no acute distress HEENT: normal Neck: no JVD Vascular: No carotid bruits; Distal pulses 2+ bilaterally Cardiac:  normal S1, S2; RRR; no murmur  Lungs:  clear to auscultation bilaterally, no wheezing, rhonchi or rales  Abd: soft, nontender, no hepatomegaly  Ext: no edema Musculoskeletal:  No deformities, BUE and BLE strength normal and equal Skin: warm and dry  Neuro:  CNs 2-12 intact, no focal abnormalities noted Psych:  Normal affect   EKG:  The EKG was personally reviewed and demonstrates:   Telemetry:  Telemetry was personally reviewed and demonstrates:    Relevant CV Studies:  TTE 05/2021 IMPRESSIONS     1. Left ventricular ejection fraction, by estimation, is 60 to 65%. The  left ventricle has normal function. The left ventricle has no regional  wall motion abnormalities. There is moderate left ventricular hypertrophy.  Left ventricular diastolic  parameters are indeterminate.   2. Ventricular septum if flattened in systole suggesting RV pressure  overload. . Right ventricular systolic function is mildly reduced. The  right ventricular size is moderately enlarged. There is severely elevated  pulmonary artery systolic pressure.   3. Left atrial size was moderately dilated.   4. Right atrial size was moderately dilated.   5. The mitral valve is abnormal. Mild to moderate mitral valve  regurgitation. No evidence of mitral stenosis.   6. The tricuspid valve is abnormal. Tricuspid valve regurgitation is  moderate.   7. Mild to moderate aortic stenosis. Lower than expected gradient may be  due to decreased SVI.Marland Kitchen The aortic valve is tricuspid. There is moderate  calcification of the aortic valve.  There is moderate thickening of the   aortic valve. Aortic valve  regurgitation is not visualized. Mild to moderate aortic valve stenosis.   FINDINGS   Left Ventricle: Left ventricular ejection fraction, by estimation, is 60  to 65%. The left ventricle has normal function. The left ventricle has no  regional wall motion abnormalities. The left ventricular internal cavity  size was normal in size. There is   moderate left ventricular hypertrophy. Left ventricular diastolic  parameters are indeterminate.   Right Ventricle: Ventricular septum if flattened in systole suggesting RV  pressure overload. The right ventricular size is moderately enlarged.  Right vetricular wall thickness was not well visualized. Right ventricular  systolic function is mildly  reduced. There is severely elevated pulmonary artery systolic pressure.  The tricuspid regurgitant velocity is 4.46 m/s, and with an assumed right  atrial pressure of 8 mmHg, the estimated right ventricular systolic  pressure is 40.1 mmHg.   Left Atrium: Left atrial size was moderately dilated.   Right Atrium: Right atrial size was moderately dilated.   Pericardium: There is no evidence of pericardial effusion.   Mitral Valve: MR is eccentric and severity may be underestimated. The  mitral valve is abnormal. There is mild thickening of the mitral valve  leaflet(s). There is mild calcification of the mitral valve leaflet(s).  Mild mitral annular calcification. Mild  to moderate mitral valve regurgitation. No evidence of mitral valve  stenosis.   Tricuspid Valve: The tricuspid valve is abnormal. Tricuspid valve  regurgitation is moderate . No evidence of tricuspid stenosis.   Aortic Valve: Mild to moderate aortic stenosis. Lower than expected  gradient may be due to decreased SVI. The aortic valve is tricuspid. There  is moderate calcification of the aortic valve. There is moderate  thickening of the aortic valve. There is  moderate aortic valve annular calcification.  Aortic valve regurgitation is  not visualized. Mild to moderate aortic stenosis is present. Aortic valve  mean gradient measures 16.7 mmHg. Aortic valve peak gradient measures 29.5  mmHg. Aortic valve area, by  VTI measures 1.21 cm.   Pulmonic Valve: The pulmonic valve was not well visualized. Pulmonic valve  regurgitation is not visualized. No evidence of pulmonic stenosis.   Aorta: The aortic root is normal in size and structure.   IAS/Shunts: No atrial level shunt detected by color flow Doppler.      Laboratory Data:  High Sensitivity Troponin:   Recent Labs  Lab 04/06/22 1253  TROPONINIHS 21*     Chemistry Recent Labs  Lab 04/05/22 0106 04/06/22 0112 04/06/22 1253  NA 128* 127* 129*  K 4.9 5.5* 4.8  CL 99 95* 96*  CO2 22 21* 22  GLUCOSE 173* 160* 126*  BUN 36* 47* 48*  CREATININE 1.81* 1.83* 1.84*  CALCIUM 8.6* 9.1 8.9  MG  --   --  1.6*  GFRNONAA 38* 37* 37*  ANIONGAP '7 11 11    '$ No results for input(s): "PROT", "ALBUMIN", "AST", "ALT", "ALKPHOS", "BILITOT" in the last 168 hours. Lipids No results for input(s): "CHOL", "TRIG", "HDL", "LABVLDL", "LDLCALC", "CHOLHDL" in the last 168 hours.  Hematology Recent Labs  Lab 04/05/22 0106 04/06/22 0112  WBC 11.8* 12.7*  RBC 3.60* 3.64*  HGB 11.1* 11.4*  HCT 33.5* 33.7*  MCV 93.1 92.6  MCH 30.8 31.3  MCHC 33.1 33.8  RDW 14.0 14.1  PLT 270 256   Thyroid No results for input(s): "TSH", "FREET4" in the last 168 hours.  BNP Recent Labs  Lab 04/06/22 1253  BNP 682.5*    DDimer No results for input(s): "DDIMER" in the last 168 hours.   Radiology/Studies:  DG CHEST PORT 1 VIEW  Result Date: 04/06/2022 CLINICAL DATA:  Respiratory abnormalities. Patient coded in the OR and now has chest pain with shortness of breath. EXAM: PORTABLE CHEST 1 VIEW COMPARISON:  03/01/2022 FINDINGS: Lungs are hypoinflated demonstrate airspace opacification over the right mid to lower lung which may be due to atelectasis or  infection. Possible small amount right pleural fluid. Mild stable cardiomegaly. Remainder of the exam is unchanged. IMPRESSION: 1. Hazy opacification over the right mid to lower lung which may be due to atelectasis or infection. Possible small amount right pleural fluid. 2. Mild stable cardiomegaly. Electronically Signed   By: Marin Olp M.D.   On: 04/06/2022 11:41   MR TIBIA FIBULA LEFT WO CONTRAST  Result Date: 04/05/2022 CLINICAL DATA:  Soft tissue infection. Recent debridement 04/04/2022. EXAM: MRI OF LOWER LEFT EXTREMITY WITHOUT CONTRAST TECHNIQUE: Multiplanar, multisequence MR imaging of the left lower leg was performed. No intravenous contrast was administered. COMPARISON:  None Available. FINDINGS: Bones/Joint/Cartilage No fracture or dislocation. Normal alignment. No joint effusion. No marrow signal abnormality. Ligaments Collateral ligaments are intact. Muscles and Tendons Flexor, peroneal and extensor compartment tendons are intact. Atrophy of the distal medial gastrocnemius muscle. Mild atrophy of the remainder of the left lower leg muscles. Soft tissue 13.3 x 4 x 3 cm soft tissue wound involving the proximal anterior left lower leg with an overlying wound VAC. Heterogeneous fluid collection measuring 4 x 3 cm at the site of debridement extending down to the anterior compartment fascia with small low signal foci likely reflecting small amount hemorrhagic fluid. Surrounding soft tissue edema in the subcutaneous fat which may reflect postsurgical changes versus cellulitis. No soft tissue mass. Generalized soft tissue edema throughout bilateral lower legs likely secondary to fluid overload IMPRESSION: 1. 13.3 x 4 x 3 cm soft tissue wound involving the proximal anterior left lower leg with an overlying wound VAC. Heterogeneous fluid collection measuring 4 x 3 cm at the site of debridement extending down to the anterior compartment fascia with small low signal foci likely reflecting small amount  hemorrhagic fluid. Concomitant infected fluid cannot be entirely excluded but given the recent surgery this is likely postsurgical. Surrounding soft tissue edema in the subcutaneous fat which may reflect postsurgical changes versus cellulitis. Electronically Signed   By: Kathreen Devoid M.D.   On: 04/05/2022 07:30     Assessment and Plan:   Intraoperative cardiac event - was hypotensive and tachycardic during his procedure clear if he did lost pulse but underwent timely resuscitation and to allow completion of his procedure.  He currently is with low systolic blood pressure but denies any significant symptoms.  His heart rate appears to be rate controlled in atrial fibrillation.  Unclear what caused his event likely could have been medication induced.  But we will get an echocardiogram to assess for any structural abnormalities.  He had troponin done which was 21 please do not trend anymore as the patient does not report any symptoms that may be consistent with angina naturally if he had any chest wall compressions yesterday his troponin will be elevated.  We will get some more guidance from his echocardiogram.  In terms of his hypotension if his blood pressure does not improve and become symptomatic could start midodrine.  Permanent atrial fibrillation continue his anticoagulation.  We will hold off on his AV nodal  blockers for now. Hyperlipidemia continue his statin. Chronic kidney disease avoid nephrotoxins this is being managed by the primary team. Normocytic anemia versus anemia of acute blood loss in the setting of recent surgery-this is being managed by the primary team hemoglobin is 11.4 today.  Will need a follow-up hemoglobin. Left leg cellulitis-surgical intervention by orthopedics antibiotics is also being managed by infectious disease.  CRITICAL CARE Performed by: Berniece Salines  Total critical care time: 30 minutes. Critical care time was exclusive of separately billable procedures and  treating other patients. Critical care was necessary to treat or prevent imminent or life-threatening deterioration. Critical care was time spent personally by me on the following activities: development of treatment plan with patient and/or surrogate as well as nursing, discussions with consultants, evaluation of patient's response to treatment, examination of patient, obtaining history from patient or surrogate, ordering and performing treatments and interventions, ordering and review of laboratory studies, ordering and review of radiographic studies, pulse oximetry and re-evaluation of patient's condition.    Signed, Berniece Salines, DO [place]  04/06/2022 4:07 PM   Risk Assessment/Risk Scores:          CHA2DS2-VASc Score =     This indicates a  % annual risk of stroke. The patient's score is based upon:           For questions or updates, please contact [place] Please consult [email or web address] for contact info under    Signed, Berniece Salines, DO  04/06/2022 2:52 PM

## 2022-04-06 NOTE — Op Note (Signed)
04/06/2022  10:47 AM  PATIENT:  Ryan Garner    PRE-OPERATIVE DIAGNOSIS:  Abscess Left Proximal Tibia  POST-OPERATIVE DIAGNOSIS:  Same  PROCEDURE:  LEFT LEG DEBRIDEMENT Application of Kerecis tissue graft with 38 cm micro powder and 3 x 7 cm sheet. Local tissue rearrangement for wound closure 10 x 5 cm. Application of cleanse choice wound VAC sponge and 13 cm Prevena sponge.  SURGEON:  Newt Minion, MD  PHYSICIAN ASSISTANT:None ANESTHESIA:   General  PREOPERATIVE INDICATIONS:  [name] is a  76 y.o. male with a diagnosis of Abscess Left Proximal Tibia who failed conservative measures and elected for surgical management.    The risks benefits and alternatives were discussed with the patient preoperatively including but not limited to the risks of infection, bleeding, nerve injury, cardiopulmonary complications, the need for revision surgery, among others, and the patient was willing to proceed.  OPERATIVE IMPLANTS: Kerecis micro powder 38 cm and Kerecis sheet 3 x 7 cm.  '@ENCIMAGES'$ @  OPERATIVE FINDINGS: Tissue margins were healthy and viable the areas of the 2 abscess had to be further excised leaving a larger wound.  No signs of any deep infection good granulation tissue in the wound bed.  OPERATIVE PROCEDURE: Patient was brought to the operating room and underwent a general anesthetic.  Immediately after intubation patient became bradycardic and hypotensive.  Anesthesia proceeded with resuscitation and patient responded well.  We then elected to proceed with the debridement surgery.  The left lower extremity was prepped using DuraPrep draped into a sterile field a timeout was called.  The wound sponge was removed there was further necrotic tissue medially and laterally this was surgically excised this left a wound that was 10 x 5 cm.  The remaining wound bed had healthy tissue the fascia was incised there was no deep infection deep to the fascia.  The wound was irrigated with  normal saline.  The wound was then filled with 38 cm of micro powder for surface area of 100 cm.  There was undermining beneath the wound.  The wound was then closed with local tissue rearrangement and undermining to perform closure of the 10 x 5 cm wound with 2-0 nylon.  There was 1 remaining open wound that was filled with the 3 x 7 cm sheet.  This was covered with a cleanse choice sponge covered with the 13 cm Prevena sponge sterile dressing was applied patient was extubated anticipate transfer to the ICU.   DISCHARGE PLANNING:  Antibiotic duration: Continue IV antibiotics  Weightbearing: Weightbearing as tolerated  Pain medication: Continue opioid pathway  Dressing care/ Wound VAC: Continue wound VAC  Ambulatory devices: Crutches walker  Discharge to: ICU condition stable  Follow-up: In the office 1 week post operative.

## 2022-04-06 NOTE — Anesthesia Procedure Notes (Signed)
Procedure Name: LMA Insertion Date/Time: 04/06/2022 10:17 AM  Performed by: Sharee Pimple, CRNAPre-anesthesia Checklist: Patient identified, Suction available, Patient being monitored and Emergency Drugs available Patient Re-evaluated:Patient Re-evaluated prior to induction Oxygen Delivery Method: Circle system utilized Preoxygenation: Pre-oxygenation with 100% oxygen Induction Type: IV induction LMA: LMA inserted LMA Size: 5.0 Comments: LMA placed without difficulty 250-300 TV with less than 40mhg after 162mof air placed in LMA

## 2022-04-06 NOTE — Anesthesia Procedure Notes (Signed)
Procedure Name: Intubation Date/Time: 04/06/2022 10:19 AM  Performed by: Sharee Pimple, CRNAPre-anesthesia Checklist: Patient identified, Emergency Drugs available, Suction available and Patient being monitored Patient Re-evaluated:Patient Re-evaluated prior to induction Oxygen Delivery Method: Circle system utilized Induction Type: IV induction Laryngoscope Size: Mac and 3 Grade View: Grade I Tube type: Oral Number of attempts: 1 Airway Equipment and Method: Stylet Placement Confirmation: ETT inserted through vocal cords under direct vision, positive ETCO2, CO2 detector and breath sounds checked- equal and bilateral Secured at: 22 cm Tube secured with: Tape Comments: Intubated pt with CPR in progress. Grade IIb view. +EtCO2 and bilateral breath sounds.

## 2022-04-06 NOTE — Progress Notes (Signed)
Pt admitted to [facility] from [place].  Pt placed on telemetry and CCMD notified. Pt is A&O X4 and neuro intact.  Vitals taken.  Pt is currently comfortable and not in pain.  Pt oriented to unit and call light in reach.    04/06/22 1901  Vitals  Temp 97.8 F (36.6 C)  Temp Source Oral  BP (!) 96/58  MAP (mmHg) 71  BP Location Right Arm  BP Method Automatic  Patient Position (if appropriate) Lying  Pulse Rate 77  Pulse Rate Source Monitor  ECG Heart Rate 66  Resp 16  Level of Consciousness  Level of Consciousness Alert  Oxygen Therapy  SpO2 98 %  O2 Device Nasal Cannula  O2 Flow Rate (L/min) 2 L/min  Pain Assessment  Pain Scale 0-10  Pain Score 3  Patients Stated Pain Goal 2  Pain Location Chest  Pain Orientation Left  Pain Descriptors / Indicators Aching  Pain Frequency Constant  Pain Onset On-going  Multiple Pain Sites No  POSS Scale ([place] Opioid Sedation Scale)  POSS *See Group Information* 1-Acceptable,Awake and alert  Glasgow Coma Scale  Eye Opening 4  Best Verbal Response (NON-intubated) 5  Best Motor Response 6  Glasgow Coma Scale Score 15  MEWS Score  MEWS Temp 0  MEWS Systolic 1  MEWS Pulse 0  MEWS RR 0  MEWS LOC 0  MEWS Score 1  MEWS Score Color Green

## 2022-04-06 NOTE — Transfer of Care (Signed)
Immediate Anesthesia Transfer of Care Note  Patient: [name]  Procedure(s) Performed: LEFT LEG DEBRIDEMENT (Left: Leg Lower)  Patient Location: PACU  Anesthesia Type:General  Level of Consciousness: awake and alert  Airway & Oxygen Therapy: Patient Spontanous Breathing and Patient connected to face mask oxygen  Post-op Assessment: Report given to RN  Post vital signs: Reviewed and stable  Last Vitals:  Vitals Value Taken Time  BP 115/66 04/06/22 1100  Temp 37 C 04/06/22 1050  Pulse 83 04/06/22 1106  Resp 18 04/06/22 1106  SpO2 96 % 04/06/22 1106  Vitals shown include unvalidated device data.  Last Pain:  Vitals:   04/06/22 1050  TempSrc: Oral  PainSc:       Patients Stated Pain Goal: 2 (74/25/95 6387)  Complications: No notable events documented.VSS in PACU. Pt opens eyes to commands

## 2022-04-06 NOTE — Consult Note (Addendum)
Initial Consultation Note   Patient: Ryan Garner XFG:[id number] DOB: 12/14/1945 PCP: [name], [name], MD DOA: 04/04/2022 DOS: the patient was seen and examined on 04/06/2022 Primary service: Newt Minion, MD  Referring physician: Meridee Score, MD Reason for consult: medical management  Assessment/Plan: Assessment and Plan:  Left leg cellulitis with abscess Patient was taken for I&D by Dr. Sharol Given today.  Patient has been on empiric antibiotics of vancomycin, cefepime, and metronidazole.  Initial cultures from left leg debridement from 9/13 show no growth at 2 days.  MRSA PCR screen was negative.  Wound VAC currently in place.  ID on board who recommended continuing IV vancomycin, Rocephin, and metronidazole.  Was recommended to possibly changed to daptomycin and kidney function did not appear to be improving. -Per Dr. Sharol Given of orthopedics -ID recommended switching to daptomycin if any function did not improve  Leukocytosis WBC elevated 12.7.  Secondary to above. -Continue to monitor  Transient hypotension and bradycardia Acute.  Patient was noted to be hypotensive and bradycardic for which CPR had been started after patient had been given induction medications for intubation.  Post procedure patient was able to be extubated and vasopressin weaned off.  Patient complains of some chest soreness.  EKG without any significant changes.  Possibly due to medications used for induction of intubation.  Patient had received his regular home medications of metoprolol 50 mg p.o. this morning prior to the procedure. -Orders placed transfer to progressive bed -Check cardiac enzymes -Check echocardiogram -Held metoprolol initially due to soft blood pressures, but likely Will need to be resumed if blood pressures and heart rates remain stable -Cardiology consulted, will follow-up for any further recommendations  Acute respiratory failure secondary to heart failure with preserved EF Acute .  On  physical exam patient with at least 2+ pitting bilateral lower extremity edema and crackles on lung exam. Patient not on oxygen, but current on 3 L of oxygen to maintain O2 saturations.  At this time question if patient has acutely fluid overloaded.  Patient had been on 80 mg of Lasix p.o. -Continuous pulse oximetry with nasal cannula oxygen maintain O2 saturation greater than 92% -Wean to room room air as tolerated -Check BNP -Strict intake and output -Orders placed for Lasix 40 mg IV twice daily, but will defer to cardiology their recommendations for diuresis  Abnormal chest x-ray Chest ray noted right lower lobe opacity with concern for small pleural effusion.  On physical exam patient noted to have crackles.  Symptoms could be secondary to patient aspirating during intubation however not totally clear if this is just not fluids. -Patient is already on empiric antibiotics  Hyponatremia Acute.  Sodium was previously within normal limits, but has trended down since admission from 134 down to 127.  Suspect hypervolemic hyponatremia as a cause. -Continue to sodium levels daily with diuresis  Persistent atrial fibrillation on chronic anticoagulation Patient appears to be in atrial fibrillation chronically..  Patient was restarted on Eliquis, but was only held for 9/14.  -Continue Eliquis -Resume metoprolol if blood pressures remain stable  Acute kidney injury superimposed on chronic kidney disease stage IIIa Creatinine slowly trending up to 1.83 with BUN 47.  Baseline creatinine previously had been 1.2 on 8/12.  Elevated BUN to creatinine ratio and gives concern for prerenal cause of symptoms. -Check bladder scan -Check urinalysis and urine studies -Continue to monitor kidney function  Normocytic anemia Hemoglobin 11.4 which appears similar to today and otherwise stable. -Recheck CBC tomorrow morning  Hyperkalemia  Resolved.  Potassium 5.5, but repeat check 4.9.  TRH will continue to  follow the patient.  HPI: Ryan Garner is a 76 y.o. male with past medical history of HTN, HLD, CAD, STEMI s/p PCI, PAH, rate controlled A-fib on anticoagulation, DM type II, and morbid obesity who was mated by Dr. Sharol Given to on 9/13 for left leg cellulitis with abscess.  He had just recently been hospitalized from 8/10-8/14 for recurrent cellulitis of the left leg at [facility] after failing outpatient management with Augmentin.  He was discharged home on doxycycline following hospitalization, but noted wound started to worsen again with redness, swelling, and pain.  He had followed up with his primary care provider who had placed him on cefdinir, and then subsequently transition to Bactrim.  Despite this wound worsened and patient developed abscess.  He was seen in the office with Dr. Sharol Given on 9/11.  After admission patient was placed on vancomycin, metronidazole, and Rocephin.  He was taken to the operating room today.  During induction of for intubation patient became bradycardic and hypotensive and received CPR temporarily. Patient was placed on vasopressin and he stabilized for which they proceeded with debridement surgery.  Postop patient was able to be weaned off vasopressors and extubated successfully.  He reports having some substernal chest discomfort at this time.  He reports having intermittent chest discomfort but nothing too major prior to the procedure.  Chest x-ray had been obtained which noted hazy opacification of the right mid to lower lung field with possible right-sided pleural effusion.  Labs earlier this morning  noted WBC 12.7, hemoglobin 11.4, sodium 127, potassium 5.5, BUN 41, creatinine 1.3, and glucose 160.  EKG noted atrial fibrillation at 75 bpm with right axis.   Review of Systems: As mentioned in the history of present illness. All other systems reviewed and are negative. Past Medical History:  Diagnosis Date   A-fib [facility])    Acute on chronic diastolic CHF (congestive  heart failure) ([place]) 07/21/2020   Coronary artery disease    Diabetes mellitus without complication (HCC)    Dyspnea    Dysrhythmia    A-fib   Hyperlipidemia    Hypertension    Metabolic syndrome    Prediabetes    Sleep apnea    STEMI (ST elevation myocardial infarction) [facility])    Past Surgical History:  Procedure Laterality Date   APPENDECTOMY     CHOLECYSTECTOMY N/A 03/02/2020   Procedure: LAPAROSCOPIC CHOLECYSTECTOMY;  Surgeon: Aviva Signs, MD;  Location: [facility];  Service: General;  Laterality: N/A;   CORONARY STENT INTERVENTION N/A 04/30/2020   Procedure: CORONARY STENT INTERVENTION;  Surgeon: Martinique, Peter M, MD;  Location: [place] CV LAB;  Service: Cardiovascular;  Laterality: N/A;   CORONARY/GRAFT ACUTE MI REVASCULARIZATION N/A 04/30/2020   Procedure: Coronary/Graft Acute MI Revascularization;  Surgeon: Martinique, Peter M, MD;  Location: [place] CV LAB;  Service: Cardiovascular;  Laterality: N/A;   Eyelid Surgery Bilateral    HERNIA REPAIR     I & D EXTREMITY Left 04/04/2022   Procedure: LEFT LEG DEBRIDEMENT;  Surgeon: Newt Minion, MD;  Location: [place];  Service: Orthopedics;  Laterality: Left;   knee tendons repair     LEFT HEART CATH AND CORONARY ANGIOGRAPHY N/A 04/30/2020   Procedure: LEFT HEART CATH AND CORONARY ANGIOGRAPHY;  Surgeon: Martinique, Peter M, MD;  Location: [place] CV LAB;  Service: Cardiovascular;  Laterality: N/A;   ROTATOR CUFF REPAIR Bilateral    TONSILLECTOMY  Social History:  reports that he quit smoking about 63 years ago. His smoking use included cigarettes. He has never used smokeless tobacco. He reports that he does not drink alcohol and does not use drugs.  Allergies  Allergen Reactions   Flecainide     Per patient dizziness, shortness of breath, blurred vision   Rofecoxib Rash and Other (See Comments)    (Vioxx)    Family History  Problem Relation Age of Onset   Cancer Mother        lungs   Coronary artery disease Mother     Diabetes Brother    Heart attack Brother    Diabetes Maternal Grandmother    Arthritis Father     Prior to Admission medications   Medication Sig Start Date End Date Taking? Authorizing Provider  apixaban (ELIQUIS) 5 MG TABS tablet Take 1 tablet (5 mg total) by mouth 2 (two) times daily. 09/07/21  Yes Fay Records, MD  atorvastatin (LIPITOR) 80 MG tablet Take 1 tablet by mouth once daily 09/18/21  Yes Fay Records, MD  clopidogrel (PLAVIX) 75 MG tablet Take 1 tablet (75 mg total) by mouth daily with breakfast. 05/03/20  Yes Reino Bellis B, NP  cyanocobalamin (VITAMIN B12) 1000 MCG tablet Take 1,000 mcg by mouth daily.   Yes [provider]  furosemide (LASIX) 40 MG tablet Take 2 tablets by mouth once daily 03/23/22  Yes Fay Records, MD  metoprolol tartrate (LOPRESSOR) 50 MG tablet Take 1 tablet (50 mg total) by mouth 2 (two) times daily. 11/20/21 11/15/22 Yes Fay Records, MD  Misc Natural Products (PROSTATE SUPPORT) 300-15 MG TABS Take 1 tablet by mouth daily. Super Beta Prostate   Yes [provider]  Multiple Vitamin (MULTIVITAMIN WITH MINERALS) TABS tablet Take 1 tablet by mouth daily.   Yes [provider]  potassium chloride SA (KLOR-CON M20) 20 MEQ tablet Take 40 meq on Monday,Wednesday,Friday, take 20 meq all other days 10/25/21  Yes Fay Records, MD  albuterol (VENTOLIN HFA) 108 (90 Base) MCG/ACT inhaler Inhale 2 puffs into the lungs every 6 (six) hours as needed for wheezing or shortness of breath. 03/05/22   Roxan Hockey, MD  nitroGLYCERIN (NITROSTAT) 0.4 MG SL tablet Place 1 tablet (0.4 mg total) under the tongue every 5 (five) minutes x 3 doses as needed for chest pain. 10/25/21   Fay Records, MD  sulfamethoxazole-trimethoprim (BACTRIM DS) 800-160 MG tablet Take 1 tablet by mouth 2 (two) times daily. Patient not taking: Reported on 04/03/2022 03/23/22   [name], [name], MD  Tiotropium Bromide Monohydrate (SPIRIVA RESPIMAT) 1.25 MCG/ACT AERS Inhale 2  puffs into the lungs daily. Patient not taking: Reported on 04/03/2022 03/05/22   Roxan Hockey, MD  traMADol (ULTRAM) 50 MG tablet Take 1 tablet (50 mg total) by mouth 2 (two) times daily as needed. Patient not taking: Reported on 04/03/2022 03/08/22   [name], [name], MD    Physical Exam: Vitals:   04/06/22 1100 04/06/22 1115 04/06/22 1130 04/06/22 1145  BP: 115/66 107/88 102/67 110/82  Pulse: 83 84 83 77  Resp: '16 14 14 15  '$ Temp:      TempSrc:      SpO2: 100% 96% 98% 98%  Weight:      Height:       Constitutional: Morbidly obese elderly male currently in no acute Eyes: PERRL, lids and conjunctivae normal ENMT: Mucous membranes are moist.   Neck: normal, supple  Respiratory: Normal respiratory effort  with crackles appreciated in the mid to lower lung fields.  Currently on 3 L nasal cannula oxygen with O2 saturations maintained greater than 92% Cardiovascular: Irregular, Irregular.  At least 2+ pitting bilateral lower extremity edema Abdomen: no tenderness, no masses palpated. No hepatosplenomegaly. Bowel sounds positive.  Musculoskeletal: no clubbing / cyanosis.  Wound VAC currently in place of the left lower extremity. Skin: Postoperative wound currently bandaged. Neurologic: CN 2-12 grossly intact.  normal. Strength 5/5 in all 4.  Psychiatric: Normal judgment and insight. Alert and oriented x 3. Normal mood.   Data Reviewed:   Reviewed repeat labs and imaging as noted above which noted sodium 129, potassium 4.9, BUN 48, creatinine 1. 84, magnesium 1.6.       Primary team communication:  Thank you very much for involving [place] in the care of your patient.  Author: Norval Morton, MD 04/06/2022 12:12 PM  For on call review [email or web address].

## 2022-04-06 NOTE — Anesthesia Preprocedure Evaluation (Addendum)
Anesthesia Evaluation  Patient identified by MRN, date of birth, ID band Patient awake    Reviewed: Allergy & Precautions, NPO status , Patient's Chart, lab work & pertinent test results  Airway Mallampati: II  TM Distance: >3 FB Neck ROM: Full    Dental no notable dental hx.    Pulmonary sleep apnea and Continuous Positive Airway Pressure Ventilation , COPD, former smoker,    Pulmonary exam normal        Cardiovascular hypertension, Pt. on home beta blockers + angina + CAD, + Past MI, + Cardiac Stents and +CHF  Normal cardiovascular exam+ dysrhythmias Atrial Fibrillation      Neuro/Psych  Neuromuscular disease negative psych ROS   GI/Hepatic negative GI ROS, Neg liver ROS,   Endo/Other  diabetesMorbid obesity  Renal/GU Renal disease     Musculoskeletal  (+) Arthritis ,   Abdominal (+) + obese,   Peds  Hematology  (+) Blood dyscrasia, anemia ,   Anesthesia Other Findings Abscess Left Proximal Tibia  Reproductive/Obstetrics                           Anesthesia Physical Anesthesia Plan  ASA: 4  Anesthesia Plan: General   Post-op Pain Management:    Induction: Intravenous  PONV Risk Score and Plan: 2 and Ondansetron, Dexamethasone and Treatment may vary due to age or medical condition  Airway Management Planned: LMA  Additional Equipment:   Intra-op Plan:   Post-operative Plan: Extubation in OR  Informed Consent: I have reviewed the patients History and Physical, chart, labs and discussed the procedure including the risks, benefits and alternatives for the proposed anesthesia with the patient or authorized representative who has indicated his/her understanding and acceptance.     Dental advisory given  Plan Discussed with: CRNA  Anesthesia Plan Comments:         Anesthesia Quick Evaluation

## 2022-04-06 NOTE — Interval H&P Note (Signed)
History and Physical Interval Note:  04/06/2022 6:43 AM  Ryan Garner  has presented today for surgery, with the diagnosis of Abscess Left Proximal Tibia.  The various methods of treatment have been discussed with the patient and family. After consideration of risks, benefits and other options for treatment, the patient has consented to  Procedure(s): LEFT LEG DEBRIDEMENT (Left) as a surgical intervention.  The patient's history has been reviewed, patient examined, no change in status, stable for surgery.  I have reviewed the patient's chart and labs.  Questions were answered to the patient's satisfaction.     Newt Minion

## 2022-04-07 ENCOUNTER — Inpatient Hospital Stay (HOSPITAL_COMMUNITY): Payer: Medicare Other

## 2022-04-07 DIAGNOSIS — I959 Hypotension, unspecified: Secondary | ICD-10-CM

## 2022-04-07 DIAGNOSIS — I4819 Other persistent atrial fibrillation: Secondary | ICD-10-CM | POA: Diagnosis not present

## 2022-04-07 DIAGNOSIS — I469 Cardiac arrest, cause unspecified: Secondary | ICD-10-CM

## 2022-04-07 DIAGNOSIS — I5033 Acute on chronic diastolic (congestive) heart failure: Secondary | ICD-10-CM | POA: Diagnosis not present

## 2022-04-07 DIAGNOSIS — L02416 Cutaneous abscess of left lower limb: Secondary | ICD-10-CM | POA: Diagnosis not present

## 2022-04-07 DIAGNOSIS — N179 Acute kidney failure, unspecified: Secondary | ICD-10-CM | POA: Diagnosis not present

## 2022-04-07 DIAGNOSIS — I4821 Permanent atrial fibrillation: Secondary | ICD-10-CM | POA: Diagnosis not present

## 2022-04-07 LAB — BASIC METABOLIC PANEL
Anion gap: 13 (ref 5–15)
BUN: 52 mg/dL — ABNORMAL HIGH (ref 8–23)
CO2: 24 mmol/L (ref 22–32)
Calcium: 9.1 mg/dL (ref 8.9–10.3)
Chloride: 92 mmol/L — ABNORMAL LOW (ref 98–111)
Creatinine, Ser: 1.84 mg/dL — ABNORMAL HIGH (ref 0.61–1.24)
GFR, Estimated: 37 mL/min — ABNORMAL LOW (ref >60)
Glucose, Bld: 154 mg/dL — ABNORMAL HIGH (ref 70–99)
Potassium: 5 mmol/L (ref 3.5–5.1)
Sodium: 129 mmol/L — ABNORMAL LOW (ref 135–145)

## 2022-04-07 LAB — ECHOCARDIOGRAM COMPLETE
AR max vel: 1.38 cm2
AV Area VTI: 1.23 cm2
AV Area mean vel: 1.25 cm2
AV Mean grad: 17 mmHg
AV Peak grad: 29.4 mmHg
Ao pk vel: 2.71 m/s
Height: 68 in
MV M vel: 4.09 m/s
MV Peak grad: 66.9 mmHg
Radius: 1.15 cm
S' Lateral: 4 cm
Weight: 4736 oz

## 2022-04-07 LAB — VANCOMYCIN, PEAK: Vancomycin Pk: 49 ug/mL — ABNORMAL HIGH (ref 30–40)

## 2022-04-07 LAB — MAGNESIUM: Magnesium: 2 mg/dL (ref 1.7–2.4)

## 2022-04-07 LAB — CBC
HCT: 33.3 % — ABNORMAL LOW (ref 39.0–52.0)
Hemoglobin: 11.3 g/dL — ABNORMAL LOW (ref 13.0–17.0)
MCH: 31.3 pg (ref 26.0–34.0)
MCHC: 33.9 g/dL (ref 30.0–36.0)
MCV: 92.2 fL (ref 80.0–100.0)
Platelets: 225 10*3/uL (ref 150–400)
RBC: 3.61 MIL/uL — ABNORMAL LOW (ref 4.22–5.81)
RDW: 14.2 % (ref 11.5–15.5)
WBC: 12.4 10*3/uL — ABNORMAL HIGH (ref 4.0–10.5)
nRBC: 0 % (ref 0.0–0.2)

## 2022-04-07 MED ORDER — METOPROLOL TARTRATE 12.5 MG HALF TABLET
12.5000 mg | ORAL_TABLET | Freq: Two times a day (BID) | ORAL | Status: DC
  Administered 2022-04-07 – 2022-04-11 (×9): 12.5 mg via ORAL
  Filled 2022-04-07 (×9): qty 1

## 2022-04-07 MED ORDER — FUROSEMIDE 10 MG/ML IJ SOLN
60.0000 mg | Freq: Two times a day (BID) | INTRAMUSCULAR | Status: DC
  Administered 2022-04-07 – 2022-04-12 (×10): 60 mg via INTRAVENOUS
  Filled 2022-04-07 (×10): qty 6

## 2022-04-07 MED ORDER — ALBUMIN HUMAN 25 % IV SOLN
25.0000 g | Freq: Two times a day (BID) | INTRAVENOUS | Status: AC
  Administered 2022-04-07 (×2): 25 g via INTRAVENOUS
  Filled 2022-04-07 (×2): qty 100

## 2022-04-07 NOTE — Progress Notes (Signed)
Patient ID: Ryan Garner, male   DOB: October 10, 1945, 76 y.o.   MRN: [id number] Patient is postoperative day 1 repeat debridement left leg.  There is 250 cc in the wound VAC canister.  Cultures are pending.  Patient undergoing cardiac echo at this time.  Plan for discharge to home when patient medically stable.  Patient unable to urinate with urinary retention and will place a Foley catheter.

## 2022-04-07 NOTE — Progress Notes (Signed)
Mobility Specialist Progress Note:   04/07/22 1227  Mobility  Activity Ambulated with assistance in room;Transferred from chair to bed  Level of Assistance Moderate assist, patient does 50-74%  Assistive Device Front wheel walker  Distance Ambulated (ft) 2 ft  Activity Response Tolerated well  $Mobility charge 1 Mobility   Pt received in chair asking to get back to bed. Complaints of chest soreness because if chest compressions. MinA to stand from chair but modA for bed mobility. Left in bed with call bell in reach and all needs met.   [facility] Surveyor, mining Chat only

## 2022-04-07 NOTE — Progress Notes (Signed)
Progress Note  Patient Name: Ryan Garner Date of Encounter: 04/07/2022  Primary Cardiologist: Dorris Carnes, MD  Subjective   Up in chair for 3 hours.  Still having some trouble with urinary hesitancy, did pass some urine while standing.  Short of breath moving around.  Inpatient Medications    Scheduled Meds:  apixaban  5 mg Oral BID   vitamin C  1,000 mg Oral Daily   atorvastatin  80 mg Oral Daily   Chlorhexidine Gluconate Cloth  6 each Topical Daily   clopidogrel  75 mg Oral Q breakfast   docusate sodium  100 mg Oral Daily   docusate sodium  100 mg Oral BID   furosemide  40 mg Intravenous BID   metroNIDAZOLE  500 mg Oral Q12H   nutrition supplement (JUVEN)  1 packet Oral BID BM   pantoprazole  40 mg Oral Daily   potassium chloride  20 mEq Oral Once per day on Sun Tue Thu Sat   And   potassium chloride  40 mEq Oral Q M,W,F   zinc sulfate  220 mg Oral Daily   Continuous Infusions:  albumin human 25 g (04/07/22 1009)   cefTRIAXone (ROCEPHIN)  IV 0 g (04/05/22 1455)   methocarbamol (ROBAXIN) IV     vancomycin 1,000 mg (04/06/22 1653)   PRN Meds: acetaminophen, alum & mag hydroxide-simeth, bisacodyl, bisacodyl, guaiFENesin-dextromethorphan, hydrALAZINE, HYDROmorphone (DILAUDID) injection, labetalol, magnesium citrate, magnesium citrate, methocarbamol **OR** methocarbamol (ROBAXIN) IV, metoCLOPramide **OR** metoCLOPramide (REGLAN) injection, metoprolol tartrate, ondansetron, ondansetron **OR** ondansetron (ZOFRAN) IV, oxyCODONE, oxyCODONE, phenol, polyethylene glycol, polyethylene glycol, potassium chloride   Vital Signs    Vitals:   04/06/22 2057 04/07/22 0017 04/07/22 0412 04/07/22 0800  BP: 96/70 95/62 (!) 124/99 102/76  Pulse: 78 73 98 72  Resp: '18 14 20 20  '$ Temp: 97.7 F (36.5 C) 97.6 F (36.4 C) 97.8 F (36.6 C) (!) 97.4 F (36.3 C)  TempSrc: Oral Oral Oral Oral  SpO2: 100% 95% 97% 98%  Weight:      Height:        Intake/Output Summary (Last 24 hours)  at 04/07/2022 1208 Last data filed at 04/07/2022 1009 Gross per 24 hour  Intake 240 ml  Output 1150 ml  Net -910 ml   Filed Weights   04/04/22 0731 04/06/22 0833  Weight: 134.3 kg 134.3 kg    Telemetry    Atrial fibrillation.  Personally reviewed.  ECG    An ECG dated 04/06/2022 was personally reviewed today and demonstrated:  Atrial fibrillation with rightward axis and diffuse nonspecific ST-T changes.  Physical Exam   GEN: No acute distress.   Neck: No JVD. Cardiac: Irregularly irregular without gallop.  Respiratory: Decreased breath sounds to bases. GI: Soft, nontender, bowel sounds present. MS: Chronic appearing edema; left leg wound VAC in place. Neuro:  Nonfocal. Psych: Alert and oriented x 3. Normal affect.  Labs    Chemistry Recent Labs  Lab 04/06/22 0112 04/06/22 1253 04/07/22 0223  NA 127* 129* 129*  K 5.5* 4.8 5.0  CL 95* 96* 92*  CO2 21* 22 24  GLUCOSE 160* 126* 154*  BUN 47* 48* 52*  CREATININE 1.83* 1.84* 1.84*  CALCIUM 9.1 8.9 9.1  GFRNONAA 37* 37* 37*  ANIONGAP '11 11 13     '$ Hematology Recent Labs  Lab 04/05/22 0106 04/06/22 0112 04/07/22 0223  WBC 11.8* 12.7* 12.4*  RBC 3.60* 3.64* 3.61*  HGB 11.1* 11.4* 11.3*  HCT 33.5* 33.7* 33.3*  MCV 93.1 92.6  92.2  MCH 30.8 31.3 31.3  MCHC 33.1 33.8 33.9  RDW 14.0 14.1 14.2  PLT 270 256 225    Cardiac Enzymes Recent Labs  Lab 04/06/22 1253 04/06/22 1451  TROPONINIHS 21* 27*    BNP Recent Labs  Lab 04/06/22 1253  BNP 682.5*      Radiology    DG CHEST PORT 1 VIEW  Result Date: 04/06/2022 CLINICAL DATA:  Respiratory abnormalities. Patient coded in the OR and now has chest pain with shortness of breath. EXAM: PORTABLE CHEST 1 VIEW COMPARISON:  03/01/2022 FINDINGS: Lungs are hypoinflated demonstrate airspace opacification over the right mid to lower lung which may be due to atelectasis or infection. Possible small amount right pleural fluid. Mild stable cardiomegaly. Remainder of the  exam is unchanged. IMPRESSION: 1. Hazy opacification over the right mid to lower lung which may be due to atelectasis or infection. Possible small amount right pleural fluid. 2. Mild stable cardiomegaly. Electronically Signed   By: Marin Olp M.D.   On: 04/06/2022 11:41     Assessment & Plan    1.  Episode of bradycardia and hypotension following intubation and with induction of anesthesia per chart review.  Received transient resuscitative efforts in the OR and was otherwise able to complete operation.  He has had no subsequent events under observation on the floor.  Currently off metoprolol 50 mg twice daily which he was taking at home.  2.  Permanent atrial fibrillation, on Eliquis for stroke prophylaxis.  3.  CKD stage IIIb, creatinine 1.84 and potassium 5.0.  4.  Left leg cellulitis with abscess, continues on antibiotic therapy and is now status post left leg debridement with wound VAC in place.  5.  Suspected HFpEF with fluid overload.  He is on Lasix 80 mg daily at home, this was converted to 40 mg IV twice daily.  Intake and output fairly even at this point.  Increase IV Lasix to 60 mg twice daily for now.  Agree that Foley catheter would be reasonable.  Follow-up on echocardiogram results.  Plan to resume Lopressor starting at 12.5 mg twice daily and follow.  Signed, Rozann Lesches, MD  04/07/2022, 12:08 PM

## 2022-04-07 NOTE — Progress Notes (Addendum)
PROGRESS NOTE/Consult    Ryan Garner  AST:[id number] DOB: 05-Oct-1945 DOA: 04/04/2022 PCP: [name], [name], MD    No chief complaint on file.   Brief Narrative:  Patient 76 year old gentleman history of hypertension, hyperlipidemia, CAD with history of STEMI status post PCI, PAH, rate controlled A-fib on anticoagulation, type 2 diabetes, morbid obesity who was admitted to orthopedic service, per Dr. Sharol Given for left leg cellulitis and abscess.  Patient recently hospitalized from 8/10 to 8/14 for recurrent cellulitis of the left leg at [facility] and failed outpatient management with Augmentin.  Patient noted to have been discharged on Doxy following hospitalization wound got worse with redness swelling and pain seen by PCP placed on cefdinir and subsequently transition to Bactrim.  Despite oral antibiotics wound worsening patient developed an abscess seen in the office by Dr. Sharol Given on 911, admitted placed on IV Vanco Flagyl and Rocephin taken to the OR on 04/06/2022.  During induction for intubation patient became bradycardic hypotensive received CPR temporary placed on pressors stabilized and he proceeded with debridement surgery.  Postoperatively patient noted to have been weaned off pressors extubated successfully.  Patient with some substernal chest discomfort.  Chest x-ray noted hazy opacification of the right mid to lower lung with possible right-sided effusion.  Hospitalist consulted for hypotension and bradycardia.  Cardiology consulted.  ID consulted and following.   Assessment & Plan:   Principal Problem:   Abscess of leg, left Active Problems:   Atrial fibrillation (HCC)   Transient hypotension   Acute kidney injury superimposed on chronic kidney disease (HCC)   Hyponatremia   Bradycardia   (HFpEF) heart failure with preserved ejection fraction (HCC)   Hypotension  #1 left lower extremity cellulitis with abscess -Status post I and D per Dr. Sharol Given 04/06/2022 with placement of  wound VAC. -Initial cultures from left leg debridement 04/04/2022 with no growth to date x3 days. -MRSA PCR negative. -Currently on IV vancomycin, Rocephin, metronidazole per ID recommendations. -Per ID recommended to change to daptomycin if no improvement with kidney function. -Per primary team/orthopedics Dr. Sharol Given. -Antibiotics per ID.  2.  Leukocytosis -Likely secondary to problem #1.  3.  Transient hypotension and bradycardia -Patient noted to be hypotensive and bradycardic for which CPR had to be started after patient given induction medications for intubation. -Patient extubated postprocedure, vasopressin weaned off. -Patient with complaints of chest soreness. -EKG with no significant changes. -Hypotension bradycardia likely secondary to induction of intubation. -Noted to have been on metoprolol 50 mg the morning prior to procedure. -Cardiac enzymes slightly elevated but flat. -2D echo with EF of 50 to 55%,NWMA, mild concentric LVH, intraventricular septum flattening in systole and diastole consistent with right ventricular pressure and volume overload, moderately reduced right ventricular systolic function, right ventricular size is moderately enlarged, severely elevated PA systolic pressure, severely dilated left atrial size, severely dilated right atrial size, severe MVR.  Moderate mitral annular calcification.  Moderate aortic valve stenosis.  Aortic dilatation noted. -Cardiology following.  4.  Acute respiratory failure secondary to acute on chronic diastolic CHF -Patient with volume overload on examination with 2-3+ bilateral pitting lower extremity edema, crackles noted on examination. -Patient with shortness of breath which she states is chronic. -Patient noted to be on Lasix 80 mg orally. -BNP elevated at 682.5. --2D echo with EF of 50 to 55%,NWMA, mild concentric LVH, intraventricular septum flattening in systole and diastole consistent with right ventricular pressure and  volume overload, moderately reduced right ventricular systolic function, right ventricular size  is moderately enlarged, severely elevated PA systolic pressure, severely dilated left atrial size, severely dilated right atrial size, severe MVR.  Moderate mitral annular calcification.  Moderate aortic valve stenosis.  Aortic dilatation noted. -Patient currently on Lasix 40 mg IV every 12 hours with urine output of 1.050 L over the past 24 hours, Lasix dose increased to 60 mg IV every 12 hours per cardiology. -Place on IV albumin twice daily as blood pressure borderline and to aid with diuresis. -Cardiology following. -Per cardiology.  5.  AKI superimposed on CKD stage IIIa/urinary retention -Creatinine noted to be trending up to 1.83 with a BUN of 47 prior creatinine noted at 1.2 on 03/03/2022. -Likely secondary to prerenal azotemia secondary to volume overload in the setting of possible postobstructive diuresis as patient with urinary retention. -Patient was of urinary retention per RN today. -Foley catheter ordered to be placed. -Renal ultrasound pending. -Continue IV Lasix. -Strict I's and O's, daily weights. -Follow.  6.  Normocytic anemia -Follow H&H.  7.  Hyperkalemia -Resolved.  8.  Hyponatremia -Likely secondary to hypervolemic hyponatremia. -Continue IV diuresis.  9.  Hypomagnesemia -Repleted.   DVT prophylaxis: Eliquis Code Status: Full Family Communication: Updated patient.  No family at bedside. Disposition: Per primary team  Status is: Inpatient Remains inpatient appropriate because: Severity of illness   Consultants:  Cardiology: Dr. Harriet Masson 04/06/2022 Infectious disease: Dr. Candiss Norse 04/05/2022 Hospitalist: Dr. Tamala Julian 04/06/2022  Procedures:  Chest x-ray 04/06/2022 MRI left tibia 04/04/2022 Renal ultrasound 04/07/2022 pending 2D echo 04/07/2022 Left leg debridement with application of wound VAC per Dr. Sharol Given 04/06/2022  Antimicrobials:  IV Ancef 04/04/2022 x 1 dose IV  Rocephin 04/04/2022>>>>04/11/2022 IV vancomycin 04/04/2022>>>> Oral Flagyl 04/05/2022>>>  Subjective: Sitting up at the side of the bed getting ready to work with mobility specialist.  Denies any chest pain.  Complains of chronic shortness of breath.  Complaining of some chest discomfort from chest compressions intraoperatively.    Objective: Vitals:   04/07/22 0017 04/07/22 0412 04/07/22 0800 04/07/22 1245  BP: 95/62 (!) 124/99 102/76 102/67  Pulse: 73 98 72 75  Resp: '14 20 20 18  '$ Temp: 97.6 F (36.4 C) 97.8 F (36.6 C) (!) 97.4 F (36.3 C) 98.2 F (36.8 C)  TempSrc: Oral Oral Oral Oral  SpO2: 95% 97% 98% 94%  Weight:      Height:        Intake/Output Summary (Last 24 hours) at 04/07/2022 1450 Last data filed at 04/07/2022 1327 Gross per 24 hour  Intake 240 ml  Output 2230 ml  Net -1990 ml   Filed Weights   04/04/22 0731 04/06/22 0833  Weight: 134.3 kg 134.3 kg    Examination:  General exam: Appears calm and comfortable  Respiratory system: Some scattered crackles diffusely on exam.  No wheezing.  Fair air movement.  Speaking in full sentences.  Cardiovascular system: Irregularly irregular. No JVD, murmurs, rubs, gallops or clicks.  3+ bilateral lower extremity pitting edema. Gastrointestinal system: Abdomen is nondistended, soft and nontender. No organomegaly or masses felt. Normal bowel sounds heard. Central nervous system: Alert and oriented. No focal neurological deficits. Extremities: Symmetric 5 x 5 power.  Postop dressing left lower extremity. Skin: No rashes, lesions or ulcers Psychiatry: Judgement and insight appear normal. Mood & affect appropriate.     Data Reviewed: I have personally reviewed following labs and imaging studies  CBC: Recent Labs  Lab 04/05/22 0106 04/06/22 0112 04/07/22 0223  WBC 11.8* 12.7* 12.4*  HGB 11.1* 11.4* 11.3*  HCT  33.5* 33.7* 33.3*  MCV 93.1 92.6 92.2  PLT 270 256 035    Basic Metabolic Panel: Recent Labs  Lab  04/04/22 1330 04/05/22 0106 04/06/22 0112 04/06/22 1253 04/07/22 0223  NA 134* 128* 127* 129* 129*  K 4.7 4.9 5.5* 4.8 5.0  CL 102 99 95* 96* 92*  CO2 23 22 21* 22 24  GLUCOSE 147* 173* 160* 126* 154*  BUN 32* 36* 47* 48* 52*  CREATININE 1.71* 1.81* 1.83* 1.84* 1.84*  CALCIUM 8.8* 8.6* 9.1 8.9 9.1  MG  --   --   --  1.6* 2.0    GFR: Estimated Creatinine Clearance: 44.4 mL/min (A) (by C-G formula based on SCr of 1.84 mg/dL (H)).  Liver Function Tests: No results for input(s): "AST", "ALT", "ALKPHOS", "BILITOT", "PROT", "ALBUMIN" in the last 168 hours.  CBG: Recent Labs  Lab 04/04/22 0922 04/04/22 1021 04/06/22 0813 04/06/22 1047 04/06/22 1207  GLUCAP 114* 106* 130* 121* 119*     Recent Results (from the past 240 hour(s))  Aerobic/Anaerobic Culture w Gram Stain (surgical/deep wound)     Status: None (Preliminary result)   Collection Time: 04/04/22  9:44 AM   Specimen: PATH Soft tissue  Result Value Ref Range Status   Specimen Description LEG  Final   Special Requests  LEFT DEBRIDEMENT  Final   Gram Stain NO WBC SEEN NO ORGANISMS SEEN   Final   Culture   Final    NO GROWTH 3 DAYS NO ANAEROBES ISOLATED; CULTURE IN PROGRESS FOR 5 DAYS Performed at [facility], [place] [address], [place], [place] [address]    Report Status PENDING  Incomplete  Surgical pcr screen     Status: None   Collection Time: 04/05/22  8:11 PM   Specimen: Nasal Mucosa; Nasal Swab  Result Value Ref Range Status   MRSA, PCR NEGATIVE NEGATIVE Final   Staphylococcus aureus NEGATIVE NEGATIVE Final    Comment: (NOTE) The Xpert SA Assay (FDA approved for NASAL specimens in patients 59 years of age and older), is one component of a comprehensive surveillance program. It is not intended to diagnose infection nor to guide or monitor treatment. Performed at [facility], [place] [address] [place], [place] [address]   MRSA Next Gen by PCR, Nasal     Status: None   Collection Time: 04/06/22  12:25 PM   Specimen: Nasal Mucosa; Nasal Swab  Result Value Ref Range Status   MRSA by PCR Next Gen NOT DETECTED NOT DETECTED Final    Comment: (NOTE) The GeneXpert MRSA Assay (FDA approved for NASAL specimens only), is one component of a comprehensive MRSA colonization surveillance program. It is not intended to diagnose MRSA infection nor to guide or monitor treatment for MRSA infections. Test performance is not FDA approved in patients less than 61 years old. Performed at [facility], [place] [address] [place],  [address]          Radiology Studies: ECHOCARDIOGRAM COMPLETE  Result Date: 04/07/2022    ECHOCARDIOGRAM REPORT   Patient Name:   [name] Date of Exam: 04/07/2022 Medical Rec #:  [id number]      Height:       68.0 in Accession #:    [id number]     Weight:       296.0 lb Date of Birth:  October 01, 1945       BSA:          2.414 m Patient Age:  76 years       BP:           102/76 mmHg Patient Gender: M              HR:           67 bpm. Exam Location:  Inpatient Procedure: 2D Echo, Cardiac Doppler and Color Doppler Indications:    Brief cardiac arrest following wound debridement  History:        Patient has prior history of Echocardiogram examinations, most                 recent 06/02/2021. CHF, CAD and Previous Myocardial Infarction,                 Arrythmias:Atrial Fibrillation; Risk Factors:Hypertension,                 Diabetes and Dyslipidemia.  Sonographer:    Merrie Roof RDCS Referring Phys: Berniece Salines  Sonographer Comments: Patient is obese. Image acquisition challenging due to patient body habitus and patient supine. IMPRESSIONS  1. Left ventricular ejection fraction, by estimation, is 50 to 55%. The left ventricle has mildly decreased function. The left ventricle has no regional wall motion abnormalities. There is mild concentric left ventricular hypertrophy. Left ventricular diastolic function could not be evaluated. There is the interventricular septum is  flattened in systole and diastole, consistent with right ventricular pressure and volume overload.  2. Right ventricular systolic function is moderately reduced. The right ventricular size is moderately enlarged. There is severely elevated pulmonary artery systolic pressure. The estimated right ventricular systolic pressure is 23.5 mmHg.  3. Left atrial size was severely dilated.  4. Right atrial size was severely dilated.  5. The mitral valve is grossly normal. Severe mitral valve regurgitation. Moderate mitral annular calcification.  6. The aortic valve is tricuspid. There is severe calcifcation of the aortic valve. There is severe thickening of the aortic valve. Aortic valve regurgitation is not visualized. Moderate aortic valve stenosis. Aortic valve mean gradient measures 17.0 mmHg. Aortic valve Vmax measures 2.71 m/s. Aortic valve acceleration time measures 120 msec.  7. Aortic dilatation noted. There is mild dilatation of the ascending aorta, measuring 41 mm. There is mild dilatation of the aortic root, measuring 39 mm.  8. The inferior vena cava is dilated in size with <50% respiratory variability, suggesting right atrial pressure of 15 mmHg. Comparison(s): Prior images reviewed side by side. The left ventricular function is worsened. Mitral insufficiency is worse. FINDINGS  Left Ventricle: Left ventricular ejection fraction, by estimation, is 50 to 55%. The left ventricle has mildly decreased function. The left ventricle has no regional wall motion abnormalities. The left ventricular internal cavity size was normal in size. There is mild concentric left ventricular hypertrophy. The interventricular septum is flattened in systole and diastole, consistent with right ventricular pressure and volume overload. Left ventricular diastolic function could not be evaluated due to atrial fibrillation. Left ventricular diastolic function could not be evaluated. Right Ventricle: The right ventricular size is moderately  enlarged. Right vetricular wall thickness was not well visualized. Right ventricular systolic function is moderately reduced. There is severely elevated pulmonary artery systolic pressure. The tricuspid regurgitant velocity is 4.19 m/s, and with an assumed right atrial pressure of 15 mmHg, the estimated right ventricular systolic pressure is 57.3 mmHg. Left Atrium: Left atrial size was severely dilated. Right Atrium: Right atrial size was severely dilated. Pericardium: There is no evidence of pericardial effusion. Mitral Valve: The mitral valve  is grossly normal. Moderate mitral annular calcification. Severe mitral valve regurgitation. Tricuspid Valve: The tricuspid valve is normal in structure. Tricuspid valve regurgitation is mild. Aortic Valve: Gradients underestimate the aortic stenosis severity due to low stroke volume. The aortic valve is tricuspid. There is severe calcifcation of the aortic valve. There is severe thickening of the aortic valve. Aortic valve regurgitation is not visualized. Moderate aortic stenosis is present. Aortic valve mean gradient measures 17.0 mmHg. Aortic valve peak gradient measures 29.4 mmHg. Aortic valve area, by VTI measures 1.23 cm. Pulmonic Valve: The pulmonic valve was grossly normal. Pulmonic valve regurgitation is not visualized. Aorta: Aortic dilatation noted. There is mild dilatation of the ascending aorta, measuring 41 mm. There is mild dilatation of the aortic root, measuring 39 mm. Venous: A pattern of systolic flow reversal, suggestive of severe mitral regurgitation is recorded from the left upper pulmonary vein. The inferior vena cava is dilated in size with less than 50% respiratory variability, suggesting right atrial pressure of 15 mmHg. IAS/Shunts: No atrial level shunt detected by color flow Doppler.  LEFT VENTRICLE PLAX 2D LVIDd:         5.40 cm   Diastology LVIDs:         4.00 cm   LV e' lateral: 12.00 cm/s LV PW:         1.20 cm LV IVS:        1.20 cm LVOT  diam:     2.40 cm LV SV:         74 LV SV Index:   31 LVOT Area:     4.52 cm  RIGHT VENTRICLE            IVC RV Basal diam:  4.70 cm    IVC diam: 2.50 cm RV Mid diam:    3.30 cm RV S prime:     6.09 cm/s TAPSE (M-mode): 1.2 cm LEFT ATRIUM              Index        RIGHT ATRIUM           Index LA diam:        5.20 cm  2.15 cm/m   RA Area:     28.40 cm LA Vol (A2C):   115.0 ml 47.63 ml/m  RA Volume:   95.20 ml  39.43 ml/m LA Vol (A4C):   107.0 ml 44.32 ml/m LA Biplane Vol: 114.0 ml 47.22 ml/m  AORTIC VALVE AV Area (Vmax):    1.38 cm AV Area (Vmean):   1.25 cm AV Area (VTI):     1.23 cm AV Vmax:           271.00 cm/s AV Vmean:          197.000 cm/s AV VTI:            0.602 m AV Peak Grad:      29.4 mmHg AV Mean Grad:      17.0 mmHg LVOT Vmax:         82.51 cm/s LVOT Vmean:        54.523 cm/s LVOT VTI:          0.163 m LVOT/AV VTI ratio: 0.27  AORTA Ao Root diam: 3.90 cm Ao Asc diam:  4.00 cm MR Peak grad:    66.9 mmHg    TRICUSPID VALVE MR Mean grad:    47.0 mmHg    TR Peak grad:   70.2 mmHg MR Vmax:  409.00 cm/s  TR Vmax:        419.00 cm/s MR Vmean:        330.0 cm/s MR PISA:         8.31 cm     SHUNTS MR PISA Eff ROA: 68 mm       Systemic VTI:  0.16 m MR PISA Radius:  1.15 cm      Systemic Diam: 2.40 cm Dani Gobble Croitoru MD Electronically signed by Sanda Klein MD Signature Date/Time: 04/07/2022/2:16:06 PM    Final    DG CHEST PORT 1 VIEW  Result Date: 04/06/2022 CLINICAL DATA:  Respiratory abnormalities. Patient coded in the OR and now has chest pain with shortness of breath. EXAM: PORTABLE CHEST 1 VIEW COMPARISON:  03/01/2022 FINDINGS: Lungs are hypoinflated demonstrate airspace opacification over the right mid to lower lung which may be due to atelectasis or infection. Possible small amount right pleural fluid. Mild stable cardiomegaly. Remainder of the exam is unchanged. IMPRESSION: 1. Hazy opacification over the right mid to lower lung which may be due to atelectasis or infection. Possible  small amount right pleural fluid. 2. Mild stable cardiomegaly. Electronically Signed   By: Marin Olp M.D.   On: 04/06/2022 11:41        Scheduled Meds:  apixaban  5 mg Oral BID   vitamin C  1,000 mg Oral Daily   atorvastatin  80 mg Oral Daily   Chlorhexidine Gluconate Cloth  6 each Topical Daily   clopidogrel  75 mg Oral Q breakfast   docusate sodium  100 mg Oral BID   furosemide  60 mg Intravenous BID   metoprolol tartrate  12.5 mg Oral BID   metroNIDAZOLE  500 mg Oral Q12H   nutrition supplement (JUVEN)  1 packet Oral BID BM   pantoprazole  40 mg Oral Daily   potassium chloride  20 mEq Oral Once per day on Sun Tue Thu Sat   And   potassium chloride  40 mEq Oral Q M,W,F   zinc sulfate  220 mg Oral Daily   Continuous Infusions:  albumin human 25 g (04/07/22 1009)   cefTRIAXone (ROCEPHIN)  IV 2 g (04/07/22 1253)   methocarbamol (ROBAXIN) IV     vancomycin 1,000 mg (04/06/22 1653)     LOS: 3 days    Time spent: 40 minutes    Irine Seal, MD [facility]   To contact the attending provider between 7A-7P or the covering provider during after hours 7P-7A, please log into the web site [email or web address] and access using universal [place] password for that web site. If you do not have the password, please call the hospital operator.  04/07/2022, 2:50 PM

## 2022-04-07 NOTE — Progress Notes (Signed)
  Echocardiogram 2D Echocardiogram has been performed.  Ryan Garner F 04/07/2022, 9:53 AM

## 2022-04-07 NOTE — Progress Notes (Signed)
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 Pt s/p LLE I&D 9/13 and 9/15 with VAC. 9/15 during intubation pt with bradycardia and hypotension.  PMH: afib, CHF,  DM, HLD, HTN, STEMI, rotator cuff repair    PT Comments    Pt with flat affect and reports being in chair to long this morning with trouble getting up. Pt concerned about caring for his wife but lacks awareness of his deficits and not being to move on his own let alone care for her. Pt with significant deficits with transfers and unable to walk at this time with ST-SNF recommended until pt able to perform mobility at supervision level. Encouraged HEP and increased mobility attempts. Will continue to follow.   HR 104 SpO2 97% on RA    Recommendations for follow up therapy are one component of a multi-disciplinary discharge planning process, led by the attending physician.  Recommendations may be updated based on patient status, additional functional criteria and insurance authorization.  Follow Up Recommendations  Skilled nursing-short term rehab (<3 hours/day) Can patient physically be transported by private vehicle: No   Assistance Recommended at Discharge Frequent or constant Supervision/Assistance  Patient can return home with the following A lot of help with walking and/or transfers;A lot of help with bathing/dressing/bathroom;Assistance with cooking/housework;Assist for transportation;Help with stairs or ramp for entrance   Equipment Recommendations  Rolling walker (2 wheels);BSC/3in1    Recommendations for Other Services       Precautions / Restrictions Precautions Precaution Comments: wound vac LLE Restrictions LLE Weight Bearing: Weight bearing as tolerated      Mobility  Bed Mobility Overal bed mobility: Needs Assistance Bed Mobility: Supine to Sit     Supine to sit: HOB elevated, Mod assist     General bed mobility comments: HOB 35 degrees, increased time, physical assist to roll and rise from elevated bed to full sitting toward left    Transfers Overall transfer level: Needs assistance   Transfers: Sit to/from Stand, Bed to chair/wheelchair/BSC Sit to Stand: Min assist, +2 safety/equipment, From elevated surface   Step pivot transfers: Min assist, +2 physical assistance       General transfer comment: pt able to stand from bed height elevated to home height with min assist. Min +2 with momentum to rise from chair boosted with blankets. Step grossly 2.5' from bed to chair. pt with partial knee buckling in standing with max cues for posture, proximity to RW and bringing surfaces under pt.    Ambulation/Gait               General Gait Details: unable   Stairs             Wheelchair Mobility    Modified Rankin (Stroke Patients Only)       Balance Overall balance assessment: Needs assistance Sitting-balance support: No upper extremity supported, Feet supported Sitting balance-Leahy Scale: Fair Sitting balance - Comments: sitting EOB without support   Standing balance support: Reliant on assistive device for balance, Bilateral upper extremity supported Standing balance-Leahy Scale: Poor Standing balance comment: RW in standing                            Cognition  Arousal/Alertness: Awake/alert Behavior During Therapy: Flat affect Overall Cognitive Status: Within Functional Limits for tasks assessed                                          Exercises General Exercises - Lower Extremity Short Arc Quad: AROM, Both, 10 reps, Seated    General Comments        Pertinent Vitals/Pain Pain Assessment Pain Score: 3  Pain Location: 3 in LLE, 5 in chest Pain Descriptors /  Indicators: Aching, Guarding Pain Intervention(s): Limited activity within patient's tolerance, Monitored during session, Repositioned    Home Living                          Prior Function            PT Goals (current goals can now be found in the care plan section) Progress towards PT goals: Progressing toward goals    Frequency    Min 3X/week      PT Plan Discharge plan needs to be updated    Co-evaluation              AM-PAC PT "6 Clicks" Mobility   Outcome Measure  Help needed turning from your back to your side while in a flat bed without using bedrails?: A Lot Help needed moving from lying on your back to sitting on the side of a flat bed without using bedrails?: A Lot Help needed moving to and from a bed to a chair (including a wheelchair)?: A Lot Help needed standing up from a chair using your arms (e.g., wheelchair or bedside chair)?: A Lot Help needed to walk in hospital room?: Total Help needed climbing 3-5 steps with a railing? : Total 6 Click Score: 10    End of Session Equipment Utilized During Treatment: Gait belt Activity Tolerance: Patient tolerated treatment well Patient left: in chair;with call bell/phone within reach;with chair alarm set Nurse Communication: Mobility status PT Visit Diagnosis: Other abnormalities of gait and mobility (R26.89);Pain;Muscle weakness (generalized) (M62.81)     Time: 3383-2919 PT Time Calculation (min) (ACUTE ONLY): 38 min  Charges:  $Therapeutic Exercise: 8-22 mins $Therapeutic Activity: 23-37 mins                     Ryan Garner, PT Acute Rehabilitation Services Office: [phone]    Ryan Garner 04/07/2022, 12:01 PM

## 2022-04-07 NOTE — Progress Notes (Signed)
PT Cancellation Note  Patient Details Name: Ryan Garner MRN: [id number] DOB: 1946-03-17   Cancelled Treatment:    Reason Eval/Treat Not Completed: Patient at procedure or test/unavailable (Echo)   Ingram Onnen B Tania Perrott 04/07/2022, 9:25 AM [place] Office: [phone]

## 2022-04-07 NOTE — Progress Notes (Signed)
Urinary catheter placed per MD order. Difficult placement due to severe penile swelling. Upon placement of catheter Pt had 880 mL output.

## 2022-04-08 DIAGNOSIS — I4821 Permanent atrial fibrillation: Secondary | ICD-10-CM | POA: Diagnosis not present

## 2022-04-08 DIAGNOSIS — I340 Nonrheumatic mitral (valve) insufficiency: Secondary | ICD-10-CM | POA: Diagnosis not present

## 2022-04-08 DIAGNOSIS — I4819 Other persistent atrial fibrillation: Secondary | ICD-10-CM | POA: Diagnosis not present

## 2022-04-08 DIAGNOSIS — N179 Acute kidney failure, unspecified: Secondary | ICD-10-CM | POA: Diagnosis not present

## 2022-04-08 DIAGNOSIS — L02416 Cutaneous abscess of left lower limb: Secondary | ICD-10-CM | POA: Diagnosis not present

## 2022-04-08 DIAGNOSIS — I350 Nonrheumatic aortic (valve) stenosis: Secondary | ICD-10-CM | POA: Diagnosis not present

## 2022-04-08 DIAGNOSIS — I5033 Acute on chronic diastolic (congestive) heart failure: Secondary | ICD-10-CM | POA: Diagnosis not present

## 2022-04-08 LAB — CBC WITH DIFFERENTIAL/PLATELET
Abs Immature Granulocytes: 0.1 10*3/uL — ABNORMAL HIGH (ref 0.00–0.07)
Basophils Absolute: 0 10*3/uL (ref 0.0–0.1)
Basophils Relative: 0 %
Eosinophils Absolute: 0.2 10*3/uL (ref 0.0–0.5)
Eosinophils Relative: 2 %
HCT: 32.5 % — ABNORMAL LOW (ref 39.0–52.0)
Hemoglobin: 10.9 g/dL — ABNORMAL LOW (ref 13.0–17.0)
Immature Granulocytes: 1 %
Lymphocytes Relative: 11 %
Lymphs Abs: 1.2 10*3/uL (ref 0.7–4.0)
MCH: 31.1 pg (ref 26.0–34.0)
MCHC: 33.5 g/dL (ref 30.0–36.0)
MCV: 92.9 fL (ref 80.0–100.0)
Monocytes Absolute: 1.2 10*3/uL — ABNORMAL HIGH (ref 0.1–1.0)
Monocytes Relative: 11 %
Neutro Abs: 8.2 10*3/uL — ABNORMAL HIGH (ref 1.7–7.7)
Neutrophils Relative %: 75 %
Platelets: 200 10*3/uL (ref 150–400)
RBC: 3.5 MIL/uL — ABNORMAL LOW (ref 4.22–5.81)
RDW: 14.4 % (ref 11.5–15.5)
WBC: 10.9 10*3/uL — ABNORMAL HIGH (ref 4.0–10.5)
nRBC: 0 % (ref 0.0–0.2)

## 2022-04-08 LAB — MAGNESIUM: Magnesium: 1.9 mg/dL (ref 1.7–2.4)

## 2022-04-08 LAB — RENAL FUNCTION PANEL
Albumin: 2.9 g/dL — ABNORMAL LOW (ref 3.5–5.0)
Anion gap: 10 (ref 5–15)
BUN: 51 mg/dL — ABNORMAL HIGH (ref 8–23)
CO2: 22 mmol/L (ref 22–32)
Calcium: 9.1 mg/dL (ref 8.9–10.3)
Chloride: 97 mmol/L — ABNORMAL LOW (ref 98–111)
Creatinine, Ser: 1.56 mg/dL — ABNORMAL HIGH (ref 0.61–1.24)
GFR, Estimated: 45 mL/min — ABNORMAL LOW (ref >60)
Glucose, Bld: 130 mg/dL — ABNORMAL HIGH (ref 70–99)
Phosphorus: 3.5 mg/dL (ref 2.5–4.6)
Potassium: 4.1 mmol/L (ref 3.5–5.1)
Sodium: 129 mmol/L — ABNORMAL LOW (ref 135–145)

## 2022-04-08 LAB — UREA NITROGEN, URINE: Urea Nitrogen, Ur: 482 mg/dL

## 2022-04-08 LAB — VANCOMYCIN, PEAK: Vancomycin Pk: 39 ug/mL (ref 30–40)

## 2022-04-08 NOTE — Progress Notes (Signed)
PROGRESS NOTE/Consult    Ryan Garner  QQV:[id number] DOB: Aug 06, 1945 DOA: 04/04/2022 PCP: [name], [name], MD    No chief complaint on file.   Brief Narrative:  Patient 76 year old gentleman history of hypertension, hyperlipidemia, CAD with history of STEMI status post PCI, PAH, rate controlled A-fib on anticoagulation, type 2 diabetes, morbid obesity who was admitted to orthopedic service, per Dr. Sharol Given for left leg cellulitis and abscess.  Patient recently hospitalized from 8/10 to 8/14 for recurrent cellulitis of the left leg at [facility] and failed outpatient management with Augmentin.  Patient noted to have been discharged on Doxy following hospitalization wound got worse with redness swelling and pain seen by PCP placed on cefdinir and subsequently transition to Bactrim.  Despite oral antibiotics wound worsening patient developed an abscess seen in the office by Dr. Sharol Given on 911, admitted placed on IV Vanco Flagyl and Rocephin taken to the OR on 04/06/2022.  During induction for intubation patient became bradycardic hypotensive received CPR temporary placed on pressors stabilized and he proceeded with debridement surgery.  Postoperatively patient noted to have been weaned off pressors extubated successfully.  Patient with some substernal chest discomfort.  Chest x-ray noted hazy opacification of the right mid to lower lung with possible right-sided effusion.  Hospitalist consulted for hypotension and bradycardia.  Cardiology consulted.  ID consulted and following.   Assessment & Plan:   Principal Problem:   Abscess of leg, left Active Problems:   Atrial fibrillation (HCC)   Transient hypotension   Acute kidney injury superimposed on chronic kidney disease (HCC)   Hyponatremia   Bradycardia   (HFpEF) heart failure with preserved ejection fraction (HCC)   Hypotension  #1 left lower extremity cellulitis with abscess -Status post I and D per Dr. Sharol Given 04/06/2022 with placement of  wound VAC. -Initial cultures from left leg debridement 04/04/2022 with no growth to date x3 days. -MRSA PCR negative. -Currently on IV vancomycin, Rocephin, metronidazole per ID recommendations. -Per ID recommended to change to daptomycin if no improvement with kidney function. -Per primary team/orthopedics Dr. Sharol Given. -Antibiotics per ID.  2.  Leukocytosis -Likely secondary to problem #1.  3.  Transient hypotension and bradycardia -Patient noted to be hypotensive and bradycardic for which CPR had to be started after patient given induction medications for intubation. -Patient extubated postprocedure, vasopressin weaned off. -Patient with complaints of chest soreness. -EKG with no significant changes. -Hypotension bradycardia likely secondary to induction of intubation. -Noted to have been on metoprolol 50 mg the morning prior to procedure. -Cardiac enzymes slightly elevated but flat. -2D echo with EF of 50 to 55%,NWMA, mild concentric LVH, intraventricular septum flattening in systole and diastole consistent with right ventricular pressure and volume overload, moderately reduced right ventricular systolic function, right ventricular size is moderately enlarged, severely elevated PA systolic pressure, severely dilated left atrial size, severely dilated right atrial size, severe MVR.  Moderate mitral annular calcification.  Moderate aortic valve stenosis.  Aortic dilatation noted. -Cardiology following.  4.  Acute respiratory failure secondary to acute on chronic diastolic CHF -Patient with volume overload on examination with 2-3+ bilateral pitting lower extremity edema, crackles noted on examination. -Patient with shortness of breath which she states is chronic. -Patient noted to be on Lasix 80 mg orally. -BNP elevated at 682.5. --2D echo with EF of 50 to 55%,NWMA, mild concentric LVH, intraventricular septum flattening in systole and diastole consistent with right ventricular pressure and  volume overload, moderately reduced right ventricular systolic function, right ventricular size  is moderately enlarged, severely elevated PA systolic pressure, severely dilated left atrial size, severely dilated right atrial size, severe MVR.  Moderate mitral annular calcification.  Moderate aortic valve stenosis.  Aortic dilatation noted. -Patient was on Lasix 40 mg IV every 12 hours (04/06/2022) with urine output of 1.050 L, Lasix dose increased to 60 mg IV every 12 hours per cardiology with urine output of 4.980 L over the past 24 hours. -Status post IV albumin twice daily on 04/07/2022 to aid with diuresis and as blood pressure was borderline.   -We will give IV albumin twice daily x2 doses.  -Per cardiology.  5.  AKI superimposed on CKD stage IIIa/urinary retention -Creatinine noted to be trending up to 1.83 with a BUN of 47 prior creatinine noted at 1.2 on 03/03/2022. -Renal function improving with diuresis creatinine down to 1.56. -Likely secondary to prerenal azotemia secondary to volume overload in the setting of possible postobstructive diuresis as patient with urinary retention. -Patient was of urinary retention per RN on 04/07/2022. -Foley catheter ordered to be placed. -Renal ultrasound with bilateral renal cortical thinning consistent with chronic medical renal disease, no hydronephrosis -Continue IV Lasix. -Strict I's and O's, daily weights. -Follow.  6.  Normocytic anemia -Hemoglobin at 10.9.   Follow H&H.  7.  Hyperkalemia -Resolved. -Potassium at 4.1.  8.  Hyponatremia -Likely secondary to hypervolemic hyponatremia. -Continue IV diuresis.  9.  Hypomagnesemia -Repleted. -Magnesium at 1.9.   DVT prophylaxis: Eliquis Code Status: Full Family Communication: Updated patient.  No family at bedside. Disposition: Per primary team  Status is: Inpatient Remains inpatient appropriate because: Severity of illness   Consultants:  Cardiology: Dr. Harriet Masson 04/06/2022 Infectious  disease: Dr. Candiss Norse 04/05/2022 Hospitalist: Dr. Tamala Julian 04/06/2022  Procedures:  Chest x-ray 04/06/2022 MRI left tibia 04/04/2022 Renal ultrasound 04/07/2022 pending 2D echo 04/07/2022 Left leg debridement with application of wound VAC per Dr. Sharol Given 04/06/2022  Antimicrobials:  IV Ancef 04/04/2022 x 1 dose IV Rocephin 04/04/2022>>>>04/11/2022 IV vancomycin 04/04/2022>>>> Oral Flagyl 04/05/2022>>>  Subjective: Sitting up at the side of the bed.  States some improvement with shortness of breath.  Still with complaints of chest soreness.   Asking to brush his teeth and get back in bed.   Objective: Vitals:   04/07/22 2330 04/08/22 0310 04/08/22 0400 04/08/22 0800  BP: 98/69 110/61 93/63 108/67  Pulse: 82 89 78 65  Resp: '14 10 14 14  '$ Temp: 98 F (36.7 C) 98.2 F (36.8 C)  97.8 F (36.6 C)  TempSrc: Oral Oral  Oral  SpO2: 96% 99% 98% 97%  Weight:      Height:        Intake/Output Summary (Last 24 hours) at 04/08/2022 1119 Last data filed at 04/08/2022 0639 Gross per 24 hour  Intake 150 ml  Output 4980 ml  Net -4830 ml    Filed Weights   04/04/22 0731 04/06/22 0833  Weight: 134.3 kg 134.3 kg    Examination:  General exam: Appears calm and comfortable  Respiratory system: Improving scattered diffuse crackles.  Fair air movement.  No wheezing.  Cardiovascular system: Irregularly irregular.  3+ bilateral lower extremity pitting edema.  Lower extremities less tight. Gastrointestinal system: Abdomen is soft, nontender, nondistended, positive bowel sounds.  No rebound.  No guarding.   Central nervous system: Alert and oriented. No focal neurological deficits. Extremities: Symmetric 5 x 5 power.  Postop dressing left lower extremity with wound VAC. Skin: No rashes, lesions or ulcers Psychiatry: Judgement and insight appear normal. Mood &  affect appropriate.     Data Reviewed: I have personally reviewed following labs and imaging studies  CBC: Recent Labs  Lab 04/05/22 0106  04/06/22 0112 04/07/22 0223 04/08/22 0125  WBC 11.8* 12.7* 12.4* 10.9*  NEUTROABS  --   --   --  8.2*  HGB 11.1* 11.4* 11.3* 10.9*  HCT 33.5* 33.7* 33.3* 32.5*  MCV 93.1 92.6 92.2 92.9  PLT 270 256 225 200     Basic Metabolic Panel: Recent Labs  Lab 04/05/22 0106 04/06/22 0112 04/06/22 1253 04/07/22 0223 04/08/22 0125  NA 128* 127* 129* 129* 129*  K 4.9 5.5* 4.8 5.0 4.1  CL 99 95* 96* 92* 97*  CO2 22 21* '22 24 22  '$ GLUCOSE 173* 160* 126* 154* 130*  BUN 36* 47* 48* 52* 51*  CREATININE 1.81* 1.83* 1.84* 1.84* 1.56*  CALCIUM 8.6* 9.1 8.9 9.1 9.1  MG  --   --  1.6* 2.0 1.9  PHOS  --   --   --   --  3.5     GFR: Estimated Creatinine Clearance: 52.3 mL/min (A) (by C-G formula based on SCr of 1.56 mg/dL (H)).  Liver Function Tests: Recent Labs  Lab 04/08/22 0125  ALBUMIN 2.9*    CBG: Recent Labs  Lab 04/04/22 0922 04/04/22 1021 04/06/22 0813 04/06/22 1047 04/06/22 1207  GLUCAP 114* 106* 130* 121* 119*      Recent Results (from the past 240 hour(s))  Aerobic/Anaerobic Culture w Gram Stain (surgical/deep wound)     Status: None (Preliminary result)   Collection Time: 04/04/22  9:44 AM   Specimen: PATH Soft tissue  Result Value Ref Range Status   Specimen Description LEG  Final   Special Requests  LEFT DEBRIDEMENT  Final   Gram Stain NO WBC SEEN NO ORGANISMS SEEN   Final   Culture   Final    NO GROWTH 3 DAYS NO ANAEROBES ISOLATED; CULTURE IN PROGRESS FOR 5 DAYS Performed at [facility], [place] [address] [place], [place] [address]    Report Status PENDING  Incomplete  Surgical pcr screen     Status: None   Collection Time: 04/05/22  8:11 PM   Specimen: Nasal Mucosa; Nasal Swab  Result Value Ref Range Status   MRSA, PCR NEGATIVE NEGATIVE Final   Staphylococcus aureus NEGATIVE NEGATIVE Final    Comment: (NOTE) The Xpert SA Assay (FDA approved for NASAL specimens in patients 28 years of age and older), is one component of a  comprehensive surveillance program. It is not intended to diagnose infection nor to guide or monitor treatment. Performed at [facility], [place] [address], [place], [place] [address]   MRSA Next Gen by PCR, Nasal     Status: None   Collection Time: 04/06/22 12:25 PM   Specimen: Nasal Mucosa; Nasal Swab  Result Value Ref Range Status   MRSA by PCR Next Gen NOT DETECTED NOT DETECTED Final    Comment: (NOTE) The GeneXpert MRSA Assay (FDA approved for NASAL specimens only), is one component of a comprehensive MRSA colonization surveillance program. It is not intended to diagnose MRSA infection nor to guide or monitor treatment for MRSA infections. Test performance is not FDA approved in patients less than 21 years old. Performed at [facility], [place] [address] [place], [place] [address]          Radiology Studies: US RENAL  Result Date: 04/07/2022 CLINICAL DATA:  Acute renal failure EXAM: RENAL / URINARY TRACT  ULTRASOUND COMPLETE COMPARISON:  None Available. FINDINGS: Right Kidney: Renal measurements: 10.1 x 5.2 x 6.1 cm = volume: 166 mL. Cortical thinning. Echogenicity within normal limits. No mass or hydronephrosis visualized. Left Kidney: Renal measurements: 11.6 x 5.9 x 5.9 cm = volume: 210 mL. Cortical thinning. Echogenicity within normal limits. No mass or hydronephrosis visualized. Bladder: Not visualized. Other: None. IMPRESSION: 1. Bilateral renal cortical thinning, in keeping with chronic medical renal disease. 2. No hydronephrosis. 3. Bladder is nonvisualized, presumably due to decompression. Electronically Signed   By: Delanna Ahmadi M.D.   On: 04/07/2022 15:55   ECHOCARDIOGRAM COMPLETE  Result Date: 04/07/2022    ECHOCARDIOGRAM REPORT   Patient Name:   Ryan Garner Date of Exam: 04/07/2022 Medical Rec #:  [id number]      Height:       68.0 in Accession #:    [id number]     Weight:       296.0 lb Date of Birth:  1946-03-16       BSA:          2.414 m Patient Age:     10 years       BP:           102/76 mmHg Patient Gender: M              HR:           67 bpm. Exam Location:  Inpatient Procedure: 2D Echo, Cardiac Doppler and Color Doppler Indications:    Brief cardiac arrest following wound debridement  History:        Patient has prior history of Echocardiogram examinations, most                 recent 06/02/2021. CHF, CAD and Previous Myocardial Infarction,                 Arrythmias:Atrial Fibrillation; Risk Factors:Hypertension,                 Diabetes and Dyslipidemia.  Sonographer:    Merrie Roof RDCS Referring Phys: Berniece Salines  Sonographer Comments: Patient is obese. Image acquisition challenging due to patient body habitus and patient supine. IMPRESSIONS  1. Left ventricular ejection fraction, by estimation, is 50 to 55%. The left ventricle has mildly decreased function. The left ventricle has no regional wall motion abnormalities. There is mild concentric left ventricular hypertrophy. Left ventricular diastolic function could not be evaluated. There is the interventricular septum is flattened in systole and diastole, consistent with right ventricular pressure and volume overload.  2. Right ventricular systolic function is moderately reduced. The right ventricular size is moderately enlarged. There is severely elevated pulmonary artery systolic pressure. The estimated right ventricular systolic pressure is 48.5 mmHg.  3. Left atrial size was severely dilated.  4. Right atrial size was severely dilated.  5. The mitral valve is grossly normal. Severe mitral valve regurgitation. Moderate mitral annular calcification.  6. The aortic valve is tricuspid. There is severe calcifcation of the aortic valve. There is severe thickening of the aortic valve. Aortic valve regurgitation is not visualized. Moderate aortic valve stenosis. Aortic valve mean gradient measures 17.0 mmHg. Aortic valve Vmax measures 2.71 m/s. Aortic valve acceleration time measures 120 msec.  7. Aortic  dilatation noted. There is mild dilatation of the ascending aorta, measuring 41 mm. There is mild dilatation of the aortic root, measuring 39 mm.  8. The inferior vena cava is dilated in size with <50% respiratory variability, suggesting right atrial  pressure of 15 mmHg. Comparison(s): Prior images reviewed side by side. The left ventricular function is worsened. Mitral insufficiency is worse. FINDINGS  Left Ventricle: Left ventricular ejection fraction, by estimation, is 50 to 55%. The left ventricle has mildly decreased function. The left ventricle has no regional wall motion abnormalities. The left ventricular internal cavity size was normal in size. There is mild concentric left ventricular hypertrophy. The interventricular septum is flattened in systole and diastole, consistent with right ventricular pressure and volume overload. Left ventricular diastolic function could not be evaluated due to atrial fibrillation. Left ventricular diastolic function could not be evaluated. Right Ventricle: The right ventricular size is moderately enlarged. Right vetricular wall thickness was not well visualized. Right ventricular systolic function is moderately reduced. There is severely elevated pulmonary artery systolic pressure. The tricuspid regurgitant velocity is 4.19 m/s, and with an assumed right atrial pressure of 15 mmHg, the estimated right ventricular systolic pressure is 65.7 mmHg. Left Atrium: Left atrial size was severely dilated. Right Atrium: Right atrial size was severely dilated. Pericardium: There is no evidence of pericardial effusion. Mitral Valve: The mitral valve is grossly normal. Moderate mitral annular calcification. Severe mitral valve regurgitation. Tricuspid Valve: The tricuspid valve is normal in structure. Tricuspid valve regurgitation is mild. Aortic Valve: Gradients underestimate the aortic stenosis severity due to low stroke volume. The aortic valve is tricuspid. There is severe calcifcation  of the aortic valve. There is severe thickening of the aortic valve. Aortic valve regurgitation is not visualized. Moderate aortic stenosis is present. Aortic valve mean gradient measures 17.0 mmHg. Aortic valve peak gradient measures 29.4 mmHg. Aortic valve area, by VTI measures 1.23 cm. Pulmonic Valve: The pulmonic valve was grossly normal. Pulmonic valve regurgitation is not visualized. Aorta: Aortic dilatation noted. There is mild dilatation of the ascending aorta, measuring 41 mm. There is mild dilatation of the aortic root, measuring 39 mm. Venous: A pattern of systolic flow reversal, suggestive of severe mitral regurgitation is recorded from the left upper pulmonary vein. The inferior vena cava is dilated in size with less than 50% respiratory variability, suggesting right atrial pressure of 15 mmHg. IAS/Shunts: No atrial level shunt detected by color flow Doppler.  LEFT VENTRICLE PLAX 2D LVIDd:         5.40 cm   Diastology LVIDs:         4.00 cm   LV e' lateral: 12.00 cm/s LV PW:         1.20 cm LV IVS:        1.20 cm LVOT diam:     2.40 cm LV SV:         74 LV SV Index:   31 LVOT Area:     4.52 cm  RIGHT VENTRICLE            IVC RV Basal diam:  4.70 cm    IVC diam: 2.50 cm RV Mid diam:    3.30 cm RV S prime:     6.09 cm/s TAPSE (M-mode): 1.2 cm LEFT ATRIUM              Index        RIGHT ATRIUM           Index LA diam:        5.20 cm  2.15 cm/m   RA Area:     28.40 cm LA Vol (A2C):   115.0 ml 47.63 ml/m  RA Volume:   95.20 ml  39.43 ml/m LA Vol (A4C):  107.0 ml 44.32 ml/m LA Biplane Vol: 114.0 ml 47.22 ml/m  AORTIC VALVE AV Area (Vmax):    1.38 cm AV Area (Vmean):   1.25 cm AV Area (VTI):     1.23 cm AV Vmax:           271.00 cm/s AV Vmean:          197.000 cm/s AV VTI:            0.602 m AV Peak Grad:      29.4 mmHg AV Mean Grad:      17.0 mmHg LVOT Vmax:         82.51 cm/s LVOT Vmean:        54.523 cm/s LVOT VTI:          0.163 m LVOT/AV VTI ratio: 0.27  AORTA Ao Root diam: 3.90 cm Ao Asc  diam:  4.00 cm MR Peak grad:    66.9 mmHg    TRICUSPID VALVE MR Mean grad:    47.0 mmHg    TR Peak grad:   70.2 mmHg MR Vmax:         409.00 cm/s  TR Vmax:        419.00 cm/s MR Vmean:        330.0 cm/s MR PISA:         8.31 cm     SHUNTS MR PISA Eff ROA: 68 mm       Systemic VTI:  0.16 m MR PISA Radius:  1.15 cm      Systemic Diam: 2.40 cm Dani Gobble Croitoru MD Electronically signed by Sanda Klein MD Signature Date/Time: 04/07/2022/2:16:06 PM    Final    DG CHEST PORT 1 VIEW  Result Date: 04/06/2022 CLINICAL DATA:  Respiratory abnormalities. Patient coded in the OR and now has chest pain with shortness of breath. EXAM: PORTABLE CHEST 1 VIEW COMPARISON:  03/01/2022 FINDINGS: Lungs are hypoinflated demonstrate airspace opacification over the right mid to lower lung which may be due to atelectasis or infection. Possible small amount right pleural fluid. Mild stable cardiomegaly. Remainder of the exam is unchanged. IMPRESSION: 1. Hazy opacification over the right mid to lower lung which may be due to atelectasis or infection. Possible small amount right pleural fluid. 2. Mild stable cardiomegaly. Electronically Signed   By: Marin Olp M.D.   On: 04/06/2022 11:41        Scheduled Meds:  apixaban  5 mg Oral BID   vitamin C  1,000 mg Oral Daily   atorvastatin  80 mg Oral Daily   Chlorhexidine Gluconate Cloth  6 each Topical Daily   clopidogrel  75 mg Oral Q breakfast   docusate sodium  100 mg Oral BID   furosemide  60 mg Intravenous BID   metoprolol tartrate  12.5 mg Oral BID   metroNIDAZOLE  500 mg Oral Q12H   nutrition supplement (JUVEN)  1 packet Oral BID BM   pantoprazole  40 mg Oral Daily   potassium chloride  20 mEq Oral Once per day on Sun Tue Thu Sat   And   potassium chloride  40 mEq Oral Q M,W,F   zinc sulfate  220 mg Oral Daily   Continuous Infusions:  cefTRIAXone (ROCEPHIN)  IV 2 g (04/08/22 1002)   methocarbamol (ROBAXIN) IV     vancomycin 1,000 mg (04/07/22 1758)     LOS:  4 days    Time spent: 40 minutes    Irine Seal, MD [facility]  To contact the attending provider between 7A-7P or the covering provider during after hours 7P-7A, please log into the web site [email or web address] and access using universal [place] password for that web site. If you do not have the password, please call the hospital operator.  04/08/2022, 11:19 AM

## 2022-04-08 NOTE — Progress Notes (Signed)
Pharmacy Antibiotic Note  Ryan Garner is a 76 y.o. male admitted on 04/04/2022 with chronic abscess left proximal tibia. S/p debridement 9/13 by ortho. No evidence of osteo on MRI. ID following. Pharmacy has been consulted for Vancomycin dosing for contaminated margins and high risk MRSA.   Renal function has started to improve (Scr 1.84>1.56). Can consider dapto in the future if renal function worsens. Will obtain levels to assess appropriateness of regimen.   Plan: Vancomycin 1,000 mg Q24H  Vancomycin levels ordered: VP: 9/17 @ 1930, VT: 9/18 @ 1530; goal AUC 400-550 Ceftriaxone 2 gm IV q24h  Metronidazole 500 mg PO Q12H  Follow renal function, culture data, clinical progress and antibiotic plans.   Height: '5\' 8"'$  (172.7 cm) Weight: 134.3 kg (296 lb) IBW/kg (Calculated) : 68.4  Temp (24hrs), Avg:97.9 F (36.6 C), Min:97.5 F (36.4 C), Max:98.2 F (36.8 C)  Recent Labs  Lab 04/05/22 0106 04/06/22 0112 04/06/22 1253 04/07/22 0223 04/07/22 1858 04/08/22 0125  WBC 11.8* 12.7*  --  12.4*  --  10.9*  CREATININE 1.81* 1.83* 1.84* 1.84*  --  1.56*  VANCOPEAK  --   --   --   --  49*  --     Estimated Creatinine Clearance: 52.3 mL/min (A) (by C-G formula based on SCr of 1.56 mg/dL (H)).    Allergies  Allergen Reactions   Flecainide     Per patient dizziness, shortness of breath, blurred vision   Rofecoxib Rash and Other (See Comments)    (Vioxx)    Antimicrobials this admission: Cefazolin x 1 pre-op 9/13 Vancomycin 9/13 >> Ceftriaxone 9/13 >> Metronidazole IV 9/13 >>  Microbiology results: 9/13 surgical culture (tissue): NGTD x3   Thank you for allowing pharmacy to be a part of this patient's care.  Gena Fray, PharmD PGY1 Pharmacy Resident   04/08/2022 12:00 PM

## 2022-04-08 NOTE — Anesthesia Postprocedure Evaluation (Signed)
Anesthesia Post Note  Patient: Ryan Garner  Procedure(s) Performed: LEFT LEG DEBRIDEMENT (Left: Leg Lower)     Patient location during evaluation: ICU Anesthesia Type: General Level of consciousness: awake Pain management: pain level controlled Vital Signs Assessment: post-procedure vital signs reviewed and stable Respiratory status: spontaneous breathing, nonlabored ventilation, respiratory function stable and patient connected to nasal cannula oxygen Cardiovascular status: blood pressure returned to baseline and stable Postop Assessment: no apparent nausea or vomiting Anesthetic complications: yes (Severe intraoperative hypotensiion and bradycardia with administration of chest compressions) Comments: Multiple unsuccessful attempts made to update spouse via telephone   No notable events documented.  Last Vitals:  Vitals:   04/08/22 1643 04/08/22 2000  BP:  104/69  Pulse:  92  Resp:  20  Temp: 36.7 C 36.8 C  SpO2:  98%    Last Pain:  Vitals:   04/08/22 2000  TempSrc: Oral  PainSc: 0-No pain                 Albena Comes P Brocha Gilliam

## 2022-04-08 NOTE — TOC Initial Note (Addendum)
Transition of Care [facility]) - Initial/Assessment Note    Patient Details  Name: Ryan Garner MRN: [id number] Date of Birth: 09-20-1945  Transition of Care [facility]) CM/SW Contact:    [name], LCSW Phone Number: 04/08/2022, 11:53 AM  Clinical Narrative:                  CSW met with patient to discuss PT recommendation of a SNF. Patient was aware of recommendation and does not agree to going to SNF at this time. Pt explains that he wants to return home with his wife. CSW inquires if she can assist his care and he states that he is agreeable to [facility].CSW asked again to be sure of pt's of DC plan. Pt explains that he has had [facility] before however cannot remember agency name. Pt verified that he has a upright walker, wheel chair and a lift. CSW verified physical address [address] [place]  [address]. CSW informed pt that that if he changes his mind to alert the RN so [facility] department could be alerted.  TOC team will continue to assist with discharge planning needs.      Expected Discharge Plan: [place] Barriers to Discharge: Continued Medical Work up   Patient Goals and CMS Choice        Expected Discharge Plan and Services Expected Discharge Plan: [place]       Living arrangements for the past 2 months: Single Family Home                                      Prior Living Arrangements/Services Living arrangements for the past 2 months: Single Family Home Lives with:: Self, Spouse              Current home services: DME    Activities of Daily Living Home Assistive Devices/Equipment: Blood pressure cuff, Scales, Cane (specify quad or straight) ADL Screening (condition at time of admission) Patient's cognitive ability adequate to safely complete daily activities?: Yes Is the patient deaf or have difficulty hearing?: No Does the patient have difficulty seeing, even when wearing glasses/contacts?: No Does the patient have  difficulty concentrating, remembering, or making decisions?: No Patient able to express need for assistance with ADLs?: Yes Does the patient have difficulty dressing or bathing?: No Independently performs ADLs?: Yes (appropriate for developmental age) Does the patient have difficulty walking or climbing stairs?: Yes (gets SOB) Weakness of Legs: None Weakness of Arms/Hands: None  Permission Sought/Granted      Share Information with NAME: [name]  Permission granted to share info w AGENCY: [place] granted to share info w Relationship: spouse  Permission granted to share info w Contact Information: [phone]  Emotional Assessment Appearance:: Appears stated age Attitude/Demeanor/Rapport: Engaged Affect (typically observed): Accepting Orientation: : Oriented to Self, Oriented to Place, Oriented to  Time, Oriented to Situation      Admission diagnosis:  Abscess of leg, left [L02.416] Patient Active Problem List   Diagnosis Date Noted   Hypotension    Transient hypotension 04/06/2022   Bradycardia 04/06/2022   (HFpEF) heart failure with preserved ejection fraction ([place]) 04/06/2022   Abscess of leg, left 04/04/2022   Cellulitis of left lower extremity 03/02/2022   Chronic diastolic CHF (congestive heart failure) ([place]) 03/02/2022   Cellulitis of left leg 03/02/2022   Foot callus 12/15/2021   Other  secondary pulmonary hypertension ([place]) 08/30/2021   COPD (chronic obstructive pulmonary disease) ([place]) 08/30/2021   Coronary artery disease due to type 2 diabetes mellitus ([place]) 07/21/2020   CHF (congestive heart failure) ([place]) 07/21/2020   History of ST elevation myocardial infarction (STEMI) 04/30/2020   Atrial fibrillation ([place]) 02/29/2020   Acute kidney injury superimposed on chronic kidney disease ([place]) 02/28/2020   Hyponatremia 02/28/2020   Lumbar radiculopathy 03/24/2019   Morbid obesity ([place]) 09/04/2018   Lung nodules 01/17/2017   Ascending aortic aneurysm 01/17/2017    Type 2 diabetes mellitus ([place]) 03/20/2016   Tear of lateral meniscus of right knee 11/19/2014   Hypertension    Hyperlipidemia    Metabolic syndrome    Osteoarthritis of both knees 07/06/2013   Acquired spondylolisthesis 03/06/2013   Degeneration of lumbar intervertebral disc 03/06/2013   Lumbar spondylosis 03/06/2013   Spinal stenosis of lumbar region 03/06/2013   PCP:  [name], [name], MD Pharmacy:   [place], [place] [address] Phone: [phone] Fax: [place] [address], [place] [address] [place] [address] Phone: [phone] Fax: [phone]  [facility] [address] [address] Phone: [phone] Fax: [phone]     Social Determinants of Health (SDOH) Interventions    Readmission Risk Interventions     No data to display

## 2022-04-08 NOTE — Progress Notes (Signed)
Mobility Specialist Progress Note:   04/08/22 1032  Mobility  Activity Dangled on edge of bed  Level of Assistance Moderate assist, patient does 50-74%  Assistive Device None  Activity Response Tolerated well  $Mobility charge 1 Mobility   Pt needing help scooting EOB. ModA to scoot EOB. Left EOB with call bell in reach and all needs met.   [facility] Surveyor, mining Chat only

## 2022-04-08 NOTE — Progress Notes (Signed)
Progress Note  Patient Name: Ryan Garner Date of Encounter: 04/08/2022  Primary Cardiologist: Dorris Carnes, MD  Subjective   Still feels somewhat full in his chest, breathing appears less labored today.  Chronic leg swelling present.  Inpatient Medications    Scheduled Meds:  apixaban  5 mg Oral BID   vitamin C  1,000 mg Oral Daily   atorvastatin  80 mg Oral Daily   Chlorhexidine Gluconate Cloth  6 each Topical Daily   clopidogrel  75 mg Oral Q breakfast   docusate sodium  100 mg Oral BID   furosemide  60 mg Intravenous BID   metoprolol tartrate  12.5 mg Oral BID   metroNIDAZOLE  500 mg Oral Q12H   nutrition supplement (JUVEN)  1 packet Oral BID BM   pantoprazole  40 mg Oral Daily   potassium chloride  20 mEq Oral Once per day on Sun Tue Thu Sat   And   potassium chloride  40 mEq Oral Q M,W,F   zinc sulfate  220 mg Oral Daily   Continuous Infusions:  cefTRIAXone (ROCEPHIN)  IV 2 g (04/07/22 1253)   methocarbamol (ROBAXIN) IV     vancomycin 1,000 mg (04/07/22 1758)   PRN Meds: acetaminophen, alum & mag hydroxide-simeth, bisacodyl, bisacodyl, guaiFENesin-dextromethorphan, hydrALAZINE, HYDROmorphone (DILAUDID) injection, labetalol, magnesium citrate, methocarbamol **OR** methocarbamol (ROBAXIN) IV, metoCLOPramide **OR** metoCLOPramide (REGLAN) injection, metoprolol tartrate, ondansetron **OR** ondansetron (ZOFRAN) IV, oxyCODONE, oxyCODONE, phenol, polyethylene glycol   Vital Signs    Vitals:   04/07/22 2330 04/08/22 0310 04/08/22 0400 04/08/22 0800  BP: 98/69 110/61 93/63 108/67  Pulse: 82 89 78 65  Resp: '14 10 14 14  '$ Temp: 98 F (36.7 C) 98.2 F (36.8 C)  97.8 F (36.6 C)  TempSrc: Oral Oral  Oral  SpO2: 96% 99% 98% 97%  Weight:      Height:        Intake/Output Summary (Last 24 hours) at 04/08/2022 1000 Last data filed at 04/08/2022 0639 Gross per 24 hour  Intake 150 ml  Output 5080 ml  Net -4930 ml   Filed Weights   04/04/22 0731 04/06/22 0833   Weight: 134.3 kg 134.3 kg    Telemetry    Atrial fibrillation.  Personally reviewed.  ECG    An ECG dated 04/06/2022 was personally reviewed today and demonstrated:  Atrial fibrillation with rightward axis and diffuse nonspecific ST-T changes.  Physical Exam   GEN: No acute distress.   Neck: No JVD. Cardiac: Irregularly irregular, 2/6 apical systolic murmur.  Respiratory: Decreased breath sounds to bases. GI: Soft, nontender, bowel sounds present. MS: Chronic appearing leg edema; left leg wound VAC in place. Neuro:  Nonfocal. Psych: Alert and oriented x 3. Normal affect.  Labs    Chemistry Recent Labs  Lab 04/06/22 1253 04/07/22 0223 04/08/22 0125  NA 129* 129* 129*  K 4.8 5.0 4.1  CL 96* 92* 97*  CO2 '22 24 22  '$ GLUCOSE 126* 154* 130*  BUN 48* 52* 51*  CREATININE 1.84* 1.84* 1.56*  CALCIUM 8.9 9.1 9.1  ALBUMIN  --   --  2.9*  GFRNONAA 37* 37* 45*  ANIONGAP '11 13 10     '$ Hematology Recent Labs  Lab 04/06/22 0112 04/07/22 0223 04/08/22 0125  WBC 12.7* 12.4* 10.9*  RBC 3.64* 3.61* 3.50*  HGB 11.4* 11.3* 10.9*  HCT 33.7* 33.3* 32.5*  MCV 92.6 92.2 92.9  MCH 31.3 31.3 31.1  MCHC 33.8 33.9 33.5  RDW 14.1 14.2 14.4  PLT 256 225 200    Cardiac Enzymes Recent Labs  Lab 04/06/22 1253 04/06/22 1451  TROPONINIHS 21* 27*    BNP Recent Labs  Lab 04/06/22 1253  BNP 682.5*      Radiology    US RENAL  Result Date: 04/07/2022 CLINICAL DATA:  Acute renal failure EXAM: RENAL / URINARY TRACT ULTRASOUND COMPLETE COMPARISON:  None Available. FINDINGS: Right Kidney: Renal measurements: 10.1 x 5.2 x 6.1 cm = volume: 166 mL. Cortical thinning. Echogenicity within normal limits. No mass or hydronephrosis visualized. Left Kidney: Renal measurements: 11.6 x 5.9 x 5.9 cm = volume: 210 mL. Cortical thinning. Echogenicity within normal limits. No mass or hydronephrosis visualized. Bladder: Not visualized. Other: None. IMPRESSION: 1. Bilateral renal cortical thinning, in  keeping with chronic medical renal disease. 2. No hydronephrosis. 3. Bladder is nonvisualized, presumably due to decompression. Electronically Signed   By: Delanna Ahmadi M.D.   On: 04/07/2022 15:55   ECHOCARDIOGRAM COMPLETE  Result Date: 04/07/2022    ECHOCARDIOGRAM REPORT   Patient Name:   Ryan Garner Date of Exam: 04/07/2022 Medical Rec #:  [id number]      Height:       68.0 in Accession #:    [id number]     Weight:       296.0 lb Date of Birth:  Dec 09, 1945       BSA:          2.414 m Patient Age:    76 years       BP:           102/76 mmHg Patient Gender: M              HR:           67 bpm. Exam Location:  Inpatient Procedure: 2D Echo, Cardiac Doppler and Color Doppler Indications:    Brief cardiac arrest following wound debridement  History:        Patient has prior history of Echocardiogram examinations, most                 recent 06/02/2021. CHF, CAD and Previous Myocardial Infarction,                 Arrythmias:Atrial Fibrillation; Risk Factors:Hypertension,                 Diabetes and Dyslipidemia.  Sonographer:    Merrie Roof RDCS Referring Phys: Berniece Salines  Sonographer Comments: Patient is obese. Image acquisition challenging due to patient body habitus and patient supine. IMPRESSIONS  1. Left ventricular ejection fraction, by estimation, is 50 to 55%. The left ventricle has mildly decreased function. The left ventricle has no regional wall motion abnormalities. There is mild concentric left ventricular hypertrophy. Left ventricular diastolic function could not be evaluated. There is the interventricular septum is flattened in systole and diastole, consistent with right ventricular pressure and volume overload.  2. Right ventricular systolic function is moderately reduced. The right ventricular size is moderately enlarged. There is severely elevated pulmonary artery systolic pressure. The estimated right ventricular systolic pressure is 27.7 mmHg.  3. Left atrial size was severely dilated.  4.  Right atrial size was severely dilated.  5. The mitral valve is grossly normal. Severe mitral valve regurgitation. Moderate mitral annular calcification.  6. The aortic valve is tricuspid. There is severe calcifcation of the aortic valve. There is severe thickening of the aortic valve. Aortic valve regurgitation is not visualized. Moderate aortic valve stenosis. Aortic valve mean gradient  measures 17.0 mmHg. Aortic valve Vmax measures 2.71 m/s. Aortic valve acceleration time measures 120 msec.  7. Aortic dilatation noted. There is mild dilatation of the ascending aorta, measuring 41 mm. There is mild dilatation of the aortic root, measuring 39 mm.  8. The inferior vena cava is dilated in size with <50% respiratory variability, suggesting right atrial pressure of 15 mmHg. Comparison(s): Prior images reviewed side by side. The left ventricular function is worsened. Mitral insufficiency is worse. FINDINGS  Left Ventricle: Left ventricular ejection fraction, by estimation, is 50 to 55%. The left ventricle has mildly decreased function. The left ventricle has no regional wall motion abnormalities. The left ventricular internal cavity size was normal in size. There is mild concentric left ventricular hypertrophy. The interventricular septum is flattened in systole and diastole, consistent with right ventricular pressure and volume overload. Left ventricular diastolic function could not be evaluated due to atrial fibrillation. Left ventricular diastolic function could not be evaluated. Right Ventricle: The right ventricular size is moderately enlarged. Right vetricular wall thickness was not well visualized. Right ventricular systolic function is moderately reduced. There is severely elevated pulmonary artery systolic pressure. The tricuspid regurgitant velocity is 4.19 m/s, and with an assumed right atrial pressure of 15 mmHg, the estimated right ventricular systolic pressure is 95.6 mmHg. Left Atrium: Left atrial size  was severely dilated. Right Atrium: Right atrial size was severely dilated. Pericardium: There is no evidence of pericardial effusion. Mitral Valve: The mitral valve is grossly normal. Moderate mitral annular calcification. Severe mitral valve regurgitation. Tricuspid Valve: The tricuspid valve is normal in structure. Tricuspid valve regurgitation is mild. Aortic Valve: Gradients underestimate the aortic stenosis severity due to low stroke volume. The aortic valve is tricuspid. There is severe calcifcation of the aortic valve. There is severe thickening of the aortic valve. Aortic valve regurgitation is not visualized. Moderate aortic stenosis is present. Aortic valve mean gradient measures 17.0 mmHg. Aortic valve peak gradient measures 29.4 mmHg. Aortic valve area, by VTI measures 1.23 cm. Pulmonic Valve: The pulmonic valve was grossly normal. Pulmonic valve regurgitation is not visualized. Aorta: Aortic dilatation noted. There is mild dilatation of the ascending aorta, measuring 41 mm. There is mild dilatation of the aortic root, measuring 39 mm. Venous: A pattern of systolic flow reversal, suggestive of severe mitral regurgitation is recorded from the left upper pulmonary vein. The inferior vena cava is dilated in size with less than 50% respiratory variability, suggesting right atrial pressure of 15 mmHg. IAS/Shunts: No atrial level shunt detected by color flow Doppler.  LEFT VENTRICLE PLAX 2D LVIDd:         5.40 cm   Diastology LVIDs:         4.00 cm   LV e' lateral: 12.00 cm/s LV PW:         1.20 cm LV IVS:        1.20 cm LVOT diam:     2.40 cm LV SV:         74 LV SV Index:   31 LVOT Area:     4.52 cm  RIGHT VENTRICLE            IVC RV Basal diam:  4.70 cm    IVC diam: 2.50 cm RV Mid diam:    3.30 cm RV S prime:     6.09 cm/s TAPSE (M-mode): 1.2 cm LEFT ATRIUM              Index  RIGHT ATRIUM           Index LA diam:        5.20 cm  2.15 cm/m   RA Area:     28.40 cm LA Vol (A2C):   115.0 ml 47.63  ml/m  RA Volume:   95.20 ml  39.43 ml/m LA Vol (A4C):   107.0 ml 44.32 ml/m LA Biplane Vol: 114.0 ml 47.22 ml/m  AORTIC VALVE AV Area (Vmax):    1.38 cm AV Area (Vmean):   1.25 cm AV Area (VTI):     1.23 cm AV Vmax:           271.00 cm/s AV Vmean:          197.000 cm/s AV VTI:            0.602 m AV Peak Grad:      29.4 mmHg AV Mean Grad:      17.0 mmHg LVOT Vmax:         82.51 cm/s LVOT Vmean:        54.523 cm/s LVOT VTI:          0.163 m LVOT/AV VTI ratio: 0.27  AORTA Ao Root diam: 3.90 cm Ao Asc diam:  4.00 cm MR Peak grad:    66.9 mmHg    TRICUSPID VALVE MR Mean grad:    47.0 mmHg    TR Peak grad:   70.2 mmHg MR Vmax:         409.00 cm/s  TR Vmax:        419.00 cm/s MR Vmean:        330.0 cm/s MR PISA:         8.31 cm     SHUNTS MR PISA Eff ROA: 68 mm       Systemic VTI:  0.16 m MR PISA Radius:  1.15 cm      Systemic Diam: 2.40 cm Dani Gobble Croitoru MD Electronically signed by Sanda Klein MD Signature Date/Time: 04/07/2022/2:16:06 PM    Final    DG CHEST PORT 1 VIEW  Result Date: 04/06/2022 CLINICAL DATA:  Respiratory abnormalities. Patient coded in the OR and now has chest pain with shortness of breath. EXAM: PORTABLE CHEST 1 VIEW COMPARISON:  03/01/2022 FINDINGS: Lungs are hypoinflated demonstrate airspace opacification over the right mid to lower lung which may be due to atelectasis or infection. Possible small amount right pleural fluid. Mild stable cardiomegaly. Remainder of the exam is unchanged. IMPRESSION: 1. Hazy opacification over the right mid to lower lung which may be due to atelectasis or infection. Possible small amount right pleural fluid. 2. Mild stable cardiomegaly. Electronically Signed   By: Marin Olp M.D.   On: 04/06/2022 11:41     Assessment & Plan    1.  Episode of bradycardia and hypotension following intubation and with induction of anesthesia per chart review.  Received transient resuscitative efforts in the OR and was otherwise able to complete operation.  He has had  no subsequent events under observation on the floor.  2.  Permanent atrial fibrillation, on Eliquis for stroke prophylaxis.  Lopressor resumed yesterday at lower dose and heart rate is adequately controlled at this time with no recurrent bradycardia.  3.  CKD stage IIIb, creatinine down to 1.56 with potassium 4.1.  4.  Left leg cellulitis with abscess, continues on antibiotic therapy and is now status post left leg debridement with wound VAC in place.  5.  HFpEF with fluid overload, also complicated by  valvular heart disease.  Echocardiogram from yesterday revealed LVEF 50 to 55% with mild LVH, also moderate RV dysfunction and severe pulmonary hypertension with estimated RVSP 85 mmHg.  He has severe biatrial enlargement, severe mitral regurgitation, and moderate aortic regurgitation.  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  Continue current dose of IV Lasix, had approximately 4900 cc out more than in last 24 hours and with Foley catheter in place.  Tolerating resumption of Lopressor at 12.5 mg twice daily.  Continue Eliquis for stroke prophylaxis.  Signed, Rozann Lesches, MD  04/08/2022, 10:00 AM

## 2022-04-08 NOTE — Progress Notes (Signed)
Subjective: 2 Days Post-Op Procedure(s) (LRB): LEFT LEG DEBRIDEMENT (Left) Patient reports pain as mild.    Objective: Vital signs in last 24 hours: Temp:  [97.5 F (36.4 C)-98.2 F (36.8 C)] 97.8 F (36.6 C) (09/17 0800) Pulse Rate:  [65-89] 65 (09/17 0800) Resp:  [10-18] 14 (09/17 0800) BP: (93-110)/(60-72) 108/67 (09/17 0800) SpO2:  [94 %-99 %] 97 % (09/17 0800)  Intake/Output from previous day: 09/16 0701 - 09/17 0700 In: 150 [P.O.:150] Out: 5080 [Urine:4980; Drains:100] Intake/Output this shift: No intake/output data recorded.  Recent Labs    04/06/22 0112 04/07/22 0223 04/08/22 0125  HGB 11.4* 11.3* 10.9*   Recent Labs    04/07/22 0223 04/08/22 0125  WBC 12.4* 10.9*  RBC 3.61* 3.50*  HCT 33.3* 32.5*  PLT 225 200   Recent Labs    04/07/22 0223 04/08/22 0125  NA 129* 129*  K 5.0 4.1  CL 92* 97*  CO2 24 22  BUN 52* 51*  CREATININE 1.84* 1.56*  GLUCOSE 154* 130*  CALCIUM 9.1 9.1   No results for input(s): "LABPT", "INR" in the last 72 hours.  Neurologically intact Neurovascular intact Sensation intact distally Intact pulses distally Dorsiflexion/Plantar flexion intact Incision: dressing C/D/I Compartment soft Wound vac in place with approx 300 cc drainage in canister   Assessment/Plan: 2 Days Post-Op Procedure(s) (LRB): LEFT LEG DEBRIDEMENT (Left) Up with therapy WBAT LLE Continue with wound vac Continue recs per hospitalist, cardiology and ID.  Once cleared, may d/c home.        Aundra Dubin 04/08/2022, 10:17 AM

## 2022-04-09 ENCOUNTER — Encounter (HOSPITAL_COMMUNITY): Payer: Self-pay | Admitting: Orthopedic Surgery

## 2022-04-09 DIAGNOSIS — N179 Acute kidney failure, unspecified: Secondary | ICD-10-CM | POA: Diagnosis not present

## 2022-04-09 DIAGNOSIS — I5033 Acute on chronic diastolic (congestive) heart failure: Secondary | ICD-10-CM | POA: Diagnosis not present

## 2022-04-09 DIAGNOSIS — I4821 Permanent atrial fibrillation: Secondary | ICD-10-CM | POA: Diagnosis not present

## 2022-04-09 DIAGNOSIS — L02416 Cutaneous abscess of left lower limb: Secondary | ICD-10-CM | POA: Diagnosis not present

## 2022-04-09 LAB — CBC WITH DIFFERENTIAL/PLATELET
Abs Immature Granulocytes: 0.11 10*3/uL — ABNORMAL HIGH (ref 0.00–0.07)
Basophils Absolute: 0 10*3/uL (ref 0.0–0.1)
Basophils Relative: 0 %
Eosinophils Absolute: 0.2 10*3/uL (ref 0.0–0.5)
Eosinophils Relative: 2 %
HCT: 34.3 % — ABNORMAL LOW (ref 39.0–52.0)
Hemoglobin: 11.8 g/dL — ABNORMAL LOW (ref 13.0–17.0)
Immature Granulocytes: 1 %
Lymphocytes Relative: 7 %
Lymphs Abs: 0.9 10*3/uL (ref 0.7–4.0)
MCH: 31.2 pg (ref 26.0–34.0)
MCHC: 34.4 g/dL (ref 30.0–36.0)
MCV: 90.7 fL (ref 80.0–100.0)
Monocytes Absolute: 0.9 10*3/uL (ref 0.1–1.0)
Monocytes Relative: 8 %
Neutro Abs: 9.7 10*3/uL — ABNORMAL HIGH (ref 1.7–7.7)
Neutrophils Relative %: 82 %
Platelets: 237 10*3/uL (ref 150–400)
RBC: 3.78 MIL/uL — ABNORMAL LOW (ref 4.22–5.81)
RDW: 14.9 % (ref 11.5–15.5)
WBC: 11.9 10*3/uL — ABNORMAL HIGH (ref 4.0–10.5)
nRBC: 0 % (ref 0.0–0.2)

## 2022-04-09 LAB — RENAL FUNCTION PANEL
Albumin: 2.7 g/dL — ABNORMAL LOW (ref 3.5–5.0)
Anion gap: 11 (ref 5–15)
BUN: 41 mg/dL — ABNORMAL HIGH (ref 8–23)
CO2: 27 mmol/L (ref 22–32)
Calcium: 9.3 mg/dL (ref 8.9–10.3)
Chloride: 96 mmol/L — ABNORMAL LOW (ref 98–111)
Creatinine, Ser: 1.22 mg/dL (ref 0.61–1.24)
GFR, Estimated: >60 mL/min (ref >60)
Glucose, Bld: 164 mg/dL — ABNORMAL HIGH (ref 70–99)
Phosphorus: 3.1 mg/dL (ref 2.5–4.6)
Potassium: 3.7 mmol/L (ref 3.5–5.1)
Sodium: 134 mmol/L — ABNORMAL LOW (ref 135–145)

## 2022-04-09 LAB — MAGNESIUM: Magnesium: 1.4 mg/dL — ABNORMAL LOW (ref 1.7–2.4)

## 2022-04-09 MED ORDER — AMOXICILLIN-POT CLAVULANATE 875-125 MG PO TABS: 1.0000 | ORAL_TABLET | Freq: Two times a day (BID) | ORAL | Status: DC

## 2022-04-09 MED ORDER — FLEET ENEMA 7-19 GM/118ML RE ENEM
1.0000 | ENEMA | Freq: Once | RECTAL | Status: AC
  Administered 2022-04-09: 1 via RECTAL
  Filled 2022-04-09: qty 1

## 2022-04-09 MED ORDER — DOXYCYCLINE HYCLATE 100 MG PO TABS
100.0000 mg | ORAL_TABLET | Freq: Two times a day (BID) | ORAL | Status: DC
  Administered 2022-04-09 – 2022-04-11 (×4): 100 mg via ORAL
  Filled 2022-04-09 (×4): qty 1

## 2022-04-09 MED ORDER — MAGNESIUM SULFATE 4 GM/100ML IV SOLN
4.0000 g | Freq: Once | INTRAVENOUS | Status: AC
  Administered 2022-04-09: 4 g via INTRAVENOUS
  Filled 2022-04-09: qty 100

## 2022-04-09 MED ORDER — SODIUM CHLORIDE 0.9 % IV SOLN
2.0000 g | Freq: Three times a day (TID) | INTRAVENOUS | Status: DC
  Administered 2022-04-09 – 2022-04-10 (×2): 2 g via INTRAVENOUS
  Filled 2022-04-09 (×2): qty 12.5

## 2022-04-09 NOTE — Care Management Important Message (Signed)
Important Message  Patient Details  Name: Ryan Garner MRN: [id number] Date of Birth: [birth year]   Medicare Important Message Given:  Yes     Shelda Altes 04/09/2022, 8:57 AM

## 2022-04-09 NOTE — Progress Notes (Signed)
PROGRESS NOTE/Consult    Ryan Garner  MVE:[id number] DOB: 24-Feb-1946 DOA: 04/04/2022 PCP: [name], [name], MD    No chief complaint on file.   Brief Narrative:  Patient 76 year old gentleman history of hypertension, hyperlipidemia, CAD with history of STEMI status post PCI, PAH, rate controlled A-fib on anticoagulation, type 2 diabetes, morbid obesity who was admitted to orthopedic service, per Dr. Sharol Given for left leg cellulitis and abscess.  Patient recently hospitalized from 8/10 to 8/14 for recurrent cellulitis of the left leg at [facility] and failed outpatient management with Augmentin.  Patient noted to have been discharged on Doxy following hospitalization wound got worse with redness swelling and pain seen by PCP placed on cefdinir and subsequently transition to Bactrim.  Despite oral antibiotics wound worsening patient developed an abscess seen in the office by Dr. Sharol Given on 911, admitted placed on IV Vanco Flagyl and Rocephin taken to the OR on 04/06/2022.  During induction for intubation patient became bradycardic hypotensive received CPR temporary placed on pressors stabilized and he proceeded with debridement surgery.  Postoperatively patient noted to have been weaned off pressors extubated successfully.  Patient with some substernal chest discomfort.  Chest x-ray noted hazy opacification of the right mid to lower lung with possible right-sided effusion.  Hospitalist consulted for hypotension and bradycardia.  Cardiology consulted.  ID consulted and following.   Assessment & Plan:   Principal Problem:   Abscess of leg, left Active Problems:   Atrial fibrillation (HCC)   Transient hypotension   Acute kidney injury superimposed on chronic kidney disease (HCC)   Hyponatremia   Bradycardia   (HFpEF) heart failure with preserved ejection fraction (HCC)   Hypotension  #1 left lower extremity cellulitis with abscess -Status post I and D per Dr. Sharol Given 04/06/2022 with placement of  wound VAC. -Initial cultures from left leg debridement 04/04/2022 with no growth to date x3 days. -MRSA PCR negative. -Currently on IV vancomycin, Rocephin, metronidazole per ID recommendations. -Per ID recommended to change to daptomycin if no improvement with kidney function. -Per primary team/orthopedics Dr. Sharol Given. -Antibiotics per ID.  2.  Leukocytosis -Likely secondary to problem #1. -Continue IV antibiotics as directed by ID.  3.  Transient hypotension and bradycardia -Patient noted to be hypotensive and bradycardic for which CPR had to be started after patient given induction medications for intubation. -Patient extubated postprocedure, vasopressin weaned off. -Patient with complaints of chest soreness. -EKG with no significant changes. -Hypotension bradycardia likely secondary to induction of intubation. -Noted to have been on metoprolol 50 mg the morning prior to procedure. -Cardiac enzymes slightly elevated but flat. -2D echo with EF of 50 to 55%,NWMA, mild concentric LVH, intraventricular septum flattening in systole and diastole consistent with right ventricular pressure and volume overload, moderately reduced right ventricular systolic function, right ventricular size is moderately enlarged, severely elevated PA systolic pressure, severely dilated left atrial size, severely dilated right atrial size, severe MVR.  Moderate mitral annular calcification.  Moderate aortic valve stenosis.  Aortic dilatation noted. -Cardiology following.  4.  Acute respiratory failure secondary to acute on chronic diastolic CHF -Patient with volume overload on examination with 3-4+ bilateral pitting lower extremity edema, crackles noted on examination. -Patient with shortness of breath which she states is chronic. -Patient noted to be on Lasix 80 mg orally. -BNP elevated at 682.5. --2D echo with EF of 50 to 55%,NWMA, mild concentric LVH, intraventricular septum flattening in systole and diastole  consistent with right ventricular pressure and volume overload, moderately reduced  right ventricular systolic function, right ventricular size is moderately enlarged, severely elevated PA systolic pressure, severely dilated left atrial size, severely dilated right atrial size, severe MVR.  Moderate mitral annular calcification.  Moderate aortic valve stenosis.  Aortic dilatation noted. -Patient was on Lasix 40 mg IV every 12 hours (04/06/2022) with urine output of 1.050 L, Lasix dose increased to 60 mg IV every 12 hours per cardiology with urine output of 8.450 L over the past 24 hours. -Status post IV albumin twice daily on 04/07/2022, 04/08/2022 to aid with diuresis and as blood pressure was borderline.   -Improving clinically however still significantly volume overloaded. -Continue IV Lasix. -Per cardiology.  5.  AKI superimposed on CKD stage IIIa/urinary retention -Creatinine noted to be trending up to 1.83 with a BUN of 47 prior creatinine noted at 1.2 on 03/03/2022. -Renal function improving with diuresis creatinine down to 1.22. -Likely secondary to prerenal azotemia secondary to volume overload in the setting of possible postobstructive diuresis as patient with urinary retention. -Patient was of urinary retention per RN on 04/07/2022. -Foley catheter ordered and placed. -Renal ultrasound with bilateral renal cortical thinning consistent with chronic medical renal disease, no hydronephrosis -Patient with urine output of 8.450 L over the past 24 hours. -Continue IV Lasix. -Strict I's and O's, daily weights. -Follow.  6.  Normocytic anemia -Hemoglobin at 11.8. Follow H&H.  7.  Hyperkalemia -Resolved. -Potassium at 3.7. -Monitor with diuresis.  8.  Hyponatremia -Likely secondary to hypervolemic hyponatremia. -Improving with diuresis. -Continue IV diuresis.  9.  Hypomagnesemia -Magnesium at 1.4 today. -Magnesium sulfate 4 g IV x1. -Repeat labs in the AM.   DVT prophylaxis:  Eliquis Code Status: Full Family Communication: Updated patient.  No family at bedside. Disposition: Per primary team  Status is: Inpatient Remains inpatient appropriate because: Severity of illness   Consultants:  Cardiology: Dr. Harriet Masson 04/06/2022 Infectious disease: Dr. Candiss Norse 04/05/2022 Hospitalist: Dr. Tamala Julian 04/06/2022  Procedures:  Chest x-ray 04/06/2022 MRI left tibia 04/04/2022 Renal ultrasound 04/07/2022 pending 2D echo 04/07/2022 Left leg debridement with application of wound VAC per Dr. Sharol Given 04/06/2022  Antimicrobials:  IV Ancef 04/04/2022 x 1 dose IV Rocephin 04/04/2022>>>>04/11/2022 IV vancomycin 04/04/2022>>>> Oral Flagyl 04/05/2022>>>  Subjective: Laying in bed.  Alert.  States overall feeling better.  Denies any significant shortness of breath.  No chest pain.  No abdominal pain.  Noted to have just received a fleets enema for constipation.   Objective: Vitals:   04/09/22 0345 04/09/22 0505 04/09/22 0844 04/09/22 1054  BP: (!) 121/57  (!) 94/50 120/65  Pulse: 78  79 92  Resp: '11  19 19  '$ Temp: 97.7 F (36.5 C)  98.5 F (36.9 C) 98.4 F (36.9 C)  TempSrc: Oral  Axillary Oral  SpO2: 95%  97% 97%  Weight:  (!) 136.5 kg    Height:        Intake/Output Summary (Last 24 hours) at 04/09/2022 1137 Last data filed at 04/09/2022 1100 Gross per 24 hour  Intake 680 ml  Output 9150 ml  Net -8470 ml    Filed Weights   04/04/22 0731 04/06/22 0833 04/09/22 0505  Weight: 134.3 kg 134.3 kg (!) 136.5 kg    Examination:  General exam: NAD. Respiratory system: Decreasing scattered diffuse crackles.  Fair air movement.  No wheezing noted.  Cardiovascular system: Irregularly irregular.  3+ bilateral lower extremity pitting edema.  Lower extremities less tight.  Gastrointestinal system: Abdomen is soft, nontender, nondistended, positive bowel sounds.  No rebound.  No  guarding.  Central nervous system: Alert and oriented. No focal neurological deficits. Extremities: Symmetric 5 x  5 power.  Postop dressing left lower extremity with wound VAC.  Right lower extremity with 3+ pitting edema. Skin: No rashes, lesions or ulcers Psychiatry: Judgement and insight appear normal. Mood & affect appropriate.     Data Reviewed: I have personally reviewed following labs and imaging studies  CBC: Recent Labs  Lab 04/05/22 0106 04/06/22 0112 04/07/22 0223 04/08/22 0125  WBC 11.8* 12.7* 12.4* 10.9*  NEUTROABS  --   --   --  8.2*  HGB 11.1* 11.4* 11.3* 10.9*  HCT 33.5* 33.7* 33.3* 32.5*  MCV 93.1 92.6 92.2 92.9  PLT 270 256 225 200     Basic Metabolic Panel: Recent Labs  Lab 04/05/22 0106 04/06/22 0112 04/06/22 1253 04/07/22 0223 04/08/22 0125  NA 128* 127* 129* 129* 129*  K 4.9 5.5* 4.8 5.0 4.1  CL 99 95* 96* 92* 97*  CO2 22 21* '22 24 22  '$ GLUCOSE 173* 160* 126* 154* 130*  BUN 36* 47* 48* 52* 51*  CREATININE 1.81* 1.83* 1.84* 1.84* 1.56*  CALCIUM 8.6* 9.1 8.9 9.1 9.1  MG  --   --  1.6* 2.0 1.9  PHOS  --   --   --   --  3.5     GFR: Estimated Creatinine Clearance: 52.8 mL/min (A) (by C-G formula based on SCr of 1.56 mg/dL (H)).  Liver Function Tests: Recent Labs  Lab 04/08/22 0125  ALBUMIN 2.9*     CBG: Recent Labs  Lab 04/04/22 0922 04/04/22 1021 04/06/22 0813 04/06/22 1047 04/06/22 1207  GLUCAP 114* 106* 130* 121* 119*      Recent Results (from the past 240 hour(s))  Aerobic/Anaerobic Culture w Gram Stain (surgical/deep wound)     Status: None (Preliminary result)   Collection Time: 04/04/22  9:44 AM   Specimen: PATH Soft tissue  Result Value Ref Range Status   Specimen Description LEG  Final   Special Requests  LEFT DEBRIDEMENT  Final   Gram Stain NO WBC SEEN NO ORGANISMS SEEN   Final   Culture   Final    NO GROWTH 4 DAYS NO ANAEROBES ISOLATED; CULTURE IN PROGRESS FOR 5 DAYS Performed at [facility], [place] [address] [place], [place] [address]    Report Status PENDING  Incomplete  Surgical pcr screen     Status: None    Collection Time: 04/05/22  8:11 PM   Specimen: Nasal Mucosa; Nasal Swab  Result Value Ref Range Status   MRSA, PCR NEGATIVE NEGATIVE Final   Staphylococcus aureus NEGATIVE NEGATIVE Final    Comment: (NOTE) The Xpert SA Assay (FDA approved for NASAL specimens in patients 57 years of age and older), is one component of a comprehensive surveillance program. It is not intended to diagnose infection nor to guide or monitor treatment. Performed at [facility], [place] [address], [place], [place] [address]   MRSA Next Gen by PCR, Nasal     Status: None   Collection Time: 04/06/22 12:25 PM   Specimen: Nasal Mucosa; Nasal Swab  Result Value Ref Range Status   MRSA by PCR Next Gen NOT DETECTED NOT DETECTED Final    Comment: (NOTE) The GeneXpert MRSA Assay (FDA approved for NASAL specimens only), is one component of a comprehensive MRSA colonization surveillance program. It is not intended to diagnose MRSA infection nor to guide or monitor treatment for MRSA infections. Test performance is not  FDA approved in patients less than 56 years old. Performed at [facility], [place] [address] [place], [place] [address]          Radiology Studies: US RENAL  Result Date: 04/07/2022 CLINICAL DATA:  Acute renal failure EXAM: RENAL / URINARY TRACT ULTRASOUND COMPLETE COMPARISON:  None Available. FINDINGS: Right Kidney: Renal measurements: 10.1 x 5.2 x 6.1 cm = volume: 166 mL. Cortical thinning. Echogenicity within normal limits. No mass or hydronephrosis visualized. Left Kidney: Renal measurements: 11.6 x 5.9 x 5.9 cm = volume: 210 mL. Cortical thinning. Echogenicity within normal limits. No mass or hydronephrosis visualized. Bladder: Not visualized. Other: None. IMPRESSION: 1. Bilateral renal cortical thinning, in keeping with chronic medical renal disease. 2. No hydronephrosis. 3. Bladder is nonvisualized, presumably due to decompression. Electronically Signed   By: Delanna Ahmadi M.D.   On:  04/07/2022 15:55        Scheduled Meds:  apixaban  5 mg Oral BID   vitamin C  1,000 mg Oral Daily   atorvastatin  80 mg Oral Daily   Chlorhexidine Gluconate Cloth  6 each Topical Daily   clopidogrel  75 mg Oral Q breakfast   docusate sodium  100 mg Oral BID   furosemide  60 mg Intravenous BID   metoprolol tartrate  12.5 mg Oral BID   metroNIDAZOLE  500 mg Oral Q12H   nutrition supplement (JUVEN)  1 packet Oral BID BM   pantoprazole  40 mg Oral Daily   potassium chloride  20 mEq Oral Once per day on Sun Tue Thu Sat   And   potassium chloride  40 mEq Oral Q M,W,F   zinc sulfate  220 mg Oral Daily   Continuous Infusions:  cefTRIAXone (ROCEPHIN)  IV Stopped (04/08/22 1032)   methocarbamol (ROBAXIN) IV     vancomycin 1,000 mg (04/08/22 1757)     LOS: 5 days    Time spent: 40 minutes    Irine Seal, MD [facility]   To contact the attending provider between 7A-7P or the covering provider during after hours 7P-7A, please log into the web site [email or web address] and access using universal [place] password for that web site. If you do not have the password, please call the hospital operator.  04/09/2022, 11:37 AM

## 2022-04-09 NOTE — Progress Notes (Signed)
Brief ID note  9/13 I&D Cx Culture RARE SERRATIA MARCESCENS  CULTURE REINCUBATED FOR BETTER GROWTH  NO ANAEROBES ISOLATED     Plan: -D/C ceftriaxone, metronidazole  and vanc -Start cefepime and doxycyline -Follow sens of serratia marcescens

## 2022-04-09 NOTE — Progress Notes (Signed)
Patient ID: Ryan Garner, male   DOB: 12/03/1945, 76 y.o.   MRN: [id number] Patient is a 76 year old gentleman who presents in follow-up status post debridement abscess left proximal tibia.  Cultures are still negative.  There is clear serosanguineous fluid from the wound VAC.  Patient had a Foley placed secondary to urinary retention we will discontinue the Foley today patient states he has not had a bowel movement will order additional laxatives.  Discussed with the patient that we can discharge to home pending his ability to ambulate independently with therapy.  Anticipate discharge on IV vancomycin.

## 2022-04-09 NOTE — Progress Notes (Signed)
Occupational Therapy Treatment Patient Details Name: Ryan Garner MRN: [id number] DOB: April 01, 1946 Today's Date: 04/09/2022   History of present illness 76 yo male admitted with chronic abscess of Lt tibia due to an injury about a year ago when his leg was pinned between a bobcat blade and a truck. Pt s/p LLE I&D 9/13 and 9/15 with VAC. 9/15 during intubation pt with bradycardia and hypotension.  PMH: afib, CHF,  DM, HLD, HTN, STEMI, rotator cuff repair   OT comments  Pt progressing towards goals, pt able to complete bed mobility mod A, min-mod A +2 for transfers to/from [facility] with use of stedy (to [facility]) and RW (back to bed). Pt with soft BP initially, asymptomatic (see below). Pt needing max A for pericare in standing. Pt presenting with impairments listed below, will follow acutely. Recommend SNF at d/c pending progression.  BP supine 94/50 (64) BP seated EOB 110/71 (84)  BP after BSC transfer 106/76 (88) BP after 3 min on BSC 110/76 (88)   Recommendations for follow up therapy are one component of a multi-disciplinary discharge planning process, led by the attending physician.  Recommendations may be updated based on patient status, additional functional criteria and insurance authorization.    Follow Up Recommendations  Skilled nursing-short term rehab (<3 hours/day) (pending progression)    Assistance Recommended at Discharge Intermittent Supervision/Assistance  Patient can return home with the following  A little help with bathing/dressing/bathroom;A little help with walking and/or transfers   Equipment Recommendations  None recommended by OT;Other (comment) (defer)    Recommendations for Other Services      Precautions / Restrictions Precautions Precautions: Fall Precaution Comments: wound vac LLE Restrictions Weight Bearing Restrictions: Yes LLE Weight Bearing: Weight bearing as tolerated       Mobility Bed Mobility Overal bed mobility: Needs Assistance Bed Mobility:  Supine to Sit, Sit to Supine     Supine to sit: Mod assist Sit to supine: Mod assist   General bed mobility comments: assist for trunk and BLE mgmt    Transfers Overall transfer level: Needs assistance Equipment used: Rolling walker (2 wheels) Transfers: Sit to/from Stand, Bed to chair/wheelchair/BSC Sit to Stand: Mod assist, Min assist, +2 safety/equipment     Step pivot transfers: Min assist, +2 physical assistance     General transfer comment: Stedy used with RN assist in room to get pt to [place], able to use RW to transfer back to bed with mod A     Balance Overall balance assessment: Needs assistance Sitting-balance support: No upper extremity supported, Feet supported Sitting balance-Leahy Scale: Fair Sitting balance - Comments: sitting EOB without support   Standing balance support: Reliant on assistive device for balance, Bilateral upper extremity supported Standing balance-Leahy Scale: Poor Standing balance comment: RW in standing                           ADL either performed or assessed with clinical judgement   ADL Overall ADL's : Needs assistance/impaired                         Toilet Transfer: Moderate assistance;Minimal assistance;+2 for safety/equipment Toilet Transfer Details (indicate cue type and reason): min with stedy, mod with RW Toileting- Clothing Manipulation and Hygiene: Maximal assistance;Sit to/from stand Toileting - Clothing Manipulation Details (indicate cue type and reason): pericare     Functional mobility during ADLs: Moderate assistance;Minimal assistance;Rolling walker (2 wheels)  Extremity/Trunk Assessment Upper Extremity Assessment Upper Extremity Assessment: Overall WFL for tasks assessed   Lower Extremity Assessment Lower Extremity Assessment: Defer to PT evaluation        Vision   Vision Assessment?: No apparent visual deficits   Perception Perception Perception: Not tested   Praxis  Praxis Praxis: Not tested    Cognition Arousal/Alertness: Awake/alert Behavior During Therapy: Flat affect Overall Cognitive Status: Within Functional Limits for tasks assessed                                 General Comments: Pt A and O x 4. Pt cooperative.        Exercises      Shoulder Instructions       General Comments BP soft, but WNL, pt asymptomatic    Pertinent Vitals/ Pain       Pain Assessment Pain Assessment: Faces Pain Score: 2  Faces Pain Scale: Hurts a little bit Pain Location: chest Pain Descriptors / Indicators: Aching, Guarding Pain Intervention(s): Limited activity within patient's tolerance, Monitored during session, Repositioned  Home Living                                          Prior Functioning/Environment              Frequency  Min 2X/week        Progress Toward Goals  OT Goals(current goals can now be found in the care plan section)  Progress towards OT goals: Progressing toward goals  Acute Rehab OT Goals Patient Stated Goal: to go home OT Goal Formulation: With patient Time For Goal Achievement: 04/19/22 Potential to Achieve Goals: Good ADL Goals Pt Will Perform Lower Body Bathing: with modified independence;sit to/from stand Pt Will Perform Lower Body Dressing: with modified independence;sit to/from stand Pt Will Transfer to Toilet: with modified independence;ambulating Pt Will Perform Tub/Shower Transfer: with modified independence;ambulating Additional ADL Goal #1: Pt will tolerate 15 mins of standing tasks to be able to complete meal preperations at home level  Plan Discharge plan remains appropriate;Frequency remains appropriate    Co-evaluation                 AM-PAC OT "6 Clicks" Daily Activity     Outcome Measure   Help from another person eating meals?: None Help from another person taking care of personal grooming?: None Help from another person toileting, which  includes using toliet, bedpan, or urinal?: A Little Help from another person bathing (including washing, rinsing, drying)?: A Little Help from another person to put on and taking off regular upper body clothing?: A Little Help from another person to put on and taking off regular lower body clothing?: A Little 6 Click Score: 20    End of Session Equipment Utilized During Treatment: Rolling walker (2 wheels)  OT Visit Diagnosis: Unsteadiness on feet (R26.81);Other abnormalities of gait and mobility (R26.89);Muscle weakness (generalized) (M62.81);Pain   Activity Tolerance Patient tolerated treatment well   Patient Left in bed;with call bell/phone within reach;with bed alarm set (in bed as RN needing to give pt an enema)   Nurse Communication Mobility status        Time: 6387-5643 OT Time Calculation (min): 33 min  Charges: OT General Charges $OT Visit: 1 Visit OT Treatments $Self Care/Home Management : 8-22 mins $Therapeutic Activity:  8-22 mins  Lynnda Child, OTD, OTR/L Acute Rehab [phone]   Kaylyn Lim 04/09/2022, 9:27 AM

## 2022-04-09 NOTE — Progress Notes (Signed)
Rounding Note    Patient Name: Ryan Garner Date of Encounter: 04/09/2022  [place] Cardiologist: Dorris Carnes, MD   Subjective   Sore chest (positional, tender) , likely due to CPR. Total urine output 8.5 L yesterday, net diuresis 12.3 L Atrial fibrillation with controlled ventricular response. Labs pending, but showed improvement with first 24 hours of diuresis. Inpatient Medications    Scheduled Meds:  apixaban  5 mg Oral BID   vitamin C  1,000 mg Oral Daily   atorvastatin  80 mg Oral Daily   Chlorhexidine Gluconate Cloth  6 each Topical Daily   clopidogrel  75 mg Oral Q breakfast   docusate sodium  100 mg Oral BID   furosemide  60 mg Intravenous BID   metoprolol tartrate  12.5 mg Oral BID   metroNIDAZOLE  500 mg Oral Q12H   nutrition supplement (JUVEN)  1 packet Oral BID BM   pantoprazole  40 mg Oral Daily   potassium chloride  20 mEq Oral Once per day on Sun Tue Thu Sat   And   potassium chloride  40 mEq Oral Q M,W,F   sodium phosphate  1 enema Rectal Once   zinc sulfate  220 mg Oral Daily   Continuous Infusions:  cefTRIAXone (ROCEPHIN)  IV Stopped (04/08/22 1032)   methocarbamol (ROBAXIN) IV     vancomycin 1,000 mg (04/08/22 1757)   PRN Meds: acetaminophen, alum & mag hydroxide-simeth, bisacodyl, bisacodyl, guaiFENesin-dextromethorphan, hydrALAZINE, HYDROmorphone (DILAUDID) injection, labetalol, magnesium citrate, methocarbamol **OR** methocarbamol (ROBAXIN) IV, metoCLOPramide **OR** metoCLOPramide (REGLAN) injection, metoprolol tartrate, ondansetron **OR** ondansetron (ZOFRAN) IV, oxyCODONE, oxyCODONE, phenol, polyethylene glycol   Vital Signs    Vitals:   04/08/22 2341 04/09/22 0345 04/09/22 0505 04/09/22 0844  BP: 124/89 (!) 121/57  (!) 94/50  Pulse: 89 78  79  Resp: '17 11  19  '$ Temp: 97.7 F (36.5 C) 97.7 F (36.5 C)  98.5 F (36.9 C)  TempSrc: Oral Oral  Axillary  SpO2: 99% 95%  97%  Weight:   (!) 136.5 kg   Height:         Intake/Output Summary (Last 24 hours) at 04/09/2022 0951 Last data filed at 04/09/2022 0455 Gross per 24 hour  Intake 680 ml  Output 8450 ml  Net -7770 ml      04/09/2022    5:05 AM 04/06/2022    8:33 AM 04/04/2022    7:31 AM  Last 3 Weights  Weight (lbs) 301 lb 296 lb 296 lb  Weight (kg) 136.533 kg 134.265 kg 134.265 kg      Telemetry    Atrial fibrillation with controlled ventricular response- Personally Reviewed  ECG    Atrial fibrillation with controlled ventricular response, right axis deviation not quite meeting criteria for left posterior fascicular block, right ventricular hypertrophy with secondary repolarization abnormalities in the anterior precordial leads- Personally Reviewed  Physical Exam  Morbidly obese GEN: No acute distress.   Neck: Hard to see JVD Cardiac: Irregular, 3/6 aortic ejection murmur at the right upper sternal border and 3/6 holosystolic murmur at the apex and at the left lower sternal border, no stock murmurs, rubs, or gallops.  Respiratory: Clear to auscultation bilaterally. GI: Soft, nontender, non-distended  MS: No edema; No deformity. Neuro:  Nonfocal  Psych: Normal affect   Labs    High Sensitivity Troponin:   Recent Labs  Lab 04/06/22 1253 04/06/22 1451  TROPONINIHS 21* 27*     Chemistry Recent Labs  Lab 04/06/22 1253 04/07/22  3244 04/08/22 0125  NA 129* 129* 129*  K 4.8 5.0 4.1  CL 96* 92* 97*  CO2 '22 24 22  '$ GLUCOSE 126* 154* 130*  BUN 48* 52* 51*  CREATININE 1.84* 1.84* 1.56*  CALCIUM 8.9 9.1 9.1  MG 1.6* 2.0 1.9  ALBUMIN  --   --  2.9*  GFRNONAA 37* 37* 45*  ANIONGAP '11 13 10    '$ Lipids No results for input(s): "CHOL", "TRIG", "HDL", "LABVLDL", "LDLCALC", "CHOLHDL" in the last 168 hours.  Hematology Recent Labs  Lab 04/06/22 0112 04/07/22 0223 04/08/22 0125  WBC 12.7* 12.4* 10.9*  RBC 3.64* 3.61* 3.50*  HGB 11.4* 11.3* 10.9*  HCT 33.7* 33.3* 32.5*  MCV 92.6 92.2 92.9  MCH 31.3 31.3 31.1  MCHC 33.8 33.9  33.5  RDW 14.1 14.2 14.4  PLT 256 225 200   Thyroid No results for input(s): "TSH", "FREET4" in the last 168 hours.  BNP Recent Labs  Lab 04/06/22 1253  BNP 682.5*    DDimer No results for input(s): "DDIMER" in the last 168 hours.   Radiology    US RENAL  Result Date: 04/07/2022 CLINICAL DATA:  Acute renal failure EXAM: RENAL / URINARY TRACT ULTRASOUND COMPLETE COMPARISON:  None Available. FINDINGS: Right Kidney: Renal measurements: 10.1 x 5.2 x 6.1 cm = volume: 166 mL. Cortical thinning. Echogenicity within normal limits. No mass or hydronephrosis visualized. Left Kidney: Renal measurements: 11.6 x 5.9 x 5.9 cm = volume: 210 mL. Cortical thinning. Echogenicity within normal limits. No mass or hydronephrosis visualized. Bladder: Not visualized. Other: None. IMPRESSION: 1. Bilateral renal cortical thinning, in keeping with chronic medical renal disease. 2. No hydronephrosis. 3. Bladder is nonvisualized, presumably due to decompression. Electronically Signed   By: Delanna Ahmadi M.D.   On: 04/07/2022 15:55   ECHOCARDIOGRAM COMPLETE  Result Date: 04/07/2022    ECHOCARDIOGRAM REPORT   Patient Name:   Ryan Garner Date of Exam: 04/07/2022 Medical Rec #:  [id number]      Height:       68.0 in Accession #:    [id number]     Weight:       296.0 lb Date of Birth:  Mar 26, 1946       BSA:          2.414 m Patient Age:    76 years       BP:           102/76 mmHg Patient Gender: M              HR:           67 bpm. Exam Location:  Inpatient Procedure: 2D Echo, Cardiac Doppler and Color Doppler Indications:    Brief cardiac arrest following wound debridement  History:        Patient has prior history of Echocardiogram examinations, most                 recent 06/02/2021. CHF, CAD and Previous Myocardial Infarction,                 Arrythmias:Atrial Fibrillation; Risk Factors:Hypertension,                 Diabetes and Dyslipidemia.  Sonographer:    Merrie Roof RDCS Referring Phys: Berniece Salines  Sonographer  Comments: Patient is obese. Image acquisition challenging due to patient body habitus and patient supine. IMPRESSIONS  1. Left ventricular ejection fraction, by estimation, is 50 to 55%. The left ventricle has mildly decreased function.  The left ventricle has no regional wall motion abnormalities. There is mild concentric left ventricular hypertrophy. Left ventricular diastolic function could not be evaluated. There is the interventricular septum is flattened in systole and diastole, consistent with right ventricular pressure and volume overload.  2. Right ventricular systolic function is moderately reduced. The right ventricular size is moderately enlarged. There is severely elevated pulmonary artery systolic pressure. The estimated right ventricular systolic pressure is 69.6 mmHg.  3. Left atrial size was severely dilated.  4. Right atrial size was severely dilated.  5. The mitral valve is grossly normal. Severe mitral valve regurgitation. Moderate mitral annular calcification.  6. The aortic valve is tricuspid. There is severe calcifcation of the aortic valve. There is severe thickening of the aortic valve. Aortic valve regurgitation is not visualized. Moderate aortic valve stenosis. Aortic valve mean gradient measures 17.0 mmHg. Aortic valve Vmax measures 2.71 m/s. Aortic valve acceleration time measures 120 msec.  7. Aortic dilatation noted. There is mild dilatation of the ascending aorta, measuring 41 mm. There is mild dilatation of the aortic root, measuring 39 mm.  8. The inferior vena cava is dilated in size with <50% respiratory variability, suggesting right atrial pressure of 15 mmHg. Comparison(s): Prior images reviewed side by side. The left ventricular function is worsened. Mitral insufficiency is worse. FINDINGS  Left Ventricle: Left ventricular ejection fraction, by estimation, is 50 to 55%. The left ventricle has mildly decreased function. The left ventricle has no regional wall motion  abnormalities. The left ventricular internal cavity size was normal in size. There is mild concentric left ventricular hypertrophy. The interventricular septum is flattened in systole and diastole, consistent with right ventricular pressure and volume overload. Left ventricular diastolic function could not be evaluated due to atrial fibrillation. Left ventricular diastolic function could not be evaluated. Right Ventricle: The right ventricular size is moderately enlarged. Right vetricular wall thickness was not well visualized. Right ventricular systolic function is moderately reduced. There is severely elevated pulmonary artery systolic pressure. The tricuspid regurgitant velocity is 4.19 m/s, and with an assumed right atrial pressure of 15 mmHg, the estimated right ventricular systolic pressure is 78.9 mmHg. Left Atrium: Left atrial size was severely dilated. Right Atrium: Right atrial size was severely dilated. Pericardium: There is no evidence of pericardial effusion. Mitral Valve: The mitral valve is grossly normal. Moderate mitral annular calcification. Severe mitral valve regurgitation. Tricuspid Valve: The tricuspid valve is normal in structure. Tricuspid valve regurgitation is mild. Aortic Valve: Gradients underestimate the aortic stenosis severity due to low stroke volume. The aortic valve is tricuspid. There is severe calcifcation of the aortic valve. There is severe thickening of the aortic valve. Aortic valve regurgitation is not visualized. Moderate aortic stenosis is present. Aortic valve mean gradient measures 17.0 mmHg. Aortic valve peak gradient measures 29.4 mmHg. Aortic valve area, by VTI measures 1.23 cm. Pulmonic Valve: The pulmonic valve was grossly normal. Pulmonic valve regurgitation is not visualized. Aorta: Aortic dilatation noted. There is mild dilatation of the ascending aorta, measuring 41 mm. There is mild dilatation of the aortic root, measuring 39 mm. Venous: A pattern of systolic  flow reversal, suggestive of severe mitral regurgitation is recorded from the left upper pulmonary vein. The inferior vena cava is dilated in size with less than 50% respiratory variability, suggesting right atrial pressure of 15 mmHg. IAS/Shunts: No atrial level shunt detected by color flow Doppler.  LEFT VENTRICLE PLAX 2D LVIDd:         5.40 cm  Diastology LVIDs:         4.00 cm   LV e' lateral: 12.00 cm/s LV PW:         1.20 cm LV IVS:        1.20 cm LVOT diam:     2.40 cm LV SV:         74 LV SV Index:   31 LVOT Area:     4.52 cm  RIGHT VENTRICLE            IVC RV Basal diam:  4.70 cm    IVC diam: 2.50 cm RV Mid diam:    3.30 cm RV S prime:     6.09 cm/s TAPSE (M-mode): 1.2 cm LEFT ATRIUM              Index        RIGHT ATRIUM           Index LA diam:        5.20 cm  2.15 cm/m   RA Area:     28.40 cm LA Vol (A2C):   115.0 ml 47.63 ml/m  RA Volume:   95.20 ml  39.43 ml/m LA Vol (A4C):   107.0 ml 44.32 ml/m LA Biplane Vol: 114.0 ml 47.22 ml/m  AORTIC VALVE AV Area (Vmax):    1.38 cm AV Area (Vmean):   1.25 cm AV Area (VTI):     1.23 cm AV Vmax:           271.00 cm/s AV Vmean:          197.000 cm/s AV VTI:            0.602 m AV Peak Grad:      29.4 mmHg AV Mean Grad:      17.0 mmHg LVOT Vmax:         82.51 cm/s LVOT Vmean:        54.523 cm/s LVOT VTI:          0.163 m LVOT/AV VTI ratio: 0.27  AORTA Ao Root diam: 3.90 cm Ao Asc diam:  4.00 cm MR Peak grad:    66.9 mmHg    TRICUSPID VALVE MR Mean grad:    47.0 mmHg    TR Peak grad:   70.2 mmHg MR Vmax:         409.00 cm/s  TR Vmax:        419.00 cm/s MR Vmean:        330.0 cm/s MR PISA:         8.31 cm     SHUNTS MR PISA Eff ROA: 68 mm       Systemic VTI:  0.16 m MR PISA Radius:  1.15 cm      Systemic Diam: 2.40 cm Dani Gobble Layonna Dobie MD Electronically signed by Sanda Klein MD Signature Date/Time: 04/07/2022/2:16:06 PM    Final     Cardiac Studies   Echocardiogram 04/07/2022    1. Left ventricular ejection fraction, by estimation, is 50 to 55%. The   left ventricle has mildly decreased function. The left ventricle has no  regional wall motion abnormalities. There is mild concentric left  ventricular hypertrophy. Left ventricular  diastolic function could not be evaluated. There is the interventricular  septum is flattened in systole and diastole, consistent with right  ventricular pressure and volume overload.   2. Right ventricular systolic function is moderately reduced. The right  ventricular size is moderately enlarged. There is severely elevated  pulmonary artery systolic  pressure. The estimated right ventricular  systolic pressure is 88.3 mmHg.   3. Left atrial size was severely dilated.   4. Right atrial size was severely dilated.   5. The mitral valve is grossly normal. Severe mitral valve regurgitation.  Moderate mitral annular calcification.   6. The aortic valve is tricuspid. There is severe calcifcation of the  aortic valve. There is severe thickening of the aortic valve. Aortic valve  regurgitation is not visualized. Moderate aortic valve stenosis. Aortic  valve mean gradient measures 17.0  mmHg. Aortic valve Vmax measures 2.71 m/s. Aortic valve acceleration time  measures 120 msec.   7. Aortic dilatation noted. There is mild dilatation of the ascending  aorta, measuring 41 mm. There is mild dilatation of the aortic root,  measuring 39 mm.   8. The inferior vena cava is dilated in size with <50% respiratory  variability, suggesting right atrial pressure of 15 mmHg.   Comparison(s): Prior images reviewed side by side. The left ventricular  function is worsened. Mitral insufficiency is worse.    Cardiac catheterization 04/30/2020  Ramus lesion is 75% stenosed. Mid Cx lesion is 99% stenosed. Post intervention, there is a 0% residual stenosis. A drug-eluting stent was successfully placed using a STENT RESOLUTE ONYX 2.5X34. The left ventricular systolic function is normal. LV end diastolic pressure is mildly  elevated. The left ventricular ejection fraction is 55-65% by visual estimate.   1. 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 Good LV systolic function 3. Elevated LVEDP 4. Successful PCI of the mid to distal LCx with DES x 1.   Plan: ASA 81 mg daily for one month. Plavix 75 mg daily for one year. May resume Eliquis tomorrow. IV diuresis. Will need to stop Flecainide. Consider alternative AAD therapy.  Diagnostic Dominance: Right  Intervention   Implants     Permanent Stent  Stent Resolute Onyx 2.5x34       Patient Profile     76 y.o. male with permanent atrial fibrillation, severe mitral insufficiency, moderate aortic stenosis, coronary artery disease (left circumflex drug-eluting stent October 2021) heart failure with preserved ejection fraction, severe pulmonary hypertension, CKD stage IIIb, mild dilation of the ascending aorta, had transient bradycardia and hypotension during induction of anesthesia for left leg abscess debridement.  Assessment & Plan    CHF: Continues with excellent diuresis.  Dry weight uncertain but definitely less than 296 pounds based on records.  Minimally decreased LVEF.  The driver appears to be valvular heart disease.  He has both moderate aortic stenosis and what appears to be severe mitral regurgitation, new finding compared with previous echocardiograms.   MR: May need transesophageal echocardiography to evaluate the mitral valve in more detail.  Transthoracic echo image quality is not great and it is difficult to establish the etiology. Differential diagnosis includes endocarditis (skin source? Negative blood cultures 03/01/22), ischemic tethering (known LCX disease w stent 2021), even transient myocardial stunning following his brief arrest. Reassess limited TTE later this week before deciding on TEE this week or after healing of wound. AS: Appears to be squarely in moderate range, probably not severe enough to cause the  MR. PAH: Severe and associated with significant RV distention and failure.  In large part may be due to left heart disease, but probably also due to obstructive sleep apnea.  At some point will need right and left heart catheterization, once we believe him to be close to euvolemia. Afib: Permanent arrhythmia, well rate controlled, appropriately  anticoagulated. CKD3: Monitor with diuresis.  Labs pending today. CAD: History of stents roughly 2 years ago.  Left circumflex coronary artery ischemia may explain mitral insufficiency, but he has not had angina or ECG changes to suggest active ischemia. May need R and L heart cath due to MR. Cellulitis/leg abscess: I think it is best to delay further cardiac work-up and intervention until this is healed. On Vancomycin. Hyponatremia: persists, watch daily with aggressive diuresis   For questions or updates, please contact [place] Please consult [email or web address] for contact info under        Signed, Sanda Klein, MD  04/09/2022, 9:51 AM  \

## 2022-04-10 DIAGNOSIS — I350 Nonrheumatic aortic (valve) stenosis: Secondary | ICD-10-CM

## 2022-04-10 DIAGNOSIS — I5033 Acute on chronic diastolic (congestive) heart failure: Secondary | ICD-10-CM | POA: Diagnosis not present

## 2022-04-10 DIAGNOSIS — I2510 Atherosclerotic heart disease of native coronary artery without angina pectoris: Secondary | ICD-10-CM

## 2022-04-10 DIAGNOSIS — I2721 Secondary pulmonary arterial hypertension: Secondary | ICD-10-CM

## 2022-04-10 DIAGNOSIS — I340 Nonrheumatic mitral (valve) insufficiency: Secondary | ICD-10-CM | POA: Diagnosis not present

## 2022-04-10 DIAGNOSIS — N179 Acute kidney failure, unspecified: Secondary | ICD-10-CM | POA: Diagnosis not present

## 2022-04-10 DIAGNOSIS — L02416 Cutaneous abscess of left lower limb: Secondary | ICD-10-CM | POA: Diagnosis not present

## 2022-04-10 DIAGNOSIS — I4821 Permanent atrial fibrillation: Secondary | ICD-10-CM | POA: Diagnosis not present

## 2022-04-10 LAB — CBC WITH DIFFERENTIAL/PLATELET
Abs Immature Granulocytes: 0.11 10*3/uL — ABNORMAL HIGH (ref 0.00–0.07)
Basophils Absolute: 0 10*3/uL (ref 0.0–0.1)
Basophils Relative: 0 %
Eosinophils Absolute: 0.2 10*3/uL (ref 0.0–0.5)
Eosinophils Relative: 2 %
HCT: 34.8 % — ABNORMAL LOW (ref 39.0–52.0)
Hemoglobin: 11.7 g/dL — ABNORMAL LOW (ref 13.0–17.0)
Immature Granulocytes: 1 %
Lymphocytes Relative: 6 %
Lymphs Abs: 0.9 10*3/uL (ref 0.7–4.0)
MCH: 31 pg (ref 26.0–34.0)
MCHC: 33.6 g/dL (ref 30.0–36.0)
MCV: 92.3 fL (ref 80.0–100.0)
Monocytes Absolute: 1.3 10*3/uL — ABNORMAL HIGH (ref 0.1–1.0)
Monocytes Relative: 9 %
Neutro Abs: 12.5 10*3/uL — ABNORMAL HIGH (ref 1.7–7.7)
Neutrophils Relative %: 82 %
Platelets: 252 10*3/uL (ref 150–400)
RBC: 3.77 MIL/uL — ABNORMAL LOW (ref 4.22–5.81)
RDW: 15.1 % (ref 11.5–15.5)
WBC: 15.1 10*3/uL — ABNORMAL HIGH (ref 4.0–10.5)
nRBC: 0 % (ref 0.0–0.2)

## 2022-04-10 LAB — RENAL FUNCTION PANEL
Albumin: 2.6 g/dL — ABNORMAL LOW (ref 3.5–5.0)
Anion gap: 11 (ref 5–15)
BUN: 34 mg/dL — ABNORMAL HIGH (ref 8–23)
CO2: 28 mmol/L (ref 22–32)
Calcium: 9 mg/dL (ref 8.9–10.3)
Chloride: 96 mmol/L — ABNORMAL LOW (ref 98–111)
Creatinine, Ser: 1.15 mg/dL (ref 0.61–1.24)
GFR, Estimated: >60 mL/min (ref >60)
Glucose, Bld: 139 mg/dL — ABNORMAL HIGH (ref 70–99)
Phosphorus: 2.9 mg/dL (ref 2.5–4.6)
Potassium: 3.7 mmol/L (ref 3.5–5.1)
Sodium: 135 mmol/L (ref 135–145)

## 2022-04-10 LAB — MAGNESIUM: Magnesium: 1.7 mg/dL (ref 1.7–2.4)

## 2022-04-10 MED ORDER — SODIUM CHLORIDE 0.9 % IV SOLN
2.0000 g | Freq: Two times a day (BID) | INTRAVENOUS | Status: DC
  Administered 2022-04-10 – 2022-04-11 (×2): 2 g via INTRAVENOUS
  Filled 2022-04-10 (×2): qty 12.5

## 2022-04-10 MED ORDER — LEVALBUTEROL HCL 0.63 MG/3ML IN NEBU
0.6300 mg | INHALATION_SOLUTION | Freq: Four times a day (QID) | RESPIRATORY_TRACT | Status: DC | PRN
  Administered 2022-04-10: 0.63 mg via RESPIRATORY_TRACT
  Filled 2022-04-10: qty 3

## 2022-04-10 MED ORDER — FUROSEMIDE 10 MG/ML IJ SOLN
60.0000 mg | Freq: Once | INTRAMUSCULAR | Status: AC
  Administered 2022-04-10: 60 mg via INTRAVENOUS
  Filled 2022-04-10: qty 6

## 2022-04-10 MED ORDER — ALBUMIN HUMAN 25 % IV SOLN
25.0000 g | Freq: Two times a day (BID) | INTRAVENOUS | Status: AC
  Administered 2022-04-10 (×2): 25 g via INTRAVENOUS
  Filled 2022-04-10 (×2): qty 100

## 2022-04-10 MED ORDER — MAGNESIUM SULFATE 4 GM/100ML IV SOLN
4.0000 g | Freq: Once | INTRAVENOUS | Status: AC
  Administered 2022-04-10: 4 g via INTRAVENOUS
  Filled 2022-04-10: qty 100

## 2022-04-10 NOTE — Progress Notes (Signed)
Mobility Specialist: Progress Note   04/10/22 1752  Mobility  Activity Transferred from bed to chair  Level of Assistance Moderate assist, patient does 50-74%  Assistive Device Front wheel walker  LLE Weight Bearing WBAT  Distance Ambulated (ft) 2 ft  Activity Response Tolerated well  $Mobility charge 1 Mobility   Received pt in bed having no complaints and agreeable to mobility. ModA with bed mobility and as well as to stand. Contact guard during ambulation. Pt was asymptomatic throughout ambulation and returned to room w/o fault. Left in chair w/ call bell in reach and all needs met.  [facility] L[place]ia Nolden Mobility Specialist Mobility Specialist [address]: [phone]

## 2022-04-10 NOTE — Progress Notes (Signed)
Patient ID: Ryan Garner, male   DOB: 03-24-1946, 76 y.o.   MRN: [id number] Patient is status post debridement of left leg abscess.  Cultures positive for Serratia.  Foley catheter still in place for patient's diuresis.  Patient states he did have a bowel movement after the enema.  There is 375 cc of clear serosanguineous fluid in the wound VAC canister.  Patient will need clearance by physical therapy for discharge to home.  Patient does not want to pursue skilled nursing.  Encourage patient to be out of bed to a chair.

## 2022-04-10 NOTE — Progress Notes (Signed)
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 Pt s/p LLE I&D 9/13 and 9/15 with VAC. 9/15 during intubation pt with bradycardia and hypotension.  PMH: afib, CHF,  DM, HLD, HTN, STEMI, rotator cuff repair    PT Comments    Pt received OOB in recliner on arrival and agreeable to session with good progress towards acute goals, however pt continues to be limited by fatigue and weakness. Session focused on continued transfer and gait training for increased activity tolerance and increased functional independence. Pt requiring up to mod assist to come to standing in EVA walker (pt reporting use of upright rollator at home/baseline) as pt fatigue increased throughout session. Pt eager to progress mobility this session, requiring min assist with close chair follow for safety for gait training in EVA walker with distance limited to fatigue and noted DOE. Pt needing x2 standing rest during gait bout and assist for navigation as pt with with right/left sway throughout, however no overt LOB. Current plan remains appropriate to address deficits and maximize functional independence and decrease caregiver burden. Pt continues to benefit from skilled PT services to progress toward functional mobility goals.    Recommendations for follow up therapy are one component of a multi-disciplinary discharge planning process, led by the attending physician.  Recommendations may be updated based on patient status, additional functional criteria and insurance authorization.  Follow Up Recommendations  Skilled nursing-short term rehab (<3 hours/day) Can patient physically be transported by private vehicle: No   Assistance Recommended at Discharge Frequent or constant  Supervision/Assistance  Patient can return home with the following A lot of help with walking and/or transfers;A lot of help with bathing/dressing/bathroom;Assistance with cooking/housework;Assist for transportation;Help with stairs or ramp for entrance   Equipment Recommendations  Rolling walker (2 wheels);BSC/3in1    Recommendations for Other Services       Precautions / Restrictions Precautions Precautions: Fall Precaution Comments: wound vac LLE Restrictions Weight Bearing Restrictions: Yes LLE Weight Bearing: Weight bearing as tolerated     Mobility  Bed Mobility Overal bed mobility: Needs Assistance             General bed mobility comments: pt OOB in recliner pre and post session    Transfers Overall transfer level: Needs assistance Equipment used: Bilateral platform walker Harmon Pier walker) Transfers: Sit to/from Stand Sit to Stand: Mod assist, Min assist           General transfer comment: min assist fro first attempt mod for subsequent stands with increased fatigue x2 from recliner, x1 from BSc    Ambulation/Gait Ambulation/Gait assistance: Min assist, +2 safety/equipment (for chair follow) Gait Distance (Feet): 60 Feet Assistive device: Ethelene Hal Gait Pattern/deviations: Step-through pattern, Decreased stride length, Trunk flexed, Antalgic Gait velocity: decreased   Pre-gait activities: standing marching x2 mins General Gait Details: slow unsteady gait with EVA walker (pt reporting use of upright rollator at home) with close chair follow for safety throughout, x2 standing rest, no overt LOB however pt with noted sway left/right with lumbering steps and low foot clearance, light min assist itnermittently for EVA walker navigation   Stairs             Wheelchair Mobility    Modified Rankin (Stroke Patients Only)  Balance Overall balance assessment: Needs assistance Sitting-balance support: No upper extremity supported, Feet  supported Sitting balance-Leahy Scale: Fair Sitting balance - Comments: sitting EOB without support   Standing balance support: Reliant on assistive device for balance, Bilateral upper extremity supported Standing balance-Leahy Scale: Poor Standing balance comment: EVA walker in standing                            Cognition Arousal/Alertness: Awake/alert Behavior During Therapy: Flat affect Overall Cognitive Status: Within Functional Limits for tasks assessed                                 General Comments: Pt A and O x 4. Pt cooperative.        Exercises      General Comments General comments (skin integrity, edema, etc.): VSS on RA      Pertinent Vitals/Pain Pain Assessment Pain Assessment: Faces Faces Pain Scale: Hurts a little bit Pain Location: general Pain Descriptors / Indicators: Sore Pain Intervention(s): Limited activity within patient's tolerance, Monitored during session    Home Living                          Prior Function            PT Goals (current goals can now be found in the care plan section) Acute Rehab PT Goals Patient Stated Goal: get up; have less pain PT Goal Formulation: With patient Time For Goal Achievement: 04/19/22    Frequency    Min 3X/week      PT Plan Current plan remains appropriate    Co-evaluation              AM-PAC PT "6 Clicks" Mobility   Outcome Measure  Help needed turning from your back to your side while in a flat bed without using bedrails?: A Lot Help needed moving from lying on your back to sitting on the side of a flat bed without using bedrails?: A Lot Help needed moving to and from a bed to a chair (including a wheelchair)?: A Lot Help needed standing up from a chair using your arms (e.g., wheelchair or bedside chair)?: A Lot Help needed to walk in hospital room?: A Lot Help needed climbing 3-5 steps with a railing? : Total 6 Click Score: 11    End of  Session   Activity Tolerance: Patient tolerated treatment well Patient left: in chair;with call bell/phone within reach;with nursing/sitter in room Nurse Communication: Mobility status PT Visit Diagnosis: Other abnormalities of gait and mobility (R26.89);Pain;Muscle weakness (generalized) (M62.81) Pain - Right/Left: Left Pain - part of body: Leg     Time: 3295-1884 PT Time Calculation (min) (ACUTE ONLY): 40 min  Charges:  $Gait Training: 23-37 mins $Therapeutic Activity: 8-22 mins                    Jaevon Paras R. PTA Acute Rehabilitation Services Office: [place] 04/10/2022, 12:33 PM

## 2022-04-10 NOTE — Progress Notes (Signed)
Rounding Note    Patient Name: Ryan Garner Date of Encounter: 04/10/2022  [place] Cardiologist: Dorris Carnes, MD   Subjective   Feels better.  Breathing feels less encumbered. Continues with prodigious diuresis, -3.5 L in last 24 hours, -16 L total.  Despite this he continues to have severe lower extremity edema.  His renal function is improving with diuresis.  Inpatient Medications    Scheduled Meds:  apixaban  5 mg Oral BID   vitamin C  1,000 mg Oral Daily   atorvastatin  80 mg Oral Daily   Chlorhexidine Gluconate Cloth  6 each Topical Daily   clopidogrel  75 mg Oral Q breakfast   docusate sodium  100 mg Oral BID   doxycycline  100 mg Oral Q12H   furosemide  60 mg Intravenous BID   metoprolol tartrate  12.5 mg Oral BID   nutrition supplement (JUVEN)  1 packet Oral BID BM   pantoprazole  40 mg Oral Daily   potassium chloride  20 mEq Oral Once per day on Sun Tue Thu Sat   And   potassium chloride  40 mEq Oral Q M,W,F   zinc sulfate  220 mg Oral Daily   Continuous Infusions:  ceFEPime (MAXIPIME) IV 2 g (04/10/22 0543)   magnesium sulfate bolus IVPB 4 g (04/10/22 1013)   methocarbamol (ROBAXIN) IV     PRN Meds: acetaminophen, alum & mag hydroxide-simeth, bisacodyl, bisacodyl, guaiFENesin-dextromethorphan, hydrALAZINE, HYDROmorphone (DILAUDID) injection, labetalol, magnesium citrate, methocarbamol **OR** methocarbamol (ROBAXIN) IV, metoCLOPramide **OR** metoCLOPramide (REGLAN) injection, metoprolol tartrate, ondansetron **OR** ondansetron (ZOFRAN) IV, oxyCODONE, oxyCODONE, phenol, polyethylene glycol   Vital Signs    Vitals:   04/09/22 2310 04/10/22 0325 04/10/22 0626 04/10/22 0800  BP: (!) 99/53 121/66  113/80  Pulse: (!) 37 84  100  Resp: '16 19  17  '$ Temp: 98.4 F (36.9 C) 98.3 F (36.8 C)  98.4 F (36.9 C)  TempSrc: Oral Oral  Oral  SpO2: 97% 98%  95%  Weight:   96.6 kg   Height:        Intake/Output Summary (Last 24 hours) at 04/10/2022  1110 Last data filed at 04/10/2022 1007 Gross per 24 hour  Intake 630 ml  Output 4245 ml  Net -3615 ml      04/10/2022    6:26 AM 04/09/2022    5:05 AM 04/06/2022    8:33 AM  Last 3 Weights  Weight (lbs) 212 lb 15.4 oz 301 lb 296 lb  Weight (kg) 96.6 kg 136.533 kg 134.265 kg      Telemetry    Atrial fibrillation, rate controlled- Personally Reviewed  ECG    No new tracing- Personally Reviewed  Physical Exam  Morbidly obese GEN: No acute distress.   Neck: Difficult to see JVD Cardiac: Irregular, 2/6 aortic ejection murmur at the right upper sternal border, apical holosystolic murmur appears less prominent no diastolic murmurs, rubs, or gallops.  Respiratory: Diminished breath sounds in both bases slightly worse on the right, no rales or wheezes. GI: Soft, nontender, non-distended  MS: 3+ deep pitting symmetrical edema the feet, ankles and lower half of the shins; No deformity. Neuro:  Nonfocal  Psych: Normal affect   Labs    High Sensitivity Troponin:   Recent Labs  Lab 04/06/22 1253 04/06/22 1451  TROPONINIHS 21* 27*     Chemistry Recent Labs  Lab 04/08/22 0125 04/09/22 1125 04/10/22 0429  NA 129* 134* 135  K 4.1 3.7 3.7  CL  97* 96* 96*  CO2 '22 27 28  '$ GLUCOSE 130* 164* 139*  BUN 51* 41* 34*  CREATININE 1.56* 1.22 1.15  CALCIUM 9.1 9.3 9.0  MG 1.9 1.4* 1.7  ALBUMIN 2.9* 2.7* 2.6*  GFRNONAA 45* >60 >60  ANIONGAP '10 11 11    '$ Lipids No results for input(s): "CHOL", "TRIG", "HDL", "LABVLDL", "LDLCALC", "CHOLHDL" in the last 168 hours.  Hematology Recent Labs  Lab 04/08/22 0125 04/09/22 1125 04/10/22 0429  WBC 10.9* 11.9* 15.1*  RBC 3.50* 3.78* 3.77*  HGB 10.9* 11.8* 11.7*  HCT 32.5* 34.3* 34.8*  MCV 92.9 90.7 92.3  MCH 31.1 31.2 31.0  MCHC 33.5 34.4 33.6  RDW 14.4 14.9 15.1  PLT 200 237 252   Thyroid No results for input(s): "TSH", "FREET4" in the last 168 hours.  BNP Recent Labs  Lab 04/06/22 1253  BNP 682.5*    DDimer No results for  input(s): "DDIMER" in the last 168 hours.   Radiology    No results found.  Cardiac Studies   Echocardiogram 04/07/2022     1. Left ventricular ejection fraction, by estimation, is 50 to 55%. The  left ventricle has mildly decreased function. The left ventricle has no  regional wall motion abnormalities. There is mild concentric left  ventricular hypertrophy. Left ventricular  diastolic function could not be evaluated. There is the interventricular  septum is flattened in systole and diastole, consistent with right  ventricular pressure and volume overload.   2. Right ventricular systolic function is moderately reduced. The right  ventricular size is moderately enlarged. There is severely elevated  pulmonary artery systolic pressure. The estimated right ventricular  systolic pressure is 28.7 mmHg.   3. Left atrial size was severely dilated.   4. Right atrial size was severely dilated.   5. The mitral valve is grossly normal. Severe mitral valve regurgitation.  Moderate mitral annular calcification.   6. The aortic valve is tricuspid. There is severe calcifcation of the  aortic valve. There is severe thickening of the aortic valve. Aortic valve  regurgitation is not visualized. Moderate aortic valve stenosis. Aortic  valve mean gradient measures 17.0  mmHg. Aortic valve Vmax measures 2.71 m/s. Aortic valve acceleration time  measures 120 msec.   7. Aortic dilatation noted. There is mild dilatation of the ascending  aorta, measuring 41 mm. There is mild dilatation of the aortic root,  measuring 39 mm.   8. The inferior vena cava is dilated in size with <50% respiratory  variability, suggesting right atrial pressure of 15 mmHg.   Comparison(s): Prior images reviewed side by side. The left ventricular  function is worsened. Mitral insufficiency is worse.      Cardiac catheterization 04/30/2020   Ramus lesion is 75% stenosed. Mid Cx lesion is 99% stenosed. Post intervention,  there is a 0% residual stenosis. A drug-eluting stent was successfully placed using a STENT RESOLUTE ONYX 2.5X34. The left ventricular systolic function is normal. LV end diastolic pressure is mildly elevated. The left ventricular ejection fraction is 55-65% by visual estimate.   1. 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 Good LV systolic function 3. Elevated LVEDP 4. Successful PCI of the mid to distal LCx with DES x 1.   Plan: ASA 81 mg daily for one month. Plavix 75 mg daily for one year. May resume Eliquis tomorrow. IV diuresis. Will need to stop Flecainide. Consider alternative AAD therapy.   Diagnostic Dominance: Right  Intervention    Implants  Permanent Stent  Stent Resolute Onyx 2.5x34       Patient Profile     76 y.o. male with permanent atrial fibrillation, severe mitral insufficiency, moderate aortic stenosis, coronary artery disease (left circumflex drug-eluting stent October 2021) heart failure with preserved ejection fraction, severe pulmonary hypertension, CKD stage IIIb, mild dilation of the ascending aorta, had transient bradycardia and hypotension during induction of anesthesia for left leg abscess debridement.  Assessment & Plan    CHF: Still clearly severely hypervolemic, continue intravenous diuretics.  Dry weight uncertain but definitely less than 296 pounds based on records.  Minimally decreased LVEF.  The driver appears to be valvular heart disease.  He has both moderate aortic stenosis and what appears to be severe mitral regurgitation, new finding compared with previous echocardiograms.   MR: Severe MR was a new finding on this admission.  May need transesophageal echocardiography to evaluate the mitral valve in more detail.  Transthoracic echo image quality is not great and it is difficult to establish the etiology. Differential diagnosis includes endocarditis (skin source? Negative blood cultures 03/01/22), ischemic tethering  (known LCX disease w stent 2021), even transient myocardial stunning following his brief arrest. Reassess limited TTE later this week before deciding on TEE this week or after healing of wound.  Thankfully, Serratia is a very unlikely cause of endocarditis. AS: Appears to be squarely in moderate range, probably not severe enough to cause the MR. PAH: Severe and associated with significant RV distention and failure.  In large part may be due to left heart disease, but probably also due to obstructive sleep apnea.  At some point will need right and left heart catheterization, once we believe him to be close to euvolemia.  Also reevaluate by echo later this week. Afib: Permanent arrhythmia, well rate controlled, appropriately anticoagulated. CKD3: Creatinine has shown steady improvement with aggressive diuresis. CAD: Does not have angina pectoris.  Improved musculoskeletal pain following his brief CPR.  History of stents roughly 2 years ago.  Left circumflex coronary artery ischemia may explain mitral insufficiency, but he has not had angina or ECG changes to suggest active ischemia. May need R and L heart cath due to MR. Cellulitis/leg abscess: Ideally we will perform further cardiac output once this has healed.  Note Serratia growing from the abscess. Hyponatremia: Has normalized with diuresis    For questions or updates, please contact [place] Please consult [email or web address] for contact info under        Signed, Sanda Klein, MD  04/10/2022, 11:10 AM

## 2022-04-10 NOTE — Progress Notes (Addendum)
PROGRESS NOTE/Consult    Ryan Garner  NWG:[id number] DOB: [birth year] DOA: 04/04/2022 PCP: [name], [name], MD    No chief complaint on file.   Brief Narrative:  Patient 76 year old gentleman history of hypertension, hyperlipidemia, CAD with history of STEMI status post PCI, PAH, rate controlled A-fib on anticoagulation, type 2 diabetes, morbid obesity who was admitted to orthopedic service, per Dr. Sharol Given for left leg cellulitis and abscess.  Patient recently hospitalized from 8/10 to 8/14 for recurrent cellulitis of the left leg at [facility] and failed outpatient management with Augmentin.  Patient noted to have been discharged on Doxy following hospitalization wound got worse with redness swelling and pain seen by PCP placed on cefdinir and subsequently transition to Bactrim.  Despite oral antibiotics wound worsening patient developed an abscess seen in the office by Dr. Sharol Given on 911, admitted placed on IV Vanco Flagyl and Rocephin taken to the OR on 04/06/2022.  During induction for intubation patient became bradycardic hypotensive received CPR temporary placed on pressors stabilized and he proceeded with debridement surgery.  Postoperatively patient noted to have been weaned off pressors extubated successfully.  Patient with some substernal chest discomfort.  Chest x-ray noted hazy opacification of the right mid to lower lung with possible right-sided effusion.  Hospitalist consulted for hypotension and bradycardia.  Cardiology consulted.  ID consulted and following.   Assessment & Plan:   Principal Problem:   Abscess of leg, left Active Problems:   Atrial fibrillation (HCC)   Transient hypotension   Acute kidney injury superimposed on chronic kidney disease (HCC)   Hyponatremia   Bradycardia   (HFpEF) heart failure with preserved ejection fraction (HCC)   Hypotension   Nonrheumatic mitral (valve) insufficiency   Aortic valve stenosis, nonrheumatic  #1 left lower extremity  cellulitis with abscess -Status post I and D per Dr. Sharol Given 04/06/2022 with placement of wound VAC. -Initial cultures from left leg debridement 04/04/2022 per ID growing rare Serratia marcescens. -MRSA PCR negative. -IV vancomycin, Flagyl, IV Rocephin discontinued and patient initially started on cefepime and doxycycline per ID recommendations.   -Cefepime discontinued per ID and patient currently on doxycycline.   -Per primary team,/orthopedics, Dr. Sharol Given and per ID.    2.  Leukocytosis -Likely secondary to problem #1. -Continue IV antibiotics as directed by ID.  3.  Transient hypotension and bradycardia -Patient noted to be hypotensive and bradycardic for which CPR had to be started after patient given induction medications for intubation. -Patient extubated postprocedure, vasopressin weaned off. -Patient with complaints of chest soreness. -EKG with no significant changes. -Hypotension bradycardia likely secondary to induction of intubation. -Noted to have been on metoprolol 50 mg the morning prior to procedure. -Cardiac enzymes slightly elevated but flat. -2D echo with EF of 50 to 55%,NWMA, mild concentric LVH, intraventricular septum flattening in systole and diastole consistent with right ventricular pressure and volume overload, moderately reduced right ventricular systolic function, right ventricular size is moderately enlarged, severely elevated PA systolic pressure, severely dilated left atrial size, severely dilated right atrial size, severe MVR.  Moderate mitral annular calcification.  Moderate aortic valve stenosis.  Aortic dilatation noted. -Cardiology following.  4.  Acute respiratory failure secondary to acute on chronic diastolic CHF -Patient with volume overload on examination with 3-4+ bilateral pitting lower extremity edema, crackles noted on examination. -Patient noted with moderate aortic valve stenosis, severe MVR which may be etiology of his acute on chronic CHF  exacerbation. -Patient with shortness of breath which she states is chronic. -  Patient noted to be on Lasix 80 mg orally. -BNP elevated at 682.5. --2D echo with EF of 50 to 55%,NWMA, mild concentric LVH, intraventricular septum flattening in systole and diastole consistent with right ventricular pressure and volume overload, moderately reduced right ventricular systolic function, right ventricular size is moderately enlarged, severely elevated PA systolic pressure, severely dilated left atrial size, severely dilated right atrial size, severe MVR.  Moderate mitral annular calcification.  Moderate aortic valve stenosis.  Aortic dilatation noted. -Patient was on Lasix 40 mg IV every 12 hours (04/06/2022) with urine output of 1.050 L, Lasix dose increased to 60 mg IV every 12 hours per cardiology with urine output of 4.1 L over the past 24 hours. -Patient is -16.556 L during this hospitalization. -Current weight of 212.96 pounds from 296 pounds on admission. -Status post IV albumin twice daily on 04/07/2022, 04/08/2022 to aid with diuresis and as blood pressure was borderline.   -Patient with complaints of shortness of breath, gurgling, still significantly volume overloaded on examination. -We will place on IV albumin every 12 hours x2 doses, will give extra dose of Lasix 60 mg IV x1 now. -Continue Lasix 60 mg IV every 12 hours. -Per cardiology.  5.  Severe MVR/moderate AAS/PAH -Cardiology following few patient may need a TEE to evaluate mitral valve in more detail, cardiology recommended a limited TTE to reassess later on this week before deciding on TEE this week for further evaluation. -Per cardiology.  6.  AKI superimposed on CKD stage IIIa/urinary retention -Creatinine noted to be trending up to 1.83 with a BUN of 47 early on in the hospitalization, prior creatinine noted at 1.2 on 03/03/2022. -Renal function improving with diuresis creatinine down to 1.15. -Likely secondary to prerenal azotemia  secondary to volume overload in the setting of possible postobstructive diuresis as patient with urinary retention. -Patient was of urinary retention per RN on 04/07/2022. -Foley catheter ordered and placed. -Renal ultrasound with bilateral renal cortical thinning consistent with chronic medical renal disease, no hydronephrosis -Patient with urine output of 4.1 L over the past 24 hours. -Continue IV Lasix. -Strict I's and O's, daily weights. -Follow.  7.  Normocytic anemia -Hemoglobin at 11.7 Follow H&H.  8.  Hyperkalemia -Resolved. -Potassium at 3.7. -Monitor with diuresis.  9.  Hyponatremia -Likely secondary to hypervolemic hyponatremia. -Improving with diuresis.   -Continue IV diuretics.  10.  Hypomagnesemia -Magnesium at 1.7 today. -Magnesium sulfate 2 g IV x1. -Repeat labs in the AM.   DVT prophylaxis: Eliquis Code Status: Full Family Communication: Updated patient.  No family at bedside. Disposition: Per primary team  Status is: Inpatient Remains inpatient appropriate because: Severity of illness   Consultants:  Cardiology: Dr. Harriet Masson 04/06/2022 Infectious disease: Dr. Candiss Norse 04/05/2022 Hospitalist: Dr. Tamala Julian 04/06/2022  Procedures:  Chest x-ray 04/06/2022 MRI left tibia 04/04/2022 Renal ultrasound 04/07/2022 pending 2D echo 04/07/2022 Left leg debridement with application of wound VAC per Dr. Sharol Given 04/06/2022  Antimicrobials:  IV Ancef 04/04/2022 x 1 dose IV Rocephin 04/04/2022>>>>04/09/2022 IV vancomycin 04/04/2022>>>> 04/09/2022 Oral Flagyl 04/05/2022>>> 04/09/2022 Oral doxycycline 04/09/2022>>> IV cefepime 04/09/2022>>> 04/10/2022   Subjective: Sitting up in chair, states he is feels like he is gurgling in his lungs, asking for breathing treatment.  Denies any chest pain.  No abdominal pain.  Asking to be placed back in bed.  Noted to have had a bowel movement after enema yesterday.  Objective: Vitals:   04/10/22 0626 04/10/22 0800 04/10/22 1133 04/10/22 1526  BP:   113/80 123/64 127/78  Pulse:  100 94 (!) 111  Resp:  '17 20 20  '$ Temp:  98.4 F (36.9 C) 98.4 F (36.9 C) 99 F (37.2 C)  TempSrc:  Oral Oral Oral  SpO2:  95% 97% 95%  Weight: 96.6 kg     Height:        Intake/Output Summary (Last 24 hours) at 04/10/2022 1707 Last data filed at 04/10/2022 1600 Gross per 24 hour  Intake 730 ml  Output 3595 ml  Net -2865 ml   Filed Weights   04/06/22 0833 04/09/22 0505 04/10/22 0626  Weight: 134.3 kg (!) 136.5 kg 96.6 kg    Examination:  General exam: NAD. Respiratory system: Diffuse gurgling noted.  Diffuse crackles.  Fair air movement.  No wheezing.  Speaking in full sentences.   Cardiovascular system: Irregularly irregular.  3+ bilateral lower extremity pitting edema.  Extremity less tight.  Gastrointestinal system: Abdomen is soft, nontender, nondistended, positive bowel sounds.  No rebound.  No guarding. Central nervous system: Alert and oriented. No focal neurological deficits. Extremities: Symmetric 5 x 5 power.  Postop dressing left lower extremity with wound VAC.  Right lower extremity with 3+ pitting edema. Skin: No rashes, lesions or ulcers Psychiatry: Judgement and insight appear normal. Mood & affect appropriate.     Data Reviewed: I have personally reviewed following labs and imaging studies  CBC: Recent Labs  Lab 04/06/22 0112 04/07/22 0223 04/08/22 0125 04/09/22 1125 04/10/22 0429  WBC 12.7* 12.4* 10.9* 11.9* 15.1*  NEUTROABS  --   --  8.2* 9.7* 12.5*  HGB 11.4* 11.3* 10.9* 11.8* 11.7*  HCT 33.7* 33.3* 32.5* 34.3* 34.8*  MCV 92.6 92.2 92.9 90.7 92.3  PLT 256 225 200 237 992    Basic Metabolic Panel: Recent Labs  Lab 04/06/22 1253 04/07/22 0223 04/08/22 0125 04/09/22 1125 04/10/22 0429  NA 129* 129* 129* 134* 135  K 4.8 5.0 4.1 3.7 3.7  CL 96* 92* 97* 96* 96*  CO2 '22 24 22 27 28  '$ GLUCOSE 126* 154* 130* 164* 139*  BUN 48* 52* 51* 41* 34*  CREATININE 1.84* 1.84* 1.56* 1.22 1.15  CALCIUM 8.9 9.1 9.1 9.3 9.0   MG 1.6* 2.0 1.9 1.4* 1.7  PHOS  --   --  3.5 3.1 2.9    GFR: Estimated Creatinine Clearance: 59.7 mL/min (by C-G formula based on SCr of 1.15 mg/dL).  Liver Function Tests: Recent Labs  Lab 04/08/22 0125 04/09/22 1125 04/10/22 0429  ALBUMIN 2.9* 2.7* 2.6*    CBG: Recent Labs  Lab 04/04/22 0922 04/04/22 1021 04/06/22 0813 04/06/22 1047 04/06/22 1207  GLUCAP 114* 106* 130* 121* 119*     Recent Results (from the past 240 hour(s))  Aerobic/Anaerobic Culture w Gram Stain (surgical/deep wound)     Status: None (Preliminary result)   Collection Time: 04/04/22  9:44 AM   Specimen: PATH Soft tissue  Result Value Ref Range Status   Specimen Description LEG  Final   Special Requests  LEFT DEBRIDEMENT  Final   Gram Stain NO WBC SEEN NO ORGANISMS SEEN   Final   Culture   Final    RARE SERRATIA MARCESCENS SUSCEPTIBILITIES TO FOLLOW NO ANAEROBES ISOLATED Performed at [facility], [place] [address] [place], [place] [address]    Report Status PENDING  Incomplete  Surgical pcr screen     Status: None   Collection Time: 04/05/22  8:11 PM   Specimen: Nasal Mucosa; Nasal Swab  Result Value Ref Range Status   MRSA, PCR NEGATIVE  NEGATIVE Final   Staphylococcus aureus NEGATIVE NEGATIVE Final    Comment: (NOTE) The Xpert SA Assay (FDA approved for NASAL specimens in patients 53 years of age and older), is one component of a comprehensive surveillance program. It is not intended to diagnose infection nor to guide or monitor treatment. Performed at [facility], [place] [address], [place], [place] [address]   MRSA Next Gen by PCR, Nasal     Status: None   Collection Time: 04/06/22 12:25 PM   Specimen: Nasal Mucosa; Nasal Swab  Result Value Ref Range Status   MRSA by PCR Next Gen NOT DETECTED NOT DETECTED Final    Comment: (NOTE) The GeneXpert MRSA Assay (FDA approved for NASAL specimens only), is one component of a comprehensive MRSA colonization  surveillance program. It is not intended to diagnose MRSA infection nor to guide or monitor treatment for MRSA infections. Test performance is not FDA approved in patients less than 67 years old. Performed at [facility], [place] [address] [place],  [address]          Radiology Studies: No results found.      Scheduled Meds:  apixaban  5 mg Oral BID   vitamin C  1,000 mg Oral Daily   atorvastatin  80 mg Oral Daily   Chlorhexidine Gluconate Cloth  6 each Topical Daily   clopidogrel  75 mg Oral Q breakfast   docusate sodium  100 mg Oral BID   doxycycline  100 mg Oral Q12H   furosemide  60 mg Intravenous BID   metoprolol tartrate  12.5 mg Oral BID   nutrition supplement (JUVEN)  1 packet Oral BID BM   pantoprazole  40 mg Oral Daily   potassium chloride  20 mEq Oral Once per day on Sun Tue Thu Sat   And   potassium chloride  40 mEq Oral Q M,W,F   zinc sulfate  220 mg Oral Daily   Continuous Infusions:  albumin human 25 g (04/10/22 1448)   ceFEPime (MAXIPIME) IV     methocarbamol (ROBAXIN) IV       LOS: 6 days    Time spent: 40 minutes    Irine Seal, MD [facility]   To contact the attending provider between 7A-7P or the covering provider during after hours 7P-7A, please log into the web site [email or web address] and access using universal [place] password for that web site. If you do not have the password, please call the hospital operator.  04/10/2022, 5:07 PM

## 2022-04-10 NOTE — Progress Notes (Signed)
[facility] for Infectious Disease  Date of Admission:  04/04/2022     Total days of antibiotics 7         ASSESSMENT:  Ryan Garner is POD #4 from debridement of abscess of the left proximal tibia with cultures growing Serratia with sensitivities remaining pending. Will continue current dose of Cefepime with final antibiotic recommendations pending sensitivity results. Continue wound care per Orthopedics. Disposition pending Physical Therapy recommendations.   PLAN:  Continue cefepime.  Monitor cultures for Serratia sensitivity.  Post-operative wound care per Orthopedics. Remaining medical and supportive care per primary team.   Principal Problem:   Abscess of leg, left Active Problems:   Acute kidney injury superimposed on chronic kidney disease (HCC)   Hyponatremia   Atrial fibrillation (HCC)   Transient hypotension   Bradycardia   (HFpEF) heart failure with preserved ejection fraction (HCC)   Hypotension   Nonrheumatic mitral (valve) insufficiency   Aortic valve stenosis, nonrheumatic    apixaban  5 mg Oral BID   vitamin C  1,000 mg Oral Daily   atorvastatin  80 mg Oral Daily   Chlorhexidine Gluconate Cloth  6 each Topical Daily   clopidogrel  75 mg Oral Q breakfast   docusate sodium  100 mg Oral BID   doxycycline  100 mg Oral Q12H   furosemide  60 mg Intravenous BID   metoprolol tartrate  12.5 mg Oral BID   nutrition supplement (JUVEN)  1 packet Oral BID BM   pantoprazole  40 mg Oral Daily   potassium chloride  20 mEq Oral Once per day on Sun Tue Thu Sat   And   potassium chloride  40 mEq Oral Q M,W,F   zinc sulfate  220 mg Oral Daily    SUBJECTIVE:  Afebrile overnight with no acute events. Sitting in the chair with no new concerns/complaints.   Allergies  Allergen Reactions   Flecainide     Per patient dizziness, shortness of breath, blurred vision   Rofecoxib Rash and Other (See Comments)    (Vioxx)     Review of Systems: Review of Systems   Constitutional:  Negative for chills, fever and weight loss.  Respiratory:  Negative for cough, shortness of breath and wheezing.   Cardiovascular:  Negative for chest pain and leg swelling.  Gastrointestinal:  Negative for abdominal pain, constipation, diarrhea, nausea and vomiting.  Skin:  Negative for rash.      OBJECTIVE: Vitals:   04/10/22 0325 04/10/22 0626 04/10/22 0800 04/10/22 1133  BP: 121/66  113/80 123/64  Pulse: 84  100 94  Resp: '19  17 20  '$ Temp: 98.3 F (36.8 C)  98.4 F (36.9 C) 98.4 F (36.9 C)  TempSrc: Oral  Oral Oral  SpO2: 98%  95% 97%  Weight:  96.6 kg    Height:       Body mass index is 32.38 kg/m.  Physical Exam Constitutional:      General: He is not in acute distress.    Appearance: He is well-developed.     Comments: Seated in the chair next to the bed; pleasant.   Cardiovascular:     Rate and Rhythm: Normal rate and regular rhythm.     Heart sounds: Normal heart sounds.  Pulmonary:     Effort: Pulmonary effort is normal.     Breath sounds: Normal breath sounds.  Skin:    General: Skin is warm and dry.  Neurological:     Mental Status: He is  alert and oriented to person, place, and time.  Psychiatric:        Mood and Affect: Mood normal.     Lab Results Lab Results  Component Value Date   WBC 15.1 (H) 04/10/2022   HGB 11.7 (L) 04/10/2022   HCT 34.8 (L) 04/10/2022   MCV 92.3 04/10/2022   PLT 252 04/10/2022    Lab Results  Component Value Date   CREATININE 1.15 04/10/2022   BUN 34 (H) 04/10/2022   NA 135 04/10/2022   K 3.7 04/10/2022   CL 96 (L) 04/10/2022   CO2 28 04/10/2022    Lab Results  Component Value Date   ALT 19 03/08/2022   AST 22 03/08/2022   ALKPHOS 96 03/08/2022   BILITOT 0.9 03/08/2022     Microbiology: Recent Results (from the past 240 hour(s))  Aerobic/Anaerobic Culture w Gram Stain (surgical/deep wound)     Status: None (Preliminary result)   Collection Time: 04/04/22  9:44 AM   Specimen: PATH Soft  tissue  Result Value Ref Range Status   Specimen Description LEG  Final   Special Requests  LEFT DEBRIDEMENT  Final   Gram Stain NO WBC SEEN NO ORGANISMS SEEN   Final   Culture   Final    RARE SERRATIA MARCESCENS SUSCEPTIBILITIES TO FOLLOW NO ANAEROBES ISOLATED Performed at [facility], [place] [address] [place], [place] [address]    Report Status PENDING  Incomplete  Surgical pcr screen     Status: None   Collection Time: 04/05/22  8:11 PM   Specimen: Nasal Mucosa; Nasal Swab  Result Value Ref Range Status   MRSA, PCR NEGATIVE NEGATIVE Final   Staphylococcus aureus NEGATIVE NEGATIVE Final    Comment: (NOTE) The Xpert SA Assay (FDA approved for NASAL specimens in patients 70 years of age and older), is one component of a comprehensive surveillance program. It is not intended to diagnose infection nor to guide or monitor treatment. Performed at [facility], [place] [address] [place], [place] [address]   MRSA Next Gen by PCR, Nasal     Status: None   Collection Time: 04/06/22 12:25 PM   Specimen: Nasal Mucosa; Nasal Swab  Result Value Ref Range Status   MRSA by PCR Next Gen NOT DETECTED NOT DETECTED Final    Comment: (NOTE) The GeneXpert MRSA Assay (FDA approved for NASAL specimens only), is one component of a comprehensive MRSA colonization surveillance program. It is not intended to diagnose MRSA infection nor to guide or monitor treatment for MRSA infections. Test performance is not FDA approved in patients less than 50 years old. Performed at [facility], [place] [address], [place], [place] [address]      Terri Piedra, [facility] for Infectious Disease [facility]  04/10/2022  11:54 AM

## 2022-04-11 DIAGNOSIS — L02416 Cutaneous abscess of left lower limb: Secondary | ICD-10-CM | POA: Diagnosis not present

## 2022-04-11 LAB — CBC WITH DIFFERENTIAL/PLATELET
Abs Immature Granulocytes: 0.08 10*3/uL — ABNORMAL HIGH (ref 0.00–0.07)
Basophils Absolute: 0 10*3/uL (ref 0.0–0.1)
Basophils Relative: 0 %
Eosinophils Absolute: 0.4 10*3/uL (ref 0.0–0.5)
Eosinophils Relative: 3 %
HCT: 29.4 % — ABNORMAL LOW (ref 39.0–52.0)
Hemoglobin: 10.2 g/dL — ABNORMAL LOW (ref 13.0–17.0)
Immature Granulocytes: 1 %
Lymphocytes Relative: 10 %
Lymphs Abs: 1.4 10*3/uL (ref 0.7–4.0)
MCH: 31.8 pg (ref 26.0–34.0)
MCHC: 34.7 g/dL (ref 30.0–36.0)
MCV: 91.6 fL (ref 80.0–100.0)
Monocytes Absolute: 1.6 10*3/uL — ABNORMAL HIGH (ref 0.1–1.0)
Monocytes Relative: 12 %
Neutro Abs: 10.2 10*3/uL — ABNORMAL HIGH (ref 1.7–7.7)
Neutrophils Relative %: 74 %
Platelets: 209 10*3/uL (ref 150–400)
RBC: 3.21 MIL/uL — ABNORMAL LOW (ref 4.22–5.81)
RDW: 15.2 % (ref 11.5–15.5)
WBC: 13.6 10*3/uL — ABNORMAL HIGH (ref 4.0–10.5)
nRBC: 0 % (ref 0.0–0.2)

## 2022-04-11 LAB — BASIC METABOLIC PANEL
Anion gap: 12 (ref 5–15)
BUN: 34 mg/dL — ABNORMAL HIGH (ref 8–23)
CO2: 28 mmol/L (ref 22–32)
Calcium: 9.2 mg/dL (ref 8.9–10.3)
Chloride: 96 mmol/L — ABNORMAL LOW (ref 98–111)
Creatinine, Ser: 1.01 mg/dL (ref 0.61–1.24)
GFR, Estimated: >60 mL/min (ref >60)
Glucose, Bld: 115 mg/dL — ABNORMAL HIGH (ref 70–99)
Potassium: 3.2 mmol/L — ABNORMAL LOW (ref 3.5–5.1)
Sodium: 136 mmol/L (ref 135–145)

## 2022-04-11 LAB — AEROBIC/ANAEROBIC CULTURE W GRAM STAIN (SURGICAL/DEEP WOUND): Gram Stain: NONE SEEN

## 2022-04-11 LAB — MAGNESIUM: Magnesium: 1.7 mg/dL (ref 1.7–2.4)

## 2022-04-11 MED ORDER — DOXYCYCLINE HYCLATE 100 MG PO TABS
100.0000 mg | ORAL_TABLET | Freq: Two times a day (BID) | ORAL | Status: DC
  Administered 2022-04-11 – 2022-04-14 (×6): 100 mg via ORAL
  Filled 2022-04-11 (×6): qty 1

## 2022-04-11 MED ORDER — METOPROLOL TARTRATE 25 MG PO TABS
25.0000 mg | ORAL_TABLET | Freq: Two times a day (BID) | ORAL | Status: DC
  Administered 2022-04-11 – 2022-04-14 (×5): 25 mg via ORAL
  Filled 2022-04-11 (×5): qty 1

## 2022-04-11 MED ORDER — LEVOFLOXACIN 750 MG PO TABS
750.0000 mg | ORAL_TABLET | Freq: Every day | ORAL | Status: DC
  Administered 2022-04-11 – 2022-04-13 (×3): 750 mg via ORAL
  Filled 2022-04-11 (×4): qty 1

## 2022-04-11 NOTE — Progress Notes (Signed)
[facility] for Infectious Disease  Date of Admission:  04/04/2022     Total days of antibiotics 8         ASSESSMENT:  [name] is sensitive to Bactrim and Flouroquinolones. Will proceed with oral therapy with 750 mg of levofloxacin daily for a total of 3 weeks with estimated of treatment of 04/27/22. Continue wound care per orthopedics.  Will arrange follow up in ID clinic.   PLAN:  Change cefepime to 750 mg levofloxacin. End date of treatment 04/27/22.  Wound care per orthopedics.  Follow up in ID clinic in 3 weeks with Dr. Candiss Garner.   Principal Problem:   Abscess of leg, left Active Problems:   Acute kidney injury superimposed on chronic kidney disease (HCC)   Hyponatremia   Atrial fibrillation (HCC)   Transient hypotension   Bradycardia   (HFpEF) heart failure with preserved ejection fraction (HCC)   Hypotension   Nonrheumatic mitral (valve) insufficiency   Aortic valve stenosis, nonrheumatic    apixaban  5 mg Oral BID   vitamin C  1,000 mg Oral Daily   atorvastatin  80 mg Oral Daily   Chlorhexidine Gluconate Cloth  6 each Topical Daily   clopidogrel  75 mg Oral Q breakfast   docusate sodium  100 mg Oral BID   furosemide  60 mg Intravenous BID   levofloxacin  750 mg Oral QHS   metoprolol tartrate  25 mg Oral BID   nutrition supplement (JUVEN)  1 packet Oral BID BM   pantoprazole  40 mg Oral Daily   potassium chloride  20 mEq Oral Once per day on Sun Tue Thu Sat   And   potassium chloride  40 mEq Oral Q M,W,F   zinc sulfate  220 mg Oral Daily    SUBJECTIVE:  Afebrile overnight with no acute events. No new concern/complaints.   Allergies  Allergen Reactions   Flecainide     Per patient dizziness, shortness of breath, blurred vision   Rofecoxib Rash and Other (See Comments)    (Vioxx)     Review of Systems: Review of Systems  Constitutional:  Negative for chills, fever and weight loss.  Respiratory:  Negative for cough,  shortness of breath and wheezing.   Cardiovascular:  Negative for chest pain and leg swelling.  Gastrointestinal:  Negative for abdominal pain, constipation, diarrhea, nausea and vomiting.  Skin:  Negative for rash.      OBJECTIVE: Vitals:   04/10/22 1947 04/11/22 0012 04/11/22 0429 04/11/22 0800  BP: (!) 114/52 115/60 119/65   Pulse: 94 87 98   Resp: '20 17 20 16  '$ Temp: 98.3 F (36.8 C) 98 F (36.7 C) 98.1 F (36.7 C) 98.5 F (36.9 C)  TempSrc: Oral Oral Oral Oral  SpO2: 90% 95% 95%   Weight:   130.8 kg   Height:       Body mass index is 43.85 kg/m.  Physical Exam Constitutional:      General: He is not in acute distress.    Appearance: He is well-developed.  Cardiovascular:     Rate and Rhythm: Normal rate and regular rhythm.     Heart sounds: Normal heart sounds.  Pulmonary:     Effort: Pulmonary effort is normal.     Breath sounds: Normal breath sounds.  Musculoskeletal:     Comments: Wound vac in place with appropriate suction. There is serosanguinous drainage in the canister.   Skin:    General: Skin is  warm and dry.  Neurological:     Mental Status: He is alert and oriented to person, place, and time.  Psychiatric:        Mood and Affect: Mood normal.     Lab Results Lab Results  Component Value Date   WBC 13.6 (H) 04/11/2022   HGB 10.2 (L) 04/11/2022   HCT 29.4 (L) 04/11/2022   MCV 91.6 04/11/2022   PLT 209 04/11/2022    Lab Results  Component Value Date   CREATININE 1.01 04/11/2022   BUN 34 (H) 04/11/2022   NA 136 04/11/2022   K 3.2 (L) 04/11/2022   CL 96 (L) 04/11/2022   CO2 28 04/11/2022    Lab Results  Component Value Date   ALT 19 03/08/2022   AST 22 03/08/2022   ALKPHOS 96 03/08/2022   BILITOT 0.9 03/08/2022     Microbiology: Recent Results (from the past 240 hour(s))  Aerobic/Anaerobic Culture w Gram Stain (surgical/deep wound)     Status: None   Collection Time: 04/04/22  9:44 AM   Specimen: PATH Soft tissue  Result Value  Ref Range Status   Specimen Description LEG  Final   Special Requests  LEFT DEBRIDEMENT  Final   Gram Stain NO WBC SEEN NO ORGANISMS SEEN   Final   Culture   Final    RARE [name] NO ANAEROBES ISOLATED Performed at [facility], [place] [address] [place], [place] [address]    Report Status 04/11/2022 FINAL  Final   Organism ID, Bacteria [name]  Final      Susceptibility   [name] - MIC*    CEFAZOLIN >=64 RESISTANT Resistant     CEFEPIME <=0.12 SENSITIVE Sensitive     CEFTAZIDIME <=1 SENSITIVE Sensitive     CEFTRIAXONE <=0.25 SENSITIVE Sensitive     CIPROFLOXACIN <=0.25 SENSITIVE Sensitive     GENTAMICIN <=1 SENSITIVE Sensitive     TRIMETH/SULFA <=20 SENSITIVE Sensitive     * RARE [name]  Surgical pcr screen     Status: None   Collection Time: 04/05/22  8:11 PM   Specimen: Nasal Mucosa; Nasal Swab  Result Value Ref Range Status   MRSA, PCR NEGATIVE NEGATIVE Final   Staphylococcus aureus NEGATIVE NEGATIVE Final    Comment: (NOTE) The Xpert SA Assay (FDA approved for NASAL specimens in patients 35 years of age and older), is one component of a comprehensive surveillance program. It is not intended to diagnose infection nor to guide or monitor treatment. Performed at [facility], [place] [address], [place], [place] [address]   MRSA Next Gen by PCR, Nasal     Status: None   Collection Time: 04/06/22 12:25 PM   Specimen: Nasal Mucosa; Nasal Swab  Result Value Ref Range Status   MRSA by PCR Next Gen NOT DETECTED NOT DETECTED Final    Comment: (NOTE) The GeneXpert MRSA Assay (FDA approved for NASAL specimens only), is one component of a comprehensive MRSA colonization surveillance program. It is not intended to diagnose MRSA infection nor to guide or monitor treatment for MRSA infections. Test performance is not FDA approved in patients less than 16 years old. Performed at [facility], [place] [address]  [place], [place] [address]      Ryan Garner, [facility] for Infectious Disease [facility]  04/11/2022  11:30 AM

## 2022-04-11 NOTE — Progress Notes (Addendum)
Rounding Note    Patient Name: Ryan Garner Date of Encounter: 04/11/2022  [place] Cardiologist: Dorris Carnes, MD   Subjective   No acute overnight events. Patient states he has had chronic dyspnea for the past 2 years, mostly with exertion. He does state this seems better with the Lasix. He notes some mild chest soreness due to brief CPR but no other chest pain. He has permanent atrial fibrillation and states he sometimes feels his heart beating irregularly but no palpitations at this time.  Inpatient Medications    Scheduled Meds:  apixaban  5 mg Oral BID   vitamin C  1,000 mg Oral Daily   atorvastatin  80 mg Oral Daily   Chlorhexidine Gluconate Cloth  6 each Topical Daily   clopidogrel  75 mg Oral Q breakfast   docusate sodium  100 mg Oral BID   doxycycline  100 mg Oral Q12H   furosemide  60 mg Intravenous BID   metoprolol tartrate  12.5 mg Oral BID   nutrition supplement (JUVEN)  1 packet Oral BID BM   pantoprazole  40 mg Oral Daily   potassium chloride  20 mEq Oral Once per day on Sun Tue Thu Sat   And   potassium chloride  40 mEq Oral Q M,W,F   zinc sulfate  220 mg Oral Daily   Continuous Infusions:  ceFEPime (MAXIPIME) IV 2 g (04/10/22 2154)   methocarbamol (ROBAXIN) IV     PRN Meds: acetaminophen, alum & mag hydroxide-simeth, bisacodyl, bisacodyl, guaiFENesin-dextromethorphan, hydrALAZINE, HYDROmorphone (DILAUDID) injection, labetalol, levalbuterol, magnesium citrate, methocarbamol **OR** methocarbamol (ROBAXIN) IV, metoCLOPramide **OR** metoCLOPramide (REGLAN) injection, metoprolol tartrate, ondansetron **OR** ondansetron (ZOFRAN) IV, oxyCODONE, oxyCODONE, phenol, polyethylene glycol   Vital Signs    Vitals:   04/10/22 1800 04/10/22 1947 04/11/22 0012 04/11/22 0429  BP: 132/70 (!) 114/52 115/60 119/65  Pulse:  94 87 98  Resp: '20 20 17 20  '$ Temp:  98.3 F (36.8 C) 98 F (36.7 C) 98.1 F (36.7 C)  TempSrc:  Oral Oral Oral  SpO2:  90% 95% 95%   Weight:    130.8 kg  Height:        Intake/Output Summary (Last 24 hours) at 04/11/2022 0829 Last data filed at 04/11/2022 0300 Gross per 24 hour  Intake 680 ml  Output 4176 ml  Net -3496 ml      04/11/2022    4:29 AM 04/10/2022    6:26 AM 04/09/2022    5:05 AM  Last 3 Weights  Weight (lbs) 288 lb 6.4 oz 212 lb 15.4 oz 301 lb  Weight (kg) 130.817 kg 96.6 kg 136.533 kg      Telemetry    Atrial fibrillation with rates mostly in the 80s to 90s. Briefly in the 110s. Some PVCs and ventricular couplets noted. - Personally Reviewed  ECG    No new ECG tracing since 04/06/2022. - Personally Reviewed  Physical Exam   GEN: No acute distress.   Neck: No JVD. Cardiac: Irregularly irregular rhythm with normal rate. II/VI systolic murmur heard at right upper sternal border and another soft II/VI systolic murmur heard at apex. No rubs or gallops.  Respiratory: No increased work of breathing. Diminished breath sounds in bilateral bases but no significant wheezes, rhonchi, or rales. GI: Soft, non-distended, and non-tender. MS: 2+ pitting edema of bilateral lower extremities. ACE wrap on left lower extremity. No deformity. Skin: Warm and dry. Neuro:  No focal deficits. Psych: Normal affect. Responds appropriately.  Labs  High Sensitivity Troponin:   Recent Labs  Lab 04/06/22 1253 04/06/22 1451  TROPONINIHS 21* 27*     Chemistry Recent Labs  Lab 04/08/22 0125 04/09/22 1125 04/10/22 0429 04/11/22 0321  NA 129* 134* 135 136  K 4.1 3.7 3.7 3.2*  CL 97* 96* 96* 96*  CO2 '22 27 28 28  '$ GLUCOSE 130* 164* 139* 115*  BUN 51* 41* 34* 34*  CREATININE 1.56* 1.22 1.15 1.01  CALCIUM 9.1 9.3 9.0 9.2  MG 1.9 1.4* 1.7 1.7  ALBUMIN 2.9* 2.7* 2.6*  --   GFRNONAA 45* >60 >60 >60  ANIONGAP '10 11 11 12    '$ Lipids No results for input(s): "CHOL", "TRIG", "HDL", "LABVLDL", "LDLCALC", "CHOLHDL" in the last 168 hours.  Hematology Recent Labs  Lab 04/09/22 1125 04/10/22 0429 04/11/22 0321   WBC 11.9* 15.1* 13.6*  RBC 3.78* 3.77* 3.21*  HGB 11.8* 11.7* 10.2*  HCT 34.3* 34.8* 29.4*  MCV 90.7 92.3 91.6  MCH 31.2 31.0 31.8  MCHC 34.4 33.6 34.7  RDW 14.9 15.1 15.2  PLT 237 252 209   Thyroid No results for input(s): "TSH", "FREET4" in the last 168 hours.  BNP Recent Labs  Lab 04/06/22 1253  BNP 682.5*    DDimer No results for input(s): "DDIMER" in the last 168 hours.   Radiology    No results found.  Cardiac Studies   Echocardiogram 04/07/2022: Impressions: 1. Left ventricular ejection fraction, by estimation, is 50 to 55%. The  left ventricle has mildly decreased function. The left ventricle has no  regional wall motion abnormalities. There is mild concentric left  ventricular hypertrophy. Left ventricular  diastolic function could not be evaluated. There is the interventricular  septum is flattened in systole and diastole, consistent with right  ventricular pressure and volume overload.   2. Right ventricular systolic function is moderately reduced. The right  ventricular size is moderately enlarged. There is severely elevated  pulmonary artery systolic pressure. The estimated right ventricular  systolic pressure is 42.3 mmHg.   3. Left atrial size was severely dilated.   4. Right atrial size was severely dilated.   5. The mitral valve is grossly normal. Severe mitral valve regurgitation.  Moderate mitral annular calcification.   6. The aortic valve is tricuspid. There is severe calcifcation of the  aortic valve. There is severe thickening of the aortic valve. Aortic valve  regurgitation is not visualized. Moderate aortic valve stenosis. Aortic  valve mean gradient measures 17.0  mmHg. Aortic valve Vmax measures 2.71 m/s. Aortic valve acceleration time  measures 120 msec.   7. Aortic dilatation noted. There is mild dilatation of the ascending  aorta, measuring 41 mm. There is mild dilatation of the aortic root,  measuring 39 mm.   8. The inferior vena  cava is dilated in size with <50% respiratory  variability, suggesting right atrial pressure of 15 mmHg.   Comparison(s): Prior images reviewed side by side. The left ventricular  function is worsened. Mitral insufficiency is worse.   Patient Profile     76 y.o. male with a history of CAD s/p STEMI in 2021 s/p DES to LCX, chronic diastolic CHF paroxysmal atrial fibrillation on Eliquis, hypertension, hyperlipidemia, type 2 diabetes mellitus, sleep apnea who presented to [facility] on 04/04/2022 for left leg excision debridement of a chronic abscess. Immediately after intubation, patient became bradycardic and hypotension but no mention of loss of pulses. He underwent resuscitation and responded very well. Decision was made to proceed with procedure and  Cardiology was consulted afterwards for further evaluation.  Assessment & Plan    Episode of Bradycardia and Hypotension Following intubation and induction of anesthesia. Received transient resuscitative efforts in the OR and was able to complete operation. - No recurrent events. BP and HR well controlled. - Tolerating low dose Lopressor 12.'5mg'$  twice daily. Rates are now on the higher side so will increase to '25mg'$  twice daily and monitor closely for bradycardia (was on '50mg'$  twice daily at home).  Acute on Chronic Diastolic CHF BNP elevated at 682. Chest x-ray showed mild stable cardiomegaly with hazy opacification over the right mid to lower lung which may be due to atelectasis or infection as well as possible small right pleural effusion. Echo showed LVEF of 50-55% with no regional wall motion abnormalities, moderately enlarged RV with moderately reduced systolic function, severe biatrial enlargement, severe MR, moderate AS, and severe pulmonary hypertension with RVSP of 85.2 mmHg. Being diuresed with IV Lasix. Documented urinary output of 4.176 mL in the last 24 hours and net negative 19.3 L this admission. Weight down 8 lbs since admission. Renal  function stable.  - Still volume overloaded on exam. - Continue IV Lasix '60mg'$  twice daily. - Driving factor appears to be valvular heart disease. See below.  Demand Ischemia CAD History of STEMI s/p DES to LCX in 2021. High-sensitivity troponin minimally elevated at 21 >> 27 consistent with demand ischemia in setting of acute CHF and brief arrest. - He has some mild chest soreness from brief CPR but no other chest pain. - No aspirin due to need for Eliquis. - Continue beta-blocker and high-intensity statin.  Severe Mitral Regurgitation Moderate Aortic Stenosis Noted on Echo this admission. Severe MR was a new finding. Per MD, transthoracic echo image quality is not great and is difficult to establish the etiology. Differential diagnosis includes endocarditis (possible skin source), ischemic tethering, and even transient myocardial stunning following brief arrest.  - Plan is to repeat TTE later this week before deciding on TEE.  Discussed with MD and will plan to repeat TTE on Friday.  Severe Pulmonary Hypertension Echo this admission showed RVSP of 85.2 mmHg with moderately reduced RV function. Felt to largely be due to left heart disease and probably also due to obstructive sleep apnea. Will likely need R/LHC once more euvolemic and infection has been treated.  Permanent Atrial Fibrillation Rates mostly in the 80s to 90s at rest but briefly in the 110s at times. Telemetry also showed occasional PVC and ventricular couplets. - Currently on Lopressor 12.'5mg'$  twice daily. Will increase to '25mg'$  twice daily as above. - Continue Eliquis '5mg'$  twice daily.  Hyperlipidemia - Continue Lipitor '80mg'$  daily.  Type 2 Diabetes Mellitus - Management per primary team.  Acute on CKD Stage III Creatinine 1.71 on admission and peaked at 1.84. Baseline around 1.2 to 1.5.  - Improving with diuresis. Creatinine 1.01 today. - Continue to monitor.  Hyponatremia - Resolved Sodium as low as 127 on 9/15.  Improved with diuresis. - Normal at 136 today.  Cellulitis Leg Abscess S/p debridement on 9/13. Of note, culture of abscess grew Serratia.  - Management per primary team.     For questions or updates, please contact [place] Please consult [email or web address] for contact info under        Signed, Darreld Mclean, PA-C  04/11/2022, 8:29 AM    I have seen and examined the patient along with Darreld Mclean, PA-C , PA NP.  I have reviewed the chart,  notes and new data.  I agree with PA/NP's note.  Key new complaints: Feels that breathing has improved somewhat.  Reports that he walked twice the distance today, compared to yesterday. Key examination changes: Still has substantial edema of the lower extremities despite massive diuresis.  Well rate controlled atrial fibrillation.  Apical murmur is much less impressive than appearance of MR on echo. Key new findings / data: Normal sodium and creatinine levels, both improved as we continue with diuresis.  PLAN: Continue intravenous diuretics.  Reevaluate mitral insufficiency and pulmonary artery pressure by echo at the end of this week.  If he still has severe MR and/or severe pulmonary hypertension, he will need further work-up with transesophageal echo and right and left heart catheterization, although it may be wisest to wait to perform these procedures after his abscess has healed.  Sanda Klein, MD, [place] [phone] 04/11/2022, 1:04 PM

## 2022-04-11 NOTE — TOC Progression Note (Addendum)
Transition of Care [facility]) - Progression Note    Patient Details  Name: Ryan Garner MRN: [id number] Date of Birth: 10-31-1945  [facility] CM/SW [place], [place] Phone Number: 04/11/2022, 3:24 PM  Clinical Narrative:     CSW met with patient- patient was sitting in chair upon arrival. CSW introduced self and explained role. CSW discussed PT/OT recommendation of short term rehab and SNF vs HH. Patient states understanding of recommendation but confirms his request is to discharge home with [facility]. He reports he will have Thompson Grayer and other family and friends to assit him and his spouse in the home. He states is actual address is [place] [address].  Patient states no questions or concerns at this time.  TOC will continue to follow and assist with discharge planning.   [name], MSW, LCSW Clinical Social Worker     Expected Discharge Plan: [place] Barriers to Discharge: Continued Medical Work up  Expected Discharge Plan and Services Expected Discharge Plan: [place] arrangements for the past 2 months: Single Family Home                                       Social Determinants of Health (SDOH) Interventions    Readmission Risk Interventions     No data to display

## 2022-04-11 NOTE — Progress Notes (Signed)
PHARMACY NOTE:  ANTIMICROBIAL RENAL DOSAGE ADJUSTMENT  Current antimicrobial regimen includes a mismatch between antimicrobial dosage and estimated renal function.  As per policy approved by the Pharmacy & Therapeutics and Medical Executive Committees, the antimicrobial dosage will be adjusted accordingly.  Current antimicrobial dosage:  Cefepime 2g IV Q12H   Indication: Leg abscess   Renal Function:  Estimated Creatinine Clearance: 79.6 mL/min (by C-G formula based on SCr of 1.01 mg/dL). '[]'$      On intermittent HD, scheduled: '[]'$      On CRRT    Antimicrobial dosage has been changed to:  Cefepime 2g IV Q8H   Thank you for allowing pharmacy to be a part of this patient's care.  Adria Dill, PharmD PGY-2 Infectious Diseases Resident  04/11/2022 7:24 AM

## 2022-04-11 NOTE — Progress Notes (Signed)
Patient ID: Ryan Garner, male   DOB: 1946-02-27, 76 y.o.   MRN: [id number] Patient status post debridement left leg abscess.  Approximately 400 cc in the wound VAC canister.  Patient is still diuresing.  We will have the wound VAC discontinued at time of discharge.

## 2022-04-11 NOTE — Progress Notes (Signed)
Occupational Therapy Treatment Patient Details Name: Ryan Garner MRN: [id number] DOB: [birth year] Today's Date: 04/11/2022   History of present illness 76 yo male admitted with chronic abscess of Lt tibia due to an injury about a year ago when his leg was pinned between a bobcat blade and a truck. Pt s/p LLE I&D 9/13 and 9/15 with VAC. 9/15 during intubation pt with bradycardia and hypotension.  PMH: afib, CHF,  DM, HLD, HTN, STEMI, rotator cuff repair   OT comments  Pt progressing towards goals, able to ambulate hallway distance using EVA walker with min -mod A for transfers. Pt completed seated ADLs upon arrival with set up A, min A for UB dressing. Pt needing x1 standing rest break, with 2/4 DOE. Pt SpO2 with poor wave pleth reading 82% after walking, checked with pulse ox reading 96% on RA. Pt presenting with impairments listed below, will follow acutely. Continue to recommend SNF at d/c.   Recommendations for follow up therapy are one component of a multi-disciplinary discharge planning process, led by the attending physician.  Recommendations may be updated based on patient status, additional functional criteria and insurance authorization.    Follow Up Recommendations  Skilled nursing-short term rehab (<3 hours/day)    Assistance Recommended at Discharge Intermittent Supervision/Assistance  Patient can return home with the following  A little help with bathing/dressing/bathroom;A little help with walking and/or transfers   Equipment Recommendations  None recommended by OT (defer)    Recommendations for Other Services PT consult    Precautions / Restrictions Precautions Precautions: Fall Precaution Comments: wound vac LLE Restrictions Weight Bearing Restrictions: Yes LLE Weight Bearing: Weight bearing as tolerated       Mobility Bed Mobility               General bed mobility comments: up in recliner upon arrival    Transfers Overall transfer level: Needs  assistance Equipment used: Bilateral platform walker (eva walker) Transfers: Sit to/from Stand Sit to Stand: Min assist, Mod assist                 Balance Overall balance assessment: Needs assistance Sitting-balance support: No upper extremity supported, Feet supported Sitting balance-Leahy Scale: Fair     Standing balance support: Reliant on assistive device for balance, Bilateral upper extremity supported Standing balance-Leahy Scale: Poor Standing balance comment: EVA walker in standing                           ADL either performed or assessed with clinical judgement   ADL Overall ADL's : Needs assistance/impaired     Grooming: Oral care;Set up;Sitting Grooming Details (indicate cue type and reason): shaving seated in chair upon arrival         Upper Body Dressing : Minimal assistance Upper Body Dressing Details (indicate cue type and reason): donning gown     Toilet Transfer: Minimal assistance;Ambulation;Regular Toilet (eva walker) Toilet Transfer Details (indicate cue type and reason): min A with [place]on Pier walker         Functional mobility during ADLs: Minimal assistance (eva walker)      Extremity/Trunk Assessment Upper Extremity Assessment Upper Extremity Assessment: Overall WFL for tasks assessed   Lower Extremity Assessment Lower Extremity Assessment: Defer to PT evaluation        Vision   Vision Assessment?: No apparent visual deficits   Perception Perception Perception: Not tested   Praxis Praxis Praxis: Not tested    Cognition Arousal/Alertness: Awake/alert  Behavior During Therapy: Flat affect Overall Cognitive Status: Within Functional Limits for tasks assessed                                 General Comments: Pt A and O x 4. Pt cooperative.        Exercises      Shoulder Instructions       General Comments SpO2 with poor wave form reading 83%, checked with pulse ox, reading 96% after ambulation     Pertinent Vitals/ Pain       Pain Assessment Pain Assessment: Faces Pain Score: 2  Faces Pain Scale: Hurts a little bit Pain Location: general Pain Descriptors / Indicators: Sore Pain Intervention(s): Limited activity within patient's tolerance, Monitored during session, Repositioned  Home Living                                          Prior Functioning/Environment              Frequency  Min 2X/week        Progress Toward Goals  OT Goals(current goals can now be found in the care plan section)  Progress towards OT goals: Progressing toward goals  Acute Rehab OT Goals Patient Stated Goal: to walk OT Goal Formulation: With patient Time For Goal Achievement: 04/19/22 Potential to Achieve Goals: Good ADL Goals Pt Will Perform Lower Body Bathing: with modified independence;sit to/from stand Pt Will Perform Lower Body Dressing: with modified independence;sit to/from stand Pt Will Transfer to Toilet: with modified independence;ambulating Pt Will Perform Tub/Shower Transfer: with modified independence;ambulating Additional ADL Goal #1: Pt will tolerate 15 mins of standing tasks to be able to complete meal preperations at home level  Plan Discharge plan remains appropriate;Frequency remains appropriate    Co-evaluation                 AM-PAC OT "6 Clicks" Daily Activity     Outcome Measure   Help from another person eating meals?: None Help from another person taking care of personal grooming?: A Little Help from another person toileting, which includes using toliet, bedpan, or urinal?: A Lot Help from another person bathing (including washing, rinsing, drying)?: A Lot Help from another person to put on and taking off regular upper body clothing?: A Little Help from another person to put on and taking off regular lower body clothing?: A Lot 6 Click Score: 16    End of Session Equipment Utilized During Treatment: Other (comment) (eva  walker)  OT Visit Diagnosis: Unsteadiness on feet (R26.81);Other abnormalities of gait and mobility (R26.89);Muscle weakness (generalized) (M62.81);Pain Pain - Right/Left: Left Pain - part of body: Leg   Activity Tolerance Patient tolerated treatment well   Patient Left Other (comment);with call bell/phone within reach (on [facility])   Nurse Communication Mobility status;Other (comment) (notified pt on BSC, wants to sit there for a while, notified to press call button when finished)        Time: 1601-0932 OT Time Calculation (min): 34 min  Charges: OT General Charges $OT Visit: 1 Visit OT Treatments $Self Care/Home Management : 8-22 mins $Therapeutic Activity: 8-22 mins  Lynnda Child, OTD, OTR/L Acute Rehab [phone]) [phone] - [place] 04/11/2022, 11:19 AM

## 2022-04-11 NOTE — Progress Notes (Signed)
PROGRESS NOTE    Ryan Garner  VZD:[id number] DOB: [birth year] DOA: 04/04/2022 PCP: [name], [name], MD  No chief complaint on file.   Brief Narrative:  Patient 76 year old gentleman history of hypertension, hyperlipidemia, CAD with history of STEMI status post PCI, PAH, rate controlled Cisco Kindt-fib on anticoagulation, type 2 diabetes, morbid obesity who was admitted to orthopedic service, per Dr. Sharol Given for left leg cellulitis and abscess.  Patient recently hospitalized from 8/10 to 8/14 for recurrent cellulitis of the left leg at [facility] and failed outpatient management with Augmentin.  Patient noted to have been discharged on Doxy following hospitalization wound got worse with redness swelling and pain seen by PCP placed on cefdinir and subsequently transition to Bactrim.  Despite oral antibiotics wound worsening patient developed an abscess seen in the office by Dr. Sharol Given on 911, admitted placed on IV Vanco Flagyl and Rocephin taken to the OR on 04/06/2022.  During induction for intubation patient became bradycardic hypotensive received CPR temporary placed on pressors stabilized and he proceeded with debridement surgery.  Postoperatively patient noted to have been weaned off pressors extubated successfully.  Patient with some substernal chest discomfort.  Chest x-ray noted hazy opacification of the right mid to lower lung with possible right-sided effusion.  Hospitalist consulted for hypotension and bradycardia.  Cardiology consulted.  ID consulted and following.    Assessment & Plan:   Principal Problem:   Abscess of leg, left Active Problems:   Atrial fibrillation (HCC)   Transient hypotension   Acute kidney injury superimposed on chronic kidney disease (HCC)   Hyponatremia   Bradycardia   (HFpEF) heart failure with preserved ejection fraction (HCC)   Hypotension   Nonrheumatic mitral (valve) insufficiency   Aortic valve stenosis, nonrheumatic   Assessment and Plan: #1 left lower  extremity cellulitis with abscess -Status post I and D per Dr. Sharol Given 04/06/2022 with placement of wound VAC. -Initial cultures from left leg debridement 04/04/2022 per ID growing rare Serratia marcescens. -MRSA PCR negative. -abx narrowed to doxycycline and levaquin per ID  -Per primary team,/orthopedics, Dr. Sharol Given and per ID.     2.  Leukocytosis -Likely secondary to problem #1. -Continue IV antibiotics as directed by ID.   3.  Transient hypotension and bradycardia -Patient noted to be hypotensive and bradycardic for which CPR had to be started after patient given induction medications for intubation. -Patient extubated postprocedure, vasopressin weaned off. -Patient with complaints of chest soreness. -EKG with no significant changes. -Hypotension bradycardia likely secondary to induction of intubation. -Noted to have been on metoprolol 50 mg the morning prior to procedure. -Cardiac enzymes slightly elevated but flat. -Cardiology following, currently on metoprolol 25 mg BID   4.  Acute respiratory failure secondary to acute on chronic diastolic CHF -Patient with volume overload on examination with 3-4+ bilateral pitting lower extremity edema, crackles noted on examination. -echo with EF 50-55%, no RWMA, mild concentric LVH, IV septum flattened in systole and diastole, RVSF moderately reduced, severe MV regurgitation, moderate AV stenosis, IVC dilated with <50% resp variability -Patient noted to be on Lasix 80 mg orally.82.5. -continue lasix 60 mg IV BID per cards - s/p albumin -net negative 21 L, weights appear unreliable  -Per cardiology, appreciate assistance   5.  Severe MVR/moderate AAS/PAH -Cardiology following few patient may need Ulys Favia TEE to evaluate mitral valve in more detail, cardiology recommended Cuma Polyakov limited TTE to reassess later on this week before deciding on TEE this week for further evaluation. -Per cardiology.  6.  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 -improved renal function today -Renal ultrasound with bilateral renal cortical thinning consistent with chronic medical renal disease, no hydronephrosis -brisk UOP, >4 L -Patient was of urinary retention per RN on 04/07/2022 -- Foley catheter ordered and placed. -consider trial of void within next few days  7.  Normocytic anemia Trend, relatively stable   8.  Hypokalemia Follow closely    9.  Hyponatremia Improved, follow   10.  Hypomagnesemia Wnl, follow     DVT prophylaxis: eliquis Code Status: full Family Communication: none Disposition:   Status is: Inpatient Remains inpatient appropriate because: continued need for inpatient care   Consultants:  Cardiology ID Dr. Sharol Given, orthopedics is primary  Procedures:  Echo IMPRESSIONS     1. Left ventricular ejection fraction, by estimation, is 50 to 55%. The  left ventricle has mildly decreased function. The left ventricle has no  regional wall motion abnormalities. There is mild concentric left  ventricular hypertrophy. Left ventricular  diastolic function could not be evaluated. There is the interventricular  septum is flattened in systole and diastole, consistent with right  ventricular pressure and volume overload.   2. Right ventricular systolic function is moderately reduced. The right  ventricular size is moderately enlarged. There is severely elevated  pulmonary artery systolic pressure. The estimated right ventricular  systolic pressure is 86.7 mmHg.   3. Left atrial size was severely dilated.   4. Right atrial size was severely dilated.   5. The mitral valve is grossly normal. Severe mitral valve regurgitation.  Moderate mitral annular calcification.   6. The aortic valve is tricuspid. There is severe calcifcation of the  aortic valve. There is severe thickening  of the aortic valve. Aortic valve  regurgitation is not visualized. Moderate aortic valve stenosis. Aortic  valve mean gradient measures 17.0  mmHg. Aortic valve Vmax measures 2.71 m/s. Aortic valve acceleration time  measures 120 msec.   7. Aortic dilatation noted. There is mild dilatation of the ascending  aorta, measuring 41 mm. There is mild dilatation of the aortic root,  measuring 39 mm.   8. The inferior vena cava is dilated in size with <50% respiratory  variability, suggesting right atrial pressure of 15 mmHg.   Comparison(s): Prior images reviewed side by side. The left ventricular  function is worsened. Mitral insufficiency is worse.   Antimicrobials:  Anti-infectives (From admission, onward)    Start     Dose/Rate Route Frequency Ordered Stop   04/11/22 2200  levofloxacin (LEVAQUIN) tablet 750 mg        750 mg Oral Daily at bedtime 04/11/22 1022 04/27/22 2359   04/11/22 2200  doxycycline (VIBRA-TABS) tablet 100 mg        100 mg Oral Every 12 hours 04/11/22 1321 04/27/22 2359   04/10/22 2200  ceFEPIme (MAXIPIME) 2 g in sodium chloride 0.9 % 100 mL IVPB  Status:  Discontinued        2 g 200 mL/hr over 30 Minutes Intravenous Every 12 hours 04/10/22 1215 04/11/22 1022   04/09/22 2200  amoxicillin-clavulanate (AUGMENTIN) 875-125 MG per tablet 1 tablet  Status:  Discontinued        1 tablet Oral Every 12 hours 04/09/22 1229 04/09/22 1615   04/09/22 2200  doxycycline (VIBRA-TABS) tablet 100 mg  Status:  Discontinued  100 mg Oral Every 12 hours 04/09/22 1229 04/11/22 1022   04/09/22 2200  ceFEPIme (MAXIPIME) 2 g in sodium chloride 0.9 % 100 mL IVPB  Status:  Discontinued        2 g 200 mL/hr over 30 Minutes Intravenous Every 8 hours 04/09/22 1615 04/10/22 1215   04/06/22 0845  ceFAZolin (ANCEF) IVPB 3g/100 mL premix  Status:  Discontinued        3 g 200 mL/hr over 30 Minutes Intravenous On call to O.R. 04/06/22 0839 04/06/22 1153   04/06/22 0600  vancomycin (VANCOREADY)  IVPB 1750 mg/350 mL  Status:  Discontinued        1,750 mg 175 mL/hr over 120 Minutes Intravenous Every 36 hours 04/04/22 1602 04/05/22 1121   04/05/22 2000  metroNIDAZOLE (FLAGYL) tablet 500 mg  Status:  Discontinued        500 mg Oral Every 12 hours 04/05/22 1021 04/09/22 1229   04/05/22 1630  vancomycin (VANCOCIN) IVPB 1000 mg/200 mL premix  Status:  Discontinued        1,000 mg 200 mL/hr over 60 Minutes Intravenous Every 24 hours 04/05/22 1121 04/09/22 1229   04/04/22 1400  cefTRIAXone (ROCEPHIN) 2 g in sodium chloride 0.9 % 100 mL IVPB  Status:  Discontinued        2 g 200 mL/hr over 30 Minutes Intravenous Every 24 hours 04/04/22 1218 04/09/22 1229   04/04/22 1400  metroNIDAZOLE (FLAGYL) IVPB 500 mg  Status:  Discontinued        500 mg 100 mL/hr over 60 Minutes Intravenous Every 8 hours 04/04/22 1218 04/05/22 1021   04/04/22 1400  vancomycin (VANCOCIN) 2,500 mg in sodium chloride 0.9 % 500 mL IVPB        2,500 mg 262.5 mL/hr over 120 Minutes Intravenous  Once 04/04/22 1312 04/04/22 1800   04/04/22 0800  ceFAZolin (ANCEF) IVPB 3g/100 mL premix        3 g 200 mL/hr over 30 Minutes Intravenous On call to O.R. 04/04/22 0746 04/04/22 1002       Subjective: No complaints today  Objective: Vitals:   04/11/22 0012 04/11/22 0429 04/11/22 0800 04/11/22 1145  BP: 115/60 119/65  121/86  Pulse: 87 98  98  Resp: '17 20 16 17  '$ Temp: 98 F (36.7 C) 98.1 F (36.7 C) 98.5 F (36.9 C) 98.2 F (36.8 C)  TempSrc: Oral Oral Oral Oral  SpO2: 95% 95%  98%  Weight:  130.8 kg    Height:        Intake/Output Summary (Last 24 hours) at 04/11/2022 1629 Last data filed at 04/11/2022 1500 Gross per 24 hour  Intake 100 ml  Output 4625 ml  Net -4525 ml   Filed Weights   04/09/22 0505 04/10/22 0626 04/11/22 0429  Weight: (!) 136.5 kg 96.6 kg 130.8 kg    Examination:  General exam: Appears calm and comfortable  Respiratory system: unlabored Cardiovascular system: RRR Gastrointestinal  system: Abdomen is nondistended, soft and nontender. Central nervous system: Alert and oriented. No focal neurological deficits. Extremities: RLE edema, LLE with dressing, wound vac   Data Reviewed: I have personally reviewed following labs and imaging studies  CBC: Recent Labs  Lab 04/07/22 0223 04/08/22 0125 04/09/22 1125 04/10/22 0429 04/11/22 0321  WBC 12.4* 10.9* 11.9* 15.1* 13.6*  NEUTROABS  --  8.2* 9.7* 12.5* 10.2*  HGB 11.3* 10.9* 11.8* 11.7* 10.2*  HCT 33.3* 32.5* 34.3* 34.8* 29.4*  MCV 92.2 92.9 90.7 92.3 91.6  PLT  225 200 237 252 778    Basic Metabolic Panel: Recent Labs  Lab 04/07/22 0223 04/08/22 0125 04/09/22 1125 04/10/22 0429 04/11/22 0321  NA 129* 129* 134* 135 136  K 5.0 4.1 3.7 3.7 3.2*  CL 92* 97* 96* 96* 96*  CO2 '24 22 27 28 28  '$ GLUCOSE 154* 130* 164* 139* 115*  BUN 52* 51* 41* 34* 34*  CREATININE 1.84* 1.56* 1.22 1.15 1.01  CALCIUM 9.1 9.1 9.3 9.0 9.2  MG 2.0 1.9 1.4* 1.7 1.7  PHOS  --  3.5 3.1 2.9  --     GFR: Estimated Creatinine Clearance: 79.6 mL/min (by C-G formula based on SCr of 1.01 mg/dL).  Liver Function Tests: Recent Labs  Lab 04/08/22 0125 04/09/22 1125 04/10/22 0429  ALBUMIN 2.9* 2.7* 2.6*    CBG: Recent Labs  Lab 04/06/22 0813 04/06/22 1047 04/06/22 1207  GLUCAP 130* 121* 119*     Recent Results (from the past 240 hour(s))  Aerobic/Anaerobic Culture w Gram Stain (surgical/deep wound)     Status: None   Collection Time: 04/04/22  9:44 AM   Specimen: PATH Soft tissue  Result Value Ref Range Status   Specimen Description LEG  Final   Special Requests  LEFT DEBRIDEMENT  Final   Gram Stain NO WBC SEEN NO ORGANISMS SEEN   Final   Culture   Final    RARE SERRATIA MARCESCENS NO ANAEROBES ISOLATED Performed at [facility], [place] [address], [place], [place] [address]    Report Status 04/11/2022 FINAL  Final   Organism ID, Bacteria SERRATIA MARCESCENS  Final      Susceptibility   Serratia marcescens -  MIC*    CEFAZOLIN >=64 RESISTANT Resistant     CEFEPIME <=0.12 SENSITIVE Sensitive     CEFTAZIDIME <=1 SENSITIVE Sensitive     CEFTRIAXONE <=0.25 SENSITIVE Sensitive     CIPROFLOXACIN <=0.25 SENSITIVE Sensitive     GENTAMICIN <=1 SENSITIVE Sensitive     TRIMETH/SULFA <=20 SENSITIVE Sensitive     * RARE SERRATIA MARCESCENS  Surgical pcr screen     Status: None   Collection Time: 04/05/22  8:11 PM   Specimen: Nasal Mucosa; Nasal Swab  Result Value Ref Range Status   MRSA, PCR NEGATIVE NEGATIVE Final   Staphylococcus aureus NEGATIVE NEGATIVE Final    Comment: (NOTE) The Xpert SA Assay (FDA approved for NASAL specimens in patients 23 years of age and older), is one component of Temica Righetti comprehensive surveillance program. It is not intended to diagnose infection nor to guide or monitor treatment. Performed at [facility], [place] [address], [place], [place] [address]   MRSA Next Gen by PCR, Nasal     Status: None   Collection Time: 04/06/22 12:25 PM   Specimen: Nasal Mucosa; Nasal Swab  Result Value Ref Range Status   MRSA by PCR Next Gen NOT DETECTED NOT DETECTED Final    Comment: (NOTE) The GeneXpert MRSA Assay (FDA approved for NASAL specimens only), is one component of Rickelle Sylvestre comprehensive MRSA colonization surveillance program. It is not intended to diagnose MRSA infection nor to guide or monitor treatment for MRSA infections. Test performance is not FDA approved in patients less than 32 years old. Performed at [facility], [place] [address] [place], [place] [address]          Radiology Studies: No results found.      Scheduled Meds:  apixaban  5 mg Oral BID   vitamin C  1,000  mg Oral Daily   atorvastatin  80 mg Oral Daily   Chlorhexidine Gluconate Cloth  6 each Topical Daily   clopidogrel  75 mg Oral Q breakfast   docusate sodium  100 mg Oral BID   doxycycline  100 mg Oral Q12H   furosemide  60 mg Intravenous BID   levofloxacin  750 mg Oral QHS   metoprolol  tartrate  25 mg Oral BID   nutrition supplement (JUVEN)  1 packet Oral BID BM   pantoprazole  40 mg Oral Daily   potassium chloride  20 mEq Oral Once per day on Sun Tue Thu Sat   And   potassium chloride  40 mEq Oral Q M,W,F   zinc sulfate  220 mg Oral Daily   Continuous Infusions:  methocarbamol (ROBAXIN) IV       LOS: 7 days    Time spent: over 30 min    Fayrene Helper, MD [facility]   To contact the attending provider between 7A-7P or the covering provider during after hours 7P-7A, please log into the web site [email or web address] and access using universal [place] password for that web site. If you do not have the password, please call the hospital operator.  04/11/2022, 4:29 PM

## 2022-04-11 NOTE — Progress Notes (Signed)
Physical Therapy Treatment Patient Details Name: Ryan Garner MRN: [id number] DOB: [birth year] Today's Date: 04/11/2022   History of Present Illness 76 yo male admitted with chronic abscess of Lt tibia due to an injury about a year ago when his leg was pinned between a bobcat blade and a truck. Pt s/p LLE I&D 9/13 and 9/15 with VAC. 9/15 during intubation pt with bradycardia and hypotension.  PMH: afib, CHF,  DM, HLD, HTN, STEMI, rotator cuff repair    PT Comments    Pt received OOB in recliner on arrival and agreeable to session with continued progress toward acute goals. Session focused on continued gait and transfer training with pt mobilizing with min guard with EVA walker for increased distance. Cues throughout gait for upright posture and forward gaze as well as foot clearance as pt with tendency for low shuffling steps. Pt receptive to eduction re; activity recommendations and importance of continued mobility. Pt continues to benefit from skilled PT services to progress toward functional mobility goals.    Recommendations for follow up therapy are one component of a multi-disciplinary discharge planning process, led by the attending physician.  Recommendations may be updated based on patient status, additional functional criteria and insurance authorization.  Follow Up Recommendations  Skilled nursing-short term rehab (<3 hours/day) Can patient physically be transported by private vehicle: No   Assistance Recommended at Discharge Frequent or constant Supervision/Assistance  Patient can return home with the following A lot of help with walking and/or transfers;A lot of help with bathing/dressing/bathroom;Assistance with cooking/housework;Assist for transportation;Help with stairs or ramp for entrance   Equipment Recommendations  Rolling walker (2 wheels);BSC/3in1    Recommendations for Other Services       Precautions / Restrictions Precautions Precautions: Fall Precaution  Comments: wound vac LLE Restrictions Weight Bearing Restrictions: Yes LLE Weight Bearing: Weight bearing as tolerated     Mobility  Bed Mobility               General bed mobility comments: up in recliner upon arrival    Transfers Overall transfer level: Needs assistance Equipment used: Bilateral platform walker (eva walker) Transfers: Sit to/from Stand Sit to Stand: Min assist           General transfer comment: min a to power up to standing    Ambulation/Gait Ambulation/Gait assistance: Min assist Gait Distance (Feet): 167 Feet Assistive device: Ethelene Hal Gait Pattern/deviations: Step-through pattern, Decreased stride length, Trunk flexed, Antalgic Gait velocity: decreased     General Gait Details: slow gati with EVA walker, cues for upright posture and forward gaze, pt shuffling feet along floor, able to incrase foot clearance with cues   Stairs             Wheelchair Mobility    Modified Rankin (Stroke Patients Only)       Balance Overall balance assessment: Needs assistance Sitting-balance support: No upper extremity supported, Feet supported Sitting balance-Leahy Scale: Fair     Standing balance support: Reliant on assistive device for balance, Bilateral upper extremity supported Standing balance-Leahy Scale: Poor Standing balance comment: EVA walker in standing                            Cognition Arousal/Alertness: Awake/alert Behavior During Therapy: Flat affect Overall Cognitive Status: Within Functional Limits for tasks assessed  General Comments: Pt A and O x 4. Pt cooperative.        Exercises      General Comments General comments (skin integrity, edema, etc.): SpO2 >96% throughout      Pertinent Vitals/Pain Pain Assessment Pain Assessment: Faces Faces Pain Scale: Hurts a little bit Pain Location: general Pain Descriptors / Indicators: Sore Pain  Intervention(s): Monitored during session, Limited activity within patient's tolerance    Home Living                          Prior Function            PT Goals (current goals can now be found in the care plan section) Acute Rehab PT Goals PT Goal Formulation: With patient Time For Goal Achievement: 04/19/22    Frequency    Min 3X/week      PT Plan Current plan remains appropriate    Co-evaluation              AM-PAC PT "6 Clicks" Mobility   Outcome Measure  Help needed turning from your back to your side while in a flat bed without using bedrails?: A Lot Help needed moving from lying on your back to sitting on the side of a flat bed without using bedrails?: A Lot Help needed moving to and from a bed to a chair (including a wheelchair)?: A Lot Help needed standing up from a chair using your arms (e.g., wheelchair or bedside chair)?: A Lot Help needed to walk in hospital room?: A Lot Help needed climbing 3-5 steps with a railing? : Total 6 Click Score: 11    End of Session   Activity Tolerance: Patient tolerated treatment well Patient left: with call bell/phone within reach;Other (comment) (on BSC, pt aware to use call bell when done) Nurse Communication: Mobility status PT Visit Diagnosis: Other abnormalities of gait and mobility (R26.89);Pain;Muscle weakness (generalized) (M62.81) Pain - Right/Left: Left Pain - part of body: Leg     Time: 5726-2035 PT Time Calculation (min) (ACUTE ONLY): 24 min  Charges:  $Gait Training: 23-37 mins                     Robynne Roat R. PTA Acute Rehabilitation Services Office: [place] 04/11/2022, 4:16 PM

## 2022-04-12 ENCOUNTER — Inpatient Hospital Stay (HOSPITAL_COMMUNITY): Payer: Medicare Other

## 2022-04-12 DIAGNOSIS — L02416 Cutaneous abscess of left lower limb: Secondary | ICD-10-CM | POA: Diagnosis not present

## 2022-04-12 DIAGNOSIS — I340 Nonrheumatic mitral (valve) insufficiency: Secondary | ICD-10-CM

## 2022-04-12 DIAGNOSIS — I5033 Acute on chronic diastolic (congestive) heart failure: Secondary | ICD-10-CM | POA: Diagnosis not present

## 2022-04-12 DIAGNOSIS — I350 Nonrheumatic aortic (valve) stenosis: Secondary | ICD-10-CM | POA: Diagnosis not present

## 2022-04-12 LAB — COMPREHENSIVE METABOLIC PANEL
ALT: 10 U/L (ref 0–44)
AST: 16 U/L (ref 15–41)
Albumin: 2.6 g/dL — ABNORMAL LOW (ref 3.5–5.0)
Alkaline Phosphatase: 55 U/L (ref 38–126)
Anion gap: 8 (ref 5–15)
BUN: 33 mg/dL — ABNORMAL HIGH (ref 8–23)
CO2: 31 mmol/L (ref 22–32)
Calcium: 8.7 mg/dL — ABNORMAL LOW (ref 8.9–10.3)
Chloride: 94 mmol/L — ABNORMAL LOW (ref 98–111)
Creatinine, Ser: 1.06 mg/dL (ref 0.61–1.24)
GFR, Estimated: >60 mL/min (ref >60)
Glucose, Bld: 123 mg/dL — ABNORMAL HIGH (ref 70–99)
Potassium: 3.1 mmol/L — ABNORMAL LOW (ref 3.5–5.1)
Sodium: 133 mmol/L — ABNORMAL LOW (ref 135–145)
Total Bilirubin: 1.4 mg/dL — ABNORMAL HIGH (ref 0.3–1.2)
Total Protein: 5.6 g/dL — ABNORMAL LOW (ref 6.5–8.1)

## 2022-04-12 LAB — ECHOCARDIOGRAM LIMITED
Height: 68 in
MV M vel: 4.64 m/s
MV Peak grad: 86.1 mmHg
Radius: 0.5 cm
Weight: 4657.6 oz

## 2022-04-12 LAB — CBC
HCT: 30.6 % — ABNORMAL LOW (ref 39.0–52.0)
Hemoglobin: 10.4 g/dL — ABNORMAL LOW (ref 13.0–17.0)
MCH: 31.2 pg (ref 26.0–34.0)
MCHC: 34 g/dL (ref 30.0–36.0)
MCV: 91.9 fL (ref 80.0–100.0)
Platelets: 217 10*3/uL (ref 150–400)
RBC: 3.33 MIL/uL — ABNORMAL LOW (ref 4.22–5.81)
RDW: 15 % (ref 11.5–15.5)
WBC: 11.6 10*3/uL — ABNORMAL HIGH (ref 4.0–10.5)
nRBC: 0 % (ref 0.0–0.2)

## 2022-04-12 MED ORDER — FUROSEMIDE 40 MG PO TABS
80.0000 mg | ORAL_TABLET | Freq: Two times a day (BID) | ORAL | Status: DC
  Administered 2022-04-12 – 2022-04-14 (×4): 80 mg via ORAL
  Filled 2022-04-12 (×4): qty 2

## 2022-04-12 MED ORDER — POTASSIUM CHLORIDE CRYS ER 20 MEQ PO TBCR
20.0000 meq | EXTENDED_RELEASE_TABLET | Freq: Three times a day (TID) | ORAL | Status: DC
  Administered 2022-04-12 – 2022-04-14 (×6): 20 meq via ORAL
  Filled 2022-04-12 (×6): qty 1

## 2022-04-12 NOTE — Progress Notes (Signed)
PROGRESS NOTE    CREEK GAN  LOV:[id number] DOB: Jun 20, 1946 DOA: 04/04/2022 PCP: [name], [name], MD  No chief complaint on file.   Brief Narrative:  Patient 76 year old gentleman history of hypertension, hyperlipidemia, CAD with history of STEMI status post PCI, PAH, rate controlled Alaycia Eardley-fib on anticoagulation, type 2 diabetes, morbid obesity who was admitted to orthopedic service, per Dr. Sharol Given for left leg cellulitis and abscess.  Patient recently hospitalized from 8/10 to 8/14 for recurrent cellulitis of the left leg at [facility] and failed outpatient management with Augmentin.  Patient noted to have been discharged on Doxy following hospitalization wound got worse with redness swelling and pain seen by PCP placed on cefdinir and subsequently transition to Bactrim.  Despite oral antibiotics wound worsening patient developed an abscess seen in the office by Dr. Sharol Given on 911, admitted placed on IV Vanco Flagyl and Rocephin taken to the OR on 04/06/2022.  During induction for intubation patient became bradycardic hypotensive received CPR temporary placed on pressors stabilized and he proceeded with debridement surgery.  Postoperatively patient noted to have been weaned off pressors extubated successfully.  Patient with some substernal chest discomfort.  Chest x-ray noted hazy opacification of the right mid to lower lung with possible right-sided effusion.  Hospitalist consulted for hypotension and bradycardia.  Cardiology consulted.  ID consulted and following.    Assessment & Plan:   Principal Problem:   Abscess of leg, left Active Problems:   Atrial fibrillation (HCC)   Transient hypotension   Acute kidney injury superimposed on chronic kidney disease (HCC)   Hyponatremia   Bradycardia   (HFpEF) heart failure with preserved ejection fraction (HCC)   Hypotension   Nonrheumatic mitral (valve) insufficiency   Aortic valve stenosis, nonrheumatic   Assessment and Plan: #1 left lower  extremity cellulitis with abscess -Status post I and D per Dr. Sharol Given 04/06/2022 with placement of wound VAC. -Initial cultures from left leg debridement 04/04/2022 per ID growing rare Serratia marcescens. -MRSA PCR negative. -abx narrowed to doxycycline and levaquin per ID  -Per primary team,/orthopedics, Dr. Sharol Given and per ID.     2.  Leukocytosis -Likely secondary to problem #1. -Continue IV antibiotics as directed by ID.   3.  Transient hypotension and bradycardia -Patient noted to be hypotensive and bradycardic for which CPR had to be started after patient given induction medications for intubation. -Patient extubated postprocedure, vasopressin weaned off. -Patient with complaints of chest soreness. -EKG with no significant changes. -Hypotension bradycardia likely secondary to induction of intubation. -Noted to have been on metoprolol 50 mg the morning prior to procedure. -Cardiac enzymes slightly elevated but flat. -Cardiology following, currently on metoprolol 25 mg BID   4.  Acute respiratory failure secondary to acute on chronic diastolic CHF -Patient with volume overload on examination with 3-4+ bilateral pitting lower extremity edema, crackles noted on examination. -echo with EF 50-55%, no RWMA, mild concentric LVH, IV septum flattened in systole and diastole, RVSF moderately reduced, severe MV regurgitation, moderate AV stenosis, IVC dilated with <50% resp variability -repeat echo 9/21 with severe MVR -Patient noted to be on Lasix 80 mg orally.82.5. -continue lasix 60 mg IV BID per cards -> planning to transition to PO as able -net negative 21 L, weights appear unreliable  -Per cardiology, appreciate assistance   5.  Severe MVR/moderate AAS/PAH -repeat TTE shows severe MVR, he'll need TEE, repeat right and L heart cath to [facility] severity and etiology -Per cardiology   6.  AKI superimposed on  CKD stage IIIa  urinary retention -Creatinine noted to be trending up to 1.83 with Bronte Kropf  BUN of 47 early on in the hospitalization, prior creatinine noted at 1.2 on 03/03/2022. -improved renal function today -Renal ultrasound with bilateral renal cortical thinning consistent with chronic medical renal disease, no hydronephrosis -brisk UOP, >4 L -Patient was of urinary retention per RN on 04/07/2022 -- Foley catheter ordered and placed. -consider trial of void within next few days  7.  Normocytic anemia Trend, relatively stable   8.  Hypokalemia Follow closely    9.  Hyponatremia Improved, follow   10.  Hypomagnesemia Wnl, follow     DVT prophylaxis: eliquis Code Status: full Family Communication: none Disposition:   Status is: Inpatient Remains inpatient appropriate because: continued need for inpatient care   Consultants:  Cardiology ID Dr. Sharol Given, orthopedics is primary  Procedures:  Echo IMPRESSIONS     1. Left ventricular ejection fraction, by estimation, is 50 to 55%. The  left ventricle has mildly decreased function. The left ventricle has no  regional wall motion abnormalities. There is mild concentric left  ventricular hypertrophy. Left ventricular  diastolic function could not be evaluated. There is the interventricular  septum is flattened in systole and diastole, consistent with right  ventricular pressure and volume overload.   2. Right ventricular systolic function is moderately reduced. The right  ventricular size is moderately enlarged. There is severely elevated  pulmonary artery systolic pressure. The estimated right ventricular  systolic pressure is 77.8 mmHg.   3. Left atrial size was severely dilated.   4. Right atrial size was severely dilated.   5. The mitral valve is grossly normal. Severe mitral valve regurgitation.  Moderate mitral annular calcification.   6. The aortic valve is tricuspid. There is severe calcifcation of the  aortic valve. There is severe thickening of the aortic valve. Aortic valve  regurgitation is not  visualized. Moderate aortic valve stenosis. Aortic  valve mean gradient measures 17.0  mmHg. Aortic valve Vmax measures 2.71 m/s. Aortic valve acceleration time  measures 120 msec.   7. Aortic dilatation noted. There is mild dilatation of the ascending  aorta, measuring 41 mm. There is mild dilatation of the aortic root,  measuring 39 mm.   8. The inferior vena cava is dilated in size with <50% respiratory  variability, suggesting right atrial pressure of 15 mmHg.   Comparison(s): Prior images reviewed side by side. The left ventricular  function is worsened. Mitral insufficiency is worse.   Antimicrobials:  Anti-infectives (From admission, onward)    Start     Dose/Rate Route Frequency Ordered Stop   04/11/22 2200  levofloxacin (LEVAQUIN) tablet 750 mg        750 mg Oral Daily at bedtime 04/11/22 1022 04/27/22 2359   04/11/22 2200  doxycycline (VIBRA-TABS) tablet 100 mg        100 mg Oral Every 12 hours 04/11/22 1321 04/27/22 2359   04/10/22 2200  ceFEPIme (MAXIPIME) 2 g in sodium chloride 0.9 % 100 mL IVPB  Status:  Discontinued        2 g 200 mL/hr over 30 Minutes Intravenous Every 12 hours 04/10/22 1215 04/11/22 1022   04/09/22 2200  amoxicillin-clavulanate (AUGMENTIN) 875-125 MG per tablet 1 tablet  Status:  Discontinued        1 tablet Oral Every 12 hours 04/09/22 1229 04/09/22 1615   04/09/22 2200  doxycycline (VIBRA-TABS) tablet 100 mg  Status:  Discontinued  100 mg Oral Every 12 hours 04/09/22 1229 04/11/22 1022   04/09/22 2200  ceFEPIme (MAXIPIME) 2 g in sodium chloride 0.9 % 100 mL IVPB  Status:  Discontinued        2 g 200 mL/hr over 30 Minutes Intravenous Every 8 hours 04/09/22 1615 04/10/22 1215   04/06/22 0845  ceFAZolin (ANCEF) IVPB 3g/100 mL premix  Status:  Discontinued        3 g 200 mL/hr over 30 Minutes Intravenous On call to O.R. 04/06/22 0839 04/06/22 1153   04/06/22 0600  vancomycin (VANCOREADY) IVPB 1750 mg/350 mL  Status:  Discontinued        1,750  mg 175 mL/hr over 120 Minutes Intravenous Every 36 hours 04/04/22 1602 04/05/22 1121   04/05/22 2000  metroNIDAZOLE (FLAGYL) tablet 500 mg  Status:  Discontinued        500 mg Oral Every 12 hours 04/05/22 1021 04/09/22 1229   04/05/22 1630  vancomycin (VANCOCIN) IVPB 1000 mg/200 mL premix  Status:  Discontinued        1,000 mg 200 mL/hr over 60 Minutes Intravenous Every 24 hours 04/05/22 1121 04/09/22 1229   04/04/22 1400  cefTRIAXone (ROCEPHIN) 2 g in sodium chloride 0.9 % 100 mL IVPB  Status:  Discontinued        2 g 200 mL/hr over 30 Minutes Intravenous Every 24 hours 04/04/22 1218 04/09/22 1229   04/04/22 1400  metroNIDAZOLE (FLAGYL) IVPB 500 mg  Status:  Discontinued        500 mg 100 mL/hr over 60 Minutes Intravenous Every 8 hours 04/04/22 1218 04/05/22 1021   04/04/22 1400  vancomycin (VANCOCIN) 2,500 mg in sodium chloride 0.9 % 500 mL IVPB        2,500 mg 262.5 mL/hr over 120 Minutes Intravenous  Once 04/04/22 1312 04/04/22 1800   04/04/22 0800  ceFAZolin (ANCEF) IVPB 3g/100 mL premix        3 g 200 mL/hr over 30 Minutes Intravenous On call to O.R. 04/04/22 0746 04/04/22 1002       Subjective: He says he's interested in rehab today  Objective: Vitals:   04/12/22 0838 04/12/22 1131 04/12/22 1600 04/12/22 1935  BP: 107/60 110/60 122/63 127/74  Pulse: 93 82 90 (!) 104  Resp: '20 18 20 '$ (!) 26  Temp: 98.5 F (36.9 C) 98.8 F (37.1 C) 98 F (36.7 C) 98.7 F (37.1 C)  TempSrc: Oral Oral Oral Oral  SpO2:  100% 98% 95%  Weight:      Height:        Intake/Output Summary (Last 24 hours) at 04/12/2022 1950 Last data filed at 04/12/2022 1936 Gross per 24 hour  Intake 480 ml  Output 5075 ml  Net -4595 ml   Filed Weights   04/10/22 0626 04/11/22 0429 04/12/22 0325  Weight: 96.6 kg 130.8 kg 132 kg    Examination:  General: No acute distress. Cardiovascular: RRR Lungs: unlabored Abdomen: Soft, nontender, nondistended  Neurological: Alert and oriented 3. Moves all  extremities 4 with equal strength. Cranial nerves II through XII grossly intact. Extremities: RLE edema, LLE with dressing intact, wound vac  Data Reviewed: I have personally reviewed following labs and imaging studies  CBC: Recent Labs  Lab 04/08/22 0125 04/09/22 1125 04/10/22 0429 04/11/22 0321 04/12/22 0818  WBC 10.9* 11.9* 15.1* 13.6* 11.6*  NEUTROABS 8.2* 9.7* 12.5* 10.2*  --   HGB 10.9* 11.8* 11.7* 10.2* 10.4*  HCT 32.5* 34.3* 34.8* 29.4* 30.6*  MCV 92.9 90.7  92.3 91.6 91.9  PLT 200 237 252 209 170    Basic Metabolic Panel: Recent Labs  Lab 04/07/22 0223 04/08/22 0125 04/09/22 1125 04/10/22 0429 04/11/22 0321 04/12/22 0818  NA 129* 129* 134* 135 136 133*  K 5.0 4.1 3.7 3.7 3.2* 3.1*  CL 92* 97* 96* 96* 96* 94*  CO2 '24 22 27 28 28 31  '$ GLUCOSE 154* 130* 164* 139* 115* 123*  BUN 52* 51* 41* 34* 34* 33*  CREATININE 1.84* 1.56* 1.22 1.15 1.01 1.06  CALCIUM 9.1 9.1 9.3 9.0 9.2 8.7*  MG 2.0 1.9 1.4* 1.7 1.7  --   PHOS  --  3.5 3.1 2.9  --   --     GFR: Estimated Creatinine Clearance: 76.2 mL/min (by C-G formula based on SCr of 1.06 mg/dL).  Liver Function Tests: Recent Labs  Lab 04/08/22 0125 04/09/22 1125 04/10/22 0429 04/12/22 0818  AST  --   --   --  16  ALT  --   --   --  10  ALKPHOS  --   --   --  55  BILITOT  --   --   --  1.4*  PROT  --   --   --  5.6*  ALBUMIN 2.9* 2.7* 2.6* 2.6*    CBG: Recent Labs  Lab 04/06/22 0813 04/06/22 1047 04/06/22 1207  GLUCAP 130* 121* 119*     Recent Results (from the past 240 hour(s))  Aerobic/Anaerobic Culture w Gram Stain (surgical/deep wound)     Status: None   Collection Time: 04/04/22  9:44 AM   Specimen: PATH Soft tissue  Result Value Ref Range Status   Specimen Description LEG  Final   Special Requests  LEFT DEBRIDEMENT  Final   Gram Stain NO WBC SEEN NO ORGANISMS SEEN   Final   Culture   Final    RARE SERRATIA MARCESCENS NO ANAEROBES ISOLATED Performed at [facility], [place]  [address] [place], [place] [address]    Report Status 04/11/2022 FINAL  Final   Organism ID, Bacteria SERRATIA MARCESCENS  Final      Susceptibility   Serratia marcescens - MIC*    CEFAZOLIN >=64 RESISTANT Resistant     CEFEPIME <=0.12 SENSITIVE Sensitive     CEFTAZIDIME <=1 SENSITIVE Sensitive     CEFTRIAXONE <=0.25 SENSITIVE Sensitive     CIPROFLOXACIN <=0.25 SENSITIVE Sensitive     GENTAMICIN <=1 SENSITIVE Sensitive     TRIMETH/SULFA <=20 SENSITIVE Sensitive     * RARE SERRATIA MARCESCENS  Surgical pcr screen     Status: None   Collection Time: 04/05/22  8:11 PM   Specimen: Nasal Mucosa; Nasal Swab  Result Value Ref Range Status   MRSA, PCR NEGATIVE NEGATIVE Final   Staphylococcus aureus NEGATIVE NEGATIVE Final    Comment: (NOTE) The Xpert SA Assay (FDA approved for NASAL specimens in patients 72 years of age and older), is one component of Anique Beckley comprehensive surveillance program. It is not intended to diagnose infection nor to guide or monitor treatment. Performed at [facility], [place] [address] [place], [place] [address]   MRSA Next Gen by PCR, Nasal     Status: None   Collection Time: 04/06/22 12:25 PM   Specimen: Nasal Mucosa; Nasal Swab  Result Value Ref Range Status   MRSA by PCR Next Gen NOT DETECTED NOT DETECTED Final    Comment: (NOTE) The GeneXpert MRSA Assay (FDA approved for NASAL specimens only), is one component  of Tiki Tucciarone comprehensive MRSA colonization surveillance program. It is not intended to diagnose MRSA infection nor to guide or monitor treatment for MRSA infections. Test performance is not FDA approved in patients less than 44 years old. Performed at [facility], [place] [address] [place], [place] [address]          Radiology Studies: ECHOCARDIOGRAM LIMITED  Result Date: 04/12/2022    ECHOCARDIOGRAM LIMITED REPORT   Patient Name:   Ryan Garner Date of Exam: 04/12/2022 Medical Rec #:  [id number]      Height:       68.0 in Accession #:     [id number]     Weight:       291.1 lb Date of Birth:  23-Jan-1946       BSA:          2.397 m Patient Age:    76 years       BP:           107/60 mmHg Patient Gender: M              HR:           89 bpm. Exam Location:  Inpatient Procedure: Limited Echo, Cardiac Doppler and Color Doppler Indications:    Mitral valve insufficiency  History:        Patient has prior history of Echocardiogram examinations, most                 recent 04/07/2022. CHF, Previous Myocardial Infarction and CAD,                 Arrythmias:Atrial Fibrillation, Signs/Symptoms:Dyspnea; Risk                 Factors:Sleep Apnea, Hypertension, Dyslipidemia and Diabetes.  Sonographer:    Eartha Inch Referring Phys: [id number] [place]  Sonographer Comments: Technically challenging study due to limited acoustic windows. Image acquisition challenging due to patient body habitus. IMPRESSIONS  1. Very limited study to evaluated mitral regurgitation. Severe mitral valve regurgitation appears to be related to restricted PMVL. Jet is posteriorly directed. Unchanged from prior. The mitral valve is abnormal. Severe mitral valve regurgitation. No evidence of mitral stenosis.  2. Left ventricular ejection fraction, by estimation, is 50 to 55%. The left ventricle has low normal function. The left ventricle demonstrates global hypokinesis. There is the interventricular septum is flattened in systole and diastole, consistent with right ventricular pressure and volume overload.  3. Right ventricular systolic function was not well visualized. The right ventricular size is not well visualized. Comparison(s): No significant change from prior study. FINDINGS  Left Ventricle: Left ventricular ejection fraction, by estimation, is 50 to 55%. The left ventricle has low normal function. The left ventricle demonstrates global hypokinesis. The interventricular septum is flattened in systole and diastole, consistent  with right ventricular pressure and volume  overload. Right Ventricle: The right ventricular size is not well visualized. Right vetricular wall thickness was not well visualized. Right ventricular systolic function was not well visualized. Pericardium: There is no evidence of pericardial effusion. Mitral Valve: Very limited study to evaluated mitral regurgitation. Severe mitral valve regurgitation appears to be related to restricted PMVL. Jet is posteriorly directed. Unchanged from prior. The mitral valve is abnormal. Severe mitral valve regurgitation. No evidence of mitral valve stenosis. MR Peak grad:    86.1 mmHg MR Mean grad:    63.0 mmHg MR Vmax:         464.00 cm/s MR Vmean:  380.0 cm/s MR PISA:         1.57 cm MR PISA Eff ROA: 15 mm MR PISA Radius:  0.50 cm Eleonore Chiquito MD Electronically signed by Eleonore Chiquito MD Signature Date/Time: 04/12/2022/1:38:43 PM    Final         Scheduled Meds:  apixaban  5 mg Oral BID   vitamin C  1,000 mg Oral Daily   atorvastatin  80 mg Oral Daily   Chlorhexidine Gluconate Cloth  6 each Topical Daily   clopidogrel  75 mg Oral Q breakfast   docusate sodium  100 mg Oral BID   doxycycline  100 mg Oral Q12H   furosemide  80 mg Oral BID   levofloxacin  750 mg Oral QHS   metoprolol tartrate  25 mg Oral BID   nutrition supplement (JUVEN)  1 packet Oral BID BM   pantoprazole  40 mg Oral Daily   potassium chloride  20 mEq Oral TID   zinc sulfate  220 mg Oral Daily   Continuous Infusions:  methocarbamol (ROBAXIN) IV       LOS: 8 days    Time spent: over 30 min    Fayrene Helper, MD [facility]   To contact the attending provider between 7A-7P or the covering provider during after hours 7P-7A, please log into the web site [email or web address] and access using universal [place] password for that web site. If you do not have the password, please call the hospital operator.  04/12/2022, 7:50 PM

## 2022-04-12 NOTE — Progress Notes (Signed)
  Echocardiogram 2D Echocardiogram has been performed.  Ryan Garner 04/12/2022, 11:15 AM

## 2022-04-12 NOTE — NC FL2 (Signed)
[place] LEVEL OF CARE SCREENING TOOL     IDENTIFICATION  Patient Name: Ryan Garner Birthdate: [birth year] Sex: male Admission Date (Current Location): 04/04/2022  [facility] and [place] Number:  [employer] and Address:  [facility], [place] [address] [place], [place] [address]      Provider Number: [id number]  Attending Physician Name and Address:  Newt Minion, MD  Relative Name and Phone Number:       Current Level of Care: Hospital Recommended Level of Care: [place] Prior Approval Number:    Date Approved/Denied:   PASRR Number: [id number]  Discharge Plan: SNF    Current Diagnoses: Patient Active Problem List   Diagnosis Date Noted   Nonrheumatic mitral (valve) insufficiency    Aortic valve stenosis, nonrheumatic    Hypotension    Transient hypotension 04/06/2022   Bradycardia 04/06/2022   (HFpEF) heart failure with preserved ejection fraction ([place]) 04/06/2022   Abscess of leg, left 04/04/2022   Cellulitis of left lower extremity 03/02/2022   Chronic diastolic CHF (congestive heart failure) ([place]) 03/02/2022   Cellulitis of left leg 03/02/2022   Foot callus 12/15/2021   Other secondary pulmonary hypertension ([place]) 08/30/2021   COPD (chronic obstructive pulmonary disease) ([place]) 08/30/2021   Coronary artery disease due to type 2 diabetes mellitus ([place]) 07/21/2020   CHF (congestive heart failure) ([place]) 07/21/2020   History of ST elevation myocardial infarction (STEMI) 04/30/2020   Atrial fibrillation ([place]) 02/29/2020   Acute kidney injury superimposed on chronic kidney disease ([place]) 02/28/2020   Hyponatremia 02/28/2020   Lumbar radiculopathy 03/24/2019   Morbid obesity ([place]) 09/04/2018   Lung nodules 01/17/2017   Ascending aortic aneurysm 01/17/2017   Type 2 diabetes mellitus ([place]) 03/20/2016   Tear of lateral meniscus of right knee 11/19/2014   Hypertension    Hyperlipidemia    Metabolic  syndrome    Osteoarthritis of both knees 07/06/2013   Acquired spondylolisthesis 03/06/2013   Degeneration of lumbar intervertebral disc 03/06/2013   Lumbar spondylosis 03/06/2013   Spinal stenosis of lumbar region 03/06/2013    Orientation RESPIRATION BLADDER Height & Weight     Time, Situation, Place  Normal External catheter, Continent Weight: 291 lb 1.6 oz (132 kg) Height:  '5\' 8"'$  (172.7 cm)  BEHAVIORAL SYMPTOMS/MOOD NEUROLOGICAL BOWEL NUTRITION STATUS      Continent Diet (please see discharge summary)  AMBULATORY STATUS COMMUNICATION OF NEEDS Skin   Limited Assist Verbally Surgical wounds (closed incision left leg,negative pressure wound therapy lower left leg)                       Personal Care Assistance Level of Assistance  Bathing, Feeding, Dressing Bathing Assistance: Limited assistance Feeding assistance: Independent Dressing Assistance: Limited assistance     Functional Limitations Info  Sight, Hearing, Speech Sight Info: Impaired Hearing Info: Impaired Speech Info: Adequate    SPECIAL CARE FACTORS FREQUENCY                       Contractures Contractures Info: Not present    Additional Factors Info  Code Status, Allergies Code Status Info: FULL Allergies Info: Flecainide,Rofecoxib           Current Medications (04/12/2022):  This is the current hospital active medication list Current Facility-Administered Medications  Medication Dose Route Frequency Provider Last Rate Last Admin   acetaminophen (TYLENOL) tablet 325-650 mg  325-650 mg Oral Q6H PRN Sharol Given,  Illene Regulus, MD       alum & mag hydroxide-simeth (MAALOX/MYLANTA) 200-200-20 MG/5ML suspension 15-30 mL  15-30 mL Oral Q2H PRN Newt Minion, MD       apixaban Arne Cleveland) tablet 5 mg  5 mg Oral BID Newt Minion, MD   5 mg at 04/12/22 1610   ascorbic acid (VITAMIN C) tablet 1,000 mg  1,000 mg Oral Daily Newt Minion, MD   1,000 mg at 04/12/22 0855   atorvastatin (LIPITOR) tablet 80 mg  80  mg Oral Daily Newt Minion, MD   80 mg at 04/12/22 0855   bisacodyl (DULCOLAX) EC tablet 5 mg  5 mg Oral Daily PRN Newt Minion, MD       bisacodyl (DULCOLAX) suppository 10 mg  10 mg Rectal Daily PRN Newt Minion, MD       Chlorhexidine Gluconate Cloth 2 % PADS 6 each  6 each Topical Daily Newt Minion, MD   6 each at 04/12/22 0857   clopidogrel (PLAVIX) tablet 75 mg  75 mg Oral Q breakfast Newt Minion, MD   75 mg at 04/12/22 0855   docusate sodium (COLACE) capsule 100 mg  100 mg Oral BID Newt Minion, MD   100 mg at 04/12/22 0852   doxycycline (VIBRA-TABS) tablet 100 mg  100 mg Oral Q12H Laurice Record, MD   100 mg at 04/12/22 0851   furosemide (LASIX) tablet 80 mg  80 mg Oral BID Croitoru, Mihai, MD       guaiFENesin-dextromethorphan (ROBITUSSIN DM) 100-10 MG/5ML syrup 15 mL  15 mL Oral Q4H PRN Newt Minion, MD       hydrALAZINE (APRESOLINE) injection 5 mg  5 mg Intravenous Q20 Min PRN Newt Minion, MD       HYDROmorphone (DILAUDID) injection 0.5-1 mg  0.5-1 mg Intravenous Q4H PRN Newt Minion, MD   1 mg at 04/06/22 1208   labetalol (NORMODYNE) injection 10 mg  10 mg Intravenous Q10 min PRN Newt Minion, MD       levalbuterol Penne Lash) nebulizer solution 0.63 mg  0.63 mg Nebulization Q6H PRN Eugenie Filler, MD   0.63 mg at 04/10/22 1433   levofloxacin (LEVAQUIN) tablet 750 mg  750 mg Oral QHS Laurice Record, MD   750 mg at 04/11/22 2044   magnesium citrate solution 1 Bottle  1 Bottle Oral Once PRN Newt Minion, MD       methocarbamol (ROBAXIN) tablet 500 mg  500 mg Oral Q6H PRN Newt Minion, MD       Or   methocarbamol (ROBAXIN) 500 mg in dextrose 5 % 50 mL IVPB  500 mg Intravenous Q6H PRN Newt Minion, MD       metoCLOPramide (REGLAN) tablet 5-10 mg  5-10 mg Oral Q8H PRN Newt Minion, MD       Or   metoCLOPramide (REGLAN) injection 5-10 mg  5-10 mg Intravenous Q8H PRN Newt Minion, MD       metoprolol tartrate (LOPRESSOR) injection 2-5 mg  2-5 mg  Intravenous Q2H PRN Newt Minion, MD       metoprolol tartrate (LOPRESSOR) tablet 25 mg  25 mg Oral BID Sande Rives E, PA-C   25 mg at 04/12/22 9604   nutrition supplement (JUVEN) (JUVEN) powder packet 1 packet  1 packet Oral BID BM Newt Minion, MD   1 packet at 04/12/22 1413   ondansetron (ZOFRAN) tablet 4 mg  4 mg Oral Q6H PRN Newt Minion, MD       Or   ondansetron [facility]) injection 4 mg  4 mg Intravenous Q6H PRN Newt Minion, MD       oxyCODONE (Oxy IR/ROXICODONE) immediate release tablet 10-15 mg  10-15 mg Oral Q4H PRN Newt Minion, MD   10 mg at 04/06/22 1324   oxyCODONE (Oxy IR/ROXICODONE) immediate release tablet 5-10 mg  5-10 mg Oral Q4H PRN Newt Minion, MD   10 mg at 04/07/22 0432   pantoprazole (PROTONIX) EC tablet 40 mg  40 mg Oral Daily Newt Minion, MD   40 mg at 04/12/22 0852   phenol (CHLORASEPTIC) mouth spray 1 spray  1 spray Mouth/Throat PRN Newt Minion, MD       polyethylene glycol (MIRALAX / GLYCOLAX) packet 17 g  17 g Oral Daily PRN Newt Minion, MD   17 g at 04/09/22 0841   potassium chloride SA (KLOR-CON M) CR tablet 20 mEq  20 mEq Oral TID Croitoru, Mihai, MD       zinc sulfate capsule 220 mg  220 mg Oral Daily Newt Minion, MD   220 mg at 04/12/22 5170     Discharge Medications: Please see discharge summary for a list of discharge medications.  Relevant Imaging Results:  Relevant Lab Results:   Additional Information [id number]  Vinie Sill, LCSW

## 2022-04-12 NOTE — Progress Notes (Signed)
Mobility Specialist Progress Note:   04/12/22 1623  Mobility  Activity Ambulated with assistance in hallway  Level of Assistance Moderate assist, patient does 50-74%  Assistive Device  (EVA walker)  Distance Ambulated (ft) 70 ft  Activity Response Tolerated well  $Mobility charge 1 Mobility   Pt received in chair willing to participate in mobility. No complaints of pain. ModA to stand from chair. Left in bed with call bell in reach and all needs met.   [facility] Surveyor, mining Chat only

## 2022-04-12 NOTE — Progress Notes (Signed)
Rounding Note    Patient Name: Ryan Garner Date of Encounter: 04/12/2022  [place] Cardiologist: Ryan Carnes, MD   Subjective   Denies dyspnea at rest.  Continues have significant edema. Excellent urine output.  Based on intake/output he has lost 25 L since admission, but this is clearly not the case based on physical exam. Weight clearly unreliable.  Inpatient Medications    Scheduled Meds:  apixaban  5 mg Oral BID   vitamin C  1,000 mg Oral Daily   atorvastatin  80 mg Oral Daily   Chlorhexidine Gluconate Cloth  6 each Topical Daily   clopidogrel  75 mg Oral Q breakfast   docusate sodium  100 mg Oral BID   doxycycline  100 mg Oral Q12H   furosemide  60 mg Intravenous BID   levofloxacin  750 mg Oral QHS   metoprolol tartrate  25 mg Oral BID   nutrition supplement (JUVEN)  1 packet Oral BID BM   pantoprazole  40 mg Oral Daily   potassium chloride  20 mEq Oral Once per day on Sun Tue Thu Sat   And   potassium chloride  40 mEq Oral Q M,W,F   zinc sulfate  220 mg Oral Daily   Continuous Infusions:  methocarbamol (ROBAXIN) IV     PRN Meds: acetaminophen, alum & mag hydroxide-simeth, bisacodyl, bisacodyl, guaiFENesin-dextromethorphan, hydrALAZINE, HYDROmorphone (DILAUDID) injection, labetalol, levalbuterol, magnesium citrate, methocarbamol **OR** methocarbamol (ROBAXIN) IV, metoCLOPramide **OR** metoCLOPramide (REGLAN) injection, metoprolol tartrate, ondansetron **OR** ondansetron (ZOFRAN) IV, oxyCODONE, oxyCODONE, phenol, polyethylene glycol   Vital Signs    Vitals:   04/11/22 2307 04/12/22 0325 04/12/22 0838 04/12/22 1131  BP: 98/64 98/65 107/60 110/60  Pulse: 100 86 93 82  Resp: '20 20 20 18  '$ Temp: 99.9 F (37.7 C) 98 F (36.7 C) 98.5 F (36.9 C) 98.8 F (37.1 C)  TempSrc: Oral Oral Oral Oral  SpO2: 91% 94%  100%  Weight:  132 kg    Height:        Intake/Output Summary (Last 24 hours) at 04/12/2022 1209 Last data filed at 04/12/2022 1100 Gross  per 24 hour  Intake 240 ml  Output 6330 ml  Net -6090 ml      04/12/2022    3:25 AM 04/11/2022    4:29 AM 04/10/2022    6:26 AM  Last 3 Weights  Weight (lbs) 291 lb 1.6 oz 288 lb 6.4 oz 212 lb 15.4 oz  Weight (kg) 132.042 kg 130.817 kg 96.6 kg      Telemetry    Atrial fibrillation with good ventricular rate control- Personally Reviewed  ECG    No new tracing- Personally Reviewed  Physical Exam  Obese GEN: No acute distress.   Neck: 8 cm JVD Cardiac: RRR, no murmurs, rubs, or gallops.  Respiratory: Clear to auscultation bilaterally. GI: Soft, nontender, non-distended  MS: 3+ lower extremity edema, slightly asymmetrical worse on the right ; No deformity.  Wound VAC in place right knee Neuro:  Nonfocal  Psych: Normal affect   Labs    High Sensitivity Troponin:   Recent Labs  Lab 04/06/22 1253 04/06/22 1451  TROPONINIHS 21* 27*     Chemistry Recent Labs  Lab 04/09/22 1125 04/10/22 0429 04/11/22 0321 04/12/22 0818  NA 134* 135 136 133*  K 3.7 3.7 3.2* 3.1*  CL 96* 96* 96* 94*  CO2 '27 28 28 31  '$ GLUCOSE 164* 139* 115* 123*  BUN 41* 34* 34* 33*  CREATININE 1.22 1.15  1.01 1.06  CALCIUM 9.3 9.0 9.2 8.7*  MG 1.4* 1.7 1.7  --   PROT  --   --   --  5.6*  ALBUMIN 2.7* 2.6*  --  2.6*  AST  --   --   --  16  ALT  --   --   --  10  ALKPHOS  --   --   --  55  BILITOT  --   --   --  1.4*  GFRNONAA >60 >60 >60 >60  ANIONGAP '11 11 12 8    '$ Lipids No results for input(s): "CHOL", "TRIG", "HDL", "LABVLDL", "LDLCALC", "CHOLHDL" in the last 168 hours.  Hematology Recent Labs  Lab 04/10/22 0429 04/11/22 0321 04/12/22 0818  WBC 15.1* 13.6* 11.6*  RBC 3.77* 3.21* 3.33*  HGB 11.7* 10.2* 10.4*  HCT 34.8* 29.4* 30.6*  MCV 92.3 91.6 91.9  MCH 31.0 31.8 31.2  MCHC 33.6 34.7 34.0  RDW 15.1 15.2 15.0  PLT 252 209 217   Thyroid No results for input(s): "TSH", "FREET4" in the last 168 hours.  BNP Recent Labs  Lab 04/06/22 1253  BNP 682.5*    DDimer No results for  input(s): "DDIMER" in the last 168 hours.   Radiology    No results found.  Cardiac Studies   Preliminary review of follow-up echo at bedside today continues to show severe mitral insufficiency with an eccentric jet directed posteriorly and laterally.  The exact mechanism remains uncertain.  Normal left ventricular systolic function.  Patient Profile     76 y.o. male with a history of CAD s/p STEMI in 2021 s/p DES to LCX, chronic diastolic CHF paroxysmal atrial fibrillation on Eliquis, hypertension, hyperlipidemia, type 2 diabetes mellitus, sleep apnea who presented to [facility] on 04/04/2022 for left leg excision debridement of a chronic abscess. Immediately after intubation, patient became bradycardic and hypotension but no mention of loss of pulses. He underwent resuscitation and responded very well. Decision was made to proceed with procedure and Cardiology was consulted afterwards for further evaluation.    Assessment & Plan    He has severe mitral insufficiency. Note plans for possible discharge tomorrow. Ryan Garner will require transesophageal echocardiography and a repeat right and left heart catheterization to fully characterize the severity and etiology of severe mitral insufficiency, with secondary severe pulmonary hypertension. Ideally, this work-up will be delayed until his abscess is healed. We will start transition from intravenous to oral diuretics today, since there is a plan for discharge.  It will be important continue monitoring weight on a daily basis as an outpatient. Problems with hypokalemia.  BP may not tolerate increase spironolactone. Increase KCl supplement.     For questions or updates, please contact [place] Please consult [email or web address] for contact info under        Signed, Ryan Klein, MD  04/12/2022, 12:09 PM

## 2022-04-12 NOTE — Progress Notes (Signed)
Patient ID: Ryan Garner, male   DOB: [birth year], 76 y.o.   MRN: [id number] Patient is status post debridement abscess left leg.  There is no drainage in the wound VAC canister.  Patient continues to diuresis.  Patient states that if he can get therapy at home he would prefer discharge to home.  Would plan for discharge to home tomorrow Friday with home health physical therapy if patient safe for discharge from a medical and therapy standpoint.

## 2022-04-12 NOTE — TOC Progression Note (Signed)
Transition of Care [facility]) - Progression Note    Patient Details  Name: Ryan Garner MRN: [id number] Date of Birth: 1945/09/03  Transition of [facility]) CM/SW [place], [place] Phone Number: 04/12/2022, 2:52 PM  Clinical Narrative:     CSW met with patient. Patient confirmed he is agreeable to short term rehab at [facility]. CSW explained the SNF process. Patient states no preferred SNF at this time. Patient states no questions at this time.   TOC will provide bed offers once available TOC will continue to follow and assist with discharge plan.   [name], MSW, LCSW Clinical Social Worker    Expected Discharge Plan: Skilled Nursing Facility Barriers to Discharge: Continued Medical Work up  Expected Discharge Plan and Services Expected Discharge Plan: [place] arrangements for the past 2 months: Single Family Home                                       Social Determinants of Health (SDOH) Interventions    Readmission Risk Interventions     No data to display

## 2022-04-13 DIAGNOSIS — I5033 Acute on chronic diastolic (congestive) heart failure: Secondary | ICD-10-CM | POA: Diagnosis not present

## 2022-04-13 DIAGNOSIS — L02416 Cutaneous abscess of left lower limb: Secondary | ICD-10-CM | POA: Diagnosis not present

## 2022-04-13 DIAGNOSIS — I2510 Atherosclerotic heart disease of native coronary artery without angina pectoris: Secondary | ICD-10-CM | POA: Diagnosis not present

## 2022-04-13 LAB — COMPREHENSIVE METABOLIC PANEL
ALT: 10 U/L (ref 0–44)
AST: 18 U/L (ref 15–41)
Albumin: 2.6 g/dL — ABNORMAL LOW (ref 3.5–5.0)
Alkaline Phosphatase: 64 U/L (ref 38–126)
Anion gap: 11 (ref 5–15)
BUN: 33 mg/dL — ABNORMAL HIGH (ref 8–23)
CO2: 29 mmol/L (ref 22–32)
Calcium: 8.8 mg/dL — ABNORMAL LOW (ref 8.9–10.3)
Chloride: 93 mmol/L — ABNORMAL LOW (ref 98–111)
Creatinine, Ser: 1.06 mg/dL (ref 0.61–1.24)
GFR, Estimated: >60 mL/min (ref >60)
Glucose, Bld: 118 mg/dL — ABNORMAL HIGH (ref 70–99)
Potassium: 3.6 mmol/L (ref 3.5–5.1)
Sodium: 133 mmol/L — ABNORMAL LOW (ref 135–145)
Total Bilirubin: 1.2 mg/dL (ref 0.3–1.2)
Total Protein: 5.6 g/dL — ABNORMAL LOW (ref 6.5–8.1)

## 2022-04-13 LAB — CBC WITH DIFFERENTIAL/PLATELET
Abs Immature Granulocytes: 0.09 10*3/uL — ABNORMAL HIGH (ref 0.00–0.07)
Basophils Absolute: 0 10*3/uL (ref 0.0–0.1)
Basophils Relative: 0 %
Eosinophils Absolute: 0.5 10*3/uL (ref 0.0–0.5)
Eosinophils Relative: 5 %
HCT: 31.5 % — ABNORMAL LOW (ref 39.0–52.0)
Hemoglobin: 10.8 g/dL — ABNORMAL LOW (ref 13.0–17.0)
Immature Granulocytes: 1 %
Lymphocytes Relative: 13 %
Lymphs Abs: 1.3 10*3/uL (ref 0.7–4.0)
MCH: 31.4 pg (ref 26.0–34.0)
MCHC: 34.3 g/dL (ref 30.0–36.0)
MCV: 91.6 fL (ref 80.0–100.0)
Monocytes Absolute: 1.2 10*3/uL — ABNORMAL HIGH (ref 0.1–1.0)
Monocytes Relative: 11 %
Neutro Abs: 7.3 10*3/uL (ref 1.7–7.7)
Neutrophils Relative %: 70 %
Platelets: 230 10*3/uL (ref 150–400)
RBC: 3.44 MIL/uL — ABNORMAL LOW (ref 4.22–5.81)
RDW: 14.8 % (ref 11.5–15.5)
WBC: 10.4 10*3/uL (ref 4.0–10.5)
nRBC: 0 % (ref 0.0–0.2)

## 2022-04-13 LAB — PHOSPHORUS: Phosphorus: 2.2 mg/dL — ABNORMAL LOW (ref 2.5–4.6)

## 2022-04-13 LAB — MAGNESIUM: Magnesium: 1.5 mg/dL — ABNORMAL LOW (ref 1.7–2.4)

## 2022-04-13 MED ORDER — DOXYCYCLINE HYCLATE 100 MG PO TABS: 100.0000 mg | ORAL_TABLET | Freq: Two times a day (BID) | ORAL | 0 refills | Status: DC

## 2022-04-13 MED ORDER — OXYCODONE HCL 10 MG PO TABS: 10.0000 mg | ORAL_TABLET | ORAL | 0 refills | Status: DC | PRN

## 2022-04-13 MED ORDER — METHOCARBAMOL 500 MG PO TABS: 500.0000 mg | ORAL_TABLET | Freq: Four times a day (QID) | ORAL | 0 refills | Status: DC | PRN

## 2022-04-13 MED ORDER — JUVEN PO PACK: 1.0000 | PACK | Freq: Two times a day (BID) | ORAL | 1 refills | Status: DC

## 2022-04-13 MED ORDER — ASCORBIC ACID 1000 MG PO TABS: 1000.0000 mg | ORAL_TABLET | Freq: Every day | ORAL | 1 refills | Status: DC

## 2022-04-13 MED ORDER — LEVOFLOXACIN 750 MG PO TABS: 750.0000 mg | ORAL_TABLET | Freq: Every day | ORAL | 0 refills | Status: DC

## 2022-04-13 MED ORDER — ZINC SULFATE 220 (50 ZN) MG PO CAPS: 220.0000 mg | ORAL_CAPSULE | Freq: Every day | ORAL | 0 refills | Status: DC

## 2022-04-13 MED ORDER — MAGNESIUM SULFATE 2 GM/50ML IV SOLN
2.0000 g | Freq: Once | INTRAVENOUS | Status: AC
  Administered 2022-04-13: 2 g via INTRAVENOUS
  Filled 2022-04-13: qty 50

## 2022-04-13 MED ORDER — FUROSEMIDE 80 MG PO TABS: 80.0000 mg | ORAL_TABLET | Freq: Two times a day (BID) | ORAL | 0 refills | Status: DC

## 2022-04-13 NOTE — Discharge Instructions (Signed)
Heart Failure Education: Weigh yourself EVERY morning after you go to the bathroom but before you eat or drink anything. Write this number down in a weight log/diary. If you gain 3 pounds overnight or 5 pounds in a week, call the office. Take your medicines as prescribed. If you have concerns about your medications, please call us before you stop taking them.  Eat low salt foods--Limit salt (sodium) to 2000 mg per day. This will help prevent your body from holding onto fluid. Read food labels as many processed foods have a lot of sodium, especially canned goods and prepackaged meats. If you would like some assistance choosing low sodium foods, we would be happy to set you up with a nutritionist. Limit all fluids for the day to less than 2 liters (64 ounces). Fluid includes all drinks, coffee, juice, ice chips, soup, jello, and all other liquids. Stay as active as you can everyday. Staying active will give you more energy and make your muscles stronger. Start with 5 minutes at a time and work your way up to 30 minutes a day. Break up your activities--do some in the morning and some in the afternoon. Start with 3 days per week and work your way up to 5 days as you can.  If you have chest pain, feel short of breath, dizzy, or lightheaded, STOP. If you don't feel better after a short rest, call 911. If you do feel better, call the office to let us know you have symptoms with exercise.  

## 2022-04-13 NOTE — Progress Notes (Addendum)
Patient ID: Ryan Garner, male   DOB: [birth year], 76 y.o.   MRN: [id number] Patient is status post debridement abscess left leg on 04/06/22.  There is no drainage in the wound VAC canister.  At time of discharge wound vac to transferred to praveena portable vac.  Patient continues with diuresis.    The plan is for discharge to skilled nursing for rehab.   Suzan Slick, NP [phone]

## 2022-04-13 NOTE — Consult Note (Signed)
   [facility] Inpatient Consult   04/13/2022  Ryan Garner Apr 18, 1946 [id number]  [name] Organization [ACO] Patient: Medicare ACO REACH  Primary Care Provider:  [name], [name], MD with [place], a provider that is listed for the [facility] follow up and care coordination.    Patient has been active with the [place] team.  If the patient goes to a [facility] affiliated facility then, patient can be followed by [facility] PAC RN with traditional Medicare and approved Medicare Advantage plans.    Plan:   [facility] PAC RN can follow for any known or needs for transitional care needs for returning to post facility care or complex disease management.  For questions or referrals, please contact:   Natividad Brood, RN BSN [place] Liaison  [phone] business mobile phone Toll free office [phone]  Fax number: [phone] [email or web address]

## 2022-04-13 NOTE — Progress Notes (Signed)
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 Pt s/p LLE I&D 9/13 and 9/15 with VAC. 9/15 during intubation pt with bradycardia and hypotension.  PMH: afib, CHF,  DM, HLD, HTN, STEMI, rotator cuff repair   OT comments  Pt agreeable to OT session. RN present at start of session and removed catheter. RN also assisted with trimming wrapping on LLE as wrap was rolled behind knee. Pt required modA for LB dressing, minguard for standing at sink for grooming and minA for ambulation to commode. Pt had BM and required maxA for posterior care. Will continue to follow acutely and progress appropriately.    Recommendations for follow up therapy are one component of a multi-disciplinary discharge planning process, led by the attending physician.  Recommendations may be updated based on patient status, additional functional criteria and insurance authorization.    Follow Up Recommendations  Skilled nursing-short term rehab (<3 hours/day)    Assistance Recommended at Discharge Intermittent Supervision/Assistance  Patient can return home with the following  A little help with bathing/dressing/bathroom;A little help with walking and/or transfers   Equipment Recommendations  None recommended by OT (defer)    Recommendations for Other Services PT consult    Precautions / Restrictions Precautions Precautions: Fall Precaution Comments: wound vac LLE Restrictions Weight Bearing Restrictions: Yes LLE Weight Bearing: Weight bearing as tolerated       Mobility Bed Mobility Overal bed mobility: Needs Assistance Bed Mobility: Supine to Sit     Supine to sit: Min assist     General bed mobility comments: minA for trunk progression     Transfers Overall transfer level: Needs assistance Equipment used: Rolling walker (2 wheels) (eva walker) Transfers: Sit to/from Stand Sit to Stand: Min assist           General transfer comment: minA to powerup into standing from elevated surface     Balance Overall balance assessment: Needs assistance Sitting-balance support: No upper extremity supported, Feet supported Sitting balance-Leahy Scale: Fair Sitting balance - Comments: sitting EOB without support   Standing balance support: Reliant on assistive device for balance, Bilateral upper extremity supported Standing balance-Leahy Scale: Poor Standing balance comment: reliant on AD in standing                           ADL either performed or assessed with clinical judgement   ADL Overall ADL's : Needs assistance/impaired     Grooming: Oral care;Standing;Min guard Grooming Details (indicate cue type and reason): completed while standing at sink level             Lower Body Dressing: Moderate assistance Lower Body Dressing Details (indicate cue type and reason): assistance to access BLE Toilet Transfer: Minimal assistance;Ambulation;Rolling walker (2 wheels) Toilet Transfer Details (indicate cue type and reason): pt had BM on BSC was able to ambulate distance to bathroom Toileting- Clothing Manipulation and Hygiene: Maximal assistance;Sit to/from stand Toileting - Clothing Manipulation Details (indicate cue type and reason): pericare     Functional mobility during ADLs: Minimal assistance;Rolling walker (2 wheels)      Extremity/Trunk Assessment Upper Extremity Assessment Upper Extremity Assessment: Overall WFL for tasks assessed   Lower Extremity Assessment Lower Extremity Assessment: Defer to  PT evaluation        Vision   Vision Assessment?: No apparent visual deficits   Perception Perception Perception: Not tested   Praxis Praxis Praxis: Not tested    Cognition  Arousal/Alertness: Awake/alert Behavior During Therapy: Flat affect Overall Cognitive Status: Within Functional Limits for tasks assessed                                 General Comments: pt motivated and particiapatory        Exercises      Shoulder Instructions       General Comments vss on RA    Pertinent Vitals/ Pain       Pain Assessment Pain Assessment: 0-10 Pain Score: 2  Pain Location: general Pain Descriptors / Indicators: Sore Pain Intervention(s): Monitored during session, Limited activity within patient's tolerance  Home Living                                          Prior Functioning/Environment              Frequency  Min 2X/week        Progress Toward Goals  OT Goals(current goals can now be found in the care plan section)  Progress towards OT goals: Progressing toward goals  Acute Rehab OT Goals Patient Stated Goal: to walk and get back to baseline OT Goal Formulation: With patient Time For Goal Achievement: 04/19/22 Potential to Achieve Goals: Good ADL Goals Pt Will Perform Lower Body Bathing: with modified independence;sit to/from stand Pt Will Perform Lower Body Dressing: with modified independence;sit to/from stand Pt Will Transfer to Toilet: with modified independence;ambulating Pt Will Perform Tub/Shower Transfer: with modified independence;ambulating Additional ADL Goal #1: Pt will tolerate 15 mins of standing tasks to be able to complete meal preperations at home level  Plan Discharge plan remains appropriate;Frequency remains appropriate    Co-evaluation                 AM-PAC OT "6 Clicks" Daily Activity     Outcome Measure   Help from another person eating meals?: None Help from another person taking care of personal grooming?: A Little Help from another person toileting, which includes using toliet, bedpan, or urinal?: A Lot Help from another person bathing (including washing,  rinsing, drying)?: A Lot Help from another person to put on and taking off regular upper body clothing?: A Little Help from another person to put on and taking off regular lower body clothing?: A Lot 6 Click Score: 16    End of Session Equipment Utilized During Treatment: Rolling walker (2 wheels)  OT Visit Diagnosis: Unsteadiness on feet (R26.81);Other abnormalities of gait and mobility (R26.89);Muscle weakness (generalized) (M62.81);Pain Pain - Right/Left: Left Pain - part of body: Leg   Activity Tolerance Patient tolerated treatment well   Patient Left with call bell/phone within reach;in chair (pt verbalized he would not stand without staff)   Nurse Communication Mobility status;Other (comment) (notified pt on [facility], wants to sit there for a while, notified to press call button when finished)        Time: 0347-4259 OT Time Calculation (min): 36 min  Charges: OT General Charges $OT Visit: 1 Visit OT Treatments $Self Care/Home Management : 23-37 mins  Helene Kelp OTR/L Acute Rehabilitation Services Office: [place] 04/13/2022, 3:56 PM

## 2022-04-13 NOTE — TOC Progression Note (Signed)
Transition of Care [facility]) - Progression Note    Patient Details  Name: Ryan Garner MRN: [id number] Date of Birth: [birth year]  Transition of Care [facility]) CM/SW [place], [place] Phone Number: 04/13/2022, 3:11 PM  Clinical Narrative:     Per Dr. Florene Glen, patient will be ready for d/c tomorrow.   CSW informed SNF of anticipated d/c tomorrow.   TOC will continue to follow and assist with discharge planning.    [name], MSW, LCSW Clinical Social Worker    Expected Discharge Plan: Skilled Nursing Facility Barriers to Discharge: Continued Medical Work up  Expected Discharge Plan and Services Expected Discharge Plan: [place] arrangements for the past 2 months: Single Family Home Expected Discharge Date: 04/13/22                                     Social Determinants of Health (SDOH) Interventions    Readmission Risk Interventions     No data to display

## 2022-04-13 NOTE — Progress Notes (Signed)
Foley catheter removed per MD order without difficulty.   

## 2022-04-13 NOTE — TOC Progression Note (Signed)
Transition of Care [facility]) - Progression Note    Patient Details  Name: IMARI SIVERTSEN MRN: [id number] Date of Birth: [birth year]  Transition of Care [facility]) CM/SW [place],  Phone Number: 04/13/2022, 1:04 PM  Clinical Narrative:     CSW informed patient of bed offers- patient wants [facility], near his home.  CSW confirmed w/[place] they can admit patient today.   Per chart review, patient is ready for d/c- CSW updated RN to advise the attending.   [name], MSW, LCSW Clinical Social Worker    Expected Discharge Plan: Skilled Nursing Facility Barriers to Discharge: Continued Medical Work up  Expected Discharge Plan and Services Expected Discharge Plan: [place] arrangements for the past 2 months: Single Family Home                                       Social Determinants of Health (SDOH) Interventions    Readmission Risk Interventions     No data to display

## 2022-04-13 NOTE — Progress Notes (Signed)
PROGRESS NOTE    Ryan Garner  IRC:[id number] DOB: 02/27/1946 DOA: 04/04/2022 PCP: [name], [name], MD  No chief complaint on file.   Brief Narrative:  Patient 76 year old gentleman history of hypertension, hyperlipidemia, CAD with history of STEMI status post PCI, PAH, rate controlled Keir Viernes-fib on anticoagulation, type 2 diabetes, morbid obesity who was admitted to orthopedic service, per Dr. Sharol Given for left leg cellulitis and abscess.  Patient recently hospitalized from 8/10 to 8/14 for recurrent cellulitis of the left leg at [facility] and failed outpatient management with Augmentin.  Patient noted to have been discharged on Doxy following hospitalization wound got worse with redness swelling and pain seen by PCP placed on cefdinir and subsequently transition to Bactrim.  Despite oral antibiotics wound worsening patient developed an abscess seen in the office by Dr. Sharol Given on 911, admitted placed on IV Vanco Flagyl and Rocephin taken to the OR on 04/06/2022.  During induction for intubation patient became bradycardic hypotensive received CPR temporary placed on pressors stabilized and he proceeded with debridement surgery.  Postoperatively patient noted to have been weaned off pressors extubated successfully.  Patient with some substernal chest discomfort.  Chest x-ray noted hazy opacification of the right mid to lower lung with possible right-sided effusion.  Hospitalist consulted for hypotension and bradycardia.  Cardiology consulted.  ID consulted and following.    Assessment & Plan:   Principal Problem:   Abscess of leg, left Active Problems:   Atrial fibrillation (HCC)   Transient hypotension   Acute kidney injury superimposed on chronic kidney disease (HCC)   Hyponatremia   Bradycardia   (HFpEF) heart failure with preserved ejection fraction (HCC)   Hypotension   Nonrheumatic mitral (valve) insufficiency   Aortic valve stenosis, nonrheumatic   Assessment and Plan: #1 left lower  extremity cellulitis with abscess -Status post I and D per Dr. Sharol Given 04/06/2022 with placement of wound VAC. -Initial cultures from left leg debridement 04/04/2022 per ID growing rare Serratia marcescens. -MRSA PCR negative. -abx narrowed to doxycycline and levaquin per ID -> planning for 3 weeks from OR -Per primary team,/orthopedics, Dr. Sharol Given and per ID.     2.  Leukocytosis -Likely secondary to problem #1. -Continue IV antibiotics as directed by ID.   3.  Transient hypotension and bradycardia -Patient noted to be hypotensive and bradycardic for which CPR had to be started after patient given induction medications for intubation. -Patient extubated postprocedure, vasopressin weaned off. -Patient with complaints of chest soreness. -EKG with no significant changes. -Hypotension bradycardia likely secondary to induction of intubation. -Noted to have been on metoprolol 50 mg the morning prior to procedure. -Cardiac enzymes slightly elevated but flat. -Cardiology following, currently on metoprolol 25 mg BID   4.  Acute respiratory failure secondary to acute on chronic diastolic CHF -Patient with volume overload on examination with 3-4+ bilateral pitting lower extremity edema, crackles noted on examination. -echo with EF 50-55%, no RWMA, mild concentric LVH, IV septum flattened in systole and diastole, RVSF moderately reduced, severe MV regurgitation, moderate AV stenosis, IVC dilated with <50% resp variability -repeat echo 9/21 with severe MVR -Patient noted to be on Lasix 80 mg orally.82.5. -continue lasix 60 mg IV BID per cards -> planning to transition to PO as able, currently on high dose oral lasix per cardiology -net negative 28 L, weights appear unreliable  -Per cardiology, appreciate assistance - continue oral lasix, needs TEE, right/left cardiac cath   5.  Severe MVR/moderate AAS/PAH -repeat TTE shows severe MVR,  he'll need TEE, repeat right and L heart cath to characterize severity  and etiology -Per cardiology   # Atrial Fibrillation Continue metop and eliquis  6.  AKI superimposed on CKD stage IIIa  urinary retention -Creatinine noted to be trending up to 1.83 with Ryan Garner BUN of 47 early on in the hospitalization, prior creatinine noted at 1.2 on 03/03/2022. -stable renal function today -Renal ultrasound with bilateral renal cortical thinning consistent with chronic medical renal disease, no hydronephrosis -brisk UOP, >5 L -Patient was of urinary retention per RN on 04/07/2022 -- Foley catheter ordered and placed.  Foley d/c'd today, will follow trial of void. -consider trial of void within next few days  7.  Normocytic anemia Trend, relatively stable   8.  Hypokalemia Follow closely    9.  Hyponatremia Mild, follow with diuresis   10.  Hypomagnesemia Replace, follow     DVT prophylaxis: eliquis Code Status: full Family Communication: none Disposition:   Status is: Inpatient Remains inpatient appropriate because: continued need for inpatient care   Consultants:  Cardiology ID Dr. Sharol Given, orthopedics is primary  Procedures:  Echo IMPRESSIONS     1. Left ventricular ejection fraction, by estimation, is 50 to 55%. The  left ventricle has mildly decreased function. The left ventricle has no  regional wall motion abnormalities. There is mild concentric left  ventricular hypertrophy. Left ventricular  diastolic function could not be evaluated. There is the interventricular  septum is flattened in systole and diastole, consistent with right  ventricular pressure and volume overload.   2. Right ventricular systolic function is moderately reduced. The right  ventricular size is moderately enlarged. There is severely elevated  pulmonary artery systolic pressure. The estimated right ventricular  systolic pressure is 71.2 mmHg.   3. Left atrial size was severely dilated.   4. Right atrial size was severely dilated.   5. The mitral valve is grossly  normal. Severe mitral valve regurgitation.  Moderate mitral annular calcification.   6. The aortic valve is tricuspid. There is severe calcifcation of the  aortic valve. There is severe thickening of the aortic valve. Aortic valve  regurgitation is not visualized. Moderate aortic valve stenosis. Aortic  valve mean gradient measures 17.0  mmHg. Aortic valve Vmax measures 2.71 m/s. Aortic valve acceleration time  measures 120 msec.   7. Aortic dilatation noted. There is mild dilatation of the ascending  aorta, measuring 41 mm. There is mild dilatation of the aortic root,  measuring 39 mm.   8. The inferior vena cava is dilated in size with <50% respiratory  variability, suggesting right atrial pressure of 15 mmHg.   Comparison(s): Prior images reviewed side by side. The left ventricular  function is worsened. Mitral insufficiency is worse.   Antimicrobials:  Anti-infectives (From admission, onward)    Start     Dose/Rate Route Frequency Ordered Stop   04/13/22 0000  doxycycline (VIBRA-TABS) 100 MG tablet        100 mg Oral Every 12 hours 04/13/22 1408     04/13/22 0000  levofloxacin (LEVAQUIN) 750 MG tablet        750 mg Oral Daily at bedtime 04/13/22 1408     04/11/22 2200  levofloxacin (LEVAQUIN) tablet 750 mg        750 mg Oral Daily at bedtime 04/11/22 1022 04/27/22 2359   04/11/22 2200  doxycycline (VIBRA-TABS) tablet 100 mg        100 mg Oral Every 12 hours 04/11/22 1321 04/27/22 2359  04/10/22 2200  ceFEPIme (MAXIPIME) 2 g in sodium chloride 0.9 % 100 mL IVPB  Status:  Discontinued        2 g 200 mL/hr over 30 Minutes Intravenous Every 12 hours 04/10/22 1215 04/11/22 1022   04/09/22 2200  amoxicillin-clavulanate (AUGMENTIN) 875-125 MG per tablet 1 tablet  Status:  Discontinued        1 tablet Oral Every 12 hours 04/09/22 1229 04/09/22 1615   04/09/22 2200  doxycycline (VIBRA-TABS) tablet 100 mg  Status:  Discontinued        100 mg Oral Every 12 hours 04/09/22 1229 04/11/22  1022   04/09/22 2200  ceFEPIme (MAXIPIME) 2 g in sodium chloride 0.9 % 100 mL IVPB  Status:  Discontinued        2 g 200 mL/hr over 30 Minutes Intravenous Every 8 hours 04/09/22 1615 04/10/22 1215   04/06/22 0845  ceFAZolin (ANCEF) IVPB 3g/100 mL premix  Status:  Discontinued        3 g 200 mL/hr over 30 Minutes Intravenous On call to O.R. 04/06/22 0839 04/06/22 1153   04/06/22 0600  vancomycin (VANCOREADY) IVPB 1750 mg/350 mL  Status:  Discontinued        1,750 mg 175 mL/hr over 120 Minutes Intravenous Every 36 hours 04/04/22 1602 04/05/22 1121   04/05/22 2000  metroNIDAZOLE (FLAGYL) tablet 500 mg  Status:  Discontinued        500 mg Oral Every 12 hours 04/05/22 1021 04/09/22 1229   04/05/22 1630  vancomycin (VANCOCIN) IVPB 1000 mg/200 mL premix  Status:  Discontinued        1,000 mg 200 mL/hr over 60 Minutes Intravenous Every 24 hours 04/05/22 1121 04/09/22 1229   04/04/22 1400  cefTRIAXone (ROCEPHIN) 2 g in sodium chloride 0.9 % 100 mL IVPB  Status:  Discontinued        2 g 200 mL/hr over 30 Minutes Intravenous Every 24 hours 04/04/22 1218 04/09/22 1229   04/04/22 1400  metroNIDAZOLE (FLAGYL) IVPB 500 mg  Status:  Discontinued        500 mg 100 mL/hr over 60 Minutes Intravenous Every 8 hours 04/04/22 1218 04/05/22 1021   04/04/22 1400  vancomycin (VANCOCIN) 2,500 mg in sodium chloride 0.9 % 500 mL IVPB        2,500 mg 262.5 mL/hr over 120 Minutes Intravenous  Once 04/04/22 1312 04/04/22 1800   04/04/22 0800  ceFAZolin (ANCEF) IVPB 3g/100 mL premix        3 g 200 mL/hr over 30 Minutes Intravenous On call to O.R. 04/04/22 0746 04/04/22 1002       Subjective: No new complaints  Objective: Vitals:   04/12/22 2335 04/13/22 0314 04/13/22 0316 04/13/22 0748  BP: (!) 98/55 110/74  114/76  Pulse: 96 87  94  Resp: '18 20  20  '$ Temp: 98.4 F (36.9 C) 99.4 F (37.4 C)  98.3 F (36.8 C)  TempSrc: Oral Oral  Oral  SpO2: 91% 95%  99%  Weight:   126.2 kg   Height:         Intake/Output Summary (Last 24 hours) at 04/13/2022 1716 Last data filed at 04/13/2022 1600 Gross per 24 hour  Intake 480 ml  Output 2925 ml  Net -2445 ml   Filed Weights   04/11/22 0429 04/12/22 0325 04/13/22 0316  Weight: 130.8 kg 132 kg 126.2 kg    Examination:  General: No acute distress. Cardiovascular: RRR Lungs: unlabored Abdomen: Soft, nontender, nondistended Neurological: Alert  and oriented 3. Moves all extremities 4 with equal strength. Cranial nerves II through XII grossly intact. Skin: Warm and dry. No rashes or lesions. Extremities: RLE edema, LLE wound vac  Data Reviewed: I have personally reviewed following labs and imaging studies  CBC: Recent Labs  Lab 04/08/22 0125 04/09/22 1125 04/10/22 0429 04/11/22 0321 04/12/22 0818 04/13/22 0334  WBC 10.9* 11.9* 15.1* 13.6* 11.6* 10.4  NEUTROABS 8.2* 9.7* 12.5* 10.2*  --  7.3  HGB 10.9* 11.8* 11.7* 10.2* 10.4* 10.8*  HCT 32.5* 34.3* 34.8* 29.4* 30.6* 31.5*  MCV 92.9 90.7 92.3 91.6 91.9 91.6  PLT 200 237 252 209 217 676    Basic Metabolic Panel: Recent Labs  Lab 04/08/22 0125 04/09/22 1125 04/10/22 0429 04/11/22 0321 04/12/22 0818 04/13/22 0334  NA 129* 134* 135 136 133* 133*  K 4.1 3.7 3.7 3.2* 3.1* 3.6  CL 97* 96* 96* 96* 94* 93*  CO2 '22 27 28 28 31 29  '$ GLUCOSE 130* 164* 139* 115* 123* 118*  BUN 51* 41* 34* 34* 33* 33*  CREATININE 1.56* 1.22 1.15 1.01 1.06 1.06  CALCIUM 9.1 9.3 9.0 9.2 8.7* 8.8*  MG 1.9 1.4* 1.7 1.7  --  1.5*  PHOS 3.5 3.1 2.9  --   --  2.2*    GFR: Estimated Creatinine Clearance: 74.3 mL/min (by C-G formula based on SCr of 1.06 mg/dL).  Liver Function Tests: Recent Labs  Lab 04/08/22 0125 04/09/22 1125 04/10/22 0429 04/12/22 0818 04/13/22 0334  AST  --   --   --  16 18  ALT  --   --   --  10 10  ALKPHOS  --   --   --  55 64  BILITOT  --   --   --  1.4* 1.2  PROT  --   --   --  5.6* 5.6*  ALBUMIN 2.9* 2.7* 2.6* 2.6* 2.6*    CBG: No results for input(s):  "GLUCAP" in the last 168 hours.    Recent Results (from the past 240 hour(s))  Aerobic/Anaerobic Culture w Gram Stain (surgical/deep wound)     Status: None   Collection Time: 04/04/22  9:44 AM   Specimen: PATH Soft tissue  Result Value Ref Range Status   Specimen Description LEG  Final   Special Requests  LEFT DEBRIDEMENT  Final   Gram Stain NO WBC SEEN NO ORGANISMS SEEN   Final   Culture   Final    RARE SERRATIA MARCESCENS NO ANAEROBES ISOLATED Performed at [facility], [place] [address] [place], [place] [address]    Report Status 04/11/2022 FINAL  Final   Organism ID, Bacteria SERRATIA MARCESCENS  Final      Susceptibility   Serratia marcescens - MIC*    CEFAZOLIN >=64 RESISTANT Resistant     CEFEPIME <=0.12 SENSITIVE Sensitive     CEFTAZIDIME <=1 SENSITIVE Sensitive     CEFTRIAXONE <=0.25 SENSITIVE Sensitive     CIPROFLOXACIN <=0.25 SENSITIVE Sensitive     GENTAMICIN <=1 SENSITIVE Sensitive     TRIMETH/SULFA <=20 SENSITIVE Sensitive     * RARE SERRATIA MARCESCENS  Surgical pcr screen     Status: None   Collection Time: 04/05/22  8:11 PM   Specimen: Nasal Mucosa; Nasal Swab  Result Value Ref Range Status   MRSA, PCR NEGATIVE NEGATIVE Final   Staphylococcus aureus NEGATIVE NEGATIVE Final    Comment: (NOTE) The Xpert SA Assay (FDA approved for NASAL specimens in patients 22 years of  age and older), is one component of Trinette Vera comprehensive surveillance program. It is not intended to diagnose infection nor to guide or monitor treatment. Performed at [facility], [place] [address], [place], [place] [address]   MRSA Next Gen by PCR, Nasal     Status: None   Collection Time: 04/06/22 12:25 PM   Specimen: Nasal Mucosa; Nasal Swab  Result Value Ref Range Status   MRSA by PCR Next Gen NOT DETECTED NOT DETECTED Final    Comment: (NOTE) The GeneXpert MRSA Assay (FDA approved for NASAL specimens only), is one component of Dontarious Schaum comprehensive MRSA colonization  surveillance program. It is not intended to diagnose MRSA infection nor to guide or monitor treatment for MRSA infections. Test performance is not FDA approved in patients less than 36 years old. Performed at [facility], [place] [address], [place],  [address]          Radiology Studies: ECHOCARDIOGRAM LIMITED  Result Date: 04/12/2022    ECHOCARDIOGRAM LIMITED REPORT   Patient Name:   Ryan Garner Date of Exam: 04/12/2022 Medical Rec #:  [id number]      Height:       68.0 in Accession #:    [id number]     Weight:       291.1 lb Date of Birth:  [birth year]       BSA:          2.397 m Patient Age:    21 years       BP:           107/60 mmHg Patient Gender: M              HR:           89 bpm. Exam Location:  Inpatient Procedure: Limited Echo, Cardiac Doppler and Color Doppler Indications:    Mitral valve insufficiency  History:        Patient has prior history of Echocardiogram examinations, most                 recent 04/07/2022. CHF, Previous Myocardial Infarction and CAD,                 Arrythmias:Atrial Fibrillation, Signs/Symptoms:Dyspnea; Risk                 Factors:Sleep Apnea, Hypertension, Dyslipidemia and Diabetes.  Sonographer:    Eartha Inch Referring Phys: [id number] [place]  Sonographer Comments: Technically challenging study due to limited acoustic windows. Image acquisition challenging due to patient body habitus. IMPRESSIONS  1. Very limited study to evaluated mitral regurgitation. Severe mitral valve regurgitation appears to be related to restricted PMVL. Jet is posteriorly directed. Unchanged from prior. The mitral valve is abnormal. Severe mitral valve regurgitation. No evidence of mitral stenosis.  2. Left ventricular ejection fraction, by estimation, is 50 to 55%. The left ventricle has low normal function. The left ventricle demonstrates global hypokinesis. There is the interventricular septum is flattened in systole and diastole, consistent with right  ventricular pressure and volume overload.  3. Right ventricular systolic function was not well visualized. The right ventricular size is not well visualized. Comparison(s): No significant change from prior study. FINDINGS  Left Ventricle: Left ventricular ejection fraction, by estimation, is 50 to 55%. The left ventricle has low normal function. The left ventricle demonstrates global hypokinesis. The interventricular septum is flattened in systole and diastole, consistent  with right ventricular pressure and volume overload. Right Ventricle: The right ventricular  size is not well visualized. Right vetricular wall thickness was not well visualized. Right ventricular systolic function was not well visualized. Pericardium: There is no evidence of pericardial effusion. Mitral Valve: Very limited study to evaluated mitral regurgitation. Severe mitral valve regurgitation appears to be related to restricted PMVL. Jet is posteriorly directed. Unchanged from prior. The mitral valve is abnormal. Severe mitral valve regurgitation. No evidence of mitral valve stenosis. MR Peak grad:    86.1 mmHg MR Mean grad:    63.0 mmHg MR Vmax:         464.00 cm/s MR Vmean:        380.0 cm/s MR PISA:         1.57 cm MR PISA Eff ROA: 15 mm MR PISA Radius:  0.50 cm Eleonore Chiquito MD Electronically signed by Eleonore Chiquito MD Signature Date/Time: 04/12/2022/1:38:43 PM    Final         Scheduled Meds:  apixaban  5 mg Oral BID   vitamin C  1,000 mg Oral Daily   atorvastatin  80 mg Oral Daily   Chlorhexidine Gluconate Cloth  6 each Topical Daily   clopidogrel  75 mg Oral Q breakfast   docusate sodium  100 mg Oral BID   doxycycline  100 mg Oral Q12H   furosemide  80 mg Oral BID   levofloxacin  750 mg Oral QHS   metoprolol tartrate  25 mg Oral BID   nutrition supplement (JUVEN)  1 packet Oral BID BM   pantoprazole  40 mg Oral Daily   potassium chloride  20 mEq Oral TID   zinc sulfate  220 mg Oral Daily   Continuous Infusions:   methocarbamol (ROBAXIN) IV       LOS: 9 days    Time spent: over 30 min    Fayrene Helper, MD [facility]   To contact the attending provider between 7A-7P or the covering provider during after hours 7P-7A, please log into the web site [email or web address] and access using universal [place] password for that web site. If you do not have the password, please call the hospital operator.  04/13/2022, 5:16 PM

## 2022-04-13 NOTE — Progress Notes (Signed)
Physical Therapy Treatment Patient Details Name: Ryan Garner MRN: [id number] DOB: 26-Jul-1945 Today's Date: 04/13/2022   History of Present Illness 76 yo male admitted with chronic abscess of Lt tibia due to an injury about a year ago when his leg was pinned between a bobcat blade and a truck. Pt s/p LLE I&D 9/13 and 9/15 with VAC. 9/15 during intubation pt with bradycardia and hypotension.  PMH: afib, CHF,  DM, HLD, HTN, STEMI, rotator cuff repair    PT Comments    Pt up in chair on arrival and motivated for mobility. Pt able to come to standing from recliner with min assist to RW and complete gait bout with RW and min assist with fair stability and no LOB. RW switched for EVA upright rollator at pt request and pt with increased gait speed, however continuing to require min assist for navigation and upright rollator management.  Session limited as pt with bowel urgency. Current plan remains appropriate to address deficits and maximize functional independence and decrease caregiver burden. Pt continues to benefit from skilled PT services to progress toward functional mobility goals.    Recommendations for follow up therapy are one component of a multi-disciplinary discharge planning process, led by the attending physician.  Recommendations may be updated based on patient status, additional functional criteria and insurance authorization.  Follow Up Recommendations  Skilled nursing-short term rehab (<3 hours/day) Can patient physically be transported by private vehicle: No   Assistance Recommended at Discharge Frequent or constant Supervision/Assistance  Patient can return home with the following A lot of help with walking and/or transfers;A lot of help with bathing/dressing/bathroom;Assistance with cooking/housework;Assist for transportation;Help with stairs or ramp for entrance   Equipment Recommendations  Rolling walker (2 wheels);BSC/3in1    Recommendations for Other Services        Precautions / Restrictions Precautions Precautions: Fall Precaution Comments: wound vac LLE Restrictions Weight Bearing Restrictions: Yes LLE Weight Bearing: Weight bearing as tolerated     Mobility  Bed Mobility Overal bed mobility: Needs Assistance             General bed mobility comments: up in recliner upon arrival    Transfers Overall transfer level: Needs assistance Equipment used: Rolling walker (2 wheels) (eva walker) Transfers: Sit to/from Stand Sit to Stand: Min assist           General transfer comment: light min a to power up and steady on rise    Ambulation/Gait Ambulation/Gait assistance: Min assist Gait Distance (Feet): 150 Feet (+ 20' with RW) Assistive device: Ethelene Hal, Rolling walker (2 wheels) Gait Pattern/deviations: Step-through pattern, Decreased stride length, Trunk flexed, Antalgic Gait velocity: decreased     General Gait Details: slow gati with EVA walker and RW, cues for upright posture and forward gaze, better foot clearnce without cues needed this session, min assist for navigation as pt drifting to R   Stairs             Wheelchair Mobility    Modified Rankin (Stroke Patients Only)       Balance Overall balance assessment: Needs assistance Sitting-balance support: No upper extremity supported, Feet supported Sitting balance-Leahy Scale: Fair Sitting balance - Comments: sitting EOB without support   Standing balance support: Reliant on assistive device for balance, Bilateral upper extremity supported Standing balance-Leahy Scale: Poor Standing balance comment: reliant on AD in standing  Cognition Arousal/Alertness: Awake/alert Behavior During Therapy: Flat affect Overall Cognitive Status: Within Functional Limits for tasks assessed                                 General Comments: pt motivated and particiapatory        Exercises      General Comments  General comments (skin integrity, edema, etc.): VSS on RA      Pertinent Vitals/Pain Pain Assessment Pain Assessment: No/denies pain Pain Intervention(s): Monitored during session    Home Living                          Prior Function            PT Goals (current goals can now be found in the care plan section) Acute Rehab PT Goals PT Goal Formulation: With patient Time For Goal Achievement: 04/19/22    Frequency    Min 3X/week      PT Plan Current plan remains appropriate    Co-evaluation              AM-PAC PT "6 Clicks" Mobility   Outcome Measure  Help needed turning from your back to your side while in a flat bed without using bedrails?: A Lot Help needed moving from lying on your back to sitting on the side of a flat bed without using bedrails?: A Lot Help needed moving to and from a bed to a chair (including a wheelchair)?: A Lot Help needed standing up from a chair using your arms (e.g., wheelchair or bedside chair)?: A Little Help needed to walk in hospital room?: A Little Help needed climbing 3-5 steps with a railing? : Total 6 Click Score: 13    End of Session   Activity Tolerance: Patient tolerated treatment well Patient left: with call bell/phone within reach;Other (comment) (on BSC, pt aware to use call bell when done, RN and NT aware of pt location) Nurse Communication: Mobility status PT Visit Diagnosis: Other abnormalities of gait and mobility (R26.89);Pain;Muscle weakness (generalized) (M62.81) Pain - Right/Left: Left Pain - part of body: Leg     Time: 9169-4503 PT Time Calculation (min) (ACUTE ONLY): 28 min  Charges:  $Gait Training: 23-37 mins                    Wacey Zieger R. PTA Acute Rehabilitation Services Office: [place] 04/13/2022, 11:47 AM

## 2022-04-13 NOTE — Progress Notes (Addendum)
Mobility Specialist Progress Note:   04/13/22 1159  Mobility  Activity Transferred to/from [facility]  Level of Assistance Standby assist, set-up cues, supervision of patient - no hands on  Assistive Device BSC;Front wheel walker  Distance Ambulated (ft) 4 ft  Activity Response Tolerated well  $Mobility charge 1 Mobility   Pt received on BSC needing to get back to chair. Required peri-care. Left in chair with call bell in reach and all needs met.   [facility] Surveyor, mining Chat only

## 2022-04-13 NOTE — Progress Notes (Addendum)
Rounding Note    Patient Name: Ryan Garner Date of Encounter: 04/13/2022  [place] Cardiologist: Dorris Carnes, MD   Subjective   No acute overnight events. He is doing well this morning. He has had chronic shortness of breath for 2 year and this is stable - maybe slightly better than usual. No orthopnea. He still has some mild chest soreness from chest compressions but no other chest pain. He has permanent atrial fibrillation and notes occasional heart racing that is short lived.  Inpatient Medications    Scheduled Meds:  apixaban  5 mg Oral BID   vitamin C  1,000 mg Oral Daily   atorvastatin  80 mg Oral Daily   Chlorhexidine Gluconate Cloth  6 each Topical Daily   clopidogrel  75 mg Oral Q breakfast   docusate sodium  100 mg Oral BID   doxycycline  100 mg Oral Q12H   furosemide  80 mg Oral BID   levofloxacin  750 mg Oral QHS   metoprolol tartrate  25 mg Oral BID   nutrition supplement (JUVEN)  1 packet Oral BID BM   pantoprazole  40 mg Oral Daily   potassium chloride  20 mEq Oral TID   zinc sulfate  220 mg Oral Daily   Continuous Infusions:  magnesium sulfate bolus IVPB     methocarbamol (ROBAXIN) IV     PRN Meds: acetaminophen, alum & mag hydroxide-simeth, bisacodyl, bisacodyl, guaiFENesin-dextromethorphan, hydrALAZINE, HYDROmorphone (DILAUDID) injection, labetalol, levalbuterol, magnesium citrate, methocarbamol **OR** methocarbamol (ROBAXIN) IV, metoCLOPramide **OR** metoCLOPramide (REGLAN) injection, metoprolol tartrate, ondansetron **OR** ondansetron (ZOFRAN) IV, oxyCODONE, oxyCODONE, phenol, polyethylene glycol   Vital Signs    Vitals:   04/12/22 2335 04/13/22 0314 04/13/22 0316 04/13/22 0748  BP: (!) 98/55 110/74  114/76  Pulse: 96 87  94  Resp: '18 20  20  '$ Temp: 98.4 F (36.9 C) 99.4 F (37.4 C)  98.3 F (36.8 C)  TempSrc: Oral Oral  Oral  SpO2: 91% 95%  99%  Weight:   126.2 kg   Height:        Intake/Output Summary (Last 24 hours) at  04/13/2022 1008 Last data filed at 04/13/2022 0654 Gross per 24 hour  Intake 480 ml  Output 2450 ml  Net -1970 ml      04/13/2022    3:16 AM 04/12/2022    3:25 AM 04/11/2022    4:29 AM  Last 3 Weights  Weight (lbs) 278 lb 3.5 oz 291 lb 1.6 oz 288 lb 6.4 oz  Weight (kg) 126.2 kg 132.042 kg 130.817 kg      Telemetry    Atrial fibrillation with rates mostly in the 90s. Occasionally briefly in the 110s to 120s. Some PVCs noted. - Personally Reviewed  ECG    No new ECG tracing today. - Personally Reviewed  Physical Exam   GEN: No acute distress.   Neck: No JVD. Cardiac: Irregularly irregular rhythm with normal rate. No significant murmurs noted on exam today. Respiratory: No increased work of breathing. Diminished breath sounds in bilateral bases but no significant wheezes, rhonchi, or rales. GI: Soft, non-distended, and non-tender. MS: 3+ pitting edema of right lower extremity, 2+ edema of left lower extremity which is wrapped with ACE wrap.  No deformity. Skin: Warm and dry. Neuro:  No focal deficits. Psych: Normal affect. Responds appropriately.  Labs    High Sensitivity Troponin:   Recent Labs  Lab 04/06/22 1253 04/06/22 1451  TROPONINIHS 21* 27*     Chemistry  Recent Labs  Lab 04/10/22 0429 04/11/22 0321 04/12/22 0818 04/13/22 0334  NA 135 136 133* 133*  K 3.7 3.2* 3.1* 3.6  CL 96* 96* 94* 93*  CO2 '28 28 31 29  '$ GLUCOSE 139* 115* 123* 118*  BUN 34* 34* 33* 33*  CREATININE 1.15 1.01 1.06 1.06  CALCIUM 9.0 9.2 8.7* 8.8*  MG 1.7 1.7  --  1.5*  PROT  --   --  5.6* 5.6*  ALBUMIN 2.6*  --  2.6* 2.6*  AST  --   --  16 18  ALT  --   --  10 10  ALKPHOS  --   --  55 64  BILITOT  --   --  1.4* 1.2  GFRNONAA >60 >60 >60 >60  ANIONGAP '11 12 8 11    '$ Lipids No results for input(s): "CHOL", "TRIG", "HDL", "LABVLDL", "LDLCALC", "CHOLHDL" in the last 168 hours.  Hematology Recent Labs  Lab 04/11/22 0321 04/12/22 0818 04/13/22 0334  WBC 13.6* 11.6* 10.4  RBC 3.21*  3.33* 3.44*  HGB 10.2* 10.4* 10.8*  HCT 29.4* 30.6* 31.5*  MCV 91.6 91.9 91.6  MCH 31.8 31.2 31.4  MCHC 34.7 34.0 34.3  RDW 15.2 15.0 14.8  PLT 209 217 230   Thyroid No results for input(s): "TSH", "FREET4" in the last 168 hours.  BNP Recent Labs  Lab 04/06/22 1253  BNP 682.5*    DDimer No results for input(s): "DDIMER" in the last 168 hours.   Radiology    ECHOCARDIOGRAM LIMITED  Result Date: 04/12/2022    ECHOCARDIOGRAM LIMITED REPORT   Patient Name:   ISABELLA IDA Date of Exam: 04/12/2022 Medical Rec #:  [id number]      Height:       68.0 in Accession #:    [id number]     Weight:       291.1 lb Date of Birth:  02/29/1946       BSA:          2.397 m Patient Age:    66 years       BP:           107/60 mmHg Patient Gender: M              HR:           89 bpm. Exam Location:  Inpatient Procedure: Limited Echo, Cardiac Doppler and Color Doppler Indications:    Mitral valve insufficiency  History:        Patient has prior history of Echocardiogram examinations, most                 recent 04/07/2022. CHF, Previous Myocardial Infarction and CAD,                 Arrythmias:Atrial Fibrillation, Signs/Symptoms:Dyspnea; Risk                 Factors:Sleep Apnea, Hypertension, Dyslipidemia and Diabetes.  Sonographer:    Eartha Inch Referring Phys: [id number] [place]  Sonographer Comments: Technically challenging study due to limited acoustic windows. Image acquisition challenging due to patient body habitus. IMPRESSIONS  1. Very limited study to evaluated mitral regurgitation. Severe mitral valve regurgitation appears to be related to restricted PMVL. Jet is posteriorly directed. Unchanged from prior. The mitral valve is abnormal. Severe mitral valve regurgitation. No evidence of mitral stenosis.  2. Left ventricular ejection fraction, by estimation, is 50 to 55%. The left ventricle has low normal function. The left ventricle demonstrates  global hypokinesis. There is the interventricular septum  is flattened in systole and diastole, consistent with right ventricular pressure and volume overload.  3. Right ventricular systolic function was not well visualized. The right ventricular size is not well visualized. Comparison(s): No significant change from prior study. FINDINGS  Left Ventricle: Left ventricular ejection fraction, by estimation, is 50 to 55%. The left ventricle has low normal function. The left ventricle demonstrates global hypokinesis. The interventricular septum is flattened in systole and diastole, consistent  with right ventricular pressure and volume overload. Right Ventricle: The right ventricular size is not well visualized. Right vetricular wall thickness was not well visualized. Right ventricular systolic function was not well visualized. Pericardium: There is no evidence of pericardial effusion. Mitral Valve: Very limited study to evaluated mitral regurgitation. Severe mitral valve regurgitation appears to be related to restricted PMVL. Jet is posteriorly directed. Unchanged from prior. The mitral valve is abnormal. Severe mitral valve regurgitation. No evidence of mitral valve stenosis. MR Peak grad:    86.1 mmHg MR Mean grad:    63.0 mmHg MR Vmax:         464.00 cm/s MR Vmean:        380.0 cm/s MR PISA:         1.57 cm MR PISA Eff ROA: 15 mm MR PISA Radius:  0.50 cm Eleonore Chiquito MD Electronically signed by Eleonore Chiquito MD Signature Date/Time: 04/12/2022/1:38:43 PM    Final     Cardiac Studies   Echocardiogram 04/07/2022: Impressions: 1. Left ventricular ejection fraction, by estimation, is 50 to 55%. The  left ventricle has mildly decreased function. The left ventricle has no  regional wall motion abnormalities. There is mild concentric left  ventricular hypertrophy. Left ventricular  diastolic function could not be evaluated. There is the interventricular  septum is flattened in systole and diastole, consistent with right  ventricular pressure and volume overload.   2.  Right ventricular systolic function is moderately reduced. The right  ventricular size is moderately enlarged. There is severely elevated  pulmonary artery systolic pressure. The estimated right ventricular  systolic pressure is 29.4 mmHg.   3. Left atrial size was severely dilated.   4. Right atrial size was severely dilated.   5. The mitral valve is grossly normal. Severe mitral valve regurgitation.  Moderate mitral annular calcification.   6. The aortic valve is tricuspid. There is severe calcifcation of the  aortic valve. There is severe thickening of the aortic valve. Aortic valve  regurgitation is not visualized. Moderate aortic valve stenosis. Aortic  valve mean gradient measures 17.0  mmHg. Aortic valve Vmax measures 2.71 m/s. Aortic valve acceleration time  measures 120 msec.   7. Aortic dilatation noted. There is mild dilatation of the ascending  aorta, measuring 41 mm. There is mild dilatation of the aortic root,  measuring 39 mm.   8. The inferior vena cava is dilated in size with <50% respiratory  variability, suggesting right atrial pressure of 15 mmHg.   Comparison(s): Prior images reviewed side by side. The left ventricular  function is worsened. Mitral insufficiency is worse.    Patient Profile     76 y.o. male with a history of CAD s/p STEMI in 2021 s/p DES to LCX, chronic diastolic CHF paroxysmal atrial fibrillation on Eliquis, hypertension, hyperlipidemia, type 2 diabetes mellitus, sleep apnea who presented to [facility] on 04/04/2022 for left leg excision debridement of a chronic abscess. Immediately after intubation, patient became bradycardic and hypotension but no mention  of loss of pulses. He underwent resuscitation and responded very well. Decision was made to proceed with procedure and Cardiology was consulted afterwards for further evaluation.  Assessment & Plan    Episode of Bradycardia and Hypotension Following intubation and induction of anesthesia.  Received transient resuscitative efforts in the OR and was able to complete operation. - No recurrent events.  - Tolerating Lopressor '25mg'$  twice daily well.    Acute on Chronic Diastolic CHF BNP elevated at 682. Chest x-ray showed mild stable cardiomegaly with hazy opacification over the right mid to lower lung which may be due to atelectasis or infection as well as possible small right pleural effusion. Echo showed LVEF of 50-55% with no regional wall motion abnormalities, moderately enlarged RV with moderately reduced systolic function, severe biatrial enlargement, severe MR, moderate AS, and severe pulmonary hypertension with RVSP of 85.2 mmHg. Initially diuresed with IV Lasix but switched to PO Lasix yesterday. Documented urinary output of 5.35 mL in the last 24 hours and net negative 26.8 L this admission. Weight down 18 lbs since admission if recorder weights are accurate. Renal function stable.  - Still has significant lower extremity edema but responding well to oral Lasix. - Continue PO Lasix '80mg'$  twice daily. - BP soft at times so not adding Spironolactone.  - Would consider adding SGLT2 inhibitor though. - Discussed the importance of daily weights and sodium/fluid restrictions after discharge. - Driving factor appears to be valvular heart disease. See below.   Severe Mitral Regurgitation Moderate Aortic Stenosis Noted on Echo this admission. Severe MR was a new finding. Per MD, transthoracic echo image quality is not great and is difficult to establish the etiology. Differential diagnosis includes endocarditis (possible skin source), ischemic tethering, and even transient myocardial stunning following brief arrest.  Repeat limited Echo on 9/21 showed severe MR related to restricted PMVL with posterior directed jet unchanged from prior exam. - Continue medical therapy for now. - Will ultimately need TEE and right/left cardiac catheterization for work-up of mitral valve repair after acute  infection has been treated.  Demand Ischemia CAD History of STEMI s/p DES to LCX in 2021. High-sensitivity troponin minimally elevated at 21 >> 27 consistent with demand ischemia in setting of acute CHF and brief arrest. - He has some mild chest soreness from brief CPR but no other chest pain.  - Continue Plavix '75mg'$  daily. Not on aspirin due to need for DOAC. - Continue beta-blocker and high-intensity statin.  Severe Pulmonary Hypertension Echo this admission showed RVSP of 85.2 mmHg with moderately reduced RV function. Felt to largely be due to left heart disease and probably also due to obstructive sleep apnea.  - Will ultimately need R/LHC after treatment of infection.   Permanent Atrial Fibrillation Rates mostly in the 90s. - Continue Lopressor '25mg'$  twice daily. Previously on '50mg'$  twice daily at home. Will hold off on up-titrating this due to soft BP at times. - Continue Eliquis '5mg'$  twice daily.   Hyperlipidemia - Continue Lipitor '80mg'$  daily.   Type 2 Diabetes Mellitus - Management per primary team.   Acute on CKD Stage III Creatinine 1.71 on admission and peaked at 1.84. Baseline around 1.2 to 1.5.  - Improving with diuresis. Creatinine 1.06 today. - Continue to monitor. - Would repeat BMET in 1 week at SNF.   Hyponatremia  Sodium as low as 127 on 9/15.  - Improved with diuresis and stable at 133 today.  Hypomagnesemia Magnesium 1.5 today. - Will replete.  - If patient  is discharged today. Would recommend rechecking in 1 week at SNF.   Cellulitis Leg Abscess S/p debridement on 9/13. Of note, culture of abscess grew Serratia.  - Management per primary team.  Disposition: Primary team is hoping to discharge today. Will arrange follow-up visit in our office in about 2 weeks.  For questions or updates, please contact [place] Please consult [email or web address] for contact info under        Signed, Darreld Mclean, PA-C  04/13/2022, 10:08 AM     I have  seen and examined the patient along with Darreld Mclean, PA-C .  I have reviewed the chart, notes and new data.  I agree with PA/NP's note.  Key new complaints: continues with good diuresis and weight reduction after switching to PO meds Key examination changes: still edematous, apical holosystolic murmur is louder Key new findings / data: Na 133, normal K, creatinine OK  PLAN: Will need to have frequent monitoring of electrolytes and renal function as long as he is receiving high dose diuretics. Will need outpatient TEE and right and left heart catheterization for severe MR, preferably after leg abscess is healed.  Sanda Klein, MD, [place] [phone] 04/13/2022, 1:31 PM

## 2022-04-14 DIAGNOSIS — I5030 Unspecified diastolic (congestive) heart failure: Secondary | ICD-10-CM

## 2022-04-14 DIAGNOSIS — I2510 Atherosclerotic heart disease of native coronary artery without angina pectoris: Secondary | ICD-10-CM | POA: Diagnosis not present

## 2022-04-14 DIAGNOSIS — E039 Hypothyroidism, unspecified: Secondary | ICD-10-CM | POA: Diagnosis not present

## 2022-04-14 DIAGNOSIS — D649 Anemia, unspecified: Secondary | ICD-10-CM | POA: Diagnosis not present

## 2022-04-14 DIAGNOSIS — L03116 Cellulitis of left lower limb: Secondary | ICD-10-CM | POA: Diagnosis not present

## 2022-04-14 DIAGNOSIS — E559 Vitamin D deficiency, unspecified: Secondary | ICD-10-CM | POA: Diagnosis not present

## 2022-04-14 DIAGNOSIS — E119 Type 2 diabetes mellitus without complications: Secondary | ICD-10-CM | POA: Diagnosis not present

## 2022-04-14 DIAGNOSIS — N1831 Chronic kidney disease, stage 3a: Secondary | ICD-10-CM | POA: Diagnosis not present

## 2022-04-14 DIAGNOSIS — Z9189 Other specified personal risk factors, not elsewhere classified: Secondary | ICD-10-CM | POA: Diagnosis not present

## 2022-04-14 DIAGNOSIS — N179 Acute kidney failure, unspecified: Secondary | ICD-10-CM | POA: Diagnosis not present

## 2022-04-14 DIAGNOSIS — R5381 Other malaise: Secondary | ICD-10-CM | POA: Diagnosis not present

## 2022-04-14 DIAGNOSIS — Z743 Need for continuous supervision: Secondary | ICD-10-CM | POA: Diagnosis not present

## 2022-04-14 DIAGNOSIS — Z9889 Other specified postprocedural states: Secondary | ICD-10-CM | POA: Diagnosis not present

## 2022-04-14 DIAGNOSIS — R001 Bradycardia, unspecified: Secondary | ICD-10-CM | POA: Diagnosis not present

## 2022-04-14 DIAGNOSIS — L02416 Cutaneous abscess of left lower limb: Secondary | ICD-10-CM | POA: Diagnosis not present

## 2022-04-14 DIAGNOSIS — I340 Nonrheumatic mitral (valve) insufficiency: Secondary | ICD-10-CM | POA: Diagnosis not present

## 2022-04-14 DIAGNOSIS — I5033 Acute on chronic diastolic (congestive) heart failure: Secondary | ICD-10-CM | POA: Diagnosis not present

## 2022-04-14 DIAGNOSIS — I350 Nonrheumatic aortic (valve) stenosis: Secondary | ICD-10-CM | POA: Diagnosis not present

## 2022-04-14 DIAGNOSIS — I482 Chronic atrial fibrillation, unspecified: Secondary | ICD-10-CM | POA: Diagnosis not present

## 2022-04-14 DIAGNOSIS — N189 Chronic kidney disease, unspecified: Secondary | ICD-10-CM | POA: Diagnosis not present

## 2022-04-14 DIAGNOSIS — Z48817 Encounter for surgical aftercare following surgery on the skin and subcutaneous tissue: Secondary | ICD-10-CM | POA: Diagnosis not present

## 2022-04-14 DIAGNOSIS — E785 Hyperlipidemia, unspecified: Secondary | ICD-10-CM | POA: Diagnosis not present

## 2022-04-14 DIAGNOSIS — I4891 Unspecified atrial fibrillation: Secondary | ICD-10-CM | POA: Diagnosis not present

## 2022-04-14 DIAGNOSIS — R52 Pain, unspecified: Secondary | ICD-10-CM | POA: Diagnosis not present

## 2022-04-14 DIAGNOSIS — R5383 Other fatigue: Secondary | ICD-10-CM | POA: Diagnosis not present

## 2022-04-14 DIAGNOSIS — I959 Hypotension, unspecified: Secondary | ICD-10-CM | POA: Diagnosis not present

## 2022-04-14 DIAGNOSIS — R600 Localized edema: Secondary | ICD-10-CM | POA: Diagnosis not present

## 2022-04-14 DIAGNOSIS — R4182 Altered mental status, unspecified: Secondary | ICD-10-CM | POA: Diagnosis not present

## 2022-04-14 DIAGNOSIS — E7800 Pure hypercholesterolemia, unspecified: Secondary | ICD-10-CM | POA: Diagnosis not present

## 2022-04-14 DIAGNOSIS — D518 Other vitamin B12 deficiency anemias: Secondary | ICD-10-CM | POA: Diagnosis not present

## 2022-04-14 LAB — COMPREHENSIVE METABOLIC PANEL
ALT: 13 U/L (ref 0–44)
AST: 23 U/L (ref 15–41)
Albumin: 2.7 g/dL — ABNORMAL LOW (ref 3.5–5.0)
Alkaline Phosphatase: 61 U/L (ref 38–126)
Anion gap: 11 (ref 5–15)
BUN: 33 mg/dL — ABNORMAL HIGH (ref 8–23)
CO2: 25 mmol/L (ref 22–32)
Calcium: 8.9 mg/dL (ref 8.9–10.3)
Chloride: 97 mmol/L — ABNORMAL LOW (ref 98–111)
Creatinine, Ser: 1.13 mg/dL (ref 0.61–1.24)
GFR, Estimated: >60 mL/min (ref >60)
Glucose, Bld: 124 mg/dL — ABNORMAL HIGH (ref 70–99)
Potassium: 3.9 mmol/L (ref 3.5–5.1)
Sodium: 133 mmol/L — ABNORMAL LOW (ref 135–145)
Total Bilirubin: 0.8 mg/dL (ref 0.3–1.2)
Total Protein: 5.8 g/dL — ABNORMAL LOW (ref 6.5–8.1)

## 2022-04-14 LAB — CBC WITH DIFFERENTIAL/PLATELET
Abs Immature Granulocytes: 0.09 10*3/uL — ABNORMAL HIGH (ref 0.00–0.07)
Basophils Absolute: 0.1 10*3/uL (ref 0.0–0.1)
Basophils Relative: 1 %
Eosinophils Absolute: 0.8 10*3/uL — ABNORMAL HIGH (ref 0.0–0.5)
Eosinophils Relative: 8 %
HCT: 31.3 % — ABNORMAL LOW (ref 39.0–52.0)
Hemoglobin: 10.9 g/dL — ABNORMAL LOW (ref 13.0–17.0)
Immature Granulocytes: 1 %
Lymphocytes Relative: 13 %
Lymphs Abs: 1.4 10*3/uL (ref 0.7–4.0)
MCH: 31.7 pg (ref 26.0–34.0)
MCHC: 34.8 g/dL (ref 30.0–36.0)
MCV: 91 fL (ref 80.0–100.0)
Monocytes Absolute: 1 10*3/uL (ref 0.1–1.0)
Monocytes Relative: 10 %
Neutro Abs: 7.1 10*3/uL (ref 1.7–7.7)
Neutrophils Relative %: 67 %
Platelets: 258 10*3/uL (ref 150–400)
RBC: 3.44 MIL/uL — ABNORMAL LOW (ref 4.22–5.81)
RDW: 14.8 % (ref 11.5–15.5)
WBC: 10.4 10*3/uL (ref 4.0–10.5)
nRBC: 0 % (ref 0.0–0.2)

## 2022-04-14 LAB — MAGNESIUM: Magnesium: 1.6 mg/dL — ABNORMAL LOW (ref 1.7–2.4)

## 2022-04-14 LAB — PHOSPHORUS: Phosphorus: 2.6 mg/dL (ref 2.5–4.6)

## 2022-04-14 MED ORDER — MAGNESIUM SULFATE 2 GM/50ML IV SOLN
2.0000 g | Freq: Once | INTRAVENOUS | Status: AC
  Administered 2022-04-14: 2 g via INTRAVENOUS
  Filled 2022-04-14: qty 50

## 2022-04-14 MED ORDER — METOPROLOL TARTRATE 25 MG PO TABS: 25.0000 mg | ORAL_TABLET | Freq: Two times a day (BID) | ORAL | 0 refills | Status: DC

## 2022-04-14 MED ORDER — POTASSIUM CHLORIDE CRYS ER 20 MEQ PO TBCR: 40.0000 meq | EXTENDED_RELEASE_TABLET | Freq: Every day | ORAL | Status: DC

## 2022-04-14 NOTE — Progress Notes (Signed)
Order received to discharge patient.  Report called to receiving RN at [facility]   wound vac switched to Prevena pump.  [facility] transporting patient and his belongings.

## 2022-04-14 NOTE — TOC Transition Note (Signed)
Transition of Care [facility] - CM/SW Discharge Note   Patient Details  Name: Ryan Garner MRN: [id number] Date of Birth: 01/05/1946  Transition of Care [facility]) CM/SW Contact:  [name], LCSW Phone Number: [phone] 04/14/2022, 11:34 AM   Clinical Narrative:     Patient will DC to:?[name] date:?04/14/2022 Family notified:?Attempted notify spouse Transport by: [name]   Per MD patient ready for DC to [facility]. RN, patient, patient's family, and facility notified of DC. Discharge Summary sent to facility. RN given number for report [phone] room [place] packet on chart. Ambulance transport requested for patient.   CSW signing off.   [name],  [phone]    Final next level of care: Skilled Nursing Facility Barriers to Discharge: Barriers Resolved   Patient Goals and CMS Choice        Discharge Placement              Patient chooses bed at: [facility] Patient to be transferred to facility by: [name] Name of family member notified: spouse-[name] Patient and family notified of of transfer: 04/14/22  Discharge Plan and Services                                     Social Determinants of Health (SDOH) Interventions     Readmission Risk Interventions     No data to display

## 2022-04-14 NOTE — TOC Progression Note (Addendum)
Transition of Care [facility]) - Progression Note    Patient Details  Name: Ryan Garner MRN: [id number] Date of Birth: [birth year]  Transition of Care [facility]) CM/SW [place], LCSW Phone Number: 04/14/2022, 9:29 AM  Clinical Narrative:     CSW called Melissa at [facility] to notify impending DC. CSW had to leave VM.  10:18 AM CSW attempted to call Melissa again to facilitate DC and alert her about updated DC summary, had to leave another VM.  TOC team will continue to assist with discharge planning needs.    Expected Discharge Plan: [place] Barriers to Discharge: Continued Medical Work up  Expected Discharge Plan and Services Expected Discharge Plan: [place] arrangements for the past 2 months: Single Family Home Expected Discharge Date: 04/14/22                                     Social Determinants of Health (SDOH) Interventions    Readmission Risk Interventions     No data to display

## 2022-04-14 NOTE — Progress Notes (Signed)
PROGRESS NOTE    Ryan Garner  TKP:[id number] DOB: 1945-09-03 DOA: 04/04/2022 PCP: [name], [name], MD  No chief complaint on file.   Brief Narrative:  Patient 76 year old gentleman history of hypertension, hyperlipidemia, CAD with history of STEMI status post PCI, PAH, rate controlled Prescilla Monger-fib on anticoagulation, type 2 diabetes, morbid obesity who was admitted to orthopedic service, per Dr. Sharol Given for left leg cellulitis and abscess.  Patient recently hospitalized from 8/10 to 8/14 for recurrent cellulitis of the left leg at [facility] and failed outpatient management with Augmentin.  Patient noted to have been discharged on Doxy following hospitalization wound got worse with redness swelling and pain seen by PCP placed on cefdinir and subsequently transition to Bactrim.  Despite oral antibiotics wound worsening patient developed an abscess seen in the office by Dr. Sharol Given on 9/11, admitted placed on IV Vanco Flagyl and Rocephin taken to the OR on 04/06/2022.  During induction for intubation patient became bradycardic hypotensive received CPR temporary placed on pressors stabilized and he proceeded with debridement surgery.  Postoperatively patient noted to have been weaned off pressors extubated successfully.  Patient with some substernal chest discomfort.  Chest x-ray noted hazy opacification of the right mid to lower lung with possible right-sided effusion.  Hospitalist consulted for hypotension and bradycardia.  Cardiology consulted.  ID consulted and following.   He's been treated surgically for his left lower extremity infection.  ID recommended 3 weeks antibiotics after surgery.  Cardiology c/s for heart failure and assisted with diuresis.  He's got severe MVR and will need L/RHC as well as TEE, likely after treatment of his infection.  He's discharging on high dose oral lasix.  Needs continued outpatient follow up with ID, orthopedics, cardiology.   Discharge recommendations from [facility] Antibiotics  per ID, 3 weeks doxy/levaquin from OR (EOT 10/6), needs follow up with ID as scheduled on 10/6 Follow up with orthopedics for continued wound care, post op care -> planning for praveena portable vac at discharge Discharging on oral lasix 80 mg twice daily, needs frequent checking of BMP to ensure lytes and renal function stable  Needs TEE and L/RHC for severe MR outpatient after leg abscess healed, follow with cardiology outpatient   Assessment & Plan:   Principal Problem:   Abscess of leg, left Active Problems:   Atrial fibrillation (HCC)   Transient hypotension   Acute kidney injury superimposed on chronic kidney disease (HCC)   Hyponatremia   Bradycardia   (HFpEF) heart failure with preserved ejection fraction (HCC)   Hypotension   Nonrheumatic mitral (valve) insufficiency   Aortic valve stenosis, nonrheumatic   Assessment and Plan: #1 left lower extremity cellulitis with abscess -Status post I and D per Dr. Sharol Given 04/06/2022 with placement of wound VAC. -Initial cultures from left leg debridement 04/04/2022 per ID growing rare Serratia marcescens. -MRSA PCR negative. -abx narrowed to doxycycline and levaquin per ID -> planning for 3 weeks from OR (EOT 10/6) -> follow up with ID on 10/6 -Per primary team,/orthopedics, Dr. Sharol Given and per ID.     2.  Leukocytosis -Likely secondary to problem #1. -Continue IV antibiotics as directed by ID.   3.  Transient hypotension and bradycardia -Patient noted to be hypotensive and bradycardic for which CPR had to be started after patient given induction medications for intubation. -Patient extubated postprocedure, vasopressin weaned off. -Patient with complaints of chest soreness. -EKG with no significant changes. -Hypotension bradycardia likely secondary to induction of intubation. -Noted to have been on  metoprolol 50 mg the morning prior to procedure. -Cardiac enzymes slightly elevated but flat. -Cardiology following, currently on  metoprolol 25 mg BID   4.  Acute respiratory failure secondary to acute on chronic diastolic CHF -Patient with volume overload on examination with 3-4+ bilateral pitting lower extremity edema, crackles noted on examination. -echo with EF 50-55%, no RWMA, mild concentric LVH, IV septum flattened in systole and diastole, RVSF moderately reduced, severe MV regurgitation, moderate AV stenosis, IVC dilated with <50% resp variability -repeat echo 9/21 with severe MVR -Patient noted to be on Lasix 80 mg orally.82.5. -continue lasix 60 mg IV BID per cards -> planning to transition to PO as able, currently on high dose oral lasix per cardiology -net negative 28 L, weights appear unreliable  -Per cardiology, appreciate assistance - continue oral lasix, needs TEE, right/left cardiac cath, will need lytes checked outpatient relatively frequently given his high dose diuretics   5.  Severe MVR/moderate AAS/PAH -repeat TTE shows severe MVR, he'll need TEE, repeat right and L heart cath to characterize severity and etiology -Per cardiology   # Atrial Fibrillation Continue metop and eliquis  6.  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 -stable renal function today -Renal ultrasound with bilateral renal cortical thinning consistent with chronic medical renal disease, no hydronephrosis ->2 L out yesterday -Patient was of urinary retention per RN on 04/07/2022 -- Foley catheter ordered and placed.  Foley d/c'd 9/22, he's doing well. -consider trial of void within next few days  7.  Normocytic anemia Trend, relatively stable   8.  Hypokalemia Follow closely    9.  Hyponatremia Mild, follow with diuresis   10.  Hypomagnesemia Replace, follow    DVT prophylaxis: eliquis Code Status: full Family Communication: none Disposition:   Status is:  Inpatient Remains inpatient appropriate because: continued need for inpatient care   Consultants:  Cardiology ID Dr. Sharol Given, orthopedics is primary  Procedures:  Echo IMPRESSIONS     1. Left ventricular ejection fraction, by estimation, is 50 to 55%. The  left ventricle has mildly decreased function. The left ventricle has no  regional wall motion abnormalities. There is mild concentric left  ventricular hypertrophy. Left ventricular  diastolic function could not be evaluated. There is the interventricular  septum is flattened in systole and diastole, consistent with right  ventricular pressure and volume overload.   2. Right ventricular systolic function is moderately reduced. The right  ventricular size is moderately enlarged. There is severely elevated  pulmonary artery systolic pressure. The estimated right ventricular  systolic pressure is 50.5 mmHg.   3. Left atrial size was severely dilated.   4. Right atrial size was severely dilated.   5. The mitral valve is grossly normal. Severe mitral valve regurgitation.  Moderate mitral annular calcification.   6. The aortic valve is tricuspid. There is severe calcifcation of the  aortic valve. There is severe thickening of the aortic valve. Aortic valve  regurgitation is not visualized. Moderate aortic valve stenosis. Aortic  valve mean gradient measures 17.0  mmHg. Aortic valve Vmax measures 2.71 m/s. Aortic valve acceleration time  measures 120 msec.   7. Aortic dilatation noted. There is mild dilatation of the ascending  aorta, measuring 41 mm. There is mild dilatation of the aortic root,  measuring 39 mm.   8. The inferior vena cava  is dilated in size with <50% respiratory  variability, suggesting right atrial pressure of 15 mmHg.   Comparison(s): Prior images reviewed side by side. The left ventricular  function is worsened. Mitral insufficiency is worse.   Antimicrobials:  Anti-infectives (From admission, onward)     Start     Dose/Rate Route Frequency Ordered Stop   04/13/22 0000  doxycycline (VIBRA-TABS) 100 MG tablet        100 mg Oral Every 12 hours 04/13/22 1408     04/13/22 0000  levofloxacin (LEVAQUIN) 750 MG tablet        750 mg Oral Daily at bedtime 04/13/22 1408     04/11/22 2200  levofloxacin (LEVAQUIN) tablet 750 mg        750 mg Oral Daily at bedtime 04/11/22 1022 04/27/22 2359   04/11/22 2200  doxycycline (VIBRA-TABS) tablet 100 mg        100 mg Oral Every 12 hours 04/11/22 1321 04/27/22 2359   04/10/22 2200  ceFEPIme (MAXIPIME) 2 g in sodium chloride 0.9 % 100 mL IVPB  Status:  Discontinued        2 g 200 mL/hr over 30 Minutes Intravenous Every 12 hours 04/10/22 1215 04/11/22 1022   04/09/22 2200  amoxicillin-clavulanate (AUGMENTIN) 875-125 MG per tablet 1 tablet  Status:  Discontinued        1 tablet Oral Every 12 hours 04/09/22 1229 04/09/22 1615   04/09/22 2200  doxycycline (VIBRA-TABS) tablet 100 mg  Status:  Discontinued        100 mg Oral Every 12 hours 04/09/22 1229 04/11/22 1022   04/09/22 2200  ceFEPIme (MAXIPIME) 2 g in sodium chloride 0.9 % 100 mL IVPB  Status:  Discontinued        2 g 200 mL/hr over 30 Minutes Intravenous Every 8 hours 04/09/22 1615 04/10/22 1215   04/06/22 0845  ceFAZolin (ANCEF) IVPB 3g/100 mL premix  Status:  Discontinued        3 g 200 mL/hr over 30 Minutes Intravenous On call to O.R. 04/06/22 0839 04/06/22 1153   04/06/22 0600  vancomycin (VANCOREADY) IVPB 1750 mg/350 mL  Status:  Discontinued        1,750 mg 175 mL/hr over 120 Minutes Intravenous Every 36 hours 04/04/22 1602 04/05/22 1121   04/05/22 2000  metroNIDAZOLE (FLAGYL) tablet 500 mg  Status:  Discontinued        500 mg Oral Every 12 hours 04/05/22 1021 04/09/22 1229   04/05/22 1630  vancomycin (VANCOCIN) IVPB 1000 mg/200 mL premix  Status:  Discontinued        1,000 mg 200 mL/hr over 60 Minutes Intravenous Every 24 hours 04/05/22 1121 04/09/22 1229   04/04/22 1400  cefTRIAXone (ROCEPHIN) 2 g  in sodium chloride 0.9 % 100 mL IVPB  Status:  Discontinued        2 g 200 mL/hr over 30 Minutes Intravenous Every 24 hours 04/04/22 1218 04/09/22 1229   04/04/22 1400  metroNIDAZOLE (FLAGYL) IVPB 500 mg  Status:  Discontinued        500 mg 100 mL/hr over 60 Minutes Intravenous Every 8 hours 04/04/22 1218 04/05/22 1021   04/04/22 1400  vancomycin (VANCOCIN) 2,500 mg in sodium chloride 0.9 % 500 mL IVPB        2,500 mg 262.5 mL/hr over 120 Minutes Intravenous  Once 04/04/22 1312 04/04/22 1800   04/04/22 0800  ceFAZolin (ANCEF) IVPB 3g/100 mL premix        3 g 200  mL/hr over 30 Minutes Intravenous On call to O.R. 04/04/22 0746 04/04/22 1002       Subjective: No new complaints  Objective: Vitals:   04/13/22 2306 04/14/22 0324 04/14/22 0330 04/14/22 0718  BP: 112/80 116/77  111/70  Pulse: 94 86  87  Resp: '20 20  19  '$ Temp: 97.8 F (36.6 C) 98 F (36.7 C)  97.9 F (36.6 C)  TempSrc: Oral Oral  Oral  SpO2: 96% 97%  98%  Weight:   126 kg   Height:        Intake/Output Summary (Last 24 hours) at 04/14/2022 0944 Last data filed at 04/14/2022 0647 Gross per 24 hour  Intake 480 ml  Output 2100 ml  Net -1620 ml   Filed Weights   04/12/22 0325 04/13/22 0316 04/14/22 0330  Weight: 132 kg 126.2 kg 126 kg    Examination:  General: No acute distress. Cardiovascular: RRR Lungs: unlabored Abdomen: Soft, nontender, nondistended  Neurological: Alert and oriented 3. Moves all extremities 4 with equal strength. Cranial nerves II through XII grossly intact. Extremities: RLE edema, LLE with dressing/wound vac   Data Reviewed: I have personally reviewed following labs and imaging studies  CBC: Recent Labs  Lab 04/09/22 1125 04/10/22 0429 04/11/22 0321 04/12/22 0818 04/13/22 0334 04/14/22 0555  WBC 11.9* 15.1* 13.6* 11.6* 10.4 10.4  NEUTROABS 9.7* 12.5* 10.2*  --  7.3 7.1  HGB 11.8* 11.7* 10.2* 10.4* 10.8* 10.9*  HCT 34.3* 34.8* 29.4* 30.6* 31.5* 31.3*  MCV 90.7 92.3 91.6  91.9 91.6 91.0  PLT 237 252 209 217 230 101    Basic Metabolic Panel: Recent Labs  Lab 04/08/22 0125 04/09/22 1125 04/10/22 0429 04/11/22 0321 04/12/22 0818 04/13/22 0334 04/14/22 0555  NA 129* 134* 135 136 133* 133* 133*  K 4.1 3.7 3.7 3.2* 3.1* 3.6 3.9  CL 97* 96* 96* 96* 94* 93* 97*  CO2 '22 27 28 28 31 29 25  '$ GLUCOSE 130* 164* 139* 115* 123* 118* 124*  BUN 51* 41* 34* 34* 33* 33* 33*  CREATININE 1.56* 1.22 1.15 1.01 1.06 1.06 1.13  CALCIUM 9.1 9.3 9.0 9.2 8.7* 8.8* 8.9  MG 1.9 1.4* 1.7 1.7  --  1.5* 1.6*  PHOS 3.5 3.1 2.9  --   --  2.2* 2.6    GFR: Estimated Creatinine Clearance: 69.7 mL/min (by C-G formula based on SCr of 1.13 mg/dL).  Liver Function Tests: Recent Labs  Lab 04/09/22 1125 04/10/22 0429 04/12/22 0818 04/13/22 0334 04/14/22 0555  AST  --   --  '16 18 23  '$ ALT  --   --  '10 10 13  '$ ALKPHOS  --   --  55 64 61  BILITOT  --   --  1.4* 1.2 0.8  PROT  --   --  5.6* 5.6* 5.8*  ALBUMIN 2.7* 2.6* 2.6* 2.6* 2.7*    CBG: No results for input(s): "GLUCAP" in the last 168 hours.    Recent Results (from the past 240 hour(s))  Surgical pcr screen     Status: None   Collection Time: 04/05/22  8:11 PM   Specimen: Nasal Mucosa; Nasal Swab  Result Value Ref Range Status   MRSA, PCR NEGATIVE NEGATIVE Final   Staphylococcus aureus NEGATIVE NEGATIVE Final    Comment: (NOTE) The Xpert SA Assay (FDA approved for NASAL specimens in patients 53 years of age and older), is one component of [facility] comprehensive surveillance program. It is not intended to diagnose infection nor to guide  or monitor treatment. Performed at [facility], [place] [address] [place], [place] [address]   MRSA Next Gen by PCR, Nasal     Status: None   Collection Time: 04/06/22 12:25 PM   Specimen: Nasal Mucosa; Nasal Swab  Result Value Ref Range Status   MRSA by PCR Next Gen NOT DETECTED NOT DETECTED Final    Comment: (NOTE) The GeneXpert MRSA Assay (FDA approved for NASAL specimens  only), is one component of [employer] comprehensive MRSA colonization surveillance program. It is not intended to diagnose MRSA infection nor to guide or monitor treatment for MRSA infections. Test performance is not FDA approved in patients less than 44 years old. Performed at [facility], [place] [address] [place], [place] [address]          Radiology Studies: ECHOCARDIOGRAM LIMITED  Result Date: 04/12/2022    ECHOCARDIOGRAM LIMITED REPORT   Patient Name:   ELIJAN GOOGE Date of Exam: 04/12/2022 Medical Rec #:  [id number]      Height:       68.0 in Accession #:    [id number]     Weight:       291.1 lb Date of Birth:  25-Feb-1946       BSA:          2.397 m Patient Age:    89 years       BP:           107/60 mmHg Patient Gender: M              HR:           89 bpm. Exam Location:  Inpatient Procedure: Limited Echo, Cardiac Doppler and Color Doppler Indications:    Mitral valve insufficiency  History:        Patient has prior history of Echocardiogram examinations, most                 recent 04/07/2022. CHF, Previous Myocardial Infarction and CAD,                 Arrythmias:Atrial Fibrillation, Signs/Symptoms:Dyspnea; Risk                 Factors:Sleep Apnea, Hypertension, Dyslipidemia and Diabetes.  Sonographer:    Eartha Inch Referring Phys: [id number] [place]  Sonographer Comments: Technically challenging study due to limited acoustic windows. Image acquisition challenging due to patient body habitus. IMPRESSIONS  1. Very limited study to evaluated mitral regurgitation. Severe mitral valve regurgitation appears to be related to restricted PMVL. Jet is posteriorly directed. Unchanged from prior. The mitral valve is abnormal. Severe mitral valve regurgitation. No evidence of mitral stenosis.  2. Left ventricular ejection fraction, by estimation, is 50 to 55%. The left ventricle has low normal function. The left ventricle demonstrates global hypokinesis. There is the interventricular septum is  flattened in systole and diastole, consistent with right ventricular pressure and volume overload.  3. Right ventricular systolic function was not well visualized. The right ventricular size is not well visualized. Comparison(s): No significant change from prior study. FINDINGS  Left Ventricle: Left ventricular ejection fraction, by estimation, is 50 to 55%. The left ventricle has low normal function. The left ventricle demonstrates global hypokinesis. The interventricular septum is flattened in systole and diastole, consistent  with right ventricular pressure and volume overload. Right Ventricle: The right ventricular size is not well visualized. Right vetricular wall thickness was not well visualized. Right ventricular systolic function was not well visualized.  Pericardium: There is no evidence of pericardial effusion. Mitral Valve: Very limited study to evaluated mitral regurgitation. Severe mitral valve regurgitation appears to be related to restricted PMVL. Jet is posteriorly directed. Unchanged from prior. The mitral valve is abnormal. Severe mitral valve regurgitation. No evidence of mitral valve stenosis. MR Peak grad:    86.1 mmHg MR Mean grad:    63.0 mmHg MR Vmax:         464.00 cm/s MR Vmean:        380.0 cm/s MR PISA:         1.57 cm MR PISA Eff ROA: 15 mm MR PISA Radius:  0.50 cm Eleonore Chiquito MD Electronically signed by Eleonore Chiquito MD Signature Date/Time: 04/12/2022/1:38:43 PM    Final         Scheduled Meds:  apixaban  5 mg Oral BID   vitamin C  1,000 mg Oral Daily   atorvastatin  80 mg Oral Daily   clopidogrel  75 mg Oral Q breakfast   docusate sodium  100 mg Oral BID   doxycycline  100 mg Oral Q12H   furosemide  80 mg Oral BID   levofloxacin  750 mg Oral QHS   metoprolol tartrate  25 mg Oral BID   nutrition supplement (JUVEN)  1 packet Oral BID BM   pantoprazole  40 mg Oral Daily   potassium chloride  20 mEq Oral TID   zinc sulfate  220 mg Oral Daily   Continuous  Infusions:  magnesium sulfate bolus IVPB     methocarbamol (ROBAXIN) IV       LOS: 10 days    Time spent: over 30 min    Fayrene Helper, MD [facility]   To contact the attending provider between 7A-7P or the covering provider during after hours 7P-7A, please log into the web site [email or web address] and access using universal [place] password for that web site. If you do not have the password, please call the hospital operator.  04/14/2022, 9:44 AM

## 2022-04-14 NOTE — Discharge Summary (Signed)
Physician Discharge Summary  GEO SLONE HQP:[id number] DOB: 11/04/1945 DOA: 04/04/2022  PCP: [name], [name], MD  Admit date: 04/04/2022 Discharge date: 04/14/2022  Time spent: 40 minutes  Recommendations for Outpatient Follow-up:  Follow outpatient CBC/CMP Antibiotics per ID, 3 weeks doxy/levaquin from OR (EOT 10/6), needs follow up with ID as scheduled on 10/6 Follow up with orthopedics for continued wound care, post op care -> planning for praveena portable vac at discharge Discharging on oral lasix 80 mg twice daily, needs frequent checking of BMP to ensure lytes and renal function stable - may need adjustment in potassium dose Needs TEE and L/RHC for severe MR outpatient after leg abscess healed, follow with cardiology outpatient   Discharge Diagnoses:  Principal Problem:   Abscess of leg, left Active Problems:   Atrial fibrillation (HCC)   Transient hypotension   Acute kidney injury superimposed on chronic kidney disease (HCC)   Hyponatremia   Bradycardia   (HFpEF) heart failure with preserved ejection fraction (HCC)   Hypotension   Nonrheumatic mitral (valve) insufficiency   Aortic valve stenosis, nonrheumatic   Discharge Condition: stable  Diet recommendation: heart healthy  Filed Weights   04/12/22 0325 04/13/22 0316 04/14/22 0330  Weight: 132 kg 126.2 kg 126 kg    History of present illness:  Patient 76 year old gentleman history of hypertension, hyperlipidemia, CAD with history of STEMI status post PCI, PAH, rate controlled Staley Lunz-fib on anticoagulation, type 2 diabetes, morbid obesity who was admitted to orthopedic service, per Dr. Sharol Given for left leg cellulitis and abscess.  Patient recently hospitalized from 8/10 to 8/14 for recurrent cellulitis of the left leg at [facility] and failed outpatient management with Augmentin.  Patient noted to have been discharged on Doxy following hospitalization wound got worse with redness swelling and pain seen by PCP placed on  cefdinir and subsequently transition to Bactrim.  Despite oral antibiotics wound worsening patient developed an abscess seen in the office by Dr. Sharol Given on 9/11, admitted placed on IV Vanco Flagyl and Rocephin taken to the OR on 04/06/2022.  During induction for intubation patient became bradycardic hypotensive received CPR temporary placed on pressors stabilized and he proceeded with debridement surgery.  Postoperatively patient noted to have been weaned off pressors extubated successfully.  Patient with some substernal chest discomfort.  Chest x-ray noted hazy opacification of the right mid to lower lung with possible right-sided effusion.  Hospitalist consulted for hypotension and bradycardia.  Cardiology consulted.  ID consulted and following.    He's been treated surgically for his left lower extremity infection.  ID recommended 3 weeks antibiotics after surgery.  Cardiology c/s for heart failure and assisted with diuresis.  He's got severe MVR and will need L/RHC as well as TEE, likely after treatment of his infection.  He's discharging on high dose oral lasix.  Needs continued outpatient follow up with ID, orthopedics, cardiology.    Hospital Course:  Assessment and Plan: #1 left lower extremity cellulitis with abscess -Status post I and D per Dr. Sharol Given 04/06/2022 with placement of wound VAC. -Initial cultures from left leg debridement 04/04/2022 per ID growing rare Serratia marcescens. -MRSA PCR negative. -abx narrowed to doxycycline and levaquin per ID -> planning for 3 weeks from OR (EOT 10/6) -> follow up with ID on 10/6 -Per primary team,/orthopedics, Dr. Sharol Given and per ID.     2.  Leukocytosis -Likely secondary to problem #1. -Continue IV antibiotics as directed by ID.   3.  Transient hypotension and bradycardia -Patient noted  to be hypotensive and bradycardic for which CPR had to be started after patient given induction medications for intubation. -Patient extubated postprocedure,  vasopressin weaned off. -Patient with complaints of chest soreness. -EKG with no significant changes. -Hypotension bradycardia likely secondary to induction of intubation. -Noted to have been on metoprolol 50 mg the morning prior to procedure. -Cardiac enzymes slightly elevated but flat. -Cardiology following, currently on metoprolol 25 mg BID   4.  Acute respiratory failure secondary to acute on chronic diastolic CHF -Patient with volume overload on examination with 3-4+ bilateral pitting lower extremity edema, crackles noted on examination. -echo with EF 50-55%, no RWMA, mild concentric LVH, IV septum flattened in systole and diastole, RVSF moderately reduced, severe MV regurgitation, moderate AV stenosis, IVC dilated with <50% resp variability -repeat echo 9/21 with severe MVR -Patient noted to be on Lasix 80 mg orally.82.5. -continue lasix 60 mg IV BID per cards -> planning to transition to PO as able, currently on high dose oral lasix per cardiology -net negative 28 L, weights appear unreliable  -Per cardiology, appreciate assistance - continue oral lasix, needs TEE, right/left cardiac cath, will need lytes checked outpatient relatively frequently given his high dose diuretics   5.  Severe MVR/moderate AAS/PAH -repeat TTE shows severe MVR, he'll need TEE, repeat right and L heart cath to characterize severity and etiology -Per cardiology   # Atrial Fibrillation Continue metop and eliquis   6.  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 -stable renal function today -Renal ultrasound with bilateral renal cortical thinning consistent with chronic medical renal disease, no hydronephrosis ->2 L out yesterday -Patient was of urinary retention per RN on 04/07/2022 -- Foley catheter ordered and placed.  Foley d/c'd 9/22, he's doing  well. -consider trial of void within next few days   7.  Normocytic anemia Trend, relatively stable   8.  Hypokalemia Follow closely    9.  Hyponatremia Mild, follow with diuresis   10.  Hypomagnesemia Replace, follow    Procedures: LEFT LEG DEBRIDEMENT Application of Kerecis tissue graft with 38 cm micro powder and 3 x 7 cm sheet. Local tissue rearrangement for wound closure 10 x 5 cm. Application of cleanse choice wound VAC sponge and 13 cm Prevena sponge.  Echo IMPRESSIONS     1. Left ventricular ejection fraction, by estimation, is 50 to 55%. The  left ventricle has mildly decreased function. The left ventricle has no  regional wall motion abnormalities. There is mild concentric left  ventricular hypertrophy. Left ventricular  diastolic function could not be evaluated. There is the interventricular  septum is flattened in systole and diastole, consistent with right  ventricular pressure and volume overload.   2. Right ventricular systolic function is moderately reduced. The right  ventricular size is moderately enlarged. There is severely elevated  pulmonary artery systolic pressure. The estimated right ventricular  systolic pressure is 01.6 mmHg.   3. Left atrial size was severely dilated.   4. Right atrial size was severely dilated.   5. The mitral valve is grossly normal. Severe mitral valve regurgitation.  Moderate mitral annular calcification.   6. The aortic valve is tricuspid. There is severe calcifcation of the  aortic valve. There is severe thickening of the aortic valve. Aortic valve  regurgitation is not visualized. Moderate aortic valve stenosis. Aortic  valve mean  gradient measures 17.0  mmHg. Aortic valve Vmax measures 2.71 m/s. Aortic valve acceleration time  measures 120 msec.   7. Aortic dilatation noted. There is mild dilatation of the ascending  aorta, measuring 41 mm. There is mild dilatation of the aortic root,  measuring 39 mm.   8. The  inferior vena cava is dilated in size with <50% respiratory  variability, suggesting right atrial pressure of 15 mmHg.   Comparison(s): Prior images reviewed side by side. The left ventricular  function is worsened. Mitral insufficiency is worse.   Consultations: Cardiology orthopedics  Discharge Exam: Vitals:   04/14/22 0324 04/14/22 0718  BP: 116/77 111/70  Pulse: 86 87  Resp: 20 19  Temp: 98 F (36.7 C) 97.9 F (36.6 C)  SpO2: 97% 98%   See PN  Discharge Instructions   Discharge Instructions     Change dressing   Complete by: As directed    Dry dressing change left leg, change daily, and wash with soap and water.   Discharge instructions   Complete by: As directed    You were seen for an infection in your left leg that was treated surgically by ortho.  You've improved with antibiotics and surgery.    You'll discharge home with an extended course of antibiotics.  You should follow up with infectious disease as scheduled on 10/6.    Follow with orthopedics outpatient for follow up of your left leg.  You have severe mitral regurgitation which will need to be followed with cardiology.  You'll need Kelani Robart transesophageal echo and cardiac catheterization to work this up further in the future.  Your lasix dose was increased.  You'll need more frequent monitoring of your electrolytes and potassium outpatient.   Return for new, recurrent, or worsening symptoms.  Please ask your PCP to request records from this hospitalization so they know what was done and what the next steps will be.   Weight bearing as tolerated   Complete by: As directed    Laterality: left   Extremity: Lower      Allergies as of 04/14/2022       Reactions   Flecainide    Per patient dizziness, shortness of breath, blurred vision   Rofecoxib Rash, Other (See Comments)   (Vioxx)        Medication List     STOP taking these medications    albuterol 108 (90 Base) MCG/ACT inhaler Commonly  known as: VENTOLIN HFA   sulfamethoxazole-trimethoprim 800-160 MG tablet Commonly known as: BACTRIM DS   traMADol 50 MG tablet Commonly known as: ULTRAM       TAKE these medications    apixaban 5 MG Tabs tablet Commonly known as: ELIQUIS Take 1 tablet (5 mg total) by mouth 2 (two) times daily.   ascorbic acid 1000 MG tablet Commonly known as: VITAMIN C Take 1 tablet (1,000 mg total) by mouth daily.   atorvastatin 80 MG tablet Commonly known as: LIPITOR Take 1 tablet by mouth once daily   clopidogrel 75 MG tablet Commonly known as: PLAVIX Take 1 tablet (75 mg total) by mouth daily with breakfast.   cyanocobalamin 1000 MCG tablet Commonly known as: VITAMIN B12 Take 1,000 mcg by mouth daily.   doxycycline 100 MG tablet Commonly known as: VIBRA-TABS Take 1 tablet (100 mg total) by mouth every 12 (twelve) hours.   furosemide 80 MG tablet Commonly known as: LASIX Take 1 tablet (80 mg total) by mouth 2 (two) times daily. What changed:  medication  strength when to take this   levofloxacin 750 MG tablet Commonly known as: LEVAQUIN Take 1 tablet (750 mg total) by mouth at bedtime.   methocarbamol 500 MG tablet Commonly known as: ROBAXIN Take 1 tablet (500 mg total) by mouth every 6 (six) hours as needed for muscle spasms.   metoprolol tartrate 25 MG tablet Commonly known as: LOPRESSOR Take 1 tablet (25 mg total) by mouth 2 (two) times daily. What changed:  medication strength how much to take   multivitamin with minerals Tabs tablet Take 1 tablet by mouth daily.   nitroGLYCERIN 0.4 MG SL tablet Commonly known as: NITROSTAT Place 1 tablet (0.4 mg total) under the tongue every 5 (five) minutes x 3 doses as needed for chest pain.   nutrition supplement (JUVEN) Pack Take 1 packet by mouth 2 (two) times daily between meals.   Oxycodone HCl 10 MG Tabs Take 1-1.5 tablets (10-15 mg total) by mouth every 4 (four) hours as needed for severe pain (pain score 7-10).    potassium chloride SA 20 MEQ tablet Commonly known as: KLOR-CON M Take 2 tablets (40 mEq total) by mouth daily. What changed:  how much to take how to take this when to take this additional instructions   Prostate Support 300-15 MG Tabs Take 1 tablet by mouth daily. Super Beta Prostate   Spiriva Respimat 1.25 MCG/ACT Aers Generic drug: Tiotropium Bromide Monohydrate Inhale 2 puffs into the lungs daily.   zinc sulfate 220 (50 Zn) MG capsule Take 1 capsule (220 mg total) by mouth daily.               Discharge Care Instructions  (From admission, onward)           Start     Ordered   04/12/22 0000  Weight bearing as tolerated       Question Answer Comment  Laterality left   Extremity Lower      04/12/22 1201   04/12/22 0000  Change dressing       Comments: Dry dressing change left leg, change daily, and wash with soap and water.   04/12/22 1201           Allergies  Allergen Reactions   Flecainide     Per patient dizziness, shortness of breath, blurred vision   Rofecoxib Rash and Other (See Comments)    (Vioxx)    Follow-up Information     Newt Minion, MD Follow up in 1 week(s).   Specialty: Orthopedic Surgery Contact information: [place] [address] [phone]         Laurice Record, MD Follow up.   Specialty: Infectious Diseases Why: 04/27/22 at 10:30am. Please all to reschedule if you cannot make this appointment. Contact information: [address], [place] [address] [phone]         Leanor Kail, [place] Follow up.   Specialty: Cardiology Why: Hospital follow-up with Cardiology scheduled for 04/26/2022 at 3:10pm. Please arrive 15 minutes early for check-in. If this date/time does not work for you, please call our office to reschedule. Contact information: [place] [address] [phone]                  The results of significant diagnostics from  this hospitalization (including imaging, microbiology, ancillary and laboratory) are listed below for reference.    Significant Diagnostic Studies: ECHOCARDIOGRAM LIMITED  Result Date: 04/12/2022    ECHOCARDIOGRAM LIMITED REPORT  Patient Name:   MONTRELLE EDDINGS Date of Exam: 04/12/2022 Medical Rec #:  [id number]      Height:       68.0 in Accession #:    [id number]     Weight:       291.1 lb Date of Birth:  15-Mar-1946       BSA:          2.397 m Patient Age:    65 years       BP:           107/60 mmHg Patient Gender: M              HR:           89 bpm. Exam Location:  Inpatient Procedure: Limited Echo, Cardiac Doppler and Color Doppler Indications:    Mitral valve insufficiency  History:        Patient has prior history of Echocardiogram examinations, most                 recent 04/07/2022. CHF, Previous Myocardial Infarction and CAD,                 Arrythmias:Atrial Fibrillation, Signs/Symptoms:Dyspnea; Risk                 Factors:Sleep Apnea, Hypertension, Dyslipidemia and Diabetes.  Sonographer:    Eartha Inch Referring Phys: [id number] [place]  Sonographer Comments: Technically challenging study due to limited acoustic windows. Image acquisition challenging due to patient body habitus. IMPRESSIONS  1. Very limited study to evaluated mitral regurgitation. Severe mitral valve regurgitation appears to be related to restricted PMVL. Jet is posteriorly directed. Unchanged from prior. The mitral valve is abnormal. Severe mitral valve regurgitation. No evidence of mitral stenosis.  2. Left ventricular ejection fraction, by estimation, is 50 to 55%. The left ventricle has low normal function. The left ventricle demonstrates global hypokinesis. There is the interventricular septum is flattened in systole and diastole, consistent with right ventricular pressure and volume overload.  3. Right ventricular systolic function was not well visualized. The right ventricular size is not well visualized.  Comparison(s): No significant change from prior study. FINDINGS  Left Ventricle: Left ventricular ejection fraction, by estimation, is 50 to 55%. The left ventricle has low normal function. The left ventricle demonstrates global hypokinesis. The interventricular septum is flattened in systole and diastole, consistent  with right ventricular pressure and volume overload. Right Ventricle: The right ventricular size is not well visualized. Right vetricular wall thickness was not well visualized. Right ventricular systolic function was not well visualized. Pericardium: There is no evidence of pericardial effusion. Mitral Valve: Very limited study to evaluated mitral regurgitation. Severe mitral valve regurgitation appears to be related to restricted PMVL. Jet is posteriorly directed. Unchanged from prior. The mitral valve is abnormal. Severe mitral valve regurgitation. No evidence of mitral valve stenosis. MR Peak grad:    86.1 mmHg MR Mean grad:    63.0 mmHg MR Vmax:         464.00 cm/s MR Vmean:        380.0 cm/s MR PISA:         1.57 cm MR PISA Eff ROA: 15 mm MR PISA Radius:  0.50 cm Eleonore Chiquito MD Electronically signed by Eleonore Chiquito MD Signature Date/Time: 04/12/2022/1:38:43 PM    Final    US RENAL  Result Date: 04/07/2022 CLINICAL DATA:  Acute renal failure EXAM: RENAL / URINARY TRACT ULTRASOUND COMPLETE COMPARISON:  None Available.  FINDINGS: Right Kidney: Renal measurements: 10.1 x 5.2 x 6.1 cm = volume: 166 mL. Cortical thinning. Echogenicity within normal limits. No mass or hydronephrosis visualized. Left Kidney: Renal measurements: 11.6 x 5.9 x 5.9 cm = volume: 210 mL. Cortical thinning. Echogenicity within normal limits. No mass or hydronephrosis visualized. Bladder: Not visualized. Other: None. IMPRESSION: 1. Bilateral renal cortical thinning, in keeping with chronic medical renal disease. 2. No hydronephrosis. 3. Bladder is nonvisualized, presumably due to decompression. Electronically Signed   By:  Delanna Ahmadi M.D.   On: 04/07/2022 15:55   ECHOCARDIOGRAM COMPLETE  Result Date: 04/07/2022    ECHOCARDIOGRAM REPORT   Patient Name:   WHITFIELD DULAY Date of Exam: 04/07/2022 Medical Rec #:  [id number]      Height:       68.0 in Accession #:    [id number]     Weight:       296.0 lb Date of Birth:  1945/10/20       BSA:          2.414 m Patient Age:    55 years       BP:           102/76 mmHg Patient Gender: M              HR:           67 bpm. Exam Location:  Inpatient Procedure: 2D Echo, Cardiac Doppler and Color Doppler Indications:    Brief cardiac arrest following wound debridement  History:        Patient has prior history of Echocardiogram examinations, most                 recent 06/02/2021. CHF, CAD and Previous Myocardial Infarction,                 Arrythmias:Atrial Fibrillation; Risk Factors:Hypertension,                 Diabetes and Dyslipidemia.  Sonographer:    Merrie Roof RDCS Referring Phys: Berniece Salines  Sonographer Comments: Patient is obese. Image acquisition challenging due to patient body habitus and patient supine. IMPRESSIONS  1. Left ventricular ejection fraction, by estimation, is 50 to 55%. The left ventricle has mildly decreased function. The left ventricle has no regional wall motion abnormalities. There is mild concentric left ventricular hypertrophy. Left ventricular diastolic function could not be evaluated. There is the interventricular septum is flattened in systole and diastole, consistent with right ventricular pressure and volume overload.  2. Right ventricular systolic function is moderately reduced. The right ventricular size is moderately enlarged. There is severely elevated pulmonary artery systolic pressure. The estimated right ventricular systolic pressure is 29.5 mmHg.  3. Left atrial size was severely dilated.  4. Right atrial size was severely dilated.  5. The mitral valve is grossly normal. Severe mitral valve regurgitation. Moderate mitral annular calcification.  6.  The aortic valve is tricuspid. There is severe calcifcation of the aortic valve. There is severe thickening of the aortic valve. Aortic valve regurgitation is not visualized. Moderate aortic valve stenosis. Aortic valve mean gradient measures 17.0 mmHg. Aortic valve Vmax measures 2.71 m/s. Aortic valve acceleration time measures 120 msec.  7. Aortic dilatation noted. There is mild dilatation of the ascending aorta, measuring 41 mm. There is mild dilatation of the aortic root, measuring 39 mm.  8. The inferior vena cava is dilated in size with <50% respiratory variability, suggesting right atrial pressure of 15 mmHg. Comparison(s): Prior  images reviewed side by side. The left ventricular function is worsened. Mitral insufficiency is worse. FINDINGS  Left Ventricle: Left ventricular ejection fraction, by estimation, is 50 to 55%. The left ventricle has mildly decreased function. The left ventricle has no regional wall motion abnormalities. The left ventricular internal cavity size was normal in size. There is mild concentric left ventricular hypertrophy. The interventricular septum is flattened in systole and diastole, consistent with right ventricular pressure and volume overload. Left ventricular diastolic function could not be evaluated due to atrial fibrillation. Left ventricular diastolic function could not be evaluated. Right Ventricle: The right ventricular size is moderately enlarged. Right vetricular wall thickness was not well visualized. Right ventricular systolic function is moderately reduced. There is severely elevated pulmonary artery systolic pressure. The tricuspid regurgitant velocity is 4.19 m/s, and with an assumed right atrial pressure of 15 mmHg, the estimated right ventricular systolic pressure is 32.7 mmHg. Left Atrium: Left atrial size was severely dilated. Right Atrium: Right atrial size was severely dilated. Pericardium: There is no evidence of pericardial effusion. Mitral Valve: The mitral  valve is grossly normal. Moderate mitral annular calcification. Severe mitral valve regurgitation. Tricuspid Valve: The tricuspid valve is normal in structure. Tricuspid valve regurgitation is mild. Aortic Valve: Gradients underestimate the aortic stenosis severity due to low stroke volume. The aortic valve is tricuspid. There is severe calcifcation of the aortic valve. There is severe thickening of the aortic valve. Aortic valve regurgitation is not visualized. Moderate aortic stenosis is present. Aortic valve mean gradient measures 17.0 mmHg. Aortic valve peak gradient measures 29.4 mmHg. Aortic valve area, by VTI measures 1.23 cm. Pulmonic Valve: The pulmonic valve was grossly normal. Pulmonic valve regurgitation is not visualized. Aorta: Aortic dilatation noted. There is mild dilatation of the ascending aorta, measuring 41 mm. There is mild dilatation of the aortic root, measuring 39 mm. Venous: Jenny Lai pattern of systolic flow reversal, suggestive of severe mitral regurgitation is recorded from the left upper pulmonary vein. The inferior vena cava is dilated in size with less than 50% respiratory variability, suggesting right atrial pressure of 15 mmHg. IAS/Shunts: No atrial level shunt detected by color flow Doppler.  LEFT VENTRICLE PLAX 2D LVIDd:         5.40 cm   Diastology LVIDs:         4.00 cm   LV e' lateral: 12.00 cm/s LV PW:         1.20 cm LV IVS:        1.20 cm LVOT diam:     2.40 cm LV SV:         74 LV SV Index:   31 LVOT Area:     4.52 cm  RIGHT VENTRICLE            IVC RV Basal diam:  4.70 cm    IVC diam: 2.50 cm RV Mid diam:    3.30 cm RV S prime:     6.09 cm/s TAPSE (M-mode): 1.2 cm LEFT ATRIUM              Index        RIGHT ATRIUM           Index LA diam:        5.20 cm  2.15 cm/m   RA Area:     28.40 cm LA Vol (A2C):   115.0 ml 47.63 ml/m  RA Volume:   95.20 ml  39.43 ml/m LA Vol (A4C):   107.0 ml 44.32 ml/m  LA Biplane Vol: 114.0 ml 47.22 ml/m  AORTIC VALVE AV Area (Vmax):    1.38 cm AV  Area (Vmean):   1.25 cm AV Area (VTI):     1.23 cm AV Vmax:           271.00 cm/s AV Vmean:          197.000 cm/s AV VTI:            0.602 m AV Peak Grad:      29.4 mmHg AV Mean Grad:      17.0 mmHg LVOT Vmax:         82.51 cm/s LVOT Vmean:        54.523 cm/s LVOT VTI:          0.163 m LVOT/AV VTI ratio: 0.27  AORTA Ao Root diam: 3.90 cm Ao Asc diam:  4.00 cm MR Peak grad:    66.9 mmHg    TRICUSPID VALVE MR Mean grad:    47.0 mmHg    TR Peak grad:   70.2 mmHg MR Vmax:         409.00 cm/s  TR Vmax:        419.00 cm/s MR Vmean:        330.0 cm/s MR PISA:         8.31 cm     SHUNTS MR PISA Eff ROA: 68 mm       Systemic VTI:  0.16 m MR PISA Radius:  1.15 cm      Systemic Diam: 2.40 cm Dani Gobble Croitoru MD Electronically signed by Sanda Klein MD Signature Date/Time: 04/07/2022/2:16:06 PM    Final    DG CHEST PORT 1 VIEW  Result Date: 04/06/2022 CLINICAL DATA:  Respiratory abnormalities. Patient coded in the OR and now has chest pain with shortness of breath. EXAM: PORTABLE CHEST 1 VIEW COMPARISON:  03/01/2022 FINDINGS: Lungs are hypoinflated demonstrate airspace opacification over the right mid to lower lung which may be due to atelectasis or infection. Possible small amount right pleural fluid. Mild stable cardiomegaly. Remainder of the exam is unchanged. IMPRESSION: 1. Hazy opacification over the right mid to lower lung which may be due to atelectasis or infection. Possible small amount right pleural fluid. 2. Mild stable cardiomegaly. Electronically Signed   By: Marin Olp M.D.   On: 04/06/2022 11:41   MR TIBIA FIBULA LEFT WO CONTRAST  Result Date: 04/05/2022 CLINICAL DATA:  Soft tissue infection. Recent debridement 04/04/2022. EXAM: MRI OF LOWER LEFT EXTREMITY WITHOUT CONTRAST TECHNIQUE: Multiplanar, multisequence MR imaging of the left lower leg was performed. No intravenous contrast was administered. COMPARISON:  None Available. FINDINGS: Bones/Joint/Cartilage No fracture or dislocation. Normal  alignment. No joint effusion. No marrow signal abnormality. Ligaments Collateral ligaments are intact. Muscles and Tendons Flexor, peroneal and extensor compartment tendons are intact. Atrophy of the distal medial gastrocnemius muscle. Mild atrophy of the remainder of the left lower leg muscles. Soft tissue 13.3 x 4 x 3 cm soft tissue wound involving the proximal anterior left lower leg with an overlying wound VAC. Heterogeneous fluid collection measuring 4 x 3 cm at the site of debridement extending down to the anterior compartment fascia with small low signal foci likely reflecting small amount hemorrhagic fluid. Surrounding soft tissue edema in the subcutaneous fat which may reflect postsurgical changes versus cellulitis. No soft tissue mass. Generalized soft tissue edema throughout bilateral lower legs likely secondary to fluid overload IMPRESSION: 1. 13.3 x 4 x 3 cm soft tissue wound involving the  proximal anterior left lower leg with an overlying wound VAC. Heterogeneous fluid collection measuring 4 x 3 cm at the site of debridement extending down to the anterior compartment fascia with small low signal foci likely reflecting small amount hemorrhagic fluid. Concomitant infected fluid cannot be entirely excluded but given the recent surgery this is likely postsurgical. Surrounding soft tissue edema in the subcutaneous fat which may reflect postsurgical changes versus cellulitis. Electronically Signed   By: Kathreen Devoid M.D.   On: 04/05/2022 07:30   XR Tibia/Fibula Left  Result Date: 04/02/2022 2 view radiographs of the left knee shows no evidence of any destructive bony changes of the proximal tibia.   Microbiology: Recent Results (from the past 240 hour(s))  Surgical pcr screen     Status: None   Collection Time: 04/05/22  8:11 PM   Specimen: Nasal Mucosa; Nasal Swab  Result Value Ref Range Status   MRSA, PCR NEGATIVE NEGATIVE Final   Staphylococcus aureus NEGATIVE NEGATIVE Final    Comment:  (NOTE) The Xpert SA Assay (FDA approved for NASAL specimens in patients 17 years of age and older), is one component of Lakenzie Mcclafferty comprehensive surveillance program. It is not intended to diagnose infection nor to guide or monitor treatment. Performed at [facility], [place] [address], [place], [place] [address]   MRSA Next Gen by PCR, Nasal     Status: None   Collection Time: 04/06/22 12:25 PM   Specimen: Nasal Mucosa; Nasal Swab  Result Value Ref Range Status   MRSA by PCR Next Gen NOT DETECTED NOT DETECTED Final    Comment: (NOTE) The GeneXpert MRSA Assay (FDA approved for NASAL specimens only), is one component of Gerrit Rafalski comprehensive MRSA colonization surveillance program. It is not intended to diagnose MRSA infection nor to guide or monitor treatment for MRSA infections. Test performance is not FDA approved in patients less than 1 years old. Performed at [facility], [place] [address] [place], [place] [address]      Labs: Basic Metabolic Panel: Recent Labs  Lab 04/08/22 0125 04/09/22 1125 04/10/22 0429 04/11/22 0321 04/12/22 0818 04/13/22 0334 04/14/22 0555  NA 129* 134* 135 136 133* 133* 133*  K 4.1 3.7 3.7 3.2* 3.1* 3.6 3.9  CL 97* 96* 96* 96* 94* 93* 97*  CO2 '22 27 28 28 31 29 25  '$ GLUCOSE 130* 164* 139* 115* 123* 118* 124*  BUN 51* 41* 34* 34* 33* 33* 33*  CREATININE 1.56* 1.22 1.15 1.01 1.06 1.06 1.13  CALCIUM 9.1 9.3 9.0 9.2 8.7* 8.8* 8.9  MG 1.9 1.4* 1.7 1.7  --  1.5* 1.6*  PHOS 3.5 3.1 2.9  --   --  2.2* 2.6   Liver Function Tests: Recent Labs  Lab 04/09/22 1125 04/10/22 0429 04/12/22 0818 04/13/22 0334 04/14/22 0555  AST  --   --  '16 18 23  '$ ALT  --   --  '10 10 13  '$ ALKPHOS  --   --  55 64 61  BILITOT  --   --  1.4* 1.2 0.8  PROT  --   --  5.6* 5.6* 5.8*  ALBUMIN 2.7* 2.6* 2.6* 2.6* 2.7*   No results for input(s): "LIPASE", "AMYLASE" in the last 168 hours. No results for input(s): "AMMONIA" in the last 168 hours. CBC: Recent Labs  Lab  04/09/22 1125 04/10/22 0429 04/11/22 0321 04/12/22 0818 04/13/22 0334 04/14/22 0555  WBC 11.9* 15.1* 13.6* 11.6* 10.4 10.4  NEUTROABS 9.7* 12.5* 10.2*  --  7.3 7.1  HGB 11.8* 11.7* 10.2* 10.4* 10.8* 10.9*  HCT 34.3* 34.8* 29.4* 30.6* 31.5* 31.3*  MCV 90.7 92.3 91.6 91.9 91.6 91.0  PLT 237 252 209 217 230 258   Cardiac Enzymes: No results for input(s): "CKTOTAL", "CKMB", "CKMBINDEX", "TROPONINI" in the last 168 hours. BNP: BNP (last 3 results) Recent Labs    09/15/21 1528 11/20/21 1409 04/06/22 1253  BNP 447.0* 485.0* 682.5*    ProBNP (last 3 results) No results for input(s): "PROBNP" in the last 8760 hours.  CBG: No results for input(s): "GLUCAP" in the last 168 hours.     Signed:  Fayrene Helper MD.  [facility] 04/14/2022, 10:03 AM

## 2022-04-14 NOTE — Progress Notes (Signed)
Patient ID: Ryan Garner, male   DOB: March 04, 1946, 76 y.o.   MRN: [id number] Patient is status post debridement abscess left leg on 04/06/22.  There is no drainage in the wound VAC canister. Holding good suction  Plan: Antiobiotics per ID plan, narrowed to doxycycline and levaquin At time of discharge wound vac to transferred to praveena portable vac Renal function stabilized, appreciate medicine team recs TEE needed outpatient per cardiology  Patient continues with diuresis.    The plan is for discharge to skilled nursing for rehab possible today  Vanetta Mulders, [place] [phone]

## 2022-04-14 NOTE — Discharge Summary (Signed)
Patient ID: Ryan Garner MRN: [id number] DOB/AGE: 1946-03-27 76 y.o.  Admit date: 04/04/2022 Discharge date: 04/14/2022  Admission Diagnoses:  Abscess of leg, left  Discharge Diagnoses:  Principal Problem:   Abscess of leg, left Active Problems:   Acute kidney injury superimposed on chronic kidney disease (HCC)   Hyponatremia   Atrial fibrillation (HCC)   Transient hypotension   Bradycardia   (HFpEF) heart failure with preserved ejection fraction (HCC)   Hypotension   Nonrheumatic mitral (valve) insufficiency   Aortic valve stenosis, nonrheumatic   Past Medical History:  Diagnosis Date   A-fib ([place])    Acute on chronic diastolic CHF (congestive heart failure) ([place]) 07/21/2020   Coronary artery disease    Diabetes mellitus without complication (HCC)    Dyspnea    Dysrhythmia    A-fib   Hyperlipidemia    Hypertension    Metabolic syndrome    Prediabetes    Sleep apnea    STEMI (ST elevation myocardial infarction) ([place])     Surgeries: Procedure(s): LEFT LEG DEBRIDEMENT on 04/06/2022   Consultants (if any): Treatment Team:  Eugenie Filler, MD  Discharged Condition: Improved  Hospital Course: Ryan Garner is an 76 y.o. male who was admitted 04/04/2022 with a diagnosis of Abscess of leg, left and went to the operating room on 04/06/2022 and underwent the above named procedures.    He was given perioperative antibiotics:  Anti-infectives (From admission, onward)    Start     Dose/Rate Route Frequency Ordered Stop   04/13/22 0000  doxycycline (VIBRA-TABS) 100 MG tablet        100 mg Oral Every 12 hours 04/13/22 1408     04/13/22 0000  levofloxacin (LEVAQUIN) 750 MG tablet        750 mg Oral Daily at bedtime 04/13/22 1408     04/11/22 2200  levofloxacin (LEVAQUIN) tablet 750 mg        750 mg Oral Daily at bedtime 04/11/22 1022 04/27/22 2359   04/11/22 2200  doxycycline (VIBRA-TABS) tablet 100 mg        100 mg Oral Every 12 hours 04/11/22 1321 04/27/22  2359   04/10/22 2200  ceFEPIme (MAXIPIME) 2 g in sodium chloride 0.9 % 100 mL IVPB  Status:  Discontinued        2 g 200 mL/hr over 30 Minutes Intravenous Every 12 hours 04/10/22 1215 04/11/22 1022   04/09/22 2200  amoxicillin-clavulanate (AUGMENTIN) 875-125 MG per tablet 1 tablet  Status:  Discontinued        1 tablet Oral Every 12 hours 04/09/22 1229 04/09/22 1615   04/09/22 2200  doxycycline (VIBRA-TABS) tablet 100 mg  Status:  Discontinued        100 mg Oral Every 12 hours 04/09/22 1229 04/11/22 1022   04/09/22 2200  ceFEPIme (MAXIPIME) 2 g in sodium chloride 0.9 % 100 mL IVPB  Status:  Discontinued        2 g 200 mL/hr over 30 Minutes Intravenous Every 8 hours 04/09/22 1615 04/10/22 1215   04/06/22 0845  ceFAZolin (ANCEF) IVPB 3g/100 mL premix  Status:  Discontinued        3 g 200 mL/hr over 30 Minutes Intravenous On call to O.R. 04/06/22 0839 04/06/22 1153   04/06/22 0600  vancomycin (VANCOREADY) IVPB 1750 mg/350 mL  Status:  Discontinued        1,750 mg 175 mL/hr over 120 Minutes Intravenous Every 36 hours 04/04/22 1602 04/05/22 1121  04/05/22 2000  metroNIDAZOLE (FLAGYL) tablet 500 mg  Status:  Discontinued        500 mg Oral Every 12 hours 04/05/22 1021 04/09/22 1229   04/05/22 1630  vancomycin (VANCOCIN) IVPB 1000 mg/200 mL premix  Status:  Discontinued        1,000 mg 200 mL/hr over 60 Minutes Intravenous Every 24 hours 04/05/22 1121 04/09/22 1229   04/04/22 1400  cefTRIAXone (ROCEPHIN) 2 g in sodium chloride 0.9 % 100 mL IVPB  Status:  Discontinued        2 g 200 mL/hr over 30 Minutes Intravenous Every 24 hours 04/04/22 1218 04/09/22 1229   04/04/22 1400  metroNIDAZOLE (FLAGYL) IVPB 500 mg  Status:  Discontinued        500 mg 100 mL/hr over 60 Minutes Intravenous Every 8 hours 04/04/22 1218 04/05/22 1021   04/04/22 1400  vancomycin (VANCOCIN) 2,500 mg in sodium chloride 0.9 % 500 mL IVPB        2,500 mg 262.5 mL/hr over 120 Minutes Intravenous  Once 04/04/22 1312 04/04/22  1800   04/04/22 0800  ceFAZolin (ANCEF) IVPB 3g/100 mL premix        3 g 200 mL/hr over 30 Minutes Intravenous On call to O.R. 04/04/22 0746 04/04/22 1002     .  He was given sequential compression devices, early ambulation, and appropriate chemoprophylaxis for DVT prophylaxis.  He benefited maximally from the hospital stay and there were no complications.    Recent vital signs:  Vitals:   04/14/22 0324 04/14/22 0718  BP: 116/77 111/70  Pulse: 86 87  Resp: 20 19  Temp: 98 F (36.7 C) 97.9 F (36.6 C)  SpO2: 97% 98%    Recent laboratory studies:  Lab Results  Component Value Date   HGB 10.9 (L) 04/14/2022   HGB 10.8 (L) 04/13/2022   HGB 10.4 (L) 04/12/2022   Lab Results  Component Value Date   WBC 10.4 04/14/2022   PLT 258 04/14/2022   Lab Results  Component Value Date   INR 1.6 (H) 03/01/2022   Lab Results  Component Value Date   NA 133 (L) 04/14/2022   K 3.9 04/14/2022   CL 97 (L) 04/14/2022   CO2 25 04/14/2022   BUN 33 (H) 04/14/2022   CREATININE 1.13 04/14/2022   GLUCOSE 124 (H) 04/14/2022    Discharge Medications:   Allergies as of 04/14/2022       Reactions   Flecainide    Per patient dizziness, shortness of breath, blurred vision   Rofecoxib Rash, Other (See Comments)   (Vioxx)        Medication List     STOP taking these medications    albuterol 108 (90 Base) MCG/ACT inhaler Commonly known as: VENTOLIN HFA   sulfamethoxazole-trimethoprim 800-160 MG tablet Commonly known as: BACTRIM DS   traMADol 50 MG tablet Commonly known as: ULTRAM       TAKE these medications    apixaban 5 MG Tabs tablet Commonly known as: ELIQUIS Take 1 tablet (5 mg total) by mouth 2 (two) times daily.   ascorbic acid 1000 MG tablet Commonly known as: VITAMIN C Take 1 tablet (1,000 mg total) by mouth daily.   atorvastatin 80 MG tablet Commonly known as: LIPITOR Take 1 tablet by mouth once daily   clopidogrel 75 MG tablet Commonly known as:  PLAVIX Take 1 tablet (75 mg total) by mouth daily with breakfast.   cyanocobalamin 1000 MCG tablet Commonly known as: VITAMIN B12  Take 1,000 mcg by mouth daily.   doxycycline 100 MG tablet Commonly known as: VIBRA-TABS Take 1 tablet (100 mg total) by mouth every 12 (twelve) hours.   furosemide 80 MG tablet Commonly known as: LASIX Take 1 tablet (80 mg total) by mouth 2 (two) times daily. What changed:  medication strength when to take this   levofloxacin 750 MG tablet Commonly known as: LEVAQUIN Take 1 tablet (750 mg total) by mouth at bedtime.   methocarbamol 500 MG tablet Commonly known as: ROBAXIN Take 1 tablet (500 mg total) by mouth every 6 (six) hours as needed for muscle spasms.   metoprolol tartrate 50 MG tablet Commonly known as: LOPRESSOR Take 1 tablet (50 mg total) by mouth 2 (two) times daily.   multivitamin with minerals Tabs tablet Take 1 tablet by mouth daily.   nitroGLYCERIN 0.4 MG SL tablet Commonly known as: NITROSTAT Place 1 tablet (0.4 mg total) under the tongue every 5 (five) minutes x 3 doses as needed for chest pain.   nutrition supplement (JUVEN) Pack Take 1 packet by mouth 2 (two) times daily between meals.   Oxycodone HCl 10 MG Tabs Take 1-1.5 tablets (10-15 mg total) by mouth every 4 (four) hours as needed for severe pain (pain score 7-10).   potassium chloride SA 20 MEQ tablet Commonly known as: Klor-Con M20 Take 40 meq on Monday,Wednesday,Friday, take 20 meq all other days   Prostate Support 300-15 MG Tabs Take 1 tablet by mouth daily. Super Beta Prostate   Spiriva Respimat 1.25 MCG/ACT Aers Generic drug: Tiotropium Bromide Monohydrate Inhale 2 puffs into the lungs daily.   zinc sulfate 220 (50 Zn) MG capsule Take 1 capsule (220 mg total) by mouth daily.               Discharge Care Instructions  (From admission, onward)           Start     Ordered   04/12/22 0000  Weight bearing as tolerated       Question Answer  Comment  Laterality left   Extremity Lower      04/12/22 1201   04/12/22 0000  Change dressing       Comments: Dry dressing change left leg, change daily, and wash with soap and water.   04/12/22 1201            Diagnostic Studies: ECHOCARDIOGRAM LIMITED  Result Date: 04/12/2022    ECHOCARDIOGRAM LIMITED REPORT   Patient Name:   Ryan Garner Date of Exam: 04/12/2022 Medical Rec #:  [id number]      Height:       68.0 in Accession #:    [id number]     Weight:       291.1 lb Date of Birth:  Feb 21, 1946       BSA:          2.397 m Patient Age:    2 years       BP:           107/60 mmHg Patient Gender: M              HR:           89 bpm. Exam Location:  Inpatient Procedure: Limited Echo, Cardiac Doppler and Color Doppler Indications:    Mitral valve insufficiency  History:        Patient has prior history of Echocardiogram examinations, most  recent 04/07/2022. CHF, Previous Myocardial Infarction and CAD,                 Arrythmias:Atrial Fibrillation, Signs/Symptoms:Dyspnea; Risk                 Factors:Sleep Apnea, Hypertension, Dyslipidemia and Diabetes.  Sonographer:    Eartha Inch Referring Phys: [id number] [place]  Sonographer Comments: Technically challenging study due to limited acoustic windows. Image acquisition challenging due to patient body habitus. IMPRESSIONS  1. Very limited study to evaluated mitral regurgitation. Severe mitral valve regurgitation appears to be related to restricted PMVL. Jet is posteriorly directed. Unchanged from prior. The mitral valve is abnormal. Severe mitral valve regurgitation. No evidence of mitral stenosis.  2. Left ventricular ejection fraction, by estimation, is 50 to 55%. The left ventricle has low normal function. The left ventricle demonstrates global hypokinesis. There is the interventricular septum is flattened in systole and diastole, consistent with right ventricular pressure and volume overload.  3. Right ventricular systolic  function was not well visualized. The right ventricular size is not well visualized. Comparison(s): No significant change from prior study. FINDINGS  Left Ventricle: Left ventricular ejection fraction, by estimation, is 50 to 55%. The left ventricle has low normal function. The left ventricle demonstrates global hypokinesis. The interventricular septum is flattened in systole and diastole, consistent  with right ventricular pressure and volume overload. Right Ventricle: The right ventricular size is not well visualized. Right vetricular wall thickness was not well visualized. Right ventricular systolic function was not well visualized. Pericardium: There is no evidence of pericardial effusion. Mitral Valve: Very limited study to evaluated mitral regurgitation. Severe mitral valve regurgitation appears to be related to restricted PMVL. Jet is posteriorly directed. Unchanged from prior. The mitral valve is abnormal. Severe mitral valve regurgitation. No evidence of mitral valve stenosis. MR Peak grad:    86.1 mmHg MR Mean grad:    63.0 mmHg MR Vmax:         464.00 cm/s MR Vmean:        380.0 cm/s MR PISA:         1.57 cm MR PISA Eff ROA: 15 mm MR PISA Radius:  0.50 cm Eleonore Chiquito MD Electronically signed by Eleonore Chiquito MD Signature Date/Time: 04/12/2022/1:38:43 PM    Final    US RENAL  Result Date: 04/07/2022 CLINICAL DATA:  Acute renal failure EXAM: RENAL / URINARY TRACT ULTRASOUND COMPLETE COMPARISON:  None Available. FINDINGS: Right Kidney: Renal measurements: 10.1 x 5.2 x 6.1 cm = volume: 166 mL. Cortical thinning. Echogenicity within normal limits. No mass or hydronephrosis visualized. Left Kidney: Renal measurements: 11.6 x 5.9 x 5.9 cm = volume: 210 mL. Cortical thinning. Echogenicity within normal limits. No mass or hydronephrosis visualized. Bladder: Not visualized. Other: None. IMPRESSION: 1. Bilateral renal cortical thinning, in keeping with chronic medical renal disease. 2. No hydronephrosis. 3.  Bladder is nonvisualized, presumably due to decompression. Electronically Signed   By: Delanna Ahmadi M.D.   On: 04/07/2022 15:55   ECHOCARDIOGRAM COMPLETE  Result Date: 04/07/2022    ECHOCARDIOGRAM REPORT   Patient Name:   Ryan Garner Date of Exam: 04/07/2022 Medical Rec #:  [id number]      Height:       68.0 in Accession #:    [id number]     Weight:       296.0 lb Date of Birth:  [birth year]       BSA:  2.414 m Patient Age:    6 years       BP:           102/76 mmHg Patient Gender: M              HR:           67 bpm. Exam Location:  Inpatient Procedure: 2D Echo, Cardiac Doppler and Color Doppler Indications:    Brief cardiac arrest following wound debridement  History:        Patient has prior history of Echocardiogram examinations, most                 recent 06/02/2021. CHF, CAD and Previous Myocardial Infarction,                 Arrythmias:Atrial Fibrillation; Risk Factors:Hypertension,                 Diabetes and Dyslipidemia.  Sonographer:    Merrie Roof RDCS Referring Phys: Berniece Salines  Sonographer Comments: Patient is obese. Image acquisition challenging due to patient body habitus and patient supine. IMPRESSIONS  1. Left ventricular ejection fraction, by estimation, is 50 to 55%. The left ventricle has mildly decreased function. The left ventricle has no regional wall motion abnormalities. There is mild concentric left ventricular hypertrophy. Left ventricular diastolic function could not be evaluated. There is the interventricular septum is flattened in systole and diastole, consistent with right ventricular pressure and volume overload.  2. Right ventricular systolic function is moderately reduced. The right ventricular size is moderately enlarged. There is severely elevated pulmonary artery systolic pressure. The estimated right ventricular systolic pressure is 48.8 mmHg.  3. Left atrial size was severely dilated.  4. Right atrial size was severely dilated.  5. The mitral valve is grossly  normal. Severe mitral valve regurgitation. Moderate mitral annular calcification.  6. The aortic valve is tricuspid. There is severe calcifcation of the aortic valve. There is severe thickening of the aortic valve. Aortic valve regurgitation is not visualized. Moderate aortic valve stenosis. Aortic valve mean gradient measures 17.0 mmHg. Aortic valve Vmax measures 2.71 m/s. Aortic valve acceleration time measures 120 msec.  7. Aortic dilatation noted. There is mild dilatation of the ascending aorta, measuring 41 mm. There is mild dilatation of the aortic root, measuring 39 mm.  8. The inferior vena cava is dilated in size with <50% respiratory variability, suggesting right atrial pressure of 15 mmHg. Comparison(s): Prior images reviewed side by side. The left ventricular function is worsened. Mitral insufficiency is worse. FINDINGS  Left Ventricle: Left ventricular ejection fraction, by estimation, is 50 to 55%. The left ventricle has mildly decreased function. The left ventricle has no regional wall motion abnormalities. The left ventricular internal cavity size was normal in size. There is mild concentric left ventricular hypertrophy. The interventricular septum is flattened in systole and diastole, consistent with right ventricular pressure and volume overload. Left ventricular diastolic function could not be evaluated due to atrial fibrillation. Left ventricular diastolic function could not be evaluated. Right Ventricle: The right ventricular size is moderately enlarged. Right vetricular wall thickness was not well visualized. Right ventricular systolic function is moderately reduced. There is severely elevated pulmonary artery systolic pressure. The tricuspid regurgitant velocity is 4.19 m/s, and with an assumed right atrial pressure of 15 mmHg, the estimated right ventricular systolic pressure is 89.1 mmHg. Left Atrium: Left atrial size was severely dilated. Right Atrium: Right atrial size was severely  dilated. Pericardium: There is no evidence of  pericardial effusion. Mitral Valve: The mitral valve is grossly normal. Moderate mitral annular calcification. Severe mitral valve regurgitation. Tricuspid Valve: The tricuspid valve is normal in structure. Tricuspid valve regurgitation is mild. Aortic Valve: Gradients underestimate the aortic stenosis severity due to low stroke volume. The aortic valve is tricuspid. There is severe calcifcation of the aortic valve. There is severe thickening of the aortic valve. Aortic valve regurgitation is not visualized. Moderate aortic stenosis is present. Aortic valve mean gradient measures 17.0 mmHg. Aortic valve peak gradient measures 29.4 mmHg. Aortic valve area, by VTI measures 1.23 cm. Pulmonic Valve: The pulmonic valve was grossly normal. Pulmonic valve regurgitation is not visualized. Aorta: Aortic dilatation noted. There is mild dilatation of the ascending aorta, measuring 41 mm. There is mild dilatation of the aortic root, measuring 39 mm. Venous: A pattern of systolic flow reversal, suggestive of severe mitral regurgitation is recorded from the left upper pulmonary vein. The inferior vena cava is dilated in size with less than 50% respiratory variability, suggesting right atrial pressure of 15 mmHg. IAS/Shunts: No atrial level shunt detected by color flow Doppler.  LEFT VENTRICLE PLAX 2D LVIDd:         5.40 cm   Diastology LVIDs:         4.00 cm   LV e' lateral: 12.00 cm/s LV PW:         1.20 cm LV IVS:        1.20 cm LVOT diam:     2.40 cm LV SV:         74 LV SV Index:   31 LVOT Area:     4.52 cm  RIGHT VENTRICLE            IVC RV Basal diam:  4.70 cm    IVC diam: 2.50 cm RV Mid diam:    3.30 cm RV S prime:     6.09 cm/s TAPSE (M-mode): 1.2 cm LEFT ATRIUM              Index        RIGHT ATRIUM           Index LA diam:        5.20 cm  2.15 cm/m   RA Area:     28.40 cm LA Vol (A2C):   115.0 ml 47.63 ml/m  RA Volume:   95.20 ml  39.43 ml/m LA Vol (A4C):   107.0 ml  44.32 ml/m LA Biplane Vol: 114.0 ml 47.22 ml/m  AORTIC VALVE AV Area (Vmax):    1.38 cm AV Area (Vmean):   1.25 cm AV Area (VTI):     1.23 cm AV Vmax:           271.00 cm/s AV Vmean:          197.000 cm/s AV VTI:            0.602 m AV Peak Grad:      29.4 mmHg AV Mean Grad:      17.0 mmHg LVOT Vmax:         82.51 cm/s LVOT Vmean:        54.523 cm/s LVOT VTI:          0.163 m LVOT/AV VTI ratio: 0.27  AORTA Ao Root diam: 3.90 cm Ao Asc diam:  4.00 cm MR Peak grad:    66.9 mmHg    TRICUSPID VALVE MR Mean grad:    47.0 mmHg    TR Peak grad:   70.2  mmHg MR Vmax:         409.00 cm/s  TR Vmax:        419.00 cm/s MR Vmean:        330.0 cm/s MR PISA:         8.31 cm     SHUNTS MR PISA Eff ROA: 68 mm       Systemic VTI:  0.16 m MR PISA Radius:  1.15 cm      Systemic Diam: 2.40 cm Dani Gobble Croitoru MD Electronically signed by Sanda Klein MD Signature Date/Time: 04/07/2022/2:16:06 PM    Final    DG CHEST PORT 1 VIEW  Result Date: 04/06/2022 CLINICAL DATA:  Respiratory abnormalities. Patient coded in the OR and now has chest pain with shortness of breath. EXAM: PORTABLE CHEST 1 VIEW COMPARISON:  03/01/2022 FINDINGS: Lungs are hypoinflated demonstrate airspace opacification over the right mid to lower lung which may be due to atelectasis or infection. Possible small amount right pleural fluid. Mild stable cardiomegaly. Remainder of the exam is unchanged. IMPRESSION: 1. Hazy opacification over the right mid to lower lung which may be due to atelectasis or infection. Possible small amount right pleural fluid. 2. Mild stable cardiomegaly. Electronically Signed   By: Marin Olp M.D.   On: 04/06/2022 11:41   MR TIBIA FIBULA LEFT WO CONTRAST  Result Date: 04/05/2022 CLINICAL DATA:  Soft tissue infection. Recent debridement 04/04/2022. EXAM: MRI OF LOWER LEFT EXTREMITY WITHOUT CONTRAST TECHNIQUE: Multiplanar, multisequence MR imaging of the left lower leg was performed. No intravenous contrast was administered.  COMPARISON:  None Available. FINDINGS: Bones/Joint/Cartilage No fracture or dislocation. Normal alignment. No joint effusion. No marrow signal abnormality. Ligaments Collateral ligaments are intact. Muscles and Tendons Flexor, peroneal and extensor compartment tendons are intact. Atrophy of the distal medial gastrocnemius muscle. Mild atrophy of the remainder of the left lower leg muscles. Soft tissue 13.3 x 4 x 3 cm soft tissue wound involving the proximal anterior left lower leg with an overlying wound VAC. Heterogeneous fluid collection measuring 4 x 3 cm at the site of debridement extending down to the anterior compartment fascia with small low signal foci likely reflecting small amount hemorrhagic fluid. Surrounding soft tissue edema in the subcutaneous fat which may reflect postsurgical changes versus cellulitis. No soft tissue mass. Generalized soft tissue edema throughout bilateral lower legs likely secondary to fluid overload IMPRESSION: 1. 13.3 x 4 x 3 cm soft tissue wound involving the proximal anterior left lower leg with an overlying wound VAC. Heterogeneous fluid collection measuring 4 x 3 cm at the site of debridement extending down to the anterior compartment fascia with small low signal foci likely reflecting small amount hemorrhagic fluid. Concomitant infected fluid cannot be entirely excluded but given the recent surgery this is likely postsurgical. Surrounding soft tissue edema in the subcutaneous fat which may reflect postsurgical changes versus cellulitis. Electronically Signed   By: Kathreen Devoid M.D.   On: 04/05/2022 07:30   XR Tibia/Fibula Left  Result Date: 04/02/2022 2 view radiographs of the left knee shows no evidence of any destructive bony changes of the proximal tibia.   Disposition: Discharge disposition: 03-Skilled Nursing Facility       Discharge Instructions     Change dressing   Complete by: As directed    Dry dressing change left leg, change daily, and wash with  soap and water.   Weight bearing as tolerated   Complete by: As directed    Laterality: left   Extremity: Lower  Follow-up Information     Newt Minion, MD Follow up in 1 week(s).   Specialty: Orthopedic Surgery Contact information: [place] [address] [phone]         Laurice Record, MD Follow up.   Specialty: Infectious Diseases Why: 04/27/22 at 10:30am. Please all to reschedule if you cannot make this appointment. Contact information: [address], [place] [address] [phone]         Leanor Kail, [place] Follow up.   Specialty: Cardiology Why: Hospital follow-up with Cardiology scheduled for 04/26/2022 at 3:10pm. Please arrive 15 minutes early for check-in. If this date/time does not work for you, please call our office to reschedule. Contact information: [address] [place] [address] [phone]                  Signed: Vanetta Mulders 04/14/2022, 7:35 AM

## 2022-04-14 NOTE — Progress Notes (Signed)
Rounding Note    Patient Name: Ryan Garner Date of Encounter: 04/14/2022  [place] Cardiologist: Dorris Carnes, MD   Subjective   Sitting in chair eating breakfast Going to rehab in [facility]   Inpatient Medications    Scheduled Meds:  apixaban  5 mg Oral BID   vitamin C  1,000 mg Oral Daily   atorvastatin  80 mg Oral Daily   clopidogrel  75 mg Oral Q breakfast   docusate sodium  100 mg Oral BID   doxycycline  100 mg Oral Q12H   furosemide  80 mg Oral BID   levofloxacin  750 mg Oral QHS   metoprolol tartrate  25 mg Oral BID   nutrition supplement (JUVEN)  1 packet Oral BID BM   pantoprazole  40 mg Oral Daily   potassium chloride  20 mEq Oral TID   zinc sulfate  220 mg Oral Daily   Continuous Infusions:  magnesium sulfate bolus IVPB     methocarbamol (ROBAXIN) IV     PRN Meds: acetaminophen, alum & mag hydroxide-simeth, bisacodyl, bisacodyl, guaiFENesin-dextromethorphan, hydrALAZINE, HYDROmorphone (DILAUDID) injection, labetalol, levalbuterol, magnesium citrate, methocarbamol **OR** methocarbamol (ROBAXIN) IV, metoCLOPramide **OR** metoCLOPramide (REGLAN) injection, metoprolol tartrate, ondansetron **OR** ondansetron (ZOFRAN) IV, oxyCODONE, oxyCODONE, phenol, polyethylene glycol   Vital Signs    Vitals:   04/13/22 2306 04/14/22 0324 04/14/22 0330 04/14/22 0718  BP: 112/80 116/77  111/70  Pulse: 94 86  87  Resp: '20 20  19  '$ Temp: 97.8 F (36.6 C) 98 F (36.7 C)  97.9 F (36.6 C)  TempSrc: Oral Oral  Oral  SpO2: 96% 97%  98%  Weight:   126 kg   Height:        Intake/Output Summary (Last 24 hours) at 04/14/2022 0946 Last data filed at 04/14/2022 0647 Gross per 24 hour  Intake 480 ml  Output 2100 ml  Net -1620 ml      04/14/2022    3:30 AM 04/13/2022    3:16 AM 04/12/2022    3:25 AM  Last 3 Weights  Weight (lbs) 277 lb 12.5 oz 278 lb 3.5 oz 291 lb 1.6 oz  Weight (kg) 126 kg 126.2 kg 132.042 kg      Telemetry    Atrial fibrillation with rates  mostly in the 90s. Occasionally briefly in the 110s to 120s. Some PVCs noted. - Personally Reviewed  ECG    Afib nonspecific ST changes   Physical Exam   GEN: No acute distress.   Obese male Lungs clear AS murmur hard to appreciate MR murmur  Abdomen benign Plus 3 LE edema ? Lymphedema with JP drain LLE  Labs    High Sensitivity Troponin:   Recent Labs  Lab 04/06/22 1253 04/06/22 1451  TROPONINIHS 21* 27*     Chemistry Recent Labs  Lab 04/11/22 0321 04/12/22 0818 04/13/22 0334 04/14/22 0555  NA 136 133* 133* 133*  K 3.2* 3.1* 3.6 3.9  CL 96* 94* 93* 97*  CO2 '28 31 29 25  '$ GLUCOSE 115* 123* 118* 124*  BUN 34* 33* 33* 33*  CREATININE 1.01 1.06 1.06 1.13  CALCIUM 9.2 8.7* 8.8* 8.9  MG 1.7  --  1.5* 1.6*  PROT  --  5.6* 5.6* 5.8*  ALBUMIN  --  2.6* 2.6* 2.7*  AST  --  '16 18 23  '$ ALT  --  '10 10 13  '$ ALKPHOS  --  55 64 61  BILITOT  --  1.4* 1.2 0.8  GFRNONAA >  60 >60 >60 >60  ANIONGAP '12 8 11 11    '$ Lipids No results for input(s): "CHOL", "TRIG", "HDL", "LABVLDL", "LDLCALC", "CHOLHDL" in the last 168 hours.  Hematology Recent Labs  Lab 04/12/22 0818 04/13/22 0334 04/14/22 0555  WBC 11.6* 10.4 10.4  RBC 3.33* 3.44* 3.44*  HGB 10.4* 10.8* 10.9*  HCT 30.6* 31.5* 31.3*  MCV 91.9 91.6 91.0  MCH 31.2 31.4 31.7  MCHC 34.0 34.3 34.8  RDW 15.0 14.8 14.8  PLT 217 230 258   Thyroid No results for input(s): "TSH", "FREET4" in the last 168 hours.  BNP No results for input(s): "BNP", "PROBNP" in the last 168 hours.   DDimer No results for input(s): "DDIMER" in the last 168 hours.   Radiology    ECHOCARDIOGRAM LIMITED  Result Date: 04/12/2022    ECHOCARDIOGRAM LIMITED REPORT   Patient Name:   CEASER EBELING Date of Exam: 04/12/2022 Medical Rec #:  [id number]      Height:       68.0 in Accession #:    [id number]     Weight:       291.1 lb Date of Birth:  Apr 02, 1946       BSA:          2.397 m Patient Age:    76 years       BP:           107/60 mmHg Patient Gender: M               HR:           89 bpm. Exam Location:  Inpatient Procedure: Limited Echo, Cardiac Doppler and Color Doppler Indications:    Mitral valve insufficiency  History:        Patient has prior history of Echocardiogram examinations, most                 recent 04/07/2022. CHF, Previous Myocardial Infarction and CAD,                 Arrythmias:Atrial Fibrillation, Signs/Symptoms:Dyspnea; Risk                 Factors:Sleep Apnea, Hypertension, Dyslipidemia and Diabetes.  Sonographer:    Eartha Inch Referring Phys: [id number] [facility]  Sonographer Comments: Technically challenging study due to limited acoustic windows. Image acquisition challenging due to patient body habitus. IMPRESSIONS  1. Very limited study to evaluated mitral regurgitation. Severe mitral valve regurgitation appears to be related to restricted PMVL. Jet is posteriorly directed. Unchanged from prior. The mitral valve is abnormal. Severe mitral valve regurgitation. No evidence of mitral stenosis.  2. Left ventricular ejection fraction, by estimation, is 50 to 55%. The left ventricle has low normal function. The left ventricle demonstrates global hypokinesis. There is the interventricular septum is flattened in systole and diastole, consistent with right ventricular pressure and volume overload.  3. Right ventricular systolic function was not well visualized. The right ventricular size is not well visualized. Comparison(s): No significant change from prior study. FINDINGS  Left Ventricle: Left ventricular ejection fraction, by estimation, is 50 to 55%. The left ventricle has low normal function. The left ventricle demonstrates global hypokinesis. The interventricular septum is flattened in systole and diastole, consistent  with right ventricular pressure and volume overload. Right Ventricle: The right ventricular size is not well visualized. Right vetricular wall thickness was not well visualized. Right ventricular systolic function was not  well visualized. Pericardium: There is no evidence of pericardial effusion. Mitral  Valve: Very limited study to evaluated mitral regurgitation. Severe mitral valve regurgitation appears to be related to restricted PMVL. Jet is posteriorly directed. Unchanged from prior. The mitral valve is abnormal. Severe mitral valve regurgitation. No evidence of mitral valve stenosis. MR Peak grad:    86.1 mmHg MR Mean grad:    63.0 mmHg MR Vmax:         464.00 cm/s MR Vmean:        380.0 cm/s MR PISA:         1.57 cm MR PISA Eff ROA: 15 mm MR PISA Radius:  0.50 cm Eleonore Chiquito MD Electronically signed by Eleonore Chiquito MD Signature Date/Time: 04/12/2022/1:38:43 PM    Final     Cardiac Studies   Echocardiogram 04/07/2022: Impressions: 1. Left ventricular ejection fraction, by estimation, is 50 to 55%. The  left ventricle has mildly decreased function. The left ventricle has no  regional wall motion abnormalities. There is mild concentric left  ventricular hypertrophy. Left ventricular  diastolic function could not be evaluated. There is the interventricular  septum is flattened in systole and diastole, consistent with right  ventricular pressure and volume overload.   2. Right ventricular systolic function is moderately reduced. The right  ventricular size is moderately enlarged. There is severely elevated  pulmonary artery systolic pressure. The estimated right ventricular  systolic pressure is 18.8 mmHg.   3. Left atrial size was severely dilated.   4. Right atrial size was severely dilated.   5. The mitral valve is grossly normal. Severe mitral valve regurgitation.  Moderate mitral annular calcification.   6. The aortic valve is tricuspid. There is severe calcifcation of the  aortic valve. There is severe thickening of the aortic valve. Aortic valve  regurgitation is not visualized. Moderate aortic valve stenosis. Aortic  valve mean gradient measures 17.0  mmHg. Aortic valve Vmax measures 2.71 m/s.  Aortic valve acceleration time  measures 120 msec.   7. Aortic dilatation noted. There is mild dilatation of the ascending  aorta, measuring 41 mm. There is mild dilatation of the aortic root,  measuring 39 mm.   8. The inferior vena cava is dilated in size with <50% respiratory  variability, suggesting right atrial pressure of 15 mmHg.   Comparison(s): Prior images reviewed side by side. The left ventricular  function is worsened. Mitral insufficiency is worse.    Patient Profile     76 y.o. male with a history of CAD s/p STEMI in 2021 s/p DES to LCX, chronic diastolic CHF paroxysmal atrial fibrillation on Eliquis, hypertension, hyperlipidemia, type 2 diabetes mellitus, sleep apnea who presented to [facility] on 04/04/2022 for left leg excision debridement of a chronic abscess. Immediately after intubation, patient became bradycardic and hypotension but no mention of loss of pulses. He underwent resuscitation and responded very well. Decision was made to proceed with procedure and Cardiology was consulted afterwards for further evaluation.  Assessment & Plan    Episode of Bradycardia and Hypotension Following intubation and induction of anesthesia. Received transient resuscitative efforts in the OR and was able to complete operation. - No recurrent events.  - Tolerating Lopressor '25mg'$  twice daily well.    Acute on Chronic Diastolic CHF BNP elevated at 682. Chest x-ray showed mild stable cardiomegaly with hazy opacification over the right mid to lower lung which may be due to atelectasis or infection as well as possible small right pleural effusion. Echo showed LVEF of 50-55% with no regional wall motion abnormalities, moderately enlarged RV  with moderately reduced systolic function, severe biatrial enlargement, severe MR, moderate AS, and severe pulmonary hypertension with RVSP of 85.2 mmHg. Initially diuresed with IV Lasix but switched to PO Lasix yesterday. Documented urinary output of 5.35  mL in the last 24 hours and net negative 26.8 L this admission. Weight down 18 lbs since admission if recorder weights are accurate. Renal function stable.  - Still has significant lower extremity edema but responding well to oral Lasix. - Continue PO Lasix '80mg'$  twice daily. - BP soft at times so not adding Spironolactone.  - No SGLT2 with obesity and risk of infection     Severe Mitral Regurgitation Moderate Aortic Stenosis Noted on Echo this admission. Severe MR was a new finding. Per MD, transthoracic echo image quality is not great and is difficult to establish the etiology. Differential diagnosis includes endocarditis (possible skin source), ischemic tethering, and even transient myocardial stunning following brief arrest.  Repeat limited Echo on 9/21 showed severe MR related to restricted PMVL with posterior directed jet unchanged from prior exam. - Continue medical therapy for now. - Will ultimately need TEE and right/left cardiac catheterization for work-up of mitral valve repair after acute infection has been treated.  Demand Ischemia CAD History of STEMI s/p DES to LCX in 2021. High-sensitivity troponin minimally elevated at 21 >> 27 consistent with demand ischemia in setting of acute CHF and brief arrest. - He has some mild chest soreness from brief CPR but no other chest pain.  - Continue Plavix '75mg'$  daily. Not on aspirin due to need for DOAC. - Continue beta-blocker and high-intensity statin.  Severe Pulmonary Hypertension Echo this admission showed RVSP of 85.2 mmHg with moderately reduced RV function. Felt to largely be due to left heart disease and probably also due to obstructive sleep apnea.  - Will ultimately need R/LHC after treatment of infection.   Permanent Atrial Fibrillation Rates mostly in the 90s. - Continue Lopressor '25mg'$  twice daily. Previously on '50mg'$  twice daily at home. Will hold off on up-titrating this due to soft BP at times. - Continue Eliquis '5mg'$  twice  daily.   Hyperlipidemia - Continue Lipitor '80mg'$  daily.   Type 2 Diabetes Mellitus - Management per primary team.   Acute on CKD Stage III Creatinine 1.71 on admission and peaked at 1.84. Baseline around 1.2 to 1.5.  - Improving with diuresis. Creatinine 1.06 today. - Continue to monitor. - Would repeat BMET in 1 week at SNF.   Hyponatremia  Sodium as low as 127 on 9/15.  - Improved with diuresis and stable at 133 today.  Hypomagnesemia Magnesium 1.6 today.   Cellulitis Leg Abscess S/p debridement on 9/13. Of note, culture of abscess grew Serratia.  - Management per primary team. - on levaquin and doxycycline   Disposition: D/c to SNF in [place] today ? Will arrange outpatient f/u with Dr Harrington Challenger  For questions or updates, please contact [place] Please consult [email or web address] for contact info under       Signed, Jenkins Rouge, MD  04/14/2022, 9:46 AM

## 2022-04-16 ENCOUNTER — Telehealth: Payer: Self-pay

## 2022-04-16 DIAGNOSIS — Z9189 Other specified personal risk factors, not elsewhere classified: Secondary | ICD-10-CM | POA: Diagnosis not present

## 2022-04-16 DIAGNOSIS — I959 Hypotension, unspecified: Secondary | ICD-10-CM | POA: Diagnosis not present

## 2022-04-16 DIAGNOSIS — I5033 Acute on chronic diastolic (congestive) heart failure: Secondary | ICD-10-CM | POA: Diagnosis not present

## 2022-04-16 DIAGNOSIS — R001 Bradycardia, unspecified: Secondary | ICD-10-CM | POA: Diagnosis not present

## 2022-04-16 DIAGNOSIS — L03116 Cellulitis of left lower limb: Secondary | ICD-10-CM | POA: Diagnosis not present

## 2022-04-16 DIAGNOSIS — Z9889 Other specified postprocedural states: Secondary | ICD-10-CM | POA: Diagnosis not present

## 2022-04-16 DIAGNOSIS — I340 Nonrheumatic mitral (valve) insufficiency: Secondary | ICD-10-CM | POA: Diagnosis not present

## 2022-04-16 DIAGNOSIS — R600 Localized edema: Secondary | ICD-10-CM | POA: Diagnosis not present

## 2022-04-16 DIAGNOSIS — R52 Pain, unspecified: Secondary | ICD-10-CM | POA: Diagnosis not present

## 2022-04-16 NOTE — Telephone Encounter (Signed)
This pt was d/c from the hospital on 04/14/22 to [place]. He is s/p deb LLE with vac and needs follow up in the office on Friday with Erin. Can you please call to sch. He is on the sch for 05/01/22 but that is too far out. He needs to be seen Friday. Let me know! Thanks

## 2022-04-18 DIAGNOSIS — N189 Chronic kidney disease, unspecified: Secondary | ICD-10-CM | POA: Diagnosis not present

## 2022-04-18 DIAGNOSIS — I5033 Acute on chronic diastolic (congestive) heart failure: Secondary | ICD-10-CM | POA: Diagnosis not present

## 2022-04-18 DIAGNOSIS — L03116 Cellulitis of left lower limb: Secondary | ICD-10-CM | POA: Diagnosis not present

## 2022-04-18 DIAGNOSIS — I340 Nonrheumatic mitral (valve) insufficiency: Secondary | ICD-10-CM | POA: Diagnosis not present

## 2022-04-18 DIAGNOSIS — R600 Localized edema: Secondary | ICD-10-CM | POA: Diagnosis not present

## 2022-04-19 DIAGNOSIS — I959 Hypotension, unspecified: Secondary | ICD-10-CM | POA: Diagnosis not present

## 2022-04-19 DIAGNOSIS — L03116 Cellulitis of left lower limb: Secondary | ICD-10-CM | POA: Diagnosis not present

## 2022-04-19 DIAGNOSIS — R001 Bradycardia, unspecified: Secondary | ICD-10-CM | POA: Diagnosis not present

## 2022-04-19 DIAGNOSIS — N179 Acute kidney failure, unspecified: Secondary | ICD-10-CM | POA: Diagnosis not present

## 2022-04-19 DIAGNOSIS — I5033 Acute on chronic diastolic (congestive) heart failure: Secondary | ICD-10-CM | POA: Diagnosis not present

## 2022-04-19 DIAGNOSIS — R600 Localized edema: Secondary | ICD-10-CM | POA: Diagnosis not present

## 2022-04-19 DIAGNOSIS — Z9889 Other specified postprocedural states: Secondary | ICD-10-CM | POA: Diagnosis not present

## 2022-04-19 DIAGNOSIS — Z9189 Other specified personal risk factors, not elsewhere classified: Secondary | ICD-10-CM | POA: Diagnosis not present

## 2022-04-19 DIAGNOSIS — N189 Chronic kidney disease, unspecified: Secondary | ICD-10-CM | POA: Diagnosis not present

## 2022-04-19 DIAGNOSIS — I340 Nonrheumatic mitral (valve) insufficiency: Secondary | ICD-10-CM | POA: Diagnosis not present

## 2022-04-19 DIAGNOSIS — R52 Pain, unspecified: Secondary | ICD-10-CM | POA: Diagnosis not present

## 2022-04-20 ENCOUNTER — Ambulatory Visit (INDEPENDENT_AMBULATORY_CARE_PROVIDER_SITE_OTHER): Payer: Medicare Other | Admitting: Family

## 2022-04-20 DIAGNOSIS — Z9889 Other specified postprocedural states: Secondary | ICD-10-CM

## 2022-04-20 DIAGNOSIS — L02416 Cutaneous abscess of left lower limb: Secondary | ICD-10-CM

## 2022-04-21 ENCOUNTER — Encounter: Payer: Self-pay | Admitting: Family

## 2022-04-21 NOTE — Progress Notes (Signed)
Post-Op Visit Note   Patient: Ryan Garner           Date of Birth: 10-Oct-1945           MRN: [id number] Visit Date: 04/20/2022 PCP: [name], [name], MD  Chief Complaint:  Chief Complaint  Patient presents with   Left Leg - Routine Post Op    04/06/2022 LLE debridement kerecis graft     HPI:  HPI The patient is a 76 year old gentleman who is seen status post debridement of the left lower extremity.  Wound VAC removed today. Ortho Exam on examination of the left lower extremity there is significant pitting edema up to the thighs Over the left lower extremity anterior compartment of the shin the incision is well approximated sutures healing well centrally there is 1 area that is open this was packed with wound VAC sponge there is Kerecis in the wound bed.  This is 4 cm in diameter 2 cm deep there is no exposed bone.  Moderate bloody drainage.  Visit Diagnoses: No diagnosis found.  Plan: Will pack open with gauze compression garment applied to the lower extremity with Unna and Dynaflex.  He will follow-up on Monday.  Follow-Up Instructions: Return in about 1 week (around 04/27/2022).   Imaging: No results found.  Orders:  No orders of the defined types were placed in this encounter.  No orders of the defined types were placed in this encounter.    PMFS History: Patient Active Problem List   Diagnosis Date Noted   Nonrheumatic mitral (valve) insufficiency    Aortic valve stenosis, nonrheumatic    Hypotension    Transient hypotension 04/06/2022   Bradycardia 04/06/2022   (HFpEF) heart failure with preserved ejection fraction ([place]) 04/06/2022   Abscess of leg, left 04/04/2022   Cellulitis of left lower extremity 03/02/2022   Chronic diastolic CHF (congestive heart failure) ([place]) 03/02/2022   Cellulitis of left leg 03/02/2022   Foot callus 12/15/2021   Other secondary pulmonary hypertension ([place]) 08/30/2021   COPD (chronic obstructive pulmonary disease) ([place])  08/30/2021   Coronary artery disease due to type 2 diabetes mellitus ([place]) 07/21/2020   CHF (congestive heart failure) ([place]) 07/21/2020   History of ST elevation myocardial infarction (STEMI) 04/30/2020   Atrial fibrillation ([place]) 02/29/2020   Acute kidney injury superimposed on chronic kidney disease ([place]) 02/28/2020   Hyponatremia 02/28/2020   Lumbar radiculopathy 03/24/2019   Morbid obesity ([place]) 09/04/2018   Lung nodules 01/17/2017   Ascending aortic aneurysm 01/17/2017   Type 2 diabetes mellitus ([place]) 03/20/2016   Tear of lateral meniscus of right knee 11/19/2014   Hypertension    Hyperlipidemia    Metabolic syndrome    Osteoarthritis of both knees 07/06/2013   Acquired spondylolisthesis 03/06/2013   Degeneration of lumbar intervertebral disc 03/06/2013   Lumbar spondylosis 03/06/2013   Spinal stenosis of lumbar region 03/06/2013   Past Medical History:  Diagnosis Date   A-fib [facility])    Acute on chronic diastolic CHF (congestive heart failure) ([place]) 07/21/2020   Coronary artery disease    Diabetes mellitus without complication (HCC)    Dyspnea    Dysrhythmia    A-fib   Hyperlipidemia    Hypertension    Metabolic syndrome    Prediabetes    Sleep apnea    STEMI (ST elevation myocardial infarction) ([place])     Family History  Problem Relation Age of Onset   Cancer Mother        lungs  Coronary artery disease Mother    Diabetes Brother    Heart attack Brother    Diabetes Maternal Grandmother    Arthritis Father     Past Surgical History:  Procedure Laterality Date   APPENDECTOMY     CHOLECYSTECTOMY N/A 03/02/2020   Procedure: LAPAROSCOPIC CHOLECYSTECTOMY;  Surgeon: Aviva Signs, MD;  Location: [facility];  Service: General;  Laterality: N/A;   CORONARY STENT INTERVENTION N/A 04/30/2020   Procedure: CORONARY STENT INTERVENTION;  Surgeon: Martinique, Peter M, MD;  Location: [place] [facility];  Service: Cardiovascular;  Laterality: N/A;   CORONARY/GRAFT ACUTE MI  REVASCULARIZATION N/A 04/30/2020   Procedure: Coronary/Graft Acute MI Revascularization;  Surgeon: Martinique, Peter M, MD;  Location: [place] [facility];  Service: Cardiovascular;  Laterality: N/A;   Eyelid Surgery Bilateral    HERNIA REPAIR     I & D EXTREMITY Left 04/04/2022   Procedure: LEFT LEG DEBRIDEMENT;  Surgeon: Newt Minion, MD;  Location: [place];  Service: Orthopedics;  Laterality: Left;   I & D EXTREMITY Left 04/06/2022   Procedure: LEFT LEG DEBRIDEMENT;  Surgeon: Newt Minion, MD;  Location: [place];  Service: Orthopedics;  Laterality: Left;   knee tendons repair     LEFT HEART CATH AND CORONARY ANGIOGRAPHY N/A 04/30/2020   Procedure: LEFT HEART CATH AND CORONARY ANGIOGRAPHY;  Surgeon: Martinique, Peter M, MD;  Location: [place] [facility];  Service: Cardiovascular;  Laterality: N/A;   ROTATOR CUFF REPAIR Bilateral    TONSILLECTOMY     Social History   Occupational History   Occupation: Theme park manager    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

## 2022-04-23 ENCOUNTER — Encounter: Payer: Self-pay | Admitting: *Deleted

## 2022-04-23 ENCOUNTER — Ambulatory Visit (INDEPENDENT_AMBULATORY_CARE_PROVIDER_SITE_OTHER): Payer: Medicare Other | Admitting: Orthopedic Surgery

## 2022-04-23 ENCOUNTER — Encounter: Payer: Self-pay | Admitting: Orthopedic Surgery

## 2022-04-23 ENCOUNTER — Telehealth: Payer: Self-pay | Admitting: *Deleted

## 2022-04-23 VITALS — Ht 68.0 in | Wt 277.0 lb

## 2022-04-23 DIAGNOSIS — L02416 Cutaneous abscess of left lower limb: Secondary | ICD-10-CM

## 2022-04-23 DIAGNOSIS — I872 Venous insufficiency (chronic) (peripheral): Secondary | ICD-10-CM | POA: Diagnosis not present

## 2022-04-23 NOTE — Progress Notes (Signed)
Office Visit Note   Patient: Ryan Garner           Date of Birth: Apr 30, 1946           MRN: [id number] Visit Date: 04/23/2022              Requested by: [name], [name], MD [place],  [place] [id number] PCP: [name], [name], MD  Chief Complaint  Patient presents with   Left Leg - Routine Post Op, Follow-up    04/06/2022 LLE debridement kerecis graft          HPI: Patient is a 76 year old gentleman who presents 2 weeks status post debridement abscess with Serratia left lower extremity.  Patient has had a Profore wrap on the left lower extremity.  Patient complains of increasing swelling of the right lower extremity.  Patient is currently on Plavix and Eliquis.  He is on Levaquin and doxycycline to cover the infection.  Assessment & Plan: Visit Diagnoses:  1. Abscess of left lower leg   2. Venous stasis dermatitis of both lower extremities     Plan: With the massive swelling of the right lower extremity will apply a 3 layer compression wrap to the right lower extremity.  Discussed the importance of elevation of both lower extremities.  Patient will start Dial soap cleansing to the left lower extremity daily packing open with gauze and using an Ace for compression.  Follow-Up Instructions: Return in about 1 week (around 04/30/2022).   Ortho Exam  Patient is alert, oriented, no adenopathy, well-dressed, normal affect, normal respiratory effort. Examination there is good wrinkling of the skin of the left lower extremity his left calf is 44 cm in circumference.  The proximal and distal aspect of the incision shows good healthy healing.  The open area of the mid aspect of the wound has healthy granulation tissue at the base.  There is no cellulitis no signs of infection.  Patient has massive lymphatic and venous swelling of the right lower extremity which is 56 cm in circumference of the right calf.  Imaging: No results found.    Labs: Lab Results  Component  Value Date   HGBA1C 7.0 (H) 12/15/2021   HGBA1C 6.4 (H) 04/28/2021   HGBA1C 6.7 10/27/2020   REPTSTATUS 04/11/2022 FINAL 04/04/2022   GRAMSTAIN NO WBC SEEN NO ORGANISMS SEEN  04/04/2022   CULT  04/04/2022    RARE SERRATIA MARCESCENS NO ANAEROBES ISOLATED Performed at [facility], [place] [address], [place], [place] [address]    LABORGA SERRATIA MARCESCENS 04/04/2022     Lab Results  Component Value Date   ALBUMIN 2.7 (L) 04/14/2022   ALBUMIN 2.6 (L) 04/13/2022   ALBUMIN 2.6 (L) 04/12/2022    Lab Results  Component Value Date   MG 1.6 (L) 04/14/2022   MG 1.5 (L) 04/13/2022   MG 1.7 04/11/2022   Lab Results  Component Value Date   VD25OH 36.7 04/14/2020    No results found for: "PREALBUMIN"    Latest Ref Rng & Units 04/14/2022    5:55 AM 04/13/2022    3:34 AM 04/12/2022    8:18 AM  CBC EXTENDED  WBC 4.0 - 10.5 K/uL 10.4  10.4  11.6   RBC 4.22 - 5.81 MIL/uL 3.44  3.44  3.33   Hemoglobin 13.0 - 17.0 g/dL 10.9  10.8  10.4   HCT 39.0 - 52.0 % 31.3  31.5  30.6   Platelets 150 - 400 K/uL  258  230  217   NEUT# 1.7 - 7.7 K/uL 7.1  7.3    Lymph# 0.7 - 4.0 K/uL 1.4  1.3       Body mass index is 42.12 kg/m.  Orders:  No orders of the defined types were placed in this encounter.  No orders of the defined types were placed in this encounter.    Procedures: No procedures performed  Clinical Data: No additional findings.  ROS:  All other systems negative, except as noted in the HPI. Review of Systems  Objective: Vital Signs: Ht '5\' 8"'$  (1.727 m)   Wt 277 lb (125.6 kg)   BMI 42.12 kg/m   Specialty Comments:  No specialty comments available.  PMFS History: Patient Active Problem List   Diagnosis Date Noted   Nonrheumatic mitral (valve) insufficiency    Aortic valve stenosis, nonrheumatic    Hypotension    Transient hypotension 04/06/2022   Bradycardia 04/06/2022   (HFpEF) heart failure with preserved ejection fraction ([place]) 04/06/2022   Abscess of  leg, left 04/04/2022   Cellulitis of left lower extremity 03/02/2022   Chronic diastolic CHF (congestive heart failure) ([place]) 03/02/2022   Cellulitis of left leg 03/02/2022   Foot callus 12/15/2021   Other secondary pulmonary hypertension ([place]) 08/30/2021   COPD (chronic obstructive pulmonary disease) ([place]) 08/30/2021   Coronary artery disease due to type 2 diabetes mellitus ([place]) 07/21/2020   CHF (congestive heart failure) ([place]) 07/21/2020   History of ST elevation myocardial infarction (STEMI) 04/30/2020   Atrial fibrillation ([place]) 02/29/2020   Acute kidney injury superimposed on chronic kidney disease ([place]) 02/28/2020   Hyponatremia 02/28/2020   Lumbar radiculopathy 03/24/2019   Morbid obesity ([place]) 09/04/2018   Lung nodules 01/17/2017   Ascending aortic aneurysm 01/17/2017   Type 2 diabetes mellitus ([place]) 03/20/2016   Tear of lateral meniscus of right knee 11/19/2014   Hypertension    Hyperlipidemia    Metabolic syndrome    Osteoarthritis of both knees 07/06/2013   Acquired spondylolisthesis 03/06/2013   Degeneration of lumbar intervertebral disc 03/06/2013   Lumbar spondylosis 03/06/2013   Spinal stenosis of lumbar region 03/06/2013   Past Medical History:  Diagnosis Date   A-fib [facility])    Acute on chronic diastolic CHF (congestive heart failure) ([place]) 07/21/2020   Coronary artery disease    Diabetes mellitus without complication (HCC)    Dyspnea    Dysrhythmia    A-fib   Hyperlipidemia    Hypertension    Metabolic syndrome    Prediabetes    Sleep apnea    STEMI (ST elevation myocardial infarction) ([place])     Family History  Problem Relation Age of Onset   Cancer Mother        lungs   Coronary artery disease Mother    Diabetes Brother    Heart attack Brother    Diabetes Maternal Grandmother    Arthritis Father     Past Surgical History:  Procedure Laterality Date   APPENDECTOMY     CHOLECYSTECTOMY N/A 03/02/2020   Procedure: LAPAROSCOPIC CHOLECYSTECTOMY;   Surgeon: Aviva Signs, MD;  Location: [facility];  Service: General;  Laterality: N/A;   CORONARY STENT INTERVENTION N/A 04/30/2020   Procedure: CORONARY STENT INTERVENTION;  Surgeon: Martinique, Peter M, MD;  Location: [place] [facility];  Service: Cardiovascular;  Laterality: N/A;   CORONARY/GRAFT ACUTE MI REVASCULARIZATION N/A 04/30/2020   Procedure: Coronary/Graft Acute MI Revascularization;  Surgeon: Martinique, Peter M, MD;  Location: [place] [facility];  Service: Cardiovascular;  Laterality: N/A;   Eyelid Surgery Bilateral    HERNIA REPAIR     I & D EXTREMITY Left 04/04/2022   Procedure: LEFT LEG DEBRIDEMENT;  Surgeon: Newt Minion, MD;  Location: [place];  Service: Orthopedics;  Laterality: Left;   I & D EXTREMITY Left 04/06/2022   Procedure: LEFT LEG DEBRIDEMENT;  Surgeon: Newt Minion, MD;  Location: [place];  Service: Orthopedics;  Laterality: Left;   knee tendons repair     LEFT HEART CATH AND CORONARY ANGIOGRAPHY N/A 04/30/2020   Procedure: LEFT HEART CATH AND CORONARY ANGIOGRAPHY;  Surgeon: Martinique, Peter M, MD;  Location: [place] [facility];  Service: Cardiovascular;  Laterality: N/A;   ROTATOR CUFF REPAIR Bilateral    TONSILLECTOMY     Social History   Occupational History   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

## 2022-04-23 NOTE — Patient Outreach (Addendum)
  Care Coordination [facility] Note Transition Care Management Follow-up Telephone Call Date of discharge and from where: 04/20/22 from [facility] How have you been since you were released from the hospital? "I'm doing fair"   Any questions or concerns? No  Items Reviewed: Did the pt receive and understand the discharge instructions provided? Yes  Medications obtained and verified? Yes  Other? No  Any new allergies since your discharge? No  Dietary orders reviewed? Yes Do you have support at home? Yes   Home Care and Equipment/Supplies: Were home health services ordered? yes If so, what is the name of the agency? [facility]  Has the agency set up a time to come to the patient's home? No. Receiving services prior to admission and is going to call them to resume services. He has their number. Were any new equipment or medical supplies ordered?  No What is the name of the medical supply agency? N/a Were you able to get the supplies/equipment? not applicable Do you have any questions related to the use of the equipment or supplies? No  Functional Questionnaire: (I = Independent and D = Dependent) ADLs: I  Bathing/Dressing- I  Meal Prep- I  Eating- I  Maintaining continence- I  Transferring/Ambulation- I  Managing Meds- I  Follow up appointments reviewed:  PCP Hospital f/u appt confirmed? Yes  Dr [name] on 05/03/22 at 10:55 [facility] f/u appt confirmed? Yes  Scheduled to see Dr Sharol Given (ortho) this morning on 04/23/22, Dr Candiss Norse (infectious disease) 04/27/22 at 10:30, Bhagat, PA-C (cardio) 04/26/22 at 3:10 Are transportation arrangements needed? No  If their condition worsens, is the pt aware to call PCP or go to the Emergency Dept.? Yes Was the patient provided with contact information for the PCP's office or ED? Yes Was to pt encouraged to call back with questions or concerns? Yes  SDOH assessments and interventions completed:   Yes  Care Coordination Interventions  Activated:  Yes   Care Coordination Interventions:  Referred for Care Coordination Services:  RN Care Coordinator . Scheduled with Valente David, RN Care Coordinator [phone]) on 05/21/22 at 11:30 for an initial telephone call Encounter Outcome:  Pt. Visit Completed    Chong Sicilian, BSN, RN-BC RN Care Coordinator [place]: [phone] Main #: [phone]

## 2022-04-24 ENCOUNTER — Telehealth: Payer: Self-pay | Admitting: Family Medicine

## 2022-04-24 ENCOUNTER — Telehealth: Payer: Self-pay | Admitting: Orthopedic Surgery

## 2022-04-24 NOTE — Telephone Encounter (Signed)
Patient called advised the wrap was coming off of his leg. Matilde Sprang)     Patient asked if he need to come back in to have it rewrapped? The number to contact patient is [phone]

## 2022-04-24 NOTE — Telephone Encounter (Signed)
TC from [place] w/ [facility] Pt is wanting to change [facility] agencies, she says [facility] has not been out to see him yet, she had talked w/ pt yesterday. Needing discharge orders to [facility]. LMOVM to [facility]

## 2022-04-24 NOTE — Telephone Encounter (Signed)
Called pt and he declined appt today due to lack of transportation. Appt shc for tomorrow with Junie Panning at 10:45

## 2022-04-24 NOTE — Telephone Encounter (Signed)
LMOVM VO to resume nursing

## 2022-04-25 ENCOUNTER — Encounter: Payer: Self-pay | Admitting: Family

## 2022-04-25 ENCOUNTER — Ambulatory Visit (INDEPENDENT_AMBULATORY_CARE_PROVIDER_SITE_OTHER): Payer: Medicare Other | Admitting: Family

## 2022-04-25 DIAGNOSIS — I872 Venous insufficiency (chronic) (peripheral): Secondary | ICD-10-CM | POA: Diagnosis not present

## 2022-04-25 DIAGNOSIS — I5043 Acute on chronic combined systolic (congestive) and diastolic (congestive) heart failure: Secondary | ICD-10-CM | POA: Diagnosis not present

## 2022-04-25 DIAGNOSIS — L02416 Cutaneous abscess of left lower limb: Secondary | ICD-10-CM | POA: Diagnosis not present

## 2022-04-25 DIAGNOSIS — Z9889 Other specified postprocedural states: Secondary | ICD-10-CM

## 2022-04-25 DIAGNOSIS — N179 Acute kidney failure, unspecified: Secondary | ICD-10-CM | POA: Diagnosis not present

## 2022-04-25 DIAGNOSIS — L03116 Cellulitis of left lower limb: Secondary | ICD-10-CM | POA: Diagnosis not present

## 2022-04-25 DIAGNOSIS — N189 Chronic kidney disease, unspecified: Secondary | ICD-10-CM | POA: Diagnosis not present

## 2022-04-25 DIAGNOSIS — I130 Hypertensive heart and chronic kidney disease with heart failure and stage 1 through stage 4 chronic kidney disease, or unspecified chronic kidney disease: Secondary | ICD-10-CM | POA: Diagnosis not present

## 2022-04-25 DIAGNOSIS — I890 Lymphedema, not elsewhere classified: Secondary | ICD-10-CM | POA: Diagnosis not present

## 2022-04-25 DIAGNOSIS — E1122 Type 2 diabetes mellitus with diabetic chronic kidney disease: Secondary | ICD-10-CM | POA: Diagnosis not present

## 2022-04-25 NOTE — Progress Notes (Signed)
Post-Op Visit Note   Patient: Ryan Garner           Date of Birth: [birth year]           MRN: [id number] Visit Date: 04/25/2022 PCP: [name], [name], MD  Chief Complaint:  Chief Complaint  Patient presents with   Left Leg - Routine Post Op    04/06/2022 LLE debridement kerecis graft     HPI:  HPI The patient is a 76 year old gentleman who is seen status post left lower extremity debridement with Kerecis placement on September 15.  He was last seen in the office a week ago a Profore dressing was applied unfortunately this has rolled down this was 2 days ago. Today he presents in routine postoperative follow-up also for [facility] graft placement in office. Ortho Exam  The wound healing has stalled, the wound bed has healthy granulation tissue, and patient presents for evaluation and application of Kerecis MariGen Micro graft. After informed consent a 10 blade knife was used to debride the skin and soft tissue to healthy viable bleeding granulation tissue.  Silver nitrate was used for hemostasis. The wound measures: 5 cm in length, 5cm  in width, 20 mm in depth, wound location left lower extremity Kerecis MariGen micro tissue graft 25 cm2 was applied, and there was no wastage.  Please see the photo below of the Lot number and expiration date. The micro tissue graft was covered with a nonadherent Adaptic dressing, bolstered with 4 x 4 gauze and secured with a compression wrap.  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  There is weeping serous drainage.  Measurements of his lower extremities are as follows on the right knee 61 cm calf 54 cm ankle 35 cm left knee 61 cm calf 49 cm ankle 35 cm   Visit Diagnoses: No diagnosis found.  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  We will begin applying for lymphedema pumps on his behalf.    Follow-Up Instructions: No  follow-ups on file.   Imaging: No results found.  Orders:  No orders of the defined types were placed in this encounter.  No orders of the defined types were placed in this encounter.    PMFS History: Patient Active Problem List   Diagnosis Date Noted   Nonrheumatic mitral (valve) insufficiency    Aortic valve stenosis, nonrheumatic    Hypotension    Transient hypotension 04/06/2022   Bradycardia 04/06/2022   (HFpEF) heart failure with preserved ejection fraction ([place]) 04/06/2022   Abscess of leg, left 04/04/2022   Cellulitis of left lower extremity 03/02/2022   Chronic diastolic CHF (congestive heart failure) ([place]) 03/02/2022   Cellulitis of left leg 03/02/2022   Foot callus 12/15/2021   Other secondary pulmonary hypertension ([place]) 08/30/2021   COPD (chronic obstructive pulmonary disease) ([place]) 08/30/2021   Coronary artery disease due to type 2 diabetes mellitus ([place]) 07/21/2020   CHF (congestive heart failure) ([place]) 07/21/2020   History of ST elevation myocardial infarction (STEMI) 04/30/2020   Atrial fibrillation ([place]) 02/29/2020   Acute kidney injury superimposed on chronic kidney disease ([place]) 02/28/2020   Hyponatremia 02/28/2020   Lumbar radiculopathy 03/24/2019   Morbid obesity ([place]) 09/04/2018   Lung nodules 01/17/2017   Ascending aortic aneurysm 01/17/2017   Type 2 diabetes mellitus ([place]) 03/20/2016   Tear of lateral meniscus of right knee 11/19/2014   Hypertension    Hyperlipidemia  Metabolic syndrome    Osteoarthritis of both knees 07/06/2013   Acquired spondylolisthesis 03/06/2013   Degeneration of lumbar intervertebral disc 03/06/2013   Lumbar spondylosis 03/06/2013   Spinal stenosis of lumbar region 03/06/2013   Past Medical History:  Diagnosis Date   A-fib [facility])    Acute on chronic diastolic CHF (congestive heart failure) ([place]) 07/21/2020   Coronary artery disease    Diabetes mellitus without complication (HCC)    Dyspnea    Dysrhythmia     A-fib   Hyperlipidemia    Hypertension    Metabolic syndrome    Prediabetes    Sleep apnea    STEMI (ST elevation myocardial infarction) ([place])     Family History  Problem Relation Age of Onset   Cancer Mother        lungs   Coronary artery disease Mother    Diabetes Brother    Heart attack Brother    Diabetes Maternal Grandmother    Arthritis Father     Past Surgical History:  Procedure Laterality Date   APPENDECTOMY     CHOLECYSTECTOMY N/A 03/02/2020   Procedure: LAPAROSCOPIC CHOLECYSTECTOMY;  Surgeon: Aviva Signs, MD;  Location[facility];  Service: General;  Laterality: N/A;   CORONARY STENT INTERVENTION N/A 04/30/2020   Procedure: CORONARY STENT INTERVENTION;  Surgeon: Martinique, Peter M, MD;  Location: [place] [facility];  Service: Cardiovascular;  Laterality: N/A;   CORONARY/GRAFT ACUTE MI REVASCULARIZATION N/A 04/30/2020   Procedure: Coronary/Graft Acute MI Revascularization;  Surgeon: Martinique, Peter M, MD;  Location: [place] [facility];  Service: Cardiovascular;  Laterality: N/A;   Eyelid Surgery Bilateral    HERNIA REPAIR     I & D EXTREMITY Left 04/04/2022   Procedure: LEFT LEG DEBRIDEMENT;  Surgeon: Newt Minion, MD;  Location: [place];  Service: Orthopedics;  Laterality: Left;   I & D EXTREMITY Left 04/06/2022   Procedure: LEFT LEG DEBRIDEMENT;  Surgeon: Newt Minion, MD;  Location: [place];  Service: Orthopedics;  Laterality: Left;   knee tendons repair     LEFT HEART CATH AND CORONARY ANGIOGRAPHY N/A 04/30/2020   Procedure: LEFT HEART CATH AND CORONARY ANGIOGRAPHY;  Surgeon: Martinique, Peter M, MD;  Location: [place] [facility];  Service: Cardiovascular;  Laterality: N/A;   ROTATOR CUFF REPAIR Bilateral    TONSILLECTOMY     Social History   Occupational History   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

## 2022-04-26 ENCOUNTER — Encounter: Payer: Self-pay | Admitting: Physician Assistant

## 2022-04-26 ENCOUNTER — Ambulatory Visit: Payer: Medicare Other | Attending: Physician Assistant | Admitting: Physician Assistant

## 2022-04-26 VITALS — BP 84/62 | HR 74 | Ht 68.0 in | Wt 283.0 lb

## 2022-04-26 DIAGNOSIS — I5032 Chronic diastolic (congestive) heart failure: Secondary | ICD-10-CM | POA: Diagnosis not present

## 2022-04-26 DIAGNOSIS — I482 Chronic atrial fibrillation, unspecified: Secondary | ICD-10-CM | POA: Diagnosis not present

## 2022-04-26 DIAGNOSIS — I2510 Atherosclerotic heart disease of native coronary artery without angina pectoris: Secondary | ICD-10-CM | POA: Diagnosis not present

## 2022-04-26 DIAGNOSIS — I340 Nonrheumatic mitral (valve) insufficiency: Secondary | ICD-10-CM | POA: Diagnosis not present

## 2022-04-26 MED ORDER — NITROGLYCERIN 0.4 MG SL SUBL: 0.4000 mg | SUBLINGUAL_TABLET | SUBLINGUAL | 2 refills | Status: DC | PRN

## 2022-04-26 NOTE — Progress Notes (Signed)
Cardiology Office Note:    Date:  04/26/2022   ID:  Ryan Garner, DOB Nov 21, 1945, MRN [id number]  PCP:  [name], [name], MD  [facility] Cardiologist:  Dorris Carnes, MD  [facility] Electrophysiologist:  None   Chief Complaint:   History of Present Illness:    Ryan Garner is a 76 y.o. male with a hx of  atrial fibrillation on chronic anticoagulation with Eliquis, type 2 diabetes, hypertension, hyperlipidemia, sleep apnea, CKD III,  history of CAD from STEMI in 2021 s/p DES to the left circumflex artery seen for follow up.   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 Immediately after intubation, patient became bradycardic and hypotension but no mention of loss of pulses. He underwent resuscitation and responded very well. Decision was made to proceed with procedure and Cardiology was consulted afterwards for further evaluation. No recurrent events. ? Due to anesthesia related. Tolerated Lopressor '25mg'$  twice daily well. Echo showed LVEF of 50-55% with no regional wall motion abnormalities, moderately enlarged RV with moderately reduced systolic function, severe biatrial enlargement, severe MR, moderate AS, and severe pulmonary hypertension with RVSP of 85.2 mmHg. Diuresed 26.8L.   Repeat limited Echo on 9/21 showed severe MR related to restricted PMVL with posterior directed jet unchanged from prior exam. Recommended outpatient TEE and right and left heart catheterization for severe MR once leg abscess is healed.   Per ortho note 04/25/22  did skin graft. 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  We will begin applying for lymphedema pumps on his behalf".   Has follow-up with Ortho next Monday.  He denies chest pain, shortness of breath, orthopnea, PND, palpitation or melena.  Has Ace wrap bilaterally after skin graft placement yesterday.  Takes  his medication.  Intermittent dizziness upon standing.  He gets his balance before movement.  Past Medical History:  Diagnosis Date   A-fib [facility])    Acute on chronic diastolic CHF (congestive heart failure) ([place]) 07/21/2020   Coronary artery disease    Diabetes mellitus without complication (HCC)    Dyspnea    Dysrhythmia    A-fib   Hyperlipidemia    Hypertension    Metabolic syndrome    Prediabetes    Sleep apnea    STEMI (ST elevation myocardial infarction) [facility])     Past Surgical History:  Procedure Laterality Date   APPENDECTOMY     CHOLECYSTECTOMY N/A 03/02/2020   Procedure: LAPAROSCOPIC CHOLECYSTECTOMY;  Surgeon: Aviva Signs, MD;  Location: [facility];  Service: General;  Laterality: N/A;   CORONARY STENT INTERVENTION N/A 04/30/2020   Procedure: CORONARY STENT INTERVENTION;  Surgeon: Martinique, Peter M, MD;  Location: [place] [facility];  Service: Cardiovascular;  Laterality: N/A;   CORONARY/GRAFT ACUTE MI REVASCULARIZATION N/A 04/30/2020   Procedure: Coronary/Graft Acute MI Revascularization;  Surgeon: Martinique, Peter M, MD;  Location: [place] [facility];  Service: Cardiovascular;  Laterality: N/A;   Eyelid Surgery Bilateral    HERNIA REPAIR     I & D EXTREMITY Left 04/04/2022   Procedure: LEFT LEG DEBRIDEMENT;  Surgeon: Newt Minion, MD;  Location: [place];  Service: Orthopedics;  Laterality: Left;   I & D EXTREMITY Left 04/06/2022   Procedure: LEFT LEG DEBRIDEMENT;  Surgeon: Newt Minion, MD;  Location: [place];  Service: Orthopedics;  Laterality: Left;   knee tendons repair  LEFT HEART CATH AND CORONARY ANGIOGRAPHY N/A 04/30/2020   Procedure: LEFT HEART CATH AND CORONARY ANGIOGRAPHY;  Surgeon: Martinique, Peter M, MD;  Location: [place] [facility];  Service: Cardiovascular;  Laterality: N/A;   ROTATOR CUFF REPAIR Bilateral    TONSILLECTOMY      Current Medications: Current Meds  Medication Sig   apixaban (ELIQUIS) 5 MG TABS tablet Take 1 tablet (5 mg total) by mouth 2 (two)  times daily.   ascorbic acid (VITAMIN C) 1000 MG tablet Take 1 tablet (1,000 mg total) by mouth daily.   atorvastatin (LIPITOR) 80 MG tablet Take 1 tablet by mouth once daily   clopidogrel (PLAVIX) 75 MG tablet Take 1 tablet (75 mg total) by mouth daily with breakfast.   cyanocobalamin (VITAMIN B12) 1000 MCG tablet Take 1,000 mcg by mouth daily.   doxycycline (VIBRA-TABS) 100 MG tablet Take 1 tablet (100 mg total) by mouth every 12 (twelve) hours.   furosemide (LASIX) 80 MG tablet Take 1 tablet (80 mg total) by mouth 2 (two) times daily.   metoprolol tartrate (LOPRESSOR) 25 MG tablet Take 1 tablet (25 mg total) by mouth 2 (two) times daily.   Multiple Vitamin (MULTIVITAMIN WITH MINERALS) TABS tablet Take 1 tablet by mouth daily.   nutrition supplement, JUVEN, (JUVEN) PACK Take 1 packet by mouth 2 (two) times daily between meals.   potassium chloride SA (KLOR-CON M) 20 MEQ tablet Take 2 tablets (40 mEq total) by mouth daily.   Tiotropium Bromide Monohydrate (SPIRIVA RESPIMAT) 1.25 MCG/ACT AERS Inhale 2 puffs into the lungs daily.   zinc sulfate 220 (50 Zn) MG capsule Take 1 capsule (220 mg total) by mouth daily.   [DISCONTINUED] levofloxacin (LEVAQUIN) 750 MG tablet Take 1 tablet (750 mg total) by mouth at bedtime.   [DISCONTINUED] methocarbamol (ROBAXIN) 500 MG tablet Take 1 tablet (500 mg total) by mouth every 6 (six) hours as needed for muscle spasms.   [DISCONTINUED] Misc Natural Products (PROSTATE SUPPORT) 300-15 MG TABS Take 1 tablet by mouth daily. Super Beta Prostate   [DISCONTINUED] nitroGLYCERIN (NITROSTAT) 0.4 MG SL tablet Place 1 tablet (0.4 mg total) under the tongue every 5 (five) minutes x 3 doses as needed for chest pain.   [DISCONTINUED] oxyCODONE 10 MG TABS Take 1-1.5 tablets (10-15 mg total) by mouth every 4 (four) hours as needed for severe pain (pain score 7-10).     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  Other Topics Concern   Not on file  Social History Narrative   Not on file   Social Determinants of Health   Financial Resource Strain: Low Risk  (04/23/2022)   Overall Financial Resource Strain (CARDIA)    Difficulty of Paying Living Expenses: Not hard at all  Food Insecurity: No Food Insecurity (04/05/2022)  Hunger Vital Sign    Worried About Running Out of Food in the Last Year: Never true    Ran Out of Food in the Last Year: Never true  Transportation Needs: No Transportation Needs (04/23/2022)   PRAPARE - Hydrologist (Medical): No    Lack of Transportation (Non-Medical): No  Physical Activity: Inactive (09/25/2021)   Exercise Vital Sign    Days of Exercise per Week: 0 days    Minutes of Exercise per Session: 0 min  Stress: Stress Concern Present (09/25/2021)   [place]    Feeling of Stress : To some extent  Social Connections: Socially Integrated (07/03/2021)   Social Connection and Isolation Panel [NHANES]    Frequency of Communication with Friends and Family: Twice a week    Frequency of Social Gatherings with Friends and Family: Twice a week    Attends Religious Services: More than 4 times per year    Active Member of [employer] or Organizations: Yes    Attends Music therapist: More than 4 times per year    Marital Status: Married     Family History: The  patient's family history includes Arthritis in his father; Cancer in his mother; Coronary artery disease in his mother; Diabetes in his brother and maternal grandmother; Heart attack in his brother.    ROS:   Please see the history of present illness.    All other systems reviewed and are negative.   EKGs/Labs/Other Studies Reviewed:    The following studies were reviewed today:  Limited echo 04/12/22 1. Very limited study to evaluated mitral regurgitation. Severe mitral  valve regurgitation appears to be related to restricted PMVL. Jet is  posteriorly directed. Unchanged from prior. The mitral valve is abnormal.  Severe mitral valve regurgitation. No  evidence of mitral stenosis.   2. Left ventricular ejection fraction, by estimation, is 50 to 55%. The  left ventricle has low normal function. The left ventricle demonstrates  global hypokinesis. There is the interventricular septum is flattened in  systole and diastole, consistent  with right ventricular pressure and volume overload.   3. Right ventricular systolic function was not well visualized. The right  ventricular size is not well visualized.   Comparison(s): No significant change from prior study.   Echo 04/07/22 1. Left ventricular ejection fraction, by estimation, is 50 to 55%. The  left ventricle has mildly decreased function. The left ventricle has no  regional wall motion abnormalities. There is mild concentric left  ventricular hypertrophy. Left ventricular  diastolic function could not be evaluated. There is the interventricular  septum is flattened in systole and diastole, consistent with right  ventricular pressure and volume overload.   2. Right ventricular systolic function is moderately reduced. The right  ventricular size is moderately enlarged. There is severely elevated  pulmonary artery systolic pressure. The estimated right ventricular  systolic pressure is 02.6 mmHg.   3. Left atrial size was severely  dilated.   4. Right atrial size was severely dilated.   5. The mitral valve is grossly normal. Severe mitral valve regurgitation.  Moderate mitral annular calcification.   6. The aortic valve is tricuspid. There is severe calcifcation of the  aortic valve. There is severe thickening of the aortic valve. Aortic valve  regurgitation is not visualized. Moderate aortic valve stenosis. Aortic  valve mean gradient measures 17.0  mmHg. Aortic valve Vmax measures 2.71 m/s. Aortic valve acceleration time  measures 120 msec.   7. Aortic dilatation noted. There is mild dilatation of the ascending  aorta, measuring 41 mm. There is mild dilatation of the aortic root,  measuring 39 mm.   8. The inferior vena cava is dilated in size with <50% respiratory  variability, suggesting right atrial pressure of 15 mmHg.   Comparison(s): Prior images reviewed side by side. The left ventricular  function is worsened. Mitral insufficiency is worse.   EKG:  EKG is not ordered today.  Recent Labs: 04/06/2022: B Natriuretic Peptide 682.5 04/14/2022: ALT 13; BUN 33; Creatinine, Ser 1.13; Hemoglobin 10.9; Magnesium 1.6; Platelets 258; Potassium 3.9; Sodium 133  Recent Lipid Panel    Component Value Date/Time   CHOL 92 (L) 12/15/2021 1259   TRIG 60 12/15/2021 1259   TRIG 74 08/27/2014 0826   HDL 35 (L) 12/15/2021 1259   HDL 56 08/27/2014 0826   CHOLHDL 2.6 12/15/2021 1259   CHOLHDL 4.1 05/01/2020 1759   VLDL 13 05/01/2020 1759   LDLCALC 43 12/15/2021 1259   LDLCALC 78 11/13/2013 0911     Physical Exam:    VS:  BP (!) 84/62   Pulse 74   Ht '5\' 8"'$  (1.727 m)   Wt 283 lb (128.4 kg)   SpO2 99%   BMI 43.03 kg/m     Wt Readings from Last 3 Encounters:  04/26/22 283 lb (128.4 kg)  04/23/22 277 lb (125.6 kg)  04/14/22 277 lb 12.5 oz (126 kg)     GEN: Obese male in no acute distress HEENT: Normal NECK: No JVD; No carotid bruits LYMPHATICS: No lymphadenopathy CARDIAC: Irregularly irregular, +  murmurs,  rubs, gallops RESPIRATORY:  Clear to auscultation without rales, wheezing or rhonchi  ABDOMEN: Soft, non-tender, non-distended MUSCULOSKELETAL: Lower extremity edema with Ace wrap.  no deformity  SKIN: Warm and dry NEUROLOGIC:  Alert and oriented x 3 PSYCHIATRIC:  Normal affect   ASSESSMENT AND PLAN:    Episode of Bradycardia and Hypotension Following intubation and induction of anesthesia. Received transient resuscitative efforts in the OR and was able to complete operation. No recurrent events and tolerated Lopressor '25mg'$  twice   Chronic Diastolic CHF Echo showed LVEF of 50-55% with no regional wall motion abnormalities, moderately enlarged RV with moderately reduced systolic function, severe biatrial enlargement, severe MR, moderate AS, and severe pulmonary hypertension with RVSP of 85.2 mmHg.  - Volume status stable  - Continue PO Lasix '80mg'$  twice daily. - Would consider adding SGLT2 inhibitor at follow up     Severe Mitral Regurgitation Moderate Aortic Stenosis Severe MR was a new finding.Repeat limited Echo on 9/21 showed severe MR related to restricted PMVL with posterior directed jet unchanged from prior exam. - Continue medical therapy for now with BB despite soft BB and lasix.  - Will ultimately need TEE and right/left cardiac catheterization for work-up of mitral valve repair once infection has been treated.   CAD DES to LCX in 2021 - Still has some mild chest soreness from brief CPR  - Continue Plavix '75mg'$  daily. Not on aspirin due to need for DOAC. - Continue beta-blocker and high-intensity statin.   Severe Pulmonary Hypertension Echo  showed RVSP of 85.2 mmHg with moderately reduced RV function. Felt to largely be due to left heart disease and probably also due to obstructive sleep apnea.  - Will ultimately need R/LHC after treatment of infection.   Permanent Atrial Fibrillation - Rate stable  - Continue Lopressor '25mg'$  twice daily. - Continue Eliquis '5mg'$  twice  daily.   Hyperlipidemia - Continue Lipitor '80mg'$  daily.   Type 2 Diabetes Mellitus - Management per primary team.   Leg Abscess - Followed by ortho  Medication Adjustments/Labs and Tests Ordered: Current medicines are reviewed at length with the patient today.  Concerns regarding medicines are outlined above.  No orders of the defined types were placed in this encounter.  Meds ordered this encounter  Medications   nitroGLYCERIN (NITROSTAT) 0.4 MG SL tablet    Sig: Place 1 tablet (0.4 mg total) under the tongue every 5 (five) minutes x 3 doses as needed for chest pain.    Dispense:  25 tablet    Refill:  2    Patient Instructions  Medication Instructions:  Your physician recommends that you continue on your current medications as directed. Please refer to the Current Medication list given to you today. *If you need a refill on your cardiac medications before your next appointment, please call your pharmacy*   Lab Work: None Ordered   Testing/Procedures: None Ordered   Follow-Up: At [facility], you and your health needs are our priority.  As part of our continuing mission to provide you with exceptional heart care, we have created designated Provider Care Teams.  These Care Teams include your primary Cardiologist (physician) and Advanced Practice Providers (APPs -  Physician Assistants and Nurse Practitioners) who all work together to provide you with the care you need, when you need it.  We recommend signing up for the patient portal called "MyChart".  Sign up information is provided on this After Visit Summary.  MyChart is used to connect with patients for Virtual Visits (Telemedicine).  Patients are able to view lab/test results, encounter notes, upcoming appointments, etc.  Non-urgent messages can be sent to your provider as well.   To learn more about what you can do with MyChart, go to [email or web address].    Your next appointment:   6 week(s)  The  format for your next appointment:   In Person  Provider:   Dorris Carnes, MD  or APP while Dr Harrington Challenger is in the office   Other Instructions   Important Information About Sugar         Signed, Leanor Kail, [place]  04/26/2022 3:57 PM    [place]

## 2022-04-26 NOTE — Patient Instructions (Signed)
Medication Instructions:  Your physician recommends that you continue on your current medications as directed. Please refer to the Current Medication list given to you today. *If you need a refill on your cardiac medications before your next appointment, please call your pharmacy*   Lab Work: None Ordered   Testing/Procedures: None Ordered   Follow-Up: At [facility], you and your health needs are our priority.  As part of our continuing mission to provide you with exceptional heart care, we have created designated Provider Care Teams.  These Care Teams include your primary Cardiologist (physician) and Advanced Practice Providers (APPs -  Physician Assistants and Nurse Practitioners) who all work together to provide you with the care you need, when you need it.  We recommend signing up for the patient portal called "MyChart".  Sign up information is provided on this After Visit Summary.  MyChart is used to connect with patients for Virtual Visits (Telemedicine).  Patients are able to view lab/test results, encounter notes, upcoming appointments, etc.  Non-urgent messages can be sent to your provider as well.   To learn more about what you can do with MyChart, go to [email or web address].    Your next appointment:   6 week(s)  The format for your next appointment:   In Person  Provider:   Dorris Carnes, MD  or APP while Dr Harrington Challenger is in the office   Other Instructions   Important Information About Sugar

## 2022-04-27 ENCOUNTER — Inpatient Hospital Stay: Payer: Medicare Other | Admitting: Internal Medicine

## 2022-04-30 ENCOUNTER — Ambulatory Visit (INDEPENDENT_AMBULATORY_CARE_PROVIDER_SITE_OTHER): Payer: Medicare Other | Admitting: Orthopedic Surgery

## 2022-04-30 ENCOUNTER — Encounter: Payer: Self-pay | Admitting: Orthopedic Surgery

## 2022-04-30 DIAGNOSIS — L02416 Cutaneous abscess of left lower limb: Secondary | ICD-10-CM

## 2022-04-30 DIAGNOSIS — I872 Venous insufficiency (chronic) (peripheral): Secondary | ICD-10-CM | POA: Diagnosis not present

## 2022-04-30 NOTE — Progress Notes (Signed)
Office Visit Note   Patient: Ryan Garner           Date of Birth: 05-25-1946           MRN: [id number] Visit Date: 04/30/2022              Requested by: [name], [name], MD [place],  [place] [address] PCP: [name], [name], MD  Chief Complaint  Patient presents with   Left Leg - Routine Post Op    04/06/2022 LLE debridement kerecis graft Approved in office kerecis application 00/7/62 and today 04/30/22      HPI: Patient is a 76 year old gentleman who presents in follow-up for large abscess wound left lower extremity status post surgical debridement on September 15 and office Kerecis tissue graft on October 4.  Patient is undergoing bilateral lower extremity compression wraps secondary to the venous insufficiency.  Assessment & Plan: Visit Diagnoses:  1. Abscess of left lower leg   2. Venous stasis dermatitis of both lower extremities     Plan: Additional tissue graft was applied to the left leg.  Compression wraps to both lower extremities.  Patient is showing slow steady improvement.  Follow-Up Instructions: Return in about 1 week (around 05/07/2022).   Ortho Exam  Patient is alert, oriented, no adenopathy, well-dressed, normal affect, normal respiratory effort. Examination patient now has wrinkling of the skin of both legs he is showing good improvement with the compression wraps.  The wound bed on the left leg has healthy granulation tissue.  The wound healing has stalled, the wound bed has healthy granulation tissue, and patient presents for evaluation and application of [employer] Micro graft. After informed consent a 10 blade knife was used to debride the skin and soft tissue to healthy viable bleeding granulation tissue.  Silver nitrate was used for hemostasis. The wound measures: 7.0 cm in length, 3.0cm  in width, 15 mm in depth, wound location left proximal leg medially Kerecis MariGen micro tissue graft 19 cm2 was applied, and there was no  wastage.  Please see the photo below of the Lot number and expiration date. The micro tissue graft was covered with a nonadherent Adaptic dressing, bolstered with 4 x 4 gauze and secured with a compression wrap.     Imaging: No results found.      Labs: Lab Results  Component Value Date   HGBA1C 7.0 (H) 12/15/2021   HGBA1C 6.4 (H) 04/28/2021   HGBA1C 6.7 10/27/2020   REPTSTATUS 04/11/2022 FINAL 04/04/2022   GRAMSTAIN NO WBC SEEN NO ORGANISMS SEEN  04/04/2022   CULT  04/04/2022    RARE SERRATIA MARCESCENS NO ANAEROBES ISOLATED Performed at [facility], [place] [address], [place],  [address]    LABORGA SERRATIA MARCESCENS 04/04/2022     Lab Results  Component Value Date   ALBUMIN 2.7 (L) 04/14/2022   ALBUMIN 2.6 (L) 04/13/2022   ALBUMIN 2.6 (L) 04/12/2022    Lab Results  Component Value Date   MG 1.6 (L) 04/14/2022   MG 1.5 (L) 04/13/2022   MG 1.7 04/11/2022   Lab Results  Component Value Date   VD25OH 36.7 04/14/2020    No results found for: "PREALBUMIN"    Latest Ref Rng & Units 04/14/2022    5:55 AM 04/13/2022    3:34 AM 04/12/2022    8:18 AM  CBC EXTENDED  WBC 4.0 - 10.5 K/uL 10.4  10.4  11.6   RBC 4.22 - 5.81 MIL/uL  3.44  3.44  3.33   Hemoglobin 13.0 - 17.0 g/dL 10.9  10.8  10.4   HCT 39.0 - 52.0 % 31.3  31.5  30.6   Platelets 150 - 400 K/uL 258  230  217   NEUT# 1.7 - 7.7 K/uL 7.1  7.3    Lymph# 0.7 - 4.0 K/uL 1.4  1.3       There is no height or weight on file to calculate BMI.  Orders:  No orders of the defined types were placed in this encounter.  No orders of the defined types were placed in this encounter.    Procedures: No procedures performed  Clinical Data: No additional findings.  ROS:  All other systems negative, except as noted in the HPI. Review of Systems  Objective: Vital Signs: There were no vitals taken for this visit.  Specialty Comments:  No specialty comments available.  PMFS History: Patient  Active Problem List   Diagnosis Date Noted   Nonrheumatic mitral (valve) insufficiency    Aortic valve stenosis, nonrheumatic    Hypotension    Transient hypotension 04/06/2022   Bradycardia 04/06/2022   (HFpEF) heart failure with preserved ejection fraction ([place]) 04/06/2022   Abscess of leg, left 04/04/2022   Cellulitis of left lower extremity 03/02/2022   Chronic diastolic CHF (congestive heart failure) ([place]) 03/02/2022   Cellulitis of left leg 03/02/2022   Foot callus 12/15/2021   Other secondary pulmonary hypertension ([place]) 08/30/2021   COPD (chronic obstructive pulmonary disease) ([place]) 08/30/2021   Coronary artery disease due to type 2 diabetes mellitus ([place]) 07/21/2020   CHF (congestive heart failure) ([place]) 07/21/2020   History of ST elevation myocardial infarction (STEMI) 04/30/2020   Atrial fibrillation ([place]) 02/29/2020   Acute kidney injury superimposed on chronic kidney disease ([place]) 02/28/2020   Hyponatremia 02/28/2020   Lumbar radiculopathy 03/24/2019   Morbid obesity ([place]) 09/04/2018   Lung nodules 01/17/2017   Ascending aortic aneurysm 01/17/2017   Type 2 diabetes mellitus ([place]) 03/20/2016   Tear of lateral meniscus of right knee 11/19/2014   Hypertension    Hyperlipidemia    Metabolic syndrome    Osteoarthritis of both knees 07/06/2013   Acquired spondylolisthesis 03/06/2013   Degeneration of lumbar intervertebral disc 03/06/2013   Lumbar spondylosis 03/06/2013   Spinal stenosis of lumbar region 03/06/2013   Past Medical History:  Diagnosis Date   A-fib [facility])    Acute on chronic diastolic CHF (congestive heart failure) ([place]) 07/21/2020   Coronary artery disease    Diabetes mellitus without complication (HCC)    Dyspnea    Dysrhythmia    A-fib   Hyperlipidemia    Hypertension    Metabolic syndrome    Prediabetes    Sleep apnea    STEMI (ST elevation myocardial infarction) ([place])     Family History  Problem Relation Age of Onset   Cancer Mother         lungs   Coronary artery disease Mother    Diabetes Brother    Heart attack Brother    Diabetes Maternal Grandmother    Arthritis Father     Past Surgical History:  Procedure Laterality Date   APPENDECTOMY     CHOLECYSTECTOMY N/A 03/02/2020   Procedure: LAPAROSCOPIC CHOLECYSTECTOMY;  Surgeon: Aviva Signs, MD;  Location: [facility];  Service: General;  Laterality: N/A;   CORONARY STENT INTERVENTION N/A 04/30/2020   Procedure: CORONARY STENT INTERVENTION;  Surgeon: Martinique, Peter M, MD;  Location: [place] [facility];  Service: Cardiovascular;  Laterality: N/A;   CORONARY/GRAFT ACUTE MI REVASCULARIZATION N/A 04/30/2020   Procedure: Coronary/Graft Acute MI Revascularization;  Surgeon: Martinique, Peter M, MD;  Location: [place] [facility];  Service: Cardiovascular;  Laterality: N/A;   Eyelid Surgery Bilateral    HERNIA REPAIR     I & D EXTREMITY Left 04/04/2022   Procedure: LEFT LEG DEBRIDEMENT;  Surgeon: Newt Minion, MD;  Location: [place];  Service: Orthopedics;  Laterality: Left;   I & D EXTREMITY Left 04/06/2022   Procedure: LEFT LEG DEBRIDEMENT;  Surgeon: Newt Minion, MD;  Location: [place];  Service: Orthopedics;  Laterality: Left;   knee tendons repair     LEFT HEART CATH AND CORONARY ANGIOGRAPHY N/A 04/30/2020   Procedure: LEFT HEART CATH AND CORONARY ANGIOGRAPHY;  Surgeon: Martinique, Peter M, MD;  Location: [place] [facility];  Service: Cardiovascular;  Laterality: N/A;   ROTATOR CUFF REPAIR Bilateral    TONSILLECTOMY     Social History   Occupational History   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

## 2022-05-01 ENCOUNTER — Encounter: Payer: Medicare Other | Admitting: Family

## 2022-05-01 DIAGNOSIS — B351 Tinea unguium: Secondary | ICD-10-CM | POA: Diagnosis not present

## 2022-05-01 DIAGNOSIS — L84 Corns and callosities: Secondary | ICD-10-CM | POA: Diagnosis not present

## 2022-05-01 DIAGNOSIS — M79676 Pain in unspecified toe(s): Secondary | ICD-10-CM | POA: Diagnosis not present

## 2022-05-01 DIAGNOSIS — I70203 Unspecified atherosclerosis of native arteries of extremities, bilateral legs: Secondary | ICD-10-CM | POA: Diagnosis not present

## 2022-05-02 DIAGNOSIS — N189 Chronic kidney disease, unspecified: Secondary | ICD-10-CM | POA: Diagnosis not present

## 2022-05-02 DIAGNOSIS — L03116 Cellulitis of left lower limb: Secondary | ICD-10-CM | POA: Diagnosis not present

## 2022-05-02 DIAGNOSIS — N179 Acute kidney failure, unspecified: Secondary | ICD-10-CM | POA: Diagnosis not present

## 2022-05-02 DIAGNOSIS — I130 Hypertensive heart and chronic kidney disease with heart failure and stage 1 through stage 4 chronic kidney disease, or unspecified chronic kidney disease: Secondary | ICD-10-CM | POA: Diagnosis not present

## 2022-05-02 DIAGNOSIS — I5043 Acute on chronic combined systolic (congestive) and diastolic (congestive) heart failure: Secondary | ICD-10-CM | POA: Diagnosis not present

## 2022-05-02 DIAGNOSIS — E1122 Type 2 diabetes mellitus with diabetic chronic kidney disease: Secondary | ICD-10-CM | POA: Diagnosis not present

## 2022-05-02 NOTE — Telephone Encounter (Signed)
TC from Otila Kluver w/ [facility] started resumption of care on 04/25/22, they had pt before he went into [facility] and resumed care when he was discharged home.

## 2022-05-03 ENCOUNTER — Ambulatory Visit (INDEPENDENT_AMBULATORY_CARE_PROVIDER_SITE_OTHER): Payer: Medicare Other | Admitting: Family Medicine

## 2022-05-03 ENCOUNTER — Encounter: Payer: Self-pay | Admitting: Family Medicine

## 2022-05-03 VITALS — BP 108/68 | HR 82 | Temp 97.6°F | Ht 68.0 in | Wt 293.0 lb

## 2022-05-03 DIAGNOSIS — L0390 Cellulitis, unspecified: Secondary | ICD-10-CM

## 2022-05-03 DIAGNOSIS — I340 Nonrheumatic mitral (valve) insufficiency: Secondary | ICD-10-CM | POA: Diagnosis not present

## 2022-05-03 DIAGNOSIS — I959 Hypotension, unspecified: Secondary | ICD-10-CM | POA: Diagnosis not present

## 2022-05-03 DIAGNOSIS — I2510 Atherosclerotic heart disease of native coronary artery without angina pectoris: Secondary | ICD-10-CM | POA: Diagnosis not present

## 2022-05-03 NOTE — Progress Notes (Signed)
BP 108/68   Pulse 82   Temp 97.6 F (36.4 C)   Ht '5\' 8"'$  (1.727 m)   Wt 293 lb (132.9 kg)   SpO2 98%   BMI 44.55 kg/m    Subjective:   Patient ID: Ryan Garner, male    DOB: 1945/11/14, 76 y.o.   MRN: [id number]  HPI: MATHEU Garner is a 76 y.o. male presenting on 05/03/2022 for Cellulitis (BLE follow from Dr. Sharol Given. Pt has had a wound vac and 2 skin grafts since last visit. Pt sees Dr. Sharol Given weekly and has wrapping changed weekly.)   HPI Patient is coming in for recheck for wound VAC care.  He has been seeing Dr. Sharol Given for wound care and things have been improving slowly.  He said during one of the procedures 4 weeks ago when they were replacing the wound VAC his heart did stop and had to do CPR or compressions on him.  Prior to procedure he became hypotensive and received CPR temporarily and placed on pressors.  He still complains of some chest discomfort from chest compressions but denies any breathing issues or chest tightness.  He does have follow-up with cardiology outpatient.  He says has been feeling good denies any significant issue currently.  His legs are wrapped currently and they are mostly working with him at Dr. Jess Barters office.  He has also had skin grafts.  Relevant past medical, surgical, family and social history reviewed and updated as indicated. Interim medical history since our last visit reviewed. Allergies and medications reviewed and updated.  Review of Systems  Constitutional:  Negative for chills and fever.  HENT:  Negative for congestion.   Respiratory:  Negative for cough, chest tightness, shortness of breath and wheezing.   Cardiovascular:  Positive for chest pain and leg swelling.  Musculoskeletal:  Negative for arthralgias, back pain, gait problem and myalgias.  Skin:  Positive for wound. Negative for rash.  All other systems reviewed and are negative.   Per HPI unless specifically indicated above   Allergies as of 05/03/2022       Reactions    Flecainide    Per patient dizziness, shortness of breath, blurred vision   Rofecoxib Rash, Other (See Comments)   (Vioxx)        Medication List        Accurate as of May 03, 2022 12:03 PM. If you have any questions, ask your nurse or doctor.          apixaban 5 MG Tabs tablet Commonly known as: ELIQUIS Take 1 tablet (5 mg total) by mouth 2 (two) times daily.   ascorbic acid 1000 MG tablet Commonly known as: VITAMIN C Take 1 tablet (1,000 mg total) by mouth daily.   atorvastatin 80 MG tablet Commonly known as: LIPITOR Take 1 tablet by mouth once daily   clopidogrel 75 MG tablet Commonly known as: PLAVIX Take 1 tablet (75 mg total) by mouth daily with breakfast.   cyanocobalamin 1000 MCG tablet Commonly known as: VITAMIN B12 Take 1,000 mcg by mouth daily.   doxycycline 100 MG tablet Commonly known as: VIBRA-TABS Take 1 tablet (100 mg total) by mouth every 12 (twelve) hours.   furosemide 80 MG tablet Commonly known as: LASIX Take 1 tablet (80 mg total) by mouth 2 (two) times daily.   metoprolol tartrate 25 MG tablet Commonly known as: LOPRESSOR Take 1 tablet (25 mg total) by mouth 2 (two) times daily.   multivitamin with minerals Tabs  tablet Take 1 tablet by mouth daily.   nitroGLYCERIN 0.4 MG SL tablet Commonly known as: NITROSTAT Place 1 tablet (0.4 mg total) under the tongue every 5 (five) minutes x 3 doses as needed for chest pain.   nutrition supplement (JUVEN) Pack Take 1 packet by mouth 2 (two) times daily between meals.   potassium chloride SA 20 MEQ tablet Commonly known as: KLOR-CON M Take 2 tablets (40 mEq total) by mouth daily.   Spiriva Respimat 1.25 MCG/ACT Aers Generic drug: Tiotropium Bromide Monohydrate Inhale 2 puffs into the lungs daily.   zinc sulfate 220 (50 Zn) MG capsule Take 1 capsule (220 mg total) by mouth daily.         Objective:   BP 108/68   Pulse 82   Temp 97.6 F (36.4 C)   Ht '5\' 8"'$  (1.727 m)   Wt 293  lb (132.9 kg)   SpO2 98%   BMI 44.55 kg/m   Wt Readings from Last 3 Encounters:  05/03/22 293 lb (132.9 kg)  04/26/22 283 lb (128.4 kg)  04/23/22 277 lb (125.6 kg)    Physical Exam Vitals and nursing note reviewed.  Constitutional:      General: He is not in acute distress.    Appearance: He is well-developed. He is not diaphoretic.  Eyes:     General: No scleral icterus.    Conjunctiva/sclera: Conjunctivae normal.  Neck:     Thyroid: No thyromegaly.  Cardiovascular:     Rate and Rhythm: Normal rate and regular rhythm.     Heart sounds: Normal heart sounds. No murmur heard. Pulmonary:     Effort: Pulmonary effort is normal. No respiratory distress.     Breath sounds: Normal breath sounds. No wheezing.  Musculoskeletal:        General: Swelling (2+ pitting edema) present. Normal range of motion.     Cervical back: Neck supple.  Lymphadenopathy:     Cervical: No cervical adenopathy.  Skin:    Comments: Legs wrapped on both sides, will not remove the wrap today because he is working with wound care  Neurological:     Mental Status: He is alert and oriented to person, place, and time.     Coordination: Coordination normal.  Psychiatric:        Behavior: Behavior normal.       Assessment & Plan:   Problem List Items Addressed This Visit       Cardiovascular and Mediastinum   Transient hypotension   Other Visit Diagnoses     Wound cellulitis    -  Primary   Nonrheumatic mitral valve regurgitation           Continue with wound care, things seem to be going well.  Continue with cardiology in the future once the wounds are healed for mitral valve issues. Follow up plan: Return if symptoms worsen or fail to improve.  Counseling provided for all of the vaccine components No orders of the defined types were placed in this encounter.   Caryl Pina, MD [facility] 05/03/2022, 12:03 PM

## 2022-05-05 DIAGNOSIS — I9589 Other hypotension: Secondary | ICD-10-CM | POA: Diagnosis not present

## 2022-05-05 DIAGNOSIS — N1831 Chronic kidney disease, stage 3a: Secondary | ICD-10-CM | POA: Diagnosis not present

## 2022-05-05 DIAGNOSIS — I2510 Atherosclerotic heart disease of native coronary artery without angina pectoris: Secondary | ICD-10-CM | POA: Diagnosis not present

## 2022-05-05 DIAGNOSIS — I130 Hypertensive heart and chronic kidney disease with heart failure and stage 1 through stage 4 chronic kidney disease, or unspecified chronic kidney disease: Secondary | ICD-10-CM | POA: Diagnosis not present

## 2022-05-05 DIAGNOSIS — E876 Hypokalemia: Secondary | ICD-10-CM | POA: Diagnosis not present

## 2022-05-05 DIAGNOSIS — I341 Nonrheumatic mitral (valve) prolapse: Secondary | ICD-10-CM | POA: Diagnosis not present

## 2022-05-05 DIAGNOSIS — I272 Pulmonary hypertension, unspecified: Secondary | ICD-10-CM | POA: Diagnosis not present

## 2022-05-05 DIAGNOSIS — I480 Paroxysmal atrial fibrillation: Secondary | ICD-10-CM | POA: Diagnosis not present

## 2022-05-05 DIAGNOSIS — E1122 Type 2 diabetes mellitus with diabetic chronic kidney disease: Secondary | ICD-10-CM | POA: Diagnosis not present

## 2022-05-05 DIAGNOSIS — I350 Nonrheumatic aortic (valve) stenosis: Secondary | ICD-10-CM | POA: Diagnosis not present

## 2022-05-05 DIAGNOSIS — R001 Bradycardia, unspecified: Secondary | ICD-10-CM | POA: Diagnosis not present

## 2022-05-05 DIAGNOSIS — E8881 Metabolic syndrome: Secondary | ICD-10-CM | POA: Diagnosis not present

## 2022-05-05 DIAGNOSIS — E785 Hyperlipidemia, unspecified: Secondary | ICD-10-CM | POA: Diagnosis not present

## 2022-05-05 DIAGNOSIS — I8393 Asymptomatic varicose veins of bilateral lower extremities: Secondary | ICD-10-CM | POA: Diagnosis not present

## 2022-05-05 DIAGNOSIS — Z6841 Body Mass Index (BMI) 40.0 and over, adult: Secondary | ICD-10-CM | POA: Diagnosis not present

## 2022-05-05 DIAGNOSIS — I252 Old myocardial infarction: Secondary | ICD-10-CM | POA: Diagnosis not present

## 2022-05-05 DIAGNOSIS — Z955 Presence of coronary angioplasty implant and graft: Secondary | ICD-10-CM | POA: Diagnosis not present

## 2022-05-05 DIAGNOSIS — I5033 Acute on chronic diastolic (congestive) heart failure: Secondary | ICD-10-CM | POA: Diagnosis not present

## 2022-05-05 DIAGNOSIS — D631 Anemia in chronic kidney disease: Secondary | ICD-10-CM | POA: Diagnosis not present

## 2022-05-05 DIAGNOSIS — J449 Chronic obstructive pulmonary disease, unspecified: Secondary | ICD-10-CM | POA: Diagnosis not present

## 2022-05-05 DIAGNOSIS — N179 Acute kidney failure, unspecified: Secondary | ICD-10-CM | POA: Diagnosis not present

## 2022-05-05 DIAGNOSIS — Z7901 Long term (current) use of anticoagulants: Secondary | ICD-10-CM | POA: Diagnosis not present

## 2022-05-05 DIAGNOSIS — E871 Hypo-osmolality and hyponatremia: Secondary | ICD-10-CM | POA: Diagnosis not present

## 2022-05-07 ENCOUNTER — Ambulatory Visit (INDEPENDENT_AMBULATORY_CARE_PROVIDER_SITE_OTHER): Payer: Medicare Other | Admitting: Orthopedic Surgery

## 2022-05-07 ENCOUNTER — Encounter (HOSPITAL_COMMUNITY): Payer: Self-pay | Admitting: Orthopedic Surgery

## 2022-05-07 DIAGNOSIS — L02416 Cutaneous abscess of left lower limb: Secondary | ICD-10-CM | POA: Diagnosis not present

## 2022-05-07 DIAGNOSIS — I890 Lymphedema, not elsewhere classified: Secondary | ICD-10-CM | POA: Diagnosis not present

## 2022-05-07 NOTE — Progress Notes (Signed)
Office Visit Note   Patient: Ryan Garner           Date of Birth: 06-Aug-1945           MRN: [id number] Visit Date: 05/07/2022              Requested by: [name], [name], MD [place],  [place] [address] PCP: [name], [name], MD  Chief Complaint  Patient presents with   Left Leg - Routine Post Op    04/06/2022 LLE debridement kerecis graft Approved in office kerecis application 67/3/41, 93/7/90, and today 05/07/22      HPI: Patient is a 76 year old gentleman who presents in follow-up left lower extremity.  Patient is status post debridement he still has a persistent ulcer.  Assessment & Plan: Visit Diagnoses:  1. Abscess of left lower leg   2. Lymphedema of both lower extremities     Plan: Ulcer was debrided and Kerecis micro graft applied.  Patient was given instructions to keep the Adaptic in place change the gauze dressing daily apply an Ace wrap daily.  Follow-Up Instructions: Return in about 1 week (around 05/14/2022).   Ortho Exam  Patient is alert, oriented, no adenopathy, well-dressed, normal affect, normal respiratory effort. Examination the proximal and distal incisions are well-healed the sutures are removed.  The wound measures 4 x 4 cm and 1 cm deep.  The wound healing has stalled, the wound bed has healthy granulation tissue, and patient presents for evaluation and application of [employer] Micro graft. After informed consent a 10 blade knife was used to debride the skin and soft tissue to healthy viable bleeding granulation tissue.  Silver nitrate was used for hemostasis. The wound measures: 4 cm in length, 4cm  in width, 10 mm in depth, wound location medial left calf Kerecis MariGen micro tissue graft 19 cm2 was applied, and there was no wastage.  Please see the photo below of the Lot number and expiration date. The micro tissue graft was covered with a nonadherent Adaptic dressing, bolstered with 4 x 4 gauze and secured with a  compression wrap.    Imaging: No results found.     Labs: Lab Results  Component Value Date   HGBA1C 7.0 (H) 12/15/2021   HGBA1C 6.4 (H) 04/28/2021   HGBA1C 6.7 10/27/2020   REPTSTATUS 04/11/2022 FINAL 04/04/2022   GRAMSTAIN NO WBC SEEN NO ORGANISMS SEEN  04/04/2022   CULT  04/04/2022    RARE SERRATIA MARCESCENS NO ANAEROBES ISOLATED Performed at [facility], [place] [address], [place], [place] [address]    LABORGA SERRATIA MARCESCENS 04/04/2022     Lab Results  Component Value Date   ALBUMIN 2.7 (L) 04/14/2022   ALBUMIN 2.6 (L) 04/13/2022   ALBUMIN 2.6 (L) 04/12/2022    Lab Results  Component Value Date   MG 1.6 (L) 04/14/2022   MG 1.5 (L) 04/13/2022   MG 1.7 04/11/2022   Lab Results  Component Value Date   VD25OH 36.7 04/14/2020    No results found for: "PREALBUMIN"    Latest Ref Rng & Units 04/14/2022    5:55 AM 04/13/2022    3:34 AM 04/12/2022    8:18 AM  CBC EXTENDED  WBC 4.0 - 10.5 K/uL 10.4  10.4  11.6   RBC 4.22 - 5.81 MIL/uL 3.44  3.44  3.33   Hemoglobin 13.0 - 17.0 g/dL 10.9  10.8  10.4   HCT 39.0 - 52.0 % 31.3  31.5  30.6   Platelets 150 - 400 K/uL 258  230  217   NEUT# 1.7 - 7.7 K/uL 7.1  7.3    Lymph# 0.7 - 4.0 K/uL 1.4  1.3       There is no height or weight on file to calculate BMI.  Orders:  No orders of the defined types were placed in this encounter.  No orders of the defined types were placed in this encounter.    Procedures: No procedures performed  Clinical Data: No additional findings.  ROS:  All other systems negative, except as noted in the HPI. Review of Systems  Objective: Vital Signs: There were no vitals taken for this visit.  Specialty Comments:  No specialty comments available.  PMFS History: Patient Active Problem List   Diagnosis Date Noted   Nonrheumatic mitral (valve) insufficiency    Aortic valve stenosis, nonrheumatic    Hypotension    Transient hypotension 04/06/2022   Bradycardia  04/06/2022   (HFpEF) heart failure with preserved ejection fraction ([place]) 04/06/2022   Abscess of leg, left 04/04/2022   Cellulitis of left lower extremity 03/02/2022   Chronic diastolic CHF (congestive heart failure) ([place]) 03/02/2022   Cellulitis of left leg 03/02/2022   Foot callus 12/15/2021   Other secondary pulmonary hypertension ([place]) 08/30/2021   COPD (chronic obstructive pulmonary disease) ([place]) 08/30/2021   Coronary artery disease due to type 2 diabetes mellitus ([place]) 07/21/2020   CHF (congestive heart failure) ([place]) 07/21/2020   History of ST elevation myocardial infarction (STEMI) 04/30/2020   Atrial fibrillation ([place]) 02/29/2020   Acute kidney injury superimposed on chronic kidney disease ([place]) 02/28/2020   Hyponatremia 02/28/2020   Lumbar radiculopathy 03/24/2019   Morbid obesity ([place]) 09/04/2018   Lung nodules 01/17/2017   Ascending aortic aneurysm 01/17/2017   Type 2 diabetes mellitus ([place]) 03/20/2016   Tear of lateral meniscus of right knee 11/19/2014   Hypertension    Hyperlipidemia    Metabolic syndrome    Osteoarthritis of both knees 07/06/2013   Acquired spondylolisthesis 03/06/2013   Degeneration of lumbar intervertebral disc 03/06/2013   Lumbar spondylosis 03/06/2013   Spinal stenosis of lumbar region 03/06/2013   Past Medical History:  Diagnosis Date   A-fib [facility])    Acute on chronic diastolic CHF (congestive heart failure) ([place]) 07/21/2020   Coronary artery disease    Diabetes mellitus without complication (HCC)    Dyspnea    Dysrhythmia    A-fib   Hyperlipidemia    Hypertension    Metabolic syndrome    Prediabetes    Sleep apnea    STEMI (ST elevation myocardial infarction) ([place])     Family History  Problem Relation Age of Onset   Cancer Mother        lungs   Coronary artery disease Mother    Diabetes Brother    Heart attack Brother    Diabetes Maternal Grandmother    Arthritis Father     Past Surgical History:  Procedure Laterality  Date   APPENDECTOMY     CHOLECYSTECTOMY N/A 03/02/2020   Procedure: LAPAROSCOPIC CHOLECYSTECTOMY;  Surgeon: Aviva Signs, MD;  Location: [facility];  Service: General;  Laterality: N/A;   CORONARY STENT INTERVENTION N/A 04/30/2020   Procedure: CORONARY STENT INTERVENTION;  Surgeon: Martinique, Peter M, MD;  Location: [place] [facility];  Service: Cardiovascular;  Laterality: N/A;   CORONARY/GRAFT ACUTE MI REVASCULARIZATION N/A 04/30/2020   Procedure: Coronary/Graft Acute MI Revascularization;  Surgeon: Martinique, Peter M, MD;  Location: [place]  [facility];  Service: Cardiovascular;  Laterality: N/A;   Eyelid Surgery Bilateral    HERNIA REPAIR     I & D EXTREMITY Left 04/06/2022   Procedure: LEFT LEG DEBRIDEMENT;  Surgeon: Newt Minion, MD;  Location: [place];  Service: Orthopedics;  Laterality: Left;   I & D EXTREMITY Left 04/04/2022   Procedure: LEFT LEG DEBRIDEMENT;  Surgeon: Newt Minion, MD;  Location: [place];  Service: Orthopedics;  Laterality: Left;   knee tendons repair     LEFT HEART CATH AND CORONARY ANGIOGRAPHY N/A 04/30/2020   Procedure: LEFT HEART CATH AND CORONARY ANGIOGRAPHY;  Surgeon: Martinique, Peter M, MD;  Location: [place] [facility];  Service: Cardiovascular;  Laterality: N/A;   ROTATOR CUFF REPAIR Bilateral    TONSILLECTOMY     Social History   Occupational History   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

## 2022-05-08 ENCOUNTER — Other Ambulatory Visit: Payer: Self-pay | Admitting: Family

## 2022-05-08 DIAGNOSIS — E1122 Type 2 diabetes mellitus with diabetic chronic kidney disease: Secondary | ICD-10-CM | POA: Diagnosis not present

## 2022-05-08 DIAGNOSIS — I5033 Acute on chronic diastolic (congestive) heart failure: Secondary | ICD-10-CM | POA: Diagnosis not present

## 2022-05-08 DIAGNOSIS — N179 Acute kidney failure, unspecified: Secondary | ICD-10-CM | POA: Diagnosis not present

## 2022-05-08 DIAGNOSIS — D631 Anemia in chronic kidney disease: Secondary | ICD-10-CM | POA: Diagnosis not present

## 2022-05-08 DIAGNOSIS — N1831 Chronic kidney disease, stage 3a: Secondary | ICD-10-CM | POA: Diagnosis not present

## 2022-05-08 DIAGNOSIS — I130 Hypertensive heart and chronic kidney disease with heart failure and stage 1 through stage 4 chronic kidney disease, or unspecified chronic kidney disease: Secondary | ICD-10-CM | POA: Diagnosis not present

## 2022-05-09 DIAGNOSIS — D631 Anemia in chronic kidney disease: Secondary | ICD-10-CM | POA: Diagnosis not present

## 2022-05-09 DIAGNOSIS — I5033 Acute on chronic diastolic (congestive) heart failure: Secondary | ICD-10-CM | POA: Diagnosis not present

## 2022-05-09 DIAGNOSIS — N1831 Chronic kidney disease, stage 3a: Secondary | ICD-10-CM | POA: Diagnosis not present

## 2022-05-09 DIAGNOSIS — E1122 Type 2 diabetes mellitus with diabetic chronic kidney disease: Secondary | ICD-10-CM | POA: Diagnosis not present

## 2022-05-09 DIAGNOSIS — N179 Acute kidney failure, unspecified: Secondary | ICD-10-CM | POA: Diagnosis not present

## 2022-05-09 DIAGNOSIS — I130 Hypertensive heart and chronic kidney disease with heart failure and stage 1 through stage 4 chronic kidney disease, or unspecified chronic kidney disease: Secondary | ICD-10-CM | POA: Diagnosis not present

## 2022-05-11 ENCOUNTER — Ambulatory Visit: Payer: Medicare Other | Admitting: Internal Medicine

## 2022-05-11 DIAGNOSIS — D631 Anemia in chronic kidney disease: Secondary | ICD-10-CM | POA: Diagnosis not present

## 2022-05-11 DIAGNOSIS — I5033 Acute on chronic diastolic (congestive) heart failure: Secondary | ICD-10-CM | POA: Diagnosis not present

## 2022-05-11 DIAGNOSIS — N179 Acute kidney failure, unspecified: Secondary | ICD-10-CM | POA: Diagnosis not present

## 2022-05-11 DIAGNOSIS — N1831 Chronic kidney disease, stage 3a: Secondary | ICD-10-CM | POA: Diagnosis not present

## 2022-05-11 DIAGNOSIS — I130 Hypertensive heart and chronic kidney disease with heart failure and stage 1 through stage 4 chronic kidney disease, or unspecified chronic kidney disease: Secondary | ICD-10-CM | POA: Diagnosis not present

## 2022-05-11 DIAGNOSIS — E1122 Type 2 diabetes mellitus with diabetic chronic kidney disease: Secondary | ICD-10-CM | POA: Diagnosis not present

## 2022-05-12 ENCOUNTER — Other Ambulatory Visit: Payer: Self-pay | Admitting: Family

## 2022-05-14 ENCOUNTER — Ambulatory Visit (INDEPENDENT_AMBULATORY_CARE_PROVIDER_SITE_OTHER): Payer: Medicare Other | Admitting: Orthopedic Surgery

## 2022-05-14 ENCOUNTER — Encounter: Payer: Self-pay | Admitting: Orthopedic Surgery

## 2022-05-14 DIAGNOSIS — I890 Lymphedema, not elsewhere classified: Secondary | ICD-10-CM | POA: Diagnosis not present

## 2022-05-14 DIAGNOSIS — L97921 Non-pressure chronic ulcer of unspecified part of left lower leg limited to breakdown of skin: Secondary | ICD-10-CM

## 2022-05-14 NOTE — Progress Notes (Signed)
Office Visit Note   Patient: Ryan Garner           Date of Birth: 1945-11-06           MRN: [id number] Visit Date: 05/14/2022              Requested by: [name], [name], MD [place],  [place] [address] PCP: [name], [name], MD  Chief Complaint  Patient presents with   Left Leg - Routine Post Op    04/06/2022 LLE debridement kerecis graft Approved in office kerecis application 95/6/38, 75/6/43, and 05/07/22       HPI: Patient is a 76 year old gentleman status post initial debridement hematoma left leg on 04/06/2022.  Patient subsequently underwent in office application of Kerecis tissue graft October 4, October 9, October 16, patient is also to be evaluated today for lymphedema pumps.  Assessment & Plan: Visit Diagnoses:  1. Lymphedema of both lower extremities   2. Nonhealing ulcer of left lower leg limited to breakdown of skin ([place])     Plan: Patient seen in consult with Megan with tactile for his lymphedema pumps.  The Kerecis micro powder was applied with a Praveena wound VAC.  Follow-Up Instructions: Return in about 1 week (around 05/21/2022).   Ortho Exam  Patient is alert, oriented, no adenopathy, well-dressed, normal affect, normal respiratory effort. Examination the wound bed has healthy granulation tissue.  The wound healing has stalled, the wound bed has healthy granulation tissue, and patient presents for evaluation and application of [place] Micro graft. After informed consent a 10 blade knife was used to debride the skin and soft tissue to healthy viable bleeding granulation tissue.  Silver nitrate was used for hemostasis. The wound measures: 6 cm in length, 3cm  in width, 10 mm in depth, wound location left leg Kerecis MariGen micro tissue graft 19 cm2 was applied, and there was no wastage.  Please see the photo below of the Lot number and expiration date. The Kerecis micro graft was covered with 2 cleanse choice sponges secured with  a wound VAC.  This had a good suction fit this was overwrapped with compression.     Imaging: No results found.     Labs: Lab Results  Component Value Date   HGBA1C 7.0 (H) 12/15/2021   HGBA1C 6.4 (H) 04/28/2021   HGBA1C 6.7 10/27/2020   REPTSTATUS 04/11/2022 FINAL 04/04/2022   GRAMSTAIN NO WBC SEEN NO ORGANISMS SEEN  04/04/2022   CULT  04/04/2022    RARE SERRATIA MARCESCENS NO ANAEROBES ISOLATED Performed at [facility], [place] [address] [place], [place] [address]    LABORGA SERRATIA MARCESCENS 04/04/2022     Lab Results  Component Value Date   ALBUMIN 2.7 (L) 04/14/2022   ALBUMIN 2.6 (L) 04/13/2022   ALBUMIN 2.6 (L) 04/12/2022    Lab Results  Component Value Date   MG 1.6 (L) 04/14/2022   MG 1.5 (L) 04/13/2022   MG 1.7 04/11/2022   Lab Results  Component Value Date   VD25OH 36.7 04/14/2020    No results found for: "PREALBUMIN"    Latest Ref Rng & Units 04/14/2022    5:55 AM 04/13/2022    3:34 AM 04/12/2022    8:18 AM  CBC EXTENDED  WBC 4.0 - 10.5 K/uL 10.4  10.4  11.6   RBC 4.22 - 5.81 MIL/uL 3.44  3.44  3.33   Hemoglobin 13.0 - 17.0 g/dL 10.9  10.8  10.4  HCT 39.0 - 52.0 % 31.3  31.5  30.6   Platelets 150 - 400 K/uL 258  230  217   NEUT# 1.7 - 7.7 K/uL 7.1  7.3    Lymph# 0.7 - 4.0 K/uL 1.4  1.3       There is no height or weight on file to calculate BMI.  Orders:  No orders of the defined types were placed in this encounter.  No orders of the defined types were placed in this encounter.    Procedures: No procedures performed  Clinical Data: No additional findings.  ROS:  All other systems negative, except as noted in the HPI. Review of Systems  Objective: Vital Signs: There were no vitals taken for this visit.  Specialty Comments:  No specialty comments available.  PMFS History: Patient Active Problem List   Diagnosis Date Noted   Nonrheumatic mitral (valve) insufficiency    Aortic valve stenosis, nonrheumatic     Hypotension    Transient hypotension 04/06/2022   Bradycardia 04/06/2022   (HFpEF) heart failure with preserved ejection fraction ([place]) 04/06/2022   Abscess of leg, left 04/04/2022   Cellulitis of left lower extremity 03/02/2022   Chronic diastolic CHF (congestive heart failure) ([place]) 03/02/2022   Cellulitis of left leg 03/02/2022   Foot callus 12/15/2021   Other secondary pulmonary hypertension ([place]) 08/30/2021   COPD (chronic obstructive pulmonary disease) ([place]) 08/30/2021   Coronary artery disease due to type 2 diabetes mellitus ([place]) 07/21/2020   CHF (congestive heart failure) ([place]) 07/21/2020   History of ST elevation myocardial infarction (STEMI) 04/30/2020   Atrial fibrillation ([place]) 02/29/2020   Acute kidney injury superimposed on chronic kidney disease ([place]) 02/28/2020   Hyponatremia 02/28/2020   Lumbar radiculopathy 03/24/2019   Morbid obesity ([place]) 09/04/2018   Lung nodules 01/17/2017   Ascending aortic aneurysm 01/17/2017   Type 2 diabetes mellitus ([place]) 03/20/2016   Tear of lateral meniscus of right knee 11/19/2014   Hypertension    Hyperlipidemia    Metabolic syndrome    Osteoarthritis of both knees 07/06/2013   Acquired spondylolisthesis 03/06/2013   Degeneration of lumbar intervertebral disc 03/06/2013   Lumbar spondylosis 03/06/2013   Spinal stenosis of lumbar region 03/06/2013   Past Medical History:  Diagnosis Date   A-fib [facility])    Acute on chronic diastolic CHF (congestive heart failure) ([place]) 07/21/2020   Coronary artery disease    Diabetes mellitus without complication (HCC)    Dyspnea    Dysrhythmia    A-fib   Hyperlipidemia    Hypertension    Metabolic syndrome    Prediabetes    Sleep apnea    STEMI (ST elevation myocardial infarction) ([place])     Family History  Problem Relation Age of Onset   Cancer Mother        lungs   Coronary artery disease Mother    Diabetes Brother    Heart attack Brother    Diabetes Maternal Grandmother     Arthritis Father     Past Surgical History:  Procedure Laterality Date   APPENDECTOMY     CHOLECYSTECTOMY N/A 03/02/2020   Procedure: LAPAROSCOPIC CHOLECYSTECTOMY;  Surgeon: Aviva Signs, MD;  Location: [facility];  Service: General;  Laterality: N/A;   CORONARY STENT INTERVENTION N/A 04/30/2020   Procedure: CORONARY STENT INTERVENTION;  Surgeon: Martinique, Peter M, MD;  Location: [facility];  Service: Cardiovascular;  Laterality: N/A;   CORONARY/GRAFT ACUTE MI REVASCULARIZATION N/A 04/30/2020   Procedure: Coronary/Graft Acute MI Revascularization;  Surgeon: Martinique, Peter M, MD;  Location: [facility];  Service: Cardiovascular;  Laterality: N/A;   Eyelid Surgery Bilateral    HERNIA REPAIR     I & D EXTREMITY Left 04/06/2022   Procedure: LEFT LEG DEBRIDEMENT;  Surgeon: Newt Minion, MD;  Location: [facility];  Service: Orthopedics;  Laterality: Left;   I & D EXTREMITY Left 04/04/2022   Procedure: LEFT LEG DEBRIDEMENT;  Surgeon: Newt Minion, MD;  Location: [facility];  Service: Orthopedics;  Laterality: Left;   knee tendons repair     LEFT HEART CATH AND CORONARY ANGIOGRAPHY N/A 04/30/2020   Procedure: LEFT HEART CATH AND CORONARY ANGIOGRAPHY;  Surgeon: Martinique, Peter M, MD;  Location: [facility];  Service: Cardiovascular;  Laterality: N/A;   ROTATOR CUFF REPAIR Bilateral    TONSILLECTOMY     Social History   Occupational History   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

## 2022-05-15 DIAGNOSIS — I5033 Acute on chronic diastolic (congestive) heart failure: Secondary | ICD-10-CM | POA: Diagnosis not present

## 2022-05-15 DIAGNOSIS — N1831 Chronic kidney disease, stage 3a: Secondary | ICD-10-CM | POA: Diagnosis not present

## 2022-05-15 DIAGNOSIS — N179 Acute kidney failure, unspecified: Secondary | ICD-10-CM | POA: Diagnosis not present

## 2022-05-15 DIAGNOSIS — D631 Anemia in chronic kidney disease: Secondary | ICD-10-CM | POA: Diagnosis not present

## 2022-05-15 DIAGNOSIS — E1122 Type 2 diabetes mellitus with diabetic chronic kidney disease: Secondary | ICD-10-CM | POA: Diagnosis not present

## 2022-05-15 DIAGNOSIS — I130 Hypertensive heart and chronic kidney disease with heart failure and stage 1 through stage 4 chronic kidney disease, or unspecified chronic kidney disease: Secondary | ICD-10-CM | POA: Diagnosis not present

## 2022-05-16 ENCOUNTER — Ambulatory Visit (INDEPENDENT_AMBULATORY_CARE_PROVIDER_SITE_OTHER): Payer: Medicare Other

## 2022-05-16 DIAGNOSIS — I480 Paroxysmal atrial fibrillation: Secondary | ICD-10-CM | POA: Diagnosis not present

## 2022-05-16 DIAGNOSIS — I130 Hypertensive heart and chronic kidney disease with heart failure and stage 1 through stage 4 chronic kidney disease, or unspecified chronic kidney disease: Secondary | ICD-10-CM | POA: Diagnosis not present

## 2022-05-16 DIAGNOSIS — I9589 Other hypotension: Secondary | ICD-10-CM

## 2022-05-16 DIAGNOSIS — I252 Old myocardial infarction: Secondary | ICD-10-CM | POA: Diagnosis not present

## 2022-05-16 DIAGNOSIS — J449 Chronic obstructive pulmonary disease, unspecified: Secondary | ICD-10-CM

## 2022-05-16 DIAGNOSIS — I2510 Atherosclerotic heart disease of native coronary artery without angina pectoris: Secondary | ICD-10-CM

## 2022-05-16 DIAGNOSIS — N179 Acute kidney failure, unspecified: Secondary | ICD-10-CM

## 2022-05-16 DIAGNOSIS — I8393 Asymptomatic varicose veins of bilateral lower extremities: Secondary | ICD-10-CM

## 2022-05-16 DIAGNOSIS — I5033 Acute on chronic diastolic (congestive) heart failure: Secondary | ICD-10-CM | POA: Diagnosis not present

## 2022-05-16 DIAGNOSIS — E8881 Metabolic syndrome: Secondary | ICD-10-CM | POA: Diagnosis not present

## 2022-05-16 DIAGNOSIS — I350 Nonrheumatic aortic (valve) stenosis: Secondary | ICD-10-CM

## 2022-05-16 DIAGNOSIS — I272 Pulmonary hypertension, unspecified: Secondary | ICD-10-CM | POA: Diagnosis not present

## 2022-05-16 DIAGNOSIS — N1831 Chronic kidney disease, stage 3a: Secondary | ICD-10-CM

## 2022-05-16 DIAGNOSIS — I341 Nonrheumatic mitral (valve) prolapse: Secondary | ICD-10-CM

## 2022-05-16 DIAGNOSIS — D631 Anemia in chronic kidney disease: Secondary | ICD-10-CM | POA: Diagnosis not present

## 2022-05-16 DIAGNOSIS — E1122 Type 2 diabetes mellitus with diabetic chronic kidney disease: Secondary | ICD-10-CM | POA: Diagnosis not present

## 2022-05-17 DIAGNOSIS — E1122 Type 2 diabetes mellitus with diabetic chronic kidney disease: Secondary | ICD-10-CM | POA: Diagnosis not present

## 2022-05-17 DIAGNOSIS — D631 Anemia in chronic kidney disease: Secondary | ICD-10-CM | POA: Diagnosis not present

## 2022-05-17 DIAGNOSIS — N1831 Chronic kidney disease, stage 3a: Secondary | ICD-10-CM | POA: Diagnosis not present

## 2022-05-17 DIAGNOSIS — I130 Hypertensive heart and chronic kidney disease with heart failure and stage 1 through stage 4 chronic kidney disease, or unspecified chronic kidney disease: Secondary | ICD-10-CM | POA: Diagnosis not present

## 2022-05-17 DIAGNOSIS — I5033 Acute on chronic diastolic (congestive) heart failure: Secondary | ICD-10-CM | POA: Diagnosis not present

## 2022-05-17 DIAGNOSIS — N179 Acute kidney failure, unspecified: Secondary | ICD-10-CM | POA: Diagnosis not present

## 2022-05-18 DIAGNOSIS — N179 Acute kidney failure, unspecified: Secondary | ICD-10-CM | POA: Diagnosis not present

## 2022-05-18 DIAGNOSIS — I5033 Acute on chronic diastolic (congestive) heart failure: Secondary | ICD-10-CM | POA: Diagnosis not present

## 2022-05-18 DIAGNOSIS — E1122 Type 2 diabetes mellitus with diabetic chronic kidney disease: Secondary | ICD-10-CM | POA: Diagnosis not present

## 2022-05-18 DIAGNOSIS — N1831 Chronic kidney disease, stage 3a: Secondary | ICD-10-CM | POA: Diagnosis not present

## 2022-05-18 DIAGNOSIS — I130 Hypertensive heart and chronic kidney disease with heart failure and stage 1 through stage 4 chronic kidney disease, or unspecified chronic kidney disease: Secondary | ICD-10-CM | POA: Diagnosis not present

## 2022-05-18 DIAGNOSIS — D631 Anemia in chronic kidney disease: Secondary | ICD-10-CM | POA: Diagnosis not present

## 2022-05-21 ENCOUNTER — Encounter: Payer: Self-pay | Admitting: *Deleted

## 2022-05-21 ENCOUNTER — Ambulatory Visit: Payer: Self-pay | Admitting: *Deleted

## 2022-05-21 ENCOUNTER — Ambulatory Visit (INDEPENDENT_AMBULATORY_CARE_PROVIDER_SITE_OTHER): Payer: Medicare Other | Admitting: Orthopedic Surgery

## 2022-05-21 DIAGNOSIS — I890 Lymphedema, not elsewhere classified: Secondary | ICD-10-CM

## 2022-05-21 DIAGNOSIS — L97921 Non-pressure chronic ulcer of unspecified part of left lower leg limited to breakdown of skin: Secondary | ICD-10-CM

## 2022-05-21 NOTE — Patient Outreach (Signed)
  Care Coordination   Initial Visit Note   05/21/2022 Name: Ryan Garner MRN: [id number] DOB: 18-Apr-1946  Ryan Garner is a 76 y.o. year old male who sees [name], [name], MD for primary care. I spoke with  Ryan Garner by phone today.  What matters to the patients health and wellness today?  Ongoing self management of chronic medical conditions     Goals Addressed             This Visit's Progress    Care Coordination Services (No Follow up Required)       Care Coordination Interventions: Evaluation of current treatment plan related to lymphedema of lower extremity and non-healing ulcer and patient's adherence to plan as established by provider Reviewed medications with patient and discussed access and affordability Assessed social determinant of health barriers Reviewed upcoming appt with Dr Ryan Garner (ortho) on 05/21/22 Assessed mobility and ability to perform ADLs Assessed family/Social support Provided patient/caregiver with verbal information on Ryan Garner [phone]) Encouraged patient to request a referral for [place] from PCP if services are needed in the future         SDOH assessments and interventions completed:  Yes  SDOH Interventions Today    Flowsheet Row Most Recent Value  SDOH Interventions   Housing Interventions Intervention Not Indicated  Transportation Interventions Intervention Not Indicated  Financial Strain Interventions Intervention Not Indicated        Care Coordination Interventions Activated:  Yes  Care Coordination Interventions:  Yes, provided   Follow up plan: No further intervention required.   Encounter Outcome:  Pt. Visit Completed   Ryan Garner, BSN, RN-BC RN Care Coordinator [place] Direct Dial: [phone] Main #: [phone]

## 2022-05-22 ENCOUNTER — Encounter: Payer: Self-pay | Admitting: Orthopedic Surgery

## 2022-05-22 DIAGNOSIS — D631 Anemia in chronic kidney disease: Secondary | ICD-10-CM | POA: Diagnosis not present

## 2022-05-22 DIAGNOSIS — N179 Acute kidney failure, unspecified: Secondary | ICD-10-CM | POA: Diagnosis not present

## 2022-05-22 DIAGNOSIS — I5033 Acute on chronic diastolic (congestive) heart failure: Secondary | ICD-10-CM | POA: Diagnosis not present

## 2022-05-22 DIAGNOSIS — N1831 Chronic kidney disease, stage 3a: Secondary | ICD-10-CM | POA: Diagnosis not present

## 2022-05-22 DIAGNOSIS — E1122 Type 2 diabetes mellitus with diabetic chronic kidney disease: Secondary | ICD-10-CM | POA: Diagnosis not present

## 2022-05-22 DIAGNOSIS — I130 Hypertensive heart and chronic kidney disease with heart failure and stage 1 through stage 4 chronic kidney disease, or unspecified chronic kidney disease: Secondary | ICD-10-CM | POA: Diagnosis not present

## 2022-05-22 NOTE — Progress Notes (Signed)
Office Visit Note   Patient: Ryan Garner           Date of Birth: [birth year]           MRN: [id number] Visit Date: 05/21/2022              Requested by: [name], [name], MD [place],  [place] [address] PCP: [name], [name], MD  Chief Complaint  Patient presents with   Left Leg - Routine Post Op    04/06/2022 LLE debridement kerecis graft In office application of approved Kerecis graft 04/25/2022, 04/30/2022, 05/07/22, 05/14/22 with a vac on this date.       HPI: That time.  Patient is currently pending delivery of lymphedema pumps. Patient is a 76 year old gentleman who is 6 weeks status post debridement left lower extremity application of Kerecis tissue graft.  Patient is also status post application of an office tissue graft on October 4, October 9, October 16 and October 23 with a VAC applied on October 23.  Patient is pending delivery of lymphedema pumps.  Assessment & Plan: Visit Diagnoses:  1. Lymphedema of both lower extremities   2. Nonhealing ulcer of left lower leg limited to breakdown of skin ([place])     Plan: Discussed the importance of elevation range of motion of the toes and ankle and exercise.  Continue daily compression dressings.  Follow-Up Instructions: Return in about 2 weeks (around 06/04/2022).   Ortho Exam  Patient is alert, oriented, no adenopathy, well-dressed, normal affect, normal respiratory effort. Examination patient still has massive swelling of both legs.  The wound bed has 50% healthy granulation tissue 50% fibrinous tissue.  There is decreased depth.  The wound measures 5.5 cm x 2 cm x 1 cm deep.  Imaging: No results found.    Labs: Lab Results  Component Value Date   HGBA1C 7.0 (H) 12/15/2021   HGBA1C 6.4 (H) 04/28/2021   HGBA1C 6.7 10/27/2020   REPTSTATUS 04/11/2022 FINAL 04/04/2022   GRAMSTAIN NO WBC SEEN NO ORGANISMS SEEN  04/04/2022   CULT  04/04/2022    RARE SERRATIA MARCESCENS NO ANAEROBES  ISOLATED Performed at [facility], [place] [address] [place], [place] [address]    LABORGA SERRATIA MARCESCENS 04/04/2022     Lab Results  Component Value Date   ALBUMIN 2.7 (L) 04/14/2022   ALBUMIN 2.6 (L) 04/13/2022   ALBUMIN 2.6 (L) 04/12/2022    Lab Results  Component Value Date   MG 1.6 (L) 04/14/2022   MG 1.5 (L) 04/13/2022   MG 1.7 04/11/2022   Lab Results  Component Value Date   VD25OH 36.7 04/14/2020    No results found for: "PREALBUMIN"    Latest Ref Rng & Units 04/14/2022    5:55 AM 04/13/2022    3:34 AM 04/12/2022    8:18 AM  CBC EXTENDED  WBC 4.0 - 10.5 K/uL 10.4  10.4  11.6   RBC 4.22 - 5.81 MIL/uL 3.44  3.44  3.33   Hemoglobin 13.0 - 17.0 g/dL 10.9  10.8  10.4   HCT 39.0 - 52.0 % 31.3  31.5  30.6   Platelets 150 - 400 K/uL 258  230  217   NEUT# 1.7 - 7.7 K/uL 7.1  7.3    Lymph# 0.7 - 4.0 K/uL 1.4  1.3       There is no height or weight on file to calculate BMI.  Orders:  No orders of the defined types were  placed in this encounter.  No orders of the defined types were placed in this encounter.    Procedures: No procedures performed  Clinical Data: No additional findings.  ROS:  All other systems negative, except as noted in the HPI. Review of Systems  Objective: Vital Signs: There were no vitals taken for this visit.  Specialty Comments:  No specialty comments available.  PMFS History: Patient Active Problem List   Diagnosis Date Noted   Nonrheumatic mitral (valve) insufficiency    Aortic valve stenosis, nonrheumatic    Hypotension    Transient hypotension 04/06/2022   Bradycardia 04/06/2022   (HFpEF) heart failure with preserved ejection fraction ([place]) 04/06/2022   Abscess of leg, left 04/04/2022   Cellulitis of left lower extremity 03/02/2022   Chronic diastolic CHF (congestive heart failure) ([place]) 03/02/2022   Cellulitis of left leg 03/02/2022   Foot callus 12/15/2021   Other secondary pulmonary hypertension ([place])  08/30/2021   COPD (chronic obstructive pulmonary disease) ([place]) 08/30/2021   Coronary artery disease due to type 2 diabetes mellitus ([place]) 07/21/2020   CHF (congestive heart failure) ([place]) 07/21/2020   History of ST elevation myocardial infarction (STEMI) 04/30/2020   Atrial fibrillation ([place]) 02/29/2020   Acute kidney injury superimposed on chronic kidney disease ([place]) 02/28/2020   Hyponatremia 02/28/2020   Lumbar radiculopathy 03/24/2019   Morbid obesity ([place]) 09/04/2018   Lung nodules 01/17/2017   Ascending aortic aneurysm 01/17/2017   Type 2 diabetes mellitus ([place]) 03/20/2016   Tear of lateral meniscus of right knee 11/19/2014   Hypertension    Hyperlipidemia    Metabolic syndrome    Osteoarthritis of both knees 07/06/2013   Acquired spondylolisthesis 03/06/2013   Degeneration of lumbar intervertebral disc 03/06/2013   Lumbar spondylosis 03/06/2013   Spinal stenosis of lumbar region 03/06/2013   Past Medical History:  Diagnosis Date   A-fib [facility])    Acute on chronic diastolic CHF (congestive heart failure) ([place]) 07/21/2020   Coronary artery disease    Diabetes mellitus without complication (HCC)    Dyspnea    Dysrhythmia    A-fib   Hyperlipidemia    Hypertension    Metabolic syndrome    Prediabetes    Sleep apnea    STEMI (ST elevation myocardial infarction) ([place])     Family History  Problem Relation Age of Onset   Cancer Mother        lungs   Coronary artery disease Mother    Diabetes Brother    Heart attack Brother    Diabetes Maternal Grandmother    Arthritis Father     Past Surgical History:  Procedure Laterality Date   APPENDECTOMY     CHOLECYSTECTOMY N/A 03/02/2020   Procedure: LAPAROSCOPIC CHOLECYSTECTOMY;  Surgeon: Aviva Signs, MD;  Location: [facility];  Service: General;  Laterality: N/A;   CORONARY STENT INTERVENTION N/A 04/30/2020   Procedure: CORONARY STENT INTERVENTION;  Surgeon: Martinique, Peter M, MD;  Location: [facility];  Service:  Cardiovascular;  Laterality: N/A;   CORONARY/GRAFT ACUTE MI REVASCULARIZATION N/A 04/30/2020   Procedure: Coronary/Graft Acute MI Revascularization;  Surgeon: Martinique, Peter M, MD;  Location: [facility];  Service: Cardiovascular;  Laterality: N/A;   Eyelid Surgery Bilateral    HERNIA REPAIR     I & D EXTREMITY Left 04/06/2022   Procedure: LEFT LEG DEBRIDEMENT;  Surgeon: Newt Minion, MD;  Location: [facility];  Service: Orthopedics;  Laterality: Left;   I & D EXTREMITY Left 04/04/2022   Procedure:  LEFT LEG DEBRIDEMENT;  Surgeon: Newt Minion, MD;  Location: [facility];  Service: Orthopedics;  Laterality: Left;   knee tendons repair     LEFT HEART CATH AND CORONARY ANGIOGRAPHY N/A 04/30/2020   Procedure: LEFT HEART CATH AND CORONARY ANGIOGRAPHY;  Surgeon: Martinique, Peter M, MD;  Location: [facility];  Service: Cardiovascular;  Laterality: N/A;   ROTATOR CUFF REPAIR Bilateral    TONSILLECTOMY     Social History   Occupational History   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

## 2022-05-24 DIAGNOSIS — N179 Acute kidney failure, unspecified: Secondary | ICD-10-CM | POA: Diagnosis not present

## 2022-05-24 DIAGNOSIS — I130 Hypertensive heart and chronic kidney disease with heart failure and stage 1 through stage 4 chronic kidney disease, or unspecified chronic kidney disease: Secondary | ICD-10-CM | POA: Diagnosis not present

## 2022-05-24 DIAGNOSIS — N1831 Chronic kidney disease, stage 3a: Secondary | ICD-10-CM | POA: Diagnosis not present

## 2022-05-24 DIAGNOSIS — I5033 Acute on chronic diastolic (congestive) heart failure: Secondary | ICD-10-CM | POA: Diagnosis not present

## 2022-05-24 DIAGNOSIS — E1122 Type 2 diabetes mellitus with diabetic chronic kidney disease: Secondary | ICD-10-CM | POA: Diagnosis not present

## 2022-05-24 DIAGNOSIS — D631 Anemia in chronic kidney disease: Secondary | ICD-10-CM | POA: Diagnosis not present

## 2022-05-26 ENCOUNTER — Other Ambulatory Visit: Payer: Self-pay | Admitting: Orthopedic Surgery

## 2022-05-26 ENCOUNTER — Other Ambulatory Visit: Payer: Self-pay | Admitting: Family

## 2022-05-28 DIAGNOSIS — D631 Anemia in chronic kidney disease: Secondary | ICD-10-CM | POA: Diagnosis not present

## 2022-05-28 DIAGNOSIS — I5033 Acute on chronic diastolic (congestive) heart failure: Secondary | ICD-10-CM | POA: Diagnosis not present

## 2022-05-28 DIAGNOSIS — N179 Acute kidney failure, unspecified: Secondary | ICD-10-CM | POA: Diagnosis not present

## 2022-05-28 DIAGNOSIS — N1831 Chronic kidney disease, stage 3a: Secondary | ICD-10-CM | POA: Diagnosis not present

## 2022-05-28 DIAGNOSIS — I130 Hypertensive heart and chronic kidney disease with heart failure and stage 1 through stage 4 chronic kidney disease, or unspecified chronic kidney disease: Secondary | ICD-10-CM | POA: Diagnosis not present

## 2022-05-28 DIAGNOSIS — E1122 Type 2 diabetes mellitus with diabetic chronic kidney disease: Secondary | ICD-10-CM | POA: Diagnosis not present

## 2022-05-28 NOTE — Telephone Encounter (Signed)
Pt informed

## 2022-05-29 DIAGNOSIS — I5033 Acute on chronic diastolic (congestive) heart failure: Secondary | ICD-10-CM | POA: Diagnosis not present

## 2022-05-29 DIAGNOSIS — E1122 Type 2 diabetes mellitus with diabetic chronic kidney disease: Secondary | ICD-10-CM | POA: Diagnosis not present

## 2022-05-29 DIAGNOSIS — N1831 Chronic kidney disease, stage 3a: Secondary | ICD-10-CM | POA: Diagnosis not present

## 2022-05-29 DIAGNOSIS — N179 Acute kidney failure, unspecified: Secondary | ICD-10-CM | POA: Diagnosis not present

## 2022-05-29 DIAGNOSIS — I130 Hypertensive heart and chronic kidney disease with heart failure and stage 1 through stage 4 chronic kidney disease, or unspecified chronic kidney disease: Secondary | ICD-10-CM | POA: Diagnosis not present

## 2022-05-29 DIAGNOSIS — D631 Anemia in chronic kidney disease: Secondary | ICD-10-CM | POA: Diagnosis not present

## 2022-05-30 ENCOUNTER — Other Ambulatory Visit: Payer: Self-pay | Admitting: Family

## 2022-05-31 DIAGNOSIS — I130 Hypertensive heart and chronic kidney disease with heart failure and stage 1 through stage 4 chronic kidney disease, or unspecified chronic kidney disease: Secondary | ICD-10-CM | POA: Diagnosis not present

## 2022-05-31 DIAGNOSIS — N179 Acute kidney failure, unspecified: Secondary | ICD-10-CM | POA: Diagnosis not present

## 2022-05-31 DIAGNOSIS — I5033 Acute on chronic diastolic (congestive) heart failure: Secondary | ICD-10-CM | POA: Diagnosis not present

## 2022-05-31 DIAGNOSIS — E1122 Type 2 diabetes mellitus with diabetic chronic kidney disease: Secondary | ICD-10-CM | POA: Diagnosis not present

## 2022-05-31 DIAGNOSIS — N1831 Chronic kidney disease, stage 3a: Secondary | ICD-10-CM | POA: Diagnosis not present

## 2022-05-31 DIAGNOSIS — D631 Anemia in chronic kidney disease: Secondary | ICD-10-CM | POA: Diagnosis not present

## 2022-06-03 ENCOUNTER — Other Ambulatory Visit: Payer: Self-pay | Admitting: Family

## 2022-06-03 ENCOUNTER — Other Ambulatory Visit: Payer: Self-pay | Admitting: Orthopedic Surgery

## 2022-06-04 ENCOUNTER — Ambulatory Visit (INDEPENDENT_AMBULATORY_CARE_PROVIDER_SITE_OTHER): Payer: Medicare Other | Admitting: Orthopedic Surgery

## 2022-06-04 ENCOUNTER — Encounter: Payer: Self-pay | Admitting: Orthopedic Surgery

## 2022-06-04 DIAGNOSIS — Z955 Presence of coronary angioplasty implant and graft: Secondary | ICD-10-CM | POA: Diagnosis not present

## 2022-06-04 DIAGNOSIS — I890 Lymphedema, not elsewhere classified: Secondary | ICD-10-CM

## 2022-06-04 DIAGNOSIS — L97921 Non-pressure chronic ulcer of unspecified part of left lower leg limited to breakdown of skin: Secondary | ICD-10-CM

## 2022-06-04 DIAGNOSIS — J449 Chronic obstructive pulmonary disease, unspecified: Secondary | ICD-10-CM | POA: Diagnosis not present

## 2022-06-04 DIAGNOSIS — I480 Paroxysmal atrial fibrillation: Secondary | ICD-10-CM | POA: Diagnosis not present

## 2022-06-04 DIAGNOSIS — I341 Nonrheumatic mitral (valve) prolapse: Secondary | ICD-10-CM | POA: Diagnosis not present

## 2022-06-04 DIAGNOSIS — I5033 Acute on chronic diastolic (congestive) heart failure: Secondary | ICD-10-CM | POA: Diagnosis not present

## 2022-06-04 DIAGNOSIS — N1831 Chronic kidney disease, stage 3a: Secondary | ICD-10-CM | POA: Diagnosis not present

## 2022-06-04 DIAGNOSIS — I130 Hypertensive heart and chronic kidney disease with heart failure and stage 1 through stage 4 chronic kidney disease, or unspecified chronic kidney disease: Secondary | ICD-10-CM | POA: Diagnosis not present

## 2022-06-04 DIAGNOSIS — E871 Hypo-osmolality and hyponatremia: Secondary | ICD-10-CM | POA: Diagnosis not present

## 2022-06-04 DIAGNOSIS — I2510 Atherosclerotic heart disease of native coronary artery without angina pectoris: Secondary | ICD-10-CM | POA: Diagnosis not present

## 2022-06-04 DIAGNOSIS — E876 Hypokalemia: Secondary | ICD-10-CM | POA: Diagnosis not present

## 2022-06-04 DIAGNOSIS — Z6841 Body Mass Index (BMI) 40.0 and over, adult: Secondary | ICD-10-CM | POA: Diagnosis not present

## 2022-06-04 DIAGNOSIS — I272 Pulmonary hypertension, unspecified: Secondary | ICD-10-CM | POA: Diagnosis not present

## 2022-06-04 DIAGNOSIS — R001 Bradycardia, unspecified: Secondary | ICD-10-CM | POA: Diagnosis not present

## 2022-06-04 DIAGNOSIS — I9589 Other hypotension: Secondary | ICD-10-CM | POA: Diagnosis not present

## 2022-06-04 DIAGNOSIS — E8881 Metabolic syndrome: Secondary | ICD-10-CM | POA: Diagnosis not present

## 2022-06-04 DIAGNOSIS — E785 Hyperlipidemia, unspecified: Secondary | ICD-10-CM | POA: Diagnosis not present

## 2022-06-04 DIAGNOSIS — I252 Old myocardial infarction: Secondary | ICD-10-CM | POA: Diagnosis not present

## 2022-06-04 DIAGNOSIS — I8393 Asymptomatic varicose veins of bilateral lower extremities: Secondary | ICD-10-CM | POA: Diagnosis not present

## 2022-06-04 DIAGNOSIS — N179 Acute kidney failure, unspecified: Secondary | ICD-10-CM | POA: Diagnosis not present

## 2022-06-04 DIAGNOSIS — D631 Anemia in chronic kidney disease: Secondary | ICD-10-CM | POA: Diagnosis not present

## 2022-06-04 DIAGNOSIS — E1122 Type 2 diabetes mellitus with diabetic chronic kidney disease: Secondary | ICD-10-CM | POA: Diagnosis not present

## 2022-06-04 DIAGNOSIS — I350 Nonrheumatic aortic (valve) stenosis: Secondary | ICD-10-CM | POA: Diagnosis not present

## 2022-06-04 DIAGNOSIS — Z7901 Long term (current) use of anticoagulants: Secondary | ICD-10-CM | POA: Diagnosis not present

## 2022-06-04 NOTE — Progress Notes (Signed)
Office Visit Note   Patient: Ryan Garner           Date of Birth: June 19, 1946           MRN: [id number] Visit Date: 06/04/2022              Requested by: [name], [name], MD [place],  [place] [address] PCP: [name], [name], MD  Chief Complaint  Patient presents with   Left Leg - Routine Post Op    04/06/2022 LLE debridement kerecis graft In office application of approved Kerecis graft 04/25/2022, 04/30/2022, 05/07/22, 05/14/22 with a vac on this date.       HPI: Patient is a 76 year old gentleman who presents 2 months status post left lower extremity debridement and application of Kerecis tissue graft.  Patient has had an office grafting October 4 ninth 16th and 23rd.  Patient is advanced to daily compression dressings from his Dynaflex.  Patient states that he is still awaiting his lymphedema pumps.  Assessment & Plan: Visit Diagnoses:  1. Lymphedema of both lower extremities   2. Nonhealing ulcer of left lower leg limited to breakdown of skin ([place])     Plan: Continue with routine wound care continue with compression start the lymphedema pumps when available.  Follow-Up Instructions: Return in about 4 weeks (around 07/02/2022).   Ortho Exam  Patient is alert, oriented, no adenopathy, well-dressed, normal affect, normal respiratory effort. Examination patient has significant venous and lymphatic insufficiency both legs.  The ulcer continues to improve it is 25 x 55 mm and 10 mm deep.  There is healthy granulation tissue.  Imaging: No results found. No images are attached to the encounter.  Labs: Lab Results  Component Value Date   HGBA1C 7.0 (H) 12/15/2021   HGBA1C 6.4 (H) 04/28/2021   HGBA1C 6.7 10/27/2020   REPTSTATUS 04/11/2022 FINAL 04/04/2022   GRAMSTAIN NO WBC SEEN NO ORGANISMS SEEN  04/04/2022   CULT  04/04/2022    RARE SERRATIA MARCESCENS NO ANAEROBES ISOLATED Performed at [facility], [place] [address], [place], [place] [address]     LABORGA SERRATIA MARCESCENS 04/04/2022     Lab Results  Component Value Date   ALBUMIN 2.7 (L) 04/14/2022   ALBUMIN 2.6 (L) 04/13/2022   ALBUMIN 2.6 (L) 04/12/2022    Lab Results  Component Value Date   MG 1.6 (L) 04/14/2022   MG 1.5 (L) 04/13/2022   MG 1.7 04/11/2022   Lab Results  Component Value Date   VD25OH 36.7 04/14/2020    No results found for: "PREALBUMIN"    Latest Ref Rng & Units 04/14/2022    5:55 AM 04/13/2022    3:34 AM 04/12/2022    8:18 AM  CBC EXTENDED  WBC 4.0 - 10.5 K/uL 10.4  10.4  11.6   RBC 4.22 - 5.81 MIL/uL 3.44  3.44  3.33   Hemoglobin 13.0 - 17.0 g/dL 10.9  10.8  10.4   HCT 39.0 - 52.0 % 31.3  31.5  30.6   Platelets 150 - 400 K/uL 258  230  217   NEUT# 1.7 - 7.7 K/uL 7.1  7.3    Lymph# 0.7 - 4.0 K/uL 1.4  1.3       There is no height or weight on file to calculate BMI.  Orders:  No orders of the defined types were placed in this encounter.  No orders of the defined types were placed in this encounter.    Procedures:  No procedures performed  Clinical Data: No additional findings.  ROS:  All other systems negative, except as noted in the HPI. Review of Systems  Objective: Vital Signs: There were no vitals taken for this visit.  Specialty Comments:  No specialty comments available.  PMFS History: Patient Active Problem List   Diagnosis Date Noted   Nonrheumatic mitral (valve) insufficiency    Aortic valve stenosis, nonrheumatic    Hypotension    Transient hypotension 04/06/2022   Bradycardia 04/06/2022   (HFpEF) heart failure with preserved ejection fraction ([place]) 04/06/2022   Abscess of leg, left 04/04/2022   Cellulitis of left lower extremity 03/02/2022   Chronic diastolic CHF (congestive heart failure) ([place]) 03/02/2022   Cellulitis of left leg 03/02/2022   Foot callus 12/15/2021   Other secondary pulmonary hypertension ([place]) 08/30/2021   COPD (chronic obstructive pulmonary disease) ([place]) 08/30/2021   Coronary  artery disease due to type 2 diabetes mellitus ([place]) 07/21/2020   CHF (congestive heart failure) ([place]) 07/21/2020   History of ST elevation myocardial infarction (STEMI) 04/30/2020   Atrial fibrillation ([place]) 02/29/2020   Acute kidney injury superimposed on chronic kidney disease ([place]) 02/28/2020   Hyponatremia 02/28/2020   Lumbar radiculopathy 03/24/2019   Morbid obesity ([place]) 09/04/2018   Lung nodules 01/17/2017   Ascending aortic aneurysm 01/17/2017   Type 2 diabetes mellitus ([place]) 03/20/2016   Tear of lateral meniscus of right knee 11/19/2014   Hypertension    Hyperlipidemia    Metabolic syndrome    Osteoarthritis of both knees 07/06/2013   Acquired spondylolisthesis 03/06/2013   Degeneration of lumbar intervertebral disc 03/06/2013   Lumbar spondylosis 03/06/2013   Spinal stenosis of lumbar region 03/06/2013   Past Medical History:  Diagnosis Date   A-fib [facility])    Acute on chronic diastolic CHF (congestive heart failure) ([place]) 07/21/2020   Coronary artery disease    Diabetes mellitus without complication (HCC)    Dyspnea    Dysrhythmia    A-fib   Hyperlipidemia    Hypertension    Metabolic syndrome    Prediabetes    Sleep apnea    STEMI (ST elevation myocardial infarction) ([place])     Family History  Problem Relation Age of Onset   Cancer Mother        lungs   Coronary artery disease Mother    Diabetes Brother    Heart attack Brother    Diabetes Maternal Grandmother    Arthritis Father     Past Surgical History:  Procedure Laterality Date   APPENDECTOMY     CHOLECYSTECTOMY N/A 03/02/2020   Procedure: LAPAROSCOPIC CHOLECYSTECTOMY;  Surgeon: Aviva Signs, MD;  Location: [facility];  Service: General;  Laterality: N/A;   CORONARY STENT INTERVENTION N/A 04/30/2020   Procedure: CORONARY STENT INTERVENTION;  Surgeon: Martinique, Peter M, MD;  Location: [facility];  Service: Cardiovascular;  Laterality: N/A;   CORONARY/GRAFT ACUTE MI REVASCULARIZATION N/A 04/30/2020    Procedure: Coronary/Graft Acute MI Revascularization;  Surgeon: Martinique, Peter M, MD;  Location: [facility];  Service: Cardiovascular;  Laterality: N/A;   Eyelid Surgery Bilateral    HERNIA REPAIR     I & D EXTREMITY Left 04/06/2022   Procedure: LEFT LEG DEBRIDEMENT;  Surgeon: Newt Minion, MD;  Location: [facility];  Service: Orthopedics;  Laterality: Left;   I & D EXTREMITY Left 04/04/2022   Procedure: LEFT LEG DEBRIDEMENT;  Surgeon: Newt Minion, MD;  Location: [facility];  Service: Orthopedics;  Laterality: Left;  knee tendons repair     LEFT HEART CATH AND CORONARY ANGIOGRAPHY N/A 04/30/2020   Procedure: LEFT HEART CATH AND CORONARY ANGIOGRAPHY;  Surgeon: Martinique, Peter M, MD;  Location: [facility];  Service: Cardiovascular;  Laterality: N/A;   ROTATOR CUFF REPAIR Bilateral    TONSILLECTOMY     Social History   Occupational History   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

## 2022-06-06 DIAGNOSIS — D631 Anemia in chronic kidney disease: Secondary | ICD-10-CM | POA: Diagnosis not present

## 2022-06-06 DIAGNOSIS — I5033 Acute on chronic diastolic (congestive) heart failure: Secondary | ICD-10-CM | POA: Diagnosis not present

## 2022-06-06 DIAGNOSIS — N179 Acute kidney failure, unspecified: Secondary | ICD-10-CM | POA: Diagnosis not present

## 2022-06-06 DIAGNOSIS — N1831 Chronic kidney disease, stage 3a: Secondary | ICD-10-CM | POA: Diagnosis not present

## 2022-06-06 DIAGNOSIS — E1122 Type 2 diabetes mellitus with diabetic chronic kidney disease: Secondary | ICD-10-CM | POA: Diagnosis not present

## 2022-06-06 DIAGNOSIS — I130 Hypertensive heart and chronic kidney disease with heart failure and stage 1 through stage 4 chronic kidney disease, or unspecified chronic kidney disease: Secondary | ICD-10-CM | POA: Diagnosis not present

## 2022-06-08 DIAGNOSIS — I130 Hypertensive heart and chronic kidney disease with heart failure and stage 1 through stage 4 chronic kidney disease, or unspecified chronic kidney disease: Secondary | ICD-10-CM | POA: Diagnosis not present

## 2022-06-08 DIAGNOSIS — E1122 Type 2 diabetes mellitus with diabetic chronic kidney disease: Secondary | ICD-10-CM | POA: Diagnosis not present

## 2022-06-08 DIAGNOSIS — I5033 Acute on chronic diastolic (congestive) heart failure: Secondary | ICD-10-CM | POA: Diagnosis not present

## 2022-06-08 DIAGNOSIS — N1831 Chronic kidney disease, stage 3a: Secondary | ICD-10-CM | POA: Diagnosis not present

## 2022-06-08 DIAGNOSIS — N179 Acute kidney failure, unspecified: Secondary | ICD-10-CM | POA: Diagnosis not present

## 2022-06-08 DIAGNOSIS — D631 Anemia in chronic kidney disease: Secondary | ICD-10-CM | POA: Diagnosis not present

## 2022-06-15 DIAGNOSIS — I130 Hypertensive heart and chronic kidney disease with heart failure and stage 1 through stage 4 chronic kidney disease, or unspecified chronic kidney disease: Secondary | ICD-10-CM | POA: Diagnosis not present

## 2022-06-15 DIAGNOSIS — N179 Acute kidney failure, unspecified: Secondary | ICD-10-CM | POA: Diagnosis not present

## 2022-06-15 DIAGNOSIS — D631 Anemia in chronic kidney disease: Secondary | ICD-10-CM | POA: Diagnosis not present

## 2022-06-15 DIAGNOSIS — N1831 Chronic kidney disease, stage 3a: Secondary | ICD-10-CM | POA: Diagnosis not present

## 2022-06-15 DIAGNOSIS — I5033 Acute on chronic diastolic (congestive) heart failure: Secondary | ICD-10-CM | POA: Diagnosis not present

## 2022-06-15 DIAGNOSIS — E1122 Type 2 diabetes mellitus with diabetic chronic kidney disease: Secondary | ICD-10-CM | POA: Diagnosis not present

## 2022-06-29 DIAGNOSIS — I130 Hypertensive heart and chronic kidney disease with heart failure and stage 1 through stage 4 chronic kidney disease, or unspecified chronic kidney disease: Secondary | ICD-10-CM | POA: Diagnosis not present

## 2022-06-29 DIAGNOSIS — N1831 Chronic kidney disease, stage 3a: Secondary | ICD-10-CM | POA: Diagnosis not present

## 2022-06-29 DIAGNOSIS — N179 Acute kidney failure, unspecified: Secondary | ICD-10-CM | POA: Diagnosis not present

## 2022-06-29 DIAGNOSIS — E1122 Type 2 diabetes mellitus with diabetic chronic kidney disease: Secondary | ICD-10-CM | POA: Diagnosis not present

## 2022-06-29 DIAGNOSIS — I5033 Acute on chronic diastolic (congestive) heart failure: Secondary | ICD-10-CM | POA: Diagnosis not present

## 2022-06-29 DIAGNOSIS — D631 Anemia in chronic kidney disease: Secondary | ICD-10-CM | POA: Diagnosis not present

## 2022-07-01 DIAGNOSIS — S81802A Unspecified open wound, left lower leg, initial encounter: Secondary | ICD-10-CM | POA: Diagnosis not present

## 2022-07-01 DIAGNOSIS — I4819 Other persistent atrial fibrillation: Secondary | ICD-10-CM | POA: Diagnosis not present

## 2022-07-01 DIAGNOSIS — G4733 Obstructive sleep apnea (adult) (pediatric): Secondary | ICD-10-CM | POA: Diagnosis not present

## 2022-07-01 DIAGNOSIS — N5089 Other specified disorders of the male genital organs: Secondary | ICD-10-CM | POA: Diagnosis not present

## 2022-07-01 DIAGNOSIS — Z7901 Long term (current) use of anticoagulants: Secondary | ICD-10-CM | POA: Diagnosis not present

## 2022-07-01 DIAGNOSIS — R0902 Hypoxemia: Secondary | ICD-10-CM | POA: Diagnosis not present

## 2022-07-01 DIAGNOSIS — Z9989 Dependence on other enabling machines and devices: Secondary | ICD-10-CM | POA: Diagnosis not present

## 2022-07-01 DIAGNOSIS — R000 Tachycardia, unspecified: Secondary | ICD-10-CM | POA: Diagnosis not present

## 2022-07-01 DIAGNOSIS — I509 Heart failure, unspecified: Secondary | ICD-10-CM | POA: Insufficient documentation

## 2022-07-01 DIAGNOSIS — N179 Acute kidney failure, unspecified: Secondary | ICD-10-CM | POA: Diagnosis not present

## 2022-07-01 DIAGNOSIS — Z6841 Body Mass Index (BMI) 40.0 and over, adult: Secondary | ICD-10-CM | POA: Diagnosis not present

## 2022-07-01 DIAGNOSIS — I50813 Acute on chronic right heart failure: Secondary | ICD-10-CM | POA: Diagnosis not present

## 2022-07-01 DIAGNOSIS — I4821 Permanent atrial fibrillation: Secondary | ICD-10-CM | POA: Diagnosis not present

## 2022-07-01 DIAGNOSIS — I4891 Unspecified atrial fibrillation: Secondary | ICD-10-CM | POA: Diagnosis not present

## 2022-07-01 DIAGNOSIS — Z8679 Personal history of other diseases of the circulatory system: Secondary | ICD-10-CM | POA: Diagnosis not present

## 2022-07-01 DIAGNOSIS — J90 Pleural effusion, not elsewhere classified: Secondary | ICD-10-CM | POA: Diagnosis not present

## 2022-07-01 DIAGNOSIS — I272 Pulmonary hypertension, unspecified: Secondary | ICD-10-CM | POA: Diagnosis not present

## 2022-07-01 DIAGNOSIS — R601 Generalized edema: Secondary | ICD-10-CM | POA: Diagnosis not present

## 2022-07-01 DIAGNOSIS — Z79899 Other long term (current) drug therapy: Secondary | ICD-10-CM | POA: Diagnosis not present

## 2022-07-01 DIAGNOSIS — R600 Localized edema: Secondary | ICD-10-CM | POA: Diagnosis not present

## 2022-07-01 DIAGNOSIS — Z7902 Long term (current) use of antithrombotics/antiplatelets: Secondary | ICD-10-CM | POA: Diagnosis not present

## 2022-07-01 DIAGNOSIS — Z91040 Latex allergy status: Secondary | ICD-10-CM | POA: Diagnosis not present

## 2022-07-01 DIAGNOSIS — L97929 Non-pressure chronic ulcer of unspecified part of left lower leg with unspecified severity: Secondary | ICD-10-CM | POA: Diagnosis not present

## 2022-07-01 DIAGNOSIS — I252 Old myocardial infarction: Secondary | ICD-10-CM | POA: Diagnosis not present

## 2022-07-01 DIAGNOSIS — Z0000 Encounter for general adult medical examination without abnormal findings: Secondary | ICD-10-CM | POA: Diagnosis not present

## 2022-07-01 DIAGNOSIS — Z888 Allergy status to other drugs, medicaments and biological substances status: Secondary | ICD-10-CM | POA: Diagnosis not present

## 2022-07-01 DIAGNOSIS — I2510 Atherosclerotic heart disease of native coronary artery without angina pectoris: Secondary | ICD-10-CM | POA: Diagnosis not present

## 2022-07-01 DIAGNOSIS — I890 Lymphedema, not elsewhere classified: Secondary | ICD-10-CM | POA: Diagnosis not present

## 2022-07-01 DIAGNOSIS — R0689 Other abnormalities of breathing: Secondary | ICD-10-CM | POA: Diagnosis not present

## 2022-07-01 DIAGNOSIS — Z9981 Dependence on supplemental oxygen: Secondary | ICD-10-CM | POA: Diagnosis not present

## 2022-07-01 DIAGNOSIS — I5043 Acute on chronic combined systolic (congestive) and diastolic (congestive) heart failure: Secondary | ICD-10-CM | POA: Diagnosis not present

## 2022-07-01 DIAGNOSIS — M7989 Other specified soft tissue disorders: Secondary | ICD-10-CM | POA: Diagnosis not present

## 2022-07-01 DIAGNOSIS — R0602 Shortness of breath: Secondary | ICD-10-CM | POA: Diagnosis not present

## 2022-07-01 DIAGNOSIS — E876 Hypokalemia: Secondary | ICD-10-CM | POA: Diagnosis not present

## 2022-07-01 DIAGNOSIS — I081 Rheumatic disorders of both mitral and tricuspid valves: Secondary | ICD-10-CM | POA: Diagnosis not present

## 2022-07-02 ENCOUNTER — Encounter: Payer: Medicare Other | Admitting: Orthopedic Surgery

## 2022-07-02 ENCOUNTER — Other Ambulatory Visit: Payer: Self-pay

## 2022-07-02 ENCOUNTER — Ambulatory Visit: Payer: Medicare Other | Admitting: Internal Medicine

## 2022-07-02 DIAGNOSIS — I890 Lymphedema, not elsewhere classified: Secondary | ICD-10-CM | POA: Insufficient documentation

## 2022-07-02 DIAGNOSIS — G4733 Obstructive sleep apnea (adult) (pediatric): Secondary | ICD-10-CM | POA: Insufficient documentation

## 2022-07-02 DIAGNOSIS — I509 Heart failure, unspecified: Secondary | ICD-10-CM | POA: Diagnosis not present

## 2022-07-02 MED ORDER — FUROSEMIDE 80 MG PO TABS: 80.0000 mg | ORAL_TABLET | Freq: Two times a day (BID) | ORAL | 3 refills | Status: DC

## 2022-07-02 NOTE — Telephone Encounter (Signed)
Pt's medication was sent to pt's pharmacy as requested. Confirmation received.  °

## 2022-07-03 ENCOUNTER — Ambulatory Visit: Payer: Medicare Other | Admitting: Internal Medicine

## 2022-07-04 ENCOUNTER — Ambulatory Visit (INDEPENDENT_AMBULATORY_CARE_PROVIDER_SITE_OTHER): Payer: Medicare Other

## 2022-07-04 VITALS — Ht 68.0 in | Wt 293.0 lb

## 2022-07-04 DIAGNOSIS — Z0000 Encounter for general adult medical examination without abnormal findings: Secondary | ICD-10-CM

## 2022-07-04 NOTE — Patient Instructions (Signed)
[name] , Thank you for taking time to come for your Medicare Wellness Visit. I appreciate your ongoing commitment to your health goals. Please review the following plan we discussed and let me know if I can assist you in the future.   These are the goals we discussed:  Goals      DIET - INCREASE WATER INTAKE     Try to drink 6-8 glasses of water daily.     DIET - REDUCE SODIUM INTAKE     Exercise 150 min/wk Moderate Activity        This is a list of the screening recommended for you and due dates:  Health Maintenance  Topic Date Due   COVID-19 Vaccine (1) Never done   Eye exam for diabetics  Never done   Zoster (Shingles) Vaccine (1 of 2) Never done   Yearly kidney health urinalysis for diabetes  10/27/2021   Hemoglobin A1C  06/17/2022   Flu Shot  10/21/2022*   Colon Cancer Screening  12/16/2022*   Complete foot exam   12/16/2022   Yearly kidney function blood test for diabetes  04/15/2023   Medicare Annual Wellness Visit  07/05/2023   DTaP/Tdap/Td vaccine (7 - Td or Tdap) 07/30/2026   Pneumonia Vaccine  Completed   Hepatitis C Screening: USPSTF Recommendation to screen - Ages 83-79 yo.  Completed   HPV Vaccine  Aged Out  *Topic was postponed. The date shown is not the original due date.    Advanced directives: Please bring a copy of your health care power of attorney and living will to the office to be added to your chart at your convenience.   Conditions/risks identified: Aim for 30 minutes of exercise or brisk walking, 6-8 glasses of water, and 5 servings of fruits and vegetables each day.   Next appointment: Follow up in one year for your annual wellness visit.   Preventive Care 4 Years and Older, Male  Preventive care refers to lifestyle choices and visits with your health care provider that can promote health and wellness. What does preventive care include? A yearly physical exam. This is also called an annual well check. Dental exams once or twice a  year. Routine eye exams. Ask your health care provider how often you should have your eyes checked. Personal lifestyle choices, including: Daily care of your teeth and gums. Regular physical activity. Eating a healthy diet. Avoiding tobacco and drug use. Limiting alcohol use. Practicing safe sex. Taking low doses of aspirin every day. Taking vitamin and mineral supplements as recommended by your health care provider. What happens during an annual well check? The services and screenings done by your health care provider during your annual well check will depend on your age, overall health, lifestyle risk factors, and family history of disease. Counseling  Your health care provider may ask you questions about your: Alcohol use. Tobacco use. Drug use. Emotional well-being. Home and relationship well-being. Sexual activity. Eating habits. History of falls. Memory and ability to understand (cognition). Work and work Statistician. Screening  You may have the following tests or measurements: Height, weight, and BMI. Blood pressure. Lipid and cholesterol levels. These may be checked every 5 years, or more frequently if you are over 82 years old. Skin check. Lung cancer screening. You may have this screening every year starting at age 62 if you have a 30-pack-year history of smoking and currently smoke or have quit within the past 15 years. Fecal occult blood test (FOBT) of the stool.  You may have this test every year starting at age 18. Flexible sigmoidoscopy or colonoscopy. You may have a sigmoidoscopy every 5 years or a colonoscopy every 10 years starting at age 39. Prostate cancer screening. Recommendations will vary depending on your family history and other risks. Hepatitis C blood test. Hepatitis B blood test. Sexually transmitted disease (STD) testing. Diabetes screening. This is done by checking your blood sugar (glucose) after you have not eaten for a while (fasting). You may  have this done every 1-3 years. Abdominal aortic aneurysm (AAA) screening. You may need this if you are a current or former smoker. Osteoporosis. You may be screened starting at age 26 if you are at high risk. Talk with your health care provider about your test results, treatment options, and if necessary, the need for more tests. Vaccines  Your health care provider may recommend certain vaccines, such as: Influenza vaccine. This is recommended every year. Tetanus, diphtheria, and acellular pertussis (Tdap, Td) vaccine. You may need a Td booster every 10 years. Zoster vaccine. You may need this after age 76. Pneumococcal 13-valent conjugate (PCV13) vaccine. One dose is recommended after age 85. Pneumococcal polysaccharide (PPSV23) vaccine. One dose is recommended after age 76. Talk to your health care provider about which screenings and vaccines you need and how often you need them. This information is not intended to replace advice given to you by your health care provider. Make sure you discuss any questions you have with your health care provider. Document Released: 08/05/2015 Document Revised: 03/28/2016 Document Reviewed: 05/10/2015 Elsevier Interactive Patient Education  2017 [place] Prevention in the Home Falls can cause injuries. They can happen to people of all ages. There are many things you can do to make your home safe and to help prevent falls. What can I do on the outside of my home? Regularly fix the edges of walkways and driveways and fix any cracks. Remove anything that might make you trip as you walk through a door, such as a raised step or threshold. Trim any bushes or trees on the path to your home. Use bright outdoor lighting. Clear any walking paths of anything that might make someone trip, such as rocks or tools. Regularly check to see if handrails are loose or broken. Make sure that both sides of any steps have handrails. Any raised decks and porches  should have guardrails on the edges. Have any leaves, snow, or ice cleared regularly. Use sand or salt on walking paths during winter. Clean up any spills in your garage right away. This includes oil or grease spills. What can I do in the bathroom? Use night lights. Install grab bars by the toilet and in the tub and shower. Do not use towel bars as grab bars. Use non-skid mats or decals in the tub or shower. If you need to sit down in the shower, use a plastic, non-slip stool. Keep the floor dry. Clean up any water that spills on the floor as soon as it happens. Remove soap buildup in the tub or shower regularly. Attach bath mats securely with double-sided non-slip rug tape. Do not have throw rugs and other things on the floor that can make you trip. What can I do in the bedroom? Use night lights. Make sure that you have a light by your bed that is easy to reach. Do not use any sheets or blankets that are too big for your bed. They should not hang down onto the floor. Have  a firm chair that has side arms. You can use this for support while you get dressed. Do not have throw rugs and other things on the floor that can make you trip. What can I do in the kitchen? Clean up any spills right away. Avoid walking on wet floors. Keep items that you use a lot in easy-to-reach places. If you need to reach something above you, use a strong step stool that has a grab bar. Keep electrical cords out of the way. Do not use floor polish or wax that makes floors slippery. If you must use wax, use non-skid floor wax. Do not have throw rugs and other things on the floor that can make you trip. What can I do with my stairs? Do not leave any items on the stairs. Make sure that there are handrails on both sides of the stairs and use them. Fix handrails that are broken or loose. Make sure that handrails are as long as the stairways. Check any carpeting to make sure that it is firmly attached to the stairs.  Fix any carpet that is loose or worn. Avoid having throw rugs at the top or bottom of the stairs. If you do have throw rugs, attach them to the floor with carpet tape. Make sure that you have a light switch at the top of the stairs and the bottom of the stairs. If you do not have them, ask someone to add them for you. What else can I do to help prevent falls? Wear shoes that: Do not have high heels. Have rubber bottoms. Are comfortable and fit you well. Are closed at the toe. Do not wear sandals. If you use a stepladder: Make sure that it is fully opened. Do not climb a closed stepladder. Make sure that both sides of the stepladder are locked into place. Ask someone to hold it for you, if possible. Clearly mark and make sure that you can see: Any grab bars or handrails. First and last steps. Where the edge of each step is. Use tools that help you move around (mobility aids) if they are needed. These include: Canes. Walkers. Scooters. Crutches. Turn on the lights when you go into a dark area. Replace any light bulbs as soon as they burn out. Set up your furniture so you have a clear path. Avoid moving your furniture around. If any of your floors are uneven, fix them. If there are any pets around you, be aware of where they are. Review your medicines with your doctor. Some medicines can make you feel dizzy. This can increase your chance of falling. Ask your doctor what other things that you can do to help prevent falls. This information is not intended to replace advice given to you by your health care provider. Make sure you discuss any questions you have with your health care provider. Document Released: 05/05/2009 Document Revised: 12/15/2015 Document Reviewed: 08/13/2014 Elsevier Interactive Patient Education  2017 [employer].

## 2022-07-04 NOTE — Progress Notes (Signed)
Subjective:   Ryan Garner is a 76 y.o. male who presents for Medicare Annual/Subsequent preventive examination. I connected with  [name] on 07/04/22 by a audio enabled telemedicine application and verified that I am speaking with the correct person using two identifiers.  Patient Location: Home  Provider Location: Home Office  I discussed the limitations of evaluation and management by telemedicine. The patient expressed understanding and agreed to proceed.  Review of Systems     Cardiac Risk Factors include: advanced age (>27mn, >>19women);dyslipidemia;hypertension;male gender     Objective:    Today's Vitals   07/04/22 1319  Weight: 293 lb (132.9 kg)  Height: _0  (1.727 m)   Body mass index is 44.55 kg/m.     07/04/2022    1:23 PM 04/04/2022    8:05 AM 03/01/2022    9:55 PM 09/18/2021   10:31 PM 07/03/2021    1:20 PM 07/21/2020    2:00 PM 06/30/2020    2:31 PM  Advanced Directives  Does Patient Have a Medical Advance Directive? Yes No No Yes Yes No No  Type of [place]   Living will;Healthcare Power of [place] will    Does patient want to make changes to medical advance directive?    No - Patient declined     Copy of HYznagain Chart? No - copy requested   Yes - validated most recent copy scanned in chart (See row information) No - copy requested    Would patient like information on creating a medical advance directive?  No - Patient declined No - Patient declined   Yes (ED - Information included in AVS) No - Patient declined    Current Medications (verified) Outpatient Encounter Medications as of 07/04/2022  Medication Sig   apixaban (ELIQUIS) 5 MG TABS tablet Take 1 tablet (5 mg total) by mouth 2 (two) times daily.   ascorbic acid (VITAMIN C) 1000 MG tablet Take 1 tablet (1,000 mg total) by mouth daily.   atorvastatin (LIPITOR) 80 MG tablet Take 1  tablet by mouth once daily   clopidogrel (PLAVIX) 75 MG tablet Take 1 tablet (75 mg total) by mouth daily with breakfast.   cyanocobalamin (VITAMIN B12) 1000 MCG tablet Take 1,000 mcg by mouth daily.   doxycycline (VIBRA-TABS) 100 MG tablet TAKE 1 TABLET BY MOUTH EVERY 12 HOURS   furosemide (LASIX) 80 MG tablet Take 1 tablet (80 mg total) by mouth 2 (two) times daily.   levofloxacin (LEVAQUIN) 750 MG tablet TAKE 1 TABLET BY MOUTH AT BEDTIME   Multiple Vitamin (MULTIVITAMIN WITH MINERALS) TABS tablet Take 1 tablet by mouth daily.   nitroGLYCERIN (NITROSTAT) 0.4 MG SL tablet Place 1 tablet (0.4 mg total) under the tongue every 5 (five) minutes x 3 doses as needed for chest pain.   nutrition supplement, JUVEN, (JUVEN) PACK Take 1 packet by mouth 2 (two) times daily between meals.   potassium chloride SA (KLOR-CON M) 20 MEQ tablet Take 2 tablets (40 mEq total) by mouth daily.   Tiotropium Bromide Monohydrate (SPIRIVA RESPIMAT) 1.25 MCG/ACT AERS Inhale 2 puffs into the lungs daily.   zinc sulfate 220 (50 Zn) MG capsule Take 1 capsule (220 mg total) by mouth daily.   metoprolol tartrate (LOPRESSOR) 25 MG tablet Take 1 tablet (25 mg total) by mouth 2 (two) times daily.   [DISCONTINUED] Aquagard Hydrating 41 % OINT    No facility-administered encounter medications  on file as of 07/04/2022.    Allergies (verified) Flecainide and Rofecoxib   History: Past Medical History:  Diagnosis Date   A-fib ([place])    Acute on chronic diastolic CHF (congestive heart failure) ([place]) 07/21/2020   Coronary artery disease    Diabetes mellitus without complication (HCC)    Dyspnea    Dysrhythmia    A-fib   Hyperlipidemia    Hypertension    Metabolic syndrome    Prediabetes    Sleep apnea    STEMI (ST elevation myocardial infarction) [facility])    Past Surgical History:  Procedure Laterality Date   APPENDECTOMY     CHOLECYSTECTOMY N/A 03/02/2020   Procedure: LAPAROSCOPIC CHOLECYSTECTOMY;  Surgeon: Aviva Signs, MD;  Location: [facility];  Service: General;  Laterality: N/A;   CORONARY STENT INTERVENTION N/A 04/30/2020   Procedure: CORONARY STENT INTERVENTION;  Surgeon: Martinique, Peter M, MD;  Location: [place] [facility];  Service: Cardiovascular;  Laterality: N/A;   CORONARY/GRAFT ACUTE MI REVASCULARIZATION N/A 04/30/2020   Procedure: Coronary/Graft Acute MI Revascularization;  Surgeon: Martinique, Peter M, MD;  Location: [place] [facility];  Service: Cardiovascular;  Laterality: N/A;   Eyelid Surgery Bilateral    HERNIA REPAIR     I & D EXTREMITY Left 04/06/2022   Procedure: LEFT LEG DEBRIDEMENT;  Surgeon: Newt Minion, MD;  Location: [place];  Service: Orthopedics;  Laterality: Left;   I & D EXTREMITY Left 04/04/2022   Procedure: LEFT LEG DEBRIDEMENT;  Surgeon: Newt Minion, MD;  Location: [place];  Service: Orthopedics;  Laterality: Left;   knee tendons repair     LEFT HEART CATH AND CORONARY ANGIOGRAPHY N/A 04/30/2020   Procedure: LEFT HEART CATH AND CORONARY ANGIOGRAPHY;  Surgeon: Martinique, Peter M, MD;  Location: [place] [facility];  Service: Cardiovascular;  Laterality: N/A;   ROTATOR CUFF REPAIR Bilateral    TONSILLECTOMY     Family History  Problem Relation Age of Onset   Cancer Mother        lungs   Coronary artery disease Mother    Diabetes Brother    Heart attack Brother    Diabetes Maternal Grandmother    Arthritis Father    Social History   Socioeconomic History   Marital status: Married    Spouse name: [name]   Number of children: 2   Years of education: 20   Highest education level: Doctorate  Occupational History   Occupation: Theme park manager    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  Tobacco Use   Smoking status: Former    Types: Cigarettes    Quit date: 04/02/1959    Years since quitting: 63.2   Smokeless tobacco: Never  Vaping Use   Vaping Use: Never used  Substance and Sexual Activity   Alcohol use: No   Drug use: No   Sexual  activity: Yes  Other Topics Concern   Not on file  Social History Narrative   Not on file   Social Determinants of Health   Financial Resource Strain: Low Risk  (07/04/2022)   Overall Financial Resource Strain (CARDIA)    Difficulty of Paying Living Expenses: Not hard at all  Food Insecurity: No Food Insecurity (07/04/2022)   Hunger Vital Sign    Worried About Running Out of Food in the Last Year: Never true    [place] in the Last Year: Never true  Transportation Needs: No Transportation Needs (  07/04/2022)   PRAPARE - Hydrologist (Medical): No    Lack of Transportation (Non-Medical): No  Physical Activity: Insufficiently Active (07/04/2022)   Exercise Vital Sign    Days of Exercise per Week: 3 days    Minutes of Exercise per Session: 30 min  Stress: No Stress Concern Present (07/04/2022)   [place]    Feeling of Stress : Not at all  Social Connections: [place] (07/04/2022)   Social Connection and Isolation Panel [NHANES]    Frequency of Communication with Friends and Family: More than three times a week    Frequency of Social Gatherings with Friends and Family: More than three times a week    Attends Religious Services: More than 4 times per year    Active Member of [employer] or Organizations: Yes    Attends Music therapist: More than 4 times per year    Marital Status: Married    Tobacco Counseling Counseling given: Not Answered   Clinical Intake:  Pre-visit preparation completed: Yes  Pain : No/denies pain     Nutritional Risks: None Diabetes: No  How often do you need to have someone help you when you read instructions, pamphlets, or other written materials from your doctor or pharmacy?: 1 - Never  Diabetic?no     Interpreter Needed?: No  Information entered by :: Jadene Pierini, LPN   Activities of Daily Living    07/04/2022     1:23 PM 04/04/2022    8:09 AM  In your present state of health, do you have any difficulty performing the following activities:  Hearing? 0 0  Vision? 0 0  Difficulty concentrating or making decisions? 0 0  Walking or climbing stairs? 0 1  Comment  gets SOB  Dressing or bathing? 0 0  Doing errands, shopping? 0   Preparing Food and eating ? N   Using the Toilet? N   In the past six months, have you accidently leaked urine? N   Do you have problems with loss of bowel control? N   Managing your Medications? N   Managing your Finances? N   Housekeeping or managing your Housekeeping? N     Patient Care Team: [name], [name], MD as PCP - General (Family Medicine) Fay Records, MD as PCP - Cardiology (Cardiology) Shea Evans Norva Riffle, LCSW as [place] Management (Licensed Clinical Social Worker)  Indicate any recent [place] you may have received from other than [facility] providers in the past year (date may be approximate).     Assessment:   This is a routine wellness examination for [place].  Hearing/Vision screen Vision Screening - Comments:: Wears rx glasses - up to date with routine eye exams with  Dr.Johnson   Dietary issues and exercise activities discussed:     Goals Addressed   None    Depression Screen    07/04/2022    1:21 PM 05/03/2022   11:33 AM 03/15/2022   10:36 AM 03/08/2022    3:58 PM 01/25/2022    1:32 PM 12/15/2021   12:57 PM 11/02/2021   11:59 AM  PHQ 2/9 Scores  PHQ - 2 Score 0 0 0 0 0 0 0  PHQ- 9 Score  0 0        Fall Risk    07/04/2022    1:20 PM 05/03/2022   11:32 AM 03/15/2022   10:36 AM 03/08/2022  3:58 PM 01/25/2022    1:32 PM  Fall Risk   Falls in the past year? _0 Number falls in past yr: 1 0 0 0 0  Injury with Fall? 1 0 0 0 0  Risk for fall due to : History of fall(s);Impaired balance/gait;Orthopedic patient Impaired balance/gait;Impaired mobility History of fall(s) Impaired balance/gait Impaired  mobility  Follow up Education provided;Falls prevention discussed Falls evaluation completed Education provided Falls evaluation completed     FALL RISK PREVENTION PERTAINING TO THE HOME:  Any stairs in or around the home? Yes  If so, are there any without handrails? No  Home free of loose throw rugs in walkways, pet beds, electrical cords, etc? Yes  Adequate lighting in your home to reduce risk of falls? Yes   ASSISTIVE DEVICES UTILIZED TO PREVENT FALLS:  Life alert? No  Use of a cane, walker or w/c? Yes  Grab bars in the bathroom? Yes  Shower chair or bench in shower? Yes  Elevated toilet seat or a handicapped toilet? Yes       10/23/2017    3:25 PM  MMSE - Mini Mental State Exam  Orientation to time 5  Orientation to Place 5  Registration 3  Attention/ Calculation 5  Recall 3  Language- name 2 objects 2  Language- repeat 1  Language- follow 3 step command 3  Language- read & follow direction 1  Write a sentence 1  Copy design 0  Total score 29        07/04/2022    1:23 PM 06/30/2020    1:44 PM 04/02/2019    1:22 PM  6CIT Screen  What Year? 0 points 0 points 0 points  What month? 0 points 0 points 0 points  What time? 0 points 0 points 0 points  Count back from 20 0 points 0 points 0 points  Months in reverse 0 points 0 points 0 points  Repeat phrase 0 points 0 points 0 points  Total Score 0 points 0 points 0 points    Immunizations Immunization History  Administered Date(s) Administered   Hepatitis A 08/08/1995, 10/28/1996   Hepatitis B 12/09/1998, 08/04/1999, 08/28/2002   Pneumococcal Conjugate-13 08/26/2014   Pneumococcal Polysaccharide-23 02/04/2012, 08/07/2013   Smallpox 11/27/1967   Td 11/27/1967, 08/08/1993, 07/30/2003, 07/30/2016   Tdap 07/30/2016   Tetanus 07/23/2006   Typhoid Inactivated 08/04/1999, 08/28/2002, 08/15/2009   Yellow Fever 10/05/2013    TDAP status: Up to date  Flu Vaccine status: Up to date  Pneumococcal vaccine status: Up  to date  Covid-19 vaccine status: Completed vaccines  Qualifies for Shingles Vaccine? Yes   Zostavax completed No   Shingrix Completed?: No.    Education has been provided regarding the importance of this vaccine. Patient has been advised to call insurance company to determine out of pocket expense if they have not yet received this vaccine. Advised may also receive vaccine at local pharmacy or Health Dept. Verbalized acceptance and understanding.  Screening Tests Health Maintenance  Topic Date Due   COVID-19 Vaccine (1) Never done   OPHTHALMOLOGY EXAM  Never done   Zoster Vaccines- Shingrix (1 of 2) Never done   Diabetic kidney evaluation - Urine ACR  10/27/2021   HEMOGLOBIN A1C  06/17/2022   INFLUENZA VACCINE  10/21/2022 (Originally 02/20/2022)   COLONOSCOPY (Pts 45-58yr Insurance coverage will need to be confirmed)  12/16/2022 (Originally 08/26/2014)   FOOT EXAM  12/16/2022   Diabetic kidney evaluation - eGFR measurement  04/15/2023   Medicare Annual Wellness (AWV)  07/05/2023   DTaP/Tdap/Td (7 - Td or Tdap) 07/30/2026   Pneumonia Vaccine 77+ Years old  Completed   Hepatitis C Screening  Completed   HPV VACCINES  Aged Out    Health Maintenance  Health Maintenance Due  Topic Date Due   COVID-19 Vaccine (1) Never done   OPHTHALMOLOGY EXAM  Never done   Zoster Vaccines- Shingrix (1 of 2) Never done   Diabetic kidney evaluation - Urine ACR  10/27/2021   HEMOGLOBIN A1C  06/17/2022    Colorectal cancer screening: No longer required.   Lung Cancer Screening: (Low Dose CT Chest recommended if Age 42-80 years, 30 pack-year currently smoking OR have quit w/in 15years.) does not qualify.   Lung Cancer Screening Referral: n/a  Additional Screening:  Hepatitis C Screening: does not qualify; Completed 08/07/2013  Vision Screening: Recommended annual ophthalmology exams for early detection of glaucoma and other disorders of the eye. Is the patient up to date with their annual eye  exam?  Yes  Who is the provider or what is the name of the office in which the patient attends annual eye exams? Dr.Johnson  If pt is not established with a provider, would they like to be referred to a provider to establish care? No .   Dental Screening: Recommended annual dental exams for proper oral hygiene  Community Resource Referral / Chronic Care Management: CRR required this visit?  No   CCM required this visit?  No      Plan:     I have personally reviewed and noted the following in the patient's chart:   Medical and social history Use of alcohol, tobacco or illicit drugs  Current medications and supplements including opioid prescriptions. Patient is not currently taking opioid prescriptions. Functional ability and status Nutritional status Physical activity Advanced directives List of other physicians Hospitalizations, surgeries, and ER visits in previous 12 months Vitals Screenings to include cognitive, depression, and falls Referrals and appointments  In addition, I have reviewed and discussed with patient certain preventive protocols, quality metrics, and best practice recommendations. A written personalized care plan for preventive services as well as general preventive health recommendations were provided to patient.     Daphane Shepherd, LPN   [id number]   Nurse Notes: none

## 2022-07-05 ENCOUNTER — Telehealth: Payer: Self-pay

## 2022-07-05 NOTE — Patient Outreach (Signed)
  Care Coordination TOC Note Transition Care Management Follow-up Telephone Call Date of discharge and from where: 07/04/22-[facility] Dx: "testicle pain,HF" How have you been since you were released from the hospital? Spoke briefly with patient.He did not wish to speak at length. States he was resting.Denies any acute issues or concerns at present. Offered to call back at later time and he declined stating he had PCP appt on Monday. Any questions or concerns? No  Items Reviewed: Did the pt receive and understand the discharge instructions provided? Yes  Medications obtained and verified?  Patient declined Other? No  Any new allergies since your discharge? No  Dietary orders reviewed? No Do you have support at home? Yes     Follow up appointments reviewed:  PCP Hospital f/u appt confirmed? Yes  Scheduled to see Dr.[name] on 07/09/22 @ 1:55pm. [facility] f/u appt confirmed?  N/A . Are transportation arrangements needed? No  If their condition worsens, is the pt aware to call PCP or go to the Emergency Dept.? Yes Was the patient provided with contact information for the PCP's office or ED? Yes Was to pt encouraged to call back with questions or concerns? Yes  SDOH assessments and interventions completed:   No   Care Coordination Interventions:  Education provided    Encounter Outcome:  Pt. Visit Completed    Enzo Montgomery, RN,BSN,CCM [place] [facility] Telephonic Care Management Coordinator Direct Phone: [phone] Toll Free: [phone] Fax: [phone]

## 2022-07-09 ENCOUNTER — Inpatient Hospital Stay: Payer: Medicare Other | Admitting: Family Medicine

## 2022-07-10 DIAGNOSIS — E8881 Metabolic syndrome: Secondary | ICD-10-CM | POA: Diagnosis not present

## 2022-07-10 DIAGNOSIS — G4733 Obstructive sleep apnea (adult) (pediatric): Secondary | ICD-10-CM | POA: Diagnosis not present

## 2022-07-10 DIAGNOSIS — J449 Chronic obstructive pulmonary disease, unspecified: Secondary | ICD-10-CM | POA: Diagnosis not present

## 2022-07-10 DIAGNOSIS — E876 Hypokalemia: Secondary | ICD-10-CM | POA: Diagnosis not present

## 2022-07-10 DIAGNOSIS — Z7901 Long term (current) use of anticoagulants: Secondary | ICD-10-CM | POA: Diagnosis not present

## 2022-07-10 DIAGNOSIS — N179 Acute kidney failure, unspecified: Secondary | ICD-10-CM | POA: Diagnosis not present

## 2022-07-10 DIAGNOSIS — N5089 Other specified disorders of the male genital organs: Secondary | ICD-10-CM | POA: Diagnosis not present

## 2022-07-10 DIAGNOSIS — I2510 Atherosclerotic heart disease of native coronary artery without angina pectoris: Secondary | ICD-10-CM | POA: Diagnosis not present

## 2022-07-10 DIAGNOSIS — I7121 Aneurysm of the ascending aorta, without rupture: Secondary | ICD-10-CM | POA: Diagnosis not present

## 2022-07-10 DIAGNOSIS — E782 Mixed hyperlipidemia: Secondary | ICD-10-CM | POA: Diagnosis not present

## 2022-07-10 DIAGNOSIS — I4819 Other persistent atrial fibrillation: Secondary | ICD-10-CM | POA: Diagnosis not present

## 2022-07-10 DIAGNOSIS — I272 Pulmonary hypertension, unspecified: Secondary | ICD-10-CM | POA: Diagnosis not present

## 2022-07-10 DIAGNOSIS — L97819 Non-pressure chronic ulcer of other part of right lower leg with unspecified severity: Secondary | ICD-10-CM | POA: Diagnosis not present

## 2022-07-10 DIAGNOSIS — I080 Rheumatic disorders of both mitral and aortic valves: Secondary | ICD-10-CM | POA: Diagnosis not present

## 2022-07-10 DIAGNOSIS — Z6841 Body Mass Index (BMI) 40.0 and over, adult: Secondary | ICD-10-CM | POA: Diagnosis not present

## 2022-07-10 DIAGNOSIS — I890 Lymphedema, not elsewhere classified: Secondary | ICD-10-CM | POA: Diagnosis not present

## 2022-07-10 DIAGNOSIS — E119 Type 2 diabetes mellitus without complications: Secondary | ICD-10-CM | POA: Diagnosis not present

## 2022-07-10 DIAGNOSIS — I5023 Acute on chronic systolic (congestive) heart failure: Secondary | ICD-10-CM | POA: Diagnosis not present

## 2022-07-11 ENCOUNTER — Encounter: Payer: Medicare Other | Admitting: Family

## 2022-07-13 ENCOUNTER — Ambulatory Visit (INDEPENDENT_AMBULATORY_CARE_PROVIDER_SITE_OTHER): Payer: Medicare Other | Admitting: Family Medicine

## 2022-07-13 ENCOUNTER — Encounter: Payer: Self-pay | Admitting: Family Medicine

## 2022-07-13 VITALS — BP 138/78 | Temp 98.7°F | Ht 68.0 in | Wt 320.0 lb

## 2022-07-13 DIAGNOSIS — I272 Pulmonary hypertension, unspecified: Secondary | ICD-10-CM

## 2022-07-13 DIAGNOSIS — G4733 Obstructive sleep apnea (adult) (pediatric): Secondary | ICD-10-CM

## 2022-07-13 DIAGNOSIS — N179 Acute kidney failure, unspecified: Secondary | ICD-10-CM | POA: Diagnosis not present

## 2022-07-13 DIAGNOSIS — R601 Generalized edema: Secondary | ICD-10-CM

## 2022-07-13 DIAGNOSIS — I890 Lymphedema, not elsewhere classified: Secondary | ICD-10-CM

## 2022-07-13 DIAGNOSIS — I4821 Permanent atrial fibrillation: Secondary | ICD-10-CM | POA: Diagnosis not present

## 2022-07-13 DIAGNOSIS — E876 Hypokalemia: Secondary | ICD-10-CM

## 2022-07-13 DIAGNOSIS — B372 Candidiasis of skin and nail: Secondary | ICD-10-CM | POA: Diagnosis not present

## 2022-07-13 DIAGNOSIS — I5043 Acute on chronic combined systolic (congestive) and diastolic (congestive) heart failure: Secondary | ICD-10-CM

## 2022-07-13 MED ORDER — NYSTATIN 100000 UNIT/GM EX POWD: 1.0000 | Freq: Three times a day (TID) | CUTANEOUS | 0 refills | Status: DC

## 2022-07-13 NOTE — Patient Instructions (Signed)
Heart Failure Exacerbation  Heart failure is a condition in which the heart has trouble pumping blood. This may mean that the heart cannot pump enough blood out to the body or that the heart does not fill up with enough blood. When this happens, parts of the body do not get the blood and oxygen they need to function properly. This can cause symptoms such as breathing problems, tiredness (fatigue), swelling, and confusion. Heart failure exacerbation refers to heart failure symptoms that get worse. The symptoms may get worse suddenly or develop slowly over time. Heart failure exacerbation is a serious medical problem that should be treated right away. What are the causes? A heart failure exacerbation can be triggered by: Not taking your heart failure medicines correctly. Infections. Eating an unhealthy diet or a diet that is high in salt (sodium). Drinking too much fluid. Drinking alcohol. Using drugs, such as cocaine or methamphetamine. Not exercising. Other causes include: Other heart conditions such as an irregular heart rhythm (arrhythmia). Worsening heart valve function. Low blood counts (anemia). Other medical problems, such as kidney failure, thyroid problems, or diabetes mellitus. Sometimes the cause of the exacerbation is not known. What are the signs or symptoms? When heart failure symptoms suddenly or slowly get worse, this may be a sign of heart failure exacerbation. Symptoms of heart failure include: Shortness of breath during activity or exercise. A cough that does not go away. Swelling of the legs, ankles, feet, or abdomen. Losing or gaining weight for no reason. Trouble breathing when lying down. Increased heart rate or irregular heartbeat. Fatigue. Feeling light-headed, dizzy, or close to fainting. Nausea or lack of appetite. How is this diagnosed? This condition is diagnosed based on: Your symptoms and medical history. A physical exam. You may also have tests,  including: Electrocardiogram (ECG). This test measures the electrical activity of your heart. Echocardiogram. This test uses sound waves to take a picture of your heart to see how well it works. Blood tests. Imaging tests, such as: Chest X-ray. MRI. Ultrasound. Stress test. This test examines how well your heart functions while you exercise on a treadmill or exercise bike. If you cannot exercise, medicines may be used to increase your heartbeat in place of exercise. Cardiac catheterization. During this test, a thin, flexible tube (catheter) is inserted into a blood vessel and threaded up to your heart. This test allows your health care provider to check the arteries that lead to your heart (coronary arteries). Right heart catheterization. During this test, the pressure in your heart is measured. How is this treated? This condition may be treated by: Adjusting your heart medicines. Maintaining a healthy lifestyle. This includes: Eating a heart-healthy diet that is low in sodium. Not using products that contain nicotine or tobacco. Regular exercise. Monitoring your fluid intake. Monitoring your weight and reporting changes to your health care provider. Not using alcohol or drugs. Treating sleep apnea, if you have this condition. Surgery. This may include: Placing a pacemaker to improve heart function (cardiac resynchronization therapy). Implanting a device that can correct heart rhythm problems (implantable cardioverter defibrillator). Implanting a pulmonary arterial pressure monitor to monitor your fluid balance. Connecting a device to your heart to help it pump blood (ventricular assist device). Heart transplant. Follow these instructions at home: Medicines Take over-the-counter and prescription medicines only as told by your health care provider. Do not stop taking your medicines or change the amount you take. If you are having problems or side effects from your medicines, talk to    your health care provider. If you are having difficulty paying for your medicines, contact a social worker or your clinic. There are many programs to assist with medicine costs. Talk to your health care provider before starting any new medicines or supplements. Make sure your health care provider and pharmacist have a list of all the medicines you are taking. Eating and drinking  Avoid drinking alcohol. Eat a heart-healthy diet as told by your health care provider. This includes: Plenty of fruits and vegetables. Lean proteins. Low-fat dairy. Whole grains. Foods that are low in sodium. Activity  Exercise regularly as told by your health care provider. Balance exercise with rest. Ask your health care provider what activities are safe for you. This includes sexual activity, exercise, and daily tasks at home or work. Lifestyle Do not use any products that contain nicotine or tobacco. These products include cigarettes, chewing tobacco, and vaping devices, such as e-cigarettes. If you need help quitting, ask your health care provider. Maintain a healthy weight. Ask your health care provider what weight is healthy for you. Consider joining a patient support group. This can help with emotional problems you may have, such as stress and anxiety. Do not use drugs. General instructions Stay up to date with vaccines. Talk to your health care provider about flu and pneumonia vaccines. Keep a list of medicines that you are taking. This may help in emergency situations. Keep all follow-up visits. This is important. Contact a health care provider if: You have questions about your medicines or you miss a dose. You feel anxious, depressed, or stressed. You develop swelling in your feet, ankles, legs, or abdomen. You develop a cough. You have a fever. You have trouble sleeping. You gain 2-3 lb (1-1.4 kg) in 24 hours or 5 lb (2.3 kg) in a week. Get help right away if: You have chest pain or  pressure. You have shortness of breath while resting. You have severe fatigue. You are confused. You have severe dizziness. You have a rapid or irregular heartbeat. You have nausea or you vomit. You have a cough that is worse at night or you cannot lie flat. You have severe depression or sadness. These symptoms may represent a serious problem that is an emergency. Do not wait to see if the symptoms will go away. Get medical help right away. Call your local emergency services (911 in the [place] Do not drive yourself to the hospital. Summary When heart failure symptoms get worse, it is called heart failure exacerbation. Common causes of this condition include taking medicines incorrectly, infections, and drinking alcohol. This condition may be treated by adjusting medicines, maintaining a healthy lifestyle, or surgery. Do not stop taking your medicines or change the amount you take. If you are having problems or side effects from your medicines, talk to your health care provider. This information is not intended to replace advice given to you by your health care provider. Make sure you discuss any questions you have with your health care provider. Document Revised: 10/17/2021 Document Reviewed: 01/30/2020 Elsevier Patient Education  [place].

## 2022-07-13 NOTE — Progress Notes (Signed)
Acute Office Visit  Subjective:     Patient ID: Ryan Garner, male    DOB: December 30, 1945, 76 y.o.   MRN: [id number]  Chief Complaint  Patient presents with   Hospitalization Follow-up    HPI Patient is in today for a hospital follow up.   Today's visit was for Transitional Care Management.  The patient was discharged from [facility] on 07/04/22 with a primary diagnosis of acute on chronic congestive heart failure.   Contact with the patient and/or caregiver, by a clinical staff member, was made on 07/05/22 and was documented as a telephone encounter within the EMR.  Through chart review and discussion with the patient I have determined that management of their condition is of moderate complexity.   Ryan Garner was admitted to [facility] on 07/01/22 for increased lower extremity swelling, testicular swelling, shortness of breath, and hypoxia. His CXR was consistent with CHF exacerbation. An echo showed EF of 55% with RV severely dilated, and decreased systolic function. Pulmonary HTN was also noted on the echo. He was also noted to have AKI with an elevated creatine. He was started on IV lasix and 4L of fluids were diuresed. He was also restarted on eliquis for permanent a. Fib. He was instructed to increased lasix to 40 mg BID at discharge.  He reports that he has not been taking great care of himself since discharge. He has been taking lasix 40 mg once daily. He just started this BID yesterday. His wife unfortunately just passed away, and her funeral is tomorrow.   Since discharge he reports that he has felt that his abdomen has fluid and that there is more fluid in his feet. He denies testicular swelling. He also has lymphedema in his lower legs and uses compression pumps at night. He reports that shortness of breath has been stable since discharge. He has not been weighing himself. He denies fever, palpitations, orthopnea, or chest pain. He has an appointment with cardiology on 07/30/21. He is also  established with pulmonology.    ROS As per HPI.      Objective:    BP 138/78   Temp 98.7 F (37.1 C)   Ht _0  (1.727 m)   Wt (!) 320 lb (145.2 kg)   BMI 48.66 kg/m  BP Readings from Last 3 Encounters:  07/13/22 138/78  05/03/22 108/68  04/26/22 (!) 84/62       Physical Exam Vitals and nursing note reviewed.  Constitutional:      General: He is not in acute distress.    Appearance: Normal appearance. He is not ill-appearing, toxic-appearing or diaphoretic.  Cardiovascular:     Rate and Rhythm: Normal rate and regular rhythm.     Heart sounds: Normal heart sounds. No murmur heard. Pulmonary:     Effort: Pulmonary effort is normal. No respiratory distress.     Breath sounds: Normal breath sounds. No wheezing, rhonchi or rales.  Chest:     Chest wall: No tenderness.  Abdominal:     General: There is no distension.     Palpations: Abdomen is soft.     Tenderness: There is no abdominal tenderness. There is no guarding or rebound.  Musculoskeletal:     Cervical back: Neck supple. No rigidity.     Right lower leg: 3+ Pitting Edema present.     Left lower leg: 3+ Pitting Edema present.  Skin:    General: Skin is warm and dry.  Neurological:     General:  No focal deficit present.     Mental Status: He is alert and oriented to person, place, and time.  Psychiatric:        Mood and Affect: Mood normal.        Behavior: Behavior normal.        Thought Content: Thought content normal.        Judgment: Judgment normal.     No results found for any visits on 07/13/22.      Assessment & Plan:   Ryan Garner was seen today for hospitalization follow-up.  Diagnoses and all orders for this visit:  Acute on chronic combined systolic and diastolic congestive heart failure [facility]) Reviewed hospital records, labs, and imaging. Repeat labs pending. Unfortunately patient was only taking lasix 40 mg daily until yesterday. Discussed compliance with BID dosage, daily weights.  Reports symptoms are stable since discharge. Discussed return precautions and when to seek emergency care. Keep follow up appt with cardiology.  -     BMP8+EGFR -     CBC with Differential/Platelet -     Pro b natriuretic peptide  Anasarca Improved.  -     BMP8+EGFR -     CBC with Differential/Platelet -     Pro b natriuretic peptide  Permanent atrial fibrillation (HCC) On eliquis.   OSA (obstructive sleep apnea) On Cpap at home.   Pulmonary hypertension ([place]) Established with pulmonology.   AKI (acute kidney injury) ([place]) Labs pending.  -     BMP8+EGFR  Hypokalemia Labs pending.  -     BMP8+EGFR  Lymphedema Has compression pumps at home.   Candidal intertrigo -     nystatin (MYCOSTATIN/NYSTOP) powder; Apply 1 Application topically 3 (three) times daily.  Return if symptoms worsen or fail to improve.  The patient indicates understanding of these issues and agrees with the plan.   Ryan Perking, FNP

## 2022-07-14 LAB — CBC WITH DIFFERENTIAL/PLATELET
Basophils Absolute: 0 10*3/uL (ref 0.0–0.2)
Basos: 0 %
EOS (ABSOLUTE): 0.3 10*3/uL (ref 0.0–0.4)
Eos: 3 %
Hematocrit: 37.5 % (ref 37.5–51.0)
Hemoglobin: 12.7 g/dL — ABNORMAL LOW (ref 13.0–17.7)
Immature Grans (Abs): 0 10*3/uL (ref 0.0–0.1)
Immature Granulocytes: 0 %
Lymphocytes Absolute: 1 10*3/uL (ref 0.7–3.1)
Lymphs: 13 %
MCH: 31.2 pg (ref 26.6–33.0)
MCHC: 33.9 g/dL (ref 31.5–35.7)
MCV: 92 fL (ref 79–97)
Monocytes Absolute: 0.8 10*3/uL (ref 0.1–0.9)
Monocytes: 11 %
Neutrophils Absolute: 5.4 10*3/uL (ref 1.4–7.0)
Neutrophils: 73 %
Platelets: 258 10*3/uL (ref 150–450)
RBC: 4.07 x10E6/uL — ABNORMAL LOW (ref 4.14–5.80)
RDW: 12.3 % (ref 11.6–15.4)
WBC: 7.5 10*3/uL (ref 3.4–10.8)

## 2022-07-14 LAB — BMP8+EGFR
BUN/Creatinine Ratio: 24 (ref 10–24)
BUN: 41 mg/dL — ABNORMAL HIGH (ref 8–27)
CO2: 25 mmol/L (ref 20–29)
Calcium: 9.7 mg/dL (ref 8.6–10.2)
Chloride: 95 mmol/L — ABNORMAL LOW (ref 96–106)
Creatinine, Ser: 1.68 mg/dL — ABNORMAL HIGH (ref 0.76–1.27)
Glucose: 132 mg/dL — ABNORMAL HIGH (ref 70–99)
Potassium: 4.9 mmol/L (ref 3.5–5.2)
Sodium: 134 mmol/L (ref 134–144)
eGFR: 41 mL/min/{1.73_m2} — ABNORMAL LOW (ref >59)

## 2022-07-14 LAB — PRO B NATRIURETIC PEPTIDE: NT-Pro BNP: 13811 pg/mL — ABNORMAL HIGH (ref 0–486)

## 2022-07-15 DIAGNOSIS — E8770 Fluid overload, unspecified: Secondary | ICD-10-CM | POA: Diagnosis not present

## 2022-07-15 DIAGNOSIS — I482 Chronic atrial fibrillation, unspecified: Secondary | ICD-10-CM | POA: Diagnosis not present

## 2022-07-15 DIAGNOSIS — I890 Lymphedema, not elsewhere classified: Secondary | ICD-10-CM | POA: Diagnosis not present

## 2022-07-15 DIAGNOSIS — Z7902 Long term (current) use of antithrombotics/antiplatelets: Secondary | ICD-10-CM | POA: Diagnosis not present

## 2022-07-15 DIAGNOSIS — R7989 Other specified abnormal findings of blood chemistry: Secondary | ICD-10-CM | POA: Diagnosis not present

## 2022-07-15 DIAGNOSIS — I5023 Acute on chronic systolic (congestive) heart failure: Secondary | ICD-10-CM | POA: Diagnosis not present

## 2022-07-15 DIAGNOSIS — I130 Hypertensive heart and chronic kidney disease with heart failure and stage 1 through stage 4 chronic kidney disease, or unspecified chronic kidney disease: Secondary | ICD-10-CM | POA: Diagnosis not present

## 2022-07-15 DIAGNOSIS — R0600 Dyspnea, unspecified: Secondary | ICD-10-CM | POA: Diagnosis not present

## 2022-07-15 DIAGNOSIS — I080 Rheumatic disorders of both mitral and aortic valves: Secondary | ICD-10-CM | POA: Diagnosis not present

## 2022-07-15 DIAGNOSIS — Z79899 Other long term (current) drug therapy: Secondary | ICD-10-CM | POA: Diagnosis not present

## 2022-07-15 DIAGNOSIS — Z6841 Body Mass Index (BMI) 40.0 and over, adult: Secondary | ICD-10-CM | POA: Diagnosis not present

## 2022-07-15 DIAGNOSIS — Z7901 Long term (current) use of anticoagulants: Secondary | ICD-10-CM | POA: Diagnosis not present

## 2022-07-15 DIAGNOSIS — J811 Chronic pulmonary edema: Secondary | ICD-10-CM | POA: Diagnosis not present

## 2022-07-15 DIAGNOSIS — Z20822 Contact with and (suspected) exposure to covid-19: Secondary | ICD-10-CM | POA: Diagnosis not present

## 2022-07-15 DIAGNOSIS — I509 Heart failure, unspecified: Secondary | ICD-10-CM | POA: Diagnosis not present

## 2022-07-15 DIAGNOSIS — E876 Hypokalemia: Secondary | ICD-10-CM | POA: Diagnosis not present

## 2022-07-15 DIAGNOSIS — R0902 Hypoxemia: Secondary | ICD-10-CM | POA: Diagnosis not present

## 2022-07-15 DIAGNOSIS — I4819 Other persistent atrial fibrillation: Secondary | ICD-10-CM | POA: Diagnosis not present

## 2022-07-15 DIAGNOSIS — N189 Chronic kidney disease, unspecified: Secondary | ICD-10-CM | POA: Diagnosis not present

## 2022-07-15 DIAGNOSIS — Z91040 Latex allergy status: Secondary | ICD-10-CM | POA: Diagnosis not present

## 2022-07-15 DIAGNOSIS — R609 Edema, unspecified: Secondary | ICD-10-CM | POA: Diagnosis not present

## 2022-07-15 DIAGNOSIS — Z955 Presence of coronary angioplasty implant and graft: Secondary | ICD-10-CM | POA: Diagnosis not present

## 2022-07-15 DIAGNOSIS — R0602 Shortness of breath: Secondary | ICD-10-CM | POA: Diagnosis not present

## 2022-07-15 DIAGNOSIS — J9811 Atelectasis: Secondary | ICD-10-CM | POA: Diagnosis not present

## 2022-07-15 DIAGNOSIS — Z9981 Dependence on supplemental oxygen: Secondary | ICD-10-CM | POA: Diagnosis not present

## 2022-07-15 DIAGNOSIS — I10 Essential (primary) hypertension: Secondary | ICD-10-CM | POA: Diagnosis not present

## 2022-07-15 DIAGNOSIS — J90 Pleural effusion, not elsewhere classified: Secondary | ICD-10-CM | POA: Diagnosis not present

## 2022-07-15 DIAGNOSIS — Z634 Disappearance and death of family member: Secondary | ICD-10-CM | POA: Diagnosis not present

## 2022-07-15 DIAGNOSIS — G4733 Obstructive sleep apnea (adult) (pediatric): Secondary | ICD-10-CM | POA: Diagnosis not present

## 2022-07-15 DIAGNOSIS — I2510 Atherosclerotic heart disease of native coronary artery without angina pectoris: Secondary | ICD-10-CM | POA: Diagnosis not present

## 2022-07-15 DIAGNOSIS — I270 Primary pulmonary hypertension: Secondary | ICD-10-CM | POA: Diagnosis not present

## 2022-07-15 DIAGNOSIS — I252 Old myocardial infarction: Secondary | ICD-10-CM | POA: Diagnosis not present

## 2022-07-15 DIAGNOSIS — I50813 Acute on chronic right heart failure: Secondary | ICD-10-CM | POA: Diagnosis not present

## 2022-07-15 DIAGNOSIS — R600 Localized edema: Secondary | ICD-10-CM | POA: Diagnosis not present

## 2022-07-15 DIAGNOSIS — Z1152 Encounter for screening for COVID-19: Secondary | ICD-10-CM | POA: Diagnosis not present

## 2022-07-15 DIAGNOSIS — N179 Acute kidney failure, unspecified: Secondary | ICD-10-CM | POA: Diagnosis not present

## 2022-07-15 DIAGNOSIS — I4821 Permanent atrial fibrillation: Secondary | ICD-10-CM | POA: Diagnosis not present

## 2022-07-15 DIAGNOSIS — Z9989 Dependence on other enabling machines and devices: Secondary | ICD-10-CM | POA: Diagnosis not present

## 2022-07-15 DIAGNOSIS — I272 Pulmonary hypertension, unspecified: Secondary | ICD-10-CM | POA: Diagnosis not present

## 2022-07-16 DIAGNOSIS — R7989 Other specified abnormal findings of blood chemistry: Secondary | ICD-10-CM | POA: Diagnosis not present

## 2022-07-16 DIAGNOSIS — I272 Pulmonary hypertension, unspecified: Secondary | ICD-10-CM | POA: Diagnosis not present

## 2022-07-16 DIAGNOSIS — Z7901 Long term (current) use of anticoagulants: Secondary | ICD-10-CM | POA: Diagnosis not present

## 2022-07-16 DIAGNOSIS — G4733 Obstructive sleep apnea (adult) (pediatric): Secondary | ICD-10-CM | POA: Diagnosis not present

## 2022-07-16 DIAGNOSIS — Z79899 Other long term (current) drug therapy: Secondary | ICD-10-CM | POA: Diagnosis not present

## 2022-07-16 DIAGNOSIS — E8770 Fluid overload, unspecified: Secondary | ICD-10-CM | POA: Diagnosis not present

## 2022-07-16 DIAGNOSIS — Z9989 Dependence on other enabling machines and devices: Secondary | ICD-10-CM | POA: Diagnosis not present

## 2022-07-16 DIAGNOSIS — I482 Chronic atrial fibrillation, unspecified: Secondary | ICD-10-CM | POA: Diagnosis not present

## 2022-07-16 DIAGNOSIS — I509 Heart failure, unspecified: Secondary | ICD-10-CM | POA: Diagnosis not present

## 2022-07-17 DIAGNOSIS — G4733 Obstructive sleep apnea (adult) (pediatric): Secondary | ICD-10-CM | POA: Diagnosis not present

## 2022-07-17 DIAGNOSIS — I482 Chronic atrial fibrillation, unspecified: Secondary | ICD-10-CM | POA: Diagnosis not present

## 2022-07-17 DIAGNOSIS — Z79899 Other long term (current) drug therapy: Secondary | ICD-10-CM | POA: Diagnosis not present

## 2022-07-17 DIAGNOSIS — I50813 Acute on chronic right heart failure: Secondary | ICD-10-CM | POA: Diagnosis not present

## 2022-07-17 DIAGNOSIS — Z7901 Long term (current) use of anticoagulants: Secondary | ICD-10-CM | POA: Diagnosis not present

## 2022-07-17 DIAGNOSIS — R7989 Other specified abnormal findings of blood chemistry: Secondary | ICD-10-CM | POA: Diagnosis not present

## 2022-07-17 DIAGNOSIS — I272 Pulmonary hypertension, unspecified: Secondary | ICD-10-CM | POA: Diagnosis not present

## 2022-07-17 DIAGNOSIS — Z9989 Dependence on other enabling machines and devices: Secondary | ICD-10-CM | POA: Diagnosis not present

## 2022-07-17 DIAGNOSIS — E8770 Fluid overload, unspecified: Secondary | ICD-10-CM | POA: Diagnosis not present

## 2022-07-17 DIAGNOSIS — R600 Localized edema: Secondary | ICD-10-CM | POA: Diagnosis not present

## 2022-07-17 DIAGNOSIS — I4821 Permanent atrial fibrillation: Secondary | ICD-10-CM | POA: Diagnosis not present

## 2022-07-18 DIAGNOSIS — E876 Hypokalemia: Secondary | ICD-10-CM | POA: Diagnosis not present

## 2022-07-18 DIAGNOSIS — Z9989 Dependence on other enabling machines and devices: Secondary | ICD-10-CM | POA: Diagnosis not present

## 2022-07-18 DIAGNOSIS — I272 Pulmonary hypertension, unspecified: Secondary | ICD-10-CM | POA: Diagnosis not present

## 2022-07-18 DIAGNOSIS — Z79899 Other long term (current) drug therapy: Secondary | ICD-10-CM | POA: Diagnosis not present

## 2022-07-18 DIAGNOSIS — G4733 Obstructive sleep apnea (adult) (pediatric): Secondary | ICD-10-CM | POA: Diagnosis not present

## 2022-07-18 DIAGNOSIS — Z7901 Long term (current) use of anticoagulants: Secondary | ICD-10-CM | POA: Diagnosis not present

## 2022-07-18 DIAGNOSIS — R7989 Other specified abnormal findings of blood chemistry: Secondary | ICD-10-CM | POA: Diagnosis not present

## 2022-07-18 DIAGNOSIS — I482 Chronic atrial fibrillation, unspecified: Secondary | ICD-10-CM | POA: Diagnosis not present

## 2022-07-18 DIAGNOSIS — I509 Heart failure, unspecified: Secondary | ICD-10-CM | POA: Diagnosis not present

## 2022-07-19 DIAGNOSIS — Z7901 Long term (current) use of anticoagulants: Secondary | ICD-10-CM | POA: Diagnosis not present

## 2022-07-19 DIAGNOSIS — Z9981 Dependence on supplemental oxygen: Secondary | ICD-10-CM | POA: Diagnosis not present

## 2022-07-19 DIAGNOSIS — I482 Chronic atrial fibrillation, unspecified: Secondary | ICD-10-CM | POA: Diagnosis not present

## 2022-07-19 DIAGNOSIS — I509 Heart failure, unspecified: Secondary | ICD-10-CM | POA: Diagnosis not present

## 2022-07-19 DIAGNOSIS — Z79899 Other long term (current) drug therapy: Secondary | ICD-10-CM | POA: Diagnosis not present

## 2022-07-19 DIAGNOSIS — G4733 Obstructive sleep apnea (adult) (pediatric): Secondary | ICD-10-CM | POA: Diagnosis not present

## 2022-07-19 DIAGNOSIS — Z9989 Dependence on other enabling machines and devices: Secondary | ICD-10-CM | POA: Diagnosis not present

## 2022-07-19 DIAGNOSIS — I272 Pulmonary hypertension, unspecified: Secondary | ICD-10-CM | POA: Diagnosis not present

## 2022-07-19 DIAGNOSIS — E876 Hypokalemia: Secondary | ICD-10-CM | POA: Diagnosis not present

## 2022-07-20 DIAGNOSIS — R7989 Other specified abnormal findings of blood chemistry: Secondary | ICD-10-CM | POA: Diagnosis not present

## 2022-07-20 DIAGNOSIS — Z7901 Long term (current) use of anticoagulants: Secondary | ICD-10-CM | POA: Diagnosis not present

## 2022-07-20 DIAGNOSIS — I272 Pulmonary hypertension, unspecified: Secondary | ICD-10-CM | POA: Diagnosis not present

## 2022-07-20 DIAGNOSIS — I4819 Other persistent atrial fibrillation: Secondary | ICD-10-CM | POA: Diagnosis not present

## 2022-07-20 DIAGNOSIS — E8770 Fluid overload, unspecified: Secondary | ICD-10-CM | POA: Diagnosis not present

## 2022-07-20 DIAGNOSIS — I509 Heart failure, unspecified: Secondary | ICD-10-CM | POA: Diagnosis not present

## 2022-07-20 DIAGNOSIS — G4733 Obstructive sleep apnea (adult) (pediatric): Secondary | ICD-10-CM | POA: Diagnosis not present

## 2022-07-20 DIAGNOSIS — Z9989 Dependence on other enabling machines and devices: Secondary | ICD-10-CM | POA: Diagnosis not present

## 2022-07-20 DIAGNOSIS — Z79899 Other long term (current) drug therapy: Secondary | ICD-10-CM | POA: Diagnosis not present

## 2022-07-20 DIAGNOSIS — N179 Acute kidney failure, unspecified: Secondary | ICD-10-CM | POA: Diagnosis not present

## 2022-07-21 DIAGNOSIS — L97819 Non-pressure chronic ulcer of other part of right lower leg with unspecified severity: Secondary | ICD-10-CM | POA: Diagnosis not present

## 2022-07-21 DIAGNOSIS — I272 Pulmonary hypertension, unspecified: Secondary | ICD-10-CM | POA: Diagnosis not present

## 2022-07-21 DIAGNOSIS — J449 Chronic obstructive pulmonary disease, unspecified: Secondary | ICD-10-CM | POA: Diagnosis not present

## 2022-07-21 DIAGNOSIS — I5023 Acute on chronic systolic (congestive) heart failure: Secondary | ICD-10-CM | POA: Diagnosis not present

## 2022-07-21 DIAGNOSIS — E8881 Metabolic syndrome: Secondary | ICD-10-CM | POA: Diagnosis not present

## 2022-07-21 DIAGNOSIS — N179 Acute kidney failure, unspecified: Secondary | ICD-10-CM | POA: Diagnosis not present

## 2022-07-24 ENCOUNTER — Telehealth: Payer: Self-pay | Admitting: Orthopedic Surgery

## 2022-07-24 DIAGNOSIS — J449 Chronic obstructive pulmonary disease, unspecified: Secondary | ICD-10-CM | POA: Diagnosis not present

## 2022-07-24 DIAGNOSIS — L97819 Non-pressure chronic ulcer of other part of right lower leg with unspecified severity: Secondary | ICD-10-CM | POA: Diagnosis not present

## 2022-07-24 DIAGNOSIS — I272 Pulmonary hypertension, unspecified: Secondary | ICD-10-CM | POA: Diagnosis not present

## 2022-07-24 DIAGNOSIS — N179 Acute kidney failure, unspecified: Secondary | ICD-10-CM | POA: Diagnosis not present

## 2022-07-24 DIAGNOSIS — E8881 Metabolic syndrome: Secondary | ICD-10-CM | POA: Diagnosis not present

## 2022-07-24 DIAGNOSIS — I5023 Acute on chronic systolic (congestive) heart failure: Secondary | ICD-10-CM | POA: Diagnosis not present

## 2022-07-24 NOTE — Telephone Encounter (Signed)
tina from [facility] [phone].  Otila Kluver would like verbal orders for wound care.

## 2022-07-24 NOTE — Telephone Encounter (Signed)
SW Ryan Garner, she says that pt has a new wound on lower left leg. He has been putting neosporin on this wound and wrapping it. I advised her to have him continue dial soap cleaning and using compression wrap with ACE from below knee to below toes. I will work on getting him in sooner than 08/09/22 since we haven't seen the new wound.

## 2022-07-25 ENCOUNTER — Telehealth: Payer: Self-pay

## 2022-07-25 ENCOUNTER — Ambulatory Visit (INDEPENDENT_AMBULATORY_CARE_PROVIDER_SITE_OTHER): Payer: Medicare Other

## 2022-07-25 DIAGNOSIS — G4733 Obstructive sleep apnea (adult) (pediatric): Secondary | ICD-10-CM

## 2022-07-25 DIAGNOSIS — E119 Type 2 diabetes mellitus without complications: Secondary | ICD-10-CM | POA: Diagnosis not present

## 2022-07-25 DIAGNOSIS — Z6841 Body Mass Index (BMI) 40.0 and over, adult: Secondary | ICD-10-CM

## 2022-07-25 DIAGNOSIS — I5023 Acute on chronic systolic (congestive) heart failure: Secondary | ICD-10-CM | POA: Diagnosis not present

## 2022-07-25 DIAGNOSIS — I2510 Atherosclerotic heart disease of native coronary artery without angina pectoris: Secondary | ICD-10-CM | POA: Diagnosis not present

## 2022-07-25 DIAGNOSIS — I890 Lymphedema, not elsewhere classified: Secondary | ICD-10-CM

## 2022-07-25 DIAGNOSIS — L97819 Non-pressure chronic ulcer of other part of right lower leg with unspecified severity: Secondary | ICD-10-CM

## 2022-07-25 DIAGNOSIS — N179 Acute kidney failure, unspecified: Secondary | ICD-10-CM | POA: Diagnosis not present

## 2022-07-25 DIAGNOSIS — E8881 Metabolic syndrome: Secondary | ICD-10-CM | POA: Diagnosis not present

## 2022-07-25 DIAGNOSIS — E876 Hypokalemia: Secondary | ICD-10-CM

## 2022-07-25 DIAGNOSIS — I080 Rheumatic disorders of both mitral and aortic valves: Secondary | ICD-10-CM

## 2022-07-25 DIAGNOSIS — I4819 Other persistent atrial fibrillation: Secondary | ICD-10-CM

## 2022-07-25 DIAGNOSIS — I272 Pulmonary hypertension, unspecified: Secondary | ICD-10-CM

## 2022-07-25 DIAGNOSIS — I7121 Aneurysm of the ascending aorta, without rupture: Secondary | ICD-10-CM

## 2022-07-25 DIAGNOSIS — J449 Chronic obstructive pulmonary disease, unspecified: Secondary | ICD-10-CM

## 2022-07-25 NOTE — Telephone Encounter (Signed)
Pt is scheduled 07/31/22 to follow up with Dr. Sharol Given.

## 2022-07-25 NOTE — Patient Outreach (Signed)
  Care Coordination [facility] Note Transition Care Management Follow-up Telephone Call Date of discharge and from where: 07/20/22 [facility] dx acute on chronic heart failure How have you been since you were released from the hospital? I am feeling weak but not any worse.   Any questions or concerns? No  Items Reviewed: Did the pt receive and understand the discharge instructions provided? Yes  Medications obtained and verified? Yes  Other? No  Any new allergies since your discharge? No  Dietary orders reviewed? Yes Do you have support at home? Yes   Home Care and Equipment/Supplies: Were home health services ordered? yes If so, what is the name of the agency? [facility]  Has the agency set up a time to come to the patient's home? yes Were any new equipment or medical supplies ordered?  No What is the name of the medical supply agency? N/a Were you able to get the supplies/equipment? not applicable Do you have any questions related to the use of the equipment or supplies? No  Functional Questionnaire: (I = Independent and D = Dependent) ADLs: I  Bathing/Dressing- I  Meal Prep- I  Eating- I  Maintaining continence- I  Transferring/Ambulation- I  Managing Meds- I  Follow up appointments reviewed:  PCP Hospital f/u appt confirmed? No  Scheduled to see  on  @ . [facility] f/u appt confirmed? Yes  Scheduled to see Dr. Harrington Challenger on 07/30/22 @ 2:40PM. Are transportation arrangements needed? No  If their condition worsens, is the pt aware to call PCP or go to the Emergency Dept.? Yes Was the patient provided with contact information for the PCP's office or ED? Yes Was to pt encouraged to call back with questions or concerns? Yes  SDOH assessments and interventions completed:   Yes SDOH Interventions Today    Flowsheet Row Most Recent Value  SDOH Interventions   Food Insecurity Interventions Intervention Not Indicated  Transportation Interventions Intervention Not  Indicated       Care Coordination Interventions:  Reviewed HF action plan and reviewed importance of daily weights.  Declines further care coordination follow up    Encounter Outcome:  Pt. Visit Completed   Peter Garter RN, BSN,CCM, CDE Care Management Coordinator [facility] [phone]

## 2022-07-27 ENCOUNTER — Telehealth: Payer: Self-pay | Admitting: *Deleted

## 2022-07-27 DIAGNOSIS — N179 Acute kidney failure, unspecified: Secondary | ICD-10-CM | POA: Diagnosis not present

## 2022-07-27 DIAGNOSIS — I5023 Acute on chronic systolic (congestive) heart failure: Secondary | ICD-10-CM | POA: Diagnosis not present

## 2022-07-27 DIAGNOSIS — L97819 Non-pressure chronic ulcer of other part of right lower leg with unspecified severity: Secondary | ICD-10-CM | POA: Diagnosis not present

## 2022-07-27 DIAGNOSIS — I272 Pulmonary hypertension, unspecified: Secondary | ICD-10-CM | POA: Diagnosis not present

## 2022-07-27 DIAGNOSIS — E8881 Metabolic syndrome: Secondary | ICD-10-CM | POA: Diagnosis not present

## 2022-07-27 DIAGNOSIS — J449 Chronic obstructive pulmonary disease, unspecified: Secondary | ICD-10-CM | POA: Diagnosis not present

## 2022-07-27 NOTE — Telephone Encounter (Signed)
TC from [place] w/ [facility] was out seeing pt today, pt was coughing a lot causing him to spit up or vomit at times, he is very shaky, she was not able to get an acurrate BP on him, his O2 was 95%, has no fever. A neighbor brought him some Mucinex to take. PT encouraged him to go to the hospital he stated ' that he would rather lay there and die before going back to the hospital' Pt has an appt on Monday w/ Cardiology and w/ Ortho on Tuesday Any recommendations

## 2022-07-27 NOTE — Telephone Encounter (Signed)
Virtual visit scheduled with [name] for 1/8 at 4:30p

## 2022-07-27 NOTE — Telephone Encounter (Signed)
I would say if he is feeling that bad he needs to go in.  If he had called in earlier today to maybe we could fit him in and seen him today but at this point I do not think anything is available so I would say if he worsens at all over the weekend and going to the emergency department

## 2022-07-29 NOTE — Progress Notes (Signed)
ID:  Ryan Garner, DOB April 16, 1946, MRN [id number]  PCP:  [name], [name], MD  Cardiologist:   Dorris Carnes, MD   Pt presents for follow up of CHF  History of Present Illness: Ryan Garner is a 77 y.o. male with a history of HTN, HL, pre DM, morbid obesity, atrial fibrillation and CAD.  He was admitted to [facility] on 04/30/20 with CP and STEMI.  Underwent cath with PTCA/DES of LCx    Flecanide stopped  He was seen by Beckie Salts (EP  plan for rate control and anticoagulatoin  Not a candidate for Tikosyn.  Amiodarone also not felt to be a good choice.    Echo in Nov 2022 MR appeared mild to moderate I saw the pt in clinic in Feb 2023   He was hosp x 2 for leg infection requiring debridement    During last hopsitalizion he required transient rescuscitation due to bradycardia     Echo in September 2023 MR appear severe    The pt presents today for follow up    He says he has been feeling poorly for a few days    SOB   Sputum production   Clear   Some green   No fever PT says his edema is about the same   Denies CP    He called [facility] for a televisit while he was in the parking lot for today's appt    Dx bronchitis   Rx has been called in      Allergies:   Flecainide and Rofecoxib   Past Medical History:  Diagnosis Date   A-fib ([place])    Acute on chronic diastolic CHF (congestive heart failure) ([place]) 07/21/2020   Coronary artery disease    Diabetes mellitus without complication ([place])    Dyspnea    Dysrhythmia    A-fib   Hyperlipidemia    Hypertension    Metabolic syndrome    Prediabetes    Sleep apnea    [facility]I (ST elevation [facility])     Past Surgical History:  Procedure Laterality Date   APPENDECTOMY     CHOLECYSTECTOMY N/A 03/02/2020   Procedure: LAPAROSCOPIC CHOLECYSTECTOMY;  Surgeon: Aviva Signs, MD;  Location: [facility];  Service: General;  Laterality: N/A;   CORONARY STENT INTERVENTION N/A 04/30/2020   Procedure: CORONARY STENT INTERVENTION;   Surgeon: Martinique, Peter M, MD;  Location: [place] [facility];  Service: Cardiovascular;  Laterality: N/A;   CORONARY/GRAFT ACUTE MI REVASCULARIZATION N/A 04/30/2020   Procedure: Coronary/Graft Acute MI Revascularization;  Surgeon: Martinique, Peter M, MD;  Location: [place] [facility] LAB;  Service: Cardiovascular;  Laterality: N/A;   Eyelid Surgery Bilateral    HERNIA REPAIR     I & D EXTREMITY Left 04/06/2022   Procedure: LEFT LEG DEBRIDEMENT;  Surgeon: Newt Minion, MD;  Location: [place];  Service: Orthopedics;  Laterality: Left;   I & D EXTREMITY Left 04/04/2022   Procedure: LEFT LEG DEBRIDEMENT;  Surgeon: Newt Minion, MD;  Location: [place];  Service: Orthopedics;  Laterality: Left;   knee tendons repair     LEFT HEART CATH AND CORONARY ANGIOGRAPHY N/A 04/30/2020   Procedure: LEFT HEART CATH AND CORONARY ANGIOGRAPHY;  Surgeon: Martinique, Peter M, MD;  Location: [place] [facility];  Service: Cardiovascular;  Laterality: N/A;   ROTATOR CUFF REPAIR Bilateral    TONSILLECTOMY       Social History:  The patient  reports that he quit  smoking about 63 years ago. His smoking use included cigarettes. He has never used smokeless tobacco. He reports that he does not drink alcohol and does not use drugs.   Family History:  The patient's family history includes Arthritis in his father; Cancer in his mother; Coronary artery disease in his mother; Diabetes in his brother and maternal grandmother; Heart attack in his brother.    ROS:  Please see the history of present illness. All other systems are reviewed and  Negative to the above problem except as noted.    PHYSICAL EXAM: VS:  BP 138/72   Pulse (!) 49   Ht '5\' 8"'$  (1.727 m)   SpO2 (!) 86%   BMI 48.66 kg/m   GEN: Morbidly obese 77 yo   Examined in chair HEENT: normal  Neck: Neck is full  JVP increased Cardiac: Irreg irreg ; no murmurs   2 + LE edema (wearing support hose) Respiratory: Diffuse rhonchi throughout both lung fieds    GI: soft, obese   Nontender  MS: no deformity Moving all extremities   Skin: warm and dry,  Not examined under clothes Neuro:  Grossly normal   Psych: euthymic mood, full affect   EKG:  EKG is not done today    Sept 21/23 Very limited study to evaluated mitral regurgitation. Severe mitral valve regurgitation appears to be related to restricted PMVL. Jet is posteriorly directed. Unchanged from prior. The mitral valve is abnormal. Severe mitral valve regurgitation. No evidence of mitral stenosis. 1. Left ventricular ejection fraction, by estimation, is 50 to 55%. The left ventricle has low normal function. The left ventricle demonstrates global hypokinesis. There is the interventricular septum is flattened in systole and diastole, consistent with right ventricular pressure and volume overload. 2. Right ventricular systolic function was not well visualized. The right ventricular size is not well visualized. 3. Comparison(s): No significant change from prior study.  Sept 2023  Left ventricular ejection fraction, by estimation, is 50 to 55%. The left ventricle has mildly decreased function. The left ventricle has no regional wall motion abnormalities. There is mild concentric left ventricular hypertrophy. Left ventricular diastolic function could not be evaluated. There is the interventricular septum is flattened in systole and diastole, consistent with right ventricular pressure and volume overload. 1. Right ventricular systolic function is moderately reduced. The right ventricular size is moderately enlarged. There is severely elevated pulmonary artery systolic pressure. The estimated right ventricular systolic pressure is 46.2 mmHg. 2. 3. Left atrial size was severely dilated. 4. Right atrial size was severely dilated. The mitral valve is grossly normal. Severe mitral valve regurgitation. Moderate mitral annular calcification. 5. The aortic valve is tricuspid. There is severe calcifcation of  the aortic valve. There is severe thickening of the aortic valve. Aortic valve regurgitation is not visualized. Moderate aortic valve stenosis. Aortic valve mean gradient measures 17.0 mmHg. Aortic valve Vmax measures 2.71 m/s. Aortic valve acceleration time measures 120 msec. 6. Aortic dilatation noted. There is mild dilatation of the ascending aorta, measuring 41 mm. There is mild dilatation of the aortic root, measuring 39 mm. Cardiac Cath 10/9    Cath: 04/30/20   Ramus lesion is 75% stenosed. Mid Cx lesion is 99% stenosed. Post intervention, there is a 0% residual stenosis. A drug-eluting stent was successfully placed using a STENT RESOLUTE ONYX 2.5X34. The left ventricular systolic function is normal. LV end diastolic pressure is mildly elevated. The left ventricular ejection fraction is 55-65% by visual estimate.   1. Single vessel  obstructive CAD involving the mid to distal LCx 2. Good LV systolic function 3. Elevated LVEDP 4. Successful PCI of the mid to distal LCx with DES x 1.   Plan: ASA 81 mg daily for one month. Plavix 75 mg daily for one year. May resume Eliquis tomorrow. IV diuresis. Will need to stop Flecainide. Consider alternative AAD therapy.    Lipid Panel    Component Value Date/Time   CHOL 92 (L) 12/15/2021 1259   TRIG 60 12/15/2021 1259   TRIG 74 08/27/2014 0826   HDL 35 (L) 12/15/2021 1259   HDL 56 08/27/2014 0826   CHOLHDL 2.6 12/15/2021 1259   CHOLHDL 4.1 05/01/2020 1759   VLDL 13 05/01/2020 1759   LDLCALC 43 12/15/2021 1259   LDLCALC 78 11/13/2013 0911      Wt Readings from Last 3 Encounters:  07/13/22 (!) 320 lb (145.2 kg)  07/04/22 293 lb (132.9 kg)  05/03/22 293 lb (132.9 kg)      ASSESSMENT AND PLAN:  1  Dyspnea.  Pt with marked dyspnea today    He may have a bronchitis but  volume also appears increased on exam    MR probably contributing  Agree with Abx emprically   will get BMET and BNP and CBC   Will also get CXR     Pt does  not want to go to the hospital  2  Acute on chronic diastolic CHF   As noted above  3   Mitral regurgitation.   When climically improved would plan for TEE to further evaulate mechanism of MR       4  Atrial fibrillation  Permanent   Not a good candidate for antiarrhythmic   Rate control and anticoagulation   3    CAD   patient is status post NSTEMI with intervention to the circumflex in October 2021.  Not convinced he is having an active angina     4.  HL  Keep on atorvastatin.    Current medicines are reviewed at length with the patient today.  The patient does not have concerns regarding medicines.  Signed, Dorris Carnes, MD  07/30/2022 9:24 PM    [address], [place], [place]  [address] Phone: [phone]; Fax: [phone]

## 2022-07-30 ENCOUNTER — Telehealth (INDEPENDENT_AMBULATORY_CARE_PROVIDER_SITE_OTHER): Payer: Medicare Other | Admitting: Family Medicine

## 2022-07-30 ENCOUNTER — Ambulatory Visit: Payer: Medicare Other | Attending: Internal Medicine | Admitting: Internal Medicine

## 2022-07-30 ENCOUNTER — Encounter: Payer: Self-pay | Admitting: Family Medicine

## 2022-07-30 ENCOUNTER — Ambulatory Visit
Admission: RE | Admit: 2022-07-30 | Discharge: 2022-07-30 | Disposition: A | Discharge status: Home / Self Care | Payer: Medicare Other | Source: Ambulatory Visit | Attending: Internal Medicine | Admitting: Internal Medicine

## 2022-07-30 ENCOUNTER — Encounter: Payer: Self-pay | Admitting: Internal Medicine

## 2022-07-30 VITALS — BP 138/72 | HR 49 | Ht 68.0 in

## 2022-07-30 DIAGNOSIS — J209 Acute bronchitis, unspecified: Secondary | ICD-10-CM | POA: Diagnosis not present

## 2022-07-30 DIAGNOSIS — R059 Cough, unspecified: Secondary | ICD-10-CM | POA: Diagnosis not present

## 2022-07-30 DIAGNOSIS — I340 Nonrheumatic mitral (valve) insufficiency: Secondary | ICD-10-CM | POA: Diagnosis not present

## 2022-07-30 DIAGNOSIS — I5023 Acute on chronic systolic (congestive) heart failure: Secondary | ICD-10-CM | POA: Diagnosis not present

## 2022-07-30 DIAGNOSIS — Z79899 Other long term (current) drug therapy: Secondary | ICD-10-CM | POA: Diagnosis not present

## 2022-07-30 DIAGNOSIS — R0602 Shortness of breath: Secondary | ICD-10-CM | POA: Diagnosis not present

## 2022-07-30 DIAGNOSIS — E8881 Metabolic syndrome: Secondary | ICD-10-CM | POA: Diagnosis not present

## 2022-07-30 DIAGNOSIS — L97819 Non-pressure chronic ulcer of other part of right lower leg with unspecified severity: Secondary | ICD-10-CM | POA: Diagnosis not present

## 2022-07-30 DIAGNOSIS — I272 Pulmonary hypertension, unspecified: Secondary | ICD-10-CM | POA: Diagnosis not present

## 2022-07-30 DIAGNOSIS — J440 Chronic obstructive pulmonary disease with acute lower respiratory infection: Secondary | ICD-10-CM | POA: Diagnosis not present

## 2022-07-30 DIAGNOSIS — J449 Chronic obstructive pulmonary disease, unspecified: Secondary | ICD-10-CM | POA: Diagnosis not present

## 2022-07-30 DIAGNOSIS — N179 Acute kidney failure, unspecified: Secondary | ICD-10-CM | POA: Diagnosis not present

## 2022-07-30 MED ORDER — ALBUTEROL SULFATE HFA 108 (90 BASE) MCG/ACT IN AERS: 2.0000 | INHALATION_SPRAY | Freq: Four times a day (QID) | RESPIRATORY_TRACT | 0 refills | Status: DC | PRN

## 2022-07-30 MED ORDER — AMOXICILLIN-POT CLAVULANATE 875-125 MG PO TABS: 1.0000 | ORAL_TABLET | Freq: Two times a day (BID) | ORAL | 0 refills | Status: DC

## 2022-07-30 NOTE — Progress Notes (Signed)
Virtual Visit via telephone Note He was unable to pull up the video  I connected with Ryan Garner on 07/30/22 at 1426 by telephone and verified that I am speaking with the correct person using two identifiers. Ryan Garner is currently located at home and  son  are currently with her during visit. The provider, [name] Verley Pariseau, MD is located in their office at time of visit.  Call ended at 1434  I discussed the limitations, risks, security and privacy concerns of performing an evaluation and management service by telephone and the availability of in person appointments. I also discussed with the patient that there may be a patient responsible charge related to this service. The patient expressed understanding and agreed to proceed.   History and Present Illness: Patient is having some chest congestion. He is coughing up a lot of phlegm.  Started 3 days ago. He took mucinex and it is not helping. He is short of breath or wheezy. His weight is normal.  He is feeling somewhat wheezy but not short of breath.  His cough is productive of yellow-green sputum.  He denies any fluid or increased weight.  He denies any sick contacts that he knows of.  He just feels like he is getting it down in his chest like a bronchitis that he cannot clear it.  1. Acute bronchitis with COPD [facility])     Outpatient Encounter Medications as of 07/30/2022  Medication Sig   albuterol (VENTOLIN HFA) 108 (90 Base) MCG/ACT inhaler Inhale 2 puffs into the lungs every 6 (six) hours as needed for wheezing or shortness of breath.   amoxicillin-clavulanate (AUGMENTIN) 875-125 MG tablet Take 1 tablet by mouth 2 (two) times daily.   apixaban (ELIQUIS) 5 MG TABS tablet Take 1 tablet (5 mg total) by mouth 2 (two) times daily.   ascorbic acid (VITAMIN C) 1000 MG tablet Take 1 tablet (1,000 mg total) by mouth daily.   atorvastatin (LIPITOR) 80 MG tablet Take 1 tablet by mouth once daily   clopidogrel (PLAVIX) 75 MG tablet Take 1  tablet (75 mg total) by mouth daily with breakfast.   cyanocobalamin (VITAMIN B12) 1000 MCG tablet Take 1,000 mcg by mouth daily.   furosemide (LASIX) 80 MG tablet Take 1 tablet (80 mg total) by mouth 2 (two) times daily.   metoprolol tartrate (LOPRESSOR) 25 MG tablet Take 1 tablet (25 mg total) by mouth 2 (two) times daily.   Multiple Vitamin (MULTIVITAMIN WITH MINERALS) TABS tablet Take 1 tablet by mouth daily.   nitroGLYCERIN (NITROSTAT) 0.4 MG SL tablet Place 1 tablet (0.4 mg total) under the tongue every 5 (five) minutes x 3 doses as needed for chest pain.   nutrition supplement, JUVEN, (JUVEN) PACK Take 1 packet by mouth 2 (two) times daily between meals.   nystatin (MYCOSTATIN/NYSTOP) powder Apply 1 Application topically 3 (three) times daily.   potassium chloride SA (KLOR-CON M) 20 MEQ tablet Take 2 tablets (40 mEq total) by mouth daily.   Tiotropium Bromide Monohydrate (SPIRIVA RESPIMAT) 1.25 MCG/ACT AERS Inhale 2 puffs into the lungs daily.   zinc sulfate 220 (50 Zn) MG capsule Take 1 capsule (220 mg total) by mouth daily.   [DISCONTINUED] doxycycline (VIBRA-TABS) 100 MG tablet TAKE 1 TABLET BY MOUTH EVERY 12 HOURS   [DISCONTINUED] levofloxacin (LEVAQUIN) 750 MG tablet TAKE 1 TABLET BY MOUTH AT BEDTIME   No facility-administered encounter medications on file as of 07/30/2022.    Review of Systems  Constitutional:  Negative for chills and fever.  HENT:  Positive for congestion, postnasal drip, sinus pressure, sore throat and voice change. Negative for ear discharge, ear pain, rhinorrhea and sneezing.   Eyes:  Negative for pain, discharge, redness and visual disturbance.  Respiratory:  Positive for cough and wheezing. Negative for shortness of breath.   Cardiovascular:  Negative for chest pain and leg swelling.  Musculoskeletal:  Negative for gait problem.  Skin:  Negative for rash.  All other systems reviewed and are negative.   Observations/Objective: Patient sounds comfortable  and in no acute distress  Assessment and Plan: Problem List Items Addressed This Visit   None Visit Diagnoses     Acute bronchitis with COPD ([place])    -  Primary   Relevant Medications   amoxicillin-clavulanate (AUGMENTIN) 875-125 MG tablet   albuterol (VENTOLIN HFA) 108 (90 Base) MCG/ACT inhaler       Sent in Augmentin and albuterol for him, if he is not getting better from there we may try some prednisone because it seems like this is a COPD exacerbation.  But due to his diabetes I did not send in the prednisone yet. Follow up plan: Return if symptoms worsen or fail to improve.     I discussed the assessment and treatment plan with the patient. The patient was provided an opportunity to ask questions and all were answered. The patient agreed with the plan and demonstrated an understanding of the instructions.   The patient was advised to call back or seek an in-person evaluation if the symptoms worsen or if the condition fails to improve as anticipated.  The above assessment and management plan was discussed with the patient. The patient verbalized understanding of and has agreed to the management plan. Patient is aware to call the clinic if symptoms persist or worsen. Patient is aware when to return to the clinic for a follow-up visit. Patient educated on when it is appropriate to go to the emergency department.    I provided 8 minutes of non-face-to-face time during this encounter.    Worthy Rancher, MD

## 2022-07-30 NOTE — Patient Instructions (Signed)
Medication Instructions:   *If you need a refill on your cardiac medications before your next appointment, please call your pharmacy*   Lab Work: CMET, CBC, PRO BNP If you have labs (blood work) drawn today and your tests are completely normal, you will receive your results only by: [place] (if you have MyChart) OR A paper copy in the mail If you have any lab test that is abnormal or we need to change your treatment, we will call you to review the results.   Testing/Procedures: CHEST XRAY STAT    Follow-Up: At [facility], you and your health needs are our priority.  As part of our continuing mission to provide you with exceptional heart care, we have created designated Provider Care Teams.  These Care Teams include your primary Cardiologist (physician) and Advanced Practice Providers (APPs -  Physician Assistants and Nurse Practitioners) who all work together to provide you with the care you need, when you need it.  We recommend signing up for the patient portal called "MyChart".  Sign up information is provided on this After Visit Summary.  MyChart is used to connect with patients for Virtual Visits (Telemedicine).  Patients are able to view lab/test results, encounter notes, upcoming appointments, etc.  Non-urgent messages can be sent to your provider as well.   To learn more about what you can do with MyChart, go to [email or web address].     Important Information About Sugar

## 2022-07-31 ENCOUNTER — Ambulatory Visit (INDEPENDENT_AMBULATORY_CARE_PROVIDER_SITE_OTHER): Payer: Medicare Other | Admitting: Orthopedic Surgery

## 2022-07-31 ENCOUNTER — Telehealth: Payer: Self-pay

## 2022-07-31 ENCOUNTER — Encounter: Payer: Self-pay | Admitting: Orthopedic Surgery

## 2022-07-31 DIAGNOSIS — I872 Venous insufficiency (chronic) (peripheral): Secondary | ICD-10-CM | POA: Diagnosis not present

## 2022-07-31 DIAGNOSIS — I340 Nonrheumatic mitral (valve) insufficiency: Secondary | ICD-10-CM

## 2022-07-31 DIAGNOSIS — I890 Lymphedema, not elsewhere classified: Secondary | ICD-10-CM

## 2022-07-31 DIAGNOSIS — L97921 Non-pressure chronic ulcer of unspecified part of left lower leg limited to breakdown of skin: Secondary | ICD-10-CM

## 2022-07-31 DIAGNOSIS — R0602 Shortness of breath: Secondary | ICD-10-CM

## 2022-07-31 LAB — CBC
Hematocrit: 38.3 % (ref 37.5–51.0)
Hemoglobin: 12.5 g/dL — ABNORMAL LOW (ref 13.0–17.7)
MCH: 29.6 pg (ref 26.6–33.0)
MCHC: 32.6 g/dL (ref 31.5–35.7)
MCV: 91 fL (ref 79–97)
Platelets: 308 10*3/uL (ref 150–450)
RBC: 4.22 x10E6/uL (ref 4.14–5.80)
RDW: 13 % (ref 11.6–15.4)
WBC: 9.6 10*3/uL (ref 3.4–10.8)

## 2022-07-31 LAB — COMPREHENSIVE METABOLIC PANEL
ALT: 18 IU/L (ref 0–44)
AST: 31 IU/L (ref 0–40)
Albumin/Globulin Ratio: 1.1 — ABNORMAL LOW (ref 1.2–2.2)
Albumin: 3.3 g/dL — ABNORMAL LOW (ref 3.8–4.8)
Alkaline Phosphatase: 109 IU/L (ref 44–121)
BUN/Creatinine Ratio: 23 (ref 10–24)
BUN: 37 mg/dL — ABNORMAL HIGH (ref 8–27)
Bilirubin Total: 1 mg/dL (ref 0.0–1.2)
CO2: 23 mmol/L (ref 20–29)
Calcium: 9.6 mg/dL (ref 8.6–10.2)
Chloride: 98 mmol/L (ref 96–106)
Creatinine, Ser: 1.6 mg/dL — ABNORMAL HIGH (ref 0.76–1.27)
Globulin, Total: 3.1 g/dL (ref 1.5–4.5)
Glucose: 110 mg/dL — ABNORMAL HIGH (ref 70–99)
Potassium: 4.8 mmol/L (ref 3.5–5.2)
Sodium: 138 mmol/L (ref 134–144)
Total Protein: 6.4 g/dL (ref 6.0–8.5)
eGFR: 44 mL/min/{1.73_m2} — ABNORMAL LOW (ref >59)

## 2022-07-31 LAB — PRO B NATRIURETIC PEPTIDE: NT-Pro BNP: 18056 pg/mL — ABNORMAL HIGH (ref 0–486)

## 2022-07-31 MED ORDER — FUROSEMIDE 80 MG PO TABS: 120.0000 mg | ORAL_TABLET | Freq: Two times a day (BID) | ORAL | Status: DC

## 2022-07-31 NOTE — Telephone Encounter (Signed)
Patient had elevated BNP 18,056. Reviewed with DOD, Dr. Gasper Sells, after he reviewed Dr. Harrington Challenger' note, will increase lasix 120 mg by mouth BID and get BNP in 1 week. Called patient with change. Patient agreed to plan.

## 2022-07-31 NOTE — Progress Notes (Signed)
Office Visit Note   Patient: Ryan Garner           Date of Birth: February 05, 1946           MRN: [id number] Visit Date: 07/31/2022              Requested by: [name], [name], MD [place],  [place] [id number] PCP: [name], [name], MD  Chief Complaint  Patient presents with   Left Leg - Follow-up    04/06/2022 LLE debridement and kerecis graft       HPI: Patient is a 77 year old gentleman who presents with new venous ulceration left lower extremity with weeping edema and previous wound proximal medial aspect of the left leg that is healing well.  Patient states he was recently hospitalized for several weeks with congestive heart failure.  Patient states he is currently on Lasix.  Assessment & Plan: Visit Diagnoses:  1. Lymphedema of both lower extremities   2. Nonhealing ulcer of left lower leg limited to breakdown of skin ([place])   3. Venous stasis dermatitis of both lower extremities     Plan: Recommended patient resume using his lymphedema pumps 1 hour twice a day recommended elevation and compression and range of motion of the foot and ankle.  Patient may require Kerecis tissue graft for the venous ulcers.  Follow-Up Instructions: Return in about 2 weeks (around 08/14/2022).   Ortho Exam  Patient is alert, oriented, no adenopathy, well-dressed, normal affect, normal respiratory effort. Examination of the left leg the previous wound medial aspect of the knee is almost completely healed.  This has healthy tissue that is 2 cm in diameter.  On the lateral aspect of the left leg patient has weeping edema with 3 venous ulcers 1 is 1 x 2 cm the other is 2 cm in diameter the other is 1 cm in diameter.  Patient has significant pitting edema in the leg foot and ankle.  Imaging: No results found.     Labs: Lab Results  Component Value Date   HGBA1C 7.0 (H) 12/15/2021   HGBA1C 6.4 (H) 04/28/2021   HGBA1C 6.7 10/27/2020   REPTSTATUS 04/11/2022 FINAL 04/04/2022    GRAMSTAIN NO WBC SEEN NO ORGANISMS SEEN  04/04/2022   CULT  04/04/2022    RARE SERRATIA MARCESCENS NO ANAEROBES ISOLATED Performed at [facility], [place] [address] [place], [place] [address]    LABORGA SERRATIA MARCESCENS 04/04/2022     Lab Results  Component Value Date   ALBUMIN 3.3 (L) 07/30/2022   ALBUMIN 2.7 (L) 04/14/2022   ALBUMIN 2.6 (L) 04/13/2022    Lab Results  Component Value Date   MG 1.6 (L) 04/14/2022   MG 1.5 (L) 04/13/2022   MG 1.7 04/11/2022   Lab Results  Component Value Date   VD25OH 36.7 04/14/2020    No results found for: "PREALBUMIN"    Latest Ref Rng & Units 07/30/2022    3:24 PM 07/13/2022   11:30 AM 04/14/2022    5:55 AM  CBC EXTENDED  WBC 3.4 - 10.8 x10E3/uL 9.6  7.5  10.4   RBC 4.14 - 5.80 x10E6/uL 4.22  4.07  3.44   Hemoglobin 13.0 - 17.7 g/dL 12.5  12.7  10.9   HCT 37.5 - 51.0 % 38.3  37.5  31.3   Platelets 150 - 450 x10E3/uL 308  258  258   NEUT# 1.4 - 7.0 x10E3/uL  5.4  7.1   Lymph# 0.7 - 3.1  x10E3/uL  1.0  1.4      There is no height or weight on file to calculate BMI.  Orders:  No orders of the defined types were placed in this encounter.  No orders of the defined types were placed in this encounter.    Procedures: No procedures performed  Clinical Data: No additional findings.  ROS:  All other systems negative, except as noted in the HPI. Review of Systems  Objective: Vital Signs: There were no vitals taken for this visit.  Specialty Comments:  No specialty comments available.  PMFS History: Patient Active Problem List   Diagnosis Date Noted   Pulmonary hypertension ([place]) 07/13/2022   OSA (obstructive sleep apnea) 07/13/2022   Lymphedema 07/13/2022   Nonrheumatic mitral (valve) insufficiency    Aortic valve stenosis, nonrheumatic    Hypotension    Transient hypotension 04/06/2022   Bradycardia 04/06/2022   (HFpEF) heart failure with preserved ejection fraction ([place]) 04/06/2022   Abscess of leg,  left 04/04/2022   Cellulitis of left lower extremity 03/02/2022   Chronic diastolic CHF (congestive heart failure) ([place]) 03/02/2022   Cellulitis of left leg 03/02/2022   Foot callus 12/15/2021   Other secondary pulmonary hypertension ([place]) 08/30/2021   COPD (chronic obstructive pulmonary disease) ([place]) 08/30/2021   Coronary artery disease due to type 2 diabetes mellitus ([place]) 07/21/2020   CHF (congestive heart failure) ([place]) 07/21/2020   History of ST elevation myocardial infarction (STEMI) 04/30/2020   Atrial fibrillation ([place]) 02/29/2020   AKI (acute kidney injury) ([place]) 02/28/2020   Hypokalemia 02/28/2020   Hyponatremia 02/28/2020   Lumbar radiculopathy 03/24/2019   Morbid obesity ([place]) 09/04/2018   Lung nodules 01/17/2017   Ascending aortic aneurysm 01/17/2017   Type 2 diabetes mellitus ([place]) 03/20/2016   Tear of lateral meniscus of right knee 11/19/2014   Hypertension    Hyperlipidemia    Metabolic syndrome    Osteoarthritis of both knees 07/06/2013   Acquired spondylolisthesis 03/06/2013   Degeneration of lumbar intervertebral disc 03/06/2013   Lumbar spondylosis 03/06/2013   Spinal stenosis of lumbar region 03/06/2013   Past Medical History:  Diagnosis Date   A-fib [facility])    Acute on chronic diastolic CHF (congestive heart failure) ([place]) 07/21/2020   Coronary artery disease    Diabetes mellitus without complication ([facility])    Dyspnea    Dysrhythmia    A-fib   Hyperlipidemia    Hypertension    Metabolic syndrome    Prediabetes    Sleep apnea    STEMI (ST elevation myocardial infarction) ([place])     Family History  Problem Relation Age of Onset   Cancer Mother        lungs   Coronary artery disease Mother    Diabetes Brother    Heart attack Brother    Diabetes Maternal Grandmother    Arthritis Father     Past Surgical History:  Procedure Laterality Date   APPENDECTOMY     CHOLECYSTECTOMY N/A 03/02/2020   Procedure: LAPAROSCOPIC CHOLECYSTECTOMY;  Surgeon:  Aviva Signs, MD;  Location: [facility];  Service: General;  Laterality: N/A;   CORONARY STENT INTERVENTION N/A 04/30/2020   Procedure: CORONARY STENT INTERVENTION;  Surgeon: Martinique, Peter M, MD;  Location: [place] [facility];  Service: Cardiovascular;  Laterality: N/A;   CORONARY/GRAFT ACUTE MI REVASCULARIZATION N/A 04/30/2020   Procedure: Coronary/Graft Acute MI Revascularization;  Surgeon: Martinique, Peter M, MD;  Location: [place] [facility];  Service: Cardiovascular;  Laterality: N/A;   Eyelid Surgery Bilateral  HERNIA REPAIR     I & D EXTREMITY Left 04/06/2022   Procedure: LEFT LEG DEBRIDEMENT;  Surgeon: Newt Minion, MD;  Location: [place];  Service: Orthopedics;  Laterality: Left;   I & D EXTREMITY Left 04/04/2022   Procedure: LEFT LEG DEBRIDEMENT;  Surgeon: Newt Minion, MD;  Location: [place];  Service: Orthopedics;  Laterality: Left;   knee tendons repair     LEFT HEART CATH AND CORONARY ANGIOGRAPHY N/A 04/30/2020   Procedure: LEFT HEART CATH AND CORONARY ANGIOGRAPHY;  Surgeon: Martinique, Peter M, MD;  Location: [place] [facility];  Service: Cardiovascular;  Laterality: N/A;   ROTATOR CUFF REPAIR Bilateral    TONSILLECTOMY     Social History   Occupational History   Occupation: Theme park manager    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

## 2022-07-31 NOTE — Telephone Encounter (Signed)
Pam-- I am in [employer] here in [place] working   Please, please, please reach out to me on this    I am reading echoes, seeing patient   Not aware of all the labs coming in

## 2022-08-01 DIAGNOSIS — I272 Pulmonary hypertension, unspecified: Secondary | ICD-10-CM | POA: Diagnosis not present

## 2022-08-01 DIAGNOSIS — N179 Acute kidney failure, unspecified: Secondary | ICD-10-CM | POA: Diagnosis not present

## 2022-08-01 DIAGNOSIS — J449 Chronic obstructive pulmonary disease, unspecified: Secondary | ICD-10-CM | POA: Diagnosis not present

## 2022-08-01 DIAGNOSIS — E8881 Metabolic syndrome: Secondary | ICD-10-CM | POA: Diagnosis not present

## 2022-08-01 DIAGNOSIS — I5023 Acute on chronic systolic (congestive) heart failure: Secondary | ICD-10-CM | POA: Diagnosis not present

## 2022-08-01 DIAGNOSIS — L97819 Non-pressure chronic ulcer of other part of right lower leg with unspecified severity: Secondary | ICD-10-CM | POA: Diagnosis not present

## 2022-08-03 DIAGNOSIS — J449 Chronic obstructive pulmonary disease, unspecified: Secondary | ICD-10-CM | POA: Diagnosis not present

## 2022-08-03 DIAGNOSIS — I272 Pulmonary hypertension, unspecified: Secondary | ICD-10-CM | POA: Diagnosis not present

## 2022-08-03 DIAGNOSIS — N179 Acute kidney failure, unspecified: Secondary | ICD-10-CM | POA: Diagnosis not present

## 2022-08-03 DIAGNOSIS — I5023 Acute on chronic systolic (congestive) heart failure: Secondary | ICD-10-CM | POA: Diagnosis not present

## 2022-08-03 DIAGNOSIS — E8881 Metabolic syndrome: Secondary | ICD-10-CM | POA: Diagnosis not present

## 2022-08-03 DIAGNOSIS — L97819 Non-pressure chronic ulcer of other part of right lower leg with unspecified severity: Secondary | ICD-10-CM | POA: Diagnosis not present

## 2022-08-08 DIAGNOSIS — I5023 Acute on chronic systolic (congestive) heart failure: Secondary | ICD-10-CM | POA: Diagnosis not present

## 2022-08-08 DIAGNOSIS — J449 Chronic obstructive pulmonary disease, unspecified: Secondary | ICD-10-CM | POA: Diagnosis not present

## 2022-08-08 DIAGNOSIS — I272 Pulmonary hypertension, unspecified: Secondary | ICD-10-CM | POA: Diagnosis not present

## 2022-08-08 DIAGNOSIS — E8881 Metabolic syndrome: Secondary | ICD-10-CM | POA: Diagnosis not present

## 2022-08-08 DIAGNOSIS — L97819 Non-pressure chronic ulcer of other part of right lower leg with unspecified severity: Secondary | ICD-10-CM | POA: Diagnosis not present

## 2022-08-08 DIAGNOSIS — N179 Acute kidney failure, unspecified: Secondary | ICD-10-CM | POA: Diagnosis not present

## 2022-08-09 ENCOUNTER — Ambulatory Visit: Payer: Medicare Other | Attending: Internal Medicine

## 2022-08-09 ENCOUNTER — Other Ambulatory Visit: Payer: Self-pay | Admitting: Family Medicine

## 2022-08-09 ENCOUNTER — Ambulatory Visit (INDEPENDENT_AMBULATORY_CARE_PROVIDER_SITE_OTHER): Payer: Medicare Other | Admitting: Orthopedic Surgery

## 2022-08-09 DIAGNOSIS — E1122 Type 2 diabetes mellitus with diabetic chronic kidney disease: Secondary | ICD-10-CM | POA: Diagnosis not present

## 2022-08-09 DIAGNOSIS — I50813 Acute on chronic right heart failure: Secondary | ICD-10-CM | POA: Diagnosis not present

## 2022-08-09 DIAGNOSIS — E1151 Type 2 diabetes mellitus with diabetic peripheral angiopathy without gangrene: Secondary | ICD-10-CM | POA: Diagnosis not present

## 2022-08-09 DIAGNOSIS — L97929 Non-pressure chronic ulcer of unspecified part of left lower leg with unspecified severity: Secondary | ICD-10-CM

## 2022-08-09 DIAGNOSIS — R0602 Shortness of breath: Secondary | ICD-10-CM | POA: Diagnosis not present

## 2022-08-09 DIAGNOSIS — I83029 Varicose veins of left lower extremity with ulcer of unspecified site: Secondary | ICD-10-CM | POA: Diagnosis not present

## 2022-08-09 DIAGNOSIS — M1990 Unspecified osteoarthritis, unspecified site: Secondary | ICD-10-CM | POA: Diagnosis not present

## 2022-08-09 DIAGNOSIS — I4819 Other persistent atrial fibrillation: Secondary | ICD-10-CM | POA: Diagnosis not present

## 2022-08-09 DIAGNOSIS — E782 Mixed hyperlipidemia: Secondary | ICD-10-CM | POA: Diagnosis not present

## 2022-08-09 DIAGNOSIS — I872 Venous insufficiency (chronic) (peripheral): Secondary | ICD-10-CM

## 2022-08-09 DIAGNOSIS — I7121 Aneurysm of the ascending aorta, without rupture: Secondary | ICD-10-CM | POA: Diagnosis not present

## 2022-08-09 DIAGNOSIS — I083 Combined rheumatic disorders of mitral, aortic and tricuspid valves: Secondary | ICD-10-CM | POA: Diagnosis not present

## 2022-08-09 DIAGNOSIS — N189 Chronic kidney disease, unspecified: Secondary | ICD-10-CM | POA: Diagnosis not present

## 2022-08-09 DIAGNOSIS — I890 Lymphedema, not elsewhere classified: Secondary | ICD-10-CM | POA: Diagnosis not present

## 2022-08-09 DIAGNOSIS — I4519 Other right bundle-branch block: Secondary | ICD-10-CM | POA: Diagnosis not present

## 2022-08-09 DIAGNOSIS — R600 Localized edema: Secondary | ICD-10-CM | POA: Diagnosis not present

## 2022-08-09 DIAGNOSIS — I252 Old myocardial infarction: Secondary | ICD-10-CM | POA: Diagnosis not present

## 2022-08-09 DIAGNOSIS — Z7901 Long term (current) use of anticoagulants: Secondary | ICD-10-CM | POA: Diagnosis not present

## 2022-08-09 DIAGNOSIS — Z7902 Long term (current) use of antithrombotics/antiplatelets: Secondary | ICD-10-CM | POA: Diagnosis not present

## 2022-08-09 DIAGNOSIS — I340 Nonrheumatic mitral (valve) insufficiency: Secondary | ICD-10-CM

## 2022-08-09 DIAGNOSIS — I2510 Atherosclerotic heart disease of native coronary artery without angina pectoris: Secondary | ICD-10-CM | POA: Diagnosis not present

## 2022-08-09 DIAGNOSIS — J209 Acute bronchitis, unspecified: Secondary | ICD-10-CM

## 2022-08-09 DIAGNOSIS — N5089 Other specified disorders of the male genital organs: Secondary | ICD-10-CM | POA: Diagnosis not present

## 2022-08-09 DIAGNOSIS — N179 Acute kidney failure, unspecified: Secondary | ICD-10-CM | POA: Diagnosis not present

## 2022-08-09 DIAGNOSIS — I83892 Varicose veins of left lower extremities with other complications: Secondary | ICD-10-CM

## 2022-08-09 DIAGNOSIS — J449 Chronic obstructive pulmonary disease, unspecified: Secondary | ICD-10-CM | POA: Diagnosis not present

## 2022-08-09 DIAGNOSIS — L97829 Non-pressure chronic ulcer of other part of left lower leg with unspecified severity: Secondary | ICD-10-CM | POA: Diagnosis not present

## 2022-08-09 DIAGNOSIS — I272 Pulmonary hypertension, unspecified: Secondary | ICD-10-CM | POA: Diagnosis not present

## 2022-08-09 DIAGNOSIS — G4733 Obstructive sleep apnea (adult) (pediatric): Secondary | ICD-10-CM | POA: Diagnosis not present

## 2022-08-09 DIAGNOSIS — I130 Hypertensive heart and chronic kidney disease with heart failure and stage 1 through stage 4 chronic kidney disease, or unspecified chronic kidney disease: Secondary | ICD-10-CM | POA: Diagnosis not present

## 2022-08-09 DIAGNOSIS — E8881 Metabolic syndrome: Secondary | ICD-10-CM | POA: Diagnosis not present

## 2022-08-10 DIAGNOSIS — N189 Chronic kidney disease, unspecified: Secondary | ICD-10-CM | POA: Diagnosis not present

## 2022-08-10 DIAGNOSIS — E1151 Type 2 diabetes mellitus with diabetic peripheral angiopathy without gangrene: Secondary | ICD-10-CM | POA: Diagnosis not present

## 2022-08-10 DIAGNOSIS — N179 Acute kidney failure, unspecified: Secondary | ICD-10-CM | POA: Diagnosis not present

## 2022-08-10 DIAGNOSIS — E1122 Type 2 diabetes mellitus with diabetic chronic kidney disease: Secondary | ICD-10-CM | POA: Diagnosis not present

## 2022-08-10 DIAGNOSIS — I50813 Acute on chronic right heart failure: Secondary | ICD-10-CM | POA: Diagnosis not present

## 2022-08-10 DIAGNOSIS — I130 Hypertensive heart and chronic kidney disease with heart failure and stage 1 through stage 4 chronic kidney disease, or unspecified chronic kidney disease: Secondary | ICD-10-CM | POA: Diagnosis not present

## 2022-08-10 LAB — PRO B NATRIURETIC PEPTIDE: NT-Pro BNP: 10061 pg/mL — ABNORMAL HIGH (ref 0–486)

## 2022-08-13 ENCOUNTER — Other Ambulatory Visit: Payer: Self-pay

## 2022-08-13 DIAGNOSIS — N179 Acute kidney failure, unspecified: Secondary | ICD-10-CM | POA: Diagnosis not present

## 2022-08-13 DIAGNOSIS — E1151 Type 2 diabetes mellitus with diabetic peripheral angiopathy without gangrene: Secondary | ICD-10-CM | POA: Diagnosis not present

## 2022-08-13 DIAGNOSIS — Z79899 Other long term (current) drug therapy: Secondary | ICD-10-CM

## 2022-08-13 DIAGNOSIS — N189 Chronic kidney disease, unspecified: Secondary | ICD-10-CM | POA: Diagnosis not present

## 2022-08-13 DIAGNOSIS — E1122 Type 2 diabetes mellitus with diabetic chronic kidney disease: Secondary | ICD-10-CM | POA: Diagnosis not present

## 2022-08-13 DIAGNOSIS — I340 Nonrheumatic mitral (valve) insufficiency: Secondary | ICD-10-CM

## 2022-08-13 DIAGNOSIS — I5033 Acute on chronic diastolic (congestive) heart failure: Secondary | ICD-10-CM

## 2022-08-13 DIAGNOSIS — I130 Hypertensive heart and chronic kidney disease with heart failure and stage 1 through stage 4 chronic kidney disease, or unspecified chronic kidney disease: Secondary | ICD-10-CM | POA: Diagnosis not present

## 2022-08-13 DIAGNOSIS — I50813 Acute on chronic right heart failure: Secondary | ICD-10-CM | POA: Diagnosis not present

## 2022-08-14 ENCOUNTER — Encounter: Payer: Self-pay | Admitting: Orthopedic Surgery

## 2022-08-14 DIAGNOSIS — I130 Hypertensive heart and chronic kidney disease with heart failure and stage 1 through stage 4 chronic kidney disease, or unspecified chronic kidney disease: Secondary | ICD-10-CM | POA: Diagnosis not present

## 2022-08-14 DIAGNOSIS — N189 Chronic kidney disease, unspecified: Secondary | ICD-10-CM | POA: Diagnosis not present

## 2022-08-14 DIAGNOSIS — E1151 Type 2 diabetes mellitus with diabetic peripheral angiopathy without gangrene: Secondary | ICD-10-CM | POA: Diagnosis not present

## 2022-08-14 DIAGNOSIS — E1122 Type 2 diabetes mellitus with diabetic chronic kidney disease: Secondary | ICD-10-CM | POA: Diagnosis not present

## 2022-08-14 DIAGNOSIS — I50813 Acute on chronic right heart failure: Secondary | ICD-10-CM | POA: Diagnosis not present

## 2022-08-14 DIAGNOSIS — N179 Acute kidney failure, unspecified: Secondary | ICD-10-CM | POA: Diagnosis not present

## 2022-08-14 NOTE — Progress Notes (Signed)
Office Visit Note   Patient: Ryan Garner           Date of Birth: 1946-03-26           MRN: [id number] Visit Date: 08/09/2022              Requested by: [name], [name], MD [place],  [place] [address] PCP: [name], [name], MD  Chief Complaint  Patient presents with   Left Leg - Pain      HPI: Patient is a 77 year old gentleman who presents in follow-up for venous and lymphatic ulcer left lower extremity.  She patient states he is swelling drainage and odor.  Assessment & Plan: Visit Diagnoses:  1. Lymphedema of both lower extremities   2. Venous stasis dermatitis of both lower extremities   3. Venous stasis ulcer of left lower leg with edema of left lower leg (HCC)     Plan: Donated Kerecis micro graft was applied followed by a 3 layer compression wrap.  Follow-up in the office in 1 week.  Will apply for authorization for [facility] application for the venous ulcer.  Follow-Up Instructions: No follow-ups on file.   Ortho Exam  Patient is alert, oriented, no adenopathy, well-dressed, normal affect, normal respiratory effort. Examination patient has swelling of the left lower extremity there is minimal wrinkling there is brawny venous stasis changes.  The venous ulcer measures 2.5 x 2.5 cm and 1 mm deep.  There is no cellulitis odor or drainage.  Calf measures 48 cm in circumference.  9 cm of donated micro graft was applied to the wound covered with Adaptic and a compression wrap.  Imaging: No results found.    Labs: Lab Results  Component Value Date   HGBA1C 7.0 (H) 12/15/2021   HGBA1C 6.4 (H) 04/28/2021   HGBA1C 6.7 10/27/2020   REPTSTATUS 04/11/2022 FINAL 04/04/2022   GRAMSTAIN NO WBC SEEN NO ORGANISMS SEEN  04/04/2022   CULT  04/04/2022    RARE SERRATIA MARCESCENS NO ANAEROBES ISOLATED Performed at [facility], [place] [address], [place], [place] [address]    LABORGA SERRATIA MARCESCENS 04/04/2022     Lab Results  Component Value  Date   ALBUMIN 3.3 (L) 07/30/2022   ALBUMIN 2.7 (L) 04/14/2022   ALBUMIN 2.6 (L) 04/13/2022    Lab Results  Component Value Date   MG 1.6 (L) 04/14/2022   MG 1.5 (L) 04/13/2022   MG 1.7 04/11/2022   Lab Results  Component Value Date   VD25OH 36.7 04/14/2020    No results found for: "PREALBUMIN"    Latest Ref Rng & Units 07/30/2022    3:24 PM 07/13/2022   11:30 AM 04/14/2022    5:55 AM  CBC EXTENDED  WBC 3.4 - 10.8 x10E3/uL 9.6  7.5  10.4   RBC 4.14 - 5.80 x10E6/uL 4.22  4.07  3.44   Hemoglobin 13.0 - 17.7 g/dL 12.5  12.7  10.9   HCT 37.5 - 51.0 % 38.3  37.5  31.3   Platelets 150 - 450 x10E3/uL 308  258  258   NEUT# 1.4 - 7.0 x10E3/uL  5.4  7.1   Lymph# 0.7 - 3.1 x10E3/uL  1.0  1.4      There is no height or weight on file to calculate BMI.  Orders:  No orders of the defined types were placed in this encounter.  No orders of the defined types were placed in this encounter.    Procedures: No  procedures performed  Clinical Data: No additional findings.  ROS:  All other systems negative, except as noted in the HPI. Review of Systems  Objective: Vital Signs: There were no vitals taken for this visit.  Specialty Comments:  No specialty comments available.  PMFS History: Patient Active Problem List   Diagnosis Date Noted   Pulmonary hypertension ([place]) 07/13/2022   OSA (obstructive sleep apnea) 07/13/2022   Lymphedema 07/13/2022   Nonrheumatic mitral (valve) insufficiency    Aortic valve stenosis, nonrheumatic    Hypotension    Transient hypotension 04/06/2022   Bradycardia 04/06/2022   (HFpEF) heart failure with preserved ejection fraction ([place]) 04/06/2022   Abscess of leg, left 04/04/2022   Cellulitis of left lower extremity 03/02/2022   Chronic diastolic CHF (congestive heart failure) ([place]) 03/02/2022   Cellulitis of left leg 03/02/2022   Foot callus 12/15/2021   Other secondary pulmonary hypertension ([place]) 08/30/2021   COPD (chronic obstructive  pulmonary disease) ([place]) 08/30/2021   Coronary artery disease due to type 2 diabetes mellitus ([place]) 07/21/2020   CHF (congestive heart failure) ([place]) 07/21/2020   History of ST elevation myocardial infarction (STEMI) 04/30/2020   Atrial fibrillation ([place]) 02/29/2020   AKI (acute kidney injury) ([place]) 02/28/2020   Hypokalemia 02/28/2020   Hyponatremia 02/28/2020   Lumbar radiculopathy 03/24/2019   Morbid obesity ([place]) 09/04/2018   Lung nodules 01/17/2017   Ascending aortic aneurysm 01/17/2017   Type 2 diabetes mellitus ([place]) 03/20/2016   Tear of lateral meniscus of right knee 11/19/2014   Hypertension    Hyperlipidemia    Metabolic syndrome    Osteoarthritis of both knees 07/06/2013   Acquired spondylolisthesis 03/06/2013   Degeneration of lumbar intervertebral disc 03/06/2013   Lumbar spondylosis 03/06/2013   Spinal stenosis of lumbar region 03/06/2013   Past Medical History:  Diagnosis Date   A-fib [facility])    Acute on chronic diastolic CHF (congestive heart failure) ([place]) 07/21/2020   Coronary artery disease    Diabetes mellitus without complication (HCC)    Dyspnea    Dysrhythmia    A-fib   Hyperlipidemia    Hypertension    Metabolic syndrome    Prediabetes    Sleep apnea    STEMI (ST elevation myocardial infarction) ([place])     Family History  Problem Relation Age of Onset   Cancer Mother        lungs   Coronary artery disease Mother    Diabetes Brother    Heart attack Brother    Diabetes Maternal Grandmother    Arthritis Father     Past Surgical History:  Procedure Laterality Date   APPENDECTOMY     CHOLECYSTECTOMY N/A 03/02/2020   Procedure: LAPAROSCOPIC CHOLECYSTECTOMY;  Surgeon: Aviva Signs, MD;  Location: [facility];  Service: General;  Laterality: N/A;   CORONARY STENT INTERVENTION N/A 04/30/2020   Procedure: CORONARY STENT INTERVENTION;  Surgeon: Martinique, Peter M, MD;  Location: [place] [facility];  Service: Cardiovascular;  Laterality: N/A;   CORONARY/GRAFT  ACUTE MI REVASCULARIZATION N/A 04/30/2020   Procedure: Coronary/Graft Acute MI Revascularization;  Surgeon: Martinique, Peter M, MD;  Location: [place] [facility];  Service: Cardiovascular;  Laterality: N/A;   Eyelid Surgery Bilateral    HERNIA REPAIR     I & D EXTREMITY Left 04/06/2022   Procedure: LEFT LEG DEBRIDEMENT;  Surgeon: Newt Minion, MD;  Location: [facility];  Service: Orthopedics;  Laterality: Left;   I & D EXTREMITY Left 04/04/2022   Procedure: LEFT LEG DEBRIDEMENT;  Surgeon: Newt Minion, MD;  Location: [facility];  Service: Orthopedics;  Laterality: Left;   knee tendons repair     LEFT HEART CATH AND CORONARY ANGIOGRAPHY N/A 04/30/2020   Procedure: LEFT HEART CATH AND CORONARY ANGIOGRAPHY;  Surgeon: Martinique, Peter M, MD;  Location: [facility];  Service: Cardiovascular;  Laterality: N/A;   ROTATOR CUFF REPAIR Bilateral    TONSILLECTOMY     Social History   Occupational History   Occupation: Theme park manager    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

## 2022-08-15 ENCOUNTER — Ambulatory Visit: Payer: Medicare Other | Admitting: Family

## 2022-08-15 ENCOUNTER — Ambulatory Visit: Payer: Medicare Other | Attending: Internal Medicine

## 2022-08-15 DIAGNOSIS — I5033 Acute on chronic diastolic (congestive) heart failure: Secondary | ICD-10-CM

## 2022-08-15 DIAGNOSIS — Z79899 Other long term (current) drug therapy: Secondary | ICD-10-CM

## 2022-08-15 DIAGNOSIS — I340 Nonrheumatic mitral (valve) insufficiency: Secondary | ICD-10-CM | POA: Diagnosis not present

## 2022-08-15 LAB — BASIC METABOLIC PANEL
BUN/Creatinine Ratio: 28 — ABNORMAL HIGH (ref 10–24)
BUN: 30 mg/dL — ABNORMAL HIGH (ref 8–27)
CO2: 25 mmol/L (ref 20–29)
Calcium: 9.3 mg/dL (ref 8.6–10.2)
Chloride: 99 mmol/L (ref 96–106)
Creatinine, Ser: 1.06 mg/dL (ref 0.76–1.27)
Glucose: 106 mg/dL — ABNORMAL HIGH (ref 70–99)
Potassium: 3.5 mmol/L (ref 3.5–5.2)
Sodium: 140 mmol/L (ref 134–144)
eGFR: 72 mL/min/{1.73_m2} (ref >59)

## 2022-08-15 LAB — PRO B NATRIURETIC PEPTIDE: NT-Pro BNP: 5315 pg/mL — ABNORMAL HIGH (ref 0–486)

## 2022-08-16 ENCOUNTER — Telehealth: Payer: Self-pay

## 2022-08-16 ENCOUNTER — Other Ambulatory Visit: Payer: Self-pay

## 2022-08-16 DIAGNOSIS — I340 Nonrheumatic mitral (valve) insufficiency: Secondary | ICD-10-CM

## 2022-08-16 DIAGNOSIS — R0602 Shortness of breath: Secondary | ICD-10-CM

## 2022-08-16 DIAGNOSIS — I5032 Chronic diastolic (congestive) heart failure: Secondary | ICD-10-CM

## 2022-08-16 DIAGNOSIS — Z79899 Other long term (current) drug therapy: Secondary | ICD-10-CM

## 2022-08-16 DIAGNOSIS — I5033 Acute on chronic diastolic (congestive) heart failure: Secondary | ICD-10-CM

## 2022-08-16 NOTE — Telephone Encounter (Signed)
I moved him! Thank you so much!

## 2022-08-16 NOTE — Telephone Encounter (Signed)
This pt has an in office donated graft applied 08/09/2022 and profore dressing. He was supposed to come in yesterday and was a NS. Can you please call to see if the pt can come in tomorrow with Junie Panning or on Tuesday with Dr. Sharol Given? Thank  you!!

## 2022-08-21 ENCOUNTER — Encounter: Payer: Self-pay | Admitting: Orthopedic Surgery

## 2022-08-21 ENCOUNTER — Ambulatory Visit (INDEPENDENT_AMBULATORY_CARE_PROVIDER_SITE_OTHER): Payer: Medicare Other | Admitting: Orthopedic Surgery

## 2022-08-21 DIAGNOSIS — R600 Localized edema: Secondary | ICD-10-CM | POA: Diagnosis not present

## 2022-08-21 DIAGNOSIS — L97929 Non-pressure chronic ulcer of unspecified part of left lower leg with unspecified severity: Secondary | ICD-10-CM

## 2022-08-21 DIAGNOSIS — N189 Chronic kidney disease, unspecified: Secondary | ICD-10-CM | POA: Diagnosis not present

## 2022-08-21 DIAGNOSIS — I83892 Varicose veins of left lower extremities with other complications: Secondary | ICD-10-CM

## 2022-08-21 DIAGNOSIS — E1122 Type 2 diabetes mellitus with diabetic chronic kidney disease: Secondary | ICD-10-CM | POA: Diagnosis not present

## 2022-08-21 DIAGNOSIS — N179 Acute kidney failure, unspecified: Secondary | ICD-10-CM | POA: Diagnosis not present

## 2022-08-21 DIAGNOSIS — I83029 Varicose veins of left lower extremity with ulcer of unspecified site: Secondary | ICD-10-CM | POA: Diagnosis not present

## 2022-08-21 DIAGNOSIS — E1151 Type 2 diabetes mellitus with diabetic peripheral angiopathy without gangrene: Secondary | ICD-10-CM | POA: Diagnosis not present

## 2022-08-21 DIAGNOSIS — I130 Hypertensive heart and chronic kidney disease with heart failure and stage 1 through stage 4 chronic kidney disease, or unspecified chronic kidney disease: Secondary | ICD-10-CM | POA: Diagnosis not present

## 2022-08-21 DIAGNOSIS — I890 Lymphedema, not elsewhere classified: Secondary | ICD-10-CM | POA: Diagnosis not present

## 2022-08-21 DIAGNOSIS — I50813 Acute on chronic right heart failure: Secondary | ICD-10-CM | POA: Diagnosis not present

## 2022-08-21 NOTE — Progress Notes (Addendum)
Office Visit Note   Patient: Ryan Garner           Date of Birth: 02/18/1946           MRN: [id number] Visit Date: 08/21/2022              Requested by: [name], [name], MD [place],  [place] [address] PCP: [name], [name], MD  Chief Complaint  Patient presents with   Left Leg - Follow-up    F/u in office donation Kerecis graft 08/09/2022      HPI: Patient is a 77 year old gentleman who presents in follow-up for lymphedema both lower extremities currently using lymphedema pumps as well as 4 months status post debridement and tissue graft to an ulcer to left leg with 2 new superficial venous ulcers on the left leg.  Patient also complains of groin fungal rash.  Patient continues to use the compression pumps in conjunction with exercise and elevation, and lymphatic massaging.  Patient has significant truncal lymphedema.  Patient has used lymphedema basic pumps for over 4 months.  Patient has hyperkeratotic skin lesions with hyperpigmentation and brawny edema of his legs.    Assessment & Plan: Visit Diagnoses:  1. Lymphedema of both lower extremities   2. Venous stasis ulcer of left lower leg with edema of left lower leg (HCC)     Plan: Recommended using Mycostatin powder 3 times a day and keeping the area open with a towel that will be changed 3 times a day as well.  Recommended resuming his compression sock for the left lower extremity.  Follow-Up Instructions: No follow-ups on file.   Ortho Exam  Patient is alert, oriented, no adenopathy, well-dressed, normal affect, normal respiratory effort. Examination patient is wound of the left lower extremity that was treated with Kerecis graft has completely healed.  He has 2 small superficial ulcers on the left leg 1 is 1 cm diameter the other is 5 mm in diameter that are 0.1 mm deep with healthy granulation tissue.  Examination of the groin patient's pannus is causing a large area of fungal rash against the  thigh.  This is superficial red and moist.  Original hip measurements on 05/29/2022 shows a circumference of 145 cm.  Imaging: No results found. No images are attached to the encounter.  Labs: Lab Results  Component Value Date   HGBA1C 7.0 (H) 12/15/2021   HGBA1C 6.4 (H) 04/28/2021   HGBA1C 6.7 10/27/2020   REPTSTATUS 04/11/2022 FINAL 04/04/2022   GRAMSTAIN NO WBC SEEN NO ORGANISMS SEEN  04/04/2022   CULT  04/04/2022    RARE SERRATIA MARCESCENS NO ANAEROBES ISOLATED Performed at [facility], [place] [address], [place], [place] [address]    LABORGA SERRATIA MARCESCENS 04/04/2022     Lab Results  Component Value Date   ALBUMIN 3.3 (L) 07/30/2022   ALBUMIN 2.7 (L) 04/14/2022   ALBUMIN 2.6 (L) 04/13/2022    Lab Results  Component Value Date   MG 1.6 (L) 04/14/2022   MG 1.5 (L) 04/13/2022   MG 1.7 04/11/2022   Lab Results  Component Value Date   VD25OH 36.7 04/14/2020    No results found for: "PREALBUMIN"    Latest Ref Rng & Units 07/30/2022    3:24 PM 07/13/2022   11:30 AM 04/14/2022    5:55 AM  CBC EXTENDED  WBC 3.4 - 10.8 x10E3/uL 9.6  7.5  10.4   RBC 4.14 - 5.80 x10E6/uL 4.22  4.07  3.44   Hemoglobin 13.0 - 17.7 g/dL 12.5  12.7  10.9   HCT 37.5 - 51.0 % 38.3  37.5  31.3   Platelets 150 - 450 x10E3/uL 308  258  258   NEUT# 1.4 - 7.0 x10E3/uL  5.4  7.1   Lymph# 0.7 - 3.1 x10E3/uL  1.0  1.4      There is no height or weight on file to calculate BMI.  Orders:  No orders of the defined types were placed in this encounter.  No orders of the defined types were placed in this encounter.    Procedures: No procedures performed  Clinical Data: No additional findings.  ROS:  All other systems negative, except as noted in the HPI. Review of Systems  Objective: Vital Signs: There were no vitals taken for this visit.  Specialty Comments:  No specialty comments available.  PMFS History: Patient Active Problem List   Diagnosis Date Noted    Pulmonary hypertension ([place]) 07/13/2022   OSA (obstructive sleep apnea) 07/13/2022   Lymphedema 07/13/2022   Nonrheumatic mitral (valve) insufficiency    Aortic valve stenosis, nonrheumatic    Hypotension    Transient hypotension 04/06/2022   Bradycardia 04/06/2022   (HFpEF) heart failure with preserved ejection fraction ([place]) 04/06/2022   Abscess of leg, left 04/04/2022   Cellulitis of left lower extremity 03/02/2022   Chronic diastolic CHF (congestive heart failure) ([place]) 03/02/2022   Cellulitis of left leg 03/02/2022   Foot callus 12/15/2021   Other secondary pulmonary hypertension ([place]) 08/30/2021   COPD (chronic obstructive pulmonary disease) ([place]) 08/30/2021   Coronary artery disease due to type 2 diabetes mellitus ([place]) 07/21/2020   CHF (congestive heart failure) ([place]) 07/21/2020   History of ST elevation myocardial infarction (STEMI) 04/30/2020   Atrial fibrillation ([place]) 02/29/2020   AKI (acute kidney injury) ([place]) 02/28/2020   Hypokalemia 02/28/2020   Hyponatremia 02/28/2020   Lumbar radiculopathy 03/24/2019   Morbid obesity ([place]) 09/04/2018   Lung nodules 01/17/2017   Ascending aortic aneurysm 01/17/2017   Type 2 diabetes mellitus ([place]) 03/20/2016   Tear of lateral meniscus of right knee 11/19/2014   Hypertension    Hyperlipidemia    Metabolic syndrome    Osteoarthritis of both knees 07/06/2013   Acquired spondylolisthesis 03/06/2013   Degeneration of lumbar intervertebral disc 03/06/2013   Lumbar spondylosis 03/06/2013   Spinal stenosis of lumbar region 03/06/2013   Past Medical History:  Diagnosis Date   A-fib [facility])    Acute on chronic diastolic CHF (congestive heart failure) ([place]) 07/21/2020   Coronary artery disease    Diabetes mellitus without complication (HCC)    Dyspnea    Dysrhythmia    A-fib   Hyperlipidemia    Hypertension    Metabolic syndrome    Prediabetes    Sleep apnea    STEMI (ST elevation myocardial infarction) ([place])     Family  History  Problem Relation Age of Onset   Cancer Mother        lungs   Coronary artery disease Mother    Diabetes Brother    Heart attack Brother    Diabetes Maternal Grandmother    Arthritis Father     Past Surgical History:  Procedure Laterality Date   APPENDECTOMY     CHOLECYSTECTOMY N/A 03/02/2020   Procedure: LAPAROSCOPIC CHOLECYSTECTOMY;  Surgeon: Aviva Signs, MD;  Location: [facility];  Service: General;  Laterality: N/A;   CORONARY STENT INTERVENTION N/A 04/30/2020   Procedure: CORONARY STENT INTERVENTION;  Surgeon:  Martinique, Peter M, MD;  Location: [place] [facility];  Service: Cardiovascular;  Laterality: N/A;   CORONARY/GRAFT ACUTE MI REVASCULARIZATION N/A 04/30/2020   Procedure: Coronary/Graft Acute MI Revascularization;  Surgeon: Martinique, Peter M, MD;  Location: [place] [facility];  Service: Cardiovascular;  Laterality: N/A;   Eyelid Surgery Bilateral    HERNIA REPAIR     I & D EXTREMITY Left 04/06/2022   Procedure: LEFT LEG DEBRIDEMENT;  Surgeon: Newt Minion, MD;  Location: [place];  Service: Orthopedics;  Laterality: Left;   I & D EXTREMITY Left 04/04/2022   Procedure: LEFT LEG DEBRIDEMENT;  Surgeon: Newt Minion, MD;  Location: [facility];  Service: Orthopedics;  Laterality: Left;   knee tendons repair     LEFT HEART CATH AND CORONARY ANGIOGRAPHY N/A 04/30/2020   Procedure: LEFT HEART CATH AND CORONARY ANGIOGRAPHY;  Surgeon: Martinique, Peter M, MD;  Location: [facility];  Service: Cardiovascular;  Laterality: N/A;   ROTATOR CUFF REPAIR Bilateral    TONSILLECTOMY     Social History   Occupational History   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

## 2022-08-22 ENCOUNTER — Ambulatory Visit: Payer: Medicare Other

## 2022-08-23 ENCOUNTER — Ambulatory Visit (INDEPENDENT_AMBULATORY_CARE_PROVIDER_SITE_OTHER): Payer: Medicare Other | Admitting: Family Medicine

## 2022-08-23 ENCOUNTER — Encounter: Payer: Self-pay | Admitting: Family Medicine

## 2022-08-23 VITALS — BP 112/73 | HR 110 | Temp 97.5°F | Ht 68.0 in | Wt 319.0 lb

## 2022-08-23 DIAGNOSIS — B372 Candidiasis of skin and nail: Secondary | ICD-10-CM

## 2022-08-23 DIAGNOSIS — L03113 Cellulitis of right upper limb: Secondary | ICD-10-CM | POA: Diagnosis not present

## 2022-08-23 MED ORDER — SULFAMETHOXAZOLE-TRIMETHOPRIM 800-160 MG PO TABS: 1.0000 | ORAL_TABLET | Freq: Two times a day (BID) | ORAL | 0 refills | Status: DC

## 2022-08-23 MED ORDER — FLUCONAZOLE 150 MG PO TABS: 150.0000 mg | ORAL_TABLET | ORAL | 0 refills | Status: DC

## 2022-08-23 NOTE — Progress Notes (Signed)
BP 112/73   Pulse (!) 110   Temp (!) 97.5 F (36.4 C)   Ht '5\' 8"'$  (1.727 m)   Wt (!) 319 lb (144.7 kg)   SpO2 99%   BMI 48.50 kg/m    Subjective:   Patient ID: Ryan Garner, male    DOB: 21-Nov-1945, 77 y.o.   MRN: [id number]  HPI: Ryan Garner is a 77 y.o. male presenting on 08/23/2022 for Arm Pain (Right. Red, swelling) and Rash (Groin.)   HPI Right arm red and hot and warm Patient has noticed that he is having his right arm has gotten hot and red and warm and started a couple days ago.  He denies any fevers or chills but says his right arm is swollen and hot and red and warm and over the forearm area and very itchy and he is concerned about infection.  He said it started up a couple days ago.  Rash under pannus Patient has rash under his abdominal folds that he has been fighting for at least a few weeks.  He is using antifungal powder over-the-counter and has not noted any improvement.  It is mostly right under his right skin fold under his abdomen and it is very tender and sore and pruritic.  He has had this previously.  Relevant past medical, surgical, family and social history reviewed and updated as indicated. Interim medical history since our last visit reviewed. Allergies and medications reviewed and updated.  Review of Systems  Constitutional:  Negative for chills and fever.  Eyes:  Negative for discharge.  Respiratory:  Negative for shortness of breath and wheezing.   Cardiovascular:  Negative for chest pain and leg swelling.  Musculoskeletal:  Negative for back pain and gait problem.  Skin:  Positive for color change and rash.  All other systems reviewed and are negative.   Per HPI unless specifically indicated above   Allergies as of 08/23/2022       Reactions   Flecainide    Per patient dizziness, shortness of breath, blurred vision   Rofecoxib Rash, Other (See Comments)   (Vioxx)        Medication List        Accurate as of August 23, 2022  2:46  PM. If you have any questions, ask your nurse or doctor.          STOP taking these medications    amoxicillin-clavulanate 875-125 MG tablet Commonly known as: AUGMENTIN Stopped by: [name] Lestine Rahe, MD       TAKE these medications    albuterol 108 (90 Base) MCG/ACT inhaler Commonly known as: VENTOLIN HFA Inhale 2 puffs into the lungs every 6 (six) hours as needed for wheezing or shortness of breath.   apixaban 5 MG Tabs tablet Commonly known as: ELIQUIS Take 1 tablet (5 mg total) by mouth 2 (two) times daily.   ascorbic acid 1000 MG tablet Commonly known as: VITAMIN C Take 1 tablet (1,000 mg total) by mouth daily.   atorvastatin 80 MG tablet Commonly known as: LIPITOR Take 1 tablet by mouth once daily   clopidogrel 75 MG tablet Commonly known as: PLAVIX Take 1 tablet (75 mg total) by mouth daily with breakfast.   cyanocobalamin 1000 MCG tablet Commonly known as: VITAMIN B12 Take 1,000 mcg by mouth daily.   fluconazole 150 MG tablet Commonly known as: Diflucan Take 1 tablet (150 mg total) by mouth once a week. Started by: Worthy Rancher, MD   furosemide  80 MG tablet Commonly known as: LASIX Take 1.5 tablets (120 mg total) by mouth 2 (two) times daily.   metoprolol tartrate 25 MG tablet Commonly known as: LOPRESSOR Take 1 tablet (25 mg total) by mouth 2 (two) times daily.   multivitamin with minerals Tabs tablet Take 1 tablet by mouth daily.   nitroGLYCERIN 0.4 MG SL tablet Commonly known as: NITROSTAT Place 1 tablet (0.4 mg total) under the tongue every 5 (five) minutes x 3 doses as needed for chest pain.   nutrition supplement (JUVEN) Pack Take 1 packet by mouth 2 (two) times daily between meals.   nystatin powder Commonly known as: MYCOSTATIN/NYSTOP Apply 1 Application topically 3 (three) times daily.   potassium chloride SA 20 MEQ tablet Commonly known as: KLOR-CON M Take 2 tablets (40 mEq total) by mouth daily.   Spiriva Respimat 1.25  MCG/ACT Aers Generic drug: Tiotropium Bromide Monohydrate Inhale 2 puffs into the lungs daily.   sulfamethoxazole-trimethoprim 800-160 MG tablet Commonly known as: BACTRIM DS Take 1 tablet by mouth 2 (two) times daily. Started by: [name] Aasiyah Auerbach, MD   zinc sulfate 220 (50 Zn) MG capsule Take 1 capsule (220 mg total) by mouth daily.         Objective:   BP 112/73   Pulse (!) 110   Temp (!) 97.5 F (36.4 C)   Ht '5\' 8"'$  (1.727 m)   Wt (!) 319 lb (144.7 kg)   SpO2 99%   BMI 48.50 kg/m   Wt Readings from Last 3 Encounters:  08/23/22 (!) 319 lb (144.7 kg)  07/13/22 (!) 320 lb (145.2 kg)  07/04/22 293 lb (132.9 kg)    Physical Exam Vitals and nursing note reviewed.  Constitutional:      Appearance: Normal appearance.  Abdominal:    Skin:    Findings: Erythema (Slight erythema and warmth in his right arm and most of his forearm.  No open wounds or injury to the area) present.  Neurological:     Mental Status: He is alert.       Assessment & Plan:   Problem List Items Addressed This Visit   None Visit Diagnoses     Right arm cellulitis    -  Primary   Relevant Medications   sulfamethoxazole-trimethoprim (BACTRIM DS) 800-160 MG tablet   Yeast dermatitis       Relevant Medications   fluconazole (DIFLUCAN) 150 MG tablet   sulfamethoxazole-trimethoprim (BACTRIM DS) 800-160 MG tablet       Gave Diflucan, continue to use the antifungal powder and this should help him get over it.  Also recommended to make sure he cleans every day with soap and water and then pat dry once a day.  For right arm cellulitis gave Bactrim. Follow up plan: Return if symptoms worsen or fail to improve.  Counseling provided for all of the vaccine components No orders of the defined types were placed in this encounter.   Ryan Pina, MD [facility] 08/23/2022, 2:46 PM

## 2022-08-27 DIAGNOSIS — N189 Chronic kidney disease, unspecified: Secondary | ICD-10-CM | POA: Diagnosis not present

## 2022-08-27 DIAGNOSIS — E1122 Type 2 diabetes mellitus with diabetic chronic kidney disease: Secondary | ICD-10-CM | POA: Diagnosis not present

## 2022-08-27 DIAGNOSIS — I130 Hypertensive heart and chronic kidney disease with heart failure and stage 1 through stage 4 chronic kidney disease, or unspecified chronic kidney disease: Secondary | ICD-10-CM | POA: Diagnosis not present

## 2022-08-27 DIAGNOSIS — N179 Acute kidney failure, unspecified: Secondary | ICD-10-CM | POA: Diagnosis not present

## 2022-08-27 DIAGNOSIS — I50813 Acute on chronic right heart failure: Secondary | ICD-10-CM | POA: Diagnosis not present

## 2022-08-27 DIAGNOSIS — E1151 Type 2 diabetes mellitus with diabetic peripheral angiopathy without gangrene: Secondary | ICD-10-CM | POA: Diagnosis not present

## 2022-08-30 ENCOUNTER — Telehealth: Payer: Self-pay | Admitting: *Deleted

## 2022-08-30 DIAGNOSIS — I50813 Acute on chronic right heart failure: Secondary | ICD-10-CM | POA: Diagnosis not present

## 2022-08-30 DIAGNOSIS — E1122 Type 2 diabetes mellitus with diabetic chronic kidney disease: Secondary | ICD-10-CM | POA: Diagnosis not present

## 2022-08-30 DIAGNOSIS — I130 Hypertensive heart and chronic kidney disease with heart failure and stage 1 through stage 4 chronic kidney disease, or unspecified chronic kidney disease: Secondary | ICD-10-CM | POA: Diagnosis not present

## 2022-08-30 DIAGNOSIS — N179 Acute kidney failure, unspecified: Secondary | ICD-10-CM | POA: Diagnosis not present

## 2022-08-30 DIAGNOSIS — E1151 Type 2 diabetes mellitus with diabetic peripheral angiopathy without gangrene: Secondary | ICD-10-CM | POA: Diagnosis not present

## 2022-08-30 DIAGNOSIS — N189 Chronic kidney disease, unspecified: Secondary | ICD-10-CM | POA: Diagnosis not present

## 2022-08-30 NOTE — Telephone Encounter (Signed)
Called to notify pt that we received his patient assistance application for Eliquis and we will need his proof of income.Pt voiced understanding.

## 2022-09-04 ENCOUNTER — Encounter: Payer: Self-pay | Admitting: Orthopedic Surgery

## 2022-09-04 ENCOUNTER — Ambulatory Visit: Payer: Medicare Other | Attending: Internal Medicine

## 2022-09-04 ENCOUNTER — Ambulatory Visit (INDEPENDENT_AMBULATORY_CARE_PROVIDER_SITE_OTHER): Payer: Medicare Other | Admitting: Orthopedic Surgery

## 2022-09-04 DIAGNOSIS — L97921 Non-pressure chronic ulcer of unspecified part of left lower leg limited to breakdown of skin: Secondary | ICD-10-CM

## 2022-09-04 DIAGNOSIS — I890 Lymphedema, not elsewhere classified: Secondary | ICD-10-CM

## 2022-09-04 DIAGNOSIS — I340 Nonrheumatic mitral (valve) insufficiency: Secondary | ICD-10-CM

## 2022-09-04 DIAGNOSIS — Z79899 Other long term (current) drug therapy: Secondary | ICD-10-CM

## 2022-09-04 DIAGNOSIS — R0602 Shortness of breath: Secondary | ICD-10-CM | POA: Diagnosis not present

## 2022-09-04 DIAGNOSIS — I5033 Acute on chronic diastolic (congestive) heart failure: Secondary | ICD-10-CM | POA: Diagnosis not present

## 2022-09-04 DIAGNOSIS — I5032 Chronic diastolic (congestive) heart failure: Secondary | ICD-10-CM

## 2022-09-04 NOTE — Progress Notes (Signed)
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  Patient has been using Mycostatin powder on the fungal rash on the abdomen.  He underwent a demo today with the Flexitouch lymphedema pumps.  Assessment & Plan: Visit Diagnoses:  1. Lymphedema of both lower extremities   2. Nonhealing ulcer of left lower leg limited to breakdown of skin [facility])     Plan: Anticipate patient will benefit from the Flexitouch lymphedema pumps.  Patient will continue with the current wound care.  Follow-Up Instructions: No follow-ups on file.   Ortho Exam  Patient is alert, oriented, no adenopathy, well-dressed, normal affect, normal respiratory effort. Examination the left leg the proximal ulcer is scabbed over no cellulitis.  He has 2 distal venous stasis ulcers on the left leg which are superficial.  Measurements today show a head circumference of 152 cm.  On November 7 they were 145 cm.  Imaging: No results found. No images are attached to the encounter.  Labs: Lab Results  Component Value Date   HGBA1C 7.0 (H) 12/15/2021   HGBA1C 6.4 (H) 04/28/2021   HGBA1C 6.7 10/27/2020   REPTSTATUS 04/11/2022 FINAL 04/04/2022   GRAMSTAIN NO WBC SEEN NO ORGANISMS SEEN  04/04/2022   CULT  04/04/2022    RARE SERRATIA MARCESCENS NO ANAEROBES ISOLATED Performed at [facility], [place] [address], [place], [place] [address]    LABORGA SERRATIA MARCESCENS 04/04/2022     Lab Results  Component Value Date   ALBUMIN 3.3 (L) 07/30/2022   ALBUMIN 2.7 (L) 04/14/2022   ALBUMIN 2.6 (L) 04/13/2022    Lab  Results  Component Value Date   MG 1.6 (L) 04/14/2022   MG 1.5 (L) 04/13/2022   MG 1.7 04/11/2022   Lab Results  Component Value Date   VD25OH 36.7 04/14/2020    No results found for: "PREALBUMIN"    Latest Ref Rng & Units 07/30/2022    3:24 PM 07/13/2022   11:30 AM 04/14/2022    5:55 AM  CBC EXTENDED  WBC 3.4 - 10.8 x10E3/uL 9.6  7.5  10.4   RBC 4.14 - 5.80 x10E6/uL 4.22  4.07  3.44   Hemoglobin 13.0 - 17.7 g/dL 12.5  12.7  10.9   HCT 37.5 - 51.0 % 38.3  37.5  31.3   Platelets 150 - 450 x10E3/uL 308  258  258   NEUT# 1.4 - 7.0 x10E3/uL  5.4  7.1   Lymph# 0.7 - 3.1 x10E3/uL  1.0  1.4      There is no height or weight on file to calculate BMI.  Orders:  No orders of the defined types were placed in this encounter.  No orders of the defined types were placed in this encounter.    Procedures: No procedures performed  Clinical Data: No additional findings.  ROS:  All other systems negative, except as noted in the HPI. Review of Systems  Objective: Vital Signs:  There were no vitals taken for this visit.  Specialty Comments:  No specialty comments available.  PMFS History: Patient Active Problem List   Diagnosis Date Noted   Pulmonary hypertension ([place]) 07/13/2022   OSA (obstructive sleep apnea) 07/13/2022   Lymphedema 07/13/2022   Nonrheumatic mitral (valve) insufficiency    Aortic valve stenosis, nonrheumatic    Hypotension    Transient hypotension 04/06/2022   Bradycardia 04/06/2022   (HFpEF) heart failure with preserved ejection fraction ([place]) 04/06/2022   Abscess of leg, left 04/04/2022   Cellulitis of left lower extremity 03/02/2022   Chronic diastolic CHF (congestive heart failure) ([place]) 03/02/2022   Cellulitis of left leg 03/02/2022   Foot callus 12/15/2021   Other secondary pulmonary hypertension ([place]) 08/30/2021   COPD (chronic obstructive pulmonary disease) ([place]) 08/30/2021   Coronary artery disease due to type 2 diabetes mellitus ([place])  07/21/2020   CHF (congestive heart failure) ([place]) 07/21/2020   History of ST elevation myocardial infarction (STEMI) 04/30/2020   Atrial fibrillation ([place]) 02/29/2020   AKI (acute kidney injury) ([place]) 02/28/2020   Hypokalemia 02/28/2020   Hyponatremia 02/28/2020   Lumbar radiculopathy 03/24/2019   Morbid obesity ([place]) 09/04/2018   Lung nodules 01/17/2017   Ascending aortic aneurysm 01/17/2017   Type 2 diabetes mellitus ([place]) 03/20/2016   Tear of lateral meniscus of right knee 11/19/2014   Hypertension    Hyperlipidemia    Metabolic syndrome    Osteoarthritis of both knees 07/06/2013   Acquired spondylolisthesis 03/06/2013   Degeneration of lumbar intervertebral disc 03/06/2013   Lumbar spondylosis 03/06/2013   Spinal stenosis of lumbar region 03/06/2013   Past Medical History:  Diagnosis Date   A-fib [facility])    Acute on chronic diastolic CHF (congestive heart failure) ([place]) 07/21/2020   Coronary artery disease    Diabetes mellitus without complication (HCC)    Dyspnea    Dysrhythmia    A-fib   Hyperlipidemia    Hypertension    Metabolic syndrome    Prediabetes    Sleep apnea    STEMI (ST elevation myocardial infarction) ([place])     Family History  Problem Relation Age of Onset   Cancer Mother        lungs   Coronary artery disease Mother    Diabetes Brother    Heart attack Brother    Diabetes Maternal Grandmother    Arthritis Father     Past Surgical History:  Procedure Laterality Date   APPENDECTOMY     CHOLECYSTECTOMY N/A 03/02/2020   Procedure: LAPAROSCOPIC CHOLECYSTECTOMY;  Surgeon: Aviva Signs, MD;  Location: [facility];  Service: General;  Laterality: N/A;   CORONARY STENT INTERVENTION N/A 04/30/2020   Procedure: CORONARY STENT INTERVENTION;  Surgeon: Martinique, Peter M, MD;  Location: [place] [facility];  Service: Cardiovascular;  Laterality: N/A;   CORONARY/GRAFT ACUTE MI REVASCULARIZATION N/A 04/30/2020   Procedure: Coronary/Graft Acute MI Revascularization;   Surgeon: Martinique, Peter M, MD;  Location: [place] [facility];  Service: Cardiovascular;  Laterality: N/A;   Eyelid Surgery Bilateral    HERNIA REPAIR     I & D EXTREMITY Left 04/06/2022   Procedure: LEFT LEG DEBRIDEMENT;  Surgeon: Newt Minion, MD;  Location: [place];  Service: Orthopedics;  Laterality: Left;   I & D EXTREMITY Left 04/04/2022   Procedure: LEFT LEG DEBRIDEMENT;  Surgeon: Newt Minion, MD;  Location: [place];  Service: Orthopedics;  Laterality: Left;   knee tendons repair     LEFT HEART CATH AND  CORONARY ANGIOGRAPHY N/A 04/30/2020   Procedure: LEFT HEART CATH AND CORONARY ANGIOGRAPHY;  Surgeon: Martinique, Peter M, MD;  Location: [place] [facility];  Service: Cardiovascular;  Laterality: N/A;   ROTATOR CUFF REPAIR Bilateral    TONSILLECTOMY     Social History   Occupational History   Occupation: Theme park manager    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

## 2022-09-05 ENCOUNTER — Other Ambulatory Visit: Payer: Self-pay | Admitting: Family

## 2022-09-05 DIAGNOSIS — E1122 Type 2 diabetes mellitus with diabetic chronic kidney disease: Secondary | ICD-10-CM | POA: Diagnosis not present

## 2022-09-05 DIAGNOSIS — E1151 Type 2 diabetes mellitus with diabetic peripheral angiopathy without gangrene: Secondary | ICD-10-CM | POA: Diagnosis not present

## 2022-09-05 DIAGNOSIS — N189 Chronic kidney disease, unspecified: Secondary | ICD-10-CM | POA: Diagnosis not present

## 2022-09-05 DIAGNOSIS — I130 Hypertensive heart and chronic kidney disease with heart failure and stage 1 through stage 4 chronic kidney disease, or unspecified chronic kidney disease: Secondary | ICD-10-CM | POA: Diagnosis not present

## 2022-09-05 DIAGNOSIS — I50813 Acute on chronic right heart failure: Secondary | ICD-10-CM | POA: Diagnosis not present

## 2022-09-05 DIAGNOSIS — N179 Acute kidney failure, unspecified: Secondary | ICD-10-CM | POA: Diagnosis not present

## 2022-09-05 LAB — BASIC METABOLIC PANEL
BUN/Creatinine Ratio: 32 — ABNORMAL HIGH (ref 10–24)
BUN: 61 mg/dL — ABNORMAL HIGH (ref 8–27)
CO2: 20 mmol/L (ref 20–29)
Calcium: 9.6 mg/dL (ref 8.6–10.2)
Chloride: 93 mmol/L — ABNORMAL LOW (ref 96–106)
Creatinine, Ser: 1.88 mg/dL — ABNORMAL HIGH (ref 0.76–1.27)
Glucose: 103 mg/dL — ABNORMAL HIGH (ref 70–99)
Potassium: 4.8 mmol/L (ref 3.5–5.2)
Sodium: 132 mmol/L — ABNORMAL LOW (ref 134–144)
eGFR: 36 mL/min/{1.73_m2} — ABNORMAL LOW (ref >59)

## 2022-09-05 LAB — PRO B NATRIURETIC PEPTIDE: NT-Pro BNP: 11525 pg/mL — ABNORMAL HIGH (ref 0–486)

## 2022-09-05 MED ORDER — VITAMIN C 1000 MG PO TABS: 1000.0000 mg | ORAL_TABLET | Freq: Every day | ORAL | 3 refills | Status: DC

## 2022-09-06 ENCOUNTER — Other Ambulatory Visit: Payer: Medicare Other

## 2022-09-11 ENCOUNTER — Telehealth: Payer: Self-pay | Admitting: *Deleted

## 2022-09-11 ENCOUNTER — Telehealth: Payer: Self-pay

## 2022-09-11 ENCOUNTER — Encounter: Payer: Self-pay | Admitting: Internal Medicine

## 2022-09-11 ENCOUNTER — Encounter (HOSPITAL_COMMUNITY): Payer: Self-pay

## 2022-09-11 ENCOUNTER — Ambulatory Visit (INDEPENDENT_AMBULATORY_CARE_PROVIDER_SITE_OTHER): Payer: Medicare Other | Admitting: Internal Medicine

## 2022-09-11 VITALS — BP 102/72 | HR 80 | Ht 68.0 in | Wt 310.4 lb

## 2022-09-11 DIAGNOSIS — I350 Nonrheumatic aortic (valve) stenosis: Secondary | ICD-10-CM | POA: Diagnosis present

## 2022-09-11 DIAGNOSIS — I5032 Chronic diastolic (congestive) heart failure: Secondary | ICD-10-CM | POA: Diagnosis not present

## 2022-09-11 DIAGNOSIS — N281 Cyst of kidney, acquired: Secondary | ICD-10-CM | POA: Diagnosis not present

## 2022-09-11 DIAGNOSIS — I083 Combined rheumatic disorders of mitral, aortic and tricuspid valves: Secondary | ICD-10-CM | POA: Diagnosis not present

## 2022-09-11 DIAGNOSIS — N183 Chronic kidney disease, stage 3 unspecified: Secondary | ICD-10-CM | POA: Diagnosis not present

## 2022-09-11 DIAGNOSIS — J90 Pleural effusion, not elsewhere classified: Secondary | ICD-10-CM | POA: Diagnosis not present

## 2022-09-11 DIAGNOSIS — K683 Retroperitoneal hematoma: Secondary | ICD-10-CM | POA: Diagnosis not present

## 2022-09-11 DIAGNOSIS — R059 Cough, unspecified: Secondary | ICD-10-CM | POA: Diagnosis not present

## 2022-09-11 DIAGNOSIS — Z66 Do not resuscitate: Secondary | ICD-10-CM | POA: Diagnosis present

## 2022-09-11 DIAGNOSIS — I2721 Secondary pulmonary arterial hypertension: Secondary | ICD-10-CM | POA: Diagnosis not present

## 2022-09-11 DIAGNOSIS — I2510 Atherosclerotic heart disease of native coronary artery without angina pectoris: Secondary | ICD-10-CM | POA: Diagnosis not present

## 2022-09-11 DIAGNOSIS — E119 Type 2 diabetes mellitus without complications: Secondary | ICD-10-CM | POA: Diagnosis not present

## 2022-09-11 DIAGNOSIS — R0602 Shortness of breath: Secondary | ICD-10-CM

## 2022-09-11 DIAGNOSIS — Z1152 Encounter for screening for COVID-19: Secondary | ICD-10-CM | POA: Diagnosis not present

## 2022-09-11 DIAGNOSIS — Z95818 Presence of other cardiac implants and grafts: Secondary | ICD-10-CM | POA: Diagnosis not present

## 2022-09-11 DIAGNOSIS — I340 Nonrheumatic mitral (valve) insufficiency: Secondary | ICD-10-CM | POA: Diagnosis not present

## 2022-09-11 DIAGNOSIS — J811 Chronic pulmonary edema: Secondary | ICD-10-CM | POA: Diagnosis not present

## 2022-09-11 DIAGNOSIS — Z87891 Personal history of nicotine dependence: Secondary | ICD-10-CM | POA: Diagnosis not present

## 2022-09-11 DIAGNOSIS — E1122 Type 2 diabetes mellitus with diabetic chronic kidney disease: Secondary | ICD-10-CM | POA: Diagnosis not present

## 2022-09-11 DIAGNOSIS — Z79899 Other long term (current) drug therapy: Secondary | ICD-10-CM | POA: Diagnosis not present

## 2022-09-11 DIAGNOSIS — E785 Hyperlipidemia, unspecified: Secondary | ICD-10-CM | POA: Diagnosis present

## 2022-09-11 DIAGNOSIS — R0600 Dyspnea, unspecified: Secondary | ICD-10-CM | POA: Diagnosis present

## 2022-09-11 DIAGNOSIS — L97822 Non-pressure chronic ulcer of other part of left lower leg with fat layer exposed: Secondary | ICD-10-CM | POA: Diagnosis present

## 2022-09-11 DIAGNOSIS — G4733 Obstructive sleep apnea (adult) (pediatric): Secondary | ICD-10-CM | POA: Diagnosis not present

## 2022-09-11 DIAGNOSIS — Z006 Encounter for examination for normal comparison and control in clinical research program: Secondary | ICD-10-CM | POA: Diagnosis not present

## 2022-09-11 DIAGNOSIS — L0390 Cellulitis, unspecified: Secondary | ICD-10-CM | POA: Diagnosis not present

## 2022-09-11 DIAGNOSIS — Y838 Other surgical procedures as the cause of abnormal reaction of the patient, or of later complication, without mention of misadventure at the time of the procedure: Secondary | ICD-10-CM | POA: Diagnosis not present

## 2022-09-11 DIAGNOSIS — E1149 Type 2 diabetes mellitus with other diabetic neurological complication: Secondary | ICD-10-CM | POA: Diagnosis not present

## 2022-09-11 DIAGNOSIS — I509 Heart failure, unspecified: Secondary | ICD-10-CM | POA: Diagnosis not present

## 2022-09-11 DIAGNOSIS — N1831 Chronic kidney disease, stage 3a: Secondary | ICD-10-CM | POA: Diagnosis present

## 2022-09-11 DIAGNOSIS — Z9989 Dependence on other enabling machines and devices: Secondary | ICD-10-CM | POA: Diagnosis not present

## 2022-09-11 DIAGNOSIS — I959 Hypotension, unspecified: Secondary | ICD-10-CM | POA: Diagnosis not present

## 2022-09-11 DIAGNOSIS — Z9889 Other specified postprocedural states: Secondary | ICD-10-CM | POA: Diagnosis not present

## 2022-09-11 DIAGNOSIS — I2722 Pulmonary hypertension due to left heart disease: Secondary | ICD-10-CM | POA: Diagnosis not present

## 2022-09-11 DIAGNOSIS — I110 Hypertensive heart disease with heart failure: Secondary | ICD-10-CM | POA: Diagnosis not present

## 2022-09-11 DIAGNOSIS — D62 Acute posthemorrhagic anemia: Secondary | ICD-10-CM | POA: Diagnosis not present

## 2022-09-11 DIAGNOSIS — I5033 Acute on chronic diastolic (congestive) heart failure: Secondary | ICD-10-CM

## 2022-09-11 DIAGNOSIS — I359 Nonrheumatic aortic valve disorder, unspecified: Secondary | ICD-10-CM | POA: Diagnosis not present

## 2022-09-11 DIAGNOSIS — Z955 Presence of coronary angioplasty implant and graft: Secondary | ICD-10-CM | POA: Diagnosis not present

## 2022-09-11 DIAGNOSIS — N179 Acute kidney failure, unspecified: Secondary | ICD-10-CM | POA: Diagnosis not present

## 2022-09-11 DIAGNOSIS — T82838A Hemorrhage of vascular prosthetic devices, implants and grafts, initial encounter: Secondary | ICD-10-CM | POA: Diagnosis not present

## 2022-09-11 DIAGNOSIS — T45515A Adverse effect of anticoagulants, initial encounter: Secondary | ICD-10-CM | POA: Diagnosis not present

## 2022-09-11 DIAGNOSIS — I252 Old myocardial infarction: Secondary | ICD-10-CM | POA: Diagnosis not present

## 2022-09-11 DIAGNOSIS — I080 Rheumatic disorders of both mitral and aortic valves: Secondary | ICD-10-CM | POA: Diagnosis not present

## 2022-09-11 DIAGNOSIS — Z954 Presence of other heart-valve replacement: Secondary | ICD-10-CM | POA: Diagnosis not present

## 2022-09-11 DIAGNOSIS — K5730 Diverticulosis of large intestine without perforation or abscess without bleeding: Secondary | ICD-10-CM | POA: Diagnosis not present

## 2022-09-11 DIAGNOSIS — J449 Chronic obstructive pulmonary disease, unspecified: Secondary | ICD-10-CM | POA: Diagnosis not present

## 2022-09-11 DIAGNOSIS — I77810 Thoracic aortic ectasia: Secondary | ICD-10-CM | POA: Diagnosis present

## 2022-09-11 DIAGNOSIS — Z6841 Body Mass Index (BMI) 40.0 and over, adult: Secondary | ICD-10-CM | POA: Diagnosis not present

## 2022-09-11 DIAGNOSIS — J9811 Atelectasis: Secondary | ICD-10-CM | POA: Diagnosis not present

## 2022-09-11 DIAGNOSIS — K4090 Unilateral inguinal hernia, without obstruction or gangrene, not specified as recurrent: Secondary | ICD-10-CM | POA: Diagnosis not present

## 2022-09-11 DIAGNOSIS — I130 Hypertensive heart and chronic kidney disease with heart failure and stage 1 through stage 4 chronic kidney disease, or unspecified chronic kidney disease: Secondary | ICD-10-CM | POA: Diagnosis not present

## 2022-09-11 DIAGNOSIS — I4821 Permanent atrial fibrillation: Secondary | ICD-10-CM | POA: Diagnosis not present

## 2022-09-11 NOTE — Progress Notes (Signed)
ID:  Ryan Garner, DOB 27-Oct-1945, MRN [id number]  PCP:  [name], [name], MD  Cardiologist:   Dorris Carnes, MD   Pt presents for follow up of CHF  History of Present Illness: Ryan Garner is a 77 y.o. male with a history of HTN, HL, pre DM, morbid obesity, atrial fibrillation and CAD.  He was admitted to [facility] on 04/30/20 with CP and STEMI.  Underwent cath with PTCA/DES of LCx    Flecanide stopped  He was seen by Beckie Salts (EP  plan for rate control and anticoagulatoin  Not a candidate for Tikosyn.  Amiodarone also not felt to be a good choice.    Echo in Nov 2022 MR appeared mild to moderate I saw the pt in clinic in Feb 2023   He was hosp x 2 for leg infection requiring debridement    During last hopsitalizion he required transient rescuscitation due to bradycardia     Echo in September 2023 MR appear severe  Cardiology was consulted   Recomm medical Rx (diuresis)   Once infection stabilized recomm R/L heart cath and TEE to evaluate MR further      I saw the pt in Jan 2024  He was feeling bad.   SOB  Some greenish sputum  Continue edema   Called PCP from parking lot prior to visit and given ABX rx.    Pt did not want to go to the hospital   Labs checked NT ProBNP was 18,000  DOD recomm increasing lasix to 120 bid     Repeat labs on 08/15/22 NT ProBNP 5000  Cr 1.06   Recomm stay on same regimen    Pt noted breathing improved some   Repeat labs on 2/13 Cr 1.88  NT pro BNP 11,525  Pt told to go back on lasix dose  He presents for follow up today   Says his breathing is worse   Having whitish sputum   NO CP   Weak    No Fever   Sleeping a lot    Allergies:   Flecainide and Rofecoxib   Past Medical History:  Diagnosis Date   A-fib ([place])    Acute on chronic diastolic CHF (congestive heart failure) ([place]) 07/21/2020   Coronary artery disease    Diabetes mellitus without complication (HCC)    Dyspnea    Dysrhythmia    A-fib   Hyperlipidemia    Hypertension    Metabolic syndrome     Prediabetes    Sleep apnea    STEMI (ST elevation myocardial infarction) [facility])     Past Surgical History:  Procedure Laterality Date   APPENDECTOMY     CHOLECYSTECTOMY N/A 03/02/2020   Procedure: LAPAROSCOPIC CHOLECYSTECTOMY;  Surgeon: Aviva Signs, MD;  Location: [facility];  Service: General;  Laterality: N/A;   CORONARY STENT INTERVENTION N/A 04/30/2020   Procedure: CORONARY STENT INTERVENTION;  Surgeon: Martinique, Peter M, MD;  Location: [place] CV LAB;  Service: Cardiovascular;  Laterality: N/A;   CORONARY/GRAFT ACUTE MI REVASCULARIZATION N/A 04/30/2020   Procedure: Coronary/Graft Acute MI Revascularization;  Surgeon: Martinique, Peter M, MD;  Location: [place] [facility] LAB;  Service: Cardiovascular;  Laterality: N/A;   Eyelid Surgery Bilateral    HERNIA REPAIR     I & D EXTREMITY Left 04/06/2022   Procedure: LEFT LEG DEBRIDEMENT;  Surgeon: Newt Minion, MD;  Location: [place];  Service: Orthopedics;  Laterality: Left;   I & D EXTREMITY Left 04/04/2022  Procedure: LEFT LEG DEBRIDEMENT;  Surgeon: Newt Minion, MD;  Location: [place];  Service: Orthopedics;  Laterality: Left;   knee tendons repair     LEFT HEART CATH AND CORONARY ANGIOGRAPHY N/A 04/30/2020   Procedure: LEFT HEART CATH AND CORONARY ANGIOGRAPHY;  Surgeon: Martinique, Peter M, MD;  Location: [place] [facility] LAB;  Service: Cardiovascular;  Laterality: N/A;   ROTATOR CUFF REPAIR Bilateral    TONSILLECTOMY       Social History:  The patient  reports that he quit smoking about 63 years ago. His smoking use included cigarettes. He has never used smokeless tobacco. He reports that he does not drink alcohol and does not use drugs.   Family History:  The patient's family history includes Arthritis in his father; Cancer in his mother; Coronary artery disease in his mother; Diabetes in his brother and maternal grandmother; Heart attack in his brother.    ROS:  Please see the history of present illness. All other systems are reviewed and   Negative to the above problem except as noted.    PHYSICAL EXAM: VS:  BP 102/72   Pulse 80   Ht 5' 8"$  (1.727 m)   Wt (!) 310 lb 6.4 oz (140.8 kg)   SpO2 98%   BMI 47.20 kg/m   GEN: Morbidly obese 77 yo   Examined in chair HEENT: normal  Neck: Neck is full  JVP is increased Cardiac: Irreg irreg ; no murmur   2 + LE edema Bandage L leg   Respiratory: Rhonchi, wheeze   GI: soft, obese  Nontender  MS: no deformity Moving all extremities   Skin: warm and dry,  Neuro:  Grossly normal   Psych: euthymic mood, full affect   EKG:  EKG is not done today    Sept 21/23 Very limited study to evaluated mitral regurgitation. Severe mitral valve regurgitation appears to be related to restricted PMVL. Jet is posteriorly directed. Unchanged from prior. The mitral valve is abnormal. Severe mitral valve regurgitation. No evidence of mitral stenosis. 1. Left ventricular ejection fraction, by estimation, is 50 to 55%. The left ventricle has low normal function. The left ventricle demonstrates global hypokinesis. There is the interventricular septum is flattened in systole and diastole, consistent with right ventricular pressure and volume overload. 2. Right ventricular systolic function was not well visualized. The right ventricular size is not well visualized. 3. Comparison(s): No significant change from prior study.  Sept 2023  Left ventricular ejection fraction, by estimation, is 50 to 55%. The left ventricle has mildly decreased function. The left ventricle has no regional wall motion abnormalities. There is mild concentric left ventricular hypertrophy. Left ventricular diastolic function could not be evaluated. There is the interventricular septum is flattened in systole and diastole, consistent with right ventricular pressure and volume overload. 1. Right ventricular systolic function is moderately reduced. The right ventricular size is moderately enlarged. There is severely  elevated pulmonary artery systolic pressure. The estimated right ventricular systolic pressure is [id number] mmHg. 2. 3. Left atrial size was severely dilated. 4. Right atrial size was severely dilated. The mitral valve is grossly normal. Severe mitral valve regurgitation. Moderate mitral annular calcification. 5. The aortic valve is tricuspid. There is severe calcifcation of the aortic valve. There is severe thickening of the aortic valve. Aortic valve regurgitation is not visualized. Moderate aortic valve stenosis. Aortic valve mean gradient measures 17.0 mmHg. Aortic valve Vmax measures 2.71 m/s. Aortic valve acceleration time measures 120 msec. 6. Aortic dilatation noted.  There is mild dilatation of the ascending aorta, measuring 41 mm. There is mild dilatation of the aortic root, measuring 39 mm. Cardiac Cath 10/9    Cath: 04/30/20   Ramus lesion is 75% stenosed. Mid Cx lesion is 99% stenosed. Post intervention, there is a 0% residual stenosis. A drug-eluting stent was successfully placed using a STENT RESOLUTE ONYX 2.5X34. The left ventricular systolic function is normal. LV end diastolic pressure is mildly elevated. The left ventricular ejection fraction is 55-65% by visual estimate.   1. 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 Good LV systolic function 3. Elevated LVEDP 4. Successful PCI of the mid to distal LCx with DES x 1.   Plan: ASA 81 mg daily for one month. Plavix 75 mg daily for one year. May resume Eliquis tomorrow. IV diuresis. Will need to stop Flecainide. Consider alternative AAD therapy.    Lipid Panel    Component Value Date/Time   CHOL 92 (L) 12/15/2021 1259   TRIG 60 12/15/2021 1259   TRIG 74 08/27/2014 0826   HDL 35 (L) 12/15/2021 1259   HDL 56 08/27/2014 0826   CHOLHDL 2.6 12/15/2021 1259   CHOLHDL 4.1 05/01/2020 1759   VLDL 13 05/01/2020 1759   LDLCALC 43 12/15/2021 1259   LDLCALC 78 11/13/2013 0911      Wt Readings  from Last 3 Encounters:  09/11/22 (!) 310 lb 6.4 oz (140.8 kg)  08/23/22 (!) 319 lb (144.7 kg)  07/13/22 (!) 320 lb (145.2 kg)      ASSESSMENT AND PLAN:  1  HFpEF  Pt with marked dyspnea, marked volume increase   I have reviewed echo images with pt and his son today    With renal function it is diffuclt to diurese as outpt. I would reocmm admit for optimization of volume with IV lasix, close follow up of renal function When he improves some I would sched for TEE and probable R/L heart cath (as recomm in Sept 2023)     2   Mitral regurgitation.   Severe on echo in Sept 2023   [place] for TEE  3  Atrial fibrillation  Permanent   Not a good candidate for antiarrhythmic   Rate control and anticoagulation   4    CAD   p Hx of NSTEMI in Oct 2021 with intervention to LCx    5.  HL Continue  atorvastatin.    6  CKD   Recent labs show bump in Cr  Will repeat labs today       Plan to contact admissions and organize a bed for the patient at [facility]  keep on same meds for now      Current medicines are reviewed at length with the patient today.  The patient does not have concerns regarding medicines.  Signed, Dorris Carnes, MD  09/11/2022 3:06 PM    [facility] [place], [place], [place]  [address] Phone: [phone]; Fax: [phone]

## 2022-09-11 NOTE — Patient Instructions (Signed)
Medication Instructions:   *If you need a refill on your cardiac medications before your next appointment, please call your pharmacy*   Lab Work: CMET, CBC, PRO BNP  If you have labs (blood work) drawn today and your tests are completely normal, you will receive your results only by: [place] (if you have MyChart) OR A paper copy in the mail If you have any lab test that is abnormal or we need to change your treatment, we will call you to review the results.   Testing/Procedures:    Follow-Up: At [facility], you and your health needs are our priority.  As part of our continuing mission to provide you with exceptional heart care, we have created designated Provider Care Teams.  These Care Teams include your primary Cardiologist (physician) and Advanced Practice Providers (APPs -  Physician Assistants and Nurse Practitioners) who all work together to provide you with the care you need, when you need it.  We recommend signing up for the patient portal called "MyChart".  Sign up information is provided on this After Visit Summary.  MyChart is used to connect with patients for Virtual Visits (Telemedicine).  Patients are able to view lab/test results, encounter notes, upcoming appointments, etc.  Non-urgent messages can be sent to your provider as well.   To learn more about what you can do with MyChart, go to [email or web address].

## 2022-09-11 NOTE — Telephone Encounter (Signed)
Pt was approved for the [employer] patient assistance program from 09/06/22 until 07/23/23.

## 2022-09-11 NOTE — Telephone Encounter (Signed)
Spoke with bed control and put him on the list for a Tele bed tomorrow... they will call him at home tomorrow when his room is ready... labs drawn today and need to put him on the inpatient TEE schedule for Friday 09/14/22 after he has a bed number.

## 2022-09-12 ENCOUNTER — Telehealth: Payer: Self-pay | Admitting: Internal Medicine

## 2022-09-12 ENCOUNTER — Inpatient Hospital Stay (HOSPITAL_COMMUNITY)
Admission: RE | Admit: 2022-09-12 | Discharge: 2022-10-11 | DRG: 266 | Disposition: A | Discharge status: Home Health | Payer: Medicare Other | Source: Ambulatory Visit | Attending: Internal Medicine | Admitting: Internal Medicine

## 2022-09-12 ENCOUNTER — Telehealth: Payer: Self-pay

## 2022-09-12 DIAGNOSIS — T82838A Hemorrhage of vascular prosthetic devices, implants and grafts, initial encounter: Secondary | ICD-10-CM | POA: Diagnosis not present

## 2022-09-12 DIAGNOSIS — Z87891 Personal history of nicotine dependence: Secondary | ICD-10-CM

## 2022-09-12 DIAGNOSIS — L97822 Non-pressure chronic ulcer of other part of left lower leg with fat layer exposed: Secondary | ICD-10-CM | POA: Diagnosis present

## 2022-09-12 DIAGNOSIS — Z955 Presence of coronary angioplasty implant and graft: Secondary | ICD-10-CM

## 2022-09-12 DIAGNOSIS — Z66 Do not resuscitate: Secondary | ICD-10-CM | POA: Diagnosis present

## 2022-09-12 DIAGNOSIS — I130 Hypertensive heart and chronic kidney disease with heart failure and stage 1 through stage 4 chronic kidney disease, or unspecified chronic kidney disease: Secondary | ICD-10-CM | POA: Diagnosis present

## 2022-09-12 DIAGNOSIS — N1831 Chronic kidney disease, stage 3a: Secondary | ICD-10-CM | POA: Diagnosis present

## 2022-09-12 DIAGNOSIS — I252 Old myocardial infarction: Secondary | ICD-10-CM

## 2022-09-12 DIAGNOSIS — R0602 Shortness of breath: Secondary | ICD-10-CM | POA: Diagnosis not present

## 2022-09-12 DIAGNOSIS — Z1152 Encounter for screening for COVID-19: Secondary | ICD-10-CM

## 2022-09-12 DIAGNOSIS — J449 Chronic obstructive pulmonary disease, unspecified: Secondary | ICD-10-CM | POA: Diagnosis not present

## 2022-09-12 DIAGNOSIS — I2722 Pulmonary hypertension due to left heart disease: Secondary | ICD-10-CM | POA: Diagnosis present

## 2022-09-12 DIAGNOSIS — Z9889 Other specified postprocedural states: Secondary | ICD-10-CM | POA: Diagnosis not present

## 2022-09-12 DIAGNOSIS — I080 Rheumatic disorders of both mitral and aortic valves: Secondary | ICD-10-CM | POA: Diagnosis not present

## 2022-09-12 DIAGNOSIS — I5023 Acute on chronic systolic (congestive) heart failure: Secondary | ICD-10-CM

## 2022-09-12 DIAGNOSIS — I509 Heart failure, unspecified: Secondary | ICD-10-CM | POA: Diagnosis present

## 2022-09-12 DIAGNOSIS — T45515A Adverse effect of anticoagulants, initial encounter: Secondary | ICD-10-CM | POA: Diagnosis not present

## 2022-09-12 DIAGNOSIS — I959 Hypotension, unspecified: Secondary | ICD-10-CM | POA: Diagnosis present

## 2022-09-12 DIAGNOSIS — K683 Retroperitoneal hematoma: Secondary | ICD-10-CM | POA: Diagnosis not present

## 2022-09-12 DIAGNOSIS — R059 Cough, unspecified: Secondary | ICD-10-CM | POA: Diagnosis not present

## 2022-09-12 DIAGNOSIS — I359 Nonrheumatic aortic valve disorder, unspecified: Secondary | ICD-10-CM | POA: Diagnosis not present

## 2022-09-12 DIAGNOSIS — R0600 Dyspnea, unspecified: Secondary | ICD-10-CM | POA: Diagnosis present

## 2022-09-12 DIAGNOSIS — Z7901 Long term (current) use of anticoagulants: Secondary | ICD-10-CM

## 2022-09-12 DIAGNOSIS — Z79899 Other long term (current) drug therapy: Secondary | ICD-10-CM

## 2022-09-12 DIAGNOSIS — Z8249 Family history of ischemic heart disease and other diseases of the circulatory system: Secondary | ICD-10-CM

## 2022-09-12 DIAGNOSIS — K4090 Unilateral inguinal hernia, without obstruction or gangrene, not specified as recurrent: Secondary | ICD-10-CM | POA: Diagnosis not present

## 2022-09-12 DIAGNOSIS — Z9989 Dependence on other enabling machines and devices: Secondary | ICD-10-CM | POA: Diagnosis not present

## 2022-09-12 DIAGNOSIS — J811 Chronic pulmonary edema: Secondary | ICD-10-CM | POA: Diagnosis not present

## 2022-09-12 DIAGNOSIS — Z9049 Acquired absence of other specified parts of digestive tract: Secondary | ICD-10-CM

## 2022-09-12 DIAGNOSIS — I4821 Permanent atrial fibrillation: Secondary | ICD-10-CM | POA: Diagnosis present

## 2022-09-12 DIAGNOSIS — I350 Nonrheumatic aortic (valve) stenosis: Secondary | ICD-10-CM | POA: Diagnosis present

## 2022-09-12 DIAGNOSIS — N183 Chronic kidney disease, stage 3 unspecified: Secondary | ICD-10-CM | POA: Diagnosis not present

## 2022-09-12 DIAGNOSIS — N179 Acute kidney failure, unspecified: Secondary | ICD-10-CM | POA: Diagnosis present

## 2022-09-12 DIAGNOSIS — Y838 Other surgical procedures as the cause of abnormal reaction of the patient, or of later complication, without mention of misadventure at the time of the procedure: Secondary | ICD-10-CM | POA: Diagnosis not present

## 2022-09-12 DIAGNOSIS — Z954 Presence of other heart-valve replacement: Secondary | ICD-10-CM | POA: Diagnosis not present

## 2022-09-12 DIAGNOSIS — E1149 Type 2 diabetes mellitus with other diabetic neurological complication: Secondary | ICD-10-CM | POA: Diagnosis not present

## 2022-09-12 DIAGNOSIS — I77810 Thoracic aortic ectasia: Secondary | ICD-10-CM | POA: Diagnosis present

## 2022-09-12 DIAGNOSIS — I110 Hypertensive heart disease with heart failure: Secondary | ICD-10-CM | POA: Diagnosis not present

## 2022-09-12 DIAGNOSIS — I2510 Atherosclerotic heart disease of native coronary artery without angina pectoris: Secondary | ICD-10-CM | POA: Diagnosis present

## 2022-09-12 DIAGNOSIS — Z7902 Long term (current) use of antithrombotics/antiplatelets: Secondary | ICD-10-CM

## 2022-09-12 DIAGNOSIS — I340 Nonrheumatic mitral (valve) insufficiency: Principal | ICD-10-CM | POA: Diagnosis present

## 2022-09-12 DIAGNOSIS — E785 Hyperlipidemia, unspecified: Secondary | ICD-10-CM | POA: Diagnosis present

## 2022-09-12 DIAGNOSIS — Z006 Encounter for examination for normal comparison and control in clinical research program: Secondary | ICD-10-CM

## 2022-09-12 DIAGNOSIS — Z833 Family history of diabetes mellitus: Secondary | ICD-10-CM

## 2022-09-12 DIAGNOSIS — Z7951 Long term (current) use of inhaled steroids: Secondary | ICD-10-CM

## 2022-09-12 DIAGNOSIS — D62 Acute posthemorrhagic anemia: Secondary | ICD-10-CM | POA: Diagnosis not present

## 2022-09-12 DIAGNOSIS — Z6841 Body Mass Index (BMI) 40.0 and over, adult: Secondary | ICD-10-CM

## 2022-09-12 DIAGNOSIS — E1122 Type 2 diabetes mellitus with diabetic chronic kidney disease: Secondary | ICD-10-CM | POA: Diagnosis present

## 2022-09-12 DIAGNOSIS — I2721 Secondary pulmonary arterial hypertension: Secondary | ICD-10-CM | POA: Diagnosis present

## 2022-09-12 DIAGNOSIS — I5033 Acute on chronic diastolic (congestive) heart failure: Secondary | ICD-10-CM | POA: Diagnosis present

## 2022-09-12 DIAGNOSIS — R531 Weakness: Secondary | ICD-10-CM | POA: Diagnosis present

## 2022-09-12 DIAGNOSIS — G4733 Obstructive sleep apnea (adult) (pediatric): Secondary | ICD-10-CM | POA: Diagnosis present

## 2022-09-12 DIAGNOSIS — L0390 Cellulitis, unspecified: Secondary | ICD-10-CM | POA: Diagnosis not present

## 2022-09-12 DIAGNOSIS — E876 Hypokalemia: Secondary | ICD-10-CM | POA: Diagnosis not present

## 2022-09-12 DIAGNOSIS — I890 Lymphedema, not elsewhere classified: Secondary | ICD-10-CM | POA: Diagnosis present

## 2022-09-12 DIAGNOSIS — Z888 Allergy status to other drugs, medicaments and biological substances status: Secondary | ICD-10-CM

## 2022-09-12 DIAGNOSIS — N281 Cyst of kidney, acquired: Secondary | ICD-10-CM | POA: Diagnosis not present

## 2022-09-12 DIAGNOSIS — J9811 Atelectasis: Secondary | ICD-10-CM | POA: Diagnosis not present

## 2022-09-12 DIAGNOSIS — J90 Pleural effusion, not elsewhere classified: Secondary | ICD-10-CM | POA: Diagnosis not present

## 2022-09-12 DIAGNOSIS — Z8261 Family history of arthritis: Secondary | ICD-10-CM

## 2022-09-12 DIAGNOSIS — E119 Type 2 diabetes mellitus without complications: Secondary | ICD-10-CM | POA: Diagnosis not present

## 2022-09-12 DIAGNOSIS — Z95818 Presence of other cardiac implants and grafts: Secondary | ICD-10-CM | POA: Diagnosis not present

## 2022-09-12 DIAGNOSIS — I083 Combined rheumatic disorders of mitral, aortic and tricuspid valves: Secondary | ICD-10-CM | POA: Diagnosis not present

## 2022-09-12 DIAGNOSIS — K5730 Diverticulosis of large intestine without perforation or abscess without bleeding: Secondary | ICD-10-CM | POA: Diagnosis not present

## 2022-09-12 LAB — COMPREHENSIVE METABOLIC PANEL
ALT: 12 IU/L (ref 0–44)
ALT: 13 U/L (ref 0–44)
AST: 20 IU/L (ref 0–40)
AST: 19 U/L (ref 15–41)
Albumin/Globulin Ratio: 1.2 (ref 1.2–2.2)
Albumin: 3.6 g/dL — ABNORMAL LOW (ref 3.8–4.8)
Albumin: 3.1 g/dL — ABNORMAL LOW (ref 3.5–5.0)
Alkaline Phosphatase: 84 IU/L (ref 44–121)
Alkaline Phosphatase: 69 U/L (ref 38–126)
Anion gap: 9 (ref 5–15)
BUN/Creatinine Ratio: 38 — ABNORMAL HIGH (ref 10–24)
BUN: 49 mg/dL — ABNORMAL HIGH (ref 8–27)
BUN: 54 mg/dL — ABNORMAL HIGH (ref 8–23)
Bilirubin Total: 0.6 mg/dL (ref 0.0–1.2)
CO2: 18 mmol/L — ABNORMAL LOW (ref 20–29)
CO2: 22 mmol/L (ref 22–32)
Calcium: 9.5 mg/dL (ref 8.6–10.2)
Calcium: 9.4 mg/dL (ref 8.9–10.3)
Chloride: 95 mmol/L — ABNORMAL LOW (ref 96–106)
Chloride: 100 mmol/L (ref 98–111)
Creatinine, Ser: 1.3 mg/dL — ABNORMAL HIGH (ref 0.76–1.27)
Creatinine, Ser: 1.33 mg/dL — ABNORMAL HIGH (ref 0.61–1.24)
GFR, Estimated: 55 mL/min — ABNORMAL LOW (ref >60)
Globulin, Total: 3.1 g/dL (ref 1.5–4.5)
Glucose, Bld: 131 mg/dL — ABNORMAL HIGH (ref 70–99)
Glucose: 112 mg/dL — ABNORMAL HIGH (ref 70–99)
Potassium: 5.4 mmol/L — ABNORMAL HIGH (ref 3.5–5.2)
Potassium: 4.5 mmol/L (ref 3.5–5.1)
Sodium: 130 mmol/L — ABNORMAL LOW (ref 134–144)
Sodium: 131 mmol/L — ABNORMAL LOW (ref 135–145)
Total Bilirubin: 0.9 mg/dL (ref 0.3–1.2)
Total Protein: 6.7 g/dL (ref 6.0–8.5)
Total Protein: 6.8 g/dL (ref 6.5–8.1)
eGFR: 56 mL/min/{1.73_m2} — ABNORMAL LOW (ref >59)

## 2022-09-12 LAB — CBC WITH DIFFERENTIAL/PLATELET
Abs Immature Granulocytes: 0.03 10*3/uL (ref 0.00–0.07)
Basophils Absolute: 0 10*3/uL (ref 0.0–0.1)
Basophils Relative: 1 %
Eosinophils Absolute: 0.1 10*3/uL (ref 0.0–0.5)
Eosinophils Relative: 2 %
HCT: 36.6 % — ABNORMAL LOW (ref 39.0–52.0)
Hemoglobin: 11.8 g/dL — ABNORMAL LOW (ref 13.0–17.0)
Immature Granulocytes: 1 %
Lymphocytes Relative: 13 %
Lymphs Abs: 0.8 10*3/uL (ref 0.7–4.0)
MCH: 29.2 pg (ref 26.0–34.0)
MCHC: 32.2 g/dL (ref 30.0–36.0)
MCV: 90.6 fL (ref 80.0–100.0)
Monocytes Absolute: 0.9 10*3/uL (ref 0.1–1.0)
Monocytes Relative: 14 %
Neutro Abs: 4.4 10*3/uL (ref 1.7–7.7)
Neutrophils Relative %: 69 %
Platelets: 258 10*3/uL (ref 150–400)
RBC: 4.04 MIL/uL — ABNORMAL LOW (ref 4.22–5.81)
RDW: 16.1 % — ABNORMAL HIGH (ref 11.5–15.5)
WBC: 6.3 10*3/uL (ref 4.0–10.5)
nRBC: 0 % (ref 0.0–0.2)

## 2022-09-12 LAB — CBC
Hematocrit: 36.6 % — ABNORMAL LOW (ref 37.5–51.0)
Hemoglobin: 12.4 g/dL — ABNORMAL LOW (ref 13.0–17.7)
MCH: 29 pg (ref 26.6–33.0)
MCHC: 33.9 g/dL (ref 31.5–35.7)
MCV: 86 fL (ref 79–97)
Platelets: 282 10*3/uL (ref 150–450)
RBC: 4.27 x10E6/uL (ref 4.14–5.80)
RDW: 13.8 % (ref 11.6–15.4)
WBC: 7.6 10*3/uL (ref 3.4–10.8)

## 2022-09-12 LAB — PRO B NATRIURETIC PEPTIDE: NT-Pro BNP: 8444 pg/mL — ABNORMAL HIGH (ref 0–486)

## 2022-09-12 LAB — MAGNESIUM: Magnesium: 2 mg/dL (ref 1.7–2.4)

## 2022-09-12 LAB — BRAIN NATRIURETIC PEPTIDE: B Natriuretic Peptide: 577.3 pg/mL — ABNORMAL HIGH (ref 0.0–100.0)

## 2022-09-12 MED ORDER — SODIUM CHLORIDE 0.9 % IV SOLN: 250.0000 mL | INTRAVENOUS | Status: DC | PRN

## 2022-09-12 MED ORDER — ATORVASTATIN CALCIUM 80 MG PO TABS
80.0000 mg | ORAL_TABLET | Freq: Every day | ORAL | Status: DC
  Administered 2022-09-13 – 2022-10-11 (×28): 80 mg via ORAL
  Filled 2022-09-12 (×28): qty 1

## 2022-09-12 MED ORDER — METOPROLOL TARTRATE 25 MG PO TABS
25.0000 mg | ORAL_TABLET | Freq: Two times a day (BID) | ORAL | Status: DC
  Administered 2022-09-12: 25 mg via ORAL
  Filled 2022-09-12: qty 1

## 2022-09-12 MED ORDER — UMECLIDINIUM BROMIDE 62.5 MCG/ACT IN AEPB
1.0000 | INHALATION_SPRAY | Freq: Every day | RESPIRATORY_TRACT | Status: DC
  Administered 2022-09-13 – 2022-10-10 (×25): 1 via RESPIRATORY_TRACT
  Filled 2022-09-12 (×4): qty 7

## 2022-09-12 MED ORDER — SODIUM CHLORIDE 0.9% FLUSH
3.0000 mL | Freq: Two times a day (BID) | INTRAVENOUS | Status: DC
  Administered 2022-09-12 – 2022-10-11 (×47): 3 mL via INTRAVENOUS

## 2022-09-12 MED ORDER — ACETAMINOPHEN 325 MG PO TABS
650.0000 mg | ORAL_TABLET | ORAL | Status: DC | PRN
  Administered 2022-09-16 – 2022-10-01 (×4): 650 mg via ORAL
  Filled 2022-09-12 (×4): qty 2

## 2022-09-12 MED ORDER — ALBUTEROL SULFATE (2.5 MG/3ML) 0.083% IN NEBU
2.5000 mg | INHALATION_SOLUTION | Freq: Four times a day (QID) | RESPIRATORY_TRACT | Status: DC | PRN
  Administered 2022-09-18 – 2022-09-23 (×2): 2.5 mg via RESPIRATORY_TRACT
  Filled 2022-09-12 (×2): qty 3

## 2022-09-12 MED ORDER — POTASSIUM CHLORIDE CRYS ER 20 MEQ PO TBCR
40.0000 meq | EXTENDED_RELEASE_TABLET | Freq: Two times a day (BID) | ORAL | Status: DC
  Administered 2022-09-13 – 2022-09-16 (×8): 40 meq via ORAL
  Filled 2022-09-12 (×10): qty 2

## 2022-09-12 MED ORDER — CLOPIDOGREL BISULFATE 75 MG PO TABS
75.0000 mg | ORAL_TABLET | Freq: Every day | ORAL | Status: DC
  Administered 2022-09-13 – 2022-09-14 (×2): 75 mg via ORAL
  Filled 2022-09-12 (×2): qty 1

## 2022-09-12 MED ORDER — HEPARIN (PORCINE) 25000 UT/250ML-% IV SOLN
1600.0000 [IU]/h | INTRAVENOUS | Status: DC
  Administered 2022-09-12: 1600 [IU]/h via INTRAVENOUS
  Filled 2022-09-12: qty 250

## 2022-09-12 MED ORDER — SODIUM CHLORIDE 0.9% FLUSH
3.0000 mL | INTRAVENOUS | Status: DC | PRN
  Administered 2022-10-01 – 2022-10-04 (×2): 3 mL via INTRAVENOUS

## 2022-09-12 MED ORDER — ONDANSETRON HCL 4 MG/2ML IJ SOLN: 4.0000 mg | Freq: Four times a day (QID) | INTRAMUSCULAR | Status: DC | PRN

## 2022-09-12 MED ORDER — FUROSEMIDE 10 MG/ML IJ SOLN
120.0000 mg | Freq: Two times a day (BID) | INTRAVENOUS | Status: DC
  Administered 2022-09-12 – 2022-09-13 (×2): 120 mg via INTRAVENOUS
  Filled 2022-09-12: qty 12
  Filled 2022-09-12: qty 10
  Filled 2022-09-12: qty 2

## 2022-09-12 NOTE — H&P (Signed)
Cardiology Admission History and Physical   Patient ID: Ryan Garner MRN: [id number]; DOB: 1945-07-29   Admission date: 09/12/2022  PCP:  [name], [name], MD   [place] [facility] Cardiologist:  Dorris Carnes, MD   {    Chief Complaint:  Dyspnea  Patient Profile:   Ryan Garner is a 77 y.o. male with chronic HFpEF, severe MR, CKD, CAD with stent to LCX,  who is being seen 09/12/2022 for the evaluation of dyspnea.  History of Present Illness:   Ryan Garner 77 yo male history of HTN, HL, morbid obesity, afib rate controlled, CAD with DES to LCX, severe mitral regurgitation, HFpEF, CKD  admitted with DOE. Reports progressive LE edema and DOE despite strict compliance with home diuretic.   Seen in clinic by Dr Harrington Challenger 09/11/22. NOted marked dyspnea and volume overload, recommendation for hospital admission and IV diuresis along with RHC/LHC and TEE once euvolemic for further evaluation of his severe MR.    Pro bnp 8444 WBC 7.6 Hgb 12.4 Plt 282 Cr 1.3 BUN 49 K 5.4  CXR pending EKG pending   03/2022 echo: LVEF 50-55%, severe MR, RV pressure and volume overload, mod RV dysfunction, severe pulm HTN, severe BAE, mild to mod AS mean grad 17, AVA VTI 1.2 Past Medical History:  Diagnosis Date   A-fib ([place])    Acute on chronic diastolic CHF (congestive heart failure) ([place]) 07/21/2020   Coronary artery disease    Diabetes mellitus without complication (HCC)    Dyspnea    Dysrhythmia    A-fib   Hyperlipidemia    Hypertension    Metabolic syndrome    Prediabetes    Sleep apnea    STEMI (ST elevation myocardial infarction) [facility])     Past Surgical History:  Procedure Laterality Date   APPENDECTOMY     CHOLECYSTECTOMY N/A 03/02/2020   Procedure: LAPAROSCOPIC CHOLECYSTECTOMY;  Surgeon: Aviva Signs, MD;  Location: [facility];  Service: General;  Laterality: N/A;   CORONARY STENT INTERVENTION N/A 04/30/2020   Procedure: CORONARY STENT INTERVENTION;  Surgeon: Martinique, Peter M,  MD;  Location: [place] CV [facility];  Service: Cardiovascular;  Laterality: N/A;   CORONARY/GRAFT ACUTE MI REVASCULARIZATION N/A 04/30/2020   Procedure: Coronary/Graft Acute MI Revascularization;  Surgeon: Martinique, Peter M, MD;  Location: [place] CV LAB;  Service: Cardiovascular;  Laterality: N/A;   Eyelid Surgery Bilateral    HERNIA REPAIR     I & D EXTREMITY Left 04/06/2022   Procedure: LEFT LEG DEBRIDEMENT;  Surgeon: Newt Minion, MD;  Location: [place];  Service: Orthopedics;  Laterality: Left;   I & D EXTREMITY Left 04/04/2022   Procedure: LEFT LEG DEBRIDEMENT;  Surgeon: Newt Minion, MD;  Location: [place];  Service: Orthopedics;  Laterality: Left;   knee tendons repair     LEFT HEART CATH AND CORONARY ANGIOGRAPHY N/A 04/30/2020   Procedure: LEFT HEART CATH AND CORONARY ANGIOGRAPHY;  Surgeon: Martinique, Peter M, MD;  Location: [place] CV LAB;  Service: Cardiovascular;  Laterality: N/A;   ROTATOR CUFF REPAIR Bilateral    TONSILLECTOMY       Medications Prior to Admission: Prior to Admission medications   Medication Sig Start Date End Date Taking? Authorizing Provider  albuterol (VENTOLIN HFA) 108 (90 Base) MCG/ACT inhaler Inhale 2 puffs into the lungs every 6 (six) hours as needed for wheezing or shortness of breath. 07/30/22   [name], [name], MD  apixaban (ELIQUIS) 5 MG TABS tablet Take 1  tablet (5 mg total) by mouth 2 (two) times daily. 09/07/21   Fay Records, MD  ascorbic acid (VITAMIN C) 1000 MG tablet Take 1 tablet (1,000 mg total) by mouth daily. 04/14/22   Suzan Slick, NP  atorvastatin (LIPITOR) 80 MG tablet Take 1 tablet by mouth once daily 09/18/21   Fay Records, MD  clopidogrel (PLAVIX) 75 MG tablet Take 1 tablet (75 mg total) by mouth daily with breakfast. 05/03/20   Reino Bellis B, NP  cyanocobalamin (VITAMIN B12) 1000 MCG tablet Take 1,000 mcg by mouth daily.    [provider]  fluconazole (DIFLUCAN) 150 MG tablet Take 1 tablet (150 mg total) by mouth  once a week. 08/23/22   [name], [name], MD  furosemide (LASIX) 80 MG tablet Take 1.5 tablets (120 mg total) by mouth 2 (two) times daily. 07/31/22   Fay Records, MD  metoprolol tartrate (LOPRESSOR) 25 MG tablet Take 1 tablet (25 mg total) by mouth 2 (two) times daily. 04/14/22 07/30/22  Elodia Florence., MD  Multiple Vitamin (MULTIVITAMIN WITH MINERALS) TABS tablet Take 1 tablet by mouth daily.    [provider]  nitroGLYCERIN (NITROSTAT) 0.4 MG SL tablet Place 1 tablet (0.4 mg total) under the tongue every 5 (five) minutes x 3 doses as needed for chest pain. 04/26/22   Bhagat, Crista Luria, PA  nutrition supplement, JUVEN, (JUVEN) PACK Take 1 packet by mouth 2 (two) times daily between meals. 04/13/22   Suzan Slick, NP  nystatin (MYCOSTATIN/NYSTOP) powder Apply 1 Application topically 3 (three) times daily. 07/13/22   Gwenlyn Perking, FNP  potassium chloride SA (KLOR-CON M) 20 MEQ tablet Take 2 tablets (40 mEq total) by mouth daily. 04/14/22   Elodia Florence., MD  sulfamethoxazole-trimethoprim (BACTRIM DS) 800-160 MG tablet Take 1 tablet by mouth 2 (two) times daily. 08/23/22   [name], [name], MD  Tiotropium Bromide Monohydrate (SPIRIVA RESPIMAT) 1.25 MCG/ACT AERS Inhale 2 puffs into the lungs daily. 03/05/22   Roxan Hockey, MD  zinc sulfate 220 (50 Zn) MG capsule Take 1 capsule (220 mg total) by mouth daily. 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  Tobacco Use    Smoking status: Former    Types: Cigarettes    Quit date: 04/02/1959    Years since quitting: 63.4   Smokeless tobacco: Never  Vaping Use   Vaping Use: Never used  Substance and Sexual Activity   Alcohol use: No   Drug use: No   Sexual activity: Yes  Other Topics Concern   Not on file  Social History Narrative   Not on file   Social Determinants of Health   Financial Resource Strain: Low Risk  (07/04/2022)   Overall Financial Resource Strain (CARDIA)    Difficulty of Paying Living Expenses: Not hard at all  Food Insecurity: No [place] (07/25/2022)  Hunger Vital Sign    Worried About Running Out of Food in the Last Year: Never true    Ran Out of Food in the Last Year: Never true  Transportation Needs: No Transportation Needs (07/25/2022)   PRAPARE - Hydrologist (Medical): No    Lack of Transportation (Non-Medical): No  Physical Activity: Insufficiently Active (07/04/2022)   Exercise Vital Sign    Days of Exercise per Week: 3 days    Minutes of Exercise per Session: 30 min  Stress: No Stress Concern Present (07/04/2022)   [place]    Feeling of Stress : Not at all  Social Connections: [place] (07/04/2022)   Social Connection and Isolation Panel [NHANES]    Frequency of Communication with Friends and Family: More than three times a week    Frequency of Social Gatherings with Friends and Family: More than three times a week    Attends Religious Services: More than 4 times per year    Active Member of [employer] or Organizations: Yes    Attends Music therapist: More than 4 times per year    Marital Status: Married  Human resources officer Violence: Not At Risk (07/04/2022)   Humiliation, Afraid, Rape, and Kick questionnaire    Fear of Current or Ex-Partner: No    Emotionally Abused: No    Physically Abused: No    Sexually Abused: No    Family History:   The  patient's family history includes Arthritis in his father; Cancer in his mother; Coronary artery disease in his mother; Diabetes in his brother and maternal grandmother; Heart attack in his brother.    ROS:  Please see the history of present illness.  All other ROS reviewed and negative.     Physical Exam/Data:   Vitals:   09/12/22 1850  BP: 106/82  Pulse: 97  Temp: 98 F (36.7 C)  TempSrc: Oral  Weight: (!) 139.2 kg  Height: 5' 8"$  (1.727 m)   No intake or output data in the 24 hours ending 09/12/22 1913    09/12/2022    6:50 PM 09/11/2022    2:45 PM 08/23/2022    2:28 PM  Last 3 Weights  Weight (lbs) 306 lb 12.8 oz 310 lb 6.4 oz 319 lb  Weight (kg) 139.164 kg 140.797 kg 144.697 kg     Body mass index is 46.65 kg/m.  General:  Well nourished, well developed, in no acute distress HEENT: normal Neck: +JVD Vascular: No carotid bruits; Distal pulses 2+ bilaterally   Cardiac:  irreg, 3/6 systolic murmur apex Lungs:  clear to auscultation bilaterally, no wheezing, rhonchi or rales  Abd: soft, nontender, no hepatomegaly  Ext: 2-3+ bilateral LE edema Musculoskeletal:  No deformities, BUE and BLE strength normal and equal Skin: warm and dry  Neuro:  CNs 2-12 intact, no focal abnormalities noted Psych:  Normal affect      Laboratory Data:  High Sensitivity Troponin:  No results for input(s): "TROPONINIHS" in the last 720 hours.    Chemistry Recent Labs  Lab 09/11/22 1530  NA 130*  K 5.4*  CL 95*  CO2 18*  GLUCOSE 112*  BUN 49*  CREATININE 1.30*  CALCIUM 9.5    Recent Labs  Lab 09/11/22 1530  PROT 6.7  ALBUMIN 3.6*  AST 20  ALT 12  ALKPHOS 84  BILITOT 0.6   Lipids No results for input(s): "CHOL", "TRIG", "HDL", "LABVLDL", "LDLCALC", "CHOLHDL" in the  last 168 hours. Hematology Recent Labs  Lab 09/11/22 1530  WBC 7.6  RBC 4.27  HGB 12.4*  HCT 36.6*  MCV 86  MCH 29.0  MCHC 33.9  RDW 13.8  PLT 282   Thyroid No results for input(s): "TSH", "FREET4"  in the last 168 hours. BNP Recent Labs  Lab 09/11/22 1530  PROBNP 8,444*    DDimer No results for input(s): "DDIMER" in the last 168 hours.   Radiology/Studies:  No results found.   Assessment and Plan:   1.Acute on chronic HFpEF - echo evidence of diastolic dysfunction, severe MR, severe pulm HTN, D shaped septum with moderate RV dysfunction. - admitted from clinic for IV diuresis per Dr Harrington Challenger - on home lasix 151m bid. Start diuresis with IV lasix 120 mg bid and follow output  2. Severe mitral regurgitation - severe MR by TTE with LVEF 50-55%, LVIDs 4 cm, severe pulm HTN PASP 85 and symptomatic HF.  - followed by Dr RHarrington Challenger her plans are for RHC/LHC and TEE this admission once patient more euvolemic   3. 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 Does have history of OSA listed - RHC once more euvolemic   4. CKD - follow renal function with diuresis.   5. Afib - rate controlled - hold eliquis for anticipated cath later this admission  6. CAD - history of STEMI with stent to LCX in 2021 - remains on plavix with eliquis, defer plavix duration to his primary cardiologist and continue as inpatient     For questions or updates, please contact [place]Please consult [email or web address] for contact info under     Signed, BCarlyle Dolly MD  09/12/2022 7:13 PM

## 2022-09-12 NOTE — Telephone Encounter (Signed)
Per phone call from Dr Harrington Challenger, office clinic note on 09/11/2022  should be sufficient for patient admission.No further action needed at this time.

## 2022-09-12 NOTE — Progress Notes (Signed)
ANTICOAGULATION CONSULT NOTE - Initial Consult  Pharmacy Consult for Heparin Indication: atrial fibrillation  Allergies  Allergen Reactions   Flecainide     Per patient dizziness, shortness of breath, blurred vision   Rofecoxib Rash and Other (See Comments)    (Vioxx)    Patient Measurements: Height: 5' 8"$  (172.7 cm) Weight: (!) 139.2 kg (306 lb 12.8 oz) IBW/kg (Calculated) : 68.4 Heparin Dosing Weight: 100 kg  Vital Signs: Temp: 98.1 F (36.7 C) (02/21 1931) Temp Source: Oral (02/21 1931) BP: 106/82 (02/21 1850) Pulse Rate: 97 (02/21 1850)  Labs: Recent Labs    09/11/22 1530  HGB 12.4*  HCT 36.6*  PLT 282  CREATININE 1.30*    Estimated Creatinine Clearance: 64.1 mL/min (A) (by C-G formula based on SCr of 1.3 mg/dL (H)).   Medical History: Past Medical History:  Diagnosis Date   A-fib [facility])    Acute on chronic diastolic CHF (congestive heart failure) ([place]) 07/21/2020   Coronary artery disease    Diabetes mellitus without complication (HCC)    Dyspnea    Dysrhythmia    A-fib   Hyperlipidemia    Hypertension    Metabolic syndrome    Prediabetes    Sleep apnea    STEMI (ST elevation myocardial infarction) ([place])     Medications:  Medications Prior to Admission  Medication Sig Dispense Refill Last Dose   albuterol (VENTOLIN HFA) 108 (90 Base) MCG/ACT inhaler Inhale 2 puffs into the lungs every 6 (six) hours as needed for wheezing or shortness of breath. 8 g 0    apixaban (ELIQUIS) 5 MG TABS tablet Take 1 tablet (5 mg total) by mouth 2 (two) times daily. 180 tablet 1    ascorbic acid (VITAMIN C) 1000 MG tablet Take 1 tablet (1,000 mg total) by mouth daily. 60 tablet 1    atorvastatin (LIPITOR) 80 MG tablet Take 1 tablet by mouth once daily 90 tablet 3    clopidogrel (PLAVIX) 75 MG tablet Take 1 tablet (75 mg total) by mouth daily with breakfast. 90 tablet 2    cyanocobalamin (VITAMIN B12) 1000 MCG tablet Take 1,000 mcg by mouth daily.      fluconazole  (DIFLUCAN) 150 MG tablet Take 1 tablet (150 mg total) by mouth once a week. 2 tablet 0    furosemide (LASIX) 80 MG tablet Take 1.5 tablets (120 mg total) by mouth 2 (two) times daily.      metoprolol tartrate (LOPRESSOR) 25 MG tablet Take 1 tablet (25 mg total) by mouth 2 (two) times daily. 60 tablet 0    Multiple Vitamin (MULTIVITAMIN WITH MINERALS) TABS tablet Take 1 tablet by mouth daily.      nitroGLYCERIN (NITROSTAT) 0.4 MG SL tablet Place 1 tablet (0.4 mg total) under the tongue every 5 (five) minutes x 3 doses as needed for chest pain. 25 tablet 2    nutrition supplement, JUVEN, (JUVEN) PACK Take 1 packet by mouth 2 (two) times daily between meals. 60 packet 1    nystatin (MYCOSTATIN/NYSTOP) powder Apply 1 Application topically 3 (three) times daily. 15 g 0    potassium chloride SA (KLOR-CON M) 20 MEQ tablet Take 2 tablets (40 mEq total) by mouth daily.      sulfamethoxazole-trimethoprim (BACTRIM DS) 800-160 MG tablet Take 1 tablet by mouth 2 (two) times daily. 20 tablet 0    Tiotropium Bromide Monohydrate (SPIRIVA RESPIMAT) 1.25 MCG/ACT AERS Inhale 2 puffs into the lungs daily. 4 g 3    zinc sulfate 220 (50  Zn) MG capsule Take 1 capsule (220 mg total) by mouth daily. 60 capsule 0     Assessment: 77 y.o. M presents for TEE and probable R/L heart cath on 2/23. Pt on apixaban PTA for afib - last dose 2/21 0900. To hold apixaban and start heparin bridge. Apixaban will be affecting heparin level so will utilize aPTT for monitoring until levels correlate.  Goal of Therapy:  Heparin level 0.3-0.7 units/ml aPTT 66-102 seconds Monitor platelets by anticoagulation protocol: Yes   Plan:  At 2100, start heparin gtt at 1600 units/hr. No bolus. Will f/u heparin level and aPTT in 8 hours Daily heparin level, aPTT, and CBC  Sherlon Handing, PharmD, BCPS Please see amion for complete clinical pharmacist phone list 09/12/2022,7:32 PM

## 2022-09-12 NOTE — Telephone Encounter (Signed)
Spoke to the patient, received a phone call stating the hospital needs documentation from the MD prior to admission. The patient stated he doesn't know what type of documentation. Will forward to MD and nurse.

## 2022-09-12 NOTE — Telephone Encounter (Signed)
Called the pt to check in with him... he is doing as well as he can be and I advised him that he should be hearing from the hospital re: his admission but it may take some time. To let us know if anything worsens or if he has any questions and he agrees.

## 2022-09-12 NOTE — Telephone Encounter (Signed)
Patient called stating Dr. Harrington Challenger has to send something to the hospital for him to be admitted.

## 2022-09-13 ENCOUNTER — Inpatient Hospital Stay: Payer: Self-pay

## 2022-09-13 ENCOUNTER — Inpatient Hospital Stay (HOSPITAL_COMMUNITY): Payer: Medicare Other

## 2022-09-13 ENCOUNTER — Telehealth: Payer: Self-pay

## 2022-09-13 DIAGNOSIS — I359 Nonrheumatic aortic valve disorder, unspecified: Secondary | ICD-10-CM | POA: Diagnosis not present

## 2022-09-13 DIAGNOSIS — R0602 Shortness of breath: Secondary | ICD-10-CM | POA: Diagnosis not present

## 2022-09-13 DIAGNOSIS — I4821 Permanent atrial fibrillation: Secondary | ICD-10-CM

## 2022-09-13 DIAGNOSIS — J90 Pleural effusion, not elsewhere classified: Secondary | ICD-10-CM | POA: Diagnosis not present

## 2022-09-13 DIAGNOSIS — N183 Chronic kidney disease, stage 3 unspecified: Secondary | ICD-10-CM

## 2022-09-13 DIAGNOSIS — R059 Cough, unspecified: Secondary | ICD-10-CM | POA: Diagnosis not present

## 2022-09-13 DIAGNOSIS — J811 Chronic pulmonary edema: Secondary | ICD-10-CM | POA: Diagnosis not present

## 2022-09-13 DIAGNOSIS — I5033 Acute on chronic diastolic (congestive) heart failure: Secondary | ICD-10-CM | POA: Diagnosis not present

## 2022-09-13 LAB — BASIC METABOLIC PANEL
Anion gap: 8 (ref 5–15)
BUN: 54 mg/dL — ABNORMAL HIGH (ref 8–23)
CO2: 23 mmol/L (ref 22–32)
Calcium: 9.5 mg/dL (ref 8.9–10.3)
Chloride: 99 mmol/L (ref 98–111)
Creatinine, Ser: 1.62 mg/dL — ABNORMAL HIGH (ref 0.61–1.24)
GFR, Estimated: 43 mL/min — ABNORMAL LOW (ref >60)
Glucose, Bld: 109 mg/dL — ABNORMAL HIGH (ref 70–99)
Potassium: 4.8 mmol/L (ref 3.5–5.1)
Sodium: 130 mmol/L — ABNORMAL LOW (ref 135–145)

## 2022-09-13 LAB — APTT
aPTT: >200 seconds (ref 24–36)
aPTT: 139 seconds — ABNORMAL HIGH (ref 24–36)

## 2022-09-13 LAB — COOXEMETRY PANEL
Carboxyhemoglobin: 2 % — ABNORMAL HIGH (ref 0.5–1.5)
Methemoglobin: <0.7 % (ref 0.0–1.5)
O2 Saturation: 68.5 %
Total hemoglobin: 11.9 g/dL — ABNORMAL LOW (ref 12.0–16.0)

## 2022-09-13 LAB — HEPARIN LEVEL (UNFRACTIONATED)
Heparin Unfractionated: >1.1 IU/mL — ABNORMAL HIGH (ref 0.30–0.70)
Heparin Unfractionated: >1.1 IU/mL — ABNORMAL HIGH (ref 0.30–0.70)

## 2022-09-13 MED ORDER — HEPARIN (PORCINE) 25000 UT/250ML-% IV SOLN: 1200.0000 [IU]/h | INTRAVENOUS | Status: DC

## 2022-09-13 MED ORDER — FUROSEMIDE 10 MG/ML IJ SOLN
20.0000 mg/h | INTRAVENOUS | Status: DC
  Administered 2022-09-13 (×2): 15 mg/h via INTRAVENOUS
  Administered 2022-09-14: 20 mg/h via INTRAVENOUS
  Administered 2022-09-14: 15 mg/h via INTRAVENOUS
  Administered 2022-09-15 – 2022-09-16 (×4): 20 mg/h via INTRAVENOUS
  Filled 2022-09-13 (×11): qty 20

## 2022-09-13 MED ORDER — DOBUTAMINE IN D5W 4-5 MG/ML-% IV SOLN
2.5000 ug/kg/min | INTRAVENOUS | Status: DC
  Administered 2022-09-13 – 2022-09-27 (×8): 2.5 ug/kg/min via INTRAVENOUS
  Filled 2022-09-13 (×13): qty 250

## 2022-09-13 MED ORDER — HEPARIN (PORCINE) 25000 UT/250ML-% IV SOLN
1400.0000 [IU]/h | INTRAVENOUS | Status: DC
  Administered 2022-09-13 (×2): 1400 [IU]/h via INTRAVENOUS
  Filled 2022-09-13: qty 250

## 2022-09-13 MED ORDER — CHLORHEXIDINE GLUCONATE CLOTH 2 % EX PADS
6.0000 | MEDICATED_PAD | Freq: Every day | CUTANEOUS | Status: DC
  Administered 2022-09-13 – 2022-10-11 (×29): 6 via TOPICAL

## 2022-09-13 NOTE — Progress Notes (Signed)
ANTICOAGULATION CONSULT NOTE - Initial Consult  Pharmacy Consult for Heparin Indication: atrial fibrillation  Allergies  Allergen Reactions   Flecainide     Per patient dizziness, shortness of breath, blurred vision   Rofecoxib Rash and Other (See Comments)    (Vioxx)    Patient Measurements: Height: 5' 8"$  (172.7 cm) Weight: (!) 139.2 kg (306 lb 14.1 oz) IBW/kg (Calculated) : 68.4 Heparin Dosing Weight: 100 kg  Vital Signs: Temp: 97.7 F (36.5 C) (02/22 0817) Temp Source: Oral (02/22 0817) BP: 95/70 (02/22 0817) Pulse Rate: 91 (02/22 0817)  Labs: Recent Labs    09/11/22 1530 09/12/22 2101 09/13/22 0721  HGB 12.4* 11.8*  --   HCT 36.6* 36.6*  --   PLT 282 258  --   APTT  --   --  >200*  HEPARINUNFRC  --   --  >1.10*  CREATININE 1.30* 1.33* 1.62*     Estimated Creatinine Clearance: 51.4 mL/min (A) (by C-G formula based on SCr of 1.62 mg/dL (H)).   Medical History: Past Medical History:  Diagnosis Date   A-fib [facility])    Acute on chronic diastolic CHF (congestive heart failure) ([place]) 07/21/2020   Coronary artery disease    Diabetes mellitus without complication (HCC)    Dyspnea    Dysrhythmia    A-fib   Hyperlipidemia    Hypertension    Metabolic syndrome    Prediabetes    Sleep apnea    STEMI (ST elevation myocardial infarction) ([place])     Medications:  Medications Prior to Admission  Medication Sig Dispense Refill Last Dose   albuterol (VENTOLIN HFA) 108 (90 Base) MCG/ACT inhaler Inhale 2 puffs into the lungs every 6 (six) hours as needed for wheezing or shortness of breath. 8 g 0 Past Month   apixaban (ELIQUIS) 5 MG TABS tablet Take 1 tablet (5 mg total) by mouth 2 (two) times daily. 180 tablet 1 09/12/2022 at 0900   ascorbic acid (VITAMIN C) 1000 MG tablet Take 1 tablet (1,000 mg total) by mouth daily. 60 tablet 1 09/12/2022   atorvastatin (LIPITOR) 80 MG tablet Take 1 tablet by mouth once daily 90 tablet 3 09/12/2022   clopidogrel (PLAVIX) 75 MG tablet  Take 1 tablet (75 mg total) by mouth daily with breakfast. 90 tablet 2 09/12/2022   cyanocobalamin (VITAMIN B12) 1000 MCG tablet Take 1,000 mcg by mouth daily.   09/12/2022   furosemide (LASIX) 80 MG tablet Take 1.5 tablets (120 mg total) by mouth 2 (two) times daily.   09/12/2022   metoprolol tartrate (LOPRESSOR) 25 MG tablet Take 1 tablet (25 mg total) by mouth 2 (two) times daily. 60 tablet 0 09/12/2022 at 0800   Multiple Vitamin (MULTIVITAMIN WITH MINERALS) TABS tablet Take 1 tablet by mouth daily.   09/12/2022   nitroGLYCERIN (NITROSTAT) 0.4 MG SL tablet Place 1 tablet (0.4 mg total) under the tongue every 5 (five) minutes x 3 doses as needed for chest pain. 25 tablet 2 Past Month   nutrition supplement, JUVEN, (JUVEN) PACK Take 1 packet by mouth 2 (two) times daily between meals. 60 packet 1 09/12/2022   nystatin (MYCOSTATIN/NYSTOP) powder Apply 1 Application topically 3 (three) times daily. 15 g 0 09/12/2022   potassium chloride SA (KLOR-CON M) 20 MEQ tablet Take 2 tablets (40 mEq total) by mouth daily.   09/12/2022   Tiotropium Bromide Monohydrate (SPIRIVA RESPIMAT) 1.25 MCG/ACT AERS Inhale 2 puffs into the lungs daily. 4 g 3 09/12/2022   zinc sulfate 220 (50  Zn) MG capsule Take 1 capsule (220 mg total) by mouth daily. 60 capsule 0 09/12/2022   fluconazole (DIFLUCAN) 150 MG tablet Take 1 tablet (150 mg total) by mouth once a week. (Patient not taking: Reported on 09/12/2022) 2 tablet 0 Completed Course   sulfamethoxazole-trimethoprim (BACTRIM DS) 800-160 MG tablet Take 1 tablet by mouth 2 (two) times daily. (Patient not taking: Reported on 09/12/2022) 20 tablet 0 Completed Course    Assessment: 77 y.o. M presents for TEE and probable R/L heart cath on 2/23. Pt on apixaban PTA for afib - last dose 2/21 0900. To hold apixaban and start heparin bridge. Apixaban will be affecting heparin level so will utilize aPTT for monitoring until levels correlate.  Morning level resulted with aPTT > 200 and HL > 1.10.  Spoke with nursing confirmed that level was drawn from separate arm from heparin. No issues with bleeding.   Goal of Therapy:  Heparin level 0.3-0.7 units/ml aPTT 66-102 seconds Monitor platelets by anticoagulation protocol: Yes   Plan:  Hold heparin infusion x 1 hour.  Decrease heparin to 1400u/hr.  Will f/u heparin level and aPTT in 8 hours. Daily heparin level, aPTT, and CBC.  F/u plans for TEE and RHC/LHC.   Esmeralda Arthur, PharmD, BCCCP  Please see amion for complete clinical pharmacist phone list 09/13/2022,8:44 AM

## 2022-09-13 NOTE — Progress Notes (Addendum)
RN attempted to apply the DNR on bracelet in room with NT at bedside performing patient care. Patient notified RN that "I want compressions". RN notified CHF team and notified cardiology team. DNR band is not placed on patient.

## 2022-09-13 NOTE — Progress Notes (Signed)
Peripherally Inserted Central Catheter Placement  The IV Nurse has discussed with the patient and/or persons authorized to consent for the patient, the purpose of this procedure and the potential benefits and risks involved with this procedure.  The benefits include less needle sticks, lab draws from the catheter, and the patient may be discharged home with the catheter. Risks include, but not limited to, infection, bleeding, blood clot (thrombus formation), and puncture of an artery; nerve damage and irregular heartbeat and possibility to perform a PICC exchange if needed/ordered by physician.  Alternatives to this procedure were also discussed.  Bard Power PICC patient education guide, fact sheet on infection prevention and patient information card has been provided to patient /or left at bedside.    PICC Placement Documentation  PICC Double Lumen [id number] Right Basilic 44 cm 0 cm (Active)  Indication for Insertion or Continuance of Line Vasoactive infusions 09/13/22 1707  Exposed Catheter (cm) 0 cm 09/13/22 1707  Site Assessment Clean, Dry, Intact 09/13/22 1707  Lumen #1 Status Flushed;Saline locked;Blood return noted 09/13/22 1707  Lumen #2 Status Flushed;Saline locked;Blood return noted 09/13/22 1707  Dressing Type Transparent;Securing device 09/13/22 1707  Dressing Status Antimicrobial disc in place;Clean, Dry, Intact 09/13/22 1707  Safety Lock Not Applicable [id number]  Line Care Connections checked and tightened 09/13/22 1707  Line Adjustment (NICU/IV Team Only) No 09/13/22 1707  Dressing Intervention New dressing;Other (Comment) 09/13/22 1707  Dressing Change Due 09/20/22 09/13/22 1707       Ryan Garner 09/13/2022, 5:09 PM

## 2022-09-13 NOTE — Progress Notes (Signed)
Heart Failure Navigator Progress Note  Assessed for Heart & Vascular TOC clinic readiness.  Patient does not meet criteria due to AHF rounding team consulted this hospitalization.   HF Navigation team to sign-off.   Pricilla Holm, MSN, RN Heart Failure Nurse Navigator

## 2022-09-13 NOTE — Progress Notes (Addendum)
Rounding Note    Patient Name: Ryan Garner Date of Encounter: 09/13/2022  [place] Cardiologist: Dorris Carnes, MD   Subjective   No acute overnight events. Patient is very tired this morning and states he did not sleep at all last night. He thinks this is due to taking Metoprolol. He has not been taking this at home because it makes him itch and have trouble sleeping but he got it here last night. He is not sure if his shortness of breath contributed to his inability to sleep last night. He continues to have shortness of breath and is markedly volume overloaded on exam. No chest pain.   Inpatient Medications    Scheduled Meds:  atorvastatin  80 mg Oral Daily   clopidogrel  75 mg Oral Q breakfast   metoprolol tartrate  25 mg Oral BID   potassium chloride  40 mEq Oral BID   sodium chloride flush  3 mL Intravenous Q12H   umeclidinium bromide  1 puff Inhalation Daily   Continuous Infusions:  sodium chloride     furosemide 120 mg (09/13/22 0823)   heparin     PRN Meds: sodium chloride, acetaminophen, albuterol, ondansetron (ZOFRAN) IV, sodium chloride flush   Vital Signs    Vitals:   09/13/22 0400 09/13/22 0500 09/13/22 0716 09/13/22 0817  BP: (!) 82/63  93/75 95/70  Pulse: 88  74 91  Resp: 18  16 20  $ Temp: 98.1 F (36.7 C)   97.7 F (36.5 C)  TempSrc: Oral   Oral  SpO2: 97%   96%  Weight:  (!) 139.2 kg    Height:        Intake/Output Summary (Last 24 hours) at 09/13/2022 0939 Last data filed at 09/13/2022 [id number] Gross per 24 hour  Intake 437.97 ml  Output 950 ml  Net -512.03 ml      09/13/2022    5:00 AM 09/13/2022   12:25 AM 09/12/2022    6:50 PM  Last 3 Weights  Weight (lbs) 306 lb 14.1 oz 306 lb 14.1 oz 306 lb 12.8 oz  Weight (kg) 139.2 kg 139.2 kg 139.164 kg      Telemetry    Atrial fibrillation with rates in the 70s to 80s. - Personally Reviewed  ECG    No new ECG tracing today. - Personally Reviewed  Physical Exam   GEN: Morbidly  obese Caucasian male in no acute distress.   Neck: JVD elevated. Cardiac: Irregularly irregular rhythm with normal rate. Difficult to appreciate heart sounds over course breath sounds but soft systolic murmur noted at apex.   Respiratory: Mild increased work of breath. Course breath sounds noted throughout with rhonchi.  GI: Soft,distended, and non-tender. MS: 3+ pitting edema extending up to thighs and abdomen. No deformity. Skin: Warm and dry. Neuro:  No focal deficits. Psych: Normal affect.   Labs    High Sensitivity Troponin:  No results for input(s): "TROPONINIHS" in the last 720 hours.   Chemistry Recent Labs  Lab 09/11/22 1530 09/12/22 2101 09/13/22 0721  NA 130* 131* 130*  K 5.4* 4.5 4.8  CL 95* 100 99  CO2 18* 22 23  GLUCOSE 112* 131* 109*  BUN 49* 54* 54*  CREATININE 1.30* 1.33* 1.62*  CALCIUM 9.5 9.4 9.5  MG  --  2.0  --   PROT 6.7 6.8  --   ALBUMIN 3.6* 3.1*  --   AST 20 19  --   ALT 12 13  --  ALKPHOS 84 69  --   BILITOT 0.6 0.9  --   GFRNONAA  --  55* 43*  ANIONGAP  --  9 8    Lipids No results for input(s): "CHOL", "TRIG", "HDL", "LABVLDL", "LDLCALC", "CHOLHDL" in the last 168 hours.  Hematology Recent Labs  Lab 09/11/22 1530 09/12/22 2101  WBC 7.6 6.3  RBC 4.27 4.04*  HGB 12.4* 11.8*  HCT 36.6* 36.6*  MCV 86 90.6  MCH 29.0 29.2  MCHC 33.9 32.2  RDW 13.8 16.1*  PLT 282 258   Thyroid No results for input(s): "TSH", "FREET4" in the last 168 hours.  BNP Recent Labs  Lab 09/11/22 1530 09/12/22 2101  BNP  --  577.3*  PROBNP 8,444*  --     DDimer No results for input(s): "DDIMER" in the last 168 hours.   Radiology    DG Chest Port 1 View  Result Date: 09/13/2022 CLINICAL DATA:  Shortness of breath, cough, and congestion EXAM: PORTABLE CHEST 1 VIEW COMPARISON:  Chest radiograph dated 07/30/2022 FINDINGS: Low lung volumes. Patchy right basilar opacities. Small bilateral pleural effusions. No pneumothorax. Enlarged cardiomediastinal  silhouette is likely projectional. The visualized skeletal structures are unremarkable. IMPRESSION: 1. Patchy right basilar opacities, which may represent atelectasis, aspiration, or pneumonia. 2. Small bilateral pleural effusions. Electronically Signed   By: Darrin Nipper M.D.   On: 09/13/2022 08:23    Cardiac Studies   Left Cardiac Catheterization 04/30/2020: Ramus lesion is 75% stenosed. Mid Cx lesion is 99% stenosed. Post intervention, there is a 0% residual stenosis. A drug-eluting stent was successfully placed using a STENT RESOLUTE ONYX 2.5X34. The left ventricular systolic function is normal. LV end diastolic pressure is mildly elevated. The left ventricular ejection fraction is 55-65% by visual estimate.   1. 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 Good LV systolic function 3. Elevated LVEDP 4. Successful PCI of the mid to distal LCx with DES x 1.   Plan: ASA 81 mg daily for one month. Plavix 75 mg daily for one year. May resume Eliquis tomorrow. IV diuresis. Will need to stop Flecainide. Consider alternative AAD therapy.  Diagnostic Dominance: Right  Intervention      _______________  Complete Echocardiogram 04/07/2022: Impressions: 1. Left ventricular ejection fraction, by estimation, is 50 to 55%. The  left ventricle has mildly decreased function. The left ventricle has no  regional wall motion abnormalities. There is mild concentric left  ventricular hypertrophy. Left ventricular  diastolic function could not be evaluated. There is the interventricular  septum is flattened in systole and diastole, consistent with right  ventricular pressure and volume overload.   2. Right ventricular systolic function is moderately reduced. The right  ventricular size is moderately enlarged. There is severely elevated  pulmonary artery systolic pressure. The estimated right ventricular  systolic pressure is [id number] mmHg.   3. Left atrial size was severely  dilated.   4. Right atrial size was severely dilated.   5. The mitral valve is grossly normal. Severe mitral valve regurgitation.  Moderate mitral annular calcification.   6. The aortic valve is tricuspid. There is severe calcifcation of the  aortic valve. There is severe thickening of the aortic valve. Aortic valve  regurgitation is not visualized. Moderate aortic valve stenosis. Aortic  valve mean gradient measures 17.0  mmHg. Aortic valve Vmax measures 2.71 m/s. Aortic valve acceleration time  measures 120 msec.   7. Aortic dilatation noted. There is mild dilatation of the ascending  aorta,  measuring 41 mm. There is mild dilatation of the aortic root,  measuring 39 mm.   8. The inferior vena cava is dilated in size with <50% respiratory  variability, suggesting right atrial pressure of 15 mmHg.   Comparison(s): Prior images reviewed side by side. The left ventricular  function is worsened. Mitral insufficiency is worse.  _______________  Limited Echocardiogram 04/12/2022: Impressions:  1. Very limited study to evaluated mitral regurgitation. Severe mitral  valve regurgitation appears to be related to restricted PMVL. Jet is  posteriorly directed. Unchanged from prior. The mitral valve is abnormal.  Severe mitral valve regurgitation. No  evidence of mitral stenosis.   2. Left ventricular ejection fraction, by estimation, is 50 to 55%. The  left ventricle has low normal function. The left ventricle demonstrates  global hypokinesis. There is the interventricular septum is flattened in  systole and diastole, consistent  with right ventricular pressure and volume overload.   3. Right ventricular systolic function was not well visualized. The right  ventricular size is not well visualized.   Comparison(s): No significant change from prior study.     Patient Profile     77 y.o. male with a history of CAD with STEMI in 04/2020 s/p DES to LCX, chronic diastolic CHF, permanent atrial  fibrillation on Eliquis, severe mitral regurgitation, hypertension, hyperlipidemia, pre-diabetes, and sleep apnea who was admitted on 09/12/2022 from the office for acute on chronic CHF after presenting with significant shortness of breath and volume overload.   Assessment & Plan    Acute on Chronic Diastolic CHF Lymphedema   Patient was admitted from the office after presenting with significant shortness of breath and volume overload. BNP elevated at 577. Chest x-ray showed patchy right basilar opacities which may represent atelectasis, aspiration, or pneumonia as well as small bilateral pleural effusions. Last Echo in [facility]/2022 at [facility] showed LVEF of >55% with moderate to severe MR and moderate to severe aortic stenosis. He was started on high dose IV Lasix. Only 950 cc of documented urinary output so far. BP is soft and renal function bumped from 1.33 to 1.62. - Patient is markedly volume overloaded and did not have as much urinary output as I would expect on IV Lasix 125m twice daily. BP is soft and as low as 82/63 overnight. - Will stop Lopressor given soft BP. He also states that he has not been taking this at home because it causes him to itch and not be able to sleep. - I think he is going to need inotropes to help u[place]ately diuresis. He will also need a R/LHC this admission. Will discuss with MD. - Of note, he also has lymphedema and known venous ulcers and follows with Dr. DSharol Givenfor this. Dr. DSharol Givenhas previously has previously lymphedema pump and 3 layer compression wraps at home. Wound care saw patient for his venous ulcers but lymphedema is outside their scope of practice and we do not have lymphedema services as an inpatient. Will continue to diuresis as above. Will not order Unna boots given venous ulcers.  - Continue to monitor daily weights, strict I/Os, and renal function.  Valvular Disease Echo in 03/2022 showed severe MR which appeared to be related to restricted PMVL.  [facility] moderate severe AS, and moderate TR.  - Will need R/LHC and TEE this admission once adequately diuresed.   CAD History of STEMI in 04/2020 s/p DES to LCX. -  No angina.  - Not on aspirin at home due to need for anticoagulation. - Continue statin.   Permanent Atrial Fibrillation  Previously on Flecainide but this was stopped after STEMI. He has seen EP and not felt to be a candidate for Tikosyn or Amiodarone. Therefore, plan has been for rate control. - Rate controlled.  - Will stop Lopressor given soft BP. Patient reports side effects with this as described above. - On chronic anticoagulation with Eliquis at home. However, on IV Heparin here in anticipation for [facility].   Ascending Aorta Dilatation  Last Echo in 06/2022 at [facility] showed a dilated ascending aorta measuring 4.3 cm.  - Can continue to monitor as an outpatient.   Hypotensive  History of hypertension; however, BP has been low this admission. BP as low as 82/63 overnight. Systolic BP currently in the 90s. Asymptomatic with this. - Will stop Lopressor as above. - I think we may need inotropes to help [place] with diuresis. Will discuss with MD.  Hyperlipidemia - Continue home Lipitor 20m daily.  CKD Stage III Creatinine 1.33 on admission. Up to 1.62 today.  - I think he is going to need inotropes to help uKoreadiuresis him as above. - Continue to monitor closely.     For questions or updates, please contact CMorgan's PointPlease consult [email or web address] for contact info under        Signed, CDarreld Mclean PA-C  09/13/2022, 9:39 AM    ADDENDUM:  I was called back into see him about 10 minutes after leaving his room. He was complaining of worsening shortness of breath and lightheadedness. He continues to have diffuse rhonchi and crackles in bases. Systolic BP dropped to the 80s. We placed him back in bed and placed him on 2L of nasal  cannula. HE got morning dose of IV Lasix 1271mbut has not had any urinary output with this. Will bladder scan him.  Will also repeat chest x-ray. Discussed with Dr. SkMarlou Porchnd we will go ahead and consult Advanced CHF team.  CaDarreld McleanPA-C 09/13/2022 10:53 AM  Personally seen and examined. Agree with above.  [age]ear old with severe mitral regurgitation, severe deconditioning, morbid obesity, 4+ lower extremity edema/lymphedema with excessive fatigue, shortness of breath, hypotension, D-shaped septum on echocardiogram consistent with right ventricular pressure volume overload.  Creatinine is up from 1.3-1.6.  Remains hypotension.  Trying to diurese with IV Lasix.  Plan originally was for continued diuresis, tuneup prior to right and left heart catheterization as well as TEE for further evaluation of mitral valve.  Severe mitral regurgitation noted on transthoracic echocardiogram.  He is not producing large quantities of urine currently.  We will go ahead and ask for advanced heart failure team to see/evaluate patient for further potential inotropic care.  May need PICC line for Co. ox.  Concerned that his severe mitral regurgitation in addition to severe deconditioning is driving his current status.  Unsure of candidacy for MitraClip etc.  MaCandee FurbishMD

## 2022-09-13 NOTE — Consult Note (Addendum)
Advanced Heart Failure Team Consult Note   Primary Physician: [name], [name], MD PCP-Cardiologist:  Dorris Carnes, MD  Reason for Consultation: A/C HFpEF   HPI:    Ryan Garner is seen today for evaluation of A/C HF at the request of Dr Marlou Porch.   Ryan Garner is 77 year old with a history of CAD, STEMI 2021, DES to LCx, permanent A fib, severe Ryan, HTN, HLD, sleep apnea, Ryan, chronic lymphedema, and HFpEF.   Admitted to [facility] 07/01/2022 with A/C HFpEF. Diuresed with IV lasix. Transitioned to po lasix 80 mg daily..   Saw Dr Harrington Challenger 09/11/22 Marked volume overload. Set up for admit for IV diuresis followed by cath and TEE.   Prior to admit having difficulty walking due to increased lower extremity edema. No chest pain. SOB with exertion and complaining of fatigue. Lives alone.   Admitted with A/C HFpEF. CXR with small pleural effusions. Pertinent admission labs: Hgb 11.8, K 5.4, Creatinine 1.3, Mag 2, and BNP 577. Diuresing with 120 mg IV lasix twice a day. Started on IV lasix. Creatinine bump today. Sluggish urine output. HF Team consulted.   Echo 2023   1. Very limited study to evaluated mitral regurgitation. Severe mitral  valve regurgitation appears to be related to restricted PMVL. Jet is  posteriorly directed. Unchanged from prior. The mitral valve is abnormal.  Severe mitral valve regurgitation. No  evidence of mitral stenosis.   2. Left ventricular ejection fraction, by estimation, is 50 to 55%. The  left ventricle has low normal function. The left ventricle demonstrates  global hypokinesis. There is the interventricular septum is flattened in  systole and diastole, consistent  with right ventricular pressure and volume overload.   3. Right ventricular systolic function was not well visualized. The right  ventricular size is not well visualized.   Echo 2022 1. Left ventricular ejection fraction, by estimation, is 60 to 65%. The  left ventricle has normal function. The  left ventricle has no regional  wall motion abnormalities. There is moderate left ventricular hypertrophy.  Left ventricular diastolic  parameters are indeterminate.   2. Ventricular septum if flattened in systole suggesting RV pressure  overload. . Right ventricular systolic function is mildly reduced. The  right ventricular size is moderately enlarged. There is severely elevated  pulmonary artery systolic pressure.   3. Left atrial size was moderately dilated.   4. Right atrial size was moderately dilated.   5. The mitral valve is abnormal. Mild to moderate mitral valve  regurgitation. No evidence of mitral stenosis.   6. The tricuspid valve is abnormal. Tricuspid valve regurgitation is  moderate.   7. Mild to moderate aortic stenosis. Lower than expected gradient may be  due to decreased SVI.Marland Kitchen The aortic valve is tricuspid. There is moderate  calcification of the aortic valve. There is moderate thickening of the  aortic valve. Aortic valve  regurgitation is not visualized. Mild to moderate aortic valve stenosis.   Left Cardiac Catheterization 04/30/2020: Ramus lesion is 75% stenosed. Mid Cx lesion is 99% stenosed. Post intervention, there is a 0% residual stenosis. A drug-eluting stent was successfully placed using a STENT RESOLUTE ONYX 2.5X34. The left ventricular systolic function is normal. LV end diastolic pressure is mildly elevated. The left ventricular ejection fraction is 55-65% by visual estimate.   1. 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 Good LV systolic function 3. Elevated LVEDP 4. Successful PCI of the mid to distal LCx with  DES x 1. Review of Systems: [y] = yes, [ ]$  = no   General: Weight gain [ ]$ ; Weight loss [ ]$ ; Anorexia [ ]$ ; Fatigue [ ]$ Y; Fever [ ]$ ; Chills [ ]$ ; Weakness [ ]$   Cardiac: Chest pain/pressure [ ]$ ; Resting SOB [ ]$ ; Exertional SOB []$ ; Orthopnea [ Y]; Pedal Edema [ Y]; Palpitations [ ]$ ; Syncope [ ]$ ; Presyncope [ ]$ ; Paroxysmal  nocturnal dyspnea[ ]$   Pulmonary: Cough [ ]$ ; Wheezing[ ]$ ; Hemoptysis[ ]$ ; Sputum [ ]$ ; Snoring [ ]$   GI: Vomiting[ ]$ ; Dysphagia[ ]$ ; Melena[ ]$ ; Hematochezia [ ]$ ; Heartburn[ ]$ ; Abdominal pain [ ]$ ; Constipation [ ]$ ; Diarrhea [ ]$ ; BRBPR [ ]$   GU: Hematuria[ ]$ ; Dysuria [ ]$ ; Nocturia[ ]$   Vascular: Pain in legs with walking [ ]$ ; Pain in feet with lying flat [ ]$ ; Non-healing sores [ ]$ ; Stroke [ ]$ ; TIA [ ]$ ; Slurred speech [ ]$ ;  Neuro: Headaches[ ]$ ; Vertigo[ ]$ ; Seizures[ ]$ ; Paresthesias[ ]$ ;Blurred vision [ ]$ ; Diplopia [ ]$ ; Vision changes [ ]$   Ortho/Skin: Arthritis [ ]$ ; Joint pain [ Y]; Muscle pain [ ]$ ; Joint swelling [ ]$ ; Back Pain [Y ]; Rash [ ]$   Psych: Depression[ ]$ ; Anxiety[ ]$   Heme: Bleeding problems [ ]$ ; Clotting disorders [ ]$ ; Anemia [ ]$   Endocrine: Diabetes [ ]$ ; Thyroid dysfunction[ ]$   Home Medications Prior to Admission medications   Medication Sig Start Date End Date Taking? Authorizing Provider  albuterol (VENTOLIN HFA) 108 (90 Base) MCG/ACT inhaler Inhale 2 puffs into the lungs every 6 (six) hours as needed for wheezing or shortness of breath. 07/30/22  Yes [name], [name], MD  apixaban (ELIQUIS) 5 MG TABS tablet Take 1 tablet (5 mg total) by mouth 2 (two) times daily. 09/07/21  Yes Fay Records, MD  ascorbic acid (VITAMIN C) 1000 MG tablet Take 1 tablet (1,000 mg total) by mouth daily. 04/14/22  Yes Suzan Slick, NP  atorvastatin (LIPITOR) 80 MG tablet Take 1 tablet by mouth once daily 09/18/21  Yes Fay Records, MD  clopidogrel (PLAVIX) 75 MG tablet Take 1 tablet (75 mg total) by mouth daily with breakfast. 05/03/20  Yes Reino Bellis B, NP  cyanocobalamin (VITAMIN B12) 1000 MCG tablet Take 1,000 mcg by mouth daily.   Yes [provider]  furosemide (LASIX) 80 MG tablet Take 1.5 tablets (120 mg total) by mouth 2 (two) times daily. 07/31/22  Yes Fay Records, MD  metoprolol tartrate (LOPRESSOR) 25 MG tablet Take 1 tablet (25 mg total) by mouth 2 (two) times daily. 04/14/22 09/12/22 Yes  Elodia Florence., MD  Multiple Vitamin (MULTIVITAMIN WITH MINERALS) TABS tablet Take 1 tablet by mouth daily.   Yes [provider]  nitroGLYCERIN (NITROSTAT) 0.4 MG SL tablet Place 1 tablet (0.4 mg total) under the tongue every 5 (five) minutes x 3 doses as needed for chest pain. 04/26/22  Yes Bhagat, Bhavinkumar, PA  nutrition supplement, JUVEN, (JUVEN) PACK Take 1 packet by mouth 2 (two) times daily between meals. 04/13/22  Yes Dondra Prader R, NP  nystatin (MYCOSTATIN/NYSTOP) powder Apply 1 Application topically 3 (three) times daily. 07/13/22  Yes Gwenlyn Perking, FNP  potassium chloride SA (KLOR-CON M) 20 MEQ tablet Take 2 tablets (40 mEq total) by mouth daily. 04/14/22  Yes Elodia Florence., MD  Tiotropium Bromide Monohydrate (SPIRIVA RESPIMAT) 1.25 MCG/ACT AERS Inhale 2 puffs into the lungs daily. 03/05/22  Yes Emokpae, Courage, MD  zinc sulfate 220 (50 Zn) MG capsule Take 1 capsule (220  mg total) by mouth daily. 04/14/22  Yes Suzan Slick, NP  fluconazole (DIFLUCAN) 150 MG tablet Take 1 tablet (150 mg total) by mouth once a week. Patient not taking: Reported on 09/12/2022 08/23/22   [name], [name], MD  sulfamethoxazole-trimethoprim (BACTRIM DS) 800-160 MG tablet Take 1 tablet by mouth 2 (two) times daily. Patient not taking: Reported on 09/12/2022 08/23/22   [name], [name], MD    Past Medical History: Past Medical History:  Diagnosis Date   A-fib [facility])    Acute on chronic diastolic CHF (congestive heart failure) ([place]) 07/21/2020   Coronary artery disease    Diabetes mellitus without complication (HCC)    Dyspnea    Dysrhythmia    A-fib   Hyperlipidemia    Hypertension    Metabolic syndrome    Prediabetes    Sleep apnea    STEMI (ST elevation myocardial infarction) [facility])     Past Surgical History: Past Surgical History:  Procedure Laterality Date   APPENDECTOMY     CHOLECYSTECTOMY N/A 03/02/2020   Procedure: LAPAROSCOPIC CHOLECYSTECTOMY;  Surgeon:  Aviva Signs, MD;  Location: [facility];  Service: General;  Laterality: N/A;   CORONARY STENT INTERVENTION N/A 04/30/2020   Procedure: CORONARY STENT INTERVENTION;  Surgeon: Martinique, Peter M, MD;  Location: [place] [facility];  Service: Cardiovascular;  Laterality: N/A;   CORONARY/GRAFT ACUTE MI REVASCULARIZATION N/A 04/30/2020   Procedure: Coronary/Graft Acute MI Revascularization;  Surgeon: Martinique, Peter M, MD;  Location: [place] [facility] LAB;  Service: Cardiovascular;  Laterality: N/A;   Eyelid Surgery Bilateral    HERNIA REPAIR     I & D EXTREMITY Left 04/06/2022   Procedure: LEFT LEG DEBRIDEMENT;  Surgeon: Newt Minion, MD;  Location: [place];  Service: Orthopedics;  Laterality: Left;   I & D EXTREMITY Left 04/04/2022   Procedure: LEFT LEG DEBRIDEMENT;  Surgeon: Newt Minion, MD;  Location: [place];  Service: Orthopedics;  Laterality: Left;   knee tendons repair     LEFT HEART CATH AND CORONARY ANGIOGRAPHY N/A 04/30/2020   Procedure: LEFT HEART CATH AND CORONARY ANGIOGRAPHY;  Surgeon: Martinique, Peter M, MD;  Location: [place] [facility];  Service: Cardiovascular;  Laterality: N/A;   ROTATOR CUFF REPAIR Bilateral    TONSILLECTOMY      Family History: Family History  Problem Relation Age of Onset   Cancer Mother        lungs   Coronary artery disease Mother    Diabetes Brother    Heart attack Brother    Diabetes Maternal Grandmother    Arthritis Father     Social History: Social History   Socioeconomic History   Marital status: Married    Spouse name: [name]   Number of children: 2   Years of education: 20   Highest education level: Doctorate  Occupational History   Occupation: Theme park manager    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  Tobacco Use   Smoking status: Former    Types: Cigarettes    Quit date: 04/02/1959    Years since quitting: 63.4   Smokeless tobacco: Never  Vaping Use   Vaping Use: Never used  Substance and Sexual  Activity   Alcohol use: No   Drug use: No   Sexual activity: Yes  Other Topics Concern   Not on file  Social History Narrative   Not on file   Social Determinants of Health   Financial  Resource Strain: Low Risk  (07/04/2022)   Overall Financial Resource Strain (CARDIA)    Difficulty of Paying Living Expenses: Not hard at all  Food Insecurity: No Food Insecurity (07/25/2022)   Hunger Vital Sign    Worried About Running Out of Food in the Last Year: Never true    Ran Out of Food in the Last Year: Never true  Transportation Needs: No Transportation Needs (07/25/2022)   PRAPARE - Hydrologist (Medical): No    Lack of Transportation (Non-Medical): No  Physical Activity: Insufficiently Active (07/04/2022)   Exercise Vital Sign    Days of Exercise per Week: 3 days    Minutes of Exercise per Session: 30 min  Stress: No Stress Concern Present (07/04/2022)   [place]    Feeling of Stress : Not at all  Social Connections: [place] (07/04/2022)   Social Connection and Isolation Panel [NHANES]    Frequency of Communication with Friends and Family: More than three times a week    Frequency of Social Gatherings with Friends and Family: More than three times a week    Attends Religious Services: More than 4 times per year    Active Member of [employer] or Organizations: Yes    Attends Music therapist: More than 4 times per year    Marital Status: Married    Allergies:  Allergies  Allergen Reactions   Flecainide     Per patient dizziness, shortness of breath, blurred vision   Rofecoxib Rash and Other (See Comments)    (Vioxx)    Objective:    Vital Signs:   Temp:  [97.7 F (36.5 C)-98.1 F (36.7 C)] 97.7 F (36.5 C) (02/22 0817) Pulse Rate:  [71-97] 91 (02/22 0817) Resp:  [16-20] 20 (02/22 0817) BP: (82-114)/(63-82) 95/70 (02/22 0817) SpO2:  [94 %-100 %] 96 % (02/22  0817) Weight:  [139.2 kg] 139.2 kg (02/22 0500) Last BM Date : 09/13/22  Weight change: Filed Weights   09/12/22 1850 09/13/22 0025 09/13/22 0500  Weight: (!) 139.2 kg (!) 139.2 kg (!) 139.2 kg    Intake/Output:   Intake/Output Summary (Last 24 hours) at 09/13/2022 1053 Last data filed at 09/13/2022 0823 Gross per 24 hour  Intake 437.97 ml  Output 950 ml  Net -512.03 ml      Physical Exam    General:  No resp difficulty HEENT: normal Neck: supple. JVP to jaw . Carotids 2+ bilat; no bruits. No lymphadenopathy or thyromegaly appreciated. Cor: PMI nondisplaced. Irregular rate & rhythm. No rubs, gallops or murmurs. Lungs: Rhonchi throughout. Abdomen: soft, nontender, nondistended. No hepatosplenomegaly. No bruits or masses. Good bowel sounds. Extremities: cool, no cyanosis, clubbing, rash, R and LLE 3+ edema with dressings on bilaterally.  Neuro: alert & orientedx3, cranial nerves grossly intact. moves all 4 extremities w/o difficulty. Affect pleasant   Telemetry    A Fib  80s   EKG   N/A   Labs   Basic Metabolic Panel: Recent Labs  Lab 09/11/22 1530 09/12/22 2101 09/13/22 0721  NA 130* 131* 130*  K 5.4* 4.5 4.8  CL 95* 100 99  CO2 18* 22 23  GLUCOSE 112* 131* 109*  BUN 49* 54* 54*  CREATININE 1.30* 1.33* 1.62*  CALCIUM 9.5 9.4 9.5  MG  --  2.0  --     Liver Function Tests: Recent Labs  Lab 09/11/22 1530 09/12/22 2101  AST 20 19  ALT  12 13  ALKPHOS 84 69  BILITOT 0.6 0.9  PROT 6.7 6.8  ALBUMIN 3.6* 3.1*   No results for input(s): "LIPASE", "AMYLASE" in the last 168 hours. No results for input(s): "AMMONIA" in the last 168 hours.  CBC: Recent Labs  Lab 09/11/22 1530 09/12/22 2101  WBC 7.6 6.3  NEUTROABS  --  4.4  HGB 12.4* 11.8*  HCT 36.6* 36.6*  MCV 86 90.6  PLT 282 258    Cardiac Enzymes: No results for input(s): "CKTOTAL", "CKMB", "CKMBINDEX", "TROPONINI" in the last 168 hours.  BNP: BNP (last 3 results) Recent Labs     11/20/21 1409 04/06/22 1253 09/12/22 2101  BNP 485.0* 682.5* 577.3*    ProBNP (last 3 results) Recent Labs    08/15/22 1049 09/04/22 1404 09/11/22 1530  PROBNP 5,315* 11,525* 8,444*     CBG: No results for input(s): "GLUCAP" in the last 168 hours.  Coagulation Studies: No results for input(s): "LABPROT", "INR" in the last 72 hours.   Imaging   DG Chest Port 1 View  Result Date: 09/13/2022 CLINICAL DATA:  Shortness of breath, cough, and congestion EXAM: PORTABLE CHEST 1 VIEW COMPARISON:  Chest radiograph dated 07/30/2022 FINDINGS: Low lung volumes. Patchy right basilar opacities. Small bilateral pleural effusions. No pneumothorax. Enlarged cardiomediastinal silhouette is likely projectional. The visualized skeletal structures are unremarkable. IMPRESSION: 1. Patchy right basilar opacities, which may represent atelectasis, aspiration, or pneumonia. 2. Small bilateral pleural effusions. Electronically Signed   By: Darrin Nipper M.D.   On: 09/13/2022 08:23     Medications:     Current Medications:  atorvastatin  80 mg Oral Daily   clopidogrel  75 mg Oral Q breakfast   metoprolol tartrate  25 mg Oral BID   potassium chloride  40 mEq Oral BID   sodium chloride flush  3 mL Intravenous Q12H   umeclidinium bromide  1 puff Inhalation Daily    Infusions:  sodium chloride     furosemide 120 mg (09/13/22 0823)   heparin        Patient Profile   Ryan Garner is 77 year old with a history of CAD, STEMI 2021, DES to LCx, permanent A fib, severe Ryan, HTN, HLD, sleep apnea, Ryan, chronic lymphedema, and HFpEF.   Admitted with A/C HFpEF.   Assessment/Plan   1. A/C HFpEF -2023  Echo preserved EF 55-60% Severe Ryan -Massive volume overload. Hypotensive. On exam cool + wet.  Poor response to 120 mg IV lasix. Suspect low output. Will need to add Dobutamine.  -Stop BB.  - Place PICC. Check CVP and CO-OX. Start Dobutamine 2.5 mcg.  - Stop lasix bolus. Start lasix 15 mg per hour.  - Follow  closely.   2. Ryan , Severe -Echo 09/2021 - Ryan Peak grad 86, mean grad 64 -Once diuresed will need TEE.   3. AKI on CKD Stage IIIa -Creatinine on admit 1.3-->today 1.6  - Follow renal function closely .   3. Permanent A fib  -Stop bb. Of note says he has not been taking Metoprolol because it makes him itch. I will update allergy list.  - Rate controlled.  - On Heparin drip.   4. CAD -No chest pain.  -DES LCx 2021  -On plavix 75 mg daily  -Continue atorvastatin   5. Pleural Effusion R>L  I am unable to look at CXR. Continue to diurese.   6. Lower extremity wounds Followed by Dr Sharol Given. Will need WOC.   7. GOC Discussed code status. He  does want intubation but ok with Bipap and CPR.  -  Length of Stay: 1  Amy Clegg, NP  09/13/2022, 10:53 AM  Advanced Heart Failure Team Pager [phone] (M-F; 7a - 5p)  Please contact [facility] for night-coverage after hours (4p -7a ) and weekends on [email or web address]  Patient seen with NP, agree with the above note.   Patient sent in as a direct admit form Dr. Harrington Challenger for marked volume overload in setting of severe Ryan.  Diuresis so far has been limited by AKI (cardiorenal syndrome) with creatinine up to 1.62.    Patient is comfortable at rest.   General: NAD Neck: JVP 16+ cm, no thyromegaly or thyroid nodule.  Lungs: Decreased at bases.  CV: Nondisplaced PMI.  Heart regular S1/S2, no S3/S4, 3/6 HSM apex.  2+ edema to thighs.  No carotid bruit.  Unable to palpate pedal pulses.  Abdomen: Soft, nontender, no hepatosplenomegaly, no distention.  Skin: Intact without lesions or rashes.  Neurologic: Alert and oriented x 3.  Psych: Normal affect. Extremities: Lower legs wrapped, has chronic LE wounds.  HEENT: Normal.   1. Acute on chronic HFpEF: With prominent RV dysfunction.  Echo in 9/23 showed EF 50-55%, D-shaped septum, moderate RV enlargement with moderate RV dysfunction, PASP 85, severe biatrial enlargement, moderate AS with mean gradient 17  mmHg, IVC dilated. Patient is markedly volume overloaded on exam.  Creatinine rising with attempt at diuresis, 1.63 today.  SBP 80s-100s.  - Place PICC to follow CVP and co-ox.  - Add dobutamine 2.5 empirically for RV support.  - Start Lasix gtt 15 mg/hr.   - LHC/RHC planned when better diuresed and creatinine stabilized.  2. Mitral regurgitation: Severe Ryan by echo.  Mechanism uncertain, may be atrial functional Ryan with severe LAE. Suspect this is driving pulmonary hypertension and RV failure.  - TEE when he is adequately diuresed to assess mitral valve.  - May be candidate for Mitraclip but will need need significant optimization.  3. AKI on CKD 3: Suspect cardiorenal syndrome.   - Adding dobutamine as above.  4. Atrial fibrillation: Permanent, has been seen by EP in the past.  - Continue heparin gtt for now.  5. CAD: No chest pain.  Had DES to mLCx in 10/21.   - Consider coronary angiography ?pre-Mitraclip if creatinine comes down.  - Continue atorvastatin.  - He has been on Plavix.  He is almost 3 years post-PCI, so not sure if he really needs to continue this given anticoagulation use.  6. LE wounds: Followed as outpatient by Dr. Sharol Given.   - Wound care - peripheral arterial dopplers  Loralie Champagne 09/13/2022 3:20 PM

## 2022-09-13 NOTE — TOC Progression Note (Signed)
Transition of Care [facility]) - Progression Note    Patient Details  Name: Ryan Garner MRN: [id number] Date of Birth: 1946-06-05  Transition of [facility]) CM/SW Contact  Zenon Mayo, RN Phone Number: 09/13/2022, 10:17 AM  Clinical Narrative:    from home alone, a/c HF, afib, edema with weaping in LE's. Conts on iv lasix, wheezing, plan for potential TEE. Active with [facility] and HHPT. TOC following.         Expected Discharge Plan and Services                                               Social Determinants of Health (SDOH) Interventions SDOH Screenings   Food Insecurity: No Food Insecurity (07/25/2022)  Housing: Low Risk  (07/04/2022)  Transportation Needs: No Transportation Needs (07/25/2022)  Utilities: Not At Risk (07/04/2022)  Alcohol Screen: Low Risk  (07/04/2022)  Depression (PHQ2-9): Low Risk  (08/23/2022)  Financial Resource Strain: Low Risk  (07/04/2022)  Physical Activity: Insufficiently Active (07/04/2022)  Social Connections: Socially Integrated (07/04/2022)  Stress: No Stress Concern Present (07/04/2022)  Tobacco Use: Medium Risk (09/11/2022)    Readmission Risk Interventions     No data to display

## 2022-09-13 NOTE — Progress Notes (Addendum)
ANTICOAGULATION CONSULT NOTE - Initial Consult  Pharmacy Consult for Heparin Indication: atrial fibrillation  Allergies  Allergen Reactions   Flecainide     Per patient dizziness, shortness of breath, blurred vision   Metoprolol Tartrate Itching   Rofecoxib Rash and Other (See Comments)    (Vioxx)    Patient Measurements: Height: 5' 8"$  (172.7 cm) Weight: (!) 139.2 kg (306 lb 14.1 oz) IBW/kg (Calculated) : 68.4 Heparin Dosing Weight: 100 kg  Vital Signs: Temp: 98.8 F (37.1 C) (02/22 1600) Temp Source: Oral (02/22 1600) BP: 98/62 (02/22 1600) Pulse Rate: 67 (02/22 1600)  Labs: Recent Labs    09/11/22 1530 09/12/22 2101 09/13/22 0721 09/13/22 1853  HGB 12.4* 11.8*  --   --   HCT 36.6* 36.6*  --   --   PLT 282 258  --   --   APTT  --   --  >200* 139*  HEPARINUNFRC  --   --  >1.10* >1.10*  CREATININE 1.30* 1.33* 1.62*  --      Estimated Creatinine Clearance: 51.4 mL/min (A) (by C-G formula based on SCr of 1.62 mg/dL (H)).   Medical History: Past Medical History:  Diagnosis Date   A-fib [facility])    Acute on chronic diastolic CHF (congestive heart failure) ([place]) 07/21/2020   Coronary artery disease    Diabetes mellitus without complication (HCC)    Dyspnea    Dysrhythmia    A-fib   Hyperlipidemia    Hypertension    Metabolic syndrome    Prediabetes    Sleep apnea    STEMI (ST elevation myocardial infarction) ([place])     Medications:  Medications Prior to Admission  Medication Sig Dispense Refill Last Dose   albuterol (VENTOLIN HFA) 108 (90 Base) MCG/ACT inhaler Inhale 2 puffs into the lungs every 6 (six) hours as needed for wheezing or shortness of breath. 8 g 0 Past Month   apixaban (ELIQUIS) 5 MG TABS tablet Take 1 tablet (5 mg total) by mouth 2 (two) times daily. 180 tablet 1 09/12/2022 at 0900   ascorbic acid (VITAMIN C) 1000 MG tablet Take 1 tablet (1,000 mg total) by mouth daily. 60 tablet 1 09/12/2022   atorvastatin (LIPITOR) 80 MG tablet Take 1 tablet  by mouth once daily 90 tablet 3 09/12/2022   clopidogrel (PLAVIX) 75 MG tablet Take 1 tablet (75 mg total) by mouth daily with breakfast. 90 tablet 2 09/12/2022   cyanocobalamin (VITAMIN B12) 1000 MCG tablet Take 1,000 mcg by mouth daily.   09/12/2022   furosemide (LASIX) 80 MG tablet Take 1.5 tablets (120 mg total) by mouth 2 (two) times daily.   09/12/2022   metoprolol tartrate (LOPRESSOR) 25 MG tablet Take 1 tablet (25 mg total) by mouth 2 (two) times daily. 60 tablet 0 09/12/2022 at 0800   Multiple Vitamin (MULTIVITAMIN WITH MINERALS) TABS tablet Take 1 tablet by mouth daily.   09/12/2022   nitroGLYCERIN (NITROSTAT) 0.4 MG SL tablet Place 1 tablet (0.4 mg total) under the tongue every 5 (five) minutes x 3 doses as needed for chest pain. 25 tablet 2 Past Month   nutrition supplement, JUVEN, (JUVEN) PACK Take 1 packet by mouth 2 (two) times daily between meals. 60 packet 1 09/12/2022   nystatin (MYCOSTATIN/NYSTOP) powder Apply 1 Application topically 3 (three) times daily. 15 g 0 09/12/2022   potassium chloride SA (KLOR-CON M) 20 MEQ tablet Take 2 tablets (40 mEq total) by mouth daily.   09/12/2022   Tiotropium Bromide Monohydrate (SPIRIVA  RESPIMAT) 1.25 MCG/ACT AERS Inhale 2 puffs into the lungs daily. 4 g 3 09/12/2022   zinc sulfate 220 (50 Zn) MG capsule Take 1 capsule (220 mg total) by mouth daily. 60 capsule 0 09/12/2022   fluconazole (DIFLUCAN) 150 MG tablet Take 1 tablet (150 mg total) by mouth once a week. (Patient not taking: Reported on 09/12/2022) 2 tablet 0 Completed Course   sulfamethoxazole-trimethoprim (BACTRIM DS) 800-160 MG tablet Take 1 tablet by mouth 2 (two) times daily. (Patient not taking: Reported on 09/12/2022) 20 tablet 0 Completed Course    Assessment: 77 y.o. M presents for TEE and probable R/L heart cath on 2/23. Pt on apixaban PTA for afib - last dose 2/21 0900. To hold apixaban and start heparin bridge. Apixaban will be affecting heparin level so will utilize aPTT for monitoring  until levels correlate.  Morning level resulted with aPTT > 200 and HL > 1.10. Spoke with nursing confirmed that level was drawn from separate arm from heparin. No issues with bleeding.   PTT still came back supratherapeutic at 139. Pt had some mild oozing earlier after IV line pull but resolved not. We will hold and decrease rate again.   Goal of Therapy:  Heparin level 0.3-0.7 units/ml aPTT 66-102 seconds Monitor platelets by anticoagulation protocol: Yes   Plan:  Hold heparin infusion x 1 hour.  Decrease heparin to 1200u/hr.  Will f/u heparin level and aPTT in AM Daily heparin level, aPTT, and CBC.  F/u plans for TEE and RHC/LHC.   Onnie Boer, PharmD, BCIDP, AAHIVP, CPP Infectious Disease Pharmacist 09/13/2022 7:51 PM

## 2022-09-13 NOTE — Telephone Encounter (Signed)
Called central sched and Endo to "pencil" the pt in for TEE tomorrow now that he has been admitted and they report that the sched is full but the will manage from there end now that he is an inpatient.

## 2022-09-13 NOTE — Consult Note (Signed)
WOC Nurse Consult Note:Patient is seen by Dr. Sharol Given for lymphedema and venous ulcers; also followed by home health.  In Dr. Gavin Potters notes it mentions lymphedema pumps and 3 layer compression wraps for use at home  Reason for Consult: L leg wounds  Wound type: Venous  Pressure Injury POA: NA, not pressure related  Measurement:  1.  L pretibial 2 cms x 3 cms x 0.1 cms 100% pink and moist 2.  Scattered areas (approximately 6) of full thickness skin loss over left anterior lower leg; venous ulcers, all measuring approximately 0.5 cms x 0.5 cms 50% pink moist 50% yellow   Drainage (amount, consistency, odor) all open areas actively weeping serous fluid  R leg with no open active skin loss but noted to be weeping as well.  Patient admitted for fluid overloa/CHF.  Periwound: lymphedema  Dressing procedure/placement/frequency:  Left leg pretibial full thickness and all other open areas clean with NS, dry, place a  small piece of Calcium Alginate Kellie Simmering [phone]) cut to fit wound bed to help absorb drainage, cover with ABD pads, kerlix wrap from just above toes to right below knee and secure with 4" Ace bandage Kellie Simmering [phone]) wrapped in same fashion.   R leg clean with soap and water, cover anterior and posterior leg with Xeroform Kellie Simmering [phone]) gauze, ABD pads, wrap from above toes to right below knee in Kerlix and secure with ACE wrap applied same fashion.    Lymphedema  is outside the scope of practice for the [facility]. We do not not have lymphedema services for manual massage and compression wraps inpatient.  Discussed with bedside nurse.      WOC will not follow at this time.  Re-consult if further needs arise.    Thank you,    Gisela Lea MSN, RN-BC, [employer]

## 2022-09-14 ENCOUNTER — Inpatient Hospital Stay (HOSPITAL_COMMUNITY): Payer: Medicare Other

## 2022-09-14 DIAGNOSIS — I4821 Permanent atrial fibrillation: Secondary | ICD-10-CM | POA: Diagnosis not present

## 2022-09-14 DIAGNOSIS — I359 Nonrheumatic aortic valve disorder, unspecified: Secondary | ICD-10-CM | POA: Diagnosis not present

## 2022-09-14 DIAGNOSIS — L0390 Cellulitis, unspecified: Secondary | ICD-10-CM

## 2022-09-14 DIAGNOSIS — I340 Nonrheumatic mitral (valve) insufficiency: Secondary | ICD-10-CM

## 2022-09-14 DIAGNOSIS — I5033 Acute on chronic diastolic (congestive) heart failure: Secondary | ICD-10-CM | POA: Diagnosis not present

## 2022-09-14 LAB — BASIC METABOLIC PANEL
Anion gap: 8 (ref 5–15)
BUN: 57 mg/dL — ABNORMAL HIGH (ref 8–23)
CO2: 22 mmol/L (ref 22–32)
Calcium: 9.1 mg/dL (ref 8.9–10.3)
Chloride: 98 mmol/L (ref 98–111)
Creatinine, Ser: 1.5 mg/dL — ABNORMAL HIGH (ref 0.61–1.24)
GFR, Estimated: 47 mL/min — ABNORMAL LOW (ref >60)
Glucose, Bld: 112 mg/dL — ABNORMAL HIGH (ref 70–99)
Potassium: 4.5 mmol/L (ref 3.5–5.1)
Sodium: 128 mmol/L — ABNORMAL LOW (ref 135–145)

## 2022-09-14 LAB — CBC
HCT: 33 % — ABNORMAL LOW (ref 39.0–52.0)
Hemoglobin: 11.3 g/dL — ABNORMAL LOW (ref 13.0–17.0)
MCH: 30.1 pg (ref 26.0–34.0)
MCHC: 34.2 g/dL (ref 30.0–36.0)
MCV: 88 fL (ref 80.0–100.0)
Platelets: 211 10*3/uL (ref 150–400)
RBC: 3.75 MIL/uL — ABNORMAL LOW (ref 4.22–5.81)
RDW: 16.4 % — ABNORMAL HIGH (ref 11.5–15.5)
WBC: 5.4 10*3/uL (ref 4.0–10.5)
nRBC: 0 % (ref 0.0–0.2)

## 2022-09-14 LAB — COOXEMETRY PANEL
Carboxyhemoglobin: 1.3 % (ref 0.5–1.5)
Methemoglobin: <0.7 % (ref 0.0–1.5)
O2 Saturation: 79.1 %
Total hemoglobin: 11.4 g/dL — ABNORMAL LOW (ref 12.0–16.0)

## 2022-09-14 LAB — ECHOCARDIOGRAM COMPLETE
AR max vel: 1.2 cm2
AV Area VTI: 1.2 cm2
AV Area mean vel: 1.17 cm2
AV Mean grad: 21 mmHg
AV Peak grad: 34.6 mmHg
Ao pk vel: 2.94 m/s
Height: 68 in
MV M vel: 4.64 m/s
MV Peak grad: 85.9 mmHg
Radius: 1.57 cm
S' Lateral: 4.4 cm
Weight: 4917.14 oz

## 2022-09-14 LAB — APTT
aPTT: >200 seconds (ref 24–36)
aPTT: 39 seconds — ABNORMAL HIGH (ref 24–36)

## 2022-09-14 LAB — HEPARIN LEVEL (UNFRACTIONATED): Heparin Unfractionated: >1.1 IU/mL — ABNORMAL HIGH (ref 0.30–0.70)

## 2022-09-14 LAB — VAS US ABI WITH/WO TBI
Left ABI: 1.06
Right ABI: 1.02

## 2022-09-14 MED ORDER — HEPARIN (PORCINE) 25000 UT/250ML-% IV SOLN: 1000.0000 [IU]/h | INTRAVENOUS | Status: DC

## 2022-09-14 MED ORDER — METOLAZONE 5 MG PO TABS
5.0000 mg | ORAL_TABLET | Freq: Once | ORAL | Status: AC
  Administered 2022-09-14: 5 mg via ORAL
  Filled 2022-09-14: qty 1

## 2022-09-14 MED ORDER — PERFLUTREN LIPID MICROSPHERE
1.0000 mL | INTRAVENOUS | Status: AC | PRN
  Administered 2022-09-14: 4 mL via INTRAVENOUS

## 2022-09-14 MED ORDER — HEPARIN (PORCINE) 25000 UT/250ML-% IV SOLN
1250.0000 [IU]/h | INTRAVENOUS | Status: DC
  Administered 2022-09-14: 900 [IU]/h via INTRAVENOUS
  Administered 2022-09-15 – 2022-09-16 (×2): 1250 [IU]/h via INTRAVENOUS
  Filled 2022-09-14 (×3): qty 250

## 2022-09-14 NOTE — Progress Notes (Signed)
Orthopedic Tech Progress Note Patient Details:  Ryan Garner February 03, 1946 [id number]  Ortho Devices Type of Ortho Device: Haematologist Ortho Device/Splint Location: BLE Ortho Device/Splint Interventions: Application, Ordered, Adjustment   Post Interventions Patient Tolerated: Well  Vernona Rieger 09/14/2022, 5:05 PM

## 2022-09-14 NOTE — Progress Notes (Signed)
ABI exam completed.   Please see CV Procedures for preliminary results.  Vick Filter, RVT  3:53 PM 09/14/22

## 2022-09-14 NOTE — Progress Notes (Signed)
Echocardiogram 2D Echocardiogram has been performed.  Ronny Flurry 09/14/2022, 10:02 AM

## 2022-09-14 NOTE — Plan of Care (Signed)

## 2022-09-14 NOTE — Progress Notes (Addendum)
RN resumed heparin gtt per order. RN provided education to patient about bleeding risk and to monitor any nose bleeds or any potential sputum expectorants to be bloody, to notify RN. Patient verbalize understanding.

## 2022-09-14 NOTE — Progress Notes (Signed)
Rounding Note    Patient Name: Ryan Garner Date of Encounter: 09/14/2022  [place] Cardiologist: Dorris Carnes, MD   Subjective   Laying slightly upright in bed his echocardiogram is being performed.  NYHA class IV.  Inpatient Medications    Scheduled Meds:  atorvastatin  80 mg Oral Daily   Chlorhexidine Gluconate Cloth  6 each Topical Daily   clopidogrel  75 mg Oral Q breakfast   potassium chloride  40 mEq Oral BID   sodium chloride flush  3 mL Intravenous Q12H   umeclidinium bromide  1 puff Inhalation Daily   Continuous Infusions:  sodium chloride     DOBUTamine 2.5 mcg/kg/min (09/13/22 2229)   furosemide (LASIX) 200 mg in dextrose 5 % 100 mL (2 mg/mL) infusion 15 mg/hr (09/13/22 2358)   PRN Meds: sodium chloride, acetaminophen, albuterol, ondansetron (ZOFRAN) IV, sodium chloride flush   Vital Signs    Vitals:   09/14/22 0212 09/14/22 0404 09/14/22 0559 09/14/22 0743  BP:  96/65  (!) 100/59  Pulse:  79  77  Resp: '16 16  16  '$ Temp:  (!) 97.3 F (36.3 C)  (!) 97.3 F (36.3 C)  TempSrc:  Oral  Oral  SpO2:  97%  97%  Weight:   (!) 139.4 kg   Height:        Intake/Output Summary (Last 24 hours) at 09/14/2022 0829 Last data filed at 09/14/2022 [id number] Gross per 24 hour  Intake 990.06 ml  Output 1050 ml  Net -59.94 ml      09/14/2022    5:59 AM 09/13/2022    5:00 AM 09/13/2022   12:25 AM  Last 3 Weights  Weight (lbs) 307 lb 5.1 oz 306 lb 14.1 oz 306 lb 14.1 oz  Weight (kg) 139.4 kg 139.2 kg 139.2 kg      Telemetry    A-fib heart rate controlled 60s to 80s- Personally Reviewed  ECG    No new- Personally Reviewed  Physical Exam   Alert and oriented x 3, rhonchi bilaterally, irregularly irregular, normal rate, 3/6 apical holosystolic murmur, positive JVD, lymphedema, PICC line site with surrounding ecchymosis, prior coagulated blood underneath dressing  Labs    High Sensitivity Troponin:  No results for input(s): "TROPONINIHS" in the last  720 hours.   Chemistry Recent Labs  Lab 09/11/22 1530 09/12/22 2101 09/13/22 0721 09/14/22 0420  NA 130* 131* 130* 128*  K 5.4* 4.5 4.8 4.5  CL 95* 100 99 98  CO2 18* '22 23 22  '$ GLUCOSE 112* 131* 109* 112*  BUN 49* 54* 54* 57*  CREATININE 1.30* 1.33* 1.62* 1.50*  CALCIUM 9.5 9.4 9.5 9.1  MG  --  2.0  --   --   PROT 6.7 6.8  --   --   ALBUMIN 3.6* 3.1*  --   --   AST 20 19  --   --   ALT 12 13  --   --   ALKPHOS 84 69  --   --   BILITOT 0.6 0.9  --   --   GFRNONAA  --  55* 43* 47*  ANIONGAP  --  '9 8 8    '$ Lipids No results for input(s): "CHOL", "TRIG", "HDL", "LABVLDL", "LDLCALC", "CHOLHDL" in the last 168 hours.  Hematology Recent Labs  Lab 09/11/22 1530 09/12/22 2101 09/14/22 0445  WBC 7.6 6.3 5.4  RBC 4.27 4.04* 3.75*  HGB 12.4* 11.8* 11.3*  HCT 36.6* 36.6* 33.0*  MCV 86 90.6  88.0  MCH 29.0 29.2 30.1  MCHC 33.9 32.2 34.2  RDW 13.8 16.1* 16.4*  PLT 282 258 211   Thyroid No results for input(s): "TSH", "FREET4" in the last 168 hours.  BNP Recent Labs  Lab 09/11/22 1530 09/12/22 2101  BNP  --  577.3*  PROBNP 8,444*  --     DDimer No results for input(s): "DDIMER" in the last 168 hours.   Radiology    DG CHEST PORT 1 VIEW  Result Date: 09/13/2022 CLINICAL DATA:  Status post PICC line placement EXAM: PORTABLE CHEST 1 VIEW COMPARISON:  09/13/2022 at 11:01 a.m. FINDINGS: Stable cardiomegaly. New right PICC tip at the superior cavoatrial junction. Similar layering bilateral pleural effusions and associated atelectasis/consolidation. No pneumothorax. No acute osseous abnormality. IMPRESSION: 1. New right PICC tip at the superior cavoatrial junction. 2. Similar layering bilateral pleural effusions and associated atelectasis/consolidation. Electronically Signed   By: Placido Sou M.D.   On: 09/13/2022 17:38   [place] EKG SITE RITE  Result Date: 09/13/2022 If [facility] image not attached, placement could not be confirmed due to current cardiac rhythm.  DG CHEST PORT  1 VIEW  Result Date: 09/13/2022 CLINICAL DATA:  Chest congestion EXAM: PORTABLE CHEST 1 VIEW COMPARISON:  Same day chest radiograph FINDINGS: Stable heart size. Low lung volumes. Pulmonary vascular congestion. Right greater than left pleural effusions with associated bibasilar airspace opacities, also worse on the right. No pneumothorax. IMPRESSION: Stable chest radiograph. Right greater than left pleural effusions with associated bibasilar airspace opacities, also worse on the right. Electronically Signed   By: Davina Poke D.O.   On: 09/13/2022 11:27   DG Chest Port 1 View  Result Date: 09/13/2022 CLINICAL DATA:  Shortness of breath, cough, and congestion EXAM: PORTABLE CHEST 1 VIEW COMPARISON:  Chest radiograph dated 07/30/2022 FINDINGS: Low lung volumes. Patchy right basilar opacities. Small bilateral pleural effusions. No pneumothorax. Enlarged cardiomediastinal silhouette is likely projectional. The visualized skeletal structures are unremarkable. IMPRESSION: 1. Patchy right basilar opacities, which may represent atelectasis, aspiration, or pneumonia. 2. Small bilateral pleural effusions. Electronically Signed   By: Darrin Nipper M.D.   On: 09/13/2022 08:23    Cardiac Studies   Echocardiogram currently being done/updated.  Patient Profile     77 y.o. male with acute on chronic diastolic heart failure, severe mitral regurgitation, RV pressure and volume overload, lower extremity lymphedema, hypotension requiring dobutamine, PICC line, longstanding persistent atrial fibrillation  Assessment & Plan    -Plan is for right and left heart catheterization, assess mitral valve, question candidacy for MitraClip -Once diuresed TEE -Stopping heparin IV for now, has longstanding persistent atrial fibrillation however.  Ecchymosis/oozing surrounding PICC line insertion.  IV team to monitor.  Had been on longstanding Plavix as well. -Wound care for lower extremity wounds/lymphedema -Appreciate advanced  heart failure team.  Last Co. ox 79.1. -Serum sodium 128 compatible with heart failure -Acute kidney injury slightly improved with dobutamine infusion.  Blood pressure overnight 0000000 to [id number] systolic.   For questions or updates, please contact [place] Please consult [email or web address] for contact info under        Signed, Candee Furbish, MD  09/14/2022, 8:29 AM

## 2022-09-14 NOTE — Care Management Important Message (Signed)
Important Message  Patient Details  Name: KAMERION MCINTEE MRN: [id number] Date of Birth: Dec 08, 1945   Medicare Important Message Given:  Yes     Shelda Altes 09/14/2022, 8:47 AM

## 2022-09-14 NOTE — Progress Notes (Signed)
ANTICOAGULATION CONSULT NOTE  Pharmacy Consult for Heparin Indication: atrial fibrillation  Allergies  Allergen Reactions   Flecainide     Per patient dizziness, shortness of breath, blurred vision   Metoprolol Tartrate Itching   Rofecoxib Rash and Other (See Comments)    (Vioxx)    Patient Measurements: Height: '5\' 8"'$  (172.7 cm) Weight: (!) 139.4 kg (307 lb 5.1 oz) IBW/kg (Calculated) : 68.4 Heparin Dosing Weight: 100 kg  Vital Signs: Temp: 97.3 F (36.3 C) (02/23 0404) Temp Source: Oral (02/23 0404) BP: 96/65 (02/23 0404) Pulse Rate: 79 (02/23 0404)  Labs: Recent Labs    09/11/22 1530 09/12/22 2101 09/13/22 0721 09/13/22 1853 09/14/22 0420 09/14/22 0445  HGB 12.4* 11.8*  --   --   --  11.3*  HCT 36.6* 36.6*  --   --   --  33.0*  PLT 282 258  --   --   --  211  APTT  --   --  >200* 139*  --  >200*  HEPARINUNFRC  --   --  >1.10* >1.10*  --  >1.10*  CREATININE 1.30* 1.33* 1.62*  --  1.50*  --      Estimated Creatinine Clearance: 55.6 mL/min (A) (by C-G formula based on SCr of 1.5 mg/dL (H)).   Medical History: Past Medical History:  Diagnosis Date   A-fib [facility])    Acute on chronic diastolic CHF (congestive heart failure) ([place]) 07/21/2020   Coronary artery disease    Diabetes mellitus without complication (HCC)    Dyspnea    Dysrhythmia    A-fib   Hyperlipidemia    Hypertension    Metabolic syndrome    Prediabetes    Sleep apnea    STEMI (ST elevation myocardial infarction) ([place])     Medications:  Medications Prior to Admission  Medication Sig Dispense Refill Last Dose   albuterol (VENTOLIN HFA) 108 (90 Base) MCG/ACT inhaler Inhale 2 puffs into the lungs every 6 (six) hours as needed for wheezing or shortness of breath. 8 g 0 Past Month   apixaban (ELIQUIS) 5 MG TABS tablet Take 1 tablet (5 mg total) by mouth 2 (two) times daily. 180 tablet 1 09/12/2022 at 0900   ascorbic acid (VITAMIN C) 1000 MG tablet Take 1 tablet (1,000 mg total) by mouth daily.  60 tablet 1 09/12/2022   atorvastatin (LIPITOR) 80 MG tablet Take 1 tablet by mouth once daily 90 tablet 3 09/12/2022   clopidogrel (PLAVIX) 75 MG tablet Take 1 tablet (75 mg total) by mouth daily with breakfast. 90 tablet 2 09/12/2022   cyanocobalamin (VITAMIN B12) 1000 MCG tablet Take 1,000 mcg by mouth daily.   09/12/2022   furosemide (LASIX) 80 MG tablet Take 1.5 tablets (120 mg total) by mouth 2 (two) times daily.   09/12/2022   metoprolol tartrate (LOPRESSOR) 25 MG tablet Take 1 tablet (25 mg total) by mouth 2 (two) times daily. 60 tablet 0 09/12/2022 at 0800   Multiple Vitamin (MULTIVITAMIN WITH MINERALS) TABS tablet Take 1 tablet by mouth daily.   09/12/2022   nitroGLYCERIN (NITROSTAT) 0.4 MG SL tablet Place 1 tablet (0.4 mg total) under the tongue every 5 (five) minutes x 3 doses as needed for chest pain. 25 tablet 2 Past Month   nutrition supplement, JUVEN, (JUVEN) PACK Take 1 packet by mouth 2 (two) times daily between meals. 60 packet 1 09/12/2022   nystatin (MYCOSTATIN/NYSTOP) powder Apply 1 Application topically 3 (three) times daily. 15 g 0 09/12/2022   potassium  chloride SA (KLOR-CON M) 20 MEQ tablet Take 2 tablets (40 mEq total) by mouth daily.   09/12/2022   Tiotropium Bromide Monohydrate (SPIRIVA RESPIMAT) 1.25 MCG/ACT AERS Inhale 2 puffs into the lungs daily. 4 g 3 09/12/2022   zinc sulfate 220 (50 Zn) MG capsule Take 1 capsule (220 mg total) by mouth daily. 60 capsule 0 09/12/2022   fluconazole (DIFLUCAN) 150 MG tablet Take 1 tablet (150 mg total) by mouth once a week. (Patient not taking: Reported on 09/12/2022) 2 tablet 0 Completed Course   sulfamethoxazole-trimethoprim (BACTRIM DS) 800-160 MG tablet Take 1 tablet by mouth 2 (two) times daily. (Patient not taking: Reported on 09/12/2022) 20 tablet 0 Completed Course    Assessment: 77 y.o. M presents for TEE and probable R/L heart cath on 2/23. Pt on apixaban PTA for afib - last dose 2/21 0900. To hold apixaban and start heparin bridge.  Apixaban will be affecting heparin level so will utilize aPTT for monitoring until levels correlate.  Morning level resulted with aPTT > 200 and HL > 1.10. Spoke with nursing confirmed that level was drawn from separate arm from heparin. No issues with bleeding.   2/23 AM update:  aPTT elevated, RN states lab drawn from arm opposite of heparin infusion  Goal of Therapy:  Heparin level 0.3-0.7 units/ml aPTT 66-102 seconds Monitor platelets by anticoagulation protocol: Yes   Plan:  Hold heparin infusion x 1 hour.  Decrease heparin to 1000 units/hr. 1600 aPTT/heparin level.  Narda Bonds, PharmD, BCPS Clinical Pharmacist Phone: [phone]

## 2022-09-14 NOTE — Progress Notes (Incomplete)
Rounding Note    Patient Name: Ryan Garner Date of Encounter: 09/14/2022  [place] Cardiologist: Dorris Carnes, MD ***  Subjective   ***  Inpatient Medications    Scheduled Meds:  atorvastatin  80 mg Oral Daily   Chlorhexidine Gluconate Cloth  6 each Topical Daily   potassium chloride  40 mEq Oral BID   sodium chloride flush  3 mL Intravenous Q12H   umeclidinium bromide  1 puff Inhalation Daily   Continuous Infusions:  sodium chloride     DOBUTamine 2.5 mcg/kg/min (09/14/22 0836)   furosemide (LASIX) 200 mg in dextrose 5 % 100 mL (2 mg/mL) infusion 20 mg/hr (09/14/22 1222)   heparin 900 Units/hr (09/14/22 1621)   PRN Meds: sodium chloride, acetaminophen, albuterol, ondansetron (ZOFRAN) IV, sodium chloride flush   Vital Signs    Vitals:   09/14/22 0832 09/14/22 1139 09/14/22 1617 09/14/22 2044  BP:  102/71 114/72 103/77  Pulse:  81 89   Resp:  '16 16 18  '$ Temp:  (!) 97.4 F (36.3 C) 97.9 F (36.6 C) (!) 97.5 F (36.4 C)  TempSrc:  Oral Oral Oral  SpO2: 97% 98% 100% 98%  Weight:      Height:        Intake/Output Summary (Last 24 hours) at 09/14/2022 2052 Last data filed at 09/14/2022 2046 Gross per 24 hour  Intake 1195.6 ml  Output 2601 ml  Net -1405.4 ml      09/14/2022    5:59 AM 09/13/2022    5:00 AM 09/13/2022   12:25 AM  Last 3 Weights  Weight (lbs) 307 lb 5.1 oz 306 lb 14.1 oz 306 lb 14.1 oz  Weight (kg) 139.4 kg 139.2 kg 139.2 kg      Telemetry    *** - Personally Reviewed  ECG    *** - Personally Reviewed  Physical Exam  *** GEN: No acute distress.   Neck: No JVD Cardiac: RRR, no murmurs, rubs, or gallops.  Respiratory: Clear to auscultation bilaterally. GI: Soft, nontender, non-distended  MS: No edema; No deformity. Neuro:  Nonfocal  Psych: Normal affect   Labs    High Sensitivity Troponin:  No results for input(s): "TROPONINIHS" in the last 720 hours.   Chemistry Recent Labs  Lab 09/11/22 1530 09/12/22 2101  09/13/22 0721 09/14/22 0420  NA 130* 131* 130* 128*  K 5.4* 4.5 4.8 4.5  CL 95* 100 99 98  CO2 18* '22 23 22  '$ GLUCOSE 112* 131* 109* 112*  BUN 49* 54* 54* 57*  CREATININE 1.30* 1.33* 1.62* 1.50*  CALCIUM 9.5 9.4 9.5 9.1  MG  --  2.0  --   --   PROT 6.7 6.8  --   --   ALBUMIN 3.6* 3.1*  --   --   AST 20 19  --   --   ALT 12 13  --   --   ALKPHOS 84 69  --   --   BILITOT 0.6 0.9  --   --   GFRNONAA  --  55* 43* 47*  ANIONGAP  --  '9 8 8    '$ Lipids No results for input(s): "CHOL", "TRIG", "HDL", "LABVLDL", "LDLCALC", "CHOLHDL" in the last 168 hours.  Hematology Recent Labs  Lab 09/11/22 1530 09/12/22 2101 09/14/22 0445  WBC 7.6 6.3 5.4  RBC 4.27 4.04* 3.75*  HGB 12.4* 11.8* 11.3*  HCT 36.6* 36.6* 33.0*  MCV 86 90.6 88.0  MCH 29.0 29.2 30.1  MCHC 33.9  32.2 34.2  RDW 13.8 16.1* 16.4*  PLT 282 258 211   Thyroid No results for input(s): "TSH", "FREET4" in the last 168 hours.  BNP Recent Labs  Lab 09/11/22 1530 09/12/22 2101  BNP  --  577.3*  PROBNP 8,444*  --     DDimer No results for input(s): "DDIMER" in the last 168 hours.   Radiology    VAS [place] ABI WITH/WO TBI  Result Date: 09/14/2022  LOWER EXTREMITY DOPPLER STUDY Patient Name:  Ryan Garner  Date of Exam:   09/14/2022 Medical Rec #: [id number]       Accession #:    [id number] Date of Birth: [birth year]        Patient Gender: M Patient Age:   77 years Exam Location:  [facility] Procedure:      VAS [place] ABI WITH/WO TBI Referring Phys: Loralie Champagne --------------------------------------------------------------------------------  Indications: Ulceration. High Risk Factors: Hypertension, hyperlipidemia, Diabetes, past history of                    smoking, coronary artery disease.  Comparison Study: No prior study. Performing Technologist: Candie Chroman  Examination Guidelines: A complete evaluation includes at minimum, Doppler waveform signals and systolic blood pressure reading at the level of bilateral brachial,  anterior tibial, and posterior tibial arteries, when vessel segments are accessible. Bilateral testing is considered an integral part of a complete examination. Photoelectric Plethysmograph (PPG) waveforms and toe systolic pressure readings are included as required and additional duplex testing as needed. Limited examinations for reoccurring indications may be performed as noted.  ABI Findings: +---------+------------------+-----+---------+---------------------------------+ Right    Rt Pressure (mmHg)IndexWaveform Comment                           +---------+------------------+-----+---------+---------------------------------+ Brachial                        triphasicUnable to obtain due to picc line                                          placement                         +---------+------------------+-----+---------+---------------------------------+ PTA      121               1.02 triphasic                                  +---------+------------------+-----+---------+---------------------------------+ DP       112               0.94 triphasic                                  +---------+------------------+-----+---------+---------------------------------+ Ryan Garner               0.94 Normal                                     +---------+------------------+-----+---------+---------------------------------+ +---------+------------------+-----+---------+-------+ Left     Lt Pressure (mmHg)IndexWaveform Comment +---------+------------------+-----+---------+-------+ Brachial 119  triphasic        +---------+------------------+-----+---------+-------+ PTA      126               1.06 triphasic        +---------+------------------+-----+---------+-------+ DP       119               1.00 triphasic        +---------+------------------+-----+---------+-------+ Great Toe114               0.96 Normal            +---------+------------------+-----+---------+-------+ +-------+-----------+-----------+------------+------------+ ABI/TBIToday's ABIToday's TBIPrevious ABIPrevious TBI +-------+-----------+-----------+------------+------------+ Right  1.02       0.94                                +-------+-----------+-----------+------------+------------+ Left   1.06       0.96                                +-------+-----------+-----------+------------+------------+  Summary: Right: Resting right ankle-brachial index is within normal range. The right toe-brachial index is normal. Left: Resting left ankle-brachial index is within normal range. The left toe-brachial index is normal. *See table(s) above for measurements and observations.  Electronically signed by Orlie Pollen on 09/14/2022 at 6:44:57 PM.    Final    ECHOCARDIOGRAM COMPLETE  Result Date: 09/14/2022    ECHOCARDIOGRAM REPORT   Patient Name:   Ryan Garner Date of Exam: 09/14/2022 Medical Rec #:  [id number]      Height:       68.0 in Accession #:    [id number]     Weight:       307.3 lb Date of Birth:  16-Sep-1945       BSA:          2.453 m Patient Age:    71 years       BP:           96/65 mmHg Patient Gender: M              HR:           67 bpm. 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 CHF, CAD, Pulmonary HTN and COPD,                  Arrythmias:Atrial Fibrillation and Bradycardia,                  Signs/Symptoms:Hypotension; Risk Factors:Hypertension,                  Diabetes, Dyslipidemia and Sleep Apnea.  Sonographer:     Ronny Flurry Referring Phys:  [employer] Diagnosing Phys: Gwyndolyn Kaufman MD  Sonographer Comments: Patient is obese. Image acquisition challenging due to respiratory  motion. IMPRESSIONS  1. Left ventricular ejection fraction, by estimation, is 50 to 55%. The left ventricle has low normal function. The left ventricle has no regional wall motion abnormalities. Left ventricular diastolic parameters are  indeterminate. There is the interventricular septum is flattened in systole and diastole, consistent with right ventricular pressure and volume overload.  2. Right ventricular systolic function is moderately reduced. The right ventricular size is moderately to severely. There is severely elevated pulmonary artery systolic pressure. The estimated right ventricular systolic pressure is 123456 mmHg.  3. Left atrial size was severely dilated.  4. Right atrial size was severely dilated.  5. The mitral valve is abnormal. There is severe mitral regurgitation likely functional with restricted posterior mitral leaflet. EROA 0.51cm2, RVol 35m.  6. Tricuspid valve regurgitation is moderate.  7. The aortic valve is tricuspid. There is severe calcifcation of the aortic valve. There is severe thickening of the aortic valve. Aortic valve regurgitation is not visualized. Moderate aortic valve stenosis. Aortic valve area, by VTI measures 1.20 cm. Aortic valve mean gradient measures 21.0 mmHg. Aortic valve Vmax measures 2.94 m/s.  8. Aortic dilatation noted. There is borderline dilatation of the ascending aorta, measuring 36 mm.  9. The inferior vena cava is dilated in size with <50% respiratory variability, suggesting right atrial pressure of 15 mmHg. Comparison(s): Compared to prior TTE 03/2022, there is no significant change. There continues to be severe MR, moderate AS, and severe pulmonary HTN. Prior TTE with mean AoV gradient 176mg (currently 2133m). Prior PASP 85 (currently 28m12m. RV remains  enlarged with moderate systolic dysfunction. FINDINGS  Left Ventricle: Left ventricular ejection fraction, by estimation, is 50 to 55%. The left ventricle has low normal function. The left ventricle  has no regional wall motion abnormalities. Definity contrast agent was given IV to delineate the left ventricular endocardial borders. The left ventricular internal cavity size was normal in size. There is no left ventricular hypertrophy. The interventricular septum is flattened in systole and diastole, consistent with right ventricular pressure and volume overload. Left ventricular diastolic parameters are indeterminate. Right Ventricle: The right ventricular size is moderately to severely. Right vetricular wall thickness was not well visualized. Right ventricular systolic function is moderately reduced. There is severely elevated pulmonary artery systolic pressure. The tricuspid regurgitant velocity is 3.68 m/s, and with an assumed right atrial pressure of 15 mmHg, the estimated right ventricular systolic pressure is 69.2123456g. Left Atrium: Left atrial size was severely dilated. Right Atrium: Right atrial size was severely dilated. Pericardium: There is no evidence of pericardial effusion. Mitral Valve: EROA 0.51cm2, RVol 64ml1me mitral valve is abnormal. There is severe mitral regurgitation likely functional with restricted posterior mitral leaflet mitral valve regurgitation. Tricuspid Valve: The tricuspid valve is normal in structure. Tricuspid valve regurgitation is moderate. Aortic Valve: The aortic valve is tricuspid. There is severe calcifcation of the aortic valve. There is severe thickening of the aortic valve. Aortic valve regurgitation is not visualized. Moderate aortic stenosis is present. Aortic valve mean gradient measures 21.0 mmHg. Aortic valve peak gradient measures 34.6 mmHg. Aortic valve area, by VTI measures 1.20 cm. Pulmonic Valve: The pulmonic valve was normal in structure. Pulmonic valve regurgitation is trivial. Aorta: Aortic dilatation noted. There is borderline dilatation of the ascending aorta, measuring 36 mm. Venous: The inferior vena cava is dilated in size with less than 50%  respiratory variability, suggesting right atrial pressure of 15 mmHg. IAS/Shunts: The atrial septum is grossly normal.  LEFT VENTRICLE PLAX 2D LVIDd:         5.20 cm   Diastology LVIDs:         4.40 cm   LV e' medial:    8.85 cm/s LV PW:  1.00 cm   LV E/e' medial:  13.6 LV IVS:        1.20 cm   LV e' lateral:   14.40 cm/s LVOT diam:     2.30 cm   LV E/e' lateral: 8.3 LV SV:         66 LV SV Index:   27 LVOT Area:     4.15 cm  RIGHT VENTRICLE            IVC RV S prime:     8.21 cm/s  IVC diam: 2.70 cm TAPSE (M-mode): 1.3 cm LEFT ATRIUM              Index        RIGHT ATRIUM           Index LA diam:        6.10 cm  2.49 cm/m   RA Area:     35.10 cm LA Vol (A2C):   108.0 ml 44.03 ml/m  RA Volume:   139.00 ml 56.66 ml/m LA Vol (A4C):   106.1 ml 43.25 ml/m LA Biplane Vol: 117.0 ml 47.69 ml/m  AORTIC VALVE AV Area (Vmax):    1.20 cm AV Area (Vmean):   1.17 cm AV Area (VTI):     1.20 cm AV Vmax:           294.00 cm/s AV Vmean:          196.000 cm/s AV VTI:            0.550 m AV Peak Grad:      34.6 mmHg AV Mean Grad:      21.0 mmHg LVOT Vmax:         84.65 cm/s LVOT Vmean:        55.100 cm/s LVOT VTI:          0.158 m LVOT/AV VTI ratio: 0.29  AORTA Ao Root diam: 3.50 cm Ao Asc diam:  3.60 cm MR Peak grad:    85.9 mmHg    TRICUSPID VALVE MR Mean grad:    53.0 mmHg    TR Peak grad:   54.2 mmHg MR Vmax:         463.50 cm/s  TR Vmax:        368.00 cm/s MR Vmean:        347.0 cm/s MR PISA:         15.42 cm    SHUNTS MR PISA Eff ROA: 102 mm      Systemic VTI:  0.16 m MR PISA Radius:  1.57 cm      Systemic Diam: 2.30 cm MV E velocity: 120.00 cm/s Gwyndolyn Kaufman MD Electronically signed by Gwyndolyn Kaufman MD Signature Date/Time: 09/14/2022/10:36:30 AM    Final    DG CHEST PORT 1 VIEW  Result Date: 09/13/2022 CLINICAL DATA:  Status post PICC line placement EXAM: PORTABLE CHEST 1 VIEW COMPARISON:  09/13/2022 at 11:01 a.m. FINDINGS: Stable cardiomegaly. New right PICC tip at the superior cavoatrial  junction. Similar layering bilateral pleural effusions and associated atelectasis/consolidation. No pneumothorax. No acute osseous abnormality. IMPRESSION: 1. New right PICC tip at the superior cavoatrial junction. 2. Similar layering bilateral pleural effusions and associated atelectasis/consolidation. Electronically Signed   By: Placido Sou M.D.   On: 09/13/2022 17:38   [place] EKG SITE RITE  Result Date: 09/13/2022 If [facility] image not attached, placement could not be confirmed due to current cardiac rhythm.  DG CHEST PORT 1 VIEW  Result Date: 09/13/2022  CLINICAL DATA:  Chest congestion EXAM: PORTABLE CHEST 1 VIEW COMPARISON:  Same day chest radiograph FINDINGS: Stable heart size. Low lung volumes. Pulmonary vascular congestion. Right greater than left pleural effusions with associated bibasilar airspace opacities, also worse on the right. No pneumothorax. IMPRESSION: Stable chest radiograph. Right greater than left pleural effusions with associated bibasilar airspace opacities, also worse on the right. Electronically Signed   By: Davina Poke D.O.   On: 09/13/2022 11:27   DG Chest Port 1 View  Result Date: 09/13/2022 CLINICAL DATA:  Shortness of breath, cough, and congestion EXAM: PORTABLE CHEST 1 VIEW COMPARISON:  Chest radiograph dated 07/30/2022 FINDINGS: Low lung volumes. Patchy right basilar opacities. Small bilateral pleural effusions. No pneumothorax. Enlarged cardiomediastinal silhouette is likely projectional. The visualized skeletal structures are unremarkable. IMPRESSION: 1. Patchy right basilar opacities, which may represent atelectasis, aspiration, or pneumonia. 2. Small bilateral pleural effusions. Electronically Signed   By: Darrin Nipper M.D.   On: 09/13/2022 08:23    Cardiac Studies   TTE 09/14/22: IMPRESSIONS     1. Left ventricular ejection fraction, by estimation, is 50 to 55%. The  left ventricle has low normal function. The left ventricle has no regional  wall motion  abnormalities. Left ventricular diastolic parameters are  indeterminate. There is the  interventricular septum is flattened in systole and diastole, consistent  with right ventricular pressure and volume overload.   2. Right ventricular systolic function is moderately reduced. The right  ventricular size is moderately to severely. There is severely elevated  pulmonary artery systolic pressure. The estimated right ventricular  systolic pressure is 123456 mmHg.   3. Left atrial size was severely dilated.   4. Right atrial size was severely dilated.   5. The mitral valve is abnormal. There is severe mitral regurgitation  likely functional with restricted posterior mitral leaflet. EROA 0.51cm2,  RVol 1m.   6. Tricuspid valve regurgitation is moderate.   7. The aortic valve is tricuspid. There is severe calcifcation of the  aortic valve. There is severe thickening of the aortic valve. Aortic valve  regurgitation is not visualized. Moderate aortic valve stenosis. Aortic  valve area, by VTI measures 1.20  cm. Aortic valve mean gradient measures 21.0 mmHg. Aortic valve Vmax  measures 2.94 m/s.   8. Aortic dilatation noted. There is borderline dilatation of the  ascending aorta, measuring 36 mm.   9. The inferior vena cava is dilated in size with <50% respiratory  variability, suggesting right atrial pressure of 15 mmHg.   Comparison(s): Compared to prior TTE 03/2022, there is no significant  change. There continues to be severe MR, moderate AS, and severe pulmonary  HTN. Prior TTE with mean AoV gradient 118mg (currently 21100m). Prior  PASP 85 (currently 100m18m. RV remains   enlarged with moderate systolic dysfunction.   Patient Profile     76 y31. male ***  Assessment & Plan    ***  For questions or updates, please contact [facility] consult [email or web address] for contact info under        Signed, HeatFreada Bergeron  09/14/2022, 8:52 PM

## 2022-09-14 NOTE — Progress Notes (Signed)
ANTICOAGULATION CONSULT NOTE  Pharmacy Consult for Heparin Indication: atrial fibrillation  Allergies  Allergen Reactions   Flecainide     Per patient dizziness, shortness of breath, blurred vision   Metoprolol Tartrate Itching   Rofecoxib Rash and Other (See Comments)    (Vioxx)    Patient Measurements: Height: '5\' 8"'$  (172.7 cm) Weight: (!) 139.4 kg (307 lb 5.1 oz) IBW/kg (Calculated) : 68.4 Heparin Dosing Weight: 100 kg  Vital Signs: Temp: 97.4 F (36.3 C) (02/23 1139) Temp Source: Oral (02/23 1139) BP: 102/71 (02/23 1139) Pulse Rate: 81 (02/23 1139)  Labs: Recent Labs    09/12/22 2101 09/13/22 0721 09/13/22 0721 09/13/22 1853 09/14/22 0420 09/14/22 0445 09/14/22 1253  HGB 11.8*  --   --   --   --  11.3*  --   HCT 36.6*  --   --   --   --  33.0*  --   PLT 258  --   --   --   --  211  --   APTT  --  >200*   < > 139*  --  >200* 39*  HEPARINUNFRC  --  >1.10*  --  >1.10*  --  >1.10*  --   CREATININE 1.33* 1.62*  --   --  1.50*  --   --    < > = values in this interval not displayed.     Estimated Creatinine Clearance: 55.6 mL/min (A) (by C-G formula based on SCr of 1.5 mg/dL (H)).   Medical History: Past Medical History:  Diagnosis Date   A-fib [facility])    Acute on chronic diastolic CHF (congestive heart failure) ([place]) 07/21/2020   Coronary artery disease    Diabetes mellitus without complication (HCC)    Dyspnea    Dysrhythmia    A-fib   Hyperlipidemia    Hypertension    Metabolic syndrome    Prediabetes    Sleep apnea    STEMI (ST elevation myocardial infarction) ([place])     Medications:  Medications Prior to Admission  Medication Sig Dispense Refill Last Dose   albuterol (VENTOLIN HFA) 108 (90 Base) MCG/ACT inhaler Inhale 2 puffs into the lungs every 6 (six) hours as needed for wheezing or shortness of breath. 8 g 0 Past Month   apixaban (ELIQUIS) 5 MG TABS tablet Take 1 tablet (5 mg total) by mouth 2 (two) times daily. 180 tablet 1 09/12/2022 at 0900    ascorbic acid (VITAMIN C) 1000 MG tablet Take 1 tablet (1,000 mg total) by mouth daily. 60 tablet 1 09/12/2022   atorvastatin (LIPITOR) 80 MG tablet Take 1 tablet by mouth once daily 90 tablet 3 09/12/2022   clopidogrel (PLAVIX) 75 MG tablet Take 1 tablet (75 mg total) by mouth daily with breakfast. 90 tablet 2 09/12/2022   cyanocobalamin (VITAMIN B12) 1000 MCG tablet Take 1,000 mcg by mouth daily.   09/12/2022   furosemide (LASIX) 80 MG tablet Take 1.5 tablets (120 mg total) by mouth 2 (two) times daily.   09/12/2022   metoprolol tartrate (LOPRESSOR) 25 MG tablet Take 1 tablet (25 mg total) by mouth 2 (two) times daily. 60 tablet 0 09/12/2022 at 0800   Multiple Vitamin (MULTIVITAMIN WITH MINERALS) TABS tablet Take 1 tablet by mouth daily.   09/12/2022   nitroGLYCERIN (NITROSTAT) 0.4 MG SL tablet Place 1 tablet (0.4 mg total) under the tongue every 5 (five) minutes x 3 doses as needed for chest pain. 25 tablet 2 Past Month   nutrition supplement,  JUVEN, (JUVEN) PACK Take 1 packet by mouth 2 (two) times daily between meals. 60 packet 1 09/12/2022   nystatin (MYCOSTATIN/NYSTOP) powder Apply 1 Application topically 3 (three) times daily. 15 g 0 09/12/2022   potassium chloride SA (KLOR-CON M) 20 MEQ tablet Take 2 tablets (40 mEq total) by mouth daily.   09/12/2022   Tiotropium Bromide Monohydrate (SPIRIVA RESPIMAT) 1.25 MCG/ACT AERS Inhale 2 puffs into the lungs daily. 4 g 3 09/12/2022   zinc sulfate 220 (50 Zn) MG capsule Take 1 capsule (220 mg total) by mouth daily. 60 capsule 0 09/12/2022   fluconazole (DIFLUCAN) 150 MG tablet Take 1 tablet (150 mg total) by mouth once a week. (Patient not taking: Reported on 09/12/2022) 2 tablet 0 Completed Course   sulfamethoxazole-trimethoprim (BACTRIM DS) 800-160 MG tablet Take 1 tablet by mouth 2 (two) times daily. (Patient not taking: Reported on 09/12/2022) 20 tablet 0 Completed Course    Assessment: 77 y.o. M presents for TEE and probable R/L heart cath on 2/23. Pt on  apixaban PTA for afib - last dose 2/21 0900. To hold apixaban and start heparin bridge. Apixaban will be affecting heparin level so will utilize aPTT for monitoring until levels correlate.  Morning level resulted with aPTT > 200 and HL > 1.10. Spoke with nursing confirmed that level was drawn from separate arm from heparin. No issues with bleeding.   Repeating aPTT off heparin, now down to 39 seconds.  Asked to resume IV heparin now.  Goal of Therapy:  Heparin level 0.3-0.7 units/ml aPTT 66-102 seconds Monitor platelets by anticoagulation protocol: Yes   Plan:  Resume IV heparin at 900 units/hr. Check aPTT in 8 hrs. Daily aPTT/heparin level and CBC.  [place] Crane, Roylene Reason, BCCP Clinical Pharmacist  09/14/2022 3:38 PM   [facility] pharmacy phone numbers are listed on [email or web address]

## 2022-09-14 NOTE — Progress Notes (Signed)
OT Cancellation Note  Patient Details Name: Ryan Garner MRN: [id number] DOB: March 07, 1946   Cancelled Treatment:    Reason Eval/Treat Not Completed: Active bedrest order with bathroom privileges.  OT to continue efforts with Cardiac Cath planned.  [name] D [name] 09/14/2022, 1:27 PM 09/14/2022  RP, OTR/L  Acute Rehabilitation Services  Office:  [phone]

## 2022-09-14 NOTE — Progress Notes (Addendum)
Advanced Heart Failure Rounding Note  PCP-Cardiologist: Dorris Carnes, MD   Subjective:    PICC placed yesterday. Bleeding noticed this morning along with some ecchymosis. Hep gtt stopped per pharmacy. IV team coming to access PICC site.   DBA and lasix gtt currently held with questionable access. DBA to be infused through PIV for now.   Pt with echo being done. No complaints this am.   Objective:   Weight Range: (!) 139.4 kg Body mass index is 46.73 kg/m.   Vital Signs:   Temp:  [97.2 F (36.2 C)-98.8 F (37.1 C)] 97.3 F (36.3 C) (02/23 0743) Pulse Rate:  [65-79] 77 (02/23 0743) Resp:  [16-18] 16 (02/23 0743) BP: (76-101)/(53-71) 100/59 (02/23 0743) SpO2:  [97 %-100 %] 97 % (02/23 0832) Weight:  [139.4 kg] 139.4 kg (02/23 0559) Last BM Date : 09/13/22  Weight change: Filed Weights   09/13/22 0025 09/13/22 0500 09/14/22 0559  Weight: (!) 139.2 kg (!) 139.2 kg (!) 139.4 kg    Intake/Output:   Intake/Output Summary (Last 24 hours) at 09/14/2022 0844 Last data filed at 09/14/2022 [id number] Gross per 24 hour  Intake 990.06 ml  Output 1050 ml  Net -59.94 ml    Physical Exam   CVP 15 General:  chronically ill appearing.  No respiratory difficulty HEENT: normal Neck: supple. JVD ~15. Carotids 2+ bilat; no bruits. No lymphadenopathy or thyromegaly appreciated. Cor: PMI nondisplaced. Regular rate & rhythm. No rubs, gallops or murmurs. Lungs: exp wheezes Abdomen: soft, nontender, nondistended. No hepatosplenomegaly. No bruits or masses. Good bowel sounds. Extremities: no cyanosis, clubbing, rash, +3 BLE edema. Wound dressing on bilaterally. PICC RUE Neuro: alert & oriented x 3, cranial nerves grossly intact. moves all 4 extremities w/o difficulty. Affect pleasant.   Telemetry   NSR 60s (Personally reviewed)    EKG    No new EKG to review  Labs    CBC Recent Labs    09/12/22 2101 09/14/22 0445  WBC 6.3 5.4  NEUTROABS 4.4  --   HGB 11.8* 11.3*  HCT 36.6*  33.0*  MCV 90.6 88.0  PLT 258 [id number]   Basic Metabolic Panel Recent Labs    09/12/22 2101 09/13/22 0721 09/14/22 0420  NA 131* 130* 128*  K 4.5 4.8 4.5  CL 100 99 98  CO2 '22 23 22  '$ GLUCOSE 131* 109* 112*  BUN 54* 54* 57*  CREATININE 1.33* 1.62* 1.50*  CALCIUM 9.4 9.5 9.1  MG 2.0  --   --    Liver Function Tests Recent Labs    09/11/22 1530 09/12/22 2101  AST 20 19  ALT 12 13  ALKPHOS 84 69  BILITOT 0.6 0.9  PROT 6.7 6.8  ALBUMIN 3.6* 3.1*   No results for input(s): "LIPASE", "AMYLASE" in the last 72 hours. Cardiac Enzymes No results for input(s): "CKTOTAL", "CKMB", "CKMBINDEX", "TROPONINI" in the last 72 hours.  BNP: BNP (last 3 results) Recent Labs    11/20/21 1409 04/06/22 1253 09/12/22 2101  BNP 485.0* 682.5* 577.3*    ProBNP (last 3 results) Recent Labs    08/15/22 1049 09/04/22 1404 09/11/22 1530  PROBNP 5,315* 11,525* 8,444*    Other results:   Imaging   DG CHEST PORT 1 VIEW  Result Date: 09/13/2022 CLINICAL DATA:  Status post PICC line placement EXAM: PORTABLE CHEST 1 VIEW COMPARISON:  09/13/2022 at 11:01 a.m. FINDINGS: Stable cardiomegaly. New right PICC tip at the superior cavoatrial junction. Similar layering bilateral pleural effusions and associated atelectasis/consolidation. No  pneumothorax. No acute osseous abnormality. IMPRESSION: 1. New right PICC tip at the superior cavoatrial junction. 2. Similar layering bilateral pleural effusions and associated atelectasis/consolidation. Electronically Signed   By: Placido Sou M.D.   On: 09/13/2022 17:38   [place] EKG SITE RITE  Result Date: 09/13/2022 If [facility] image not attached, placement could not be confirmed due to current cardiac rhythm.  DG CHEST PORT 1 VIEW  Result Date: 09/13/2022 CLINICAL DATA:  Chest congestion EXAM: PORTABLE CHEST 1 VIEW COMPARISON:  Same day chest radiograph FINDINGS: Stable heart size. Low lung volumes. Pulmonary vascular congestion. Right greater than left  pleural effusions with associated bibasilar airspace opacities, also worse on the right. No pneumothorax. IMPRESSION: Stable chest radiograph. Right greater than left pleural effusions with associated bibasilar airspace opacities, also worse on the right. Electronically Signed   By: Davina Poke D.O.   On: 09/13/2022 11:27    Medications:     Scheduled Medications:  atorvastatin  80 mg Oral Daily   Chlorhexidine Gluconate Cloth  6 each Topical Daily   clopidogrel  75 mg Oral Q breakfast   potassium chloride  40 mEq Oral BID   sodium chloride flush  3 mL Intravenous Q12H   umeclidinium bromide  1 puff Inhalation Daily    Infusions:  sodium chloride     DOBUTamine 2.5 mcg/kg/min (09/14/22 0836)   furosemide (LASIX) 200 mg in dextrose 5 % 100 mL (2 mg/mL) infusion Stopped (09/14/22 0837)    PRN Medications: sodium chloride, acetaminophen, albuterol, ondansetron (ZOFRAN) IV, sodium chloride flush  Patient Profile   Mr Hamacher is 77 year old with a history of CAD, STEMI 2021, DES to LCx, permanent A fib, severe MR, HTN, HLD, sleep apnea, MR, chronic lymphedema, and HFpEF.    Admitted with A/C HFpEF.   Assessment/Plan  1. A/C HFpEF -2023 Echo preserved EF 55-60% Severe MR - Massive volume overload. Hypotensive. On exam cool + wet.  Poor response to 120 mg IV lasix. Suspect low output. Will need to add Dobutamine.  - Co-ox 79% - Now off BB.  - PICC placed yesterday, having bleeding issues with it this morning. IV team coming to access.  - Remains volume overloaded. Lasix gtt currently on hold. Plan to restart once PICC accessed.  - DBA 2.5 to be infused per PIV for now - Follow closely.    2. MR , Severe -Echo 09/2021 - MR Peak grad 86, mean grad 64 -Once diuresed will need TEE.    3. AKI on CKD Stage IIIa - Creatinine on admit 1.3-->today 1.5 - Follow renal function closely .    3. Permanent A fib  - Stop bb. Of note says he has not been taking Metoprolol because it makes  him itch. I will update allergy list.  - Rate controlled.  - Hep gtt stopped this morning with ongoing bleeding from PICC site.    4. CAD -No chest pain.  -DES LCx 2021  -On plavix 75 mg daily  -Continue atorvastatin    5. Pleural Effusion R>L  - Continue to diurese.    6. Lower extremity wounds - Followed by Dr Sharol Given. Will need WOC.    7. GOC - Discussed code status. He does not want intubation but ok with Bipap and CPR.   Length of Stay: [place], NP  09/14/2022, 8:44 AM  Advanced Heart Failure Team Pager [phone] (M-F; 7a - 5p)  Please contact [facility] for night-coverage after hours (5p -7a )  and weekends on [email or web address]  Patient seen with NP, agree with the above note.   Dobutamine 2.5 started yesterday, co-ox 79% today.  Creatinine stable at 1.5.  He did not diurese vigorously with this. CVP 24.    Bleeding around PICC earlier today, heparin gtt stopped.  Now resolved.   General: NAD Neck: JVP 16 cm, no thyromegaly or thyroid nodule.  Lungs: Clear to auscultation bilaterally with normal respiratory effort. CV: Nondisplaced PMI.  Heart regular S1/S2, no S3/S4, 3/6 HSM apex.  2+ edema to thighs.   Abdomen: Soft, nontender, no hepatosplenomegaly, no distention.  Skin: Intact without lesions or rashes.  Neurologic: Alert and oriented x 3.  Psych: Normal affect. Extremities: No clubbing or cyanosis.  HEENT: Normal.   1. Acute on chronic HFpEF: With prominent RV dysfunction.  Echo this admission shows EF 50-55%, D-shaped septum, moderate-severe RV enlargement with moderate RV dysfunction, PASP 69, severe biatrial enlargement, moderate AS with mean gradient 21 mmHg/AVA 1.2 cm^2, IVC dilated. Patient is markedly volume overloaded on exam.  Creatinine stable at 1.5.  SBP 100s.  Still with poor diuresis on Lasix gtt.  - Continue dobutamine 2.5 mcg/kg/min for RV support.  - Increase Lasix gtt to 20 mg/hr.  I will give metolazone 5 mg po x 1 now.   - LHC/RHC planned  when better diuresed and creatinine stabilized.  2. Mitral regurgitation: Severe MR by echo.  Mechanism uncertain, may be atrial functional MR with severe LAE. Suspect this is driving pulmonary hypertension and RV failure.  - TEE when he is adequately diuresed to assess mitral valve.  - May be candidate for Mitraclip but will need need significant optimization.  3. AKI on CKD 3: Suspect cardiorenal syndrome.   - Continue dobutamine as above.  4. Atrial fibrillation: Permanent, has been seen by EP in the past.  - PICC re-dressed, bleeding stopped.  Restart heparin gtt.  5. CAD: No chest pain.  Had DES to mLCx in 10/21.   - Consider coronary angiography ?pre-Mitraclip if creatinine comes down.  - Continue atorvastatin.  - He has been on Plavix.  He is almost 3 years post-PCI, so not sure if he really needs to continue this given anticoagulation use => will stop with bleeding.  6. LE wounds: Followed as outpatient by Dr. Sharol Given.   - Wound care - peripheral arterial dopplers ordered.   Loralie Champagne 09/14/2022 12:19 PM

## 2022-09-14 NOTE — Progress Notes (Signed)
RN rounded to resume heparin gtt that was held prior due to elevated PTT levels and to perform shift assessment, patient's double R PICC dressing saturated in blood and does not to appear actively bleeding. There is bruising around the R PICC site. Heparin gtt was infusing L PIV. IV team ordered to assess PICC. RN continue to hold heparin gtt. RN informed 3E pharmacist, cardiology, and CHF team of PICC concerns and infusions. RN infusing dobutamine gtt into the PIV. Lasix gtt is held due to lack of IV access.

## 2022-09-14 NOTE — Consult Note (Addendum)
WOC consult:  Received secure chat  from HF NP regarding placement of unna boots.  This patient has had 3 layer compression wraps and lymphedema compression pumps at home.  Unna boots ordered at her request, did change wound care orders to accommodate unna boots.  Calcium Alginate to be applied to wounds on left leg prior to unna boot application twice a week. Ortho tech notified of new order.   Thank you,    Birdella Sippel MSN, RN-BC, [employer]

## 2022-09-15 DIAGNOSIS — I5033 Acute on chronic diastolic (congestive) heart failure: Secondary | ICD-10-CM | POA: Diagnosis not present

## 2022-09-15 LAB — CBC
HCT: 33.1 % — ABNORMAL LOW (ref 39.0–52.0)
Hemoglobin: 11.3 g/dL — ABNORMAL LOW (ref 13.0–17.0)
MCH: 30.1 pg (ref 26.0–34.0)
MCHC: 34.1 g/dL (ref 30.0–36.0)
MCV: 88 fL (ref 80.0–100.0)
Platelets: 213 10*3/uL (ref 150–400)
RBC: 3.76 MIL/uL — ABNORMAL LOW (ref 4.22–5.81)
RDW: 16.4 % — ABNORMAL HIGH (ref 11.5–15.5)
WBC: 6.4 10*3/uL (ref 4.0–10.5)
nRBC: 0 % (ref 0.0–0.2)

## 2022-09-15 LAB — BASIC METABOLIC PANEL
Anion gap: 10 (ref 5–15)
BUN: 56 mg/dL — ABNORMAL HIGH (ref 8–23)
CO2: 23 mmol/L (ref 22–32)
Calcium: 9.1 mg/dL (ref 8.9–10.3)
Chloride: 97 mmol/L — ABNORMAL LOW (ref 98–111)
Creatinine, Ser: 1.48 mg/dL — ABNORMAL HIGH (ref 0.61–1.24)
GFR, Estimated: 48 mL/min — ABNORMAL LOW (ref >60)
Glucose, Bld: 124 mg/dL — ABNORMAL HIGH (ref 70–99)
Potassium: 4.1 mmol/L (ref 3.5–5.1)
Sodium: 130 mmol/L — ABNORMAL LOW (ref 135–145)

## 2022-09-15 LAB — APTT
aPTT: 47 seconds — ABNORMAL HIGH (ref 24–36)
aPTT: 49 seconds — ABNORMAL HIGH (ref 24–36)
aPTT: 68 seconds — ABNORMAL HIGH (ref 24–36)

## 2022-09-15 LAB — COOXEMETRY PANEL
Carboxyhemoglobin: 1.9 % — ABNORMAL HIGH (ref 0.5–1.5)
Methemoglobin: <0.7 % (ref 0.0–1.5)
O2 Saturation: 73 %
Total hemoglobin: 11.6 g/dL — ABNORMAL LOW (ref 12.0–16.0)

## 2022-09-15 LAB — HEPARIN LEVEL (UNFRACTIONATED): Heparin Unfractionated: 0.81 IU/mL — ABNORMAL HIGH (ref 0.30–0.70)

## 2022-09-15 MED ORDER — METOLAZONE 5 MG PO TABS
5.0000 mg | ORAL_TABLET | Freq: Two times a day (BID) | ORAL | Status: AC
  Administered 2022-09-15 (×2): 5 mg via ORAL
  Filled 2022-09-15 (×2): qty 1

## 2022-09-15 NOTE — Evaluation (Addendum)
Physical Therapy Evaluation Patient Details Name: Ryan Garner MRN: [id number] DOB: Jan 21, 1946 Today's Date: 09/15/2022  History of Present Illness  Pt is a 77 y.o. M who presents 09/12/2022 with A/C HFpEF. Significant PMH: CAD, STEMI 2021, DES to LCx, permanent A fib, severe MR, HTN, HLD, sleep apnea, DM, rotator cuff repair, MR, chronic lymphedema, and HFpEF.  Clinical Impression  PTA, pt lives alone, uses an upright Rollator for ambulation and is independent with ADL's. Pt reports friend/family assist at d/c. Pt requiring min-mod assist for bed mobility/transfers and ambulating 100 ft with a RW. Reports L hand tingling/discomfort during walk gripping walker which resolved upon return to room. HR 99-115 bpm, on 3L O2. Pt demonstrates generalized weakness, decreased activity tolerance, impaired balance and cardiopulmonary endurance. Recommend HHPT at d/c.     Recommendations for follow up therapy are one component of a multi-disciplinary discharge planning process, led by the attending physician.  Recommendations may be updated based on patient status, additional functional criteria and insurance authorization.  Follow Up Recommendations Home health PT      Assistance Recommended at Discharge PRN  Patient can return home with the following  A little help with bathing/dressing/bathroom;Assistance with cooking/housework;Assist for transportation;Help with stairs or ramp for entrance    Equipment Recommendations None recommended by PT  Recommendations for Other Services       Functional Status Assessment Patient has had a recent decline in their functional status and demonstrates the ability to make significant improvements in function in a reasonable and predictable amount of time.     Precautions / Restrictions Precautions Precautions: Fall Restrictions Weight Bearing Restrictions: No      Mobility  Bed Mobility Overal bed mobility: Needs Assistance Bed Mobility: Supine to Sit      Supine to sit: Mod assist     General bed mobility comments: Assist for BLE's out of bed, cues for use of bed rail    Transfers Overall transfer level: Needs assistance Equipment used: Rolling walker (2 wheels) Transfers: Sit to/from Stand Sit to Stand: Min assist           General transfer comment: Light minA to power up from elevated bed height    Ambulation/Gait Ambulation/Gait assistance: Min guard, +2 safety/equipment Gait Distance (Feet): 100 Feet Assistive device: Rolling walker (2 wheels) Gait Pattern/deviations: Step-through pattern, Decreased stride length Gait velocity: decreased Gait velocity interpretation: <1.8 ft/sec, indicate of risk for recurrent falls   General Gait Details: Pt requiring one standing rest break, min guard for safety, +2 equipment. Cues provided for activity pacing  Stairs            Wheelchair Mobility    Modified Rankin (Stroke Patients Only)       Balance Overall balance assessment: Needs assistance Sitting-balance support: Feet supported Sitting balance-Leahy Scale: Fair     Standing balance support: Bilateral upper extremity supported, Reliant on assistive device for balance Standing balance-Leahy Scale: Poor                               Pertinent Vitals/Pain Pain Assessment Pain Assessment: No/denies pain    Home Living Family/patient expects to be discharged to:: Private residence Living Arrangements: Alone Available Help at Discharge: Friend(s);Available PRN/intermittently;Family Type of Home: House Home Access: Level entry;Ramped entrance       Home Layout: Two level;Other (Comment) Home Equipment: Cane - single point;Grab bars - toilet;Grab bars - tub/shower;Wheelchair - manual (stand up walker -  Rollator, hoyer lift)      Prior Function Prior Level of Function : Independent/Modified Independent;Driving             Mobility Comments: uses stand up walker ADLs Comments: Pt has  housekeeper 1x/wk     Hand Dominance   Dominant Hand: Right    Extremity/Trunk Assessment   Upper Extremity Assessment Upper Extremity Assessment: Defer to OT evaluation    Lower Extremity Assessment Lower Extremity Assessment: Generalized weakness    Cervical / Trunk Assessment Cervical / Trunk Assessment: Kyphotic  Communication   Communication: No difficulties  Cognition Arousal/Alertness: Awake/alert Behavior During Therapy: Flat affect Overall Cognitive Status: No family/caregiver present to determine baseline cognitive functioning                                 General Comments: Pt with flat affect, difficulty describing symptoms and PLOF        General Comments      Exercises     Assessment/Plan    PT Assessment Patient needs continued PT services  PT Problem List Decreased strength;Decreased activity tolerance;Decreased balance;Decreased mobility;Cardiopulmonary status limiting activity;Obesity       PT Treatment Interventions DME instruction;Gait training;Functional mobility training;Therapeutic exercise;Therapeutic activities;Balance training;Patient/family education    PT Goals (Current goals can be found in the Care Plan section)  Acute Rehab PT Goals Patient Stated Goal: to get stronger PT Goal Formulation: With patient Time For Goal Achievement: 09/29/22 Potential to Achieve Goals: Good    Frequency Min 3X/week     Co-evaluation               AM-PAC PT "6 Clicks" Mobility  Outcome Measure Help needed turning from your back to your side while in a flat bed without using bedrails?: A Little Help needed moving from lying on your back to sitting on the side of a flat bed without using bedrails?: A Lot Help needed moving to and from a bed to a chair (including a wheelchair)?: A Little Help needed standing up from a chair using your arms (e.g., wheelchair or bedside chair)?: A Little Help needed to walk in hospital room?: A  Little Help needed climbing 3-5 steps with a railing? : A Lot 6 Click Score: 16    End of Session Equipment Utilized During Treatment: Oxygen Activity Tolerance: Patient tolerated treatment well Patient left: in chair;with call bell/phone within reach;with chair alarm set Nurse Communication: Mobility status PT Visit Diagnosis: Muscle weakness (generalized) (M62.81);Difficulty in walking, not elsewhere classified (R26.2)    Time: [id number] PT Time Calculation (min) (ACUTE ONLY): 24 min   Charges:   PT Evaluation $PT Eval Moderate Complexity: 1 Mod PT Treatments $Therapeutic Activity: 8-22 mins        Wyona Almas, PT, DPT Acute Rehabilitation Services Office [phone]   Deno Etienne 09/15/2022, 2:38 PM

## 2022-09-15 NOTE — Progress Notes (Signed)
Patient ID: Ryan Garner, male   DOB: 10/03/1945, 77 y.o.   MRN: [id number]     Advanced Heart Failure Rounding Note  PCP-Cardiologist: Dorris Carnes, MD   Subjective:    Patient diuresed yesterday with stable creatinine 1.48, I/Os net negative 2642.  Weight down.  He is on Lasix gtt 20 mg/hr, got metolazone 5 x 1 yesterday.  CVP remains > 20.   Dobutamine 2.5 for RV support with co-ox 73%.  Weak, takes assistance to get up to bedside commode.   Objective:   Weight Range: (!) 136.4 kg Body mass index is 45.72 kg/m.   Vital Signs:   Temp:  [97.4 F (36.3 C)-97.9 F (36.6 C)] 97.4 F (36.3 C) (02/24 0847) Pulse Rate:  [63-89] 63 (02/24 0847) Resp:  [16-24] 24 (02/24 0847) BP: (102-118)/(63-78) 118/78 (02/24 0847) SpO2:  [93 %-100 %] 100 % (02/24 0847) Weight:  [136.4 kg] 136.4 kg (02/24 0331) Last BM Date : 09/14/22  Weight change: Filed Weights   09/13/22 0500 09/14/22 0559 09/15/22 0331  Weight: (!) 139.2 kg (!) 139.4 kg (!) 136.4 kg    Intake/Output:   Intake/Output Summary (Last 24 hours) at 09/15/2022 1000 Last data filed at 09/15/2022 0850 Gross per 24 hour  Intake 589 ml  Output 4451 ml  Net -3862 ml    Physical Exam   CVP >20 General: NAD Neck: JVP 16+, no thyromegaly or thyroid nodule.  Lungs: Clear to auscultation bilaterally with normal respiratory effort. CV: Nondisplaced PMI.  Heart regular S1/S2, no S3/S4, 2/6 HSM apex.  2+ edema to thighs.  Abdomen: Soft, nontender, no hepatosplenomegaly, mild distention.  Skin: Intact without lesions or rashes.  Neurologic: Alert and oriented x 3.  Psych: Normal affect. Extremities: No clubbing or cyanosis.  HEENT: Normal.    Telemetry   Atrial fibrillation 60s (Personally reviewed)    EKG    No new EKG to review  Labs    CBC Recent Labs    09/12/22 2101 09/14/22 0445 09/15/22 0409  WBC 6.3 5.4 6.4  NEUTROABS 4.4  --   --   HGB 11.8* 11.3* 11.3*  HCT 36.6* 33.0* 33.1*  MCV 90.6 88.0 88.0  PLT  258 211 [id number]   Basic Metabolic Panel Recent Labs    09/12/22 2101 09/13/22 0721 09/14/22 0420 09/15/22 0409  NA 131*   < > 128* 130*  K 4.5   < > 4.5 4.1  CL 100   < > 98 97*  CO2 22   < > 22 23  GLUCOSE 131*   < > 112* 124*  BUN 54*   < > 57* 56*  CREATININE 1.33*   < > 1.50* 1.48*  CALCIUM 9.4   < > 9.1 9.1  MG 2.0  --   --   --    < > = values in this interval not displayed.   Liver Function Tests Recent Labs    09/12/22 2101  AST 19  ALT 13  ALKPHOS 69  BILITOT 0.9  PROT 6.8  ALBUMIN 3.1*   No results for input(s): "LIPASE", "AMYLASE" in the last 72 hours. Cardiac Enzymes No results for input(s): "CKTOTAL", "CKMB", "CKMBINDEX", "TROPONINI" in the last 72 hours.  BNP: BNP (last 3 results) Recent Labs    11/20/21 1409 04/06/22 1253 09/12/22 2101  BNP 485.0* 682.5* 577.3*    ProBNP (last 3 results) Recent Labs    08/15/22 1049 09/04/22 1404 09/11/22 1530  PROBNP 5,315* 11,525* 8,444*  Other results:   Imaging   VAS [place] ABI WITH/WO TBI  Result Date: 09/14/2022  LOWER EXTREMITY DOPPLER STUDY Patient Name:  Ryan Garner  Date of Exam:   09/14/2022 Medical Rec #: [id number]       Accession #:    [id number] Date of Birth: 1946/01/12        Patient Gender: M Patient Age:   77 years Exam Location:  [facility] Procedure:      VAS [place] ABI WITH/WO TBI Referring Phys: Loralie Champagne --------------------------------------------------------------------------------  Indications: Ulceration. High Risk Factors: Hypertension, hyperlipidemia, Diabetes, past history of                    smoking, coronary artery disease.  Comparison Study: No prior study. Performing Technologist: Candie Chroman  Examination Guidelines: A complete evaluation includes at minimum, Doppler waveform signals and systolic blood pressure reading at the level of bilateral brachial, anterior tibial, and posterior tibial arteries, when vessel segments are accessible. Bilateral testing is considered  an integral part of a complete examination. Photoelectric Plethysmograph (PPG) waveforms and toe systolic pressure readings are included as required and additional duplex testing as needed. Limited examinations for reoccurring indications may be performed as noted.  ABI Findings: +---------+------------------+-----+---------+---------------------------------+ Right    Rt Pressure (mmHg)IndexWaveform Comment                           +---------+------------------+-----+---------+---------------------------------+ Brachial                        triphasicUnable to obtain due to picc line                                          placement                         +---------+------------------+-----+---------+---------------------------------+ PTA      121               1.02 triphasic                                  +---------+------------------+-----+---------+---------------------------------+ DP       112               0.94 triphasic                                  +---------+------------------+-----+---------+---------------------------------+ [name]               0.94 Normal                                     +---------+------------------+-----+---------+---------------------------------+ +---------+------------------+-----+---------+-------+ Left     Lt Pressure (mmHg)IndexWaveform Comment +---------+------------------+-----+---------+-------+ Brachial 119                    triphasic        +---------+------------------+-----+---------+-------+ PTA      126               1.06 triphasic        +---------+------------------+-----+---------+-------+ DP       119  1.00 triphasic        +---------+------------------+-----+---------+-------+ Great Toe114               0.96 Normal           +---------+------------------+-----+---------+-------+ +-------+-----------+-----------+------------+------------+ ABI/TBIToday's ABIToday's  TBIPrevious ABIPrevious TBI +-------+-----------+-----------+------------+------------+ Right  1.02       0.94                                +-------+-----------+-----------+------------+------------+ Left   1.06       0.96                                +-------+-----------+-----------+------------+------------+  Summary: Right: Resting right ankle-brachial index is within normal range. The right toe-brachial index is normal. Left: Resting left ankle-brachial index is within normal range. The left toe-brachial index is normal. *See table(s) above for measurements and observations.  Electronically signed by Orlie Pollen on 09/14/2022 at 6:44:57 PM.    Final     Medications:     Scheduled Medications:  atorvastatin  80 mg Oral Daily   Chlorhexidine Gluconate Cloth  6 each Topical Daily   metolazone  5 mg Oral BID   potassium chloride  40 mEq Oral BID   sodium chloride flush  3 mL Intravenous Q12H   umeclidinium bromide  1 puff Inhalation Daily    Infusions:  sodium chloride     DOBUTamine 2.5 mcg/kg/min (09/15/22 0347)   furosemide (LASIX) 200 mg in dextrose 5 % 100 mL (2 mg/mL) infusion 20 mg/hr (09/15/22 0843)   heparin 1,000 Units/hr (09/15/22 0222)    PRN Medications: sodium chloride, acetaminophen, albuterol, ondansetron (ZOFRAN) IV, sodium chloride flush  Patient Profile   Mr Trivino is 77 year old with a history of CAD, STEMI 2021, DES to LCx, permanent A fib, severe MR, HTN, HLD, sleep apnea, MR, chronic lymphedema, and HFpEF.    Admitted with A/C HFpEF.   Assessment/Plan   1. Acute on chronic HFpEF: With prominent RV dysfunction.  Echo this admission shows EF 50-55%, D-shaped septum, moderate-severe RV enlargement with moderate RV dysfunction, PASP 69, severe biatrial enlargement, moderate AS with mean gradient 21 mmHg/AVA 1.2 cm^2, IVC dilated. Patient remains markedly volume overloaded on exam. Creatinine stable at 1.48.  SBP 100s-110s.  Improving diuresis on  Lasix gtt 20 + metolazone but CVP still > 20. He remains on dobutamine 2.5 for RV support. Co-ox 73%.  - Continue dobutamine 2.5 mcg/kg/min.  - Continue Lasix gtt 20 mg/hr.  I will give metolazone 5 mg po bid today.   - LHC/RHC planned when better diuresed and creatinine stabilized.  2. Mitral regurgitation: Severe MR by echo.  Mechanism uncertain, may be atrial functional MR with severe LAE. Suspect this is driving pulmonary hypertension and RV failure.  - TEE when he is adequately diuresed to assess mitral valve.  - May be candidate for Mitraclip but will need need significant optimization.  3. AKI on CKD 3: Suspect cardiorenal syndrome. Creatinine stable at 1.48.   - Continue dobutamine as above.  4. Atrial fibrillation: Permanent, has been seen by EP in the past. Very unlikely to successfully cardiovert and stay in NSR.  - heparin gtt for now.  5. CAD: No chest pain.  Had DES to mLCx in 10/21.   - Consider coronary angiography ?pre-Mitraclip if creatinine comes down.  - Continue atorvastatin.  6. LE  wounds: Followed as outpatient by Dr. Sharol Given.  ABIs normal this admission.  - Wound care  Loralie Champagne 09/15/2022 10:00 AM

## 2022-09-15 NOTE — Progress Notes (Signed)
ANTICOAGULATION CONSULT NOTE  Pharmacy Consult for Heparin Indication: atrial fibrillation  Allergies  Allergen Reactions   Flecainide     Per patient dizziness, shortness of breath, blurred vision   Metoprolol Tartrate Itching   Rofecoxib Rash and Other (See Comments)    (Vioxx)    Patient Measurements: Height: '5\' 8"'$  (172.7 cm) Weight: (!) 139.4 kg (307 lb 5.1 oz) IBW/kg (Calculated) : 68.4 Heparin Dosing Weight: 100 kg  Vital Signs: Temp: 97.5 F (36.4 C) (02/23 2044) Temp Source: Oral (02/23 2044) BP: 103/63 (02/24 0000) Pulse Rate: 82 (02/24 0000)  Labs: Recent Labs    09/12/22 2101 09/13/22 0721 09/13/22 0721 09/13/22 1853 09/14/22 0420 09/14/22 0445 09/14/22 1253 09/15/22 0126  HGB 11.8*  --   --   --   --  11.3*  --   --   HCT 36.6*  --   --   --   --  33.0*  --   --   PLT 258  --   --   --   --  211  --   --   APTT  --  >200*   < > 139*  --  >200* 39* 47*  HEPARINUNFRC  --  >1.10*  --  >1.10*  --  >1.10*  --   --   CREATININE 1.33* 1.62*  --   --  1.50*  --   --   --    < > = values in this interval not displayed.     Estimated Creatinine Clearance: 55.6 mL/min (A) (by C-G formula based on SCr of 1.5 mg/dL (H)).   Medical History: Past Medical History:  Diagnosis Date   A-fib [facility])    Acute on chronic diastolic CHF (congestive heart failure) ([place]) 07/21/2020   Coronary artery disease    Diabetes mellitus without complication (HCC)    Dyspnea    Dysrhythmia    A-fib   Hyperlipidemia    Hypertension    Metabolic syndrome    Prediabetes    Sleep apnea    STEMI (ST elevation myocardial infarction) ([place])     Medications:  Medications Prior to Admission  Medication Sig Dispense Refill Last Dose   albuterol (VENTOLIN HFA) 108 (90 Base) MCG/ACT inhaler Inhale 2 puffs into the lungs every 6 (six) hours as needed for wheezing or shortness of breath. 8 g 0 Past Month   apixaban (ELIQUIS) 5 MG TABS tablet Take 1 tablet (5 mg total) by mouth 2 (two)  times daily. 180 tablet 1 09/12/2022 at 0900   ascorbic acid (VITAMIN C) 1000 MG tablet Take 1 tablet (1,000 mg total) by mouth daily. 60 tablet 1 09/12/2022   atorvastatin (LIPITOR) 80 MG tablet Take 1 tablet by mouth once daily 90 tablet 3 09/12/2022   clopidogrel (PLAVIX) 75 MG tablet Take 1 tablet (75 mg total) by mouth daily with breakfast. 90 tablet 2 09/12/2022   cyanocobalamin (VITAMIN B12) 1000 MCG tablet Take 1,000 mcg by mouth daily.   09/12/2022   furosemide (LASIX) 80 MG tablet Take 1.5 tablets (120 mg total) by mouth 2 (two) times daily.   09/12/2022   metoprolol tartrate (LOPRESSOR) 25 MG tablet Take 1 tablet (25 mg total) by mouth 2 (two) times daily. 60 tablet 0 09/12/2022 at 0800   Multiple Vitamin (MULTIVITAMIN WITH MINERALS) TABS tablet Take 1 tablet by mouth daily.   09/12/2022   nitroGLYCERIN (NITROSTAT) 0.4 MG SL tablet Place 1 tablet (0.4 mg total) under the tongue every 5 (five)  minutes x 3 doses as needed for chest pain. 25 tablet 2 Past Month   nutrition supplement, JUVEN, (JUVEN) PACK Take 1 packet by mouth 2 (two) times daily between meals. 60 packet 1 09/12/2022   nystatin (MYCOSTATIN/NYSTOP) powder Apply 1 Application topically 3 (three) times daily. 15 g 0 09/12/2022   potassium chloride SA (KLOR-CON M) 20 MEQ tablet Take 2 tablets (40 mEq total) by mouth daily.   09/12/2022   Tiotropium Bromide Monohydrate (SPIRIVA RESPIMAT) 1.25 MCG/ACT AERS Inhale 2 puffs into the lungs daily. 4 g 3 09/12/2022   zinc sulfate 220 (50 Zn) MG capsule Take 1 capsule (220 mg total) by mouth daily. 60 capsule 0 09/12/2022   fluconazole (DIFLUCAN) 150 MG tablet Take 1 tablet (150 mg total) by mouth once a week. (Patient not taking: Reported on 09/12/2022) 2 tablet 0 Completed Course   sulfamethoxazole-trimethoprim (BACTRIM DS) 800-160 MG tablet Take 1 tablet by mouth 2 (two) times daily. (Patient not taking: Reported on 09/12/2022) 20 tablet 0 Completed Course    Assessment: 77 y.o. M presents for TEE  and probable R/L heart cath on 2/23. Pt on apixaban PTA for afib - last dose 2/21 0900. To hold apixaban and start heparin bridge. Apixaban will be affecting heparin level so will utilize aPTT for monitoring until levels correlate.  Morning level resulted with aPTT > 200 and HL > 1.10. Spoke with nursing confirmed that level was drawn from separate arm from heparin. No issues with bleeding.   2/24 AM update:  aPTT sub-therapeutic after re-start  Goal of Therapy:  Heparin level 0.3-0.7 units/ml aPTT 66-102 seconds Monitor platelets by anticoagulation protocol: Yes   Plan:  Inc heparin to 1000 units/hr 1000 aPTT and heparin level  Narda Bonds, PharmD, BCPS Clinical Pharmacist Phone: [phone]

## 2022-09-15 NOTE — Evaluation (Signed)
Occupational Therapy Evaluation Patient Details Name: Ryan Garner MRN: [id number] DOB: 1946-03-20 Today's Date: 09/15/2022   History of Present Illness Pt is a 77 y.o. M who presents 09/12/2022 with A/C HFpEF. Significant PMH: CAD, STEMI 2021, DES to LCx, permanent A fib, severe MR, HTN, HLD, sleep apnea, DM, rotator cuff repair, MR, chronic lymphedema, and HFpEF.   Clinical Impression   [name] was evaluated s/p the above admission list. He is generally mod I at baseline and is active with HHPT and a HHRN. Upon evaluation he was limited by general malaise, flat affect, decreased activity tolerance and generalized weakness. Overall he required mod A for simple transfers and min G for mobility with RW. Due to the deficits listed below, he requires up to max A for LB ADLs and set up A for UB ADLs in sitting. Pt will benefit from continued acute OT services. Recommend d/c to home with Coco.      Recommendations for follow up therapy are one component of a multi-disciplinary discharge planning process, led by the attending physician.  Recommendations may be updated based on patient status, additional functional criteria and insurance authorization.   Follow Up Recommendations  Home health OT     Assistance Recommended at Discharge Frequent or constant Supervision/Assistance  Patient can return home with the following A little help with walking and/or transfers;A little help with bathing/dressing/bathroom;Assistance with cooking/housework;Assist for transportation;Help with stairs or ramp for entrance    Functional Status Assessment  Patient has had a recent decline in their functional status and demonstrates the ability to make significant improvements in function in a reasonable and predictable amount of time.  Equipment Recommendations  None recommended by OT       Precautions / Restrictions Precautions Precautions: Fall Restrictions Weight Bearing Restrictions: No      Mobility Bed  Mobility Overal bed mobility: Needs Assistance Bed Mobility: Sit to Supine       Sit to supine: Mod assist        Transfers Overall transfer level: Needs assistance Equipment used: Rolling walker (2 wheels) Transfers: Sit to/from Stand Sit to Stand: Mod assist           General transfer comment: mod A from lower chair height      Balance Overall balance assessment: Needs assistance Sitting-balance support: Feet supported Sitting balance-Leahy Scale: Fair     Standing balance support: Bilateral upper extremity supported, Reliant on assistive device for balance Standing balance-Leahy Scale: Poor                             ADL either performed or assessed with clinical judgement   ADL Overall ADL's : Needs assistance/impaired Eating/Feeding: Independent;Sitting   Grooming: Set up;Sitting   Upper Body Bathing: Set up;Sitting   Lower Body Bathing: Maximal assistance;Sit to/from stand   Upper Body Dressing : Set up;Sitting   Lower Body Dressing: Maximal assistance;Sit to/from stand   Toilet Transfer: Moderate assistance;Stand-pivot;BSC/3in1   Toileting- Clothing Manipulation and Hygiene: Maximal assistance;Sit to/from stand       Functional mobility during ADLs: Moderate assistance;Cueing for safety;Cueing for sequencing;Rolling walker (2 wheels) General ADL Comments: limited by weakness, body habitus, activity tolerance     Vision Baseline Vision/History: 0 No visual deficits Vision Assessment?: No apparent visual deficits     Perception Perception Perception Tested?: No   Praxis Praxis Praxis tested?: Within functional limits    Pertinent Vitals/Pain Pain Assessment Pain Assessment: No/denies pain  Hand Dominance Right   Extremity/Trunk Assessment Upper Extremity Assessment Upper Extremity Assessment: Generalized weakness   Lower Extremity Assessment Lower Extremity Assessment: Defer to PT evaluation   Cervical / Trunk  Assessment Cervical / Trunk Assessment: Kyphotic   Communication Communication Communication: No difficulties   Cognition Arousal/Alertness: Awake/alert Behavior During Therapy: Flat affect Overall Cognitive Status: No family/caregiver present to determine baseline cognitive functioning                                 General Comments: Pt with flat affect, difficulty describing symptoms and PLOF     General Comments  VSS on 2L            Home Living Family/patient expects to be discharged to:: Private residence Living Arrangements: Alone Available Help at Discharge: Friend(s);Available PRN/intermittently;Family Type of Home: House Home Access: Level entry;Ramped entrance     Home Layout: Two level;Other (Comment)         Bathroom Toilet: Handicapped height     Home Equipment: Cane - single point;Grab bars - toilet;Grab bars - tub/shower;Wheelchair - manual          Prior Functioning/Environment Prior Level of Function : Independent/Modified Independent;Driving             Mobility Comments: uses stand up walker - active with HHPT ADLs Comments: Pt has housekeeper 1x/wk        OT Problem List: Decreased strength;Decreased activity tolerance;Decreased range of motion;Impaired balance (sitting and/or standing);Decreased knowledge of precautions      OT Treatment/Interventions: Self-care/ADL training;Therapeutic exercise;DME and/or AE instruction;Therapeutic activities;Balance training;Patient/family education    OT Goals(Current goals can be found in the care plan section) Acute Rehab OT Goals Patient Stated Goal: home OT Goal Formulation: With patient Time For Goal Achievement: 09/29/22 Potential to Achieve Goals: Good ADL Goals Pt Will Perform Lower Body Bathing: with min guard assist;sit to/from stand Pt Will Perform Lower Body Dressing: with min guard assist;sit to/from stand Pt Will Transfer to Toilet: with  supervision;ambulating Additional ADL Goal #1: Pt will indep recall at least 3 energy conservation techniques to apply at d/c  OT Frequency: Min 2X/week       AM-PAC OT "6 Clicks" Daily Activity     Outcome Measure Help from another person eating meals?: None Help from another person taking care of personal grooming?: A Little Help from another person toileting, which includes using toliet, bedpan, or urinal?: A Lot Help from another person bathing (including washing, rinsing, drying)?: A Lot Help from another person to put on and taking off regular upper body clothing?: A Little Help from another person to put on and taking off regular lower body clothing?: A Lot 6 Click Score: 16   End of Session Equipment Utilized During Treatment: Rolling walker (2 wheels);Oxygen Nurse Communication: Mobility status  Activity Tolerance: Patient tolerated treatment well Patient left: in bed;with call bell/phone within reach  OT Visit Diagnosis: Unsteadiness on feet (R26.81);Other abnormalities of gait and mobility (R26.89);Muscle weakness (generalized) (M62.81)                Time: [id number] OT Time Calculation (min): 20 min Charges:  OT General Charges $OT Visit: 1 Visit OT Evaluation $OT Eval Moderate Complexity: [place], OTR/L [place] [facility] [place] Communication Preferred   Elliot Cousin 09/15/2022, 2:47 PM

## 2022-09-15 NOTE — Progress Notes (Signed)
ANTICOAGULATION CONSULT NOTE  Pharmacy Consult for Heparin Indication: atrial fibrillation  Allergies  Allergen Reactions   Flecainide     Per patient dizziness, shortness of breath, blurred vision   Metoprolol Tartrate Itching   Rofecoxib Rash and Other (See Comments)    (Vioxx)    Patient Measurements: Height: '5\' 8"'$  (172.7 cm) Weight: (!) 136.4 kg (300 lb 11.2 oz) IBW/kg (Calculated) : 68.4 Heparin Dosing Weight: 100 kg  Vital Signs: Temp: 97.4 F (36.3 C) (02/24 1317) Temp Source: Oral (02/24 1317) BP: 107/72 (02/24 1842) Pulse Rate: 73 (02/24 1317)  Labs: Recent Labs    09/12/22 2101 09/12/22 2101 09/13/22 0721 09/13/22 1853 09/14/22 0420 09/14/22 0445 09/14/22 1253 09/15/22 0126 09/15/22 0409 09/15/22 1024 09/15/22 2017  HGB 11.8*  --   --   --   --  11.3*  --   --  11.3*  --   --   HCT 36.6*  --   --   --   --  33.0*  --   --  33.1*  --   --   PLT 258  --   --   --   --  211  --   --  213  --   --   APTT  --    < > >200* 139*  --  >200*   < > 47*  --  49* 68*  HEPARINUNFRC  --    < > >1.10* >1.10*  --  >1.10*  --   --   --  0.81*  --   CREATININE 1.33*  --  1.62*  --  1.50*  --   --   --  1.48*  --   --    < > = values in this interval not displayed.     Estimated Creatinine Clearance: 55.6 mL/min (A) (by C-G formula based on SCr of 1.48 mg/dL (H)).   Medical History: Past Medical History:  Diagnosis Date   A-fib [facility])    Acute on chronic diastolic CHF (congestive heart failure) ([place]) 07/21/2020   Coronary artery disease    Diabetes mellitus without complication (HCC)    Dyspnea    Dysrhythmia    A-fib   Hyperlipidemia    Hypertension    Metabolic syndrome    Prediabetes    Sleep apnea    STEMI (ST elevation myocardial infarction) ([place])     Medications:  Medications Prior to Admission  Medication Sig Dispense Refill Last Dose   albuterol (VENTOLIN HFA) 108 (90 Base) MCG/ACT inhaler Inhale 2 puffs into the lungs every 6 (six) hours as  needed for wheezing or shortness of breath. 8 g 0 Past Month   apixaban (ELIQUIS) 5 MG TABS tablet Take 1 tablet (5 mg total) by mouth 2 (two) times daily. 180 tablet 1 09/12/2022 at 0900   ascorbic acid (VITAMIN C) 1000 MG tablet Take 1 tablet (1,000 mg total) by mouth daily. 60 tablet 1 09/12/2022   atorvastatin (LIPITOR) 80 MG tablet Take 1 tablet by mouth once daily 90 tablet 3 09/12/2022   clopidogrel (PLAVIX) 75 MG tablet Take 1 tablet (75 mg total) by mouth daily with breakfast. 90 tablet 2 09/12/2022   cyanocobalamin (VITAMIN B12) 1000 MCG tablet Take 1,000 mcg by mouth daily.   09/12/2022   furosemide (LASIX) 80 MG tablet Take 1.5 tablets (120 mg total) by mouth 2 (two) times daily.   09/12/2022   metoprolol tartrate (LOPRESSOR) 25 MG tablet Take 1 tablet (25 mg total)  by mouth 2 (two) times daily. 60 tablet 0 09/12/2022 at 0800   Multiple Vitamin (MULTIVITAMIN WITH MINERALS) TABS tablet Take 1 tablet by mouth daily.   09/12/2022   nitroGLYCERIN (NITROSTAT) 0.4 MG SL tablet Place 1 tablet (0.4 mg total) under the tongue every 5 (five) minutes x 3 doses as needed for chest pain. 25 tablet 2 Past Month   nutrition supplement, JUVEN, (JUVEN) PACK Take 1 packet by mouth 2 (two) times daily between meals. 60 packet 1 09/12/2022   nystatin (MYCOSTATIN/NYSTOP) powder Apply 1 Application topically 3 (three) times daily. 15 g 0 09/12/2022   potassium chloride SA (KLOR-CON M) 20 MEQ tablet Take 2 tablets (40 mEq total) by mouth daily.   09/12/2022   Tiotropium Bromide Monohydrate (SPIRIVA RESPIMAT) 1.25 MCG/ACT AERS Inhale 2 puffs into the lungs daily. 4 g 3 09/12/2022   zinc sulfate 220 (50 Zn) MG capsule Take 1 capsule (220 mg total) by mouth daily. 60 capsule 0 09/12/2022   fluconazole (DIFLUCAN) 150 MG tablet Take 1 tablet (150 mg total) by mouth once a week. (Patient not taking: Reported on 09/12/2022) 2 tablet 0 Completed Course   sulfamethoxazole-trimethoprim (BACTRIM DS) 800-160 MG tablet Take 1 tablet by  mouth 2 (two) times daily. (Patient not taking: Reported on 09/12/2022) 20 tablet 0 Completed Course    Assessment: 77 y.o. M presents for TEE and probable R/L heart cath on 2/23. Pt on apixaban PTA for afib - last dose 2/21 0900. To hold apixaban and start heparin bridge. Apixaban will be affecting heparin level so will utilize aPTT for monitoring until levels correlate.  -APTT 68 on 1250 units/hr   Goal of Therapy:  Heparin level 0.3-0.7 units/ml aPTT 66-102 seconds Monitor platelets by anticoagulation protocol: Yes   Plan:  Continue heparin infusion to 1250 units/hr  Monitor daily heparin level and CBC   Hildred Laser, PharmD Clinical Pharmacist **Pharmacist phone directory can now be found on [email or web address] ([email or web address]).  Listed under [place].

## 2022-09-15 NOTE — Progress Notes (Signed)
ANTICOAGULATION CONSULT NOTE  Pharmacy Consult for Heparin Indication: atrial fibrillation  Allergies  Allergen Reactions   Flecainide     Per patient dizziness, shortness of breath, blurred vision   Metoprolol Tartrate Itching   Rofecoxib Rash and Other (See Comments)    (Vioxx)    Patient Measurements: Height: '5\' 8"'$  (172.7 cm) Weight: (!) 136.4 kg (300 lb 11.2 oz) IBW/kg (Calculated) : 68.4 Heparin Dosing Weight: 100 kg  Vital Signs: Temp: 97.4 F (36.3 C) (02/24 0847) Temp Source: Oral (02/24 0847) BP: 118/78 (02/24 0847) Pulse Rate: 63 (02/24 0847)  Labs: Recent Labs    09/12/22 2101 09/12/22 2101 09/13/22 0721 09/13/22 1853 09/14/22 0420 09/14/22 0445 09/14/22 1253 09/15/22 0126 09/15/22 0409  HGB 11.8*  --   --   --   --  11.3*  --   --  11.3*  HCT 36.6*  --   --   --   --  33.0*  --   --  33.1*  PLT 258  --   --   --   --  211  --   --  213  APTT  --    < > >200* 139*  --  >200* 39* 47*  --   HEPARINUNFRC  --   --  >1.10* >1.10*  --  >1.10*  --   --   --   CREATININE 1.33*  --  1.62*  --  1.50*  --   --   --  1.48*   < > = values in this interval not displayed.     Estimated Creatinine Clearance: 55.6 mL/min (A) (by C-G formula based on SCr of 1.48 mg/dL (H)).   Medical History: Past Medical History:  Diagnosis Date   A-fib [facility])    Acute on chronic diastolic CHF (congestive heart failure) ([place]) 07/21/2020   Coronary artery disease    Diabetes mellitus without complication (HCC)    Dyspnea    Dysrhythmia    A-fib   Hyperlipidemia    Hypertension    Metabolic syndrome    Prediabetes    Sleep apnea    STEMI (ST elevation myocardial infarction) ([place])     Medications:  Medications Prior to Admission  Medication Sig Dispense Refill Last Dose   albuterol (VENTOLIN HFA) 108 (90 Base) MCG/ACT inhaler Inhale 2 puffs into the lungs every 6 (six) hours as needed for wheezing or shortness of breath. 8 g 0 Past Month   apixaban (ELIQUIS) 5 MG TABS  tablet Take 1 tablet (5 mg total) by mouth 2 (two) times daily. 180 tablet 1 09/12/2022 at 0900   ascorbic acid (VITAMIN C) 1000 MG tablet Take 1 tablet (1,000 mg total) by mouth daily. 60 tablet 1 09/12/2022   atorvastatin (LIPITOR) 80 MG tablet Take 1 tablet by mouth once daily 90 tablet 3 09/12/2022   clopidogrel (PLAVIX) 75 MG tablet Take 1 tablet (75 mg total) by mouth daily with breakfast. 90 tablet 2 09/12/2022   cyanocobalamin (VITAMIN B12) 1000 MCG tablet Take 1,000 mcg by mouth daily.   09/12/2022   furosemide (LASIX) 80 MG tablet Take 1.5 tablets (120 mg total) by mouth 2 (two) times daily.   09/12/2022   metoprolol tartrate (LOPRESSOR) 25 MG tablet Take 1 tablet (25 mg total) by mouth 2 (two) times daily. 60 tablet 0 09/12/2022 at 0800   Multiple Vitamin (MULTIVITAMIN WITH MINERALS) TABS tablet Take 1 tablet by mouth daily.   09/12/2022   nitroGLYCERIN (NITROSTAT) 0.4 MG SL tablet  Place 1 tablet (0.4 mg total) under the tongue every 5 (five) minutes x 3 doses as needed for chest pain. 25 tablet 2 Past Month   nutrition supplement, JUVEN, (JUVEN) PACK Take 1 packet by mouth 2 (two) times daily between meals. 60 packet 1 09/12/2022   nystatin (MYCOSTATIN/NYSTOP) powder Apply 1 Application topically 3 (three) times daily. 15 g 0 09/12/2022   potassium chloride SA (KLOR-CON M) 20 MEQ tablet Take 2 tablets (40 mEq total) by mouth daily.   09/12/2022   Tiotropium Bromide Monohydrate (SPIRIVA RESPIMAT) 1.25 MCG/ACT AERS Inhale 2 puffs into the lungs daily. 4 g 3 09/12/2022   zinc sulfate 220 (50 Zn) MG capsule Take 1 capsule (220 mg total) by mouth daily. 60 capsule 0 09/12/2022   fluconazole (DIFLUCAN) 150 MG tablet Take 1 tablet (150 mg total) by mouth once a week. (Patient not taking: Reported on 09/12/2022) 2 tablet 0 Completed Course   sulfamethoxazole-trimethoprim (BACTRIM DS) 800-160 MG tablet Take 1 tablet by mouth 2 (two) times daily. (Patient not taking: Reported on 09/12/2022) 20 tablet 0 Completed  Course    Assessment: 77 y.o. M presents for TEE and probable R/L heart cath on 2/23. Pt on apixaban PTA for afib - last dose 2/21 0900. To hold apixaban and start heparin bridge. Apixaban will be affecting heparin level so will utilize aPTT for monitoring until levels correlate.   Heparin level 0.81. APTT 49 on drip rate 1000 units/hr subtherapeutic. APTT unchanged after last rate increase. Hgb 11.3 and plt 213, stable. No s/sx bleeding or issues with heparin infusion noted. Continue to monitor with aPTT until correlates with heparin level.    Goal of Therapy:  Heparin level 0.3-0.7 units/ml aPTT 66-102 seconds Monitor platelets by anticoagulation protocol: Yes   Plan:  Increase heparin infusion to 1250 units/hr  Will f/u aPTT in 8 hours Monitor daily heparin level and CBC Continue to monitor H&H     Gena Fray, PharmD PGY1 Pharmacy Resident   09/15/2022 12:04 PM

## 2022-09-16 DIAGNOSIS — I5033 Acute on chronic diastolic (congestive) heart failure: Secondary | ICD-10-CM | POA: Diagnosis not present

## 2022-09-16 LAB — BASIC METABOLIC PANEL
Anion gap: 10 (ref 5–15)
BUN: 48 mg/dL — ABNORMAL HIGH (ref 8–23)
CO2: 25 mmol/L (ref 22–32)
Calcium: 9.2 mg/dL (ref 8.9–10.3)
Chloride: 94 mmol/L — ABNORMAL LOW (ref 98–111)
Creatinine, Ser: 1.49 mg/dL — ABNORMAL HIGH (ref 0.61–1.24)
GFR, Estimated: 48 mL/min — ABNORMAL LOW (ref >60)
Glucose, Bld: 127 mg/dL — ABNORMAL HIGH (ref 70–99)
Potassium: 3.8 mmol/L (ref 3.5–5.1)
Sodium: 129 mmol/L — ABNORMAL LOW (ref 135–145)

## 2022-09-16 LAB — COOXEMETRY PANEL
Carboxyhemoglobin: 2.4 % — ABNORMAL HIGH (ref 0.5–1.5)
Methemoglobin: <0.7 % (ref 0.0–1.5)
O2 Saturation: 82.5 %
Total hemoglobin: 11 g/dL — ABNORMAL LOW (ref 12.0–16.0)

## 2022-09-16 LAB — CBC
HCT: 31.7 % — ABNORMAL LOW (ref 39.0–52.0)
Hemoglobin: 10.7 g/dL — ABNORMAL LOW (ref 13.0–17.0)
MCH: 29.6 pg (ref 26.0–34.0)
MCHC: 33.8 g/dL (ref 30.0–36.0)
MCV: 87.8 fL (ref 80.0–100.0)
Platelets: 221 10*3/uL (ref 150–400)
RBC: 3.61 MIL/uL — ABNORMAL LOW (ref 4.22–5.81)
RDW: 16.3 % — ABNORMAL HIGH (ref 11.5–15.5)
WBC: 7.4 10*3/uL (ref 4.0–10.5)
nRBC: 0 % (ref 0.0–0.2)

## 2022-09-16 LAB — APTT: aPTT: 65 seconds — ABNORMAL HIGH (ref 24–36)

## 2022-09-16 LAB — HEPARIN LEVEL (UNFRACTIONATED): Heparin Unfractionated: 0.67 IU/mL (ref 0.30–0.70)

## 2022-09-16 MED ORDER — GUAIFENESIN-DM 100-10 MG/5ML PO SYRP
5.0000 mL | ORAL_SOLUTION | ORAL | Status: DC | PRN
  Administered 2022-09-16 – 2022-10-04 (×19): 5 mL via ORAL
  Filled 2022-09-16 (×19): qty 5

## 2022-09-16 MED ORDER — MAGNESIUM HYDROXIDE 400 MG/5ML PO SUSP
15.0000 mL | Freq: Every day | ORAL | Status: AC | PRN
  Administered 2022-09-16 – 2022-10-04 (×3): 15 mL via ORAL
  Filled 2022-09-16 (×5): qty 30

## 2022-09-16 MED ORDER — METOLAZONE 5 MG PO TABS
5.0000 mg | ORAL_TABLET | Freq: Two times a day (BID) | ORAL | Status: AC
  Administered 2022-09-16 (×2): 5 mg via ORAL
  Filled 2022-09-16 (×2): qty 1

## 2022-09-16 NOTE — Progress Notes (Signed)
Patient ID: Ryan Garner, male   DOB: Dec 10, 1945, 77 y.o.   MRN: [id number]     Advanced Heart Failure Rounding Note  PCP-Cardiologist: Ryan Carnes, MD   Subjective:    Patient diuresed yesterday with stable creatinine 1.49, I/Os net negative 7L.  Think weights inaccurate.  He is on Lasix gtt 20 mg/hr, got metolazone 5 mg bid yesterday.  CVP lower at 16-17.   Dobutamine 2.5 for RV support with co-ox 82%.  Breathing better, walking with rollator.  Not very interactive.   Objective:   Weight Range: (!) 136.9 kg Body mass index is 45.89 kg/m.   Vital Signs:   Temp:  [97 F (36.1 C)-98.5 F (36.9 C)] 98.5 F (36.9 C) (02/25 0806) Pulse Rate:  [35-95] 95 (02/25 0806) Resp:  [16-18] 16 (02/25 0806) BP: (98-111)/(58-74) 104/65 (02/25 0806) SpO2:  [93 %-100 %] 93 % (02/25 0806) Weight:  [136.9 kg] 136.9 kg (02/25 0109) Last BM Date : 09/15/22  Weight change: Filed Weights   09/14/22 0559 09/15/22 0331 09/16/22 0109  Weight: (!) 139.4 kg (!) 136.4 kg (!) 136.9 kg    Intake/Output:   Intake/Output Summary (Last 24 hours) at 09/16/2022 0943 Last data filed at 09/16/2022 0842 Gross per 24 hour  Intake 360 ml  Output 7125 ml  Net -6765 ml    Physical Exam   CVP 16-17 General: NAD Neck: JVP 16 cm, no thyromegaly or thyroid nodule.  Lungs: Clear to auscultation bilaterally with normal respiratory effort. CV: Nondisplaced PMI.  Heart irregular S1/S2, no S3/S4, 2/6 HSM apex.  2+ edema to thighs.  Abdomen: Soft, nontender, no hepatosplenomegaly, no distention.  Skin: Intact without lesions or rashes.  Neurologic: Alert and oriented x 3.  Psych: Normal affect. Extremities: No clubbing or cyanosis.  HEENT: Normal.    Telemetry   Atrial fibrillation 60s (Personally reviewed)    EKG    No new EKG to review  Labs    CBC Recent Labs    09/15/22 0409 09/16/22 0225  WBC 6.4 7.4  HGB 11.3* 10.7*  HCT 33.1* 31.7*  MCV 88.0 87.8  PLT 213 [id number]   Basic Metabolic  Panel Recent Labs    09/15/22 0409 09/16/22 0225  NA 130* 129*  K 4.1 3.8  CL 97* 94*  CO2 23 25  GLUCOSE 124* 127*  BUN 56* 48*  CREATININE 1.48* 1.49*  CALCIUM 9.1 9.2   Liver Function Tests No results for input(s): "AST", "ALT", "ALKPHOS", "BILITOT", "PROT", "ALBUMIN" in the last 72 hours.  No results for input(s): "LIPASE", "AMYLASE" in the last 72 hours. Cardiac Enzymes No results for input(s): "CKTOTAL", "CKMB", "CKMBINDEX", "TROPONINI" in the last 72 hours.  BNP: BNP (last 3 results) Recent Labs    11/20/21 1409 04/06/22 1253 09/12/22 2101  BNP 485.0* 682.5* 577.3*    ProBNP (last 3 results) Recent Labs    08/15/22 1049 09/04/22 1404 09/11/22 1530  PROBNP 5,315* 11,525* 8,444*    Other results:   Imaging   No results found.  Medications:     Scheduled Medications:  atorvastatin  80 mg Oral Daily   Chlorhexidine Gluconate Cloth  6 each Topical Daily   metolazone  5 mg Oral BID   potassium chloride  40 mEq Oral BID   sodium chloride flush  3 mL Intravenous Q12H   umeclidinium bromide  1 puff Inhalation Daily    Infusions:  sodium chloride     DOBUTamine 2.5 mcg/kg/min (09/15/22 0347)   furosemide (LASIX) 200  mg in dextrose 5 % 100 mL (2 mg/mL) infusion 20 mg/hr (09/16/22 0510)   heparin 1,250 Units/hr (09/15/22 1654)    PRN Medications: sodium chloride, acetaminophen, albuterol, ondansetron (ZOFRAN) IV, sodium chloride flush  Patient Profile   Mr Ryan Garner is 77 year old with a history of CAD, STEMI 2021, DES to LCx, permanent A fib, severe MR, HTN, HLD, sleep apnea, MR, chronic lymphedema, and HFpEF.    Admitted with A/C HFpEF.   Assessment/Plan   1. Acute on chronic HFpEF: With prominent RV dysfunction.  Echo this admission shows EF 50-55%, D-shaped septum, moderate-severe RV enlargement with moderate RV dysfunction, PASP 69, severe biatrial enlargement, moderate AS with mean gradient 21 mmHg/AVA 1.2 cm^2, IVC dilated. Patient remains  markedly volume overloaded on exam. Creatinine stable at 1.49. SBP 100s-110s.  Improving diuresis on Lasix gtt 20 + metolazone 5 mg bid, CVP lower at 16-17. He remains on dobutamine 2.5 for RV support. Co-ox 82%.  - Continue dobutamine 2.5 mcg/kg/min.  - Continue Lasix gtt 20 mg/hr.  I will give metolazone 5 mg po bid again today.   - LHC/RHC planned when better diuresed and creatinine stabilized.  2. Mitral regurgitation: Severe MR by echo.  Mechanism uncertain, may be atrial functional MR with severe LAE. Suspect this is driving pulmonary hypertension and RV failure.  - TEE when he is adequately diuresed to assess mitral valve.  - May be candidate for Mitraclip but will need need significant optimization.  3. AKI on CKD 3: Suspect cardiorenal syndrome. Creatinine stable at 1.49.   - Continue dobutamine as above.  4. Atrial fibrillation: Permanent, has been seen by EP in the past. Very unlikely to successfully cardiovert and stay in NSR.  - heparin gtt for now.  5. CAD: No chest pain.  Had DES to mLCx in 10/21.   - Consider coronary angiography ?pre-Mitraclip if creatinine comes down.  - Continue atorvastatin.  6. LE wounds: Followed as outpatient by Dr. Sharol Garner.  ABIs normal this admission.  - Wound care  Continue to mobilize.   Loralie Champagne 09/16/2022 9:43 AM

## 2022-09-16 NOTE — Plan of Care (Signed)

## 2022-09-16 NOTE — Progress Notes (Signed)
ANTICOAGULATION CONSULT NOTE  Pharmacy Consult for Heparin Indication: atrial fibrillation  Allergies  Allergen Reactions   Flecainide     Per patient dizziness, shortness of breath, blurred vision   Metoprolol Tartrate Itching   Rofecoxib Rash and Other (See Comments)    (Vioxx)    Patient Measurements: Height: '5\' 8"'$  (172.7 cm) Weight: (!) 136.9 kg (301 lb 13 oz) IBW/kg (Calculated) : 68.4 Heparin Dosing Weight: 100 kg  Vital Signs: Temp: 97.6 F (36.4 C) (02/25 0532) Temp Source: Oral (02/25 0532) BP: 108/74 (02/25 0532) Pulse Rate: 35 (02/25 0532)  Labs: Recent Labs    09/13/22 1853 09/14/22 0420 09/14/22 0445 09/14/22 1253 09/15/22 0409 09/15/22 1024 09/15/22 2017 09/16/22 0225 09/16/22 0500  HGB   < >  --  11.3*  --  11.3*  --   --  10.7*  --   HCT  --   --  33.0*  --  33.1*  --   --  31.7*  --   PLT  --   --  211  --  213  --   --  221  --   APTT  --   --  >200*   < >  --  49* 68* 65*  --   HEPARINUNFRC  --   --  >1.10*  --   --  0.81*  --   --  0.67  CREATININE  --  1.50*  --   --  1.48*  --   --  1.49*  --    < > = values in this interval not displayed.     Estimated Creatinine Clearance: 55.4 mL/min (A) (by C-G formula based on SCr of 1.49 mg/dL (H)).   Medical History: Past Medical History:  Diagnosis Date   A-fib [facility])    Acute on chronic diastolic CHF (congestive heart failure) ([place]) 07/21/2020   Coronary artery disease    Diabetes mellitus without complication (HCC)    Dyspnea    Dysrhythmia    A-fib   Hyperlipidemia    Hypertension    Metabolic syndrome    Prediabetes    Sleep apnea    STEMI (ST elevation myocardial infarction) ([place])     Medications:  Medications Prior to Admission  Medication Sig Dispense Refill Last Dose   albuterol (VENTOLIN HFA) 108 (90 Base) MCG/ACT inhaler Inhale 2 puffs into the lungs every 6 (six) hours as needed for wheezing or shortness of breath. 8 g 0 Past Month   apixaban (ELIQUIS) 5 MG TABS tablet  Take 1 tablet (5 mg total) by mouth 2 (two) times daily. 180 tablet 1 09/12/2022 at 0900   ascorbic acid (VITAMIN C) 1000 MG tablet Take 1 tablet (1,000 mg total) by mouth daily. 60 tablet 1 09/12/2022   atorvastatin (LIPITOR) 80 MG tablet Take 1 tablet by mouth once daily 90 tablet 3 09/12/2022   clopidogrel (PLAVIX) 75 MG tablet Take 1 tablet (75 mg total) by mouth daily with breakfast. 90 tablet 2 09/12/2022   cyanocobalamin (VITAMIN B12) 1000 MCG tablet Take 1,000 mcg by mouth daily.   09/12/2022   furosemide (LASIX) 80 MG tablet Take 1.5 tablets (120 mg total) by mouth 2 (two) times daily.   09/12/2022   metoprolol tartrate (LOPRESSOR) 25 MG tablet Take 1 tablet (25 mg total) by mouth 2 (two) times daily. 60 tablet 0 09/12/2022 at 0800   Multiple Vitamin (MULTIVITAMIN WITH MINERALS) TABS tablet Take 1 tablet by mouth daily.   09/12/2022   nitroGLYCERIN (  NITROSTAT) 0.4 MG SL tablet Place 1 tablet (0.4 mg total) under the tongue every 5 (five) minutes x 3 doses as needed for chest pain. 25 tablet 2 Past Month   nutrition supplement, JUVEN, (JUVEN) PACK Take 1 packet by mouth 2 (two) times daily between meals. 60 packet 1 09/12/2022   nystatin (MYCOSTATIN/NYSTOP) powder Apply 1 Application topically 3 (three) times daily. 15 g 0 09/12/2022   potassium chloride SA (KLOR-CON M) 20 MEQ tablet Take 2 tablets (40 mEq total) by mouth daily.   09/12/2022   Tiotropium Bromide Monohydrate (SPIRIVA RESPIMAT) 1.25 MCG/ACT AERS Inhale 2 puffs into the lungs daily. 4 g 3 09/12/2022   zinc sulfate 220 (50 Zn) MG capsule Take 1 capsule (220 mg total) by mouth daily. 60 capsule 0 09/12/2022   fluconazole (DIFLUCAN) 150 MG tablet Take 1 tablet (150 mg total) by mouth once a week. (Patient not taking: Reported on 09/12/2022) 2 tablet 0 Completed Course   sulfamethoxazole-trimethoprim (BACTRIM DS) 800-160 MG tablet Take 1 tablet by mouth 2 (two) times daily. (Patient not taking: Reported on 09/12/2022) 20 tablet 0 Completed Course     Assessment: 77 y.o. M presents with permanent afib. Pt on apixaban PTA for afib - last dose 2/21 0900. To hold apixaban and start heparin bridge. Apixaban will be affecting heparin level so will utilize aPTT for monitoring until levels correlate. Per cardiology, stay on heparin drip for now.   Heparin level 0.67 and aPTT 65 on drip rate 1259 units/hr therapeutic. Levels continue to be therapeutic. Hgb 10.7 and plt 221. No s/sx bleeding noted in chart. With heparin level and aPTT now correlating, will monitor with daily heparin levels.  Goal of Therapy:  Heparin level 0.3-0.7 units/ml aPTT 66-102 seconds Monitor platelets by anticoagulation protocol: Yes   Plan:  Continue heparin infusion at 1250 units/hr  Monitor daily heparin level and CBC Trend H&H  Gena Fray, PharmD PGY1 Pharmacy Resident   09/16/2022 8:03 AM

## 2022-09-17 ENCOUNTER — Inpatient Hospital Stay (HOSPITAL_COMMUNITY): Payer: Medicare Other

## 2022-09-17 DIAGNOSIS — I5023 Acute on chronic systolic (congestive) heart failure: Secondary | ICD-10-CM | POA: Diagnosis present

## 2022-09-17 DIAGNOSIS — K5730 Diverticulosis of large intestine without perforation or abscess without bleeding: Secondary | ICD-10-CM | POA: Diagnosis not present

## 2022-09-17 DIAGNOSIS — N281 Cyst of kidney, acquired: Secondary | ICD-10-CM | POA: Diagnosis not present

## 2022-09-17 DIAGNOSIS — K4090 Unilateral inguinal hernia, without obstruction or gangrene, not specified as recurrent: Secondary | ICD-10-CM | POA: Diagnosis not present

## 2022-09-17 DIAGNOSIS — I5033 Acute on chronic diastolic (congestive) heart failure: Secondary | ICD-10-CM | POA: Diagnosis not present

## 2022-09-17 LAB — CBC
HCT: 28.4 % — ABNORMAL LOW (ref 39.0–52.0)
HCT: 27.8 % — ABNORMAL LOW (ref 39.0–52.0)
HCT: 27.4 % — ABNORMAL LOW (ref 39.0–52.0)
Hemoglobin: 9.7 g/dL — ABNORMAL LOW (ref 13.0–17.0)
Hemoglobin: 9.6 g/dL — ABNORMAL LOW (ref 13.0–17.0)
Hemoglobin: 9 g/dL — ABNORMAL LOW (ref 13.0–17.0)
MCH: 29.7 pg (ref 26.0–34.0)
MCH: 30.2 pg (ref 26.0–34.0)
MCH: 29.2 pg (ref 26.0–34.0)
MCHC: 34.2 g/dL (ref 30.0–36.0)
MCHC: 34.5 g/dL (ref 30.0–36.0)
MCHC: 32.8 g/dL (ref 30.0–36.0)
MCV: 86.9 fL (ref 80.0–100.0)
MCV: 87.4 fL (ref 80.0–100.0)
MCV: 89 fL (ref 80.0–100.0)
Platelets: 194 10*3/uL (ref 150–400)
Platelets: 189 10*3/uL (ref 150–400)
Platelets: 183 10*3/uL (ref 150–400)
RBC: 3.27 MIL/uL — ABNORMAL LOW (ref 4.22–5.81)
RBC: 3.18 MIL/uL — ABNORMAL LOW (ref 4.22–5.81)
RBC: 3.08 MIL/uL — ABNORMAL LOW (ref 4.22–5.81)
RDW: 16.2 % — ABNORMAL HIGH (ref 11.5–15.5)
RDW: 16.2 % — ABNORMAL HIGH (ref 11.5–15.5)
RDW: 16.4 % — ABNORMAL HIGH (ref 11.5–15.5)
WBC: 6.4 10*3/uL (ref 4.0–10.5)
WBC: 6.2 10*3/uL (ref 4.0–10.5)
WBC: 5.9 10*3/uL (ref 4.0–10.5)
nRBC: 0 % (ref 0.0–0.2)
nRBC: 0 % (ref 0.0–0.2)
nRBC: 0 % (ref 0.0–0.2)

## 2022-09-17 LAB — BASIC METABOLIC PANEL
Anion gap: 10 (ref 5–15)
BUN: 47 mg/dL — ABNORMAL HIGH (ref 8–23)
CO2: 29 mmol/L (ref 22–32)
Calcium: 9 mg/dL (ref 8.9–10.3)
Chloride: 90 mmol/L — ABNORMAL LOW (ref 98–111)
Creatinine, Ser: 1.39 mg/dL — ABNORMAL HIGH (ref 0.61–1.24)
GFR, Estimated: 52 mL/min — ABNORMAL LOW (ref >60)
Glucose, Bld: 115 mg/dL — ABNORMAL HIGH (ref 70–99)
Potassium: 3.6 mmol/L (ref 3.5–5.1)
Sodium: 129 mmol/L — ABNORMAL LOW (ref 135–145)

## 2022-09-17 LAB — COOXEMETRY PANEL
Carboxyhemoglobin: 2.2 % — ABNORMAL HIGH (ref 0.5–1.5)
Methemoglobin: <0.7 % (ref 0.0–1.5)
O2 Saturation: 74.1 %
Total hemoglobin: 9.9 g/dL — ABNORMAL LOW (ref 12.0–16.0)

## 2022-09-17 LAB — HEPARIN LEVEL (UNFRACTIONATED): Heparin Unfractionated: 0.4 IU/mL (ref 0.30–0.70)

## 2022-09-17 LAB — TYPE AND SCREEN
ABO/RH(D): B POS
Antibody Screen: NEGATIVE

## 2022-09-17 LAB — ABO/RH: ABO/RH(D): B POS

## 2022-09-17 LAB — GLUCOSE, CAPILLARY
Glucose-Capillary: 116 mg/dL — ABNORMAL HIGH (ref 70–99)
Glucose-Capillary: 123 mg/dL — ABNORMAL HIGH (ref 70–99)

## 2022-09-17 MED ORDER — POTASSIUM CHLORIDE 10 MEQ/50ML IV SOLN
10.0000 meq | INTRAVENOUS | Status: AC
  Administered 2022-09-17 (×2): 10 meq via INTRAVENOUS
  Filled 2022-09-17 (×2): qty 50

## 2022-09-17 MED ORDER — MORPHINE SULFATE (PF) 2 MG/ML IV SOLN
2.0000 mg | INTRAVENOUS | Status: DC | PRN
  Administered 2022-09-17 – 2022-09-18 (×4): 2 mg via INTRAVENOUS
  Filled 2022-09-17 (×6): qty 1

## 2022-09-17 MED ORDER — MORPHINE SULFATE (PF) 2 MG/ML IV SOLN
1.0000 mg | Freq: Once | INTRAVENOUS | Status: AC
  Administered 2022-09-17: 1 mg via INTRAVENOUS
  Filled 2022-09-17: qty 1

## 2022-09-17 MED ORDER — MORPHINE SULFATE (PF) 2 MG/ML IV SOLN
2.0000 mg | INTRAVENOUS | Status: DC | PRN
  Administered 2022-09-17: 2 mg via INTRAVENOUS
  Filled 2022-09-17: qty 1

## 2022-09-17 MED ORDER — MORPHINE SULFATE (PF) 2 MG/ML IV SOLN
2.0000 mg | Freq: Once | INTRAVENOUS | Status: AC
  Administered 2022-09-17: 2 mg via INTRAVENOUS

## 2022-09-17 MED ORDER — OXYCODONE HCL 5 MG PO TABS
5.0000 mg | ORAL_TABLET | ORAL | Status: DC | PRN
  Administered 2022-09-17 – 2022-09-27 (×3): 5 mg via ORAL
  Filled 2022-09-17 (×3): qty 1

## 2022-09-17 NOTE — Progress Notes (Addendum)
Patient ID: Ryan Garner, male   DOB: 1946-06-18, 77 y.o.   MRN: [id number]     Advanced Heart Failure Rounding Note  PCP-Cardiologist: Dorris Carnes, MD   Subjective:   Dobutamine 2.5 for RV support with co-ox 82%.  Had BM on Sat.   Yesterday had lots of coughing. Developed acute abdominal pain overnight. Pain 10/10 LUQ. Given 1 mg morpine with no improvement.    Objective:   Weight Range: 134.6 kg Body mass index is 45.12 kg/m.   Vital Signs:   Temp:  [98.1 F (36.7 C)-98.6 F (37 C)] 98.2 F (36.8 C) (02/26 0744) Pulse Rate:  [78-101] 79 (02/26 0436) Resp:  [14-18] 16 (02/26 0744) BP: (92-114)/(57-74) 92/57 (02/26 0436) SpO2:  [93 %-99 %] 94 % (02/26 0436) Weight:  [134.6 kg] 134.6 kg (02/26 0436) Last BM Date : 09/15/22  Weight change: Filed Weights   09/15/22 0331 09/16/22 0109 09/17/22 0436  Weight: (!) 136.4 kg (!) 136.9 kg 134.6 kg    Intake/Output:   Intake/Output Summary (Last 24 hours) at 09/17/2022 0746 Last data filed at 09/17/2022 0745 Gross per 24 hour  Intake 290 ml  Output 5625 ml  Net -5335 ml   CVP 11-12  Physical Exam  General:  Appears weak.  Grimacing. No resp difficulty HEENT: normal Neck: supple. JVP 11-12  Carotids 2+ bilat; no bruits. No lymphadenopathy or thryomegaly appreciated. Cor: PMI nondisplaced. Irregular rate & rhythm. No rubs, gallops or murmurs. Lungs: clear Abdomen: soft, LUQ tender, nondistended. No hepatosplenomegaly. No bruits or masses. Good bowel sounds. Extremities: no cyanosis, clubbing, rash, edema. RUE PICC  Neuro: alert & orientedx3, cranial nerves grossly intact. moves all 4 extremities w/o difficulty. Affect flat  Telemetry    Afib 60-70s personally checked.   EKG    No new EKG to review  Labs    CBC Recent Labs    09/16/22 0225 09/17/22 0445  WBC 7.4 6.4  HGB 10.7* 9.7*  HCT 31.7* 28.4*  MCV 87.8 86.9  PLT 221 [id number]   Basic Metabolic Panel Recent Labs    09/16/22 0225 09/17/22 0445  NA 129*  129*  K 3.8 3.6  CL 94* 90*  CO2 25 29  GLUCOSE 127* 115*  BUN 48* 47*  CREATININE 1.49* 1.39*  CALCIUM 9.2 9.0   Liver Function Tests No results for input(s): "AST", "ALT", "ALKPHOS", "BILITOT", "PROT", "ALBUMIN" in the last 72 hours.  No results for input(s): "LIPASE", "AMYLASE" in the last 72 hours. Cardiac Enzymes No results for input(s): "CKTOTAL", "CKMB", "CKMBINDEX", "TROPONINI" in the last 72 hours.  BNP: BNP (last 3 results) Recent Labs    11/20/21 1409 04/06/22 1253 09/12/22 2101  BNP 485.0* 682.5* 577.3*    ProBNP (last 3 results) Recent Labs    08/15/22 1049 09/04/22 1404 09/11/22 1530  PROBNP 5,315* 11,525* 8,444*    Other results:   Imaging   No results found.  Medications:     Scheduled Medications:  atorvastatin  80 mg Oral Daily   Chlorhexidine Gluconate Cloth  6 each Topical Daily   potassium chloride  40 mEq Oral BID   sodium chloride flush  3 mL Intravenous Q12H   umeclidinium bromide  1 puff Inhalation Daily    Infusions:  sodium chloride     DOBUTamine 2.5 mcg/kg/min (09/15/22 0347)   furosemide (LASIX) 200 mg in dextrose 5 % 100 mL (2 mg/mL) infusion 20 mg/hr (09/16/22 1437)   heparin 1,250 Units/hr (09/16/22 1438)    PRN Medications:  sodium chloride, acetaminophen, albuterol, guaiFENesin-dextromethorphan, magnesium hydroxide, ondansetron (ZOFRAN) IV, sodium chloride flush  Patient Profile   Ryan Garner is 77 year old with a history of CAD, STEMI 2021, DES to LCx, permanent A fib, severe Ryan, HTN, HLD, sleep apnea, Ryan, chronic lymphedema, and HFpEF.    Admitted with A/C HFpEF.   Assessment/Plan   1. Acute on chronic HFpEF: With prominent RV dysfunction.  Echo this admission shows EF 50-55%, D-shaped septum, moderate-severe RV enlargement with moderate RV dysfunction, PASP 69, severe biatrial enlargement, moderate AS with mean gradient 21 mmHg/AVA 1.2 cm^2, IVC dilated.  Creatinine stable at 1.4. CVP 11-12 today.  He remains  on dobutamine 2.5 for RV support.  CO-OX 74%. - Hold lasix for now until acute abd sorted out.    - LHC/RHC planned when better diuresed and creatinine stabilized.  2. Mitral regurgitation: Severe Ryan by echo.  Mechanism uncertain, may be atrial functional Ryan with severe LAE. Suspect this is driving pulmonary hypertension and RV failure.  - TEE when he is adequately diuresed to assess mitral valve.  - May be candidate for Mitraclip but will need need significant optimization.  3. AKI on CKD 3: Suspect cardiorenal syndrome. Creatinine stable at 1.4   - Continue dobutamine as above.  4. Atrial fibrillation: Permanent, has been seen by EP in the past. Very unlikely to successfully cardiovert and stay in NSR.  -Hold heparin drip with acute abdominal pain. Concern for hematoma.   5. CAD: No chest pain.  Had DES to mLCx in 10/21.   - Consider coronary angiography ?pre-Mitraclip if creatinine comes down.  - Continue atorvastatin.  6. LE wounds: Followed as outpatient by Dr. Sharol Given.  ABIs normal this admission.  - Wound care 7. Acute Abdominal Pain, possible retroperitoneal bleed.  -Pain  10/10 -NPO. Give 2 mg Morphine now.  -Stat CT abd/pelvis.  -Abdominal binder.  -Hold heparin.  - Obtain type and screen.  8. Anemia  Hgb down 1 gram. Repeat CBC now. Obtain type and screen.    Taken to CT . Large rectus sheath hematoma 15.0 x 7.4 x 14.7 cm . He is off heparin. CBC pending. Bedrest for now.    Amy Clegg NP-C  09/17/2022 7:46 AM  Agree with above.   Remains on IV lasix, DBA and heparin. Developed progressive pain and LUQ swelling overnight. Says he feels like he has a new hernia. Co-ox ok. CVP 11-12  General:  Elderly weak appearing. No resp difficulty HEENT: normal Neck: supple. nJVP to jaw . Carotids 2+ bilat; no bruits. No lymphadenopathy or thryomegaly appreciated. Cor: PMI nondisplaced. Irregular rate & rhythm. Lungs: clear Abdomen: obese. Large hematoma RUQ  soft, + tender No  hepatosplenomegaly. No bruits or masses. Good bowel sounds. Extremities: no cyanosis, clubbing, rash, tr edema Neuro: alert & orientedx3, cranial nerves grossly intact. moves all 4 extremities w/o difficulty. Affect pleasant  Heparin and IV lasix stopped/ Patient sent for stat CT A/P shows large left rectus sheath hematoma.   Will continue to hold [facility] and lasix. Follow CBC. Keep active T&S. Place ab binder. Low threshold to upgrade level of care if becoming unstable.   CRITICAL CARE Performed by: Glori Bickers  Total critical care time: 45 minutes  Critical care time was exclusive of separately billable procedures and treating other patients.  Critical care was necessary to treat or prevent imminent or life-threatening deterioration.  Critical care was time spent personally by me (independent of midlevel providers or residents) on the following activities:  development of treatment plan with patient and/or surrogate as well as nursing, discussions with consultants, evaluation of patient's response to treatment, examination of patient, obtaining history from patient or surrogate, ordering and performing treatments and interventions, ordering and review of laboratory studies, ordering and review of radiographic studies, pulse oximetry and re-evaluation of patient's condition.  Glori Bickers, MD  9:58 AM

## 2022-09-17 NOTE — Progress Notes (Signed)
OT Cancellation Note  Patient Details Name: Ryan Garner MRN: [id number] DOB: 1946/05/19   Cancelled Treatment:    Reason Eval/Treat Not Completed: Active bedrest order due to recent abdominal CT with possible bleed.  [place] Office [phone]    Almon Register 09/17/2022, 10:05 AM

## 2022-09-17 NOTE — Progress Notes (Signed)
ANTICOAGULATION CONSULT NOTE  Pharmacy Consult for Heparin Indication: atrial fibrillation  Allergies  Allergen Reactions   Flecainide     Per patient dizziness, shortness of breath, blurred vision   Metoprolol Tartrate Itching   Rofecoxib Rash and Other (See Comments)    (Vioxx)    Patient Measurements: Height: '5\' 8"'$  (172.7 cm) Weight: 134.6 kg (296 lb 11.8 oz) IBW/kg (Calculated) : 68.4 Heparin Dosing Weight: 100 kg  Vital Signs: Temp: 98.2 F (36.8 C) (02/26 0744) Temp Source: Oral (02/26 0744) BP: 93/58 (02/26 0813) Pulse Rate: 79 (02/26 0436)  Labs: Recent Labs    09/15/22 0409 09/15/22 1024 09/15/22 2017 09/16/22 0225 09/16/22 0500 09/17/22 0445  HGB 11.3*  --   --  10.7*  --  9.7*  HCT 33.1*  --   --  31.7*  --  28.4*  PLT 213  --   --  221  --  194  APTT  --  49* 68* 65*  --   --   HEPARINUNFRC  --  0.81*  --   --  0.67 0.40  CREATININE 1.48*  --   --  1.49*  --  1.39*     Estimated Creatinine Clearance: 58.8 mL/min (A) (by C-G formula based on SCr of 1.39 mg/dL (H)).   Medical History: Past Medical History:  Diagnosis Date   A-fib [facility])    Acute on chronic diastolic CHF (congestive heart failure) ([place]) 07/21/2020   Coronary artery disease    Diabetes mellitus without complication (HCC)    Dyspnea    Dysrhythmia    A-fib   Hyperlipidemia    Hypertension    Metabolic syndrome    Prediabetes    Sleep apnea    STEMI (ST elevation myocardial infarction) ([place])     Medications:  Medications Prior to Admission  Medication Sig Dispense Refill Last Dose   albuterol (VENTOLIN HFA) 108 (90 Base) MCG/ACT inhaler Inhale 2 puffs into the lungs every 6 (six) hours as needed for wheezing or shortness of breath. 8 g 0 Past Month   apixaban (ELIQUIS) 5 MG TABS tablet Take 1 tablet (5 mg total) by mouth 2 (two) times daily. 180 tablet 1 09/12/2022 at 0900   ascorbic acid (VITAMIN C) 1000 MG tablet Take 1 tablet (1,000 mg total) by mouth daily. 60 tablet 1  09/12/2022   atorvastatin (LIPITOR) 80 MG tablet Take 1 tablet by mouth once daily 90 tablet 3 09/12/2022   clopidogrel (PLAVIX) 75 MG tablet Take 1 tablet (75 mg total) by mouth daily with breakfast. 90 tablet 2 09/12/2022   cyanocobalamin (VITAMIN B12) 1000 MCG tablet Take 1,000 mcg by mouth daily.   09/12/2022   furosemide (LASIX) 80 MG tablet Take 1.5 tablets (120 mg total) by mouth 2 (two) times daily.   09/12/2022   metoprolol tartrate (LOPRESSOR) 25 MG tablet Take 1 tablet (25 mg total) by mouth 2 (two) times daily. 60 tablet 0 09/12/2022 at 0800   Multiple Vitamin (MULTIVITAMIN WITH MINERALS) TABS tablet Take 1 tablet by mouth daily.   09/12/2022   nitroGLYCERIN (NITROSTAT) 0.4 MG SL tablet Place 1 tablet (0.4 mg total) under the tongue every 5 (five) minutes x 3 doses as needed for chest pain. 25 tablet 2 Past Month   nutrition supplement, JUVEN, (JUVEN) PACK Take 1 packet by mouth 2 (two) times daily between meals. 60 packet 1 09/12/2022   nystatin (MYCOSTATIN/NYSTOP) powder Apply 1 Application topically 3 (three) times daily. 15 g 0 09/12/2022  potassium chloride SA (KLOR-CON M) 20 MEQ tablet Take 2 tablets (40 mEq total) by mouth daily.   09/12/2022   Tiotropium Bromide Monohydrate (SPIRIVA RESPIMAT) 1.25 MCG/ACT AERS Inhale 2 puffs into the lungs daily. 4 g 3 09/12/2022   zinc sulfate 220 (50 Zn) MG capsule Take 1 capsule (220 mg total) by mouth daily. 60 capsule 0 09/12/2022   fluconazole (DIFLUCAN) 150 MG tablet Take 1 tablet (150 mg total) by mouth once a week. (Patient not taking: Reported on 09/12/2022) 2 tablet 0 Completed Course   sulfamethoxazole-trimethoprim (BACTRIM DS) 800-160 MG tablet Take 1 tablet by mouth 2 (two) times daily. (Patient not taking: Reported on 09/12/2022) 20 tablet 0 Completed Course    Assessment: 77 y.o. M presents with permanent afib. Pt on apixaban PTA for afib - last dose 2/21 0900. To hold apixaban and start heparin bridge. Apixaban will be affecting heparin  level so will utilize aPTT for monitoring until levels correlate.   Heparin level came back therapeutic at 0.4, on 1250 units/hr. Hgb 9.7, plt 194. Patient complaining of severe abdominal pain - CT ordered finding L rectus sheath hematoma.   Goal of Therapy:  Heparin level 0.3-0.7 units/ml aPTT 66-102 seconds Monitor platelets by anticoagulation protocol: Yes   Plan:  Hold on all anticoagulation for now - heparin consult removed  Antonietta Jewel, PharmD, [place] Pharmacist  Phone: [phone] 09/17/2022 9:52 AM  Please check AMION for all [place] phone numbers After 10:00 PM, call [place] [phone]

## 2022-09-17 NOTE — Progress Notes (Addendum)
Patient complains of 10/10 left lower abdominal pain, guarding and tender at site. Patient relayed that it started at 2200 overnight with no relief with prn medications that night shift RN provided. RN paged and notified CHF team.   CHF team orders to make the patient NPO, stat CT ordered. CHF team marked abdominal site. RN pushed '2mg'$  IV morphine to address pain.

## 2022-09-17 NOTE — Progress Notes (Signed)
  Acute Abd--> Rectus Sheath Hematoma.   Hemoglobin stable.  Placing abdominal binder.    Tashana Haberl NP-C  12:02 PM

## 2022-09-17 NOTE — Progress Notes (Signed)
Orthopedic Tech Progress Note Patient Details:  Ryan Garner Nov 18, 1945 [id number]  Ortho Devices Type of Ortho Device: Ace wrap, Unna boot Ortho Device/Splint Location: BLE Ortho Device/Splint Interventions: Ordered, Application, Adjustment   Post Interventions Patient Tolerated: Well Instructions Provided: Care of device  [name] 09/17/2022, 3:17 PM

## 2022-09-17 NOTE — Consult Note (Signed)
   [facility] Inpatient Consult   09/17/2022  Ryan Garner 1946/06/24 [id number]  [place] Organization [ACO] Patient: Medicare ACO REACH  Primary Care Provider:  [name], [name], MD with [place] which is listed to provide the transition of care follow up   Patient screened for hospitalization with noted medium risk score for unplanned readmission risk to assess for potential [place] Management service needs for post hospital transition for care coordination.  Review of patient's electronic medical record reveals patient had been active with [facility] LCSW in the past.  Met with patient at the bedside, resting in bed  states "not feeling that good" and son just left.  Explained briefly that of following for progress and he nods his head.  Left a 24 hour nurse advise line magnet and appointment reminder card.  Son returned and explained ongoing follow up for community care needs.  Both were appreciative.  Plan:  Continue to follow progress and disposition to assess for post hospital community care coordination/management needs.  Referral request for community care coordination: following  Of note, [place] does not replace or interfere with any arrangements made by the Inpatient Transition of Care team.  For questions contact:   Natividad Brood, RN BSN [place]  [phone] business mobile phone Toll free office [phone]  *[place]  [phone] Fax number: [phone] [email or web address] '

## 2022-09-18 ENCOUNTER — Inpatient Hospital Stay (HOSPITAL_COMMUNITY): Payer: Medicare Other

## 2022-09-18 DIAGNOSIS — R0602 Shortness of breath: Secondary | ICD-10-CM | POA: Diagnosis not present

## 2022-09-18 DIAGNOSIS — J9811 Atelectasis: Secondary | ICD-10-CM | POA: Diagnosis not present

## 2022-09-18 DIAGNOSIS — I5033 Acute on chronic diastolic (congestive) heart failure: Secondary | ICD-10-CM | POA: Diagnosis not present

## 2022-09-18 DIAGNOSIS — J90 Pleural effusion, not elsewhere classified: Secondary | ICD-10-CM | POA: Diagnosis not present

## 2022-09-18 LAB — CBC
HCT: 25.8 % — ABNORMAL LOW (ref 39.0–52.0)
HCT: 24.7 % — ABNORMAL LOW (ref 39.0–52.0)
Hemoglobin: 8.6 g/dL — ABNORMAL LOW (ref 13.0–17.0)
Hemoglobin: 8.4 g/dL — ABNORMAL LOW (ref 13.0–17.0)
MCH: 29.7 pg (ref 26.0–34.0)
MCH: 29.9 pg (ref 26.0–34.0)
MCHC: 33.3 g/dL (ref 30.0–36.0)
MCHC: 34 g/dL (ref 30.0–36.0)
MCV: 89 fL (ref 80.0–100.0)
MCV: 87.9 fL (ref 80.0–100.0)
Platelets: 193 10*3/uL (ref 150–400)
Platelets: 170 10*3/uL (ref 150–400)
RBC: 2.9 MIL/uL — ABNORMAL LOW (ref 4.22–5.81)
RBC: 2.81 MIL/uL — ABNORMAL LOW (ref 4.22–5.81)
RDW: 16.2 % — ABNORMAL HIGH (ref 11.5–15.5)
RDW: 16.1 % — ABNORMAL HIGH (ref 11.5–15.5)
WBC: 7.5 10*3/uL (ref 4.0–10.5)
WBC: 7.4 10*3/uL (ref 4.0–10.5)
nRBC: 0 % (ref 0.0–0.2)
nRBC: 0 % (ref 0.0–0.2)

## 2022-09-18 LAB — BASIC METABOLIC PANEL
Anion gap: 16 — ABNORMAL HIGH (ref 5–15)
BUN: 45 mg/dL — ABNORMAL HIGH (ref 8–23)
CO2: 27 mmol/L (ref 22–32)
Calcium: 9.6 mg/dL (ref 8.9–10.3)
Chloride: 89 mmol/L — ABNORMAL LOW (ref 98–111)
Creatinine, Ser: 1.26 mg/dL — ABNORMAL HIGH (ref 0.61–1.24)
GFR, Estimated: 58 mL/min — ABNORMAL LOW (ref >60)
Glucose, Bld: 126 mg/dL — ABNORMAL HIGH (ref 70–99)
Potassium: 3.1 mmol/L — ABNORMAL LOW (ref 3.5–5.1)
Sodium: 132 mmol/L — ABNORMAL LOW (ref 135–145)

## 2022-09-18 LAB — COOXEMETRY PANEL
Carboxyhemoglobin: 3.1 % — ABNORMAL HIGH (ref 0.5–1.5)
Methemoglobin: <0.7 % (ref 0.0–1.5)
O2 Saturation: 69.5 %
Total hemoglobin: 9.2 g/dL — ABNORMAL LOW (ref 12.0–16.0)

## 2022-09-18 LAB — MAGNESIUM: Magnesium: 1.9 mg/dL (ref 1.7–2.4)

## 2022-09-18 MED ORDER — FUROSEMIDE 10 MG/ML IJ SOLN
120.0000 mg | Freq: Once | INTRAVENOUS | Status: DC
  Filled 2022-09-18: qty 12

## 2022-09-18 MED ORDER — FUROSEMIDE 10 MG/ML IJ SOLN
120.0000 mg | INTRAVENOUS | Status: AC
  Administered 2022-09-18: 120 mg via INTRAVENOUS
  Filled 2022-09-18: qty 10

## 2022-09-18 MED ORDER — MAGNESIUM SULFATE 2 GM/50ML IV SOLN
2.0000 g | Freq: Once | INTRAVENOUS | Status: AC
  Administered 2022-09-18: 2 g via INTRAVENOUS
  Filled 2022-09-18: qty 50

## 2022-09-18 MED ORDER — MORPHINE SULFATE (PF) 2 MG/ML IV SOLN
1.0000 mg | Freq: Once | INTRAVENOUS | Status: AC
  Administered 2022-09-18: 1 mg via INTRAVENOUS
  Filled 2022-09-18: qty 1

## 2022-09-18 MED ORDER — POTASSIUM CHLORIDE CRYS ER 20 MEQ PO TBCR
40.0000 meq | EXTENDED_RELEASE_TABLET | ORAL | Status: AC
  Administered 2022-09-18 (×2): 40 meq via ORAL
  Filled 2022-09-18 (×2): qty 2

## 2022-09-18 NOTE — Progress Notes (Signed)
Rapid Response Note  Called to bedside for low manual BP and wheezing. Patient mentating well, A&O x4, no new complaints, states breathing is like everyday, abdominal pain only when he coughs. CHF NP to bedside to assess pt. Labs sent.   91/58 (69) HR 92 O2 99% RA RR 18  Ryan Garner  Rapid Response RN

## 2022-09-18 NOTE — Progress Notes (Signed)
Paged about patient being hypotensive with SBP in 80s.   At bedside BP 91/58 MAP 69. Patient asymptomatic. CVP 16 (same as this am)  Lasix and hep gtt stopped yesterday.   Continue DBA 2.5. Will check STAT CBC with rectus sheath hematoma. No new pain.   Forestine Na, AGACNP-BC  Advanced Heart Failure Team  09/18/22

## 2022-09-18 NOTE — Progress Notes (Addendum)
Patient ID: Ryan Garner, male   DOB: 1946-02-16, 77 y.o.   MRN: [id number]     Advanced Heart Failure Rounding Note  PCP-Cardiologist: Dorris Carnes, MD   Subjective:    2/26 acute abdominal pain>>CT A/P shows large left rectus sheath hematoma. Heparin d/ced  Hgb 10.7>>9.7>>8.6. SBPs 90s-110s.   Remains on DBA 2.5 for RV support. Co-ox 70%. Off diuretics. Wt stable past 24 hrs. CVP 15-16. SCr continues to trend dow, 1.26 today. K 3.1   Feels better today. Abdominal pain level down to 2/10. Wearing binder. Denies resting dyspnea.     Objective:   Weight Range: 127.3 kg Body mass index is 42.67 kg/m.   Vital Signs:   Temp:  [97.3 F (36.3 C)-97.8 F (36.6 C)] 97.8 F (36.6 C) (02/27 0424) Pulse Rate:  [86-92] 89 (02/27 0745) Resp:  [16-20] 18 (02/27 0745) BP: (94-116)/(58-93) 99/58 (02/27 0745) SpO2:  [90 %-100 %] 97 % (02/27 0802) Weight:  [127.3 kg] 127.3 kg (02/27 0500) Last BM Date : 09/15/22  Weight change: Filed Weights   09/17/22 0436 09/18/22 0000 09/18/22 0500  Weight: 134.6 kg 127.3 kg 127.3 kg    Intake/Output:   Intake/Output Summary (Last 24 hours) at 09/18/2022 [id number] Last data filed at 09/18/2022 0900 Gross per 24 hour  Intake 1199.85 ml  Output 1650 ml  Net -450.15 ml    Physical Exam   CVP 15-16  General:  elderly male, obese No respiratory difficulty HEENT: normal Neck: supple. JVP 15 cm. Carotids 2+ bilat; no bruits. No lymphadenopathy or thyromegaly appreciated. Cor: PMI nondisplaced. Irregularly irregular rhythm and rate. No rubs, gallops or murmurs. Lungs: clear Abdomen: soft, nontender, nondistended. No hepatosplenomegaly. No bruits. Good bowel sounds. + abdominal binder  Extremities: no cyanosis, clubbing, rash, 2-3+ b/l edema up to thighs  Neuro: alert & oriented x 3, cranial nerves grossly intact. moves all 4 extremities w/o difficulty. Affect pleasant.   Telemetry   Afib 90s personally checked.   EKG    No new EKG to  review  Labs    CBC Recent Labs    09/17/22 1530 09/18/22 0503  WBC 5.9 7.5  HGB 9.0* 8.6*  HCT 27.4* 25.8*  MCV 89.0 89.0  PLT 183 0000000   Basic Metabolic Panel Recent Labs    09/17/22 0445 09/18/22 0503  NA 129* 132*  K 3.6 3.1*  CL 90* 89*  CO2 29 27  GLUCOSE 115* 126*  BUN 47* 45*  CREATININE 1.39* 1.26*  CALCIUM 9.0 9.6   Liver Function Tests No results for input(s): "AST", "ALT", "ALKPHOS", "BILITOT", "PROT", "ALBUMIN" in the last 72 hours.  No results for input(s): "LIPASE", "AMYLASE" in the last 72 hours. Cardiac Enzymes No results for input(s): "CKTOTAL", "CKMB", "CKMBINDEX", "TROPONINI" in the last 72 hours.  BNP: BNP (last 3 results) Recent Labs    11/20/21 1409 04/06/22 1253 09/12/22 2101  BNP 485.0* 682.5* 577.3*    ProBNP (last 3 results) Recent Labs    08/15/22 1049 09/04/22 1404 09/11/22 1530  PROBNP 5,315* 11,525* 8,444*    Other results:   Imaging   No results found.  Medications:     Scheduled Medications:  atorvastatin  80 mg Oral Daily   Chlorhexidine Gluconate Cloth  6 each Topical Daily   potassium chloride  40 mEq Oral Q2H   sodium chloride flush  3 mL Intravenous Q12H   umeclidinium bromide  1 puff Inhalation Daily    Infusions:  sodium chloride  DOBUTamine 2.5 mcg/kg/min (09/18/22 0405)    PRN Medications: sodium chloride, acetaminophen, albuterol, guaiFENesin-dextromethorphan, magnesium hydroxide, morphine injection, ondansetron (ZOFRAN) IV, oxyCODONE, sodium chloride flush  Patient Profile   Mr Lauro is 77 year old with a history of CAD, STEMI 2021, DES to LCx, permanent A fib, severe MR, HTN, HLD, sleep apnea, MR, chronic lymphedema, and HFpEF.    Admitted with A/C HFpEF.   Assessment/Plan   1. Acute on chronic HFpEF: With prominent RV dysfunction.  Echo this admission shows EF 50-55%, D-shaped septum, moderate-severe RV enlargement with moderate RV dysfunction, PASP 69, severe biatrial  enlargement, moderate AS with mean gradient 21 mmHg/AVA 1.2 cm^2, IVC dilated. Diuretics held yesterday w/ ABLA from rectus sheath hematoma. Appears to be stabilizing. He remains on dobutamine 2.5 for RV support.  CO-OX 70%. CVP 15-16 - Hold lasix again today w/ anemia and soft BP  - Continue DBA for RV support  2. Mitral regurgitation: Severe MR by echo.  Mechanism uncertain, may be atrial functional MR with severe LAE. Suspect this is driving pulmonary hypertension and RV failure.  - TEE when he is adequately diuresed to assess mitral valve.  - May be candidate for Mitraclip but will need need significant optimization.  3. AKI on CKD 3: Suspect cardiorenal syndrome. Creatinine stable at 1.26   - Continue dobutamine as above.  4. Atrial fibrillation: Permanent, has been seen by EP in the past. Very unlikely to successfully cardiovert and stay in NSR.  -Hold heparin drip w/ rectus sheath hematoma   5. CAD: No chest pain.  Had DES to mLCx in 10/21.   - Consider coronary angiography ?pre-Mitraclip once rectus sheath hematoma resolves.  - Continue atorvastatin.  6. LE wounds: Followed as outpatient by Dr. Sharol Given.  ABIs normal this admission.  - Wound care 7. Left Rectus Sheath Hematoma: 15.0 x 7.4 x 14.7 cm hematoma on CT 2/26.  -Hold heparin.  -Hgb 10.7>>9.7>>8.6. if further drop, may need transfusion  -abdominal binder  8. Anemia  - in setting of rectus sheath hematoma.  - Hgb 10.7>>9.7>>8.6. if further drop, may need transfusion  9. Hypokalemia: K 3.1 - supp K and check Mg    Nelida Gores   09/18/2022 9:22 AM  Patient seen and examined with the above-signed Advanced Practice Provider and/or Housestaff. I personally reviewed laboratory data, imaging studies and relevant notes. I independently examined the patient and formulated the important aspects of the plan. I have edited the note to reflect any of my changes or salient points. I have personally discussed the plan with the  patient and/or family.  Abdomen feels much better with ab binder. Site soft. Hgb stabilizing.   This afternoon developed acute worsening of SOB. CVP 16.  Given IV lasix and bipap started  Remains in AF. On DBA.   General:  Weak appearing. Tachypneic HEENT: normal Neck: supple. JVP to ear  Carotids 2+ bilat; no bruits. No lymphadenopathy or thryomegaly appreciated. Cor: PMI nondisplaced. Regular rate & rhythm. No rubs, gallops or murmurs. Lungs: + crackles Abdomen: obese soft, minimally tender, nondistended. Hematoma much improved. No hepatosplenomegaly. No bruits or masses. Good bowel sounds. Extremities: no cyanosis, clubbing, rash, 1+ edema Neuro: alert & orientedx3, cranial nerves grossly intact. moves all 4 extremities w/o difficulty. Affect pleasant  He appears to be in acute HF this evening. Will give 120 IV lasix and start bipap. I discussed code status with him again and he is clear that he would not want to be intubated if  respiratory status worsening. (Would switch to comfort care if worse).   Continue to hold [facility]. If hgb stable can start low-dose enox for DVT prophylaxis.   I am growing increasingly concerned about his prognosis.   Glori Bickers, MD  10:35 PM

## 2022-09-18 NOTE — Progress Notes (Signed)
PT Cancellation Note  Patient Details Name: DONIS MCAULEY MRN: [id number] DOB: 07/06/1946   Cancelled Treatment:    Reason Eval/Treat Not Completed: Medical issues which prohibited therapy (Per RN, pt not appropriate for therapy today)   Viann Shove 09/18/2022, 12:14 PM

## 2022-09-18 NOTE — Progress Notes (Signed)
Patient oxygen saturation is trending down to the 80-82s on RA. RN placed 4L [place] to maintain oxygen sats above 88. Patient resting comfortably, asked RN for expectorant. Spoke to [place] NP on the unit.

## 2022-09-18 NOTE — Progress Notes (Signed)
   09/18/22 1113  Vitals  BP (!) 86/60  BP Location Left Arm  BP Method Manual  Patient Position (if appropriate) Lying  Pulse Rate 87  Pulse Rate Source Monitor  ECG Heart Rate 88  Resp 16  MEWS COLOR  MEWS Score Color Green  Oxygen Therapy  SpO2 97 %  Invasive Hemodynamic Monitoring  CVP (mmHg) 3 mmHg  MEWS Score  MEWS Temp 0  MEWS Systolic 1  MEWS Pulse 0  MEWS RR 0  MEWS LOC 0  MEWS Score 1   RN obtained 1200 VS and SBP 86-88 automatic. RN obtained manual of 86/60. Patient asymptomatic lying in bed, resting. Patient is having no pain. RN notified CHF team and Rapid RN to round on patient. STAT CBC lab orders and STAT CBC lab obtained and sent down to lab.

## 2022-09-18 NOTE — Progress Notes (Signed)
RN rounded on patient, patient is asleep on BIPAP. No signs of distress. Patient continues to be NPO. Patient had 153m of urine output, documented.

## 2022-09-19 DIAGNOSIS — I5033 Acute on chronic diastolic (congestive) heart failure: Secondary | ICD-10-CM | POA: Diagnosis not present

## 2022-09-19 LAB — CBC
HCT: 23.8 % — ABNORMAL LOW (ref 39.0–52.0)
Hemoglobin: 8 g/dL — ABNORMAL LOW (ref 13.0–17.0)
MCH: 29.5 pg (ref 26.0–34.0)
MCHC: 33.6 g/dL (ref 30.0–36.0)
MCV: 87.8 fL (ref 80.0–100.0)
Platelets: 192 10*3/uL (ref 150–400)
RBC: 2.71 MIL/uL — ABNORMAL LOW (ref 4.22–5.81)
RDW: 16.4 % — ABNORMAL HIGH (ref 11.5–15.5)
WBC: 7.5 10*3/uL (ref 4.0–10.5)
nRBC: 0 % (ref 0.0–0.2)

## 2022-09-19 LAB — BASIC METABOLIC PANEL
Anion gap: 11 (ref 5–15)
Anion gap: 8 (ref 5–15)
BUN: 46 mg/dL — ABNORMAL HIGH (ref 8–23)
BUN: 45 mg/dL — ABNORMAL HIGH (ref 8–23)
CO2: 28 mmol/L (ref 22–32)
CO2: 30 mmol/L (ref 22–32)
Calcium: 9.2 mg/dL (ref 8.9–10.3)
Calcium: 9.2 mg/dL (ref 8.9–10.3)
Chloride: 93 mmol/L — ABNORMAL LOW (ref 98–111)
Chloride: 93 mmol/L — ABNORMAL LOW (ref 98–111)
Creatinine, Ser: 1.31 mg/dL — ABNORMAL HIGH (ref 0.61–1.24)
Creatinine, Ser: 1.38 mg/dL — ABNORMAL HIGH (ref 0.61–1.24)
GFR, Estimated: 56 mL/min — ABNORMAL LOW (ref >60)
GFR, Estimated: 52 mL/min — ABNORMAL LOW (ref >60)
Glucose, Bld: 107 mg/dL — ABNORMAL HIGH (ref 70–99)
Glucose, Bld: 171 mg/dL — ABNORMAL HIGH (ref 70–99)
Potassium: 3.9 mmol/L (ref 3.5–5.1)
Potassium: 3.6 mmol/L (ref 3.5–5.1)
Sodium: 132 mmol/L — ABNORMAL LOW (ref 135–145)
Sodium: 131 mmol/L — ABNORMAL LOW (ref 135–145)

## 2022-09-19 LAB — COOXEMETRY PANEL
Carboxyhemoglobin: 3.1 % — ABNORMAL HIGH (ref 0.5–1.5)
Methemoglobin: <0.7 % (ref 0.0–1.5)
O2 Saturation: 73.3 %
Total hemoglobin: 8.4 g/dL — ABNORMAL LOW (ref 12.0–16.0)

## 2022-09-19 MED ORDER — POTASSIUM CHLORIDE CRYS ER 20 MEQ PO TBCR
40.0000 meq | EXTENDED_RELEASE_TABLET | Freq: Once | ORAL | Status: AC
  Administered 2022-09-19: 40 meq via ORAL
  Filled 2022-09-19: qty 2

## 2022-09-19 MED ORDER — FUROSEMIDE 10 MG/ML IJ SOLN
120.0000 mg | INTRAVENOUS | Status: AC
  Administered 2022-09-19: 120 mg via INTRAVENOUS
  Filled 2022-09-19: qty 10

## 2022-09-19 MED ORDER — FUROSEMIDE 10 MG/ML IJ SOLN
20.0000 mg/h | INTRAVENOUS | Status: DC
  Administered 2022-09-19 – 2022-09-22 (×5): 15 mg/h via INTRAVENOUS
  Administered 2022-09-23: 20 mg/h via INTRAVENOUS
  Administered 2022-09-23: 15 mg/h via INTRAVENOUS
  Administered 2022-09-24 – 2022-09-25 (×5): 20 mg/h via INTRAVENOUS
  Filled 2022-09-19 (×14): qty 20

## 2022-09-19 MED ORDER — ENOXAPARIN SODIUM 30 MG/0.3ML IJ SOSY
30.0000 mg | PREFILLED_SYRINGE | INTRAMUSCULAR | Status: DC
  Administered 2022-09-19 – 2022-09-20 (×2): 30 mg via SUBCUTANEOUS
  Filled 2022-09-19 (×2): qty 0.3

## 2022-09-19 NOTE — Progress Notes (Signed)
Patient rested well most of the night. BiPAP continued through out the night. Patient did not want to switch to CPAP or nasal cannula. Patient refused pain medicine multiple times.

## 2022-09-19 NOTE — Progress Notes (Addendum)
Patient ID: Ryan Garner, male   DOB: [birth year], 77 y.o.   MRN: [id number]     Advanced Heart Failure Rounding Note  PCP-Cardiologist: Dorris Carnes, MD   Subjective:    2/26 acute abdominal pain>>CT A/P shows large left rectus sheath hematoma. Heparin d/ced. Lasix gtt stopped.  2/27 acute HF w/ acute worsening of dyspnea>>120 mg IV Lasix + BiPAP  1.4L in UOP yesterday but only net negative 380 cc. CVP currently not working correctly but remains volume overloaded on exam.   Hgb 10.7>>9.7>>8.6>>8.0. SBPs 90s-110s.   Remains on DBA 2.5 for RV support. Co-ox 73%.   Abdominal pain improved but overall still feels poorly w/ weakness and lethargy. Still requiring BiPAP. Son present at bedside.    Objective:   Weight Range: 127.3 kg Body mass index is 42.67 kg/m.   Vital Signs:   Temp:  [97.3 F (36.3 C)-97.8 F (36.6 C)] 97.8 F (36.6 C) (02/27 0424) Pulse Rate:  [86-92] 89 (02/27 0745) Resp:  [16-20] 18 (02/27 0745) BP: (94-116)/(58-93) 99/58 (02/27 0745) SpO2:  [90 %-100 %] 97 % (02/27 0802) Weight:  [127.3 kg] 127.3 kg (02/27 0500) Last BM Date : 09/15/22  Weight change: Filed Weights   09/17/22 0436 09/18/22 0000 09/18/22 0500  Weight: 134.6 kg 127.3 kg 127.3 kg    Intake/Output:   Intake/Output Summary (Last 24 hours) at 09/18/2022 [id number] Last data filed at 09/18/2022 0900 Gross per 24 hour  Intake 1199.85 ml  Output 1650 ml  Net -450.15 ml    Physical Exam   General:  fatigued appearing elderly male, obese and wearing BiPAP HEENT: normal Neck: supple. JVD to jaw. Carotids 2+ bilat; no bruits. No lymphadenopathy or thyromegaly appreciated. Cor: PMI nondisplaced. Irregularly irregular rhythm and rate. No rubs, gallops or murmurs. Lungs: decreased BS at the bases  Abdomen: soft, nontender, nondistended. No hepatosplenomegaly. No bruits. Good bowel sounds. + abdominal binder  Extremities: no cyanosis, clubbing, rash, 2+ b/l edema up to thighs + RUE PICC  Neuro:  alert & oriented x 3, cranial nerves grossly intact. moves all 4 extremities w/o difficulty. Affect pleasant.   Telemetry   Afib 90s personally checked.   EKG    No new EKG to review  Labs    CBC Recent Labs    09/17/22 1530 09/18/22 0503  WBC 5.9 7.5  HGB 9.0* 8.6*  HCT 27.4* 25.8*  MCV 89.0 89.0  PLT 183 0000000   Basic Metabolic Panel Recent Labs    09/17/22 0445 09/18/22 0503  NA 129* 132*  K 3.6 3.1*  CL 90* 89*  CO2 29 27  GLUCOSE 115* 126*  BUN 47* 45*  CREATININE 1.39* 1.26*  CALCIUM 9.0 9.6   Liver Function Tests No results for input(s): "AST", "ALT", "ALKPHOS", "BILITOT", "PROT", "ALBUMIN" in the last 72 hours.  No results for input(s): "LIPASE", "AMYLASE" in the last 72 hours. Cardiac Enzymes No results for input(s): "CKTOTAL", "CKMB", "CKMBINDEX", "TROPONINI" in the last 72 hours.  BNP: BNP (last 3 results) Recent Labs    11/20/21 1409 04/06/22 1253 09/12/22 2101  BNP 485.0* 682.5* 577.3*    ProBNP (last 3 results) Recent Labs    08/15/22 1049 09/04/22 1404 09/11/22 1530  PROBNP 5,315* 11,525* 8,444*    Other results:   Imaging   No results found.  Medications:     Scheduled Medications:  atorvastatin  80 mg Oral Daily   Chlorhexidine Gluconate Cloth  6 each Topical Daily   potassium chloride  40  mEq Oral Q2H   sodium chloride flush  3 mL Intravenous Q12H   umeclidinium bromide  1 puff Inhalation Daily    Infusions:  sodium chloride     DOBUTamine 2.5 mcg/kg/min (09/18/22 0405)    PRN Medications: sodium chloride, acetaminophen, albuterol, guaiFENesin-dextromethorphan, magnesium hydroxide, morphine injection, ondansetron (ZOFRAN) IV, oxyCODONE, sodium chloride flush  Patient Profile   [name] is 77 year old with a history of CAD, STEMI 2021, DES to LCx, permanent A fib, severe MR, HTN, HLD, sleep apnea, MR, chronic lymphedema, and HFpEF.    Admitted with A/C HFpEF.   Assessment/Plan   1. Acute on chronic  HFpEF: With prominent RV dysfunction.  Echo this admission shows EF 50-55%, D-shaped septum, moderate-severe RV enlargement with moderate RV dysfunction, PASP 69, severe biatrial enlargement, moderate AS with mean gradient 21 mmHg/AVA 1.2 cm^2, IVC dilated. Diuretics held 2/26 w/ ABLA from rectus sheath hematoma. Next day w/ worsening HF/dyspnea>>120 mg IV Lasix + BiPAP. He remains on dobutamine 2.5 for RV support.  CO-OX 73%. CVP not working but remains volume up on exam.  - continue diuresis today w/ IV Lasix  - Continue DBA for RV support  - Continue BiPAP  2. Mitral regurgitation: Severe MR by echo.  Mechanism uncertain, may be atrial functional MR with severe LAE. Suspect this is driving pulmonary hypertension and RV failure.  - TEE when he is adequately diuresed to assess mitral valve.  - May be candidate for Mitraclip but will need need significant optimization.  3. AKI on CKD 3: Suspect cardiorenal syndrome. Creatinine stable at 1.31   - Continue dobutamine as above.  4. Atrial fibrillation: Permanent, has been seen by EP in the past. Very unlikely to successfully cardiovert and stay in NSR.  -Hold heparin drip w/ rectus sheath hematoma   5. CAD: No chest pain.  Had DES to mLCx in 10/21.   - Consider coronary angiography ?pre-Mitraclip once rectus sheath hematoma resolves.  - Continue atorvastatin.  6. LE wounds: Followed as outpatient by Dr. Sharol Given.  ABIs normal this admission.  - Wound care 7. Left Rectus Sheath Hematoma: 15.0 x 7.4 x 14.7 cm hematoma on CT 2/26.  -Hold heparin.  -Hgb 10.7>>9.7>>8.6>>8.0. if further drop, may need transfusion  -If hgb stable tomorrow, can start low-dose enox for DVT prophylaxis  -c/w abdominal binder  8. Anemia  - in setting of rectus sheath hematoma.  - Hgb 10.7>>9.7>>8.6>>8.0. if further drop, may need transfusion  9. Hypokalemia: resolved  - cont to supp K  w/ diuresis    Lyda Jester PA-C   09/18/2022 9:22 AM   Patient seen and  examined with the above-signed Advanced Practice Provider and/or Housestaff. I personally reviewed laboratory data, imaging studies and relevant notes. I independently examined the patient and formulated the important aspects of the plan. I have edited the note to reflect any of my changes or salient points. I have personally discussed the plan with the patient and/or family.  Remains SOB but says he feels better than yesterday. Now off bipap. Coughing up white phlegm. Remains on DBA for RV support. CVP 15. Co-ox ok  Hgb stabilizing. Ab hematoma improving  General:  Elderly. Ill-appearing. + cough and dyspnea HEENT: normal Neck: supple. JVP to jaw  Carotids 2+ bilat; no bruits. No lymphadenopathy or thryomegaly appreciated. Cor: PMI nondisplaced. Irregular rate & rhythm. 2/6 MR Lungs: clear Abdomen: obese soft, nontender, nondistended. No hepatosplenomegaly. No bruits or masses. Good bowel sounds. Extremities: no cyanosis, clubbing, rash, 2+  edema Neuro: alert & orientedx3, cranial nerves grossly intact. moves all 4 extremities w/o difficulty. Affect pleasant  Remains very tenuous. Will restart lasix gtt for volume removal. Watch co-ox and renal function. Can start low-dose enox for DVT prophylaxis.   I remain concerned about his overall prognosis. He says his family realizes how sick he is. He reaffirms DN/DNI   Glori Bickers, MD  6:38 PM

## 2022-09-19 NOTE — Progress Notes (Signed)
PT Cancellation Note  Patient Details Name: Ryan Garner MRN: [id number] DOB: 09/04/1945   Cancelled Treatment:    Reason Eval/Treat Not Completed: Medical issues which prohibited therapy (Awaiting clarification from MD on if pt is safe to mobilize again.)   Viann Shove 09/19/2022, 3:42 PM

## 2022-09-20 DIAGNOSIS — I5033 Acute on chronic diastolic (congestive) heart failure: Secondary | ICD-10-CM | POA: Diagnosis not present

## 2022-09-20 LAB — TYPE AND SCREEN
ABO/RH(D): B POS
Antibody Screen: NEGATIVE

## 2022-09-20 LAB — COOXEMETRY PANEL
Carboxyhemoglobin: 2.8 % — ABNORMAL HIGH (ref 0.5–1.5)
Methemoglobin: 0.7 % (ref 0.0–1.5)
O2 Saturation: 77.6 %
Total hemoglobin: 8.1 g/dL — ABNORMAL LOW (ref 12.0–16.0)

## 2022-09-20 LAB — HEPATIC FUNCTION PANEL
ALT: 13 U/L (ref 0–44)
AST: 21 U/L (ref 15–41)
Albumin: 2.6 g/dL — ABNORMAL LOW (ref 3.5–5.0)
Alkaline Phosphatase: 54 U/L (ref 38–126)
Bilirubin, Direct: 0.5 mg/dL — ABNORMAL HIGH (ref 0.0–0.2)
Indirect Bilirubin: 1.4 mg/dL — ABNORMAL HIGH (ref 0.3–0.9)
Total Bilirubin: 1.9 mg/dL — ABNORMAL HIGH (ref 0.3–1.2)
Total Protein: 6.3 g/dL — ABNORMAL LOW (ref 6.5–8.1)

## 2022-09-20 LAB — CBC
HCT: 23 % — ABNORMAL LOW (ref 39.0–52.0)
Hemoglobin: 7.8 g/dL — ABNORMAL LOW (ref 13.0–17.0)
MCH: 29.9 pg (ref 26.0–34.0)
MCHC: 33.9 g/dL (ref 30.0–36.0)
MCV: 88.1 fL (ref 80.0–100.0)
Platelets: 185 10*3/uL (ref 150–400)
RBC: 2.61 MIL/uL — ABNORMAL LOW (ref 4.22–5.81)
RDW: 16.4 % — ABNORMAL HIGH (ref 11.5–15.5)
WBC: 7.4 10*3/uL (ref 4.0–10.5)
nRBC: 0 % (ref 0.0–0.2)

## 2022-09-20 LAB — MAGNESIUM: Magnesium: 2 mg/dL (ref 1.7–2.4)

## 2022-09-20 LAB — BASIC METABOLIC PANEL
Anion gap: 7 (ref 5–15)
BUN: 46 mg/dL — ABNORMAL HIGH (ref 8–23)
CO2: 32 mmol/L (ref 22–32)
Calcium: 9.2 mg/dL (ref 8.9–10.3)
Chloride: 93 mmol/L — ABNORMAL LOW (ref 98–111)
Creatinine, Ser: 1.4 mg/dL — ABNORMAL HIGH (ref 0.61–1.24)
GFR, Estimated: 51 mL/min — ABNORMAL LOW (ref >60)
Glucose, Bld: 99 mg/dL (ref 70–99)
Potassium: 3.7 mmol/L (ref 3.5–5.1)
Sodium: 132 mmol/L — ABNORMAL LOW (ref 135–145)

## 2022-09-20 MED ORDER — MIDODRINE HCL 5 MG PO TABS
2.5000 mg | ORAL_TABLET | Freq: Three times a day (TID) | ORAL | Status: DC
  Administered 2022-09-20 – 2022-10-11 (×59): 2.5 mg via ORAL
  Filled 2022-09-20 (×60): qty 1

## 2022-09-20 MED ORDER — POTASSIUM CHLORIDE CRYS ER 20 MEQ PO TBCR
40.0000 meq | EXTENDED_RELEASE_TABLET | Freq: Two times a day (BID) | ORAL | Status: AC
  Administered 2022-09-20 (×2): 40 meq via ORAL
  Filled 2022-09-20 (×2): qty 2

## 2022-09-20 MED ORDER — SENNOSIDES-DOCUSATE SODIUM 8.6-50 MG PO TABS
1.0000 | ORAL_TABLET | Freq: Every day | ORAL | Status: DC
  Administered 2022-09-20 – 2022-10-11 (×20): 1 via ORAL
  Filled 2022-09-20 (×21): qty 1

## 2022-09-20 NOTE — Progress Notes (Signed)
Physical Therapy Treatment Patient Details Name: Ryan Garner MRN: [id number] DOB: [birth year] Today's Date: 09/20/2022   History of Present Illness Pt is a 77 y.o. M who presents 09/12/2022 with A/C HFpEF. Significant PMH: CAD, STEMI 2021, DES to LCx, permanent A fib, severe MR, HTN, HLD, sleep apnea, DM, rotator cuff repair, MR, chronic lymphedema, and HFpEF.    PT Comments    Pt had fair tolerance to treatment today. Pt was able to stand from chair with +2 Mod A and perform standing marches however continues to be limited by fatigue and decreased activity tolerance. DC rec updated to SNF given pt current status. Pt will continue to follow.   Recommendations for follow up therapy are one component of a multi-disciplinary discharge planning process, led by the attending physician.  Recommendations may be updated based on patient status, additional functional criteria and insurance authorization.  Follow Up Recommendations  Skilled nursing-short term rehab (<3 hours/day) Can patient physically be transported by private vehicle: No   Assistance Recommended at Discharge Frequent or constant Supervision/Assistance  Patient can return home with the following A lot of help with walking and/or transfers;Assistance with cooking/housework;Help with stairs or ramp for entrance;Assist for transportation   Equipment Recommendations  Other (comment) (per accepting facility)    Recommendations for Other Services       Precautions / Restrictions Precautions Precautions: Fall Restrictions Weight Bearing Restrictions: No     Mobility  Bed Mobility               General bed mobility comments: Pt received in chair    Transfers Overall transfer level: Needs assistance Equipment used: Rolling walker (2 wheels) Transfers: Sit to/from Stand Sit to Stand: +2 physical assistance, Mod assist                Ambulation/Gait               General Gait Details: Gait deferred for  safety however pt was able to perform standing marches   Stairs             Wheelchair Mobility    Modified Rankin (Stroke Patients Only)       Balance Overall balance assessment: Mild deficits observed, not formally tested                                          Cognition Arousal/Alertness: Awake/alert Behavior During Therapy: Flat affect Overall Cognitive Status: No family/caregiver present to determine baseline cognitive functioning                                 General Comments: Pt with flat affect        Exercises      General Comments General comments (skin integrity, edema, etc.): BP: 97/66 seated. 93% on RA seated down to 90% while standing. Son present during session      Pertinent Vitals/Pain Pain Assessment Pain Assessment: No/denies pain    Home Living                          Prior Function            PT Goals (current goals can now be found in the care plan section) Progress towards PT goals: Progressing toward goals    Frequency  Min 3X/week      PT Plan Discharge plan needs to be updated    Co-evaluation              AM-PAC PT "6 Clicks" Mobility   Outcome Measure  Help needed turning from your back to your side while in a flat bed without using bedrails?: A Lot Help needed moving from lying on your back to sitting on the side of a flat bed without using bedrails?: A Lot Help needed moving to and from a bed to a chair (including a wheelchair)?: A Lot Help needed standing up from a chair using your arms (e.g., wheelchair or bedside chair)?: A Lot Help needed to walk in hospital room?: Total Help needed climbing 3-5 steps with a railing? : Total 6 Click Score: 10    End of Session Equipment Utilized During Treatment: Gait belt Activity Tolerance: Patient limited by fatigue Patient left: in chair;with call bell/phone within reach;with chair alarm set Nurse Communication:  Mobility status PT Visit Diagnosis: Muscle weakness (generalized) (M62.81);Difficulty in walking, not elsewhere classified (R26.2)     Time: 0137-0207 PT Time Calculation (min) (ACUTE ONLY): 30 min  Charges:  $Therapeutic Activity: 23-37 mins                     Shelby Mattocks, PT, DPT Acute Rehab Services [id number]    Viann Shove 09/20/2022, 4:33 PM

## 2022-09-20 NOTE — Progress Notes (Signed)
PT Cancellation Note  Patient Details Name: DEMETRI DALPIAZ MRN: [id number] DOB: [birth year]   Cancelled Treatment:    Reason Eval/Treat Not Completed: Active bedrest order   Viann Shove 09/20/2022, 8:22 AM

## 2022-09-20 NOTE — Progress Notes (Signed)
OT Cancellation Note  Patient Details Name: Ryan Garner MRN: [id number] DOB: 02-18-1946   Cancelled Treatment:    Reason Eval/Treat Not Completed: Active bedrest order  Tresa Moore, OTR/L [facility] [phone] - [place] 09/20/2022, 7:18 AM

## 2022-09-20 NOTE — Plan of Care (Signed)

## 2022-09-20 NOTE — Plan of Care (Signed)
  Problem: Clinical Measurements: Goal: Will remain free from infection Outcome: Progressing   Problem: Activity: Goal: Risk for activity intolerance will decrease Outcome: Progressing   Problem: Elimination: Goal: Will not experience complications related to urinary retention Outcome: Progressing

## 2022-09-20 NOTE — Progress Notes (Addendum)
Patient ID: Ryan Garner, male   DOB: July 16, 1946, 77 y.o.   MRN: [id number]     Advanced Heart Failure Rounding Note  PCP-Cardiologist: Dorris Carnes, MD   Subjective:    2/26 acute abdominal pain>>CT A/P shows large left rectus sheath hematoma. Heparin d/ced. Lasix gtt stopped.  2/27 acute HF w/ acute worsening of dyspnea>>120 mg IV Lasix + BiPAP    2.5L in UOP yesterday, net negative 1.4L. CVP 14 and remains volume overloaded on exam.   Hgb 10.7>>9.7>>8.6>>8.0. >>7.8 SBPs 90s  Remains on dobutamine 2.5 for RV support. Co-ox 77.6%.   Patient on nasal cannula. He denies chest pain. Has chronic dyspnea but feels this is somewhat improved today. He is eager to get out of bed to his chair.    Objective:   Weight Range: 125.7 kg Body mass index is 42.14 kg/m.   Vital Signs:   Temp:  [98.4 F (36.9 C)-99.2 F (37.3 C)] 98.4 F (36.9 C) (02/29 0800) Pulse Rate:  [91-100] 92 (02/29 0800) Resp:  [17-20] 18 (02/29 0800) BP: (88-122)/(53-67) 91/53 (02/29 0800) SpO2:  [92 %-99 %] 99 % (02/29 0800) Weight:  [125.7 kg] 125.7 kg (02/29 0400) Last BM Date : 09/16/22  Weight change: Filed Weights   09/18/22 0500 09/19/22 0400 09/20/22 0400  Weight: 127.3 kg 128 kg 125.7 kg    Intake/Output:   Intake/Output Summary (Last 24 hours) at 09/20/2022 1009 Last data filed at 09/20/2022 0858 Gross per 24 hour  Intake 1404.73 ml  Output 3000 ml  Net -1595.27 ml     Physical Exam   Constitutional: chronically ill appearing and in no distress.  Neck: JVD  Cardiovascular: Normal rate, irregularly irregular rhythm, intact distal pulses. No gallop and no friction rub.  No murmur heard. No lower extremity edema  Pulmonary: Non labored breathing on [place], no wheezing or rales  Abdominal: Soft. Normal bowel sounds. Non distended and non tender Musculoskeletal: Normal range of motion.      Neurological: Alert and oriented to person, place, and time. Non focal  Extremities: 2+ BLE up to hips,  +RUE PICC  Skin: Skin is warm and dry.    Telemetry   Afib 90s   EKG    No new EKG to review  Labs    CBC Recent Labs    09/19/22 0504 09/20/22 0350  WBC 7.5 7.4  HGB 8.0* 7.8*  HCT 23.8* 23.0*  MCV 87.8 88.1  PLT 192 [id number]    Basic Metabolic Panel Recent Labs    09/18/22 0503 09/19/22 0504 09/19/22 1815 09/20/22 0350  NA 132*   < > 131* 132*  K 3.1*   < > 3.6 3.7  CL 89*   < > 93* 93*  CO2 27   < > 30 32  GLUCOSE 126*   < > 171* 99  BUN 45*   < > 45* 46*  CREATININE 1.26*   < > 1.38* 1.40*  CALCIUM 9.6   < > 9.2 9.2  MG 1.9  --   --  2.0   < > = values in this interval not displayed.     BNP: BNP (last 3 results) Recent Labs    11/20/21 1409 04/06/22 1253 09/12/22 2101  BNP 485.0* 682.5* 577.3*     ProBNP (last 3 results) Recent Labs    08/15/22 1049 09/04/22 1404 09/11/22 1530  PROBNP 5,315* 11,525* 8,444*      Imaging   No results found.  Medications:  Scheduled Medications:  atorvastatin  80 mg Oral Daily   Chlorhexidine Gluconate Cloth  6 each Topical Daily   enoxaparin (LOVENOX) injection  30 mg Subcutaneous Q24H   senna-docusate  1 tablet Oral Daily   sodium chloride flush  3 mL Intravenous Q12H   umeclidinium bromide  1 puff Inhalation Daily    Infusions:  sodium chloride     DOBUTamine 2.5 mcg/kg/min (09/20/22 0415)   furosemide (LASIX) 200 mg in dextrose 5 % 100 mL (2 mg/mL) infusion 15 mg/hr (09/20/22 0414)    PRN Medications: sodium chloride, acetaminophen, albuterol, guaiFENesin-dextromethorphan, magnesium hydroxide, morphine injection, ondansetron (ZOFRAN) IV, oxyCODONE, sodium chloride flush  Patient Profile   Mr Lowery is 77 year old with a history of CAD, STEMI 2021, DES to LCx, permanent A fib, severe MR, HTN, HLD, sleep apnea, MR, chronic lymphedema, and HFpEF.    Admitted with A/C HFpEF.   Assessment/Plan   1. Acute on chronic HFpEF: With prominent RV dysfunction.  Echo this admission shows EF  50-55%, D-shaped septum, moderate-severe RV enlargement with moderate RV dysfunction, PASP 69, severe biatrial enlargement, moderate AS with mean gradient 21 mmHg/AVA 1.2 cm^2, IVC dilated. Diuretics held 2/26 w/ ABLA from rectus sheath hematoma. Had worsening HF/dyspnea>>120 mg IV Lasix + BiPAP. Lasix infusion started 2/28. He remains on dobutamine 2.5 for RV support.  CO-OX 77.6%. CVP 14.  - continue diuresis with lasix infusion at 15 - Continue DBA for RV support   2. Mitral regurgitation: Severe MR by echo.  Mechanism uncertain, may be functional MR with severe LAE. Suspect this is etiology of pulmonary hypertension and RV failure.  - TEE when he is adequately diuresed to assess mitral valve.  - May be candidate for Mitraclip but will need need significant optimization.   3. AKI on CKD 3: Suspect cardiorenal in etiology. Creatinine stable 1.4 from 1.38  - Continue dobutamine as above.   4. Atrial fibrillation: Permanent, has been seen by EP in the past. Not likely to successfully cardiovert and stay in NSR.  -Resume apixaban if Hgb remains stable   5. CAD: No chest pain.  Had DES to mLCx in 10/21.   - Consider coronary angiography ?pre-Mitraclip once rectus sheath hematoma resolves.  - Continue atorvastatin.   6. LE wounds: Followed as outpatient by Dr. Sharol Given.  ABIs normal this admission.  - Wound care  7. Left Rectus Sheath Hematoma: 15.0 x 7.4 x 14.7 cm hematoma on CT 2/26.  -Hold heparin. On VTE prophylaxis with lovenox '30mg'$  qd -Hgb 10.7>>9.7>>8.6>>8.0>>7.8. if further drop, may need transfusion  -If hgb stable tomorrow, consider resuming apixaban  -c/w abdominal binder   8. Anemia  - in setting of rectus sheath hematoma.  - Hgb 10.7>>9.7>>8.6>>8.0. if further drop, may need transfusion  -Will check iron panel, vit b12, folate   [place] [facility]  09/20/2022 10:09 AM  Patient seen and examined with the above-signed Advanced Practice Provider  and/or Housestaff. I personally reviewed laboratory data, imaging studies and relevant notes. I independently examined the patient and formulated the important aspects of the plan. I have edited the note to reflect any of my changes or salient points. I have personally discussed the plan with the patient and/or family.  Remains on DBA. Urine output has picked up. Respiratory status improved. Co-ox ok but CVP remains high. HGb stable.   General:  Lying in bed. No resp difficulty HEENT: normal Neck: supple. JVP to jaw. Carotids 2+ bilat; no bruits.  No lymphadenopathy or thryomegaly appreciated. Cor: PMI nondisplaced. Irregular rate & rhythm. 2/6 AS and MR Lungs: clear Abdomen: obese soft, nontender, nondistended. No hepatosplenomegaly. No bruits or masses. Good bowel sounds. Extremities: no cyanosis, clubbing, rash, 1-2+ edema Neuro: alert & orientedx3, cranial nerves grossly intact. moves all 4 extremities w/o difficulty. Affect pleasant  Improved with IV diuresis and DBA support. Will continue. Hgb stable. Consdier restarting Eliquis in am. I do not think he will be candidate for mTEER int he near future.   Glori Bickers, MD  10:34 PM

## 2022-09-21 DIAGNOSIS — I5033 Acute on chronic diastolic (congestive) heart failure: Secondary | ICD-10-CM | POA: Diagnosis not present

## 2022-09-21 LAB — BASIC METABOLIC PANEL
Anion gap: 12 (ref 5–15)
BUN: 46 mg/dL — ABNORMAL HIGH (ref 8–23)
CO2: 30 mmol/L (ref 22–32)
Calcium: 9 mg/dL (ref 8.9–10.3)
Chloride: 89 mmol/L — ABNORMAL LOW (ref 98–111)
Creatinine, Ser: 1.3 mg/dL — ABNORMAL HIGH (ref 0.61–1.24)
GFR, Estimated: 56 mL/min — ABNORMAL LOW (ref >60)
Glucose, Bld: 107 mg/dL — ABNORMAL HIGH (ref 70–99)
Potassium: 3.2 mmol/L — ABNORMAL LOW (ref 3.5–5.1)
Sodium: 131 mmol/L — ABNORMAL LOW (ref 135–145)

## 2022-09-21 LAB — IRON AND TIBC
Iron: 52 ug/dL (ref 45–182)
Saturation Ratios: 14 % — ABNORMAL LOW (ref 17.9–39.5)
TIBC: 386 ug/dL (ref 250–450)
UIBC: 334 ug/dL

## 2022-09-21 LAB — CBC
HCT: 24.1 % — ABNORMAL LOW (ref 39.0–52.0)
Hemoglobin: 8.2 g/dL — ABNORMAL LOW (ref 13.0–17.0)
MCH: 30 pg (ref 26.0–34.0)
MCHC: 34 g/dL (ref 30.0–36.0)
MCV: 88.3 fL (ref 80.0–100.0)
Platelets: 208 10*3/uL (ref 150–400)
RBC: 2.73 MIL/uL — ABNORMAL LOW (ref 4.22–5.81)
RDW: 16.5 % — ABNORMAL HIGH (ref 11.5–15.5)
WBC: 8.2 10*3/uL (ref 4.0–10.5)
nRBC: 0 % (ref 0.0–0.2)

## 2022-09-21 LAB — RETICULOCYTES
Immature Retic Fract: 23 % — ABNORMAL HIGH (ref 2.3–15.9)
RBC.: 2.75 MIL/uL — ABNORMAL LOW (ref 4.22–5.81)
Retic Count, Absolute: 71.8 10*3/uL (ref 19.0–186.0)
Retic Ct Pct: 2.6 % (ref 0.4–3.1)

## 2022-09-21 LAB — VITAMIN B12: Vitamin B-12: 519 pg/mL (ref 180–914)

## 2022-09-21 LAB — FERRITIN: Ferritin: 82 ng/mL (ref 24–336)

## 2022-09-21 LAB — COOXEMETRY PANEL
Carboxyhemoglobin: 3.3 % — ABNORMAL HIGH (ref 0.5–1.5)
Methemoglobin: <0.7 % (ref 0.0–1.5)
O2 Saturation: 61.6 %
Total hemoglobin: 8.5 g/dL — ABNORMAL LOW (ref 12.0–16.0)

## 2022-09-21 LAB — FOLATE: Folate: 6.7 ng/mL (ref >5.9)

## 2022-09-21 MED ORDER — APIXABAN 5 MG PO TABS
5.0000 mg | ORAL_TABLET | Freq: Two times a day (BID) | ORAL | Status: DC
  Administered 2022-09-21 – 2022-10-02 (×23): 5 mg via ORAL
  Filled 2022-09-21 (×23): qty 1

## 2022-09-21 MED ORDER — POTASSIUM CHLORIDE CRYS ER 20 MEQ PO TBCR
60.0000 meq | EXTENDED_RELEASE_TABLET | Freq: Once | ORAL | Status: AC
  Administered 2022-09-21: 60 meq via ORAL
  Filled 2022-09-21: qty 3

## 2022-09-21 MED ORDER — IRON SUCROSE 500 MG IVPB - SIMPLE MED
500.0000 mg | Freq: Once | INTRAVENOUS | Status: AC
  Administered 2022-09-21: 500 mg via INTRAVENOUS
  Filled 2022-09-21: qty 275

## 2022-09-21 NOTE — Progress Notes (Addendum)
Patient ID: KAULIN PARROTTE, male   DOB: 1945-08-27, 77 y.o.   MRN: [id number]     Advanced Heart Failure Rounding Note  PCP-Cardiologist: Dorris Carnes, MD   Subjective:    2/26 acute abdominal pain>>CT A/P shows large left rectus sheath hematoma. Heparin d/ced. Lasix gtt stopped.  2/27 acute HF w/ acute worsening of dyspnea>>120 mg IV Lasix + BiPAP   Yesterday diuresed with lasix drip.  I/O not accurate. Weight down another 7 pounds. Overall weight down 36 pounds.   Hgb 7.8>8.2   Remains on dobutamine 2.5 for RV support + lasix drip 15 mg per hour.  Co-ox 62%   Feeling better. Denies SOB.    Objective:   Weight Range: 122.6 kg Body mass index is 41.1 kg/m.   Vital Signs:   Temp:  [97.6 F (36.4 C)-98.7 F (37.1 C)] 98.7 F (37.1 C) (03/01 0345) Pulse Rate:  [83-94] 92 (03/01 0359) Resp:  [17-19] 19 (03/01 0345) BP: (93-107)/(63-73) 107/66 (03/01 0345) SpO2:  [95 %-98 %] 96 % (03/01 0359) Weight:  [122.6 kg] 122.6 kg (03/01 0345) Last BM Date : 09/13/22  Weight change: Filed Weights   09/19/22 0400 09/20/22 0400 09/21/22 0345  Weight: 128 kg 125.7 kg 122.6 kg    Intake/Output:   Intake/Output Summary (Last 24 hours) at 09/21/2022 0959 Last data filed at 09/21/2022 0945 Gross per 24 hour  Intake 524.65 ml  Output 1801 ml  Net -1276.35 ml  CVP 10-11   Physical Exam  General: Sitting in the chair. No resp difficulty HEENT: normal Neck: supple. JVP 10-11 . Carotids 2+ bilat; no bruits. No lymphadenopathy or thryomegaly appreciated. Cor: PMI nondisplaced. Irregular rate & rhythm. No rubs, gallops or murmurs. Lungs: clear Abdomen: soft, nontender, nondistended. No hepatosplenomegaly. No bruits or masses. Good bowel sounds. Extremities: no cyanosis, clubbing, rash, R and LLE 1+ edema in thighs. . RUE pICC  Neuro: alert & orientedx3, cranial nerves grossly intact. moves all 4 extremities w/o difficulty. Affect pleasant  Telemetry    A fib 80-90s   EKG    No new EKG  to review  Labs    CBC Recent Labs    09/20/22 0350 09/21/22 0520  WBC 7.4 8.2  HGB 7.8* 8.2*  HCT 23.0* 24.1*  MCV 88.1 88.3  PLT 185 [id number]   Basic Metabolic Panel Recent Labs    09/20/22 0350 09/21/22 0520  NA 132* 131*  K 3.7 3.2*  CL 93* 89*  CO2 32 30  GLUCOSE 99 107*  BUN 46* 46*  CREATININE 1.40* 1.30*  CALCIUM 9.2 9.0  MG 2.0  --     BNP: BNP (last 3 results) Recent Labs    11/20/21 1409 04/06/22 1253 09/12/22 2101  BNP 485.0* 682.5* 577.3*    ProBNP (last 3 results) Recent Labs    08/15/22 1049 09/04/22 1404 09/11/22 1530  PROBNP 5,315* 11,525* 8,444*     Imaging   No results found.  Medications:     Scheduled Medications:  atorvastatin  80 mg Oral Daily   Chlorhexidine Gluconate Cloth  6 each Topical Daily   enoxaparin (LOVENOX) injection  30 mg Subcutaneous Q24H   midodrine  2.5 mg Oral TID WC   senna-docusate  1 tablet Oral Daily   sodium chloride flush  3 mL Intravenous Q12H   umeclidinium bromide  1 puff Inhalation Daily    Infusions:  sodium chloride     DOBUTamine 2.5 mcg/kg/min (09/20/22 1439)   furosemide (LASIX) 200 mg in  dextrose 5 % 100 mL (2 mg/mL) infusion 15 mg/hr (09/20/22 1757)    PRN Medications: sodium chloride, acetaminophen, albuterol, guaiFENesin-dextromethorphan, magnesium hydroxide, morphine injection, ondansetron (ZOFRAN) IV, oxyCODONE, sodium chloride flush  Patient Profile   Mr Clendenen is 77 year old with a history of CAD, STEMI 2021, DES to LCx, permanent A fib, severe MR, HTN, HLD, sleep apnea, MR, chronic lymphedema, and HFpEF.    Admitted with A/C HFpEF.   Assessment/Plan   1. Acute on chronic HFpEF: With prominent RV dysfunction.  Echo this admission shows EF 50-55%, D-shaped septum, moderate-severe RV enlargement with moderate RV dysfunction, PASP 69, severe biatrial enlargement, moderate AS with mean gradient 21 mmHg/AVA 1.2 cm^2, IVC dilated. Diuretics held 2/26 w/ ABLA from rectus sheath  hematoma. Had worsening HF/dyspnea>>120 mg IV Lasix + BiPAP. Lasix infusion started 2/28. He remains on dobutamine 2.5 for RV support.   - CVP down to 10. Continue lasix drip.  - Continue DBA for RV support  - Renal functions table.   2. Mitral regurgitation: Severe MR by echo.  Mechanism uncertain, may be functional MR with severe LAE. Suspect this is etiology of pulmonary hypertension and RV failure.  - TEE not indicated given he is not a candidate for  Mitraclip   3. AKI on CKD 3: Suspect cardiorenal in etiology. Creatinine stable 1.3  - Continue dobutamine as above.   4. Atrial fibrillation: Permanent, has been seen by EP in the past. Not likely to successfully cardiovert and stay in NSR.  -Hgb stable. Start eliquis 5 mg twice daily this evening.   5. CAD: No chest pain.  Had DES to mLCx in 10/21.   - Consider coronary angiography ?pre-Mitraclip once rectus sheath hematoma resolves.  - Continue atorvastatin.   6. LE wounds: Followed as outpatient by Dr. Sharol Given.  ABIs normal this admission.  - Wound care  7. Left Rectus Sheath Hematoma: 15.0 x 7.4 x 14.7 cm hematoma on CT 2/26.  -Heparin stopped.  Stop lovenox. Start eliquis this eventing.  -Hgb 7.8> 8.2 , transfusion if drops below 7.5  -c/w abdominal binder   8. Anemia  - in setting of rectus sheath hematoma.  - Hgb stabilized at 8.2  - Give Venofer today. Iron sats 15% Ferritin 82.    Start to ambulate with PT. --> ? Acute Rehab. Consult.  Amy Clegg NP-C   09/21/2022 9:59 AM  Patient seen and examined with the above-signed Advanced Practice Provider and/or Housestaff. I personally reviewed laboratory data, imaging studies and relevant notes. I independently examined the patient and formulated the important aspects of the plan. I have edited the note to reflect any of my changes or salient points. I have personally discussed the plan with the patient and/or family.  Continues to feel better. Denies SOB, orthopnea or PND.  Remains on DBA and lasix gtt with good diuresis. Hgb stable. Wants to eat  General:  Sitting up in chair . No resp difficulty HEENT: normal Neck: supple. JVP 10 Carotids 2+ bilat; no bruits. No lymphadenopathy or thryomegaly appreciated. Cor: PMI nondisplaced. Irregular rate & rhythm. 2/6 Mr Lungs: clear Abdomen: obese soft, nontender, nondistended. No hepatosplenomegaly. No bruits or masses. Good bowel sounds. Extremities: no cyanosis, clubbing, rash, 1+ edema wounds healing Neuro: alert & orientedx3, cranial nerves grossly intact. moves all 4 extremities w/o difficulty. Affect pleasant  Improving with DBA and IV diuresis. Will continue for now. Rectus sheath hematoma appears stable. Wil resume Eliquis. Needs PT/OT. Would defer mTEER w/u for now.  Glori Bickers, MD  6:35 PM

## 2022-09-21 NOTE — Progress Notes (Signed)
Orthopedic Tech Progress Note Patient Details:  Ryan Garner 1946/03/11 [id number]  Ortho Devices Type of Ortho Device: Haematologist Ortho Device/Splint Location: BLE Ortho Device/Splint Interventions: Ordered, Application   Post Interventions Patient Tolerated: Well Instructions Provided: Care of device  Atlas Kuc A Debera Sterba 09/21/2022, 5:03 PM

## 2022-09-21 NOTE — TOC Initial Note (Addendum)
Transition of Care [facility]) - Initial/Assessment Note    Patient Details  Name: Ryan Garner MRN: [id number] Date of Birth: 1946-03-08  Transition of Care [facility]) CM/SW Contact:    Erenest Rasher, RN Phone Number: [phone] 09/21/2022, 2:19 PM  Clinical Narrative:                  CM spoke to pt and states he lives alone. He has caregiver that is covered by his [place] that comes a few hours a few times a week. States he can increase hours if needed. Pt is active with [facility] and spoke to rep, Caryl Pina the can add all disciplines in the home [facility] RN, PT, OT, aide and SW. PT recommending IP rehab, [facility] will continue to follow him at [facility].   CIR referral pending.      Expected Discharge Plan: [place] Barriers to Discharge: Continued Medical Work up   Patient Goals and CMS Choice Patient states their goals for this hospitalization and ongoing recovery are:: wants to remain independent CMS Medicare.gov Compare Post Acute Care list provided to:: Patient        Expected Discharge Plan and Services   Discharge Planning Services: CM Consult Post Acute Care Choice: Home Health                             HH Arranged: RN, PT [facility] Agency: [place] ([facility]) Date [place]: 09/21/22 Time [place]: [phone] Representative spoke with at [place]: [place] Arrangements/Services   Lives with:: Self Patient language and need for interpreter reviewed:: Yes Do you feel safe going back to the place where you live?: Yes      Need for Family Participation in Patient Care: Yes (Comment) Care giver support system in place?: Yes (comment) Current home services: DME (scale) Criminal Activity/Legal Involvement Pertinent to Current Situation/Hospitalization: No - Comment as needed  Activities of Daily Living      Permission Sought/Granted Permission sought to share information with : Case  Manager, Family Supports, PCP Permission granted to share information with : Yes, Verbal Permission Granted  Share Information with NAME: Kente Collis  Permission granted to share info w AGENCY: [place] granted to share info w Relationship: son  Permission granted to share info w Contact Information: [phone]  Emotional Assessment Appearance:: Appears stated age Attitude/Demeanor/Rapport: Engaged Affect (typically observed): Accepting Orientation: : Oriented to Self, Oriented to Place, Oriented to  Time, Oriented to Situation   Psych Involvement: No (comment)  Admission diagnosis:  Acute on chronic diastolic heart failure (HCC) [I50.33] Acute on chronic systolic (congestive) heart failure (HCC) [I50.23] Patient Active Problem List   Diagnosis Date Noted   Acute on chronic systolic (congestive) heart failure ([place]) 09/17/2022   Acute on chronic diastolic heart failure ([place]) 09/12/2022   Pulmonary hypertension ([place]) 07/13/2022   OSA (obstructive sleep apnea) 07/13/2022   Lymphedema 07/13/2022   Nonrheumatic mitral (valve) insufficiency    Aortic valve stenosis, nonrheumatic    Hypotension    Transient hypotension 04/06/2022   Bradycardia 04/06/2022   (HFpEF) heart failure with preserved ejection fraction ([place]) 04/06/2022   Abscess of leg, left 04/04/2022   Cellulitis of left lower extremity 03/02/2022   Chronic diastolic CHF (congestive heart failure) ([place]) 03/02/2022   Cellulitis of left leg 03/02/2022   Foot callus 12/15/2021   Other  secondary pulmonary hypertension ([place]) 08/30/2021   COPD (chronic obstructive pulmonary disease) ([place]) 08/30/2021   Coronary artery disease due to type 2 diabetes mellitus ([place]) 07/21/2020   CHF (congestive heart failure) ([place]) 07/21/2020   History of ST elevation myocardial infarction (STEMI) 04/30/2020   Atrial fibrillation ([place]) 02/29/2020   AKI (acute kidney injury) ([place]) 02/28/2020   Hypokalemia 02/28/2020    Hyponatremia 02/28/2020   Lumbar radiculopathy 03/24/2019   Morbid obesity ([place]) 09/04/2018   Lung nodules 01/17/2017   Ascending aortic aneurysm 01/17/2017   Type 2 diabetes mellitus ([place]) 03/20/2016   Tear of lateral meniscus of right knee 11/19/2014   Hypertension    Hyperlipidemia    Metabolic syndrome    Osteoarthritis of both knees 07/06/2013   Acquired spondylolisthesis 03/06/2013   Degeneration of lumbar intervertebral disc 03/06/2013   Lumbar spondylosis 03/06/2013   Spinal stenosis of lumbar region 03/06/2013   PCP:  [name], [name], MD Pharmacy:   [place], [place] [address] Phone: [phone] Fax: [place] [address] [place] [place] [address] Phone: [phone] Fax: [phone]     Social Determinants of Health (SDOH) Social History: SDOH Screenings   Food Insecurity: No Food Insecurity (07/25/2022)  Housing: Low Risk  (07/04/2022)  Transportation Needs: No Transportation Needs (07/25/2022)  Utilities: Not At Risk (07/04/2022)  Alcohol Screen: Low Risk  (07/04/2022)  Depression (PHQ2-9): Low Risk  (08/23/2022)  Financial Resource Strain: Low Risk  (07/04/2022)  Physical Activity: Insufficiently Active (07/04/2022)  Social Connections: Socially Integrated (07/04/2022)  Stress: No Stress Concern Present (07/04/2022)  Tobacco Use: Medium Risk (09/11/2022)   SDOH Interventions:     Readmission Risk Interventions     No data to display

## 2022-09-21 NOTE — Progress Notes (Signed)
Physical Therapy Treatment Patient Details Name: Ryan Garner MRN: [id number] DOB: 01/15/1946 Today's Date: 09/21/2022   History of Present Illness Pt is a 77 y.o. M who presents 09/12/2022 with A/C HFpEF. 2/26 acute abdominal pain>>CT A/P shows large left rectus sheath hematoma.Significant PMH: CAD, STEMI 2021, DES to LCx, permanent A fib, severe MR, HTN, HLD, sleep apnea, DM, rotator cuff repair, MR, chronic lymphedema, and HFpEF.    PT Comments    Pt tolerated treatment well today. Co treat with OT. Pt was able to progress gait training today however continues to require +2 for safety and equipment and is limited by decreased activity tolerance. DC rec changed from SNF to CIR as pt is highly motivated and would be able to tolerate 3 hours of PT a day. PT would benefit from continued functional mobility training during acute stay.  Recommendations for follow up therapy are one component of a multi-disciplinary discharge planning process, led by the attending physician.  Recommendations may be updated based on patient status, additional functional criteria and insurance authorization.  Follow Up Recommendations  Acute inpatient rehab (3hours/day) Can patient physically be transported by private vehicle: No   Assistance Recommended at Discharge Frequent or constant Supervision/Assistance  Patient can return home with the following A lot of help with walking and/or transfers;Assistance with cooking/housework;Help with stairs or ramp for entrance;Assist for transportation   Equipment Recommendations  Other (comment) (per accepting facility)    Recommendations for Other Services Rehab consult     Precautions / Restrictions Precautions Precautions: Fall Restrictions Weight Bearing Restrictions: No     Mobility  Bed Mobility Overal bed mobility: Needs Assistance Bed Mobility: Supine to Sit     Supine to sit: Mod assist     General bed mobility comments: VCs for sequencing     Transfers Overall transfer level: Needs assistance Equipment used: Rolling walker (2 wheels) Transfers: Sit to/from Stand Sit to Stand: Min assist           General transfer comment: Min A +2 for ambulation due to lines/tubes    Ambulation/Gait Ambulation/Gait assistance: Min guard, +2 safety/equipment Gait Distance (Feet): 15 Feet Assistive device: Rolling walker (2 wheels) Gait Pattern/deviations: Step-through pattern, Decreased stride length Gait velocity: decreased     General Gait Details: Forwards and backward to wall twice   Stairs             Wheelchair Mobility    Modified Rankin (Stroke Patients Only)       Balance Overall balance assessment: Mild deficits observed, not formally tested                                          Cognition Arousal/Alertness: Awake/alert Behavior During Therapy: Flat affect Overall Cognitive Status: Within Functional Limits for tasks assessed                                          Exercises      General Comments General comments (skin integrity, edema, etc.): VSS on RA      Pertinent Vitals/Pain Pain Assessment Pain Assessment: No/denies pain    Home Living                          Prior Function  PT Goals (current goals can now be found in the care plan section) Progress towards PT goals: Progressing toward goals    Frequency    Min 3X/week      PT Plan Discharge plan needs to be updated    Co-evaluation   Reason for Co-Treatment: For patient/therapist safety;To address functional/ADL transfers PT goals addressed during session: Mobility/safety with mobility;Balance;Proper use of DME;Strengthening/ROM OT goals addressed during session: Strengthening/ROM;ADL's and self-care      AM-PAC PT "6 Clicks" Mobility   Outcome Measure  Help needed turning from your back to your side while in a flat bed without using bedrails?: A  Lot Help needed moving from lying on your back to sitting on the side of a flat bed without using bedrails?: A Lot Help needed moving to and from a bed to a chair (including a wheelchair)?: A Lot Help needed standing up from a chair using your arms (e.g., wheelchair or bedside chair)?: A Lot Help needed to walk in hospital room?: Total Help needed climbing 3-5 steps with a railing? : Total 6 Click Score: 10    End of Session Equipment Utilized During Treatment: Gait belt Activity Tolerance: Patient tolerated treatment well Patient left: in chair;with call bell/phone within reach;with chair alarm set Nurse Communication: Mobility status PT Visit Diagnosis: Muscle weakness (generalized) (M62.81);Difficulty in walking, not elsewhere classified (R26.2)     Time: [id number] PT Time Calculation (min) (ACUTE ONLY): 31 min  Charges:  $Gait Training: 8-22 mins                     Shelby Mattocks, PT, DPT Acute Rehab Services [id number]    Viann Shove 09/21/2022, 12:40 PM

## 2022-09-21 NOTE — Progress Notes (Signed)
Occupational Therapy Treatment Patient Details Name: Ryan Garner MRN: [id number] DOB: [birth year] Today's Date: 09/21/2022   History of present illness Pt is a 77 y.o. M who presents 09/12/2022 with A/C HFpEF. 2/26 acute abdominal pain>>CT A/P shows large left rectus sheath hematoma.Significant PMH: CAD, STEMI 2021, DES to LCx, permanent A fib, severe MR, HTN, HLD, sleep apnea, DM, rotator cuff repair, MR, chronic lymphedema, and HFpEF.   OT comments  This 77 yo male seen together with PT today due to increased A he needed yesterday and with hopes of progressing his mobility. Pt did much better today. He was Mod A for up to EOB (same as yesterday) with HOB up; but Min A for sit<>stand and Min A +2 (lines/tubes)for ambulation with RW. He was able to sit in recliner and brush teeth post setup. He needed total A for back peri care post bowel movement. He will continue to benefit from acute OT with discharge plan not changed to AIR recommendation.   Recommendations for follow up therapy are one component of a multi-disciplinary discharge planning process, led by the attending physician.  Recommendations may be updated based on patient status, additional functional criteria and insurance authorization.    Follow Up Recommendations  Acute inpatient rehab (3hours/day)     Assistance Recommended at Discharge Frequent or constant Supervision/Assistance  Patient can return home with the following  Two people to help with walking and/or transfers;A lot of help with bathing/dressing/bathroom;Assistance with cooking/housework;Help with stairs or ramp for entrance;Assist for transportation   Equipment Recommendations  Other (comment) (TBD next venue)       Precautions / Restrictions Precautions Precautions: Fall Restrictions Weight Bearing Restrictions: No       Mobility Bed Mobility Overal bed mobility: Needs Assistance Bed Mobility: Supine to Sit     Supine to sit: Mod assist     General  bed mobility comments: VCs for sequencing    Transfers Overall transfer level: Needs assistance Equipment used: Rolling walker (2 wheels) Transfers: Sit to/from Stand Sit to Stand: Min assist           General transfer comment: Min A +2 for ambulation due to lines/tubes     Balance Overall balance assessment: Mild deficits observed, not formally tested (in standing)                                         ADL either performed or assessed with clinical judgement   ADL Overall ADL's : Needs assistance/impaired     Grooming: Oral care;Set up;Sitting                   Toilet Transfer: Minimal assistance;+2 for safety/equipment;Stand-pivot;BSC/3in1;Rolling walker (2 wheels)   Toileting- Clothing Manipulation and Hygiene: Total assistance Toileting - Clothing Manipulation Details (indicate cue type and reason): min A sit<>stand            Extremity/Trunk Assessment Upper Extremity Assessment Upper Extremity Assessment: Generalized weakness            Vision Patient Visual Report: No change from baseline            Cognition Arousal/Alertness: Awake/alert Behavior During Therapy: Flat affect Overall Cognitive Status: Within Functional Limits for tasks assessed  Pertinent Vitals/ Pain       Pain Assessment Pain Assessment: No/denies pain         Frequency  Min 2X/week        Progress Toward Goals  OT Goals(current goals can now be found in the care plan section)  Progress towards OT goals: Progressing toward goals  Acute Rehab OT Goals Patient Stated Goal: to go home OT Goal Formulation: With patient Time For Goal Achievement: 09/29/22 Potential to Achieve Goals: Good  Plan Discharge plan needs to be updated    Co-evaluation    PT/OT/SLP Co-Evaluation/Treatment: Yes Reason for Co-Treatment: For patient/therapist safety;To address  functional/ADL transfers PT goals addressed during session: Mobility/safety with mobility;Balance;Proper use of DME;Strengthening/ROM OT goals addressed during session: Strengthening/ROM;ADL's and self-care      AM-PAC OT "6 Clicks" Daily Activity     Outcome Measure   Help from another person eating meals?: None Help from another person taking care of personal grooming?: A Little Help from another person toileting, which includes using toliet, bedpan, or urinal?: A Lot Help from another person bathing (including washing, rinsing, drying)?: A Lot Help from another person to put on and taking off regular upper body clothing?: A Little Help from another person to put on and taking off regular lower body clothing?: A Lot 6 Click Score: 16    End of Session Equipment Utilized During Treatment: Rolling walker (2 wheels)  OT Visit Diagnosis: Unsteadiness on feet (R26.81);Other abnormalities of gait and mobility (R26.89);Muscle weakness (generalized) (M62.81)   Activity Tolerance Patient tolerated treatment well   Patient Left in chair;with call bell/phone within reach;with chair alarm set   Nurse Communication Mobility status        Time: [id number] OT Time Calculation (min): 32 min  Charges: OT General Charges $OT Visit: 1 Visit OT Treatments $Self Care/Home Management : 8-22 mins [place] Office [phone]    Almon Register 09/21/2022, 9:28 AM

## 2022-09-22 DIAGNOSIS — I5033 Acute on chronic diastolic (congestive) heart failure: Secondary | ICD-10-CM | POA: Diagnosis not present

## 2022-09-22 LAB — BASIC METABOLIC PANEL
Anion gap: 10 (ref 5–15)
BUN: 40 mg/dL — ABNORMAL HIGH (ref 8–23)
CO2: 32 mmol/L (ref 22–32)
Calcium: 9 mg/dL (ref 8.9–10.3)
Chloride: 89 mmol/L — ABNORMAL LOW (ref 98–111)
Creatinine, Ser: 1.32 mg/dL — ABNORMAL HIGH (ref 0.61–1.24)
GFR, Estimated: 55 mL/min — ABNORMAL LOW (ref >60)
Glucose, Bld: 113 mg/dL — ABNORMAL HIGH (ref 70–99)
Potassium: 3.2 mmol/L — ABNORMAL LOW (ref 3.5–5.1)
Sodium: 131 mmol/L — ABNORMAL LOW (ref 135–145)

## 2022-09-22 LAB — CBC
HCT: 23.4 % — ABNORMAL LOW (ref 39.0–52.0)
Hemoglobin: 8 g/dL — ABNORMAL LOW (ref 13.0–17.0)
MCH: 30 pg (ref 26.0–34.0)
MCHC: 34.2 g/dL (ref 30.0–36.0)
MCV: 87.6 fL (ref 80.0–100.0)
Platelets: 235 10*3/uL (ref 150–400)
RBC: 2.67 MIL/uL — ABNORMAL LOW (ref 4.22–5.81)
RDW: 16.5 % — ABNORMAL HIGH (ref 11.5–15.5)
WBC: 8 10*3/uL (ref 4.0–10.5)
nRBC: 0 % (ref 0.0–0.2)

## 2022-09-22 LAB — COOXEMETRY PANEL
Carboxyhemoglobin: 3.9 % — ABNORMAL HIGH (ref 0.5–1.5)
Methemoglobin: 0.9 % (ref 0.0–1.5)
O2 Saturation: 72.8 %
Total hemoglobin: 8.3 g/dL — ABNORMAL LOW (ref 12.0–16.0)

## 2022-09-22 MED ORDER — POTASSIUM CHLORIDE CRYS ER 20 MEQ PO TBCR
60.0000 meq | EXTENDED_RELEASE_TABLET | Freq: Once | ORAL | Status: AC
  Administered 2022-09-22: 60 meq via ORAL
  Filled 2022-09-22: qty 3

## 2022-09-22 NOTE — Progress Notes (Addendum)
Patient ID: Ryan Garner, male   DOB: January 12, 1946, 77 y.o.   MRN: [id number]     Advanced Heart Failure Rounding Note  PCP-Cardiologist: Dorris Carnes, MD   Subjective:    2/26 acute abdominal pain>>CT A/P shows large left rectus sheath hematoma. Heparin d/ced. Lasix gtt stopped.  2/27 acute HF w/ acute worsening of dyspnea>>120 mg IV Lasix + BiPAP   Remains on dobutamine 2.5 for RV support + lasix drip 15 mg per hour.  Co-ox 73%  Diuresing well. Out 3.8L. WEight down another 5 pounds    Denies CP or SOB.     Objective:   Weight Range: 120.4 kg Body mass index is 40.36 kg/m.   Vital Signs:   Temp:  [97.6 F (36.4 C)-98.7 F (37.1 C)] 98.4 F (36.9 C) (03/02 0808) Pulse Rate:  [90-94] 90 (03/02 0808) Resp:  [18-19] 19 (03/02 0808) BP: (90-125)/(55-73) 90/55 (03/02 0808) SpO2:  [94 %-98 %] 94 % (03/02 0808) Weight:  [120.4 kg] 120.4 kg (03/02 0412) Last BM Date : 09/21/22  Weight change: Filed Weights   09/20/22 0400 09/21/22 0345 09/22/22 0412  Weight: 125.7 kg 122.6 kg 120.4 kg    Intake/Output:   Intake/Output Summary (Last 24 hours) at 09/22/2022 1251 Last data filed at 09/22/2022 0830 Gross per 24 hour  Intake 1271.7 ml  Output 3350 ml  Net -2078.3 ml     Physical Exam   General:  Sitting in chair No resp difficulty HEENT: normal Neck: supple. JVP 12-13 Carotids 2+ bilat; no bruits. No lymphadenopathy or thryomegaly appreciated. Cor: PMI nondisplaced. Irregular rate & rhythm. 3/6 MR. Lungs: clear Abdomen: obese soft, nontender, nondistended. No hepatosplenomegaly. No bruits or masses. Good bowel sounds. Extremities: no cyanosis, clubbing, rash, 2+ edema into thighs Neuro: alert & orientedx3, cranial nerves grossly intact. moves all 4 extremities w/o difficulty. Affect pleasant   Telemetry    A fib 80-90s Personally reviewed  Labs    CBC Recent Labs    09/21/22 0520 09/22/22 0500  WBC 8.2 8.0  HGB 8.2* 8.0*  HCT 24.1* 23.4*  MCV 88.3 87.6  PLT  208 [id number]    Basic Metabolic Panel Recent Labs    09/20/22 0350 09/21/22 0520 09/22/22 0500  NA 132* 131* 131*  K 3.7 3.2* 3.2*  CL 93* 89* 89*  CO2 32 30 32  GLUCOSE 99 107* 113*  BUN 46* 46* 40*  CREATININE 1.40* 1.30* 1.32*  CALCIUM 9.2 9.0 9.0  MG 2.0  --   --      BNP: BNP (last 3 results) Recent Labs    11/20/21 1409 04/06/22 1253 09/12/22 2101  BNP 485.0* 682.5* 577.3*     ProBNP (last 3 results) Recent Labs    08/15/22 1049 09/04/22 1404 09/11/22 1530  PROBNP 5,315* 11,525* 8,444*      Imaging   No results found.  Medications:     Scheduled Medications:  apixaban  5 mg Oral BID   atorvastatin  80 mg Oral Daily   Chlorhexidine Gluconate Cloth  6 each Topical Daily   midodrine  2.5 mg Oral TID WC   potassium chloride  60 mEq Oral Once   senna-docusate  1 tablet Oral Daily   sodium chloride flush  3 mL Intravenous Q12H   umeclidinium bromide  1 puff Inhalation Daily    Infusions:  sodium chloride     DOBUTamine 2.5 mcg/kg/min (09/21/22 1031)   furosemide (LASIX) 200 mg in dextrose 5 % 100 mL (2 mg/mL)  infusion 15 mg/hr (09/22/22 1029)    PRN Medications: sodium chloride, acetaminophen, albuterol, guaiFENesin-dextromethorphan, magnesium hydroxide, morphine injection, ondansetron (ZOFRAN) IV, oxyCODONE, sodium chloride flush  Patient Profile   Mr Spurbeck is 77 year old with a history of CAD, STEMI 2021, DES to LCx, permanent A fib, severe MR, HTN, HLD, sleep apnea, MR, chronic lymphedema, and HFpEF.    Admitted with A/C HFpEF.   Assessment/Plan   1. Acute on chronic HFpEF: With prominent RV dysfunction.  Echo this admission shows EF 50-55%, D-shaped septum, moderate-severe RV enlargement with moderate RV dysfunction, PASP 69, severe biatrial enlargement, moderate AS with mean gradient 21 mmHg/AVA 1.2 cm^2, IVC dilated. Diuretics held 2/26 w/ ABLA from rectus sheath hematoma. Had worsening HF/dyspnea>>120 mg IV Lasix + BiPAP. Lasix infusion  started 2/28. He remains on dobutamine 2.5 for RV support.   - Volume up  CVP 12-13 - Continue lasix gtt - Continue DBA for RV support  - Renal function stable.  - BP too low for GDMT. Continue midodrine for BP support   2. Mitral regurgitation: Severe MR by echo.  Mechanism uncertain, may be functional MR with severe LAE. Suspect this is etiology of pulmonary hypertension and RV failure.  - mTEER evaluation on hold given complicated hospital course  3. AKI on CKD 3: Suspect cardiorenal in etiology. Creatinine stable 1.32 today - Continue dobutamine as above.   4. Atrial fibrillation: Permanent, has been seen by EP in the past. Not likely to successfully cardiovert and stay in NSR.  - Eliquis restarted on 3/2 (hed for rectus sheath hematom from heparin)  5. CAD: No chest pain.  Had DES to mLCx in 10/21.   - Consider coronary angiography ?pre-Mitraclip once rectus sheath hematoma resolves.  - Continue atorvastatin.   6. LE wounds: Followed as outpatient by Dr. Sharol Given.  ABIs normal this admission.  - Wound care - Improved  7. Left Rectus Sheath Hematoma: 15.0 x 7.4 x 14.7 cm hematoma on CT 2/26.  -Hgb stable now at 8.3 , transfusion if drops below 7.5   8. Anemia, acute blood loss - in setting of rectus sheath hematoma.  - Hgb stabilized at 8.3  - rec'd IV iron  9. Hypokalemia - supp   Glori Bickers MD  09/22/2022 12:51 PM

## 2022-09-22 NOTE — Progress Notes (Signed)
Patient complained that his unna boot is "tight" on the ankle and claimed its painful. Educated and explained that we are not allowed to change it and let the ortho tech know. Checked his capillary and its less that 3 seconds and asked him to move his toes. Offered him tylenol which he agreed to take. Will continue to monitor.

## 2022-09-22 NOTE — Progress Notes (Signed)
Inpatient Rehab Admissions Coordinator:    I met with pt. To discuss potential Cir admit. He states that he prefers to d/c home with [facility], but would like to see how he does with therapy in the next few days before making a decision. States his cousin can provide 24/7 min A if needed. I will follow up with Pt. [place], [place], [place] Admissions Coordinator  [phone] ([place]) [phone] (office)

## 2022-09-22 NOTE — Progress Notes (Signed)
Potassium is 3.2. MD notified.

## 2022-09-23 DIAGNOSIS — I5033 Acute on chronic diastolic (congestive) heart failure: Secondary | ICD-10-CM | POA: Diagnosis not present

## 2022-09-23 LAB — BASIC METABOLIC PANEL
Anion gap: 10 (ref 5–15)
Anion gap: 10 (ref 5–15)
BUN: 40 mg/dL — ABNORMAL HIGH (ref 8–23)
BUN: 39 mg/dL — ABNORMAL HIGH (ref 8–23)
CO2: 31 mmol/L (ref 22–32)
CO2: 30 mmol/L (ref 22–32)
Calcium: 8.6 mg/dL — ABNORMAL LOW (ref 8.9–10.3)
Calcium: 8.7 mg/dL — ABNORMAL LOW (ref 8.9–10.3)
Chloride: 91 mmol/L — ABNORMAL LOW (ref 98–111)
Chloride: 92 mmol/L — ABNORMAL LOW (ref 98–111)
Creatinine, Ser: 1.41 mg/dL — ABNORMAL HIGH (ref 0.61–1.24)
Creatinine, Ser: 1.35 mg/dL — ABNORMAL HIGH (ref 0.61–1.24)
GFR, Estimated: 51 mL/min — ABNORMAL LOW (ref >60)
GFR, Estimated: 54 mL/min — ABNORMAL LOW (ref >60)
Glucose, Bld: 109 mg/dL — ABNORMAL HIGH (ref 70–99)
Glucose, Bld: 105 mg/dL — ABNORMAL HIGH (ref 70–99)
Potassium: 3.3 mmol/L — ABNORMAL LOW (ref 3.5–5.1)
Potassium: 3.2 mmol/L — ABNORMAL LOW (ref 3.5–5.1)
Sodium: 132 mmol/L — ABNORMAL LOW (ref 135–145)
Sodium: 132 mmol/L — ABNORMAL LOW (ref 135–145)

## 2022-09-23 LAB — CBC
HCT: 22.6 % — ABNORMAL LOW (ref 39.0–52.0)
HCT: 22.8 % — ABNORMAL LOW (ref 39.0–52.0)
Hemoglobin: 7.6 g/dL — ABNORMAL LOW (ref 13.0–17.0)
Hemoglobin: 7.6 g/dL — ABNORMAL LOW (ref 13.0–17.0)
MCH: 29.7 pg (ref 26.0–34.0)
MCH: 29.8 pg (ref 26.0–34.0)
MCHC: 33.6 g/dL (ref 30.0–36.0)
MCHC: 33.3 g/dL (ref 30.0–36.0)
MCV: 88.3 fL (ref 80.0–100.0)
MCV: 89.4 fL (ref 80.0–100.0)
Platelets: 241 10*3/uL (ref 150–400)
Platelets: 251 10*3/uL (ref 150–400)
RBC: 2.56 MIL/uL — ABNORMAL LOW (ref 4.22–5.81)
RBC: 2.55 MIL/uL — ABNORMAL LOW (ref 4.22–5.81)
RDW: 16.8 % — ABNORMAL HIGH (ref 11.5–15.5)
RDW: 16.8 % — ABNORMAL HIGH (ref 11.5–15.5)
WBC: 7.2 10*3/uL (ref 4.0–10.5)
WBC: 7 10*3/uL (ref 4.0–10.5)
nRBC: 0 % (ref 0.0–0.2)
nRBC: 0 % (ref 0.0–0.2)

## 2022-09-23 LAB — COOXEMETRY PANEL
Carboxyhemoglobin: 3.6 % — ABNORMAL HIGH (ref 0.5–1.5)
Methemoglobin: <0.7 % (ref 0.0–1.5)
O2 Saturation: 69.8 %
Total hemoglobin: 8.1 g/dL — ABNORMAL LOW (ref 12.0–16.0)

## 2022-09-23 LAB — GLUCOSE, CAPILLARY: Glucose-Capillary: 125 mg/dL — ABNORMAL HIGH (ref 70–99)

## 2022-09-23 MED ORDER — POTASSIUM CHLORIDE CRYS ER 20 MEQ PO TBCR
60.0000 meq | EXTENDED_RELEASE_TABLET | ORAL | Status: AC
  Administered 2022-09-23 (×2): 60 meq via ORAL
  Filled 2022-09-23 (×2): qty 3

## 2022-09-23 MED ORDER — METOLAZONE 5 MG PO TABS
5.0000 mg | ORAL_TABLET | Freq: Once | ORAL | Status: AC
  Administered 2022-09-23: 5 mg via ORAL
  Filled 2022-09-23: qty 1

## 2022-09-23 NOTE — Progress Notes (Signed)
Patient ID: Ryan Garner, male   DOB: [birth year], 77 y.o.   MRN: [id number]     Advanced Heart Failure Rounding Note  PCP-Cardiologist: Dorris Carnes, MD   Subjective:    2/26 acute abdominal pain>>CT A/P shows large left rectus sheath hematoma. Heparin d/ced. Lasix gtt stopped.  2/27 acute HF w/ acute worsening of dyspnea>>120 mg IV Lasix + BiPAP   Remains on dobutamine 2.5 for RV support + lasix drip 15 mg per hour.  Co-ox 70%  Diuresing well. Out 3.5L but weight unchanged. CVP 13  Denies, CP, SOB, orthopnea or PND     Objective:   Weight Range: 120.6 kg Body mass index is 40.43 kg/m.   Vital Signs:   Temp:  [97.4 F (36.3 C)-98.7 F (37.1 C)] 98.7 F (37.1 C) (03/03 1153) Pulse Rate:  [87-99] 90 (03/03 1153) Resp:  [18-22] 20 (03/03 1153) BP: (84-105)/(58-88) 84/60 (03/03 1153) SpO2:  [94 %-98 %] 97 % (03/03 1153) Weight:  [120.6 kg] 120.6 kg (03/03 0428) Last BM Date : 09/22/22  Weight change: Filed Weights   09/21/22 0345 09/22/22 0412 09/23/22 0428  Weight: 122.6 kg 120.4 kg 120.6 kg    Intake/Output:   Intake/Output Summary (Last 24 hours) at 09/23/2022 1158 Last data filed at 09/23/2022 0820 Gross per 24 hour  Intake 1660.6 ml  Output 3200 ml  Net -1539.4 ml     Physical Exam   General:  Sitting in chair No resp difficulty HEENT: normal Neck: supple. JVP to jaw  Carotids 2+ bilat; no bruits. No lymphadenopathy or thryomegaly appreciated. Cor: PMI nondisplaced. Irregular . 2/6 Ryan Lungs: clear Abdomen: obese soft, nontender, nondistended. No hepatosplenomegaly. No bruits or masses. Good bowel sounds. Extremities: no cyanosis, clubbing, rash, 2-3+ edema into thighs Neuro: alert & orientedx3, cranial nerves grossly intact. moves all 4 extremities w/o difficulty. Affect pleasant    Telemetry    A fib 80-90s Personally reviewed  Labs    CBC Recent Labs    09/23/22 0440 09/23/22 0530  WBC 7.2 7.0  HGB 7.6* 7.6*  HCT 22.6* 22.8*  MCV 88.3 89.4   PLT 241 [id number]    Basic Metabolic Panel Recent Labs    09/23/22 0440 09/23/22 0530  NA 132* 132*  K 3.3* 3.2*  CL 91* 92*  CO2 31 30  GLUCOSE 109* 105*  BUN 40* 39*  CREATININE 1.41* 1.35*  CALCIUM 8.6* 8.7*     BNP: BNP (last 3 results) Recent Labs    11/20/21 1409 04/06/22 1253 09/12/22 2101  BNP 485.0* 682.5* 577.3*     ProBNP (last 3 results) Recent Labs    08/15/22 1049 09/04/22 1404 09/11/22 1530  PROBNP 5,315* 11,525* 8,444*      Imaging   No results found.  Medications:     Scheduled Medications:  apixaban  5 mg Oral BID   atorvastatin  80 mg Oral Daily   Chlorhexidine Gluconate Cloth  6 each Topical Daily   midodrine  2.5 mg Oral TID WC   potassium chloride  60 mEq Oral Q4H   senna-docusate  1 tablet Oral Daily   sodium chloride flush  3 mL Intravenous Q12H   umeclidinium bromide  1 puff Inhalation Daily    Infusions:  sodium chloride     DOBUTamine 2.5 mcg/kg/min (09/23/22 0459)   furosemide (LASIX) 200 mg in dextrose 5 % 100 mL (2 mg/mL) infusion 15 mg/hr (09/23/22 0459)    PRN Medications: sodium chloride, acetaminophen, albuterol, guaiFENesin-dextromethorphan, magnesium hydroxide, morphine  injection, ondansetron (ZOFRAN) IV, oxyCODONE, sodium chloride flush  Patient Profile   Ryan Garner is 77 year old with a history of CAD, STEMI 2021, DES to LCx, permanent A fib, severe Ryan, HTN, HLD, sleep apnea, Ryan, chronic lymphedema, and HFpEF.    Admitted with A/C HFpEF.   Assessment/Plan   1. Acute on chronic HFpEF: With prominent RV dysfunction.  Echo this admission shows EF 50-55%, D-shaped septum, moderate-severe RV enlargement with moderate RV dysfunction, PASP 69, severe biatrial enlargement, moderate AS with mean gradient 21 mmHg/AVA 1.2 cm^2, IVC dilated. Diuretics held 2/26 w/ ABLA from rectus sheath hematoma. Had worsening HF/dyspnea>>120 mg IV Lasix + BiPAP. Lasix infusion started 2/28. He remains on dobutamine 2.5 for RV support.    - Volume status remains elevated CVP 12-13 - Increase lasix gtt 15 -> 20. Give metolazone '5mg'$  today - Continue DBA for RV support  - Renal function stable.  - BP too low for GDMT. Continue midodrine for BP support   2. Mitral regurgitation: Severe Ryan by echo.  Mechanism uncertain, may be functional Ryan with severe LAE. Suspect this is etiology of pulmonary hypertension and RV failure.  - mTEER evaluation on hold given complicated hospital course  3. AKI on CKD 3: Suspect cardiorenal in etiology. Creatinine stable 1.35 today - Continue dobutamine as above.   4. Atrial fibrillation: Permanent, has been seen by EP in the past. Not likely to successfully cardiovert and stay in NSR.  - Eliquis restarted on 3/2 (hed for rectus sheath hematom from heparin)  5. CAD: No chest pain.  Had DES to mLCx in 10/21.   - Consider coronary angiography ?pre-Mitraclip once rectus sheath hematoma resolves.  - Continue atorvastatin.   6. LE wounds: Followed as outpatient by Dr. Sharol Given.  ABIs normal this admission.  - Wound care - Improved  7. Left Rectus Sheath Hematoma: 15.0 x 7.4 x 14.7 cm hematoma on CT 2/26.  -Hgb stable now at 8.3 , transfusion if drops below 7.5   8. Anemia, acute blood loss - in setting of rectus sheath hematoma.  - Hgb stabilized at 7.6 - rec'd IV iron  9. Hypokalemia - supp   Glori Bickers MD  09/23/2022 11:58 AM

## 2022-09-24 DIAGNOSIS — I5033 Acute on chronic diastolic (congestive) heart failure: Secondary | ICD-10-CM | POA: Diagnosis not present

## 2022-09-24 LAB — CBC
HCT: 24.1 % — ABNORMAL LOW (ref 39.0–52.0)
Hemoglobin: 7.8 g/dL — ABNORMAL LOW (ref 13.0–17.0)
MCH: 29.3 pg (ref 26.0–34.0)
MCHC: 32.4 g/dL (ref 30.0–36.0)
MCV: 90.6 fL (ref 80.0–100.0)
Platelets: 279 10*3/uL (ref 150–400)
RBC: 2.66 MIL/uL — ABNORMAL LOW (ref 4.22–5.81)
RDW: 16.9 % — ABNORMAL HIGH (ref 11.5–15.5)
WBC: 6.8 10*3/uL (ref 4.0–10.5)
nRBC: 0 % (ref 0.0–0.2)

## 2022-09-24 LAB — BASIC METABOLIC PANEL
Anion gap: 11 (ref 5–15)
BUN: 39 mg/dL — ABNORMAL HIGH (ref 8–23)
CO2: 33 mmol/L — ABNORMAL HIGH (ref 22–32)
Calcium: 9.2 mg/dL (ref 8.9–10.3)
Chloride: 88 mmol/L — ABNORMAL LOW (ref 98–111)
Creatinine, Ser: 1.39 mg/dL — ABNORMAL HIGH (ref 0.61–1.24)
GFR, Estimated: 52 mL/min — ABNORMAL LOW (ref >60)
Glucose, Bld: 111 mg/dL — ABNORMAL HIGH (ref 70–99)
Potassium: 3.4 mmol/L — ABNORMAL LOW (ref 3.5–5.1)
Sodium: 132 mmol/L — ABNORMAL LOW (ref 135–145)

## 2022-09-24 LAB — COOXEMETRY PANEL
Carboxyhemoglobin: 3.1 % — ABNORMAL HIGH (ref 0.5–1.5)
Methemoglobin: 0.9 % (ref 0.0–1.5)
O2 Saturation: 65.8 %
Total hemoglobin: 8.6 g/dL — ABNORMAL LOW (ref 12.0–16.0)

## 2022-09-24 MED ORDER — IRON SUCROSE 500 MG IVPB - SIMPLE MED
500.0000 mg | Freq: Once | INTRAVENOUS | Status: AC
  Administered 2022-09-24: 500 mg via INTRAVENOUS
  Filled 2022-09-24: qty 275

## 2022-09-24 MED ORDER — METOLAZONE 5 MG PO TABS
5.0000 mg | ORAL_TABLET | Freq: Once | ORAL | Status: AC
  Administered 2022-09-24: 5 mg via ORAL
  Filled 2022-09-24: qty 1

## 2022-09-24 MED ORDER — POTASSIUM CHLORIDE CRYS ER 20 MEQ PO TBCR
60.0000 meq | EXTENDED_RELEASE_TABLET | Freq: Once | ORAL | Status: AC
  Administered 2022-09-24: 60 meq via ORAL
  Filled 2022-09-24: qty 3

## 2022-09-24 NOTE — Progress Notes (Signed)
Physical Therapy Treatment Patient Details Name: Ryan Garner MRN: [id number] DOB: 05-Feb-1946 Today's Date: 09/24/2022   History of Present Illness Pt is a 77 y.o. M who presents 09/12/2022 with A/C HFpEF. 2/26 acute abdominal pain>>CT A/P shows large left rectus sheath hematoma.Significant PMH: CAD, STEMI 2021, DES to LCx, permanent A fib, severe MR, HTN, HLD, sleep apnea, DM, rotator cuff repair, MR, chronic lymphedema, and HFpEF.    PT Comments    Pt tolerated treatment well today. Pt was able to progress ambulation in the hallway today with a chair follow for safety. No change in DC/DME recs. Pt would benefit from continued gait training during acute stay.   Recommendations for follow up therapy are one component of a multi-disciplinary discharge planning process, led by the attending physician.  Recommendations may be updated based on patient status, additional functional criteria and insurance authorization.  Follow Up Recommendations  Acute inpatient rehab (3hours/day) Can patient physically be transported by private vehicle: No   Assistance Recommended at Discharge Frequent or constant Supervision/Assistance  Patient can return home with the following A lot of help with walking and/or transfers;Assistance with cooking/housework;Help with stairs or ramp for entrance;Assist for transportation   Equipment Recommendations  Other (comment) (per accepting facility)    Recommendations for Other Services       Precautions / Restrictions Precautions Precautions: Fall Restrictions Weight Bearing Restrictions: No     Mobility  Bed Mobility               General bed mobility comments: Up in chair    Transfers Overall transfer level: Needs assistance Equipment used: Rolling walker (2 wheels) Transfers: Sit to/from Stand Sit to Stand: Min guard                Ambulation/Gait Ambulation/Gait assistance: Min guard, +2 safety/equipment Gait Distance (Feet): 150 Feet  (6f x2) Assistive device: Rolling walker (2 wheels) Gait Pattern/deviations: Step-through pattern, Decreased stride length Gait velocity: decreased     General Gait Details: Pt required 1 seated rest break with chair follow   Stairs             Wheelchair Mobility    Modified Rankin (Stroke Patients Only)       Balance Overall balance assessment: Mild deficits observed, not formally tested                                          Cognition Arousal/Alertness: Awake/alert Behavior During Therapy: Flat affect Overall Cognitive Status: Within Functional Limits for tasks assessed                                 General Comments: Pt with flat affect. Doesnt talk very much        Exercises      General Comments General comments (skin integrity, edema, etc.): VSS on RA. HR up to 110 during ambulation      Pertinent Vitals/Pain Pain Assessment Pain Assessment: Faces Faces Pain Scale: Hurts a little bit Pain Location: L arm Pain Descriptors / Indicators: Constant, Guarding, Grimacing Pain Intervention(s): Monitored during session    Home Living                          Prior Function  PT Goals (current goals can now be found in the care plan section) Progress towards PT goals: Progressing toward goals    Frequency    Min 3X/week      PT Plan Current plan remains appropriate    Co-evaluation              AM-PAC PT "6 Clicks" Mobility   Outcome Measure  Help needed turning from your back to your side while in a flat bed without using bedrails?: A Lot Help needed moving from lying on your back to sitting on the side of a flat bed without using bedrails?: A Lot Help needed moving to and from a bed to a chair (including a wheelchair)?: A Little Help needed standing up from a chair using your arms (e.g., wheelchair or bedside chair)?: A Little Help needed to walk in hospital room?: A  Little Help needed climbing 3-5 steps with a railing? : Total 6 Click Score: 14    End of Session Equipment Utilized During Treatment: Gait belt Activity Tolerance: Patient tolerated treatment well Patient left: in chair;with call bell/phone within reach;with chair alarm set Nurse Communication: Mobility status PT Visit Diagnosis: Muscle weakness (generalized) (M62.81);Difficulty in walking, not elsewhere classified (R26.2)     Time: [id number] PT Time Calculation (min) (ACUTE ONLY): 17 min  Charges:  $Gait Training: 8-22 mins                     Shelby Mattocks, PT, DPT Acute Rehab Services [id number]    Viann Shove 09/24/2022, 11:49 AM

## 2022-09-24 NOTE — Progress Notes (Signed)
Inpatient Rehab Admissions Coordinator:   Pt. Is now amenable to CIR. States nephew Sonia Side can provide 24/7 supervision at d/c. Not yet medically ready, but I will follow for medical readiness.  Clemens Catholic, [place], [facility] Admissions Coordinator  [phone] ([place]) [phone] (office)

## 2022-09-24 NOTE — Progress Notes (Signed)
Patient ID: Ryan Garner, male   DOB: September 28, 1945, 77 y.o.   MRN: [id number]     Advanced Heart Failure Rounding Note  PCP-Cardiologist: Dorris Carnes, MD   Subjective:    2/26 acute abdominal pain>>CT A/P shows large left rectus sheath hematoma. Heparin d/ced. Lasix gtt stopped.  2/27 acute HF w/ acute worsening of dyspnea>>120 mg IV Lasix + BiPAP  3.3 Lasix drip increased to '20mg'$  per hour and given metolazone.   Remains on dobutamine 2.5 for RV support + lasix drip 20 mg per hour.  Brisk diuresis noted. Negative 3.2 liters.  Denies SOB.Denies pain.    Objective:   Weight Range: 116.9 kg Body mass index is 39.19 kg/m.   Vital Signs:   Temp:  [97.9 F (36.6 C)-98.7 F (37.1 C)] 98 F (36.7 C) (03/04 0809) Pulse Rate:  [85-91] 91 (03/04 0809) Resp:  [18-20] 18 (03/04 0809) BP: (84-145)/(51-69) 145/51 (03/04 0809) SpO2:  [94 %-97 %] 94 % (03/04 0809) Weight:  [116.9 kg] 116.9 kg (03/04 0444) Last BM Date : 09/23/22  Weight change: Filed Weights   09/22/22 0412 09/23/22 0428 09/24/22 0444  Weight: 120.4 kg 120.6 kg 116.9 kg    Intake/Output:   Intake/Output Summary (Last 24 hours) at 09/24/2022 0844 Last data filed at 09/24/2022 0817 Gross per 24 hour  Intake 785.53 ml  Output 4300 ml  Net -3514.47 ml  CVP 17-18   Physical Exam  General:Sitting in the chair.  No resp difficulty HEENT: normal Neck: supple. no JVD. Carotids 2+ bilat; no bruits. No lymphadenopathy or thryomegaly appreciated. Cor: PMI nondisplaced. Irregular rate & rhythm. No rubs, gallops. LLSB 2/6. Lungs: Coarse throughout on room iar. Abdomen: soft, nontender, nondistended. No hepatosplenomegaly. No bruits or masses. Good bowel sounds. Extremities: no cyanosis, clubbing, rash, R and LLE 2+ edema Neuro: alert & orientedx3, cranial nerves grossly intact. moves all 4 extremities w/o difficulty. Affect pleasant    Telemetry    A Fib80-90s  Labs    CBC Recent Labs    09/23/22 0530 09/24/22 0450   WBC 7.0 6.8  HGB 7.6* 7.8*  HCT 22.8* 24.1*  MCV 89.4 90.6  PLT 251 [id number]   Basic Metabolic Panel Recent Labs    09/23/22 0530 09/24/22 0450  NA 132* 132*  K 3.2* 3.4*  CL 92* 88*  CO2 30 33*  GLUCOSE 105* 111*  BUN 39* 39*  CREATININE 1.35* 1.39*  CALCIUM 8.7* 9.2    BNP: BNP (last 3 results) Recent Labs    11/20/21 1409 04/06/22 1253 09/12/22 2101  BNP 485.0* 682.5* 577.3*    ProBNP (last 3 results) Recent Labs    08/15/22 1049 09/04/22 1404 09/11/22 1530  PROBNP 5,315* 11,525* 8,444*     Imaging   No results found.  Medications:     Scheduled Medications:  apixaban  5 mg Oral BID   atorvastatin  80 mg Oral Daily   Chlorhexidine Gluconate Cloth  6 each Topical Daily   midodrine  2.5 mg Oral TID WC   potassium chloride  60 mEq Oral Once   senna-docusate  1 tablet Oral Daily   sodium chloride flush  3 mL Intravenous Q12H   umeclidinium bromide  1 puff Inhalation Daily    Infusions:  sodium chloride     DOBUTamine 2.5 mcg/kg/min (09/24/22 0526)   furosemide (LASIX) 200 mg in dextrose 5 % 100 mL (2 mg/mL) infusion 20 mg/hr (09/24/22 0526)    PRN Medications: sodium chloride, acetaminophen, albuterol, guaiFENesin-dextromethorphan, magnesium  hydroxide, morphine injection, ondansetron (ZOFRAN) IV, oxyCODONE, sodium chloride flush  Patient Profile   Mr Kosak is 77 year old with a history of CAD, STEMI 2021, DES to LCx, permanent A fib, severe MR, HTN, HLD, sleep apnea, MR, chronic lymphedema, and HFpEF.    Admitted with A/C HFpEF.   Assessment/Plan   1. Acute on chronic HFpEF: With prominent RV dysfunction.  Echo this admission shows EF 50-55%, D-shaped septum, moderate-severe RV enlargement with moderate RV dysfunction, PASP 69, severe biatrial enlargement, moderate AS with mean gradient 21 mmHg/AVA 1.2 cm^2, IVC dilated. Diuretics held 2/26 w/ ABLA from rectus sheath hematoma. Had worsening HF/dyspnea>>120 mg IV Lasix + BiPAP. Lasix infusion  started 2/28. He remains on dobutamine 2.5 for RV support.   - CVP 18 in the chair. Needs additional diuresis.  - Continue lasix gtt  20 mg per hour. Give another dose of  metolazone '5mg'$  today. Renal function stable.  - Continue DBA for RV support  - Renal function stable.  - BP too low for GDMT. Continue midodrine for BP support   2. Mitral regurgitation: Severe MR by echo.  Mechanism uncertain, may be functional MR with severe LAE. Suspect this is etiology of pulmonary hypertension and RV failure.  - mTEER evaluation on hold given complicated hospital course  3. AKI on CKD 3: Suspect cardiorenal in etiology. Creatinine stable 1.39 today - Continue dobutamine as above.   4. Atrial fibrillation: Permanent, has been seen by EP in the past. Not likely to successfully cardiovert and stay in NSR.  - Rate controlled.  - Eliquis restarted on 3/2 (hed for rectus sheath hematom from heparin)  5. CAD:   Had DES to mLCx in 10/21.   - Consider coronary angiography ?pre-Mitraclip once rectus sheath hematoma resolves.  -No chest pain.  - Continue atorvastatin + eliquis.   6. LE wounds: Followed as outpatient by Dr. Sharol Given.  ABIs normal this admission.  - Wound care - Improved  7. Left Rectus Sheath Hematoma: 15.0 x 7.4 x 14.7 cm hematoma on CT 2/26.  -Hgb stable now at 7.8., transfusion if drops below 7.5   8. Anemia, acute blood loss - in setting of rectus sheath hematoma.  - Hgb stabilized at 7.8 - rec'd IV iron  9. Hypokalemia - 3.4  -Supp K    Continue to mobilize. Once off DBA consider CIR.   Kaimana Neuzil NP-C  09/24/2022 8:44 AM

## 2022-09-24 NOTE — Progress Notes (Signed)
Ortho Boots removed today 09/24/2022  Ortho tech paged and returned call stating they would be here shortly. Did not come, paged again with no reply.   Alvis Lemmings, RN 09/24/2022 6:34 PM

## 2022-09-25 DIAGNOSIS — I5033 Acute on chronic diastolic (congestive) heart failure: Secondary | ICD-10-CM | POA: Diagnosis not present

## 2022-09-25 LAB — MAGNESIUM: Magnesium: 1.5 mg/dL — ABNORMAL LOW (ref 1.7–2.4)

## 2022-09-25 LAB — BASIC METABOLIC PANEL
Anion gap: 14 (ref 5–15)
BUN: 38 mg/dL — ABNORMAL HIGH (ref 8–23)
CO2: 31 mmol/L (ref 22–32)
Calcium: 9.3 mg/dL (ref 8.9–10.3)
Chloride: 88 mmol/L — ABNORMAL LOW (ref 98–111)
Creatinine, Ser: 1.5 mg/dL — ABNORMAL HIGH (ref 0.61–1.24)
GFR, Estimated: 47 mL/min — ABNORMAL LOW (ref >60)
Glucose, Bld: 163 mg/dL — ABNORMAL HIGH (ref 70–99)
Potassium: 3.2 mmol/L — ABNORMAL LOW (ref 3.5–5.1)
Sodium: 133 mmol/L — ABNORMAL LOW (ref 135–145)

## 2022-09-25 LAB — COOXEMETRY PANEL
Carboxyhemoglobin: 3.3 % — ABNORMAL HIGH (ref 0.5–1.5)
Methemoglobin: <0.7 % (ref 0.0–1.5)
O2 Saturation: 62.7 %
Total hemoglobin: 8.6 g/dL — ABNORMAL LOW (ref 12.0–16.0)

## 2022-09-25 MED ORDER — POTASSIUM CHLORIDE CRYS ER 20 MEQ PO TBCR
60.0000 meq | EXTENDED_RELEASE_TABLET | Freq: Once | ORAL | Status: AC
  Administered 2022-09-25: 60 meq via ORAL
  Filled 2022-09-25: qty 3

## 2022-09-25 MED ORDER — MAGNESIUM SULFATE 4 GM/100ML IV SOLN
4.0000 g | Freq: Once | INTRAVENOUS | Status: AC
  Administered 2022-09-25: 4 g via INTRAVENOUS
  Filled 2022-09-25 (×2): qty 100

## 2022-09-25 NOTE — Progress Notes (Signed)
Inpatient Rehab Admissions Coordinator:   CIR following. Per Dr. Haroldine Laws, Pt. Will need to remain on acute likely until the end of the week. Pt. Already min guard with therapies so may not continue to require the intensity of CIR by the time he's medically ready to admit. I discussed this with Pt. And he stated understanding. I will continue to follow and re-assess needs once medically stable.   Clemens Catholic, [place], [place] Admissions Coordinator  [phone] ([place]) [phone] (office)

## 2022-09-25 NOTE — Progress Notes (Signed)
Orthopedic Tech Progress Note Patient Details:  Ryan Garner 11/04/1945 [id number]  Ortho Devices Type of Ortho Device: Haematologist Ortho Device/Splint Location: BLE Ortho Device/Splint Interventions: Ordered, Application  Dressing applied to 3 small areas on the LLE which nursing is aware of and provided the small dressings to lay underneath the unna paste and coban.  Post Interventions Patient Tolerated: Well Instructions Provided: Care of device  Quanta Robertshaw OTR/L 09/25/2022, 12:30 PM

## 2022-09-25 NOTE — Progress Notes (Signed)
Mobility Specialist - Progress Note   09/25/22 1400  Mobility  Activity Ambulated with assistance in hallway  Level of Assistance Contact guard assist, steadying assist  Assistive Device Four wheel walker  Distance Ambulated (ft) 150 ft  Activity Response Tolerated well  Mobility Referral Yes  $Mobility charge 1 Mobility    Pt received in recliner and agreeable. Took seated break x1 on rollator in hallway. No complaints during walk. Left in recliner w/ call bell in reach and all needs met.  [place] Specialist Please contact via SecureChat or Rehab office at [phone]

## 2022-09-25 NOTE — Progress Notes (Signed)
Patient ID: Ryan Garner, male   DOB: 1946-07-11, 77 y.o.   MRN: [id number]     Advanced Heart Failure Rounding Note  PCP-Cardiologist: Ryan Carnes, MD   Subjective:    2/26 acute abdominal pain>>CT A/P shows large left rectus sheath hematoma. Heparin d/ced. Lasix gtt stopped.  2/27 acute HF w/ acute worsening of dyspnea>>120 mg IV Lasix + BiPAP  3.3 Lasix drip increased to '20mg'$  per hour and Garner metolazone.   Remains on dobutamine 2.5 for RV support + lasix drip 20 mg per hour.  Brisk diuresis noted. Negative 5.1 liters. CVP 12  Feels good this morning. Up in chair, his pastor is visiting him. Denies CP/SOB.    Objective:   Weight Range: 116.4 kg Body mass index is 39.02 kg/m.   Vital Signs:   Temp:  [97.7 F (36.5 C)-98.5 F (36.9 C)] 97.9 F (36.6 C) (03/05 0724) Pulse Rate:  [82-91] 91 (03/05 0724) Resp:  [18] 18 (03/05 0724) BP: (99-113)/(53-68) 103/53 (03/05 0724) SpO2:  [90 %-96 %] 90 % (03/05 0724) Weight:  [116.4 kg] 116.4 kg (03/05 0041) Last BM Date : 09/24/22  Weight change: Filed Weights   09/23/22 0428 09/24/22 0444 09/25/22 0041  Weight: 120.6 kg 116.9 kg 116.4 kg    Intake/Output:   Intake/Output Summary (Last 24 hours) at 09/25/2022 0856 Last data filed at 09/25/2022 0848 Gross per 24 hour  Intake 1670.59 ml  Output 4400 ml  Net -2729.41 ml  CVP 12 Physical Exam  General:  well appearing.  No respiratory difficulty HEENT: normal Neck: supple. JVD ~12 cm. Carotids 2+ bilat; no bruits. No lymphadenopathy or thyromegaly appreciated. Cor: PMI nondisplaced. Regular rate & irregular rhythm. No rubs, gallops or murmurs. Lungs: clear Abdomen: obese, soft, nontender, nondistended. No hepatosplenomegaly. No bruits or masses. Good bowel sounds. Extremities: no cyanosis, clubbing, rash, +1 BLE edema. + UNNA boots. PICC RUE Neuro: alert & oriented x 3, cranial nerves grossly intact. moves all 4 extremities w/o difficulty. Affect pleasant.  Telemetry    A  Fib 90s (Personally reviewed)    Labs  CBC Recent Labs    09/23/22 0530 09/24/22 0450  WBC 7.0 6.8  HGB 7.6* 7.8*  HCT 22.8* 24.1*  MCV 89.4 90.6  PLT 251 [id number]   Basic Metabolic Panel Recent Labs    09/23/22 0530 09/24/22 0450 09/25/22 0440  NA 132* 132*  --   K 3.2* 3.4*  --   CL 92* 88*  --   CO2 30 33*  --   GLUCOSE 105* 111*  --   BUN 39* 39*  --   CREATININE 1.35* 1.39*  --   CALCIUM 8.7* 9.2  --   MG  --   --  1.5*   BNP: BNP (last 3 results) Recent Labs    11/20/21 1409 04/06/22 1253 09/12/22 2101  BNP 485.0* 682.5* 577.3*   ProBNP (last 3 results) Recent Labs    08/15/22 1049 09/04/22 1404 09/11/22 1530  PROBNP 5,315* 11,525* 8,444*   Imaging   No results found.  Medications:    Scheduled Medications:  apixaban  5 mg Oral BID   atorvastatin  80 mg Oral Daily   Chlorhexidine Gluconate Cloth  6 each Topical Daily   midodrine  2.5 mg Oral TID WC   senna-docusate  1 tablet Oral Daily   sodium chloride flush  3 mL Intravenous Q12H   umeclidinium bromide  1 puff Inhalation Daily   Infusions:  sodium chloride  DOBUTamine 2.5 mcg/kg/min (09/25/22 [id number])   furosemide (LASIX) 200 mg in dextrose 5 % 100 mL (2 mg/mL) infusion 20 mg/hr (09/25/22 0605)   magnesium sulfate bolus IVPB     PRN Medications: sodium chloride, acetaminophen, albuterol, guaiFENesin-dextromethorphan, magnesium hydroxide, morphine injection, ondansetron (ZOFRAN) IV, oxyCODONE, sodium chloride flush  Patient Profile   Mr Ryan Garner is 77 year old with a history of CAD, STEMI 2021, DES to LCx, permanent A fib, severe MR, HTN, HLD, sleep apnea, MR, chronic lymphedema, and HFpEF.    Admitted with A/C HFpEF.   Assessment/Plan  1. Acute on chronic HFpEF: With prominent RV dysfunction.  Echo this admission shows EF 50-55%, D-shaped septum, moderate-severe RV enlargement with moderate RV dysfunction, PASP 69, severe biatrial enlargement, moderate AS with mean gradient 21 mmHg/AVA  1.2 cm^2, IVC dilated. Diuretics held 2/26 w/ ABLA from rectus sheath hematoma. Had worsening HF/dyspnea>>120 mg IV Lasix + BiPAP. Lasix infusion started 2/28. He remains on dobutamine 2.5 for RV support.   - CVP 12 in the chair. Needs additional diuresis.  - Continue lasix gtt  20 mg per hour. No metolazone today. BMET pending.  - Continue DBA for RV support  - Renal function stable.  - BP too low for GDMT. Continue midodrine for BP support   2. Mitral regurgitation: Severe MR by echo.  Mechanism uncertain, may be functional MR with severe LAE. Suspect this is etiology of pulmonary hypertension and RV failure.  - mTEER evaluation on hold Garner complicated hospital course  3. AKI on CKD 3: Suspect cardiorenal in etiology. Creatinine stable 1.39 today - Continue dobutamine as above.   4. Atrial fibrillation: Permanent, has been seen by EP in the past. Not likely to successfully cardiovert and stay in NSR.  - Rate controlled.  - Eliquis restarted on 3/2 (held for rectus sheath hematom from heparin)  5. CAD:   Had DES to mLCx in 10/21.   - Consider coronary angiography ?pre-Mitraclip once rectus sheath hematoma resolves.  -No chest pain.  - Continue atorvastatin + eliquis.   6. LE wounds: Followed as outpatient by Dr. Sharol Garner.  ABIs normal this admission.  - Wound care - Improved  7. Left Rectus Sheath Hematoma: 15.0 x 7.4 x 14.7 cm hematoma on CT 2/26.  -Hgb stable now at 7.8., transfusion if drops below 7.5   8. Anemia, acute blood loss - in setting of rectus sheath hematoma.  - Hgb stabilized at 7.8 - rec'd IV iron  9. Hypokalemia - 3.4, pending today -Supp K  Continue to mobilize. Once off DBA consider CIR.   Ryan Garner AGACNP-BC   09/25/2022 8:56 AM

## 2022-09-26 DIAGNOSIS — I5033 Acute on chronic diastolic (congestive) heart failure: Secondary | ICD-10-CM | POA: Diagnosis not present

## 2022-09-26 LAB — BASIC METABOLIC PANEL
Anion gap: 16 — ABNORMAL HIGH (ref 5–15)
BUN: 41 mg/dL — ABNORMAL HIGH (ref 8–23)
CO2: 27 mmol/L (ref 22–32)
Calcium: 9.5 mg/dL (ref 8.9–10.3)
Chloride: 90 mmol/L — ABNORMAL LOW (ref 98–111)
Creatinine, Ser: 1.45 mg/dL — ABNORMAL HIGH (ref 0.61–1.24)
GFR, Estimated: 49 mL/min — ABNORMAL LOW (ref >60)
Glucose, Bld: 111 mg/dL — ABNORMAL HIGH (ref 70–99)
Potassium: 3.5 mmol/L (ref 3.5–5.1)
Sodium: 133 mmol/L — ABNORMAL LOW (ref 135–145)

## 2022-09-26 LAB — COOXEMETRY PANEL
Carboxyhemoglobin: 3.5 % — ABNORMAL HIGH (ref 0.5–1.5)
Methemoglobin: <0.7 % (ref 0.0–1.5)
O2 Saturation: 63.6 %
Total hemoglobin: 8.6 g/dL — ABNORMAL LOW (ref 12.0–16.0)

## 2022-09-26 LAB — MAGNESIUM: Magnesium: 2 mg/dL (ref 1.7–2.4)

## 2022-09-26 MED ORDER — POTASSIUM CHLORIDE CRYS ER 20 MEQ PO TBCR
60.0000 meq | EXTENDED_RELEASE_TABLET | Freq: Two times a day (BID) | ORAL | Status: AC
  Administered 2022-09-26 (×2): 60 meq via ORAL
  Filled 2022-09-26 (×2): qty 3

## 2022-09-26 MED ORDER — POTASSIUM CHLORIDE CRYS ER 20 MEQ PO TBCR: 60.0000 meq | EXTENDED_RELEASE_TABLET | Freq: Once | ORAL | Status: DC

## 2022-09-26 MED ORDER — FUROSEMIDE 10 MG/ML IJ SOLN
80.0000 mg | Freq: Two times a day (BID) | INTRAMUSCULAR | Status: DC
  Administered 2022-09-26 – 2022-10-01 (×11): 80 mg via INTRAVENOUS
  Filled 2022-09-26 (×11): qty 8

## 2022-09-26 NOTE — Progress Notes (Signed)
Inpatient Rehab Admissions Coordinator:   Continue to await medical readiness for potential CIR admit. I did confirm with his cousin Sonia Side, that he can stay with him for most the day, though he does have to go out for work for 3-4 hrs at a time.   Clemens Catholic, [place], [place] Admissions Coordinator  [phone] ([place]) [phone] (office)

## 2022-09-26 NOTE — Progress Notes (Signed)
Pt has home CPAP machine in room that he will self administer QHS.

## 2022-09-26 NOTE — Progress Notes (Signed)
Patient ID: Ryan Garner, male   DOB: [birth year], 77 y.o.   MRN: [id number]     Advanced Heart Failure Rounding Note  PCP-Cardiologist: Dorris Carnes, MD   Subjective:    2/26 acute abdominal pain>>CT A/P shows large left rectus sheath hematoma. Heparin d/ced. Lasix gtt stopped.  2/27 acute HF w/ acute worsening of dyspnea>>120 mg IV Lasix + BiPAP  3.3 Lasix drip increased to '20mg'$  per hour and given metolazone.   Yesterday diuresed with lasix drip at 20 mg per hour. Remains on dobutamine 2.5 for RV support. CO-OX 64%.   Feeling ok. Denies SOB.    Objective:   Weight Range: 112.5 kg Body mass index is 37.71 kg/m.   Vital Signs:   Temp:  [97.4 F (36.3 C)-97.9 F (36.6 C)] 97.4 F (36.3 C) (03/06 0750) Pulse Rate:  [80-100] 100 (03/06 0750) Resp:  [17-20] 20 (03/06 0750) BP: (100-112)/(57-70) 102/57 (03/06 0750) SpO2:  [92 %-100 %] 92 % (03/06 0750) Weight:  [112.5 kg] 112.5 kg (03/06 0031) Last BM Date : 09/24/22  Weight change: Filed Weights   09/25/22 0041 09/25/22 0920 09/26/22 0031  Weight: 116.4 kg 116.4 kg 112.5 kg    Intake/Output:   Intake/Output Summary (Last 24 hours) at 09/26/2022 0950 Last data filed at 09/26/2022 0751 Gross per 24 hour  Intake 1362.09 ml  Output 5250 ml  Net -3887.91 ml  CVP 10-11 Physical Exam  General:  in the chair.  No resp difficulty HEENT: normal Neck: supple. JVP 10-11. Carotids 2+ bilat; no bruits. No lymphadenopathy or thryomegaly appreciated. Cor: PMI nondisplaced. Irregular rate & rhythm. No rubs, gallops or murmurs. Lungs: clear Abdomen: soft, nontender, nondistended. No hepatosplenomegaly. No bruits or masses. Good bowel sounds. Extremities: no cyanosis, clubbing, rash, R and LLE 1+ edema. RUE PIOC Neuro: alert & orientedx3, cranial nerves grossly intact. moves all 4 extremities w/o difficulty. Affect pleasant   Telemetry    A Fib   Labs  CBC Recent Labs    09/24/22 0450  WBC 6.8  HGB 7.8*  HCT 24.1*  MCV 90.6   PLT [id number]   Basic Metabolic Panel Recent Labs    09/25/22 0440 09/25/22 0922 09/26/22 0500  NA  --  133* 133*  K  --  3.2* 3.5  CL  --  88* 90*  CO2  --  31 27  GLUCOSE  --  163* 111*  BUN  --  38* 41*  CREATININE  --  1.50* 1.45*  CALCIUM  --  9.3 9.5  MG 1.5*  --  2.0   BNP: BNP (last 3 results) Recent Labs    11/20/21 1409 04/06/22 1253 09/12/22 2101  BNP 485.0* 682.5* 577.3*   ProBNP (last 3 results) Recent Labs    08/15/22 1049 09/04/22 1404 09/11/22 1530  PROBNP 5,315* 11,525* 8,444*   Imaging   No results found.  Medications:    Scheduled Medications:  apixaban  5 mg Oral BID   atorvastatin  80 mg Oral Daily   Chlorhexidine Gluconate Cloth  6 each Topical Daily   midodrine  2.5 mg Oral TID WC   senna-docusate  1 tablet Oral Daily   sodium chloride flush  3 mL Intravenous Q12H   umeclidinium bromide  1 puff Inhalation Daily   Infusions:  sodium chloride     DOBUTamine 2.5 mcg/kg/min (09/25/22 1027)   furosemide (LASIX) 200 mg in dextrose 5 % 100 mL (2 mg/mL) infusion 20 mg/hr (09/25/22 2322)   PRN Medications: sodium  chloride, acetaminophen, albuterol, guaiFENesin-dextromethorphan, magnesium hydroxide, morphine injection, ondansetron (ZOFRAN) IV, oxyCODONE, sodium chloride flush  Patient Profile   Mr Devone is 77 year old with a history of CAD, STEMI 2021, DES to LCx, permanent A fib, severe MR, HTN, HLD, sleep apnea, MR, chronic lymphedema, and HFpEF.    Admitted with A/C HFpEF.   Assessment/Plan  1. Acute on chronic HFpEF: With prominent RV dysfunction.  Echo this admission shows EF 50-55%, D-shaped septum, moderate-severe RV enlargement with moderate RV dysfunction, PASP 69, severe biatrial enlargement, moderate AS with mean gradient 21 mmHg/AVA 1.2 cm^2, IVC dilated. Diuretics held 2/26 w/ ABLA from rectus sheath hematoma. Had worsening HF/dyspnea>>120 mg IV Lasix + BiPAP. Lasix infusion started 2/28. He remains on dobutamine 2.5 for RV  support.   - CVP trending down. Aim to get CVP down ~ 8 .  - Stop lasix drip. Start lasix 80 mg IV twice a day. Once diuresed will wean DBA.  - BP too low for GDMT. Continue midodrine for BP support   2. Mitral regurgitation: Severe MR by echo.  Mechanism uncertain, may be functional MR with severe LAE. Suspect this is etiology of pulmonary hypertension and RV failure.  - mTEER evaluation on hold given complicated hospital course  3. AKI on CKD 3: Suspect cardiorenal in etiology. Creatinine stable 1.45 today - Continue dobutamine as above.   4. Atrial fibrillation: Permanent, has been seen by EP in the past. Not likely to successfully cardiovert and stay in NSR.  - Rate controlled.  - Eliquis restarted on 3/2 (held for rectus sheath hematom from heparin)  5. CAD:   Had DES to mLCx in 10/21.   - Consider coronary angiography ?pre-Mitraclip once rectus sheath hematoma resolves.  -No chest pain.  - Continue atorvastatin + eliquis.   6. LE wounds: Followed as outpatient by Dr. Sharol Given.  ABIs normal this admission.  - Wound care - Improved  7. Left Rectus Sheath Hematoma: 15.0 x 7.4 x 14.7 cm hematoma on CT 2/26.  -Hgb stable now at 7.8., transfusion if drops below 7.5   8. Anemia, acute blood loss - in setting of rectus sheath hematoma.  - Check CBC - rec'd IV iron  9. Hypokalemia - 3.5 , pending today -Supp K      Kylyn Sookram NP-C  09/26/2022 9:50 AM

## 2022-09-26 NOTE — Plan of Care (Signed)
  Problem: Activity: Goal: Risk for activity intolerance will decrease Outcome: Progressing   Problem: Coping: Goal: Level of anxiety will decrease Outcome: Progressing   Problem: Safety: Goal: Ability to remain free from injury will improve Outcome: Progressing   

## 2022-09-26 NOTE — Plan of Care (Signed)

## 2022-09-26 NOTE — Progress Notes (Signed)
Occupational Therapy Treatment Patient Details Name: Ryan Garner MRN: [id number] DOB: 01-Jul-1946 Today's Date: 09/26/2022   History of present illness Pt is a 77 y.o. M who presents 09/12/2022 with A/C HFpEF. 2/26 acute abdominal pain>>CT A/P shows large left rectus sheath hematoma.Significant PMH: CAD, STEMI 2021, DES to LCx, permanent A fib, severe MR, HTN, HLD, sleep apnea, DM, rotator cuff repair, MR, chronic lymphedema, and HFpEF.   OT comments  Pt is making good progress towards their acute OT goals. Pt received after PT session with report of fatigue. Overall he was min G for transfers and tolerated standing with the sink for ~4 minutes during grooming task. He requires at least 1 UE supported externally on ledge for balance and close min G for safety. He benefits from cues for initiation, problem solving and safety awareness throughout.  OT to continue to follow acutely. Discharge recommendation remains approrpaite.    Recommendations for follow up therapy are one component of a multi-disciplinary discharge planning process, led by the attending physician.  Recommendations may be updated based on patient status, additional functional criteria and insurance authorization.    Follow Up Recommendations  Acute inpatient rehab (3hours/day)     Assistance Recommended at Discharge Frequent or constant Supervision/Assistance  Patient can return home with the following  Two people to help with walking and/or transfers;A lot of help with bathing/dressing/bathroom;Assistance with cooking/housework;Help with stairs or ramp for entrance;Assist for transportation   Equipment Recommendations  Other (comment)       Precautions / Restrictions Precautions Precautions: Fall Restrictions Weight Bearing Restrictions: No       Mobility Bed Mobility               General bed mobility comments: in chair upon arrival    Transfers Overall transfer level: Needs assistance Equipment used:  Rolling walker (2 wheels) Transfers: Sit to/from Stand Sit to Stand: Min guard                 Balance Overall balance assessment: Needs assistance Sitting-balance support: Feet supported Sitting balance-Leahy Scale: Fair     Standing balance support: Single extremity supported, During functional activity Standing balance-Leahy Scale: Fair Standing balance comment: able to stand at the sink for grooming with 1 UE supported                           ADL either performed or assessed with clinical judgement   ADL Overall ADL's : Needs assistance/impaired     Grooming: Min guard;Standing Grooming Details (indicate cue type and reason): at the sink, close min G, unsteady balance.                 Toilet Transfer: Min guard;Ambulation;Rolling walker (2 wheels) Toilet Transfer Details (indicate cue type and reason): tolerates short distance, close min G         Functional mobility during ADLs: Min guard;Rolling walker (2 wheels);Cueing for safety General ADL Comments: Cues needed for initiation, problem solving and safety. Limited activity tolerance and requires at least 1 UE supported in standing    Extremity/Trunk Assessment Upper Extremity Assessment Upper Extremity Assessment: Generalized weakness   Lower Extremity Assessment Lower Extremity Assessment: Defer to PT evaluation        Vision   Vision Assessment?: No apparent visual deficits   Perception Perception Perception: Within Functional Limits   Praxis Praxis Praxis: Intact    Cognition Arousal/Alertness: Awake/alert Behavior During Therapy: Flat affect Overall Cognitive Status:  Within Functional Limits for tasks assessed                                 General Comments: continues to be flat, limited information given when discussing home set up and plan for going home              General Comments VSS on RA    Pertinent Vitals/ Pain       Pain Assessment Pain  Assessment: No/denies pain   Frequency  Min 2X/week        Progress Toward Goals  OT Goals(current goals can now be found in the care plan section)  Progress towards OT goals: Progressing toward goals  Acute Rehab OT Goals Patient Stated Goal: to get better OT Goal Formulation: With patient Time For Goal Achievement: 09/29/22 Potential to Achieve Goals: Good ADL Goals Pt Will Perform Lower Body Bathing: with min guard assist;sit to/from stand Pt Will Perform Lower Body Dressing: with min guard assist;sit to/from stand Pt Will Transfer to Toilet: with supervision;ambulating Additional ADL Goal #1: Pt will indep recall at least 3 energy conservation techniques to apply at d/c  Plan Discharge plan needs to be updated       AM-PAC OT "6 Clicks" Daily Activity     Outcome Measure   Help from another person eating meals?: None Help from another person taking care of personal grooming?: A Little Help from another person toileting, which includes using toliet, bedpan, or urinal?: A Lot Help from another person bathing (including washing, rinsing, drying)?: A Lot Help from another person to put on and taking off regular upper body clothing?: A Little Help from another person to put on and taking off regular lower body clothing?: A Lot 6 Click Score: 16    End of Session Equipment Utilized During Treatment: Rolling walker (2 wheels);Gait belt  OT Visit Diagnosis: Unsteadiness on feet (R26.81);Other abnormalities of gait and mobility (R26.89);Muscle weakness (generalized) (M62.81)   Activity Tolerance Patient tolerated treatment well   Patient Left in chair;with call bell/phone within reach   Nurse Communication Mobility status        Time: [id number] OT Time Calculation (min): 18 min  Charges: OT General Charges $OT Visit: 1 Visit OT Treatments $Self Care/Home Management : 8-22 mins  Shade Flood, OTR/L [place] Office [place] Communication Preferred   Elliot Cousin 09/26/2022, 3:07 PM

## 2022-09-26 NOTE — Progress Notes (Signed)
Physical Therapy Treatment Patient Details Name: Ryan Garner MRN: [id number] DOB: 1945-11-04 Today's Date: 09/26/2022   History of Present Illness Pt is a 77 y.o. M who presents 09/12/2022 with A/C HFpEF. 2/26 acute abdominal pain>>CT A/P shows large left rectus sheath hematoma.Significant PMH: CAD, STEMI 2021, DES to LCx, permanent A fib, severe MR, HTN, HLD, sleep apnea, DM, rotator cuff repair, MR, chronic lymphedema, and HFpEF.    PT Comments    Pt tolerated treatment well today. Pt was able to increase ambulation distance in hallway however once fatigued required more assistance to stand. Pt is continuing to make great progress and current DC plan remains appropriate. Pt would benefit from continued functional mobility training during acute stay.  Recommendations for follow up therapy are one component of a multi-disciplinary discharge planning process, led by the attending physician.  Recommendations may be updated based on patient status, additional functional criteria and insurance authorization.  Follow Up Recommendations  Acute inpatient rehab (3hours/day) Can patient physically be transported by private vehicle: No   Assistance Recommended at Discharge Frequent or constant Supervision/Assistance  Patient can return home with the following A lot of help with walking and/or transfers;Assistance with cooking/housework;Help with stairs or ramp for entrance;Assist for transportation   Equipment Recommendations  Other (comment) (Per accepting facility)    Recommendations for Other Services       Precautions / Restrictions Precautions Precautions: Fall Restrictions Weight Bearing Restrictions: No     Mobility  Bed Mobility               General bed mobility comments: in chair upon arrival    Transfers Overall transfer level: Needs assistance Equipment used: Rollator (4 wheels) Transfers: Sit to/from Stand Sit to Stand: Supervision, Min assist (Supervision on  initial STS however required Min A once fatigued)                Ambulation/Gait Ambulation/Gait assistance: Min guard Gait Distance (Feet): 150 Feet (Prolonged seated rest break in rollator) Assistive device: Rollator (4 wheels) Gait Pattern/deviations: Step-through pattern, Decreased stride length Gait velocity: decreased     General Gait Details: No LOB noted   Stairs             Wheelchair Mobility    Modified Rankin (Stroke Patients Only)       Balance Overall balance assessment: Mild deficits observed, not formally tested                                          Cognition Arousal/Alertness: Awake/alert Behavior During Therapy: Flat affect Overall Cognitive Status: Within Functional Limits for tasks assessed                                 General Comments: Pt continues to be minimally conversive        Exercises      General Comments General comments (skin integrity, edema, etc.): VSS on RA      Pertinent Vitals/Pain Pain Assessment Pain Assessment: No/denies pain    Home Living                          Prior Function            PT Goals (current goals can now be found in the care plan section)  Progress towards PT goals: Progressing toward goals    Frequency    Min 3X/week      PT Plan Current plan remains appropriate    Co-evaluation              AM-PAC PT "6 Clicks" Mobility   Outcome Measure  Help needed turning from your back to your side while in a flat bed without using bedrails?: A Lot Help needed moving from lying on your back to sitting on the side of a flat bed without using bedrails?: A Lot Help needed moving to and from a bed to a chair (including a wheelchair)?: A Little Help needed standing up from a chair using your arms (e.g., wheelchair or bedside chair)?: A Little Help needed to walk in hospital room?: A Little Help needed climbing 3-5 steps with a railing?  : Total 6 Click Score: 14    End of Session Equipment Utilized During Treatment: Gait belt Activity Tolerance: Patient tolerated treatment well Patient left: in chair;with call bell/phone within reach;with chair alarm set Nurse Communication: Mobility status PT Visit Diagnosis: Muscle weakness (generalized) (M62.81);Difficulty in walking, not elsewhere classified (R26.2)     Time: [id number] PT Time Calculation (min) (ACUTE ONLY): 28 min  Charges:  $Gait Training: 23-37 mins                     Shelby Mattocks, PT, DPT Acute Rehab Services [id number]    Viann Shove 09/26/2022, 4:29 PM

## 2022-09-26 NOTE — Plan of Care (Signed)

## 2022-09-26 NOTE — Progress Notes (Signed)
No respiratory distress noted. No BIPAP needed at this time.

## 2022-09-27 DIAGNOSIS — I5033 Acute on chronic diastolic (congestive) heart failure: Secondary | ICD-10-CM | POA: Diagnosis not present

## 2022-09-27 LAB — BASIC METABOLIC PANEL
Anion gap: 15 (ref 5–15)
BUN: 39 mg/dL — ABNORMAL HIGH (ref 8–23)
CO2: 29 mmol/L (ref 22–32)
Calcium: 9.5 mg/dL (ref 8.9–10.3)
Chloride: 88 mmol/L — ABNORMAL LOW (ref 98–111)
Creatinine, Ser: 1.59 mg/dL — ABNORMAL HIGH (ref 0.61–1.24)
GFR, Estimated: 44 mL/min — ABNORMAL LOW (ref >60)
Glucose, Bld: 100 mg/dL — ABNORMAL HIGH (ref 70–99)
Potassium: 3.9 mmol/L (ref 3.5–5.1)
Sodium: 132 mmol/L — ABNORMAL LOW (ref 135–145)

## 2022-09-27 LAB — MAGNESIUM: Magnesium: 1.9 mg/dL (ref 1.7–2.4)

## 2022-09-27 LAB — COOXEMETRY PANEL
Carboxyhemoglobin: 2.9 % — ABNORMAL HIGH (ref 0.5–1.5)
Methemoglobin: <0.7 % (ref 0.0–1.5)
O2 Saturation: 60.2 %
Total hemoglobin: 8.5 g/dL — ABNORMAL LOW (ref 12.0–16.0)

## 2022-09-27 MED ORDER — METOLAZONE 5 MG PO TABS
5.0000 mg | ORAL_TABLET | Freq: Once | ORAL | Status: AC
  Administered 2022-09-27: 5 mg via ORAL
  Filled 2022-09-27: qty 1

## 2022-09-27 MED ORDER — POTASSIUM CHLORIDE CRYS ER 20 MEQ PO TBCR
40.0000 meq | EXTENDED_RELEASE_TABLET | Freq: Once | ORAL | Status: AC
  Administered 2022-09-27: 40 meq via ORAL
  Filled 2022-09-27: qty 2

## 2022-09-27 NOTE — Progress Notes (Signed)
Mobility Specialist - Progress Note   09/27/22 0900  Mobility  Activity Ambulated with assistance in hallway  Level of Assistance Contact guard assist, steadying assist  Assistive Device Four wheel walker  Distance Ambulated (ft) 150 ft  Activity Response Tolerated well  Mobility Referral Yes  $Mobility charge 1 Mobility    Pt received in recliner and agreeable. Took seated break x1 in hallway. C/o ankle pain, RN notified. Left in recliner w/ RN present and all needs met.   [place] Specialist Please contact via SecureChat or Rehab office at [phone]

## 2022-09-27 NOTE — Progress Notes (Signed)
Patient ID: Ryan Garner, male   DOB: 12/18/1945, 77 y.o.   MRN: [id number]     Advanced Heart Failure Rounding Note  PCP-Cardiologist: Dorris Carnes, MD   Subjective:   2/26 acute abdominal pain>>CT A/P shows large left rectus sheath hematoma. Heparin d/ced. Lasix gtt stopped.  2/27 acute HF w/ acute worsening of dyspnea>>120 mg IV Lasix + BiPAP  3.3 Lasix drip increased to '20mg'$  per hour and given metolazone.   Lasix drip turned off 3/6, now on lasix '80mg'$  IV BID. Remains on dobutamine 2.5 for RV support. CO-OX 60%. CVP up to 13 today. SCr 1.45>1.59  Feels ok. Minimally conversational as he has just returned from a walk. Complaining of L ankle pain 2/2 UNNA boots. Denies CP.    Objective:   Weight Range: 111.7 kg Body mass index is 37.44 kg/m.   Vital Signs:   Temp:  [97.3 F (36.3 C)-98.3 F (36.8 C)] 97.6 F (36.4 C) (03/07 0738) Pulse Rate:  [64-100] 92 (03/07 0738) Resp:  [20] 20 (03/07 0738) BP: (97-122)/(50-108) 100/63 (03/07 0738) SpO2:  [92 %-98 %] 94 % (03/07 0816) Weight:  [111.7 kg] 111.7 kg (03/07 0025) Last BM Date : 09/26/22  Weight change: Filed Weights   09/25/22 0920 09/26/22 0031 09/27/22 0025  Weight: 116.4 kg 112.5 kg 111.7 kg    Intake/Output:   Intake/Output Summary (Last 24 hours) at 09/27/2022 0850 Last data filed at 09/27/2022 0814 Gross per 24 hour  Intake 1299.11 ml  Output 2000 ml  Net -700.89 ml  CVP 13 Physical Exam  General:  well appearing, sitting in chair.  No respiratory difficulty HEENT: normal Neck: supple. JVD ~12 cm. Carotids 2+ bilat; no bruits. No lymphadenopathy or thyromegaly appreciated. Cor: PMI nondisplaced. Irregular rate & rhythm. No rubs, gallops or murmurs. Lungs: coarse on expiration Abdomen: soft, nontender, nondistended. No hepatosplenomegaly. No bruits or masses. Good bowel sounds. Extremities: no cyanosis, clubbing, rash, +2 BLE edema to thigh Neuro: alert & oriented x 3, cranial nerves grossly intact. moves all 4  extremities w/o difficulty. Affect pleasant.  Telemetry   A Fib low 100s 0-1 PVCs/hr (Personally reviewed)  Labs  CBC No results for input(s): "WBC", "NEUTROABS", "HGB", "HCT", "MCV", "PLT" in the last 72 hours.  Basic Metabolic Panel Recent Labs    09/26/22 0500 09/27/22 0500  NA 133* 132*  K 3.5 3.9  CL 90* 88*  CO2 27 29  GLUCOSE 111* 100*  BUN 41* 39*  CREATININE 1.45* 1.59*  CALCIUM 9.5 9.5  MG 2.0 1.9   BNP: BNP (last 3 results) Recent Labs    11/20/21 1409 04/06/22 1253 09/12/22 2101  BNP 485.0* 682.5* 577.3*   ProBNP (last 3 results) Recent Labs    08/15/22 1049 09/04/22 1404 09/11/22 1530  PROBNP 5,315* 11,525* 8,444*   Imaging   No results found.  Medications:    Scheduled Medications:  apixaban  5 mg Oral BID   atorvastatin  80 mg Oral Daily   Chlorhexidine Gluconate Cloth  6 each Topical Daily   furosemide  80 mg Intravenous BID   midodrine  2.5 mg Oral TID WC   senna-docusate  1 tablet Oral Daily   sodium chloride flush  3 mL Intravenous Q12H   umeclidinium bromide  1 puff Inhalation Daily   Infusions:  sodium chloride     DOBUTamine 2.5 mcg/kg/min (09/26/22 1800)   PRN Medications: sodium chloride, acetaminophen, albuterol, guaiFENesin-dextromethorphan, magnesium hydroxide, morphine injection, ondansetron (ZOFRAN) IV, oxyCODONE, sodium chloride flush  Patient Profile   Ryan Garner is 77 year old with a history of CAD, STEMI 2021, DES to LCx, permanent A fib, severe Ryan, HTN, HLD, sleep apnea, Ryan, chronic lymphedema, and HFpEF.    Admitted with A/C HFpEF.   Assessment/Plan  1. Acute on chronic HFpEF: With prominent RV dysfunction.  Echo this admission shows EF 50-55%, D-shaped septum, moderate-severe RV enlargement with moderate RV dysfunction, PASP 69, severe biatrial enlargement, moderate AS with mean gradient 21 mmHg/AVA 1.2 cm^2, IVC dilated. Diuretics held 2/26 w/ ABLA from rectus sheath hematoma. Had worsening HF/dyspnea>>120 mg IV  Lasix + BiPAP. Lasix infusion started 2/28. He remains on dobutamine 2.5 for RV support.   - CVP 13. Aim to get CVP down ~ 8. Has gone up since stopping lasix gtt. SCr getting worst.   - Continue lasix 80 mg IV twice a day. Once diuresed will wean DBA, continue for now.  - BP too low for GDMT. Continue midodrine for BP support   2. Mitral regurgitation: Severe Ryan by echo.  Mechanism uncertain, may be functional Ryan with severe LAE. Suspect this is etiology of pulmonary hypertension and RV failure.  - mTEER evaluation on hold given complicated hospital course  3. AKI on CKD 3: Suspect cardiorenal in etiology. Creatinine 1.45>1.59 today - Continue dobutamine as above.   4. Atrial fibrillation: Permanent, has been seen by EP in the past. Not likely to successfully cardiovert and stay in NSR.  - Rate controlled.  - Eliquis restarted on 3/2 (held for rectus sheath hematom from heparin)  5. CAD:   Had DES to mLCx in 10/21.   - Consider coronary angiography ?pre-Mitraclip once rectus sheath hematoma resolves.  -No chest pain.  - Continue atorvastatin + eliquis.   6. LE wounds: Followed as outpatient by Dr. Sharol Given.  ABIs normal this admission.  - Wound care - Improved  7. Left Rectus Sheath Hematoma: 15.0 x 7.4 x 14.7 cm hematoma on CT 2/26.  -Hgb stable now at 7.8., transfusion if drops below 7.5   8. Anemia, acute blood loss - in setting of rectus sheath hematoma.  - Check CBC - rec'd IV iron  9. Hypokalemia - 3.9 -Supp K as needed, stable  Earnie Larsson AGACNP-BC 09/27/2022 8:50 AM

## 2022-09-27 NOTE — Plan of Care (Signed)
  Problem: Clinical Measurements: Goal: Respiratory complications will improve Outcome: Progressing   Problem: Activity: Goal: Risk for activity intolerance will decrease Outcome: Progressing   Problem: Elimination: Goal: Will not experience complications related to urinary retention Outcome: Progressing   Problem: Safety: Goal: Ability to remain free from injury will improve Outcome: Progressing   

## 2022-09-28 DIAGNOSIS — I5033 Acute on chronic diastolic (congestive) heart failure: Secondary | ICD-10-CM | POA: Diagnosis not present

## 2022-09-28 LAB — BASIC METABOLIC PANEL
Anion gap: 11 (ref 5–15)
BUN: 39 mg/dL — ABNORMAL HIGH (ref 8–23)
CO2: 33 mmol/L — ABNORMAL HIGH (ref 22–32)
Calcium: 9.6 mg/dL (ref 8.9–10.3)
Chloride: 88 mmol/L — ABNORMAL LOW (ref 98–111)
Creatinine, Ser: 1.57 mg/dL — ABNORMAL HIGH (ref 0.61–1.24)
GFR, Estimated: 45 mL/min — ABNORMAL LOW (ref >60)
Glucose, Bld: 103 mg/dL — ABNORMAL HIGH (ref 70–99)
Potassium: 3.3 mmol/L — ABNORMAL LOW (ref 3.5–5.1)
Sodium: 132 mmol/L — ABNORMAL LOW (ref 135–145)

## 2022-09-28 LAB — COOXEMETRY PANEL
Carboxyhemoglobin: 2.8 % — ABNORMAL HIGH (ref 0.5–1.5)
Methemoglobin: <0.7 % (ref 0.0–1.5)
O2 Saturation: 64.8 %
Total hemoglobin: 8.8 g/dL — ABNORMAL LOW (ref 12.0–16.0)

## 2022-09-28 LAB — CBC
HCT: 26.3 % — ABNORMAL LOW (ref 39.0–52.0)
Hemoglobin: 8.4 g/dL — ABNORMAL LOW (ref 13.0–17.0)
MCH: 29.1 pg (ref 26.0–34.0)
MCHC: 31.9 g/dL (ref 30.0–36.0)
MCV: 91 fL (ref 80.0–100.0)
Platelets: 289 10*3/uL (ref 150–400)
RBC: 2.89 MIL/uL — ABNORMAL LOW (ref 4.22–5.81)
RDW: 17.6 % — ABNORMAL HIGH (ref 11.5–15.5)
WBC: 6 10*3/uL (ref 4.0–10.5)
nRBC: 0 % (ref 0.0–0.2)

## 2022-09-28 LAB — MAGNESIUM: Magnesium: 1.8 mg/dL (ref 1.7–2.4)

## 2022-09-28 MED ORDER — METOLAZONE 5 MG PO TABS
5.0000 mg | ORAL_TABLET | Freq: Once | ORAL | Status: AC
  Administered 2022-09-28: 5 mg via ORAL
  Filled 2022-09-28: qty 1

## 2022-09-28 MED ORDER — ACETAZOLAMIDE 250 MG PO TABS
250.0000 mg | ORAL_TABLET | Freq: Once | ORAL | Status: AC
  Administered 2022-09-28: 250 mg via ORAL
  Filled 2022-09-28: qty 1

## 2022-09-28 MED ORDER — POTASSIUM CHLORIDE CRYS ER 20 MEQ PO TBCR
60.0000 meq | EXTENDED_RELEASE_TABLET | Freq: Once | ORAL | Status: AC
  Administered 2022-09-28: 60 meq via ORAL
  Filled 2022-09-28: qty 3

## 2022-09-28 NOTE — Progress Notes (Signed)
Occupational Therapy Treatment Patient Details Name: Ryan Garner MRN: [id number] DOB: 1946/06/06 Today's Date: 09/28/2022   History of present illness Pt is a 77 y.o. M who presents 09/12/2022 with A/C HFpEF. 2/26 acute abdominal pain>>CT A/P shows large left rectus sheath hematoma.Significant PMH: CAD, STEMI 2021, DES to LCx, permanent A fib, severe MR, HTN, HLD, sleep apnea, DM, rotator cuff repair, MR, chronic lymphedema, and HFpEF.   OT comments  Pt continuing to progress towards patient focused goals. Pt completing room level ambulation with Min guard progressing to SBA with use of RW. No complaints of SOB or dizziness, no rest breaks needed. Pt progressed in sit>stands to Supervision from [facility]. Pt tolerated standing at sink ~3 mins to perform hygiene routine. Pt would benefit from continued reinforcement of RW management. Pt needed increased time for toileting in today's session. Pt to continue with skilled acute OT services to address the above deficits and improve functional independence. OT continues to recommend inpatient rehab for post-acute services.    Recommendations for follow up therapy are one component of a multi-disciplinary discharge planning process, led by the attending physician.  Recommendations may be updated based on patient status, additional functional criteria and insurance authorization.    Follow Up Recommendations  Acute inpatient rehab (3hours/day)     Assistance Recommended at Discharge Frequent or constant Supervision/Assistance  Patient can return home with the following  A little help with walking and/or transfers;Assistance with cooking/housework;Help with stairs or ramp for entrance;Assist for transportation;A lot of help with bathing/dressing/bathroom   Equipment Recommendations   (TBD at next level of care)    Recommendations for Other Services      Precautions / Restrictions Precautions Precautions: Fall Restrictions Weight Bearing Restrictions:  No       Mobility Bed Mobility                    Transfers Overall transfer level: Needs assistance Equipment used: Rolling walker (2 wheels) Transfers: Sit to/from Stand Sit to Stand: Supervision, Min assist           General transfer comment: Pt need Min A to perform initial sit>stand from chair, completed sit>stand x2 from [facility] with Supervision     Balance Overall balance assessment: Mild deficits observed, not formally tested Sitting-balance support: Feet supported Sitting balance-Leahy Scale: Fair     Standing balance support: Single extremity supported, During functional activity Standing balance-Leahy Scale: Poor Standing balance comment: Able to stand sink side to brush teeth and apply lip balm, 1 UE supported on sink.                           ADL either performed or assessed with clinical judgement   ADL Overall ADL's : Needs assistance/impaired     Grooming: Supervision/safety;Oral care;Standing Grooming Details (indicate cue type and reason): Stood at sink with one UE supported, SBA progress to close Supervision         Upper Body Dressing : Min guard;Sitting Upper Body Dressing Details (indicate cue type and reason): Had trouble donning gown like a jacket, likely due to several lines/leads     Toilet Transfer: Ambulation;Rolling walker (2 wheels);BSC/3in1;Min guard   Toileting- Clothing Manipulation and Hygiene: Minimal assistance Toileting - Clothing Manipulation Details (indicate cue type and reason): SBA sit>stand. Needed assist to lift back of gown before toileting     Functional mobility during ADLs: Min guard;Rolling walker (2 wheels) General ADL Comments: Did not need  any rest breaks during room level ambulation and functional activity standing at sink    Extremity/Trunk Assessment Upper Extremity Assessment Upper Extremity Assessment: Generalized weakness   Lower Extremity Assessment Lower Extremity Assessment: Defer  to PT evaluation   Cervical / Trunk Assessment Cervical / Trunk Assessment: Kyphotic    Vision Baseline Vision/History: 0 No visual deficits Vision Assessment?: No apparent visual deficits   Perception Perception Perception: Not tested   Praxis Praxis Praxis: Intact    Cognition Arousal/Alertness: Awake/alert Behavior During Therapy: Flat affect Overall Cognitive Status: Within Functional Limits for tasks assessed                                 General Comments: Pt speaks very softly, but enegaged in conversation during session when prompted        Exercises      Shoulder Instructions       General Comments VSS on RA    Pertinent Vitals/ Pain       Pain Assessment Pain Assessment: No/denies pain  Home Living                                          Prior Functioning/Environment              Frequency  Min 2X/week        Progress Toward Goals  OT Goals(current goals can now be found in the care plan section)  Progress towards OT goals: Progressing toward goals  Acute Rehab OT Goals Patient Stated Goal: to get better OT Goal Formulation: With patient Time For Goal Achievement: 09/29/22 Potential to Achieve Goals: Good  Plan Frequency remains appropriate    Co-evaluation          OT goals addressed during session: ADL's and self-care;Proper use of Adaptive equipment and DME      AM-PAC OT "6 Clicks" Daily Activity     Outcome Measure   Help from another person eating meals?: None Help from another person taking care of personal grooming?: A Little Help from another person toileting, which includes using toliet, bedpan, or urinal?: A Lot Help from another person bathing (including washing, rinsing, drying)?: A Lot Help from another person to put on and taking off regular upper body clothing?: A Little Help from another person to put on and taking off regular lower body clothing?: A Lot 6 Click Score:  16    End of Session Equipment Utilized During Treatment: Rolling walker (2 wheels)  OT Visit Diagnosis: Unsteadiness on feet (R26.81);Other abnormalities of gait and mobility (R26.89);Muscle weakness (generalized) (M62.81)   Activity Tolerance Patient tolerated treatment well   Patient Left in chair;with call bell/phone within reach   Nurse Communication Mobility status        Time: [id number] OT Time Calculation (min): 22 min  Charges: OT General Charges $OT Visit: 1 Visit OT Treatments $Self Care/Home Management : 8-22 mins  09/28/2022  AB, OTR/L  Acute Rehabilitation Services  Office: [phone]   Cori Razor 09/28/2022, 3:20 PM

## 2022-09-28 NOTE — Progress Notes (Signed)
Patient ID: Ryan Garner, male   DOB: 19-Feb-1946, 78 y.o.   MRN: [id number]     Advanced Heart Failure Rounding Note  PCP-Cardiologist: Dorris Carnes, MD   Subjective:   2/26 acute abdominal pain>>CT A/P shows large left rectus sheath hematoma. Heparin d/ced. Lasix gtt stopped.  2/27 acute HF w/ acute worsening of dyspnea>>120 mg IV Lasix + BiPAP  3.3 Lasix drip increased to '20mg'$  per hour and given metolazone.   Lasix drip turned off 3/6, now on lasix '80mg'$  IV BID. Remains on dobutamine 2.5 for RV support. CO-OX 65%. CVP 11. SCr 1.45>1.59>1.57  Feels fine this morning. Denies CP or SOB. UNNA boots reapplied yesterday, no complaints.    Objective:   Weight Range: 111.4 kg Body mass index is 37.34 kg/m.   Vital Signs:   Temp:  [97.3 F (36.3 C)-98.7 F (37.1 C)] 97.5 F (36.4 C) (03/08 0521) Pulse Rate:  [80-97] 88 (03/08 0731) Resp:  [20] 20 (03/08 0731) BP: (90-114)/(60-68) 111/68 (03/08 0731) SpO2:  [94 %-98 %] 97 % (03/08 0731) Weight:  [111.4 kg] 111.4 kg (03/08 0103) Last BM Date : 09/27/22  Weight change: Filed Weights   09/26/22 0031 09/27/22 0025 09/28/22 0103  Weight: 112.5 kg 111.7 kg 111.4 kg    Intake/Output:   Intake/Output Summary (Last 24 hours) at 09/28/2022 0759 Last data filed at 09/28/2022 0525 Gross per 24 hour  Intake 240 ml  Output 2100 ml  Net -1860 ml  CVP 11 Physical Exam  General:  well appearing, sitting in chair.  No respiratory difficulty HEENT: normal Neck: supple. JVD ~10 cm. Carotids 2+ bilat; no bruits. No lymphadenopathy or thyromegaly appreciated. Cor: PMI nondisplaced. Irregular rate & rhythm. No rubs, gallops or murmurs. Lungs: coarse on expiration Abdomen: obese, soft, nontender, nondistended. No hepatosplenomegaly. No bruits or masses. Good bowel sounds. Extremities: no cyanosis, clubbing, rash, +1 BLE edema to thigh. + UNNA boots. PICC RUE Neuro: alert & oriented x 3, cranial nerves grossly intact. moves all 4 extremities w/o  difficulty. Affect pleasant.  Telemetry   A Fib 80s (Personally reviewed)  Labs  CBC Recent Labs    09/28/22 0500  WBC 6.0  HGB 8.4*  HCT 26.3*  MCV 91.0  PLT [id number]   Basic Metabolic Panel Recent Labs    09/27/22 0500 09/28/22 0500  NA 132* 132*  K 3.9 3.3*  CL 88* 88*  CO2 29 33*  GLUCOSE 100* 103*  BUN 39* 39*  CREATININE 1.59* 1.57*  CALCIUM 9.5 9.6  MG 1.9 1.8   BNP: BNP (last 3 results) Recent Labs    11/20/21 1409 04/06/22 1253 09/12/22 2101  BNP 485.0* 682.5* 577.3*   ProBNP (last 3 results) Recent Labs    08/15/22 1049 09/04/22 1404 09/11/22 1530  PROBNP 5,315* 11,525* 8,444*   Imaging   No results found.  Medications:    Scheduled Medications:  apixaban  5 mg Oral BID   atorvastatin  80 mg Oral Daily   Chlorhexidine Gluconate Cloth  6 each Topical Daily   furosemide  80 mg Intravenous BID   midodrine  2.5 mg Oral TID WC   senna-docusate  1 tablet Oral Daily   sodium chloride flush  3 mL Intravenous Q12H   umeclidinium bromide  1 puff Inhalation Daily   Infusions:  sodium chloride     DOBUTamine 2.5 mcg/kg/min (09/27/22 1044)   PRN Medications: sodium chloride, acetaminophen, albuterol, guaiFENesin-dextromethorphan, magnesium hydroxide, morphine injection, ondansetron (ZOFRAN) IV, oxyCODONE, sodium chloride flush  Patient Profile  Mr Mammone is 77 year old with a history of CAD, STEMI 2021, DES to LCx, permanent A fib, severe MR, HTN, HLD, sleep apnea, MR, chronic lymphedema, and HFpEF.    Admitted with A/C HFpEF.  Assessment/Plan  1. Acute on chronic HFpEF: With prominent RV dysfunction.  Echo this admission shows EF 50-55%, D-shaped septum, moderate-severe RV enlargement with moderate RV dysfunction, PASP 69, severe biatrial enlargement, moderate AS with mean gradient 21 mmHg/AVA 1.2 cm^2, IVC dilated. Diuretics held 2/26 w/ ABLA from rectus sheath hematoma. Had worsening HF/dyspnea>>120 mg IV Lasix + BiPAP. Lasix infusion started 2/28.  He remains on dobutamine 2.5 for RV support.   - CVP 11. Aim to get CVP down ~ 8. SCr stable - Continue lasix 80 mg IV twice a day. Once diuresed will wean DBA, continue for now. Will add metolazone 5 mg and diamox 250 mg x1 today.  - BP too low for GDMT. Continue midodrine for BP support   2. Mitral regurgitation: Severe MR by echo.  Mechanism uncertain, may be functional MR with severe LAE. Suspect this is etiology of pulmonary hypertension and RV failure.  - mTEER evaluation on hold given complicated hospital course  3. AKI on CKD 3: Suspect cardiorenal in etiology. Creatinine 1.45>1.59>1.57 today - Continue dobutamine as above.   4. Atrial fibrillation: Permanent, has been seen by EP in the past. Not likely to successfully cardiovert and stay in NSR.  - Rate controlled.  - Eliquis restarted on 3/2 (held for rectus sheath hematom from heparin)  5. CAD:   Had DES to mLCx in 10/21.   - Consider coronary angiography ?pre-Mitraclip once rectus sheath hematoma resolves.  - No chest pain.  - Continue atorvastatin + eliquis.   6. LE wounds: Followed as outpatient by Dr. Sharol Given.  ABIs normal this admission.  - Wound care - Improved  7. Left Rectus Sheath Hematoma: 15.0 x 7.4 x 14.7 cm hematoma on CT 2/26.  -Hgb stable now at 8.4., transfusion if drops below 7.5   8. Anemia, acute blood loss - in setting of rectus sheath hematoma.  - Check CBC - rec'd IV iron  9. Hypokalemia - 3.3 -Supp K   Earnie Larsson AGACNP-BC 09/28/2022 7:59 AM

## 2022-09-28 NOTE — Plan of Care (Signed)
  Problem: Activity: Goal: Risk for activity intolerance will decrease Outcome: Progressing   Problem: Coping: Goal: Level of anxiety will decrease Outcome: Progressing   

## 2022-09-28 NOTE — Progress Notes (Signed)
Inpatient Rehab Admissions Coordinator:  Pt not medically ready for potential CIR admission. Will continue to follow.   Coletta Lockner Graves Madden, MS, CCC-SLP Admissions Coordinator [phone]  

## 2022-09-28 NOTE — Progress Notes (Signed)
Physical Therapy Treatment Patient Details Name: Ryan Garner MRN: [id number] DOB: 06-09-1946 Today's Date: 09/28/2022   History of Present Illness Pt is a 77 y.o. M who presents 09/12/2022 with A/C HFpEF. 2/26 acute abdominal pain>>CT A/P shows large left rectus sheath hematoma.Significant PMH: CAD, STEMI 2021, DES to LCx, permanent A fib, severe MR, HTN, HLD, sleep apnea, DM, rotator cuff repair, MR, chronic lymphedema, and HFpEF.    PT Comments    Pt tolerated treatment well today. Pt was able to ambulate in hallway however was limited by fatigue and L arm pain that pt states is chronic. Pt may benefit from a platform rollator for ambulation next session. No change in DC/DME recs.    Recommendations for follow up therapy are one component of a multi-disciplinary discharge planning process, led by the attending physician.  Recommendations may be updated based on patient status, additional functional criteria and insurance authorization.  Follow Up Recommendations  Acute inpatient rehab (3hours/day) Can patient physically be transported by private vehicle: No   Assistance Recommended at Discharge Frequent or constant Supervision/Assistance  Patient can return home with the following A lot of help with walking and/or transfers;Assistance with cooking/housework;Help with stairs or ramp for entrance;Assist for transportation   Equipment Recommendations  Other (comment) (Per accepting facility)    Recommendations for Other Services       Precautions / Restrictions Precautions Precautions: Fall Restrictions Weight Bearing Restrictions: No     Mobility  Bed Mobility                    Transfers Overall transfer level: Needs assistance Equipment used: Rollator (4 wheels) Transfers: Sit to/from Stand Sit to Stand: Supervision, Min guard           General transfer comment: Pt required Min guard assist once fatigued    Ambulation/Gait Ambulation/Gait assistance: Min  guard Gait Distance (Feet): 200 Feet (1 prolonged seated rest break on rollator) Assistive device: Rollator (4 wheels) Gait Pattern/deviations: Step-through pattern, Decreased stride length Gait velocity: decreased     General Gait Details: No LOB noted   Stairs             Wheelchair Mobility    Modified Rankin (Stroke Patients Only)       Balance Overall balance assessment: Mild deficits observed, not formally tested                                          Cognition Arousal/Alertness: Awake/alert Behavior During Therapy: Flat affect Overall Cognitive Status: Within Functional Limits for tasks assessed                                 General Comments: Pt continues to be minimally conversive        Exercises      General Comments General comments (skin integrity, edema, etc.): VSS on RA      Pertinent Vitals/Pain Pain Assessment Pain Assessment: Faces Faces Pain Scale: Hurts little more Pain Location: L arm Pain Descriptors / Indicators: Cramping, Constant, Grimacing Pain Intervention(s): Monitored during session, Relaxation    Home Living                          Prior Function  PT Goals (current goals can now be found in the care plan section) Progress towards PT goals: Progressing toward goals    Frequency    Min 3X/week      PT Plan Current plan remains appropriate    Co-evaluation              AM-PAC PT "6 Clicks" Mobility   Outcome Measure  Help needed turning from your back to your side while in a flat bed without using bedrails?: A Lot Help needed moving from lying on your back to sitting on the side of a flat bed without using bedrails?: A Lot Help needed moving to and from a bed to a chair (including a wheelchair)?: A Little Help needed standing up from a chair using your arms (e.g., wheelchair or bedside chair)?: A Little Help needed to walk in hospital room?: A  Little Help needed climbing 3-5 steps with a railing? : Total 6 Click Score: 14    End of Session Equipment Utilized During Treatment: Gait belt Activity Tolerance: Patient tolerated treatment well Patient left: in chair;with call bell/phone within reach;with chair alarm set Nurse Communication: Mobility status PT Visit Diagnosis: Muscle weakness (generalized) (M62.81);Difficulty in walking, not elsewhere classified (R26.2)     Time: [id number] PT Time Calculation (min) (ACUTE ONLY): 31 min  Charges:  $Gait Training: 23-37 mins                     Shelby Mattocks, PT, DPT Acute Rehab Services [id number]    Ryan Garner 09/28/2022, 2:22 PM

## 2022-09-29 DIAGNOSIS — I5033 Acute on chronic diastolic (congestive) heart failure: Secondary | ICD-10-CM | POA: Diagnosis not present

## 2022-09-29 LAB — COOXEMETRY PANEL
Carboxyhemoglobin: 2.8 % — ABNORMAL HIGH (ref 0.5–1.5)
Methemoglobin: <0.7 % (ref 0.0–1.5)
O2 Saturation: 68.9 %
Total hemoglobin: 8.7 g/dL — ABNORMAL LOW (ref 12.0–16.0)

## 2022-09-29 LAB — BASIC METABOLIC PANEL
Anion gap: 8 (ref 5–15)
BUN: 37 mg/dL — ABNORMAL HIGH (ref 8–23)
CO2: 33 mmol/L — ABNORMAL HIGH (ref 22–32)
Calcium: 9.5 mg/dL (ref 8.9–10.3)
Chloride: 91 mmol/L — ABNORMAL LOW (ref 98–111)
Creatinine, Ser: 1.5 mg/dL — ABNORMAL HIGH (ref 0.61–1.24)
GFR, Estimated: 47 mL/min — ABNORMAL LOW (ref >60)
Glucose, Bld: 93 mg/dL (ref 70–99)
Potassium: 3.4 mmol/L — ABNORMAL LOW (ref 3.5–5.1)
Sodium: 132 mmol/L — ABNORMAL LOW (ref 135–145)

## 2022-09-29 LAB — MAGNESIUM: Magnesium: 1.9 mg/dL (ref 1.7–2.4)

## 2022-09-29 MED ORDER — ACETAZOLAMIDE SODIUM 500 MG IJ SOLR
250.0000 mg | Freq: Once | INTRAMUSCULAR | Status: AC
  Administered 2022-09-29: 250 mg via INTRAVENOUS
  Filled 2022-09-29: qty 250

## 2022-09-29 MED ORDER — METOLAZONE 5 MG PO TABS
5.0000 mg | ORAL_TABLET | Freq: Once | ORAL | Status: AC
  Administered 2022-09-29: 5 mg via ORAL
  Filled 2022-09-29: qty 1

## 2022-09-29 MED ORDER — STERILE WATER FOR INJECTION IJ SOLN
INTRAMUSCULAR | Status: AC
  Filled 2022-09-29: qty 10

## 2022-09-29 MED ORDER — DOBUTAMINE IN D5W 4-5 MG/ML-% IV SOLN
1.2500 ug/kg/min | INTRAVENOUS | Status: DC
  Administered 2022-09-29: 1.25 ug/kg/min via INTRAVENOUS
  Filled 2022-09-29: qty 250

## 2022-09-29 NOTE — Progress Notes (Addendum)
Patient ID: Ryan Garner, male   DOB: [birth year], 77 y.o.   MRN: [id number]     Advanced Heart Failure Rounding Note  PCP-Cardiologist: Ryan Carnes, MD   Subjective:   2/26 acute abdominal pain>>CT A/P shows large left rectus sheath hematoma. Heparin d/ced. Lasix gtt stopped.  2/27 acute HF w/ acute worsening of dyspnea>>120 mg IV Lasix + BiPAP  3.3 Lasix drip increased to '20mg'$  per hour and given metolazone.   CVP 12 this AM on dobutamine 2.7mg/kg/min. No complaints. Wants to go home.    Objective:   Weight Range: 111.4 kg Body mass index is 37.34 kg/m.   Vital Signs:   Temp:  [97.6 F (36.4 C)-98.3 F (36.8 C)] 97.6 F (36.4 C) (03/09 1129) Pulse Rate:  [70-95] 88 (03/09 1129) Resp:  [18-21] 19 (03/09 1144) BP: (100-106)/(54-67) 100/67 (03/09 1144) SpO2:  [96 %-100 %] 96 % (03/09 0900) Weight:  [111.4 kg] 111.4 kg (03/09 0633) Last BM Date : 09/28/22  Weight change: Filed Weights   09/27/22 0025 09/28/22 0103 09/29/22 [id number] Weight: 111.7 kg 111.4 kg 111.4 kg    Intake/Output:   Intake/Output Summary (Last 24 hours) at 09/29/2022 1257 Last data filed at 09/29/2022 0937 Gross per 24 hour  Intake 1001.28 ml  Output 3450 ml  Net -2448.72 ml   CVP 11 Physical Exam  General:  well appearing, sitting in chair. comfortable; NAD HEENT: normal Neck: supple. JVD ~10 cm. Carotids 2+ bilat; no bruits. No lymphadenopathy or thyromegaly appreciated. Cor: PMI nondisplaced. Irregular rate & rhythm. No rubs, gallops or murmurs. Lungs: coarse on expiration Abdomen: obese, soft, nontender, nondistended. No hepatosplenomegaly. No bruits or masses. Good bowel sounds. Extremities: no cyanosis, clubbing, rash, +1 BLE edema to thigh. + UNNA boots. PICC RUE Neuro: alert & oriented x 3, cranial nerves grossly intact. moves all 4 extremities w/o difficulty. Affect pleasant.  Telemetry   A Fib 80s (Personally reviewed)  Labs  CBC Recent Labs    09/28/22 0500  WBC 6.0  HGB 8.4*  HCT  26.3*  MCV 91.0  PLT [id number]   Basic Metabolic Panel Recent Labs    09/28/22 0500 09/29/22 0500  NA 132* 132*  K 3.3* 3.4*  CL 88* 91*  CO2 33* 33*  GLUCOSE 103* 93  BUN 39* 37*  CREATININE 1.57* 1.50*  CALCIUM 9.6 9.5  MG 1.8 1.9    BNP: BNP (last 3 results) Recent Labs    11/20/21 1409 04/06/22 1253 09/12/22 2101  BNP 485.0* 682.5* 577.3*    ProBNP (last 3 results) Recent Labs    08/15/22 1049 09/04/22 1404 09/11/22 1530  PROBNP 5,315* 11,525* 8,444*    Imaging   No results found.  Medications:    Scheduled Medications:  acetaZOLAMIDE  250 mg Intravenous Once   apixaban  5 mg Oral BID   atorvastatin  80 mg Oral Daily   Chlorhexidine Gluconate Cloth  6 each Topical Daily   furosemide  80 mg Intravenous BID   metolazone  5 mg Oral Once   midodrine  2.5 mg Oral TID WC   senna-docusate  1 tablet Oral Daily   sodium chloride flush  3 mL Intravenous Q12H   umeclidinium bromide  1 puff Inhalation Daily   Infusions:  sodium chloride     DOBUTamine     PRN Medications: sodium chloride, acetaminophen, albuterol, guaiFENesin-dextromethorphan, magnesium hydroxide, morphine injection, ondansetron (ZOFRAN) IV, oxyCODONE, sodium chloride flush  Patient Profile  Mr Ryan Garner 77year old  with a history of CAD, STEMI 2021, DES to LCx, permanent A fib, severe MR, HTN, HLD, sleep apnea, MR, chronic lymphedema, and HFpEF.    Admitted with A/C HFpEF.  Assessment/Plan  1. Acute on chronic HFpEF: With prominent RV dysfunction.  Echo this admission shows EF 50-55%, D-shaped septum, moderate-severe RV enlargement with moderate RV dysfunction, PASP 69, severe biatrial enlargement, moderate AS with mean gradient 21 mmHg/AVA 1.2 cm^2, IVC dilated. Diuretics held 2/26 w/ ABLA from rectus sheath hematoma. Had worsening HF/dyspnea>>120 mg IV Lasix + BiPAP. Lasix infusion started 2/28. He remains on dobutamine 2.5 for RV support.   - CVP 12 likely due to excessive PO intake;  discussed importance of restricted fluid intake.  - Decrease dobutamine to 1.52mg/kg/min. Diamox 250 x1, metolazone '5mg'$  and lasix '80mg'$  IV today. Plan to discontinue dobutamine tomorrow and discharge on Monday.  - BP too low for GDMT. Continue midodrine for BP support   2. Mitral regurgitation: Severe MR by echo.  Mechanism uncertain, may be functional MR with severe LAE. Suspect this is etiology of pulmonary hypertension and RV failure.  - mTEER evaluation on hold given complicated hospital course  3. AKI on CKD 3: Suspect cardiorenal in etiology. Creatinine stable today.  - Continue dobutamine as above.   4. Atrial fibrillation: Permanent, has been seen by EP in the past. Not likely to successfully cardiovert and stay in NSR.  - Rate controlled  - Eliquis restarted on 3/2 (held for rectus sheath hematoma from heparin)   5. CAD:   Had DES to mLCx in 10/21.   - No s/s angina - Continue atorvastatin + eliquis.    6. LE wounds: Followed as outpatient by Dr. DSharol Given  ABIs normal this admission.  - Wound care - Improved   7. Left Rectus Sheath Hematoma: 15.0 x 7.4 x 14.7 cm hematoma on CT 2/26.  -Resolved   8. Anemia, acute blood loss - in setting of rectus sheath hematoma.  - hgb stable   9. Hypokalemia - 3.4 -Supp K    10. Dispo; - pending d/c to CIR next week once DBA off Ryan Garner  09/29/2022 12:57 PM

## 2022-09-29 NOTE — Progress Notes (Signed)
Mobility Specialist - Progress Note   09/29/22 0900  Mobility  Activity Ambulated with assistance in hallway  Level of Assistance Contact guard assist, steadying assist  Assistive Device Four wheel walker  Distance Ambulated (ft) 300 ft  Activity Response Tolerated well  Mobility Referral Yes  $Mobility charge 1 Mobility    Pt received in recliner and agreeable. No complaints on walk, took extended seated rest break in hallway. Left in recliner w/ call bell in reach.   [place] Specialist Please contact via SecureChat or Rehab office at [phone]

## 2022-09-30 DIAGNOSIS — I5033 Acute on chronic diastolic (congestive) heart failure: Secondary | ICD-10-CM | POA: Diagnosis not present

## 2022-09-30 LAB — BASIC METABOLIC PANEL
Anion gap: 12 (ref 5–15)
BUN: 38 mg/dL — ABNORMAL HIGH (ref 8–23)
CO2: 30 mmol/L (ref 22–32)
Calcium: 9.3 mg/dL (ref 8.9–10.3)
Chloride: 89 mmol/L — ABNORMAL LOW (ref 98–111)
Creatinine, Ser: 1.6 mg/dL — ABNORMAL HIGH (ref 0.61–1.24)
GFR, Estimated: 44 mL/min — ABNORMAL LOW (ref >60)
Glucose, Bld: 98 mg/dL (ref 70–99)
Potassium: 2.7 mmol/L — CL (ref 3.5–5.1)
Sodium: 131 mmol/L — ABNORMAL LOW (ref 135–145)

## 2022-09-30 LAB — COOXEMETRY PANEL
Carboxyhemoglobin: 3.2 % — ABNORMAL HIGH (ref 0.5–1.5)
Methemoglobin: <0.7 % (ref 0.0–1.5)
O2 Saturation: 68.6 %
Total hemoglobin: 8.6 g/dL — ABNORMAL LOW (ref 12.0–16.0)

## 2022-09-30 LAB — GLUCOSE, CAPILLARY: Glucose-Capillary: 115 mg/dL — ABNORMAL HIGH (ref 70–99)

## 2022-09-30 LAB — MAGNESIUM: Magnesium: 1.7 mg/dL (ref 1.7–2.4)

## 2022-09-30 MED ORDER — POTASSIUM CHLORIDE 10 MEQ/100ML IV SOLN
10.0000 meq | INTRAVENOUS | Status: AC
  Administered 2022-09-30 (×3): 10 meq via INTRAVENOUS
  Filled 2022-09-30 (×3): qty 100

## 2022-09-30 MED ORDER — SPIRONOLACTONE 25 MG PO TABS
25.0000 mg | ORAL_TABLET | Freq: Once | ORAL | Status: AC
  Administered 2022-09-30: 25 mg via ORAL
  Filled 2022-09-30: qty 1

## 2022-09-30 MED ORDER — POTASSIUM CHLORIDE 20 MEQ PO PACK
60.0000 meq | PACK | Freq: Once | ORAL | Status: AC
  Administered 2022-09-30: 60 meq via ORAL
  Filled 2022-09-30: qty 3

## 2022-09-30 MED ORDER — POTASSIUM CHLORIDE CRYS ER 20 MEQ PO TBCR: 40.0000 meq | EXTENDED_RELEASE_TABLET | Freq: Two times a day (BID) | ORAL | Status: DC

## 2022-09-30 MED ORDER — POTASSIUM CHLORIDE CRYS ER 20 MEQ PO TBCR: 40.0000 meq | EXTENDED_RELEASE_TABLET | Freq: Once | ORAL | Status: DC

## 2022-09-30 MED ORDER — POTASSIUM CHLORIDE CRYS ER 20 MEQ PO TBCR
40.0000 meq | EXTENDED_RELEASE_TABLET | Freq: Once | ORAL | Status: AC
  Administered 2022-09-30: 40 meq via ORAL
  Filled 2022-09-30: qty 2

## 2022-09-30 NOTE — Progress Notes (Signed)
Patient ID: Ryan Garner, male   DOB: 13-Jan-1946, 77 y.o.   MRN: [id number]     Advanced Heart Failure Rounding Note  PCP-Cardiologist: Dorris Carnes, MD   Subjective:   2/26 acute abdominal pain>>CT A/P shows large left rectus sheath hematoma. Heparin d/ced. Lasix gtt stopped.  2/27 acute HF w/ acute worsening of dyspnea>>120 mg IV Lasix + BiPAP  3.3 Lasix drip increased to '20mg'$  per hour and given metolazone.   CVP 9 this AM on dobutamine 1.88mg/kg/min; 3.5L urine output over the past 24h.    Objective:   Weight Range: 111.5 kg Body mass index is 37.38 kg/m.   Vital Signs:   Temp:  [97 F (36.1 C)-97.8 F (36.6 C)] 97.8 F (36.6 C) (03/10 0823) Pulse Rate:  [70-89] 80 (03/10 0845) Resp:  [16-20] 16 (03/10 0845) BP: (91-107)/(57-76) 92/57 (03/10 0823) SpO2:  [95 %-97 %] 96 % (03/10 0845) Weight:  [111.5 kg] 111.5 kg (03/10 0006) Last BM Date : 09/29/22  Weight change: Filed Weights   09/28/22 0103 09/29/22 0633 09/30/22 0006  Weight: 111.4 kg 111.4 kg 111.5 kg    Intake/Output:   Intake/Output Summary (Last 24 hours) at 09/30/2022 1132 Last data filed at 09/30/2022 0434 Gross per 24 hour  Intake 300 ml  Output 2650 ml  Net -2350 ml   CVP 11 Physical Exam  General:  well appearing, sitting in chair. comfortable; NAD HEENT: normal Neck: supple. JVD ~10 cm. Carotids 2+ bilat; no bruits. No lymphadenopathy or thyromegaly appreciated. Cor: PMI nondisplaced. Irregular rate & rhythm. No rubs, gallops or murmurs. Lungs: coarse on expiration Abdomen: obese, soft, nontender, nondistended. No hepatosplenomegaly. No bruits or masses. Good bowel sounds. Extremities: no cyanosis, clubbing, rash, +1 BLE edema to thigh. + UNNA boots. PICC RUE Neuro: alert & oriented x 3, cranial nerves grossly intact. moves all 4 extremities w/o difficulty. Affect pleasant.  Telemetry   A Fib 80s (Personally reviewed)  Labs  CBC Recent Labs    09/28/22 0500  WBC 6.0  HGB 8.4*  HCT 26.3*   MCV 91.0  PLT [id number]   Basic Metabolic Panel Recent Labs    09/29/22 0500 09/30/22 0603  NA 132* 131*  K 3.4* 2.7*  CL 91* 89*  CO2 33* 30  GLUCOSE 93 98  BUN 37* 38*  CREATININE 1.50* 1.60*  CALCIUM 9.5 9.3  MG 1.9 1.7    BNP: BNP (last 3 results) Recent Labs    11/20/21 1409 04/06/22 1253 09/12/22 2101  BNP 485.0* 682.5* 577.3*    ProBNP (last 3 results) Recent Labs    08/15/22 1049 09/04/22 1404 09/11/22 1530  PROBNP 5,315* 11,525* 8,444*    Imaging   No results found.  Medications:    Scheduled Medications:  apixaban  5 mg Oral BID   atorvastatin  80 mg Oral Daily   Chlorhexidine Gluconate Cloth  6 each Topical Daily   furosemide  80 mg Intravenous BID   midodrine  2.5 mg Oral TID WC   potassium chloride  40 mEq Oral BID   senna-docusate  1 tablet Oral Daily   sodium chloride flush  3 mL Intravenous Q12H   umeclidinium bromide  1 puff Inhalation Daily   Infusions:  sodium chloride     potassium chloride 10 mEq (09/30/22 1029)   PRN Medications: sodium chloride, acetaminophen, albuterol, guaiFENesin-dextromethorphan, magnesium hydroxide, morphine injection, ondansetron (ZOFRAN) IV, oxyCODONE, sodium chloride flush  Patient Profile  [name] [age]year old with a history  of CAD, STEMI 2021, DES to LCx, permanent A fib, severe MR, HTN, HLD, sleep apnea, MR, chronic lymphedema, and HFpEF.    Admitted with A/C HFpEF.  Assessment/Plan  1. Acute on chronic HFpEF: With prominent RV dysfunction.  Echo this admission shows EF 50-55%, D-shaped septum, moderate-severe RV enlargement with moderate RV dysfunction, PASP 69, severe biatrial enlargement, moderate AS with mean gradient 21 mmHg/AVA 1.2 cm^2, IVC dilated. Diuretics held 2/26 w/ ABLA from rectus sheath hematoma. Had worsening HF/dyspnea>>120 mg IV Lasix + BiPAP. Lasix infusion started 2/28. He remains on dobutamine 2.5 for RV support.   - BP too low for GDMT. Continue midodrine for BP support  -  Discontinue dobutamine today.  - Continue IV lasix today, transition to torsemide tomorrow (required metolazone and diamox through the weekend).  - Plan for discharge to CIR vs home.   2. Mitral regurgitation: Severe MR by echo.  Mechanism uncertain, may be functional MR with severe LAE. Suspect this is etiology of pulmonary hypertension and RV failure.  - mTEER evaluation on hold given complicated hospital course  3. AKI on CKD 3: Suspect cardiorenal in etiology. Creatinine stable today.  - D/C dobutamine.  - Repleting potassium; sCr 1.6.   4. Atrial fibrillation: Permanent, has been seen by EP in the past. Not likely to successfully cardiovert and stay in NSR.  - Rate controlled  - Eliquis restarted on 3/2 (held for rectus sheath hematoma from heparin)   5. CAD:   Had DES to mLCx in 10/21.   - No s/s angina - Continue atorvastatin + eliquis.    6. LE wounds: Followed as outpatient by Dr. Sharol Given.  ABIs normal this admission.  - Wound care - Improved   7. Left Rectus Sheath Hematoma: 15.0 x 7.4 x 14.7 cm hematoma on CT 2/26.  -Resolved   8. Anemia, acute blood loss - in setting of rectus sheath hematoma.  - hgb stable   9. Hypokalemia - 2.7 -Supp K    10. Dispo; - pending d/c to CIR next week once DBA off  Ryan Garner  09/30/2022 11:32 AM

## 2022-10-01 ENCOUNTER — Telehealth (HOSPITAL_COMMUNITY): Payer: Self-pay

## 2022-10-01 ENCOUNTER — Other Ambulatory Visit (HOSPITAL_COMMUNITY): Payer: Self-pay

## 2022-10-01 ENCOUNTER — Other Ambulatory Visit: Payer: Self-pay

## 2022-10-01 DIAGNOSIS — I5033 Acute on chronic diastolic (congestive) heart failure: Secondary | ICD-10-CM | POA: Diagnosis not present

## 2022-10-01 LAB — GLUCOSE, CAPILLARY
Glucose-Capillary: 100 mg/dL — ABNORMAL HIGH (ref 70–99)
Glucose-Capillary: 105 mg/dL — ABNORMAL HIGH (ref 70–99)
Glucose-Capillary: 111 mg/dL — ABNORMAL HIGH (ref 70–99)
Glucose-Capillary: 153 mg/dL — ABNORMAL HIGH (ref 70–99)

## 2022-10-01 LAB — BASIC METABOLIC PANEL
Anion gap: 11 (ref 5–15)
BUN: 41 mg/dL — ABNORMAL HIGH (ref 8–23)
CO2: 30 mmol/L (ref 22–32)
Calcium: 9.5 mg/dL (ref 8.9–10.3)
Chloride: 90 mmol/L — ABNORMAL LOW (ref 98–111)
Creatinine, Ser: 1.63 mg/dL — ABNORMAL HIGH (ref 0.61–1.24)
GFR, Estimated: 43 mL/min — ABNORMAL LOW (ref >60)
Glucose, Bld: 101 mg/dL — ABNORMAL HIGH (ref 70–99)
Potassium: 3.3 mmol/L — ABNORMAL LOW (ref 3.5–5.1)
Sodium: 131 mmol/L — ABNORMAL LOW (ref 135–145)

## 2022-10-01 LAB — COOXEMETRY PANEL
Carboxyhemoglobin: 2.8 % — ABNORMAL HIGH (ref 0.5–1.5)
Methemoglobin: <0.7 % (ref 0.0–1.5)
O2 Saturation: 59.8 %
Total hemoglobin: 9.2 g/dL — ABNORMAL LOW (ref 12.0–16.0)

## 2022-10-01 LAB — MAGNESIUM: Magnesium: 1.8 mg/dL (ref 1.7–2.4)

## 2022-10-01 MED ORDER — TORSEMIDE 20 MG PO TABS
80.0000 mg | ORAL_TABLET | Freq: Two times a day (BID) | ORAL | Status: DC
  Administered 2022-10-01 – 2022-10-02 (×3): 80 mg via ORAL
  Filled 2022-10-01 (×4): qty 4

## 2022-10-01 MED ORDER — SPIRONOLACTONE 12.5 MG HALF TABLET
12.5000 mg | ORAL_TABLET | Freq: Every day | ORAL | Status: DC
  Administered 2022-10-01 – 2022-10-11 (×11): 12.5 mg via ORAL
  Filled 2022-10-01 (×11): qty 1

## 2022-10-01 MED ORDER — SODIUM CHLORIDE 0.9 % IV SOLN: INTRAVENOUS | Status: DC

## 2022-10-01 MED ORDER — POTASSIUM CHLORIDE CRYS ER 20 MEQ PO TBCR
40.0000 meq | EXTENDED_RELEASE_TABLET | ORAL | Status: AC
  Administered 2022-10-01 (×2): 40 meq via ORAL
  Filled 2022-10-01 (×2): qty 2

## 2022-10-01 MED ORDER — DAPAGLIFLOZIN PROPANEDIOL 10 MG PO TABS
10.0000 mg | ORAL_TABLET | Freq: Every day | ORAL | Status: DC
  Administered 2022-10-01 – 2022-10-02 (×2): 10 mg via ORAL
  Filled 2022-10-01 (×3): qty 1

## 2022-10-01 MED ORDER — MAGNESIUM SULFATE 2 GM/50ML IV SOLN
2.0000 g | Freq: Once | INTRAVENOUS | Status: AC
  Administered 2022-10-01: 2 g via INTRAVENOUS
  Filled 2022-10-01: qty 50

## 2022-10-01 NOTE — Progress Notes (Signed)
Physical Therapy Treatment Patient Details Name: Ryan Garner MRN: [id number] DOB: 07-Oct-1945 Today's Date: 10/01/2022   History of Present Illness Pt is a 77 y.o. M who presents 09/12/2022 with A/C HFpEF. 2/26 acute abdominal pain>>CT A/P shows large left rectus sheath hematoma.Significant PMH: CAD, STEMI 2021, DES to LCx, permanent A fib, severe MR, HTN, HLD, sleep apnea, DM, rotator cuff repair, MR, chronic lymphedema, and HFpEF.    PT Comments    Pt tolerated treatment well today. Pt was able to increase ambulation distance in hallway however still required cues for obstacle management and safety with rollator. Pt is also still limited by L arm pain and fatigue. Pt reports being scheduled for catheterization tomorrow. No change in DC/DME recs as patient would still benefit from CIR to maximize pt functional independence.    Recommendations for follow up therapy are one component of a multi-disciplinary discharge planning process, led by the attending physician.  Recommendations may be updated based on patient status, additional functional criteria and insurance authorization.  Follow Up Recommendations  Acute inpatient rehab (3hours/day) Can patient physically be transported by private vehicle: No   Assistance Recommended at Discharge Frequent or constant Supervision/Assistance  Patient can return home with the following A lot of help with walking and/or transfers;Assistance with cooking/housework;Help with stairs or ramp for entrance;Assist for transportation   Equipment Recommendations  Other (comment) (Per accepting facility)    Recommendations for Other Services       Precautions / Restrictions Precautions Precautions: Fall Restrictions Weight Bearing Restrictions: No     Mobility  Bed Mobility               General bed mobility comments: in chair upon arrival    Transfers Overall transfer level: Needs assistance Equipment used: Rollator (4 wheels) Transfers:  Sit to/from Stand Sit to Stand: Supervision, Min guard           General transfer comment: Pt initially was supervision for sit to stand but once fatigued pt required min guard.    Ambulation/Gait Ambulation/Gait assistance: Min guard (for obstacle management and) Gait Distance (Feet): 600 Feet (1 very prolonged seated rest break. Pt was visibly fatigued once seated in chair.) Assistive device: Rollator (4 wheels) Gait Pattern/deviations: Step-through pattern, Decreased stride length Gait velocity: decreased     General Gait Details: No LOB noted. Pt needed verbal cues for safety and using brakes on rollator.   Stairs             Wheelchair Mobility    Modified Rankin (Stroke Patients Only)       Balance Overall balance assessment: Mild deficits observed, not formally tested                                          Cognition Arousal/Alertness: Awake/alert Behavior During Therapy: Flat affect Overall Cognitive Status: Within Functional Limits for tasks assessed                                 General Comments: Pt continues to minimally conversive and speaks very softly.        Exercises      General Comments General comments (skin integrity, edema, etc.): VSS on RA. Pt in NAD      Pertinent Vitals/Pain Pain Assessment Pain Assessment: Faces Faces Pain Scale: Hurts little more  Pain Location: L arm Pain Descriptors / Indicators: Cramping, Constant, Grimacing Pain Intervention(s): Monitored during session    Home Living                          Prior Function            PT Goals (current goals can now be found in the care plan section) Progress towards PT goals: Progressing toward goals    Frequency    Min 3X/week      PT Plan Current plan remains appropriate    Co-evaluation              AM-PAC PT "6 Clicks" Mobility   Outcome Measure  Help needed turning from your back to your side  while in a flat bed without using bedrails?: A Lot Help needed moving from lying on your back to sitting on the side of a flat bed without using bedrails?: A Lot Help needed moving to and from a bed to a chair (including a wheelchair)?: A Little Help needed standing up from a chair using your arms (e.g., wheelchair or bedside chair)?: A Little Help needed to walk in hospital room?: A Little Help needed climbing 3-5 steps with a railing? : Total 6 Click Score: 14    End of Session Equipment Utilized During Treatment: Gait belt Activity Tolerance: Patient tolerated treatment well Patient left: in chair;with call bell/phone within reach;with nursing/sitter in room Nurse Communication: Mobility status PT Visit Diagnosis: Muscle weakness (generalized) (M62.81);Difficulty in walking, not elsewhere classified (R26.2)     Time: [id number] PT Time Calculation (min) (ACUTE ONLY): 34 min  Charges:  $Gait Training: 23-37 mins                     Shelby Mattocks, PT, DPT Acute Rehab Services [id number]    Viann Shove 10/01/2022, 12:27 PM

## 2022-10-01 NOTE — Plan of Care (Signed)

## 2022-10-01 NOTE — Progress Notes (Signed)
Inpatient Rehab Admissions Coordinator:   I spoke with Pt. Regarding plan for CIR vs home. States he feels he can safely d/c home with his nephew Sonia Side after cardiac cath tomorrow. I will let TOC know that this is what Pt. Wants.   Clemens Catholic, [place], [place] Admissions Coordinator  [phone] ([place]) [phone] (office)

## 2022-10-01 NOTE — Progress Notes (Signed)
Orthopedic Tech Progress Note Patient Details:  Ryan Garner [birth year] [id number]  Ortho Devices Type of Ortho Device: Haematologist Ortho Device/Splint Location: BLE Ortho Device/Splint Interventions: Ordered, Application   Post Interventions Patient Tolerated: Well Instructions Provided: Care of device  Shashank Kwasnik A Mateo Overbeck 10/01/2022, 4:40 PM

## 2022-10-01 NOTE — Telephone Encounter (Signed)
Patient Advocate Encounter  Application for Eliquis faxed to [employer] on 10/01/22. Application form attached to patient chart.  Clista Bernhardt, CPhT Rx Patient Advocate Phone: [phone]

## 2022-10-01 NOTE — Telephone Encounter (Signed)
Patient Advocate Encounter  Application for [place] faxed to [employer] on 10/01/22. Application form attached to patient chart.  Clista Bernhardt, CPhT Rx Patient Advocate Phone: [phone]

## 2022-10-01 NOTE — Progress Notes (Signed)
Pt states he can place self on home cpap machine.

## 2022-10-01 NOTE — Progress Notes (Addendum)
Patient ID: Ryan Garner, male   DOB: 02-27-1946, 77 y.o.   MRN: [id number]     Advanced Heart Failure Rounding Note  PCP-Cardiologist: Dorris Carnes, MD   Subjective:   2/26 acute abdominal pain>>CT A/P shows large left rectus sheath hematoma. Heparin d/ced. Lasix gtt stopped.  2/27 acute HF w/ acute worsening of dyspnea>>120 mg IV Lasix + BiPAP 3/3 Lasix drip increased to '20mg'$  per hour and given metolazone.   CVP 12 this AM, now off DBA (3/10); 2.1L urine output over the past 24h.   Feels good this morning, up in chair. Denies CP/SOB.    Objective:   Weight Range: 108.5 kg Body mass index is 36.37 kg/m.   Vital Signs:   Temp:  [97.5 F (36.4 C)-98.5 F (36.9 C)] 97.5 F (36.4 C) (03/11 0730) Pulse Rate:  [79-92] 88 (03/11 0730) Resp:  [16-18] 17 (03/11 0730) BP: (87-108)/(57-75) 97/73 (03/11 0730) SpO2:  [98 %-100 %] 100 % (03/11 0730) Weight:  [108.5 kg] 108.5 kg (03/11 0007) Last BM Date : 09/30/22  Weight change: Filed Weights   09/29/22 0633 09/30/22 0006 10/01/22 0007  Weight: 111.4 kg 111.5 kg 108.5 kg    Intake/Output:   Intake/Output Summary (Last 24 hours) at 10/01/2022 0900 Last data filed at 10/01/2022 0828 Gross per 24 hour  Intake 1180.68 ml  Output 2150 ml  Net -969.32 ml  CVP 12 Physical Exam  General:  chronically ill appearing.  No respiratory difficulty HEENT: normal Neck: supple. JVD ~10 cm. Carotids 2+ bilat; no bruits. No lymphadenopathy or thyromegaly appreciated. Cor: PMI nondisplaced. Regular rate & rhythm. No rubs, gallops or murmurs. Lungs: clear Abdomen: obese ,soft, nontender, nondistended. No hepatosplenomegaly. No bruits or masses. Good bowel sounds. Extremities: no cyanosis, clubbing, rash, +1 BLE edema, +UNNA boots, PICC RUE Neuro: alert & oriented x 3, cranial nerves grossly intact. moves all 4 extremities w/o difficulty. Affect pleasant.   Telemetry   A Fib 90s, short run NSVT this am (Personally reviewed)  Labs  CBC No  results for input(s): "WBC", "NEUTROABS", "HGB", "HCT", "MCV", "PLT" in the last 72 hours. Basic Metabolic Panel Recent Labs    09/30/22 0603 10/01/22 0506  NA 131* 131*  K 2.7* 3.3*  CL 89* 90*  CO2 30 30  GLUCOSE 98 101*  BUN 38* 41*  CREATININE 1.60* 1.63*  CALCIUM 9.3 9.5  MG 1.7 1.8   BNP: BNP (last 3 results) Recent Labs    11/20/21 1409 04/06/22 1253 09/12/22 2101  BNP 485.0* 682.5* 577.3*   ProBNP (last 3 results) Recent Labs    08/15/22 1049 09/04/22 1404 09/11/22 1530  PROBNP 5,315* 11,525* 8,444*   Imaging   No results found.  Medications:    Scheduled Medications:  apixaban  5 mg Oral BID   atorvastatin  80 mg Oral Daily   Chlorhexidine Gluconate Cloth  6 each Topical Daily   furosemide  80 mg Intravenous BID   midodrine  2.5 mg Oral TID WC   senna-docusate  1 tablet Oral Daily   sodium chloride flush  3 mL Intravenous Q12H   umeclidinium bromide  1 puff Inhalation Daily   Infusions:  sodium chloride     PRN Medications: sodium chloride, acetaminophen, albuterol, guaiFENesin-dextromethorphan, magnesium hydroxide, morphine injection, ondansetron (ZOFRAN) IV, oxyCODONE, sodium chloride flush  Patient Profile  Ryan Garner is 77 year old with a history of CAD, STEMI 2021, DES to LCx, permanent A fib, severe Ryan, HTN, HLD, sleep apnea, Ryan, chronic lymphedema,  and HFpEF.    Admitted with A/C HFpEF.  Assessment/Plan  1. Acute on chronic HFpEF: With prominent RV dysfunction.  Echo this admission shows EF 50-55%, D-shaped septum, moderate-severe RV enlargement with moderate RV dysfunction, PASP 69, severe biatrial enlargement, moderate AS with mean gradient 21 mmHg/AVA 1.2 cm^2, IVC dilated. Diuretics held 2/26 w/ ABLA from rectus sheath hematoma. Had worsening HF/dyspnea>>120 mg IV Lasix + BiPAP. Lasix infusion started 2/28. Now off dobutamine for RV support.   - BP too low for GDMT. Continue midodrine for BP support  - DBA d/c'd 3/10 - s/p IV lasix  '80mg'$  this am. Will transition to po torsemide '80mg'$  BID to start this evening, was on '120mg'$  PO lasix BID prior. Required metolazone and diamox through the weekend, may need PRN at d/c. Can consider 2.5 metolazone daily as well.  - Plan for discharge to CIR   2. Mitral regurgitation: Severe Ryan by echo.  Mechanism uncertain, may be functional Ryan with severe LAE. Suspect this is etiology of pulmonary hypertension and RV failure.  - mTEER evaluation on hold given complicated hospital course  3. AKI on CKD 3: Suspect cardiorenal in etiology. Creatinine stable today.  - D/C dobutamine.  - Repleting potassium; sCr 1.6.   4. Atrial fibrillation: Permanent, has been seen by EP in the past. Not likely to successfully cardiovert and stay in NSR.  - Rate controlled  - Eliquis restarted on 3/2 (held for rectus sheath hematoma from heparin)   5. CAD:   Had DES to mLCx in 10/21.   - No s/s angina - Continue atorvastatin + eliquis.    6. LE wounds: Followed as outpatient by Dr. Sharol Given.  ABIs normal this admission.  - Wound care - Improved   7. Left Rectus Sheath Hematoma: 15.0 x 7.4 x 14.7 cm hematoma on CT 2/26.  -Resolved   8. Anemia, acute blood loss - in setting of rectus sheath hematoma.  - hgb stable   9. Hypokalemia - 2.7>3.3 -Supp K    10. Dispo; - pending d/c to [place] AGACNP-BC  10/01/2022 9:00 AM  Patient seen with NP, agree with the above note.   Weight down another 6 lbs.  He is now off dobutamine with stable creatinine 1.6.  CVP 12 on my read.   No complaints, says he has walked in hall.   General: NAD Neck: JVP 9-10 cm, no thyromegaly or thyroid nodule.  Lungs: Clear to auscultation bilaterally with normal respiratory effort. CV: Nondisplaced PMI.  Heart irregular S1/S2, no S3/S4, 3/6 HSM apex.  1+ ankle edema.   Abdomen: Soft, nontender, no hepatosplenomegaly, no distention.  Skin: Intact without lesions or rashes.  Neurologic: Alert and oriented x 3.   Psych: Normal affect. Extremities: No clubbing or cyanosis.  HEENT: Normal.   Acute diastolic CHF in setting of severe mitral regurgitation (also with moderate AS).  He has diuresed extensively (>60 lbs).  Was on dobutamine, now off. CVP 12 today.  - Lasix 80 mg IV x 1 this morning.  - Start torsemide 80 mg bid this afternoon.  - Continue spironolactone at 12.5 daily.  - Add Farxiga 10 mg daily.  - Needs TEE to assess mitral regurgitation, ?mTEER candidate.  Will try to arrange for tomorrow.  Discussed risk/benefits with patient and he agrees to procedure.   He remains on Eliquis for AF, resolved rectus sheath hematoma.  - CBC in am.  He wants to go home but looks like CIR was recommended.  Will ask PT to see again today.   Loralie Champagne 10/01/2022 10:17 AM

## 2022-10-02 ENCOUNTER — Encounter (HOSPITAL_COMMUNITY)
Admission: RE | Disposition: A | Discharge status: Home / Self Care | Payer: Self-pay | Source: Ambulatory Visit | Attending: Internal Medicine

## 2022-10-02 ENCOUNTER — Inpatient Hospital Stay (HOSPITAL_COMMUNITY): Payer: Medicare Other

## 2022-10-02 ENCOUNTER — Inpatient Hospital Stay (HOSPITAL_COMMUNITY): Payer: Medicare Other | Admitting: Anesthesiology

## 2022-10-02 ENCOUNTER — Encounter (HOSPITAL_COMMUNITY): Payer: Self-pay | Admitting: Internal Medicine

## 2022-10-02 ENCOUNTER — Telehealth: Payer: Self-pay

## 2022-10-02 ENCOUNTER — Ambulatory Visit: Payer: Medicare Other | Admitting: Orthopedic Surgery

## 2022-10-02 DIAGNOSIS — I340 Nonrheumatic mitral (valve) insufficiency: Secondary | ICD-10-CM

## 2022-10-02 DIAGNOSIS — I252 Old myocardial infarction: Secondary | ICD-10-CM | POA: Diagnosis not present

## 2022-10-02 DIAGNOSIS — I5033 Acute on chronic diastolic (congestive) heart failure: Secondary | ICD-10-CM | POA: Diagnosis not present

## 2022-10-02 DIAGNOSIS — Z87891 Personal history of nicotine dependence: Secondary | ICD-10-CM

## 2022-10-02 DIAGNOSIS — I2510 Atherosclerotic heart disease of native coronary artery without angina pectoris: Secondary | ICD-10-CM

## 2022-10-02 DIAGNOSIS — I509 Heart failure, unspecified: Secondary | ICD-10-CM

## 2022-10-02 DIAGNOSIS — I110 Hypertensive heart disease with heart failure: Secondary | ICD-10-CM | POA: Diagnosis not present

## 2022-10-02 HISTORY — PX: BUBBLE STUDY: SHX6837

## 2022-10-02 HISTORY — PX: TEE WITHOUT CARDIOVERSION: SHX5443

## 2022-10-02 LAB — CBC
HCT: 27.7 % — ABNORMAL LOW (ref 39.0–52.0)
Hemoglobin: 8.9 g/dL — ABNORMAL LOW (ref 13.0–17.0)
MCH: 29.6 pg (ref 26.0–34.0)
MCHC: 32.1 g/dL (ref 30.0–36.0)
MCV: 92 fL (ref 80.0–100.0)
Platelets: 355 10*3/uL (ref 150–400)
RBC: 3.01 MIL/uL — ABNORMAL LOW (ref 4.22–5.81)
RDW: 17.2 % — ABNORMAL HIGH (ref 11.5–15.5)
WBC: 6.8 10*3/uL (ref 4.0–10.5)
nRBC: 0 % (ref 0.0–0.2)

## 2022-10-02 LAB — BASIC METABOLIC PANEL
Anion gap: 12 (ref 5–15)
BUN: 47 mg/dL — ABNORMAL HIGH (ref 8–23)
CO2: 29 mmol/L (ref 22–32)
Calcium: 9.3 mg/dL (ref 8.9–10.3)
Chloride: 91 mmol/L — ABNORMAL LOW (ref 98–111)
Creatinine, Ser: 1.69 mg/dL — ABNORMAL HIGH (ref 0.61–1.24)
GFR, Estimated: 41 mL/min — ABNORMAL LOW (ref >60)
Glucose, Bld: 128 mg/dL — ABNORMAL HIGH (ref 70–99)
Potassium: 3.3 mmol/L — ABNORMAL LOW (ref 3.5–5.1)
Sodium: 132 mmol/L — ABNORMAL LOW (ref 135–145)

## 2022-10-02 LAB — ECHO TEE
AR max vel: 1.55 cm2
AV Area VTI: 1.35 cm2
AV Area mean vel: 1.53 cm2
AV Mean grad: 15 mmHg
AV Peak grad: 23.9 mmHg
Ao pk vel: 2.44 m/s
Est EF: 55
MV M vel: 5.07 m/s
MV Peak grad: 102.8 mmHg
MV VTI: 3.48 cm2
Radius: 1.1 cm

## 2022-10-02 LAB — COOXEMETRY PANEL
Carboxyhemoglobin: 2.3 % — ABNORMAL HIGH (ref 0.5–1.5)
Methemoglobin: <0.7 % (ref 0.0–1.5)
O2 Saturation: 69.3 %
Total hemoglobin: 9.1 g/dL — ABNORMAL LOW (ref 12.0–16.0)

## 2022-10-02 LAB — GLUCOSE, CAPILLARY
Glucose-Capillary: 117 mg/dL — ABNORMAL HIGH (ref 70–99)
Glucose-Capillary: 95 mg/dL (ref 70–99)
Glucose-Capillary: 132 mg/dL — ABNORMAL HIGH (ref 70–99)

## 2022-10-02 LAB — MAGNESIUM: Magnesium: 2 mg/dL (ref 1.7–2.4)

## 2022-10-02 SURGERY — ECHOCARDIOGRAM, TRANSESOPHAGEAL
Anesthesia: Monitor Anesthesia Care

## 2022-10-02 MED ORDER — PHENYLEPHRINE 80 MCG/ML (10ML) SYRINGE FOR IV PUSH (FOR BLOOD PRESSURE SUPPORT)
PREFILLED_SYRINGE | INTRAVENOUS | Status: DC | PRN
  Administered 2022-10-02 (×3): 80 ug via INTRAVENOUS
  Administered 2022-10-02 (×2): 160 ug via INTRAVENOUS
  Administered 2022-10-02 (×2): 80 ug via INTRAVENOUS

## 2022-10-02 MED ORDER — METOLAZONE 2.5 MG PO TABS
2.5000 mg | ORAL_TABLET | Freq: Once | ORAL | Status: AC
  Administered 2022-10-02: 2.5 mg via ORAL
  Filled 2022-10-02: qty 1

## 2022-10-02 MED ORDER — PROPOFOL 500 MG/50ML IV EMUL
INTRAVENOUS | Status: DC | PRN
  Administered 2022-10-02 (×2): 100 ug/kg/min via INTRAVENOUS

## 2022-10-02 MED ORDER — POTASSIUM CHLORIDE CRYS ER 20 MEQ PO TBCR
60.0000 meq | EXTENDED_RELEASE_TABLET | ORAL | Status: AC
  Administered 2022-10-02 (×2): 60 meq via ORAL
  Filled 2022-10-02 (×2): qty 3

## 2022-10-02 MED ORDER — PROPOFOL 10 MG/ML IV BOLUS
INTRAVENOUS | Status: DC | PRN
  Administered 2022-10-02: 10 mg via INTRAVENOUS

## 2022-10-02 NOTE — Plan of Care (Signed)

## 2022-10-02 NOTE — Progress Notes (Signed)
Pt can place self on home cpap machine. 

## 2022-10-02 NOTE — Consult Note (Addendum)
[place] VALVE TEAM  Inpatient MitraClip Consultation:   Patient ID: Ryan Garner; [id number]; 1946/01/30   Admit date: 09/12/2022 Date of Consult: 10/02/2022  Primary Care Provider: [name], [name], MD Primary Cardiologist: Dr. Dorris Carnes, MD  Patient Profile:   Ryan Garner is a 77 y.o. male with a hx of CAD s/p STEMI with DES to LCx in 2021, permanent atrial fibrillation, HTN, HLD, sleep apnea, chronic lymphedema, HFpEF, and severe MR who is being seen today for the evaluation of his severe MR and consideration of TEER at the request of Dr. Aundra Dubin.  History of Present Illness:   Mr. Groesbeck is a 77yo M who lives alone in [place]. 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 Prior to coming to the hospital he reports he was living alone, driving, and caring for himself without much issue. He ambulates with a rolling walker and is able to perform all ADL/IADLs alone. He has been told that he has had a murmur for quite some time. Appears that in 2021 MR was in the mild range on echocardiogram with mild AS. He has had trouble with fluid overload for since the beginning of 2023. Today he is appears alert and is sitting up in the chair. He denies chest pain, SOB, palpitations, dizziness, or syncope. He has chronic LE edema. He was previously treated with infected LE cellulitis with abscesses requiring surgical treatment and wound vac placement with Dr. Sharol Given 03/2022. This has since healed and he currently has Una boots in place given profound edema on admission.   Mr. Camilli is followed by Dr. Harrington Challenger for his cardiology care. He was recently admitted to [facility] 06/2022 with acute on chronic HF. He was diuresed with IV lasix and transitioned to PO by discharged. In follow up with Dr. Harrington Challenger he was noted to be markedly fluid overloaded therefore he was admitted once again with a plan for cardiac catheterization and echocardiogram. On arrival CXR  showed pleural effusions with relatively stable labs with BNP at 577, Cr 1.3, and K+ 5.4. AHF team was consulted due to hypotension and poor response to high dose IV lasix. He was started on dobutamine + IV lasix at that time. Echocardiogram showed low normal LVEF at 50-55% with D-shaped septum, moderate-severe RV enlargement with moderate RV dysfunction, PASP 69, severe biatrial enlargement, severe functional MR with restricted posterior leaflet, EROA 0.51cm2, RVol 36m with moderate TR and moderate AS with AVA at 1.20, and mean gradient at 25mg. [facility]. He was noted to have profound abdominal pain and found to have a rectus sheath hematoma on CT. Heparin was stopped and Hb has since stabilized. His fluid volume status is now improving. His CVP's have been in the 12-15 range and he is now off dobutamine infusion. TEE performed today which confirmed severe MR with normal left ventricular size with EF 55%, mildly D-shaped interventricular septum suggestive of RV pressure/volume overload, moderate RV dilation with moderately decreased RV systolic function. Mild TR, severe left atrial enlargement, no LA appendage thrombus, no PFO/ASD by bubble study, tricuspid aortic valve with severe calcification, moderate AS by planimetry/VTI and severe mitral regurgitation with inadequate coaptation and restriction of posterior leaflet, suspect atrial functional MR. ERO 0.54 cm^2 by PISA, vena contracta area 0.52 cm^2. There was slight flow reversal in all 4 pulmonary veins. Mean gradient 3 mmHg.  Past Medical History:  Diagnosis Date   [place]-fib ([place]  Acute on chronic diastolic CHF (congestive heart failure) ([place]) 07/21/2020   Coronary artery disease    Diabetes mellitus without complication (HCC)    Dyspnea    Dysrhythmia    A-fib   Hyperlipidemia    Hypertension    Metabolic syndrome    Prediabetes    Sleep apnea    STEMI (ST elevation myocardial infarction) [facility])     Past  Surgical History:  Procedure Laterality Date   APPENDECTOMY     CHOLECYSTECTOMY N/A 03/02/2020   Procedure: LAPAROSCOPIC CHOLECYSTECTOMY;  Surgeon: Aviva Signs, MD;  Location: [facility];  Service: General;  Laterality: N/A;   CORONARY STENT INTERVENTION N/A 04/30/2020   Procedure: CORONARY STENT INTERVENTION;  Surgeon: Martinique, Peter M, MD;  Location: [place] CV LAB;  Service: Cardiovascular;  Laterality: N/A;   CORONARY/GRAFT ACUTE MI REVASCULARIZATION N/A 04/30/2020   Procedure: Coronary/Graft Acute MI Revascularization;  Surgeon: Martinique, Peter M, MD;  Location: [place] CV LAB;  Service: Cardiovascular;  Laterality: N/A;   Eyelid Surgery Bilateral    HERNIA REPAIR     I & D EXTREMITY Left 04/06/2022   Procedure: LEFT LEG DEBRIDEMENT;  Surgeon: Newt Minion, MD;  Location: [place];  Service: Orthopedics;  Laterality: Left;   I & D EXTREMITY Left 04/04/2022   Procedure: LEFT LEG DEBRIDEMENT;  Surgeon: Newt Minion, MD;  Location: [place];  Service: Orthopedics;  Laterality: Left;   knee tendons repair     LEFT HEART CATH AND CORONARY ANGIOGRAPHY N/A 04/30/2020   Procedure: LEFT HEART CATH AND CORONARY ANGIOGRAPHY;  Surgeon: Martinique, Peter M, MD;  Location: [place] CV LAB;  Service: Cardiovascular;  Laterality: N/A;   ROTATOR CUFF REPAIR Bilateral    TONSILLECTOMY       Inpatient Medications: Scheduled Meds:  [MAR Hold] apixaban  5 mg Oral BID   [MAR Hold] atorvastatin  80 mg Oral Daily   [MAR Hold] Chlorhexidine Gluconate Cloth  6 each Topical Daily   [MAR Hold] dapagliflozin propanediol  10 mg Oral Daily   [MAR Hold] metolazone  2.5 mg Oral Once   [MAR Hold] midodrine  2.5 mg Oral TID WC   [MAR Hold] potassium chloride  60 mEq Oral Q4H   [MAR Hold] senna-docusate  1 tablet Oral Daily   [MAR Hold] sodium chloride flush  3 mL Intravenous Q12H   [MAR Hold] spironolactone  12.5 mg Oral Daily   [MAR Hold] torsemide  80 mg Oral BID   [MAR Hold] umeclidinium bromide  1 puff Inhalation  Daily   Continuous Infusions:  [MAR Hold] sodium chloride     sodium chloride Stopped (10/02/22 1414)   PRN Meds: [MAR Hold] sodium chloride, [MAR Hold] acetaminophen, [MAR Hold] albuterol, [MAR Hold] guaiFENesin-dextromethorphan, [MAR Hold] magnesium hydroxide, [MAR Hold]  morphine injection, [MAR Hold] ondansetron (ZOFRAN) IV, [MAR Hold] oxyCODONE, [MAR Hold] sodium chloride flush  Allergies:    Allergies  Allergen Reactions   Flecainide     Per patient dizziness, shortness of breath, blurred vision   Metoprolol Tartrate Itching   Rofecoxib Rash and Other (See Comments)    (Vioxx)    Social History:   Social History   Socioeconomic History   Marital status: Widowed    Spouse name: [name]   Number of children: 2   Years of education: 20   Highest education level: Doctorate  Occupational History   Occupation: Theme park manager    Comment: 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  Tobacco Use   Smoking status: Former    Types: Cigarettes    Quit date: 04/02/1959    Years since quitting: 63.5   Smokeless tobacco: Never  Vaping Use   Vaping Use: Never used  Substance and Sexual Activity   Alcohol use: No   Drug use: No   Sexual activity: Yes  Other Topics Concern   Not on file  Social History Narrative   Not on file   Social Determinants of Health   Financial Resource Strain: Low Risk  (07/04/2022)   Overall Financial Resource Strain (CARDIA)    Difficulty of Paying Living Expenses: Not hard at all  Food Insecurity: No Food Insecurity (07/25/2022)   Hunger Vital Sign    Worried About Running Out of Food in the Last Year: Never true    Ran Out of Food in the Last Year: Never true  Transportation Needs: No Transportation Needs (07/25/2022)   PRAPARE - Hydrologist (Medical): No    Lack of Transportation (Non-Medical): No  Physical Activity: Insufficiently Active (07/04/2022)   Exercise Vital Sign    Days of  Exercise per Week: 3 days    Minutes of Exercise per Session: 30 min  Stress: No Stress Concern Present (07/04/2022)   [place]    Feeling of Stress : Not at all  Social Connections: [place] (07/04/2022)   Social Connection and Isolation Panel [NHANES]    Frequency of Communication with Friends and Family: More than three times a week    Frequency of Social Gatherings with Friends and Family: More than three times a week    Attends Religious Services: More than 4 times per year    Active Member of [employer] or Organizations: Yes    Attends Music therapist: More than 4 times per year    Marital Status: Married  Human resources officer Violence: Not At Risk (07/04/2022)   Humiliation, Afraid, Rape, and Kick questionnaire    Fear of Current or Ex-Partner: No    Emotionally Abused: No    Physically Abused: No    Sexually Abused: No    Family History:   The patient's family history includes Arthritis in his father; Cancer in his mother; Coronary artery disease in his mother; Diabetes in his brother and maternal grandmother; Heart attack in his brother.  ROS:  Please see the history of present illness.  ROS  All other ROS reviewed and negative.     Physical Exam/Data:   Vitals:   10/02/22 1400 10/02/22 1410 10/02/22 1415 10/02/22 1417  BP: 93/66 1'05/75 97/75 95/75 '$  Pulse: 85 85 79 75  Resp: (!) '22 18 13 14  '$ Temp:      TempSrc:      SpO2: 100% 97% 98% 98%  Weight:      Height:        Intake/Output Summary (Last 24 hours) at 10/02/2022 1437 Last data filed at 10/02/2022 0900 Gross per 24 hour  Intake 562.23 ml  Output 2550 ml  Net -1987.77 ml   Filed Weights   10/01/22 0818 10/02/22 0714 10/02/22 1201  Weight: 108.5 kg 106.2 kg 106.2 kg   Body mass index is 35.6 kg/m.   General: Obese, NAD Neck: Negative for carotid bruits. + JVD Lungs: Upper lobe rhonchi. Breathing is  unlabored. Cardiovascular: Irregularly irregular. + murmurs Abdomen: Soft, non-tender, non-distended with no obvious abdominal masses. Extremities: 2-3+ BLE. Neuro: Alert and oriented.  No focal deficits. No facial asymmetry. MAE spontaneously. Psych: Responds to questions appropriately with normal affect.    Telemetry:  Telemetry was personally reviewed and demonstrates:  AF with HR in the upper 90's  Relevant CV Studies:  TEE 10/02/22:   Procedure: TEE   Indication: Mitral regurgitation   Sedation: Per anesthesiology   Findings: Please see echo section for full report.  Normal left ventricular size with EF 55%.  Normal wall motion.   Mildly D-shaped interventricular septum suggestive of RV pressure/volume overload.  Moderate RV dilation with moderately decreased RV systolic function.  Mild TR, peak RV-RA gradient 24 mmHg.  Severe left atrial enlargement, no LA appendage thrombus.  Severe right atrial enlargement.  No PFO/ASD by bubble study.  Tricuspid aortic valve with severe calcification.  No aortic insufficiency, moderate AS by planimetry and VTI.  There was severe mitral regurgitation and no stenosis.  Inadequate coaptation with restriction of posterior leaflet, suspect atrial functional MR.  ERO 0.54 cm^2 by PISA, vena contracta area 0.52 cm^2.  There was slight flow reversal in all 4 pulmonary veins.  Mean gradient 3 mmHg.    Severe functional mitral regurgitation.     Echocardiogram 09/14/22:   1. Left ventricular ejection fraction, by estimation, is 50 to 55%. The  left ventricle has low normal function. The left ventricle has no regional  wall motion abnormalities. Left ventricular diastolic parameters are  indeterminate. There is the  interventricular septum is flattened in systole and diastole, consistent  with right ventricular pressure and volume overload.   2. Right ventricular systolic function is moderately reduced. The right  ventricular size is moderately to  severely. There is severely elevated  pulmonary artery systolic pressure. The estimated right ventricular  systolic pressure is 123456 mmHg.   3. Left atrial size was severely dilated.   4. Right atrial size was severely dilated.   5. The mitral valve is abnormal. There is severe mitral regurgitation  likely functional with restricted posterior mitral leaflet. EROA 0.51cm2,  RVol 69m.   6. Tricuspid valve regurgitation is moderate.   7. The aortic valve is tricuspid. There is severe calcifcation of the  aortic valve. There is severe thickening of the aortic valve. Aortic valve  regurgitation is not visualized. Moderate aortic valve stenosis. Aortic  valve area, by VTI measures 1.20  cm. Aortic valve mean gradient measures 21.0 mmHg. Aortic valve Vmax  measures 2.94 m/s.   8. Aortic dilatation noted. There is borderline dilatation of the  ascending aorta, measuring 36 mm.   9. The inferior vena cava is dilated in size with <50% respiratory  variability, suggesting right atrial pressure of 15 mmHg.   Comparison(s): Compared to prior TTE 03/2022, there is no significant  change. There continues to be severe MR, moderate AS, and severe pulmonary  HTN. Prior TTE with mean AoV gradient 197mg (currently 2175m). Prior  PASP 85 (currently 76m76m. RV remains   enlarged with moderate systolic dysfunction.   Laboratory Data:  Chemistry Recent Labs  Lab 09/30/22 0603 10/01/22 0506 10/02/22 0420  NA 131* 131* 132*  K 2.7* 3.3* 3.3*  CL 89* 90* 91*  CO2 '30 30 29  '$ GLUCOSE 98 101* 128*  BUN 38* 41* 47*  CREATININE 1.60* 1.63* 1.69*  CALCIUM 9.3 9.5 9.3  GFRNONAA 44* 43* 41*  ANIONGAP '12 11 12    '$ No results for input(s): "PROT", "ALBUMIN", "AST", "ALT", "ALKPHOS", "BILITOT" in the last 168 hours. Hematology Recent Labs  Lab 09/28/22 0500 10/02/22 [id number]  WBC 6.0 6.8  RBC 2.89* 3.01*  HGB 8.4* 8.9*  HCT 26.3* 27.7*  MCV 91.0 92.0  MCH 29.1 29.6  MCHC 31.9 32.1  RDW 17.6* 17.2*   PLT 289 355   Cardiac EnzymesNo results for input(s): "TROPONINI" in the last 168 hours. No results for input(s): "TROPIPOC" in the last 168 hours.  BNPNo results for input(s): "BNP", "PROBNP" in the last 168 hours.  DDimer No results for input(s): "DDIMER" in the last 168 hours.  Radiology/Studies:  No results found.   STS Risk Calculator:  Isolated MVR:  Procedure Type: Isolated MVR PERIOPERATIVE OUTCOME ESTIMATE % Operative Mortality 12% Morbidity & Mortality 45.1% Stroke 1.35% Renal Failure 17% Reoperation 5.21% Prolonged Ventilation 30.3% Deep Sternal Wound Infection 0.003% [place] Hospital Stay (>14 days) 36.4% Short Hospital Stay (<6 days)* 4.3%   Isolated MV Repair  Procedure Type: Isolated MVr PERIOPERATIVE OUTCOME ESTIMATE % Operative Mortality 10.3% Morbidity & Mortality 34.5% Stroke 1.1% Renal Failure 15.5% Reoperation 4.18% Prolonged Ventilation 17.6% Deep Sternal Wound Infection 0.829% [place] Hospital Stay (>14 days) 21.7% Short Hospital Stay (<6 days)* 6.7%  [facility] Cardiomyopathy Questionnaire     10/02/2022    4:10 PM  KCCQ-12  1 a. Ability to shower/bathe Moderately limited  1 b. Ability to walk 1 block Moderately limited  1 c. Ability to hurry/jog Other, Did not do  2. Edema feet/ankles/legs Every morning  3. Limited by fatigue Several times a day  4. Limited by dyspnea Several times a day  5. Sitting up / on 3+ pillows 3+ times a week, not every day  6. Limited enjoyment of life Limited quite a bit  7. Rest of life w/ symptoms Mostly dissatisfied  8 a. Participation in hobbies Limited quite a bit  8 b. Participation in chores Limited quite a bit  8 c. Visiting family/friends Moderately limited    Assessment and Plan:   DEAKYN STOLZENBURG is a 77 y.o. male with symptoms of severe, stage D mitral regurgitation with NYHA Class IV symptoms. I have reviewed the patient's recent transesophageal echocardiogram which is notable for normal left  ventricular size with EF 55%, mildly D-shaped interventricular septum suggestive of RV pressure/volume overload, moderate RV dilation with moderately decreased RV systolic function. Mild TR, severe left atrial enlargement, no LA appendage thrombus, no PFO/ASD by bubble study, tricuspid aortic valve with severe calcification, moderate AS by planimetry/VTI and severe mitral regurgitation with inadequate coaptation and restriction of posterior leaflet, suspect atrial functional MR. ERO 0.54 cm^2 by PISA, vena contracta area 0.52 cm^2. There was slight flow reversal in all 4 pulmonary veins. Mean gradient 3 mmHg.   I have reviewed the natural history of mitral regurgitation with the patient. We have discussed the limitations of medical therapy and the poor prognosis associated with symptomatic mitral regurgitation. We have also reviewed potential treatment options, including palliative medical therapy, conventional surgical mitral valve repair or replacement, and percutaneous mitral valve repair with MitraClip. We discussed treatment options in the context of this patient's specific comorbid medical conditions.    The patient's predicted risk of mortality with conventional mitral valve replacement/repair is 12 % / 10.3 % respectively,  primarily based on acute HF requiring inotrope support, obesity, CKD stage III, chronic lung disease with OSA, and CAD with hx of STEMI. Other significant comorbid condition include chronic lower extremity cellulitis with wounds.   Tentative plan will be to perform a [facility] and discharge home when medically ready and come back for elective TEER.  Signed, Kathyrn Drown, NP  10/02/2022 2:37 PM

## 2022-10-02 NOTE — Progress Notes (Signed)
Physical Therapy Treatment Patient Details Name: Ryan Garner MRN: [id number] DOB: Oct 23, 1945 Today's Date: 10/02/2022   History of Present Illness Pt is a 77 y.o. M who presents 09/12/2022 with A/C HFpEF. 2/26 acute abdominal pain>>CT A/P shows large left rectus sheath hematoma.Significant PMH: CAD, STEMI 2021, DES to LCx, permanent A fib, severe MR, HTN, HLD, sleep apnea, DM, rotator cuff repair, MR, chronic lymphedema, and HFpEF.    PT Comments    Patient received sitting up on side of bed. He is agreeable to PT session. Flat affect. Patient required min/mod assist to stand from low bed. Increased effort required. Patient is able to ambulate initially with RW, but then switched to rollator as patient has L UE weakness and likes to lean that forearm on walker. He required seated rest halfway through walk. Patient limited by weakness and fatigue. He will continue to benefit from skilled PT to improve activity tolerance and strength.        Recommendations for follow up therapy are one component of a multi-disciplinary discharge planning process, led by the attending physician.  Recommendations may be updated based on patient status, additional functional criteria and insurance authorization.  Follow Up Recommendations  Home health PT Can patient physically be transported by private vehicle: No (possibly depending on type of car)   Assistance Recommended at Discharge Frequent or constant Supervision/Assistance  Patient can return home with the following Assistance with cooking/housework;Help with stairs or ramp for entrance;Assist for transportation;A little help with walking and/or transfers;A little help with bathing/dressing/bathroom   Equipment Recommendations  None recommended by PT    Recommendations for Other Services       Precautions / Restrictions Precautions Precautions: Fall Restrictions Weight Bearing Restrictions: No     Mobility  Bed Mobility                General bed mobility comments: received sitting up on side of bed    Transfers Overall transfer level: Needs assistance Equipment used: Rolling walker (2 wheels) Transfers: Sit to/from Stand Sit to Stand: Min guard           General transfer comment: Pt initially was supervision for sit to stand but once fatigued pt required min guard.    Ambulation/Gait Ambulation/Gait assistance: Min guard Gait Distance (Feet): 300 Feet Assistive device: Rolling walker (2 wheels), Rollator (4 wheels) Gait Pattern/deviations: Step-through pattern, Decreased stride length Gait velocity: decreased     General Gait Details: No LOB noted. Patient ambulated initially with RW and then told me he does better with rollator so I switched device. Pt needed verbal cues for safety and using brakes on rollator.   Stairs             Wheelchair Mobility    Modified Rankin (Stroke Patients Only)       Balance Overall balance assessment: Mild deficits observed, not formally tested Sitting-balance support: Feet supported Sitting balance-Leahy Scale: Normal     Standing balance support: Bilateral upper extremity supported, During functional activity, Reliant on assistive device for balance Standing balance-Leahy Scale: Fair                              Cognition Arousal/Alertness: Awake/alert Behavior During Therapy: Flat affect Overall Cognitive Status: Within Functional Limits for tasks assessed  General Comments: Pt continues to minimally conversive and speaks in whisper.        Exercises      General Comments        Pertinent Vitals/Pain Pain Assessment Pain Assessment: Faces Faces Pain Scale: Hurts a little bit Pain Location: L arm Pain Descriptors / Indicators: Cramping, Constant, Grimacing Pain Intervention(s): Monitored during session    Home Living                          Prior Function             PT Goals (current goals can now be found in the care plan section) Acute Rehab PT Goals Patient Stated Goal: to get stronger PT Goal Formulation: With patient Time For Goal Achievement: 10/16/22 Potential to Achieve Goals: Good Progress towards PT goals: Progressing toward goals    Frequency    Min 3X/week      PT Plan Discharge plan needs to be updated    Co-evaluation              AM-PAC PT "6 Clicks" Mobility   Outcome Measure  Help needed turning from your back to your side while in a flat bed without using bedrails?: A Lot Help needed moving from lying on your back to sitting on the side of a flat bed without using bedrails?: A Lot Help needed moving to and from a bed to a chair (including a wheelchair)?: A Little Help needed standing up from a chair using your arms (e.g., wheelchair or bedside chair)?: A Little Help needed to walk in hospital room?: A Little Help needed climbing 3-5 steps with a railing? : A Lot 6 Click Score: 15    End of Session Equipment Utilized During Treatment: Gait belt Activity Tolerance: Patient limited by fatigue Patient left: in chair;with call bell/phone within reach;with family/visitor present Nurse Communication: Mobility status PT Visit Diagnosis: Muscle weakness (generalized) (M62.81);Difficulty in walking, not elsewhere classified (R26.2)     Time: [id number] PT Time Calculation (min) (ACUTE ONLY): 31 min  Charges:  $Gait Training: 23-37 mins                     Ruqayyah Lute, PT, GCS 10/02/22,9:30 AM

## 2022-10-02 NOTE — Progress Notes (Signed)
Mobility Specialist - Progress Note   10/02/22 1600  Mobility  Activity Ambulated with assistance in hallway  Level of Assistance Contact guard assist, steadying assist  Assistive Device Four wheel walker  Distance Ambulated (ft) 600 ft  Activity Response Tolerated well  Mobility Referral Yes  $Mobility charge 1 Mobility    Pt received in recliner and agreeable. No complaints throughout. Took prolonged seated rest break in hallway. Left in recliner w/ call bell at his side and all needs met.   [place] Specialist Please contact via SecureChat or Rehab office at [phone]

## 2022-10-02 NOTE — Anesthesia Preprocedure Evaluation (Signed)
Anesthesia Evaluation  Patient identified by MRN, date of birth, ID band Patient awake    Reviewed: Allergy & Precautions, NPO status , Patient's Chart, lab work & pertinent test results  Airway Mallampati: II  TM Distance: >3 FB Neck ROM: Full    Dental  (+) Dental Advisory Given, Poor Dentition   Pulmonary sleep apnea and Continuous Positive Airway Pressure Ventilation , COPD, former smoker   Pulmonary exam normal breath sounds clear to auscultation       Cardiovascular hypertension, pulmonary hypertension+ CAD, + Past MI, + Cardiac Stents and +CHF  + dysrhythmias Atrial Fibrillation + Valvular Problems/Murmurs MR and AS  Rhythm:Irregular Rate:Abnormal + Systolic murmurs Echo 99991111: 1. Left ventricular ejection fraction, by estimation, is 50 to 55%. The  left ventricle has low normal function. The left ventricle has no regional  wall motion abnormalities. Left ventricular diastolic parameters are  indeterminate. There is the  interventricular septum is flattened in systole and diastole, consistent  with right ventricular pressure and volume overload.   2. Right ventricular systolic function is moderately reduced. The right  ventricular size is moderately to severely. There is severely elevated  pulmonary artery systolic pressure. The estimated right ventricular  systolic pressure is 123456 mmHg.   3. Left atrial size was severely dilated.   4. Right atrial size was severely dilated.   5. The mitral valve is abnormal. There is severe mitral regurgitation  likely functional with restricted posterior mitral leaflet. EROA 0.51cm2,  RVol 74m.   6. Tricuspid valve regurgitation is moderate.   7. The aortic valve is tricuspid. There is severe calcifcation of the  aortic valve. There is severe thickening of the aortic valve. Aortic valve  regurgitation is not visualized. Moderate aortic valve stenosis. Aortic  valve area, by VTI  measures 1.20  cm. Aortic valve mean gradient measures 21.0 mmHg. Aortic valve Vmax  measures 2.94 m/s.   8. Aortic dilatation noted. There is borderline dilatation of the  ascending aorta, measuring 36 mm.   9. The inferior vena cava is dilated in size with <50% respiratory  variability, suggesting right atrial pressure of 15 mmHg.     Neuro/Psych  Neuromuscular disease    GI/Hepatic negative GI ROS, Neg liver ROS,,,  Endo/Other  diabetes  Obesity   Renal/GU Renal disease (AKI)     Musculoskeletal  (+) Arthritis ,    Abdominal   Peds  Hematology  (+) Blood dyscrasia (Eliquis, Plavix), anemia   Anesthesia Other Findings Day of surgery medications reviewed with the patient.  Reproductive/Obstetrics                              Anesthesia Physical Anesthesia Plan  ASA: 4  Anesthesia Plan: MAC   Post-op Pain Management: Minimal or no pain anticipated   Induction: Intravenous  PONV Risk Score and Plan: 1 and TIVA and Treatment may vary due to age or medical condition  Airway Management Planned: Natural Airway and Simple Face Mask  Additional Equipment:   Intra-op Plan:   Post-operative Plan:   Informed Consent: I have reviewed the patients History and Physical, chart, labs and discussed the procedure including the risks, benefits and alternatives for the proposed anesthesia with the patient or authorized representative who has indicated his/her understanding and acceptance.   Patient has DNR.  Discussed DNR with patient and Suspend DNR.   Dental advisory given  Plan Discussed with: CRNA  Anesthesia Plan Comments:  Anesthesia Quick Evaluation

## 2022-10-02 NOTE — CV Procedure (Signed)
Procedure: TEE  Indication: Mitral regurgitation  Sedation: Per anesthesiology  Findings: Please see echo section for full report.  Normal left ventricular size with EF 55%.  Normal wall motion.   Mildly D-shaped interventricular septum suggestive of RV pressure/volume overload.  Moderate RV dilation with moderately decreased RV systolic function.  Mild TR, peak RV-RA gradient 24 mmHg.  Severe left atrial enlargement, no LA appendage thrombus.  Severe right atrial enlargement.  No PFO/ASD by bubble study.  Tricuspid aortic valve with severe calcification.  No aortic insufficiency, moderate AS by planimetry and VTI.  There was severe mitral regurgitation and no stenosis.  Inadequate coaptation with restriction of posterior leaflet, suspect atrial functional MR.  ERO 0.54 cm^2 by PISA, vena contracta area 0.52 cm^2.  There was slight flow reversal in all 4 pulmonary veins.  Mean gradient 3 mmHg.   Severe functional mitral regurgitation.   Loralie Champagne 10/02/2022 1:43 PM

## 2022-10-02 NOTE — H&P (View-Only) (Signed)
Patient ID: OLEH KEATH, male   DOB: 1945-11-15, 77 y.o.   MRN: [id number]     Advanced Heart Failure Rounding Note  PCP-Cardiologist: Dorris Carnes, MD   Subjective:   2/26 acute abdominal pain>>CT A/P shows large left rectus sheath hematoma. Heparin d/ced. Lasix gtt stopped.  2/27 acute HF w/ acute worsening of dyspnea>>120 mg IV Lasix + BiPAP 3/3 Lasix drip increased to '20mg'$  per hour and given metolazone.   CVP 9/10 this AM, now off DBA (3/10); Now on PO diuretics, -2.7L. Weight stable   Feels fine this morning, has been ambulating in the halls no complaints. Denies CP.    Objective:   Weight Range: 108.5 kg Body mass index is 36.37 kg/m.   Vital Signs:   Temp:  [96.4 F (35.8 C)-98.6 F (37 C)] 98.3 F (36.8 C) (03/12 0748) Pulse Rate:  [65-98] 91 (03/12 0748) Resp:  [16-20] 19 (03/12 0748) BP: (89-110)/(63-77) 105/73 (03/12 0748) SpO2:  [91 %-100 %] 98 % (03/12 0748) Last BM Date : 10/01/22  Weight change: Filed Weights   09/30/22 0006 10/01/22 0007 10/01/22 0818  Weight: 111.5 kg 108.5 kg 108.5 kg    Intake/Output:   Intake/Output Summary (Last 24 hours) at 10/02/2022 0919 Last data filed at 10/02/2022 0900 Gross per 24 hour  Intake 792.23 ml  Output 3150 ml  Net -2357.77 ml  CVP 9/10 Physical Exam  General:  chronically ill appearing.  No respiratory difficulty HEENT: normal Neck: supple. JVD ~11 cm. Carotids 2+ bilat; no bruits. No lymphadenopathy or thyromegaly appreciated. Cor: PMI nondisplaced. Regular rate & rhythm. No rubs, gallops. + murmur. Lungs: clear Abdomen: soft, nontender, nondistended. No hepatosplenomegaly. No bruits or masses. Good bowel sounds. Extremities: no cyanosis, clubbing, rash, +1 BLE edema, + UNNA boots Neuro: alert & oriented x 3, cranial nerves grossly intact. moves all 4 extremities w/o difficulty. Affect pleasant.   Telemetry   A fib low 100s (Personally reviewed)  Labs  CBC Recent Labs    10/02/22 0420  WBC 6.8  HGB  8.9*  HCT 27.7*  MCV 92.0  PLT [id number]   Basic Metabolic Panel Recent Labs    10/01/22 0506 10/02/22 0420  NA 131* 132*  K 3.3* 3.3*  CL 90* 91*  CO2 30 29  GLUCOSE 101* 128*  BUN 41* 47*  CREATININE 1.63* 1.69*  CALCIUM 9.5 9.3  MG 1.8 2.0   BNP: BNP (last 3 results) Recent Labs    11/20/21 1409 04/06/22 1253 09/12/22 2101  BNP 485.0* 682.5* 577.3*   ProBNP (last 3 results) Recent Labs    08/15/22 1049 09/04/22 1404 09/11/22 1530  PROBNP 5,315* 11,525* 8,444*   Imaging   No results found.  Medications:    Scheduled Medications:  apixaban  5 mg Oral BID   atorvastatin  80 mg Oral Daily   Chlorhexidine Gluconate Cloth  6 each Topical Daily   dapagliflozin propanediol  10 mg Oral Daily   midodrine  2.5 mg Oral TID WC   senna-docusate  1 tablet Oral Daily   sodium chloride flush  3 mL Intravenous Q12H   spironolactone  12.5 mg Oral Daily   torsemide  80 mg Oral BID   umeclidinium bromide  1 puff Inhalation Daily   Infusions:  sodium chloride     sodium chloride 10 mL/hr at 10/02/22 0552   PRN Medications: sodium chloride, acetaminophen, albuterol, guaiFENesin-dextromethorphan, magnesium hydroxide, morphine injection, ondansetron (ZOFRAN) IV, oxyCODONE, sodium chloride flush  Patient Profile  Mr Tubridy  is 77 year old with a history of CAD, STEMI 2021, DES to LCx, permanent A fib, severe MR, HTN, HLD, sleep apnea, MR, chronic lymphedema, and HFpEF.    Admitted with A/C HFpEF.  Assessment/Plan  1. Acute on chronic HFpEF: With prominent RV dysfunction.  Echo this admission shows EF 50-55%, D-shaped septum, moderate-severe RV enlargement with moderate RV dysfunction, PASP 69, severe biatrial enlargement, moderate AS with mean gradient 21 mmHg/AVA 1.2 cm^2, IVC dilated. Diuretics held 2/26 w/ ABLA from rectus sheath hematoma. Had worsening HF/dyspnea>>120 mg IV Lasix + BiPAP. Lasix infusion started 2/28. Now off dobutamine for RV support.   - BP too low for  GDMT. Continue midodrine for BP support  - DBA d/c'd 3/10 - Now on po torsemide '80mg'$  BID, was on '120mg'$  PO lasix BID prior. Required metolazone and diamox through the weekend, may need PRN at d/c. Can consider 2.5 metolazone daily as well.  - Plan for discharge to CIR   2. Mitral regurgitation: Severe MR by echo.  Mechanism uncertain, may be functional MR with severe LAE. Suspect this is etiology of pulmonary hypertension and RV failure.  - mTEER evaluation ongoing. Scheduled for TEE either today or tomorrow pending availability  3. AKI on CKD 3: Suspect cardiorenal in etiology. Creatinine stable today.  - D/C dobutamine.  - Repleting potassium; sCr 1.6.   4. Atrial fibrillation: Permanent, has been seen by EP in the past. Not likely to successfully cardiovert and stay in NSR.  - Rate controlled  - Eliquis restarted on 3/2 (held for rectus sheath hematoma from heparin)   5. CAD:   Had DES to mLCx in 10/21.   - No s/s angina - Continue atorvastatin + eliquis.    6. LE wounds: Followed as outpatient by Dr. Sharol Given.  ABIs normal this admission.  - Wound care - Improved   7. Left Rectus Sheath Hematoma: 15.0 x 7.4 x 14.7 cm hematoma on CT 2/26.  -Resolved   8. Anemia, acute blood loss - in setting of rectus sheath hematoma.  - hgb stable   9. Hypokalemia - 2.7>3.3 -Supp K    10. Dispo; - pending d/c to CIR, open to it  Earnie Larsson AGACNP-BC  10/02/2022 9:19 AM  Patient seen with NP, agree with the above note .  Weight down again today but CVP higher on my read at 15-16.  Co-ox 69% off dobutamine.   No complaints.   General: NAD Neck: JVP 14 cm, no thyromegaly or thyroid nodule.  Lungs: Clear to auscultation bilaterally with normal respiratory effort. CV: Nondisplaced PMI.  Heart regular S1/S2, no S3/S4, 3/6 HSM LLSB/apex.  1+ edema to knees.  Abdomen: Soft, nontender, no hepatosplenomegaly, no distention.  Skin: Intact without lesions or rashes.  Neurologic: Alert and  oriented x 3.  Psych: Normal affect. Extremities: No clubbing or cyanosis.  HEENT: Normal.   Acute diastolic CHF in setting of severe mitral regurgitation (also with moderate AS).  He has diuresed extensively (>60 lbs). Was on dobutamine, now off with co-ox 69%. CVP up to 15-16 today though weight down again.  Creatinine stable 1.69.  - Continue torsemide 80 mg bid but will give a dose of metolazone 2.5 with pm torsemide today.  - Increase spironolactone to 25 mg daily.  - Continue Farxiga 10 mg daily.  - Needs TEE to assess mitral regurgitation, ?mTEER candidate.  Plan for today.  Discussed risk/benefits with patient and he agrees to procedure.    He remains on Eliquis  for AF, resolved rectus sheath hematoma. Hgb 8.9 today.   Loralie Champagne 10/02/2022 11:40 AM

## 2022-10-02 NOTE — Telephone Encounter (Signed)
Patient Advocate Encounter  Patient was approved to receive Farxiga from [employer] Effective 10/02/22 to 07/23/23  Medication should be delivered to patient within 7-10 business days.

## 2022-10-02 NOTE — Interval H&P Note (Signed)
History and Physical Interval Note:  10/02/2022 1:07 PM  Ryan Garner  has presented today for surgery, with the diagnosis of MR.  The various methods of treatment have been discussed with the patient and family. After consideration of risks, benefits and other options for treatment, the patient has consented to  Procedure(s): TRANSESOPHAGEAL ECHOCARDIOGRAM (TEE) (N/A) as a surgical intervention.  The patient's history has been reviewed, patient examined, no change in status, stable for surgery.  I have reviewed the patient's chart and labs.  Questions were answered to the patient's satisfaction.     [name] [employer]

## 2022-10-02 NOTE — Anesthesia Procedure Notes (Signed)
Procedure Name: MAC Date/Time: 10/02/2022 12:59 PM  Performed by: Harden Mo, CRNAPre-anesthesia Checklist: Patient identified, Emergency Drugs available, Suction available and Patient being monitored Patient Re-evaluated:Patient Re-evaluated prior to induction Oxygen Delivery Method: Nasal cannula Preoxygenation: Pre-oxygenation with 100% oxygen Induction Type: IV induction Placement Confirmation: positive ETCO2 and breath sounds checked- equal and bilateral Dental Injury: Teeth and Oropharynx as per pre-operative assessment

## 2022-10-02 NOTE — Transfer of Care (Signed)
Immediate Anesthesia Transfer of Care Note  Patient: Ryan Garner  Procedure(s) Performed: TRANSESOPHAGEAL ECHOCARDIOGRAM (TEE) BUBBLE STUDY  Patient Location: Endoscopy Unit  Anesthesia Type:MAC  Level of Consciousness: awake and drowsy  Airway & Oxygen Therapy: Patient Spontanous Breathing  Post-op Assessment: Report given to RN, Post -op Vital signs reviewed and stable, and Patient moving all extremities X 4  Post vital signs: Reviewed and stable  Last Vitals:  Vitals Value Taken Time  BP 98/78   Temp    Pulse 74 10/02/22 1345  Resp 13 10/02/22 1345  SpO2 97 % 10/02/22 1345  Vitals shown include unvalidated device data.  Last Pain:  Vitals:   10/02/22 1201  TempSrc: Oral  PainSc: 0-No pain      Patients Stated Pain Goal: 0 ([id number])  Complications: No notable events documented.

## 2022-10-02 NOTE — Progress Notes (Signed)
  Echocardiogram Echocardiogram Transesophageal has been performed.  Eartha Inch 10/02/2022, 1:49 PM

## 2022-10-02 NOTE — Progress Notes (Signed)
Occupational Therapy Treatment Patient Details Name: Ryan Garner MRN: [id number] DOB: 1946-01-13 Today's Date: 10/02/2022   History of present illness Pt is a 77 y.o. M who presents 09/12/2022 with A/C HFpEF. 2/26 acute abdominal pain>>CT A/P shows large left rectus sheath hematoma.Significant PMH: CAD, STEMI 2021, DES to LCx, permanent A fib, severe MR, HTN, HLD, sleep apnea, DM, rotator cuff repair, MR, chronic lymphedema, and HFpEF.   OT comments  Pt continuing to progress towards Pt focused goals. Pt ambulated around the room with Min guard and RW, able to stand at sink to complete grooming routine with one UE supported on sink, Close Supervision level. OT session limited d/t patient being transported off unit. Discussed with CIR representative and Pt's family the potential to return home vs CIR. Pt family reports that there is a strong support system with the Pt's cousin living next door to provide 24/7 support. Pt's family member also has chair lifts if need be. Pt will continue to receive skilled acute OT to address above deficits and help transition to next level of care. OT recommending home health OT to assist with strengthening, activity tolerance, and maximize functional independence.    Recommendations for follow up therapy are one component of a multi-disciplinary discharge planning process, led by the attending physician.  Recommendations may be updated based on patient status, additional functional criteria and insurance authorization.    Follow Up Recommendations  Home health OT     Assistance Recommended at Discharge Frequent or constant Supervision/Assistance  Patient can return home with the following  A little help with walking and/or transfers;Assistance with cooking/housework;Help with stairs or ramp for entrance;Assist for transportation;A lot of help with bathing/dressing/bathroom   Equipment Recommendations  None recommended by OT Per Pt's family, he already has an  adjustable bed. Shower chair recommended if Pt does not have one already.    Recommendations for Other Services      Precautions / Restrictions Precautions Precautions: Fall Restrictions Weight Bearing Restrictions: No       Mobility Bed Mobility               General bed mobility comments: Pt received sitting in recliner    Transfers Overall transfer level: Needs assistance Equipment used: Rolling walker (2 wheels) Transfers: Bed to chair/wheelchair/BSC, Sit to/from Stand Sit to Stand: Min guard           General transfer comment: Pt completed transport chair t/f with SBA and RW     Balance Overall balance assessment: Needs assistance Sitting-balance support: Feet supported Sitting balance-Leahy Scale: Normal Sitting balance - Comments: In chair   Standing balance support: Bilateral upper extremity supported, During functional activity, Reliant on assistive device for balance Standing balance-Leahy Scale: Fair Standing balance comment: Able to stand sink side to brush teeth and apply lip balm, 1 UE supported on sink.                           ADL either performed or assessed with clinical judgement   ADL Overall ADL's : Needs assistance/impaired     Grooming: Wash/dry hands;Oral care;Standing;Supervision/safety Grooming Details (indicate cue type and reason): Pt stood at sink with one UE support, washed face and brushed teeth with close supervision                             Functional mobility during ADLs: Rolling walker (2 wheels);Min guard  Extremity/Trunk Assessment Upper Extremity Assessment Upper Extremity Assessment: Generalized weakness   Lower Extremity Assessment Lower Extremity Assessment: Defer to PT evaluation   Cervical / Trunk Assessment Cervical / Trunk Assessment: Kyphotic    Vision Baseline Vision/History: 0 No visual deficits Vision Assessment?: No apparent visual deficits   Perception  Perception Perception: Not tested   Praxis Praxis Praxis: Intact    Cognition Arousal/Alertness: Awake/alert Behavior During Therapy: Flat affect Overall Cognitive Status: Within Functional Limits for tasks assessed                                 General Comments: Pt continues to minimally conversive and speaks in whisper.        Exercises      Shoulder Instructions       General Comments VSS on RA    Pertinent Vitals/ Pain       Pain Assessment Pain Assessment:  (Pt did not report any pain during today's session)  Home Living                                          Prior Functioning/Environment              Frequency  Min 2X/week        Progress Toward Goals  OT Goals(current goals can now be found in the care plan section)  Progress towards OT goals: Progressing toward goals  Acute Rehab OT Goals Patient Stated Goal: to get better OT Goal Formulation: With patient Time For Goal Achievement: 09/29/22 Potential to Achieve Goals: Good  Plan Frequency remains appropriate    Co-evaluation                 AM-PAC OT "6 Clicks" Daily Activity     Outcome Measure   Help from another person eating meals?: None Help from another person taking care of personal grooming?: A Little Help from another person toileting, which includes using toliet, bedpan, or urinal?: A Lot Help from another person bathing (including washing, rinsing, drying)?: A Lot Help from another person to put on and taking off regular upper body clothing?: A Little Help from another person to put on and taking off regular lower body clothing?: A Lot 6 Click Score: 16    End of Session Equipment Utilized During Treatment: Rolling walker (2 wheels)  OT Visit Diagnosis: Unsteadiness on feet (R26.81);Other abnormalities of gait and mobility (R26.89);Muscle weakness (generalized) (M62.81)   Activity Tolerance Patient tolerated treatment well    Patient Left Other (comment) (Left with transport team)   Nurse Communication Mobility status        Time: 1137-1150 OT Time Calculation (min): 13 min  Charges: OT General Charges $OT Visit: 1 Visit OT Treatments $Self Care/Home Management : 8-22 mins  10/02/2022  AB, OTR/L  Acute Rehabilitation Services  Office: [phone]   Cori Razor 10/02/2022, 3:12 PM

## 2022-10-02 NOTE — Anesthesia Postprocedure Evaluation (Signed)
Anesthesia Post Note  Patient: Ryan Garner  Procedure(s) Performed: TRANSESOPHAGEAL ECHOCARDIOGRAM (TEE) BUBBLE STUDY     Patient location during evaluation: Endoscopy Anesthesia Type: MAC Level of consciousness: oriented, awake and alert and awake Pain management: pain level controlled Vital Signs Assessment: post-procedure vital signs reviewed and stable Respiratory status: spontaneous breathing, nonlabored ventilation, respiratory function stable and patient connected to nasal cannula oxygen Cardiovascular status: blood pressure returned to baseline and stable Postop Assessment: no headache, no backache and no apparent nausea or vomiting Anesthetic complications: no   No notable events documented.  Last Vitals:  Vitals:   10/02/22 1415 10/02/22 1417  BP: 97/75 95/75  Pulse: 79 75  Resp: 13 14  Temp:    SpO2: 98% 98%    Last Pain:  Vitals:   10/02/22 1417  TempSrc:   PainSc: 0-No pain                 Santa Lighter

## 2022-10-02 NOTE — Progress Notes (Addendum)
Patient ID: Ryan Garner, male   DOB: Mar 28, 1946, 77 y.o.   MRN: [id number]     Advanced Heart Failure Rounding Note  PCP-Cardiologist: Dorris Carnes, MD   Subjective:   2/26 acute abdominal pain>>CT A/P shows large left rectus sheath hematoma. Heparin d/ced. Lasix gtt stopped.  2/27 acute HF w/ acute worsening of dyspnea>>120 mg IV Lasix + BiPAP 3/3 Lasix drip increased to '20mg'$  per hour and given metolazone.   CVP 9/10 this AM, now off DBA (3/10); Now on PO diuretics, -2.7L. Weight stable   Feels fine this morning, has been ambulating in the halls no complaints. Denies CP.    Objective:   Weight Range: 108.5 kg Body mass index is 36.37 kg/m.   Vital Signs:   Temp:  [96.4 F (35.8 C)-98.6 F (37 C)] 98.3 F (36.8 C) (03/12 0748) Pulse Rate:  [65-98] 91 (03/12 0748) Resp:  [16-20] 19 (03/12 0748) BP: (89-110)/(63-77) 105/73 (03/12 0748) SpO2:  [91 %-100 %] 98 % (03/12 0748) Last BM Date : 10/01/22  Weight change: Filed Weights   09/30/22 0006 10/01/22 0007 10/01/22 0818  Weight: 111.5 kg 108.5 kg 108.5 kg    Intake/Output:   Intake/Output Summary (Last 24 hours) at 10/02/2022 0919 Last data filed at 10/02/2022 0900 Gross per 24 hour  Intake 792.23 ml  Output 3150 ml  Net -2357.77 ml  CVP 9/10 Physical Exam  General:  chronically ill appearing.  No respiratory difficulty HEENT: normal Neck: supple. JVD ~11 cm. Carotids 2+ bilat; no bruits. No lymphadenopathy or thyromegaly appreciated. Cor: PMI nondisplaced. Regular rate & rhythm. No rubs, gallops. + murmur. Lungs: clear Abdomen: soft, nontender, nondistended. No hepatosplenomegaly. No bruits or masses. Good bowel sounds. Extremities: no cyanosis, clubbing, rash, +1 BLE edema, + UNNA boots Neuro: alert & oriented x 3, cranial nerves grossly intact. moves all 4 extremities w/o difficulty. Affect pleasant.   Telemetry   A fib low 100s (Personally reviewed)  Labs  CBC Recent Labs    10/02/22 0420  WBC 6.8  HGB  8.9*  HCT 27.7*  MCV 92.0  PLT [id number]   Basic Metabolic Panel Recent Labs    10/01/22 0506 10/02/22 0420  NA 131* 132*  K 3.3* 3.3*  CL 90* 91*  CO2 30 29  GLUCOSE 101* 128*  BUN 41* 47*  CREATININE 1.63* 1.69*  CALCIUM 9.5 9.3  MG 1.8 2.0   BNP: BNP (last 3 results) Recent Labs    11/20/21 1409 04/06/22 1253 09/12/22 2101  BNP 485.0* 682.5* 577.3*   ProBNP (last 3 results) Recent Labs    08/15/22 1049 09/04/22 1404 09/11/22 1530  PROBNP 5,315* 11,525* 8,444*   Imaging   No results found.  Medications:    Scheduled Medications:  apixaban  5 mg Oral BID   atorvastatin  80 mg Oral Daily   Chlorhexidine Gluconate Cloth  6 each Topical Daily   dapagliflozin propanediol  10 mg Oral Daily   midodrine  2.5 mg Oral TID WC   senna-docusate  1 tablet Oral Daily   sodium chloride flush  3 mL Intravenous Q12H   spironolactone  12.5 mg Oral Daily   torsemide  80 mg Oral BID   umeclidinium bromide  1 puff Inhalation Daily   Infusions:  sodium chloride     sodium chloride 10 mL/hr at 10/02/22 0552   PRN Medications: sodium chloride, acetaminophen, albuterol, guaiFENesin-dextromethorphan, magnesium hydroxide, morphine injection, ondansetron (ZOFRAN) IV, oxyCODONE, sodium chloride flush  Patient Profile  Ryan Garner  is 77 year old with a history of CAD, STEMI 2021, DES to LCx, permanent A fib, severe Ryan, HTN, HLD, sleep apnea, Ryan, chronic lymphedema, and HFpEF.    Admitted with A/C HFpEF.  Assessment/Plan  1. Acute on chronic HFpEF: With prominent RV dysfunction.  Echo this admission shows EF 50-55%, D-shaped septum, moderate-severe RV enlargement with moderate RV dysfunction, PASP 69, severe biatrial enlargement, moderate AS with mean gradient 21 mmHg/AVA 1.2 cm^2, IVC dilated. Diuretics held 2/26 w/ ABLA from rectus sheath hematoma. Had worsening HF/dyspnea>>120 mg IV Lasix + BiPAP. Lasix infusion started 2/28. Now off dobutamine for RV support.   - BP too low for  GDMT. Continue midodrine for BP support  - DBA d/c'd 3/10 - Now on po torsemide '80mg'$  BID, was on '120mg'$  PO lasix BID prior. Required metolazone and diamox through the weekend, may need PRN at d/c. Can consider 2.5 metolazone daily as well.  - Plan for discharge to CIR   2. Mitral regurgitation: Severe Ryan by echo.  Mechanism uncertain, may be functional Ryan with severe LAE. Suspect this is etiology of pulmonary hypertension and RV failure.  - mTEER evaluation ongoing. Scheduled for TEE either today or tomorrow pending availability  3. AKI on CKD 3: Suspect cardiorenal in etiology. Creatinine stable today.  - D/C dobutamine.  - Repleting potassium; sCr 1.6.   4. Atrial fibrillation: Permanent, has been seen by EP in the past. Not likely to successfully cardiovert and stay in NSR.  - Rate controlled  - Eliquis restarted on 3/2 (held for rectus sheath hematoma from heparin)   5. CAD:   Had DES to mLCx in 10/21.   - No s/s angina - Continue atorvastatin + eliquis.    6. LE wounds: Followed as outpatient by Dr. Sharol Given.  ABIs normal this admission.  - Wound care - Improved   7. Left Rectus Sheath Hematoma: 15.0 x 7.4 x 14.7 cm hematoma on CT 2/26.  -Resolved   8. Anemia, acute blood loss - in setting of rectus sheath hematoma.  - hgb stable   9. Hypokalemia - 2.7>3.3 -Supp K    10. Dispo; - pending d/c to CIR, open to it  Earnie Larsson AGACNP-BC  10/02/2022 9:19 AM  Patient seen with NP, agree with the above note .  Weight down again today but CVP higher on my read at 15-16.  Co-ox 69% off dobutamine.   No complaints.   General: NAD Neck: JVP 14 cm, no thyromegaly or thyroid nodule.  Lungs: Clear to auscultation bilaterally with normal respiratory effort. CV: Nondisplaced PMI.  Heart regular S1/S2, no S3/S4, 3/6 HSM LLSB/apex.  1+ edema to knees.  Abdomen: Soft, nontender, no hepatosplenomegaly, no distention.  Skin: Intact without lesions or rashes.  Neurologic: Alert and  oriented x 3.  Psych: Normal affect. Extremities: No clubbing or cyanosis.  HEENT: Normal.   Acute diastolic CHF in setting of severe mitral regurgitation (also with moderate AS).  He has diuresed extensively (>60 lbs). Was on dobutamine, now off with co-ox 69%. CVP up to 15-16 today though weight down again.  Creatinine stable 1.69.  - Continue torsemide 80 mg bid but will give a dose of metolazone 2.5 with pm torsemide today.  - Increase spironolactone to 25 mg daily.  - Continue Farxiga 10 mg daily.  - Needs TEE to assess mitral regurgitation, ?mTEER candidate.  Plan for today.  Discussed risk/benefits with patient and he agrees to procedure.    He remains on Eliquis  for AF, resolved rectus sheath hematoma. Hgb 8.9 today.   Loralie Champagne 10/02/2022 11:40 AM

## 2022-10-02 NOTE — Telephone Encounter (Signed)
For patient WK: This is Secondary MR and does not require a surgical consult  This valve looks suitable for a MitraClip implant. In the SAXB view, the fossa looks reasonable for a transseptal puncture. TR is noted. The LA dimensions are large enough for steering and straddle of the Clip Delivery System. The MR jet is broad based and centrally located (slightly medial of center). The posterior leaflet measures about 15 mm in the 124 LVOT grasping view. Gradient measures 3 mmHg (98 HR). MVA measures 7.13 cm2. Based on this information, I'd recommend an XTW and assess for gradient.

## 2022-10-03 ENCOUNTER — Encounter (HOSPITAL_COMMUNITY): Payer: Self-pay | Admitting: Cardiology

## 2022-10-03 ENCOUNTER — Encounter (HOSPITAL_COMMUNITY)
Admission: RE | Disposition: A | Discharge status: Home / Self Care | Payer: Self-pay | Source: Ambulatory Visit | Attending: Internal Medicine

## 2022-10-03 DIAGNOSIS — I340 Nonrheumatic mitral (valve) insufficiency: Secondary | ICD-10-CM | POA: Diagnosis not present

## 2022-10-03 DIAGNOSIS — Z1152 Encounter for screening for COVID-19: Secondary | ICD-10-CM | POA: Diagnosis not present

## 2022-10-03 DIAGNOSIS — I080 Rheumatic disorders of both mitral and aortic valves: Secondary | ICD-10-CM | POA: Diagnosis not present

## 2022-10-03 DIAGNOSIS — I130 Hypertensive heart and chronic kidney disease with heart failure and stage 1 through stage 4 chronic kidney disease, or unspecified chronic kidney disease: Secondary | ICD-10-CM | POA: Diagnosis not present

## 2022-10-03 DIAGNOSIS — Z66 Do not resuscitate: Secondary | ICD-10-CM | POA: Diagnosis not present

## 2022-10-03 DIAGNOSIS — I5033 Acute on chronic diastolic (congestive) heart failure: Secondary | ICD-10-CM | POA: Diagnosis not present

## 2022-10-03 DIAGNOSIS — Z006 Encounter for examination for normal comparison and control in clinical research program: Secondary | ICD-10-CM | POA: Diagnosis not present

## 2022-10-03 HISTORY — PX: RIGHT HEART CATH: CATH118263

## 2022-10-03 LAB — POCT I-STAT EG7
Acid-Base Excess: 6 mmol/L — ABNORMAL HIGH (ref 0.0–2.0)
Acid-Base Excess: 6 mmol/L — ABNORMAL HIGH (ref 0.0–2.0)
Bicarbonate: 31 mmol/L — ABNORMAL HIGH (ref 20.0–28.0)
Bicarbonate: 31.5 mmol/L — ABNORMAL HIGH (ref 20.0–28.0)
Calcium, Ion: 1.28 mmol/L (ref 1.15–1.40)
Calcium, Ion: 1.29 mmol/L (ref 1.15–1.40)
HCT: 30 % — ABNORMAL LOW (ref 39.0–52.0)
HCT: 30 % — ABNORMAL LOW (ref 39.0–52.0)
Hemoglobin: 10.2 g/dL — ABNORMAL LOW (ref 13.0–17.0)
Hemoglobin: 10.2 g/dL — ABNORMAL LOW (ref 13.0–17.0)
O2 Saturation: 53 %
O2 Saturation: 53 %
Potassium: 3.3 mmol/L — ABNORMAL LOW (ref 3.5–5.1)
Potassium: 3.3 mmol/L — ABNORMAL LOW (ref 3.5–5.1)
Sodium: 134 mmol/L — ABNORMAL LOW (ref 135–145)
Sodium: 134 mmol/L — ABNORMAL LOW (ref 135–145)
TCO2: 32 mmol/L (ref 22–32)
TCO2: 33 mmol/L — ABNORMAL HIGH (ref 22–32)
pCO2, Ven: 46 mmHg (ref 44–60)
pCO2, Ven: 46.6 mmHg (ref 44–60)
pH, Ven: 7.436 — ABNORMAL HIGH (ref 7.25–7.43)
pH, Ven: 7.437 — ABNORMAL HIGH (ref 7.25–7.43)
pO2, Ven: 27 mmHg — CL (ref 32–45)
pO2, Ven: 28 mmHg — CL (ref 32–45)

## 2022-10-03 LAB — COOXEMETRY PANEL
Carboxyhemoglobin: 2.4 % — ABNORMAL HIGH (ref 0.5–1.5)
Methemoglobin: 1.7 % — ABNORMAL HIGH (ref 0.0–1.5)
O2 Saturation: 88.6 %
Total hemoglobin: 9.7 g/dL — ABNORMAL LOW (ref 12.0–16.0)

## 2022-10-03 LAB — MAGNESIUM: Magnesium: 1.8 mg/dL (ref 1.7–2.4)

## 2022-10-03 LAB — CBC
HCT: 27 % — ABNORMAL LOW (ref 39.0–52.0)
Hemoglobin: 9.2 g/dL — ABNORMAL LOW (ref 13.0–17.0)
MCH: 30.5 pg (ref 26.0–34.0)
MCHC: 34.1 g/dL (ref 30.0–36.0)
MCV: 89.4 fL (ref 80.0–100.0)
Platelets: 362 10*3/uL (ref 150–400)
RBC: 3.02 MIL/uL — ABNORMAL LOW (ref 4.22–5.81)
RDW: 17.3 % — ABNORMAL HIGH (ref 11.5–15.5)
WBC: 7 10*3/uL (ref 4.0–10.5)
nRBC: 0 % (ref 0.0–0.2)

## 2022-10-03 LAB — BASIC METABOLIC PANEL
Anion gap: 13 (ref 5–15)
BUN: 47 mg/dL — ABNORMAL HIGH (ref 8–23)
CO2: 29 mmol/L (ref 22–32)
Calcium: 9.5 mg/dL (ref 8.9–10.3)
Chloride: 91 mmol/L — ABNORMAL LOW (ref 98–111)
Creatinine, Ser: 1.54 mg/dL — ABNORMAL HIGH (ref 0.61–1.24)
GFR, Estimated: 46 mL/min — ABNORMAL LOW (ref >60)
Glucose, Bld: 109 mg/dL — ABNORMAL HIGH (ref 70–99)
Potassium: 3.5 mmol/L (ref 3.5–5.1)
Sodium: 133 mmol/L — ABNORMAL LOW (ref 135–145)

## 2022-10-03 LAB — SURGICAL PCR SCREEN
MRSA, PCR: NEGATIVE
Staphylococcus aureus: NEGATIVE

## 2022-10-03 IMAGING — PT NM PET TUM IMG INITIAL (PI) SKULL BASE T - THIGH
1 series · 1 of 1 positions shown · non-contrast
Comparison: 06/19/2021

CLINICAL DATA: Initial treatment strategy for right lung nodules.

EXAM:
NUCLEAR MEDICINE PET SKULL BASE TO THIGH
TECHNIQUE: 15.5 mCi F-18 FDG was injected intravenously. Full-ring PET imaging
was performed from the skull base to thigh after the radiotracer. CT
data was obtained and used for attenuation correction and anatomic
localization.
Fasting blood glucose: 136 mg/dl

[Series 1036: results mm oncology reading · 1.0mm · 1.00mm/px · 1 of 1 slices shown]
[im 1/1]
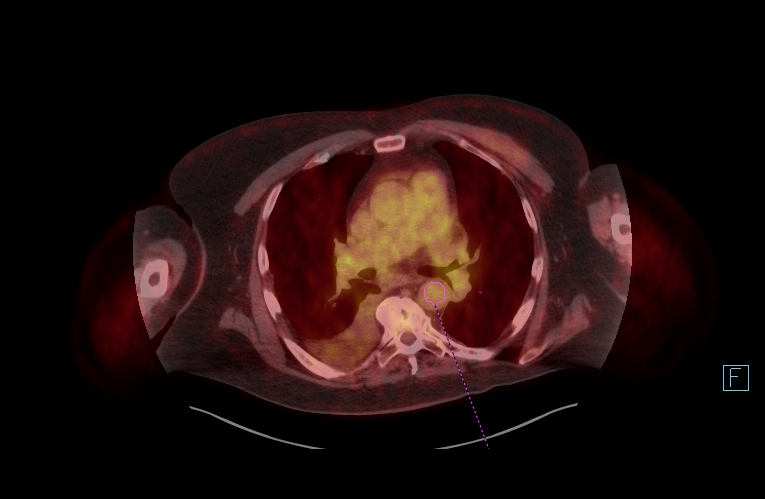

[1 of 1 positions shown; findings below may reference images not displayed]

FINDINGS: Mediastinal blood-pool activity (background): SUV max =

Liver activity (reference): SUV max = N/A

NECK:  No hypermetabolic lymph nodes or masses.

Incidental CT findings:  None.

CHEST: No hypermetabolic lymphadenopathy. 11 mm pulmonary nodule in
the anterior right lung apex and 9 mm pleural-based nodule in the
posterolateral right upper lobe are again seen but show no
hypermetabolic activity, suggesting benign etiology. Small right
pleural effusion is again seen, however no abnormal FDG activity
seen within the right pleural space.

Incidental CT findings: Aortic and coronary atherosclerotic
calcification noted.

ABDOMEN/PELVIS: No abnormal hypermetabolic activity within the
liver, pancreas, adrenal glands, or spleen. No hypermetabolic lymph
nodes in the abdomen or pelvis.

Incidental CT findings: Small paraumbilical ventral hernia and small
left inguinal hernia, both containing only fat.

SKELETON: No focal hypermetabolic bone lesions to suggest skeletal
metastasis.

Incidental CT findings:  None.
IMPRESSION: Right upper lobe pulmonary nodules show no hypermetabolic activity,
suggesting a benign etiology. Recommend continued follow-up by chest
CT without contrast in 6 months.

Stable small right pleural effusion.

## 2022-10-03 SURGERY — RIGHT HEART CATH
Anesthesia: LOCAL

## 2022-10-03 MED ORDER — POTASSIUM CHLORIDE CRYS ER 20 MEQ PO TBCR
40.0000 meq | EXTENDED_RELEASE_TABLET | ORAL | Status: AC
  Administered 2022-10-03 (×3): 40 meq via ORAL
  Filled 2022-10-03 (×3): qty 2

## 2022-10-03 MED ORDER — CEFAZOLIN SODIUM-DEXTROSE 2-4 GM/100ML-% IV SOLN: 2.0000 g | INTRAVENOUS | Status: AC

## 2022-10-03 MED ORDER — FUROSEMIDE 10 MG/ML IJ SOLN
80.0000 mg | Freq: Once | INTRAMUSCULAR | Status: AC
  Administered 2022-10-03: 80 mg via INTRAVENOUS
  Filled 2022-10-03: qty 8

## 2022-10-03 MED ORDER — LIDOCAINE HCL (PF) 1 % IJ SOLN
INTRAMUSCULAR | Status: DC | PRN
  Administered 2022-10-03: 2 mL

## 2022-10-03 MED ORDER — SODIUM CHLORIDE 0.9 % IV SOLN: INTRAVENOUS | Status: DC

## 2022-10-03 MED ORDER — FUROSEMIDE 10 MG/ML IJ SOLN
15.0000 mg/h | INTRAVENOUS | Status: DC
  Administered 2022-10-03 – 2022-10-05 (×3): 15 mg/h via INTRAVENOUS
  Filled 2022-10-03 (×4): qty 20

## 2022-10-03 MED ORDER — SODIUM CHLORIDE 0.9% FLUSH: 3.0000 mL | Freq: Two times a day (BID) | INTRAVENOUS | Status: DC

## 2022-10-03 MED ORDER — SODIUM CHLORIDE 0.9 % IV SOLN: 250.0000 mL | INTRAVENOUS | Status: DC | PRN

## 2022-10-03 MED ORDER — SODIUM CHLORIDE 0.9% FLUSH: 3.0000 mL | INTRAVENOUS | Status: DC | PRN

## 2022-10-03 MED ORDER — HEPARIN (PORCINE) IN NACL 1000-0.9 UT/500ML-% IV SOLN
INTRAVENOUS | Status: DC | PRN
  Administered 2022-10-03: 500 mL

## 2022-10-03 MED ORDER — METOLAZONE 2.5 MG PO TABS
2.5000 mg | ORAL_TABLET | Freq: Once | ORAL | Status: AC
  Administered 2022-10-03: 2.5 mg via ORAL
  Filled 2022-10-03: qty 1

## 2022-10-03 MED ORDER — CHLORHEXIDINE GLUCONATE CLOTH 2 % EX PADS
6.0000 | MEDICATED_PAD | Freq: Once | CUTANEOUS | Status: AC
  Administered 2022-10-04: 6 via TOPICAL

## 2022-10-03 MED ORDER — MAGNESIUM SULFATE 2 GM/50ML IV SOLN
2.0000 g | Freq: Once | INTRAVENOUS | Status: AC
  Administered 2022-10-03: 2 g via INTRAVENOUS
  Filled 2022-10-03: qty 50

## 2022-10-03 MED ORDER — CHLORHEXIDINE GLUCONATE CLOTH 2 % EX PADS
6.0000 | MEDICATED_PAD | Freq: Once | CUTANEOUS | Status: AC
  Administered 2022-10-03: 6 via TOPICAL

## 2022-10-03 MED ORDER — LIDOCAINE HCL (PF) 1 % IJ SOLN
INTRAMUSCULAR | Status: AC
  Filled 2022-10-03: qty 30

## 2022-10-03 MED ORDER — CHLORHEXIDINE GLUCONATE 0.12 % MT SOLN
15.0000 mL | Freq: Once | OROMUCOSAL | Status: DC
  Filled 2022-10-03: qty 15

## 2022-10-03 SURGICAL SUPPLY — 6 items
CATH BALLN WEDGE 5F 110CM (CATHETERS) IMPLANT
GUIDEWIRE .025 260CM (WIRE) IMPLANT
KIT HEART LEFT (KITS) ×1 IMPLANT
PACK CARDIAC CATHETERIZATION (CUSTOM PROCEDURE TRAY) ×1 IMPLANT
SHEATH GLIDE SLENDER 4/5FR (SHEATH) IMPLANT
TRANSDUCER W/STOPCOCK (MISCELLANEOUS) ×1 IMPLANT

## 2022-10-03 NOTE — Interval H&P Note (Signed)
History and Physical Interval Note:  10/03/2022 10:24 AM  Ryan Garner  has presented today for surgery, with the diagnosis of hf.  The various methods of treatment have been discussed with the patient and family. After consideration of risks, benefits and other options for treatment, the patient has consented to  Procedure(s): RIGHT HEART CATH (N/A) as a surgical intervention.  The patient's history has been reviewed, patient examined, no change in status, stable for surgery.  I have reviewed the patient's chart and labs.  Questions were answered to the patient's satisfaction.     Kory Rains [employer]

## 2022-10-03 NOTE — Consult Note (Signed)
   [facility] Inpatient Consult   10/03/2022  Ryan Garner 02-01-1946 [id number]  [facility] [ACO] Patient: Medicare ACO  *LLOS 21 days review  Patient was assessed for [place] [facility] for community services. Patient was discussed in unit progression and inpatient [facility] care management team states patient for mitral valve procedure.  Chart reviewed for home health recommendations noted.  Plan: Continue to follow post procedure and refer for post hospital care coordination, if applicable..   For additional questions or referrals please contact:   Natividad Brood, RN BSN [place]  [phone] business mobile phone Toll free office [phone]  *[place]  [phone] Fax number: [phone] [email or web address]

## 2022-10-03 NOTE — Progress Notes (Addendum)
Pt brought to [place], [place] [address], connected to monitor, Purewick connected to suction, denies pain, safety maintained, attempted PIV to [facility] x2 with no success, tolerated well, wnl

## 2022-10-03 NOTE — Progress Notes (Signed)
Patient ID: Ryan Garner, male   DOB: October 07, 1945, 77 y.o.   MRN: [id number]     Advanced Heart Failure Rounding Note  PCP-Cardiologist: Dorris Carnes, MD   Subjective:   2/26 acute abdominal pain>>CT A/P shows large left rectus sheath hematoma. Heparin d/ced. Lasix gtt stopped.  2/27 acute HF w/ acute worsening of dyspnea>>120 mg IV Lasix + BiPAP 3/3 Lasix drip increased to '20mg'$  per hour and given metolazone.   No complaints this morning.  Creatinine stable at 1.54.   RHC (3/13):  Hemodynamics (mmHg) RA mean 17 RV 78/17 PA 78/28, mean 46 PCWP mean 24. V waves were not prominent.  Oxygen saturations: PA 53% AO 94% Cardiac Output (Fick) 5.66  Cardiac Index (Fick) 2.59 PVR 3.9 WU PAPi 2.9   TEE (3/12): LV EF 55%, D-shaped septum, moderate RV dysfunction, severe functional Ryan (suspect atrial functional), mild TR, moderate aortic stenosis.    Objective:   Weight Range: Body mass index is 35.6 kg/m.   Vital Signs:   Temp:  [96.6 F (35.9 C)-98.7 F (37.1 C)] 98.6 F (37 C) (03/13 0422) Pulse Rate:  [0-106] 84 (03/13 1012) Resp:  [13-27] 27 (03/13 1011) BP: (90-115)/(65-77) 100/71 (03/13 0422) SpO2:  [93 %-100 %] 98 % (03/13 0910) Weight:  [106.2 kg] 106.2 kg (03/12 1201) Last BM Date : 10/01/22  Weight change: Filed Weights   10/01/22 0818 10/02/22 0714 10/02/22 1201  Weight: 108.5 kg 106.2 kg 106.2 kg    Intake/Output:   Intake/Output Summary (Last 24 hours) at 10/03/2022 1101 Last data filed at 10/03/2022 0729 Gross per 24 hour  Intake 680.62 ml  Output 2550 ml  Net -1869.38 ml   Physical Exam   General: NAD Neck: JVP 16, no thyromegaly or thyroid nodule.  Lungs: Clear to auscultation bilaterally with normal respiratory effort. CV: Nondisplaced PMI.  Heart regular S1/S2, no S3/S4, 3/6 HSM apex.  1+ edema to knees.   Abdomen: Soft, nontender, no hepatosplenomegaly, no distention.  Skin: Intact without lesions or rashes.  Neurologic: Alert and oriented x 3.   Psych: Normal affect. Extremities: No clubbing or cyanosis.  HEENT: Normal.   Telemetry    A fib 90s (Personally reviewed)   Labs  CBC Recent Labs    10/02/22 0420 10/03/22 0435  WBC 6.8 7.0  HGB 8.9* 9.2*  HCT 27.7* 27.0*  MCV 92.0 89.4  PLT 355 [id number]   Basic Metabolic Panel Recent Labs    10/02/22 0420 10/03/22 0435  NA 132* 133*  K 3.3* 3.5  CL 91* 91*  CO2 29 29  GLUCOSE 128* 109*  BUN 47* 47*  CREATININE 1.69* 1.54*  CALCIUM 9.3 9.5  MG 2.0 1.8   BNP: BNP (last 3 results) Recent Labs    11/20/21 1409 04/06/22 1253 09/12/22 2101  BNP 485.0* 682.5* 577.3*   ProBNP (last 3 results) Recent Labs    08/15/22 1049 09/04/22 1404 09/11/22 1530  PROBNP 5,315* 11,525* 8,444*   Imaging   CARDIAC CATHETERIZATION  Result Date: 10/03/2022 1. Right and left heart filling pressures remain elevated.  Surprisingly, V-waves in PCWP tracing not prominent but LA is very large. 2. 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 3. Preserved cardiac output. 4. RV function reasonable with PAPi 2.9. Continue aggressive diuresis. Timing of Mitraclip to be determined.   ECHO TEE  Result Date: 10/02/2022    TRANSESOPHOGEAL ECHO REPORT   Patient Name:   Ryan Garner Date of Exam: 10/02/2022 Medical Rec #:  [id number]      Height:       68.0 in Accession #:    [id number]     Weight:       234.1 lb Date of Birth:  1946/07/13       BSA:          2.185 m Patient Age:    40 years       BP:           94/69 mmHg Patient Gender: M              HR:           95 bpm. Exam Location:  Inpatient Procedure: 2D Echo, Transesophageal Echo, Cardiac Doppler, Color Doppler, Saline            Contrast Bubble Study and 3D Echo Indications:    Mitral regurgitation  History:        Patient has prior history of Echocardiogram examinations, most                 recent 09/14/2022. CHF, Previous Myocardial Infarction and CAD,                 Arrythmias:Atrial Fibrillation,  Signs/Symptoms:Dyspnea; Risk                 Factors:Sleep Apnea, Hypertension, Dyslipidemia and Diabetes.  Sonographer:    Eartha Inch Referring Phys: [id number] ALMA L DIAZ PROCEDURE: After discussion of the risks and benefits of a TEE, an informed consent was obtained from the patient. The transesophogeal probe was passed without difficulty through the esophogus of the patient. Imaged were obtained with the patient in a left lateral decubitus position. Sedation performed by different physician. The patient was monitored while under deep sedation. Anesthestetic sedation was provided intravenously by Anesthesiology: 349.'84mg'$  of Propofol. Image quality was good. The patient's vital signs; including heart rate, blood pressure, and oxygen saturation; remained stable throughout the procedure. Supplementary images were obtained from transthoracic windows as indicated to answer the clinical question. The patient developed no complications during the procedure.  IMPRESSIONS  1. Left ventricular ejection fraction, by estimation, is 55%. The left ventricle has normal function. The left ventricle has no regional wall motion abnormalities.  2. Mildly D-shaped septum suggestive of RV pressure/volume overload. . Right ventricular systolic function is moderately reduced. The right ventricular size is moderately enlarged.  3. Peak RV-RA gradient 24 mmHg.  4. Left atrial size was severely dilated. No left atrial/left atrial appendage thrombus was detected.  5. Right atrial size was severely dilated.  6. No PFO/ASD by bubble study.  7. There was severe mitral regurgitation and no stenosis. Inadequate coaptation with restriction of posterior leaflet, suspect atrial functional Ryan. ERO 0.54 cm^2 by PISA, vena contracta area 0.52 cm^2. There was slight flow reversal in all 4 pulmonary veins. Mean gradient 3 mmHg.  8. AVA 1.2 cm^2 by planimetry. The aortic valve is tricuspid. There is moderate calcification of the aortic valve. Aortic  valve regurgitation is not visualized. Moderate aortic valve stenosis. Aortic valve area, by VTI measures 1.35 cm. Aortic valve mean gradient measures 15.0 mmHg. FINDINGS  Left Ventricle: Left ventricular ejection fraction, by estimation, is 55%. The left ventricle has normal function. The left ventricle has no regional wall motion abnormalities. The left ventricular internal cavity size was normal in size. There is no left ventricular hypertrophy. Right Ventricle: Mildly D-shaped septum suggestive of RV pressure/volume overload. The right ventricular size is moderately enlarged. No increase in  right ventricular wall thickness. Right ventricular systolic function is moderately reduced. Left Atrium: Left atrial size was severely dilated. No left atrial/left atrial appendage thrombus was detected. Right Atrium: Right atrial size was severely dilated. Pericardium: There is no evidence of pericardial effusion. Mitral Valve: There was severe mitral regurgitation and no stenosis. Inadequate coaptation with restriction of posterior leaflet, suspect atrial functional Ryan. ERO 0.54 cm^2 by PISA, vena contracta area 0.52 cm^2. There was slight flow reversal in all 4 pulmonary veins. Mean gradient 3 mmHg. The mitral valve is abnormal. Severe mitral valve regurgitation. MV peak gradient, 5.4 mmHg. The mean mitral valve gradient is 3.0 mmHg. Tricuspid Valve: Peak RV-RA gradient 24 mmHg. The tricuspid valve is normal in structure. Tricuspid valve regurgitation is mild. Aortic Valve: AVA 1.2 cm^2 by planimetry. The aortic valve is tricuspid. There is moderate calcification of the aortic valve. Aortic valve regurgitation is not visualized. Moderate aortic stenosis is present. Aortic valve mean gradient measures 15.0 mmHg. Aortic valve peak gradient measures 23.9 mmHg. Aortic valve area, by VTI measures 1.35 cm. Pulmonic Valve: The pulmonic valve was normal in structure. Pulmonic valve regurgitation is not visualized. Aorta: The  aortic root is normal in size and structure. IAS/Shunts: No PFO/ASD by bubble study. Agitated saline contrast was given intravenously to evaluate for intracardiac shunting. Additional Comments: Spectral Doppler performed. LEFT VENTRICLE PLAX 2D LVOT diam:     2.30 cm LV SV:         69 LV SV Index:   31 LVOT Area:     4.15 cm  AORTIC VALVE AV Area (Vmax):    1.55 cm AV Area (Vmean):   1.53 cm AV Area (VTI):     1.35 cm AV Vmax:           244.33 cm/s AV Vmean:          171.000 cm/s AV VTI:            0.510 m AV Peak Grad:      23.9 mmHg AV Mean Grad:      15.0 mmHg LVOT Vmax:         91.40 cm/s LVOT Vmean:        63.000 cm/s LVOT VTI:          0.165 m LVOT/AV VTI ratio: 0.32 MITRAL VALVE                  TRICUSPID VALVE MV Area VTI:  3.48 cm        TR Peak grad:   24.4 mmHg MV Peak grad: 5.4 mmHg        TR Vmax:        247.00 cm/s MV Mean grad: 3.0 mmHg MV Vmax:      1.16 m/s        SHUNTS MV Vmean:     88.0 cm/s       Systemic VTI:  0.16 m Ryan Peak grad:    102.8 mmHg   Systemic Diam: 2.30 cm Ryan Mean grad:    69.0 mmHg Ryan Vmax:         507.00 cm/s Ryan Vmean:        397.0 cm/s Ryan PISA:         7.60 cm Ryan PISA Eff ROA: 54 mm Ryan PISA Radius:  1.10 cm Hanson Medeiros McleanMD Electronically signed by Franki Monte Signature Date/Time: 10/02/2022/5:21:10 PM    Final     Medications:    Scheduled Medications:  atorvastatin  80 mg  Oral Daily   [MAR Hold] Chlorhexidine Gluconate Cloth  6 each Topical Daily   furosemide  80 mg Intravenous Once   metolazone  2.5 mg Oral Once   [MAR Hold] midodrine  2.5 mg Oral TID WC   potassium chloride  40 mEq Oral Q2H   [MAR Hold] senna-docusate  1 tablet Oral Daily   sodium chloride flush  3 mL Intravenous Q12H   [MAR Hold] spironolactone  12.5 mg Oral Daily   umeclidinium bromide  1 puff Inhalation Daily   Infusions:  [MAR Hold] sodium chloride     sodium chloride     furosemide (LASIX) 200 mg in dextrose 5 % 100 mL (2 mg/mL) infusion     magnesium sulfate bolus IVPB      PRN Medications: [MAR Hold] sodium chloride, [MAR Hold] acetaminophen, albuterol, [MAR Hold] guaiFENesin-dextromethorphan, [MAR Hold] magnesium hydroxide, [MAR Hold]  morphine injection, [MAR Hold] ondansetron (ZOFRAN) IV, [MAR Hold] oxyCODONE, [MAR Hold] sodium chloride flush  Patient Profile  Ryan Garner is 77 year old with a history of CAD, STEMI 2021, DES to LCx, permanent A fib, severe Ryan, HTN, HLD, sleep apnea, Ryan, chronic lymphedema, and HFpEF.    Admitted with A/C HFpEF.  Assessment/Plan   1. Acute on chronic HFpEF: With prominent RV dysfunction.  Echo this admission shows EF 50-55%, D-shaped septum, moderate-severe RV enlargement with moderate RV dysfunction, PASP 69, severe biatrial enlargement, moderate AS with mean gradient 21 mmHg/AVA 1.2 cm^2, IVC dilated. Diuretics held 2/26 w/ ABLA from rectus sheath hematoma. Had worsening HF/dyspnea>>120 mg IV Lasix + BiPAP. Lasix infusion started 2/28.  Initially on dobutamine for RV support, now off.  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  CI 2.6, PAPi adequate at 2.9.  Creatinine lower 1.5.  - Will give Lasix 80 mg IV bolus then start gtt at 15 mg/hr.  Will give metolazone 2.5 x 1 and replace K.  - Continue spironolactone 12.5 daily.  - On midodrine 2.5 tid.  2. Mitral regurgitation: Severe Ryan by echo.  TEE on 3/12 showed LV EF 55%, D-shaped septum, moderate RV dysfunction, severe functional Ryan (suspect atrial functional, also significant posterior leaflet restriction), mild TR, moderate aortic stenosis.  - mTEER evaluation ongoing. Possibly tomorrow if good diuresis today, otherwise next week.  3. AKI on CKD 3: Suspect cardiorenal in etiology. Creatinine stable today at 1.5. .  4. Atrial fibrillation: Permanent, has been seen by EP in the past. Not likely to successfully cardiovert and stay in NSR.  Eliquis restarted on 3/2 (held prior for  rectus sheath hematoma from heparin).  Now on hold again for possible mTEER tomorrow.  - Restart Eliquis after mTEER or tomorrow if mTEER delayed to next week.  5. CAD:   Had DES to mLCx in 10/21.  No chest pain.  - Continue atorvastatin - No ASA with Eliquis use.  6. LE wounds: Followed as outpatient by Dr. Sharol Given.  ABIs normal this admission.  - Wound care - Improved 7. Left Rectus Sheath Hematoma: 15.0 x 7.4 x 14.7 cm hematoma on CT 2/26.  - Resolved 8. Anemia, acute blood loss: in setting of rectus sheath hematoma. Now stable.  - hgb stable 9. Aortic stenosis: Moderate on TEE.    Loralie Champagne 10/03/2022 11:01 AM

## 2022-10-03 NOTE — Progress Notes (Signed)
Patient on home cpap at this time.

## 2022-10-03 NOTE — Progress Notes (Signed)
Mobility Specialist - Progress Note   10/03/22 1600  Mobility  Activity Ambulated with assistance in hallway  Level of Assistance Contact guard assist, steadying assist  Assistive Device Four wheel walker  Distance Ambulated (ft) 600 ft  Activity Response Tolerated well  Mobility Referral Yes  $Mobility charge 1 Mobility    Pt received in recliner and agreeable. No complaints on walk, took prolonged seated rest break in hallway. Left in recliner w/ call bell in reach.   [place] Specialist Please contact via SecureChat or Rehab office at [phone]

## 2022-10-04 ENCOUNTER — Other Ambulatory Visit (HOSPITAL_COMMUNITY): Payer: Medicare Other

## 2022-10-04 DIAGNOSIS — I5033 Acute on chronic diastolic (congestive) heart failure: Secondary | ICD-10-CM | POA: Diagnosis not present

## 2022-10-04 LAB — COOXEMETRY PANEL
Carboxyhemoglobin: 2 % — ABNORMAL HIGH (ref 0.5–1.5)
Methemoglobin: <0.7 % (ref 0.0–1.5)
O2 Saturation: 62.8 %
Total hemoglobin: 9.8 g/dL — ABNORMAL LOW (ref 12.0–16.0)

## 2022-10-04 LAB — CBC
HCT: 28.3 % — ABNORMAL LOW (ref 39.0–52.0)
Hemoglobin: 9.5 g/dL — ABNORMAL LOW (ref 13.0–17.0)
MCH: 30.3 pg (ref 26.0–34.0)
MCHC: 33.6 g/dL (ref 30.0–36.0)
MCV: 90.1 fL (ref 80.0–100.0)
Platelets: 382 10*3/uL (ref 150–400)
RBC: 3.14 MIL/uL — ABNORMAL LOW (ref 4.22–5.81)
RDW: 17.3 % — ABNORMAL HIGH (ref 11.5–15.5)
WBC: 6.6 10*3/uL (ref 4.0–10.5)
nRBC: 0 % (ref 0.0–0.2)

## 2022-10-04 LAB — BASIC METABOLIC PANEL
Anion gap: 17 — ABNORMAL HIGH (ref 5–15)
BUN: 47 mg/dL — ABNORMAL HIGH (ref 8–23)
CO2: 29 mmol/L (ref 22–32)
Calcium: 9.9 mg/dL (ref 8.9–10.3)
Chloride: 87 mmol/L — ABNORMAL LOW (ref 98–111)
Creatinine, Ser: 1.49 mg/dL — ABNORMAL HIGH (ref 0.61–1.24)
GFR, Estimated: 48 mL/min — ABNORMAL LOW (ref >60)
Glucose, Bld: 147 mg/dL — ABNORMAL HIGH (ref 70–99)
Potassium: 3.4 mmol/L — ABNORMAL LOW (ref 3.5–5.1)
Sodium: 133 mmol/L — ABNORMAL LOW (ref 135–145)

## 2022-10-04 LAB — MAGNESIUM: Magnesium: 2.1 mg/dL (ref 1.7–2.4)

## 2022-10-04 MED ORDER — APIXABAN 5 MG PO TABS
5.0000 mg | ORAL_TABLET | Freq: Two times a day (BID) | ORAL | Status: AC
  Administered 2022-10-04 – 2022-10-07 (×8): 5 mg via ORAL
  Filled 2022-10-04 (×8): qty 1

## 2022-10-04 MED ORDER — POTASSIUM CHLORIDE CRYS ER 20 MEQ PO TBCR: 40.0000 meq | EXTENDED_RELEASE_TABLET | Freq: Two times a day (BID) | ORAL | Status: DC

## 2022-10-04 MED ORDER — POTASSIUM CHLORIDE CRYS ER 20 MEQ PO TBCR
40.0000 meq | EXTENDED_RELEASE_TABLET | ORAL | Status: AC
  Administered 2022-10-04 (×3): 40 meq via ORAL
  Filled 2022-10-04 (×3): qty 2

## 2022-10-04 NOTE — Progress Notes (Signed)
Physical Therapy Treatment Patient Details Name: Ryan Garner MRN: [id number] DOB: 1946/04/03 Today's Date: 10/04/2022   History of Present Illness Pt is a 77 y.o. M who presents 09/12/2022 with A/C HFpEF. 2/26 acute abdominal pain>>CT A/P shows large left rectus sheath hematoma.Significant PMH: CAD, STEMI 2021, DES to LCx, permanent A fib, severe MR, HTN, HLD, sleep apnea, DM, rotator cuff repair, MR, chronic lymphedema, and HFpEF.    PT Comments    Pt tolerated treatment well today. Pt with similar presentation to previous sessions. No change in DC/DME recs at this time. PT will continue to follow.     Recommendations for follow up therapy are one component of a multi-disciplinary discharge planning process, led by the attending physician.  Recommendations may be updated based on patient status, additional functional criteria and insurance authorization.  Follow Up Recommendations  Home health PT Can patient physically be transported by private vehicle: No   Assistance Recommended at Discharge Frequent or constant Supervision/Assistance  Patient can return home with the following Assistance with cooking/housework;Help with stairs or ramp for entrance;Assist for transportation;A little help with walking and/or transfers;A little help with bathing/dressing/bathroom   Equipment Recommendations  None recommended by PT    Recommendations for Other Services       Precautions / Restrictions Precautions Precautions: Fall Restrictions Weight Bearing Restrictions: No     Mobility  Bed Mobility               General bed mobility comments: Pt received sitting in recliner    Transfers Overall transfer level: Needs assistance Equipment used: Rollator (4 wheels) Transfers: Sit to/from Stand Sit to Stand: Supervision                Ambulation/Gait Ambulation/Gait assistance: Supervision Gait Distance (Feet): 600 Feet (1 prolonged seated rest break) Assistive device:  Rollator (4 wheels) Gait Pattern/deviations: Step-through pattern, Decreased stride length Gait velocity: decreased     General Gait Details: no LOB noted. 1 prolonged standing rest break   Stairs             Wheelchair Mobility    Modified Rankin (Stroke Patients Only)       Balance Overall balance assessment: Needs assistance Sitting-balance support: Feet supported Sitting balance-Leahy Scale: Normal Sitting balance - Comments: In chair   Standing balance support: Bilateral upper extremity supported, During functional activity, Reliant on assistive device for balance Standing balance-Leahy Scale: Fair                              Cognition Arousal/Alertness: Awake/alert Behavior During Therapy: Flat affect Overall Cognitive Status: Within Functional Limits for tasks assessed                                 General Comments: Pt continues to minimally conversive and speaks in whisper.        Exercises      General Comments General comments (skin integrity, edema, etc.): VSS on RA. Pt in NAD      Pertinent Vitals/Pain Pain Assessment Pain Assessment: No/denies pain    Home Living                          Prior Function            PT Goals (current goals can now be found in the care plan  section) Progress towards PT goals: Progressing toward goals    Frequency    Min 3X/week      PT Plan Current plan remains appropriate    Co-evaluation              AM-PAC PT "6 Clicks" Mobility   Outcome Measure  Help needed turning from your back to your side while in a flat bed without using bedrails?: A Lot Help needed moving from lying on your back to sitting on the side of a flat bed without using bedrails?: A Lot Help needed moving to and from a bed to a chair (including a wheelchair)?: A Little Help needed standing up from a chair using your arms (e.g., wheelchair or bedside chair)?: A Little Help needed  to walk in hospital room?: A Little Help needed climbing 3-5 steps with a railing? : A Lot 6 Click Score: 15    End of Session Equipment Utilized During Treatment: Gait belt Activity Tolerance: Patient tolerated treatment well Patient left: with family/visitor present;with call bell/phone within reach (on [facility]) Nurse Communication: Mobility status;Other (comment) (Pt left on BSC) PT Visit Diagnosis: Muscle weakness (generalized) (M62.81);Difficulty in walking, not elsewhere classified (R26.2)     Time: [id number] PT Time Calculation (min) (ACUTE ONLY): 24 min  Charges:  $Gait Training: 23-37 mins                     Shelby Mattocks, PT, DPT Acute Rehab Services [id number]    Viann Shove 10/04/2022, 12:32 PM

## 2022-10-04 NOTE — Progress Notes (Signed)
IP rehab admissions - noted patient with good progress.  PT/OT now recommending home with [facility] therapies.  I will sign off for CIR now.  Patient will not need a CIR admission.  Call me for questions.  [phone]

## 2022-10-04 NOTE — TOC Progression Note (Signed)
Transition of Care [facility]) - Progression Note    Patient Details  Name: Ryan Garner MRN: [id number] Date of Birth: [birth year]  Transition of Care [facility]) CM/SW Contact  Erenest Rasher, RN Phone Number: [phone] 10/04/2022, 12:16 PM  Clinical Narrative:    Pt is active with [facility] and spoke to rep, Caryl Pina the can add all disciplines in the home [facility] RN, PT, OT, aide and SW. Will need HH orders with [facility]. PT/OT recommending HH. CIR signed off.    Expected Discharge Plan: [place] Barriers to Discharge: Continued Medical Work up  Expected Discharge Plan and Services   Discharge Planning Services: CM Consult Post Acute Care Choice: [place]: RN, PT [facility] Agency: [place] ([facility]) Date HH Agency Contacted: 09/21/22 Time [place]: [id number] Representative spoke with at [place]: [place] ([place]) Interventions SDOH Screenings   Food Insecurity: No Food Insecurity (07/25/2022)  Housing: Low Risk  (07/04/2022)  Transportation Needs: No Transportation Needs (07/25/2022)  Utilities: Not At Risk (07/04/2022)  Alcohol Screen: Low Risk  (07/04/2022)  Depression (PHQ2-9): Low Risk  (08/23/2022)  Financial Resource Strain: Low Risk  (07/04/2022)  Physical Activity: Insufficiently Active (07/04/2022)  Social Connections: Socially Integrated (07/04/2022)  Stress: No Stress Concern Present (07/04/2022)  Tobacco Use: Medium Risk (10/03/2022)    Readmission Risk Interventions     No data to display

## 2022-10-04 NOTE — Progress Notes (Signed)
Patient ID: MAUEL CORGAN, male   DOB: [birth year], 77 y.o.   MRN: [id number]     Advanced Heart Failure Rounding Note  PCP-Cardiologist: Dorris Carnes, MD   Subjective:   2/26 acute abdominal pain>>CT A/P shows large left rectus sheath hematoma. Heparin d/ced. Lasix gtt stopped.  2/27 acute HF w/ acute worsening of dyspnea>>120 mg IV Lasix + BiPAP 3/3 Lasix drip increased to '20mg'$  per hour and given metolazone.   No complaints this morning, weight down another 5 lbs.  He is back on Lasix gtt 15 mg/hr.  Co-ox 63%, CVP 11-12.  Creatinine stable at 1.54 => 1.49.   RHC (3/13):  Hemodynamics (mmHg) RA mean 17 RV 78/17 PA 78/28, mean 46 PCWP mean 24. V waves were not prominent.  Oxygen saturations: PA 53% AO 94% Cardiac Output (Fick) 5.66  Cardiac Index (Fick) 2.59 PVR 3.9 WU PAPi 2.9   TEE (3/12): LV EF 55%, D-shaped septum, moderate RV dysfunction, severe functional MR (suspect atrial functional), mild TR, moderate aortic stenosis.    Objective:   Weight Range: Body mass index is 34.76 kg/m.   Vital Signs:   Temp:  [98 F (36.7 C)-98.3 F (36.8 C)] 98 F (36.7 C) (03/14 0731) Pulse Rate:  [74-92] 85 (03/14 0731) Resp:  [18-27] 18 (03/14 0731) BP: (111-139)/(64-87) 119/64 (03/14 0731) SpO2:  [98 %-100 %] 98 % (03/14 0830) Weight:  [103.7 kg] 103.7 kg (03/14 0036) Last BM Date : 10/02/22  Weight change: Filed Weights   10/02/22 1201 10/03/22 0729 10/04/22 0036  Weight: 106.2 kg 105.7 kg 103.7 kg    Intake/Output:   Intake/Output Summary (Last 24 hours) at 10/04/2022 0904 Last data filed at 10/04/2022 0647 Gross per 24 hour  Intake 548.56 ml  Output 4450 ml  Net -3901.44 ml   Physical Exam   General: NAD Neck: JVP 12 cm, no thyromegaly or thyroid nodule.  Lungs: Clear to auscultation bilaterally with normal respiratory effort. CV: Nondisplaced PMI.  Heart regular S1/S2, no S3/S4, 3/6 HSM apex.  1+ edema to knees.  Abdomen: Soft, nontender, no hepatosplenomegaly,  no distention.  Skin: Intact without lesions or rashes.  Neurologic: Alert and oriented x 3.  Psych: Normal affect. Extremities: No clubbing or cyanosis.  HEENT: Normal.    Telemetry    A fib 80s (Personally reviewed)   Labs  CBC Recent Labs    10/03/22 0435 10/03/22 1043 10/04/22 0455  WBC 7.0  --  6.6  HGB 9.2* 10.2*  10.2* 9.5*  HCT 27.0* 30.0*  30.0* 28.3*  MCV 89.4  --  90.1  PLT 362  --  99991111   Basic Metabolic Panel Recent Labs    10/03/22 0435 10/03/22 1043 10/04/22 0455  NA 133* 134*  134* 133*  K 3.5 3.3*  3.3* 3.4*  CL 91*  --  87*  CO2 29  --  29  GLUCOSE 109*  --  147*  BUN 47*  --  47*  CREATININE 1.54*  --  1.49*  CALCIUM 9.5  --  9.9  MG 1.8  --  2.1   BNP: BNP (last 3 results) Recent Labs    11/20/21 1409 04/06/22 1253 09/12/22 2101  BNP 485.0* 682.5* 577.3*   ProBNP (last 3 results) Recent Labs    08/15/22 1049 09/04/22 1404 09/11/22 1530  PROBNP 5,315* 11,525* 8,444*   Imaging   CARDIAC CATHETERIZATION  Result Date: 10/03/2022 1. Right and left heart filling pressures remain elevated.  Surprisingly, V-waves in PCWP tracing  not prominent but LA is very large. 2. 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 3. Preserved cardiac output. 4. RV function reasonable with PAPi 2.9. Continue aggressive diuresis. Timing of Mitraclip to be determined.    Medications:    Scheduled Medications:  atorvastatin  80 mg Oral Daily   chlorhexidine  15 mL Mouth/Throat Once   Chlorhexidine Gluconate Cloth  6 each Topical Daily   midodrine  2.5 mg Oral TID WC   potassium chloride  40 mEq Oral BID   senna-docusate  1 tablet Oral Daily   sodium chloride flush  3 mL Intravenous Q12H   spironolactone  12.5 mg Oral Daily   umeclidinium bromide  1 puff Inhalation Daily   Infusions:  sodium chloride     sodium chloride      ceFAZolin (ANCEF) IV     furosemide (LASIX) 200 mg in dextrose 5 % 100 mL (2 mg/mL)  infusion 15 mg/hr (10/04/22 0254)   PRN Medications: sodium chloride, acetaminophen, albuterol, guaiFENesin-dextromethorphan, magnesium hydroxide, morphine injection, ondansetron (ZOFRAN) IV, oxyCODONE, sodium chloride flush  Patient Profile  Mr Bruni is 77 year old with a history of CAD, STEMI 2021, DES to LCx, permanent A fib, severe MR, HTN, HLD, sleep apnea, MR, chronic lymphedema, and HFpEF.    Admitted with A/C HFpEF.  Assessment/Plan   1. Acute on chronic HFpEF: With prominent RV dysfunction.  Echo this admission shows EF 50-55%, D-shaped septum, moderate-severe RV enlargement with moderate RV dysfunction, PASP 69, severe biatrial enlargement, moderate AS with mean gradient 21 mmHg/AVA 1.2 cm^2, IVC dilated. Diuretics held 2/26 w/ ABLA from rectus sheath hematoma. Had worsening HF/dyspnea>>120 mg IV Lasix + BiPAP. Lasix infusion started 2/28.  Initially on dobutamine for RV support, now off.  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  CI 2.6, PAPi adequate at 2.9.  Lasix gtt restarted at 15 mg/hr.  Creatinine stable 1.49, co-ox 63%, CVP down to 11-12 today with good diuresis yesterday, weight down another 5 lbs.  - Today will continue Lasix gtt at 15 mg/hr.  Will give metolazone 2.5 x 1 and replace K.  - Continue spironolactone 12.5 daily.  - On midodrine 2.5 tid.  2. Mitral regurgitation: Severe MR by echo.  TEE on 3/12 showed LV EF 55%, D-shaped septum, moderate RV dysfunction, severe functional MR (suspect atrial functional, also significant posterior leaflet restriction), mild TR, moderate aortic stenosis.  - mTEER evaluation ongoing => after [place] on 3/13, structural team decided to postpone mTEER which had been scheduled for today, next available day will be next Wednesday. Will get him as diuresed as possible in preparation for mTEER Wednesday.   3. AKI on CKD 3: Suspect cardiorenal in  etiology. Creatinine stable today at 1.49.  4. Atrial fibrillation: Permanent, has been seen by EP in the past. Not likely to successfully cardiovert and stay in NSR.  Eliquis restarted on 3/2 (held prior for rectus sheath hematoma from heparin).   - Continue Eliquis.  5. CAD:   Had DES to mLCx in 10/21.  No chest pain.  - Continue atorvastatin - No ASA with Eliquis use.  6. LE wounds: Followed as outpatient by Dr. Sharol Given.  ABIs normal this admission.  - Wound care - Improved 7. Left Rectus Sheath Hematoma: 15.0 x 7.4 x 14.7 cm hematoma on CT 2/26.  - Resolved 8. Anemia, acute blood loss: in setting of rectus sheath hematoma.  Now stable.  - hgb stable, 9.5 today 9. Aortic stenosis: Moderate on TEE.    Loralie Champagne 10/04/2022 9:04 AM

## 2022-10-04 NOTE — Plan of Care (Signed)
  Problem: Education: Goal: Knowledge of General Education information will improve Description: Including pain rating scale, medication(s)/side effects and non-pharmacologic comfort measures Outcome: Progressing   Problem: Clinical Measurements: Goal: Will remain free from infection Outcome: Progressing   

## 2022-10-05 ENCOUNTER — Encounter (HOSPITAL_COMMUNITY): Payer: Self-pay | Admitting: Cardiology

## 2022-10-05 DIAGNOSIS — I5033 Acute on chronic diastolic (congestive) heart failure: Secondary | ICD-10-CM | POA: Diagnosis not present

## 2022-10-05 LAB — COOXEMETRY PANEL
Carboxyhemoglobin: 2.4 % — ABNORMAL HIGH (ref 0.5–1.5)
Methemoglobin: <0.7 % (ref 0.0–1.5)
O2 Saturation: 64.2 %
Total hemoglobin: 9.7 g/dL — ABNORMAL LOW (ref 12.0–16.0)

## 2022-10-05 LAB — CBC
HCT: 28.4 % — ABNORMAL LOW (ref 39.0–52.0)
Hemoglobin: 9.4 g/dL — ABNORMAL LOW (ref 13.0–17.0)
MCH: 29.8 pg (ref 26.0–34.0)
MCHC: 33.1 g/dL (ref 30.0–36.0)
MCV: 90.2 fL (ref 80.0–100.0)
Platelets: 392 10*3/uL (ref 150–400)
RBC: 3.15 MIL/uL — ABNORMAL LOW (ref 4.22–5.81)
RDW: 17.1 % — ABNORMAL HIGH (ref 11.5–15.5)
WBC: 6.8 10*3/uL (ref 4.0–10.5)
nRBC: 0 % (ref 0.0–0.2)

## 2022-10-05 LAB — BASIC METABOLIC PANEL
Anion gap: 10 (ref 5–15)
BUN: 48 mg/dL — ABNORMAL HIGH (ref 8–23)
CO2: 30 mmol/L (ref 22–32)
Calcium: 9.7 mg/dL (ref 8.9–10.3)
Chloride: 90 mmol/L — ABNORMAL LOW (ref 98–111)
Creatinine, Ser: 1.56 mg/dL — ABNORMAL HIGH (ref 0.61–1.24)
GFR, Estimated: 45 mL/min — ABNORMAL LOW (ref >60)
Glucose, Bld: 110 mg/dL — ABNORMAL HIGH (ref 70–99)
Potassium: 3.6 mmol/L (ref 3.5–5.1)
Sodium: 130 mmol/L — ABNORMAL LOW (ref 135–145)

## 2022-10-05 LAB — MAGNESIUM: Magnesium: 2.1 mg/dL (ref 1.7–2.4)

## 2022-10-05 MED ORDER — FUROSEMIDE 10 MG/ML IJ SOLN
80.0000 mg | Freq: Two times a day (BID) | INTRAMUSCULAR | Status: DC
  Administered 2022-10-05 – 2022-10-07 (×5): 80 mg via INTRAVENOUS
  Filled 2022-10-05 (×5): qty 8

## 2022-10-05 MED ORDER — POTASSIUM CHLORIDE CRYS ER 20 MEQ PO TBCR
40.0000 meq | EXTENDED_RELEASE_TABLET | Freq: Two times a day (BID) | ORAL | Status: DC
  Administered 2022-10-05 – 2022-10-11 (×13): 40 meq via ORAL
  Filled 2022-10-05 (×14): qty 2

## 2022-10-05 MED ORDER — METOLAZONE 2.5 MG PO TABS
2.5000 mg | ORAL_TABLET | Freq: Once | ORAL | Status: AC
  Administered 2022-10-05: 2.5 mg via ORAL
  Filled 2022-10-05: qty 1

## 2022-10-05 NOTE — Progress Notes (Addendum)
Patient ID: Ryan Garner, male   DOB: 12-29-1945, 77 y.o.   MRN: [id number]     Advanced Heart Failure Rounding Note  PCP-Cardiologist: Dorris Carnes, MD   Subjective:   2/26 acute abdominal pain>>CT A/P shows large left rectus sheath hematoma. Heparin d/ced. Lasix gtt stopped.  2/27 acute HF w/ acute worsening of dyspnea>>120 mg IV Lasix + BiPAP 3/3 Lasix drip increased to 20mg  per hour and given metolazone.   Feels good this morning, sitting on EOB, weight down another 5 lbs.  He is back on Lasix gtt 15 mg/hr.  Co-ox 64%, CVP 5-8.  Creatinine stable at 1.54 => 1.49 => 1.56.   RHC (3/13):  Hemodynamics (mmHg) RA mean 17 RV 78/17 PA 78/28, mean 46 PCWP mean 24. V waves were not prominent.  Oxygen saturations: PA 53% AO 94% Cardiac Output (Fick) 5.66  Cardiac Index (Fick) 2.59 PVR 3.9 WU PAPi 2.9   TEE (3/12): LV EF 55%, D-shaped septum, moderate RV dysfunction, severe functional Ryan (suspect atrial functional), mild TR, moderate aortic stenosis.    Objective:   Weight Range: Body mass index is 33.86 kg/m.   Vital Signs:   Temp:  [97.5 F (36.4 C)-98.5 F (36.9 C)] 97.7 F (36.5 C) (03/15 0448) Pulse Rate:  [51-94] 94 (03/15 0448) Resp:  [16-18] 17 (03/15 0448) BP: (97-107)/(62-70) 97/62 (03/15 0032) SpO2:  [94 %-99 %] 96 % (03/15 0448) Weight:  [101 kg-102.2 kg] 101 kg (03/15 0448) Last BM Date : 10/02/22  Weight change: Filed Weights   10/04/22 0036 10/04/22 1034 10/05/22 0448  Weight: 103.7 kg 102.2 kg 101 kg    Intake/Output:   Intake/Output Summary (Last 24 hours) at 10/05/2022 0852 Last data filed at 10/05/2022 0451 Gross per 24 hour  Intake 1200 ml  Output 2800 ml  Net -1600 ml   Physical Exam  CVP 5-8 General:  elderly appearing.  No respiratory difficulty HEENT: normal Neck: supple. JVD ~8 cm. Carotids 2+ bilat; no bruits. No lymphadenopathy or thyromegaly appreciated. Cor: PMI nondisplaced. Regular rate & irregular rhythm. No rubs, gallops or  murmurs. Lungs: coarse Abdomen: soft, nontender, nondistended. No hepatosplenomegaly. No bruits or masses. Good bowel sounds. Extremities: no cyanosis, clubbing, rash, +1 BLE edema to knee. PICC RUE Neuro: alert & oriented x 3, cranial nerves grossly intact. moves all 4 extremities w/o difficulty. Affect pleasant.  Telemetry    A fib 80s (Personally reviewed)   Labs  CBC Recent Labs    10/04/22 0455 10/05/22 0610  WBC 6.6 6.8  HGB 9.5* 9.4*  HCT 28.3* 28.4*  MCV 90.1 90.2  PLT 382 0000000   Basic Metabolic Panel Recent Labs    10/04/22 0455 10/05/22 0610  NA 133* 130*  K 3.4* 3.6  CL 87* 90*  CO2 29 30  GLUCOSE 147* 110*  BUN 47* 48*  CREATININE 1.49* 1.56*  CALCIUM 9.9 9.7  MG 2.1 2.1   BNP: BNP (last 3 results) Recent Labs    11/20/21 1409 04/06/22 1253 09/12/22 2101  BNP 485.0* 682.5* 577.3*   ProBNP (last 3 results) Recent Labs    08/15/22 1049 09/04/22 1404 09/11/22 1530  PROBNP 5,315* 11,525* 8,444*   Imaging   No results found.  Medications:    Scheduled Medications:  apixaban  5 mg Oral BID   atorvastatin  80 mg Oral Daily   chlorhexidine  15 mL Mouth/Throat Once   Chlorhexidine Gluconate Cloth  6 each Topical Daily   midodrine  2.5 mg Oral TID  WC   senna-docusate  1 tablet Oral Daily   sodium chloride flush  3 mL Intravenous Q12H   spironolactone  12.5 mg Oral Daily   umeclidinium bromide  1 puff Inhalation Daily   Infusions:  sodium chloride     sodium chloride     furosemide (LASIX) 200 mg in dextrose 5 % 100 mL (2 mg/mL) infusion 15 mg/hr (10/05/22 0805)   PRN Medications: sodium chloride, acetaminophen, albuterol, guaiFENesin-dextromethorphan, morphine injection, ondansetron (ZOFRAN) IV, oxyCODONE, sodium chloride flush  Patient Profile  Ryan Garner is 77 year old with a history of CAD, STEMI 2021, DES to LCx, permanent A fib, severe Ryan, HTN, HLD, sleep apnea, Ryan, chronic lymphedema, and HFpEF.    Admitted with A/C HFpEF.   Assessment/Plan  1. Acute on chronic HFpEF: With prominent RV dysfunction.  Echo this admission shows EF 50-55%, D-shaped septum, moderate-severe RV enlargement with moderate RV dysfunction, PASP 69, severe biatrial enlargement, moderate AS with mean gradient 21 mmHg/AVA 1.2 cm^2, IVC dilated. Diuretics held 2/26 w/ ABLA from rectus sheath hematoma. Had worsening HF/dyspnea>>120 mg IV Lasix + BiPAP. Lasix infusion started 2/28.  Initially on dobutamine for RV support, now off.  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  CI 2.6, PAPi adequate at 2.9.  Lasix gtt restarted at 15 mg/hr.  Creatinine stable 1.49, co-ox 64%, CVP down to 5-8 today with good diuresis yesterday, weight down another 3 lbs.  - Today will stop Lasix gtt at 15 mg/hr.  Transition to 80 IV BID. Follow CVP.   - Continue spironolactone 12.5 daily.  - On midodrine 2.5 tid.  2. Mitral regurgitation: Severe Ryan by echo.  TEE on 3/12 showed LV EF 55%, D-shaped septum, moderate RV dysfunction, severe functional Ryan (suspect atrial functional, also significant posterior leaflet restriction), mild TR, moderate aortic stenosis.  - mTEER evaluation ongoing => after [place] on 3/13, structural team decided to postpone mTEER which had been scheduled for today, next available day will be next Wednesday. Will get him as diuresed as possible in preparation for mTEER Wednesday 3/20.   3. AKI on CKD 3: Suspect cardiorenal in etiology. Creatinine stable today at 1.56.  4. Atrial fibrillation: Permanent, has been seen by EP in the past. Not likely to successfully cardiovert and stay in NSR.  Eliquis restarted on 3/2 (held prior for rectus sheath hematoma from heparin).   - Continue Eliquis.  5. CAD:   Had DES to mLCx in 10/21.  No chest pain.  - Continue atorvastatin - No ASA with Eliquis use.  6. LE wounds: Followed as outpatient by Dr. Sharol Given.  ABIs normal  this admission.  - Wound care - Improved 7. Left Rectus Sheath Hematoma: 15.0 x 7.4 x 14.7 cm hematoma on CT 2/26.  - Resolved 8. Anemia, acute blood loss: in setting of rectus sheath hematoma. Now stable.  - hgb stable, 9.4 today 9. Aortic stenosis: Moderate on TEE.  10. Dispo - Pt no longer candidate for CIR as he has significantly improved. Plan for [facility] PT/OT. May need to reevaluate post mTEER.   Discussed with structural team, plan for last doses of Eliquis 3/17. Hep gtt to start 3/18.  Earnie Larsson 10/05/2022 8:52 AM  Patient seen with NP, agree with the above note.   He diuresed well again today, weight down (now > 80 lbs). CVP 10-11 on my read.  Breathing better, walked to the end of  the hall.   General: NAD Neck: JVP 8-9 cm, no thyromegaly or thyroid nodule.  Lungs: Clear to auscultation bilaterally with normal respiratory effort. CV: Nondisplaced PMI.  Heart regular S1/S2, no S3/S4, 3/6 HSM apex.  1+ ankle edema.   Abdomen: Soft, nontender, no hepatosplenomegaly, no distention.  Skin: Intact without lesions or rashes.  Neurologic: Alert and oriented x 3.  Psych: Normal affect. Extremities: No clubbing or cyanosis.  HEENT: Normal.   Good diuresis, stable creatinine at 1.56. CVP 10-11. Agree with stopping Lasix gtt today.  Will switch him to Lasix 80 mg IV bid at least for a couple more days for a bit more diuresis.  Will give a dose of metolazone with pm Lasix.   Plan for mTEER on Wednesday, keep in hospital until then.  Need to optimize hemodynamics as much as possible prior. Hold Eliquis after Sunday.   Loralie Champagne 10/05/2022 10:45 AM

## 2022-10-05 NOTE — Progress Notes (Signed)
Orthopedic Tech Progress Note Patient Details:  JOURDYN SPAW 05/31/1946 [id number]  Ortho Devices Type of Ortho Device: Haematologist Ortho Device/Splint Location: BLE Ortho Device/Splint Interventions: Application, Ordered   Post Interventions Patient Tolerated: Well Instructions Provided: Care of device  Germain Koopmann A Bevin Mayall 10/05/2022, 4:27 PM

## 2022-10-05 NOTE — Progress Notes (Signed)
Occupational Therapy Treatment Patient Details Name: Ryan Garner MRN: [id number] DOB: 1946-06-25 Today's Date: 10/05/2022   History of present illness Pt is a 77 y.o. M who presents 09/12/2022 with A/C HFpEF. 2/26 acute abdominal pain>>CT A/P shows large left rectus sheath hematoma.Significant PMH: CAD, STEMI 2021, DES to LCx, permanent A fib, severe MR, HTN, HLD, sleep apnea, DM, rotator cuff repair, MR, chronic lymphedema, and HFpEF.   OT comments  Pt continuing to progress towards Pt focused goals. Pt reported he is hopeful to go home soon. Pt currently ambulating with Rollator and Supervision during today's OT session. Pt had no difficulties completing t/f to sit in Rollator to take a break SBA, Pt was also able to complete sit>stand from rollator SBA no assistance for push off. Pt expresses wants/needs throughout therapy session. Pt would benefit from continued skilled acute OT services to determine any additional needs/assistance when completing bADLs such as dressing, OT currently dropping frequency to 1x/wk due to significant improvements patient has made with functional mobility, energy conservation, most bADLs. Pt would benefit from home OT services to continue progressing with activity tolerance and maximize functional independence.    Recommendations for follow up therapy are one component of a multi-disciplinary discharge planning process, led by the attending physician.  Recommendations may be updated based on patient status, additional functional criteria and insurance authorization.    Follow Up Recommendations  Home health OT     Assistance Recommended at Discharge Frequent or constant Supervision/Assistance  Patient can return home with the following  A little help with walking and/or transfers;Assistance with cooking/housework;Help with stairs or ramp for entrance;Assist for transportation;A lot of help with bathing/dressing/bathroom   Equipment Recommendations  None  recommended by OT    Recommendations for Other Services      Precautions / Restrictions Precautions Precautions: Fall Restrictions Weight Bearing Restrictions: No       Mobility Bed Mobility               General bed mobility comments: Pt received sitting in recliner    Transfers Overall transfer level: Needs assistance Equipment used: Rollator (4 wheels) Transfers: Sit to/from Stand Sit to Stand: Supervision           General transfer comment: Pt t/f toRollator and to sit down with SBA, Pt returned to chair with Supervision + Rollator     Balance             Standing balance-Leahy Scale: Fair                             ADL either performed or assessed with clinical judgement   ADL Overall ADL's : Needs assistance/impaired                                     Functional mobility during ADLs: Rolling walker (2 wheels);Supervision/safety General ADL Comments: Pt ambulated in hallway to other side and back needing one rest break.    Extremity/Trunk Assessment Upper Extremity Assessment Upper Extremity Assessment: Generalized weakness   Lower Extremity Assessment Lower Extremity Assessment: Defer to PT evaluation   Cervical / Trunk Assessment Cervical / Trunk Assessment: Kyphotic    Vision Baseline Vision/History: 0 No visual deficits Vision Assessment?: No apparent visual deficits   Perception     Praxis Praxis Praxis: Intact    Cognition Arousal/Alertness: Awake/alert Behavior During Therapy:  Flat affect Overall Cognitive Status: Within Functional Limits for tasks assessed                                 General Comments: Pt slightly engage in more conversation with OT encouragement and probing        Exercises      Shoulder Instructions       General Comments VSS on RA    Pertinent Vitals/ Pain       Pain Assessment Pain Assessment: No/denies pain  Home Living                                           Prior Functioning/Environment              Frequency  Min 1X/week        Progress Toward Goals  OT Goals(current goals can now be found in the care plan section)  Progress towards OT goals: Progressing toward goals  Acute Rehab OT Goals Patient Stated Goal: to get better OT Goal Formulation: With patient Time For Goal Achievement: 10/12/22 Potential to Achieve Goals: Good  Plan Frequency needs to be updated;Discharge plan remains appropriate    Co-evaluation                 AM-PAC OT "6 Clicks" Daily Activity     Outcome Measure   Help from another person eating meals?: None Help from another person taking care of personal grooming?: A Little Help from another person toileting, which includes using toliet, bedpan, or urinal?: A Lot Help from another person bathing (including washing, rinsing, drying)?: A Lot Help from another person to put on and taking off regular upper body clothing?: A Little Help from another person to put on and taking off regular lower body clothing?: A Lot 6 Click Score: 16    End of Session Equipment Utilized During Treatment: Rollator (4 wheels)  OT Visit Diagnosis: Unsteadiness on feet (R26.81);Other abnormalities of gait and mobility (R26.89);Muscle weakness (generalized) (M62.81)   Activity Tolerance Patient tolerated treatment well   Patient Left in chair;with call bell/phone within reach   Nurse Communication Mobility status        Time: [id number] OT Time Calculation (min): 27 min  Charges: OT General Charges $OT Visit: 1 Visit OT Treatments $Therapeutic Activity: 23-37 mins  10/05/2022  AB, OTR/L  Acute Rehabilitation Services  Office: [phone]   Cori Razor 10/05/2022, 5:27 PM

## 2022-10-05 NOTE — Plan of Care (Signed)
°  Problem: Education: °Goal: Knowledge of General Education information will improve °Description: Including pain rating scale, medication(s)/side effects and non-pharmacologic comfort measures °Outcome: Progressing °  °Problem: Activity: °Goal: Risk for activity intolerance will decrease °Outcome: Progressing °  °Problem: Elimination: °Goal: Will not experience complications related to urinary retention °Outcome: Progressing °  °

## 2022-10-06 DIAGNOSIS — I5033 Acute on chronic diastolic (congestive) heart failure: Secondary | ICD-10-CM | POA: Diagnosis not present

## 2022-10-06 LAB — BASIC METABOLIC PANEL
Anion gap: 14 (ref 5–15)
BUN: 50 mg/dL — ABNORMAL HIGH (ref 8–23)
CO2: 29 mmol/L (ref 22–32)
Calcium: 9.9 mg/dL (ref 8.9–10.3)
Chloride: 86 mmol/L — ABNORMAL LOW (ref 98–111)
Creatinine, Ser: 1.66 mg/dL — ABNORMAL HIGH (ref 0.61–1.24)
GFR, Estimated: 42 mL/min — ABNORMAL LOW (ref >60)
Glucose, Bld: 115 mg/dL — ABNORMAL HIGH (ref 70–99)
Potassium: 3.9 mmol/L (ref 3.5–5.1)
Sodium: 129 mmol/L — ABNORMAL LOW (ref 135–145)

## 2022-10-06 LAB — CBC
HCT: 29.2 % — ABNORMAL LOW (ref 39.0–52.0)
Hemoglobin: 9.7 g/dL — ABNORMAL LOW (ref 13.0–17.0)
MCH: 30.1 pg (ref 26.0–34.0)
MCHC: 33.2 g/dL (ref 30.0–36.0)
MCV: 90.7 fL (ref 80.0–100.0)
Platelets: 375 10*3/uL (ref 150–400)
RBC: 3.22 MIL/uL — ABNORMAL LOW (ref 4.22–5.81)
RDW: 16.7 % — ABNORMAL HIGH (ref 11.5–15.5)
WBC: 6.8 10*3/uL (ref 4.0–10.5)
nRBC: 0 % (ref 0.0–0.2)

## 2022-10-06 LAB — COOXEMETRY PANEL
Carboxyhemoglobin: 2.3 % — ABNORMAL HIGH (ref 0.5–1.5)
Methemoglobin: 0.6 % (ref 0.0–1.5)
O2 Saturation: 63.2 %
Total hemoglobin: 10 g/dL — ABNORMAL LOW (ref 12.0–16.0)

## 2022-10-06 LAB — MAGNESIUM: Magnesium: 1.8 mg/dL (ref 1.7–2.4)

## 2022-10-06 MED ORDER — METOLAZONE 5 MG PO TABS
5.0000 mg | ORAL_TABLET | Freq: Once | ORAL | Status: AC
  Administered 2022-10-06: 5 mg via ORAL
  Filled 2022-10-06: qty 1

## 2022-10-06 MED ORDER — MAGNESIUM SULFATE 2 GM/50ML IV SOLN
2.0000 g | Freq: Once | INTRAVENOUS | Status: AC
  Administered 2022-10-06: 2 g via INTRAVENOUS
  Filled 2022-10-06: qty 50

## 2022-10-06 NOTE — Progress Notes (Addendum)
Patient ID: Ryan Garner, male   DOB: 1946-02-02, 77 y.o.   MRN: [id number]     Advanced Heart Failure Rounding Note  PCP-Cardiologist: Dorris Carnes, MD   Subjective:   2/26 acute abdominal pain>>CT A/P shows large left rectus sheath hematoma. Heparin d/ced. Lasix gtt stopped.  2/27 acute HF w/ acute worsening of dyspnea>>120 mg IV Lasix + BiPAP 3/3 Lasix drip increased to 20mg  per hour and given metolazone.   Lasix gtt transitioned to lasix 80 IV bid yesterday. Weight down another 2 pounds. (87 pounds total). CVP 12 Co-ox 63% No BMET this am   Denies CP or SOB    RHC (3/13):  Hemodynamics (mmHg) RA mean 17 RV 78/17 PA 78/28, mean 46 PCWP mean 24. V waves were not prominent.  Oxygen saturations: PA 53% AO 94% Cardiac Output (Fick) 5.66  Cardiac Index (Fick) 2.59 PVR 3.9 WU PAPi 2.9   TEE (3/12): LV EF 55%, D-shaped septum, moderate RV dysfunction, severe functional Ryan (suspect atrial functional), mild TR, moderate aortic stenosis.    Objective:   Weight Range: Body mass index is 33.47 kg/m.   Vital Signs:   Temp:  [97.9 F (36.6 C)-98.7 F (37.1 C)] 98.4 F (36.9 C) (03/16 1139) Pulse Rate:  [70-96] 70 (03/16 1139) Resp:  [18-20] 18 (03/16 1139) BP: (97-112)/(69-79) 105/69 (03/16 1139) SpO2:  [98 %-99 %] 98 % (03/16 1139) Weight:  [99.8 kg] 99.8 kg (03/16 0602) Last BM Date : 10/06/22  Weight change: Filed Weights   10/04/22 1034 10/05/22 0448 10/06/22 0602  Weight: 102.2 kg 101 kg 99.8 kg    Intake/Output:   Intake/Output Summary (Last 24 hours) at 10/06/2022 1304 Last data filed at 10/06/2022 1228 Gross per 24 hour  Intake 960 ml  Output 1401 ml  Net -441 ml    Physical Exam   General:  Sitting in chair. No resp difficulty HEENT: normal Neck: supple.JVP 12 . Carotids 2+ bilat; no bruits. No lymphadenopathy or thryomegaly appreciated. Cor: PMI nondisplaced. Irregular rate & rhythm. 2/6 AS 2/6 Ryan Lungs: clear Abdomen: obese soft, nontender,  nondistended. No hepatosplenomegaly. No bruits or masses. Good bowel sounds. Extremities: no cyanosis, clubbing, rash, + UNNA 2+ edema  Neuro: alert & orientedx3, cranial nerves grossly intact. moves all 4 extremities w/o difficulty. Affect pleasant  Telemetry    A fib 70-80s (Personally reviewed)   Labs  CBC Recent Labs    10/05/22 0610 10/06/22 0607  WBC 6.8 6.8  HGB 9.4* 9.7*  HCT 28.4* 29.2*  MCV 90.2 90.7  PLT 392 123456    Basic Metabolic Panel Recent Labs    10/04/22 0455 10/05/22 0610 10/06/22 0607  NA 133* 130*  --   K 3.4* 3.6  --   CL 87* 90*  --   CO2 29 30  --   GLUCOSE 147* 110*  --   BUN 47* 48*  --   CREATININE 1.49* 1.56*  --   CALCIUM 9.9 9.7  --   MG 2.1 2.1 1.8    BNP: BNP (last 3 results) Recent Labs    11/20/21 1409 04/06/22 1253 09/12/22 2101  BNP 485.0* 682.5* 577.3*    ProBNP (last 3 results) Recent Labs    08/15/22 1049 09/04/22 1404 09/11/22 1530  PROBNP 5,315* 11,525* 8,444*    Imaging   No results found.  Medications:    Scheduled Medications:  apixaban  5 mg Oral BID   atorvastatin  80 mg Oral Daily   chlorhexidine  15  mL Mouth/Throat Once   Chlorhexidine Gluconate Cloth  6 each Topical Daily   furosemide  80 mg Intravenous BID   midodrine  2.5 mg Oral TID WC   potassium chloride  40 mEq Oral BID   senna-docusate  1 tablet Oral Daily   sodium chloride flush  3 mL Intravenous Q12H   spironolactone  12.5 mg Oral Daily   umeclidinium bromide  1 puff Inhalation Daily   Infusions:  sodium chloride     sodium chloride     PRN Medications: sodium chloride, acetaminophen, albuterol, guaiFENesin-dextromethorphan, morphine injection, ondansetron (ZOFRAN) IV, oxyCODONE, sodium chloride flush  Patient Profile  Ryan Garner is 77 year old with a history of CAD, STEMI 2021, DES to LCx, permanent A fib, severe Ryan, HTN, HLD, sleep apnea, Ryan, chronic lymphedema, and HFpEF.    Admitted with A/C HFpEF.  Assessment/Plan  1.  Acute on chronic HFpEF: With prominent RV dysfunction.  Echo this admission shows EF 50-55%, D-shaped septum, moderate-severe RV enlargement with moderate RV dysfunction, PASP 69, severe biatrial enlargement, moderate AS with mean gradient 21 mmHg/AVA 1.2 cm^2, IVC dilated. Diuretics held 2/26 w/ ABLA from rectus sheath hematoma. Had worsening HF/dyspnea>>120 mg IV Lasix + BiPAP. Lasix infusion started 2/28.  Initially on dobutamine for RV support, now off.  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  CI 2.6, PAPi adequate at 2.9.  - Continues to diurese on IV lasix. Weight down 85 pounds - CVP 12 this am. Will give metolazone 5mg   - Needs BMET - Will continue IV lasix one more day - Continue spironolactone 12.5 daily.  - On midodrine 2.5 tid.  2. Mitral regurgitation: Severe Ryan by echo.  TEE on 3/12 showed LV EF 55%, D-shaped septum, moderate RV dysfunction, severe functional Ryan (suspect atrial functional, also significant posterior leaflet restriction), mild TR, moderate aortic stenosis.  - mTEER tentatively planned for Wednesday 3/20.   - given his prolonged hospitalization, I wonder if it may be best for him to go home for 1-2 weeks prior to mTEER to rehab first. Will d/w Structural Team & Dr. Aundra Dubin  3. AKI on CKD 3: Suspect cardiorenal in etiology. Scr has been stable at 1.5 - Repeat BMET today 4. Atrial fibrillation: Permanent, has been seen by EP in the past. Not likely to successfully cardiovert and stay in NSR.  Eliquis restarted on 3/2 (held prior for rectus sheath hematoma from heparin).   - Continue Eliquis.  5. CAD:   Had DES to mLCx in 10/21.  No s/s angina - Continue atorvastatin - No ASA with Eliquis use.  6. LE wounds: Followed as outpatient by Dr. Sharol Given.  ABIs normal this admission.  - Wound care - Improved 7. Left Rectus Sheath Hematoma: 15.0 x 7.4 x 14.7 cm hematoma on CT 2/26.   - Resolved 8. Anemia, acute blood loss: in setting of rectus sheath hematoma. Now stable.  - hgb stable, 9.7 today 9. Aortic stenosis: Moderate on TEE.  10. Dispo - Pt no longer candidate for CIR as he has significantly improved. Plan for [facility] PT/OT. May need to reevaluate post mTEER. 11. Hypomag - supp    Discussed with structural team, plan for last doses of Eliquis 3/17. Hep gtt to start 3/18.  Glori Bickers MD 10/06/2022 1:04 PM

## 2022-10-06 NOTE — Progress Notes (Signed)
Mobility Specialist Progress Note:   10/06/22 0900  Mobility  Activity Ambulated with assistance in hallway  Level of Assistance Standby assist, set-up cues, supervision of patient - no hands on  Assistive Device Four wheel walker  Distance Ambulated (ft) 500 ft  Activity Response Tolerated well  Mobility Referral Yes  $Mobility charge 1 Mobility   Pt eager for mobility session. Required no physical assistance throughout ambulation. X1 seated rest break required d/t fatigue. Pt back sitting EOB with all needs met.   Nelta Numbers Mobility Specialist Please contact via SecureChat or  Rehab office at [phone]

## 2022-10-07 DIAGNOSIS — I5033 Acute on chronic diastolic (congestive) heart failure: Secondary | ICD-10-CM | POA: Diagnosis not present

## 2022-10-07 LAB — BASIC METABOLIC PANEL
Anion gap: 10 (ref 5–15)
BUN: 53 mg/dL — ABNORMAL HIGH (ref 8–23)
CO2: 29 mmol/L (ref 22–32)
Calcium: 9.7 mg/dL (ref 8.9–10.3)
Chloride: 89 mmol/L — ABNORMAL LOW (ref 98–111)
Creatinine, Ser: 1.59 mg/dL — ABNORMAL HIGH (ref 0.61–1.24)
GFR, Estimated: 44 mL/min — ABNORMAL LOW (ref >60)
Glucose, Bld: 123 mg/dL — ABNORMAL HIGH (ref 70–99)
Potassium: 3.8 mmol/L (ref 3.5–5.1)
Sodium: 128 mmol/L — ABNORMAL LOW (ref 135–145)

## 2022-10-07 LAB — MAGNESIUM: Magnesium: 2.2 mg/dL (ref 1.7–2.4)

## 2022-10-07 LAB — COOXEMETRY PANEL
Carboxyhemoglobin: 2 % — ABNORMAL HIGH (ref 0.5–1.5)
Methemoglobin: <0.7 % (ref 0.0–1.5)
O2 Saturation: 65.2 %
Total hemoglobin: 10.3 g/dL — ABNORMAL LOW (ref 12.0–16.0)

## 2022-10-07 MED ORDER — TORSEMIDE 20 MG PO TABS
80.0000 mg | ORAL_TABLET | Freq: Two times a day (BID) | ORAL | Status: DC
  Administered 2022-10-07 – 2022-10-10 (×6): 80 mg via ORAL
  Filled 2022-10-07 (×6): qty 4

## 2022-10-07 MED ORDER — HEPARIN (PORCINE) 25000 UT/250ML-% IV SOLN
1200.0000 [IU]/h | INTRAVENOUS | Status: DC
  Administered 2022-10-08 – 2022-10-09 (×2): 1400 [IU]/h via INTRAVENOUS
  Administered 2022-10-10: 1300 [IU]/h via INTRAVENOUS
  Filled 2022-10-07 (×3): qty 250

## 2022-10-07 NOTE — Progress Notes (Signed)
Patient ID: Ryan Garner, male   DOB: May 26, 1946, 77 y.o.   MRN: [id number]     Advanced Heart Failure Rounding Note  PCP-Cardiologist: Dorris Carnes, MD   Subjective:   2/26 acute abdominal pain>>CT A/P shows large left rectus sheath hematoma. Heparin d/ced. Lasix gtt stopped.  2/27 acute HF w/ acute worsening of dyspnea>>120 mg IV Lasix + BiPAP 3/3 Lasix drip increased to 20mg  per hour and given metolazone.   Remains on lasix 80 IV bid. Weight unchanged. (87 pounds total). CVP 10 Co-ox 65% Scr stable   Sitting in chair. Denies CP or SOB. Happy about [employer] winning the game    RHC (3/13):  Hemodynamics (mmHg) RA mean 17 RV 78/17 PA 78/28, mean 46 PCWP mean 24. V waves were not prominent.  Oxygen saturations: PA 53% AO 94% Cardiac Output (Fick) 5.66  Cardiac Index (Fick) 2.59 PVR 3.9 WU PAPi 2.9   TEE (3/12): LV EF 55%, D-shaped septum, moderate RV dysfunction, severe functional Ryan (suspect atrial functional), mild TR, moderate aortic stenosis.    Objective:   Weight Range: Body mass index is 33.62 kg/m.   Vital Signs:   Temp:  [97.5 F (36.4 C)-98 F (36.7 C)] 97.7 F (36.5 C) (03/17 1143) Pulse Rate:  [80-92] 83 (03/17 1143) Resp:  [17-18] 18 (03/17 1143) BP: (92-107)/(44-83) 99/44 (03/17 1143) SpO2:  [94 %-100 %] 97 % (03/17 1143) Weight:  [100.3 kg] 100.3 kg (03/17 0426) Last BM Date : 10/06/22  Weight change: Filed Weights   10/05/22 0448 10/06/22 0602 10/07/22 0426  Weight: 101 kg 99.8 kg 100.3 kg    Intake/Output:   Intake/Output Summary (Last 24 hours) at 10/07/2022 1239 Last data filed at 10/07/2022 0901 Gross per 24 hour  Intake 360 ml  Output 2275 ml  Net -1915 ml    Physical Exam   General:  Sitting in chair. No resp difficulty HEENT: normal Neck: supple. JVP to jaw Carotids 2+ bilat; no bruits. No lymphadenopathy or thryomegaly appreciated. Cor: Irregular rate & rhythm. 2/6 SEM RUSB Lungs: clear Abdomen: obese  soft, nontender,  nondistended. No hepatosplenomegaly. No bruits or masses. Good bowel sounds. Extremities: no cyanosis, clubbing, rash, 1+ edema + UNNA Neuro: alert & orientedx3, cranial nerves grossly intact. moves all 4 extremities w/o difficulty. Affect pleasant   Telemetry    A fib 70-80s(Personally reviewed)   Labs  CBC Recent Labs    10/05/22 0610 10/06/22 0607  WBC 6.8 6.8  HGB 9.4* 9.7*  HCT 28.4* 29.2*  MCV 90.2 90.7  PLT 392 123456    Basic Metabolic Panel Recent Labs    10/06/22 0607 10/06/22 2110 10/07/22 0450  NA  --  129* 128*  K  --  3.9 3.8  CL  --  86* 89*  CO2  --  29 29  GLUCOSE  --  115* 123*  BUN  --  50* 53*  CREATININE  --  1.66* 1.59*  CALCIUM  --  9.9 9.7  MG 1.8  --  2.2    BNP: BNP (last 3 results) Recent Labs    11/20/21 1409 04/06/22 1253 09/12/22 2101  BNP 485.0* 682.5* 577.3*    ProBNP (last 3 results) Recent Labs    08/15/22 1049 09/04/22 1404 09/11/22 1530  PROBNP 5,315* 11,525* 8,444*    Imaging   No results found.  Medications:    Scheduled Medications:  apixaban  5 mg Oral BID   atorvastatin  80 mg Oral Daily   chlorhexidine  15 mL Mouth/Throat Once   Chlorhexidine Gluconate Cloth  6 each Topical Daily   furosemide  80 mg Intravenous BID   midodrine  2.5 mg Oral TID WC   potassium chloride  40 mEq Oral BID   senna-docusate  1 tablet Oral Daily   sodium chloride flush  3 mL Intravenous Q12H   spironolactone  12.5 mg Oral Daily   umeclidinium bromide  1 puff Inhalation Daily   Infusions:  sodium chloride     sodium chloride     [START ON 10/08/2022] heparin     PRN Medications: sodium chloride, acetaminophen, albuterol, guaiFENesin-dextromethorphan, morphine injection, ondansetron (ZOFRAN) IV, oxyCODONE, sodium chloride flush  Patient Profile  Ryan Garner is 77 year old with a history of CAD, STEMI 2021, DES to LCx, permanent A fib, severe Ryan, HTN, HLD, sleep apnea, Ryan, chronic lymphedema, and HFpEF.    Admitted with  A/C HFpEF.  Assessment/Plan  1. Acute on chronic HFpEF: With prominent RV dysfunction.  Echo this admission shows EF 50-55%, D-shaped septum, moderate-severe RV enlargement with moderate RV dysfunction, PASP 69, severe biatrial enlargement, moderate AS with mean gradient 21 mmHg/AVA 1.2 cm^2, IVC dilated. Diuretics held 2/26 w/ ABLA from rectus sheath hematoma. Had worsening HF/dyspnea>>120 mg IV Lasix + BiPAP. Lasix infusion started 2/28.  Initially on dobutamine for RV support, now off.  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  CI 2.6, PAPi adequate at 2.9.  - Remains on IV lasix. Weight stable todaey. Overall down 85 pounds - Co-ox 65% CVP 10 - Needs BMET - Switch IV lasix to po torsemide 80 bid (was on lasix 120 bid PTA) - Continue spironolactone 12.5 daily.  - On midodrine 2.5 tid.  2. Mitral regurgitation: Severe Ryan by echo.  TEE on 3/12 showed LV EF 55%, D-shaped septum, moderate RV dysfunction, severe functional Ryan (suspect atrial functional, also significant posterior leaflet restriction), mild TR, moderate aortic stenosis.  - mTEER tentatively planned for Wednesday 3/20.   - given his prolonged hospitalization, I wonder if it may be best for him to go home for 1-2 weeks prior to mTEER to rehab first. Will d/w Structural Team & Dr. Aundra Dubin. RHC reviewed filling pressures up with mixed pre & post capillary components. No prominent v-waves in PCWP tracing 3. AKI on CKD 3: Suspect cardiorenal in etiology.  -Scr has been stable at 1.5-1.6 - Daily BMET 4. Atrial fibrillation: Permanent, has been seen by EP in the past. Not likely to successfully cardiovert and stay in NSR.  Eliquis restarted on 3/2 (held prior for rectus sheath hematoma from heparin).   - Continue Eliquis.  5. CAD:   Had DES to mLCx in 10/21.  No s/s angina - Continue atorvastatin - No ASA with Eliquis use.  6. LE wounds:  Followed as outpatient by Dr. Sharol Given.  ABIs normal this admission.  - Wound care - Improved 7. Left Rectus Sheath Hematoma: 15.0 x 7.4 x 14.7 cm hematoma on CT 2/26.  - Resolved 8. Anemia, acute blood loss: in setting of rectus sheath hematoma. Now stable.  - hgb stable, 9.7 9. Aortic stenosis: Moderate on TEE.  10. Dispo - Pt no longer candidate for CIR as he has significantly improved. Plan for [facility] PT/OT. May need to reevaluate post mTEER. 11. Hypomag - supped mg 2.2 today   Discussed with structural team, plan for last doses of Eliquis 3/17. Hep gtt to start 3/18.  Glori Bickers MD 10/07/2022 12:39 PM

## 2022-10-07 NOTE — Progress Notes (Addendum)
ANTICOAGULATION CONSULT NOTE - Initial Consult  Pharmacy Consult for heparin Indication: atrial fibrillation  Allergies  Allergen Reactions   Flecainide     Per patient dizziness, shortness of breath, blurred vision   Metoprolol Tartrate Itching   Rofecoxib Rash and Other (See Comments)    (Vioxx)    Patient Measurements: Height: 5\' 8"  (172.7 cm) Weight: 100.3 kg (221 lb 1.6 oz) IBW/kg (Calculated) : 68.4 HEPARIN DW (KG): 91.7   Vital Signs: Temp: 97.7 F (36.5 C) (03/17 1143) Temp Source: Oral (03/17 1143) BP: 99/44 (03/17 1143) Pulse Rate: 83 (03/17 1143)  Labs: Recent Labs    10/05/22 0610 10/06/22 0607 10/06/22 2110 10/07/22 0450  HGB 9.4* 9.7*  --   --   HCT 28.4* 29.2*  --   --   PLT 392 375  --   --   CREATININE 1.56*  --  1.66* 1.59*    Estimated Creatinine Clearance: 44 mL/min (A) (by C-G formula based on SCr of 1.59 mg/dL (H)).   Medical History: Past Medical History:  Diagnosis Date   A-fib ([place])    Acute on chronic diastolic CHF (congestive heart failure) ([place]) 07/21/2020   Coronary artery disease    Diabetes mellitus without complication (HCC)    Dyspnea    Dysrhythmia    A-fib   Hyperlipidemia    Hypertension    Metabolic syndrome    Prediabetes    Sleep apnea    STEMI (ST elevation myocardial infarction) [facility])     Assessment: Patient admitted with acute on chronic HFpEF. Severe mitral regurgitaion on echo. Tentative plan for mTEER 3/20. Currently on apixaban with plan for last dose 3/17 @ 2200. Plan to transition to heparin 3/18 AM. Pharmacy consulted to dose heparin.   Hgb 9.7 and plt 375, stable. No bleeding noted in patient chart. Will monitor with aPTTs given recent apixaban dosing. Continue with tentative plan for mTEER Wednesday.   Goal of Therapy:  Heparin level 0.3-0.7 units/ml aPTT 66-102 seconds Monitor platelets by anticoagulation protocol: Yes   Plan:  Start heparin infusion at 1400 units/hr on 3/18 1000 Will f/u  heparin aPTT in 8 hours after drip started  Monitor daily heparin level, aPTT, and CBC Continue to monitor H&H     Gena Fray, PharmD PGY1 Pharmacy Resident   10/07/2022 1:42 PM

## 2022-10-08 ENCOUNTER — Other Ambulatory Visit (HOSPITAL_COMMUNITY): Payer: Medicare Other

## 2022-10-08 DIAGNOSIS — I5033 Acute on chronic diastolic (congestive) heart failure: Secondary | ICD-10-CM | POA: Diagnosis not present

## 2022-10-08 LAB — CBC
HCT: 31.6 % — ABNORMAL LOW (ref 39.0–52.0)
Hemoglobin: 10.4 g/dL — ABNORMAL LOW (ref 13.0–17.0)
MCH: 29.5 pg (ref 26.0–34.0)
MCHC: 32.9 g/dL (ref 30.0–36.0)
MCV: 89.5 fL (ref 80.0–100.0)
Platelets: 369 10*3/uL (ref 150–400)
RBC: 3.53 MIL/uL — ABNORMAL LOW (ref 4.22–5.81)
RDW: 16.1 % — ABNORMAL HIGH (ref 11.5–15.5)
WBC: 7 10*3/uL (ref 4.0–10.5)
nRBC: 0 % (ref 0.0–0.2)

## 2022-10-08 LAB — BASIC METABOLIC PANEL
Anion gap: 10 (ref 5–15)
BUN: 52 mg/dL — ABNORMAL HIGH (ref 8–23)
CO2: 30 mmol/L (ref 22–32)
Calcium: 9.8 mg/dL (ref 8.9–10.3)
Chloride: 90 mmol/L — ABNORMAL LOW (ref 98–111)
Creatinine, Ser: 1.51 mg/dL — ABNORMAL HIGH (ref 0.61–1.24)
GFR, Estimated: 47 mL/min — ABNORMAL LOW (ref >60)
Glucose, Bld: 121 mg/dL — ABNORMAL HIGH (ref 70–99)
Potassium: 3.4 mmol/L — ABNORMAL LOW (ref 3.5–5.1)
Sodium: 130 mmol/L — ABNORMAL LOW (ref 135–145)

## 2022-10-08 LAB — MAGNESIUM: Magnesium: 2 mg/dL (ref 1.7–2.4)

## 2022-10-08 LAB — HEPARIN LEVEL (UNFRACTIONATED)
Heparin Unfractionated: >1.1 IU/mL — ABNORMAL HIGH (ref 0.30–0.70)
Heparin Unfractionated: >1.1 IU/mL — ABNORMAL HIGH (ref 0.30–0.70)

## 2022-10-08 LAB — APTT
aPTT: >200 seconds (ref 24–36)
aPTT: 78 seconds — ABNORMAL HIGH (ref 24–36)

## 2022-10-08 LAB — COOXEMETRY PANEL
Carboxyhemoglobin: 1.1 % (ref 0.5–1.5)
Methemoglobin: <0.7 % (ref 0.0–1.5)
O2 Saturation: 57.8 %
Total hemoglobin: 10.8 g/dL — ABNORMAL LOW (ref 12.0–16.0)

## 2022-10-08 MED ORDER — POTASSIUM CHLORIDE CRYS ER 20 MEQ PO TBCR
40.0000 meq | EXTENDED_RELEASE_TABLET | Freq: Once | ORAL | Status: AC
  Administered 2022-10-08: 40 meq via ORAL
  Filled 2022-10-08: qty 2

## 2022-10-08 NOTE — Telephone Encounter (Signed)
Patient Advocate Encounter  Contacted [employer] for update. Patient was previously approved from 09/06/22 until 07/23/2023. Last shipped on 09/11/22.

## 2022-10-08 NOTE — Progress Notes (Signed)
ANTICOAGULATION CONSULT NOTE  Pharmacy Consult for heparin Indication: atrial fibrillation  Allergies  Allergen Reactions   Flecainide     Per patient dizziness, shortness of breath, blurred vision   Metoprolol Tartrate Itching   Rofecoxib Rash and Other (See Comments)    (Vioxx)    Patient Measurements: Height: 5\' 8"  (172.7 cm) Weight: 98.8 kg (217 lb 13 oz) IBW/kg (Calculated) : 68.4 HEPARIN DW (KG): 91.7   Vital Signs: Temp: 97.7 F (36.5 C) (03/18 1645) Temp Source: Oral (03/18 1645) BP: 119/78 (03/18 1645) Pulse Rate: 82 (03/18 1645)  Labs: Recent Labs    10/06/22 0607 10/06/22 2110 10/07/22 0450 10/08/22 0505 10/08/22 1729 10/08/22 1850  HGB 9.7*  --   --  10.4*  --   --   HCT 29.2*  --   --  31.6*  --   --   PLT 375  --   --  369  --   --   APTT  --   --   --   --  >200* 78*  HEPARINUNFRC  --   --   --   --  >1.10* >1.10*  CREATININE  --  1.66* 1.59* 1.51*  --   --      Estimated Creatinine Clearance: 46 mL/min (A) (by C-G formula based on SCr of 1.51 mg/dL (H)).   Medical History: Past Medical History:  Diagnosis Date   A-fib ([place])    Acute on chronic diastolic CHF (congestive heart failure) ([place]) 07/21/2020   Coronary artery disease    Diabetes mellitus without complication (HCC)    Dyspnea    Dysrhythmia    A-fib   Hyperlipidemia    Hypertension    Metabolic syndrome    Prediabetes    Sleep apnea    STEMI (ST elevation myocardial infarction) [facility])     Assessment: Patient admitted with acute on chronic HFpEF. Severe mitral regurgitaion on echo. Tentative plan for mTEER 3/20. Currently on apixaban with plan for last dose 3/17 @ 2200. Plan to transition to heparin 3/18 AM. Pharmacy consulted to dose heparin.   Hgb 10.4 and plt 369, stable. No bleeding noted in patient chart. Will monitor with aPTTs given recent apixaban dosing.   Continue with tentative plan for mTEER Wednesday.   Initial level came back elevated because it was drawn  from the PICC. Peripheral came back therapeutic. HL and aPTT are not correlating yet.   Goal of Therapy:  Heparin level 0.3-0.7 units/ml aPTT 66-102 seconds Monitor platelets by anticoagulation protocol: Yes   Plan:  Cont heparin infusion at 1400 units/hr  Will f/u heparin aPTT in AM Monitor daily heparin level, aPTT, and CBC Continue to monitor H&H    Onnie Boer, PharmD, BCIDP, AAHIVP, CPP Infectious Disease Pharmacist 10/08/2022 7:49 PM

## 2022-10-08 NOTE — Progress Notes (Addendum)
Patient ID: Ryan Garner, male   DOB: 1946/04/17, 77 y.o.   MRN: [id number]     Advanced Heart Failure Rounding Note  PCP-Cardiologist: Dorris Carnes, MD   Subjective:   2/26 acute abdominal pain>>CT A/P shows large left rectus sheath hematoma. Heparin d/ced. Lasix gtt stopped.  2/27 acute HF w/ acute worsening of dyspnea>>120 mg IV Lasix + BiPAP 3/3 Lasix drip increased to 20mg  per hour and given metolazone.  3/17 switched to torsemide.    Feels ok.    RHC (3/13):  Hemodynamics (mmHg) RA mean 17 RV 78/17 PA 78/28, mean 46 PCWP mean 24. V waves were not prominent.  Oxygen saturations: PA 53% AO 94% Cardiac Output (Fick) 5.66  Cardiac Index (Fick) 2.59 PVR 3.9 WU PAPi 2.9   TEE (3/12): LV EF 55%, D-shaped septum, moderate RV dysfunction, severe functional Ryan (suspect atrial functional), mild TR, moderate aortic stenosis.    Objective:   Weight Range: Body mass index is 33.12 kg/m.   Vital Signs:   Temp:  [97.7 F (36.5 C)-98.7 F (37.1 C)] 98.7 F (37.1 C) (03/18 0743) Pulse Rate:  [73-93] 93 (03/18 0743) Resp:  [17-18] 18 (03/18 0743) BP: (99-115)/(44-80) 115/76 (03/18 0743) SpO2:  [97 %-100 %] 100 % (03/18 0743) Weight:  [98.8 kg] 98.8 kg (03/18 0621) Last BM Date : 10/07/22  Weight change: Filed Weights   10/06/22 0602 10/07/22 0426 10/08/22 0621  Weight: 99.8 kg 100.3 kg 98.8 kg    Intake/Output:   Intake/Output Summary (Last 24 hours) at 10/08/2022 0834 Last data filed at 10/08/2022 0700 Gross per 24 hour  Intake 960 ml  Output 2350 ml  Net -1390 ml   Physical Exam  General: Sitting in the chair.  No resp difficulty HEENT: normal Neck: supple. JVP 5-6 . Carotids 2+ bilat; no bruits. No lymphadenopathy or thryomegaly appreciated. Cor: PMI nondisplaced. Irregular rate & rhythm. No rubs, gallops. 2/6 SEM RUSB Lungs: clear Abdomen: soft, nontender, nondistended. No hepatosplenomegaly. No bruits or masses. Good bowel sounds. Extremities: no cyanosis,  clubbing, rash, edema Neuro: alert & orientedx3, cranial nerves grossly intact. moves all 4 extremities w/o difficulty. Affect pleasant   Telemetry   A fib 70-90s   Labs  CBC Recent Labs    10/06/22 0607 10/08/22 0505  WBC 6.8 7.0  HGB 9.7* 10.4*  HCT 29.2* 31.6*  MCV 90.7 89.5  PLT 375 0000000   Basic Metabolic Panel Recent Labs    10/07/22 0450 10/08/22 0505  NA 128* 130*  K 3.8 3.4*  CL 89* 90*  CO2 29 30  GLUCOSE 123* 121*  BUN 53* 52*  CREATININE 1.59* 1.51*  CALCIUM 9.7 9.8  MG 2.2 2.0   BNP: BNP (last 3 results) Recent Labs    11/20/21 1409 04/06/22 1253 09/12/22 2101  BNP 485.0* 682.5* 577.3*   ProBNP (last 3 results) Recent Labs    08/15/22 1049 09/04/22 1404 09/11/22 1530  PROBNP 5,315* 11,525* 8,444*   Imaging   No results found.  Medications:    Scheduled Medications:  atorvastatin  80 mg Oral Daily   chlorhexidine  15 mL Mouth/Throat Once   Chlorhexidine Gluconate Cloth  6 each Topical Daily   midodrine  2.5 mg Oral TID WC   potassium chloride  40 mEq Oral BID   senna-docusate  1 tablet Oral Daily   sodium chloride flush  3 mL Intravenous Q12H   spironolactone  12.5 mg Oral Daily   torsemide  80 mg Oral BID  umeclidinium bromide  1 puff Inhalation Daily   Infusions:  sodium chloride     sodium chloride     heparin     PRN Medications: sodium chloride, acetaminophen, albuterol, guaiFENesin-dextromethorphan, morphine injection, ondansetron (ZOFRAN) IV, oxyCODONE, sodium chloride flush  Patient Profile  Ryan Garner is 77 year old with a history of CAD, STEMI 2021, DES to LCx, permanent A fib, severe Ryan, HTN, HLD, sleep apnea, Ryan, chronic lymphedema, and HFpEF.    Admitted with A/C HFpEF.  Assessment/Plan  1. Acute on chronic HFpEF: With prominent RV dysfunction.  Echo this admission shows EF 50-55%, D-shaped septum, moderate-severe RV enlargement with moderate RV dysfunction, PASP 69, severe biatrial enlargement, moderate AS with  mean gradient 21 mmHg/AVA 1.2 cm^2, IVC dilated. Diuretics held 2/26 w/ ABLA from rectus sheath hematoma. Had worsening HF/dyspnea>>120 mg IV Lasix + BiPAP. Lasix infusion started 2/28.  Initially on dobutamine for RV support, now off.  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  CI 2.6, PAPi adequate at 2.9.  -Volume status improved. CVP 4-5.  Overall down 90 pounds - Co-ox 58% CVP  - Continue po torsemide 80 bid (was on lasix 120 bid PTA) - Continue spironolactone 12.5 daily.  - On midodrine 2.5 tid.  2. Mitral regurgitation: Severe Ryan by echo.  TEE on 3/12 showed LV EF 55%, D-shaped septum, moderate RV dysfunction, severe functional Ryan (suspect atrial functional, also significant posterior leaflet restriction), mild TR, moderate aortic stenosis.  - mTEER tentatively planned for Wednesday 3/20.   -  RHC reviewed filling pressures up with mixed pre & post capillary components.  3. AKI on CKD 3: Suspect cardiorenal in etiology.  -Scr stable 1.5  - Daily BMET 4. Atrial fibrillation: Permanent, has been seen by EP in the past. Not likely to successfully cardiovert and stay in NSR.  Eliquis restarted on 3/2 (held prior for rectus sheath hematoma from heparin).   Back in A fib with controlled rate.  - Off eliquis and will switch to Heparin drip today.   5. CAD:   Had DES to mLCx in 10/21.  No s/s angina - Continue atorvastatin - No ASA with Eliquis use.  6. LE wounds: Followed as outpatient by Dr. Sharol Given.  ABIs normal this admission.  - Wound care - Improved 7. Left Rectus Sheath Hematoma: 15.0 x 7.4 x 14.7 cm hematoma on CT 2/26.  - Resolved 8. Anemia, acute blood loss: in setting of rectus sheath hematoma. Now stable.  - hgb stable, 10.4  9. Aortic stenosis: Moderate on TEE.  10. Dispo - Pt no longer candidate for CIR as he has significantly improved. Plan for [facility] PT/OT. May need to reevaluate  post mTEER. 11. Hypomag - supped mg 2.0 today   Switch to heparin drip today. MTEER 3/20  Amy Clegg NP-C  10/08/2022 8:34 AM  Patient seen and examined with the above-signed Advanced Practice Provider and/or Housestaff. I personally reviewed laboratory data, imaging studies and relevant notes. I independently examined the patient and formulated the important aspects of the plan. I have edited the note to reflect any of my changes or salient points. I have personally discussed the plan with the patient and/or family.  Denies CP or SOB. Off inotropes. Co-ox stable at 58%. Scr 1.5 Now on po diuretics.   Switch eliquis to heparin for mTEER Wednesday.   Glori Bickers, MD  9:11 AM

## 2022-10-08 NOTE — Progress Notes (Signed)
Orthopedic Tech Progress Note Patient Details:  Ryan Garner 06-11-1946 [id number]  Ortho Devices Type of Ortho Device: Haematologist Ortho Device/Splint Location: Bi LE Ortho Device/Splint Interventions: Application   Post Interventions Patient Tolerated: Well Instructions Provided: Care of device  Ryan Garner [name] 10/08/2022, 1:46 PM

## 2022-10-08 NOTE — Progress Notes (Signed)
Physical Therapy Treatment Patient Details Name: Ryan Garner MRN: [id number] DOB: 08/22/1945 Today's Date: 10/08/2022   History of Present Illness Pt is a 77 y.o. M who presents 09/12/2022 with A/C HFpEF. 2/26 acute abdominal pain>>CT A/P shows large left rectus sheath hematoma.Significant PMH: CAD, STEMI 2021, DES to LCx, permanent A fib, severe MR, HTN, HLD, sleep apnea, DM, rotator cuff repair, MR, chronic lymphedema, and HFpEF.    PT Comments    Pt tolerated treatment well today. Pt with similar presentation to previous sessions. No change in DC/DME recs at this time. Pt has heart procedure scheduled for 3/20. PT will continue to follow.  Recommendations for follow up therapy are one component of a multi-disciplinary discharge planning process, led by the attending physician.  Recommendations may be updated based on patient status, additional functional criteria and insurance authorization.  Follow Up Recommendations  Home health PT Can patient physically be transported by private vehicle: Yes   Assistance Recommended at Discharge Frequent or constant Supervision/Assistance  Patient can return home with the following Assistance with cooking/housework;Help with stairs or ramp for entrance;Assist for transportation;A little help with walking and/or transfers;A little help with bathing/dressing/bathroom   Equipment Recommendations  None recommended by PT    Recommendations for Other Services       Precautions / Restrictions Precautions Precautions: Fall Restrictions Weight Bearing Restrictions: No     Mobility  Bed Mobility               General bed mobility comments: Pt received sitting in recliner    Transfers Overall transfer level: Modified independent Equipment used: Rollator (4 wheels) Transfers: Sit to/from Stand Sit to Stand: Modified independent (Device/Increase time)                Ambulation/Gait Ambulation/Gait assistance: Supervision Gait  Distance (Feet): 600 Feet (1 seated rest break) Assistive device: Rollator (4 wheels) Gait Pattern/deviations: Step-through pattern, Decreased stride length Gait velocity: decreased     General Gait Details: no LOB noted. 1 prolonged standing rest break   Stairs             Wheelchair Mobility    Modified Rankin (Stroke Patients Only)       Balance Overall balance assessment: Needs assistance Sitting-balance support: Feet supported Sitting balance-Leahy Scale: Normal Sitting balance - Comments: In chair   Standing balance support: Bilateral upper extremity supported, During functional activity, Reliant on assistive device for balance Standing balance-Leahy Scale: Fair Standing balance comment: Reliant on AD                            Cognition Arousal/Alertness: Awake/alert Behavior During Therapy: Flat affect Overall Cognitive Status: Within Functional Limits for tasks assessed                                 General Comments: Pt more engaged in conversation today compared to previous sessions.        Exercises      General Comments General comments (skin integrity, edema, etc.): VSS on RA      Pertinent Vitals/Pain Pain Assessment Pain Assessment: No/denies pain    Home Living                          Prior Function            PT Goals (current goals  can now be found in the care plan section) Progress towards PT goals: Progressing toward goals    Frequency    Min 3X/week      PT Plan Current plan remains appropriate    Co-evaluation              AM-PAC PT "6 Clicks" Mobility   Outcome Measure  Help needed turning from your back to your side while in a flat bed without using bedrails?: A Little Help needed moving from lying on your back to sitting on the side of a flat bed without using bedrails?: A Little Help needed moving to and from a bed to a chair (including a wheelchair)?: A  Little Help needed standing up from a chair using your arms (e.g., wheelchair or bedside chair)?: A Little Help needed to walk in hospital room?: A Little Help needed climbing 3-5 steps with a railing? : A Lot 6 Click Score: 17    End of Session Equipment Utilized During Treatment: Gait belt Activity Tolerance: Patient tolerated treatment well Patient left: in chair;with call bell/phone within reach;with family/visitor present Nurse Communication: Mobility status PT Visit Diagnosis: Muscle weakness (generalized) (M62.81);Difficulty in walking, not elsewhere classified (R26.2)     Time: [id number] PT Time Calculation (min) (ACUTE ONLY): 29 min  Charges:  $Gait Training: 23-37 mins                     Shelby Mattocks, PT, DPT Acute Rehab Services [id number]    Viann Shove 10/08/2022, 3:15 PM

## 2022-10-08 NOTE — Progress Notes (Deleted)
ANTICOAGULATION CONSULT NOTE  Pharmacy Consult for heparin Indication: atrial fibrillation  Allergies  Allergen Reactions   Flecainide     Per patient dizziness, shortness of breath, blurred vision   Metoprolol Tartrate Itching   Rofecoxib Rash and Other (See Comments)    (Vioxx)    Patient Measurements: Height: 5\' 8"  (172.7 cm) Weight: 98.8 kg (217 lb 13 oz) IBW/kg (Calculated) : 68.4 HEPARIN DW (KG): 91.7   Vital Signs: Temp: 97.7 F (36.5 C) (03/18 1645) Temp Source: Oral (03/18 1645) BP: 119/78 (03/18 1645) Pulse Rate: 82 (03/18 1645)  Labs: Recent Labs    10/06/22 0607 10/06/22 2110 10/07/22 0450 10/08/22 0505 10/08/22 1729  HGB 9.7*  --   --  10.4*  --   HCT 29.2*  --   --  31.6*  --   PLT 375  --   --  369  --   APTT  --   --   --   --  >200*  CREATININE  --  1.66* 1.59* 1.51*  --      Estimated Creatinine Clearance: 46 mL/min (A) (by C-G formula based on SCr of 1.51 mg/dL (H)).   Medical History: Past Medical History:  Diagnosis Date   A-fib ([place])    Acute on chronic diastolic CHF (congestive heart failure) ([place]) 07/21/2020   Coronary artery disease    Diabetes mellitus without complication (HCC)    Dyspnea    Dysrhythmia    A-fib   Hyperlipidemia    Hypertension    Metabolic syndrome    Prediabetes    Sleep apnea    STEMI (ST [facility] infarction) [facility])     Assessment: Patient admitted with acute on chronic HFpEF. Severe mitral regurgitaion on echo. Tentative plan for mTEER 3/20. Currently on apixaban with plan for last dose 3/17 @ 2200. Transitioned to heparin 3/18 at 0900. Pharmacy consulted to dose heparin.   Hgb 10.4 and plt 369, stable. No bleeding noted in patient chart. Will monitor with aPTTs given recent apixaban dosing.   Continue with tentative plan for mTEER Wednesday.   aPTT >200 - drawn from PICC infusing heparin - erroneous lab Repeat aPTT 78, therapeutic, drawn appropriately Current heparin infusion rate:  1400 units/hr No s/sx of bleeding per RN report  Goal of Therapy:  Heparin level 0.3-0.7 units/ml aPTT 66-102 seconds Monitor platelets by anticoagulation protocol: Yes   Plan:  Continue heparin infusion at 1400 units/hr Monitor daily heparin level, aPTT, and CBC Continue to monitor H&H and for s/sx of bleeding   Luisa Hart, PharmD, BCPS Clinical Pharmacist 10/08/2022 7:50 PM   Please refer to [facility] for pharmacy phone number

## 2022-10-08 NOTE — Progress Notes (Signed)
Lab called with critical APTT >200. Sample came from PICC and IV heparin is infusing in PICC. Charge RN put in STAT aPTT order for recollection by lab. Charge RN informed primary RN, cards, and pharmacy.

## 2022-10-08 NOTE — Progress Notes (Signed)
[place] for heparin Indication: atrial fibrillation  Allergies  Allergen Reactions   Flecainide     Per patient dizziness, shortness of breath, blurred vision   Metoprolol Tartrate Itching   Rofecoxib Rash and Other (See Comments)    (Vioxx)    Patient Measurements: Height: 5\' 8"  (172.7 cm) Weight: 98.8 kg (217 lb 13 oz) IBW/kg (Calculated) : 68.4 HEPARIN DW (KG): 91.7   Vital Signs: Temp: 97.6 F (36.4 C) (03/18 1150) Temp Source: Oral (03/18 1150) BP: 96/59 (03/18 1150) Pulse Rate: 85 (03/18 1150)  Labs: Recent Labs    10/06/22 0607 10/06/22 2110 10/07/22 0450 10/08/22 0505  HGB 9.7*  --   --  10.4*  HCT 29.2*  --   --  31.6*  PLT 375  --   --  369  CREATININE  --  1.66* 1.59* 1.51*     Estimated Creatinine Clearance: 46 mL/min (A) (by C-G formula based on SCr of 1.51 mg/dL (H)).   Medical History: Past Medical History:  Diagnosis Date   A-fib ([place])    Acute on chronic diastolic CHF (congestive heart failure) ([place]) 07/21/2020   Coronary artery disease    Diabetes mellitus without complication (HCC)    Dyspnea    Dysrhythmia    A-fib   Hyperlipidemia    Hypertension    Metabolic syndrome    Prediabetes    Sleep apnea    STEMI (ST elevation myocardial infarction) [facility])     Assessment: Patient admitted with acute on chronic HFpEF. Severe mitral regurgitaion on echo. Tentative plan for mTEER 3/20. Currently on apixaban with plan for last dose 3/17 @ 2200. Plan to transition to heparin 3/18 AM. Pharmacy consulted to dose heparin.   Hgb 10.4 and plt 369, stable. No bleeding noted in patient chart. Will monitor with aPTTs given recent apixaban dosing.   Continue with tentative plan for mTEER Wednesday.   Goal of Therapy:  Heparin level 0.3-0.7 units/ml aPTT 66-102 seconds Monitor platelets by anticoagulation protocol: Yes   Plan:  Started heparin infusion at 1400 units/hr at 10 AM today. Will f/u heparin  aPTT in 8 hours after drip started  Monitor daily heparin level, aPTT, and CBC Continue to monitor H&H     [place] Crane, Vena [place], BCPS, BCCP Clinical Pharmacist  10/08/2022 1:38 PM   [facility] pharmacy phone numbers are listed on [email or web address]

## 2022-10-09 DIAGNOSIS — I340 Nonrheumatic mitral (valve) insufficiency: Secondary | ICD-10-CM | POA: Diagnosis not present

## 2022-10-09 DIAGNOSIS — I5033 Acute on chronic diastolic (congestive) heart failure: Secondary | ICD-10-CM | POA: Diagnosis not present

## 2022-10-09 LAB — BASIC METABOLIC PANEL
Anion gap: 12 (ref 5–15)
BUN: 54 mg/dL — ABNORMAL HIGH (ref 8–23)
CO2: 30 mmol/L (ref 22–32)
Calcium: 10.1 mg/dL (ref 8.9–10.3)
Chloride: 89 mmol/L — ABNORMAL LOW (ref 98–111)
Creatinine, Ser: 1.63 mg/dL — ABNORMAL HIGH (ref 0.61–1.24)
GFR, Estimated: 43 mL/min — ABNORMAL LOW (ref >60)
Glucose, Bld: 126 mg/dL — ABNORMAL HIGH (ref 70–99)
Potassium: 3.9 mmol/L (ref 3.5–5.1)
Sodium: 131 mmol/L — ABNORMAL LOW (ref 135–145)

## 2022-10-09 LAB — COOXEMETRY PANEL
Carboxyhemoglobin: 2.3 % — ABNORMAL HIGH (ref 0.5–1.5)
Methemoglobin: 0.8 % (ref 0.0–1.5)
O2 Saturation: 72.4 %
Total hemoglobin: 11 g/dL — ABNORMAL LOW (ref 12.0–16.0)

## 2022-10-09 LAB — CBC
HCT: 31.9 % — ABNORMAL LOW (ref 39.0–52.0)
Hemoglobin: 10.9 g/dL — ABNORMAL LOW (ref 13.0–17.0)
MCH: 30.1 pg (ref 26.0–34.0)
MCHC: 34.2 g/dL (ref 30.0–36.0)
MCV: 88.1 fL (ref 80.0–100.0)
Platelets: 363 10*3/uL (ref 150–400)
RBC: 3.62 MIL/uL — ABNORMAL LOW (ref 4.22–5.81)
RDW: 16.2 % — ABNORMAL HIGH (ref 11.5–15.5)
WBC: 6.9 10*3/uL (ref 4.0–10.5)
nRBC: 0 % (ref 0.0–0.2)

## 2022-10-09 LAB — APTT
aPTT: 107 seconds — ABNORMAL HIGH (ref 24–36)
aPTT: 103 seconds — ABNORMAL HIGH (ref 24–36)
aPTT: 94 seconds — ABNORMAL HIGH (ref 24–36)

## 2022-10-09 LAB — SURGICAL PCR SCREEN
MRSA, PCR: NEGATIVE
Staphylococcus aureus: NEGATIVE

## 2022-10-09 LAB — HEPARIN LEVEL (UNFRACTIONATED): Heparin Unfractionated: >1.1 IU/mL — ABNORMAL HIGH (ref 0.30–0.70)

## 2022-10-09 LAB — MAGNESIUM: Magnesium: 1.8 mg/dL (ref 1.7–2.4)

## 2022-10-09 MED ORDER — CEFAZOLIN SODIUM-DEXTROSE 2-4 GM/100ML-% IV SOLN
2.0000 g | INTRAVENOUS | Status: AC
  Administered 2022-10-09: 2 g via INTRAVENOUS
  Filled 2022-10-09: qty 100

## 2022-10-09 MED ORDER — CHLORHEXIDINE GLUCONATE 0.12 % MT SOLN
15.0000 mL | Freq: Once | OROMUCOSAL | Status: AC
  Administered 2022-10-10: 15 mL via OROMUCOSAL
  Filled 2022-10-09: qty 15

## 2022-10-09 MED ORDER — SODIUM CHLORIDE 0.9 % IV SOLN: INTRAVENOUS | Status: DC

## 2022-10-09 MED ORDER — CHLORHEXIDINE GLUCONATE CLOTH 2 % EX PADS
6.0000 | MEDICATED_PAD | Freq: Once | CUTANEOUS | Status: AC
  Administered 2022-10-09: 6 via TOPICAL

## 2022-10-09 MED ORDER — MAGNESIUM SULFATE 2 GM/50ML IV SOLN
2.0000 g | Freq: Once | INTRAVENOUS | Status: AC
  Administered 2022-10-09: 2 g via INTRAVENOUS
  Filled 2022-10-09: qty 50

## 2022-10-09 MED ORDER — CHLORHEXIDINE GLUCONATE CLOTH 2 % EX PADS
6.0000 | MEDICATED_PAD | Freq: Once | CUTANEOUS | Status: AC
  Administered 2022-10-10: 6 via TOPICAL

## 2022-10-09 NOTE — H&P (View-Only) (Signed)
Rounding Note    Patient Name: Ryan Garner Date of Encounter: 10/09/2022  [place] Cardiologist: Dorris Carnes, MD   Subjective   Patient sitting up in a chair at the bedside.  States that his breathing is much better and he feels much better.  He has had an enormous amount of fluid out with 90 pounds of weight loss since he has been hospitalized.  Inpatient Medications    Scheduled Meds:  atorvastatin  80 mg Oral Daily   chlorhexidine  15 mL Mouth/Throat Once   Chlorhexidine Gluconate Cloth  6 each Topical Daily   midodrine  2.5 mg Oral TID WC   potassium chloride  40 mEq Oral BID   senna-docusate  1 tablet Oral Daily   sodium chloride flush  3 mL Intravenous Q12H   spironolactone  12.5 mg Oral Daily   torsemide  80 mg Oral BID   umeclidinium bromide  1 puff Inhalation Daily   Continuous Infusions:  sodium chloride     sodium chloride     heparin 1,300 Units/hr (10/09/22 0634)   magnesium sulfate bolus IVPB     PRN Meds: sodium chloride, acetaminophen, albuterol, guaiFENesin-dextromethorphan, morphine injection, ondansetron (ZOFRAN) IV, oxyCODONE, sodium chloride flush   Vital Signs    Vitals:   10/09/22 0239 10/09/22 0505 10/09/22 0813 10/09/22 1120  BP:  110/75 104/67 107/71  Pulse:  79 92 96  Resp:  18 15 20   Temp:  98 F (36.7 C) 97.7 F (36.5 C)   TempSrc:  Oral Oral Oral  SpO2:  100% 100% 98%  Weight: 98.6 kg     Height:        Intake/Output Summary (Last 24 hours) at 10/09/2022 1211 Last data filed at 10/09/2022 0800 Gross per 24 hour  Intake 862.07 ml  Output 2650 ml  Net -1787.93 ml      10/09/2022    2:39 AM 10/08/2022    6:21 AM 10/07/2022    4:26 AM  Last 3 Weights  Weight (lbs) 217 lb 6 oz 217 lb 13 oz 221 lb 1.6 oz  Weight (kg) 98.6 kg 98.8 kg 100.29 kg       Physical Exam  Alert, oriented, elderly male GEN: No acute distress.   Neck: No JVD Cardiac: RRR, 2/6 systolic murmur at the left lower sternal  border Respiratory: Diminished breath sounds in the bases otherwise clear to auscultation bilaterally. GI: Soft, nontender, non-distended  MS: No edema; No deformity. Neuro:  Nonfocal  Psych: Normal affect   Labs    High Sensitivity Troponin:  No results for input(s): "TROPONINIHS" in the last 720 hours.   Chemistry Recent Labs  Lab 10/07/22 0450 10/08/22 0505 10/09/22 0505  NA 128* 130* 131*  K 3.8 3.4* 3.9  CL 89* 90* 89*  CO2 29 30 30   GLUCOSE 123* 121* 126*  BUN 53* 52* 54*  CREATININE 1.59* 1.51* 1.63*  CALCIUM 9.7 9.8 10.1  MG 2.2 2.0 1.8  GFRNONAA 44* 47* 43*  ANIONGAP 10 10 12     Lipids No results for input(s): "CHOL", "TRIG", "HDL", "LABVLDL", "LDLCALC", "CHOLHDL" in the last 168 hours.  Hematology Recent Labs  Lab 10/06/22 0607 10/08/22 0505 10/09/22 0505  WBC 6.8 7.0 6.9  RBC 3.22* 3.53* 3.62*  HGB 9.7* 10.4* 10.9*  HCT 29.2* 31.6* 31.9*  MCV 90.7 89.5 88.1  MCH 30.1 29.5 30.1  MCHC 33.2 32.9 34.2  RDW 16.7* 16.1* 16.2*  PLT 375 369 363   Thyroid  No results for input(s): "TSH", "FREET4" in the last 168 hours.  BNPNo results for input(s): "BNP", "PROBNP" in the last 168 hours.  DDimer No results for input(s): "DDIMER" in the last 168 hours.   Radiology    No results found.  Cardiac Studies   TEE: 1. Left ventricular ejection fraction, by estimation, is 55%. The left  ventricle has normal function. The left ventricle has no regional wall  motion abnormalities.   2. Mildly D-shaped septum suggestive of RV pressure/volume overload. .  Right ventricular systolic function is moderately reduced. The right  ventricular size is moderately enlarged.   3. Peak RV-RA gradient 24 mmHg.   4. Left atrial size was severely dilated. No left atrial/left atrial  appendage thrombus was detected.   5. Right atrial size was severely dilated.   6. No PFO/ASD by bubble study.   7. There was severe mitral regurgitation and no stenosis. Inadequate  coaptation with  restriction of posterior leaflet, suspect atrial  functional MR. ERO 0.54 cm^2 by PISA, vena contracta area 0.52 cm^2. There  was slight flow reversal in all 4 pulmonary  veins. Mean gradient 3 mmHg.   8. AVA 1.2 cm^2 by planimetry. The aortic valve is tricuspid. There is  moderate calcification of the aortic valve. Aortic valve regurgitation is  not visualized. Moderate aortic valve stenosis. Aortic valve area, by VTI  measures 1.35 cm. Aortic valve  mean gradient measures 15.0 mmHg.   Patient Profile     77 y.o. male acute on chronic heart failure with preserved ejection fraction with prominent right heart component and massive volume overload.  The patient has undergone inotropic support and aggressive IV diuresis.  Found to have severe functional mitral regurgitation and referred for TEER  Assessment & Plan    Ryan Garner appears clinically stable with much better volume status after continue diuresis.  He underwent right heart catheterization March 13 after 60 pounds of diuresis but this demonstrated elevated right and left heart diastolic filling pressures.  He has been further diuresed with continued improvement in his volume status.  The patient is clinically stable to move forward with transcatheter edge-to-edge repair of the mitral valve.  He has been covered with IV heparin for atrial fibrillation.  He is noted to have moderate aortic stenosis as well.  After multidisciplinary team review including discussion with advanced heart failure, the patient is felt to be an appropriate candidate for transcatheter edge-to-edge repair of the mitral valve with a Pascal device.  I have reviewed risks, indications, and alternatives with the patient.  He understands that risks include but are not limited to vascular injury, infection, bleeding, cardiac injury, hemopericardium, emergency cardiac surgery, stroke, myocardial infarction, arrhythmia, device embolization, single leaflet device attachment, and  death.  He understands the risks of serious complications occur at a low frequency of approximately 2 to 3%.  He provides full informed consent for the procedure.  I called his son, Marylyn Ishihara, and we discussed our plans for proceeding with mitral valve TEER tomorrow afternoon.  Preoperative orders are written.  For questions or updates, please contact [place] Please consult [email or web address] for contact info under        Signed, [name], MD  10/09/2022, 12:11 PM

## 2022-10-09 NOTE — Progress Notes (Signed)
ANTICOAGULATION CONSULT NOTE - Follow Up Consult  Pharmacy Consult for heparin Indication: atrial fibrillation  Labs: Recent Labs    10/07/22 0450 10/08/22 0505 10/08/22 1729 10/08/22 1729 10/08/22 1850 10/09/22 0505 10/09/22 1430 10/09/22 1538  HGB  --  10.4*  --   --   --  10.9*  --   --   HCT  --  31.6*  --   --   --  31.9*  --   --   PLT  --  369  --   --   --  363  --   --   APTT  --   --  >200*   < > 78* 107* 103* 94*  HEPARINUNFRC  --   --  >1.10*  --  >1.10* >1.10*  --   --   CREATININE 1.59* 1.51*  --   --   --  1.63*  --   --    < > = values in this interval not displayed.     Assessment: 77yo male on therapeutic on heparin infusion at 1300 units/hr. This aPTT of 94 seconds was drawn peripherally (noted 103 sec one was drawn from central line after heparin held). No bleeding noted.  Goal of Therapy:  aPTT 66-102 seconds   Plan:  Continue heparin infusion at 1300 units/hr F/u aPTT in a.m. - will order peripheral sticks as central ones don't seem to be accurate with heparin running via that line  Sherlon Handing, PharmD, BCPS Please see amion for complete clinical pharmacist phone list 10/09/2022,5:12 PM

## 2022-10-09 NOTE — Progress Notes (Signed)
Occupational Therapy Treatment Patient Details Name: Ryan Garner MRN: [id number] DOB: 1946/03/16 Today's Date: 10/09/2022   History of present illness Pt is a 77 y.o. M who presents 09/12/2022 with A/C HFpEF. 2/26 acute abdominal pain>>CT A/P shows large left rectus sheath hematoma.Significant PMH: CAD, STEMI 2021, DES to LCx, permanent A fib, severe MR, HTN, HLD, sleep apnea, DM, rotator cuff repair, MR, chronic lymphedema, and HFpEF.   OT comments  Pt continuing to progress towards patient focused goals. Pt completing hall level ambulation with close supervision and Rollator, no rest breaks needed patient was able to ambulate from one end of hall and back. Pt also able to complete sit>stands no AD with close supervision for brief period of time. Patient would benefit from continued skilled acute OT services to address above deficits and help transition to next level of care. Pt would benefit from home OT services for post acute care to maximize functional independence.   Recommendations for follow up therapy are one component of a multi-disciplinary discharge planning process, led by the attending physician.  Recommendations may be updated based on patient status, additional functional criteria and insurance authorization.    Follow Up Recommendations  Home health OT     Assistance Recommended at Discharge Frequent or constant Supervision/Assistance  Patient can return home with the following  A little help with walking and/or transfers;Assistance with cooking/housework;Help with stairs or ramp for entrance;Assist for transportation;A lot of help with bathing/dressing/bathroom   Equipment Recommendations  None recommended by OT    Recommendations for Other Services      Precautions / Restrictions Precautions Precautions: Fall Restrictions Weight Bearing Restrictions: No       Mobility Bed Mobility               General bed mobility comments: Pt received sitting in  recliner    Transfers Overall transfer level: Modified independent Equipment used: Rollator (4 wheels) Transfers: Sit to/from Stand Sit to Stand: Modified independent (Device/Increase time)                 Balance Overall balance assessment: Needs assistance Sitting-balance support: Feet supported Sitting balance-Leahy Scale: Normal Sitting balance - Comments: In chair   Standing balance support: Bilateral upper extremity supported, During functional activity, Reliant on assistive device for balance Standing balance-Leahy Scale: Fair Standing balance comment: Pt able to stand without AD for brief periods                           ADL either performed or assessed with clinical judgement   ADL Overall ADL's : Needs assistance/impaired     Grooming: Wash/dry face;Sitting;Set up;Supervision/safety Grooming Details (indicate cue type and reason): washed face sitting in chair, setup assist                               General ADL Comments: Pt ambulated in hallway from room to other end of hall and back, no rest breaks    Extremity/Trunk Assessment Upper Extremity Assessment Upper Extremity Assessment: Generalized weakness   Lower Extremity Assessment Lower Extremity Assessment: Defer to PT evaluation   Cervical / Trunk Assessment Cervical / Trunk Assessment: Kyphotic    Vision Baseline Vision/History: 0 No visual deficits Vision Assessment?: No apparent visual deficits   Perception     Praxis      Cognition Arousal/Alertness: Awake/alert Behavior During Therapy: Flat affect Overall Cognitive Status: Within  Functional Limits for tasks assessed                                 General Comments: Pt more engaged in conversation today compared to previous sessions.        Exercises      Shoulder Instructions       General Comments VSS on RA    Pertinent Vitals/ Pain       Pain Assessment Pain Assessment: No/denies  pain  Home Living                                          Prior Functioning/Environment              Frequency  Min 1X/week        Progress Toward Goals  OT Goals(current goals can now be found in the care plan section)  Progress towards OT goals: Progressing toward goals  Acute Rehab OT Goals Patient Stated Goal: to get better OT Goal Formulation: With patient Time For Goal Achievement: 10/12/22 Potential to Achieve Goals: Good  Plan Frequency needs to be updated;Discharge plan remains appropriate    Co-evaluation                 AM-PAC OT "6 Clicks" Daily Activity     Outcome Measure   Help from another person eating meals?: None Help from another person taking care of personal grooming?: A Little Help from another person toileting, which includes using toliet, bedpan, or urinal?: A Little Help from another person bathing (including washing, rinsing, drying)?: A Little Help from another person to put on and taking off regular upper body clothing?: A Little Help from another person to put on and taking off regular lower body clothing?: A Lot 6 Click Score: 18    End of Session Equipment Utilized During Treatment: Rollator (4 wheels)  OT Visit Diagnosis: Unsteadiness on feet (R26.81);Other abnormalities of gait and mobility (R26.89);Muscle weakness (generalized) (M62.81)   Activity Tolerance Patient tolerated treatment well   Patient Left in chair;with call bell/phone within reach   Nurse Communication Mobility status        Time: [id number] OT Time Calculation (min): 21 min  Charges: OT General Charges $OT Visit: 1 Visit OT Treatments $Therapeutic Activity: 8-22 mins  10/09/2022  AB, OTR/L  Acute Rehabilitation Services  Office: [phone]   Cori Razor 10/09/2022, 3:42 PM

## 2022-10-09 NOTE — Progress Notes (Signed)
Mobility Specialist - Progress Note   10/09/22 0900  Mobility  Activity Ambulated with assistance in hallway  Level of Assistance Standby assist, set-up cues, supervision of patient - no hands on  Assistive Device Four wheel walker  Distance Ambulated (ft) 600 ft  Activity Response Tolerated well  Mobility Referral Yes  $Mobility charge 1 Mobility    Pt received in recliner and agreeable. No complaints on walk, took short standing break x1. Left in recliner w/ all needs met and call bell at his side.   [place] Specialist Please contact via SecureChat or Rehab office at [phone]

## 2022-10-09 NOTE — Progress Notes (Signed)
Rounding Note    Patient Name: Ryan Garner Date of Encounter: 10/09/2022  [place] Cardiologist: Dorris Carnes, MD   Subjective   Patient sitting up in a chair at the bedside.  States that his breathing is much better and he feels much better.  He has had an enormous amount of fluid out with 90 pounds of weight loss since he has been hospitalized.  Inpatient Medications    Scheduled Meds:  atorvastatin  80 mg Oral Daily   chlorhexidine  15 mL Mouth/Throat Once   Chlorhexidine Gluconate Cloth  6 each Topical Daily   midodrine  2.5 mg Oral TID WC   potassium chloride  40 mEq Oral BID   senna-docusate  1 tablet Oral Daily   sodium chloride flush  3 mL Intravenous Q12H   spironolactone  12.5 mg Oral Daily   torsemide  80 mg Oral BID   umeclidinium bromide  1 puff Inhalation Daily   Continuous Infusions:  sodium chloride     sodium chloride     heparin 1,300 Units/hr (10/09/22 0634)   magnesium sulfate bolus IVPB     PRN Meds: sodium chloride, acetaminophen, albuterol, guaiFENesin-dextromethorphan, morphine injection, ondansetron (ZOFRAN) IV, oxyCODONE, sodium chloride flush   Vital Signs    Vitals:   10/09/22 0239 10/09/22 0505 10/09/22 0813 10/09/22 1120  BP:  110/75 104/67 107/71  Pulse:  79 92 96  Resp:  18 15 20   Temp:  98 F (36.7 C) 97.7 F (36.5 C)   TempSrc:  Oral Oral Oral  SpO2:  100% 100% 98%  Weight: 98.6 kg     Height:        Intake/Output Summary (Last 24 hours) at 10/09/2022 1211 Last data filed at 10/09/2022 0800 Gross per 24 hour  Intake 862.07 ml  Output 2650 ml  Net -1787.93 ml      10/09/2022    2:39 AM 10/08/2022    6:21 AM 10/07/2022    4:26 AM  Last 3 Weights  Weight (lbs) 217 lb 6 oz 217 lb 13 oz 221 lb 1.6 oz  Weight (kg) 98.6 kg 98.8 kg 100.29 kg       Physical Exam  Alert, oriented, elderly male GEN: No acute distress.   Neck: No JVD Cardiac: RRR, 2/6 systolic murmur at the left lower sternal  border Respiratory: Diminished breath sounds in the bases otherwise clear to auscultation bilaterally. GI: Soft, nontender, non-distended  MS: No edema; No deformity. Neuro:  Nonfocal  Psych: Normal affect   Labs    High Sensitivity Troponin:  No results for input(s): "TROPONINIHS" in the last 720 hours.   Chemistry Recent Labs  Lab 10/07/22 0450 10/08/22 0505 10/09/22 0505  NA 128* 130* 131*  K 3.8 3.4* 3.9  CL 89* 90* 89*  CO2 29 30 30   GLUCOSE 123* 121* 126*  BUN 53* 52* 54*  CREATININE 1.59* 1.51* 1.63*  CALCIUM 9.7 9.8 10.1  MG 2.2 2.0 1.8  GFRNONAA 44* 47* 43*  ANIONGAP 10 10 12     Lipids No results for input(s): "CHOL", "TRIG", "HDL", "LABVLDL", "LDLCALC", "CHOLHDL" in the last 168 hours.  Hematology Recent Labs  Lab 10/06/22 0607 10/08/22 0505 10/09/22 0505  WBC 6.8 7.0 6.9  RBC 3.22* 3.53* 3.62*  HGB 9.7* 10.4* 10.9*  HCT 29.2* 31.6* 31.9*  MCV 90.7 89.5 88.1  MCH 30.1 29.5 30.1  MCHC 33.2 32.9 34.2  RDW 16.7* 16.1* 16.2*  PLT 375 369 363   Thyroid  No results for input(s): "TSH", "FREET4" in the last 168 hours.  BNPNo results for input(s): "BNP", "PROBNP" in the last 168 hours.  DDimer No results for input(s): "DDIMER" in the last 168 hours.   Radiology    No results found.  Cardiac Studies   TEE: 1. Left ventricular ejection fraction, by estimation, is 55%. The left  ventricle has normal function. The left ventricle has no regional wall  motion abnormalities.   2. Mildly D-shaped septum suggestive of RV pressure/volume overload. .  Right ventricular systolic function is moderately reduced. The right  ventricular size is moderately enlarged.   3. Peak RV-RA gradient 24 mmHg.   4. Left atrial size was severely dilated. No left atrial/left atrial  appendage thrombus was detected.   5. Right atrial size was severely dilated.   6. No PFO/ASD by bubble study.   7. There was severe mitral regurgitation and no stenosis. Inadequate  coaptation with  restriction of posterior leaflet, suspect atrial  functional MR. ERO 0.54 cm^2 by PISA, vena contracta area 0.52 cm^2. There  was slight flow reversal in all 4 pulmonary  veins. Mean gradient 3 mmHg.   8. AVA 1.2 cm^2 by planimetry. The aortic valve is tricuspid. There is  moderate calcification of the aortic valve. Aortic valve regurgitation is  not visualized. Moderate aortic valve stenosis. Aortic valve area, by VTI  measures 1.35 cm. Aortic valve  mean gradient measures 15.0 mmHg.   Patient Profile     77 y.o. male acute on chronic heart failure with preserved ejection fraction with prominent right heart component and massive volume overload.  The patient has undergone inotropic support and aggressive IV diuresis.  Found to have severe functional mitral regurgitation and referred for TEER  Assessment & Plan    Mr. Woodhams appears clinically stable with much better volume status after continue diuresis.  He underwent right heart catheterization March 13 after 60 pounds of diuresis but this demonstrated elevated right and left heart diastolic filling pressures.  He has been further diuresed with continued improvement in his volume status.  The patient is clinically stable to move forward with transcatheter edge-to-edge repair of the mitral valve.  He has been covered with IV heparin for atrial fibrillation.  He is noted to have moderate aortic stenosis as well.  After multidisciplinary team review including discussion with advanced heart failure, the patient is felt to be an appropriate candidate for transcatheter edge-to-edge repair of the mitral valve with a Pascal device.  I have reviewed risks, indications, and alternatives with the patient.  He understands that risks include but are not limited to vascular injury, infection, bleeding, cardiac injury, hemopericardium, emergency cardiac surgery, stroke, myocardial infarction, arrhythmia, device embolization, single leaflet device attachment, and  death.  He understands the risks of serious complications occur at a low frequency of approximately 2 to 3%.  He provides full informed consent for the procedure.  I called his son, Marylyn Ishihara, and we discussed our plans for proceeding with mitral valve TEER tomorrow afternoon.  Preoperative orders are written.  For questions or updates, please contact [place] Please consult [email or web address] for contact info under        Signed, [name], MD  10/09/2022, 12:11 PM

## 2022-10-09 NOTE — Progress Notes (Signed)
ANTICOAGULATION CONSULT NOTE - Follow Up Consult  Pharmacy Consult for heparin Indication: atrial fibrillation  Labs: Recent Labs    10/07/22 0450 10/08/22 0505 10/08/22 1729 10/08/22 1850 10/09/22 0505  HGB  --  10.4*  --   --  10.9*  HCT  --  31.6*  --   --  31.9*  PLT  --  369  --   --  363  APTT  --   --  >200* 78* 107*  HEPARINUNFRC  --   --  >1.10* >1.10*  --   CREATININE 1.59* 1.51*  --   --  1.63*    Assessment: 76yo male supratherapeutic on heparin after one PTT at goal; no infusion issues or signs of bleeding per RN.  Goal of Therapy:  aPTT 66-102 seconds   Plan:  Will decrease heparin infusion by 1 unit/kg/hr to 1300 units/hr and check PTT in 8 hours.    Wynona Neat, PharmD, BCPS  10/09/2022,6:28 AM

## 2022-10-09 NOTE — Progress Notes (Addendum)
Patient ID: Ryan Garner, male   DOB: 28-Mar-1946, 77 y.o.   MRN: [id number]     Advanced Heart Failure Rounding Note  PCP-Cardiologist: Dorris Carnes, MD   Subjective:   2/26 acute abdominal pain>>CT A/P shows large left rectus sheath hematoma. Heparin d/ced. Lasix gtt stopped.  2/27 acute HF w/ acute worsening of dyspnea>>120 mg IV Lasix + BiPAP 3/3 Lasix drip increased to 20mg  per hour and given metolazone.  3/17 switched to torsemide.   Denies SOB. No complaints.     RHC (3/13):  Hemodynamics (mmHg) RA mean 17 RV 78/17 PA 78/28, mean 46 PCWP mean 24. V waves were not prominent.  Oxygen saturations: PA 53% AO 94% Cardiac Output (Fick) 5.66  Cardiac Index (Fick) 2.59 PVR 3.9 WU PAPi 2.9   TEE (3/12): LV EF 55%, D-shaped septum, moderate RV dysfunction, severe functional Ryan (suspect atrial functional), mild TR, moderate aortic stenosis.    Objective:   Weight Range: Body mass index is 33.05 kg/m.   Vital Signs:   Temp:  [97.6 F (36.4 C)-98.8 F (37.1 C)] 98 F (36.7 C) (03/19 0505) Pulse Rate:  [76-93] 79 (03/19 0505) Resp:  [16-18] 18 (03/19 0505) BP: (92-119)/(59-78) 110/75 (03/19 0505) SpO2:  [93 %-100 %] 100 % (03/19 0505) Weight:  [98.6 kg] 98.6 kg (03/19 0239) Last BM Date : 10/07/22  Weight change: Filed Weights   10/07/22 0426 10/08/22 0621 10/09/22 0239  Weight: 100.3 kg 98.8 kg 98.6 kg    Intake/Output:   Intake/Output Summary (Last 24 hours) at 10/09/2022 0741 Last data filed at 10/09/2022 0255 Gross per 24 hour  Intake 715.55 ml  Output 2850 ml  Net -2134.45 ml   Physical Exam  General:  Sitting on the side of the bed.  No resp difficulty HEENT: normal Neck: supple. JVP 6-7. Carotids 2+ bilat; no bruits. No lymphadenopathy or thryomegaly appreciated. Cor: PMI nondisplaced. Irregular rate & rhythm. No rubs, gallops. 2/6 Ryan . Lungs: clear Abdomen: soft, nontender, nondistended. No hepatosplenomegaly. No bruits or masses. Good bowel  sounds. Extremities: no cyanosis, clubbing, rash, edema. RUE PICC  Neuro: alert & orientedx3, cranial nerves grossly intact. moves all 4 extremities w/o difficulty. Affect pleasant    Telemetry    A fib 70-90s   Labs  CBC Recent Labs    10/08/22 0505 10/09/22 0505  WBC 7.0 6.9  HGB 10.4* 10.9*  HCT 31.6* 31.9*  MCV 89.5 88.1  PLT 369 [id number]   Basic Metabolic Panel Recent Labs    10/08/22 0505 10/09/22 0505  NA 130* 131*  K 3.4* 3.9  CL 90* 89*  CO2 30 30  GLUCOSE 121* 126*  BUN 52* 54*  CREATININE 1.51* 1.63*  CALCIUM 9.8 10.1  MG 2.0 1.8   BNP: BNP (last 3 results) Recent Labs    11/20/21 1409 04/06/22 1253 09/12/22 2101  BNP 485.0* 682.5* 577.3*   ProBNP (last 3 results) Recent Labs    08/15/22 1049 09/04/22 1404 09/11/22 1530  PROBNP 5,315* 11,525* 8,444*   Imaging   No results found.  Medications:    Scheduled Medications:  atorvastatin  80 mg Oral Daily   chlorhexidine  15 mL Mouth/Throat Once   Chlorhexidine Gluconate Cloth  6 each Topical Daily   midodrine  2.5 mg Oral TID WC   potassium chloride  40 mEq Oral BID   senna-docusate  1 tablet Oral Daily   sodium chloride flush  3 mL Intravenous Q12H   spironolactone  12.5 mg Oral Daily  torsemide  80 mg Oral BID   umeclidinium bromide  1 puff Inhalation Daily   Infusions:  sodium chloride     sodium chloride     heparin 1,300 Units/hr (10/09/22 0634)   PRN Medications: sodium chloride, acetaminophen, albuterol, guaiFENesin-dextromethorphan, morphine injection, ondansetron (ZOFRAN) IV, oxyCODONE, sodium chloride flush  Patient Profile  Ryan Garner is 77 year old with a history of CAD, STEMI 2021, DES to LCx, permanent A fib, severe Ryan, HTN, HLD, sleep apnea, Ryan, chronic lymphedema, and HFpEF.    Admitted with A/C HFpEF.  Assessment/Plan  1. Acute on chronic HFpEF: With prominent RV dysfunction.  Echo this admission shows EF 50-55%, D-shaped septum, moderate-severe RV enlargement with  moderate RV dysfunction, PASP 69, severe biatrial enlargement, moderate AS with mean gradient 21 mmHg/AVA 1.2 cm^2, IVC dilated. Diuretics held 2/26 w/ ABLA from rectus sheath hematoma. Had worsening HF/dyspnea>>120 mg IV Lasix + BiPAP. Lasix infusion started 2/28.  Initially on dobutamine for RV support, now off.  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  CI 2.6, PAPi adequate at 2.9.  -CO-OX stable. Volume status stable.  Overall down 90 pounds - Continue po torsemide 80 bid (was on lasix 120 bid PTA) - Continue spironolactone 12.5 daily.  - On midodrine 2.5 tid.  2. Mitral regurgitation: Severe Ryan by echo.  TEE on 3/12 showed LV EF 55%, D-shaped septum, moderate RV dysfunction, severe functional Ryan (suspect atrial functional, also significant posterior leaflet restriction), mild TR, moderate aortic stenosis.  - mTEER tentatively planned for Wednesday 3/20.   -  RHC reviewed filling pressures up with mixed pre & post capillary components.  3. AKI on CKD 3: Suspect cardiorenal in etiology.  -Scr up a little 1.6  - Daily BMET 4. Atrial fibrillation: Permanent, has been seen by EP in the past. Not likely to successfully cardiovert and stay in NSR.  Eliquis restarted on 3/2 (held prior for rectus sheath hematoma from heparin).   Remains in A fib with controlled rate.  - Continue Heparin drip.   5. CAD:   Had DES to mLCx in 10/21.  No s/s angina - Continue atorvastatin - No ASA with Eliquis use.  6. LE wounds: Followed as outpatient by Dr. Sharol Given.  ABIs normal this admission.  - Wound care - Improved 7. Left Rectus Sheath Hematoma: 15.0 x 7.4 x 14.7 cm hematoma on CT 2/26.  - Resolved 8. Anemia, acute blood loss: in setting of rectus sheath hematoma. Now stable.  - hgb stable, 10.9  9. Aortic stenosis: Moderate on TEE.  10. Dispo - Pt no longer candidate for CIR as he has significantly improved.  Plan for [facility]OT. May need to reevaluate post mTEER. 11. Hypomag - Supp Mag    MTEER 3/20  Amy Clegg NP-C  10/09/2022 7:41 AM  Patient seen and examined with the above-signed Advanced Practice Provider and/or Housestaff. I personally reviewed laboratory data, imaging studies and relevant notes. I independently examined the patient and formulated the important aspects of the plan. I have edited the note to reflect any of my changes or salient points. I have personally discussed the plan with the patient and/or family.  Off inotropes. Volume status stable on po diuretics. On IV heparin.  No bleeding. (Had recent rectus sheath hematoma on IV lasix).   General: Sitting in chair . No resp difficulty HEENT: normal Neck: supple. no JVD. Carotids 2+ bilat; no bruits. No  lymphadenopathy or thryomegaly appreciated. Cor: PMI nondisplaced. Irregular rate & rhythm. 3/6 Ryan Lungs: clear Abdomen: obese soft, nontender, nondistended. No hepatosplenomegaly. No bruits or masses. Good bowel sounds. Extremities: no cyanosis, clubbing, rash, edema Neuro: alert & orientedx3, cranial nerves grossly intact. moves all 4 extremities w/o difficulty. Affect pleasant  Remains stable on current therapy. Continue Heparin. Plan mTEER tomorrow.   Glori Bickers, MD  10:26 PM

## 2022-10-10 ENCOUNTER — Inpatient Hospital Stay (HOSPITAL_COMMUNITY): Payer: Medicare Other

## 2022-10-10 ENCOUNTER — Other Ambulatory Visit (HOSPITAL_COMMUNITY): Payer: Medicare Other

## 2022-10-10 ENCOUNTER — Inpatient Hospital Stay (HOSPITAL_COMMUNITY): Payer: Medicare Other | Admitting: Certified Registered Nurse Anesthetist

## 2022-10-10 ENCOUNTER — Encounter (HOSPITAL_COMMUNITY)
Admission: RE | Disposition: A | Discharge status: Home / Self Care | Payer: Self-pay | Source: Ambulatory Visit | Attending: Internal Medicine

## 2022-10-10 DIAGNOSIS — I252 Old myocardial infarction: Secondary | ICD-10-CM | POA: Diagnosis not present

## 2022-10-10 DIAGNOSIS — Z006 Encounter for examination for normal comparison and control in clinical research program: Secondary | ICD-10-CM

## 2022-10-10 DIAGNOSIS — Z955 Presence of coronary angioplasty implant and graft: Secondary | ICD-10-CM

## 2022-10-10 DIAGNOSIS — Z87891 Personal history of nicotine dependence: Secondary | ICD-10-CM

## 2022-10-10 DIAGNOSIS — I340 Nonrheumatic mitral (valve) insufficiency: Secondary | ICD-10-CM

## 2022-10-10 DIAGNOSIS — I2510 Atherosclerotic heart disease of native coronary artery without angina pectoris: Secondary | ICD-10-CM

## 2022-10-10 DIAGNOSIS — I110 Hypertensive heart disease with heart failure: Secondary | ICD-10-CM | POA: Diagnosis not present

## 2022-10-10 DIAGNOSIS — I509 Heart failure, unspecified: Secondary | ICD-10-CM

## 2022-10-10 DIAGNOSIS — Z9889 Other specified postprocedural states: Secondary | ICD-10-CM

## 2022-10-10 DIAGNOSIS — E1149 Type 2 diabetes mellitus with other diabetic neurological complication: Secondary | ICD-10-CM

## 2022-10-10 HISTORY — DX: Other specified postprocedural states: Z98.890

## 2022-10-10 HISTORY — PX: TEE WITHOUT CARDIOVERSION: SHX5443

## 2022-10-10 HISTORY — PX: TRANSCATHETER MITRAL EDGE TO EDGE REPAIR: CATH118311

## 2022-10-10 LAB — CBC
HCT: 30.8 % — ABNORMAL LOW (ref 39.0–52.0)
Hemoglobin: 10.2 g/dL — ABNORMAL LOW (ref 13.0–17.0)
MCH: 29.8 pg (ref 26.0–34.0)
MCHC: 33.1 g/dL (ref 30.0–36.0)
MCV: 90.1 fL (ref 80.0–100.0)
Platelets: 338 10*3/uL (ref 150–400)
RBC: 3.42 MIL/uL — ABNORMAL LOW (ref 4.22–5.81)
RDW: 16 % — ABNORMAL HIGH (ref 11.5–15.5)
WBC: 7.2 10*3/uL (ref 4.0–10.5)
nRBC: 0 % (ref 0.0–0.2)

## 2022-10-10 LAB — POCT ACTIVATED CLOTTING TIME
Activated Clotting Time: 282 seconds
Activated Clotting Time: 336 seconds
Activated Clotting Time: 266 seconds
Activated Clotting Time: 277 seconds
Activated Clotting Time: 293 seconds

## 2022-10-10 LAB — APTT: aPTT: 119 seconds — ABNORMAL HIGH (ref 24–36)

## 2022-10-10 LAB — BASIC METABOLIC PANEL
Anion gap: 11 (ref 5–15)
BUN: 55 mg/dL — ABNORMAL HIGH (ref 8–23)
CO2: 28 mmol/L (ref 22–32)
Calcium: 9.7 mg/dL (ref 8.9–10.3)
Chloride: 90 mmol/L — ABNORMAL LOW (ref 98–111)
Creatinine, Ser: 1.52 mg/dL — ABNORMAL HIGH (ref 0.61–1.24)
GFR, Estimated: 47 mL/min — ABNORMAL LOW (ref >60)
Glucose, Bld: 127 mg/dL — ABNORMAL HIGH (ref 70–99)
Potassium: 4 mmol/L (ref 3.5–5.1)
Sodium: 129 mmol/L — ABNORMAL LOW (ref 135–145)

## 2022-10-10 LAB — ECHO TEE
AR max vel: 2.58 cm2
AV Area VTI: 2.8 cm2
AV Area mean vel: 2.26 cm2
AV Mean grad: 10 mmHg
AV Peak grad: 16.2 mmHg
Ao pk vel: 2.02 m/s
MV M vel: 4.49 m/s
MV Peak grad: 80.6 mmHg
MV VTI: 4.32 cm2
Radius: 1.05 cm

## 2022-10-10 LAB — TYPE AND SCREEN
ABO/RH(D): B POS
Antibody Screen: NEGATIVE

## 2022-10-10 LAB — HEMOGLOBIN A1C
Hgb A1c MFr Bld: 6.4 % — ABNORMAL HIGH (ref 4.8–5.6)
Mean Plasma Glucose: 137 mg/dL

## 2022-10-10 LAB — MAGNESIUM: Magnesium: 2 mg/dL (ref 1.7–2.4)

## 2022-10-10 LAB — COOXEMETRY PANEL
Carboxyhemoglobin: 3.1 % — ABNORMAL HIGH (ref 0.5–1.5)
Methemoglobin: <0.7 % (ref 0.0–1.5)
O2 Saturation: 70.2 %
Total hemoglobin: 10.5 g/dL — ABNORMAL LOW (ref 12.0–16.0)

## 2022-10-10 LAB — HEPARIN LEVEL (UNFRACTIONATED): Heparin Unfractionated: >1.1 IU/mL — ABNORMAL HIGH (ref 0.30–0.70)

## 2022-10-10 LAB — SARS CORONAVIRUS 2 BY RT PCR: SARS Coronavirus 2 by RT PCR: NEGATIVE

## 2022-10-10 SURGERY — MITRAL VALVE REPAIR
Anesthesia: General

## 2022-10-10 MED ORDER — SUGAMMADEX SODIUM 200 MG/2ML IV SOLN
INTRAVENOUS | Status: DC | PRN
  Administered 2022-10-10 (×2): 100 mg via INTRAVENOUS

## 2022-10-10 MED ORDER — SODIUM CHLORIDE 0.9% FLUSH
3.0000 mL | Freq: Two times a day (BID) | INTRAVENOUS | Status: DC
  Administered 2022-10-10 – 2022-10-11 (×2): 3 mL via INTRAVENOUS

## 2022-10-10 MED ORDER — SODIUM CHLORIDE 0.9 % IV SOLN: INTRAVENOUS | Status: DC

## 2022-10-10 MED ORDER — ROCURONIUM BROMIDE 10 MG/ML (PF) SYRINGE
PREFILLED_SYRINGE | INTRAVENOUS | Status: DC | PRN
  Administered 2022-10-10 (×3): 20 mg via INTRAVENOUS
  Administered 2022-10-10: 60 mg via INTRAVENOUS

## 2022-10-10 MED ORDER — APIXABAN 5 MG PO TABS
5.0000 mg | ORAL_TABLET | Freq: Two times a day (BID) | ORAL | Status: DC
  Administered 2022-10-11: 5 mg via ORAL
  Filled 2022-10-10: qty 1

## 2022-10-10 MED ORDER — PHENYLEPHRINE 80 MCG/ML (10ML) SYRINGE FOR IV PUSH (FOR BLOOD PRESSURE SUPPORT)
PREFILLED_SYRINGE | INTRAVENOUS | Status: DC | PRN
  Administered 2022-10-10 (×5): 160 ug via INTRAVENOUS

## 2022-10-10 MED ORDER — LACTATED RINGERS IV SOLN: INTRAVENOUS | Status: DC

## 2022-10-10 MED ORDER — SODIUM CHLORIDE 0.9% FLUSH: 3.0000 mL | INTRAVENOUS | Status: DC | PRN

## 2022-10-10 MED ORDER — PROTAMINE SULFATE 10 MG/ML IV SOLN
INTRAVENOUS | Status: DC | PRN
  Administered 2022-10-10: 30 mg via INTRAVENOUS

## 2022-10-10 MED ORDER — PHENYLEPHRINE HCL-NACL 20-0.9 MG/250ML-% IV SOLN
INTRAVENOUS | Status: DC | PRN
  Administered 2022-10-10: 25 ug/min via INTRAVENOUS

## 2022-10-10 MED ORDER — HEPARIN (PORCINE) IN NACL 2000-0.9 UNIT/L-% IV SOLN
INTRAVENOUS | Status: DC | PRN
  Administered 2022-10-10: 1000 mL

## 2022-10-10 MED ORDER — FUROSEMIDE 10 MG/ML IJ SOLN
INTRAMUSCULAR | Status: DC | PRN
  Administered 2022-10-10: 80 mg via INTRAMUSCULAR

## 2022-10-10 MED ORDER — DEXAMETHASONE SODIUM PHOSPHATE 10 MG/ML IJ SOLN
INTRAMUSCULAR | Status: DC | PRN
  Administered 2022-10-10: 10 mg via INTRAVENOUS

## 2022-10-10 MED ORDER — SODIUM CHLORIDE 0.9 % IV SOLN: 250.0000 mL | INTRAVENOUS | Status: DC | PRN

## 2022-10-10 MED ORDER — CEFAZOLIN SODIUM-DEXTROSE 2-4 GM/100ML-% IV SOLN
2.0000 g | INTRAVENOUS | Status: AC
  Administered 2022-10-10: 2 g via INTRAVENOUS
  Filled 2022-10-10: qty 100

## 2022-10-10 MED ORDER — LACTATED RINGERS IV SOLN: INTRAVENOUS | Status: DC | PRN

## 2022-10-10 MED ORDER — HEPARIN (PORCINE) IN NACL 1000-0.9 UT/500ML-% IV SOLN
INTRAVENOUS | Status: DC | PRN
  Administered 2022-10-10: 500 mL

## 2022-10-10 MED ORDER — HEPARIN SODIUM (PORCINE) 1000 UNIT/ML IJ SOLN
INTRAMUSCULAR | Status: DC | PRN
  Administered 2022-10-10: 1000 [IU] via INTRAVENOUS
  Administered 2022-10-10: 12000 [IU] via INTRAVENOUS
  Administered 2022-10-10 (×2): 3000 [IU] via INTRAVENOUS

## 2022-10-10 MED ORDER — PROPOFOL 10 MG/ML IV BOLUS
INTRAVENOUS | Status: DC | PRN
  Administered 2022-10-10: 110 mg via INTRAVENOUS

## 2022-10-10 MED ORDER — ONDANSETRON HCL 4 MG/2ML IJ SOLN
INTRAMUSCULAR | Status: DC | PRN
  Administered 2022-10-10: 4 mg via INTRAVENOUS

## 2022-10-10 MED ORDER — FENTANYL CITRATE (PF) 250 MCG/5ML IJ SOLN
INTRAMUSCULAR | Status: DC | PRN
  Administered 2022-10-10 (×2): 50 ug via INTRAVENOUS

## 2022-10-10 MED ORDER — FUROSEMIDE 10 MG/ML IJ SOLN
INTRAMUSCULAR | Status: AC
  Filled 2022-10-10: qty 8

## 2022-10-10 SURGICAL SUPPLY — 23 items
CLOSURE PERCLOSE PROSTYLE (VASCULAR PRODUCTS) IMPLANT
KIT MICROPUNCTURE NIT STIFF (SHEATH) IMPLANT
KIT VERSACROSS LRG ACCESS (CATHETERS) IMPLANT
PACK CARDIAC CATHETERIZATION (CUSTOM PROCEDURE TRAY) ×1 IMPLANT
PROTECTION STATION PRESSURIZED (MISCELLANEOUS) ×1
RAIL STABILIZER SYS PASCAL (SYSTAGENIX WOUND MANAGEMENT) IMPLANT
SHEATH DILAT COONS TAPER 22F (SHEATH) IMPLANT
SHEATH DRYSEAL FLEX 24FR 33CM (SHEATH) IMPLANT
SHEATH GLIDE SLENDER 4/5FR (SHEATH) IMPLANT
SHEATH PASCAL GUIDE 22F (SHEATH) IMPLANT
SHEATH PINNACLE 8F 10CM (SHEATH) IMPLANT
SHEATH PROBE COVER 6X72 (BAG) IMPLANT
STATION PROTECTION PRESSURIZED (MISCELLANEOUS) IMPLANT
STOPCOCK MORSE 400PSI 3WAY (MISCELLANEOUS) ×6 IMPLANT
SYST IMPL PASCAL ACE PRECISION (Prosthesis & Implant Heart) ×2 IMPLANT
SYSTEM IMPL PASCAL PRECISION (Prosthesis & Implant Heart) IMPLANT
SYSTEM IMPL PSCL ACE PRECISION (Prosthesis & Implant Heart) IMPLANT
SYSTEM PASCAL MITRAL REPAIR (SYSTAGENIX WOUND MANAGEMENT) IMPLANT
TRANSDUCER W/STOPCOCK (MISCELLANEOUS) ×1 IMPLANT
TUBING ART PRESS 72  MALE/FEM (TUBING) ×1
TUBING ART PRESS 72 MALE/FEM (TUBING) ×1 IMPLANT
WIRE EMERALD 3MM-J .035X150CM (WIRE) IMPLANT
WIRE MICRO SET SILHO 5FR 7 (SHEATH) IMPLANT

## 2022-10-10 NOTE — Transfer of Care (Signed)
Immediate Anesthesia Transfer of Care Note  Patient: [name]  Procedure(s) Performed: MITRAL VALVE REPAIR TRANSESOPHAGEAL ECHOCARDIOGRAM  Patient Location: Cath Lab  Anesthesia Type:General  Level of Consciousness: awake and patient cooperative  Airway & Oxygen Therapy: Patient Spontanous Breathing and Patient connected to nasal cannula oxygen  Post-op Assessment: Report given to RN and Post -op Vital signs reviewed and stable  Post vital signs: Reviewed and stable  Last Vitals:  Vitals Value Taken Time  BP 133/82 10/10/22 1815  Temp 36.8 C 10/10/22 1813  Pulse 83 10/10/22 1816  Resp 28 10/10/22 1816  SpO2 98 % 10/10/22 1816  Vitals shown include unvalidated device data.  Last Pain:  Vitals:   10/10/22 1813  TempSrc: Temporal  PainSc: 0-No pain      Patients Stated Pain Goal: 0 ([id number])  Complications: There were no known notable events for this encounter.

## 2022-10-10 NOTE — Anesthesia Preprocedure Evaluation (Signed)
Anesthesia Evaluation  Patient identified by MRN, date of birth, ID band Patient awake    Reviewed: Allergy & Precautions, NPO status , Patient's Chart, lab work & pertinent test results  History of Anesthesia Complications Negative for: history of anesthetic complications  Airway Mallampati: III  TM Distance: >3 FB Neck ROM: Full    Dental  (+) Teeth Intact, Dental Advisory Given   Pulmonary shortness of breath, sleep apnea , COPD, former smoker   breath sounds clear to auscultation       Cardiovascular hypertension, Pt. on medications + CAD, + Past MI, + Cardiac Stents and +CHF   Rhythm:Regular   1. Left ventricular ejection fraction, by estimation, is 55%. The left  ventricle has normal function. The left ventricle has no regional wall  motion abnormalities.   2. Mildly D-shaped septum suggestive of RV pressure/volume overload. .  Right ventricular systolic function is moderately reduced. The right  ventricular size is moderately enlarged.   3. Peak RV-RA gradient 24 mmHg.   4. Left atrial size was severely dilated. No left atrial/left atrial  appendage thrombus was detected.   5. Right atrial size was severely dilated.   6. No PFO/ASD by bubble study.   7. There was severe mitral regurgitation and no stenosis. Inadequate  coaptation with restriction of posterior leaflet, suspect atrial  functional MR. ERO 0.54 cm^2 by PISA, vena contracta area 0.52 cm^2. There  was slight flow reversal in all 4 pulmonary  veins. Mean gradient 3 mmHg.   8. AVA 1.2 cm^2 by planimetry. The aortic valve is tricuspid. There is  moderate calcification of the aortic valve. Aortic valve regurgitation is  not visualized. Moderate aortic valve stenosis. Aortic valve area, by VTI  measures 1.35 cm. Aortic valve  mean gradient measures 15.0 mmHg.     Ramus lesion is 75% stenosed.  Mid Cx lesion is 99% stenosed.  Post intervention, there is a  0% residual stenosis.  A drug-eluting stent was successfully placed using a STENT RESOLUTE ONYX 2.5X34.  The left ventricular systolic function is normal.  LV end diastolic pressure is mildly elevated.  The left ventricular ejection fraction is 55-65% by visual estimate.   1. 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 Good LV systolic function 3. Elevated LVEDP 4. Successful PCI of the mid to distal LCx with DES x 1.   Plan: ASA 81 mg daily for one month. Plavix 75 mg daily for one year. May resume Eliquis tomorrow. IV diuresis. Will need to stop Flecainide. Consider alternative AAD therapy.     Neuro/Psych  Neuromuscular disease    GI/Hepatic negative GI ROS, Neg liver ROS,,,  Endo/Other  diabetes    Renal/GU Renal diseaseLab Results      Component                Value               Date                      CREATININE               1.52 (H)            10/10/2022                Musculoskeletal  (+) Arthritis ,    Abdominal   Peds  Hematology  (+) Blood dyscrasia, anemia Lab Results      Component  Value               Date                      WBC                      7.2                 10/10/2022                HGB                      10.2 (L)            10/10/2022                HCT                      30.8 (L)            10/10/2022                MCV                      90.1                10/10/2022                PLT                      338                 10/10/2022             eliquis   Anesthesia Other Findings hoarsness  Reproductive/Obstetrics                             Anesthesia Physical Anesthesia Plan  ASA: 4  Anesthesia Plan: General   Post-op Pain Management: Minimal or no pain anticipated   Induction: Intravenous  PONV Risk Score and Plan: 2 and Ondansetron and Dexamethasone  Airway Management Planned: Oral ETT  Additional Equipment: Arterial line and TEE  Intra-op  Plan:   Post-operative Plan: Extubation in OR and Possible Post-op intubation/ventilation  Informed Consent: I have reviewed the patients History and Physical, chart, labs and discussed the procedure including the risks, benefits and alternatives for the proposed anesthesia with the patient or authorized representative who has indicated his/her understanding and acceptance.   Patient has DNR.  Discussed DNR with patient and Suspend DNR.   Dental advisory given  Plan Discussed with: CRNA  Anesthesia Plan Comments:        Anesthesia Quick Evaluation

## 2022-10-10 NOTE — Op Note (Addendum)
    PROCEDURE:  Transcatheter edge to edge mitral valve repair (TEER) INDICATION: Severe symptomatic mitral regurgitation (Stage D)  SURGEON:  [name], MD CO-SURGEON:  Lenna Sciara, MD  PROCEDURAL DETAILS: General anesthesia is induced.  The patient is prepped and draped.  Baseline transesophageal echo images are obtained and confirm appropriate anatomy for transcatheter edge-to-edge mitral valve repair.  Using vascular ultrasound guidance, the right common femoral vein is accessed via a front wall puncture.  2 Perclose sutures are deployed and an 8 [place] sheath is inserted.  A versa cross wire is advanced into the SVC.  Heparin is administered and a therapeutic ACT is achieved.  Transseptal puncture is performed over the mid posterior portion of the fossa.  The septum is dilated while the steerable guide catheter is prepped.  After progressively dilating the right common femoral vein, the 37 [place] guide catheter is inserted and advanced across the interatrial septum.  The dilator and the versa cross wire were carefully removed and the guide tip is appropriately positioned approximately 2 cm across the interatrial septum.  A PASCAL ACE device is prepped per protocol.  The implant system consisting of the PASCAL device, steerable catheter, and implant catheters is inserted through the guide catheter.  The PASCAL ACE device is positioned above the mitral valve after applying appropriate manuevers to the guide and steerable catheters.  The PASCAL ACE device is then placed in the capture ready position and oriented coaxially with the anterior and posterior leaflets of the mitral valve.  Low tidal volume ventilation is initiated.  The clip device is advanced across the mitral valve and pulled back until capture of both the anterior and posterior leaflets is achieved.  Clasps are dropped and the device is closed under TEE guidance.  Careful TEE assessment is performed and reduction in mitral valve  regurgitation is felt to be appropriate.  Leaflet insertion is verified using 3D imaging.  The PASCAL ACE device is deployed using normal technique.  After full TEE assessment, the procedural result is felt to be adequate with reduction of mitral regurgitation to 3+.   Second PASCAL ACE Given residual mitral regurgitation medial to the first device, second PASCAL ACE device was prepared and placed in this medial position.  Resultant mitral regurgitation was 1+ with normalization of pulmonary vein flow.  The mean transmitral gradient was 6 mmHg.  PROCEDURE COMPLETION: The guide catheter is pulled back into the right atrium and the interatrial septum is assessed with TEE.  There is no significant septal injury seen and no right to left shunting.  The guide catheter is removed and the Perclose sutures are tightened.  Protamine is administered.  CONCLUSION: Successful transcatheter edge-to-edge mitral valve repair under fluoroscopic and echo guidance, reducing baseline 4+ mitral regurgitation to 1+ with 2 PASCAL ACE clips positioned on medial aspect A2/P2 and on A3/P3; residual transmitral gradient of 6 mmHg.  Early Osmond 10/10/2022 7:12 PM

## 2022-10-10 NOTE — Progress Notes (Signed)
ANTICOAGULATION CONSULT NOTE - Follow Up Consult  Pharmacy Consult for heparin Indication: atrial fibrillation  Labs: Recent Labs    10/08/22 0505 10/08/22 1729 10/08/22 1850 10/09/22 0505 10/09/22 1430 10/09/22 1538 10/10/22 0446  HGB 10.4*  --   --  10.9*  --   --  10.2*  HCT 31.6*  --   --  31.9*  --   --  30.8*  PLT 369  --   --  363  --   --  338  APTT  --    < > 78* 107* 103* 94* 119*  HEPARINUNFRC  --    < > >1.10* >1.10*  --   --  >1.10*  CREATININE 1.51*  --   --  1.63*  --   --  1.52*   < > = values in this interval not displayed.     Assessment: 77yo male supratherapeutic on heparin after one PTT at goal; no infusion issues or signs of bleeding per RN.  Goal of Therapy:  aPTT 66-102 seconds   Plan:  Will decrease heparin infusion by 1 unit/kg/hr to 1200 units/hr and check PTT in 8 hours.    Wynona Neat, PharmD, BCPS  10/10/2022,5:56 AM

## 2022-10-10 NOTE — Anesthesia Procedure Notes (Signed)
Arterial Line Insertion Start/End3/20/2024 1:00 PM, 10/10/2022 1:10 AM Performed by: Griffin Dakin, CRNA  Patient location: Pre-op. Preanesthetic checklist: patient identified, IV checked, site marked, risks and benefits discussed, surgical consent, monitors and equipment checked, pre-op evaluation, timeout performed and anesthesia consent Lidocaine 1% used for infiltration Left, radial was placed Catheter size: 20 G Hand hygiene performed  and maximum sterile barriers used   Attempts: 1 Procedure performed without using ultrasound guided technique. Following insertion, dressing applied and Biopatch. Post procedure assessment: normal and unchanged  Patient tolerated the procedure well with no immediate complications.

## 2022-10-10 NOTE — Discharge Summary (Signed)
Advanced Heart Failure Team  Discharge Summary   Patient ID: Ryan Garner MRN: [id number], DOB/AGE: [birth year] 77 y.o. Admit date: 09/12/2022 D/C date:     10/11/2022   Primary Discharge Diagnoses:  1. Acute on chronic HFpEF: With prominent RV dysfunction.   2. Mitral regurgitation: Severe MR by echo.  S/PP mTEER 3. AKI on CKD 3 4. Atrial fibrillation: Permanent, 5. CAD:   Had DES to mLCx in 10/21. 6. LE wounds 7. Left Rectus Sheath Hematoma 8. Anemia, acute blood loss: in setting of rectus sheath hematoma. 9. Aortic stenosis 10. Hypomagnesia   Hospital Course:   Mr Rabe is 77 year old with a history of CAD, STEMI 2021, DES to LCx, permanent A fib, severe MR, HTN, HLD, sleep apnea, chronic lymphedema, and HFpEF.    Admitted with A/C HFpEF . Echo showed EF 50-55%, D-shaped septum, moderate-severe RV enlargement with moderate RV dysfunction, PASP 69, severe biatrial enlargement, moderate AS with mean gradient 21 mmHg/AVA 1.2 cm^2, IVC dilated. Diuresed with IV lasix with poor response, hypotension,  and worsening renal function. Dobutamine added for RV support. Diuresis improved. Overall diuresed > 90 pounds. Transitioned to torsemide 80 mg twice a day.   Developed acute abdominal pain. Sent for stat CT and this showed rectus sheath hematoma. Anticoagulants held. Gradual improvement. Anticoagulants restarted. Hemoglobin stable   TEE on 3/12 showed LV EF 55%, D-shaped septum, moderate RV dysfunction, severe functional MR (suspect atrial functional, also significant posterior leaflet restriction), mild TR, moderate aortic stenosis. Structural heart team consulted. On 10/10/22 he underwent successful mTEER with residual 1+ MR. Plan to continue midodrine for now and consider discontinuing at his follow up.   He will continue to be followed closely in the HF clinic. [facility] set up for RN, PT, OT services. Referred to HF Paramedicine. Please see below for detailed problem list.   1. Acute on  chronic HFpEF: With prominent RV dysfunction.  Echo this admission shows EF 50-55%, D-shaped septum, moderate-severe RV enlargement with moderate RV dysfunction, PASP 69, severe biatrial enlargement, moderate AS with mean gradient 21 mmHg/AVA 1.2 cm^2, IVC dilated. Diuretics held 2/26 w/ ABLA from rectus sheath hematoma. Had worsening HF/dyspnea>>120 mg IV Lasix + BiPAP. Lasix infusion started 2/28.  Initially on dobutamine for RV support, now off.  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  CI 2.6, PAPi adequate at 2.9.  - CO-OX stable. Volume status stable.  Overall down 90 pounds  - Continue po torsemide 80 bid.  - Continue spironolactone 12.5 daily.  - On midodrine 2.5 tid, goal to d/c however BP still in 90s today.  2. Mitral regurgitation: Severe MR by echo.  TEE on 3/12 showed LV EF 55%, D-shaped septum, moderate RV dysfunction, severe functional MR (suspect atrial functional, also significant posterior leaflet restriction), mild TR, moderate aortic stenosis.  - s/p mTEER 3/20. 2 clips successfully placed MR +4>MR +1 -  RHC reviewed filling pressures up with mixed pre & post capillary components.  3. AKI on CKD 3: Suspect cardiorenal in etiology.  - Scr 1.7 - Repeat BMET at his follow up.  4. Atrial fibrillation: Permanent, has been seen by EP in the past. Not likely to successfully cardiovert and stay in NSR.  Eliquis restarted on 3/2 (held prior for rectus sheath hematoma from heparin).   Remains in A fib with controlled rate.  - Stable on eliquis.  5. CAD:   Had DES to mLCx in 10/21.  No s/s angina - Continue atorvastatin - No ASA with Eliquis use.  6. LE wounds: Followed as outpatient by Dr. Sharol Given.  ABIs normal this admission.  - Improved 7. Left Rectus Sheath Hematoma: 15.0 x 7.4 x 14.7 cm hematoma on CT 2/26.  Resolved 8. Anemia, acute blood loss: in setting of rectus sheath hematoma. Now stable.  - hgb  stable, 9.9 9. Aortic stenosis: Moderate on TEE.  10. Hypomag  Discharge Vitals: Blood pressure 102/68, pulse 95, temperature 97.7 F (36.5 C), temperature source Oral, resp. rate 20, height 5\' 8"  (1.727 m), weight 102.4 kg, SpO2 97 %.  Labs: Lab Results  Component Value Date   WBC 8.3 10/11/2022   HGB 9.9 (L) 10/11/2022   HCT 30.2 (L) 10/11/2022   MCV 91.2 10/11/2022   PLT 284 10/11/2022    Recent Labs  Lab 10/11/22 0400  NA 131*  K 4.3  CL 91*  CO2 26  BUN 55*  CREATININE 1.77*  CALCIUM 9.6  GLUCOSE 166*   Lab Results  Component Value Date   CHOL 92 (L) 12/15/2021   HDL 35 (L) 12/15/2021   LDLCALC 43 12/15/2021   TRIG 60 12/15/2021   BNP (last 3 results) Recent Labs    11/20/21 1409 04/06/22 1253 09/12/22 2101  BNP 485.0* 682.5* 577.3*    ProBNP (last 3 results) Recent Labs    08/15/22 1049 09/04/22 1404 09/11/22 1530  PROBNP 5,315* 11,525* 8,444*     Diagnostic Studies/Procedures   TEE 10/10/22  Left Ventricle: Left ventricular ejection fraction, by estimation, is 50  to 55%. The left ventricle has low normal function. The left ventricle has  no regional wall motion abnormalities. The left ventricular internal  cavity size was normal in size.  Right Ventricle: The right ventricular size is moderately enlarged. No  increase in right ventricular wall thickness. Right ventricular systolic  function is severely reduced.  Left Atrium: Left atrial size was severely dilated. No left atrial/left  atrial appendage thrombus was detected.  Right Atrium: Right atrial size was moderately dilated. Prominent  Eustachian valve.  Pericardium: There is no evidence of pericardial effusion.  Mitral Valve: Cannot exclude a tiny ruptured chord attached to the middle  portion of the anterior leaflet, but the major cause of mitral  insufficiency appears to be annular dilation and malcoaptation ("atrial  functional MR"). The mitral valve is abnormal. Severe mitral  valve regurgitation, with eccentric posteriorly directed jet. MV peak gradient, 8.2 mmHg. The mean mitral valve gradient is 4.2 mmHg.  Tricuspid Valve: The tricuspid valve is normal in structure. Tricuspid  valve regurgitation is mild.  Aortic Valve: The aortic valve is tricuspid. There is moderate  calcification of the aortic valve. There is moderate thickening of the  aortic valve. Aortic valve regurgitation is not visualized. Mild aortic  stenosis is present. Aortic valve mean gradient  measures 10.0 mmHg. Aortic valve peak gradient measures 16.2 mmHg. Aortic valve area, by VTI measures 2.80 cm.  Pulmonic Valve: The pulmonic valve was normal in structure. Pulmonic valve regurgitation is trivial.  Aorta: The aortic root, ascending aorta, aortic arch and descending aorta  are all structurally normal, with no evidence of dilitation or  obstruction.   RHC 10/03/22 RA mean 17 RV 78/17 PA 78/28, mean 46 PCWP mean 24. V waves were not prominent.  Oxygen saturations: PA 53% AO 94% Cardiac Output (Fick) 5.66  Cardiac Index (Fick) 2.59 PVR 3.9 WU PAPi 2.9   TEE 10/02/2022   1. Left ventricular  ejection fraction, by estimation, is 55%. The left  ventricle has normal function. The left ventricle has no regional wall  motion abnormalities.   2. Mildly D-shaped septum suggestive of RV pressure/volume overload. .  Right ventricular systolic function is moderately reduced. The right  ventricular size is moderately enlarged.   3. Peak RV-RA gradient 24 mmHg.   4. Left atrial size was severely dilated. No left atrial/left atrial  appendage thrombus was detected.   5. Right atrial size was severely dilated.   6. No PFO/ASD by bubble study.   7. There was severe mitral regurgitation and no stenosis. Inadequate  coaptation with restriction of posterior leaflet, suspect atrial  functional MR. ERO 0.54 cm^2 by PISA, vena contracta area 0.52 cm^2. There  was slight flow reversal in all 4 pulmonary   veins. Mean gradient 3 mmHg.   8. AVA 1.2 cm^2 by planimetry. The aortic valve is tricuspid. There is  moderate calcification of the aortic valve. Aortic valve regurgitation is  not visualized. Moderate aortic valve stenosis. Aortic valve area, by VTI  measures 1.35 cm. Aortic valve  mean gradient measures 15.0 mmHg   Echo 09/14/2022 - EF 50-55%, D-shaped septum, moderate-severe RV enlargement with moderate RV dysfunction, PASP 69, severe biatrial enlargement, moderate AS with mean gradient 21 mmHg/AVA 1.2 cm^2, IVC dilated.  Discharge Medications   Allergies as of 10/11/2022       Reactions   Flecainide    Per patient dizziness, shortness of breath, blurred vision   Metoprolol Tartrate Itching   Rofecoxib Rash, Other (See Comments)   (Vioxx)        Medication List     STOP taking these medications    clopidogrel 75 MG tablet Commonly known as: PLAVIX   cyanocobalamin 1000 MCG tablet Commonly known as: VITAMIN B12   fluconazole 150 MG tablet Commonly known as: Diflucan   furosemide 80 MG tablet Commonly known as: LASIX   metoprolol tartrate 25 MG tablet Commonly known as: LOPRESSOR   sulfamethoxazole-trimethoprim 800-160 MG tablet Commonly known as: BACTRIM DS       TAKE these medications    albuterol 108 (90 Base) MCG/ACT inhaler Commonly known as: VENTOLIN HFA Inhale 2 puffs into the lungs every 6 (six) hours as needed for wheezing or shortness of breath.   apixaban 5 MG Tabs tablet Commonly known as: ELIQUIS Take 1 tablet (5 mg total) by mouth 2 (two) times daily.   ascorbic acid 1000 MG tablet Commonly known as: VITAMIN C Take 1 tablet (1,000 mg total) by mouth daily.   atorvastatin 80 MG tablet Commonly known as: LIPITOR Take 1 tablet by mouth once daily   midodrine 2.5 MG tablet Commonly known as: PROAMATINE Take 1 tablet (2.5 mg total) by mouth 3 (three) times daily with meals.   multivitamin with minerals Tabs tablet Take 1 tablet by  mouth daily.   nitroGLYCERIN 0.4 MG SL tablet Commonly known as: NITROSTAT Place 1 tablet (0.4 mg total) under the tongue every 5 (five) minutes x 3 doses as needed for chest pain.   nutrition supplement (JUVEN) Pack Take 1 packet by mouth 2 (two) times daily between meals.   nystatin powder Commonly known as: MYCOSTATIN/NYSTOP Apply 1 Application topically 3 (three) times daily.   potassium chloride SA 20 MEQ tablet Commonly known as: KLOR-CON M Take 2 tablets (40 mEq total) by mouth daily.   Spiriva Respimat 1.25 MCG/ACT Aers Generic drug: Tiotropium Bromide Monohydrate Inhale 2 puffs into the lungs daily.  spironolactone 25 MG tablet Commonly known as: ALDACTONE Take 0.5 tablets (12.5 mg total) by mouth daily. Start taking on: October 12, 2022   torsemide 20 MG tablet Commonly known as: DEMADEX Take 4 tablets (80 mg total) by mouth 2 (two) times daily.   zinc sulfate 220 (50 Zn) MG capsule Take 1 capsule (220 mg total) by mouth daily.        Disposition   The patient will be discharged in stable condition to home. Discharge Instructions     (HEART FAILURE PATIENTS) Call MD:  Anytime you have any of the following symptoms: 1) 3 pound weight gain in 24 hours or 5 pounds in 1 week 2) shortness of breath, with or without a dry hacking cough 3) swelling in the hands, feet or stomach 4) if you have to sleep on extra pillows at night in order to breathe.   Complete by: As directed    AMB Referral to Cardiac Rehabilitation - Phase II   Complete by: As directed    Diagnosis: Valve Repair   Valve: Mitral   After initial evaluation and assessments completed: Virtual Based Care may be provided alone or in conjunction with Phase 2 Cardiac Rehab based on patient barriers.: Yes   Intensive Cardiac Rehabilitation (ICR) [facility] location only OR Traditional Cardiac Rehabilitation (TCR) *If criteria for ICR are not met will enroll in TCR [facility] only): Yes   Diet - low sodium heart healthy    Complete by: As directed    Heart Failure patients record your daily weight using the same scale at the same time of day   Complete by: As directed    Increase activity slowly   Complete by: As directed        Follow-up Information     [facility] Follow up.   Why: Home Health RN, Phyiscal Therapy and Occupational Therapy-agency will call to arrange appt Contact information: [facility] up on 10/23/2022.   Specialty: Cardiology Why: at 1:30 Contact information: [id number] [place]        Tommie [facility], NP Follow up on 11/16/2022.   Specialty: Cardiology Why: at 935am. Please arrive to your appointment at 915am. This will be for an echocardiogram then office viist to follow Contact information: [place] [id number] [phone]                   Duration of Discharge Encounter: Greater than 35 minutes   Signed, Lalaine Overstreet Np-C  10/11/2022, 11:35 AM

## 2022-10-10 NOTE — Progress Notes (Addendum)
Patient ID: Ryan Garner, male   DOB: [birth year], 77 y.o.   MRN: [id number]     Advanced Heart Failure Rounding Note  PCP-Cardiologist: Dorris Carnes, MD   Subjective:   2/26 acute abdominal pain>>CT A/P shows large left rectus sheath hematoma. Heparin d/ced. Lasix gtt stopped.  2/27 acute HF w/ acute worsening of dyspnea>>120 mg IV Lasix + BiPAP 3/3 Lasix drip increased to 20mg  per hour and given metolazone.  3/17 switched to torsemide.   Co-ox stable at 72%. Wt stable on PO diuretics. Scr stable at 1.5  Awaiting MitraClip this afternoon.   Feels well today. OOB and up in chair. Denies dyspnea. No chest pain.    RHC (3/13):  Hemodynamics (mmHg) RA mean 17 RV 78/17 PA 78/28, mean 46 PCWP mean 24. V waves were not prominent.  Oxygen saturations: PA 53% AO 94% Cardiac Output (Fick) 5.66  Cardiac Index (Fick) 2.59 PVR 3.9 WU PAPi 2.9   TEE (3/12): LV EF 55%, D-shaped septum, moderate RV dysfunction, severe functional Ryan (suspect atrial functional), mild TR, moderate aortic stenosis.    Objective:   Weight Range: Body mass index is 33.25 kg/m.   Vital Signs:   Temp:  [97.7 F (36.5 C)-98.6 F (37 C)] 98.6 F (37 C) (03/20 0742) Pulse Rate:  [75-96] 89 (03/20 0742) Resp:  [15-20] 18 (03/20 0745) BP: (100-119)/(65-75) 106/70 (03/20 0742) SpO2:  [95 %-100 %] 99 % (03/20 0742) Weight:  [99.2 kg] 99.2 kg (03/20 0400) Last BM Date : 10/09/22  Weight change: Filed Weights   10/08/22 0621 10/09/22 0239 10/10/22 0400  Weight: 98.8 kg 98.6 kg 99.2 kg    Intake/Output:   Intake/Output Summary (Last 24 hours) at 10/10/2022 0859 Last data filed at 10/10/2022 0558 Gross per 24 hour  Intake 1012.71 ml  Output 2350 ml  Net -1337.29 ml   Physical Exam   General:  Well appearing, obese. No respiratory difficulty HEENT: normal Neck: supple. no JVD. Carotids 2+ bilat; no bruits. No lymphadenopathy or thyromegaly appreciated. Cor: PMI nondisplaced. Irregularly irregular  rhythm. 3/6 Ryan murmur, loudest at apex  Lungs: clear Abdomen: soft, nontender, nondistended. No hepatosplenomegaly. No bruits or masses. Good bowel sounds. Extremities: no cyanosis, clubbing, rash, edema + RUE PICC + unna boots  Neuro: alert & oriented x 3, cranial nerves grossly intact. moves all 4 extremities w/o difficulty. Affect pleasant.   Telemetry    A fib 70-90s   Labs  CBC Recent Labs    10/09/22 0505 10/10/22 0446  WBC 6.9 7.2  HGB 10.9* 10.2*  HCT 31.9* 30.8*  MCV 88.1 90.1  PLT 363 [id number]   Basic Metabolic Panel Recent Labs    10/09/22 0505 10/10/22 0446  NA 131* 129*  K 3.9 4.0  CL 89* 90*  CO2 30 28  GLUCOSE 126* 127*  BUN 54* 55*  CREATININE 1.63* 1.52*  CALCIUM 10.1 9.7  MG 1.8 2.0   BNP: BNP (last 3 results) Recent Labs    11/20/21 1409 04/06/22 1253 09/12/22 2101  BNP 485.0* 682.5* 577.3*   ProBNP (last 3 results) Recent Labs    08/15/22 1049 09/04/22 1404 09/11/22 1530  PROBNP 5,315* 11,525* 8,444*   Imaging   No results found.  Medications:    Scheduled Medications:  atorvastatin  80 mg Oral Daily   chlorhexidine  15 mL Mouth/Throat Once   Chlorhexidine Gluconate Cloth  6 each Topical Daily   Chlorhexidine Gluconate Cloth  6 each Topical Once   midodrine  2.5  mg Oral TID WC   potassium chloride  40 mEq Oral BID   senna-docusate  1 tablet Oral Daily   sodium chloride flush  3 mL Intravenous Q12H   spironolactone  12.5 mg Oral Daily   torsemide  80 mg Oral BID   umeclidinium bromide  1 puff Inhalation Daily   Infusions:  sodium chloride     sodium chloride 10 mL/hr at 10/10/22 0553    ceFAZolin (ANCEF) IV     heparin 1,200 Units/hr (10/10/22 0558)   PRN Medications: sodium chloride, acetaminophen, albuterol, guaiFENesin-dextromethorphan, morphine injection, ondansetron (ZOFRAN) IV, oxyCODONE, sodium chloride flush  Patient Profile  Ryan Garner is 77 year old with a history of CAD, STEMI 2021, DES to LCx, permanent A  fib, severe Ryan, HTN, HLD, sleep apnea, Ryan, chronic lymphedema, and HFpEF.    Admitted with A/C HFpEF.  Assessment/Plan  1. Acute on chronic HFpEF: With prominent RV dysfunction.  Echo this admission shows EF 50-55%, D-shaped septum, moderate-severe RV enlargement with moderate RV dysfunction, PASP 69, severe biatrial enlargement, moderate AS with mean gradient 21 mmHg/AVA 1.2 cm^2, IVC dilated. Diuretics held 2/26 w/ ABLA from rectus sheath hematoma. Had worsening HF/dyspnea>>120 mg IV Lasix + BiPAP. Lasix infusion started 2/28.  Initially on dobutamine for RV support, now off.  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  CI 2.6, PAPi adequate at 2.9.  - CO-OX stable. Volume status stable.  Overall down 90 pounds - Continue po torsemide 80 bid (was on lasix 120 bid PTA) - Continue spironolactone 12.5 daily.  - On midodrine 2.5 tid.  2. Mitral regurgitation: Severe Ryan by echo.  TEE on 3/12 showed LV EF 55%, D-shaped septum, moderate RV dysfunction, severe functional Ryan (suspect atrial functional, also significant posterior leaflet restriction), mild TR, moderate aortic stenosis.  - mTEER tentatively planned for today    -  RHC reviewed filling pressures up with mixed pre & post capillary components.  3. AKI on CKD 3: Suspect cardiorenal in etiology.  - Scr stable at 1.5  - Daily BMET 4. Atrial fibrillation: Permanent, has been seen by EP in the past. Not likely to successfully cardiovert and stay in NSR.  Eliquis restarted on 3/2 (held prior for rectus sheath hematoma from heparin).   Remains in A fib with controlled rate.  - Continue Heparin drip.   5. CAD:   Had DES to mLCx in 10/21.  No s/s angina - Continue atorvastatin - No ASA with Eliquis use.  6. LE wounds: Followed as outpatient by Dr. Sharol Given.  ABIs normal this admission.  - Wound care - Improved 7. Left Rectus Sheath Hematoma: 15.0 x 7.4  x 14.7 cm hematoma on CT 2/26.  - Resolved 8. Anemia, acute blood loss: in setting of rectus sheath hematoma. Now stable.  - hgb stable, 10.2  9. Aortic stenosis: Moderate on TEE.  10. Dispo - Pt no longer candidate for CIR as he has significantly improved. Plan for [facility] PT/OT. May need to reevaluate post mTEER. 11. Hypomag - Improved w/ Supp, 2.0 today    Nelida Gores  10/10/2022 8:59 AM   Patient seen and examined with the above-signed Advanced Practice Provider and/or Housestaff. I personally reviewed laboratory data, imaging studies and relevant notes. I independently examined the patient and formulated the important aspects of the plan. I have edited the note to reflect any of my changes or salient points. I have  personally discussed the plan with the patient and/or family.  Feels good. Denies SOB, orthopnea or PND.   General:  Sitting in chair  No resp difficulty HEENT: normal Neck: supple. no JVD. Carotids 2+ bilat; no bruits. No lymphadenopathy or thryomegaly appreciated. Cor: PMI nondisplaced. Irregular rate & rhythm.2/6 Ryan Lungs: clear Abdomen: obese soft, nontender, nondistended. No hepatosplenomegaly. No bruits or masses. Good bowel sounds. Extremities: no cyanosis, clubbing, rash, edema Neuro: alert & orientedx3, cranial nerves grossly intact. moves all 4 extremities w/o difficulty. Affect pleasant  Much improved. For mTEER today. D/w Dr. Burt Knack.   Glori Bickers, MD  1:46 PM

## 2022-10-10 NOTE — Progress Notes (Signed)
Physical Therapy Treatment Patient Details Name: Ryan Garner MRN: [id number] DOB: 1945-09-22 Today's Date: 10/10/2022   History of Present Illness Pt is a 77 y.o. M who presents 09/12/2022 with A/C HFpEF. 2/26 acute abdominal pain>>CT A/P shows large left rectus sheath hematoma.Significant PMH: CAD, STEMI 2021, DES to LCx, permanent A fib, severe MR, HTN, HLD, sleep apnea, DM, rotator cuff repair, MR, chronic lymphedema, and HFpEF.    PT Comments    Pt tolerated treatment well today. Pt was able to progress ambulation today by ambulating 64ft with rollator with no rest breaks this session. Pt continues to progress well and is scheduled for a procedure later this afternoon. No change in DC/DME recs at this time. PT will continue to follow.   Recommendations for follow up therapy are one component of a multi-disciplinary discharge planning process, led by the attending physician.  Recommendations may be updated based on patient status, additional functional criteria and insurance authorization.  Follow Up Recommendations  Home health PT Can patient physically be transported by private vehicle: Yes   Assistance Recommended at Discharge Frequent or constant Supervision/Assistance  Patient can return home with the following Assistance with cooking/housework;Help with stairs or ramp for entrance;Assist for transportation;A little help with walking and/or transfers;A little help with bathing/dressing/bathroom   Equipment Recommendations  None recommended by PT    Recommendations for Other Services       Precautions / Restrictions Precautions Precautions: Fall Restrictions Weight Bearing Restrictions: No     Mobility  Bed Mobility               General bed mobility comments: Pt received sitting in recliner    Transfers Overall transfer level: Modified independent Equipment used: Rollator (4 wheels) Transfers: Sit to/from Stand Sit to Stand: Modified independent  (Device/Increase time)                Ambulation/Gait Ambulation/Gait assistance: Modified independent (Device/Increase time) Gait Distance (Feet): 600 Feet Assistive device: Rollator (4 wheels) Gait Pattern/deviations: Step-through pattern, Decreased stride length Gait velocity: decreased     General Gait Details: no LOB noted. No standing rest breaks this session   Stairs             Wheelchair Mobility    Modified Rankin (Stroke Patients Only)       Balance Overall balance assessment: Needs assistance Sitting-balance support: Feet supported Sitting balance-Leahy Scale: Normal Sitting balance - Comments: In chair   Standing balance support: Bilateral upper extremity supported, During functional activity, Reliant on assistive device for balance Standing balance-Leahy Scale: Fair Standing balance comment: Pt able to stand without AD for brief periods                            Cognition Arousal/Alertness: Awake/alert Behavior During Therapy: Flat affect Overall Cognitive Status: Within Functional Limits for tasks assessed                                          Exercises      General Comments General comments (skin integrity, edema, etc.): VSS on RA      Pertinent Vitals/Pain Pain Assessment Pain Assessment: No/denies pain    Home Living                          Prior Function  PT Goals (current goals can now be found in the care plan section) Progress towards PT goals: Progressing toward goals    Frequency    Min 3X/week      PT Plan Current plan remains appropriate    Co-evaluation              AM-PAC PT "6 Clicks" Mobility   Outcome Measure  Help needed turning from your back to your side while in a flat bed without using bedrails?: A Little Help needed moving from lying on your back to sitting on the side of a flat bed without using bedrails?: A Little Help needed  moving to and from a bed to a chair (including a wheelchair)?: A Little Help needed standing up from a chair using your arms (e.g., wheelchair or bedside chair)?: A Little Help needed to walk in hospital room?: A Little Help needed climbing 3-5 steps with a railing? : A Lot 6 Click Score: 17    End of Session Equipment Utilized During Treatment: Gait belt Activity Tolerance: Patient tolerated treatment well Patient left: in chair;with call bell/phone within reach;with family/visitor present Nurse Communication: Mobility status PT Visit Diagnosis: Muscle weakness (generalized) (M62.81);Difficulty in walking, not elsewhere classified (R26.2)     Time: [id number] PT Time Calculation (min) (ACUTE ONLY): 18 min  Charges:  $Gait Training: 8-22 mins                     Shelby Mattocks, PT, DPT Acute Rehab Services [id number]    Viann Shove 10/10/2022, 11:12 AM

## 2022-10-10 NOTE — Interval H&P Note (Signed)
History and Physical Interval Note:  10/10/2022 1:04 PM  Ryan Garner  has presented today for surgery, with the diagnosis of severe mitral insifficiency.  The various methods of treatment have been discussed with the patient and family. After consideration of risks, benefits and other options for treatment, the patient has consented to  Procedure(s): MITRAL VALVE REPAIR (N/A) TRANSESOPHAGEAL ECHOCARDIOGRAM (N/A) as a surgical intervention.  The patient's history has been reviewed, patient examined, no change in status, stable for surgery.  I have reviewed the patient's chart and labs.  Questions were answered to the patient's satisfaction.     [name]

## 2022-10-10 NOTE — Progress Notes (Signed)
Patient has home BiPAP unit at bedside. RT filled water chamber at patient request.  Patient stated he didn't need any assistance with applying his mask.

## 2022-10-10 NOTE — Anesthesia Procedure Notes (Signed)
Procedure Name: Intubation Date/Time: 10/10/2022 2:04 PM  Performed by: Griffin Dakin, CRNAPre-anesthesia Checklist: Patient identified, Emergency Drugs available, Suction available and Patient being monitored Patient Re-evaluated:Patient Re-evaluated prior to induction Oxygen Delivery Method: Circle system utilized Preoxygenation: Pre-oxygenation with 100% oxygen Induction Type: IV induction Ventilation: Mask ventilation without difficulty Laryngoscope Size: Glidescope and 4 Grade View: Grade I Tube type: Oral Tube size: 8.0 mm Number of attempts: 1 Airway Equipment and Method: Rigid stylet and Video-laryngoscopy Placement Confirmation: ETT inserted through vocal cords under direct vision, positive ETCO2 and breath sounds checked- equal and bilateral Secured at: 24 cm Tube secured with: Tape Dental Injury: Teeth and Oropharynx as per pre-operative assessment

## 2022-10-10 NOTE — Op Note (Signed)
[facility]   MULTIDISCIPLINARY HEART TEAM  Date of Procedure:  10/10/2022  Preoperative Diagnosis: Severe Symptomatic Mitral Regurgitation (Stage D)  Postoperative Diagnosis: Same   Procedure Performed: Ultrasound-guided right transfemoral venous access Double PreClose right femoral vein Transseptal puncture using Bailess RF needle Mitral edge to edge repair with PASCAL ACE x 2  Surgeon: Blane Ohara, MD Co-Surgeon: Lenna Sciara, MD Echocardiographer: Sanda Klein, MD  Anesthesiologist: Laurie Panda, MD  Device Implant: PASCAL ACE (Serial # [id number] ) placed at A2/P2                             PASCAL ACE (Serial # [id number]) placed at A3/P3  Procedural Indication: Severe Non-rheumatic Mitral Regurgitation (Stage D)   Brief History: 77 yo male with complex medical and cardiac problems who presented with acute on chronic diastolic heart failure with a prominent right-sided component.  He was massively volume overloaded and has been diuresed 90 pounds of fluid since admission.  He had a prolonged hospitalization complicated by rectus sheath hematoma.  He has clinically improved and he underwent TEE evaluation to assess his valvular heart disease and further evaluate his treatment options.  He was found to have severe mitral regurgitation secondary to annular dilatation, posterior leaflet restriction, and significant leaflet malcoaptation.  He also has moderate aortic stenosis that is felt to be appropriate for continued surveillance at this time.  After review of treatment options, we elected to proceed with transcatheter edge-to-edge repair of the mitral valve with a Pascal device.  NYHA class: 3  Echo Findings: Preop:  Normal LV systolic function Severe RV dilatation and RV dysfunction Severe MR secondary to annular dilatation (atrial) and posterior leaflet restriction, Grade 4+ Post-op:  Unchanged LV systolic function 1+ residual MR  Procedural  Details: Prep The patient is brought to the cardiac catheterization lab in the fasting state. General anesthesia is induced. The patient is prepped from the groin to chin.   Venous Access Using ultrasound guidance, the right femoral vein is punctured using a micropuncture technique. Ultrasound images are captured and stored in the patient's chart. The vein is dilated and 2 Perclose devices are deplyed at 10' and 2' positions to 'Preclose' the femoral vein. An 8 Fr sheath is inserted. After progressively dilating the femoral vein, a 24 French dry seal sheath is inserted over a versa cross wire which was positioned in the SVC.  Transseptal Puncture A Baylis Versacross wire is advanced into the SVC A Baylis transseptal dilator is advanced into the SVC, and the VersaCross RF wire is retracted into the dilator  The transseptal sheath is retracted into the RA under fluoroscopic and echo guidance to obtain position on the posterior fossa where echo measurements are made to assure appropriate access to the mitral valve. Once proper position is confirmed by echo, RF energy is delivered and the VersaCross wire is advanced into the LA without resistance. The dilator and sheath are advanced over the wire where proper position is confirmed by echo and pressure measurement Weight based IV heparin is administered and a therapeutic ACT > 250 is confirmed  Guide Catheter Insertion The VersaCross wire is positioned at the left upper pulmonary vein The femoral vein is progressively dilated and the 22 Fr guide catheter is inserted and then directed across the interatrial septum over the wire. Position is confirmed approximately 2 cm into the left atrium.  The dilator and wire is then removed and the  guide catheter is locked into place on the stabilizer rail platform system.  PASCAL (P10) Insertion The PASCAL (P10) is prepped per protocol and inserted via the introducer into the guide catheter The implant system  (consisting of the steerable and implant catheters) is through the loader, the loader is removed, and 45cc of blood is aspirated and the system is flushed with an equal volume of saline. The implant system is advanced under fluoro and echo guidance so that it exits the guide catheter into the left atrium.  The device is then closed and the clasps are retracted.  PASCAL (P10) Positioning in the Left Atrium (Supravalvular Alignment) Using flexion and torquing of the steerable catheter as well as advancement and retraction of the guide catheter, the device is centered above the target of therapy and with an optimized trajectory 2D and 3D TEE imaging is performed in multiple planes and the device is positioned and aligned above the valve using standard steering techniques  The device is positioned over the medial aspect of the valve where the most prominent mitral regurgitation jet is located.  Entry into the Left Ventricle and Mitral Valve Leaflet Grasp The device is opened to the capture-ready configuration The clasps are then independently lowered to test functionality The device is advanced across the mitral valve into the LV, maintaining proper orientation The Clip arms are opened to 180 degrees and the device is slowly retracted  Capture of both the anterior and posterior leaflets are visualized by echo and the clasps are dropped Multiple grasps are made using a variety of techniques, initially closer to the Center of the valve and later moved more medially.  Extensive imaging and caution is taken to make sure that the arm angles are appropriate and well aligned with the mitral valve leaflets.  PASCAL (P10) Deployment After extensive echo evaluation, reduction in mitral regurgitation is felt to be adequate The suture locks are then unthreaded and removed from the lock base After release of the posterior suture, the posterior leaflet is shown to be free and not attached to the device.  The  anterior device arm is opened, clasps raised, and the device is removed from the body. After review of treatment options, we elected to attempt a Pascal ACE device as an alternative to the initial Pascal P10 device.  The Pascal ACE device is prepped per protocol and inserted into the introducer sheath.  The sheath is aspirated and blood return to the patient through a peripheral IV.  The implant system is advanced into the left atrium under fluoroscopic and echo guidance.  The device is positioned on the atrial side of the mitral valve after flexing and torquing the steerable catheter.  The device arms are aligned with the mitral valve and the device is rotated so that it has coaxial alignment with the anterior and posterior leaflets of the mitral valve over the medial aspect of the valve.  The device is advanced onto the ventricular side and we were able to independently grasp the anterior leaflet, swing the device posterior, and then grasped the posterior leaflet over the medial side of the A2/P2.  The device is closed and mitral regurgitation reduction is felt to be adequate.  The device is released using standard technique after removing the posterior and anterior sutures and the device is confirmed to remain stable with good leaflet insertion.  There is appropriate MR reduction, but still 3+ MR.  The mean transmitral gradient is 6 mmHg.  We felt that a second  Pascal ACE device was necessary, to be positioned on the medial side of the first device.  Placement of PASCAL ACE PASCAL Insertion The PASCAL ACE is prepped per protocol and inserted via the introducer into the steerable guide catheter The implant system is advanced under fluoro and echo guidance so that it exits the guide catheter into the left atrium.  The device is then closed and the clasps are retracted.  PASCAL ACE Positioning in the Left Atrium (Supravalvular Alignment) Using flexion and torquing of the steerable catheter as well as  advancement and retraction of the guide catheter, the device is centered above the target of therapy and with an optimized trajectory 2D and 3D TEE imaging is performed in multiple planes and the device is positioned and aligned above the valve using standard steering techniques   Entry into the Left Ventricle and Mitral Valve Leaflet Grasp The PASCAL ACE is advanced across the mitral valve into the LV, positioned over the medial side of the valve, medial to the initial device, maintaining proper orientation and with caution taken to avoid contact with the first PASCAL device Capture of both the anterior and posterior leaflets are visualized by echo and the clasps are dropped  PASCAL ACE Deployment After extensive echo evaluation, reduction in mitral regurgitation is felt to be adequate The suture locks are then unthreaded and removed from the lock base The implant lock is then unthreaded and removed from the lock base  Device Removal The implant system is removed under echo guidance The guide catheter is retracted into the right atrium and the interatrial septum is assessed by echo without evidence of right-to-left shunting or large ASD  Hemostasis The guide catheter is removed over a 0.035" wire and the Perclose sutures are tightened with complete hemostasis and no evidence of hematoma  Estimated blood loss: minimal  There are no immediate procedural complications. The patient is transferred to the post-procedure recovery area in stable condition.   Final conclusion: Successful transcatheter edge-to-edge repair of the mitral valve using 2 Pascal ACE devices, with the initial device positioned A2 P2 and the second device positioned A3 P3 (medial to the first device).  Mitral regurgitation reduction from 4+ at baseline to 1+ post procedure.  Post procedure transmitral gradient 6 mmHg.  Plan: transfer to [place], lasix 80 mg IV x 1 (received 1500 cc saline during procedure), resume apixaban in the  am (no heparin overnight), 2D echo tomorrow.  [name] 10/10/2022 6:35 PM

## 2022-10-10 NOTE — Progress Notes (Signed)
  Echocardiogram Echocardiogram Transesophageal has been performed.  Bobbye Charleston 10/10/2022, 6:03 PM

## 2022-10-11 ENCOUNTER — Other Ambulatory Visit: Payer: Self-pay | Admitting: Cardiology

## 2022-10-11 ENCOUNTER — Inpatient Hospital Stay (HOSPITAL_COMMUNITY)
Admission: RE | Admit: 2022-10-11 | Discharge: 2022-10-11 | Disposition: A | Discharge status: Home / Self Care | Payer: Medicare Other | Source: Ambulatory Visit | Attending: Cardiovascular Disease | Admitting: Cardiovascular Disease

## 2022-10-11 ENCOUNTER — Encounter: Payer: Self-pay | Admitting: Cardiology

## 2022-10-11 ENCOUNTER — Other Ambulatory Visit (HOSPITAL_COMMUNITY): Payer: Self-pay

## 2022-10-11 ENCOUNTER — Encounter (HOSPITAL_COMMUNITY): Payer: Self-pay | Admitting: Cardiovascular Disease

## 2022-10-11 ENCOUNTER — Other Ambulatory Visit (HOSPITAL_COMMUNITY): Payer: Medicare Other

## 2022-10-11 DIAGNOSIS — Z95818 Presence of other cardiac implants and grafts: Secondary | ICD-10-CM | POA: Diagnosis not present

## 2022-10-11 DIAGNOSIS — Z954 Presence of other heart-valve replacement: Secondary | ICD-10-CM

## 2022-10-11 DIAGNOSIS — I080 Rheumatic disorders of both mitral and aortic valves: Secondary | ICD-10-CM | POA: Diagnosis not present

## 2022-10-11 DIAGNOSIS — I5033 Acute on chronic diastolic (congestive) heart failure: Secondary | ICD-10-CM | POA: Diagnosis not present

## 2022-10-11 DIAGNOSIS — Z9889 Other specified postprocedural states: Secondary | ICD-10-CM

## 2022-10-11 DIAGNOSIS — Z1152 Encounter for screening for COVID-19: Secondary | ICD-10-CM | POA: Diagnosis not present

## 2022-10-11 DIAGNOSIS — Z006 Encounter for examination for normal comparison and control in clinical research program: Secondary | ICD-10-CM | POA: Diagnosis not present

## 2022-10-11 DIAGNOSIS — I340 Nonrheumatic mitral (valve) insufficiency: Secondary | ICD-10-CM

## 2022-10-11 DIAGNOSIS — I130 Hypertensive heart and chronic kidney disease with heart failure and stage 1 through stage 4 chronic kidney disease, or unspecified chronic kidney disease: Secondary | ICD-10-CM | POA: Diagnosis not present

## 2022-10-11 DIAGNOSIS — Z66 Do not resuscitate: Secondary | ICD-10-CM | POA: Diagnosis not present

## 2022-10-11 LAB — CBC
HCT: 30.2 % — ABNORMAL LOW (ref 39.0–52.0)
Hemoglobin: 9.9 g/dL — ABNORMAL LOW (ref 13.0–17.0)
MCH: 29.9 pg (ref 26.0–34.0)
MCHC: 32.8 g/dL (ref 30.0–36.0)
MCV: 91.2 fL (ref 80.0–100.0)
Platelets: 284 10*3/uL (ref 150–400)
RBC: 3.31 MIL/uL — ABNORMAL LOW (ref 4.22–5.81)
RDW: 16.1 % — ABNORMAL HIGH (ref 11.5–15.5)
WBC: 8.3 10*3/uL (ref 4.0–10.5)
nRBC: 0 % (ref 0.0–0.2)

## 2022-10-11 LAB — ECHOCARDIOGRAM COMPLETE
AR max vel: 1 cm2
AV Area VTI: 0.91 cm2
AV Area mean vel: 1.14 cm2
AV Mean grad: 22 mmHg
AV Peak grad: 41 mmHg
Ao pk vel: 3.2 m/s
Area-P 1/2: 2.57 cm2
Calc EF: 40.8 %
MV M vel: 4.42 m/s
MV Peak grad: 78 mmHg
Radius: 0.5 cm
S' Lateral: 3.6 cm
Single Plane A2C EF: 44 %
Single Plane A4C EF: 41.3 %
Weight: 3612.02 oz

## 2022-10-11 LAB — COOXEMETRY PANEL
Carboxyhemoglobin: 2.8 % — ABNORMAL HIGH (ref 0.5–1.5)
Methemoglobin: <0.7 % (ref 0.0–1.5)
O2 Saturation: 71.5 %
Total hemoglobin: 11.4 g/dL — ABNORMAL LOW (ref 12.0–16.0)

## 2022-10-11 LAB — BASIC METABOLIC PANEL
Anion gap: 14 (ref 5–15)
BUN: 55 mg/dL — ABNORMAL HIGH (ref 8–23)
CO2: 26 mmol/L (ref 22–32)
Calcium: 9.6 mg/dL (ref 8.9–10.3)
Chloride: 91 mmol/L — ABNORMAL LOW (ref 98–111)
Creatinine, Ser: 1.77 mg/dL — ABNORMAL HIGH (ref 0.61–1.24)
GFR, Estimated: 39 mL/min — ABNORMAL LOW (ref >60)
Glucose, Bld: 166 mg/dL — ABNORMAL HIGH (ref 70–99)
Potassium: 4.3 mmol/L (ref 3.5–5.1)
Sodium: 131 mmol/L — ABNORMAL LOW (ref 135–145)

## 2022-10-11 LAB — MAGNESIUM: Magnesium: 1.9 mg/dL (ref 1.7–2.4)

## 2022-10-11 MED ORDER — MAGNESIUM SULFATE 2 GM/50ML IV SOLN
2.0000 g | Freq: Once | INTRAVENOUS | Status: AC
  Administered 2022-10-11: 2 g via INTRAVENOUS
  Filled 2022-10-11: qty 50

## 2022-10-11 MED ORDER — SPIRONOLACTONE 25 MG PO TABS
12.5000 mg | ORAL_TABLET | Freq: Every day | ORAL | 6 refills | Status: DC
  Filled 2022-10-11: qty 30, 60d supply, fill #0

## 2022-10-11 MED ORDER — TORSEMIDE 20 MG PO TABS
80.0000 mg | ORAL_TABLET | Freq: Two times a day (BID) | ORAL | Status: DC
  Administered 2022-10-11: 80 mg via ORAL
  Filled 2022-10-11: qty 4

## 2022-10-11 MED ORDER — MIDODRINE HCL 2.5 MG PO TABS
2.5000 mg | ORAL_TABLET | Freq: Three times a day (TID) | ORAL | 6 refills | Status: DC
  Filled 2022-10-11: qty 90, 30d supply, fill #0

## 2022-10-11 MED ORDER — TORSEMIDE 20 MG PO TABS
80.0000 mg | ORAL_TABLET | Freq: Two times a day (BID) | ORAL | 6 refills | Status: DC
  Filled 2022-10-11: qty 240, 30d supply, fill #0

## 2022-10-11 NOTE — TOC CM/SW Note (Signed)
CM contacted [employer] for dc home with HH. Spoke to pt and son at bedside. Son will provide transportation today. Jonnie Finner RN3 CCM, Heart Failure TOC CM [phone] Terrica Duecker RN3 CCM, Heart Failure TOC CM [phone]

## 2022-10-11 NOTE — Progress Notes (Signed)
Pt educated on restrictions, ex guidelines, HH diet, low sodium diet, and CRPII. Will refer to AP.

## 2022-10-11 NOTE — Progress Notes (Signed)
Echocardiogram 2D Echocardiogram has been performed.  Oneal Deputy Najmo Pardue RDCS 10/11/2022, 4:31 PM

## 2022-10-11 NOTE — Progress Notes (Signed)
Mobility Specialist Progress Note:   10/11/22 0915  Mobility  Activity Ambulated with assistance in hallway  Level of Assistance Contact guard assist, steadying assist  Assistive Device Four wheel walker  Distance Ambulated (ft) 200 ft  Activity Response Tolerated well  Mobility Referral Yes  $Mobility charge 1 Mobility   Pt agreeable to mobility session. Required minG assist for safety. Pt with incr weakness during today's session. VSS on RA. Pt left sitting EOB with all needs met.   Nelta Numbers Mobility Specialist Please contact via SecureChat or  Rehab office at [phone]

## 2022-10-11 NOTE — Anesthesia Postprocedure Evaluation (Signed)
Anesthesia Post Note  Patient: SONU WAMSER  Procedure(s) Performed: MITRAL VALVE REPAIR TRANSESOPHAGEAL ECHOCARDIOGRAM     Patient location during evaluation: PACU Anesthesia Type: General Level of consciousness: awake and alert Pain management: pain level controlled Vital Signs Assessment: post-procedure vital signs reviewed and stable Respiratory status: spontaneous breathing, nonlabored ventilation, respiratory function stable and patient connected to nasal cannula oxygen Cardiovascular status: blood pressure returned to baseline and stable Postop Assessment: no apparent nausea or vomiting Anesthetic complications: no   There were no known notable events for this encounter.  Last Vitals:  Vitals:   10/11/22 0514 10/11/22 0830  BP:  102/68  Pulse: 79 95  Resp: 14 20  Temp:    SpO2: 97% 97%    Last Pain:  Vitals:   10/11/22 0830  TempSrc: Oral  PainSc:                  Audry Pili

## 2022-10-11 NOTE — Evaluation (Signed)
Physical Therapy Re-Evaluation Patient Details Name: Ryan Garner MRN: [id number] DOB: 14-Mar-1946 Today's Date: 10/11/2022  History of Present Illness  Pt is a 77 y.o. M who presents 09/12/2022 with A/C HFpEF. 2/26 acute abdominal pain>>CT A/P shows large left rectus sheath hematoma.Significant PMH: CAD, STEMI 2021, DES to LCx, permanent A fib, severe MR, HTN, HLD, sleep apnea, DM, rotator cuff repair, MR, chronic lymphedema, and HFpEF.  Clinical Impression  Pt RN expressed concern about decreased mobility s/p heart catheterization yesterday. Pt reports he is feeling fine. Son reports he was a little unsteady this morning but that it was thought to be residual effects of anesthesia. Pt is mod I for transfers and mod I for ambulation 400 feet with Rollator, with steady gait. Pt reports looking forward to going home this afternoon and that he is feeling better. Pt will have necessary assistance at home at discharge. PT continues to recommend HHPT which has already been set up.         Recommendations for follow up therapy are one component of a multi-disciplinary discharge planning process, led by the attending physician.  Recommendations may be updated based on patient status, additional functional criteria and insurance authorization.  Follow Up Recommendations Home health PT Can patient physically be transported by private vehicle: Yes    Assistance Recommended at Discharge Frequent or constant Supervision/Assistance  Patient can return home with the following  Assistance with cooking/housework;Help with stairs or ramp for entrance;Assist for transportation;A little help with walking and/or transfers;A little help with bathing/dressing/bathroom    Equipment Recommendations None recommended by PT     Functional Status Assessment Patient has had a recent decline in their functional status and demonstrates the ability to make significant improvements in function in a reasonable and predictable  amount of time.     Precautions / Restrictions Precautions Precautions: Fall Restrictions Weight Bearing Restrictions: No      Mobility  Bed Mobility               General bed mobility comments: Pt received sitting in recliner    Transfers Overall transfer level: Modified independent Equipment used: Rollator (4 wheels) Transfers: Sit to/from Stand Sit to Stand: Modified independent (Device/Increase time)           General transfer comment: good power up and self steadying at [employer]    Ambulation/Gait Ambulation/Gait assistance: Modified independent (Device/Increase time) Gait Distance (Feet): 400 Feet Assistive device: Rollator (4 wheels) Gait Pattern/deviations: Step-through pattern, Decreased stride length Gait velocity: WFL Gait velocity interpretation: 1.31 - 2.62 ft/sec, indicative of limited community ambulator   General Gait Details: strong, steady gait, no assist needed no LoB        Balance Overall balance assessment: Needs assistance Sitting-balance support: Feet supported Sitting balance-Leahy Scale: Normal Sitting balance - Comments: In chair   Standing balance support: Bilateral upper extremity supported, During functional activity, Reliant on assistive device for balance Standing balance-Leahy Scale: Fair Standing balance comment: able to static stand without AD                             Pertinent Vitals/Pain Pain Assessment Pain Assessment: No/denies pain            Communication    WFL  Cognition Arousal/Alertness: Awake/alert Behavior During Therapy: Flat affect Overall Cognitive Status: Within Functional Limits for tasks assessed  General Comments General comments (skin integrity, edema, etc.): VSS on RA        Assessment/Plan    PT Assessment Patient needs continued PT services  PT Problem List Decreased strength;Decreased activity  tolerance;Decreased balance;Decreased mobility;Cardiopulmonary status limiting activity;Obesity       PT Treatment Interventions DME instruction;Gait training;Functional mobility training;Therapeutic activities;Therapeutic exercise;Balance training;Cognitive remediation    PT Goals (Current goals can be found in the Care Plan section)  Acute Rehab PT Goals PT Goal Formulation: With patient Time For Goal Achievement: 10/16/22 Potential to Achieve Goals: Good    Frequency Min 3X/week        AM-PAC PT "6 Clicks" Mobility  Outcome Measure Help needed turning from your back to your side while in a flat bed without using bedrails?: A Little Help needed moving from lying on your back to sitting on the side of a flat bed without using bedrails?: A Little Help needed moving to and from a bed to a chair (including a wheelchair)?: A Little Help needed standing up from a chair using your arms (e.g., wheelchair or bedside chair)?: A Little Help needed to walk in hospital room?: A Little Help needed climbing 3-5 steps with a railing? : A Lot 6 Click Score: 17    End of Session Equipment Utilized During Treatment: Gait belt Activity Tolerance: Patient tolerated treatment well Patient left: in chair;with call bell/phone within reach;with family/visitor present Nurse Communication: Mobility status PT Visit Diagnosis: Muscle weakness (generalized) (M62.81);Difficulty in walking, not elsewhere classified (R26.2)    Time: [id number] PT Time Calculation (min) (ACUTE ONLY): 14 min   Charges:     PT Treatments $Therapeutic Exercise: 8-22 mins        Ryan Garner B. Migdalia Dk PT, DPT Acute Rehabilitation Services Please use secure chat or  Call Office [phone]   [place] 10/11/2022, 1:45 PM

## 2022-10-11 NOTE — Progress Notes (Addendum)
  [facility] VALVE TEAM  Patient doing well s/p TEER. Plan to follow closely with AHF s/p discharge. I have scheduled the patient for one month s/p TEER with the structural heart team with repeat echocardiogram on 11/16/22 at 935am for echo and office visit to follow. I will place this in AVS for discharge review.   Kathyrn Drown NP-C Structural Heart Team  Pager: [phone] Phone: [phone]  Addendum: Patient seen this morning.  He is doing well.  His right groin site is clear with no hematoma or ecchymosis.  Reviewed notes of the advanced heart failure team with plans for discharge today.  He will have a postoperative day #1 echo prior to discharge.  Structural heart follow-up will be arranged.  [name] MD 10/11/2022 11:45 AM

## 2022-10-11 NOTE — Progress Notes (Addendum)
Patient ID: Ryan Garner, male   DOB: [birth year], 77 y.o.   MRN: [id number]     Advanced Heart Failure Rounding Note  PCP-Cardiologist: Dorris Carnes, MD   Subjective:   2/26 acute abdominal pain>>CT A/P shows large left rectus sheath hematoma. Heparin d/ced. Lasix gtt stopped.  2/27 acute HF w/ acute worsening of dyspnea>>120 mg IV Lasix + BiPAP 3/3 Lasix drip increased to 20mg  per hour and given metolazone.  3/17 switched to torsemide.   Co-ox stable at 72%. Scr 1.77. Weight "up 7 lbs" but was weighted in bed and had previously been doing standing weights. CVP 5  s/p mTEER 3/20. 2 clips successfully placed MR +4>MR +1  RHC (3/13):  Hemodynamics (mmHg) RA mean 17 RV 78/17 PA 78/28, mean 46 PCWP mean 24. V waves were not prominent.  Oxygen saturations: PA 53% AO 94% Cardiac Output (Fick) 5.66  Cardiac Index (Fick) 2.59 PVR 3.9 WU PAPi 2.9   TEE (3/12): LV EF 55%, D-shaped septum, moderate RV dysfunction, severe functional MR (suspect atrial functional), mild TR, moderate aortic stenosis.    Objective:   Weight Range: Body mass index is 34.33 kg/m.   Vital Signs:   Temp:  [97.7 F (36.5 C)-98.5 F (36.9 C)] 97.7 F (36.5 C) (03/21 0238) Pulse Rate:  [78-115] 79 (03/21 0514) Resp:  [13-26] 14 (03/21 0514) BP: (98-133)/(60-89) 98/60 (03/21 0500) SpO2:  [93 %-100 %] 97 % (03/21 0514) FiO2 (%):  [21 %] 21 % (03/20 2126) Weight:  [102.4 kg] 102.4 kg (03/21 0100) Last BM Date : 10/10/22  Weight change: Filed Weights   10/09/22 0239 10/10/22 0400 10/11/22 0100  Weight: 98.6 kg 99.2 kg 102.4 kg    Intake/Output:   Intake/Output Summary (Last 24 hours) at 10/11/2022 0814 Last data filed at 10/11/2022 0355 Gross per 24 hour  Intake 1840 ml  Output 2700 ml  Net -860 ml   Physical Exam  CVP 5 General:  elderly appearing.  No respiratory difficulty HEENT: normal Neck: supple. JVD ~6 cm. Carotids 2+ bilat; no bruits. No lymphadenopathy or thyromegaly  appreciated. Cor: PMI nondisplaced. Regular rate & irregular rhythm. No rubs, gallops or murmurs. Lungs: diminished R>L Abdomen: soft, nontender, nondistended. No hepatosplenomegaly. No bruits or masses. Good bowel sounds. Extremities: no cyanosis, clubbing, rash, non-pitting BLE edema. + UNNA boots. PICC RUE Neuro: alert & oriented x 3, cranial nerves grossly intact. moves all 4 extremities w/o difficulty. Affect pleasant.  Telemetry    A fib 80s (Personally reviewed)    Labs  CBC Recent Labs    10/10/22 0446 10/11/22 0700  WBC 7.2 8.3  HGB 10.2* 9.9*  HCT 30.8* 30.2*  MCV 90.1 91.2  PLT 338 [id number]   Basic Metabolic Panel Recent Labs    10/10/22 0446 10/11/22 0400  NA 129* 131*  K 4.0 4.3  CL 90* 91*  CO2 28 26  GLUCOSE 127* 166*  BUN 55* 55*  CREATININE 1.52* 1.77*  CALCIUM 9.7 9.6  MG 2.0 1.9   BNP: BNP (last 3 results) Recent Labs    11/20/21 1409 04/06/22 1253 09/12/22 2101  BNP 485.0* 682.5* 577.3*   ProBNP (last 3 results) Recent Labs    08/15/22 1049 09/04/22 1404 09/11/22 1530  PROBNP 5,315* 11,525* 8,444*   Imaging   ECHO TEE  Result Date: 10/10/2022    TRANSESOPHOGEAL ECHO REPORT   Patient Name:   Ryan Garner Date of Exam: 10/10/2022 Medical Rec #:  [id number]      Height:  68.0 in Accession #:    [id number]     Weight:       218.7 lb Date of Birth:  [birth year]       BSA:          2.123 m Patient Age:    85 years       BP:           122/63 mmHg Patient Gender: M              HR:           102 bpm. Exam Location:  Inpatient Procedure: Transesophageal Echo, Cardiac Doppler, Color Doppler and 3D Echo Indications:     I34.0 Nonrheumatic mitral (valve) insufficiency  History:         Patient has prior history of Echocardiogram examinations. CHF,                  Abnormal ECG, Aortic Valve Disease and Mitral Valve Disease,                  Arrythmias:Atrial Fibrillation and Bradycardia; Risk                  Factors:Hypertension and Dyslipidemia.  Aortic stenosis. Severe                  mitral regurgitation.  Sonographer:     Roseanna Rainbow RDCS Referring Phys:  [id number] [name] Diagnosing Phys: Sanda Klein MD PROCEDURE: After discussion of the risks and benefits of a TEE, an informed consent was obtained from the patient. The transesophogeal probe was passed without difficulty through the esophogus of the patient. Imaged were obtained with the patient in a supine position. Sedation performed by different physician. The patient was monitored while under deep sedation. Anesthestetic sedation was provided intravenously by Anesthesiology: 110mg  of Propofol. The patient's vital signs; including heart rate, blood pressure, and oxygen saturation; remained stable throughout the procedure. The patient developed no complications during the procedure. Clip procedure using two devices. TEE, including live and postprocessed 3D imaging was used to guide transseptal puncture, device deployment, asess the final result and evaluate for complications. PREOPERATIVE FINDINGS Left ventricular systolic function is borderline decreased, EF 50-55%. There is severe mitral insufficiency. There is a broad-based, eccentric and posteriorly directed jet originating from malcoaptation of A2 and P2 (middle scallops), also slightly towards A3-P3 (medial scallops). This is largely due to annular dilation and  restricted posterior leaflet motion. Although there may be a small ruptured chorda to A2, the extent and severity of the regurgitation cannot be explained by this.The anterior lealfet is not flail. There is systolic flow reversal in the right pulmonary veins and systolic flow blunting/absence on the left. By PISA, the ERO area is 0.54 cm sq, regurgitant volume 70 ml, regurgitant fraction 50% (results prone to calculation error due to atrial fibrillation). The MR vena contracta is 6 mm. Baseline mitral valve gradient is 2 mm Hg at 90 bpm. There is mild tricuspid insufficiency and  estimated systolic PA pressure of 45 mm Hg at baseline. There is mild aortic stenosis. There is no pericardial effusion.  POSTOPERATIVE FINDINGS  Multiple attempts at Dobbins device deployment were made. At the end of the procedure, 2 Pascal ACE clips were deployed in the A2-P2 and A3-P3 locations. Each device was well attached to both mitral leaflets, with solid tissue bridge. Left ventricular systolic function appears unchanged, with EF 50-55%. There is mild mitral stenosis with a mean gradient  of 6 mm Hg at a mean heart rate of 111 bpm. There are 2 residual jets of mitral insufficiency, originating medial to the clips and directed towards the septum and originating between the clips and directed laterally. Each jet is mild in severity. The cumulative severity of mitral insufficiency is at most mild-to-moderate. There is forward systolic pulmonary vein flow in both right and left veins. There is a small iatrogenic secundum atrial septal defect with almost entirely left-to-right shunt. Estimated systolic PA pressure 46 mm Hg. There is no pericardial effusion.  IMPRESSIONS  1. Left ventricular ejection fraction, by estimation, is 50 to 55%. The left ventricle has low normal function. The left ventricle has no regional wall motion abnormalities.  2. Right ventricular systolic function is severely reduced. The right ventricular size is moderately enlarged.  3. Left atrial size was severely dilated. No left atrial/left atrial appendage thrombus was detected.  4. Right atrial size was moderately dilated.  5. Cannot exclude a tiny ruptured chord attached to the middle portion of the anterior leaflet, but the major cause of mitral insufficiency appears to be annular dilation and malcoaptation ("atrial functional MR"). The mitral valve is abnormal. Severe mitral valve regurgitation.  6. The aortic valve is tricuspid. There is moderate calcification of the aortic valve. There is moderate thickening of the aortic valve.  Aortic valve regurgitation is not visualized. Mild aortic valve stenosis. Aortic valve mean gradient measures 10.0 mmHg. Aortic valve Vmax measures 2.02 m/s. FINDINGS  Left Ventricle: Left ventricular ejection fraction, by estimation, is 50 to 55%. The left ventricle has low normal function. The left ventricle has no regional wall motion abnormalities. The left ventricular internal cavity size was normal in size. Right Ventricle: The right ventricular size is moderately enlarged. No increase in right ventricular wall thickness. Right ventricular systolic function is severely reduced. Left Atrium: Left atrial size was severely dilated. No left atrial/left atrial appendage thrombus was detected. Right Atrium: Right atrial size was moderately dilated. Prominent Eustachian valve. Pericardium: There is no evidence of pericardial effusion. Mitral Valve: Cannot exclude a tiny ruptured chord attached to the middle portion of the anterior leaflet, but the major cause of mitral insufficiency appears to be annular dilation and malcoaptation ("atrial functional MR"). The mitral valve is abnormal. Severe mitral valve regurgitation, with eccentric posteriorly directed jet. MV peak gradient, 8.2 mmHg. The mean mitral valve gradient is 4.2 mmHg. Tricuspid Valve: The tricuspid valve is normal in structure. Tricuspid valve regurgitation is mild. Aortic Valve: The aortic valve is tricuspid. There is moderate calcification of the aortic valve. There is moderate thickening of the aortic valve. Aortic valve regurgitation is not visualized. Mild aortic stenosis is present. Aortic valve mean gradient measures 10.0 mmHg. Aortic valve peak gradient measures 16.2 mmHg. Aortic valve area, by VTI measures 2.80 cm. Pulmonic Valve: The pulmonic valve was normal in structure. Pulmonic valve regurgitation is trivial. Aorta: The aortic root, ascending aorta, aortic arch and descending aorta are all structurally normal, with no evidence of  dilitation or obstruction. IAS/Shunts: No atrial level shunt detected by color flow Doppler. Additional Comments: Spectral Doppler performed. LEFT VENTRICLE PLAX 2D LVOT diam:     2.55 cm LV SV:         89 LV SV Index:   42 LVOT Area:     5.11 cm  AORTIC VALVE AV Area (Vmax):    2.58 cm AV Area (Vmean):   2.26 cm AV Area (VTI):     2.80 cm AV Vmax:  201.50 cm/s AV Vmean:          146.000 cm/s AV VTI:            0.317 m AV Peak Grad:      16.2 mmHg AV Mean Grad:      10.0 mmHg LVOT Vmax:         101.85 cm/s LVOT Vmean:        64.650 cm/s LVOT VTI:          0.174 m LVOT/AV VTI ratio: 0.55 MITRAL VALVE MV Area VTI:  4.32 cm        SHUNTS MV Peak grad: 8.2 mmHg        Systemic VTI:  0.17 m MV Mean grad: 4.2 mmHg        Systemic Diam: 2.55 cm MV Vmax:      1.43 m/s MV Vmean:     92.8 cm/s MR Peak grad:    80.6 mmHg MR Mean grad:    45.0 mmHg MR Vmax:         449.00 cm/s MR Vmean:        308.0 cm/s MR PISA:         6.93 cm MR PISA Eff ROA: 54 mm MR PISA Radius:  1.05 cm Dani Gobble Croitoru MD Electronically signed by Sanda Klein MD Signature Date/Time: 10/10/2022/7:59:45 PM    Final    CARDIAC CATHETERIZATION  Result Date: 10/10/2022 Successful TEER using 2 Pascal ACE devices, first device positioned A2/P2 and second device positioned A3/P3. MR reduced from 4+ at baseline to 1+ post-procedure. See full op note for details.   EP STUDY  Result Date: 10/10/2022 Successful TEER using 2 Pascal ACE devices, first device positioned A2/P2 and second device positioned A3/P3. MR reduced from 4+ at baseline to 1+ post-procedure. See full op note for details.    Medications:    Scheduled Medications:  apixaban  5 mg Oral BID   atorvastatin  80 mg Oral Daily   Chlorhexidine Gluconate Cloth  6 each Topical Daily   midodrine  2.5 mg Oral TID WC   potassium chloride  40 mEq Oral BID   senna-docusate  1 tablet Oral Daily   sodium chloride flush  3 mL Intravenous Q12H   sodium chloride flush  3 mL  Intravenous Q12H   spironolactone  12.5 mg Oral Daily   torsemide  80 mg Oral BID   umeclidinium bromide  1 puff Inhalation Daily   Infusions:  sodium chloride     sodium chloride     PRN Medications: sodium chloride, sodium chloride, acetaminophen, albuterol, guaiFENesin-dextromethorphan, morphine injection, ondansetron (ZOFRAN) IV, oxyCODONE, sodium chloride flush, sodium chloride flush  Patient Profile  Mr Dinan is 77 year old with a history of CAD, STEMI 2021, DES to LCx, permanent A fib, severe MR, HTN, HLD, sleep apnea, MR, chronic lymphedema, and HFpEF.    Admitted with A/C HFpEF.  Assessment/Plan  1. Acute on chronic HFpEF: With prominent RV dysfunction.  Echo this admission shows EF 50-55%, D-shaped septum, moderate-severe RV enlargement with moderate RV dysfunction, PASP 69, severe biatrial enlargement, moderate AS with mean gradient 21 mmHg/AVA 1.2 cm^2, IVC dilated. Diuretics held 2/26 w/ ABLA from rectus sheath hematoma. Had worsening HF/dyspnea>>120 mg IV Lasix + BiPAP. Lasix infusion started 2/28.  Initially on dobutamine for RV support, now off.  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  CI 2.6, PAPi adequate at  2.9.  - CO-OX stable. Volume status stable.  Overall down 90 pounds (today weight inaccurate, asked RN to do standing weight) - Continue po torsemide 80 bid (was on lasix 120 bid PTA) - Continue spironolactone 12.5 daily.  - On midodrine 2.5 tid, goal to d/c however BP still in 90s today.  2. Mitral regurgitation: Severe MR by echo.  TEE on 3/12 showed LV EF 55%, D-shaped septum, moderate RV dysfunction, severe functional MR (suspect atrial functional, also significant posterior leaflet restriction), mild TR, moderate aortic stenosis.  - s/p mTEER 3/20. 2 clips successfully placed MR +4>MR +1 -  RHC reviewed filling pressures up with mixed pre & post capillary  components.  3. AKI on CKD 3: Suspect cardiorenal in etiology.  - Scr 1.7 - Daily BMET 4. Atrial fibrillation: Permanent, has been seen by EP in the past. Not likely to successfully cardiovert and stay in NSR.  Eliquis restarted on 3/2 (held prior for rectus sheath hematoma from heparin).   Remains in A fib with controlled rate.  - Hep gtt now off, back on eliquis 5. CAD:   Had DES to mLCx in 10/21.  No s/s angina - Continue atorvastatin - No ASA with Eliquis use.  6. LE wounds: Followed as outpatient by Dr. Sharol Given.  ABIs normal this admission.  - Wound care - Improved 7. Left Rectus Sheath Hematoma: 15.0 x 7.4 x 14.7 cm hematoma on CT 2/26.  - Resolved 8. Anemia, acute blood loss: in setting of rectus sheath hematoma. Now stable.  - hgb stable, 9.9 9. Aortic stenosis: Moderate on TEE.  10. Dispo - Pt no longer candidate for CIR as he has significantly improved. Plan for [facility] PT/OT. Appears stable post procedure. May be ready for discharge today vs tomorrow.  11. Hypomag - Improved. 1.9 will supplement   Earnie Larsson AGACNP-BC  10/11/2022 8:14 AM  Patient seen and examined with the above-signed Advanced Practice Provider and/or Housestaff. I personally reviewed laboratory data, imaging studies and relevant notes. I independently examined the patient and formulated the important aspects of the plan. I have edited the note to reflect any of my changes or salient points. I have personally discussed the plan with the patient and/or family.  Successful mTEER yesterday. Residual 1+ MR. Feels good. Walking halls. Off inotropes.   General:  Sitting in chair  No resp difficulty HEENT: normal Neck: supple. no JVD. Carotids 2+ bilat; no bruits. No lymphadenopathy or thryomegaly appreciated. Cor: PMI nondisplaced. Regular rate & rhythm. No rubs, gallops or murmurs. Lungs: clear Abdomen: obese soft, nontender, nondistended. No hepatosplenomegaly. No bruits or masses. Good bowel  sounds. Extremities: no cyanosis, clubbing, rash, edema Neuro: alert & orientedx3, cranial nerves grossly intact. moves all 4 extremities w/o difficulty. Affect pleasant  S/p successful mTEER. Looks great. Will plan d/c with close HF fu/ today. Meds reviewed with pharmD.   Glori Bickers, MD  9:48 AM

## 2022-10-12 ENCOUNTER — Other Ambulatory Visit: Payer: Self-pay

## 2022-10-12 ENCOUNTER — Telehealth: Payer: Self-pay

## 2022-10-12 ENCOUNTER — Telehealth: Payer: Self-pay | Admitting: *Deleted

## 2022-10-12 DIAGNOSIS — I351 Nonrheumatic aortic (valve) insufficiency: Secondary | ICD-10-CM | POA: Diagnosis not present

## 2022-10-12 DIAGNOSIS — E785 Hyperlipidemia, unspecified: Secondary | ICD-10-CM | POA: Diagnosis not present

## 2022-10-12 DIAGNOSIS — I361 Nonrheumatic tricuspid (valve) insufficiency: Secondary | ICD-10-CM | POA: Diagnosis not present

## 2022-10-12 DIAGNOSIS — Z9889 Other specified postprocedural states: Secondary | ICD-10-CM

## 2022-10-12 DIAGNOSIS — E1122 Type 2 diabetes mellitus with diabetic chronic kidney disease: Secondary | ICD-10-CM | POA: Diagnosis not present

## 2022-10-12 DIAGNOSIS — N1831 Chronic kidney disease, stage 3a: Secondary | ICD-10-CM | POA: Diagnosis not present

## 2022-10-12 DIAGNOSIS — I890 Lymphedema, not elsewhere classified: Secondary | ICD-10-CM | POA: Diagnosis not present

## 2022-10-12 DIAGNOSIS — I872 Venous insufficiency (chronic) (peripheral): Secondary | ICD-10-CM | POA: Diagnosis not present

## 2022-10-12 DIAGNOSIS — I2510 Atherosclerotic heart disease of native coronary artery without angina pectoris: Secondary | ICD-10-CM | POA: Diagnosis not present

## 2022-10-12 DIAGNOSIS — E8881 Metabolic syndrome: Secondary | ICD-10-CM | POA: Diagnosis not present

## 2022-10-12 DIAGNOSIS — L97821 Non-pressure chronic ulcer of other part of left lower leg limited to breakdown of skin: Secondary | ICD-10-CM | POA: Diagnosis not present

## 2022-10-12 DIAGNOSIS — E1151 Type 2 diabetes mellitus with diabetic peripheral angiopathy without gangrene: Secondary | ICD-10-CM | POA: Diagnosis not present

## 2022-10-12 DIAGNOSIS — Z7901 Long term (current) use of anticoagulants: Secondary | ICD-10-CM | POA: Diagnosis not present

## 2022-10-12 DIAGNOSIS — G4733 Obstructive sleep apnea (adult) (pediatric): Secondary | ICD-10-CM | POA: Diagnosis not present

## 2022-10-12 DIAGNOSIS — I130 Hypertensive heart and chronic kidney disease with heart failure and stage 1 through stage 4 chronic kidney disease, or unspecified chronic kidney disease: Secondary | ICD-10-CM | POA: Diagnosis not present

## 2022-10-12 DIAGNOSIS — E876 Hypokalemia: Secondary | ICD-10-CM | POA: Diagnosis not present

## 2022-10-12 DIAGNOSIS — D62 Acute posthemorrhagic anemia: Secondary | ICD-10-CM | POA: Diagnosis not present

## 2022-10-12 DIAGNOSIS — I252 Old myocardial infarction: Secondary | ICD-10-CM | POA: Diagnosis not present

## 2022-10-12 DIAGNOSIS — Z87891 Personal history of nicotine dependence: Secondary | ICD-10-CM | POA: Diagnosis not present

## 2022-10-12 DIAGNOSIS — E1169 Type 2 diabetes mellitus with other specified complication: Secondary | ICD-10-CM

## 2022-10-12 DIAGNOSIS — I5042 Chronic combined systolic (congestive) and diastolic (congestive) heart failure: Secondary | ICD-10-CM

## 2022-10-12 DIAGNOSIS — N179 Acute kidney failure, unspecified: Secondary | ICD-10-CM | POA: Diagnosis not present

## 2022-10-12 DIAGNOSIS — Z7951 Long term (current) use of inhaled steroids: Secondary | ICD-10-CM | POA: Diagnosis not present

## 2022-10-12 DIAGNOSIS — Z7982 Long term (current) use of aspirin: Secondary | ICD-10-CM | POA: Diagnosis not present

## 2022-10-12 DIAGNOSIS — I4821 Permanent atrial fibrillation: Secondary | ICD-10-CM | POA: Diagnosis not present

## 2022-10-12 DIAGNOSIS — I5033 Acute on chronic diastolic (congestive) heart failure: Secondary | ICD-10-CM | POA: Diagnosis not present

## 2022-10-12 NOTE — Transitions of Care (Post Inpatient/ED Visit) (Signed)
   10/12/2022  Name: Ryan Garner MRN: [id number] DOB: Dec 14, 1945  Today's TOC FU Call Status: Today's TOC FU Call Status:: Successful TOC FU Call Competed TOC FU Call Complete Date: 10/12/22  Transition Care Management Follow-up Telephone Call Date of Discharge: 10/11/22 Discharge [facility] Type of Discharge: Inpatient Admission Primary Inpatient Discharge Diagnosis:: Acute on Chronic Diastolic Heart Failure How have you been since you were released from the hospital?: Better Any questions or concerns?: No  Items Reviewed: Did you receive and understand the discharge instructions provided?: Yes Medications obtained and verified?: Yes (Medications Reviewed) Any new allergies since your discharge?: No Dietary orders reviewed?: Yes Type of Diet Ordered:: Low sodium, Heart Healthy Do you have support at home?: Yes People in Home: child(ren), adult Name of Support/Comfort Primary Source: [facility] and Equipment/Supplies: [place] Ordered?: Yes Name of [place]:: [place] set up a time to come to your home?: Yes [place] Visit Date: 10/12/22 Any new equipment or medical supplies ordered?: No  Functional Questionnaire: Do you need assistance with bathing/showering or dressing?: Yes Do you need assistance with meal preparation?: Yes Do you need assistance with eating?: No Do you have difficulty maintaining continence: No Do you need assistance with getting out of bed/getting out of a chair/moving?: Yes Do you have difficulty managing or taking your medications?: No  Follow up appointments reviewed: PCP Follow-up appointment confirmed?: Yes Date of PCP follow-up appointment?: 10/15/22 Follow-up Provider: [facility] Follow-up appointment confirmed?: Yes Date of Specialist follow-up appointment?: 10/29/22 Follow-Up Specialty Provider:: Dr. Sharol Given Do you need  transportation to your follow-up appointment?: No Do you understand care options if your condition(s) worsen?: Yes-patient verbalized understanding  SDOH Interventions Today    Flowsheet Row Most Recent Value  SDOH Interventions   Food Insecurity Interventions Intervention Not Indicated  Housing Interventions Intervention Not Indicated      Interventions Today    Flowsheet Row Most Recent Value  Chronic Disease   Chronic disease during today's visit Congestive Heart Failure (CHF)  General Interventions   General Interventions Discussed/Reviewed General Interventions Discussed, Referral to Nurse  Education Interventions   Education Provided Provided Education  [Discussed daily weights and to notify provider of any weight gain over 3 lbs]       Johnney Killian, RN, BSN, CCM Care Management Coordinator [place] Phone: [phone]: [phone]

## 2022-10-12 NOTE — Progress Notes (Signed)
  Care Coordination   Note   10/12/2022 Name: RAEBURN TOKUDA MRN: [id number] DOB: [birth year]  OMAREE THIELBAR is a 76 y.o. year old male who sees [name], [name], MD for primary care. I reached out to [name] by phone today to offer care coordination services.  Mr. Samberg was given information about Care Coordination services today including:   The Care Coordination services include support from the care team which includes your Nurse Coordinator, Clinical Social Worker, or Pharmacist.  The Care Coordination team is here to help remove barriers to the health concerns and goals most important to you. Care Coordination services are voluntary, and the patient may decline or stop services at any time by request to their care team member.   Care Coordination Consent Status: Patient agreed to services and verbal consent obtained.   Follow up plan:  Telephone appointment with care coordination team member scheduled for:  10/19/22  Encounter Outcome:  Pt. Scheduled  [place]  Direct Dial: [phone]

## 2022-10-12 NOTE — Progress Notes (Signed)
  Care Coordination  Note  10/12/2022 Name: KASHMIR SONDEREGGER MRN: [id number] DOB: 10-01-1945  ADARRIUS LESKO is a 77 y.o. year old primary care patient of [name], [name], MD. I reached out to [name] by phone today to assist with scheduling a follow up appointment. [name] verbally consented to my assistance.       Follow up plan: Hospital Follow Up appointment scheduled with [facility] 10/15/22 at [place]: [phone]

## 2022-10-12 NOTE — Telephone Encounter (Signed)
Left message to call back  

## 2022-10-12 NOTE — Consult Note (Signed)
   [facility] CM Inpatient Consult   10/12/2022  SHAKER SARKER May 17, 1946 [id number]  Follow up:  Active  Patient transitioned home with [facility]. Will update [facility] LCSW of follow up needs.  Natividad Brood, RN BSN [place]  [phone] business mobile phone Toll free office [phone]  *[place]  [phone] Fax number: [phone] [email or web address]

## 2022-10-13 ENCOUNTER — Telehealth: Payer: Self-pay | Admitting: Physician Assistant

## 2022-10-13 NOTE — Telephone Encounter (Signed)
Son called in stating he has gained 5 lbs overnight. He is taking 80 mg torsemide BID. He states he hasn't had a BM in 3 days. He denies SOB, lower extremity swelling, orthopnea. Pt does not think he is retaining fluid, his main complaint is constipation. Son will administer senokot. I also advised miralax if the issue persisted. We discussed ER precautions and when to call back. I did advise an additional torsemide 20 mg this morning. Ryan Garner, son, expressed understanding of the plan.   Tami Lin Daeshon Grammatico, PA-C 10/13/2022, 11:05 AM [phone]

## 2022-10-15 ENCOUNTER — Ambulatory Visit: Payer: Self-pay | Admitting: *Deleted

## 2022-10-15 ENCOUNTER — Encounter: Payer: Self-pay | Admitting: Nurse Practitioner

## 2022-10-15 ENCOUNTER — Ambulatory Visit (INDEPENDENT_AMBULATORY_CARE_PROVIDER_SITE_OTHER): Payer: Medicare Other | Admitting: Nurse Practitioner

## 2022-10-15 VITALS — BP 125/79 | HR 88 | Temp 96.6°F | Resp 20 | Ht 68.0 in | Wt 216.0 lb

## 2022-10-15 DIAGNOSIS — I5033 Acute on chronic diastolic (congestive) heart failure: Secondary | ICD-10-CM | POA: Diagnosis not present

## 2022-10-15 DIAGNOSIS — I872 Venous insufficiency (chronic) (peripheral): Secondary | ICD-10-CM | POA: Diagnosis not present

## 2022-10-15 DIAGNOSIS — I5023 Acute on chronic systolic (congestive) heart failure: Secondary | ICD-10-CM | POA: Diagnosis not present

## 2022-10-15 DIAGNOSIS — Z7689 Persons encountering health services in other specified circumstances: Secondary | ICD-10-CM

## 2022-10-15 DIAGNOSIS — N1831 Chronic kidney disease, stage 3a: Secondary | ICD-10-CM | POA: Diagnosis not present

## 2022-10-15 DIAGNOSIS — E1151 Type 2 diabetes mellitus with diabetic peripheral angiopathy without gangrene: Secondary | ICD-10-CM | POA: Diagnosis not present

## 2022-10-15 DIAGNOSIS — E1122 Type 2 diabetes mellitus with diabetic chronic kidney disease: Secondary | ICD-10-CM | POA: Diagnosis not present

## 2022-10-15 DIAGNOSIS — I130 Hypertensive heart and chronic kidney disease with heart failure and stage 1 through stage 4 chronic kidney disease, or unspecified chronic kidney disease: Secondary | ICD-10-CM | POA: Diagnosis not present

## 2022-10-15 NOTE — Progress Notes (Signed)
Subjective:    Patient ID: Ryan Garner, male    DOB: 09/13/1945, 77 y.o.   MRN: [id number]   Chief Complaint: Transitions Of Care   HPI Today's visit was for Transitional Care Management.  The patient was discharged from [facility] on 10/11/22 with a primary diagnosis of acute on chronic diastolic heart failure.   Contact with the patient and/or caregiver, by a clinical staff member, was made on 10/12/22 and was documented as a telephone encounter within the EMR.  Through chart review and discussion with the patient I have determined that management of their condition is of moderate complexity.   Patient was admitted on 09/11/22 with shortness of breath and weight gain and edema. He has been home from the hospital on 09/2122. Since discharge he is doing much better. No SOB. He is trying to build his strength back up. He has actually lost 90lbs since February. Wt Readings from Last 3 Encounters:  10/15/22 216 lb (98 kg)  10/11/22 225 lb 12 oz (102.4 kg)  09/11/22 (!) 310 lb 6.4 oz (140.8 kg)          Patient Active Problem List   Diagnosis Date Noted   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   Acute on chronic systolic (congestive) heart failure ([place]) 09/17/2022   Acute on chronic diastolic heart failure ([place]) 09/12/2022   Pulmonary hypertension ([place]) 07/13/2022   OSA (obstructive sleep apnea) 07/13/2022   Lymphedema 07/13/2022   Severe mitral insufficiency    Aortic valve stenosis, nonrheumatic    Hypotension    Transient hypotension 04/06/2022   Bradycardia 04/06/2022   (HFpEF) heart failure with preserved ejection fraction ([place]) 04/06/2022   Abscess of leg, left 04/04/2022   Cellulitis of left lower extremity 03/02/2022   Chronic diastolic CHF (congestive heart failure) ([place]) 03/02/2022   Cellulitis of left leg 03/02/2022   Foot callus 12/15/2021   Other secondary pulmonary hypertension ([place]) 08/30/2021   COPD (chronic obstructive pulmonary disease) ([place])  08/30/2021   Coronary artery disease due to type 2 diabetes mellitus ([place]) 07/21/2020   CHF (congestive heart failure) ([place]) 07/21/2020   History of ST elevation myocardial infarction (STEMI) 04/30/2020   Atrial fibrillation ([place]) 02/29/2020   AKI (acute kidney injury) ([place]) 02/28/2020   Hypokalemia 02/28/2020   Hyponatremia 02/28/2020   Lumbar radiculopathy 03/24/2019   Morbid obesity ([place]) 09/04/2018   Lung nodules 01/17/2017   Ascending aortic aneurysm 01/17/2017   Type 2 diabetes mellitus ([place]) 03/20/2016   Tear of lateral meniscus of right knee 11/19/2014   Hypertension    Hyperlipidemia    Metabolic syndrome    Osteoarthritis of both knees 07/06/2013   Acquired spondylolisthesis 03/06/2013   Degeneration of lumbar intervertebral disc 03/06/2013   Lumbar spondylosis 03/06/2013   Spinal stenosis of lumbar region 03/06/2013       Review of Systems  Constitutional:  Negative for diaphoresis.  Eyes:  Negative for pain.  Respiratory:  Negative for shortness of breath.   Cardiovascular:  Negative for chest pain, palpitations and leg swelling.  Gastrointestinal:  Negative for abdominal pain.  Endocrine: Negative for polydipsia.  Skin:  Negative for rash.  Neurological:  Negative for dizziness, weakness and headaches.  Hematological:  Does not bruise/bleed easily.  All other systems reviewed and are negative.      Objective:   Physical Exam Vitals and nursing note reviewed.  Constitutional:      Appearance: Normal appearance. He is well-developed.  HENT:     Head:  Normocephalic.     Nose: Nose normal.     Mouth/Throat:     Mouth: Mucous membranes are moist.     Pharynx: Oropharynx is clear.  Eyes:     Pupils: Pupils are equal, round, and reactive to light.  Neck:     Thyroid: No thyroid mass or thyromegaly.     Vascular: No carotid bruit or JVD.     Trachea: Phonation normal.  Cardiovascular:     Rate and Rhythm: Normal rate and regular rhythm.     Heart  sounds: Murmur (2/6) heard.  Pulmonary:     Effort: Pulmonary effort is normal. No respiratory distress.     Breath sounds: Normal breath sounds.  Abdominal:     General: Bowel sounds are normal.     Palpations: Abdomen is soft.     Tenderness: There is no abdominal tenderness.  Musculoskeletal:        General: Normal range of motion.     Cervical back: Normal range of motion and neck supple.  Lymphadenopathy:     Cervical: No cervical adenopathy.  Skin:    General: Skin is warm and dry.  Neurological:     Mental Status: He is alert and oriented to person, place, and time.  Psychiatric:        Behavior: Behavior normal.        Thought Content: Thought content normal.        Judgment: Judgment normal.    BP 125/79   Pulse 88   Temp (!) 96.6 F (35.9 C) (Temporal)   Resp 20   Ht 5\' 8"  (1.727 m)   Wt 216 lb (98 kg)   SpO2 100%   BMI 32.84 kg/m         Assessment & Plan:   .Ryan Garner in today with chief complaint of Transitions Of Care   1. Encounter for support and coordination of transition of care Hospital record reviewed  2. Acute on chronic systolic heart failure (HCC) Continue to weigh daily and report a 3 lb weight gain in 24 hours Report SOB or edema    The above assessment and management plan was discussed with the patient. The patient verbalized understanding of and has agreed to the management plan. Patient is aware to call the clinic if symptoms persist or worsen. Patient is aware when to return to the clinic for a follow-up visit. Patient educated on when it is appropriate to go to the emergency department.   Mary-Margaret Hassell Done, FNP

## 2022-10-15 NOTE — Telephone Encounter (Signed)
  [facility]   Structural Heart Team  Contacted the patient regarding discharge from [facility] on 10/11/2022  The patient understands to follow up with the HF Team on 10/23/2022 at 1330.  The patient understands discharge instructions? Yes  The patient understands medications and regimen? Yes   The patient reports groin sites look healthy with no S/S of infection or bleeding.  The patient understands to call with any questions or concerns prior to scheduled visit. Marland Kitchen

## 2022-10-15 NOTE — Chronic Care Management (AMB) (Signed)
   10/15/2022  Ryan Garner 1946-04-16 [id number]   Enrollment status changed to previously enrolled.  Jacqlyn Larsen RNC, BSN RN Case Manager [place] [phone]

## 2022-10-17 ENCOUNTER — Telehealth (HOSPITAL_COMMUNITY): Payer: Self-pay | Admitting: Licensed Clinical Social Worker

## 2022-10-17 DIAGNOSIS — I5033 Acute on chronic diastolic (congestive) heart failure: Secondary | ICD-10-CM | POA: Diagnosis not present

## 2022-10-17 DIAGNOSIS — N1831 Chronic kidney disease, stage 3a: Secondary | ICD-10-CM | POA: Diagnosis not present

## 2022-10-17 DIAGNOSIS — I872 Venous insufficiency (chronic) (peripheral): Secondary | ICD-10-CM | POA: Diagnosis not present

## 2022-10-17 DIAGNOSIS — E1151 Type 2 diabetes mellitus with diabetic peripheral angiopathy without gangrene: Secondary | ICD-10-CM | POA: Diagnosis not present

## 2022-10-17 DIAGNOSIS — E1122 Type 2 diabetes mellitus with diabetic chronic kidney disease: Secondary | ICD-10-CM | POA: Diagnosis not present

## 2022-10-17 DIAGNOSIS — I130 Hypertensive heart and chronic kidney disease with heart failure and stage 1 through stage 4 chronic kidney disease, or unspecified chronic kidney disease: Secondary | ICD-10-CM | POA: Diagnosis not present

## 2022-10-17 NOTE — Telephone Encounter (Signed)
CSW consulted to enroll pt in [facility] program.  CSW called pt to discuss- unable to reach- left VM requesting return call  Jorge Ny, [place] Worker [facility] Desk#: [phone] Cell#: [phone]

## 2022-10-19 ENCOUNTER — Ambulatory Visit: Payer: Self-pay | Admitting: *Deleted

## 2022-10-19 ENCOUNTER — Telehealth (HOSPITAL_COMMUNITY): Payer: Self-pay | Admitting: Licensed Clinical Social Worker

## 2022-10-19 NOTE — Patient Outreach (Signed)
  Care Coordination   Initial Visit Note   09/04/2023 late entry for 10/19/22 Name: Ryan Garner MRN: [id number] DOB: [birth year]  Ryan Garner is a 77 y.o. year old male who sees [name], [name], MD for primary care. I spoke with  [name] by phone today.  What matters to the patients health and wellness today?  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 Denies any concerns post hospital      Goals Addressed             This Visit's Progress    patient will be ableto manage his congestive heart failure & other cardiac illnesses at home- care coordination RN CM   On track    Interventions Today    Flowsheet Row Most Recent Value  Chronic Disease   Chronic disease during today's visit Congestive Heart Failure (CHF), Hypertension (HTN), Atrial Fibrillation (AFib)  General Interventions   General Interventions Discussed/Reviewed General Interventions Discussed, Doctor Visits, Community Resources  Doctor Visits Discussed/Reviewed Doctor Visits Discussed, PCP  PCP/Specialist Visits Compliance with follow-up visit  Exercise Interventions   Exercise Discussed/Reviewed Exercise Discussed, Physical Activity, Weight Managment  Physical Activity Discussed/Reviewed Physical Activity Discussed  Weight Management Weight maintenance  Mental Health Interventions   Mental Health Discussed/Reviewed Mental Health Discussed, Coping Strategies  Nutrition Interventions   Nutrition Discussed/Reviewed Nutrition Discussed, Fluid intake, Decreasing salt  Pharmacy Interventions   Pharmacy Dicussed/Reviewed Pharmacy Topics Discussed  Safety Interventions   Safety Discussed/Reviewed Safety Discussed, Home Safety  Home Safety Assistive Devices  Advanced Directive Interventions   Advanced Directives Discussed/Reviewed Advanced  Directives Discussed              SDOH assessments and interventions completed:  Yes  SDOH Interventions Today    Flowsheet Row Most Recent Value  SDOH Interventions   Food Insecurity Interventions Intervention Not Indicated  Transportation Interventions Intervention Not Indicated  Utilities Interventions Intervention Not Indicated  Financial Strain Interventions Intervention Not Indicated  Stress Interventions Intervention Not Indicated        Care Coordination Interventions:  Yes, provided   Follow up plan: Follow up call scheduled for 10/09/23    Encounter Outcome:  Pt. Visit Completed   Yarexi Pawlicki L. Noelle Penner, RN, BSN, CCM [facility] Care Management Community Coordinator Office number [phone]

## 2022-10-19 NOTE — Telephone Encounter (Signed)
H&V Care Navigation CSW Progress Note  Clinical Social Worker consulted to refer pt to [place] with pt and received permission to send referral to [facility] for follow up- fax confirmation received.   SDOH Screenings   Food Insecurity: No Food Insecurity (10/14/2022)  Housing: Low Risk  (10/14/2022)  Transportation Needs: No Transportation Needs (10/14/2022)  Utilities: Not At Risk (10/07/2022)  Alcohol Screen: Low Risk  (07/04/2022)  Depression (PHQ2-9): Low Risk  (10/15/2022)  Financial Resource Strain: Low Risk  (10/14/2022)  Physical Activity: Inactive (10/14/2022)  Social Connections: Moderately Integrated (10/14/2022)  Stress: No Stress Concern Present (10/14/2022)  Tobacco Use: Medium Risk (10/15/2022)    Jorge Ny, LCSW Clinical Social Worker [facility] Desk#: [phone] Cell#: [phone]

## 2022-10-22 DIAGNOSIS — N1831 Chronic kidney disease, stage 3a: Secondary | ICD-10-CM | POA: Diagnosis not present

## 2022-10-22 DIAGNOSIS — I872 Venous insufficiency (chronic) (peripheral): Secondary | ICD-10-CM | POA: Diagnosis not present

## 2022-10-22 DIAGNOSIS — I5033 Acute on chronic diastolic (congestive) heart failure: Secondary | ICD-10-CM | POA: Diagnosis not present

## 2022-10-22 DIAGNOSIS — E1151 Type 2 diabetes mellitus with diabetic peripheral angiopathy without gangrene: Secondary | ICD-10-CM | POA: Diagnosis not present

## 2022-10-22 DIAGNOSIS — E1122 Type 2 diabetes mellitus with diabetic chronic kidney disease: Secondary | ICD-10-CM | POA: Diagnosis not present

## 2022-10-22 DIAGNOSIS — I130 Hypertensive heart and chronic kidney disease with heart failure and stage 1 through stage 4 chronic kidney disease, or unspecified chronic kidney disease: Secondary | ICD-10-CM | POA: Diagnosis not present

## 2022-10-23 ENCOUNTER — Encounter (HOSPITAL_COMMUNITY): Payer: Self-pay

## 2022-10-23 ENCOUNTER — Ambulatory Visit (HOSPITAL_COMMUNITY)
Admission: RE | Admit: 2022-10-23 | Discharge: 2022-10-23 | Disposition: A | Discharge status: Home / Self Care | Payer: Medicare Other | Source: Ambulatory Visit | Attending: Family Medicine | Admitting: Family Medicine

## 2022-10-23 VITALS — BP 124/90 | HR 90 | Wt 221.0 lb

## 2022-10-23 DIAGNOSIS — Z79899 Other long term (current) drug therapy: Secondary | ICD-10-CM | POA: Insufficient documentation

## 2022-10-23 DIAGNOSIS — Z7901 Long term (current) use of anticoagulants: Secondary | ICD-10-CM | POA: Diagnosis not present

## 2022-10-23 DIAGNOSIS — I2721 Secondary pulmonary arterial hypertension: Secondary | ICD-10-CM | POA: Diagnosis not present

## 2022-10-23 DIAGNOSIS — I50812 Chronic right heart failure: Secondary | ICD-10-CM

## 2022-10-23 DIAGNOSIS — I2510 Atherosclerotic heart disease of native coronary artery without angina pectoris: Secondary | ICD-10-CM | POA: Diagnosis not present

## 2022-10-23 DIAGNOSIS — I4821 Permanent atrial fibrillation: Secondary | ICD-10-CM | POA: Insufficient documentation

## 2022-10-23 DIAGNOSIS — E1122 Type 2 diabetes mellitus with diabetic chronic kidney disease: Secondary | ICD-10-CM | POA: Diagnosis not present

## 2022-10-23 DIAGNOSIS — I083 Combined rheumatic disorders of mitral, aortic and tricuspid valves: Secondary | ICD-10-CM | POA: Insufficient documentation

## 2022-10-23 DIAGNOSIS — G4730 Sleep apnea, unspecified: Secondary | ICD-10-CM | POA: Diagnosis not present

## 2022-10-23 DIAGNOSIS — I340 Nonrheumatic mitral (valve) insufficiency: Secondary | ICD-10-CM | POA: Diagnosis not present

## 2022-10-23 DIAGNOSIS — N179 Acute kidney failure, unspecified: Secondary | ICD-10-CM | POA: Insufficient documentation

## 2022-10-23 DIAGNOSIS — I350 Nonrheumatic aortic (valve) stenosis: Secondary | ICD-10-CM | POA: Diagnosis not present

## 2022-10-23 DIAGNOSIS — I5032 Chronic diastolic (congestive) heart failure: Secondary | ICD-10-CM | POA: Diagnosis not present

## 2022-10-23 DIAGNOSIS — E785 Hyperlipidemia, unspecified: Secondary | ICD-10-CM | POA: Diagnosis not present

## 2022-10-23 DIAGNOSIS — I130 Hypertensive heart and chronic kidney disease with heart failure and stage 1 through stage 4 chronic kidney disease, or unspecified chronic kidney disease: Secondary | ICD-10-CM | POA: Diagnosis not present

## 2022-10-23 DIAGNOSIS — N1832 Chronic kidney disease, stage 3b: Secondary | ICD-10-CM | POA: Diagnosis not present

## 2022-10-23 DIAGNOSIS — I252 Old myocardial infarction: Secondary | ICD-10-CM | POA: Insufficient documentation

## 2022-10-23 LAB — CBC
HCT: 34.7 % — ABNORMAL LOW (ref 39.0–52.0)
Hemoglobin: 11.5 g/dL — ABNORMAL LOW (ref 13.0–17.0)
MCH: 29.8 pg (ref 26.0–34.0)
MCHC: 33.1 g/dL (ref 30.0–36.0)
MCV: 89.9 fL (ref 80.0–100.0)
Platelets: 254 10*3/uL (ref 150–400)
RBC: 3.86 MIL/uL — ABNORMAL LOW (ref 4.22–5.81)
RDW: 15.5 % (ref 11.5–15.5)
WBC: 9.2 10*3/uL (ref 4.0–10.5)
nRBC: 0 % (ref 0.0–0.2)

## 2022-10-23 LAB — BASIC METABOLIC PANEL
Anion gap: 12 (ref 5–15)
BUN: 60 mg/dL — ABNORMAL HIGH (ref 8–23)
CO2: 28 mmol/L (ref 22–32)
Calcium: 10 mg/dL (ref 8.9–10.3)
Chloride: 91 mmol/L — ABNORMAL LOW (ref 98–111)
Creatinine, Ser: 1.55 mg/dL — ABNORMAL HIGH (ref 0.61–1.24)
GFR, Estimated: 46 mL/min — ABNORMAL LOW (ref >60)
Glucose, Bld: 105 mg/dL — ABNORMAL HIGH (ref 70–99)
Potassium: 4.3 mmol/L (ref 3.5–5.1)
Sodium: 131 mmol/L — ABNORMAL LOW (ref 135–145)

## 2022-10-23 NOTE — Patient Instructions (Signed)
EKG done today.  Labs done today. We will contact you only if your labs are abnormal.  No medication changes were made. Please continue all current medications as prescribed.  Your physician recommends that you schedule a follow-up appointment in: 6-8 weeks with Dr. McLean  If you have any questions or concerns before your next appointment please send us a message through mychart or call our office at [phone].    TO LEAVE A MESSAGE FOR THE NURSE SELECT OPTION 2, PLEASE LEAVE A MESSAGE INCLUDING: YOUR NAME DATE OF BIRTH CALL BACK NUMBER REASON FOR CALL**this is important as we prioritize the call backs  YOU WILL RECEIVE A CALL BACK THE SAME DAY AS LONG AS YOU CALL BEFORE 4:00 PM   Do the following things EVERYDAY: Weigh yourself in the morning before breakfast. Write it down and keep it in a log. Take your medicines as prescribed Eat low salt foods--Limit salt (sodium) to 2000 mg per day.  Stay as active as you can everyday Limit all fluids for the day to less than 2 liters   At the [facility], you and your health needs are our priority. As part of our continuing mission to provide you with exceptional heart care, we have created designated Provider Care Teams. These Care Teams include your primary Cardiologist (physician) and Advanced Practice Providers (APPs- Physician Assistants and Nurse Practitioners) who all work together to provide you with the care you need, when you need it.   You may see any of the following providers on your designated Care Team at your next follow up: Dr Daniel Bensimhon Dr Dalton McLean Amy Clegg, NP Brittainy Simmons, PA Lauren Kemp, PharmD   Please be sure to bring in all your medications bottles to every appointment.   

## 2022-10-23 NOTE — Progress Notes (Signed)
Advanced Heart Failure Clinic Note   Referring Physician: PCP: Garner, Ryan Kaufmann, MD PCP-Cardiologist: Ryan Carnes, MD   HPI:  [name] is 77 year old with a history of CAD, STEMI 2021, DES to LCx, permanent A fib, severe MR, HTN, HLD, sleep apnea, chronic lymphedema, and HFpEF.    Admitted 2/24 with A/C HFpEF/ a/c RV failure . Echo showed EF 50-55%, D-shaped septum, moderate-severe RV enlargement with moderate RV dysfunction, PASP 69, severe biatrial enlargement, severe mitral regurgitation with restricted posterior mitral leaflet. EROA 0.51cm2,  RVol 64 mL, moderate AS with mean gradient 21 mmHg/AVA 1.2 cm^2, IVC dilated. Diuresed with IV lasix with poor response, hypotension and worsening renal function. Dobutamine added for RV support. Diuresis improved. Overall diuresed > 90 pounds. Transitioned to torsemide 80 mg twice daily.    Hospitalization c/b development of acute abdominal pain. Sent for stat CT and this showed rectus sheath hematoma. Anticoagulants held w/ gradual improvement. Anticoagulants ultimately restarted. Hemoglobin stable.    Once stabilized, he underwent TEE on to further assess his MR. This showed LV EF 55%, D-shaped septum, moderate RV dysfunction, severe functional MR (suspect atrial functional, also significant posterior leaflet restriction), mild TR, moderate aortic stenosis. Structural heart team consulted. On 10/10/22 he underwent successful mTEER with residual 1+ MR. He was discharged home on 3/21. D/c wt 225 lb.  He presents today for post hospital f/u. Feels "much better and like a new person". Breathing improved. Able to get around and do basic ADLs w/o significant dyspnea. Limited more by obesity/deconditioning. No orthopnea/PND. Denies CP. EKG today shows chronic Afib, rate controlled. Wt stable at home and down 4 lb on clinic scale. Reports full med compliance. BP 124/90, continues on midodrine. No orthostatic symptoms.    Review of Systems: [y] = yes, [ ]   = no   General: Weight gain [ ] ; Weight loss [ ] ; Anorexia [ ] ; Fatigue [ ] ; Fever [ ] ; Chills [ ] ; Weakness [ ]   Cardiac: Chest pain/pressure [ ] ; Resting SOB [ ] ; Exertional SOB [ ] ; Orthopnea [ ] ; Pedal Edema [ Y]; Palpitations [ ] ; Syncope [ ] ; Presyncope [ ] ; Paroxysmal nocturnal dyspnea[ ]   Pulmonary: Cough [ ] ; Wheezing[ ] ; Hemoptysis[ ] ; Sputum [ ] ; Snoring [ ]   GI: Vomiting[ ] ; Dysphagia[ ] ; Melena[ ] ; Hematochezia [ ] ; Heartburn[ ] ; Abdominal pain [ ] ; Constipation [ ] ; Diarrhea [ ] ; BRBPR [ ]   GU: Hematuria[ ] ; Dysuria [ ] ; Nocturia[ ]   Vascular: Pain in legs with walking [ ] ; Pain in feet with lying flat [ ] ; Non-healing sores [ Y]; Stroke [ ] ; TIA [ ] ; Slurred speech [ ] ;  Neuro: Headaches[ ] ; Vertigo[ ] ; Seizures[ ] ; Paresthesias[ ] ;Blurred vision [ ] ; Diplopia [ ] ; Vision changes [ ]   Ortho/Skin: Arthritis [ ] ; Joint pain [ ] ; Muscle pain [ ] ; Joint swelling [ ] ; Back Pain [ ] ; Rash [ ]   Psych: Depression[ ] ; Anxiety[ ]   Heme: Bleeding problems [ ] ; Clotting disorders [ ] ; Anemia [ ]   Endocrine: Diabetes [ ] ; Thyroid dysfunction[ ]    Past Medical History:  Diagnosis Date   A-fib    Acute on chronic diastolic CHF (congestive heart failure) 07/21/2020   Coronary artery disease    Diabetes mellitus without complication    Dyspnea    Dysrhythmia    A-fib   Hyperlipidemia    Hypertension    Metabolic syndrome    Prediabetes    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 Burt Garner and Dr. Ali Garner   Sleep apnea    STEMI (ST elevation myocardial infarction)     Current Outpatient Medications  Medication Sig Dispense Refill   albuterol (VENTOLIN HFA) 108 (90 Base) MCG/ACT inhaler Inhale 2 puffs into the lungs every 6 (six) hours as needed for wheezing or shortness of breath. 8 g 0   apixaban (ELIQUIS) 5 MG TABS tablet Take 1 tablet (5 mg total) by mouth 2 (two) times daily. 180 tablet 1   ascorbic acid  (VITAMIN C) 1000 MG tablet Take 1 tablet (1,000 mg total) by mouth daily. 60 tablet 1   atorvastatin (LIPITOR) 80 MG tablet Take 1 tablet by mouth once daily 90 tablet 3   metolazone (ZAROXOLYN) 5 MG tablet Take 5 mg by mouth as needed.     metoprolol tartrate (LOPRESSOR) 50 MG tablet Take 50 mg by mouth 2 (two) times daily.     midodrine (PROAMATINE) 2.5 MG tablet Take 1 tablet (2.5 mg total) by mouth 3 (three) times daily with meals. 90 tablet 6   Multiple Vitamin (MULTIVITAMIN WITH MINERALS) TABS tablet Take 1 tablet by mouth daily.     nitroGLYCERIN (NITROSTAT) 0.4 MG SL tablet Place 1 tablet (0.4 mg total) under the tongue every 5 (five) minutes x 3 doses as needed for chest pain. 25 tablet 2   nutrition supplement, JUVEN, (JUVEN) PACK Take 1 packet by mouth 2 (two) times daily between meals. 60 packet 1   nystatin (MYCOSTATIN/NYSTOP) powder Apply 1 Application topically 3 (three) times daily. 15 g 0   potassium chloride SA (KLOR-CON M) 20 MEQ tablet Take 2 tablets (40 mEq total) by mouth daily.     spironolactone (ALDACTONE) 25 MG tablet Take 1/2 tablet (12.5 mg total) by mouth daily. 30 tablet 6   Tiotropium Bromide Monohydrate (SPIRIVA RESPIMAT) 1.25 MCG/ACT AERS Inhale 2 puffs into the lungs daily. 4 g 3   torsemide (DEMADEX) 20 MG tablet Take 4 tablets (80 mg total) by mouth 2 (two) times daily. 240 tablet 6   zinc sulfate 220 (50 Zn) MG capsule Take 1 capsule (220 mg total) by mouth daily. 60 capsule 0   No current facility-administered medications for this encounter.    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  Tobacco Use   Smoking status: Former    Types: Cigarettes    Quit date: 04/02/1959    Years since quitting: 63.6   Smokeless tobacco: Never  Vaping Use   Vaping Use: Never used  Substance and Sexual Activity   Alcohol use: No   Drug use: No   Sexual activity: Yes  Other Topics Concern   Not on file  Social History Narrative   Not on file   Social Determinants of Health   Financial Resource Strain: Low Risk  (10/19/2022)   Overall Financial Resource Strain (CARDIA)  Difficulty of Paying Living Expenses: Not hard at all  Food Insecurity: No Food Insecurity (10/19/2022)   Hunger Vital Sign    Worried About Running Out of Food in the Last Year: Never true    Ran Out of Food in the Last Year: Never true  Transportation Needs: No Transportation Needs (10/19/2022)   PRAPARE - Hydrologist (Medical): No    Lack of Transportation (Non-Medical): No  Physical Activity: Inactive (10/14/2022)   Exercise Vital Sign    Days of Exercise per Week: 0 days    Minutes of Exercise per Session: 30 min  Stress: No Stress Concern Present (10/19/2022)   [place]    Feeling of Stress : Not at all  Social Connections: Moderately Integrated (10/14/2022)   Social Connection and Isolation Panel [NHANES]    Frequency of Communication with Friends and Family: More than three times a week    Frequency of Social Gatherings with Friends and Family: Once a week    Attends Religious Services: More than 4 times per year    Active Member of [employer] or Organizations: Yes    Attends Archivist Meetings: More than 4 times per year    Marital Status: Widowed  Intimate Partner Violence: Not At Risk (10/07/2022)   Humiliation, Afraid, Rape, and Kick questionnaire    Fear of  Current or Ex-Partner: No    Emotionally Abused: No    Physically Abused: No    Sexually Abused: No      Family History  Problem Relation Age of Onset   Cancer Mother        lungs   Coronary artery disease Mother    Diabetes Brother    Heart attack Brother    Diabetes Maternal Grandmother    Arthritis Father     Vitals:   10/23/22 1337  BP: (!) 124/90  Pulse: 90  SpO2: 98%  Weight: 100.2 kg (221 lb)     PHYSICAL EXAM: General:  obese, elderly WM. Well appearing. No respiratory difficulty HEENT: normal Neck: supple. no JVD. Carotids 2+ bilat; no bruits. No lymphadenopathy or thyromegaly appreciated. Cor: PMI nondisplaced. Irregularly irregular rhythm and rate. 2/6 MR  Lungs: clear Abdomen: soft, nontender, nondistended. No hepatosplenomegaly. No bruits or masses. Good bowel sounds. Extremities: no cyanosis, clubbing, rash, trace-1+ b/l LE edema, Lt LE wrapped w/ ace bandage  Neuro: alert & oriented x 3, cranial nerves grossly intact. moves all 4 extremities w/o difficulty. Affect pleasant.  ECG: Chronic Afib 84 bpm    ASSESSMENT & PLAN:  1. Acute on chronic HFpEF: With prominent RV dysfunction. Recent hospitalization 2/24 w/ a/c CHF w/ volume overload w/ low output, requiring RV inotropic support w/ DBA to help w/ diuresis. Echo 2/24 showed EF 50-55%, D-shaped septum, moderate-severe RV enlargement with moderate RV dysfunction, PASP 69, severe biatrial enlargement, moderate AS with mean gradient 21 mmHg/AVA 1.2 cm^2, IVC dilated. 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 Weaned off DBA w/ stable co-ox. Underwent placement of MitraClip w/ significant reduction in MR. 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  - Stable NYHA Class II symptoms, though not very active at baseline.  - Volume ok on exam and wt stable. Continue  po torsemide 80 bid. Takes metolazone PRN - Continue spironolactone 12.5 daily.  - On midodrine 2.5  tid - check BMP and BNP today  - discusses continuation of daily wts and fluid restriction  - recommended TED hoses   2. Mitral regurgitation: Severe MR by echo 2/24.  TEE on 3/12 showed LV EF 55%, D-shaped septum, moderate RV dysfunction, severe functional MR (suspect atrial functional, also significant posterior leaflet restriction), mild TR, moderate aortic stenosis.  - s/p mTEER 3/20. 2 clips successfully placed MR +4>MR +1 - functional status much improved  - has f/u echo and clinic visit w/ structural heart team on 4/26   3. AKI on CKD 3: B/l SCr ~1.7 - check BMP today   4. Atrial fibrillation: Permanent, has been seen by EP in the past. Not likely to successfully cardiovert and stay in NSR.   - EKG today w/ CVR  - Continue Eliquis. Denies abnormal bleeding, check CBC today   5. CAD:   Had DES to mLCx in 10/21.  No s/s angina - Continue atorvastatin - No ASA with Eliquis use.   6. LE Wounds - Left LE wrapped, followed by wound care and Dr. Sharol Given    7. Left Rectus Sheath Hematoma w/ ABLA: 15.0 x 7.4 x 14.7 cm hematoma on CT 2/26.  Resolved. No further abdominal pain  - check f/u CBC today   9. Aortic stenosis: Moderate on TEE 3/24 - will follow. May ultimately need TAVR    F/u w/ Dr. Aundra Dubin in 6-8 wks. Advised to return to clinic sooner if development of wt gain, dyspnea or worsening leg edema.   Lyda Jester, PA-C 10/23/22

## 2022-10-24 DIAGNOSIS — E1122 Type 2 diabetes mellitus with diabetic chronic kidney disease: Secondary | ICD-10-CM | POA: Diagnosis not present

## 2022-10-24 DIAGNOSIS — I5033 Acute on chronic diastolic (congestive) heart failure: Secondary | ICD-10-CM | POA: Diagnosis not present

## 2022-10-24 DIAGNOSIS — I130 Hypertensive heart and chronic kidney disease with heart failure and stage 1 through stage 4 chronic kidney disease, or unspecified chronic kidney disease: Secondary | ICD-10-CM | POA: Diagnosis not present

## 2022-10-24 DIAGNOSIS — I872 Venous insufficiency (chronic) (peripheral): Secondary | ICD-10-CM | POA: Diagnosis not present

## 2022-10-24 DIAGNOSIS — E1151 Type 2 diabetes mellitus with diabetic peripheral angiopathy without gangrene: Secondary | ICD-10-CM | POA: Diagnosis not present

## 2022-10-24 DIAGNOSIS — N1831 Chronic kidney disease, stage 3a: Secondary | ICD-10-CM | POA: Diagnosis not present

## 2022-10-25 DIAGNOSIS — E1122 Type 2 diabetes mellitus with diabetic chronic kidney disease: Secondary | ICD-10-CM | POA: Diagnosis not present

## 2022-10-25 DIAGNOSIS — E1151 Type 2 diabetes mellitus with diabetic peripheral angiopathy without gangrene: Secondary | ICD-10-CM | POA: Diagnosis not present

## 2022-10-25 DIAGNOSIS — N1831 Chronic kidney disease, stage 3a: Secondary | ICD-10-CM | POA: Diagnosis not present

## 2022-10-25 DIAGNOSIS — I5033 Acute on chronic diastolic (congestive) heart failure: Secondary | ICD-10-CM | POA: Diagnosis not present

## 2022-10-25 DIAGNOSIS — I130 Hypertensive heart and chronic kidney disease with heart failure and stage 1 through stage 4 chronic kidney disease, or unspecified chronic kidney disease: Secondary | ICD-10-CM | POA: Diagnosis not present

## 2022-10-25 DIAGNOSIS — I872 Venous insufficiency (chronic) (peripheral): Secondary | ICD-10-CM | POA: Diagnosis not present

## 2022-10-29 ENCOUNTER — Ambulatory Visit: Payer: Medicare Other | Admitting: Orthopedic Surgery

## 2022-10-29 DIAGNOSIS — I5033 Acute on chronic diastolic (congestive) heart failure: Secondary | ICD-10-CM | POA: Diagnosis not present

## 2022-10-29 DIAGNOSIS — E1122 Type 2 diabetes mellitus with diabetic chronic kidney disease: Secondary | ICD-10-CM | POA: Diagnosis not present

## 2022-10-29 DIAGNOSIS — E1151 Type 2 diabetes mellitus with diabetic peripheral angiopathy without gangrene: Secondary | ICD-10-CM | POA: Diagnosis not present

## 2022-10-29 DIAGNOSIS — I872 Venous insufficiency (chronic) (peripheral): Secondary | ICD-10-CM | POA: Diagnosis not present

## 2022-10-29 DIAGNOSIS — N1831 Chronic kidney disease, stage 3a: Secondary | ICD-10-CM | POA: Diagnosis not present

## 2022-10-29 DIAGNOSIS — I130 Hypertensive heart and chronic kidney disease with heart failure and stage 1 through stage 4 chronic kidney disease, or unspecified chronic kidney disease: Secondary | ICD-10-CM | POA: Diagnosis not present

## 2022-10-30 ENCOUNTER — Inpatient Hospital Stay (HOSPITAL_COMMUNITY): Payer: Medicare Other

## 2022-10-30 ENCOUNTER — Ambulatory Visit (INDEPENDENT_AMBULATORY_CARE_PROVIDER_SITE_OTHER): Payer: Medicare Other | Admitting: Orthopedic Surgery

## 2022-10-30 DIAGNOSIS — I890 Lymphedema, not elsewhere classified: Secondary | ICD-10-CM

## 2022-10-30 DIAGNOSIS — L97921 Non-pressure chronic ulcer of unspecified part of left lower leg limited to breakdown of skin: Secondary | ICD-10-CM | POA: Diagnosis not present

## 2022-11-01 DIAGNOSIS — E1122 Type 2 diabetes mellitus with diabetic chronic kidney disease: Secondary | ICD-10-CM | POA: Diagnosis not present

## 2022-11-01 DIAGNOSIS — I872 Venous insufficiency (chronic) (peripheral): Secondary | ICD-10-CM | POA: Diagnosis not present

## 2022-11-01 DIAGNOSIS — I130 Hypertensive heart and chronic kidney disease with heart failure and stage 1 through stage 4 chronic kidney disease, or unspecified chronic kidney disease: Secondary | ICD-10-CM | POA: Diagnosis not present

## 2022-11-01 DIAGNOSIS — N1831 Chronic kidney disease, stage 3a: Secondary | ICD-10-CM | POA: Diagnosis not present

## 2022-11-01 DIAGNOSIS — E1151 Type 2 diabetes mellitus with diabetic peripheral angiopathy without gangrene: Secondary | ICD-10-CM | POA: Diagnosis not present

## 2022-11-01 DIAGNOSIS — I5033 Acute on chronic diastolic (congestive) heart failure: Secondary | ICD-10-CM | POA: Diagnosis not present

## 2022-11-02 DIAGNOSIS — I872 Venous insufficiency (chronic) (peripheral): Secondary | ICD-10-CM | POA: Diagnosis not present

## 2022-11-02 DIAGNOSIS — I130 Hypertensive heart and chronic kidney disease with heart failure and stage 1 through stage 4 chronic kidney disease, or unspecified chronic kidney disease: Secondary | ICD-10-CM | POA: Diagnosis not present

## 2022-11-02 DIAGNOSIS — E1151 Type 2 diabetes mellitus with diabetic peripheral angiopathy without gangrene: Secondary | ICD-10-CM | POA: Diagnosis not present

## 2022-11-02 DIAGNOSIS — E1122 Type 2 diabetes mellitus with diabetic chronic kidney disease: Secondary | ICD-10-CM | POA: Diagnosis not present

## 2022-11-02 DIAGNOSIS — N1831 Chronic kidney disease, stage 3a: Secondary | ICD-10-CM | POA: Diagnosis not present

## 2022-11-02 DIAGNOSIS — I5033 Acute on chronic diastolic (congestive) heart failure: Secondary | ICD-10-CM | POA: Diagnosis not present

## 2022-11-06 DIAGNOSIS — E1122 Type 2 diabetes mellitus with diabetic chronic kidney disease: Secondary | ICD-10-CM | POA: Diagnosis not present

## 2022-11-06 DIAGNOSIS — I130 Hypertensive heart and chronic kidney disease with heart failure and stage 1 through stage 4 chronic kidney disease, or unspecified chronic kidney disease: Secondary | ICD-10-CM | POA: Diagnosis not present

## 2022-11-06 DIAGNOSIS — I5033 Acute on chronic diastolic (congestive) heart failure: Secondary | ICD-10-CM | POA: Diagnosis not present

## 2022-11-06 DIAGNOSIS — E1151 Type 2 diabetes mellitus with diabetic peripheral angiopathy without gangrene: Secondary | ICD-10-CM | POA: Diagnosis not present

## 2022-11-06 DIAGNOSIS — I872 Venous insufficiency (chronic) (peripheral): Secondary | ICD-10-CM | POA: Diagnosis not present

## 2022-11-06 DIAGNOSIS — N1831 Chronic kidney disease, stage 3a: Secondary | ICD-10-CM | POA: Diagnosis not present

## 2022-11-08 ENCOUNTER — Other Ambulatory Visit: Payer: Self-pay | Admitting: Internal Medicine

## 2022-11-08 ENCOUNTER — Other Ambulatory Visit (HOSPITAL_COMMUNITY): Payer: Self-pay

## 2022-11-09 DIAGNOSIS — E1151 Type 2 diabetes mellitus with diabetic peripheral angiopathy without gangrene: Secondary | ICD-10-CM | POA: Diagnosis not present

## 2022-11-09 DIAGNOSIS — E1122 Type 2 diabetes mellitus with diabetic chronic kidney disease: Secondary | ICD-10-CM | POA: Diagnosis not present

## 2022-11-09 DIAGNOSIS — I5033 Acute on chronic diastolic (congestive) heart failure: Secondary | ICD-10-CM | POA: Diagnosis not present

## 2022-11-09 DIAGNOSIS — I130 Hypertensive heart and chronic kidney disease with heart failure and stage 1 through stage 4 chronic kidney disease, or unspecified chronic kidney disease: Secondary | ICD-10-CM | POA: Diagnosis not present

## 2022-11-09 DIAGNOSIS — N1831 Chronic kidney disease, stage 3a: Secondary | ICD-10-CM | POA: Diagnosis not present

## 2022-11-09 DIAGNOSIS — I872 Venous insufficiency (chronic) (peripheral): Secondary | ICD-10-CM | POA: Diagnosis not present

## 2022-11-11 ENCOUNTER — Encounter: Payer: Self-pay | Admitting: Orthopedic Surgery

## 2022-11-11 DIAGNOSIS — Z7982 Long term (current) use of aspirin: Secondary | ICD-10-CM | POA: Diagnosis not present

## 2022-11-11 DIAGNOSIS — E1151 Type 2 diabetes mellitus with diabetic peripheral angiopathy without gangrene: Secondary | ICD-10-CM | POA: Diagnosis not present

## 2022-11-11 DIAGNOSIS — I252 Old myocardial infarction: Secondary | ICD-10-CM | POA: Diagnosis not present

## 2022-11-11 DIAGNOSIS — G4733 Obstructive sleep apnea (adult) (pediatric): Secondary | ICD-10-CM | POA: Diagnosis not present

## 2022-11-11 DIAGNOSIS — I361 Nonrheumatic tricuspid (valve) insufficiency: Secondary | ICD-10-CM | POA: Diagnosis not present

## 2022-11-11 DIAGNOSIS — Z7901 Long term (current) use of anticoagulants: Secondary | ICD-10-CM | POA: Diagnosis not present

## 2022-11-11 DIAGNOSIS — E785 Hyperlipidemia, unspecified: Secondary | ICD-10-CM | POA: Diagnosis not present

## 2022-11-11 DIAGNOSIS — I2510 Atherosclerotic heart disease of native coronary artery without angina pectoris: Secondary | ICD-10-CM | POA: Diagnosis not present

## 2022-11-11 DIAGNOSIS — Z87891 Personal history of nicotine dependence: Secondary | ICD-10-CM | POA: Diagnosis not present

## 2022-11-11 DIAGNOSIS — I890 Lymphedema, not elsewhere classified: Secondary | ICD-10-CM | POA: Diagnosis not present

## 2022-11-11 DIAGNOSIS — D62 Acute posthemorrhagic anemia: Secondary | ICD-10-CM | POA: Diagnosis not present

## 2022-11-11 DIAGNOSIS — E876 Hypokalemia: Secondary | ICD-10-CM | POA: Diagnosis not present

## 2022-11-11 DIAGNOSIS — I5033 Acute on chronic diastolic (congestive) heart failure: Secondary | ICD-10-CM | POA: Diagnosis not present

## 2022-11-11 DIAGNOSIS — I872 Venous insufficiency (chronic) (peripheral): Secondary | ICD-10-CM | POA: Diagnosis not present

## 2022-11-11 DIAGNOSIS — N179 Acute kidney failure, unspecified: Secondary | ICD-10-CM | POA: Diagnosis not present

## 2022-11-11 DIAGNOSIS — I130 Hypertensive heart and chronic kidney disease with heart failure and stage 1 through stage 4 chronic kidney disease, or unspecified chronic kidney disease: Secondary | ICD-10-CM | POA: Diagnosis not present

## 2022-11-11 DIAGNOSIS — N1831 Chronic kidney disease, stage 3a: Secondary | ICD-10-CM | POA: Diagnosis not present

## 2022-11-11 DIAGNOSIS — Z7951 Long term (current) use of inhaled steroids: Secondary | ICD-10-CM | POA: Diagnosis not present

## 2022-11-11 DIAGNOSIS — I351 Nonrheumatic aortic (valve) insufficiency: Secondary | ICD-10-CM | POA: Diagnosis not present

## 2022-11-11 DIAGNOSIS — E8881 Metabolic syndrome: Secondary | ICD-10-CM | POA: Diagnosis not present

## 2022-11-11 DIAGNOSIS — I4821 Permanent atrial fibrillation: Secondary | ICD-10-CM | POA: Diagnosis not present

## 2022-11-11 DIAGNOSIS — L97821 Non-pressure chronic ulcer of other part of left lower leg limited to breakdown of skin: Secondary | ICD-10-CM | POA: Diagnosis not present

## 2022-11-11 DIAGNOSIS — E1122 Type 2 diabetes mellitus with diabetic chronic kidney disease: Secondary | ICD-10-CM | POA: Diagnosis not present

## 2022-11-11 NOTE — Progress Notes (Signed)
Office Visit Note   Patient: Ryan Garner           Date of Birth: 22-Feb-1946           MRN: [id number] Visit Date: 10/30/2022              Requested by: [name], [name], MD [address] [place],  [place] [address] PCP: [name], [name], MD  Chief Complaint  Patient presents with   Right Leg - Follow-up   Left Leg - Follow-up      HPI: Patient is a 77 year old gentleman presents in follow-up for venous and lymphatic insufficiency left lower extremity.  Currently using Ace and silver dressing with lymphedema pumps 1 hour a day.  Patient was able to walk in today using a cane.  Patient states that he is lost about 90 pounds.  Assessment & Plan: Visit Diagnoses:  1. Lymphedema of both lower extremities   2. Nonhealing ulcer of left lower leg limited to breakdown of skin     Plan: Continue with compression and exercise.  Follow-Up Instructions: Return in about 4 weeks (around 11/27/2022).   Ortho Exam  Patient is alert, oriented, no adenopathy, well-dressed, normal affect, normal respiratory effort. Examination patient has decreased swelling in the lower extremities there is superficial granulating wounds that were touched with silver nitrate left lower extremity.  Dry dressing and Ace compression wrap was applied.  Imaging: No results found. No images are attached to the encounter.  Labs: Lab Results  Component Value Date   HGBA1C 6.4 (H) 10/10/2022   HGBA1C 7.0 (H) 12/15/2021   HGBA1C 6.4 (H) 04/28/2021   REPTSTATUS 04/11/2022 FINAL 04/04/2022   GRAMSTAIN NO WBC SEEN NO ORGANISMS SEEN  04/04/2022   CULT  04/04/2022    RARE SERRATIA MARCESCENS NO ANAEROBES ISOLATED Performed at [facility], [address], [place], [place] [address]    [facility] SERRATIA MARCESCENS 04/04/2022     Lab Results  Component Value Date   ALBUMIN 2.6 (L) 09/20/2022   ALBUMIN 3.1 (L) 09/12/2022   ALBUMIN 3.6 (L) 09/11/2022    Lab Results  Component Value Date   MG  1.9 10/11/2022   MG 2.0 10/10/2022   MG 1.8 10/09/2022   Lab Results  Component Value Date   VD25OH 36.7 04/14/2020    No results found for: "PREALBUMIN"    Latest Ref Rng & Units 10/23/2022    2:10 PM 10/11/2022    7:00 AM 10/10/2022    4:46 AM  CBC EXTENDED  WBC 4.0 - 10.5 K/uL 9.2  8.3  7.2   RBC 4.22 - 5.81 MIL/uL 3.86  3.31  3.42   Hemoglobin 13.0 - 17.0 g/dL 62.1  9.9  30.8   HCT 65.7 - 52.0 % 34.7  30.2  30.8   Platelets 150 - 400 K/uL 254  284  338      There is no height or weight on file to calculate BMI.  Orders:  No orders of the defined types were placed in this encounter.  No orders of the defined types were placed in this encounter.    Procedures: No procedures performed  Clinical Data: No additional findings.  ROS:  All other systems negative, except as noted in the HPI. Review of Systems  Objective: Vital Signs: There were no vitals taken for this visit.  Specialty Comments:  No specialty comments available.  PMFS History: Patient Active Problem List   Diagnosis Date Noted   S/P  mitral valve clip implantation 10/11/2022   Acute on chronic systolic (congestive) heart failure 09/17/2022   Acute on chronic diastolic heart failure 09/12/2022   Pulmonary hypertension 07/13/2022   OSA (obstructive sleep apnea) 07/13/2022   Lymphedema 07/13/2022   Severe mitral insufficiency    Aortic valve stenosis, nonrheumatic    Hypotension    Transient hypotension 04/06/2022   Bradycardia 04/06/2022   (HFpEF) heart failure with preserved ejection fraction 04/06/2022   Abscess of leg, left 04/04/2022   Cellulitis of left lower extremity 03/02/2022   Chronic diastolic CHF (congestive heart failure) 03/02/2022   Cellulitis of left leg 03/02/2022   Foot callus 12/15/2021   Other secondary pulmonary hypertension 08/30/2021   COPD (chronic obstructive pulmonary disease) 08/30/2021   Coronary artery disease due to type 2 diabetes mellitus 07/21/2020   CHF  (congestive heart failure) 07/21/2020   History of ST elevation myocardial infarction (STEMI) 04/30/2020   Atrial fibrillation 02/29/2020   AKI (acute kidney injury) 02/28/2020   Hypokalemia 02/28/2020   Hyponatremia 02/28/2020   Lumbar radiculopathy 03/24/2019   Morbid obesity 09/04/2018   Lung nodules 01/17/2017   Ascending aortic aneurysm 01/17/2017   Type 2 diabetes mellitus 03/20/2016   Tear of lateral meniscus of right knee 11/19/2014   Hypertension    Hyperlipidemia    Metabolic syndrome    Osteoarthritis of both knees 07/06/2013   Acquired spondylolisthesis 03/06/2013   Degeneration of lumbar intervertebral disc 03/06/2013   Lumbar spondylosis 03/06/2013   Spinal stenosis of lumbar region 03/06/2013   Past Medical History:  Diagnosis Date   A-fib    Acute on chronic diastolic CHF (congestive heart failure) 07/21/2020   Coronary artery disease    Diabetes mellitus without complication    Dyspnea    Dysrhythmia    A-fib   Hyperlipidemia    Hypertension    Metabolic syndrome    Prediabetes    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 Excell Seltzer and Dr. Lynnette Caffey   Sleep apnea    STEMI (ST elevation myocardial infarction)     Family History  Problem Relation Age of Onset   Cancer Mother        lungs   Coronary artery disease Mother    Diabetes Brother    Heart attack Brother    Diabetes Maternal Grandmother    Arthritis Father     Past Surgical History:  Procedure Laterality Date   APPENDECTOMY     BUBBLE STUDY  10/02/2022   Procedure: BUBBLE STUDY;  Surgeon: Laurey Morale, MD;  Location: [facility] ENDOSCOPY;  Service: Cardiovascular;;   CHOLECYSTECTOMY N/A 03/02/2020   Procedure: LAPAROSCOPIC CHOLECYSTECTOMY;  Surgeon: Franky Macho, MD;  Location: [facility] ORS;  Service: General;  Laterality: N/A;   CORONARY STENT INTERVENTION N/A 04/30/2020   Procedure: CORONARY STENT INTERVENTION;  Surgeon: Swaziland,  Peter M, MD;  Location: [facility] INVASIVE CV LAB;  Service: Cardiovascular;  Laterality: N/A;   CORONARY/GRAFT ACUTE MI REVASCULARIZATION N/A 04/30/2020   Procedure: Coronary/Graft Acute MI Revascularization;  Surgeon: Swaziland, Peter M, MD;  Location: [facility] INVASIVE CV LAB;  Service: Cardiovascular;  Laterality: N/A;   Eyelid Surgery Bilateral    HERNIA REPAIR     I & D EXTREMITY Left 04/06/2022   Procedure: LEFT LEG DEBRIDEMENT;  Surgeon: Nadara Mustard, MD;  Location: [facility] OR;  Service: Orthopedics;  Laterality: Left;   I & D EXTREMITY Left 04/04/2022   Procedure: LEFT LEG  DEBRIDEMENT;  Surgeon: Nadara Mustard, MD;  Location: [facility] OR;  Service: Orthopedics;  Laterality: Left;   knee tendons repair     LEFT HEART CATH AND CORONARY ANGIOGRAPHY N/A 04/30/2020   Procedure: LEFT HEART CATH AND CORONARY ANGIOGRAPHY;  Surgeon: Swaziland, Peter M, MD;  Location: [facility] INVASIVE CV LAB;  Service: Cardiovascular;  Laterality: N/A;   MITRAL VALVE REPAIR N/A 10/10/2022   Procedure: MITRAL VALVE REPAIR;  Surgeon: Tonny Bollman, MD;  Location: [facility] INVASIVE CV LAB;  Service: Cardiovascular;  Laterality: N/A;   RIGHT HEART CATH N/A 10/03/2022   Procedure: RIGHT HEART CATH;  Surgeon: Laurey Morale, MD;  Location: [facility] INVASIVE CV LAB;  Service: Cardiovascular;  Laterality: N/A;   ROTATOR CUFF REPAIR Bilateral    TEE WITHOUT CARDIOVERSION N/A 10/02/2022   Procedure: TRANSESOPHAGEAL ECHOCARDIOGRAM (TEE);  Surgeon: Laurey Morale, MD;  Location: [facility] ENDOSCOPY;  Service: Cardiovascular;  Laterality: N/A;   TEE WITHOUT CARDIOVERSION N/A 10/10/2022   Procedure: TRANSESOPHAGEAL ECHOCARDIOGRAM;  Surgeon: Tonny Bollman, MD;  Location: [facility] INVASIVE CV LAB;  Service: Cardiovascular;  Laterality: N/A;   TONSILLECTOMY     Social History   Occupational History   Occupation: Education officer, environmental    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

## 2022-11-13 DIAGNOSIS — I5033 Acute on chronic diastolic (congestive) heart failure: Secondary | ICD-10-CM | POA: Diagnosis not present

## 2022-11-13 DIAGNOSIS — E1151 Type 2 diabetes mellitus with diabetic peripheral angiopathy without gangrene: Secondary | ICD-10-CM | POA: Diagnosis not present

## 2022-11-13 DIAGNOSIS — E1122 Type 2 diabetes mellitus with diabetic chronic kidney disease: Secondary | ICD-10-CM | POA: Diagnosis not present

## 2022-11-13 DIAGNOSIS — I872 Venous insufficiency (chronic) (peripheral): Secondary | ICD-10-CM | POA: Diagnosis not present

## 2022-11-13 DIAGNOSIS — I130 Hypertensive heart and chronic kidney disease with heart failure and stage 1 through stage 4 chronic kidney disease, or unspecified chronic kidney disease: Secondary | ICD-10-CM | POA: Diagnosis not present

## 2022-11-13 DIAGNOSIS — N1831 Chronic kidney disease, stage 3a: Secondary | ICD-10-CM | POA: Diagnosis not present

## 2022-11-14 DIAGNOSIS — E1122 Type 2 diabetes mellitus with diabetic chronic kidney disease: Secondary | ICD-10-CM | POA: Diagnosis not present

## 2022-11-14 DIAGNOSIS — I5033 Acute on chronic diastolic (congestive) heart failure: Secondary | ICD-10-CM | POA: Diagnosis not present

## 2022-11-14 DIAGNOSIS — E1151 Type 2 diabetes mellitus with diabetic peripheral angiopathy without gangrene: Secondary | ICD-10-CM | POA: Diagnosis not present

## 2022-11-14 DIAGNOSIS — I872 Venous insufficiency (chronic) (peripheral): Secondary | ICD-10-CM | POA: Diagnosis not present

## 2022-11-14 DIAGNOSIS — N1831 Chronic kidney disease, stage 3a: Secondary | ICD-10-CM | POA: Diagnosis not present

## 2022-11-14 DIAGNOSIS — I130 Hypertensive heart and chronic kidney disease with heart failure and stage 1 through stage 4 chronic kidney disease, or unspecified chronic kidney disease: Secondary | ICD-10-CM | POA: Diagnosis not present

## 2022-11-16 ENCOUNTER — Ambulatory Visit (INDEPENDENT_AMBULATORY_CARE_PROVIDER_SITE_OTHER): Payer: Medicare Other | Admitting: Cardiology

## 2022-11-16 ENCOUNTER — Ambulatory Visit (HOSPITAL_COMMUNITY): Payer: Medicare Other | Attending: Cardiology

## 2022-11-16 VITALS — BP 116/78 | HR 75 | Ht 68.0 in | Wt 237.6 lb

## 2022-11-16 DIAGNOSIS — I2510 Atherosclerotic heart disease of native coronary artery without angina pectoris: Secondary | ICD-10-CM | POA: Diagnosis not present

## 2022-11-16 DIAGNOSIS — N1831 Chronic kidney disease, stage 3a: Secondary | ICD-10-CM | POA: Diagnosis not present

## 2022-11-16 DIAGNOSIS — Z9889 Other specified postprocedural states: Secondary | ICD-10-CM | POA: Diagnosis not present

## 2022-11-16 DIAGNOSIS — Z95818 Presence of other cardiac implants and grafts: Secondary | ICD-10-CM | POA: Diagnosis not present

## 2022-11-16 DIAGNOSIS — E1159 Type 2 diabetes mellitus with other circulatory complications: Secondary | ICD-10-CM

## 2022-11-16 DIAGNOSIS — Z952 Presence of prosthetic heart valve: Secondary | ICD-10-CM | POA: Diagnosis not present

## 2022-11-16 DIAGNOSIS — I340 Nonrheumatic mitral (valve) insufficiency: Secondary | ICD-10-CM | POA: Diagnosis not present

## 2022-11-16 DIAGNOSIS — I5032 Chronic diastolic (congestive) heart failure: Secondary | ICD-10-CM | POA: Insufficient documentation

## 2022-11-16 LAB — ECHOCARDIOGRAM COMPLETE
AR max vel: 0.84 cm2
AV Area VTI: 0.8 cm2
AV Area mean vel: 0.77 cm2
AV Mean grad: 20.7 mmHg
AV Peak grad: 33.6 mmHg
Ao pk vel: 2.9 m/s
Area-P 1/2: 3.67 cm2
MV M vel: 4.42 m/s
MV Peak grad: 78.1 mmHg
MV VTI: 1.04 cm2
S' Lateral: 4.2 cm

## 2022-11-16 NOTE — Progress Notes (Signed)
[facility]   MULTIDISCIPLINARY HEART VALVE CLINIC                                     Cardiology Office Note:    Date:  11/16/2022   ID:  Ryan Garner, DOB Apr 27, 1946, MRN [id number]  PCP:  [name], [name], MD  [facility] Cardiologist:  [name], MD / Dr. Buddy Duty, MD and Dr. Lynnette Caffey, MD (TEER)  [facility] Electrophysiologist:  None   Referring MD: [name], [name], MD   Chief Complaint  Patient presents with   Follow-up    1 month s/p TEER   History of Present Illness:    Ryan Garner is a 77 y.o. male with a hx of CAD s/p STEMI with DES to LCx in 2021, permanent atrial fibrillation, HTN, HLD, sleep apnea, chronic lymphedema, HFpEF, and severe MR who is being seen for one month follow up s/p TEER.    Ryan Garner is followed by Dr. Tenny Craw for his cardiology care. He was admitted to [facility] 06/2022 with acute on chronic HF. He was diuresed with IV lasix and transitioned to PO by discharged. In follow up with Dr. Tenny Craw he was noted to be markedly fluid overloaded therefore he was admitted once again with a plan for cardiac catheterization and echocardiogram. On arrival CXR showed pleural effusions with relatively stable labs with BNP at 577, Cr 1.3, and K+ 5.4. AHF team was consulted due to hypotension and poor response to high dose IV lasix. He was started on dobutamine + IV lasix at that time. Echocardiogram showed low normal LVEF at 50-55% with D-shaped septum, moderate-severe RV enlargement with moderate RV dysfunction, PASP 69, severe biatrial enlargement, severe functional MR with restricted posterior leaflet, EROA 0.51cm2, RVol 64ml with moderate TR and moderate AS with AVA at 1.20, and mean gradient at . Plan was for [facility] once better diuresed. He was noted to have profound abdominal pain and found to have a rectus sheath hematoma on CT. Heparin was stopped and Hb has since stabilized. TEE confirmed severe MR with normal left ventricular size  with EF 55%, mildly D-shaped interventricular septum suggestive of RV pressure/volume overload, moderate RV dilation with moderately decreased RV systolic function. Mild TR, severe left atrial enlargement, no LA appendage thrombus, no PFO/ASD by bubble study, tricuspid aortic valve with severe calcification, moderate AS by planimetry/VTI and severe mitral regurgitation with inadequate coaptation and restriction of posterior leaflet, suspect atrial functional MR. ERO 0.54 cm^2 by PISA, vena contracta area 0.52 cm^2. There was slight flow reversal in all 4 pulmonary veins. Mean gradient 3 mmHg.  He is now s/p successful transcatheter edge-to-edge mitral valve repair under fluoroscopic and echo guidance on 10/10/22, reducing baseline 4+ mitral regurgitation to 1+ with 2 PASCAL ACE clips positioned on medial aspect A2/P2 and on A3/P3; residual transmitral gradient of 6 mmHg.   He was discharged on 10/11/22 and was seen in follow up and was doing very well. He is here today and states that he feels better than he has in many years. 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 He denies chest pain, palpitations, LE edema, SOB, bleeding, dizziness, or syncope.   Past Medical History:  Diagnosis Date   A-fib 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 Excell Seltzer and Dr. Lynnette Caffey   Sleep apnea    STEMI (ST elevation myocardial infarction) [facility])     Past Surgical History:  Procedure Laterality Date   APPENDECTOMY     BUBBLE STUDY  10/02/2022   Procedure: BUBBLE STUDY;  Surgeon: Laurey Morale, MD;  Location: [facility] ENDOSCOPY;   Service: Cardiovascular;;   CHOLECYSTECTOMY N/A 03/02/2020   Procedure: LAPAROSCOPIC CHOLECYSTECTOMY;  Surgeon: Franky Macho, MD;  Location: [facility] ORS;  Service: General;  Laterality: N/A;   CORONARY STENT INTERVENTION N/A 04/30/2020   Procedure: CORONARY STENT INTERVENTION;  Surgeon: Swaziland, Peter M, MD;  Location: [facility] INVASIVE CV LAB;  Service: Cardiovascular;  Laterality: N/A;   CORONARY/GRAFT ACUTE MI REVASCULARIZATION N/A 04/30/2020   Procedure: Coronary/Graft Acute MI Revascularization;  Surgeon: Swaziland, Peter M, MD;  Location: [facility] INVASIVE CV LAB;  Service: Cardiovascular;  Laterality: N/A;   Eyelid Surgery Bilateral    HERNIA REPAIR     I & D EXTREMITY Left 04/06/2022   Procedure: LEFT LEG DEBRIDEMENT;  Surgeon: Nadara Mustard, MD;  Location: [facility] OR;  Service: Orthopedics;  Laterality: Left;   I & D EXTREMITY Left 04/04/2022   Procedure: LEFT LEG DEBRIDEMENT;  Surgeon: Nadara Mustard, MD;  Location: [facility] OR;  Service: Orthopedics;  Laterality: Left;   knee tendons repair     LEFT HEART CATH AND CORONARY ANGIOGRAPHY N/A 04/30/2020   Procedure: LEFT HEART CATH AND CORONARY ANGIOGRAPHY;  Surgeon: Swaziland, Peter M, MD;  Location: [facility] INVASIVE CV LAB;  Service: Cardiovascular;  Laterality: N/A;   MITRAL VALVE REPAIR N/A 10/10/2022   Procedure: MITRAL VALVE REPAIR;  Surgeon: Tonny Bollman, MD;  Location: [facility] INVASIVE CV LAB;  Service: Cardiovascular;  Laterality: N/A;   RIGHT HEART CATH N/A 10/03/2022   Procedure: RIGHT HEART CATH;  Surgeon: Laurey Morale, MD;  Location: [facility] INVASIVE CV LAB;  Service: Cardiovascular;  Laterality: N/A;   ROTATOR CUFF REPAIR Bilateral    TEE WITHOUT CARDIOVERSION N/A 10/02/2022   Procedure: TRANSESOPHAGEAL ECHOCARDIOGRAM (TEE);  Surgeon: Laurey Morale, MD;  Location: [facility] ENDOSCOPY;  Service: Cardiovascular;  Laterality: N/A;   TEE WITHOUT CARDIOVERSION N/A 10/10/2022   Procedure: TRANSESOPHAGEAL ECHOCARDIOGRAM;  Surgeon: Tonny Bollman, MD;  Location: [facility] INVASIVE CV LAB;   Service: Cardiovascular;  Laterality: N/A;   TONSILLECTOMY      Current Medications: Current Meds  Medication Sig   albuterol (VENTOLIN HFA) 108 (90 Base) MCG/ACT inhaler Inhale 2 puffs into the lungs every 6 (six) hours as needed for wheezing or shortness of breath.   apixaban (ELIQUIS) 5 MG TABS tablet Take 1 tablet (5 mg total) by mouth 2 (two) times daily.   ascorbic acid (VITAMIN C) 1000 MG tablet Take 1 tablet (1,000 mg total) by mouth daily.   atorvastatin (LIPITOR) 80 MG tablet Take 1 tablet by mouth once daily   metolazone (ZAROXOLYN) 5 MG tablet Take 5 mg by mouth as needed.   metoprolol tartrate (LOPRESSOR) 50 MG tablet Take 50 mg by mouth 2 (two) times daily.   midodrine (PROAMATINE) 2.5 MG tablet Take 1 tablet (2.5 mg total) by mouth 3 (three) times daily with meals.   Multiple  Vitamin (MULTIVITAMIN WITH MINERALS) TABS tablet Take 1 tablet by mouth daily.   nitroGLYCERIN (NITROSTAT) 0.4 MG SL tablet Place 1 tablet (0.4 mg total) under the tongue every 5 (five) minutes x 3 doses as needed for chest pain.   nutrition supplement, JUVEN, (JUVEN) PACK Take 1 packet by mouth 2 (two) times daily between meals.   nystatin (MYCOSTATIN/NYSTOP) powder Apply 1 Application topically 3 (three) times daily.   potassium chloride SA (KLOR-CON M) 20 MEQ tablet Take 2 tablets (40 mEq total) by mouth daily.   spironolactone (ALDACTONE) 25 MG tablet Take 1/2 tablet (12.5 mg total) by mouth daily.   Tiotropium Bromide Monohydrate (SPIRIVA RESPIMAT) 1.25 MCG/ACT AERS Inhale 2 puffs into the lungs daily.   torsemide (DEMADEX) 20 MG tablet Take 4 tablets (80 mg total) by mouth 2 (two) times daily.   zinc sulfate 220 (50 Zn) MG capsule Take 1 capsule (220 mg total) by mouth daily.     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, environmental  Tobacco Use   Smoking status: Former    Types: Cigarettes    Quit date: 04/02/1959    Years since quitting: 63.6   Smokeless tobacco: Never  Vaping Use   Vaping Use: Never used  Substance and Sexual Activity   Alcohol use: No   Drug use: No   Sexual activity: Yes  Other Topics Concern   Not on file  Social History Narrative   Not on file   Social Determinants of Health   Financial Resource Strain: Low Risk  (10/19/2022)   Overall Financial Resource Strain (CARDIA)    Difficulty of Paying Living Expenses: Not hard at all  Food Insecurity: No Food Insecurity (10/19/2022)   Hunger Vital Sign    Worried About Running Out of Food in the Last Year: Never true    Ran Out of Food in the Last Year: Never true  Transportation Needs: No Transportation Needs (10/19/2022)   PRAPARE - Administrator, Civil Service (Medical): No    Lack of Transportation (Non-Medical): No  Physical Activity: Inactive (10/14/2022)   Exercise Vital Sign    Days of Exercise per Week: 0 days    Minutes of Exercise per Session: 30 min  Stress: No Stress Concern Present (10/19/2022)   [name]    Feeling of Stress : Not at all  Social Connections: Moderately Integrated (10/14/2022)   Social Connection and Isolation Panel [NHANES]    Frequency of Communication with Friends and Family: More than three times a week    Frequency of Social Gatherings with Friends and Family: Once a week    Attends Religious Services: More than 4 times per year    Active Member of [employer] or Organizations: Yes    Attends Banker Meetings: More than 4 times per year    Marital Status: Widowed     Family History: The patient's family history includes  Arthritis in his father; Cancer in his mother; Coronary artery disease in his mother; Diabetes in his brother and maternal grandmother; Heart  attack in his brother.  ROS:   Please see the history of present illness.    All other systems reviewed and are negative.  EKGs/Labs/Other Studies Reviewed:    The following studies were reviewed today:  TEER 10/10/22:  Successful TEER using 2 Pascal ACE devices, first device positioned A2/P2 and second device positioned A3/P3. MR reduced from 4+ at baseline to 1+ post-procedure.    Echocardiogram 10/11/22:   1. Left ventricular ejection fraction, by estimation, is 40 to 45%. The  left ventricle has mildly decreased function. The left ventricle  demonstrates global hypokinesis. Left ventricular diastolic function could  not be evaluated. There is the  interventricular septum is flattened in diastole ('D' shaped left  ventricle), consistent with right ventricular volume overload.   2. Right ventricular systolic function is moderately reduced. The right  ventricular size is severely enlarged. There is normal pulmonary artery  systolic pressure. The estimated right ventricular systolic pressure is  35.8 mmHg.   3. Left atrial size was severely dilated.   4. Right atrial size was severely dilated.   5. There are two stable, well seated mitral clips in the A2-P2 and A3-P3  positions. Two independent jets of residual MR are seen. The smaller jet  is located between the two clips, the larger is lateral to the clips. The  patient is in atrial  fibrillation, which makes quantitation very challenging. PISA based  calculations are not validated in this post-clip situation, but the  cumulative ERO area is 0.25 cm sq, the cumulative regurgitant volume is 37  ml, the regurgitant fraction is 38%. The  mitral valve has been repaired/replaced. Mild to moderate mitral valve  regurgitation. The mean mitral valve gradient is 4.2 mmHg with average  heart rate  of 77 bpm. There is a Pascal ACE x2 present in the mitral  position. Procedure Date: 10/10/2022.   6. Tricuspid valve regurgitation is mild to moderate.   7. The aortic valve is tricuspid. There is moderate calcification of the  aortic valve. There is moderate thickening of the aortic valve. Aortic  valve regurgitation is not visualized. Aortic valve mean gradient measures  22.0 mmHg. Aortic valve Vmax  measures 3.20 m/s.   8. There is mild dilatation of the ascending aorta, measuring 41 mm.   9. The inferior vena cava is dilated in size with <50% respiratory  variability, suggesting right atrial pressure of 15 mmHg.  10. Evidence of atrial level shunting detected by color flow Doppler.  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   Echocardiogram 11/16/22:   1. S/P mitraclip x 2 (A2-P2, A3-P3) with mild to moderate MR and mean  gradient 6 mmHg (HR 84). Calcified aortic valve with severe AS (mean  gradient 21 mmHg; AVA 0.8 cm2; DI 0.25).   2. Left ventricular ejection fraction, by estimation, is 50 to 55%. The  left ventricle has low normal function. The left ventricle has no regional  wall motion abnormalities. The left ventricular internal cavity size was  mildly dilated. There is mild  left ventricular hypertrophy. Left ventricular diastolic function could  not be evaluated. There is the interventricular septum is flattened in  systole and diastole, consistent with right ventricular pressure and  volume overload.   3. Right ventricular systolic function is moderately reduced. The right  ventricular size is moderately enlarged.   4. Left atrial size was severely dilated.   5. Right atrial size was  severely dilated.   6. The mitral valve is normal in structure. Mild to moderate mitral valve  regurgitation. No evidence of mitral stenosis. Procedure Date: 10/10/22.   7. The aortic valve is calcified. Aortic valve  regurgitation is not  visualized. Severe aortic valve stenosis.   8. The inferior vena cava is dilated in size with >50% respiratory  variability, suggesting right atrial pressure of 8 mmHg.   9. 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   Comparison(s): No significant change from prior study.    EKG:  EKG is not ordered today.   Recent Labs: 09/11/2022: NT-Pro BNP 8,444 09/12/2022: B Natriuretic Peptide 577.3 09/20/2022: ALT 13 10/11/2022: Magnesium 1.9 10/23/2022: BUN 60; Creatinine, Ser 1.55; Hemoglobin 11.5; Platelets 254; Potassium 4.3; Sodium 131   Recent Lipid Panel    Component Value Date/Time   CHOL 92 (L) 12/15/2021 1259   TRIG 60 12/15/2021 1259   TRIG 74 08/27/2014 0826   HDL 35 (L) 12/15/2021 1259   HDL 56 08/27/2014 0826   CHOLHDL 2.6 12/15/2021 1259   CHOLHDL 4.1 05/01/2020 1759   VLDL 13 05/01/2020 1759   LDLCALC 43 12/15/2021 1259   LDLCALC 78 11/13/2013 0911    Physical Exam:    VS:  BP 116/78 (BP Location: Left Arm, Patient Position: Sitting, Cuff Size: Normal)   Pulse 75   Ht 5\' 8"  (1.727 m)   Wt 237 lb 9.6 oz (107.8 kg)   SpO2 96%   BMI 36.13 kg/m     Wt Readings from Last 3 Encounters:  11/16/22 237 lb 9.6 oz (107.8 kg)  10/23/22 221 lb (100.2 kg)  10/15/22 216 lb (98 kg)    General: Well developed, well nourished, NAD Lungs:Clear to ausculation bilaterally. No wheezes, rales, or rhonchi. Breathing is unlabored. Cardiovascular: RRR with S1 S2. + murmur Extremities: No edema. Neuro: Alert and oriented. No focal deficits. No facial asymmetry. MAE spontaneously. Psych: Responds to questions appropriately with normal affect.    ASSESSMENT/PLAN:    Severe MR s/p TEER: Underwent successful transcatheter edge-to-edge mitral valve repair under fluoroscopic and echo guidance on 10/10/22, reducing baseline 4+ mitral regurgitation to 1+ with 2 PASCAL ACE clips positioned on medial  aspect A2/P2 and on A3/P3; residual transmitral gradient of 6 mmHg. Patient doing well today with NYHA class I symptoms. Echo today with stable gradient at with mild to moderate residual MR with essentially no change since last echo. Continue Eliquis monotherapy. Will require lifelong dental SBE with Amoxicillin. Plan follow up with AHF and our team in one year with repeat echo.   Chronic HFpEF: Followed closely by AHF. Apears euvolemic on exam today with no changes needed at this time.   CKD stage IIIa: Cr noted to be 1.55 on last labwork. This appears to be at his baseline.   Permanent AF: Rate controlled with no c/o palpitations. Continue Eliquis.   CAD: Hx of PCI to [facility] with no anginal symptoms. No ASA given DOAC.   Left rectus sheath hematoma: Resolved. CT with 15.0 x 7.4 x 14.7cm hematoma.  No further abdominal pain     Medication Adjustments/Labs and Tests Ordered: Current medicines are reviewed at length with the patient today.  Concerns regarding medicines are outlined above.  Orders Placed This Encounter  Procedures   ECHOCARDIOGRAM COMPLETE   No orders of the defined types were placed in this encounter.   Patient Instructions  Medication Instructions:  Your physician recommends that you continue on your current medications as directed. Please refer to the Current Medication list given to you today.   *If you need a refill on your cardiac medications before your next appointment, please call your pharmacy*   Lab Work: None ordered   If you have labs (blood work) drawn today and your tests are completely normal, you will receive your results only by: MyChart Message (if you have MyChart) OR A paper copy in the mail If you have any lab test that is abnormal or we need to change your treatment, we will call you to review the results.   Testing/Procedures: None ordered    Follow-Up: Follow up as schedule  Other Instructions     Signed, Georgie Chard, NP   11/16/2022 2:40 PM    [facility]

## 2022-11-16 NOTE — Patient Instructions (Signed)
Medication Instructions:  Your physician recommends that you continue on your current medications as directed. Please refer to the Current Medication list given to you today.   *If you need a refill on your cardiac medications before your next appointment, please call your pharmacy*   Lab Work: None ordered   If you have labs (blood work) drawn today and your tests are completely normal, you will receive your results only by: MyChart Message (if you have MyChart) OR A paper copy in the mail If you have any lab test that is abnormal or we need to change your treatment, we will call you to review the results.   Testing/Procedures: None ordered    Follow-Up: Follow up as schedule  Other Instructions

## 2022-11-17 ENCOUNTER — Other Ambulatory Visit: Payer: Self-pay | Admitting: Internal Medicine

## 2022-11-20 DIAGNOSIS — I5033 Acute on chronic diastolic (congestive) heart failure: Secondary | ICD-10-CM | POA: Diagnosis not present

## 2022-11-20 DIAGNOSIS — I130 Hypertensive heart and chronic kidney disease with heart failure and stage 1 through stage 4 chronic kidney disease, or unspecified chronic kidney disease: Secondary | ICD-10-CM | POA: Diagnosis not present

## 2022-11-20 DIAGNOSIS — E1122 Type 2 diabetes mellitus with diabetic chronic kidney disease: Secondary | ICD-10-CM | POA: Diagnosis not present

## 2022-11-20 DIAGNOSIS — E1151 Type 2 diabetes mellitus with diabetic peripheral angiopathy without gangrene: Secondary | ICD-10-CM | POA: Diagnosis not present

## 2022-11-20 DIAGNOSIS — I872 Venous insufficiency (chronic) (peripheral): Secondary | ICD-10-CM | POA: Diagnosis not present

## 2022-11-20 DIAGNOSIS — N1831 Chronic kidney disease, stage 3a: Secondary | ICD-10-CM | POA: Diagnosis not present

## 2022-11-29 ENCOUNTER — Encounter: Payer: Self-pay | Admitting: Orthopedic Surgery

## 2022-11-29 ENCOUNTER — Ambulatory Visit (INDEPENDENT_AMBULATORY_CARE_PROVIDER_SITE_OTHER): Payer: Medicare Other | Admitting: Orthopedic Surgery

## 2022-11-29 DIAGNOSIS — I890 Lymphedema, not elsewhere classified: Secondary | ICD-10-CM | POA: Diagnosis not present

## 2022-11-29 DIAGNOSIS — I5033 Acute on chronic diastolic (congestive) heart failure: Secondary | ICD-10-CM | POA: Diagnosis not present

## 2022-11-29 DIAGNOSIS — N1831 Chronic kidney disease, stage 3a: Secondary | ICD-10-CM | POA: Diagnosis not present

## 2022-11-29 DIAGNOSIS — I872 Venous insufficiency (chronic) (peripheral): Secondary | ICD-10-CM | POA: Diagnosis not present

## 2022-11-29 DIAGNOSIS — E1151 Type 2 diabetes mellitus with diabetic peripheral angiopathy without gangrene: Secondary | ICD-10-CM | POA: Diagnosis not present

## 2022-11-29 DIAGNOSIS — I130 Hypertensive heart and chronic kidney disease with heart failure and stage 1 through stage 4 chronic kidney disease, or unspecified chronic kidney disease: Secondary | ICD-10-CM | POA: Diagnosis not present

## 2022-11-29 DIAGNOSIS — L97921 Non-pressure chronic ulcer of unspecified part of left lower leg limited to breakdown of skin: Secondary | ICD-10-CM | POA: Diagnosis not present

## 2022-11-29 DIAGNOSIS — E1122 Type 2 diabetes mellitus with diabetic chronic kidney disease: Secondary | ICD-10-CM | POA: Diagnosis not present

## 2022-11-29 NOTE — Progress Notes (Signed)
Office Visit Note   Patient: Ryan Garner           Date of Birth: 1945/12/22           MRN: [id number] Visit Date: 11/29/2022              Requested by: [name], [name], MD [address] [place],  [place] [address] PCP: [name], [name], MD  Chief Complaint  Patient presents with   Right Leg - Follow-up   Left Leg - Follow-up      HPI: Patient is a 77 year old gentleman who is status post debridement and tissue graft for a large necrotic wound proximal medial aspect of the left leg.  Patient states that he is using his lymphedema pumps an hour a day.  He is using an Ace wrap for compression.  Assessment & Plan: Visit Diagnoses:  1. Nonhealing ulcer of left lower leg limited to breakdown of skin (HCC)   2. Lymphedema of both lower extremities   3. Venous stasis dermatitis of both lower extremities     Plan: The ulcer has completely healed he will proceed with his current treatment.  Follow-Up Instructions: No follow-ups on file.   Ortho Exam  Patient is alert, oriented, no adenopathy, well-dressed, normal affect, normal respiratory effort. Examination there is complete epithelialization of the wound on the medial proximal aspect of the left leg.  The skin has good elasticity without scar tissue.  Imaging: No results found.    Labs: Lab Results  Component Value Date   HGBA1C 6.4 (H) 10/10/2022   HGBA1C 7.0 (H) 12/15/2021   HGBA1C 6.4 (H) 04/28/2021   REPTSTATUS 04/11/2022 FINAL 04/04/2022   GRAMSTAIN NO WBC SEEN NO ORGANISMS SEEN  04/04/2022   CULT  04/04/2022    RARE SERRATIA MARCESCENS NO ANAEROBES ISOLATED Performed at [facility], [address], [place], [place] [address]    [facility] SERRATIA MARCESCENS 04/04/2022     Lab Results  Component Value Date   ALBUMIN 2.6 (L) 09/20/2022   ALBUMIN 3.1 (L) 09/12/2022   ALBUMIN 3.6 (L) 09/11/2022    Lab Results  Component Value Date   MG 1.9 10/11/2022   MG 2.0 10/10/2022   MG 1.8  10/09/2022   Lab Results  Component Value Date   VD25OH 36.7 04/14/2020    No results found for: "PREALBUMIN"    Latest Ref Rng & Units 10/23/2022    2:10 PM 10/11/2022    7:00 AM 10/10/2022    4:46 AM  CBC EXTENDED  WBC 4.0 - 10.5 K/uL 9.2  8.3  7.2   RBC 4.22 - 5.81 MIL/uL 3.86  3.31  3.42   Hemoglobin 13.0 - 17.0 g/dL 41.3  9.9  24.4   HCT 01.0 - 52.0 % 34.7  30.2  30.8   Platelets 150 - 400 K/uL 254  284  338      There is no height or weight on file to calculate BMI.  Orders:  No orders of the defined types were placed in this encounter.  No orders of the defined types were placed in this encounter.    Procedures: No procedures performed  Clinical Data: No additional findings.  ROS:  All other systems negative, except as noted in the HPI. Review of Systems  Objective: Vital Signs: There were no vitals taken for this visit.  Specialty Comments:  No specialty comments available.  PMFS History: Patient Active Problem List   Diagnosis Date Noted  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   Acute on chronic systolic (congestive) heart failure (HCC) 09/17/2022   Acute on chronic diastolic heart failure (HCC) 09/12/2022   Pulmonary hypertension (HCC) 07/13/2022   OSA (obstructive sleep apnea) 07/13/2022   Lymphedema 07/13/2022   Severe mitral insufficiency    Aortic valve stenosis, nonrheumatic    Hypotension    Transient hypotension 04/06/2022   Bradycardia 04/06/2022   (HFpEF) heart failure with preserved ejection fraction (HCC) 04/06/2022   Abscess of leg, left 04/04/2022   Cellulitis of left lower extremity 03/02/2022   Chronic diastolic CHF (congestive heart failure) (HCC) 03/02/2022   Cellulitis of left leg 03/02/2022   Foot callus 12/15/2021   Other secondary pulmonary hypertension (HCC) 08/30/2021   COPD (chronic obstructive pulmonary disease) (HCC) 08/30/2021   Coronary artery disease due to type 2 diabetes mellitus (HCC) 07/21/2020   CHF  (congestive heart failure) (HCC) 07/21/2020   History of ST elevation myocardial infarction (STEMI) 04/30/2020   Atrial fibrillation (HCC) 02/29/2020   AKI (acute kidney injury) (HCC) 02/28/2020   Hypokalemia 02/28/2020   Hyponatremia 02/28/2020   Lumbar radiculopathy 03/24/2019   Morbid obesity (HCC) 09/04/2018   Lung nodules 01/17/2017   Ascending aortic aneurysm 01/17/2017   Type 2 diabetes mellitus (HCC) 03/20/2016   Tear of lateral meniscus of right knee 11/19/2014   Hypertension    Hyperlipidemia    Metabolic syndrome    Osteoarthritis of both knees 07/06/2013   Acquired spondylolisthesis 03/06/2013   Degeneration of lumbar intervertebral disc 03/06/2013   Lumbar spondylosis 03/06/2013   Spinal stenosis of lumbar region 03/06/2013   Past Medical History:  Diagnosis Date   A-fib [facility])    Acute on chronic diastolic CHF (congestive heart failure) (HCC) 07/21/2020   Coronary artery disease    Diabetes mellitus without complication (HCC)    Dyspnea    Dysrhythmia    A-fib   Hyperlipidemia    Hypertension    Metabolic syndrome    Prediabetes    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 Excell Seltzer and Dr. Lynnette Caffey   Sleep apnea    STEMI (ST elevation myocardial infarction) [facility])     Family History  Problem Relation Age of Onset   Cancer Mother        lungs   Coronary artery disease Mother    Diabetes Brother    Heart attack Brother    Diabetes Maternal Grandmother    Arthritis Father     Past Surgical History:  Procedure Laterality Date   APPENDECTOMY     BUBBLE STUDY  10/02/2022   Procedure: BUBBLE STUDY;  Surgeon: Laurey Morale, MD;  Location: [facility] ENDOSCOPY;  Service: Cardiovascular;;   CHOLECYSTECTOMY N/A 03/02/2020   Procedure: LAPAROSCOPIC CHOLECYSTECTOMY;  Surgeon: Franky Macho, MD;  Location: [facility] ORS;  Service: General;  Laterality: N/A;   CORONARY STENT INTERVENTION N/A 04/30/2020    Procedure: CORONARY STENT INTERVENTION;  Surgeon: Swaziland, Peter M, MD;  Location: [facility] INVASIVE CV LAB;  Service: Cardiovascular;  Laterality: N/A;   CORONARY/GRAFT ACUTE MI REVASCULARIZATION N/A 04/30/2020   Procedure: Coronary/Graft Acute MI Revascularization;  Surgeon: Swaziland, Peter M, MD;  Location: [facility] INVASIVE CV LAB;  Service: Cardiovascular;  Laterality: N/A;   Eyelid Surgery Bilateral    HERNIA REPAIR     I & D EXTREMITY Left 04/06/2022   Procedure: LEFT LEG DEBRIDEMENT;  Surgeon: Nadara Mustard, MD;  Location: [facility] OR;  Service: Orthopedics;  Laterality: Left;   I & D EXTREMITY Left 04/04/2022   Procedure: LEFT LEG DEBRIDEMENT;  Surgeon: Nadara Mustard, MD;  Location: [facility] OR;  Service: Orthopedics;  Laterality: Left;   knee tendons repair     LEFT HEART CATH AND CORONARY ANGIOGRAPHY N/A 04/30/2020   Procedure: LEFT HEART CATH AND CORONARY ANGIOGRAPHY;  Surgeon: Swaziland, Peter M, MD;  Location: [facility] INVASIVE CV LAB;  Service: Cardiovascular;  Laterality: N/A;   MITRAL VALVE REPAIR N/A 10/10/2022   Procedure: MITRAL VALVE REPAIR;  Surgeon: Tonny Bollman, MD;  Location: [facility] INVASIVE CV LAB;  Service: Cardiovascular;  Laterality: N/A;   RIGHT HEART CATH N/A 10/03/2022   Procedure: RIGHT HEART CATH;  Surgeon: Laurey Morale, MD;  Location: [facility] INVASIVE CV LAB;  Service: Cardiovascular;  Laterality: N/A;   ROTATOR CUFF REPAIR Bilateral    TEE WITHOUT CARDIOVERSION N/A 10/02/2022   Procedure: TRANSESOPHAGEAL ECHOCARDIOGRAM (TEE);  Surgeon: Laurey Morale, MD;  Location: [facility] ENDOSCOPY;  Service: Cardiovascular;  Laterality: N/A;   TEE WITHOUT CARDIOVERSION N/A 10/10/2022   Procedure: TRANSESOPHAGEAL ECHOCARDIOGRAM;  Surgeon: Tonny Bollman, MD;  Location: [facility] INVASIVE CV LAB;  Service: Cardiovascular;  Laterality: N/A;   TONSILLECTOMY     Social History   Occupational History   Occupation: Education officer, environmental    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

## 2022-12-06 DIAGNOSIS — E1122 Type 2 diabetes mellitus with diabetic chronic kidney disease: Secondary | ICD-10-CM | POA: Diagnosis not present

## 2022-12-06 DIAGNOSIS — E1151 Type 2 diabetes mellitus with diabetic peripheral angiopathy without gangrene: Secondary | ICD-10-CM | POA: Diagnosis not present

## 2022-12-06 DIAGNOSIS — I130 Hypertensive heart and chronic kidney disease with heart failure and stage 1 through stage 4 chronic kidney disease, or unspecified chronic kidney disease: Secondary | ICD-10-CM | POA: Diagnosis not present

## 2022-12-06 DIAGNOSIS — N1831 Chronic kidney disease, stage 3a: Secondary | ICD-10-CM | POA: Diagnosis not present

## 2022-12-06 DIAGNOSIS — I5033 Acute on chronic diastolic (congestive) heart failure: Secondary | ICD-10-CM | POA: Diagnosis not present

## 2022-12-06 DIAGNOSIS — I872 Venous insufficiency (chronic) (peripheral): Secondary | ICD-10-CM | POA: Diagnosis not present

## 2022-12-14 ENCOUNTER — Encounter (HOSPITAL_COMMUNITY): Payer: Self-pay | Admitting: Cardiology

## 2022-12-14 ENCOUNTER — Other Ambulatory Visit (HOSPITAL_COMMUNITY): Payer: Self-pay

## 2022-12-14 ENCOUNTER — Ambulatory Visit (HOSPITAL_COMMUNITY)
Admission: RE | Admit: 2022-12-14 | Discharge: 2022-12-14 | Disposition: A | Discharge status: Home / Self Care | Payer: Medicare Other | Source: Ambulatory Visit | Attending: Cardiology | Admitting: Cardiology

## 2022-12-14 VITALS — BP 110/70 | HR 60

## 2022-12-14 DIAGNOSIS — I130 Hypertensive heart and chronic kidney disease with heart failure and stage 1 through stage 4 chronic kidney disease, or unspecified chronic kidney disease: Secondary | ICD-10-CM | POA: Diagnosis not present

## 2022-12-14 DIAGNOSIS — I5032 Chronic diastolic (congestive) heart failure: Secondary | ICD-10-CM

## 2022-12-14 DIAGNOSIS — E1122 Type 2 diabetes mellitus with diabetic chronic kidney disease: Secondary | ICD-10-CM | POA: Insufficient documentation

## 2022-12-14 DIAGNOSIS — I5033 Acute on chronic diastolic (congestive) heart failure: Secondary | ICD-10-CM | POA: Insufficient documentation

## 2022-12-14 DIAGNOSIS — N183 Chronic kidney disease, stage 3 unspecified: Secondary | ICD-10-CM | POA: Diagnosis not present

## 2022-12-14 DIAGNOSIS — I4821 Permanent atrial fibrillation: Secondary | ICD-10-CM | POA: Diagnosis not present

## 2022-12-14 DIAGNOSIS — I252 Old myocardial infarction: Secondary | ICD-10-CM | POA: Diagnosis not present

## 2022-12-14 DIAGNOSIS — I2510 Atherosclerotic heart disease of native coronary artery without angina pectoris: Secondary | ICD-10-CM | POA: Diagnosis not present

## 2022-12-14 DIAGNOSIS — Z7901 Long term (current) use of anticoagulants: Secondary | ICD-10-CM | POA: Insufficient documentation

## 2022-12-14 DIAGNOSIS — I080 Rheumatic disorders of both mitral and aortic valves: Secondary | ICD-10-CM | POA: Insufficient documentation

## 2022-12-14 DIAGNOSIS — Z79899 Other long term (current) drug therapy: Secondary | ICD-10-CM | POA: Insufficient documentation

## 2022-12-14 LAB — LIPID PANEL
Cholesterol: 100 mg/dL (ref 0–200)
HDL: 50 mg/dL (ref >40)
LDL Cholesterol: 42 mg/dL (ref 0–99)
Total CHOL/HDL Ratio: 2 RATIO
Triglycerides: 39 mg/dL (ref <150)
VLDL: 8 mg/dL (ref 0–40)

## 2022-12-14 LAB — CBC
HCT: 32.8 % — ABNORMAL LOW (ref 39.0–52.0)
Hemoglobin: 10.8 g/dL — ABNORMAL LOW (ref 13.0–17.0)
MCH: 29.7 pg (ref 26.0–34.0)
MCHC: 32.9 g/dL (ref 30.0–36.0)
MCV: 90.1 fL (ref 80.0–100.0)
Platelets: 241 10*3/uL (ref 150–400)
RBC: 3.64 MIL/uL — ABNORMAL LOW (ref 4.22–5.81)
RDW: 14.2 % (ref 11.5–15.5)
WBC: 8.1 10*3/uL (ref 4.0–10.5)
nRBC: 0 % (ref 0.0–0.2)

## 2022-12-14 LAB — BRAIN NATRIURETIC PEPTIDE: B Natriuretic Peptide: 363.3 pg/mL — ABNORMAL HIGH (ref 0.0–100.0)

## 2022-12-14 MED ORDER — POTASSIUM CHLORIDE CRYS ER 20 MEQ PO TBCR: EXTENDED_RELEASE_TABLET | ORAL | 5 refills | Status: DC

## 2022-12-14 MED ORDER — SPIRONOLACTONE 25 MG PO TABS: 25.0000 mg | ORAL_TABLET | Freq: Every day | ORAL | 3 refills | Status: DC

## 2022-12-14 MED ORDER — METOLAZONE 2.5 MG PO TABS: 2.5000 mg | ORAL_TABLET | ORAL | 3 refills | Status: DC

## 2022-12-14 NOTE — Patient Instructions (Addendum)
INCREASE Spironolactone to 25 mg daily.  START  Farxiga 10 mg daily.  TAKE Metolazone 2.5 mg every Saturday, with 2 extra Potassium tablets.  Labs done today, your results will be available in MyChart, we will contact you for abnormal readings.  Repeat blood work in 7 days at [facility].  You are scheduled for a Cardiac Catheterization on Wednesday, June 5 with Dr. Marca Ancona.  1. Please arrive at the [facility] (Main Entrance A) at [facility]: [address] [place], [place] [address] at 8:00 AM (This time is 2 hour(s) before your procedure to ensure your preparation). Free valet parking service is available. You will check in at ADMITTING. The support person will be asked to wait in the waiting room.  It is OK to have someone drop you off and come back when you are ready to be discharged.    Special note: Every effort is made to have your procedure done on time. Please understand that emergencies sometimes delay scheduled procedures.  2. Diet: Do not eat solid foods after midnight.  The patient may have clear liquids until 5am upon the day of the procedure.  3. Medication instructions in preparation for your procedure:   Contrast Allergy: No  HOLD YOUR TORSEMIDE, SPIRONOLACTONE AND FARXIGA THE DAY OF YOUR PROCEDURE    On the morning of your procedure, take your morning medicines NOT listed above.  You may use sips of water.  5. Plan to go home the same day, you will only stay overnight if medically necessary. 6. Bring a current list of your medications and current insurance cards. 7. You MUST have a responsible person to drive you home. 8. Someone MUST be with you the first 24 hours after you arrive home or your discharge will be delayed. 9. Please wear clothes that are easy to get on and off and wear slip-on shoes.  Your physician recommends that you schedule a follow-up appointment in: 4 WEEKS  If you have any questions or concerns before your next appointment  please send [place] a message through [place] or call our office at [phone].    TO LEAVE A MESSAGE FOR THE NURSE SELECT OPTION 2, PLEASE LEAVE A MESSAGE INCLUDING: YOUR NAME DATE OF BIRTH CALL BACK NUMBER REASON FOR CALL**this is important as we prioritize the call backs  YOU WILL RECEIVE A CALL BACK THE SAME DAY AS LONG AS YOU CALL BEFORE 4:00 PM  At the [facility], you and your health needs are our priority. As part of our continuing mission to provide you with exceptional heart care, we have created designated Provider Care Teams. These Care Teams include your primary Cardiologist (physician) and Advanced Practice Providers (APPs- Physician Assistants and Nurse Practitioners) who all work together to provide you with the care you need, when you need it.   You may see any of the following providers on your designated Care Team at your next follow up: Dr Arvilla Meres Dr Marca Ancona Dr. Marcos Eke, NP Robbie Lis, [place] [facility] [place], [place] Brynda Peon, NP Karle Plumber, PharmD   Please be sure to bring in all your medications bottles to every appointment.    Thank you for choosing [facility]

## 2022-12-14 NOTE — Progress Notes (Signed)
Medication Samples have been provided to the patient.  Drug name: Marcelline Deist       Strength: 10 mg        Qty: 4  LOT: [id number]  Exp.Date:05/22/2025  Dosing instructions: Take 1 tablet daily  The patient has been instructed regarding the correct time, dose, and frequency of taking this medication, including desired effects and most common side effects.   Smitty Cords Talitha Dicarlo 12:14 PM 12/14/2022

## 2022-12-15 NOTE — H&P (View-Only) (Signed)
[facility] Note   Referring Physician: PCP: Garner, Ryan A, MD PCP-Cardiologist: Ryan Ross, MD  HF Cardiology: Dr. Arlie Posch  HPI:  Ryan Garner is 77 year old with a history of CAD with STEMI 2021 and DES to LCx, permanent atrial fibrillation, severe Ryan, HTN, HLD, sleep apnea, and HFpEF.    Admitted 2/24 with A/C HFpEF and RV failure. Echo showed EF 50-55%, D-shaped septum, moderate-severe RV enlargement with moderate RV dysfunction, PASP 69, severe biatrial enlargement, severe mitral regurgitation with restricted posterior mitral leaflet, EROA 0.51 cm2 by PISA, moderate AS with mean gradient 21 mmHg/AVA 1.2 cm^2, IVC dilated. Diuresed with IV lasix with poor response, hypotension and worsening renal function. Dobutamine added for RV support. Diuresis improved. Overall diuresed > 90 pounds. Transitioned to torsemide 80 mg twice daily.  Hospitalization complicated by development of acute abdominal pain. Sent for stat CT abdomen/pelvis and this showed spontaneous rectus sheath hematoma. Anticoagulants held w/ gradual improvement. Anticoagulants ultimately restarted.  Once stabilized, he underwent TEE to further assess his Ryan. This showed LV EF 55%, D-shaped septum, moderate RV dysfunction, severe functional Ryan (suspect atrial functional, also significant posterior leaflet restriction), mild TR, moderate aortic stenosis. Structural heart team consulted. On 10/10/22, he underwent successful mTEER with residual 1+ Ryan. He was discharged home on 10/11/22 after a prolonged hospitalization. D/c wt 225 lb.  He presents today for followup of CHF.  He has noted increased leg edema recently and weight is up 17 lbs since last appointment.  Generally no dyspnea walking on flat ground, but he gets short of breath walking up hills/stairs. Able to get out to mow his grass.  No orthopnea/PND.  No lightheadedness.    ECG (personally reviewed): atrial fibrillation rate 85, PVC, iRBBB  Labs (4/24):  hgb 9.9, K 4.3, creatinine 1.55  Review of Systems: All systems reviewed and negative except as per HPI.   PMH: 1. Atrial fibrillation: Permanent.  2. H/o COVID-19 infection 3. CAD: STEMI in 2021 with DES to 99% mLCx.   4. Type 2 diabetes 5. HTN 6. Hyperlipidemia 7. OSA:  8. ABIs normal in 2/24.  9. Paralyzed vocal cords with hoarseness 10. Mitral regurgitation: ?Atrial functional  - Echo (2/24): EF 50-55%, D-shaped septum, moderate-severe RV enlargement with moderate RV dysfunction, PASP 69, severe biatrial enlargement, severe mitral regurgitation with restricted posterior mitral leaflet, EROA 0.51 cm2 by PISA, moderate AS with mean gradient 21 mmHg/AVA 1.2 cm^2, IVC dilated. - TEE (3/24): LV EF 55%, D-shaped septum, moderate RV dysfunction, severe functional Ryan (suspect atrial functional, also significant posterior leaflet restriction), mild TR, moderate aortic stenosis.  - mTEER (3/24) with 2 Pascal devices.  11. Chronic diastolic CHF with prominent RV dysfunction: Echo (4/24) with EF 50-55%, mild-moderate Ryan s/p mTEER with mean gradient 6 mmHg, severe AS with mean gradient 21 mmHg, AVA 0.8 cm^2, DI 0.25.  12. Aortic stenosis: Possible low flow/low gradient severe AS by 4/24 echo.  13. Rectus sheath hematoma: Spontaneous, 3/24.  14. CKD stage 3   Current Outpatient Medications  Medication Sig Dispense Refill   albuterol (VENTOLIN HFA) 108 (90 Base) MCG/ACT inhaler Inhale 2 puffs into the lungs every 6 (six) hours as needed for wheezing or shortness of breath. 8 g 0   apixaban (ELIQUIS) 5 MG TABS tablet Take 1 tablet (5 mg total) by mouth 2 (two) times daily. 180 tablet 1   ascorbic acid (VITAMIN C) 1000 MG tablet Take 1 tablet (1,000 mg total) by mouth daily. 60 tablet 1     atorvastatin (LIPITOR) 80 MG tablet Take 1 tablet by mouth once daily 90 tablet 2   metoprolol tartrate (LOPRESSOR) 50 MG tablet Take 50 mg by mouth 2 (two) times daily.     midodrine (PROAMATINE) 2.5 MG tablet  Take 1 tablet (2.5 mg total) by mouth 3 (three) times daily with meals. 90 tablet 6   Multiple Vitamin (MULTIVITAMIN WITH MINERALS) TABS tablet Take 1 tablet by mouth daily.     nitroGLYCERIN (NITROSTAT) 0.4 MG SL tablet Place 1 tablet (0.4 mg total) under the tongue every 5 (five) minutes x 3 doses as needed for chest pain. 25 tablet 2   Tiotropium Bromide Monohydrate (SPIRIVA RESPIMAT) 1.25 MCG/ACT AERS Inhale 2 puffs into the lungs daily. 4 g 3   torsemide (DEMADEX) 20 MG tablet Take 4 tablets (80 mg total) by mouth 2 (two) times daily. 240 tablet 6   zinc sulfate 220 (50 Zn) MG capsule Take 1 capsule (220 mg total) by mouth daily. 60 capsule 0   metolazone (ZAROXOLYN) 2.5 MG tablet Take 1 tablet (2.5 mg total) by mouth once a week. Every saturday 15 tablet 3   nutrition supplement, JUVEN, (JUVEN) PACK Take 1 packet by mouth 2 (two) times daily between meals. (Patient not taking: Reported on 12/14/2022) 60 packet 1   nystatin (MYCOSTATIN/NYSTOP) powder Apply 1 Application topically 3 (three) times daily. (Patient not taking: Reported on 12/14/2022) 15 g 0   potassium chloride SA (KLOR-CON M20) 20 MEQ tablet TAKE 2 TABLETS BY MOUTH ON MONDAY, WEDNESDAY AND FRIDAY, THEN 1 TABLET ALL OTHER DAYS 120 tablet 5   spironolactone (ALDACTONE) 25 MG tablet Take 1 tablet (25 mg total) by mouth daily. 90 tablet 3   No current facility-administered medications for this encounter.    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  Tobacco Use   Smoking status: Former    Types: Cigarettes    Quit date: 04/02/1959    Years since quitting: 63.7   Smokeless tobacco: Never  Vaping Use   Vaping Use: Never used  Substance and Sexual Activity   Alcohol use: No   Drug use: No   Sexual activity: Yes  Other Topics Concern   Not on file  Social History Narrative   Not on file   Social Determinants of Health   Financial Resource Strain: Low Risk  (10/19/2022)   Overall Financial Resource Strain (CARDIA)    Difficulty of Paying Living Expenses: Not hard at all  Food Insecurity: No Food Insecurity (10/19/2022)   Hunger Vital Sign    Worried About Running Out of Food in the Last Year: Never true    Ran Out of Food in the Last Year: Never true  Transportation Needs: No Transportation Needs (10/19/2022)   PRAPARE - Transportation    Lack of Transportation (Medical): No    Lack of Transportation (Non-Medical): No  Physical Activity: Inactive (10/14/2022)   Exercise Vital   Sign    Days of Exercise per Week: 0 days    Minutes of Exercise per Session: 30 min  Stress: No Stress Concern Present (10/19/2022)   [employer] - Occupational Stress Questionnaire    Feeling of Stress : Not at all  Social Connections: Moderately Integrated (10/14/2022)   Social Connection and Isolation Panel [NHANES]    Frequency of Communication with Friends and Family: More than three times a week    Frequency of Social Gatherings with Friends and Family: Once a week    Attends Religious Services: More than 4 times per year    Active Member of Clubs or Organizations: Yes    Attends Club or Organization Meetings: More than 4 times per year    Marital Status: Widowed  Intimate Partner Violence: Not At Risk (10/07/2022)   Humiliation, Afraid, Rape, and Kick questionnaire    Fear of Current or Ex-Partner: No    Emotionally Abused: No    Physically Abused: No    Sexually Abused: No      Family History  Problem  Relation Age of Onset   Cancer Mother        lungs   Coronary artery disease Mother    Diabetes Brother    Heart attack Brother    Diabetes Maternal Grandmother    Arthritis Father     Vitals:   12/14/22 1126  BP: 110/70  Pulse: 60  SpO2: 99%   PHYSICAL EXAM: General: NAD Neck: JVP 10-11 cm with HJR, no thyromegaly or thyroid nodule.  Lungs: Clear to auscultation bilaterally with normal respiratory effort. CV: Nondisplaced PMI.  Heart irregular S1/S2, no S3/S4, 2/6 SEM RUSB.  2+ edema 1/2 to knees bilaterally.  No carotid bruit.  Difficult to palpate pedal pulses.  Abdomen: Soft, nontender, no hepatosplenomegaly, no distention.  Skin: Intact without lesions or rashes.  Neurologic: Alert and oriented x 3.  Psych: Normal affect. Extremities: No clubbing or cyanosis.  HEENT: Normal.    ASSESSMENT & PLAN:  1. Chronic diastolic CHF: With prominent RV dysfunction. Recent hospitalization 2/24 with marked volume overload with low output, requiring RV inotropic support with dobutamine, he had >90 lbs off via diuresis.  Severe mitral regurgitation contributed to CHF, now s/p mTEER.  Most recent echo in 4/24 showed EF 50-55%, mild-moderate Ryan s/p mTEER with mean gradient 6 mmHg, severe AS with mean gradient 21 mmHg, AVA 0.8 cm^2, DI 0.25. He is starting to regain fluid, weight up 17 lbs and volume overloaded on exam.  Severe aortic stenosis now may be playing a role in CHF.  NYHA class III.  - Continue po torsemide 80 bid, take metolazone 2.5 mg once weekly on Saturdays with extra 40 mEq KCl.  BMET/BNP today, BMET 10 days.  - Increase spironolactone to 25 mg daily.  - Add Farxiga 10 mg daily.  - On midodrine 2.5 tid, hopefully will be able to stop soon.  - recommended TED hose  2. Mitral regurgitation: Severe functional Ryan by echo 2/24.  TEE in 3/24 showed LV EF 55%, D-shaped septum, moderate RV dysfunction, severe functional Ryan (suspect atrial functional, also significant posterior leaflet  restriction), mild TR, moderate aortic stenosis.  He is now s/p mTEER with 2 Pascal devices.  Echo in 4/24 showed mean MV gradient 6 mmHg with mild-moderate residual Ryan.  - Will need antibiotic prophylaxis with dental work.  3. CKD stage 3: BMET today.  4. Atrial fibrillation: Permanent, has been seen by EP in the   past. Not likely to successfully cardiovert and stay in NSR.   - Continue Eliquis. Check CBC today.  5. CAD:  STEMI with DES to mLCx in 10/21.  No chest pain.  - Continue atorvastatin, check lipids today.  - No ASA with Eliquis use.  6. LE Wounds: Left LE wrapped, followed by wound care and Dr. Duda. ABIs were normal in 2/24.  8. Aortic stenosis: Echo in 4/24 appeared to show severe low flow/low gradient aortic stenosis. Increased forward flow with improvement in Ryan may have made aortic stenosis more obvious.  Will need to consider TAVR.  - I will arrange for TEE for better view of aortic valve (is AS truly severe and in need of TAVR?) => Discussed risks/benefits with patient and he agrees to procedure. - I will arrange for RHC/LHC as part of pre-TAVR evaluation, also to assess filling pressures with increased diuresis as above. He will need to hold Eliquis day before and day of cath. Discussed risks/benefits with patient and he agrees to procedure.  Followup in 1 month.   Miko Markwood, MD 12/15/22  

## 2022-12-15 NOTE — Progress Notes (Signed)
Advanced Heart Failure Clinic Note   Referring Physician: PCP: [name], [name], MD PCP-Cardiologist: [name], MD  HF Cardiology: Dr. Shirlee Latch  HPI:  Ryan Garner is 77 year old with a history of CAD with STEMI 2021 and DES to LCx, permanent atrial fibrillation, severe Ryan, HTN, HLD, sleep apnea, and HFpEF.    Admitted 2/24 with A/C HFpEF and RV failure. Echo showed EF 50-55%, D-shaped septum, moderate-severe RV enlargement with moderate RV dysfunction, PASP 69, severe biatrial enlargement, severe mitral regurgitation with restricted posterior mitral leaflet, EROA 0.51 cm2 by PISA, moderate AS with mean gradient 21 mmHg/AVA 1.2 cm^2, IVC dilated. Diuresed with IV lasix with poor response, hypotension and worsening renal function. Dobutamine added for RV support. Diuresis improved. Overall diuresed > 90 pounds. Transitioned to torsemide 80 mg twice daily.  Hospitalization complicated by development of acute abdominal pain. Sent for stat CT abdomen/pelvis and this showed spontaneous rectus sheath hematoma. Anticoagulants held w/ gradual improvement. Anticoagulants ultimately restarted.  Once stabilized, he underwent TEE to further assess his Ryan. This showed LV EF 55%, D-shaped septum, moderate RV dysfunction, severe functional Ryan (suspect atrial functional, also significant posterior leaflet restriction), mild TR, moderate aortic stenosis. Structural heart team consulted. On 10/10/22, he underwent successful mTEER with residual 1+ Ryan. He was discharged home on 10/11/22 after a prolonged hospitalization. D/c wt 225 lb.  He presents today for followup of CHF.  He has noted increased leg edema recently and weight is up 17 lbs since last appointment.  Generally no dyspnea walking on flat ground, but he gets short of breath walking up hills/stairs. Able to get out to mow his grass.  No orthopnea/PND.  No lightheadedness.    ECG (personally reviewed): atrial fibrillation rate 85, PVC, iRBBB  Labs (4/24):  hgb 9.9, K 4.3, creatinine 1.55  Review of Systems: All systems reviewed and negative except as per HPI.   PMH: 1. Atrial fibrillation: Permanent.  2. H/o COVID-19 infection 3. CAD: STEMI in 2021 with DES to 99% mLCx.   4. Type 2 diabetes 5. HTN 6. Hyperlipidemia 7. OSA:  8. ABIs normal in 2/24.  9. Paralyzed vocal cords with hoarseness 10. Mitral regurgitation: ?Atrial functional  - Echo (2/24): EF 50-55%, D-shaped septum, moderate-severe RV enlargement with moderate RV dysfunction, PASP 69, severe biatrial enlargement, severe mitral regurgitation with restricted posterior mitral leaflet, EROA 0.51 cm2 by PISA, moderate AS with mean gradient 21 mmHg/AVA 1.2 cm^2, IVC dilated. - TEE (3/24): LV EF 55%, D-shaped septum, moderate RV dysfunction, severe functional Ryan (suspect atrial functional, also significant posterior leaflet restriction), mild TR, moderate aortic stenosis.  - mTEER (3/24) with 2 Pascal devices.  11. Chronic diastolic CHF with prominent RV dysfunction: Echo (4/24) with EF 50-55%, mild-moderate Ryan s/p mTEER with mean gradient 6 mmHg, severe AS with mean gradient 21 mmHg, AVA 0.8 cm^2, DI 0.25.  12. Aortic stenosis: Possible low flow/low gradient severe AS by 4/24 echo.  13. Rectus sheath hematoma: Spontaneous, 3/24.  14. CKD stage 3   Current Outpatient Medications  Medication Sig Dispense Refill   albuterol (VENTOLIN HFA) 108 (90 Base) MCG/ACT inhaler Inhale 2 puffs into the lungs every 6 (six) hours as needed for wheezing or shortness of breath. 8 g 0   apixaban (ELIQUIS) 5 MG TABS tablet Take 1 tablet (5 mg total) by mouth 2 (two) times daily. 180 tablet 1   ascorbic acid (VITAMIN C) 1000 MG tablet Take 1 tablet (1,000 mg total) by mouth daily. 60 tablet 1  atorvastatin (LIPITOR) 80 MG tablet Take 1 tablet by mouth once daily 90 tablet 2   metoprolol tartrate (LOPRESSOR) 50 MG tablet Take 50 mg by mouth 2 (two) times daily.     midodrine (PROAMATINE) 2.5 MG tablet  Take 1 tablet (2.5 mg total) by mouth 3 (three) times daily with meals. 90 tablet 6   Multiple Vitamin (MULTIVITAMIN WITH MINERALS) TABS tablet Take 1 tablet by mouth daily.     nitroGLYCERIN (NITROSTAT) 0.4 MG SL tablet Place 1 tablet (0.4 mg total) under the tongue every 5 (five) minutes x 3 doses as needed for chest pain. 25 tablet 2   Tiotropium Bromide Monohydrate (SPIRIVA RESPIMAT) 1.25 MCG/ACT AERS Inhale 2 puffs into the lungs daily. 4 g 3   torsemide (DEMADEX) 20 MG tablet Take 4 tablets (80 mg total) by mouth 2 (two) times daily. 240 tablet 6   zinc sulfate 220 (50 Zn) MG capsule Take 1 capsule (220 mg total) by mouth daily. 60 capsule 0   metolazone (ZAROXOLYN) 2.5 MG tablet Take 1 tablet (2.5 mg total) by mouth once a week. Every saturday 15 tablet 3   nutrition supplement, JUVEN, (JUVEN) PACK Take 1 packet by mouth 2 (two) times daily between meals. (Patient not taking: Reported on 12/14/2022) 60 packet 1   nystatin (MYCOSTATIN/NYSTOP) powder Apply 1 Application topically 3 (three) times daily. (Patient not taking: Reported on 12/14/2022) 15 g 0   potassium chloride SA (KLOR-CON M20) 20 MEQ tablet TAKE 2 TABLETS BY MOUTH ON MONDAY, WEDNESDAY AND FRIDAY, THEN 1 TABLET ALL OTHER DAYS 120 tablet 5   spironolactone (ALDACTONE) 25 MG tablet Take 1 tablet (25 mg total) by mouth daily. 90 tablet 3   No current facility-administered medications for this encounter.    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, environmental  Tobacco Use   Smoking status: Former    Types: Cigarettes    Quit date: 04/02/1959    Years since quitting: 63.7   Smokeless tobacco: Never  Vaping Use   Vaping Use: Never used  Substance and Sexual Activity   Alcohol use: No   Drug use: No   Sexual activity: Yes  Other Topics Concern   Not on file  Social History Narrative   Not on file   Social Determinants of Health   Financial Resource Strain: Low Risk  (10/19/2022)   Overall Financial Resource Strain (CARDIA)    Difficulty of Paying Living Expenses: Not hard at all  Food Insecurity: No Food Insecurity (10/19/2022)   Hunger Vital Sign    Worried About Running Out of Food in the Last Year: Never true    Ran Out of Food in the Last Year: Never true  Transportation Needs: No Transportation Needs (10/19/2022)   PRAPARE - Administrator, Civil Service (Medical): No    Lack of Transportation (Non-Medical): No  Physical Activity: Inactive (10/14/2022)   Exercise Vital  Sign    Days of Exercise per Week: 0 days    Minutes of Exercise per Session: 30 min  Stress: No Stress Concern Present (10/19/2022)   [name]    Feeling of Stress : Not at all  Social Connections: Moderately Integrated (10/14/2022)   Social Connection and Isolation Panel [NHANES]    Frequency of Communication with Friends and Family: More than three times a week    Frequency of Social Gatherings with Friends and Family: Once a week    Attends Religious Services: More than 4 times per year    Active Member of [employer] or Organizations: Yes    Attends Banker Meetings: More than 4 times per year    Marital Status: Widowed  Intimate Partner Violence: Not At Risk (10/07/2022)   Humiliation, Afraid, Rape, and Kick questionnaire    Fear of Current or Ex-Partner: No    Emotionally Abused: No    Physically Abused: No    Sexually Abused: No      Family History  Problem  Relation Age of Onset   Cancer Mother        lungs   Coronary artery disease Mother    Diabetes Brother    Heart attack Brother    Diabetes Maternal Grandmother    Arthritis Father     Vitals:   12/14/22 1126  BP: 110/70  Pulse: 60  SpO2: 99%   PHYSICAL EXAM: General: NAD Neck: JVP 10-11 cm with HJR, no thyromegaly or thyroid nodule.  Lungs: Clear to auscultation bilaterally with normal respiratory effort. CV: Nondisplaced PMI.  Heart irregular S1/S2, no S3/S4, 2/6 SEM RUSB.  2+ edema 1/2 to knees bilaterally.  No carotid bruit.  Difficult to palpate pedal pulses.  Abdomen: Soft, nontender, no hepatosplenomegaly, no distention.  Skin: Intact without lesions or rashes.  Neurologic: Alert and oriented x 3.  Psych: Normal affect. Extremities: No clubbing or cyanosis.  HEENT: Normal.    ASSESSMENT & PLAN:  1. Chronic diastolic CHF: With prominent RV dysfunction. Recent hospitalization 2/24 with marked volume overload with low output, requiring RV inotropic support with dobutamine, he had >90 lbs off via diuresis.  Severe mitral regurgitation contributed to CHF, now s/p mTEER.  Most recent echo in 4/24 showed EF 50-55%, mild-moderate Ryan s/p mTEER with mean gradient 6 mmHg, severe AS with mean gradient 21 mmHg, AVA 0.8 cm^2, DI 0.25. He is starting to regain fluid, weight up 17 lbs and volume overloaded on exam.  Severe aortic stenosis now may be playing a role in CHF.  NYHA class III.  - Continue po torsemide 80 bid, take metolazone 2.5 mg once weekly on Saturdays with extra 40 mEq KCl.  BMET/BNP today, BMET 10 days.  - Increase spironolactone to 25 mg daily.  - Add Farxiga 10 mg daily.  - On midodrine 2.5 tid, hopefully will be able to stop soon.  - recommended TED hose  2. Mitral regurgitation: Severe functional Ryan by echo 2/24.  TEE in 3/24 showed LV EF 55%, D-shaped septum, moderate RV dysfunction, severe functional Ryan (suspect atrial functional, also significant posterior leaflet  restriction), mild TR, moderate aortic stenosis.  He is now s/p mTEER with 2 Pascal devices.  Echo in 4/24 showed mean MV gradient 6 mmHg with mild-moderate residual Ryan.  - Will need antibiotic prophylaxis with dental work.  3. CKD stage 3: BMET today.  4. Atrial fibrillation: Permanent, has been seen by EP in the  past. Not likely to successfully cardiovert and stay in NSR.   - Continue Eliquis. Check CBC today.  5. CAD:  STEMI with DES to mLCx in 10/21.  No chest pain.  - Continue atorvastatin, check lipids today.  - No ASA with Eliquis use.  6. LE Wounds: Left LE wrapped, followed by wound care and Dr. Lajoyce Corners. ABIs were normal in 2/24.  8. Aortic stenosis: Echo in 4/24 appeared to show severe low flow/low gradient aortic stenosis. Increased forward flow with improvement in Ryan may have made aortic stenosis more obvious.  Will need to consider TAVR.  - I will arrange for TEE for better view of aortic valve (is AS truly severe and in need of TAVR?) => Discussed risks/benefits with patient and he agrees to procedure. - I will arrange for RHC/LHC as part of pre-TAVR evaluation, also to assess filling pressures with increased diuresis as above. He will need to hold Eliquis day before and day of cath. Discussed risks/benefits with patient and he agrees to procedure.  Followup in 1 month.   Marca Ancona, MD 12/15/22

## 2022-12-21 ENCOUNTER — Telehealth (HOSPITAL_COMMUNITY): Payer: Self-pay | Admitting: *Deleted

## 2022-12-21 ENCOUNTER — Other Ambulatory Visit (HOSPITAL_COMMUNITY)
Admission: RE | Admit: 2022-12-21 | Discharge: 2022-12-21 | Disposition: A | Discharge status: Home / Self Care | Payer: Medicare Other | Source: Ambulatory Visit | Attending: Cardiology | Admitting: Cardiology

## 2022-12-21 DIAGNOSIS — I5032 Chronic diastolic (congestive) heart failure: Secondary | ICD-10-CM | POA: Diagnosis not present

## 2022-12-21 DIAGNOSIS — N1831 Chronic kidney disease, stage 3a: Secondary | ICD-10-CM

## 2022-12-21 LAB — BASIC METABOLIC PANEL
Anion gap: 11 (ref 5–15)
BUN: 70 mg/dL — ABNORMAL HIGH (ref 8–23)
CO2: 28 mmol/L (ref 22–32)
Calcium: 9.6 mg/dL (ref 8.9–10.3)
Chloride: 91 mmol/L — ABNORMAL LOW (ref 98–111)
Creatinine, Ser: 2.02 mg/dL — ABNORMAL HIGH (ref 0.61–1.24)
GFR, Estimated: 33 mL/min — ABNORMAL LOW (ref >60)
Glucose, Bld: 102 mg/dL — ABNORMAL HIGH (ref 70–99)
Potassium: 4.9 mmol/L (ref 3.5–5.1)
Sodium: 130 mmol/L — ABNORMAL LOW (ref 135–145)

## 2022-12-21 MED ORDER — TORSEMIDE 20 MG PO TABS: 60.0000 mg | ORAL_TABLET | Freq: Two times a day (BID) | ORAL | 6 refills | Status: DC

## 2022-12-21 MED ORDER — POTASSIUM CHLORIDE CRYS ER 20 MEQ PO TBCR: 20.0000 meq | EXTENDED_RELEASE_TABLET | Freq: Every day | ORAL | 5 refills | Status: DC

## 2022-12-21 NOTE — Telephone Encounter (Signed)
Geroge Baseman [place], RN 12/21/2022  5:11 PM EDT Back to Top    Spoke w/pt, he is aware, agreeable, and verbalized understanding, med list updated, repeat lab sch for Tue 6/5, cath sch 6/5

## 2022-12-21 NOTE — Telephone Encounter (Signed)
-----   Message from Laurey Morale, MD sent at 12/21/2022  3:58 PM EDT ----- Creatinine is up.  Make sure he is not taking NSAIDs.  Decrease torsemide to 60 mg bid.  Decrease KCl to 20 daily (alternates 20 and 40).  Get a BMET before cath early next week.

## 2022-12-24 ENCOUNTER — Other Ambulatory Visit (HOSPITAL_COMMUNITY): Payer: Self-pay

## 2022-12-25 ENCOUNTER — Ambulatory Visit (HOSPITAL_COMMUNITY)
Admission: RE | Admit: 2022-12-25 | Discharge: 2022-12-25 | Disposition: A | Discharge status: Home / Self Care | Payer: Medicare Other | Source: Ambulatory Visit | Attending: Cardiology | Admitting: Cardiology

## 2022-12-25 ENCOUNTER — Telehealth (HOSPITAL_COMMUNITY): Payer: Self-pay

## 2022-12-25 DIAGNOSIS — I5032 Chronic diastolic (congestive) heart failure: Secondary | ICD-10-CM | POA: Insufficient documentation

## 2022-12-25 DIAGNOSIS — N1831 Chronic kidney disease, stage 3a: Secondary | ICD-10-CM | POA: Diagnosis not present

## 2022-12-25 LAB — BASIC METABOLIC PANEL
Anion gap: 12 (ref 5–15)
BUN: 74 mg/dL — ABNORMAL HIGH (ref 8–23)
CO2: 27 mmol/L (ref 22–32)
Calcium: 9.7 mg/dL (ref 8.9–10.3)
Chloride: 91 mmol/L — ABNORMAL LOW (ref 98–111)
Creatinine, Ser: 2.29 mg/dL — ABNORMAL HIGH (ref 0.61–1.24)
GFR, Estimated: 29 mL/min — ABNORMAL LOW (ref >60)
Glucose, Bld: 153 mg/dL — ABNORMAL HIGH (ref 70–99)
Potassium: 4.4 mmol/L (ref 3.5–5.1)
Sodium: 130 mmol/L — ABNORMAL LOW (ref 135–145)

## 2022-12-25 NOTE — Progress Notes (Addendum)
Spoke with  patient, Procedure scheduled for 1130, Please arrive at the hospital at 1030, NPO after midnight on Tuesday, May take meds with sips of water in the AM, please have transportation for home post procedure, and someone to stay with pt for approximately 24 hours after   Pt states he took [place] on Monday morning  Cath at 1300

## 2022-12-25 NOTE — Telephone Encounter (Signed)
Left message for patient to remind him of procedures scheduled for tomorrow. Number given for any questions to call office.

## 2022-12-26 ENCOUNTER — Ambulatory Visit (HOSPITAL_BASED_OUTPATIENT_CLINIC_OR_DEPARTMENT_OTHER)
Admission: RE | Admit: 2022-12-26 | Discharge: 2022-12-26 | Disposition: A | Discharge status: Home / Self Care | Payer: Medicare Other | Source: Ambulatory Visit | Attending: Cardiology | Admitting: Cardiology

## 2022-12-26 ENCOUNTER — Encounter (HOSPITAL_COMMUNITY)
Admission: RE | Disposition: A | Discharge status: Home / Self Care | Payer: Self-pay | Source: Home / Self Care | Attending: Cardiology

## 2022-12-26 ENCOUNTER — Encounter (HOSPITAL_COMMUNITY): Payer: Self-pay | Admitting: Cardiology

## 2022-12-26 ENCOUNTER — Ambulatory Visit (HOSPITAL_COMMUNITY): Payer: Medicare Other | Admitting: Certified Registered"

## 2022-12-26 ENCOUNTER — Ambulatory Visit (HOSPITAL_COMMUNITY)
Admission: RE | Admit: 2022-12-26 | Discharge: 2022-12-26 | Disposition: A | Discharge status: Home / Self Care | Payer: Medicare Other | Attending: Cardiology | Admitting: Cardiology

## 2022-12-26 ENCOUNTER — Other Ambulatory Visit: Payer: Self-pay

## 2022-12-26 ENCOUNTER — Ambulatory Visit (HOSPITAL_BASED_OUTPATIENT_CLINIC_OR_DEPARTMENT_OTHER): Payer: Medicare Other | Admitting: Certified Registered"

## 2022-12-26 DIAGNOSIS — Z87891 Personal history of nicotine dependence: Secondary | ICD-10-CM | POA: Diagnosis not present

## 2022-12-26 DIAGNOSIS — I110 Hypertensive heart disease with heart failure: Secondary | ICD-10-CM | POA: Diagnosis not present

## 2022-12-26 DIAGNOSIS — I4821 Permanent atrial fibrillation: Secondary | ICD-10-CM | POA: Diagnosis not present

## 2022-12-26 DIAGNOSIS — I130 Hypertensive heart and chronic kidney disease with heart failure and stage 1 through stage 4 chronic kidney disease, or unspecified chronic kidney disease: Secondary | ICD-10-CM | POA: Insufficient documentation

## 2022-12-26 DIAGNOSIS — N183 Chronic kidney disease, stage 3 unspecified: Secondary | ICD-10-CM | POA: Insufficient documentation

## 2022-12-26 DIAGNOSIS — I080 Rheumatic disorders of both mitral and aortic valves: Secondary | ICD-10-CM | POA: Diagnosis not present

## 2022-12-26 DIAGNOSIS — I252 Old myocardial infarction: Secondary | ICD-10-CM

## 2022-12-26 DIAGNOSIS — I509 Heart failure, unspecified: Secondary | ICD-10-CM

## 2022-12-26 DIAGNOSIS — Z955 Presence of coronary angioplasty implant and graft: Secondary | ICD-10-CM | POA: Diagnosis not present

## 2022-12-26 DIAGNOSIS — E1122 Type 2 diabetes mellitus with diabetic chronic kidney disease: Secondary | ICD-10-CM | POA: Insufficient documentation

## 2022-12-26 DIAGNOSIS — E785 Hyperlipidemia, unspecified: Secondary | ICD-10-CM | POA: Diagnosis not present

## 2022-12-26 DIAGNOSIS — G4733 Obstructive sleep apnea (adult) (pediatric): Secondary | ICD-10-CM | POA: Diagnosis not present

## 2022-12-26 DIAGNOSIS — I350 Nonrheumatic aortic (valve) stenosis: Secondary | ICD-10-CM

## 2022-12-26 DIAGNOSIS — I5033 Acute on chronic diastolic (congestive) heart failure: Secondary | ICD-10-CM | POA: Diagnosis not present

## 2022-12-26 DIAGNOSIS — Z7901 Long term (current) use of anticoagulants: Secondary | ICD-10-CM | POA: Insufficient documentation

## 2022-12-26 DIAGNOSIS — Z79899 Other long term (current) drug therapy: Secondary | ICD-10-CM | POA: Diagnosis not present

## 2022-12-26 DIAGNOSIS — I2510 Atherosclerotic heart disease of native coronary artery without angina pectoris: Secondary | ICD-10-CM | POA: Insufficient documentation

## 2022-12-26 DIAGNOSIS — I5032 Chronic diastolic (congestive) heart failure: Secondary | ICD-10-CM

## 2022-12-26 HISTORY — PX: TEE WITHOUT CARDIOVERSION: SHX5443

## 2022-12-26 HISTORY — PX: RIGHT HEART CATH: CATH118263

## 2022-12-26 LAB — POCT I-STAT EG7
Acid-Base Excess: 0 mmol/L (ref 0.0–2.0)
Acid-Base Excess: 0 mmol/L (ref 0.0–2.0)
Bicarbonate: 24.6 mmol/L (ref 20.0–28.0)
Bicarbonate: 24.8 mmol/L (ref 20.0–28.0)
Calcium, Ion: 1.21 mmol/L (ref 1.15–1.40)
Calcium, Ion: 1.22 mmol/L (ref 1.15–1.40)
HCT: 32 % — ABNORMAL LOW (ref 39.0–52.0)
HCT: 33 % — ABNORMAL LOW (ref 39.0–52.0)
Hemoglobin: 10.9 g/dL — ABNORMAL LOW (ref 13.0–17.0)
Hemoglobin: 11.2 g/dL — ABNORMAL LOW (ref 13.0–17.0)
O2 Saturation: 75 %
O2 Saturation: 76 %
Potassium: 3.9 mmol/L (ref 3.5–5.1)
Potassium: 3.9 mmol/L (ref 3.5–5.1)
Sodium: 135 mmol/L (ref 135–145)
Sodium: 135 mmol/L (ref 135–145)
TCO2: 26 mmol/L (ref 22–32)
TCO2: 26 mmol/L (ref 22–32)
pCO2, Ven: 40.4 mmHg — ABNORMAL LOW (ref 44–60)
pCO2, Ven: 40.5 mmHg — ABNORMAL LOW (ref 44–60)
pH, Ven: 7.393 (ref 7.25–7.43)
pH, Ven: 7.396 (ref 7.25–7.43)
pO2, Ven: 40 mmHg (ref 32–45)
pO2, Ven: 41 mmHg (ref 32–45)

## 2022-12-26 LAB — BASIC METABOLIC PANEL
Anion gap: 12 (ref 5–15)
BUN: 80 mg/dL — ABNORMAL HIGH (ref 8–23)
CO2: 26 mmol/L (ref 22–32)
Calcium: 10 mg/dL (ref 8.9–10.3)
Chloride: 94 mmol/L — ABNORMAL LOW (ref 98–111)
Creatinine, Ser: 2.27 mg/dL — ABNORMAL HIGH (ref 0.61–1.24)
GFR, Estimated: 29 mL/min — ABNORMAL LOW (ref >60)
Glucose, Bld: 119 mg/dL — ABNORMAL HIGH (ref 70–99)
Potassium: 4.4 mmol/L (ref 3.5–5.1)
Sodium: 132 mmol/L — ABNORMAL LOW (ref 135–145)

## 2022-12-26 LAB — GLUCOSE, CAPILLARY: Glucose-Capillary: 111 mg/dL — ABNORMAL HIGH (ref 70–99)

## 2022-12-26 SURGERY — ECHOCARDIOGRAM, TRANSESOPHAGEAL
Anesthesia: Monitor Anesthesia Care

## 2022-12-26 SURGERY — RIGHT HEART CATH
Anesthesia: LOCAL

## 2022-12-26 MED ORDER — SODIUM CHLORIDE 0.9% FLUSH: 3.0000 mL | Freq: Two times a day (BID) | INTRAVENOUS | Status: DC

## 2022-12-26 MED ORDER — LIDOCAINE 2% (20 MG/ML) 5 ML SYRINGE
INTRAMUSCULAR | Status: DC | PRN
  Administered 2022-12-26: 60 mg via INTRAVENOUS

## 2022-12-26 MED ORDER — SODIUM CHLORIDE 0.9 % IV SOLN: INTRAVENOUS | Status: DC

## 2022-12-26 MED ORDER — HEPARIN (PORCINE) IN NACL 1000-0.9 UT/500ML-% IV SOLN
INTRAVENOUS | Status: DC | PRN
  Administered 2022-12-26: 500 mL

## 2022-12-26 MED ORDER — ONDANSETRON HCL 4 MG/2ML IJ SOLN: 4.0000 mg | Freq: Four times a day (QID) | INTRAMUSCULAR | Status: DC | PRN

## 2022-12-26 MED ORDER — LABETALOL HCL 5 MG/ML IV SOLN: 10.0000 mg | INTRAVENOUS | Status: DC | PRN

## 2022-12-26 MED ORDER — SODIUM CHLORIDE 0.9% FLUSH: 3.0000 mL | INTRAVENOUS | Status: DC | PRN

## 2022-12-26 MED ORDER — ACETAMINOPHEN 325 MG PO TABS: 650.0000 mg | ORAL_TABLET | ORAL | Status: DC | PRN

## 2022-12-26 MED ORDER — HYDRALAZINE HCL 20 MG/ML IJ SOLN: 10.0000 mg | INTRAMUSCULAR | Status: DC | PRN

## 2022-12-26 MED ORDER — PROPOFOL 500 MG/50ML IV EMUL
INTRAVENOUS | Status: DC | PRN
  Administered 2022-12-26: 100 ug/kg/min via INTRAVENOUS

## 2022-12-26 MED ORDER — SODIUM CHLORIDE 0.9 % IV SOLN: 250.0000 mL | INTRAVENOUS | Status: DC | PRN

## 2022-12-26 MED ORDER — SODIUM CHLORIDE 0.9% FLUSH
3.0000 mL | Freq: Two times a day (BID) | INTRAVENOUS | Status: DC
  Administered 2022-12-26: 3 mL via INTRAVENOUS

## 2022-12-26 MED ORDER — PROPOFOL 10 MG/ML IV BOLUS
INTRAVENOUS | Status: DC | PRN
  Administered 2022-12-26: 70 mg via INTRAVENOUS

## 2022-12-26 MED ORDER — LIDOCAINE HCL (PF) 1 % IJ SOLN
INTRAMUSCULAR | Status: DC | PRN
  Administered 2022-12-26: 2 mL

## 2022-12-26 SURGICAL SUPPLY — 6 items
CATH BALLN WEDGE 5F 110CM (CATHETERS) IMPLANT
KIT HEART LEFT (KITS) ×1 IMPLANT
PACK CARDIAC CATHETERIZATION (CUSTOM PROCEDURE TRAY) ×1 IMPLANT
SHEATH GLIDE SLENDER 4/5FR (SHEATH) IMPLANT
TRANSDUCER W/STOPCOCK (MISCELLANEOUS) ×1 IMPLANT
WIRE EMERALD 3MM-J .025X260CM (WIRE) IMPLANT

## 2022-12-26 NOTE — Anesthesia Preprocedure Evaluation (Addendum)
Anesthesia Evaluation  Patient identified by MRN, date of birth, ID band Patient awake    Reviewed: Allergy & Precautions, NPO status , Patient's Chart, lab work & pertinent test results  History of Anesthesia Complications Negative for: history of anesthetic complications  Airway Mallampati: II  TM Distance: >3 FB Neck ROM: Full    Dental no notable dental hx. (+) Dental Advisory Given   Pulmonary shortness of breath, sleep apnea , COPD, former smoker   breath sounds clear to auscultation + decreased breath sounds      Cardiovascular hypertension, Pt. on medications + CAD, + Past MI, + Cardiac Stents and +CHF  + dysrhythmias Atrial Fibrillation + Valvular Problems/Murmurs  Rhythm:Regular Rate:Normal + Systolic murmurs Echo 10/2022  1. S/P mitraclip x 2 (A2-P2, A3-P3) with mild to moderate MR and mean gradient 6 mmHg (HR 84). Calcified aortic valve with severe AS (mean gradient 21 mmHg; AVA 0.8 cm2; DI 0.25).   2. Left ventricular ejection fraction, by estimation, is 50 to 55%. The left ventricle has low normal function. The left ventricle has no regional wall motion abnormalities. The left ventricular internal cavity size was mildly dilated. There is mild left ventricular hypertrophy. Left ventricular diastolic function could not be evaluated. There is the interventricular septum is flattened in systole and diastole, consistent with right ventricular pressure and volume overload.   3. Right ventricular systolic function is moderately reduced. The right ventricular size is moderately enlarged.   4. Left atrial size was severely dilated.   5. Right atrial size was severely dilated.   6. The mitral valve is normal in structure. Mild to moderate mitral valve regurgitation. No evidence of mitral stenosis. Procedure Date: 10/10/22.   7. The aortic valve is calcified. Aortic valve regurgitation is not visualized. Severe aortic valve stenosis.    8. The inferior vena cava is dilated in size with >50% respiratory variability, suggesting right atrial pressure of 8 mmHg.   9. 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   Comparison(s): No significant change from prior study.     Echo 09/2022  1. Left ventricular ejection fraction, by estimation, is 40 to 45%. The left ventricle has mildly decreased function. The left ventricle demonstrates global hypokinesis. Left ventricular diastolic function could not be evaluated. There is the interventricular septum is flattened in diastole ('D' shaped left ventricle), consistent with right ventricular volume overload.   2. Right ventricular systolic function is moderately reduced. The right ventricular size is severely enlarged. There is normal pulmonary artery systolic pressure. The estimated right ventricular systolic pressure is 35.8 mmHg.   3. Left atrial size was severely dilated.   4. Right atrial size was severely dilated.   5. There are two stable, well seated mitral clips in the A2-P2 and A3-P3 positions. Two independent jets of residual MR are seen. The smaller jet is located between the two clips, the larger is lateral to the clips. The patient is in atrial fibrillation, which makes quantitation very challenging. PISA based calculations are not validated in this post-clip situation, but the cumulative ERO area is 0.25 cm sq, the cumulative regurgitant volume is 37 ml, the regurgitant fraction is 38%. The mitral valve has been repaired/replaced. Mild to moderate mitral valve regurgitation. The mean mitral valve gradient is 4.2 mmHg with average heart rate of 77 bpm. There is a Pascal ACE x2 present in the mitral position. Procedure Date: 10/10/2022.   6. Tricuspid valve  regurgitation is mild to moderate.   7. The aortic valve is tricuspid. There is moderate calcification of the aortic valve. There is moderate thickening of the  aortic valve. Aortic valve regurgitation is not visualized. Aortic valve mean gradient measures 22.0 mmHg. Aortic valve Vmax  measures 3.20 m/s.   8. There is mild dilatation of the ascending aorta, measuring 41 mm.   9. The inferior vena cava is dilated in size with <50% respiratory variability, suggesting right atrial pressure of 15 mmHg.  10. Evidence of atrial level shunting detected by color flow Doppler. 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      Ramus lesion is 75% stenosed.  Mid Cx lesion is 99% stenosed.  Post intervention, there is a 0% residual stenosis.  A drug-eluting stent was successfully placed using a STENT RESOLUTE ONYX 2.5X34.  The left ventricular systolic function is normal.  LV end diastolic pressure is mildly elevated.  The left ventricular ejection fraction is 55-65% by visual estimate.   1. 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 Good LV systolic function 3. Elevated LVEDP 4. Successful PCI of the mid to distal LCx with DES x 1.   Plan: ASA 81 mg daily for one month. Plavix 75 mg daily for one year. May resume Eliquis tomorrow. IV diuresis. Will need to stop Flecainide. Consider alternative AAD therapy.   Cath 1. Right and left heart filling pressures remain elevated.  Surprisingly, V-waves in PCWP tracing not prominent but LA is very large.  2. 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  3. Preserved cardiac output.  4. RV function reasonable with PAPi 2.9.    Continue aggressive diuresis. Timing of Mitraclip to be determined.     Neuro/Psych  Neuromuscular disease    GI/Hepatic negative GI ROS, Neg liver ROS,,,  Endo/Other  diabetes    Renal/GU Renal diseaseLab Results      Component                Value               Date                      CREATININE               1.52 (H)            10/10/2022                 Musculoskeletal  (+) Arthritis ,    Abdominal  (+) + obese  Peds  Hematology  (+) Blood dyscrasia, anemia Lab Results      Component                Value               Date                      WBC                      7.2                 10/10/2022                HGB  10.2 (L)            10/10/2022                HCT                      30.8 (L)            10/10/2022                MCV                      90.1                10/10/2022                PLT                      338                 10/10/2022             eliquis   Anesthesia Other Findings hoarsness  Reproductive/Obstetrics                             Anesthesia Physical Anesthesia Plan  ASA: 4  Anesthesia Plan: MAC   Post-op Pain Management: Minimal or no pain anticipated   Induction: Intravenous  PONV Risk Score and Plan: 2 and Ondansetron and Dexamethasone  Airway Management Planned: Natural Airway  Additional Equipment:   Intra-op Plan:   Post-operative Plan:   Informed Consent: I have reviewed the patients History and Physical, chart, labs and discussed the procedure including the risks, benefits and alternatives for the proposed anesthesia with the patient or authorized representative who has indicated his/her understanding and acceptance.     Dental advisory given  Plan Discussed with: CRNA  Anesthesia Plan Comments:        Anesthesia Quick Evaluation

## 2022-12-26 NOTE — CV Procedure (Signed)
Procedure: TEE  Sedation: Per anesthesiology  Indication: Aortic stenosis  Findings: Please see echo section for full report.  Normal LV size with EF 45-50%, mild diffuse hypokinesis.  Mild LV hypertrophy.  D-shaped interventricular septum suggests RV pressure/volume overload.  Moderately dilated RV with moderate systolic dysfunction.  Severe left atrial enlargement, no LA appendage thrombus.  Severe RA enlargement.  S/p MV repair via mTEER with 2 Pascal devices that are well-seated.  There is moderate residual mitral regurgitation.  There is no systolic flow reversal on pulmonary vein doppler interrogation.  Mild TR.  The aortic valve is severely calcified and trileaflet.  There is severe AS, 0.4 cm^2 by planimetry and 0.88 cm^2 by Continuity Equation with mean gradient 27 mmHg (low flow/low gradient severe AS).    Ryan Garner 12/26/2022 11:39 AM

## 2022-12-26 NOTE — Discharge Instructions (Signed)
Drink plenty of fluids for 48 hours and keep wrist elevated at heart level for 24 hours  Brachial Site Care   This sheet gives you information about how to care for yourself after your procedure. Your health care provider may also give you more specific instructions. If you have problems or questions, contact your health care provider. What can I expect after the procedure? After the procedure, it is common to have: Bruising and tenderness at the catheter insertion area. Follow these instructions at home: Medicines Take over-the-counter and prescription medicines only as told by your health care provider. Insertion site care Follow instructions from your health care provider about how to take care of your insertion site. Make sure you: Wash your hands with soap and water before you change your bandage (dressing). If soap and water are not available, use hand sanitizer. Remove your dressing 12/27/2022 at 3:00 pm Check your insertion site every day for signs of infection. Check for: Redness, swelling, or pain. Fluid or blood. Pus or a bad smell. Warmth. Do not take baths, swim, or use a hot tub until your health care provider approves. You may shower after removing dressing Pat the area dry with a clean towel. Do not rub the site. That could cause bleeding. Do not apply powder or lotion to the site. Activity   For 24 hours after the procedure, or as directed by your health care provider: Do not flex or bend the affected arm. Do not push or pull heavy objects with the affected arm. Do not drive yourself home from the hospital or clinic. You may drive 24 hours after the procedure unless your health care provider tells you not to. Do not operate machinery or power tools. Do not lift anything that is heavier than 10 lb (4.5 kg), or the limit that you are told, until your health care provider says that it is safe.  For 4 days Ask your health care provider when it is okay to: Return to work  or school. Resume usual physical activities or sports. Resume sexual activity. General instructions If the catheter site starts to bleed, raise your arm and put firm pressure on the site. If the bleeding does not stop, get help right away. This is a medical emergency. If you went home on the same day as your procedure, a responsible adult should be with you for the first 24 hours after you arrive home. Keep all follow-up visits as told by your health care provider. This is important. Contact a health care provider if: You have a fever. You have redness, swelling, or yellow drainage around your insertion site. Get help right away if: You have unusual pain at the brachial site. The catheter insertion area swells very fast. The insertion area is bleeding, and the bleeding does not stop when you hold steady pressure on the area. Your arm or hand becomes pale, cool, tingly, or numb. These symptoms may represent a serious problem that is an emergency. Do not wait to see if the symptoms will go away. Get medical help right away. Call your local emergency services (911 in the [place] Do not drive yourself to the hospital. Summary After the procedure, it is common to have bruising and tenderness at the site. Follow instructions from your health care provider about how to take care of your radial site wound. Check the wound every day for signs of infection. Do not lift anything that is heavier than 10 lb (4.5 kg), or the limit that you  are told, until your health care provider says that it is safe. This information is not intended to replace advice given to you by your health care provider. Make sure you discuss any questions you have with your health care provider. Document Revised: 08/14/2017 Document Reviewed: 08/14/2017 Elsevier Patient Education  2020 [employer].

## 2022-12-26 NOTE — Interval H&P Note (Signed)
History and Physical Interval Note:  12/26/2022 11:16 AM  Ryan Garner  has presented today for surgery, with the diagnosis of AORTIC STENOSIS.  The various methods of treatment have been discussed with the patient and family. After consideration of risks, benefits and other options for treatment, the patient has consented to  Procedure(s): TRANSESOPHAGEAL ECHOCARDIOGRAM (N/A) as a surgical intervention.  The patient's history has been reviewed, patient examined, no change in status, stable for surgery.  I have reviewed the patient's chart and labs.  Questions were answered to the patient's satisfaction.     [name] [employer]

## 2022-12-26 NOTE — Interval H&P Note (Signed)
History and Physical Interval Note:  12/26/2022 1:59 PM  Ryan Garner  has presented today for surgery, with the diagnosis of congestive heart failure, aortic stenosis.  The various methods of treatment have been discussed with the patient and family. After consideration of risks, benefits and other options for treatment, the patient has consented to  Procedure(s): RIGHT HEART CATH (N/A) as a surgical intervention.  The patient's history has been reviewed, patient examined, no change in status, stable for surgery.  I have reviewed the patient's chart and labs.  Questions were answered to the patient's satisfaction.     [name] [employer]

## 2022-12-26 NOTE — Transfer of Care (Signed)
Immediate Anesthesia Transfer of Care Note  Patient: Ryan Garner  Procedure(s) Performed: TRANSESOPHAGEAL ECHOCARDIOGRAM  Patient Location: PACU and Cath Lab  Anesthesia Type:MAC  Level of Consciousness: awake  Airway & Oxygen Therapy: Patient Spontanous Breathing and Patient connected to nasal cannula oxygen  Post-op Assessment: Report given to RN and Post -op Vital signs reviewed and stable  Post vital signs: Reviewed and stable  Last Vitals:  Vitals Value Taken Time  BP    Temp    Pulse    Resp    SpO2      Last Pain:  Vitals:   12/26/22 1037  TempSrc:   PainSc: 0-No pain         Complications: No notable events documented.

## 2022-12-27 ENCOUNTER — Encounter (HOSPITAL_COMMUNITY): Payer: Self-pay | Admitting: Cardiology

## 2022-12-27 NOTE — Anesthesia Postprocedure Evaluation (Signed)
Anesthesia Post Note  Patient: Ryan Garner  Procedure(s) Performed: TRANSESOPHAGEAL ECHOCARDIOGRAM     Anesthesia Type: MAC Anesthetic complications: no   No notable events documented.  Last Vitals:  Vitals:   12/26/22 1424 12/26/22 1454  BP: (!) 97/50 92/75  Pulse: 71 86  Resp: 17   Temp:    SpO2: 98% (!) 82%    Last Pain:  Vitals:   12/26/22 1454  TempSrc:   PainSc: 0-No pain                 Lewie Loron

## 2023-01-09 ENCOUNTER — Ambulatory Visit: Payer: Medicare Other | Admitting: Cardiovascular Disease

## 2023-01-09 NOTE — Progress Notes (Deleted)
Cardiology Office Note:    Date:  01/09/2023   ID:  Ryan Garner, DOB 12/21/1945, MRN [id number]  PCP:  Garner, Ryan Radon, MD   [facility] Providers Cardiologist:  Ryan Pates, MD     Referring MD: Garner, Ryan Radon, MD   No chief complaint on file.   History of Present Illness:    Ryan Garner is a 77 y.o. male referred by Ryan Garner for evaluation of aortic stenosis.  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 Excell Seltzer and Ryan. Lynnette Caffey   Sleep apnea    STEMI (ST elevation myocardial infarction) [facility])     Past Surgical History:  Procedure Laterality Date   APPENDECTOMY     BUBBLE STUDY  10/02/2022   Procedure: BUBBLE STUDY;  Surgeon: Laurey Morale, MD;  Location: [facility] ENDOSCOPY;  Service: Cardiovascular;;   CHOLECYSTECTOMY N/A 03/02/2020   Procedure: LAPAROSCOPIC CHOLECYSTECTOMY;  Surgeon: Franky Macho, MD;  Location: [facility] ORS;  Service: General;  Laterality: N/A;   CORONARY STENT INTERVENTION N/A 04/30/2020   Procedure: CORONARY STENT INTERVENTION;  Surgeon: Swaziland, Peter M, MD;  Location: [facility] INVASIVE CV LAB;  Service: Cardiovascular;  Laterality: N/A;   CORONARY/GRAFT ACUTE MI REVASCULARIZATION N/A 04/30/2020   Procedure: Coronary/Graft Acute MI Revascularization;  Surgeon: Swaziland, Peter M, MD;  Location: [facility] INVASIVE CV LAB;  Service: Cardiovascular;  Laterality: N/A;   Eyelid Surgery Bilateral    HERNIA REPAIR     I & D EXTREMITY Left 04/06/2022   Procedure: LEFT LEG DEBRIDEMENT;  Surgeon: Nadara Mustard, MD;  Location: [facility] OR;  Service: Orthopedics;  Laterality: Left;   I &  D EXTREMITY Left 04/04/2022   Procedure: LEFT LEG DEBRIDEMENT;  Surgeon: Nadara Mustard, MD;  Location: [facility] OR;  Service: Orthopedics;  Laterality: Left;   knee tendons repair     LEFT HEART CATH AND CORONARY ANGIOGRAPHY N/A 04/30/2020   Procedure: LEFT HEART CATH AND CORONARY ANGIOGRAPHY;  Surgeon: Swaziland, Peter M, MD;  Location: [facility] INVASIVE CV LAB;  Service: Cardiovascular;  Laterality: N/A;   MITRAL VALVE REPAIR N/A 10/10/2022   Procedure: MITRAL VALVE REPAIR;  Surgeon: Tonny Bollman, MD;  Location: [facility] INVASIVE CV LAB;  Service: Cardiovascular;  Laterality: N/A;   RIGHT HEART CATH N/A 10/03/2022   Procedure: RIGHT HEART CATH;  Surgeon: Laurey Morale, MD;  Location: [facility] INVASIVE CV LAB;  Service: Cardiovascular;  Laterality: N/A;   RIGHT HEART CATH N/A 12/26/2022   Procedure: RIGHT HEART CATH;  Surgeon: Laurey Morale, MD;  Location: [facility] INVASIVE CV LAB;  Service: Cardiovascular;  Laterality: N/A;   ROTATOR CUFF REPAIR Bilateral    TEE WITHOUT CARDIOVERSION N/A 10/02/2022   Procedure: TRANSESOPHAGEAL ECHOCARDIOGRAM (TEE);  Surgeon: Laurey Morale, MD;  Location: [facility] ENDOSCOPY;  Service: Cardiovascular;  Laterality: N/A;   TEE WITHOUT CARDIOVERSION N/A 10/10/2022   Procedure: TRANSESOPHAGEAL ECHOCARDIOGRAM;  Surgeon: Tonny Bollman, MD;  Location: [facility] INVASIVE CV LAB;  Service: Cardiovascular;  Laterality: N/A;   TEE WITHOUT CARDIOVERSION N/A 12/26/2022   Procedure: TRANSESOPHAGEAL  ECHOCARDIOGRAM;  Surgeon: Laurey Morale, MD;  Location: [facility] INVASIVE CV LAB;  Service: Cardiovascular;  Laterality: N/A;   TONSILLECTOMY      Current Medications: No outpatient medications have been marked as taking for the 01/09/23 encounter (Appointment) with Tonny Bollman, MD.     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, environmental  Tobacco Use   Smoking status: Former    Types: Cigarettes    Quit date: 04/02/1959    Years since quitting: 63.8   Smokeless tobacco: Never  Vaping Use   Vaping Use: Never used  Substance and Sexual Activity   Alcohol use: No   Drug use: No   Sexual activity: Yes  Other Topics Concern   Not on file  Social History Narrative   Not on file   Social Determinants of Health   Financial Resource Strain: Low Risk  (10/19/2022)   Overall Financial Resource Strain (CARDIA)    Difficulty of Paying Living Expenses: Not hard at all  Food Insecurity: No Food Insecurity (10/19/2022)   Hunger Vital Sign    Worried About Running Out of Food in the Last Year: Never true    Ran Out of Food in the Last Year: Never true  Transportation Needs: No Transportation Needs (10/19/2022)   PRAPARE - Administrator, Civil Service (Medical): No    Lack of Transportation (Non-Medical): No  Physical Activity: Inactive (10/14/2022)   Exercise Vital Sign    Days of Exercise per Week: 0 days    Minutes of Exercise per Session: 30 min  Stress: No Stress Concern Present (10/19/2022)   [name]    Feeling of Stress : Not at all  Social Connections: Moderately Integrated (10/14/2022)   Social Connection and Isolation Panel [NHANES]    Frequency of Communication with Friends and Family: More than three times a week    Frequency of Social Gatherings with Friends and Family: Once a week    Attends Religious Services: More than 4 times per year    Active Member of [employer] or Organizations: Yes    Attends Banker Meetings: More than 4 times per year    Marital Status: Widowed     Family History: The patient's ***family history includes Arthritis in his  father; Cancer in his mother; Coronary artery disease in his mother; Diabetes in his brother and maternal grandmother; Heart attack in his brother.  ROS:   Please see the history of present illness.    *** All other systems reviewed and are negative.  EKGs/Labs/Other Studies Reviewed:    The following studies were reviewed today: Cardiac Studies & Procedures   CARDIAC CATHETERIZATION  CARDIAC CATHETERIZATION 12/26/2022  Narrative 1. Mildly elevated PCWP and RA pressure. 2. Severe mixed pulmonary hypertension, suspect pulmonary arterial hypertension in setting of long-standing severe MR with vascular remodeling as well as pulmonary venous hypertension and high output PH.  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   CARDIAC CATHETERIZATION  CARDIAC CATHETERIZATION 10/03/2022  Narrative  1. Right and left heart filling pressures remain elevated.  Surprisingly, V-waves in PCWP tracing not prominent but LA is very large. 2. 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 3. Preserved cardiac output. 4. RV function reasonable with PAPi 2.9.  Continue aggressive diuresis. Timing of Mitraclip to be determined.     ECHOCARDIOGRAM  ECHOCARDIOGRAM COMPLETE 11/16/2022  Narrative ECHOCARDIOGRAM REPORT    Patient Name:   ATTILIO LANTHIER Date of Exam: 11/16/2022 Medical Rec #:  [id number]      Height:       68.0 in Accession #:    [id number]     Weight:       221.0 lb Date of Birth:  09/06/1945       BSA:          2.132 m Patient Age:    76 years       BP:           125/79 mmHg Patient Gender: M              HR:           83 bpm. Exam Location:  [address]  Procedure: 2D Echo, Cardiac Doppler and Color Doppler  Indications:    Z98.890, Z95.818 S/P mitral valve clip implantation  History:        Patient has prior history of Echocardiogram examinations, most recent 10/11/2022. CHF, CAD and  Previous Myocardial Infarction, COPD, Arrythmias:Atrial Fibrillation; Risk Factors:Hypertension, Diabetes and Dyslipidemia. Severe MR. Ascending aortic aneurysm. Bradycardia. Aortic stenosis. Morbid obesity. Secondary pulmonary hypertension. Transient hypotension.  Sonographer:    Cathie Beams RCS Referring Phys: Deri Fuelling MCDANIEL  IMPRESSIONS   1. S/P mitraclip x 2 (A2-P2, A3-P3) with mild to moderate MR and mean gradient 6 mmHg (HR 84). Calcified aortic valve with severe AS (mean gradient 21 mmHg; AVA 0.8 cm2; DI 0.25). 2. Left ventricular ejection fraction, by estimation, is 50 to 55%. The left ventricle has low normal function. The left ventricle has no regional wall motion abnormalities. The left ventricular internal cavity size was mildly dilated. There is mild left ventricular hypertrophy. Left ventricular diastolic function could not be evaluated. There is the interventricular septum is flattened in systole and diastole, consistent with right ventricular pressure and volume overload. 3. Right ventricular systolic function is moderately reduced. The right ventricular size is moderately enlarged. 4. Left atrial size was severely dilated. 5. Right atrial size was severely dilated. 6. The mitral valve is normal in structure. Mild to moderate mitral valve regurgitation. No evidence of mitral stenosis. Procedure Date: 10/10/22. 7. The aortic valve is calcified. Aortic valve regurgitation is not visualized. Severe aortic valve stenosis. 8. The inferior vena cava is dilated in size with >50% respiratory variability, suggesting right atrial pressure of 8 mmHg. 9. 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  Comparison(s): No significant change from prior study.  FINDINGS Left Ventricle: Left ventricular ejection fraction, by estimation, is 50 to 55%. The left ventricle has low normal function. The left ventricle has no  regional wall motion abnormalities. The left ventricular internal cavity size was mildly dilated. There is mild left ventricular hypertrophy. The interventricular septum is flattened in systole and diastole, consistent with right ventricular pressure and volume overload. Left ventricular diastolic function could not be evaluated due to atrial fibrillation. Left ventricular diastolic function could not be evaluated.  Right Ventricle: The right ventricular size is moderately enlarged. Right  ventricular systolic function is moderately reduced.  Left Atrium: Left atrial size was severely dilated.  Right Atrium: Right atrial size was severely dilated.  Pericardium: Trivial pericardial effusion is present.  Mitral Valve: The mitral valve is normal in structure. Mild to moderate mitral valve regurgitation. No evidence of mitral valve stenosis. MV peak gradient, 17.0 mmHg. The mean mitral valve gradient is 5.8 mmHg.  Tricuspid Valve: The tricuspid valve is normal in structure. Tricuspid valve regurgitation is mild . No evidence of tricuspid stenosis.  Aortic Valve: The aortic valve is calcified. Aortic valve regurgitation is not visualized. Severe aortic stenosis is present. Aortic valve mean gradient measures 20.7 mmHg. Aortic valve peak gradient measures 33.6 mmHg. Aortic valve area, by VTI measures 0.80 cm.  Pulmonic Valve: The pulmonic valve was normal in structure. Pulmonic valve regurgitation is not visualized. No evidence of pulmonic stenosis.  Aorta: The aortic root is normal in size and structure.  Venous: The inferior vena cava is dilated in size with greater than 50% respiratory variability, suggesting right atrial pressure of 8 mmHg.  IAS/Shunts: No atrial level shunt detected by color flow Doppler. 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  Additional Comments: S/P mitraclip x 2 (A2-P2, A3-P3) with mild to  moderate MR and mean gradient 6 mmHg (HR 84). Calcified aortic valve with severe AS (mean gradient 21 mmHg; AVA 0.8 cm2; DI 0.25).   LEFT VENTRICLE PLAX 2D LVIDd:         5.40 cm LVIDs:         4.20 cm LV PW:         1.30 cm LV IVS:        1.20 cm LVOT diam:     2.00 cm LV SV:         51 LV SV Index:   24 LVOT Area:     3.14 cm   RIGHT VENTRICLE RV Basal diam:  4.40 cm TAPSE (M-mode): 1.5 cm RVSP:           48.7 mmHg  LEFT ATRIUM           Index        RIGHT ATRIUM           Index LA diam:      6.10 cm 2.86 cm/m   RA Pressure: 3.00 mmHg LA Vol (A4C): 81.4 ml 38.17 ml/m  RA Area:     30.20 cm RA Volume:   108.00 ml 50.65 ml/m AORTIC VALVE AV Area (Vmax):    0.84 cm AV Area (Vmean):   0.77 cm AV Area (VTI):     0.80 cm AV Vmax:           289.67 cm/s AV Vmean:          217.000 cm/s AV VTI:            0.639 m AV Peak Grad:      33.6 mmHg AV Mean Grad:      20.7 mmHg LVOT Vmax:         77.33 cm/s LVOT Vmean:        53.133 cm/s LVOT VTI:          0.162 m LVOT/AV VTI ratio: 0.25  AORTA Ao Root diam: 3.70 cm Ao Asc diam:  3.70 cm  MITRAL VALVE               TRICUSPID VALVE MV Area (PHT): 3.67 cm  TR Peak grad:   45.7 mmHg MV Area VTI:   1.04 cm    TR Vmax:        338.00 cm/s MV Peak grad:  17.0 mmHg   Estimated RAP:  3.00 mmHg MV Mean grad:  5.8 mmHg    RVSP:           48.7 mmHg MV Vmax:       2.06 m/s MV Vmean:      104.5 cm/s  SHUNTS MR Peak grad: 78.1 mmHg    Systemic VTI:  0.16 m MR Mean grad: 53.5 mmHg    Systemic Diam: 2.00 cm MR Vmax:      441.75 cm/s MR Vmean:     349.0 cm/s  Olga Millers MD Electronically signed by Olga Millers MD Signature Date/Time: 11/16/2022/12:32:13 PM    Final   TEE  ECHO TEE 10/10/2022  Narrative TRANSESOPHOGEAL ECHO REPORT    Patient Name:   [name] Date of Exam: 10/10/2022 Medical Rec #:  [id number]      Height:       68.0 in Accession #:    [id number]     Weight:       218.7 lb Date of Birth:   1946-04-13       BSA:          2.123 m Patient Age:    76 years       BP:           122/63 mmHg Patient Gender: M              HR:           102 bpm. Exam Location:  Inpatient  Procedure: Transesophageal Echo, Cardiac Doppler, Color Doppler and 3D Echo  Indications:     I34.0 Nonrheumatic mitral (valve) insufficiency  History:         Patient has prior history of Echocardiogram examinations. CHF, Abnormal ECG, Aortic Valve Disease and Mitral Valve Disease, Arrythmias:Atrial Fibrillation and Bradycardia; Risk Factors:Hypertension and Dyslipidemia. Aortic stenosis. Severe mitral regurgitation.  Sonographer:     Sheralyn Boatman RDCS Referring Phys:  [id number] [name] Diagnosing Phys: Thurmon Fair MD  PROCEDURE: After discussion of the risks and benefits of a TEE, an informed consent was obtained from the patient. The transesophogeal probe was passed without difficulty through the esophogus of the patient. Imaged were obtained with the patient in a supine position. Sedation performed by different physician. The patient was monitored while under deep sedation. Anesthestetic sedation was provided intravenously by Anesthesiology: 110mg  of Propofol. The patient's vital signs; including heart rate, blood pressure, and oxygen saturation; remained stable throughout the procedure. The patient developed no complications during the procedure. Clip procedure using two devices.   TEE, including live and postprocessed 3D imaging was used to guide transseptal puncture, device deployment, asess the final result and evaluate for complications.  PREOPERATIVE FINDINGS  Left ventricular systolic function is borderline decreased, EF 50-55%. There is severe mitral insufficiency. There is a broad-based, eccentric and posteriorly directed jet originating from malcoaptation of A2 and P2 (middle scallops), also slightly towards A3-P3 (medial scallops). This is largely due to annular dilation and restricted posterior  leaflet motion. Although there may be a small ruptured chorda to A2, the extent and severity of the regurgitation cannot be explained by this.The anterior lealfet is not flail. There is systolic flow reversal in the right pulmonary veins and systolic flow blunting/absence on the left. By PISA, the ERO area is  0.54 cm sq, regurgitant volume 70 ml, regurgitant fraction 50% (results prone to calculation error due to atrial fibrillation). The MR vena contracta is 6 mm. Baseline mitral valve gradient is 2 mm Hg at 90 bpm. There is mild tricuspid insufficiency and estimated systolic PA pressure of 45 mm Hg at baseline. There is mild aortic stenosis. There is no pericardial effusion.  POSTOPERATIVE FINDINGS  Multiple attempts at Pascal TEER device deployment were made. At the end of the procedure, 2 Pascal ACE clips were deployed in the A2-P2 and A3-P3 locations. Each device was well attached to both mitral leaflets, with solid tissue bridge. Left ventricular systolic function appears unchanged, with EF 50-55%. There is mild mitral stenosis with a mean gradient of 6 mm Hg at a mean heart rate of 111 bpm. There are 2 residual jets of mitral insufficiency, originating medial to the clips and directed towards the septum and originating between the clips and directed laterally. Each jet is mild in severity. The cumulative severity of mitral insufficiency is at most mild-to-moderate. There is forward systolic pulmonary vein flow in both right and left veins. There is a small iatrogenic secundum atrial septal defect with almost entirely left-to-right shunt. Estimated systolic PA pressure 46 mm Hg. There is no pericardial effusion.  IMPRESSIONS   1. Left ventricular ejection fraction, by estimation, is 50 to 55%. The left ventricle has low normal function. The left ventricle has no regional wall motion abnormalities. 2. Right ventricular systolic function is severely reduced. The right ventricular size is  moderately enlarged. 3. Left atrial size was severely dilated. No left atrial/left atrial appendage thrombus was detected. 4. Right atrial size was moderately dilated. 5. Cannot exclude a tiny ruptured chord attached to the middle portion of the anterior leaflet, but the major cause of mitral insufficiency appears to be annular dilation and malcoaptation ("atrial functional MR"). The mitral valve is abnormal. Severe mitral valve regurgitation. 6. The aortic valve is tricuspid. There is moderate calcification of the aortic valve. There is moderate thickening of the aortic valve. Aortic valve regurgitation is not visualized. Mild aortic valve stenosis. Aortic valve mean gradient measures 10.0 mmHg. Aortic valve Vmax measures 2.02 m/s.  FINDINGS Left Ventricle: Left ventricular ejection fraction, by estimation, is 50 to 55%. The left ventricle has low normal function. The left ventricle has no regional wall motion abnormalities. The left ventricular internal cavity size was normal in size.  Right Ventricle: The right ventricular size is moderately enlarged. No increase in right ventricular wall thickness. Right ventricular systolic function is severely reduced.  Left Atrium: Left atrial size was severely dilated. No left atrial/left atrial appendage thrombus was detected.  Right Atrium: Right atrial size was moderately dilated. Prominent Eustachian valve.  Pericardium: There is no evidence of pericardial effusion.  Mitral Valve: Cannot exclude a tiny ruptured chord attached to the middle portion of the anterior leaflet, but the major cause of mitral insufficiency appears to be annular dilation and malcoaptation ("atrial functional MR"). The mitral valve is abnormal. Severe mitral valve regurgitation, with eccentric posteriorly directed jet. MV peak gradient, 8.2 mmHg. The mean mitral valve gradient is 4.2 mmHg.  Tricuspid Valve: The tricuspid valve is normal in structure. Tricuspid valve  regurgitation is mild.  Aortic Valve: The aortic valve is tricuspid. There is moderate calcification of the aortic valve. There is moderate thickening of the aortic valve. Aortic valve regurgitation is not visualized. Mild aortic stenosis is present. Aortic valve mean gradient measures 10.0 mmHg. Aortic valve peak gradient  measures 16.2 mmHg. Aortic valve area, by VTI measures 2.80 cm.  Pulmonic Valve: The pulmonic valve was normal in structure. Pulmonic valve regurgitation is trivial.  Aorta: The aortic root, ascending aorta, aortic arch and descending aorta are all structurally normal, with no evidence of dilitation or obstruction.  IAS/Shunts: No atrial level shunt detected by color flow Doppler.  Additional Comments: Spectral Doppler performed.  LEFT VENTRICLE PLAX 2D LVOT diam:     2.55 cm LV SV:         89 LV SV Index:   42 LVOT Area:     5.11 cm   AORTIC VALVE AV Area (Vmax):    2.58 cm AV Area (Vmean):   2.26 cm AV Area (VTI):     2.80 cm AV Vmax:           201.50 cm/s AV Vmean:          146.000 cm/s AV VTI:            0.317 m AV Peak Grad:      16.2 mmHg AV Mean Grad:      10.0 mmHg LVOT Vmax:         101.85 cm/s LVOT Vmean:        64.650 cm/s LVOT VTI:          0.174 m LVOT/AV VTI ratio: 0.55  MITRAL VALVE MV Area VTI:  4.32 cm        SHUNTS MV Peak grad: 8.2 mmHg        Systemic VTI:  0.17 m MV Mean grad: 4.2 mmHg        Systemic Diam: 2.55 cm MV Vmax:      1.43 m/s MV Vmean:     92.8 cm/s MR Peak grad:    80.6 mmHg MR Mean grad:    45.0 mmHg MR Vmax:         449.00 cm/s MR Vmean:        308.0 cm/s MR PISA:         6.93 cm MR PISA Eff ROA: 54 mm MR PISA Radius:  1.05 cm  Mihai Croitoru MD Electronically signed by Thurmon Fair MD Signature Date/Time: 10/10/2022/7:59:45 PM    Final             EKG:  EKG is *** ordered today.  The ekg ordered today demonstrates:  EKG Interpretation  Date/Time:    Ventricular Rate:    PR Interval:     QRS Duration:   QT Interval:    QTC Calculation:   R Axis:     Text Interpretation:           Recent Labs: 09/11/2022: NT-Pro BNP 8,444 09/20/2022: ALT 13 10/11/2022: Magnesium 1.9 12/14/2022: B Natriuretic Peptide 363.3; Platelets 241 12/26/2022: BUN 80; Creatinine, Ser 2.27; Hemoglobin 11.2; Hemoglobin 10.9; Potassium 3.9; Potassium 3.9; Sodium 135; Sodium 135  Recent Lipid Panel    Component Value Date/Time   CHOL 100 12/14/2022 1151   CHOL 92 (L) 12/15/2021 1259   TRIG 39 12/14/2022 1151   TRIG 74 08/27/2014 0826   HDL 50 12/14/2022 1151   HDL 35 (L) 12/15/2021 1259   HDL 56 08/27/2014 0826   CHOLHDL 2.0 12/14/2022 1151   VLDL 8 12/14/2022 1151   LDLCALC 42 12/14/2022 1151   LDLCALC 43 12/15/2021 1259   LDLCALC 78 11/13/2013 0911     Risk Assessment/Calculations:   {Does this patient have ATRIAL FIBRILLATION?:[phone]}  No BP recorded.  {Refresh Note OR  Click here to enter BP  :1}***         Physical Exam:    VS:  There were no vitals taken for this visit.    Wt Readings from Last 3 Encounters:  12/26/22 222 lb (100.7 kg)  11/16/22 237 lb 9.6 oz (107.8 kg)  10/23/22 221 lb (100.2 kg)     GEN: *** Well nourished, well developed in no acute distress HEENT: Normal NECK: No JVD; No carotid bruits LYMPHATICS: No lymphadenopathy CARDIAC: ***RRR, no murmurs, rubs, gallops RESPIRATORY:  Clear to auscultation without rales, wheezing or rhonchi  ABDOMEN: Soft, non-tender, non-distended MUSCULOSKELETAL:  No edema; No deformity  SKIN: Warm and dry NEUROLOGIC:  Alert and oriented x 3 PSYCHIATRIC:  Normal affect   ASSESSMENT:    1. Nonrheumatic aortic (valve) stenosis    PLAN:    In order of problems listed above:  ***      {Are you ordering a CV Procedure (e.g. stress test, cath, DCCV, TEE, etc)?   Press F2        :[id number]}    Medication Adjustments/Labs and Tests Ordered: Current medicines are reviewed at length with the patient today.  Concerns  regarding medicines are outlined above.  No orders of the defined types were placed in this encounter.  No orders of the defined types were placed in this encounter.   There are no Patient Instructions on file for this visit.   Signed, Tonny Bollman, MD  01/09/2023 7:29 AM    [facility]

## 2023-01-14 ENCOUNTER — Ambulatory Visit (HOSPITAL_COMMUNITY): Payer: Medicare Other

## 2023-01-14 LAB — ECHO TEE
AV Area VTI: 0.9 cm2
AV Peak grad: 41.3 mmHg

## 2023-01-14 NOTE — H&P (View-Only) (Signed)
 Patient ID: Ryan Garner MRN: [id number] DOB/AGE: Oct 26, 1945 77 y.o.  Primary Care Physician:[name], [name], MD Primary Cardiologist: [name], MD AHF Cardiologist: Marca Ancona, MD  FOCUSED CARDIOVASCULAR PROBLEM LIST:   1.  Chronic diastolic heart failure 2.  Coronary artery disease status post stent in 2021  3.  Severe functional mitral regurgitation status post mitral transcatheter edge-to-edge repair with Edwards PASCAL ACE (x2) March 2023 complicated by rectus sheath hematoma 4.  Aortic stenosis 5.  Hypertension 6.  Hyperlipidemia 7.  CKD stage IV  HISTORY OF PRESENT ILLNESS: The patient is a 77 y.o. male with the indicated medical history here for recommendations regarding aortic valvular disease.  Patient has a complicated medical history followed by Dr. Shirlee Latch for some time.  He was admitted to the hospital with acute on chronic diastolic heart failure requiring dobutamine for RV support.  He was diuresed close to 90 pounds.  He underwent mitral transcatheter edge-to-edge repair for severe mitral regurgitation which thought to be functional in nature.  He was seen in follow-up about a month ago.  He was noted to have an increase in his weight of about 17 pounds.  Increasing peripheral edema.  He however was able to mow his grass and walk on flat ground though he did note dyspnea when going up hills or stairs.  An echocardiogram in April was suspicious for significant aortic valvular disease.  He underwent right heart catheterization which showed a mean RA pressure of 9 mmHg and mean wedge pressure of 16 mmHg with V waves to 25 mmHg.  Past Medical History:  Diagnosis Date   A-fib [facility])    Acute on chronic diastolic CHF (congestive heart failure) (HCC) 07/21/2020   Coronary artery disease    Diabetes mellitus without complication (HCC)    Dyspnea    Dysrhythmia    A-fib   Hyperlipidemia    Hypertension    Metabolic syndrome    Prediabetes    S/P mitral valve clip  implantation 10/10/2022   2 PASCAL ACE clips positioned on medial aspect A2/P2 placed by Dr. Excell Seltzer and Dr. Lynnette Caffey   Sleep apnea    STEMI (ST elevation myocardial infarction) [facility])     Past Surgical History:  Procedure Laterality Date   APPENDECTOMY     BUBBLE STUDY  10/02/2022   Procedure: BUBBLE STUDY;  Surgeon: Laurey Morale, MD;  Location: [facility] ENDOSCOPY;  Service: Cardiovascular;;   CHOLECYSTECTOMY N/A 03/02/2020   Procedure: LAPAROSCOPIC CHOLECYSTECTOMY;  Surgeon: Franky Macho, MD;  Location: [facility] ORS;  Service: General;  Laterality: N/A;   CORONARY STENT INTERVENTION N/A 04/30/2020   Procedure: CORONARY STENT INTERVENTION;  Surgeon: Swaziland, Peter M, MD;  Location: [facility] INVASIVE CV LAB;  Service: Cardiovascular;  Laterality: N/A;   CORONARY/GRAFT ACUTE MI REVASCULARIZATION N/A 04/30/2020   Procedure: Coronary/Graft Acute MI Revascularization;  Surgeon: Swaziland, Peter M, MD;  Location: [facility] INVASIVE CV LAB;  Service: Cardiovascular;  Laterality: N/A;   Eyelid Surgery Bilateral    HERNIA REPAIR     I & D EXTREMITY Left 04/06/2022   Procedure: LEFT LEG DEBRIDEMENT;  Surgeon: Nadara Mustard, MD;  Location: [facility] OR;  Service: Orthopedics;  Laterality: Left;   I & D EXTREMITY Left 04/04/2022   Procedure: LEFT LEG DEBRIDEMENT;  Surgeon: Nadara Mustard, MD;  Location: [facility] OR;  Service: Orthopedics;  Laterality: Left;   knee tendons repair     LEFT HEART CATH AND CORONARY ANGIOGRAPHY N/A 04/30/2020   Procedure: LEFT HEART CATH AND  CORONARY ANGIOGRAPHY;  Surgeon: Swaziland, Peter M, MD;  Location: [facility] INVASIVE CV LAB;  Service: Cardiovascular;  Laterality: N/A;   MITRAL VALVE REPAIR N/A 10/10/2022   Procedure: MITRAL VALVE REPAIR;  Surgeon: Tonny Bollman, MD;  Location: [facility] INVASIVE CV LAB;  Service: Cardiovascular;  Laterality: N/A;   RIGHT HEART CATH N/A 10/03/2022   Procedure: RIGHT HEART CATH;  Surgeon: Laurey Morale, MD;  Location: [facility] INVASIVE CV LAB;  Service: Cardiovascular;  Laterality: N/A;    RIGHT HEART CATH N/A 12/26/2022   Procedure: RIGHT HEART CATH;  Surgeon: Laurey Morale, MD;  Location: [facility] INVASIVE CV LAB;  Service: Cardiovascular;  Laterality: N/A;   ROTATOR CUFF REPAIR Bilateral    TEE WITHOUT CARDIOVERSION N/A 10/02/2022   Procedure: TRANSESOPHAGEAL ECHOCARDIOGRAM (TEE);  Surgeon: Laurey Morale, MD;  Location: [facility] ENDOSCOPY;  Service: Cardiovascular;  Laterality: N/A;   TEE WITHOUT CARDIOVERSION N/A 10/10/2022   Procedure: TRANSESOPHAGEAL ECHOCARDIOGRAM;  Surgeon: Tonny Bollman, MD;  Location: [facility] INVASIVE CV LAB;  Service: Cardiovascular;  Laterality: N/A;   TEE WITHOUT CARDIOVERSION N/A 12/26/2022   Procedure: TRANSESOPHAGEAL ECHOCARDIOGRAM;  Surgeon: Laurey Morale, MD;  Location: [facility] INVASIVE CV LAB;  Service: Cardiovascular;  Laterality: N/A;   TONSILLECTOMY      Family History  Problem Relation Age of Onset   Cancer Mother        lungs   Coronary artery disease Mother    Diabetes Brother    Heart attack Brother    Diabetes Maternal Grandmother    Arthritis Father     Social History   Socioeconomic History   Marital status: Widowed    Spouse name: Karena Addison   Number of children: 2   Years of education: 20   Highest education level: Bachelor's degree (e.g., BA, AB, BS)  Occupational History   Occupation: Education officer, environmental    Comment: 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, environmental  Tobacco Use   Smoking status: Former    Types: Cigarettes    Quit date: 04/02/1959    Years since quitting: 63.8   Smokeless tobacco: Never  Vaping Use   Vaping Use: Never used  Substance and Sexual Activity   Alcohol use: No   Drug use: No   Sexual activity: Yes  Other Topics Concern   Not on file  Social History Narrative   Not on file   Social Determinants of Health   Financial Resource Strain: Low Risk  (10/19/2022)   Overall Financial Resource Strain (CARDIA)    Difficulty of Paying Living Expenses: Not hard at all  Food Insecurity: No  Food Insecurity (10/19/2022)   Hunger Vital Sign    Worried About Running Out of Food in the Last Year: Never true    Ran Out of Food in the Last Year: Never true  Transportation Needs: No Transportation Needs (10/19/2022)   PRAPARE - Administrator, Civil Service (Medical): No    Lack of Transportation (Non-Medical): No  Physical Activity: Inactive (10/14/2022)   Exercise Vital Sign    Days of Exercise per Week: 0 days    Minutes of Exercise per Session: 30 min  Stress: No Stress Concern Present (10/19/2022)   [name]    Feeling of Stress : Not at all  Social Connections: Moderately Integrated (10/14/2022)   Social Connection and Isolation Panel [NHANES]    Frequency of Communication with Friends and Family: More than three  times a week    Frequency of Social Gatherings with Friends and Family: Once a week    Attends Religious Services: More than 4 times per year    Active Member of [employer] or Organizations: Yes    Attends Banker Meetings: More than 4 times per year    Marital Status: Widowed  Intimate Partner Violence: Not At Risk (10/07/2022)   Humiliation, Afraid, Rape, and Kick questionnaire    Fear of Current or Ex-Partner: No    Emotionally Abused: No    Physically Abused: No    Sexually Abused: No     Prior to Admission medications   Medication Sig Start Date End Date Taking? Authorizing Provider  albuterol (VENTOLIN HFA) 108 (90 Base) MCG/ACT inhaler Inhale 2 puffs into the lungs every 6 (six) hours as needed for wheezing or shortness of breath. 07/30/22   [name], [name], MD  apixaban (ELIQUIS) 5 MG TABS tablet Take 1 tablet (5 mg total) by mouth 2 (two) times daily. 09/07/21   Pricilla Riffle, MD  ascorbic acid (VITAMIN C) 1000 MG tablet Take 1 tablet (1,000 mg total) by mouth daily. 04/14/22   Adonis Huguenin, NP  atorvastatin (LIPITOR) 80 MG tablet Take 1 tablet by mouth once daily  11/08/22   Pricilla Riffle, MD  dapagliflozin propanediol (FARXIGA) 10 MG TABS tablet Take 10 mg by mouth in the morning.    [provider]  loratadine (ALLERGY RELIEF, LORATADINE,) 10 MG tablet Take 10 mg by mouth daily as needed for allergies.    [provider]  metolazone (ZAROXOLYN) 2.5 MG tablet Take 1 tablet (2.5 mg total) by mouth once a week. Every saturday 12/14/22   Laurey Morale, MD  midodrine (PROAMATINE) 2.5 MG tablet Take 1 tablet (2.5 mg total) by mouth 3 (three) times daily with meals. 10/11/22   Clegg, Amy D, NP  neomycin-bacitracin-polymyxin [phone] OINT Apply 1 Application topically 3 (three) times daily as needed (wounds/bruises).    [provider]  nitroGLYCERIN (NITROSTAT) 0.4 MG SL tablet Place 1 tablet (0.4 mg total) under the tongue every 5 (five) minutes x 3 doses as needed for chest pain. 04/26/22   Bhagat, Sharrell Ku, PA  potassium chloride SA (KLOR-CON M20) 20 MEQ tablet Take 1 tablet (20 mEq total) by mouth daily. 12/21/22   Laurey Morale, MD  spironolactone (ALDACTONE) 25 MG tablet Take 1 tablet (25 mg total) by mouth daily. 12/14/22   Laurey Morale, MD  Tiotropium Bromide Monohydrate (SPIRIVA RESPIMAT) 1.25 MCG/ACT AERS Inhale 2 puffs into the lungs daily. Patient taking differently: Inhale 2 puffs into the lungs daily as needed (respiratory issues.). 03/05/22   Shon Hale, MD  torsemide (DEMADEX) 20 MG tablet Take 3 tablets (60 mg total) by mouth 2 (two) times daily. 12/21/22   Laurey Morale, MD    Allergies  Allergen Reactions   Flecainide Other (See Comments)    dizziness, shortness of breath, blurred vision   Metoprolol Tartrate Itching   Rofecoxib Rash and Other (See Comments)    (Vioxx)    REVIEW OF SYSTEMS:  General: no fevers/chills/night sweats Eyes: no blurry vision, diplopia, or amaurosis ENT: no sore throat or hearing loss Resp: no cough, wheezing, or hemoptysis CV: no edema or palpitations GI: no  abdominal pain, nausea, vomiting, diarrhea, or constipation GU: no dysuria, frequency, or hematuria Skin: no rash Neuro: no headache, numbness, tingling, or weakness of extremities Musculoskeletal: no joint pain or swelling Heme: no bleeding,  DVT, or easy bruising Endo: no polydipsia or polyuria  There were no vitals taken for this visit.  PHYSICAL EXAM: GEN:  AO x 3 in no acute distress HEENT: normal Dentition: Normal*** Neck: JVP normal. +2***carotid upstrokes without bruits. No thyromegaly. Lungs: equal expansion, clear bilaterally CV: Apex is discrete and nondisplaced, RRR without murmur or gallop*** Abd: soft, non-tender, non-distended; no bruit; positive bowel sounds Ext: no edema, ecchymoses, or cyanosis Vascular: 2+ femoral pulses, 2+ radial pulses       Skin: warm and dry without rash Neuro: CN II-XII grossly intact; motor and sensory grossly intact    DATA AND STUDIES:  EKG: EKG from May 2024 demonstrates atrial fibrillation with incomplete right bundle branch block and occasional PVCs  2D ECHO:  TEE 2024 1. Left ventricular ejection fraction, by estimation, is 45 to 50%. The  left ventricle has mildly decreased function. The left ventricle  demonstrates global hypokinesis. There is mild left ventricular  hypertrophy.   2. D-shaped interventricular septum suggests a degree of RV  pressure/volume overload. Peak RV-RA gradient 59 mmHg. Right ventricular  systolic function is moderately reduced. The right ventricular size is  moderately enlarged.   3. Left atrial size was severely dilated. No left atrial/left atrial  appendage thrombus was detected.   4. Right atrial size was severely dilated.   5. 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   6. S/p MV repair via mTEER with 2 Pascal devices that are well-seated.  There is moderate residual mitral regurgitation. There is no systolic flow  reversal on pulmonary vein doppler interrogation. Mild mitral stenosis.  The mean  mitral valve gradient is  5.0 mmHg.   7. The aortic valve is tricuspid. There is severe calcifcation of the  aortic valve. Aortic valve regurgitation is not visualized.   CARDIAC CATH:   RHC June 2024: RA mean 9 RV 76/9 PA 74/24, mean 42 PCWP mean 16  Oxygen saturations: PA 76% AO 95%  Cardiac Output (Fick) 10.1  Cardiac Index (Fick) 4.77    STS RISK CALCULATOR: pending  NHYA CLASS: ***    ASSESSMENT AND PLAN:   Nonrheumatic aortic valve stenosis  Chronic diastolic CHF (congestive heart failure) (HCC)  S/P mitral valve clip implantation  Coronary artery disease involving native coronary artery of native heart without angina pectoris  Chronic atrial fibrillation (HCC)  Hyperlipidemia LDL goal <70  Primary hypertension  CKD (chronic kidney disease) stage 4, GFR 15-29 ml/min (HCC)   I have personally reviewed the patients imaging data as summarized above.  I have reviewed the natural history of aortic stenosis with the patient and family members who are present today. We have discussed the limitations of medical therapy and the poor prognosis associated with symptomatic aortic stenosis. We have also reviewed potential treatment options, including palliative medical therapy, conventional surgical aortic valve replacement, and transcatheter aortic valve replacement. We discussed treatment options in the context of this patient's specific comorbid medical conditions.   All of the patient's questions were answered today. Will make further recommendations based on the results of studies outlined above.   Total time spent with patient today *** minutes. This includes reviewing records, evaluating the patient and coordinating care.   Orbie Pyo, MD  01/14/2023 2:00 PM    [facility] [address] [place], [place], [place]  [address] Phone: [phone]; Fax: [phone]

## 2023-01-14 NOTE — Progress Notes (Unsigned)
Patient ID: Ryan Garner MRN: [id number] DOB/AGE: Oct 26, 1945 77 y.o.  Primary Care Physician:[name], [name], MD Primary Cardiologist: [name], MD AHF Cardiologist: Marca Ancona, MD  FOCUSED CARDIOVASCULAR PROBLEM LIST:   1.  Chronic diastolic heart failure 2.  Coronary artery disease status post stent in 2021  3.  Severe functional mitral regurgitation status post mitral transcatheter edge-to-edge repair with Edwards PASCAL ACE (x2) March 2023 complicated by rectus sheath hematoma 4.  Aortic stenosis 5.  Hypertension 6.  Hyperlipidemia 7.  CKD stage IV  HISTORY OF PRESENT ILLNESS: The patient is a 77 y.o. male with the indicated medical history here for recommendations regarding aortic valvular disease.  Patient has a complicated medical history followed by Dr. Shirlee Latch for some time.  He was admitted to the hospital with acute on chronic diastolic heart failure requiring dobutamine for RV support.  He was diuresed close to 90 pounds.  He underwent mitral transcatheter edge-to-edge repair for severe mitral regurgitation which thought to be functional in nature.  He was seen in follow-up about a month ago.  He was noted to have an increase in his weight of about 17 pounds.  Increasing peripheral edema.  He however was able to mow his grass and walk on flat ground though he did note dyspnea when going up hills or stairs.  An echocardiogram in April was suspicious for significant aortic valvular disease.  He underwent right heart catheterization which showed a mean RA pressure of 9 mmHg and mean wedge pressure of 16 mmHg with V waves to 25 mmHg.  Past Medical History:  Diagnosis Date   A-fib [facility])    Acute on chronic diastolic CHF (congestive heart failure) (HCC) 07/21/2020   Coronary artery disease    Diabetes mellitus without complication (HCC)    Dyspnea    Dysrhythmia    A-fib   Hyperlipidemia    Hypertension    Metabolic syndrome    Prediabetes    S/P mitral valve clip  implantation 10/10/2022   2 PASCAL ACE clips positioned on medial aspect A2/P2 placed by Dr. Excell Seltzer and Dr. Lynnette Caffey   Sleep apnea    STEMI (ST elevation myocardial infarction) [facility])     Past Surgical History:  Procedure Laterality Date   APPENDECTOMY     BUBBLE STUDY  10/02/2022   Procedure: BUBBLE STUDY;  Surgeon: Laurey Morale, MD;  Location: [facility] ENDOSCOPY;  Service: Cardiovascular;;   CHOLECYSTECTOMY N/A 03/02/2020   Procedure: LAPAROSCOPIC CHOLECYSTECTOMY;  Surgeon: Franky Macho, MD;  Location: [facility] ORS;  Service: General;  Laterality: N/A;   CORONARY STENT INTERVENTION N/A 04/30/2020   Procedure: CORONARY STENT INTERVENTION;  Surgeon: Swaziland, Peter M, MD;  Location: [facility] INVASIVE CV LAB;  Service: Cardiovascular;  Laterality: N/A;   CORONARY/GRAFT ACUTE MI REVASCULARIZATION N/A 04/30/2020   Procedure: Coronary/Graft Acute MI Revascularization;  Surgeon: Swaziland, Peter M, MD;  Location: [facility] INVASIVE CV LAB;  Service: Cardiovascular;  Laterality: N/A;   Eyelid Surgery Bilateral    HERNIA REPAIR     I & D EXTREMITY Left 04/06/2022   Procedure: LEFT LEG DEBRIDEMENT;  Surgeon: Nadara Mustard, MD;  Location: [facility] OR;  Service: Orthopedics;  Laterality: Left;   I & D EXTREMITY Left 04/04/2022   Procedure: LEFT LEG DEBRIDEMENT;  Surgeon: Nadara Mustard, MD;  Location: [facility] OR;  Service: Orthopedics;  Laterality: Left;   knee tendons repair     LEFT HEART CATH AND CORONARY ANGIOGRAPHY N/A 04/30/2020   Procedure: LEFT HEART CATH AND  CORONARY ANGIOGRAPHY;  Surgeon: Swaziland, Peter M, MD;  Location: [facility] INVASIVE CV LAB;  Service: Cardiovascular;  Laterality: N/A;   MITRAL VALVE REPAIR N/A 10/10/2022   Procedure: MITRAL VALVE REPAIR;  Surgeon: Tonny Bollman, MD;  Location: [facility] INVASIVE CV LAB;  Service: Cardiovascular;  Laterality: N/A;   RIGHT HEART CATH N/A 10/03/2022   Procedure: RIGHT HEART CATH;  Surgeon: Laurey Morale, MD;  Location: [facility] INVASIVE CV LAB;  Service: Cardiovascular;  Laterality: N/A;    RIGHT HEART CATH N/A 12/26/2022   Procedure: RIGHT HEART CATH;  Surgeon: Laurey Morale, MD;  Location: [facility] INVASIVE CV LAB;  Service: Cardiovascular;  Laterality: N/A;   ROTATOR CUFF REPAIR Bilateral    TEE WITHOUT CARDIOVERSION N/A 10/02/2022   Procedure: TRANSESOPHAGEAL ECHOCARDIOGRAM (TEE);  Surgeon: Laurey Morale, MD;  Location: [facility] ENDOSCOPY;  Service: Cardiovascular;  Laterality: N/A;   TEE WITHOUT CARDIOVERSION N/A 10/10/2022   Procedure: TRANSESOPHAGEAL ECHOCARDIOGRAM;  Surgeon: Tonny Bollman, MD;  Location: [facility] INVASIVE CV LAB;  Service: Cardiovascular;  Laterality: N/A;   TEE WITHOUT CARDIOVERSION N/A 12/26/2022   Procedure: TRANSESOPHAGEAL ECHOCARDIOGRAM;  Surgeon: Laurey Morale, MD;  Location: [facility] INVASIVE CV LAB;  Service: Cardiovascular;  Laterality: N/A;   TONSILLECTOMY      Family History  Problem Relation Age of Onset   Cancer Mother        lungs   Coronary artery disease Mother    Diabetes Brother    Heart attack Brother    Diabetes Maternal Grandmother    Arthritis Father     Social History   Socioeconomic History   Marital status: Widowed    Spouse name: Karena Addison   Number of children: 2   Years of education: 20   Highest education level: Bachelor's degree (e.g., BA, AB, BS)  Occupational History   Occupation: Education officer, environmental    Comment: 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, environmental  Tobacco Use   Smoking status: Former    Types: Cigarettes    Quit date: 04/02/1959    Years since quitting: 63.8   Smokeless tobacco: Never  Vaping Use   Vaping Use: Never used  Substance and Sexual Activity   Alcohol use: No   Drug use: No   Sexual activity: Yes  Other Topics Concern   Not on file  Social History Narrative   Not on file   Social Determinants of Health   Financial Resource Strain: Low Risk  (10/19/2022)   Overall Financial Resource Strain (CARDIA)    Difficulty of Paying Living Expenses: Not hard at all  Food Insecurity: No  Food Insecurity (10/19/2022)   Hunger Vital Sign    Worried About Running Out of Food in the Last Year: Never true    Ran Out of Food in the Last Year: Never true  Transportation Needs: No Transportation Needs (10/19/2022)   PRAPARE - Administrator, Civil Service (Medical): No    Lack of Transportation (Non-Medical): No  Physical Activity: Inactive (10/14/2022)   Exercise Vital Sign    Days of Exercise per Week: 0 days    Minutes of Exercise per Session: 30 min  Stress: No Stress Concern Present (10/19/2022)   [name]    Feeling of Stress : Not at all  Social Connections: Moderately Integrated (10/14/2022)   Social Connection and Isolation Panel [NHANES]    Frequency of Communication with Friends and Family: More than three  times a week    Frequency of Social Gatherings with Friends and Family: Once a week    Attends Religious Services: More than 4 times per year    Active Member of [employer] or Organizations: Yes    Attends Banker Meetings: More than 4 times per year    Marital Status: Widowed  Intimate Partner Violence: Not At Risk (10/07/2022)   Humiliation, Afraid, Rape, and Kick questionnaire    Fear of Current or Ex-Partner: No    Emotionally Abused: No    Physically Abused: No    Sexually Abused: No     Prior to Admission medications   Medication Sig Start Date End Date Taking? Authorizing Provider  albuterol (VENTOLIN HFA) 108 (90 Base) MCG/ACT inhaler Inhale 2 puffs into the lungs every 6 (six) hours as needed for wheezing or shortness of breath. 07/30/22   [name], [name], MD  apixaban (ELIQUIS) 5 MG TABS tablet Take 1 tablet (5 mg total) by mouth 2 (two) times daily. 09/07/21   Pricilla Riffle, MD  ascorbic acid (VITAMIN C) 1000 MG tablet Take 1 tablet (1,000 mg total) by mouth daily. 04/14/22   Adonis Huguenin, NP  atorvastatin (LIPITOR) 80 MG tablet Take 1 tablet by mouth once daily  11/08/22   Pricilla Riffle, MD  dapagliflozin propanediol (FARXIGA) 10 MG TABS tablet Take 10 mg by mouth in the morning.    [provider]  loratadine (ALLERGY RELIEF, LORATADINE,) 10 MG tablet Take 10 mg by mouth daily as needed for allergies.    [provider]  metolazone (ZAROXOLYN) 2.5 MG tablet Take 1 tablet (2.5 mg total) by mouth once a week. Every saturday 12/14/22   Laurey Morale, MD  midodrine (PROAMATINE) 2.5 MG tablet Take 1 tablet (2.5 mg total) by mouth 3 (three) times daily with meals. 10/11/22   Clegg, Amy D, NP  neomycin-bacitracin-polymyxin [phone] OINT Apply 1 Application topically 3 (three) times daily as needed (wounds/bruises).    [provider]  nitroGLYCERIN (NITROSTAT) 0.4 MG SL tablet Place 1 tablet (0.4 mg total) under the tongue every 5 (five) minutes x 3 doses as needed for chest pain. 04/26/22   Bhagat, Sharrell Ku, PA  potassium chloride SA (KLOR-CON M20) 20 MEQ tablet Take 1 tablet (20 mEq total) by mouth daily. 12/21/22   Laurey Morale, MD  spironolactone (ALDACTONE) 25 MG tablet Take 1 tablet (25 mg total) by mouth daily. 12/14/22   Laurey Morale, MD  Tiotropium Bromide Monohydrate (SPIRIVA RESPIMAT) 1.25 MCG/ACT AERS Inhale 2 puffs into the lungs daily. Patient taking differently: Inhale 2 puffs into the lungs daily as needed (respiratory issues.). 03/05/22   Shon Hale, MD  torsemide (DEMADEX) 20 MG tablet Take 3 tablets (60 mg total) by mouth 2 (two) times daily. 12/21/22   Laurey Morale, MD    Allergies  Allergen Reactions   Flecainide Other (See Comments)    dizziness, shortness of breath, blurred vision   Metoprolol Tartrate Itching   Rofecoxib Rash and Other (See Comments)    (Vioxx)    REVIEW OF SYSTEMS:  General: no fevers/chills/night sweats Eyes: no blurry vision, diplopia, or amaurosis ENT: no sore throat or hearing loss Resp: no cough, wheezing, or hemoptysis CV: no edema or palpitations GI: no  abdominal pain, nausea, vomiting, diarrhea, or constipation GU: no dysuria, frequency, or hematuria Skin: no rash Neuro: no headache, numbness, tingling, or weakness of extremities Musculoskeletal: no joint pain or swelling Heme: no bleeding,  DVT, or easy bruising Endo: no polydipsia or polyuria  There were no vitals taken for this visit.  PHYSICAL EXAM: GEN:  AO x 3 in no acute distress HEENT: normal Dentition: Normal*** Neck: JVP normal. +2***carotid upstrokes without bruits. No thyromegaly. Lungs: equal expansion, clear bilaterally CV: Apex is discrete and nondisplaced, RRR without murmur or gallop*** Abd: soft, non-tender, non-distended; no bruit; positive bowel sounds Ext: no edema, ecchymoses, or cyanosis Vascular: 2+ femoral pulses, 2+ radial pulses       Skin: warm and dry without rash Neuro: CN II-XII grossly intact; motor and sensory grossly intact    DATA AND STUDIES:  EKG: EKG from May 2024 demonstrates atrial fibrillation with incomplete right bundle branch block and occasional PVCs  2D ECHO:  TEE 2024 1. Left ventricular ejection fraction, by estimation, is 45 to 50%. The  left ventricle has mildly decreased function. The left ventricle  demonstrates global hypokinesis. There is mild left ventricular  hypertrophy.   2. D-shaped interventricular septum suggests a degree of RV  pressure/volume overload. Peak RV-RA gradient 59 mmHg. Right ventricular  systolic function is moderately reduced. The right ventricular size is  moderately enlarged.   3. Left atrial size was severely dilated. No left atrial/left atrial  appendage thrombus was detected.   4. Right atrial size was severely dilated.   5. 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   6. S/p MV repair via mTEER with 2 Pascal devices that are well-seated.  There is moderate residual mitral regurgitation. There is no systolic flow  reversal on pulmonary vein doppler interrogation. Mild mitral stenosis.  The mean  mitral valve gradient is  5.0 mmHg.   7. The aortic valve is tricuspid. There is severe calcifcation of the  aortic valve. Aortic valve regurgitation is not visualized.   CARDIAC CATH:   RHC June 2024: RA mean 9 RV 76/9 PA 74/24, mean 42 PCWP mean 16  Oxygen saturations: PA 76% AO 95%  Cardiac Output (Fick) 10.1  Cardiac Index (Fick) 4.77    STS RISK CALCULATOR: pending  NHYA CLASS: ***    ASSESSMENT AND PLAN:   Nonrheumatic aortic valve stenosis  Chronic diastolic CHF (congestive heart failure) (HCC)  S/P mitral valve clip implantation  Coronary artery disease involving native coronary artery of native heart without angina pectoris  Chronic atrial fibrillation (HCC)  Hyperlipidemia LDL goal <70  Primary hypertension  CKD (chronic kidney disease) stage 4, GFR 15-29 ml/min (HCC)   I have personally reviewed the patients imaging data as summarized above.  I have reviewed the natural history of aortic stenosis with the patient and family members who are present today. We have discussed the limitations of medical therapy and the poor prognosis associated with symptomatic aortic stenosis. We have also reviewed potential treatment options, including palliative medical therapy, conventional surgical aortic valve replacement, and transcatheter aortic valve replacement. We discussed treatment options in the context of this patient's specific comorbid medical conditions.   All of the patient's questions were answered today. Will make further recommendations based on the results of studies outlined above.   Total time spent with patient today *** minutes. This includes reviewing records, evaluating the patient and coordinating care.   Orbie Pyo, MD  01/14/2023 2:00 PM    [facility] [address] [place], [place], [place]  [address] Phone: [phone]; Fax: [phone]

## 2023-01-15 ENCOUNTER — Encounter (HOSPITAL_COMMUNITY): Payer: Medicare Other | Admitting: Cardiology

## 2023-01-15 LAB — ECHO TEE
AR max vel: 1.09 cm2
AV Area mean vel: 0.92 cm2
AV Mean grad: 25.7 mmHg
Ao pk vel: 3.21 m/s
MV VTI: 1.58 cm2

## 2023-01-16 ENCOUNTER — Encounter: Payer: Self-pay | Admitting: Internal Medicine

## 2023-01-16 ENCOUNTER — Ambulatory Visit: Payer: Medicare Other | Attending: Cardiovascular Disease | Admitting: Internal Medicine

## 2023-01-16 VITALS — BP 126/76 | HR 79 | Ht 68.0 in | Wt 234.0 lb

## 2023-01-16 DIAGNOSIS — I350 Nonrheumatic aortic (valve) stenosis: Secondary | ICD-10-CM | POA: Insufficient documentation

## 2023-01-16 DIAGNOSIS — E785 Hyperlipidemia, unspecified: Secondary | ICD-10-CM | POA: Insufficient documentation

## 2023-01-16 DIAGNOSIS — I10 Essential (primary) hypertension: Secondary | ICD-10-CM | POA: Insufficient documentation

## 2023-01-16 DIAGNOSIS — I2510 Atherosclerotic heart disease of native coronary artery without angina pectoris: Secondary | ICD-10-CM | POA: Insufficient documentation

## 2023-01-16 DIAGNOSIS — I482 Chronic atrial fibrillation, unspecified: Secondary | ICD-10-CM | POA: Insufficient documentation

## 2023-01-16 DIAGNOSIS — Z9889 Other specified postprocedural states: Secondary | ICD-10-CM | POA: Diagnosis not present

## 2023-01-16 DIAGNOSIS — N184 Chronic kidney disease, stage 4 (severe): Secondary | ICD-10-CM | POA: Insufficient documentation

## 2023-01-16 DIAGNOSIS — Z95818 Presence of other cardiac implants and grafts: Secondary | ICD-10-CM | POA: Insufficient documentation

## 2023-01-16 DIAGNOSIS — I5032 Chronic diastolic (congestive) heart failure: Secondary | ICD-10-CM | POA: Insufficient documentation

## 2023-01-16 NOTE — Progress Notes (Addendum)
Pre Surgical Assessment: 5 M Walk Test  4M=16.76ft  5 Meter Walk Test- trial 1: 6.47 seconds 5 Meter Walk Test- trial 2: 6.12 seconds 5 Meter Walk Test- trial 3: 5.50 seconds 5 Meter Walk Test Average: 6.03 seconds  ________________________  Procedure Type: Isolated AVR PERIOPERATIVE OUTCOME ESTIMATE % Operative Mortality 8.63% Morbidity & Mortality 23.4% Stroke 1.52% Renal Failure 15.3% Reoperation 6.18% Prolonged Ventilation 14.9% Deep Sternal Wound Infection 0.332% Long Hospital Stay (>14 days) 15.6% Short Hospital Stay (<6 days)* 16.5%

## 2023-01-16 NOTE — Patient Instructions (Signed)
Medication Instructions:  No changes *If you need a refill on your cardiac medications before your next appointment, please call your pharmacy*   Lab Work: none If you have labs (blood work) drawn today and your tests are completely normal, you will receive your results only by: MyChart Message (if you have MyChart) OR A paper copy in the mail If you have any lab test that is abnormal or we need to change your treatment, we will call you to review the results.   Testing/Procedures: Will plan for heart catheterization with Dr. Shirlee Latch.     Follow-Up: Per Structural Heart Team

## 2023-01-18 ENCOUNTER — Telehealth (HOSPITAL_COMMUNITY): Payer: Self-pay

## 2023-01-18 NOTE — Telephone Encounter (Signed)
Called patient to get a R& L Cath Scheduled with Dr. Shirlee Latch for next week. Left message for him to call the office at [phone] option 2

## 2023-01-22 NOTE — Telephone Encounter (Signed)
Pt returned call Pt is open to most day Ok for 7/5 7/16 or 7/19   Will arrange and return call to pt with details

## 2023-01-22 NOTE — Telephone Encounter (Signed)
Pt calling back to get heart cath scheduled. Will let CHF team know.

## 2023-01-23 ENCOUNTER — Other Ambulatory Visit (HOSPITAL_COMMUNITY): Payer: Self-pay

## 2023-01-23 DIAGNOSIS — I5032 Chronic diastolic (congestive) heart failure: Secondary | ICD-10-CM

## 2023-01-23 NOTE — Telephone Encounter (Signed)
Cath scheduled for 02/05/23 patient called and informed of date time and instructions given to patient. Letter also mailed to patient

## 2023-02-05 ENCOUNTER — Ambulatory Visit (HOSPITAL_COMMUNITY)
Admission: RE | Admit: 2023-02-05 | Discharge: 2023-02-05 | Disposition: A | Discharge status: Home / Self Care | Payer: Medicare Other | Attending: Cardiology | Admitting: Cardiology

## 2023-02-05 ENCOUNTER — Encounter (HOSPITAL_COMMUNITY)
Admission: RE | Disposition: A | Discharge status: Home / Self Care | Payer: Self-pay | Source: Home / Self Care | Attending: Cardiology

## 2023-02-05 ENCOUNTER — Other Ambulatory Visit: Payer: Self-pay

## 2023-02-05 DIAGNOSIS — I2510 Atherosclerotic heart disease of native coronary artery without angina pectoris: Secondary | ICD-10-CM | POA: Insufficient documentation

## 2023-02-05 DIAGNOSIS — I480 Paroxysmal atrial fibrillation: Secondary | ICD-10-CM | POA: Diagnosis not present

## 2023-02-05 DIAGNOSIS — I509 Heart failure, unspecified: Secondary | ICD-10-CM

## 2023-02-05 DIAGNOSIS — E785 Hyperlipidemia, unspecified: Secondary | ICD-10-CM | POA: Diagnosis not present

## 2023-02-05 DIAGNOSIS — I5032 Chronic diastolic (congestive) heart failure: Secondary | ICD-10-CM

## 2023-02-05 DIAGNOSIS — I350 Nonrheumatic aortic (valve) stenosis: Secondary | ICD-10-CM | POA: Diagnosis not present

## 2023-02-05 DIAGNOSIS — I482 Chronic atrial fibrillation, unspecified: Secondary | ICD-10-CM | POA: Diagnosis not present

## 2023-02-05 DIAGNOSIS — Z7901 Long term (current) use of anticoagulants: Secondary | ICD-10-CM | POA: Insufficient documentation

## 2023-02-05 DIAGNOSIS — E1122 Type 2 diabetes mellitus with diabetic chronic kidney disease: Secondary | ICD-10-CM | POA: Insufficient documentation

## 2023-02-05 DIAGNOSIS — I130 Hypertensive heart and chronic kidney disease with heart failure and stage 1 through stage 4 chronic kidney disease, or unspecified chronic kidney disease: Secondary | ICD-10-CM | POA: Diagnosis not present

## 2023-02-05 DIAGNOSIS — I080 Rheumatic disorders of both mitral and aortic valves: Secondary | ICD-10-CM | POA: Diagnosis not present

## 2023-02-05 DIAGNOSIS — Z87891 Personal history of nicotine dependence: Secondary | ICD-10-CM | POA: Diagnosis not present

## 2023-02-05 DIAGNOSIS — I5033 Acute on chronic diastolic (congestive) heart failure: Secondary | ICD-10-CM | POA: Insufficient documentation

## 2023-02-05 DIAGNOSIS — N184 Chronic kidney disease, stage 4 (severe): Secondary | ICD-10-CM | POA: Diagnosis not present

## 2023-02-05 DIAGNOSIS — I2721 Secondary pulmonary arterial hypertension: Secondary | ICD-10-CM | POA: Diagnosis not present

## 2023-02-05 HISTORY — PX: RIGHT/LEFT HEART CATH AND CORONARY ANGIOGRAPHY: CATH118266

## 2023-02-05 LAB — CBC
HCT: 32.2 % — ABNORMAL LOW (ref 39.0–52.0)
Hemoglobin: 10.5 g/dL — ABNORMAL LOW (ref 13.0–17.0)
MCH: 30.2 pg (ref 26.0–34.0)
MCHC: 32.6 g/dL (ref 30.0–36.0)
MCV: 92.5 fL (ref 80.0–100.0)
Platelets: 244 10*3/uL (ref 150–400)
RBC: 3.48 MIL/uL — ABNORMAL LOW (ref 4.22–5.81)
RDW: 13.5 % (ref 11.5–15.5)
WBC: 7 10*3/uL (ref 4.0–10.5)
nRBC: 0 % (ref 0.0–0.2)

## 2023-02-05 LAB — POCT I-STAT EG7
Acid-base deficit: 2 mmol/L (ref 0.0–2.0)
Acid-base deficit: 2 mmol/L (ref 0.0–2.0)
Bicarbonate: 22.6 mmol/L (ref 20.0–28.0)
Bicarbonate: 22.7 mmol/L (ref 20.0–28.0)
Calcium, Ion: 1.27 mmol/L (ref 1.15–1.40)
Calcium, Ion: 1.29 mmol/L (ref 1.15–1.40)
HCT: 32 % — ABNORMAL LOW (ref 39.0–52.0)
HCT: 32 % — ABNORMAL LOW (ref 39.0–52.0)
Hemoglobin: 10.9 g/dL — ABNORMAL LOW (ref 13.0–17.0)
Hemoglobin: 10.9 g/dL — ABNORMAL LOW (ref 13.0–17.0)
O2 Saturation: 68 %
O2 Saturation: 70 %
Potassium: 4.1 mmol/L (ref 3.5–5.1)
Potassium: 4.2 mmol/L (ref 3.5–5.1)
Sodium: 135 mmol/L (ref 135–145)
Sodium: 135 mmol/L (ref 135–145)
TCO2: 24 mmol/L (ref 22–32)
TCO2: 24 mmol/L (ref 22–32)
pCO2, Ven: 39 mmHg — ABNORMAL LOW (ref 44–60)
pCO2, Ven: 39.1 mmHg — ABNORMAL LOW (ref 44–60)
pH, Ven: 7.37 (ref 7.25–7.43)
pH, Ven: 7.372 (ref 7.25–7.43)
pO2, Ven: 36 mmHg (ref 32–45)
pO2, Ven: 37 mmHg (ref 32–45)

## 2023-02-05 LAB — BASIC METABOLIC PANEL
Anion gap: 11 (ref 5–15)
BUN: 64 mg/dL — ABNORMAL HIGH (ref 8–23)
CO2: 24 mmol/L (ref 22–32)
Calcium: 9.8 mg/dL (ref 8.9–10.3)
Chloride: 98 mmol/L (ref 98–111)
Creatinine, Ser: 2.22 mg/dL — ABNORMAL HIGH (ref 0.61–1.24)
GFR, Estimated: 29 mL/min — ABNORMAL LOW (ref >60)
Glucose, Bld: 112 mg/dL — ABNORMAL HIGH (ref 70–99)
Potassium: 4.1 mmol/L (ref 3.5–5.1)
Sodium: 133 mmol/L — ABNORMAL LOW (ref 135–145)

## 2023-02-05 SURGERY — RIGHT/LEFT HEART CATH AND CORONARY ANGIOGRAPHY
Anesthesia: LOCAL

## 2023-02-05 MED ORDER — SODIUM CHLORIDE 0.9 % IV SOLN: INTRAVENOUS | Status: DC

## 2023-02-05 MED ORDER — APIXABAN 5 MG PO TABS: 5.0000 mg | ORAL_TABLET | Freq: Two times a day (BID) | ORAL | 1 refills | Status: DC

## 2023-02-05 MED ORDER — IOHEXOL 350 MG/ML SOLN
INTRAVENOUS | Status: DC | PRN
  Administered 2023-02-05: 45 mL

## 2023-02-05 MED ORDER — ASPIRIN 81 MG PO CHEW
81.0000 mg | CHEWABLE_TABLET | Freq: Once | ORAL | Status: AC
  Administered 2023-02-05: 81 mg via ORAL
  Filled 2023-02-05: qty 1

## 2023-02-05 MED ORDER — FENTANYL CITRATE (PF) 100 MCG/2ML IJ SOLN
INTRAMUSCULAR | Status: DC | PRN
  Administered 2023-02-05: 25 ug via INTRAVENOUS

## 2023-02-05 MED ORDER — VERAPAMIL HCL 2.5 MG/ML IV SOLN
INTRAVENOUS | Status: AC
  Filled 2023-02-05: qty 2

## 2023-02-05 MED ORDER — MIDAZOLAM HCL 2 MG/2ML IJ SOLN
INTRAMUSCULAR | Status: AC
  Filled 2023-02-05: qty 2

## 2023-02-05 MED ORDER — VERAPAMIL HCL 2.5 MG/ML IV SOLN
INTRAVENOUS | Status: DC | PRN
  Administered 2023-02-05: 10 mL via INTRA_ARTERIAL

## 2023-02-05 MED ORDER — HEPARIN SODIUM (PORCINE) 1000 UNIT/ML IJ SOLN
INTRAMUSCULAR | Status: AC
  Filled 2023-02-05: qty 10

## 2023-02-05 MED ORDER — SODIUM CHLORIDE 0.9% FLUSH: 3.0000 mL | Freq: Two times a day (BID) | INTRAVENOUS | Status: DC

## 2023-02-05 MED ORDER — SODIUM CHLORIDE 0.9 % IV SOLN: 250.0000 mL | INTRAVENOUS | Status: DC | PRN

## 2023-02-05 MED ORDER — HEPARIN (PORCINE) IN NACL 1000-0.9 UT/500ML-% IV SOLN
INTRAVENOUS | Status: DC | PRN
  Administered 2023-02-05 (×2): 500 mL

## 2023-02-05 MED ORDER — HEPARIN SODIUM (PORCINE) 1000 UNIT/ML IJ SOLN
INTRAMUSCULAR | Status: DC | PRN
  Administered 2023-02-05: 5000 [IU] via INTRAVENOUS

## 2023-02-05 MED ORDER — LIDOCAINE HCL (PF) 1 % IJ SOLN
INTRAMUSCULAR | Status: DC | PRN
  Administered 2023-02-05 (×2): 2 mL via INTRADERMAL

## 2023-02-05 MED ORDER — FENTANYL CITRATE (PF) 100 MCG/2ML IJ SOLN
INTRAMUSCULAR | Status: AC
  Filled 2023-02-05: qty 2

## 2023-02-05 MED ORDER — LIDOCAINE HCL (PF) 1 % IJ SOLN
INTRAMUSCULAR | Status: AC
  Filled 2023-02-05: qty 30

## 2023-02-05 MED ORDER — SODIUM CHLORIDE 0.9% FLUSH: 3.0000 mL | INTRAVENOUS | Status: DC | PRN

## 2023-02-05 MED ORDER — MIDAZOLAM HCL 2 MG/2ML IJ SOLN
INTRAMUSCULAR | Status: DC | PRN
  Administered 2023-02-05 (×2): .5 mg via INTRAVENOUS

## 2023-02-05 SURGICAL SUPPLY — 11 items
CATH 5FR JL3.5 JR4 ANG PIG MP (CATHETERS) IMPLANT
CATH BALLN WEDGE 5F 110CM (CATHETERS) IMPLANT
DEVICE RAD COMP TR BAND LRG (VASCULAR PRODUCTS) IMPLANT
GLIDESHEATH SLEND SS 6F .021 (SHEATH) IMPLANT
GUIDEWIRE INQWIRE 1.5J.035X260 (WIRE) IMPLANT
INQWIRE 1.5J .035X260CM (WIRE) ×1
KIT HEART LEFT (KITS) ×1 IMPLANT
PACK CARDIAC CATHETERIZATION (CUSTOM PROCEDURE TRAY) ×1 IMPLANT
SHEATH GLIDE SLENDER 4/5FR (SHEATH) IMPLANT
TRANSDUCER W/STOPCOCK (MISCELLANEOUS) ×1 IMPLANT
WIRE EMERALD 3MM-J .025X260CM (WIRE) IMPLANT

## 2023-02-05 NOTE — Interval H&P Note (Signed)
History and Physical Interval Note:  02/05/2023 9:52 AM  Ryan Garner  has presented today for surgery, with the diagnosis of tavr workup.  The various methods of treatment have been discussed with the patient and family. After consideration of risks, benefits and other options for treatment, the patient has consented to  Procedure(s): RIGHT/LEFT HEART CATH AND CORONARY ANGIOGRAPHY (N/A) as a surgical intervention.  The patient's history has been reviewed, patient examined, no change in status, stable for surgery.  I have reviewed the patient's chart and labs.  Questions were answered to the patient's satisfaction.     Jakob Kimberlin [employer]

## 2023-02-06 ENCOUNTER — Telehealth: Payer: Self-pay

## 2023-02-06 ENCOUNTER — Encounter (HOSPITAL_COMMUNITY): Payer: Self-pay | Admitting: Cardiology

## 2023-02-06 NOTE — Telephone Encounter (Signed)
Late Entry:  I spoke with the patient today at 12:35 PM to discuss next steps in TAVR evaluation.  The pt is due to under go TAVR CT scans (coronary CT, CT angio chest/abd/pelvis with hydration protocol) but due to Creat >2.0 and GFR <30 the patient requires nephrology clearance per radiology protocol.  The patient is not established with a nephrologist and will require evaluation and clearance before TAVR CTs can be scheduled.  I have faxed a request to [facility] for urgent outpatient nephrology evaluation so that we can proceed with TAVR evaluation. Pt verbalized understanding of needing further evaluation by nephrology.

## 2023-02-11 ENCOUNTER — Other Ambulatory Visit: Payer: Self-pay | Admitting: Family

## 2023-02-20 DIAGNOSIS — N189 Chronic kidney disease, unspecified: Secondary | ICD-10-CM | POA: Diagnosis not present

## 2023-02-20 DIAGNOSIS — E871 Hypo-osmolality and hyponatremia: Secondary | ICD-10-CM | POA: Diagnosis not present

## 2023-02-20 DIAGNOSIS — I5032 Chronic diastolic (congestive) heart failure: Secondary | ICD-10-CM | POA: Diagnosis not present

## 2023-02-20 DIAGNOSIS — N184 Chronic kidney disease, stage 4 (severe): Secondary | ICD-10-CM | POA: Diagnosis not present

## 2023-02-20 DIAGNOSIS — I129 Hypertensive chronic kidney disease with stage 1 through stage 4 chronic kidney disease, or unspecified chronic kidney disease: Secondary | ICD-10-CM | POA: Diagnosis not present

## 2023-02-20 DIAGNOSIS — I350 Nonrheumatic aortic (valve) stenosis: Secondary | ICD-10-CM | POA: Diagnosis not present

## 2023-02-20 DIAGNOSIS — D631 Anemia in chronic kidney disease: Secondary | ICD-10-CM | POA: Diagnosis not present

## 2023-02-26 ENCOUNTER — Other Ambulatory Visit: Payer: Self-pay

## 2023-02-26 DIAGNOSIS — I350 Nonrheumatic aortic (valve) stenosis: Secondary | ICD-10-CM

## 2023-02-26 DIAGNOSIS — N184 Chronic kidney disease, stage 4 (severe): Secondary | ICD-10-CM

## 2023-03-06 ENCOUNTER — Ambulatory Visit (HOSPITAL_BASED_OUTPATIENT_CLINIC_OR_DEPARTMENT_OTHER)
Admission: RE | Admit: 2023-03-06 | Discharge: 2023-03-06 | Disposition: A | Discharge status: Home / Self Care | Payer: Medicare Other | Source: Ambulatory Visit

## 2023-03-06 ENCOUNTER — Ambulatory Visit (HOSPITAL_COMMUNITY)
Admission: RE | Admit: 2023-03-06 | Discharge: 2023-03-06 | Disposition: A | Discharge status: Home / Self Care | Payer: Medicare Other | Source: Ambulatory Visit | Attending: Internal Medicine | Admitting: Internal Medicine

## 2023-03-06 DIAGNOSIS — I350 Nonrheumatic aortic (valve) stenosis: Secondary | ICD-10-CM | POA: Insufficient documentation

## 2023-03-06 DIAGNOSIS — Z9049 Acquired absence of other specified parts of digestive tract: Secondary | ICD-10-CM | POA: Diagnosis not present

## 2023-03-06 DIAGNOSIS — Z01810 Encounter for preprocedural cardiovascular examination: Secondary | ICD-10-CM | POA: Diagnosis not present

## 2023-03-06 DIAGNOSIS — N184 Chronic kidney disease, stage 4 (severe): Secondary | ICD-10-CM | POA: Diagnosis not present

## 2023-03-06 DIAGNOSIS — I517 Cardiomegaly: Secondary | ICD-10-CM | POA: Diagnosis not present

## 2023-03-06 MED ORDER — IOHEXOL 350 MG/ML SOLN
100.0000 mL | Freq: Once | INTRAVENOUS | Status: AC | PRN
  Administered 2023-03-06: 100 mL via INTRAVENOUS

## 2023-03-06 MED ORDER — SODIUM CHLORIDE 0.9 % WEIGHT BASED INFUSION: 1.0000 mL/kg/h | INTRAVENOUS | Status: DC

## 2023-03-06 MED ORDER — SODIUM CHLORIDE 0.9 % WEIGHT BASED INFUSION
3.0000 mL/kg/h | INTRAVENOUS | Status: AC
  Administered 2023-03-06: 3 mL/kg/h via INTRAVENOUS

## 2023-03-06 NOTE — Progress Notes (Signed)
Notified Christy in CT of patient's 1 hour IVF will be in by 945am.

## 2023-03-20 ENCOUNTER — Encounter: Payer: Medicare Other | Admitting: Surgery

## 2023-04-01 DIAGNOSIS — D692 Other nonthrombocytopenic purpura: Secondary | ICD-10-CM | POA: Diagnosis not present

## 2023-04-01 DIAGNOSIS — L570 Actinic keratosis: Secondary | ICD-10-CM | POA: Diagnosis not present

## 2023-04-01 DIAGNOSIS — L814 Other melanin hyperpigmentation: Secondary | ICD-10-CM | POA: Diagnosis not present

## 2023-04-01 DIAGNOSIS — L821 Other seborrheic keratosis: Secondary | ICD-10-CM | POA: Diagnosis not present

## 2023-04-01 DIAGNOSIS — L578 Other skin changes due to chronic exposure to nonionizing radiation: Secondary | ICD-10-CM | POA: Diagnosis not present

## 2023-04-03 ENCOUNTER — Institutional Professional Consult (permissible substitution) (INDEPENDENT_AMBULATORY_CARE_PROVIDER_SITE_OTHER): Payer: Medicare Other | Admitting: Surgery

## 2023-04-03 ENCOUNTER — Encounter: Payer: Self-pay | Admitting: Surgery

## 2023-04-03 VITALS — BP 134/83 | HR 83 | Resp 18 | Ht 68.0 in | Wt 230.0 lb

## 2023-04-03 DIAGNOSIS — I350 Nonrheumatic aortic (valve) stenosis: Secondary | ICD-10-CM | POA: Diagnosis not present

## 2023-04-03 NOTE — Progress Notes (Signed)
palpable, no lower extremity edema  Rectal/GU  Deferred  Neuro:   Grossly non-focal and symmetrical throughout  Skin:   Clean and dry, no rashes, no breakdown  Diagnostic Tests:  ECHOCARDIOGRAM REPORT       Patient Name:   Ryan Garner Date of Exam: 11/16/2022  Medical Rec #:  [id number]      Height:       68.0 in  Accession #:    [id number]     Weight:       221.0 lb  Date of Birth:  05/10/1946       BSA:          2.132 m  Patient Age:    76  years       BP:           125/79 mmHg  Patient Gender: M              HR:           83 bpm.  Exam Location:  [address]   Procedure: 2D Echo, Cardiac Doppler and Color Doppler   Indications:    Z98.890, Z95.818 S/P mitral valve clip implantation    History:        Patient has prior history of Echocardiogram examinations,  most                 recent 10/11/2022. CHF, CAD and Previous Myocardial  Infarction,                 COPD, Arrythmias:Atrial Fibrillation; Risk  Factors:Hypertension,                 Diabetes and Dyslipidemia. Severe MR. Ascending aortic  aneurysm.                 Bradycardia. Aortic stenosis. Morbid obesity. Secondary                  pulmonary hypertension. Transient hypotension.    Sonographer:    Cathie Beams RCS  Referring Phys: Deri Fuelling MCDANIEL   IMPRESSIONS     1. S/P mitraclip x 2 (A2-P2, A3-P3) with mild to moderate MR and mean  gradient 6 mmHg (HR 84). Calcified aortic valve with severe AS (mean  gradient 21 mmHg; AVA 0.8 cm2; DI 0.25).   2. Left ventricular ejection fraction, by estimation, is 50 to 55%. The  left ventricle has low normal function. The left ventricle has no regional  wall motion abnormalities. The left ventricular internal cavity size was  mildly dilated. There is mild  left ventricular hypertrophy. Left ventricular diastolic function could  not be evaluated. There is the interventricular septum is flattened in  systole and diastole, consistent with right ventricular pressure and  volume overload.   3. Right ventricular systolic function is moderately reduced. The right  ventricular size is moderately enlarged.   4. Left atrial size was severely dilated.   5. Right atrial size was severely dilated.   6. The mitral valve is normal in structure. Mild to moderate mitral valve  regurgitation. No evidence of mitral stenosis. Procedure Date: 10/10/22.   7. The aortic valve is calcified. Aortic valve regurgitation is not   visualized. Severe aortic valve stenosis.   8. The inferior vena cava is dilated in size with >50% respiratory  variability, suggesting right atrial pressure of 8 mmHg.   9. There is a small secundum atrial septal defect with predominantly left  to right shunting across the atrial  Patient ID: Ryan Garner, male   DOB: Nov 11, 1945, 77 y.o.   MRN: [id number]  [facility]       [address]       [place] [id number]             [phone]          CARDIOTHORACIC SURGERY CONSULTATION REPORT  PCP is [name], [name], MD Referring Provider is Alverda Skeans, MD Primary Cardiologist is [name], MD  Reason for consultation:  Severe aortic stenosis  HPI:  The patient is a 77 year old gentleman with a history of diabetes, hypertension, hyperlipidemia, coronary disease status post STEMI and PCI of the left circumflex in 2021, severe functional mitral regurgitation status post mitral transcatheter edge-to-edge repair with PASCAL ACE (x2) in March 2023 complicated by a rectus sheath hematoma, stage IV chronic kidney disease, chronic diastolic congestive heart failure, and paradoxical low-flow/low gradient aortic stenosis.  A 2D echo on 11/16/2022 showed a calcified aortic valve with a mean gradient of 20.7 mmHg and a peak gradient of 33.6 mmHg with a valve area of 0.8 cm by VTI.  Dimensionless index was 0.25 and stroke-volume index was low at 24.  Left ventricular ejection fraction was 50 to 55%.  There was mild to moderate residual mitral regurgitation with no evidence of mitral stenosis.  He reports feeling better since his mitral valve procedure but continues to have some exertional shortness of breath and fatigue.  He denies any dizziness or syncope.  He has had some peripheral edema.  He denies orthopnea and PND.  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 Excell Seltzer and Dr. Lynnette Caffey   Sleep apnea    STEMI (ST elevation myocardial infarction) [facility])     Past Surgical History:  Procedure Laterality Date   APPENDECTOMY     BUBBLE STUDY  10/02/2022   Procedure: BUBBLE STUDY;  Surgeon: Laurey Morale, MD;  Location: [facility] ENDOSCOPY;  Service: Cardiovascular;;   CHOLECYSTECTOMY N/A 03/02/2020   Procedure: LAPAROSCOPIC CHOLECYSTECTOMY;  Surgeon: Franky Macho, MD;  Location: [facility]S;  Service: General;  Laterality: N/A;   CORONARY STENT INTERVENTION N/A 04/30/2020   Procedure: CORONARY STENT INTERVENTION;  Surgeon: Swaziland, Peter M, MD;  Location: [facility] INVASIVE CV LAB;  Service: Cardiovascular;  Laterality: N/A;   CORONARY/GRAFT ACUTE MI REVASCULARIZATION N/A 04/30/2020   Procedure: Coronary/Graft Acute MI Revascularization;  Surgeon: Swaziland, Peter M, MD;  Location: [facility] INVASIVE CV LAB;  Service: Cardiovascular;  Laterality: N/A;   Eyelid Surgery Bilateral    HERNIA REPAIR     I & D EXTREMITY Left 04/06/2022   Procedure: LEFT LEG DEBRIDEMENT;  Surgeon: Nadara Mustard, MD;  Location: [facility] OR;  Service: Orthopedics;  Laterality: Left;   I & D EXTREMITY Left 04/04/2022   Procedure: LEFT LEG DEBRIDEMENT;  Surgeon: Nadara Mustard, MD;  Location: [facility] OR;  Service: Orthopedics;  Laterality: Left;   knee tendons repair     LEFT HEART CATH AND CORONARY ANGIOGRAPHY  Patient ID: Ryan Garner, male   DOB: Nov 11, 1945, 77 y.o.   MRN: [id number]  [facility]       [address]       [place] [id number]             [phone]          CARDIOTHORACIC SURGERY CONSULTATION REPORT  PCP is [name], [name], MD Referring Provider is Alverda Skeans, MD Primary Cardiologist is [name], MD  Reason for consultation:  Severe aortic stenosis  HPI:  The patient is a 77 year old gentleman with a history of diabetes, hypertension, hyperlipidemia, coronary disease status post STEMI and PCI of the left circumflex in 2021, severe functional mitral regurgitation status post mitral transcatheter edge-to-edge repair with PASCAL ACE (x2) in March 2023 complicated by a rectus sheath hematoma, stage IV chronic kidney disease, chronic diastolic congestive heart failure, and paradoxical low-flow/low gradient aortic stenosis.  A 2D echo on 11/16/2022 showed a calcified aortic valve with a mean gradient of 20.7 mmHg and a peak gradient of 33.6 mmHg with a valve area of 0.8 cm by VTI.  Dimensionless index was 0.25 and stroke-volume index was low at 24.  Left ventricular ejection fraction was 50 to 55%.  There was mild to moderate residual mitral regurgitation with no evidence of mitral stenosis.  He reports feeling better since his mitral valve procedure but continues to have some exertional shortness of breath and fatigue.  He denies any dizziness or syncope.  He has had some peripheral edema.  He denies orthopnea and PND.  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 Excell Seltzer and Dr. Lynnette Caffey   Sleep apnea    STEMI (ST elevation myocardial infarction) [facility])     Past Surgical History:  Procedure Laterality Date   APPENDECTOMY     BUBBLE STUDY  10/02/2022   Procedure: BUBBLE STUDY;  Surgeon: Laurey Morale, MD;  Location: [facility] ENDOSCOPY;  Service: Cardiovascular;;   CHOLECYSTECTOMY N/A 03/02/2020   Procedure: LAPAROSCOPIC CHOLECYSTECTOMY;  Surgeon: Franky Macho, MD;  Location: [facility];  Service: General;  Laterality: N/A;   CORONARY STENT INTERVENTION N/A 04/30/2020   Procedure: CORONARY STENT INTERVENTION;  Surgeon: Swaziland, Peter M, MD;  Location: [facility] INVASIVE CV LAB;  Service: Cardiovascular;  Laterality: N/A;   CORONARY/GRAFT ACUTE MI REVASCULARIZATION N/A 04/30/2020   Procedure: Coronary/Graft Acute MI Revascularization;  Surgeon: Swaziland, Peter M, MD;  Location: [facility] INVASIVE CV LAB;  Service: Cardiovascular;  Laterality: N/A;   Eyelid Surgery Bilateral    HERNIA REPAIR     I & D EXTREMITY Left 04/06/2022   Procedure: LEFT LEG DEBRIDEMENT;  Surgeon: Nadara Mustard, MD;  Location: [facility] OR;  Service: Orthopedics;  Laterality: Left;   I & D EXTREMITY Left 04/04/2022   Procedure: LEFT LEG DEBRIDEMENT;  Surgeon: Nadara Mustard, MD;  Location: [facility] OR;  Service: Orthopedics;  Laterality: Left;   knee tendons repair     LEFT HEART CATH AND CORONARY ANGIOGRAPHY  Patient ID: Ryan Garner, male   DOB: Nov 11, 1945, 77 y.o.   MRN: [id number]  [facility]       [address]       [place] [id number]             [phone]          CARDIOTHORACIC SURGERY CONSULTATION REPORT  PCP is [name], [name], MD Referring Provider is Alverda Skeans, MD Primary Cardiologist is [name], MD  Reason for consultation:  Severe aortic stenosis  HPI:  The patient is a 77 year old gentleman with a history of diabetes, hypertension, hyperlipidemia, coronary disease status post STEMI and PCI of the left circumflex in 2021, severe functional mitral regurgitation status post mitral transcatheter edge-to-edge repair with PASCAL ACE (x2) in March 2023 complicated by a rectus sheath hematoma, stage IV chronic kidney disease, chronic diastolic congestive heart failure, and paradoxical low-flow/low gradient aortic stenosis.  A 2D echo on 11/16/2022 showed a calcified aortic valve with a mean gradient of 20.7 mmHg and a peak gradient of 33.6 mmHg with a valve area of 0.8 cm by VTI.  Dimensionless index was 0.25 and stroke-volume index was low at 24.  Left ventricular ejection fraction was 50 to 55%.  There was mild to moderate residual mitral regurgitation with no evidence of mitral stenosis.  He reports feeling better since his mitral valve procedure but continues to have some exertional shortness of breath and fatigue.  He denies any dizziness or syncope.  He has had some peripheral edema.  He denies orthopnea and PND.  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 Excell Seltzer and Dr. Lynnette Caffey   Sleep apnea    STEMI (ST elevation myocardial infarction) [facility])     Past Surgical History:  Procedure Laterality Date   APPENDECTOMY     BUBBLE STUDY  10/02/2022   Procedure: BUBBLE STUDY;  Surgeon: Laurey Morale, MD;  Location: [facility] ENDOSCOPY;  Service: Cardiovascular;;   CHOLECYSTECTOMY N/A 03/02/2020   Procedure: LAPAROSCOPIC CHOLECYSTECTOMY;  Surgeon: Franky Macho, MD;  Location: [facility];  Service: General;  Laterality: N/A;   CORONARY STENT INTERVENTION N/A 04/30/2020   Procedure: CORONARY STENT INTERVENTION;  Surgeon: Swaziland, Peter M, MD;  Location: [facility] INVASIVE CV LAB;  Service: Cardiovascular;  Laterality: N/A;   CORONARY/GRAFT ACUTE MI REVASCULARIZATION N/A 04/30/2020   Procedure: Coronary/Graft Acute MI Revascularization;  Surgeon: Swaziland, Peter M, MD;  Location: [facility] INVASIVE CV LAB;  Service: Cardiovascular;  Laterality: N/A;   Eyelid Surgery Bilateral    HERNIA REPAIR     I & D EXTREMITY Left 04/06/2022   Procedure: LEFT LEG DEBRIDEMENT;  Surgeon: Nadara Mustard, MD;  Location: [facility] OR;  Service: Orthopedics;  Laterality: Left;   I & D EXTREMITY Left 04/04/2022   Procedure: LEFT LEG DEBRIDEMENT;  Surgeon: Nadara Mustard, MD;  Location: [facility] OR;  Service: Orthopedics;  Laterality: Left;   knee tendons repair     LEFT HEART CATH AND CORONARY ANGIOGRAPHY  Patient ID: Ryan Garner, male   DOB: Nov 11, 1945, 77 y.o.   MRN: [id number]  [facility]       [address]       [place] [id number]             [phone]          CARDIOTHORACIC SURGERY CONSULTATION REPORT  PCP is [name], [name], MD Referring Provider is Alverda Skeans, MD Primary Cardiologist is [name], MD  Reason for consultation:  Severe aortic stenosis  HPI:  The patient is a 77 year old gentleman with a history of diabetes, hypertension, hyperlipidemia, coronary disease status post STEMI and PCI of the left circumflex in 2021, severe functional mitral regurgitation status post mitral transcatheter edge-to-edge repair with PASCAL ACE (x2) in March 2023 complicated by a rectus sheath hematoma, stage IV chronic kidney disease, chronic diastolic congestive heart failure, and paradoxical low-flow/low gradient aortic stenosis.  A 2D echo on 11/16/2022 showed a calcified aortic valve with a mean gradient of 20.7 mmHg and a peak gradient of 33.6 mmHg with a valve area of 0.8 cm by VTI.  Dimensionless index was 0.25 and stroke-volume index was low at 24.  Left ventricular ejection fraction was 50 to 55%.  There was mild to moderate residual mitral regurgitation with no evidence of mitral stenosis.  He reports feeling better since his mitral valve procedure but continues to have some exertional shortness of breath and fatigue.  He denies any dizziness or syncope.  He has had some peripheral edema.  He denies orthopnea and PND.  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 Excell Seltzer and Dr. Lynnette Caffey   Sleep apnea    STEMI (ST elevation myocardial infarction) [facility])     Past Surgical History:  Procedure Laterality Date   APPENDECTOMY     BUBBLE STUDY  10/02/2022   Procedure: BUBBLE STUDY;  Surgeon: Laurey Morale, MD;  Location: [facility] ENDOSCOPY;  Service: Cardiovascular;;   CHOLECYSTECTOMY N/A 03/02/2020   Procedure: LAPAROSCOPIC CHOLECYSTECTOMY;  Surgeon: Franky Macho, MD;  Location: [facility];  Service: General;  Laterality: N/A;   CORONARY STENT INTERVENTION N/A 04/30/2020   Procedure: CORONARY STENT INTERVENTION;  Surgeon: Swaziland, Peter M, MD;  Location: [facility] INVASIVE CV LAB;  Service: Cardiovascular;  Laterality: N/A;   CORONARY/GRAFT ACUTE MI REVASCULARIZATION N/A 04/30/2020   Procedure: Coronary/Graft Acute MI Revascularization;  Surgeon: Swaziland, Peter M, MD;  Location: [facility] INVASIVE CV LAB;  Service: Cardiovascular;  Laterality: N/A;   Eyelid Surgery Bilateral    HERNIA REPAIR     I & D EXTREMITY Left 04/06/2022   Procedure: LEFT LEG DEBRIDEMENT;  Surgeon: Nadara Mustard, MD;  Location: [facility] OR;  Service: Orthopedics;  Laterality: Left;   I & D EXTREMITY Left 04/04/2022   Procedure: LEFT LEG DEBRIDEMENT;  Surgeon: Nadara Mustard, MD;  Location: [facility] OR;  Service: Orthopedics;  Laterality: Left;   knee tendons repair     LEFT HEART CATH AND CORONARY ANGIOGRAPHY  Patient ID: Ryan Garner, male   DOB: Nov 11, 1945, 77 y.o.   MRN: [id number]  [facility]       [address]       [place] [id number]             [phone]          CARDIOTHORACIC SURGERY CONSULTATION REPORT  PCP is [name], [name], MD Referring Provider is Alverda Skeans, MD Primary Cardiologist is [name], MD  Reason for consultation:  Severe aortic stenosis  HPI:  The patient is a 77 year old gentleman with a history of diabetes, hypertension, hyperlipidemia, coronary disease status post STEMI and PCI of the left circumflex in 2021, severe functional mitral regurgitation status post mitral transcatheter edge-to-edge repair with PASCAL ACE (x2) in March 2023 complicated by a rectus sheath hematoma, stage IV chronic kidney disease, chronic diastolic congestive heart failure, and paradoxical low-flow/low gradient aortic stenosis.  A 2D echo on 11/16/2022 showed a calcified aortic valve with a mean gradient of 20.7 mmHg and a peak gradient of 33.6 mmHg with a valve area of 0.8 cm by VTI.  Dimensionless index was 0.25 and stroke-volume index was low at 24.  Left ventricular ejection fraction was 50 to 55%.  There was mild to moderate residual mitral regurgitation with no evidence of mitral stenosis.  He reports feeling better since his mitral valve procedure but continues to have some exertional shortness of breath and fatigue.  He denies any dizziness or syncope.  He has had some peripheral edema.  He denies orthopnea and PND.  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 Excell Seltzer and Dr. Lynnette Caffey   Sleep apnea    STEMI (ST elevation myocardial infarction) [facility])     Past Surgical History:  Procedure Laterality Date   APPENDECTOMY     BUBBLE STUDY  10/02/2022   Procedure: BUBBLE STUDY;  Surgeon: Laurey Morale, MD;  Location: [facility] ENDOSCOPY;  Service: Cardiovascular;;   CHOLECYSTECTOMY N/A 03/02/2020   Procedure: LAPAROSCOPIC CHOLECYSTECTOMY;  Surgeon: Franky Macho, MD;  Location: [facility];  Service: General;  Laterality: N/A;   CORONARY STENT INTERVENTION N/A 04/30/2020   Procedure: CORONARY STENT INTERVENTION;  Surgeon: Swaziland, Peter M, MD;  Location: [facility] INVASIVE CV LAB;  Service: Cardiovascular;  Laterality: N/A;   CORONARY/GRAFT ACUTE MI REVASCULARIZATION N/A 04/30/2020   Procedure: Coronary/Graft Acute MI Revascularization;  Surgeon: Swaziland, Peter M, MD;  Location: [facility] INVASIVE CV LAB;  Service: Cardiovascular;  Laterality: N/A;   Eyelid Surgery Bilateral    HERNIA REPAIR     I & D EXTREMITY Left 04/06/2022   Procedure: LEFT LEG DEBRIDEMENT;  Surgeon: Nadara Mustard, MD;  Location: [facility] OR;  Service: Orthopedics;  Laterality: Left;   I & D EXTREMITY Left 04/04/2022   Procedure: LEFT LEG DEBRIDEMENT;  Surgeon: Nadara Mustard, MD;  Location: [facility] OR;  Service: Orthopedics;  Laterality: Left;   knee tendons repair     LEFT HEART CATH AND CORONARY ANGIOGRAPHY  palpable, no lower extremity edema  Rectal/GU  Deferred  Neuro:   Grossly non-focal and symmetrical throughout  Skin:   Clean and dry, no rashes, no breakdown  Diagnostic Tests:  ECHOCARDIOGRAM REPORT       Patient Name:   Ryan Garner Date of Exam: 11/16/2022  Medical Rec #:  [id number]      Height:       68.0 in  Accession #:    [id number]     Weight:       221.0 lb  Date of Birth:  05/10/1946       BSA:          2.132 m  Patient Age:    76  years       BP:           125/79 mmHg  Patient Gender: M              HR:           83 bpm.  Exam Location:  [address]   Procedure: 2D Echo, Cardiac Doppler and Color Doppler   Indications:    Z98.890, Z95.818 S/P mitral valve clip implantation    History:        Patient has prior history of Echocardiogram examinations,  most                 recent 10/11/2022. CHF, CAD and Previous Myocardial  Infarction,                 COPD, Arrythmias:Atrial Fibrillation; Risk  Factors:Hypertension,                 Diabetes and Dyslipidemia. Severe MR. Ascending aortic  aneurysm.                 Bradycardia. Aortic stenosis. Morbid obesity. Secondary                  pulmonary hypertension. Transient hypotension.    Sonographer:    Cathie Beams RCS  Referring Phys: Deri Fuelling MCDANIEL   IMPRESSIONS     1. S/P mitraclip x 2 (A2-P2, A3-P3) with mild to moderate MR and mean  gradient 6 mmHg (HR 84). Calcified aortic valve with severe AS (mean  gradient 21 mmHg; AVA 0.8 cm2; DI 0.25).   2. Left ventricular ejection fraction, by estimation, is 50 to 55%. The  left ventricle has low normal function. The left ventricle has no regional  wall motion abnormalities. The left ventricular internal cavity size was  mildly dilated. There is mild  left ventricular hypertrophy. Left ventricular diastolic function could  not be evaluated. There is the interventricular septum is flattened in  systole and diastole, consistent with right ventricular pressure and  volume overload.   3. Right ventricular systolic function is moderately reduced. The right  ventricular size is moderately enlarged.   4. Left atrial size was severely dilated.   5. Right atrial size was severely dilated.   6. The mitral valve is normal in structure. Mild to moderate mitral valve  regurgitation. No evidence of mitral stenosis. Procedure Date: 10/10/22.   7. The aortic valve is calcified. Aortic valve regurgitation is not   visualized. Severe aortic valve stenosis.   8. The inferior vena cava is dilated in size with >50% respiratory  variability, suggesting right atrial pressure of 8 mmHg.   9. There is a small secundum atrial septal defect with predominantly left  to right shunting across the atrial  Patient ID: Ryan Garner, male   DOB: Nov 11, 1945, 77 y.o.   MRN: [id number]  [facility]       [address]       [place] [id number]             [phone]          CARDIOTHORACIC SURGERY CONSULTATION REPORT  PCP is [name], [name], MD Referring Provider is Alverda Skeans, MD Primary Cardiologist is [name], MD  Reason for consultation:  Severe aortic stenosis  HPI:  The patient is a 77 year old gentleman with a history of diabetes, hypertension, hyperlipidemia, coronary disease status post STEMI and PCI of the left circumflex in 2021, severe functional mitral regurgitation status post mitral transcatheter edge-to-edge repair with PASCAL ACE (x2) in March 2023 complicated by a rectus sheath hematoma, stage IV chronic kidney disease, chronic diastolic congestive heart failure, and paradoxical low-flow/low gradient aortic stenosis.  A 2D echo on 11/16/2022 showed a calcified aortic valve with a mean gradient of 20.7 mmHg and a peak gradient of 33.6 mmHg with a valve area of 0.8 cm by VTI.  Dimensionless index was 0.25 and stroke-volume index was low at 24.  Left ventricular ejection fraction was 50 to 55%.  There was mild to moderate residual mitral regurgitation with no evidence of mitral stenosis.  He reports feeling better since his mitral valve procedure but continues to have some exertional shortness of breath and fatigue.  He denies any dizziness or syncope.  He has had some peripheral edema.  He denies orthopnea and PND.  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 Excell Seltzer and Dr. Lynnette Caffey   Sleep apnea    STEMI (ST elevation myocardial infarction) [facility])     Past Surgical History:  Procedure Laterality Date   APPENDECTOMY     BUBBLE STUDY  10/02/2022   Procedure: BUBBLE STUDY;  Surgeon: Laurey Morale, MD;  Location: [facility] ENDOSCOPY;  Service: Cardiovascular;;   CHOLECYSTECTOMY N/A 03/02/2020   Procedure: LAPAROSCOPIC CHOLECYSTECTOMY;  Surgeon: Franky Macho, MD;  Location: [facility] ORS;  Service: General;  Laterality: N/A;   CORONARY STENT INTERVENTION N/A 04/30/2020   Procedure: CORONARY STENT INTERVENTION;  Surgeon: Swaziland, Peter M, MD;  Location: [facility] INVASIVE CV LAB;  Service: Cardiovascular;  Laterality: N/A;   CORONARY/GRAFT ACUTE MI REVASCULARIZATION N/A 04/30/2020   Procedure: Coronary/Graft Acute MI Revascularization;  Surgeon: Swaziland, Peter M, MD;  Location: [facility] INVASIVE CV LAB;  Service: Cardiovascular;  Laterality: N/A;   Eyelid Surgery Bilateral    HERNIA REPAIR     I & D EXTREMITY Left 04/06/2022   Procedure: LEFT LEG DEBRIDEMENT;  Surgeon: Nadara Mustard, MD;  Location: [facility] OR;  Service: Orthopedics;  Laterality: Left;   I & D EXTREMITY Left 04/04/2022   Procedure: LEFT LEG DEBRIDEMENT;  Surgeon: Nadara Mustard, MD;  Location: [facility] OR;  Service: Orthopedics;  Laterality: Left;   knee tendons repair     LEFT HEART CATH AND CORONARY ANGIOGRAPHY  palpable, no lower extremity edema  Rectal/GU  Deferred  Neuro:   Grossly non-focal and symmetrical throughout  Skin:   Clean and dry, no rashes, no breakdown  Diagnostic Tests:  ECHOCARDIOGRAM REPORT       Patient Name:   Ryan Garner Date of Exam: 11/16/2022  Medical Rec #:  [id number]      Height:       68.0 in  Accession #:    [id number]     Weight:       221.0 lb  Date of Birth:  05/10/1946       BSA:          2.132 m  Patient Age:    76  years       BP:           125/79 mmHg  Patient Gender: M              HR:           83 bpm.  Exam Location:  [address]   Procedure: 2D Echo, Cardiac Doppler and Color Doppler   Indications:    Z98.890, Z95.818 S/P mitral valve clip implantation    History:        Patient has prior history of Echocardiogram examinations,  most                 recent 10/11/2022. CHF, CAD and Previous Myocardial  Infarction,                 COPD, Arrythmias:Atrial Fibrillation; Risk  Factors:Hypertension,                 Diabetes and Dyslipidemia. Severe MR. Ascending aortic  aneurysm.                 Bradycardia. Aortic stenosis. Morbid obesity. Secondary                  pulmonary hypertension. Transient hypotension.    Sonographer:    Cathie Beams RCS  Referring Phys: Deri Fuelling MCDANIEL   IMPRESSIONS     1. S/P mitraclip x 2 (A2-P2, A3-P3) with mild to moderate MR and mean  gradient 6 mmHg (HR 84). Calcified aortic valve with severe AS (mean  gradient 21 mmHg; AVA 0.8 cm2; DI 0.25).   2. Left ventricular ejection fraction, by estimation, is 50 to 55%. The  left ventricle has low normal function. The left ventricle has no regional  wall motion abnormalities. The left ventricular internal cavity size was  mildly dilated. There is mild  left ventricular hypertrophy. Left ventricular diastolic function could  not be evaluated. There is the interventricular septum is flattened in  systole and diastole, consistent with right ventricular pressure and  volume overload.   3. Right ventricular systolic function is moderately reduced. The right  ventricular size is moderately enlarged.   4. Left atrial size was severely dilated.   5. Right atrial size was severely dilated.   6. The mitral valve is normal in structure. Mild to moderate mitral valve  regurgitation. No evidence of mitral stenosis. Procedure Date: 10/10/22.   7. The aortic valve is calcified. Aortic valve regurgitation is not   visualized. Severe aortic valve stenosis.   8. The inferior vena cava is dilated in size with >50% respiratory  variability, suggesting right atrial pressure of 8 mmHg.   9. There is a small secundum atrial septal defect with predominantly left  to right shunting across the atrial  Patient ID: Ryan Garner, male   DOB: Nov 11, 1945, 77 y.o.   MRN: [id number]  [facility]       [address]       [place] [address]             [phone]          CARDIOTHORACIC SURGERY CONSULTATION REPORT  PCP is [name], [name], MD Referring Provider is Alverda Skeans, MD Primary Cardiologist is [name], MD  Reason for consultation:  Severe aortic stenosis  HPI:  The patient is a 77 year old gentleman with a history of diabetes, hypertension, hyperlipidemia, coronary disease status post STEMI and PCI of the left circumflex in 2021, severe functional mitral regurgitation status post mitral transcatheter edge-to-edge repair with PASCAL ACE (x2) in March 2023 complicated by a rectus sheath hematoma, stage IV chronic kidney disease, chronic diastolic congestive heart failure, and paradoxical low-flow/low gradient aortic stenosis.  A 2D echo on 11/16/2022 showed a calcified aortic valve with a mean gradient of 20.7 mmHg and a peak gradient of 33.6 mmHg with a valve area of 0.8 cm by VTI.  Dimensionless index was 0.25 and stroke-volume index was low at 24.  Left ventricular ejection fraction was 50 to 55%.  There was mild to moderate residual mitral regurgitation with no evidence of mitral stenosis.  He reports feeling better since his mitral valve procedure but continues to have some exertional shortness of breath and fatigue.  He denies any dizziness or syncope.  He has had some peripheral edema.  He denies orthopnea and PND.  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 Excell Seltzer and Dr. Lynnette Caffey   Sleep apnea    STEMI (ST elevation myocardial infarction) [facility])     Past Surgical History:  Procedure Laterality Date   APPENDECTOMY     BUBBLE STUDY  10/02/2022   Procedure: BUBBLE STUDY;  Surgeon: Laurey Morale, MD;  Location: [facility] ENDOSCOPY;  Service: Cardiovascular;;   CHOLECYSTECTOMY N/A 03/02/2020   Procedure: LAPAROSCOPIC CHOLECYSTECTOMY;  Surgeon: Franky Macho, MD;  Location: [facility]S;  Service: General;  Laterality: N/A;   CORONARY STENT INTERVENTION N/A 04/30/2020   Procedure: CORONARY STENT INTERVENTION;  Surgeon: Swaziland, Peter M, MD;  Location: [facility] INVASIVE CV LAB;  Service: Cardiovascular;  Laterality: N/A;   CORONARY/GRAFT ACUTE MI REVASCULARIZATION N/A 04/30/2020   Procedure: Coronary/Graft Acute MI Revascularization;  Surgeon: Swaziland, Peter M, MD;  Location: [facility] INVASIVE CV LAB;  Service: Cardiovascular;  Laterality: N/A;   Eyelid Surgery Bilateral    HERNIA REPAIR     I & D EXTREMITY Left 04/06/2022   Procedure: LEFT LEG DEBRIDEMENT;  Surgeon: Nadara Mustard, MD;  Location: [facility] OR;  Service: Orthopedics;  Laterality: Left;   I & D EXTREMITY Left 04/04/2022   Procedure: LEFT LEG DEBRIDEMENT;  Surgeon: Nadara Mustard, MD;  Location: [facility] OR;  Service: Orthopedics;  Laterality: Left;   knee tendons repair     LEFT HEART CATH AND CORONARY ANGIOGRAPHY  palpable, no lower extremity edema  Rectal/GU  Deferred  Neuro:   Grossly non-focal and symmetrical throughout  Skin:   Clean and dry, no rashes, no breakdown  Diagnostic Tests:  ECHOCARDIOGRAM REPORT       Patient Name:   Ryan Garner Date of Exam: 11/16/2022  Medical Rec #:  [id number]      Height:       68.0 in  Accession #:    [id number]     Weight:       221.0 lb  Date of Birth:  05/10/1946       BSA:          2.132 m  Patient Age:    76  years       BP:           125/79 mmHg  Patient Gender: M              HR:           83 bpm.  Exam Location:  [address]   Procedure: 2D Echo, Cardiac Doppler and Color Doppler   Indications:    Z98.890, Z95.818 S/P mitral valve clip implantation    History:        Patient has prior history of Echocardiogram examinations,  most                 recent 10/11/2022. CHF, CAD and Previous Myocardial  Infarction,                 COPD, Arrythmias:Atrial Fibrillation; Risk  Factors:Hypertension,                 Diabetes and Dyslipidemia. Severe MR. Ascending aortic  aneurysm.                 Bradycardia. Aortic stenosis. Morbid obesity. Secondary                  pulmonary hypertension. Transient hypotension.    Sonographer:    Cathie Beams RCS  Referring Phys: Deri Fuelling MCDANIEL   IMPRESSIONS     1. S/P mitraclip x 2 (A2-P2, A3-P3) with mild to moderate MR and mean  gradient 6 mmHg (HR 84). Calcified aortic valve with severe AS (mean  gradient 21 mmHg; AVA 0.8 cm2; DI 0.25).   2. Left ventricular ejection fraction, by estimation, is 50 to 55%. The  left ventricle has low normal function. The left ventricle has no regional  wall motion abnormalities. The left ventricular internal cavity size was  mildly dilated. There is mild  left ventricular hypertrophy. Left ventricular diastolic function could  not be evaluated. There is the interventricular septum is flattened in  systole and diastole, consistent with right ventricular pressure and  volume overload.   3. Right ventricular systolic function is moderately reduced. The right  ventricular size is moderately enlarged.   4. Left atrial size was severely dilated.   5. Right atrial size was severely dilated.   6. The mitral valve is normal in structure. Mild to moderate mitral valve  regurgitation. No evidence of mitral stenosis. Procedure Date: 10/10/22.   7. The aortic valve is calcified. Aortic valve regurgitation is not   visualized. Severe aortic valve stenosis.   8. The inferior vena cava is dilated in size with >50% respiratory  variability, suggesting right atrial pressure of 8 mmHg.   9. There is a small secundum atrial septal defect with predominantly left  to right shunting across the atrial  Patient ID: Ryan Garner, male   DOB: Nov 11, 1945, 77 y.o.   MRN: [id number]  [facility]       [address]       [place] [address]             [phone]          CARDIOTHORACIC SURGERY CONSULTATION REPORT  PCP is [name], [name], MD Referring Provider is Alverda Skeans, MD Primary Cardiologist is [name], MD  Reason for consultation:  Severe aortic stenosis  HPI:  The patient is a 77 year old gentleman with a history of diabetes, hypertension, hyperlipidemia, coronary disease status post STEMI and PCI of the left circumflex in 2021, severe functional mitral regurgitation status post mitral transcatheter edge-to-edge repair with PASCAL ACE (x2) in March 2023 complicated by a rectus sheath hematoma, stage IV chronic kidney disease, chronic diastolic congestive heart failure, and paradoxical low-flow/low gradient aortic stenosis.  A 2D echo on 11/16/2022 showed a calcified aortic valve with a mean gradient of 20.7 mmHg and a peak gradient of 33.6 mmHg with a valve area of 0.8 cm by VTI.  Dimensionless index was 0.25 and stroke-volume index was low at 24.  Left ventricular ejection fraction was 50 to 55%.  There was mild to moderate residual mitral regurgitation with no evidence of mitral stenosis.  He reports feeling better since his mitral valve procedure but continues to have some exertional shortness of breath and fatigue.  He denies any dizziness or syncope.  He has had some peripheral edema.  He denies orthopnea and PND.  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 Excell Seltzer and Dr. Lynnette Caffey   Sleep apnea    STEMI (ST elevation myocardial infarction) [facility])     Past Surgical History:  Procedure Laterality Date   APPENDECTOMY     BUBBLE STUDY  10/02/2022   Procedure: BUBBLE STUDY;  Surgeon: Laurey Morale, MD;  Location: [facility] ENDOSCOPY;  Service: Cardiovascular;;   CHOLECYSTECTOMY N/A 03/02/2020   Procedure: LAPAROSCOPIC CHOLECYSTECTOMY;  Surgeon: Franky Macho, MD;  Location: [facility];  Service: General;  Laterality: N/A;   CORONARY STENT INTERVENTION N/A 04/30/2020   Procedure: CORONARY STENT INTERVENTION;  Surgeon: Swaziland, Peter M, MD;  Location: [facility] INVASIVE CV LAB;  Service: Cardiovascular;  Laterality: N/A;   CORONARY/GRAFT ACUTE MI REVASCULARIZATION N/A 04/30/2020   Procedure: Coronary/Graft Acute MI Revascularization;  Surgeon: Swaziland, Peter M, MD;  Location: [facility] INVASIVE CV LAB;  Service: Cardiovascular;  Laterality: N/A;   Eyelid Surgery Bilateral    HERNIA REPAIR     I & D EXTREMITY Left 04/06/2022   Procedure: LEFT LEG DEBRIDEMENT;  Surgeon: Nadara Mustard, MD;  Location: [facility] OR;  Service: Orthopedics;  Laterality: Left;   I & D EXTREMITY Left 04/04/2022   Procedure: LEFT LEG DEBRIDEMENT;  Surgeon: Nadara Mustard, MD;  Location: [facility] OR;  Service: Orthopedics;  Laterality: Left;   knee tendons repair     LEFT HEART CATH AND CORONARY ANGIOGRAPHY  Patient ID: Ryan Garner, male   DOB: Nov 11, 1945, 77 y.o.   MRN: [id number]  [facility]       [address]       [place] [address]             [phone]          CARDIOTHORACIC SURGERY CONSULTATION REPORT  PCP is [name], [name], MD Referring Provider is Alverda Skeans, MD Primary Cardiologist is [name], MD  Reason for consultation:  Severe aortic stenosis  HPI:  The patient is a 77 year old gentleman with a history of diabetes, hypertension, hyperlipidemia, coronary disease status post STEMI and PCI of the left circumflex in 2021, severe functional mitral regurgitation status post mitral transcatheter edge-to-edge repair with PASCAL ACE (x2) in March 2023 complicated by a rectus sheath hematoma, stage IV chronic kidney disease, chronic diastolic congestive heart failure, and paradoxical low-flow/low gradient aortic stenosis.  A 2D echo on 11/16/2022 showed a calcified aortic valve with a mean gradient of 20.7 mmHg and a peak gradient of 33.6 mmHg with a valve area of 0.8 cm by VTI.  Dimensionless index was 0.25 and stroke-volume index was low at 24.  Left ventricular ejection fraction was 50 to 55%.  There was mild to moderate residual mitral regurgitation with no evidence of mitral stenosis.  He reports feeling better since his mitral valve procedure but continues to have some exertional shortness of breath and fatigue.  He denies any dizziness or syncope.  He has had some peripheral edema.  He denies orthopnea and PND.  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 Excell Seltzer and Dr. Lynnette Caffey   Sleep apnea    STEMI (ST elevation myocardial infarction) [facility])     Past Surgical History:  Procedure Laterality Date   APPENDECTOMY     BUBBLE STUDY  10/02/2022   Procedure: BUBBLE STUDY;  Surgeon: Laurey Morale, MD;  Location: [facility] ENDOSCOPY;  Service: Cardiovascular;;   CHOLECYSTECTOMY N/A 03/02/2020   Procedure: LAPAROSCOPIC CHOLECYSTECTOMY;  Surgeon: Franky Macho, MD;  Location: [facility];  Service: General;  Laterality: N/A;   CORONARY STENT INTERVENTION N/A 04/30/2020   Procedure: CORONARY STENT INTERVENTION;  Surgeon: Swaziland, Peter M, MD;  Location: [facility] INVASIVE CV LAB;  Service: Cardiovascular;  Laterality: N/A;   CORONARY/GRAFT ACUTE MI REVASCULARIZATION N/A 04/30/2020   Procedure: Coronary/Graft Acute MI Revascularization;  Surgeon: Swaziland, Peter M, MD;  Location: [facility] INVASIVE CV LAB;  Service: Cardiovascular;  Laterality: N/A;   Eyelid Surgery Bilateral    HERNIA REPAIR     I & D EXTREMITY Left 04/06/2022   Procedure: LEFT LEG DEBRIDEMENT;  Surgeon: Nadara Mustard, MD;  Location: [facility] OR;  Service: Orthopedics;  Laterality: Left;   I & D EXTREMITY Left 04/04/2022   Procedure: LEFT LEG DEBRIDEMENT;  Surgeon: Nadara Mustard, MD;  Location: [facility] OR;  Service: Orthopedics;  Laterality: Left;   knee tendons repair     LEFT HEART CATH AND CORONARY ANGIOGRAPHY

## 2023-04-03 NOTE — Progress Notes (Signed)
Pre Surgical Assessment: 5 M Walk Test  31M=16.30ft  5 Meter Walk Test- trial 1: 8.71 seconds 5 Meter Walk Test- trial 2: 7.78 seconds 5 Meter Walk Test- trial 3: 6.80 seconds 5 Meter Walk Test Average: 7.76 seconds

## 2023-04-08 ENCOUNTER — Other Ambulatory Visit: Payer: Self-pay

## 2023-04-08 DIAGNOSIS — I350 Nonrheumatic aortic (valve) stenosis: Secondary | ICD-10-CM

## 2023-04-15 ENCOUNTER — Encounter: Payer: Self-pay | Admitting: Nurse Practitioner

## 2023-04-15 ENCOUNTER — Ambulatory Visit (INDEPENDENT_AMBULATORY_CARE_PROVIDER_SITE_OTHER): Payer: Medicare Other | Admitting: Nurse Practitioner

## 2023-04-15 VITALS — BP 127/76 | HR 78 | Temp 97.2°F | Resp 20 | Ht 68.0 in | Wt 261.0 lb

## 2023-04-15 DIAGNOSIS — J0100 Acute maxillary sinusitis, unspecified: Secondary | ICD-10-CM

## 2023-04-15 MED ORDER — AMOXICILLIN-POT CLAVULANATE 875-125 MG PO TABS: 1.0000 | ORAL_TABLET | Freq: Two times a day (BID) | ORAL | 0 refills | Status: DC

## 2023-04-15 NOTE — Patient Instructions (Addendum)

## 2023-04-15 NOTE — Progress Notes (Signed)
Subjective:    Patient ID: Ryan Garner, male    DOB: 1945/07/30, 77 y.o.   MRN: [id number]  Sinus Problem This is a new problem. The current episode started 1 to 4 weeks ago. The problem has been waxing and waning since onset. There has been no fever. His pain is at a severity of 7/10. The pain is moderate. Associated symptoms include congestion, coughing, sinus pressure and a sore throat. Pertinent negatives include no headaches. Treatments tried: claritin. The treatment provided mild relief.      Review of Systems  HENT:  Positive for congestion, sinus pressure and sore throat.   Respiratory:  Positive for cough.   Neurological:  Negative for headaches.       Objective:   Physical Exam Constitutional:      Appearance: Normal appearance. He is obese.  HENT:     Right Ear: Tympanic membrane normal.     Left Ear: Tympanic membrane normal.     Nose: Congestion and rhinorrhea present.     Right Sinus: Maxillary sinus tenderness present.     Left Sinus: Maxillary sinus tenderness present.     Mouth/Throat:     Pharynx: No oropharyngeal exudate or posterior oropharyngeal erythema.  Cardiovascular:     Rate and Rhythm: Normal rate and regular rhythm.     Heart sounds: Normal heart sounds.  Pulmonary:     Effort: Pulmonary effort is normal.     Breath sounds: Normal breath sounds.  Skin:    General: Skin is warm.  Neurological:     General: No focal deficit present.     Mental Status: He is alert and oriented to person, place, and time.  Psychiatric:        Mood and Affect: Mood normal.        Behavior: Behavior normal.     BP 127/76   Pulse 78   Temp (!) 97.2 F (36.2 C) (Temporal)   Resp 20   Ht 5\' 8"  (1.727 m)   Wt 261 lb (118.4 kg)   SpO2 99%   BMI 39.68 kg/m        Assessment & Plan:   Ryan Garner in today with chief complaint of Sinus Problem and pain in feet   1. Acute non-recurrent maxillary sinusitis 1. Take meds as prescribed 2. Use a cool  mist humidifier especially during the winter months and when heat has been humid. 3. Use saline nose sprays frequently 4. Saline irrigations of the nose can be very helpful if done frequently.  * 4X daily for 1 week*  * Use of a nettie pot can be helpful with this. Follow directions with this* 5. Drink plenty of fluids 6. Keep thermostat turn down low 7.For any cough or congestion- mucinex 8. For fever or aces or pains- take tylenol or ibuprofen appropriate for age and weight.  * for fevers greater than 101 orally you may alternate ibuprofen and tylenol every  3 hours.   Meds ordered this encounter  Medications   amoxicillin-clavulanate (AUGMENTIN) 875-125 MG tablet    Sig: Take 1 tablet by mouth 2 (two) times daily.    Dispense:  14 tablet    Refill:  0    Order Specific Question:   Supervising Provider    Answer:   Arville Care A [[id number]]        The above assessment and management plan was discussed with the patient. The patient verbalized understanding of and has agreed to the management  plan. Patient is aware to call the clinic if symptoms persist or worsen. Patient is aware when to return to the clinic for a follow-up visit. Patient educated on when it is appropriate to go to the emergency department.   Mary-Margaret Daphine Deutscher, FNP

## 2023-04-19 ENCOUNTER — Ambulatory Visit (HOSPITAL_COMMUNITY)
Admission: RE | Admit: 2023-04-19 | Discharge: 2023-04-19 | Disposition: A | Discharge status: Home / Self Care | Payer: Medicare Other | Source: Ambulatory Visit | Attending: Cardiovascular Disease | Admitting: Cardiovascular Disease

## 2023-04-19 ENCOUNTER — Other Ambulatory Visit: Payer: Self-pay

## 2023-04-19 ENCOUNTER — Encounter (HOSPITAL_COMMUNITY)
Admission: RE | Admit: 2023-04-19 | Discharge: 2023-04-19 | Disposition: A | Discharge status: Home / Self Care | Payer: Medicare Other | Source: Ambulatory Visit | Attending: Cardiovascular Disease | Admitting: Cardiovascular Disease

## 2023-04-19 DIAGNOSIS — Z01818 Encounter for other preprocedural examination: Secondary | ICD-10-CM | POA: Insufficient documentation

## 2023-04-19 DIAGNOSIS — I493 Ventricular premature depolarization: Secondary | ICD-10-CM | POA: Insufficient documentation

## 2023-04-19 DIAGNOSIS — I517 Cardiomegaly: Secondary | ICD-10-CM | POA: Diagnosis not present

## 2023-04-19 DIAGNOSIS — J90 Pleural effusion, not elsewhere classified: Secondary | ICD-10-CM | POA: Diagnosis not present

## 2023-04-19 DIAGNOSIS — I350 Nonrheumatic aortic (valve) stenosis: Secondary | ICD-10-CM | POA: Insufficient documentation

## 2023-04-19 DIAGNOSIS — Z1152 Encounter for screening for COVID-19: Secondary | ICD-10-CM | POA: Insufficient documentation

## 2023-04-19 LAB — COMPREHENSIVE METABOLIC PANEL
ALT: 24 U/L (ref 0–44)
AST: 28 U/L (ref 15–41)
Albumin: 4 g/dL (ref 3.5–5.0)
Alkaline Phosphatase: 97 U/L (ref 38–126)
Anion gap: 13 (ref 5–15)
BUN: 58 mg/dL — ABNORMAL HIGH (ref 8–23)
CO2: 25 mmol/L (ref 22–32)
Calcium: 9.9 mg/dL (ref 8.9–10.3)
Chloride: 99 mmol/L (ref 98–111)
Creatinine, Ser: 2.2 mg/dL — ABNORMAL HIGH (ref 0.61–1.24)
GFR, Estimated: 30 mL/min — ABNORMAL LOW (ref >60)
Glucose, Bld: 76 mg/dL (ref 70–99)
Potassium: 3.5 mmol/L (ref 3.5–5.1)
Sodium: 137 mmol/L (ref 135–145)
Total Bilirubin: 1.1 mg/dL (ref 0.3–1.2)
Total Protein: 8.1 g/dL (ref 6.5–8.1)

## 2023-04-19 LAB — CBC
HCT: 41.1 % (ref 39.0–52.0)
Hemoglobin: 13.3 g/dL (ref 13.0–17.0)
MCH: 30 pg (ref 26.0–34.0)
MCHC: 32.4 g/dL (ref 30.0–36.0)
MCV: 92.8 fL (ref 80.0–100.0)
Platelets: 274 10*3/uL (ref 150–400)
RBC: 4.43 MIL/uL (ref 4.22–5.81)
RDW: 13.2 % (ref 11.5–15.5)
WBC: 6.6 10*3/uL (ref 4.0–10.5)
nRBC: 0 % (ref 0.0–0.2)

## 2023-04-19 LAB — TYPE AND SCREEN
ABO/RH(D): B POS
Antibody Screen: NEGATIVE

## 2023-04-19 LAB — PROTIME-INR
INR: 1.7 — ABNORMAL HIGH (ref 0.8–1.2)
Prothrombin Time: 19.8 s — ABNORMAL HIGH (ref 11.4–15.2)

## 2023-04-19 LAB — URINALYSIS, ROUTINE W REFLEX MICROSCOPIC
Bilirubin Urine: NEGATIVE
Glucose, UA: 150 mg/dL — AB
Hgb urine dipstick: NEGATIVE
Ketones, ur: NEGATIVE mg/dL
Leukocytes,Ua: NEGATIVE
Nitrite: NEGATIVE
Protein, ur: NEGATIVE mg/dL
Specific Gravity, Urine: 1.008 (ref 1.005–1.030)
pH: 6 (ref 5.0–8.0)

## 2023-04-19 LAB — SURGICAL PCR SCREEN
MRSA, PCR: NEGATIVE
Staphylococcus aureus: NEGATIVE

## 2023-04-19 LAB — SARS CORONAVIRUS 2 (TAT 6-24 HRS): SARS Coronavirus 2: NEGATIVE

## 2023-04-19 NOTE — Progress Notes (Signed)
Patient signed all consents at PAT lab appointment. CHG soap and instructions were given to patient. CHG surgical prep reviewed with patient and all questions answered.  Patients chart send to anesthesia for review.  

## 2023-04-22 MED ORDER — POTASSIUM CHLORIDE 2 MEQ/ML IV SOLN
80.0000 meq | INTRAVENOUS | Status: DC
  Filled 2023-04-22 (×2): qty 40

## 2023-04-22 MED ORDER — NOREPINEPHRINE 4 MG/250ML-% IV SOLN
0.0000 ug/min | INTRAVENOUS | Status: AC
  Administered 2023-04-23: 5 ug/min via INTRAVENOUS
  Filled 2023-04-22: qty 250

## 2023-04-22 MED ORDER — MAGNESIUM SULFATE 50 % IJ SOLN
40.0000 meq | INTRAMUSCULAR | Status: DC
  Filled 2023-04-22 (×2): qty 9.85

## 2023-04-22 MED ORDER — HEPARIN 30,000 UNITS/1000 ML (OHS) CELLSAVER SOLUTION
Status: DC
  Filled 2023-04-22 (×2): qty 1000

## 2023-04-22 MED ORDER — CEFAZOLIN SODIUM-DEXTROSE 2-4 GM/100ML-% IV SOLN
2.0000 g | INTRAVENOUS | Status: AC
  Administered 2023-04-23: 2 g via INTRAVENOUS
  Filled 2023-04-22 (×2): qty 100

## 2023-04-22 MED ORDER — DEXMEDETOMIDINE HCL IN NACL 400 MCG/100ML IV SOLN
0.1000 ug/kg/h | INTRAVENOUS | Status: DC
  Filled 2023-04-22: qty 100

## 2023-04-22 NOTE — H&P (Signed)
[address]       [place] [address]             [phone]      Cardiothoracic Surgery Admission History and Physical   PCP is [name], [name], MD Referring Provider is Alverda Skeans, MD Primary Cardiologist is [name], MD   Reason for admission:  Severe aortic stenosis   HPI:   The patient is a 77 year old gentleman with a history of diabetes, hypertension, hyperlipidemia, coronary disease status post STEMI and PCI of the left circumflex in 2021, severe functional mitral regurgitation status post mitral transcatheter edge-to-edge repair with PASCAL ACE (x2) in March 2023 complicated by a rectus sheath hematoma, stage IV chronic kidney disease, chronic diastolic congestive heart failure, and paradoxical low-flow/low gradient aortic stenosis.  A 2D echo on 11/16/2022 showed a calcified aortic valve with a mean gradient of 20.7 mmHg and a peak gradient of 33.6 mmHg with a valve area of 0.8 cm by VTI.  Dimensionless index was 0.25 and stroke-volume index was low at 24.  Left ventricular ejection fraction was 50 to 55%.  There was mild to moderate residual mitral regurgitation with no evidence of mitral stenosis.   He reports feeling better since his mitral valve procedure but continues to have some exertional shortness of breath and fatigue.  He denies any dizziness or syncope.  He has had some peripheral edema.  He denies orthopnea and PND.       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 Excell Seltzer and Dr. Lynnette Caffey   Sleep apnea     STEMI (ST elevation  myocardial infarction) [facility])                 Past Surgical History:  Procedure Laterality Date   APPENDECTOMY       BUBBLE STUDY   10/02/2022    Procedure: BUBBLE STUDY;  Surgeon: Laurey Morale, MD;  Location: [facility] ENDOSCOPY;  Service: Cardiovascular;;   CHOLECYSTECTOMY N/A 03/02/2020    Procedure: LAPAROSCOPIC CHOLECYSTECTOMY;  Surgeon: Franky Macho, MD;  Location: [facility] ORS;  Service: General;  Laterality: N/A;   CORONARY STENT INTERVENTION N/A 04/30/2020    Procedure: CORONARY STENT INTERVENTION;  Surgeon: Swaziland, Peter M, MD;  Location: [facility] INVASIVE CV LAB;  Service: Cardiovascular;  Laterality: N/A;   CORONARY/GRAFT ACUTE MI REVASCULARIZATION N/A 04/30/2020    Procedure: Coronary/Graft Acute MI Revascularization;  Surgeon: Swaziland, Peter M, MD;  Location: [facility] INVASIVE CV LAB;  Service: Cardiovascular;  Laterality: N/A;   Eyelid Surgery Bilateral     HERNIA REPAIR       I & D EXTREMITY Left 04/06/2022    Procedure: LEFT LEG DEBRIDEMENT;  Surgeon: Nadara Mustard, MD;  Location: [facility] OR;  Service: Orthopedics;  Laterality: Left;   I & D EXTREMITY Left 04/04/2022    Procedure: LEFT LEG DEBRIDEMENT;  Surgeon: Nadara Mustard, MD;  Location: [facility] OR;  Service: Orthopedics;  Laterality: Left;  knee tendons repair       LEFT HEART CATH AND CORONARY ANGIOGRAPHY N/A 04/30/2020    Procedure: LEFT HEART CATH AND CORONARY ANGIOGRAPHY;  Surgeon: Swaziland, Peter M, MD;  Location: [facility] INVASIVE CV LAB;  Service: Cardiovascular;  Laterality: N/A;   MITRAL VALVE REPAIR N/A 10/10/2022    Procedure: MITRAL VALVE REPAIR;  Surgeon: Tonny Bollman, MD;  Location: [facility] INVASIVE CV LAB;  Service: Cardiovascular;  Laterality: N/A;   RIGHT HEART CATH N/A 10/03/2022    Procedure: RIGHT HEART CATH;  Surgeon: Laurey Morale, MD;  Location: [facility] INVASIVE CV LAB;  Service: Cardiovascular;  Laterality: N/A;   RIGHT HEART CATH N/A 12/26/2022    Procedure: RIGHT HEART CATH;  Surgeon: Laurey Morale, MD;  Location: [facility] INVASIVE CV LAB;   Service: Cardiovascular;  Laterality: N/A;   RIGHT/LEFT HEART CATH AND CORONARY ANGIOGRAPHY N/A 02/05/2023    Procedure: RIGHT/LEFT HEART CATH AND CORONARY ANGIOGRAPHY;  Surgeon: Laurey Morale, MD;  Location: [facility] INVASIVE CV LAB;  Service: Cardiovascular;  Laterality: N/A;   ROTATOR CUFF REPAIR Bilateral     TEE WITHOUT CARDIOVERSION N/A 10/02/2022    Procedure: TRANSESOPHAGEAL ECHOCARDIOGRAM (TEE);  Surgeon: Laurey Morale, MD;  Location: [facility] ENDOSCOPY;  Service: Cardiovascular;  Laterality: N/A;   TEE WITHOUT CARDIOVERSION N/A 10/10/2022    Procedure: TRANSESOPHAGEAL ECHOCARDIOGRAM;  Surgeon: Tonny Bollman, MD;  Location: [facility] INVASIVE CV LAB;  Service: Cardiovascular;  Laterality: N/A;   TEE WITHOUT CARDIOVERSION N/A 12/26/2022    Procedure: TRANSESOPHAGEAL ECHOCARDIOGRAM;  Surgeon: Laurey Morale, MD;  Location: [facility] INVASIVE CV LAB;  Service: Cardiovascular;  Laterality: N/A;   TONSILLECTOMY                   Family History  Problem Relation Age of Onset   Cancer Mother          lungs   Coronary artery disease Mother     Diabetes Brother     Heart attack Brother     Diabetes Maternal Grandmother     Arthritis Father            Social History         Socioeconomic History   Marital status: Widowed      Spouse name: Karena Addison   Number of children: 2   Years of education: 20   Highest education level: Bachelor's degree (e.g., BA, AB, BS)  Occupational History   Occupation: Education officer, environmental      Comment: 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, environmental  Tobacco Use   Smoking status: Former      Current packs/day: 0.00      Types: Cigarettes      Quit date: 04/02/1959      Years since quitting: 64.0   Smokeless tobacco: Never  Vaping Use   Vaping status: Never Used  Substance and Sexual Activity   Alcohol use: No   Drug use: No   Sexual activity: Yes  Other Topics Concern   Not on file  Social History Narrative   Not on file    Social  Determinants of Health        Financial Resource Strain: Low Risk  (10/19/2022)    Overall Financial Resource Strain (CARDIA)     Difficulty of Paying Living Expenses: Not hard at all  Food Insecurity: No Food Insecurity (10/19/2022)    Hunger Vital Sign     Worried About Running Out of Food in the  Last Year: Never true     Ran Out of Food in the Last Year: Never true  Transportation Needs: No Transportation Needs (10/19/2022)    PRAPARE - Therapist, art (Medical): No     Lack of Transportation (Non-Medical): No  Physical Activity: Inactive (10/14/2022)    Exercise Vital Sign     Days of Exercise per Week: 0 days     Minutes of Exercise per Session: 30 min  Stress: No Stress Concern Present (10/19/2022)    [name]     Feeling of Stress : Not at all  Social Connections: Moderately Integrated (10/14/2022)    Social Connection and Isolation Panel [NHANES]     Frequency of Communication with Friends and Family: More than three times a week     Frequency of Social Gatherings with Friends and Family: Once a week     Attends Religious Services: More than 4 times per year     Active Member of [employer] or Organizations: Yes     Attends Banker Meetings: More than 4 times per year     Marital Status: Widowed  Intimate Partner Violence: Not At Risk (10/07/2022)    Humiliation, Afraid, Rape, and Kick questionnaire     Fear of Current or Ex-Partner: No     Emotionally Abused: No     Physically Abused: No     Sexually Abused: No             Prior to Admission medications   Medication Sig Start Date End Date Taking? Authorizing Provider  apixaban (ELIQUIS) 5 MG TABS tablet Take 1 tablet (5 mg total) by mouth 2 (two) times daily. 02/06/23   Yes Laurey Morale, MD  ascorbic acid (VITAMIN C) 1000 MG tablet Take 1 tablet (1,000 mg total) by mouth daily. 04/14/22   Yes Adonis Huguenin, NP   atorvastatin (LIPITOR) 80 MG tablet Take 1 tablet by mouth once daily 11/08/22   Yes Pricilla Riffle, MD  cetirizine (ZYRTEC) 10 MG tablet Take 10 mg by mouth daily.     Yes [provider]  dapagliflozin propanediol (FARXIGA) 10 MG TABS tablet Take 10 mg by mouth in the morning.     Yes [provider]  midodrine (PROAMATINE) 2.5 MG tablet Take 1 tablet (2.5 mg total) by mouth 3 (three) times daily with meals. 10/11/22   Yes Clegg, Amy D, NP  Multiple Vitamin (MULTIVITAMIN WITH MINERALS) TABS tablet Take 1 tablet by mouth daily.     Yes [provider]  neomycin-bacitracin-polymyxin [phone] OINT Apply 1 Application topically 3 (three) times daily as needed (wounds/bruises).     Yes [provider]  nitroGLYCERIN (NITROSTAT) 0.4 MG SL tablet Place 1 tablet (0.4 mg total) under the tongue every 5 (five) minutes x 3 doses as needed for chest pain. 04/26/22   Yes Bhagat, Bhavinkumar, PA  potassium chloride SA (KLOR-CON M20) 20 MEQ tablet Take 1 tablet (20 mEq total) by mouth daily. 12/21/22   Yes Laurey Morale, MD  Tiotropium Bromide Monohydrate (SPIRIVA RESPIMAT) 1.25 MCG/ACT AERS Inhale 2 puffs into the lungs daily. Patient taking differently: Inhale 2 puffs into the lungs daily as needed (respiratory issues.). 03/05/22   Yes Emokpae, Courage, MD  torsemide (DEMADEX) 20 MG tablet Take 3 tablets (60 mg total) by mouth 2 (two) times daily. 12/21/22   Yes Laurey Morale, MD  Current Outpatient Medications  Medication Sig Dispense Refill   apixaban (ELIQUIS) 5 MG TABS tablet Take 1 tablet (5 mg total) by mouth 2 (two) times daily. 180 tablet 1   ascorbic acid (VITAMIN C) 1000 MG tablet Take 1 tablet (1,000 mg total) by mouth daily. 60 tablet 1   atorvastatin (LIPITOR) 80 MG tablet Take 1 tablet by mouth once daily 90 tablet 2   cetirizine (ZYRTEC) 10 MG tablet Take 10 mg by mouth daily.       dapagliflozin propanediol (FARXIGA) 10 MG TABS tablet Take  10 mg by mouth in the morning.       midodrine (PROAMATINE) 2.5 MG tablet Take 1 tablet (2.5 mg total) by mouth 3 (three) times daily with meals. 90 tablet 6   Multiple Vitamin (MULTIVITAMIN WITH MINERALS) TABS tablet Take 1 tablet by mouth daily.       neomycin-bacitracin-polymyxin [phone] OINT Apply 1 Application topically 3 (three) times daily as needed (wounds/bruises).       nitroGLYCERIN (NITROSTAT) 0.4 MG SL tablet Place 1 tablet (0.4 mg total) under the tongue every 5 (five) minutes x 3 doses as needed for chest pain. 25 tablet 2   potassium chloride SA (KLOR-CON M20) 20 MEQ tablet Take 1 tablet (20 mEq total) by mouth daily. 120 tablet 5   Tiotropium Bromide Monohydrate (SPIRIVA RESPIMAT) 1.25 MCG/ACT AERS Inhale 2 puffs into the lungs daily. (Patient taking differently: Inhale 2 puffs into the lungs daily as needed (respiratory issues.).) 4 g 3   torsemide (DEMADEX) 20 MG tablet Take 3 tablets (60 mg total) by mouth 2 (two) times daily. 240 tablet 6      No current facility-administered medications for this visit.        Allergies       Allergies  Allergen Reactions   Flecainide Other (See Comments)      dizziness, shortness of breath, blurred vision   Metoprolol Tartrate Itching   Rofecoxib Rash and Other (See Comments)      (Vioxx)            Review of Systems:               General:                      normal appetite, + decreased energy, no weight gain, no weight loss, no fever             Cardiac:                       no chest pain with exertion, no chest pain at rest, + SOB with moderate exertion, no resting SOB, no PND, no orthopnea, no palpitations, no arrhythmia, no atrial fibrillation, + LE edema, no dizzy spells, no syncope             Respiratory:                 + exertional shortness of breath, no home oxygen, no productive cough, no dry cough, no bronchitis, no wheezing, no hemoptysis, no asthma, no pain with inspiration or cough, no sleep apnea, no  CPAP at night             GI:                               no difficulty swallowing, no reflux, no frequent heartburn, no hiatal  hernia, no abdominal pain, no constipation, no diarrhea, no hematochezia, no hematemesis, no melena             GU:                              no dysuria,  no frequency, no urinary tract infection, no hematuria, no enlarged prostate, no kidney stones, + chronic kidney disease             Vascular:                     no pain suggestive of claudication, no pain in feet, no leg cramps, no varicose veins, no DVT, no non-healing foot ulcer             Neuro:                         no stroke, no TIA's, no seizures, no headaches, no temporary blindness one eye,  no slurred speech, no peripheral neuropathy, no chronic pain, no instability of gait, no memory/cognitive dysfunction             Musculoskeletal:         no arthritis, no joint swelling, no myalgias, no difficulty walking, normal mobility              Skin:                            no rash, no itching, no skin infections, no pressure sores or ulcerations             Psych:                         no anxiety, no depression, no nervousness, no unusual recent stress             Eyes:                           no blurry vision, + floaters, no recent vision changes, no  glasses or contacts             ENT:                            no hearing loss, no loose or painful teeth, no dentures, last saw dentist 2023             Hematologic:               no easy bruising, no abnormal bleeding, no clotting disorder, no frequent epistaxis             Endocrine:                   no diabetes, does not check CBG's at home                            Physical Exam:               Ht 5\' 8"  (1.727 m)   BMI 34.97 kg/m              General:  well-appearing             HEENT:                       Unremarkable, NCAT, PERLA, EOMI             Neck:                           no JVD, no bruits, no adenopathy               Chest:                          clear to auscultation, symmetrical breath sounds, no wheezes, no rhonchi              CV:                              RRR, 3/6 systolic murmur RSB, no diastolic murmur             Abdomen:                    soft, non-tender, no masses              Extremities:                 warm, well-perfused, pedal pulses palpable, no lower extremity edema             Rectal/GU                   Deferred             Neuro:                         Grossly non-focal and symmetrical throughout             Skin:                            Clean and dry, no rashes, no breakdown   Diagnostic Tests:   ECHOCARDIOGRAM REPORT       Patient Name:   [name] Date of Exam: 11/16/2022  Medical Rec #:  [id number]      Height:       68.0 in  Accession #:    [id number]     Weight:       221.0 lb  Date of Birth:  July 18, 1946       BSA:          2.132 m  Patient Age:    76 years       BP:           125/79 mmHg  Patient Gender: M              HR:           83 bpm.  Exam Location:  [address]   Procedure: 2D Echo, Cardiac Doppler and Color Doppler   Indications:    Z98.890, Z95.818 S/P mitral valve clip implantation    History:        Patient has prior history of Echocardiogram examinations,  most                 recent 10/11/2022. CHF, CAD and Previous Myocardial  Infarction,                 COPD, Arrythmias:Atrial Fibrillation; Risk  Factors:Hypertension,                 Diabetes and Dyslipidemia. Severe MR. Ascending aortic  aneurysm.                 Bradycardia. Aortic stenosis. Morbid obesity. Secondary                  pulmonary hypertension. Transient hypotension.    Sonographer:    Cathie Beams RCS  Referring Phys: Deri Fuelling MCDANIEL   IMPRESSIONS     1. S/P mitraclip x 2 (A2-P2, A3-P3) with mild to moderate MR and mean  gradient 6 mmHg (HR 84). Calcified aortic valve with severe AS (mean  gradient 21 mmHg; AVA 0.8 cm2; DI 0.25).   2. Left ventricular  ejection fraction, by estimation, is 50 to 55%. The  left ventricle has low normal function. The left ventricle has no regional  wall motion abnormalities. The left ventricular internal cavity size was  mildly dilated. There is mild  left ventricular hypertrophy. Left ventricular diastolic function could  not be evaluated. There is the interventricular septum is flattened in  systole and diastole, consistent with right ventricular pressure and  volume overload.   3. Right ventricular systolic function is moderately reduced. The right  ventricular size is moderately enlarged.   4. Left atrial size was severely dilated.   5. Right atrial size was severely dilated.   6. The mitral valve is normal in structure. Mild to moderate mitral valve  regurgitation. No evidence of mitral stenosis. Procedure Date: 10/10/22.   7. The aortic valve is calcified. Aortic valve regurgitation is not  visualized. Severe aortic valve stenosis.   8. The inferior vena cava is dilated in size with >50% respiratory  variability, suggesting right atrial pressure of 8 mmHg.   9. 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   Comparison(s): No significant change from prior study.   FINDINGS   Left Ventricle: Left ventricular ejection fraction, by estimation, is 50  to 55%. The left ventricle has low normal function. The left ventricle has  no regional wall motion abnormalities. The left ventricular internal  cavity size was mildly dilated.  There is mild left ventricular hypertrophy. The interventricular septum is  flattened in systole and diastole, consistent with right ventricular  pressure and volume overload. Left ventricular diastolic function could  not be evaluated due to atrial  fibrillation. Left ventricular diastolic function could not be evaluated.   Right Ventricle: The right ventricular size is moderately enlarged. Right   ventricular systolic function is moderately reduced.   Left Atrium: Left atrial size was severely dilated.   Right Atrium: Right atrial size was severely dilated.   Pericardium: Trivial pericardial effusion is present.   Mitral Valve: The mitral valve is normal in structure. Mild to moderate  mitral valve regurgitation. No evidence of mitral valve stenosis. MV peak  gradient, 17.0 mmHg. The mean mitral valve gradient is 5.8 mmHg.   Tricuspid Valve: The tricuspid valve is normal in structure. Tricuspid  valve regurgitation is mild . No evidence of tricuspid stenosis.   Aortic Valve: The aortic valve is calcified. Aortic valve regurgitation is  not visualized. Severe aortic stenosis is present. Aortic valve mean  gradient measures 20.7 mmHg. Aortic valve peak gradient measures 33.6  mmHg. Aortic valve area, by VTI measures   0.80 cm.   Pulmonic Valve: The pulmonic valve was normal in structure. Pulmonic valve  regurgitation is not visualized. No evidence of pulmonic stenosis.   Aorta: The aortic root is normal in size and structure.   Venous: The inferior vena cava is dilated in size with greater than 50%  respiratory variability, suggesting right atrial pressure of 8 mmHg.   IAS/Shunts: No atrial level shunt detected by color flow Doppler. There is  a small secundum atrial septal defect with predominantly left to right  shunting across the atrial septum.   Additional Comments: S/P mitraclip x 2 (A2-P2, A3-P3) with mild to  moderate MR and mean gradient 6 mmHg (HR 84). Calcified aortic valve with  severe AS (mean gradient 21 mmHg; AVA 0.8 cm2; DI 0.25).     LEFT VENTRICLE  PLAX 2D  LVIDd:         5.40 cm  LVIDs:         4.20 cm  LV PW:         1.30 cm  LV IVS:        1.20 cm  LVOT diam:     2.00 cm  LV SV:         51  LV SV Index:   24  LVOT Area:     3.14 cm     RIGHT VENTRICLE  RV Basal diam:  4.40 cm  TAPSE (M-mode): 1.5 cm  RVSP:           48.7 mmHg   LEFT  ATRIUM           Index        RIGHT ATRIUM           Index  LA diam:      6.10 cm 2.86 cm/m   RA Pressure: 3.00 mmHg  LA Vol (A4C): 81.4 ml 38.17 ml/m  RA Area:     30.20 cm                                     RA Volume:   108.00 ml 50.65 ml/m   AORTIC VALVE  AV Area (Vmax):    0.84 cm  AV Area (Vmean):   0.77 cm  AV Area (VTI):     0.80 cm  AV Vmax:           289.67 cm/s  AV Vmean:          217.000 cm/s  AV VTI:            0.639 m  AV Peak Grad:      33.6 mmHg  AV Mean Grad:      20.7 mmHg  LVOT Vmax:         77.33 cm/s  LVOT Vmean:        53.133 cm/s  LVOT VTI:          0.162 m  LVOT/AV VTI ratio: 0.25    AORTA  Ao Root diam: 3.70 cm  Ao Asc diam:  3.70 cm   MITRAL VALVE               TRICUSPID VALVE  MV Area (PHT): 3.67 cm    TR Peak grad:   45.7 mmHg  MV Area VTI:   1.04 cm    TR Vmax:        338.00  cm/s  MV Peak grad:  17.0 mmHg   Estimated RAP:  3.00 mmHg  MV Mean grad:  5.8 mmHg    RVSP:           48.7 mmHg  MV Vmax:       2.06 m/s  MV Vmean:      104.5 cm/s  SHUNTS  MR Peak grad: 78.1 mmHg    Systemic VTI:  0.16 m  MR Mean grad: 53.5 mmHg    Systemic Diam: 2.00 cm  MR Vmax:      441.75 cm/s  MR Vmean:     349.0 cm/s   Olga Millers MD  Electronically signed by Olga Millers MD  Signature Date/Time: 11/16/2022/12:32:13 PM        Final      Physicians   Panel Physicians Referring Physician Case Authorizing Physician  Laurey Morale, MD (Primary)        Procedures   RIGHT/LEFT HEART CATH AND CORONARY ANGIOGRAPHY    Conclusion       Mid RCA lesion is 30% stenosed.   Ramus lesion is 40% stenosed.   1st Diag lesion is 60% stenosed.   1st Mrg lesion is 70% stenosed.   Non-stenotic Mid Cx lesion was previously treated.   1. Normal RA pressure.  2. Mixed pulmonary venous/pulmonary arterial hypertension. Suspect patient has had pulmonary vascular remodeling with long-standing MR leading to a component of pulmonary arterial hypertension.  3.  Mildly elevated PCWP with prominent v-waves consistent with known moderate residual MR post-Mitraclips.  4. Severe aortic stenosis by imaging with mean gradient 30 mmHg on this catheterization.  The aortic valve was surprisingly easy to cross.  5. Moderate stenosis in the D1 and OM1 with patient LCX stent.  No interventional target.    Continue TAVR evaluation.    Surgeon Notes       12/26/2022 11:39 AM CV Procedure signed by Laurey Morale, MD    Procedural Details   Technical Details Procedure: Right Heart Cath, Left Heart Cath, Selective Coronary Angiography  Indication: Aortic stenosis, CHF   Procedural Details: The right brachial and radial areas were prepped, draped, and anesthetized with 1% lidocaine. There was a pre-existing peripheral IV in the right brachial area that was replaced with a 38F venous sheath. A Swan-Ganz catheter was used for the right heart catheterization. Standard protocol was followed for recording of right heart pressures and sampling of oxygen saturations. Fick cardiac output was calculated. The right radial artery was entered using modified Seldinger technique and a 12F sheath was placed.  The patient received 3 mg IA verapamil and weight-based IV heparin.  Standard Judkins catheters were used for selective coronary angiography. There were no immediate procedural complications. The patient was transferred to the post catheterization recovery area for further monitoring.  35-40 cc contrast used     Estimated blood loss <50 mL.   During this procedure medications were administered to achieve and maintain moderate conscious sedation while the patient's heart rate, blood pressure, and oxygen saturation were continuously monitored and I was present face-to-face 100% of this time.    Medications (Filter: Administrations occurring from 0924 to 1021 on 02/05/23)  important  Continuous medications are totaled by the amount administered until 02/05/23 1021.     fentaNYL (SUBLIMAZE) injection (mcg)  Total dose: 25 mcg Date/Time Rate/Dose/Volume Action    02/05/23 0953 25 mcg Given    midazolam (VERSED) injection (mg)  Total dose: 1 mg Date/Time Rate/Dose/Volume Action  02/05/23 0953 0.5 mg Given    1002 0.5 mg Given    lidocaine (PF) (XYLOCAINE) 1 % injection (mL)  Total volume: 4 mL Date/Time Rate/Dose/Volume Action    02/05/23 0956 2 mL Given    1002 2 mL Given    Radial Cocktail/Verapamil only (mL)  Total volume: 10 mL Date/Time Rate/Dose/Volume Action    02/05/23 1004 10 mL Given    heparin sodium (porcine) injection (Units)  Total dose: 5,000 Units Date/Time Rate/Dose/Volume Action    02/05/23 1006 5,000 Units Given    Heparin (Porcine) in NaCl 1000-0.9 UT/500ML-% SOLN (mL)  Total volume: 1,000 mL Date/Time Rate/Dose/Volume Action    02/05/23 1015 500 mL Given    1015 500 mL Given    iohexol (OMNIPAQUE) 350 MG/ML injection (mL)  Total volume: 45 mL Date/Time Rate/Dose/Volume Action    02/05/23 1015 45 mL Given      Sedation Time   Sedation Time Physician-1: 20 minutes 54 seconds Contrast        Administrations occurring from 0924 to 1021 on 02/05/23:  Medication Name Total Dose  iohexol (OMNIPAQUE) 350 MG/ML injection 45 mL    Radiation/Fluoro   Fluoro time: 4 (min) DAP: 15143 (mGycm2) Cumulative Air Kerma: 293 (mGy) Complications   Complications documented before study signed (02/05/2023 10:29 AM)    No complications were associated with this study.  Documented by Elmon Else, RT - 02/05/2023 10:15 AM      Coronary Findings   Diagnostic Dominance: Right Left Main  Vessel is normal in caliber. Vessel is angiographically normal.    Left Anterior Descending  Vessel is large. There is mild diffuse disease throughout the vessel.    First Diagonal Branch  1st Diag lesion is 60% stenosed.    Ramus Intermedius  Ramus lesion is 40% stenosed.    Left Circumflex  Non-stenotic Mid Cx lesion was  previously treated.    First Obtuse Marginal Branch  1st Mrg lesion is 70% stenosed.    Right Coronary Artery  Vessel is normal in caliber. There is mild diffuse disease throughout the vessel.  Mid RCA lesion is 30% stenosed.    Intervention    No interventions have been documented.    Right Heart   Right Heart Pressures RHC Procedural Findings: Hemodynamics (mmHg) RA mean 8 RV 77/5 PA 73/15, mean 43 PCWP mean 16, v-waves to 29 LV 115/8 AO 84/48  Oxygen saturations: PA 69% AO 99%  Cardiac Output (Fick) 6.73  Cardiac Index (Fick) 3.1 PVR 4 WU  Aortic valve mean gradient 30 mmHg, peak-to-peak 32 mmHg    Coronary Diagrams   Diagnostic Dominance: Right  Intervention    Implants    No implant documentation for this case.    Syngo Images    Show images for CARDIAC CATHETERIZATION Images on Long Term Storage    Show images for Lakendric, [place] to Procedure Log   Procedure Log    Link to Procedure Log   Procedure Log    Hemo Data   Flowsheet Row Most Recent Value  Fick Cardiac Output 6.73 L/min  Fick Cardiac Output Index 3.1 (L/min)/BSA  Aortic Mean Gradient 30.15 mmHg  Aortic Peak Gradient 31.9 mmHg  Aortic Valve Area 1.26  Aortic Value Area Index 0.58 cm2/BSA  RA A Wave 11 mmHg  RA V Wave 10 mmHg  RA Mean 8 mmHg  RV Systolic Pressure 77 mmHg  RV Diastolic Pressure 1 mmHg  RV EDP 5 mmHg  PA Systolic  Pressure 73 mmHg  PA Diastolic Pressure 15 mmHg  PA Mean 43 mmHg  PW A Wave 14 mmHg  PW V Wave 29 mmHg  PW Mean 16 mmHg  AO Systolic Pressure 91 mmHg  AO Diastolic Pressure 52 mmHg  AO Mean 68 mmHg  LV Systolic Pressure 112 mmHg  LV Diastolic Pressure -1 mmHg  LV EDP 8 mmHg  AOp Systolic Pressure 84 mmHg  AOp Diastolic Pressure 48 mmHg  AOp Mean Pressure 62 mmHg  LVp Systolic Pressure 115 mmHg  LVp Diastolic Pressure -1 mmHg  LVp EDP Pressure 8 mmHg  QP/QS 1  TPVR Index 13.85 HRUI  TSVR Index 21.9 HRUI  PVR SVR Ratio 0.45   TPVR/TSVR Ratio 0.63      Narrative & Impression CLINICAL DATA:  Aortic Valve pathology with assessment for TAVR   EXAM: Cardiac TAVR CT   TECHNIQUE: The patient was scanned on a Siemens Force 192 slice scanner. A 120 kV retrospective scan was triggered in the descending thoracic aorta at 111 HU's. Gantry rotation speed was 270 msecs and collimation was .9 mm. No beta blockade or nitro were given. The 3D data set was reconstructed in 5% intervals of the R-R cycle. Systolic and diastolic phases were analyzed on a dedicated work station using MPR, MIP and VRT modes. The patient received 100 cc of contrast.   FINDINGS: Aortic Valve: Severely thickened tri-leaflet aortic valve with heavy calcification and reduced excursion the planimeter valve area is 1.31 Sq cm consistent with moderate to severe aortic stenosis   Number of leaflets: Three   LVOT calcification: None   Annular calcification: Mild, under right coronary cusp   Aortic Valve Calcium Score: 2276   Presence of basal septal hypertrophy:Non-severe   Perimembranous septal diameter: 6 mm   Mitral Valve: There are two mTEER devices medial and lateral to the A2-P2 Position. There is no evidence of leaflet detachment. At best estimate, multi-planar triple outlet area 4.71 cm2.   Aortic Annulus Measurements- 20%   Major annulus diameter: 30 mm   Minor annulus diameter:25 mm   Annular perimeter: 86 mm   Annular area: 5.76 cm2   Aortic Root Measurements- 70%   Sinotubular Junction: 34 mm   Ascending Thoracic Aorta: 44 mm   Aortic Arch: 35 mm   Descending Thoracic Aorta: 27 mm   Aortic atherosclerosis.   Sinus of Valsalva Measurements:   Right coronary cusp width: 34 mm   Left coronary cusp width: 35 mm   Non coronary cusp width: 35 mm   Coronary Artery Height above Annulus:   Left Main: 12 mm   Left SoV height: 19 mm   Right Coronary: 16 mm   Right SoV height: 20 mm   Optimum Fluoroscopic  Angle for Delivery: LAO 4, CRA 16   Cusp overlay view angle:RAO 0, CRA 7   Valves for structural team consideration: 29 mm Sapien Valve favored, 34 mm Evolut Valve is feasible   Non TAVR Valve Findings:   Coronary Arteries: Normal coronary origin. Study not completed with nitroglycerin. Known coronary disease; calcium scored deferred   Systemic veins: Normal anatomy.   Main Pulmonary artery: Dilation of the main pulmonary artery: moderate at 33 mm   This can be associated with the presence of pulmonary hypertension; clinical correlation advised.   Pulmonary veins: Normal anatomy   Left atrial appendage: Patent   Interatrial septum: Iatrogenic ASD has resolved.   Left ventricle: Grossly normal size   Left atrium: Dilated   Right  ventricle: Dilated   Right atrium: Dilated   Pericardium: No calcifications.   Extra Cardiac Findings as per separate reporting.   Notable artifacts: Mild motion artifact in systole and diastole   IMPRESSION: 1. Moderate to severe aortic stenosis. Findings pertinent to TAVR procedure are detailed above.   RECOMMENDATIONS:   The proposed cut-off value of 1,651 AU yielded a 93 % sensitivity and 75 % specificity in grading AS severity in patients with classical low-flow, low-gradient AS. Proposed different cut-off values to define severe AS for men and women as 2,065 AU and 1,274 AU, respectively. The joint European and American recommendations for the assessment of AS consider the aortic valve calcium score as a continuum - a very high calcium score suggests severe AS and a low calcium score suggests severe AS is unlikely.   Sunday Shams, et al. 2017 ESC/EACTS Guidelines for the management of valvular heart disease. Eur Heart J [phone]   Coronary artery calcium (CAC) score is a strong predictor of incident coronary heart disease (CHD) and provides predictive information beyond traditional risk factors. CAC  scoring is reasonable to use in the decision to withhold, postpone, or initiate statin therapy in intermediate-risk or selected borderline-risk asymptomatic adults (age 42-75 years and LDL-C >=70 to <190 mg/dL) who do not have diabetes or established atherosclerotic cardiovascular disease (ASCVD).* In intermediate-risk (10-year ASCVD risk >=7.5% to <20%) adults or selected borderline-risk (10-year ASCVD risk >=5% to <7.5%) adults in whom a CAC score is measured for the purpose of making a treatment decision the following recommendations have been made:   If CAC = 0, it is reasonable to withhold statin therapy and reassess in 5 to 10 years, as long as higher risk conditions are absent (diabetes mellitus, family history of premature CHD in first degree relatives (males <55 years; females <65 years), cigarette smoking, LDL >=190 mg/dL or other independent risk factors).   If CAC is 1 to 99, it is reasonable to initiate statin therapy for patients >=65 years of age.   If CAC is >=100 or >=75th percentile, it is reasonable to initiate statin therapy at any age.   Cardiology referral should be considered for patients with CAC scores >=400 or >=75th percentile.   *2018 AHA/ACC/AACVPR/AAPA/ABC/ACPM/ADA/AGS/APhA/ASPC/NLA/PCNA Guideline on the Management of Blood Cholesterol: A Report of the American College of Cardiology/American Heart Association Task Force on Clinical Practice Guidelines. J Am Coll Cardiol. 2019;73(24):3168-3209.   Mahesh  Chandrasekhar   Electronically Signed: By: Riley Lam M.D. On: 03/06/2023 12:51     Narrative & Impression  CLINICAL DATA:  Preop evaluation for aortic valve replacement   EXAM: CT ANGIOGRAPHY CHEST, ABDOMEN AND PELVIS   TECHNIQUE: Non-contrast CT of the chest was initially obtained.   Multidetector CT imaging through the chest, abdomen and pelvis was performed using the standard protocol during bolus administration  of intravenous contrast. Multiplanar reconstructed images and MIPs were obtained and reviewed to evaluate the vascular anatomy.   RADIATION DOSE REDUCTION: This exam was performed according to the departmental dose-optimization program which includes automated exposure control, adjustment of the mA and/or kV according to patient size and/or use of iterative reconstruction technique.   CONTRAST:  OMNIPAQUE IOHEXOL 350 MG/ML SOLN   COMPARISON:  Chest CT dated January 01, 2022   FINDINGS: CTA CHEST FINDINGS   Cardiovascular: Cardiomegaly. No pericardial effusion. Normal caliber thoracic aorta with mild atherosclerotic disease. Aortic valve thickening and calcifications. Left main and three-vessel coronary artery calcifications.   Mediastinum/Nodes:  Esophagus and thyroid are unremarkable. No enlarged lymph nodes seen in the chest.   Lungs/Pleura: Bilateral lower lobe atelectasis. No consolidation, pleural effusion or pneumothorax. Irregular solid nodule of the right upper lobe measuring 11 x 8 mm on series 8, image 43 and irregular subpleural nodule of the posterior right upper lobe measuring 11 mm on image 60, unchanged.   Musculoskeletal: No aggressive appearing osseous lesions.   CTA ABDOMEN AND PELVIS FINDINGS   Hepatobiliary: No focal liver abnormality is seen. Status post cholecystectomy. No biliary dilatation.   Pancreas: Unremarkable. No pancreatic ductal dilatation or surrounding inflammatory changes.   Spleen: Normal in size without focal abnormality.   Adrenals/Urinary Tract: Bilateral adrenal glands are unremarkable. No hydronephrosis or nephrolithiasis. Cystic lesion of the lower pole of the left kidney measuring 2.0 cm which appears slightly heterogeneous. Indeterminate small exophytic lesion of the upper pole of the right kidney measuring 1.2 cm on series 7, image 130. Additional bilateral low-attenuation renal lesions which are too small to accurately  characterize. Bladder is unremarkable.   Stomach/Bowel: Stomach is within normal limits. To next diverticulosis. No evidence of bowel wall thickening, distention, or inflammatory changes.   Vascular/lymphatic: Normal caliber abdominal aorta with mild atherosclerotic disease. No enlarged lymph nodes in the abdomen or pelvis.   Reproductive: Prostate is unremarkable.   Other: Small fat containing left inguinal hernias small ventral abdominal wall hernia located slightly left of midline on series 7, image 170 containing fat and calcifications. To next   Musculoskeletal: No aggressive appearing osseous lesions.   VASCULAR MEASUREMENTS PERTINENT TO TAVR:   AORTA:   Minimal Aortic Diameter-16.8 mm   Severity of Aortic Calcification-mild   RIGHT PELVIS:   Right Common Iliac Artery -   Minimal Diameter-10.6 mm   Tortuosity-mild   Calcification-none   Right External Iliac Artery -   Minimal Diameter-9.7 mm   Tortuosity-severe   Calcification-none   Right Common Femoral Artery -   Minimal Diameter-9.5 mm   Tortuosity-none   Calcification-none   LEFT PELVIS:   Left Common Iliac Artery -   Minimal Diameter-9.9 mm   Tortuosity-mild   Calcification-mild   Left External Iliac Artery -   Minimal Diameter-9.2 mm   Tortuosity-severe   Calcification-mild   Left Common Femoral Artery -   Minimal Diameter-9.0 mm   Tortuosity-none   Calcification-mild   Review of the MIP images confirms the above findings.   IMPRESSION: Vascular:   1. Vascular findings and measurements pertinent to potential TAVR procedure, as detailed above. 2. Thickening and calcification of the aortic valve, compatible with reported clinical history of aortic stenosis. 3. Mild aortoiliac atherosclerosis. Left main and 3 vessel coronary artery disease.   Nonvascular:   1. Two irregular solid nodules of the right upper lobe, stable when compared with January 01, 2022 prior. 2.  Bilateral indeterminate renal lesions. Recommend further evaluation with contrast-enhanced renal protocol CT or MRI.     Electronically Signed   By: Allegra Lai M.D.   On: 03/06/2023 11:54          Impression:   This 77 year old gentleman has stage D3, severe, symptomatic, low-flow/low gradient aortic stenosis with NYHA class II symptoms of exertional fatigue and shortness of breath consistent with chronic diastolic congestive heart failure.  He underwent mitral valve TEER in March 2024 with mild to moderate residual mitral regurgitation on echo.  I have personally reviewed his echocardiogram, cardiac catheterization, and CTA studies.  His echocardiogram shows a calcified aortic valve with restricted leaflet  mobility.  The mean gradient in April was 21 mmHg with a valve area of 0.8 cm and dimensionless index of 0.25 with a low stroke-volume index consistent with severe aortic stenosis.  His most recent TEE shows a valve area of 0.4 cm by planimetry and 0.88 cm by continuity equation with a mean gradient of 27 mmHg consistent with low-flow/low gradient severe aortic stenosis.  The valve appears trileaflet with severe calcification.  His cardiac catheterization showed patent left circumflex stenosis with moderate stenosis in the first diagonal and first obtuse marginal branch.  The mean aortic valve gradient was 30 mmHg.  I agree that aortic valve replacement is indicated in this patient for relief of his symptoms and to prevent progressive left ventricular dysfunction.  Given his advanced age and comorbid risk factors I think that transcatheter aortic valve replacement would be the best option for treating him.  His gated cardiac CTA shows anatomy suitable for TAVR using a 29 mm SAPIEN 3 valve.  His abdominal and pelvic CTA shows adequate pelvic vascular anatomy to allow transfemoral insertion.   The patient was counseled at length regarding treatment alternatives for management of severe  symptomatic aortic stenosis. The risks and benefits of surgical intervention has been discussed in detail. Long-term prognosis with medical therapy was discussed. Alternative approaches such as conventional surgical aortic valve replacement, transcatheter aortic valve replacement, and palliative medical therapy were compared and contrasted at length. This discussion was placed in the context of the patient's own specific clinical presentation and past medical history. All of his questions have been addressed.    Following the decision to proceed with transcatheter aortic valve replacement, a discussion was held regarding what types of management strategies would be attempted intraoperatively in the event of life-threatening complications, including whether or not the patient would be considered a candidate for the use of cardiopulmonary bypass and/or conversion to open sternotomy for attempted surgical intervention.  Given his advanced age and comorbid risk factors I do not think he is a candidate for emergent sternotomy to manage any intraoperative complications.  The patient has been advised of a variety of complications that might develop including but not limited to risks of death, stroke, paravalvular leak, aortic dissection or other major vascular complications, aortic annulus rupture, device embolization, cardiac rupture or perforation, mitral regurgitation, acute myocardial infarction, arrhythmia, heart block or bradycardia requiring permanent pacemaker placement, congestive heart failure, respiratory failure, renal failure, pneumonia, infection, other late complications related to structural valve deterioration or migration, or other complications that might ultimately cause a temporary or permanent loss of functional independence or other long term morbidity. The patient provides full informed consent for the procedure as described and all questions were answered.       Plan:   Transfemoral TAVR  using a 29 mm SAPIEN 3 valve.       Alleen Borne, MD

## 2023-04-22 NOTE — Progress Notes (Signed)
Anesthesia Chart Review:  Case: [id number] Date/Time: 04/23/23 1130   Procedures:      Transcatheter Aortic Valve Replacement, Transfemoral     INTRAOPERATIVE TRANSTHORACIC ECHOCARDIOGRAM   Anesthesia type: Monitor Anesthesia Care   Pre-op diagnosis: Severe Aortic Stenosis   [facility] (CARDIOLOGY) / [facility]   Providers: Kathleene Hazel, MD     CT Surgeon: Evelene Croon, MD   DISCUSSION: Patient is a 77 year old male scheduled for the above procedure.  History includes former smoker (quit 04/02/59), CAD (STEMI, s/p DES mid-distal LCX 10/921), severe mitral regurgitation (s/p Mitral edge to edge repair with PASCAL ACE x 2 10/10/22), severe AS, afib, pulmonary hypertension, chronic diastolic CHF, HTN, HLD, DM2, OSA (CPAP), cholecystectomy (03/02/20), LLE abscess (s/p excisional debridement 04/04/22, 04/06/22).   Cardiology medication instructions: "Stop taking Eliquis and Farxiga on Saturday, 9/28. You will take your last doses on Friday, 9/27.    Continue taking all other medications without change through the day before surgery."  I sent communication to Julieta Gutting, RN with the TAVR team regarding treatment for acute sinusitis on 04/15/23 with 7 days of Augmentin. Normal breath sounds at that time. 04/19/23 COVID-19 test was negative. 04/19/23 CXR report is still in process. He did not report acute respiratory symptoms during his PAT RN evaluation.   Anesthesia team to evaluate on the day of surgery.    VS:  BP Readings from Last 3 Encounters:  04/15/23 127/76  04/03/23 134/83  03/06/23 119/70   Pulse Readings from Last 3 Encounters:  04/15/23 78  04/03/23 83  03/06/23 61     PROVIDERS: [name], [name], MD is PCP  Levy Pupa, MD is pulmonologist [name], MD is primary cardiologist Lewayne Bunting, is EP cardiologist (05/02/20 consult) Marca Ancona, MD is HF cardiologist Tonny Bollman, MD is structural heart cardiologist (s/p  TEER) Verne Carrow, MD is structural heart cardiologist (for TAVR) Crista Elliot, MD is nephrologist   LABS: Preoperative labs noted. Renal function appears stable. Dr. Clifton James has marked labs as reviewed.  (all labs ordered are listed, but only abnormal results are displayed)  Labs Reviewed  COMPREHENSIVE METABOLIC PANEL - Abnormal; Notable for the following components:      Result Value   BUN 58 (*)    Creatinine, Ser 2.20 (*)    GFR, Estimated 30 (*)    All other components within normal limits  PROTIME-INR - Abnormal; Notable for the following components:   Prothrombin Time 19.8 (*)    INR 1.7 (*)    All other components within normal limits  URINALYSIS, ROUTINE W REFLEX MICROSCOPIC - Abnormal; Notable for the following components:   Color, Urine STRAW (*)    Glucose, UA 150 (*)    All other components within normal limits  SURGICAL PCR SCREEN  SARS CORONAVIRUS 2 (TAT 6-24 HRS)  CBC  TYPE AND SCREEN   Lab Results  Component Value Date   CREATININE 2.20 (H) 04/19/2023   CREATININE 2.22 (H) 02/05/2023   CREATININE 2.27 (H) 12/26/2022     OTHER: Pre Surgical Assessment: 5 M Walk Test 50M=16.39ft 5 Meter Walk Test- trial 1: 8.71 seconds 5 Meter Walk Test- trial 2: 7.78 seconds 5 Meter Walk Test- trial 3: 6.80 seconds 5 Meter Walk Test Average: 7.76 seconds  Sleep Study NPSG 09/18/21: IMPRESSIONS - Severe obstructive sleep apnea with an AHI of 84.8 and SpO2 low of 76%. - Supplemental oxygen was not applied during this study. RECOMMENDATIONS - Given the severity of  his sleep apnea and oxygen desaturation he should return to the sleep lab for a CPAP titration study.     IMAGES: CXR 04/19/23: In process.    EKG: 04/19/23: Atrial fibrillation Right superior axis deviation Nonspecific ST abnormality Prolonged QT Abnormal ECG When compared with ECG of 16-Jan-2023 16:04, pvcs no longer seen Confirmed by Alverda Skeans ([id number]) on 04/19/2023 10:40:59  PM   CV: RHC/LHC 02/05/23:   Mid RCA lesion is 30% stenosed.   Ramus lesion is 40% stenosed.   1st Diag lesion is 60% stenosed.   1st Mrg lesion is 70% stenosed.   Non-stenotic Mid Cx lesion was previously treated.   1. Normal RA pressure.  2. Mixed pulmonary venous/pulmonary arterial hypertension. Suspect patient has had pulmonary vascular remodeling with long-standing MR leading to a component of pulmonary arterial hypertension.  3. Mildly elevated PCWP with prominent v-waves consistent with known moderate residual MR post-Mitraclips.  4. Severe aortic stenosis by imaging with mean gradient 30 mmHg on this catheterization.  The aortic valve was surprisingly easy to cross.  5. Moderate stenosis in the D1 and OM1 with patient LCX stent.  No interventional target.   -Continue TAVR evaluation.    TEE 12/26/22: IMPRESSIONS   1. Left ventricular ejection fraction, by estimation, is 45 to 50%. The  left ventricle has mildly decreased function. The left ventricle  demonstrates global hypokinesis. There is mild left ventricular  hypertrophy.   2. D-shaped interventricular septum suggests a degree of RV  pressure/volume overload. Peak RV-RA gradient 59 mmHg. Right ventricular  systolic function is moderately reduced. The right ventricular size is  moderately enlarged.   3. Left atrial size was severely dilated. No left atrial/left atrial  appendage thrombus was detected.   4. Right atrial size was severely dilated.   5. 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   6. S/p MV repair via mTEER with 2 Pascal devices that are well-seated.  There is moderate residual mitral regurgitation. There is no systolic flow  reversal on pulmonary vein doppler interrogation. Mild mitral stenosis.  The mean mitral valve gradient is  5.0 mmHg.   7. There is severe AS, 0.4 cm^2 by planimetry and 0.88 cm^2 by Continuity  Equation with mean gradient 27 mmHg (low flow/low gradient severe AS). The  aortic valve is  tricuspid. There is severe calcifcation of the aortic  valve. Aortic valve regurgitation  is not visualized.    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 Excell Seltzer and Dr. Lynnette Caffey   Sleep apnea    STEMI (ST elevation myocardial infarction) [facility])     Past Surgical History:  Procedure Laterality Date   APPENDECTOMY     BUBBLE STUDY  10/02/2022   Procedure: BUBBLE STUDY;  Surgeon: Laurey Morale, MD;  Location: [facility] ENDOSCOPY;  Service: Cardiovascular;;   CHOLECYSTECTOMY N/A 03/02/2020   Procedure: LAPAROSCOPIC CHOLECYSTECTOMY;  Surgeon: Franky Macho, MD;  Location: [facility] ORS;  Service: General;  Laterality: N/A;   CORONARY STENT INTERVENTION N/A 04/30/2020   Procedure: CORONARY STENT INTERVENTION;  Surgeon: [place],  Demetria Pore, MD;  Location: [facility] LAB;  Service: Cardiovascular;  Laterality: N/A;   CORONARY/GRAFT ACUTE MI REVASCULARIZATION N/A 04/30/2020   Procedure: Coronary/Graft Acute MI Revascularization;  Surgeon: [place]il[place], Peter M, MD;  Location: [facility] INVASIVE CV LAB;  Service: Cardiovascular;  Laterality: N/A;   Eyelid Surgery Bilateral    HERNIA REPAIR     I & D EXTREMITY Left 04/06/2022   Procedure: LEFT LEG DEBRIDEMENT;  Surgeon: Nadara Mustard, MD;  Location: [facility] OR;  Service: Orthopedics;  Laterality: Left;   I & D EXTREMITY Left 04/04/2022   Procedure: LEFT LEG DEBRIDEMENT;  Surgeon: Nadara Mustard, MD;  Location: [facility] OR;  Service: Orthopedics;  Laterality: Left;   knee tendons repair     LEFT HEART CATH AND CORONARY ANGIOGRAPHY N/A 04/30/2020   Procedure: LEFT HEART CATH AND CORONARY ANGIOGRAPHY;   Surgeon: [place]azil[place], Peter M, MD;  Location: [facility] INVASIVE CV LAB;  Service: Cardiovascular;  Laterality: N/A;   MITRAL VALVE REPAIR N/A 10/10/2022   Procedure: MITRAL VALVE REPAIR;  Surgeon: Tonny Bollman, MD;  Location: [facility] INVASIVE CV LAB;  Service: Cardiovascular;  Laterality: N/A;   RIGHT HEART CATH N/A 10/03/2022   Procedure: RIGHT HEART CATH;  Surgeon: Laurey Morale, MD;  Location: [facility] INVASIVE CV LAB;  Service: Cardiovascular;  Laterality: N/A;   RIGHT HEART CATH N/A 12/26/2022   Procedure: RIGHT HEART CATH;  Surgeon: Laurey Morale, MD;  Location: [facility] INVASIVE CV LAB;  Service: Cardiovascular;  Laterality: N/A;   RIGHT/LEFT HEART CATH AND CORONARY ANGIOGRAPHY N/A 02/05/2023   Procedure: RIGHT/LEFT HEART CATH AND CORONARY ANGIOGRAPHY;  Surgeon: Laurey Morale, MD;  Location: [facility] INVASIVE CV LAB;  Service: Cardiovascular;  Laterality: N/A;   ROTATOR CUFF REPAIR Bilateral    TEE WITHOUT CARDIOVERSION N/A 10/02/2022   Procedure: TRANSESOPHAGEAL ECHOCARDIOGRAM (TEE);  Surgeon: Laurey Morale, MD;  Location: [facility] ENDOSCOPY;  Service: Cardiovascular;  Laterality: N/A;   TEE WITHOUT CARDIOVERSION N/A 10/10/2022   Procedure: TRANSESOPHAGEAL ECHOCARDIOGRAM;  Surgeon: Tonny Bollman, MD;  Location: [facility] INVASIVE CV LAB;  Service: Cardiovascular;  Laterality: N/A;   TEE WITHOUT CARDIOVERSION N/A 12/26/2022   Procedure: TRANSESOPHAGEAL ECHOCARDIOGRAM;  Surgeon: Laurey Morale, MD;  Location: [facility] INVASIVE CV LAB;  Service: Cardiovascular;  Laterality: N/A;   TONSILLECTOMY      MEDICATIONS:  amoxicillin-clavulanate (AUGMENTIN) 875-125 MG tablet   apixaban (ELIQUIS) 5 MG TABS tablet   ascorbic acid (VITAMIN C) 1000 MG tablet   atorvastatin (LIPITOR) 80 MG tablet   dapagliflozin propanediol (FARXIGA) 10 MG TABS tablet   midodrine (PROAMATINE) 2.5 MG tablet   Multiple Vitamin (MULTIVITAMIN WITH MINERALS) TABS tablet   neomycin-bacitracin-polymyxin [phone] OINT   nitroGLYCERIN (NITROSTAT) 0.4 MG SL  tablet   potassium chloride SA (KLOR-CON M20) 20 MEQ tablet   spironolactone (ALDACTONE) 25 MG tablet   Tiotropium Bromide Monohydrate (SPIRIVA RESPIMAT) 1.25 MCG/ACT AERS   TOLAK 4 % CREA   torsemide (DEMADEX) 20 MG tablet   No current facility-administered medications for this encounter.    Shonna Chock, PA-C Surgical Short Stay/Anesthesiology [facility] Phone [phone] [facility] Phone [phone] 04/22/2023 2:56 PM

## 2023-04-22 NOTE — Anesthesia Preprocedure Evaluation (Signed)
Anesthesia Evaluation    Reviewed: Allergy & Precautions, H&P , Patient's Chart, lab work & pertinent test results, Unable to perform ROS - Chart review only  Airway        Dental   Pulmonary former smoker          Cardiovascular hypertension, + CAD  + dysrhythmias      Neuro/Psych    GI/Hepatic   Endo/Other  diabetes, Type 2, Oral Hypoglycemic Agents    Renal/GU      Musculoskeletal  (+) Arthritis ,    Abdominal   Peds  Hematology   Anesthesia Other Findings   Reproductive/Obstetrics                             Anesthesia Physical Anesthesia Plan  ASA: III  Anesthesia Plan: General   Post-op Pain Management:    Induction: Intravenous  PONV Risk Score and Plan:   Airway Management Planned: Oral ETT  Additional Equipment:   Intra-op Plan:   Post-operative Plan: Extubation in OR  Informed Consent: I have reviewed the patients History and Physical, chart, labs and discussed the procedure including the risks, benefits and alternatives for the proposed anesthesia with the patient or authorized representative who has indicated his/her understanding and acceptance.       Plan Discussed with:   Anesthesia Plan Comments: (Pt presented to short stay in ?sinus tach in low 140's  vs afib with RVR. QRS complex on EKG today is wider than that on EKG 11/15/2024 as well. Will have sent to ED for evaluation.   PAT note written by Shonna Chock, PA-C.  )        Anesthesia Quick Evaluation

## 2023-04-23 ENCOUNTER — Inpatient Hospital Stay (HOSPITAL_COMMUNITY)
Admission: RE | Admit: 2023-04-23 | Discharge: 2023-04-24 | DRG: 267 | Disposition: A | Discharge status: Home / Self Care | Payer: Medicare Other | Attending: Surgery | Admitting: Surgery

## 2023-04-23 ENCOUNTER — Inpatient Hospital Stay (HOSPITAL_COMMUNITY): Payer: Medicare Other | Admitting: Certified Registered"

## 2023-04-23 ENCOUNTER — Inpatient Hospital Stay (HOSPITAL_COMMUNITY): Payer: Medicare Other | Admitting: Vascular Surgery

## 2023-04-23 ENCOUNTER — Encounter (HOSPITAL_COMMUNITY): Payer: Self-pay | Admitting: Cardiovascular Disease

## 2023-04-23 ENCOUNTER — Inpatient Hospital Stay (HOSPITAL_COMMUNITY): Payer: Medicare Other

## 2023-04-23 ENCOUNTER — Other Ambulatory Visit: Payer: Self-pay

## 2023-04-23 ENCOUNTER — Other Ambulatory Visit: Payer: Self-pay | Admitting: Physician Assistant

## 2023-04-23 ENCOUNTER — Encounter (HOSPITAL_COMMUNITY)
Admission: RE | Disposition: A | Discharge status: Home / Self Care | Payer: Self-pay | Source: Home / Self Care | Attending: Surgery

## 2023-04-23 DIAGNOSIS — Z9889 Other specified postprocedural states: Secondary | ICD-10-CM

## 2023-04-23 DIAGNOSIS — I350 Nonrheumatic aortic (valve) stenosis: Secondary | ICD-10-CM

## 2023-04-23 DIAGNOSIS — Y713 Surgical instruments, materials and cardiovascular devices (including sutures) associated with adverse incidents: Secondary | ICD-10-CM | POA: Diagnosis not present

## 2023-04-23 DIAGNOSIS — Z87891 Personal history of nicotine dependence: Secondary | ICD-10-CM | POA: Diagnosis not present

## 2023-04-23 DIAGNOSIS — I110 Hypertensive heart disease with heart failure: Secondary | ICD-10-CM | POA: Diagnosis not present

## 2023-04-23 DIAGNOSIS — Z833 Family history of diabetes mellitus: Secondary | ICD-10-CM

## 2023-04-23 DIAGNOSIS — I252 Old myocardial infarction: Secondary | ICD-10-CM

## 2023-04-23 DIAGNOSIS — Z006 Encounter for examination for normal comparison and control in clinical research program: Secondary | ICD-10-CM

## 2023-04-23 DIAGNOSIS — I2510 Atherosclerotic heart disease of native coronary artery without angina pectoris: Secondary | ICD-10-CM | POA: Diagnosis present

## 2023-04-23 DIAGNOSIS — Z8261 Family history of arthritis: Secondary | ICD-10-CM | POA: Diagnosis not present

## 2023-04-23 DIAGNOSIS — I5042 Chronic combined systolic (congestive) and diastolic (congestive) heart failure: Secondary | ICD-10-CM | POA: Diagnosis present

## 2023-04-23 DIAGNOSIS — G4733 Obstructive sleep apnea (adult) (pediatric): Secondary | ICD-10-CM | POA: Diagnosis present

## 2023-04-23 DIAGNOSIS — Y838 Other surgical procedures as the cause of abnormal reaction of the patient, or of later complication, without mention of misadventure at the time of the procedure: Secondary | ICD-10-CM | POA: Diagnosis not present

## 2023-04-23 DIAGNOSIS — I97638 Postprocedural hematoma of a circulatory system organ or structure following other circulatory system procedure: Secondary | ICD-10-CM | POA: Diagnosis not present

## 2023-04-23 DIAGNOSIS — E1122 Type 2 diabetes mellitus with diabetic chronic kidney disease: Secondary | ICD-10-CM | POA: Diagnosis present

## 2023-04-23 DIAGNOSIS — J449 Chronic obstructive pulmonary disease, unspecified: Secondary | ICD-10-CM | POA: Diagnosis present

## 2023-04-23 DIAGNOSIS — Z9049 Acquired absence of other specified parts of digestive tract: Secondary | ICD-10-CM | POA: Diagnosis not present

## 2023-04-23 DIAGNOSIS — I060 Rheumatic aortic stenosis: Principal | ICD-10-CM | POA: Diagnosis present

## 2023-04-23 DIAGNOSIS — Z888 Allergy status to other drugs, medicaments and biological substances status: Secondary | ICD-10-CM

## 2023-04-23 DIAGNOSIS — M48061 Spinal stenosis, lumbar region without neurogenic claudication: Secondary | ICD-10-CM | POA: Diagnosis present

## 2023-04-23 DIAGNOSIS — E785 Hyperlipidemia, unspecified: Secondary | ICD-10-CM | POA: Diagnosis present

## 2023-04-23 DIAGNOSIS — I130 Hypertensive heart and chronic kidney disease with heart failure and stage 1 through stage 4 chronic kidney disease, or unspecified chronic kidney disease: Secondary | ICD-10-CM | POA: Diagnosis present

## 2023-04-23 DIAGNOSIS — N184 Chronic kidney disease, stage 4 (severe): Secondary | ICD-10-CM | POA: Diagnosis present

## 2023-04-23 DIAGNOSIS — Z7901 Long term (current) use of anticoagulants: Secondary | ICD-10-CM | POA: Diagnosis not present

## 2023-04-23 DIAGNOSIS — Z6837 Body mass index (BMI) 37.0-37.9, adult: Secondary | ICD-10-CM

## 2023-04-23 DIAGNOSIS — Z952 Presence of prosthetic heart valve: Secondary | ICD-10-CM

## 2023-04-23 DIAGNOSIS — I4819 Other persistent atrial fibrillation: Secondary | ICD-10-CM | POA: Diagnosis present

## 2023-04-23 DIAGNOSIS — Z955 Presence of coronary angioplasty implant and graft: Secondary | ICD-10-CM | POA: Diagnosis not present

## 2023-04-23 DIAGNOSIS — I5023 Acute on chronic systolic (congestive) heart failure: Secondary | ICD-10-CM | POA: Diagnosis not present

## 2023-04-23 DIAGNOSIS — I5030 Unspecified diastolic (congestive) heart failure: Secondary | ICD-10-CM | POA: Diagnosis present

## 2023-04-23 DIAGNOSIS — Z8249 Family history of ischemic heart disease and other diseases of the circulatory system: Secondary | ICD-10-CM | POA: Diagnosis not present

## 2023-04-23 DIAGNOSIS — S301XXA Contusion of abdominal wall, initial encounter: Secondary | ICD-10-CM | POA: Insufficient documentation

## 2023-04-23 DIAGNOSIS — I4891 Unspecified atrial fibrillation: Secondary | ICD-10-CM | POA: Diagnosis present

## 2023-04-23 DIAGNOSIS — I272 Pulmonary hypertension, unspecified: Secondary | ICD-10-CM | POA: Diagnosis present

## 2023-04-23 DIAGNOSIS — I10 Essential (primary) hypertension: Secondary | ICD-10-CM | POA: Diagnosis present

## 2023-04-23 DIAGNOSIS — I890 Lymphedema, not elsewhere classified: Secondary | ICD-10-CM | POA: Diagnosis present

## 2023-04-23 DIAGNOSIS — Z954 Presence of other heart-valve replacement: Secondary | ICD-10-CM | POA: Diagnosis not present

## 2023-04-23 DIAGNOSIS — E119 Type 2 diabetes mellitus without complications: Secondary | ICD-10-CM

## 2023-04-23 DIAGNOSIS — Z79899 Other long term (current) drug therapy: Secondary | ICD-10-CM

## 2023-04-23 HISTORY — PX: TRANSCATHETER AORTIC VALVE REPLACEMENT, TRANSFEMORAL: SHX6400

## 2023-04-23 HISTORY — DX: Presence of prosthetic heart valve: Z95.2

## 2023-04-23 HISTORY — PX: INTRAOPERATIVE TRANSTHORACIC ECHOCARDIOGRAM: SHX6523

## 2023-04-23 LAB — POCT I-STAT, CHEM 8
BUN: 38 mg/dL — ABNORMAL HIGH (ref 8–23)
Calcium, Ion: 1.23 mmol/L (ref 1.15–1.40)
Chloride: 102 mmol/L (ref 98–111)
Creatinine, Ser: 2.1 mg/dL — ABNORMAL HIGH (ref 0.61–1.24)
Glucose, Bld: 144 mg/dL — ABNORMAL HIGH (ref 70–99)
HCT: 36 % — ABNORMAL LOW (ref 39.0–52.0)
Hemoglobin: 12.2 g/dL — ABNORMAL LOW (ref 13.0–17.0)
Potassium: 3.7 mmol/L (ref 3.5–5.1)
Sodium: 139 mmol/L (ref 135–145)
TCO2: 26 mmol/L (ref 22–32)

## 2023-04-23 LAB — ECHOCARDIOGRAM LIMITED
AR max vel: 2.45 cm2
AV Area VTI: 2.38 cm2
AV Area mean vel: 2.46 cm2
AV Mean grad: 5.5 mm[Hg]
AV Peak grad: 11.4 mm[Hg]
Ao pk vel: 1.69 m/s
MV M vel: 5.45 m/s
MV Peak grad: 118.8 mm[Hg]
MV VTI: 1.4 cm2
Radius: 0.5 cm
S' Lateral: 3.1 cm

## 2023-04-23 LAB — GLUCOSE, CAPILLARY: Glucose-Capillary: 116 mg/dL — ABNORMAL HIGH (ref 70–99)

## 2023-04-23 SURGERY — IMPLANTATION, AORTIC VALVE, TRANSCATHETER, FEMORAL APPROACH
Anesthesia: Monitor Anesthesia Care

## 2023-04-23 MED ORDER — ALBUMIN HUMAN 5 % IV SOLN
12.5000 g | Freq: Once | INTRAVENOUS | Status: AC
  Administered 2023-04-23: 12.5 g via INTRAVENOUS

## 2023-04-23 MED ORDER — PHENYLEPHRINE HCL-NACL 20-0.9 MG/250ML-% IV SOLN
INTRAVENOUS | Status: DC | PRN
  Administered 2023-04-23: 55 ug/min via INTRAVENOUS
  Administered 2023-04-23: 25 ug/min via INTRAVENOUS

## 2023-04-23 MED ORDER — SODIUM CHLORIDE 0.9% FLUSH
3.0000 mL | Freq: Two times a day (BID) | INTRAVENOUS | Status: DC
  Administered 2023-04-23 – 2023-04-24 (×2): 3 mL via INTRAVENOUS

## 2023-04-23 MED ORDER — SPIRONOLACTONE 25 MG PO TABS
25.0000 mg | ORAL_TABLET | Freq: Every day | ORAL | Status: DC
  Administered 2023-04-24: 25 mg via ORAL
  Filled 2023-04-23 (×2): qty 1

## 2023-04-23 MED ORDER — IODIXANOL 320 MG/ML IV SOLN
INTRAVENOUS | Status: DC | PRN
  Administered 2023-04-23: 25 mL via INTRA_ARTERIAL

## 2023-04-23 MED ORDER — ONDANSETRON HCL 4 MG/2ML IJ SOLN: 4.0000 mg | Freq: Four times a day (QID) | INTRAMUSCULAR | Status: DC | PRN

## 2023-04-23 MED ORDER — SODIUM CHLORIDE 0.9 % IV SOLN: INTRAVENOUS | Status: AC

## 2023-04-23 MED ORDER — TRAMADOL HCL 50 MG PO TABS: 50.0000 mg | ORAL_TABLET | ORAL | Status: DC | PRN

## 2023-04-23 MED ORDER — MORPHINE SULFATE (PF) 2 MG/ML IV SOLN
1.0000 mg | INTRAVENOUS | Status: DC | PRN
  Administered 2023-04-23: 2 mg via INTRAVENOUS

## 2023-04-23 MED ORDER — CHLORHEXIDINE GLUCONATE 4 % EX SOLN
60.0000 mL | Freq: Once | CUTANEOUS | Status: DC
  Filled 2023-04-23: qty 60

## 2023-04-23 MED ORDER — ALBUMIN HUMAN 5 % IV SOLN
INTRAVENOUS | Status: AC
  Filled 2023-04-23: qty 250

## 2023-04-23 MED ORDER — APIXABAN 5 MG PO TABS
5.0000 mg | ORAL_TABLET | Freq: Two times a day (BID) | ORAL | Status: DC
  Administered 2023-04-24: 5 mg via ORAL
  Filled 2023-04-23: qty 1

## 2023-04-23 MED ORDER — HEPARIN (PORCINE) IN NACL 1000-0.9 UT/500ML-% IV SOLN
INTRAVENOUS | Status: DC | PRN
  Administered 2023-04-23 (×2): 500 mL

## 2023-04-23 MED ORDER — ACETAMINOPHEN 325 MG PO TABS: 650.0000 mg | ORAL_TABLET | Freq: Four times a day (QID) | ORAL | Status: DC | PRN

## 2023-04-23 MED ORDER — SODIUM CHLORIDE 0.9 % IV SOLN: INTRAVENOUS | Status: DC

## 2023-04-23 MED ORDER — LIDOCAINE HCL (PF) 1 % IJ SOLN
INTRAMUSCULAR | Status: DC | PRN
  Administered 2023-04-23: 10 mL

## 2023-04-23 MED ORDER — DAPAGLIFLOZIN PROPANEDIOL 10 MG PO TABS
10.0000 mg | ORAL_TABLET | Freq: Every morning | ORAL | Status: DC
  Administered 2023-04-24: 10 mg via ORAL
  Filled 2023-04-23: qty 1

## 2023-04-23 MED ORDER — DEXMEDETOMIDINE HCL IN NACL 200 MCG/50ML IV SOLN
INTRAVENOUS | Status: DC | PRN
Start: 2023-04-23 — End: 2023-04-23
  Administered 2023-04-23 (×2): 4 ug via INTRAVENOUS
  Administered 2023-04-23: 8 ug via INTRAVENOUS

## 2023-04-23 MED ORDER — FENTANYL CITRATE (PF) 100 MCG/2ML IJ SOLN
INTRAMUSCULAR | Status: AC
  Filled 2023-04-23: qty 2

## 2023-04-23 MED ORDER — CHLORHEXIDINE GLUCONATE 4 % EX SOLN
30.0000 mL | CUTANEOUS | Status: DC
  Filled 2023-04-23: qty 30

## 2023-04-23 MED ORDER — SODIUM CHLORIDE 0.9% FLUSH: 3.0000 mL | INTRAVENOUS | Status: DC | PRN

## 2023-04-23 MED ORDER — ONDANSETRON HCL 4 MG/2ML IJ SOLN
INTRAMUSCULAR | Status: DC | PRN
  Administered 2023-04-23: 4 mg via INTRAVENOUS

## 2023-04-23 MED ORDER — CHLORHEXIDINE GLUCONATE 0.12 % MT SOLN
OROMUCOSAL | Status: AC
  Administered 2023-04-23: 15 mL via OROMUCOSAL
  Filled 2023-04-23: qty 15

## 2023-04-23 MED ORDER — ACETAMINOPHEN 650 MG RE SUPP: 650.0000 mg | Freq: Four times a day (QID) | RECTAL | Status: DC | PRN

## 2023-04-23 MED ORDER — HEPARIN SODIUM (PORCINE) 1000 UNIT/ML IJ SOLN
INTRAMUSCULAR | Status: DC | PRN
Start: 2023-04-23 — End: 2023-04-23
  Administered 2023-04-23: 15000 [IU] via INTRAVENOUS

## 2023-04-23 MED ORDER — POTASSIUM CHLORIDE CRYS ER 20 MEQ PO TBCR
20.0000 meq | EXTENDED_RELEASE_TABLET | Freq: Every day | ORAL | Status: DC
  Administered 2023-04-24: 20 meq via ORAL
  Filled 2023-04-23: qty 1

## 2023-04-23 MED ORDER — TORSEMIDE 20 MG PO TABS
60.0000 mg | ORAL_TABLET | Freq: Two times a day (BID) | ORAL | Status: DC
  Administered 2023-04-23 – 2023-04-24 (×2): 60 mg via ORAL
  Filled 2023-04-23 (×2): qty 3

## 2023-04-23 MED ORDER — MIDODRINE HCL 5 MG PO TABS
2.5000 mg | ORAL_TABLET | Freq: Three times a day (TID) | ORAL | Status: DC
  Administered 2023-04-24: 2.5 mg via ORAL
  Filled 2023-04-23: qty 1

## 2023-04-23 MED ORDER — CEFAZOLIN SODIUM-DEXTROSE 2-4 GM/100ML-% IV SOLN
2.0000 g | Freq: Three times a day (TID) | INTRAVENOUS | Status: AC
  Administered 2023-04-23: 2 g via INTRAVENOUS
  Filled 2023-04-23: qty 100

## 2023-04-23 MED ORDER — MORPHINE SULFATE (PF) 2 MG/ML IV SOLN
INTRAVENOUS | Status: AC
  Filled 2023-04-23: qty 1

## 2023-04-23 MED ORDER — PROPOFOL 500 MG/50ML IV EMUL
INTRAVENOUS | Status: DC | PRN
  Administered 2023-04-23: 75 ug/kg/min via INTRAVENOUS

## 2023-04-23 MED ORDER — ATORVASTATIN CALCIUM 80 MG PO TABS
80.0000 mg | ORAL_TABLET | Freq: Every day | ORAL | Status: DC
  Administered 2023-04-23 – 2023-04-24 (×2): 80 mg via ORAL
  Filled 2023-04-23 (×2): qty 1

## 2023-04-23 MED ORDER — EPHEDRINE SULFATE-NACL 50-0.9 MG/10ML-% IV SOSY
PREFILLED_SYRINGE | INTRAVENOUS | Status: DC | PRN
  Administered 2023-04-23: 10 mg via INTRAVENOUS

## 2023-04-23 MED ORDER — SODIUM CHLORIDE 0.9 % IV SOLN: 250.0000 mL | INTRAVENOUS | Status: DC | PRN

## 2023-04-23 MED ORDER — FENTANYL CITRATE (PF) 100 MCG/2ML IJ SOLN
INTRAMUSCULAR | Status: DC | PRN
  Administered 2023-04-23: 50 ug via INTRAVENOUS
  Administered 2023-04-23: 25 ug via INTRAVENOUS

## 2023-04-23 MED ORDER — CHLORHEXIDINE GLUCONATE 0.12 % MT SOLN
15.0000 mL | Freq: Once | OROMUCOSAL | Status: AC
  Filled 2023-04-23 (×2): qty 15

## 2023-04-23 MED ORDER — NITROGLYCERIN IN D5W 200-5 MCG/ML-% IV SOLN: 0.0000 ug/min | INTRAVENOUS | Status: DC

## 2023-04-23 MED ORDER — PROTAMINE SULFATE 10 MG/ML IV SOLN
INTRAVENOUS | Status: DC | PRN
Start: 2023-04-23 — End: 2023-04-23
  Administered 2023-04-23 (×3): 10 mg via INTRAVENOUS
  Administered 2023-04-23: 20 mg via INTRAVENOUS

## 2023-04-23 MED ORDER — OXYCODONE HCL 5 MG PO TABS: 5.0000 mg | ORAL_TABLET | ORAL | Status: DC | PRN

## 2023-04-23 SURGICAL SUPPLY — 27 items
BAG SNAP BAND KOVER 36X36 (MISCELLANEOUS) ×2 IMPLANT
CABLE ADAPT PACING TEMP 12FT (ADAPTER) IMPLANT
CATH COMMANDER DELIVERY SYS 29 (CATHETERS) IMPLANT
CATH DIAG 6FR PIGTAIL ANGLED (CATHETERS) IMPLANT
CATH INFINITI 5FR ANG PIGTAIL (CATHETERS) IMPLANT
CATH INFINITI 6F AL2 (CATHETERS) IMPLANT
CATH S G BIP PACING (CATHETERS) IMPLANT
CLOSURE MYNX CONTROL 6F/7F (Vascular Products) IMPLANT
CLOSURE PERCLOSE PROSTYLE (VASCULAR PRODUCTS) IMPLANT
CRIMPER (MISCELLANEOUS) IMPLANT
DEVICE INFLATION ATRION QL38 (MISCELLANEOUS) IMPLANT
KIT HEART LEFT (KITS) ×1 IMPLANT
KIT MICROPUNCTURE NIT STIFF (SHEATH) IMPLANT
KIT SAPIAN 3 ULTRA RESILIA 29 (Valve) IMPLANT
PACK CARDIAC CATHETERIZATION (CUSTOM PROCEDURE TRAY) ×1 IMPLANT
SET ATX-X65L (MISCELLANEOUS) IMPLANT
SHEATH BRITE TIP 7FR 35CM (SHEATH) IMPLANT
SHEATH INTRODUCER SET 29 (SHEATH) IMPLANT
SHEATH PINNACLE 6F 10CM (SHEATH) IMPLANT
SHEATH PINNACLE 8F 10CM (SHEATH) IMPLANT
STOPCOCK MORSE 400PSI 3WAY (MISCELLANEOUS) ×2 IMPLANT
TRANSDUCER W/STOPCOCK (MISCELLANEOUS) ×2 IMPLANT
WIRE AMPLATZ SS-J .035X180CM (WIRE) IMPLANT
WIRE EMERALD 3MM-J .035X150CM (WIRE) IMPLANT
WIRE EMERALD 3MM-J .035X260CM (WIRE) IMPLANT
WIRE EMERALD ST .035X260CM (WIRE) IMPLANT
WIRE SAFARI SM CURVE 275 (WIRE) IMPLANT

## 2023-04-23 NOTE — CV Procedure (Signed)
HEART AND VASCULAR CENTER  TAVR OPERATIVE NOTE   Date of Procedure:  04/23/2023  Preoperative Diagnosis: Severe Aortic Stenosis   Postoperative Diagnosis: Same   Procedure:   Transcatheter Aortic Valve Replacement - Transfemoral Approach  Edwards Sapien 3 THV (size 29 mm, model # [id number], serial # [id number] )   Co-Surgeons:  Verne Carrow, MD and Evelene Croon , MD   Anesthesiologist:  Bradley Ferris  Echocardiographer:  Eden Emms  Pre-operative Echo Findings: Severe aortic stenosis Mild left ventricular systolic dysfunction  Post-operative Echo Findings: No paravalvular leak Mild left ventricular systolic dysfunction  BRIEF CLINICAL NOTE AND INDICATIONS FOR SURGERY  77 yo male with chronic diastolic CHF, severe aortic stenosis and severe mitral regurgitation post transcatheter edge to edge repair of the MV here today for TAVR. Ongoing dyspnea and fatigue.   During the course of the patient's preoperative work up they have been evaluated comprehensively by a multidisciplinary team of specialists coordinated through the Multidisciplinary Heart Valve Clinic in the [facility].  They have been demonstrated to suffer from symptomatic severe aortic stenosis as noted above. The patient has been counseled extensively as to the relative risks and benefits of all options for the treatment of severe aortic stenosis including long term medical therapy, conventional surgery for aortic valve replacement, and transcatheter aortic valve replacement.  The patient has been independently evaluated by Dr. Laneta Simmers with CT surgery and they are felt to be at high risk for conventional surgical aortic valve replacement. The surgeon indicated the patient would be a poor candidate for conventional surgery. Based upon review of all of the patient's preoperative diagnostic tests they are felt to be candidate for transcatheter aortic valve replacement using the transfemoral approach as  an alternative to high risk conventional surgery.    Following the decision to proceed with transcatheter aortic valve replacement, a discussion has been held regarding what types of management strategies would be attempted intraoperatively in the event of life-threatening complications, including whether or not the patient would be considered a candidate for the use of cardiopulmonary bypass and/or conversion to open sternotomy for attempted surgical intervention.  The patient has been advised of a variety of complications that might develop peculiar to this approach including but not limited to risks of death, stroke, paravalvular leak, aortic dissection or other major vascular complications, aortic annulus rupture, device embolization, cardiac rupture or perforation, acute myocardial infarction, arrhythmia, heart block or bradycardia requiring permanent pacemaker placement, congestive heart failure, respiratory failure, renal failure, pneumonia, infection, other late complications related to structural valve deterioration or migration, or other complications that might ultimately cause a temporary or permanent loss of functional independence or other long term morbidity.  The patient provides full informed consent for the procedure as described and all questions were answered preoperatively.    DETAILS OF THE OPERATIVE PROCEDURE  PREPARATION:   The patient is brought to the operating room on the above mentioned date and central monitoring was established by the anesthesia team including placement of a radial arterial line. The patient is placed in the supine position on the operating table.  Intravenous antibiotics are administered. Conscious sedation is used.   Baseline transthoracic echocardiogram was performed. The patient's chest, abdomen, both groins, and both lower extremities are prepared and draped in a sterile manner. A time out procedure is performed.   PERIPHERAL ACCESS:   Using the  modified Seldinger technique, femoral arterial and venous access were obtained with placement of a 6 Fr sheath in the  artery and a 7 Fr sheath in the vein on the left side using u/s guidance.  A pigtail diagnostic catheter was passed through the femoral arterial sheath under fluoroscopic guidance into the aortic root.  A temporary transvenous pacemaker catheter was passed through the femoral venous sheath under fluoroscopic guidance into the right ventricle.  The pacemaker was tested to ensure stable lead placement and pacemaker capture. Aortic root angiography was performed in order to determine the optimal angiographic angle for valve deployment.  TRANSFEMORAL ACCESS:  A micropuncture kit was used to gain access to the right femoral artery using u/s guidance. Position confirmed with angiography. Pre-closure with double ProGlide closure devices. The patient was heparinized systemically and ACT verified > 250 seconds.    A 16 Fr transfemoral E-sheath was introduced into the right femoral artery after progressively dilating over an Amplatz superstiff wire. An AL-2 catheter was used to direct a J wire across the native aortic valve into the left ventricle. This was exchanged out for a pigtail catheter and position was confirmed in the LV apex. Simultaneous LV and Ao pressures were recorded.  The pigtail catheter was then exchanged for a Safari wire in the LV apex.   TRANSCATHETER HEART VALVE DEPLOYMENT:  An Edwards Sapien 3 THV (size 29 mm) was prepared and crimped per manufacturer's guidelines, and the proper orientation of the valve is confirmed on the [employer] delivery system. The valve was advanced through the introducer sheath using normal technique until in an appropriate position in the abdominal aorta beyond the sheath tip. The balloon was then retracted and using the fine-tuning wheel was centered on the valve. The valve was then advanced across the aortic arch using appropriate flexion of  the catheter. The valve was carefully positioned across the aortic valve annulus. The Commander catheter was retracted using normal technique. Once final position of the valve has been confirmed by angiographic assessment, the valve is deployed while temporarily holding ventilation and during rapid ventricular pacing to maintain systolic blood pressure < 50 mmHg and pulse pressure < 10 mmHg. The balloon inflation is held for >3 seconds after reaching full deployment volume. Once the balloon has fully deflated the balloon is retracted into the ascending aorta and valve function is assessed using TTE. There is felt to be no paravalvular leak and no central aortic insufficiency.  The patient's hemodynamic recovery following valve deployment is good.  The deployment balloon and guidewire are both removed. Echo demostrated acceptable post-procedural gradients, stable mitral valve function, and no AI.   PROCEDURE COMPLETION:  The sheath was then removed and closure devices were completed. Protamine was administered once femoral arterial repair was complete. The temporary pacemaker, pigtail catheters and femoral sheaths were removed with a Mynx closure device placed in the artery and manual pressure used for venous hemostasis.    The patient tolerated the procedure well and is transported to the surgical intensive care in stable condition. There were no immediate intraoperative complications. All sponge instrument and needle counts are verified correct at completion of the operation.   No blood products were administered during the operation.  The patient received a total of 25 mL of intravenous contrast during the procedure.  LVEDP: 12 mm Hg  Verne Carrow MD, [facility] 04/23/2023 1:11 PM

## 2023-04-23 NOTE — Progress Notes (Addendum)
  HEART AND VASCULAR CENTER   MULTIDISCIPLINARY HEART VALVE TEAM  Patient doing well s/p TAVR. He is hemodynamically stable- BP improved after administration of albumin. Left groin sites stable but right groin oozing blood and with significant hematoma. Manual pressure held for ~15 min. A femstop was placed at 30mm hg for 30 minutes. ECG atrial fibrillation with no high grade block. Plan to transfer to from cath lab holding to 4E when bed available. Early ambulation after bedrest completed and hopeful discharge over the next 24-48 hours.   Cline Crock PA-C  [facility]  Pager [phone]

## 2023-04-23 NOTE — Progress Notes (Addendum)
---   Noted at 1430 rt groin bleeding. Immediately held manual pressure until femstop was applied. Slowly deflated and removed femstop at 1525. Moderate pressure held for an additional 15 minutes then dressed w/gauze and tegaderm. Small, 1 inch skin tear above stick site.Rt dp and pt 1+ palpable. Rt groin level 1 mostly softened, very slightly semi firm hematoma approx 2 inches wide above stick site. No further bleeding. Faint bruising. No c/o discomfort. Vital signs stable. Left groin level 0 .Bedrest restarts at 1545.

## 2023-04-23 NOTE — Progress Notes (Signed)
Carlean Jews PA at bedside; placed femstop to rt groin, inflated bulb to 30mm  for 30 minutes per order. Rt dp and pt dopplered. Skin tear noted rt groin below and lateral to stick site prior to applying femstop. Faint localized bruising.

## 2023-04-23 NOTE — Discharge Instructions (Signed)

## 2023-04-23 NOTE — Interval H&P Note (Signed)
History and Physical Interval Note:  04/23/2023 9:41 AM  Ryan Garner  has presented today for surgery, with the diagnosis of Severe Aortic Stenosis.  The various methods of treatment have been discussed with the patient and family. After consideration of risks, benefits and other options for treatment, the patient has consented to  Procedure(s): Transcatheter Aortic Valve Replacement, Transfemoral (N/A) INTRAOPERATIVE TRANSTHORACIC ECHOCARDIOGRAM (N/A) as a surgical intervention.  The patient's history has been reviewed, patient examined, no change in status, stable for surgery.  I have reviewed the patient's chart and labs.  Questions were answered to the patient's satisfaction.     Alleen Borne

## 2023-04-23 NOTE — Op Note (Signed)
[facility]   MULTIDISCIPLINARY HEART VALVE TEAM   TAVR OPERATIVE NOTE   Date of Procedure:  04/23/2023  Preoperative Diagnosis: Severe Aortic Stenosis   Postoperative Diagnosis: Same   Procedure:   Transcatheter Aortic Valve Replacement - Percutaneous Right Transfemoral Approach  Edwards Sapien 3 Ultra ResiliaTHV (size 29 mm, model # 9755RSL, serial # [id number])   Co-Surgeons:  Alleen Borne, MD and Verne Carrow, MD   Anesthesiologist:  Hyman Bower, MD  Echocardiographer:  P. Eden Emms, MD  Pre-operative Echo Findings: Severe aortic stenosis Mild left ventricular systolic dysfunction  Post-operative Echo Findings: No paravalvular leak Unchanged mild left ventricular systolic dysfunction   BRIEF CLINICAL NOTE AND INDICATIONS FOR SURGERY  This 77 year old gentleman has stage D3, severe, symptomatic, low-flow/low gradient aortic stenosis with NYHA class II symptoms of exertional fatigue and shortness of breath consistent with chronic diastolic congestive heart failure.  He underwent mitral valve TEER in March 2024 with mild to moderate residual mitral regurgitation on echo.  I have personally reviewed his echocardiogram, cardiac catheterization, and CTA studies.  His echocardiogram shows a calcified aortic valve with restricted leaflet mobility.  The mean gradient in April was 21 mmHg with a valve area of 0.8 cm and dimensionless index of 0.25 with a low stroke-volume index consistent with severe aortic stenosis.  His most recent TEE shows a valve area of 0.4 cm by planimetry and 0.88 cm by continuity equation with a mean gradient of 27 mmHg consistent with low-flow/low gradient severe aortic stenosis.  The valve appears trileaflet with severe calcification.  His cardiac catheterization showed patent left circumflex stenosis with moderate stenosis in the first diagonal and first obtuse marginal branch.  The mean aortic valve gradient was 30 mmHg.  I agree  that aortic valve replacement is indicated in this patient for relief of his symptoms and to prevent progressive left ventricular dysfunction.  Given his advanced age and comorbid risk factors I think that transcatheter aortic valve replacement would be the best option for treating him.  His gated cardiac CTA shows anatomy suitable for TAVR using a 29 mm SAPIEN 3 valve.  His abdominal and pelvic CTA shows adequate pelvic vascular anatomy to allow transfemoral insertion.   The patient was counseled at length regarding treatment alternatives for management of severe symptomatic aortic stenosis. The risks and benefits of surgical intervention has been discussed in detail. Long-term prognosis with medical therapy was discussed. Alternative approaches such as conventional surgical aortic valve replacement, transcatheter aortic valve replacement, and palliative medical therapy were compared and contrasted at length. This discussion was placed in the context of the patient's own specific clinical presentation and past medical history. All of his questions have been addressed.    Following the decision to proceed with transcatheter aortic valve replacement, a discussion was held regarding what types of management strategies would be attempted intraoperatively in the event of life-threatening complications, including whether or not the patient would be considered a candidate for the use of cardiopulmonary bypass and/or conversion to open sternotomy for attempted surgical intervention.  Given his advanced age and comorbid risk factors I do not think he is a candidate for emergent sternotomy to manage any intraoperative complications.  The patient has been advised of a variety of complications that might develop including but not limited to risks of death, stroke, paravalvular leak, aortic dissection or other major vascular complications, aortic annulus rupture, device embolization, cardiac rupture or perforation, mitral  regurgitation, acute myocardial infarction, arrhythmia, heart block or bradycardia  requiring permanent pacemaker placement, congestive heart failure, respiratory failure, renal failure, pneumonia, infection, other late complications related to structural valve deterioration or migration, or other complications that might ultimately cause a temporary or permanent loss of functional independence or other long term morbidity. The patient provides full informed consent for the procedure as described and all questions were answered.     DETAILS OF THE OPERATIVE PROCEDURE  PREPARATION:    The patient was brought to the operating room on the above mentioned date and appropriate monitoring was established by the anesthesia team. The patient was placed in the supine position on the operating table.  Intravenous antibiotics were administered. The patient was monitored closely throughout the procedure under conscious sedation.    Baseline transthoracic echocardiogram was performed. The patient's abdomen and both groins were prepped and draped in a sterile manner. A time out procedure was performed.   PERIPHERAL ACCESS:    Using the modified Seldinger technique, femoral arterial and venous access was obtained with placement of 6 Fr sheaths on the left side.  A pigtail diagnostic catheter was passed through the left arterial sheath under fluoroscopic guidance into the aortic root.  A temporary transvenous pacemaker catheter was passed through the left femoral venous sheath under fluoroscopic guidance into the right ventricle.  The pacemaker was tested to ensure stable lead placement and pacemaker capture. Aortic root angiography was performed in order to determine the optimal angiographic angle for valve deployment.   TRANSFEMORAL ACCESS:   Percutaneous transfemoral access and sheath placement was performed using ultrasound guidance.  The right common femoral artery was cannulated using a micropuncture needle  and appropriate location was verified using hand injection angiogram.  A pair of Abbott Perclose percutaneous closure devices were placed and a 6 French sheath replaced into the femoral artery.  The patient was heparinized systemically and ACT verified > 250 seconds.    A 16 Fr transfemoral E-sheath was introduced into the right common femoral artery after progressively dilating over an Amplatz superstiff wire. An AL-2 catheter was used to direct a straight-tip exchange length wire across the native aortic valve into the left ventricle. This was exchanged out for a pigtail catheter and position was confirmed in the LV apex. Simultaneous LV and Ao pressures were recorded.  The pigtail catheter was exchanged for a Safari wire in the LV apex.   BALLOON AORTIC VALVULOPLASTY:   Not performed   TRANSCATHETER HEART VALVE DEPLOYMENT:   An Edwards Sapien 3 Ultra transcatheter heart valve (size 29 mm) was prepared and crimped per manufacturer's guidelines, and the proper orientation of the valve is confirmed on the [employer] delivery system. The valve was advanced through the introducer sheath using normal technique until in an appropriate position in the abdominal aorta beyond the sheath tip. The balloon was then retracted and using the fine-tuning wheel was centered on the valve. The valve was then advanced across the aortic arch using appropriate flexion of the catheter. The valve was carefully positioned across the aortic valve annulus. The Commander catheter was retracted using normal technique. Once final position of the valve has been confirmed by angiographic assessment, the valve is deployed during rapid ventricular pacing to maintain systolic blood pressure < 50 mmHg and pulse pressure < 10 mmHg. The balloon inflation is held for >3 seconds after reaching full deployment volume. Once the balloon has fully deflated the balloon is retracted into the ascending aorta and valve function is assessed  using echocardiography. There is felt to be no  paravalvular leak and no central aortic insufficiency.  The patient's hemodynamic recovery following valve deployment is good.  The deployment balloon and guidewire are both removed.    PROCEDURE COMPLETION:   The sheath was removed and femoral artery closure performed.  Protamine was administered once femoral arterial repair was complete. The temporary pacemaker, pigtail catheter and femoral sheaths were removed with manual pressure used for venous hemostasis.  A Mynx femoral closure device was utilized following removal of the diagnostic sheath in the left femoral artery.  The patient tolerated the procedure well and is transported to the cath lab recovery area in stable condition. There were no immediate intraoperative complications. All sponge instrument and needle counts are verified correct at completion of the operation.   No blood products were administered during the operation.  The patient received a total of 25 mL of intravenous contrast during the procedure.   Alleen Borne, MD 04/23/2023

## 2023-04-23 NOTE — Progress Notes (Signed)
  Echocardiogram 2D Echocardiogram has been performed.  Delcie Roch 04/23/2023, 12:54 PM

## 2023-04-23 NOTE — Transfer of Care (Signed)
Immediate Anesthesia Transfer of Care Note  Patient: Ryan Garner  Procedure(s) Performed: Transcatheter Aortic Valve Replacement, Transfemoral INTRAOPERATIVE TRANSTHORACIC ECHOCARDIOGRAM  Patient Location: PACU and Cath Lab  Anesthesia Type:MAC  Level of Consciousness: awake, patient cooperative, and responds to stimulation  Airway & Oxygen Therapy: Patient Spontanous Breathing and Patient connected to face mask oxygen  Post-op Assessment: Report given to RN, Post -op Vital signs reviewed and stable, and Patient moving all extremities X 4  Post vital signs: Reviewed and stable  Last Vitals:  Vitals Value Taken Time  BP 93/65 04/23/23 1325  Temp 36.4 C 04/23/23 1315  Pulse 67 04/23/23 1327  Resp 15 04/23/23 1327  SpO2 100 % 04/23/23 1327  Vitals shown include unfiled device data.  Last Pain:  Vitals:   04/23/23 1315  TempSrc: Temporal  PainSc: 0-No pain         Complications: There were no known notable events for this encounter.

## 2023-04-23 NOTE — Discharge Summary (Signed)
[facility]   MULTIDISCIPLINARY HEART VALVE TEAM  Discharge Summary    Patient ID: Ryan Garner MRN: [id number]; DOB: 09/01/1945  Admit date: 04/23/2023 Discharge date: 04/24/2023  Primary Care Provider: [name], [name], MD  Primary Cardiologist: [name], MD / Dr. Aundra Dubin & Dr. Laneta Simmers (TAVR)  Discharge Diagnoses    Principal Problem:   S/P TAVR (transcatheter aortic valve replacement) Active Problems:   Hypertension   Hyperlipidemia   Type 2 diabetes mellitus (HCC)   Morbid obesity (HCC)   Atrial fibrillation (HCC)   Spinal stenosis of lumbar region   COPD (chronic obstructive pulmonary disease) (HCC)   (HFpEF) heart failure with preserved ejection fraction (HCC)   Nonrheumatic aortic valve stenosis   Pulmonary hypertension (HCC)   OSA (obstructive sleep apnea)   Lymphedema   S/P mitral valve clip implantation   Groin hematoma  Allergies Allergies  Allergen Reactions   Flecainide Other (See Comments)    dizziness, shortness of breath, blurred vision   Metoprolol Tartrate Itching   Rofecoxib Rash and Other (See Comments)    (Vioxx)   Diagnostic Studies/Procedures    [facility]  TAVR OPERATIVE NOTE     Date of Procedure:                04/23/2023   Preoperative Diagnosis:      Severe Aortic Stenosis    Postoperative Diagnosis:    Same    Procedure:        Transcatheter Aortic Valve Replacement - Transfemoral Approach             Edwards Sapien 3 THV (size 29 mm, model # [id number], serial # [id number] )              Co-Surgeons:                        Verne Carrow, MD and Evelene Croon , MD    Anesthesiologist:                  Bradley Ferris   Echocardiographer:              Eden Emms   Pre-operative Echo Findings: Severe aortic stenosis Mild left ventricular systolic dysfunction   Post-operative Echo Findings: No paravalvular leak Mild left ventricular systolic dysfunction _____________    Echo 04/24/23:  Completed but pending formal read at the time of discharge   History of Present Illness     Ryan Garner is a 77 y.o. male with a history of HTN, HLD, CAD s/p PCI to LCx (2021), HFpEF, persistent atrial fibrillation on Eliquis, CKD stage IV, HFrEF severe fMR s/p mTEER with Pascal x2 (10/10/22), and paradoxical LFLG AS who presented to [facility] on 04/23/23 for planned TAVR.   He was admitted 2/21-3/21/24 for acute HFpEF requiring dobutamine for RV support and diuresed over 90lbs. Found to have severe functional mitral regurgitation and underwent mitral transcatheter edge-to-edge repair with PASCAL ACE (x2) in on 10/11/22. This was complicated by a rectus sheath hematoma. He has done well in follow up but noted to have worsening heart failure more recently. Follow up TTE on 11/16/2022 showed EF 50-55%, stable mitral clips and possible LFLG AS. Follow up TEE 12/26/22 showed EF 45%, moderate RV dysfunction, severe AS with mean grad  27 mmHg, peak grad 41.3 mmHg, AVA 0.9 cm2, DVI  0.20, SVI 30, s/p mTEER with moderate residual MR. [facility] 02/05/23 showed moderate stenosis in the D1  and OM1 with patient LCX stent and no interventional targets as well as mildly elevated PCWP with prominent v-waves consistent with known moderate residual MR post-Mitraclips.  He was evaluated by the multidisciplinary valve team and felt to have severe, symptomatic aortic stenosis and to be a suitable candidate for TAVR, which was set up for 04/23/23.   Hospital Course     Consultants: None   Severe AS: s/p successful TAVR with a 29 mm Edwards Sapien 3 Ultra Resilia THV via the TF approach on 04/23/23. Post operative echo completed but pending formal read. Patient had post op right groin hematoma that was controlled with FemStop placement. Sites appear stable with no evidence of bleeding or recurrent hematoma today. ECG with atrial fibrillation and no high grade heart block. Resumed on home Eliquis 5mg  BID. Patient ambulated with cardiac  rehab with no issues. Discharge instructions reviewed with understanding. Dental SBE discussed and will be RXd at [facility] follow up next week.   HFpEF: LVEDP was normal (12 mm Hg) at the time of TAVR. Resumed on home torsemide 20mg  BID/kdur 20eq daily, Farixga 10mg  daily and spironolactone 25mg  daily. Appears euvolemic today.   CAD: [facility] 02/05/23 showed moderate stenosis in the D1 and OM1 with patient LCX stent and no interventional targets. Continue medical therapy. No aspirin given need for OAC.   Persistent atrial fibrillation: Resumed on home Eliquis 5mg  BID. Rate well controlled in the 60's.   CKD stage IIIb: Baseline Cr appears to be in the 1.4-1.8 range with pre-TAVR Cr around 2.0. Cr stabilized today at 1.86.   Severe MR s/p mTEER: with moderate residual MR on recent echo imaging.   Renal lesions: pre-TAVR CT showed "bilateral indeterminate renal lesions. Recommend further evaluation with contrast-enhanced renal protocol CT or MRI." This will be discussed in the outpatient setting.  _____________  Discharge Vitals Blood pressure (!) 144/72, pulse 84, temperature 98.5 F (36.9 C), temperature source Oral, resp. rate (!) 22, height 5\' 7"  (1.702 m), weight 111.4 kg, SpO2 100%.  Filed Weights   04/23/23 0912 04/24/23 0437  Weight: 107.5 kg 111.4 kg   General: Well developed, well nourished, NAD Lungs:Clear to ausculation bilaterally. No wheezes, rales, or rhonchi. Breathing is unlabored. Cardiovascular: Irregularly irregular. Soft flow murmur Abdomen: Soft, non-tender, non-distended. No obvious abdominal masses. Extremities: Trace chronic BLE. Groin sites without evidence of bleeding or hematoma. Neuro: Alert and oriented. No focal deficits. No facial asymmetry. MAE spontaneously. Psych: Responds to questions appropriately with normal affect.    Labs & Radiologic Studies    CBC Recent Labs    04/23/23 1257 04/24/23 0347  WBC  --  8.8  HGB 12.2* 11.6*  HCT 36.0* 36.0*  MCV  --   91.4  PLT  --  225   Basic Metabolic Panel Recent Labs    29/52/84 1257 04/24/23 0347  NA 139 135  K 3.7 3.6  CL 102 101  CO2  --  22  GLUCOSE 144* 106*  BUN 38* 38*  CREATININE 2.10* 1.86*  CALCIUM  --  9.4  MG  --  2.0   Liver Function Tests No results for input(s): "AST", "ALT", "ALKPHOS", "BILITOT", "PROT", "ALBUMIN" in the last 72 hours. No results for input(s): "LIPASE", "AMYLASE" in the last 72 hours. Cardiac Enzymes No results for input(s): "CKTOTAL", "CKMB", "CKMBINDEX", "TROPONINI" in the last 72 hours. BNP Invalid input(s): "POCBNP" D-Dimer No results for input(s): "DDIMER" in the last 72 hours. Hemoglobin A1C No results for input(s): "HGBA1C" in the last  72 hours. Fasting Lipid Panel No results for input(s): "CHOL", "HDL", "LDLCALC", "TRIG", "CHOLHDL", "LDLDIRECT" in the last 72 hours. Thyroid Function Tests No results for input(s): "TSH", "T4TOTAL", "T3FREE", "THYROIDAB" in the last 72 hours.  Invalid input(s): "FREET3" _____________   Disposition   Pt is being discharged home today in good condition.  Follow-up Plans & Appointments    Follow-up Information     Filbert Schilder, NP. Go on 05/06/2023.   Specialty: Cardiology Why: @ 8:40am, please arrive at least 10 minutes early. Contact information: [address] [place] [address] [phone]                Discharge Instructions     Call MD for:  difficulty breathing, headache or visual disturbances   Complete by: As directed    Call MD for:  extreme fatigue   Complete by: As directed    Call MD for:  hives   Complete by: As directed    Call MD for:  persistant dizziness or light-headedness   Complete by: As directed    Call MD for:  persistant nausea and vomiting   Complete by: As directed    Call MD for:  redness, tenderness, or signs of infection (pain, swelling, redness, odor or green/yellow discharge around incision site)   Complete by: As directed    Call  MD for:  severe uncontrolled pain   Complete by: As directed    Call MD for:  temperature >100.4   Complete by: As directed    Diet - low sodium heart healthy   Complete by: As directed    Discharge instructions   Complete by: As directed    ACTIVITY AND EXERCISE  Daily activity and exercise are an important part of your recovery. People recover at different rates depending on their general health and type of valve procedure.  Most people recovering from TAVR feel better relatively quickly   No lifting, pushing, pulling more than 10 pounds (examples to avoid: groceries, vacuuming, gardening, golfing):             - For one week with a procedure through the groin.             - For six weeks for procedures through the chest wall or neck. NOTE: You will typically see one of our providers 7-14 days after your procedure to discuss WHEN TO RESUME the above activities.      DRIVING  Do not drive until you are seen for follow up and cleared by a provider. Generally, we ask patient to not drive for 1 week after their procedure.  If you have been told by your doctor in the past that you may not drive, you must talk with him/her before you begin driving again.   DRESSING  Groin site: you may leave the clear dressing over the site for up to one week or until it falls off.   HYGIENE  If you had a femoral (leg) procedure, you may take a shower when you return home. After the shower, pat the site dry. Do NOT use powder, oils or lotions in your groin area until the site has completely healed.  If you had a chest procedure, you may shower when you return home unless specifically instructed not to by your discharging practitioner.             - DO NOT scrub incision; pat dry with a towel.             -  DO NOT apply any lotions, oils, powders to the incision.             - No tub baths / swimming for at least 2 weeks.  If you notice any fevers, chills, increased pain, swelling, bleeding or pus,  please contact your doctor.   ADDITIONAL INFORMATION  If you are going to have an upcoming dental procedure, please contact our office as you will require antibiotics ahead of time to prevent infection on your heart valve.    If you have any questions or concerns you can call the structural heart phone during normal business hours 8am-4pm. If you have an urgent need after hours or weekends please call [phone] to talk to the on call provider for general cardiology. If you have an emergency that requires immediate attention, please call 911.    After TAVR Checklist  Check  Test Description  Follow up appointment in 1-2 weeks  You will see our structural heart advanced practice provider. Your incision sites will be checked and you will be cleared to drive and resume all normal activities if you are doing well.    1 month echo and follow up  You will have an echo to check on your new heart valve and be seen back in the office by a structural heart advanced practice provider.  Follow up with your primary cardiologist You will need to be seen by your primary cardiologist in the following 3-6 months after your 1 month appointment in the valve clinic.   1 year echo and follow up You will have another echo to check on your heart valve after 1 year and be seen back in the office by a structural heart advanced practice provider. This your last structural heart visit.  Bacterial endocarditis prophylaxis  You will have to take antibiotics for the rest of your life before all dental procedures (even teeth cleanings) to protect your heart valve. Antibiotics are also required before some surgeries. Please check with your cardiologist before scheduling any surgeries. Also, please make sure to tell us if you have a penicillin allergy as you will require an alternative antibiotic.   Discharge wound care:   Complete by: As directed    Can remove dressings tomorrow and cover area with BandAid or leave open to  air.   Increase activity slowly   Complete by: As directed       Discharge Medications   Allergies as of 04/24/2023       Reactions   Flecainide Other (See Comments)   dizziness, shortness of breath, blurred vision   Metoprolol Tartrate Itching   Rofecoxib Rash, Other (See Comments)   (Vioxx)        Medication List     STOP taking these medications    amoxicillin-clavulanate 875-125 MG tablet Commonly known as: AUGMENTIN       TAKE these medications    apixaban 5 MG Tabs tablet Commonly known as: ELIQUIS Take 1 tablet (5 mg total) by mouth 2 (two) times daily.   ascorbic acid 1000 MG tablet Commonly known as: VITAMIN C Take 1 tablet (1,000 mg total) by mouth daily. What changed: additional instructions   atorvastatin 80 MG tablet Commonly known as: LIPITOR Take 1 tablet by mouth once daily   Farxiga 10 MG Tabs tablet Generic drug: dapagliflozin propanediol Take 10 mg by mouth in the morning.   midodrine 2.5 MG tablet Commonly known as: PROAMATINE Take 1 tablet (2.5 mg total) by mouth 3 (three) times  daily with meals.   multivitamin with minerals Tabs tablet Take 1 tablet by mouth daily.   neomycin-bacitracin-polymyxin [phone] Oint Apply 1 Application topically 3 (three) times daily as needed (wounds/bruises).   nitroGLYCERIN 0.4 MG SL tablet Commonly known as: NITROSTAT Place 1 tablet (0.4 mg total) under the tongue every 5 (five) minutes x 3 doses as needed for chest pain.   potassium chloride SA 20 MEQ tablet Commonly known as: Klor-Con M20 Take 1 tablet (20 mEq total) by mouth daily. What changed: when to take this   Spiriva Respimat 1.25 MCG/ACT Aers Generic drug: Tiotropium Bromide Monohydrate Inhale 2 puffs into the lungs daily. What changed:  when to take this reasons to take this   spironolactone 25 MG tablet Commonly known as: ALDACTONE Take 25 mg by mouth daily.   Tolak 4 % Crea Generic drug: Fluorouracil Apply 1  Application topically at bedtime.   torsemide 20 MG tablet Commonly known as: DEMADEX Take 3 tablets (60 mg total) by mouth 2 (two) times daily.               Discharge Care Instructions  (From admission, onward)           Start     Ordered   04/24/23 0000  Discharge wound care:       Comments: Can remove dressings tomorrow and cover area with BandAid or leave open to air.   04/24/23 1316           Outstanding Labs/Studies   None   Duration of Discharge Encounter   Greater than 30 minutes including physician time.  SignedGeorgie Chard, NP 04/24/2023, 1:16 PM [phone]   Chart reviewed, patient examined, agree with above. He has remained hemodynamically stable. Right groin site looks ok this am. Echo looks fine. Mean AV grad 5 with no leak. Plan home today.

## 2023-04-23 NOTE — Progress Notes (Signed)
Called [place], [place] to advise that patient is having MAPs in the upper 50s to low 60s.  Albumin was ordered.

## 2023-04-23 NOTE — Anesthesia Postprocedure Evaluation (Signed)
Anesthesia Post Note  Patient: Ryan Garner  Procedure(s) Performed: Transcatheter Aortic Valve Replacement, Transfemoral INTRAOPERATIVE TRANSTHORACIC ECHOCARDIOGRAM     Patient location during evaluation: Cath Lab Anesthesia Type: MAC Level of consciousness: awake Pain management: pain level controlled Vital Signs Assessment: post-procedure vital signs reviewed and stable Respiratory status: spontaneous breathing, nonlabored ventilation and respiratory function stable Cardiovascular status: blood pressure returned to baseline and stable Postop Assessment: no apparent nausea or vomiting Anesthetic complications: no   There were no known notable events for this encounter.  Last Vitals:  Vitals:   04/23/23 1455 04/23/23 1500  BP: 95/64 (!) 84/56  Pulse: 65 60  Resp: (!) 26 (!) 26  Temp:    SpO2: 92% 94%    Last Pain:  Vitals:   04/23/23 1350  TempSrc: Temporal  PainSc:                  Aleese Kamps P Dorthey Depace

## 2023-04-24 ENCOUNTER — Inpatient Hospital Stay (HOSPITAL_COMMUNITY): Payer: Medicare Other

## 2023-04-24 ENCOUNTER — Encounter (HOSPITAL_COMMUNITY): Payer: Self-pay | Admitting: Cardiovascular Disease

## 2023-04-24 DIAGNOSIS — Z952 Presence of prosthetic heart valve: Secondary | ICD-10-CM | POA: Diagnosis not present

## 2023-04-24 DIAGNOSIS — Z954 Presence of other heart-valve replacement: Secondary | ICD-10-CM | POA: Diagnosis not present

## 2023-04-24 DIAGNOSIS — I272 Pulmonary hypertension, unspecified: Secondary | ICD-10-CM | POA: Diagnosis not present

## 2023-04-24 DIAGNOSIS — Z006 Encounter for examination for normal comparison and control in clinical research program: Secondary | ICD-10-CM | POA: Diagnosis not present

## 2023-04-24 DIAGNOSIS — S3012XA Contusion of groin, initial encounter: Secondary | ICD-10-CM | POA: Insufficient documentation

## 2023-04-24 DIAGNOSIS — S301XXA Contusion of abdominal wall, initial encounter: Secondary | ICD-10-CM | POA: Insufficient documentation

## 2023-04-24 DIAGNOSIS — I060 Rheumatic aortic stenosis: Secondary | ICD-10-CM | POA: Diagnosis not present

## 2023-04-24 DIAGNOSIS — I350 Nonrheumatic aortic (valve) stenosis: Secondary | ICD-10-CM | POA: Diagnosis not present

## 2023-04-24 DIAGNOSIS — I130 Hypertensive heart and chronic kidney disease with heart failure and stage 1 through stage 4 chronic kidney disease, or unspecified chronic kidney disease: Secondary | ICD-10-CM | POA: Diagnosis not present

## 2023-04-24 LAB — ECHOCARDIOGRAM COMPLETE
AR max vel: 2.32 cm2
AV Area VTI: 2.43 cm2
AV Area mean vel: 2.19 cm2
AV Mean grad: 4.3 mm[Hg]
AV Peak grad: 8.7 mm[Hg]
Ao pk vel: 1.47 m/s
Area-P 1/2: 2.1 cm2
Height: 67 in
MV M vel: 4.73 m/s
MV Peak grad: 89.5 mm[Hg]
S' Lateral: 3.1 cm
Weight: 3929.48 [oz_av]

## 2023-04-24 LAB — BASIC METABOLIC PANEL
Anion gap: 12 (ref 5–15)
BUN: 38 mg/dL — ABNORMAL HIGH (ref 8–23)
CO2: 22 mmol/L (ref 22–32)
Calcium: 9.4 mg/dL (ref 8.9–10.3)
Chloride: 101 mmol/L (ref 98–111)
Creatinine, Ser: 1.86 mg/dL — ABNORMAL HIGH (ref 0.61–1.24)
GFR, Estimated: 36 mL/min — ABNORMAL LOW (ref >60)
Glucose, Bld: 106 mg/dL — ABNORMAL HIGH (ref 70–99)
Potassium: 3.6 mmol/L (ref 3.5–5.1)
Sodium: 135 mmol/L (ref 135–145)

## 2023-04-24 LAB — CBC
HCT: 36 % — ABNORMAL LOW (ref 39.0–52.0)
Hemoglobin: 11.6 g/dL — ABNORMAL LOW (ref 13.0–17.0)
MCH: 29.4 pg (ref 26.0–34.0)
MCHC: 32.2 g/dL (ref 30.0–36.0)
MCV: 91.4 fL (ref 80.0–100.0)
Platelets: 225 10*3/uL (ref 150–400)
RBC: 3.94 MIL/uL — ABNORMAL LOW (ref 4.22–5.81)
RDW: 13.2 % (ref 11.5–15.5)
WBC: 8.8 10*3/uL (ref 4.0–10.5)
nRBC: 0 % (ref 0.0–0.2)

## 2023-04-24 LAB — MAGNESIUM: Magnesium: 2 mg/dL (ref 1.7–2.4)

## 2023-04-24 LAB — POCT ACTIVATED CLOTTING TIME: Activated Clotting Time: 305 s

## 2023-04-24 NOTE — Progress Notes (Signed)
Mobility Specialist Progress Note:   04/24/23 1125  Mobility  Activity Ambulated with assistance in hallway  Level of Assistance Contact guard assist, steadying assist  Assistive Device None  Distance Ambulated (ft) 160 ft  Activity Response Tolerated well  Mobility Referral Yes  $Mobility charge 1 Mobility  Mobility Specialist Start Time (ACUTE ONLY) 1109  Mobility Specialist Stop Time (ACUTE ONLY) 1119  Mobility Specialist Time Calculation (min) (ACUTE ONLY) 10 min   During Mobility: 117-136 HR  Post Mobility: 85 HR  Pt received EOB, stated that he just walked but agreeable to walk again. Pt able to ambulate in hallway with CG for safety. Elevated HR during ambulation. NT notified. Pt denied any discomfort during session, asymptomatic throughout. Pt returned to bed with call bell in reach and all needs met.   Leory Plowman  Mobility Specialist Please contact via [employer] office at [phone]

## 2023-04-24 NOTE — Progress Notes (Signed)
Patient given discharge instructions. PIVs removed. Telemetry box removed, CCMD notified. Patient taken to vehicle in wheelchair by staff.  Nickalaus Crooke L Kenae Lindquist, RN    

## 2023-04-24 NOTE — Progress Notes (Signed)
CARDIAC REHAB PHASE I   PRE:  Rate/Rhythm: 85 A-fib  BP:  Sitting: 124/64      SaO2: 95 RA  MODE:  Ambulation: 160 ft   AD:   no AD  POST:  Rate/Rhythm: 126 A-fib  BP:  Sitting: 144/72      SaO2: 95 RA  Pt ambulated with standby assistance, there was some unsteadiness while returning the room however pt walked back without physical assistance.   Pt educated on restrictions, HH diet, exercise guidelines and CRP2. Pt declined CR.  Faustino Congress  ACSM-CEP 11:11 AM 04/24/2023

## 2023-04-24 NOTE — Progress Notes (Signed)
Mobility Specialist Progress Note:   04/24/23 1137  Mobility  Activity Ambulated with assistance to bathroom  Level of Assistance Standby assist, set-up cues, supervision of patient - no hands on  Assistive Device None  Distance Ambulated (ft) 15 ft  Activity Response Tolerated well  Mobility Referral Yes  $Mobility charge 1 Mobility  Mobility Specialist Start Time (ACUTE ONLY) 1125  Mobility Specialist Stop Time (ACUTE ONLY) 1130  Mobility Specialist Time Calculation (min) (ACUTE ONLY) 5 min   Pt found EOB requesting assistance to BR. No hands on assistance needed. Pt left in BR and reminded to use call bell when needed. RN notified.   Leory Plowman  Mobility Specialist Please contact via [employer] office at [phone]

## 2023-04-24 NOTE — TOC Transition Note (Signed)
Transition of Care (TOC) - CM/SW Discharge Note Donn Pierini RN, BSN Transitions of Care Unit 4E- RN Case Manager See Treatment Team for direct phone #   Patient Details  Name: Ryan Garner MRN: [id number] Date of Birth: 1945/09/12  Transition of Care [facility]) CM/SW Contact:  Darrold Span, RN Phone Number: 04/24/2023, 1:49 PM   Clinical Narrative:    Pt stable for transition home today s/p TAVR. No HH or DME needs noted. Family to transport home.   Pt to follow up as per AVS instructions. No further TOC needs noted.    Final next level of care: Home/Self Care Barriers to Discharge: No Barriers Identified   Patient Goals and CMS Choice   Choice offered to / list presented to : NA  Discharge Placement                 Home        Discharge Plan and Services Additional resources added to the After Visit Summary for     Discharge Planning Services: NA Post Acute Care Choice: NA          DME Arranged: N/A DME Agency: NA       HH Arranged: NA HH Agency: NA        Social Determinants of Health (SDOH) Interventions SDOH Screenings   Food Insecurity: No Food Insecurity (10/19/2022)  Housing: Low Risk  (10/14/2022)  Transportation Needs: No Transportation Needs (10/19/2022)  Utilities: Not At Risk (10/19/2022)  Alcohol Screen: Low Risk  (07/04/2022)  Depression (PHQ2-9): Low Risk  (04/15/2023)  Financial Resource Strain: Low Risk  (10/19/2022)  Physical Activity: Inactive (10/14/2022)  Social Connections: Moderately Integrated (10/14/2022)  Stress: No Stress Concern Present (10/19/2022)  Tobacco Use: Medium Risk (04/23/2023)     Readmission Risk Interventions    04/24/2023    1:49 PM  Readmission Risk Prevention Plan  Transportation Screening Complete  Home Care Screening Complete  Medication Review (RN CM) Complete

## 2023-04-25 ENCOUNTER — Telehealth: Payer: Self-pay | Admitting: Cardiology

## 2023-04-25 ENCOUNTER — Other Ambulatory Visit: Payer: Self-pay | Admitting: Nurse Practitioner

## 2023-04-25 NOTE — Telephone Encounter (Signed)
  [facility]   MULTIDISCIPLINARY HEART VALVE TEAM   Patient contacted regarding discharge from [facility] on 04/24/23 and reports he is doing well with no concerns. Groin sites are stable.    Patient understands to follow up with provider Georgie Chard  [address]  Patient understands discharge instructions?  Patient understands medications and regimen?  Patient understands to bring all medications to this visit?   Georgie Chard NP-C Structural Heart Team  Pager: [phone]

## 2023-04-25 NOTE — Telephone Encounter (Signed)
  [facility]   MULTIDISCIPLINARY HEART VALVE TEAM    Patient contacted regarding discharge from [facility] on 04/24/23 and reports he is doing well with no concerns. Groin sites are stable.    Patient understands to follow up with provider Georgie Chard on 10/14.  Patient understands discharge instructions? Yes  Patient understands medications and regimen? Yes  Patient understands to bring all medications to this visit? Yes    Georgie Chard NP-C Structural Heart Team  Pager: [phone]

## 2023-05-03 NOTE — Progress Notes (Signed)
Diag lesion is 60% stenosed.  Ramus Intermedius Ramus lesion is 40% stenosed.  Left Circumflex Non-stenotic Mid Cx lesion was previously treated.  First Obtuse Marginal Branch 1st Mrg lesion is 70% stenosed.  Right Coronary Artery Vessel is normal in caliber. There is mild diffuse disease throughout the vessel. Mid RCA lesion is 30%  stenosed.  Intervention  No interventions have been documented.   CARDIAC CATHETERIZATION  CARDIAC CATHETERIZATION 12/26/2022  Narrative 1. Mildly elevated PCWP and RA pressure. 2. Severe mixed pulmonary hypertension, suspect pulmonary arterial hypertension in setting of long-standing severe MR with vascular remodeling as well as pulmonary venous hypertension and high output PH.  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     ECHOCARDIOGRAM  ECHOCARDIOGRAM COMPLETE 04/24/2023  Narrative ECHOCARDIOGRAM REPORT    Patient Name:   Ryan Garner Date of Exam: 04/24/2023 Medical Rec #:  [id number]      Height:       67.0 in Accession #:    [id number]     Weight:       245.6 lb Date of Birth:  1946/03/03       BSA:          2.207 m Patient Age:    77 years       BP:           104/60 mmHg Patient Gender: M              HR:           78 bpm. Exam Location:  Inpatient  Procedure: 2D Echo, Cardiac Doppler and Color Doppler  Indications:    Post TAVR evaluation V43.3 / Z95.2  History:        Patient has prior history of Echocardiogram examinations, most recent 10/10/2022. CHF, CAD, Mitral Valve Disease and Aortic Valve Disease, Arrythmias:Atrial Fibrillation; Risk Factors:Diabetes and Hypertension.  Sonographer:    Harriette Bouillon RDCS Referring Phys: [id number] KATHRYN R THOMPSON  IMPRESSIONS   1. Left ventricular ejection fraction, by estimation, is 60 to 65%. The left ventricle has normal function. The left ventricle has no regional wall motion abnormalities. Left ventricular diastolic parameters are indeterminate. 2. Right ventricular systolic function is moderately reduced. The right ventricular size is moderately enlarged. There is severely elevated pulmonary artery systolic pressure. 3. Left atrial size was severely dilated. 4. Right atrial size was severely dilated. 5. Post TEER with 2 Pascal devices mild to moderate  residual MR . The mitral valve has been repaired/replaced. Mild to moderate mitral valve regurgitation. No evidence of mitral stenosis. 6. Tricuspid valve regurgitation is severe. 7. Post TAVR with 29 mm Sapien 3 valve no PVL mean gradient 4.3 mmHg peak 8.7 mmHg AVA 2.3 cm2 . The aortic valve has been repaired/replaced. Aortic valve regurgitation is not visualized. No aortic stenosis is present. 8. Aortic dilatation noted. There is mild dilatation of the ascending aorta, measuring 41 mm. 9. The inferior vena cava is normal in size with greater than 50% respiratory variability, suggesting right atrial pressure of 3 mmHg.  FINDINGS Left Ventricle: Left ventricular ejection fraction, by estimation, is 60 to 65%. The left ventricle has normal function. The left ventricle has no regional wall motion abnormalities. The left ventricular internal cavity size was normal in size. There is no left ventricular hypertrophy. Left ventricular diastolic parameters are indeterminate.  Right Ventricle: The right ventricular size is moderately enlarged. No increase in right ventricular wall thickness. Right ventricular systolic  LVOT Vmean:        57.100 cm/s LVOT VTI:          0.164 m LVOT/AV VTI ratio: 0.64  AORTA Ao Root diam: 3.20 cm Ao Asc diam:  4.10  cm  MITRAL VALVE                TRICUSPID VALVE MV Area (PHT): 2.10 cm     TR Peak grad:   83.9 mmHg MV Decel Time: 361 msec     TR Vmax:        458.00 cm/s MR Peak grad: 89.5 mmHg MR Mean grad: 69.0 mmHg     SHUNTS MR Vmax:      473.00 cm/s   Systemic VTI:  0.16 m MR Vmean:     402.0 cm/s    Systemic Diam: 2.20 cm MV E velocity: 171.00 cm/s  Charlton Haws MD Electronically signed by Charlton Haws MD Signature Date/Time: 04/24/2023/9:51:05 AM    Final   TEE  ECHO TEE 12/26/2022  Narrative TRANSESOPHOGEAL ECHO REPORT    Patient Name:   Ryan Garner Date of Exam: 12/26/2022 Medical Rec #:  [id number]      Height:       68.0 in Accession #:    [id number]     Weight:       222.0 lb Date of Birth:  1945-12-05       BSA:          2.136 m Patient Age:    76 years       BP:           99/54 mmHg Patient Gender: M              HR:           67 bpm. Exam Location:  Inpatient  Procedure: Transesophageal Echo, Color Doppler and Cardiac Doppler  Indications:     Aortic Stenosis i35.0  History:         Patient has prior history of Echocardiogram examinations, most recent 11/16/2022. CHF, COPD; Risk Factors:Hypertension, Diabetes, Dyslipidemia and Sleep Apnea. 2 Pascal Mitral TEERs placed A2P2 06/01/23.  Sonographer:     Irving Burton Senior RDCS Referring Phys:  [id number] [facility] Diagnosing Phys: Wilfred Lacy  PROCEDURE: After discussion of the risks and benefits of a TEE, an informed consent was obtained from the patient. The transesophogeal probe was passed without difficulty through the esophogus of the patient. Sedation performed by different physician. The patient was monitored while under deep sedation. Anesthestetic sedation was provided intravenously by Anesthesiology: 231mg  of Propofol, 60mg  of Lidocaine. The patient developed no complications during the procedure.  IMPRESSIONS   1. Left ventricular ejection fraction, by estimation, is 45 to 50%. The left ventricle has mildly  decreased function. The left ventricle demonstrates global hypokinesis. There is mild left ventricular hypertrophy. 2. D-shaped interventricular septum suggests a degree of RV pressure/volume overload. Peak RV-RA gradient 59 mmHg. Right ventricular systolic function is moderately reduced. The right ventricular size is moderately enlarged. 3. Left atrial size was severely dilated. No left atrial/left atrial appendage thrombus was detected. 4. Right atrial size was severely dilated. 5. 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 6. S/p MV repair via mTEER with 2 Pascal devices that are well-seated. There is moderate residual mitral regurgitation. There is no systolic flow reversal on pulmonary vein doppler interrogation. Mild mitral stenosis. The mean mitral valve gradient is 5.0 mmHg. 7. There is severe AS, 0.4 cm^2 by planimetry and 0.88 cm^2  LVOT Vmean:        57.100 cm/s LVOT VTI:          0.164 m LVOT/AV VTI ratio: 0.64  AORTA Ao Root diam: 3.20 cm Ao Asc diam:  4.10  cm  MITRAL VALVE                TRICUSPID VALVE MV Area (PHT): 2.10 cm     TR Peak grad:   83.9 mmHg MV Decel Time: 361 msec     TR Vmax:        458.00 cm/s MR Peak grad: 89.5 mmHg MR Mean grad: 69.0 mmHg     SHUNTS MR Vmax:      473.00 cm/s   Systemic VTI:  0.16 m MR Vmean:     402.0 cm/s    Systemic Diam: 2.20 cm MV E velocity: 171.00 cm/s  Charlton Haws MD Electronically signed by Charlton Haws MD Signature Date/Time: 04/24/2023/9:51:05 AM    Final   TEE  ECHO TEE 12/26/2022  Narrative TRANSESOPHOGEAL ECHO REPORT    Patient Name:   Ryan Garner Date of Exam: 12/26/2022 Medical Rec #:  [id number]      Height:       68.0 in Accession #:    [id number]     Weight:       222.0 lb Date of Birth:  1945-12-05       BSA:          2.136 m Patient Age:    76 years       BP:           99/54 mmHg Patient Gender: M              HR:           67 bpm. Exam Location:  Inpatient  Procedure: Transesophageal Echo, Color Doppler and Cardiac Doppler  Indications:     Aortic Stenosis i35.0  History:         Patient has prior history of Echocardiogram examinations, most recent 11/16/2022. CHF, COPD; Risk Factors:Hypertension, Diabetes, Dyslipidemia and Sleep Apnea. 2 Pascal Mitral TEERs placed A2P2 06/01/23.  Sonographer:     Irving Burton Senior RDCS Referring Phys:  [id number] [facility] Diagnosing Phys: Wilfred Lacy  PROCEDURE: After discussion of the risks and benefits of a TEE, an informed consent was obtained from the patient. The transesophogeal probe was passed without difficulty through the esophogus of the patient. Sedation performed by different physician. The patient was monitored while under deep sedation. Anesthestetic sedation was provided intravenously by Anesthesiology: 231mg  of Propofol, 60mg  of Lidocaine. The patient developed no complications during the procedure.  IMPRESSIONS   1. Left ventricular ejection fraction, by estimation, is 45 to 50%. The left ventricle has mildly  decreased function. The left ventricle demonstrates global hypokinesis. There is mild left ventricular hypertrophy. 2. D-shaped interventricular septum suggests a degree of RV pressure/volume overload. Peak RV-RA gradient 59 mmHg. Right ventricular systolic function is moderately reduced. The right ventricular size is moderately enlarged. 3. Left atrial size was severely dilated. No left atrial/left atrial appendage thrombus was detected. 4. Right atrial size was severely dilated. 5. 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 6. S/p MV repair via mTEER with 2 Pascal devices that are well-seated. There is moderate residual mitral regurgitation. There is no systolic flow reversal on pulmonary vein doppler interrogation. Mild mitral stenosis. The mean mitral valve gradient is 5.0 mmHg. 7. There is severe AS, 0.4 cm^2 by planimetry and 0.88 cm^2  Diag lesion is 60% stenosed.  Ramus Intermedius Ramus lesion is 40% stenosed.  Left Circumflex Non-stenotic Mid Cx lesion was previously treated.  First Obtuse Marginal Branch 1st Mrg lesion is 70% stenosed.  Right Coronary Artery Vessel is normal in caliber. There is mild diffuse disease throughout the vessel. Mid RCA lesion is 30%  stenosed.  Intervention  No interventions have been documented.   CARDIAC CATHETERIZATION  CARDIAC CATHETERIZATION 12/26/2022  Narrative 1. Mildly elevated PCWP and RA pressure. 2. Severe mixed pulmonary hypertension, suspect pulmonary arterial hypertension in setting of long-standing severe MR with vascular remodeling as well as pulmonary venous hypertension and high output PH.  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     ECHOCARDIOGRAM  ECHOCARDIOGRAM COMPLETE 04/24/2023  Narrative ECHOCARDIOGRAM REPORT    Patient Name:   Ryan Garner Date of Exam: 04/24/2023 Medical Rec #:  [id number]      Height:       67.0 in Accession #:    [id number]     Weight:       245.6 lb Date of Birth:  1946/03/03       BSA:          2.207 m Patient Age:    77 years       BP:           104/60 mmHg Patient Gender: M              HR:           78 bpm. Exam Location:  Inpatient  Procedure: 2D Echo, Cardiac Doppler and Color Doppler  Indications:    Post TAVR evaluation V43.3 / Z95.2  History:        Patient has prior history of Echocardiogram examinations, most recent 10/10/2022. CHF, CAD, Mitral Valve Disease and Aortic Valve Disease, Arrythmias:Atrial Fibrillation; Risk Factors:Diabetes and Hypertension.  Sonographer:    Harriette Bouillon RDCS Referring Phys: [id number] KATHRYN R THOMPSON  IMPRESSIONS   1. Left ventricular ejection fraction, by estimation, is 60 to 65%. The left ventricle has normal function. The left ventricle has no regional wall motion abnormalities. Left ventricular diastolic parameters are indeterminate. 2. Right ventricular systolic function is moderately reduced. The right ventricular size is moderately enlarged. There is severely elevated pulmonary artery systolic pressure. 3. Left atrial size was severely dilated. 4. Right atrial size was severely dilated. 5. Post TEER with 2 Pascal devices mild to moderate  residual MR . The mitral valve has been repaired/replaced. Mild to moderate mitral valve regurgitation. No evidence of mitral stenosis. 6. Tricuspid valve regurgitation is severe. 7. Post TAVR with 29 mm Sapien 3 valve no PVL mean gradient 4.3 mmHg peak 8.7 mmHg AVA 2.3 cm2 . The aortic valve has been repaired/replaced. Aortic valve regurgitation is not visualized. No aortic stenosis is present. 8. Aortic dilatation noted. There is mild dilatation of the ascending aorta, measuring 41 mm. 9. The inferior vena cava is normal in size with greater than 50% respiratory variability, suggesting right atrial pressure of 3 mmHg.  FINDINGS Left Ventricle: Left ventricular ejection fraction, by estimation, is 60 to 65%. The left ventricle has normal function. The left ventricle has no regional wall motion abnormalities. The left ventricular internal cavity size was normal in size. There is no left ventricular hypertrophy. Left ventricular diastolic parameters are indeterminate.  Right Ventricle: The right ventricular size is moderately enlarged. No increase in right ventricular wall thickness. Right ventricular systolic  Diag lesion is 60% stenosed.  Ramus Intermedius Ramus lesion is 40% stenosed.  Left Circumflex Non-stenotic Mid Cx lesion was previously treated.  First Obtuse Marginal Branch 1st Mrg lesion is 70% stenosed.  Right Coronary Artery Vessel is normal in caliber. There is mild diffuse disease throughout the vessel. Mid RCA lesion is 30%  stenosed.  Intervention  No interventions have been documented.   CARDIAC CATHETERIZATION  CARDIAC CATHETERIZATION 12/26/2022  Narrative 1. Mildly elevated PCWP and RA pressure. 2. Severe mixed pulmonary hypertension, suspect pulmonary arterial hypertension in setting of long-standing severe MR with vascular remodeling as well as pulmonary venous hypertension and high output PH.  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     ECHOCARDIOGRAM  ECHOCARDIOGRAM COMPLETE 04/24/2023  Narrative ECHOCARDIOGRAM REPORT    Patient Name:   Ryan Garner Date of Exam: 04/24/2023 Medical Rec #:  [id number]      Height:       67.0 in Accession #:    [id number]     Weight:       245.6 lb Date of Birth:  1946/03/03       BSA:          2.207 m Patient Age:    77 years       BP:           104/60 mmHg Patient Gender: M              HR:           78 bpm. Exam Location:  Inpatient  Procedure: 2D Echo, Cardiac Doppler and Color Doppler  Indications:    Post TAVR evaluation V43.3 / Z95.2  History:        Patient has prior history of Echocardiogram examinations, most recent 10/10/2022. CHF, CAD, Mitral Valve Disease and Aortic Valve Disease, Arrythmias:Atrial Fibrillation; Risk Factors:Diabetes and Hypertension.  Sonographer:    Harriette Bouillon RDCS Referring Phys: [id number] KATHRYN R THOMPSON  IMPRESSIONS   1. Left ventricular ejection fraction, by estimation, is 60 to 65%. The left ventricle has normal function. The left ventricle has no regional wall motion abnormalities. Left ventricular diastolic parameters are indeterminate. 2. Right ventricular systolic function is moderately reduced. The right ventricular size is moderately enlarged. There is severely elevated pulmonary artery systolic pressure. 3. Left atrial size was severely dilated. 4. Right atrial size was severely dilated. 5. Post TEER with 2 Pascal devices mild to moderate  residual MR . The mitral valve has been repaired/replaced. Mild to moderate mitral valve regurgitation. No evidence of mitral stenosis. 6. Tricuspid valve regurgitation is severe. 7. Post TAVR with 29 mm Sapien 3 valve no PVL mean gradient 4.3 mmHg peak 8.7 mmHg AVA 2.3 cm2 . The aortic valve has been repaired/replaced. Aortic valve regurgitation is not visualized. No aortic stenosis is present. 8. Aortic dilatation noted. There is mild dilatation of the ascending aorta, measuring 41 mm. 9. The inferior vena cava is normal in size with greater than 50% respiratory variability, suggesting right atrial pressure of 3 mmHg.  FINDINGS Left Ventricle: Left ventricular ejection fraction, by estimation, is 60 to 65%. The left ventricle has normal function. The left ventricle has no regional wall motion abnormalities. The left ventricular internal cavity size was normal in size. There is no left ventricular hypertrophy. Left ventricular diastolic parameters are indeterminate.  Right Ventricle: The right ventricular size is moderately enlarged. No increase in right ventricular wall thickness. Right ventricular systolic  [facility]   MULTIDISCIPLINARY HEART VALVE TEAM  Structural Heart Office Note:  .    Date:  05/07/2023  ID:  Ryan Garner, DOB [birth year], MRN [id number] PCP: [name], [name], MD  [facility] Providers Cardiologist:  [name], MD {/ Dr. Clifton James, MD and Dr. Laneta Simmers, MD (TAVR)   History of Present Illness: .   KEASTON PILE is a 77 y.o. male with a history of HTN, HLD, CAD s/p PCI to LCx (2021), HFpEF, persistent atrial fibrillation on Eliquis, CKD stage IV, HFrEF severe fMR s/p mTEER with Pascal x2 (10/10/22), and paradoxical LFLG AS s/p TAVR who presents today for TOC follow up.    Mr. Harb was admitted 2/21-3/21/24 for acute HFpEF requiring dobutamine for RV support and diuresed over 90lbs found to have severe functional mitral regurgitation and underwent mitral transcatheter edge-to-edge repair with PASCAL ACE (x2) in on 10/11/22. Post procedure was complicated by a rectus sheath hematoma. He has done well in follow up but noted to have worsening heart failure more recently. Follow up TTE on 11/16/2022 showed EF 50-55%, stable mitral clips and possible LFLG AS. Follow up TEE 12/26/22 showed EF 45%, moderate RV dysfunction, severe AS with mean grad  27 mmHg, peak grad 41.3 mmHg, AVA 0.9 cm2, DVI  0.20, SVI 30, s/p mTEER with moderate residual MR. [facility] 02/05/23 showed moderate stenosis in the D1 and OM1 with patient LCX stent and no interventional targets as well as mildly elevated PCWP with prominent v-waves consistent with known moderate residual MR post-Mitraclips.   He was evaluated by the multidisciplinary valve team and felt to have severe, symptomatic aortic stenosis and to be a suitable candidate and is now s/p successful TAVR with a 29 mm Edwards Sapien 3 Ultra Resilia THV via the TF approach on 04/23/23. Post operative echo with stable mTEER clips with mild to moderate MR, severe TR, and stable TAVR 29mm S3UR with no PVL and stable gradients with a mean of  4.73mmHg, peak 8.65mmHg, and AVA 2.3cm2.  He was resumed on home Eliquis 5mg  BID.   Today he presents alone and reports he has been doing very weill with no complaints of chest pain, SOB, palpitations, LE edema, orthopnea, PND, dizziness, or syncope. Denies bleeding in stool or urine. Groin sites remain stable. Reports breathing and energy have both improved since his procedure. Asking when he can begin driving.  Studies Reviewed: .   Cardiac Studies & Procedures   CARDIAC CATHETERIZATION  CARDIAC CATHETERIZATION 02/05/2023  Narrative   Mid RCA lesion is 30% stenosed.   Ramus lesion is 40% stenosed.   1st Diag lesion is 60% stenosed.   1st Mrg lesion is 70% stenosed.   Non-stenotic Mid Cx lesion was previously treated.  1. Normal RA pressure. 2. Mixed pulmonary venous/pulmonary arterial hypertension. Suspect patient has had pulmonary vascular remodeling with long-standing MR leading to a component of pulmonary arterial hypertension. 3. Mildly elevated PCWP with prominent v-waves consistent with known moderate residual MR post-Mitraclips. 4. Severe aortic stenosis by imaging with mean gradient 30 mmHg on this catheterization.  The aortic valve was surprisingly easy to cross. 5. Moderate stenosis in the D1 and OM1 with patient LCX stent.  No interventional target.  Continue TAVR evaluation.  Findings Coronary Findings Diagnostic  Dominance: Right  Left Main Vessel is normal in caliber. Vessel is angiographically normal.  Left Anterior Descending Vessel is large. There is mild diffuse disease throughout the vessel.  First Product manager 1st  LVOT Vmean:        57.100 cm/s LVOT VTI:          0.164 m LVOT/AV VTI ratio: 0.64  AORTA Ao Root diam: 3.20 cm Ao Asc diam:  4.10  cm  MITRAL VALVE                TRICUSPID VALVE MV Area (PHT): 2.10 cm     TR Peak grad:   83.9 mmHg MV Decel Time: 361 msec     TR Vmax:        458.00 cm/s MR Peak grad: 89.5 mmHg MR Mean grad: 69.0 mmHg     SHUNTS MR Vmax:      473.00 cm/s   Systemic VTI:  0.16 m MR Vmean:     402.0 cm/s    Systemic Diam: 2.20 cm MV E velocity: 171.00 cm/s  Charlton Haws MD Electronically signed by Charlton Haws MD Signature Date/Time: 04/24/2023/9:51:05 AM    Final   TEE  ECHO TEE 12/26/2022  Narrative TRANSESOPHOGEAL ECHO REPORT    Patient Name:   Ryan Garner Date of Exam: 12/26/2022 Medical Rec #:  [id number]      Height:       68.0 in Accession #:    [id number]     Weight:       222.0 lb Date of Birth:  1945-12-05       BSA:          2.136 m Patient Age:    76 years       BP:           99/54 mmHg Patient Gender: M              HR:           67 bpm. Exam Location:  Inpatient  Procedure: Transesophageal Echo, Color Doppler and Cardiac Doppler  Indications:     Aortic Stenosis i35.0  History:         Patient has prior history of Echocardiogram examinations, most recent 11/16/2022. CHF, COPD; Risk Factors:Hypertension, Diabetes, Dyslipidemia and Sleep Apnea. 2 Pascal Mitral TEERs placed A2P2 06/01/23.  Sonographer:     Irving Burton Senior RDCS Referring Phys:  [id number] Eliot Ford [facility] Diagnosing Phys: Wilfred Lacy  PROCEDURE: After discussion of the risks and benefits of a TEE, an informed consent was obtained from the patient. The transesophogeal probe was passed without difficulty through the esophogus of the patient. Sedation performed by different physician. The patient was monitored while under deep sedation. Anesthestetic sedation was provided intravenously by Anesthesiology: 231mg  of Propofol, 60mg  of Lidocaine. The patient developed no complications during the procedure.  IMPRESSIONS   1. Left ventricular ejection fraction, by estimation, is 45 to 50%. The left ventricle has mildly  decreased function. The left ventricle demonstrates global hypokinesis. There is mild left ventricular hypertrophy. 2. D-shaped interventricular septum suggests a degree of RV pressure/volume overload. Peak RV-RA gradient 59 mmHg. Right ventricular systolic function is moderately reduced. The right ventricular size is moderately enlarged. 3. Left atrial size was severely dilated. No left atrial/left atrial appendage thrombus was detected. 4. Right atrial size was severely dilated. 5. 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 6. S/p MV repair via mTEER with 2 Pascal devices that are well-seated. There is moderate residual mitral regurgitation. There is no systolic flow reversal on pulmonary vein doppler interrogation. Mild mitral stenosis. The mean mitral valve gradient is 5.0 mmHg. 7. There is severe AS, 0.4 cm^2 by planimetry and 0.88 cm^2  [facility]   MULTIDISCIPLINARY HEART VALVE TEAM  Structural Heart Office Note:  .    Date:  05/07/2023  ID:  Ryan Garner, DOB [birth year], MRN [id number] PCP: [name], [name], MD  [facility] Providers Cardiologist:  [name], MD {/ Dr. Clifton James, MD and Dr. Laneta Simmers, MD (TAVR)   History of Present Illness: .   KEASTON PILE is a 77 y.o. male with a history of HTN, HLD, CAD s/p PCI to LCx (2021), HFpEF, persistent atrial fibrillation on Eliquis, CKD stage IV, HFrEF severe fMR s/p mTEER with Pascal x2 (10/10/22), and paradoxical LFLG AS s/p TAVR who presents today for TOC follow up.    Mr. Harb was admitted 2/21-3/21/24 for acute HFpEF requiring dobutamine for RV support and diuresed over 90lbs found to have severe functional mitral regurgitation and underwent mitral transcatheter edge-to-edge repair with PASCAL ACE (x2) in on 10/11/22. Post procedure was complicated by a rectus sheath hematoma. He has done well in follow up but noted to have worsening heart failure more recently. Follow up TTE on 11/16/2022 showed EF 50-55%, stable mitral clips and possible LFLG AS. Follow up TEE 12/26/22 showed EF 45%, moderate RV dysfunction, severe AS with mean grad  27 mmHg, peak grad 41.3 mmHg, AVA 0.9 cm2, DVI  0.20, SVI 30, s/p mTEER with moderate residual MR. [facility] 02/05/23 showed moderate stenosis in the D1 and OM1 with patient LCX stent and no interventional targets as well as mildly elevated PCWP with prominent v-waves consistent with known moderate residual MR post-Mitraclips.   He was evaluated by the multidisciplinary valve team and felt to have severe, symptomatic aortic stenosis and to be a suitable candidate and is now s/p successful TAVR with a 29 mm Edwards Sapien 3 Ultra Resilia THV via the TF approach on 04/23/23. Post operative echo with stable mTEER clips with mild to moderate MR, severe TR, and stable TAVR 29mm S3UR with no PVL and stable gradients with a mean of  4.73mmHg, peak 8.65mmHg, and AVA 2.3cm2.  He was resumed on home Eliquis 5mg  BID.   Today he presents alone and reports he has been doing very weill with no complaints of chest pain, SOB, palpitations, LE edema, orthopnea, PND, dizziness, or syncope. Denies bleeding in stool or urine. Groin sites remain stable. Reports breathing and energy have both improved since his procedure. Asking when he can begin driving.  Studies Reviewed: .   Cardiac Studies & Procedures   CARDIAC CATHETERIZATION  CARDIAC CATHETERIZATION 02/05/2023  Narrative   Mid RCA lesion is 30% stenosed.   Ramus lesion is 40% stenosed.   1st Diag lesion is 60% stenosed.   1st Mrg lesion is 70% stenosed.   Non-stenotic Mid Cx lesion was previously treated.  1. Normal RA pressure. 2. Mixed pulmonary venous/pulmonary arterial hypertension. Suspect patient has had pulmonary vascular remodeling with long-standing MR leading to a component of pulmonary arterial hypertension. 3. Mildly elevated PCWP with prominent v-waves consistent with known moderate residual MR post-Mitraclips. 4. Severe aortic stenosis by imaging with mean gradient 30 mmHg on this catheterization.  The aortic valve was surprisingly easy to cross. 5. Moderate stenosis in the D1 and OM1 with patient LCX stent.  No interventional target.  Continue TAVR evaluation.  Findings Coronary Findings Diagnostic  Dominance: Right  Left Main Vessel is normal in caliber. Vessel is angiographically normal.  Left Anterior Descending Vessel is large. There is mild diffuse disease throughout the vessel.  First Product manager 1st

## 2023-05-06 ENCOUNTER — Other Ambulatory Visit: Payer: Self-pay | Admitting: Cardiology

## 2023-05-06 ENCOUNTER — Ambulatory Visit: Payer: Medicare Other | Attending: Cardiology | Admitting: Cardiology

## 2023-05-06 VITALS — BP 124/90 | HR 82 | Ht 68.0 in | Wt 250.0 lb

## 2023-05-06 DIAGNOSIS — I4819 Other persistent atrial fibrillation: Secondary | ICD-10-CM | POA: Diagnosis not present

## 2023-05-06 DIAGNOSIS — N184 Chronic kidney disease, stage 4 (severe): Secondary | ICD-10-CM | POA: Diagnosis not present

## 2023-05-06 DIAGNOSIS — E1159 Type 2 diabetes mellitus with other circulatory complications: Secondary | ICD-10-CM

## 2023-05-06 DIAGNOSIS — I340 Nonrheumatic mitral (valve) insufficiency: Secondary | ICD-10-CM | POA: Diagnosis not present

## 2023-05-06 DIAGNOSIS — N1831 Chronic kidney disease, stage 3a: Secondary | ICD-10-CM | POA: Diagnosis not present

## 2023-05-06 DIAGNOSIS — Z95818 Presence of other cardiac implants and grafts: Secondary | ICD-10-CM | POA: Diagnosis not present

## 2023-05-06 DIAGNOSIS — Z9889 Other specified postprocedural states: Secondary | ICD-10-CM

## 2023-05-06 DIAGNOSIS — I2510 Atherosclerotic heart disease of native coronary artery without angina pectoris: Secondary | ICD-10-CM | POA: Diagnosis not present

## 2023-05-06 DIAGNOSIS — N289 Disorder of kidney and ureter, unspecified: Secondary | ICD-10-CM | POA: Diagnosis not present

## 2023-05-06 DIAGNOSIS — I350 Nonrheumatic aortic (valve) stenosis: Secondary | ICD-10-CM | POA: Diagnosis not present

## 2023-05-06 DIAGNOSIS — Z952 Presence of prosthetic heart valve: Secondary | ICD-10-CM

## 2023-05-06 NOTE — Patient Instructions (Signed)
Medication Instructions:  Your physician recommends that you continue on your current medications as directed. Please refer to the Current Medication list given to you today.  *If you need a refill on your cardiac medications before your next appointment, please call your pharmacy*   Lab Work: Your physician recommends that you have lab work today- BMET  If you have labs (blood work) drawn today and your tests are completely normal, you will receive your results only by: MyChart Message (if you have MyChart) OR A paper copy in the mail If you have any lab test that is abnormal or we need to change your treatment, we will call you to review the results.   Testing/Procedures: Renal CT scanning (CAT scanning), is a noninvasive, special x-ray that produces cross-sectional images of the body using x-rays and a computer. CT scans help physicians diagnose and treat medical conditions. For some CT exams, a contrast material is used to enhance visibility in the area of the body being studied. CT scans provide greater clarity and reveal more details than regular x-ray exams.    Follow-Up: At [facility], you and your health needs are our priority.  As part of our continuing mission to provide you with exceptional heart care, we have created designated Provider Care Teams.  These Care Teams include your primary Cardiologist (physician) and Advanced Practice Providers (APPs -  Physician Assistants and Nurse Practitioners) who all work together to provide you with the care you need, when you need it.  We recommend signing up for the patient portal called "MyChart".  Sign up information is provided on this After Visit Summary.  MyChart is used to connect with patients for Virtual Visits (Telemedicine).  Patients are able to view lab/test results, encounter notes, upcoming appointments, etc.  Non-urgent messages can be sent to your provider as well.   To learn more about what you can do with  MyChart, go to [email or web address].    Your next appointment:   05/20/23 with echocardiogram before  Provider:   Georgie Chard, NP

## 2023-05-07 ENCOUNTER — Other Ambulatory Visit: Payer: Self-pay | Admitting: Cardiology

## 2023-05-07 LAB — BASIC METABOLIC PANEL
BUN/Creatinine Ratio: 25 — ABNORMAL HIGH (ref 10–24)
BUN: 56 mg/dL — ABNORMAL HIGH (ref 8–27)
CO2: 22 mmol/L (ref 20–29)
Calcium: 9.8 mg/dL (ref 8.6–10.2)
Chloride: 97 mmol/L (ref 96–106)
Creatinine, Ser: 2.21 mg/dL — ABNORMAL HIGH (ref 0.76–1.27)
Glucose: 78 mg/dL (ref 70–99)
Potassium: 4.3 mmol/L (ref 3.5–5.2)
Sodium: 137 mmol/L (ref 134–144)
eGFR: 30 mL/min/{1.73_m2} — ABNORMAL LOW (ref >59)

## 2023-05-07 MED ORDER — AMOXICILLIN 500 MG PO CAPS
2000.0000 mg | ORAL_CAPSULE | ORAL | 12 refills | Status: DC
Start: 2023-05-07 — End: 2023-08-07

## 2023-05-13 ENCOUNTER — Other Ambulatory Visit: Payer: Self-pay | Admitting: Internal Medicine

## 2023-05-20 ENCOUNTER — Ambulatory Visit: Payer: Medicare Other | Attending: Cardiology | Admitting: Cardiology

## 2023-05-20 ENCOUNTER — Ambulatory Visit (HOSPITAL_BASED_OUTPATIENT_CLINIC_OR_DEPARTMENT_OTHER): Payer: Medicare Other

## 2023-05-20 VITALS — BP 110/66 | HR 83 | Ht 68.0 in | Wt 247.0 lb

## 2023-05-20 DIAGNOSIS — N289 Disorder of kidney and ureter, unspecified: Secondary | ICD-10-CM | POA: Insufficient documentation

## 2023-05-20 DIAGNOSIS — I4819 Other persistent atrial fibrillation: Secondary | ICD-10-CM | POA: Insufficient documentation

## 2023-05-20 DIAGNOSIS — Z952 Presence of prosthetic heart valve: Secondary | ICD-10-CM | POA: Insufficient documentation

## 2023-05-20 DIAGNOSIS — E1159 Type 2 diabetes mellitus with other circulatory complications: Secondary | ICD-10-CM | POA: Insufficient documentation

## 2023-05-20 DIAGNOSIS — Z95818 Presence of other cardiac implants and grafts: Secondary | ICD-10-CM | POA: Diagnosis not present

## 2023-05-20 DIAGNOSIS — I10 Essential (primary) hypertension: Secondary | ICD-10-CM | POA: Insufficient documentation

## 2023-05-20 DIAGNOSIS — I350 Nonrheumatic aortic (valve) stenosis: Secondary | ICD-10-CM | POA: Insufficient documentation

## 2023-05-20 DIAGNOSIS — Z9889 Other specified postprocedural states: Secondary | ICD-10-CM | POA: Insufficient documentation

## 2023-05-20 DIAGNOSIS — I2510 Atherosclerotic heart disease of native coronary artery without angina pectoris: Secondary | ICD-10-CM | POA: Diagnosis not present

## 2023-05-20 DIAGNOSIS — N184 Chronic kidney disease, stage 4 (severe): Secondary | ICD-10-CM | POA: Insufficient documentation

## 2023-05-20 DIAGNOSIS — N1831 Chronic kidney disease, stage 3a: Secondary | ICD-10-CM | POA: Diagnosis not present

## 2023-05-20 DIAGNOSIS — I5030 Unspecified diastolic (congestive) heart failure: Secondary | ICD-10-CM | POA: Insufficient documentation

## 2023-05-20 LAB — ECHOCARDIOGRAM COMPLETE
AR max vel: 2.52 cm2
AV Area VTI: 2.47 cm2
AV Area mean vel: 2.56 cm2
AV Mean grad: 10 mm[Hg]
AV Peak grad: 16.2 mm[Hg]
Ao pk vel: 2.01 m/s
Area-P 1/2: 2.6 cm2
MV M vel: 5.45 m/s
MV Peak grad: 118.8 mm[Hg]
MV VTI: 2.49 cm2
S' Lateral: 3.7 cm

## 2023-05-20 NOTE — Patient Instructions (Signed)
Medication Instructions:  The current medical regimen is effective;  continue present plan and medications.  *If you need a refill on your cardiac medications before your next appointment, please call your pharmacy*   Follow-Up: At [facility], you and your health needs are our priority.  As part of our continuing mission to provide you with exceptional heart care, we have created designated Provider Care Teams.  These Care Teams include your primary Cardiologist (physician) and Advanced Practice Providers (APPs -  Physician Assistants and Nurse Practitioners) who all work together to provide you with the care you need, when you need it.  We recommend signing up for the patient portal called "MyChart".  Sign up information is provided on this After Visit Summary.  MyChart is used to connect with patients for Virtual Visits (Telemedicine).  Patients are able to view lab/test results, encounter notes, upcoming appointments, etc.  Non-urgent messages can be sent to your provider as well.   To learn more about what you can do with MyChart, go to [email or web address].    Your next appointment:   2 month(s)  Provider:   Dr Shirlee Latch in [facility] Failure [facility]

## 2023-05-21 NOTE — Progress Notes (Signed)
HEART AND VASCULAR CENTER   MULTIDISCIPLINARY HEART VALVE TEAM  Structural Heart Office Note:  .    Date:  05/21/2023  ID:  [name], DOB Jun 18, 1946, MRN [id number] PCP: [name], [name], MD  [facility] Providers Cardiologist:  [name], MD/ Dr. Clifton James, MD and Dr. Silvano Bilis, MD (TAVR)  History of Present Illness: .    Ryan Garner is a 77 y.o. male with a history of HTN, HLD, CAD s/p PCI to LCx (2021), HFpEF, persistent atrial fibrillation on Eliquis, CKD stage IV, HFrEF severe fMR s/p mTEER with Pascal x2 (10/10/22), and paradoxical LFLG AS s/p TAVR who presents today for one month follow up.    Mr. Segrest was admitted 2/21-3/21/24 for acute HFpEF requiring dobutamine for RV support and diuresed over 90lbs found to have severe functional mitral regurgitation and underwent mitral transcatheter edge-to-edge repair with PASCAL ACE (x2) in on 10/11/22. Post procedure was complicated by a rectus sheath hematoma. He has done well in follow up but noted to have worsening heart failure more recently. Follow up TTE on 11/16/2022 showed EF 50-55%, stable mitral clips and possible LFLG AS. Follow up TEE 12/26/22 showed EF 45%, moderate RV dysfunction, severe AS with mean grad  27 mmHg, peak grad 41.3 mmHg, AVA 0.9 cm2, DVI  0.20, SVI 30, s/p mTEER with moderate residual MR. [facility] 02/05/23 showed moderate stenosis in the D1 and OM1 with patient LCX stent and no interventional targets as well as mildly elevated PCWP with prominent v-waves consistent with known moderate residual MR post-Mitraclips.   He was evaluated by the multidisciplinary valve team and felt to have severe, symptomatic aortic stenosis and to be a suitable candidate and is now s/p successful TAVR with a 29 mm Edwards Sapien 3 Ultra Resilia THV via the TF approach on 04/23/23. Post operative echo with stable mTEER clips with mild to moderate MR, severe TR, and stable TAVR 29mm S3UR with no PVL and stable gradients with a mean of  4.74mmHg, peak 8.33mmHg, and AVA 2.3cm2.  He was resumed on home Eliquis 5mg  BID.   In follow up he was doing well and is here today for one month follow up and continues to do very well since he was last seen. He has had improvements with his breathing and denies chest pain, palpitations, LE edema, orthopnea, PND, dizziness, or syncope. Denies bleeding in stool or urine. Echo today mildly reduced EF at 45-50% with D-shaped septum suggestive of RV pressure/overload (relatively unchanged from prior echo imaging) with stable Pascal Mitral clips in place with moderate residual MR with a mean gradient at and stable 29mm Edwards Sapien TAVR valve with a mean gradient at and AVA at 2.47cm2 with no PVL.   Physical Exam:   VS:  BP 110/66   Pulse 83   Ht 5\' 8"  (1.727 m)   Wt 247 lb (112 kg)   SpO2 99%   BMI 37.56 kg/m    Wt Readings from Last 3 Encounters:  05/20/23 247 lb (112 kg)  05/06/23 250 lb (113.4 kg)  04/24/23 245 lb 9.5 oz (111.4 kg)    General: Well developed, well nourished, NAD Lungs:Clear to ausculation bilaterally. No wheezes, rales, or rhonchi. Breathing is unlabored. Cardiovascular: Irregularly irregular. + flow murmur Extremities: No edema.  Neuro: Alert and oriented. No focal deficits. No facial asymmetry. MAE spontaneously. Psych: Responds to questions appropriately with normal affect.    ASSESSMENT AND PLAN: .    Severe AS: Patient  doing well with NYHA class I symptoms s/p successful TAVR with a 29 mm Edwards Sapien 3 Ultra Resilia THV via the TF approach on 04/23/23. Echo today with mildly reduced EF at 45-50% with D-shaped septum suggestive of RV pressure/overload (relatively unchanged from prior echo imaging) with stable Pascal Mitral clips in place with moderate residual MR with a mean gradient at and stable 29mm Edwards Sapien TAVR valve with a mean gradient at and AVA at 2.47cm2 with no PVL. Continue Eliquis monotherapy. Dental SBE discussed and RXd  at prior follow up. Plan AHF follow up 12/10 and our team 09/2023.   HFpEF: Weight stable (down 3lb since last seen) with no evidence of fluid overload on exam. Echo with D shaped septum. Increasing Torsemide x several days discussed however patient has deferred. Given no symptoms and stable weight, will continue current regimen for now and have him follow with AHF team in 1-2 months.    CAD: [facility] 02/05/23 showed moderate stenosis in the D1 and OM1 with patient LCX stent and no interventional targets. Continue medical therapy. No aspirin given need for OAC. Denies anginal symptoms.    Persistent atrial fibrillation: Continue Eliquis 5mg  BID. Rates controlled.    CKD stage IIIb: Baseline Cr appears to be in the 1.5-2.0 range.   Severe MR s/p mTEER: Moderate residual MR on echo today with stable Pascal mitral clips.    Renal lesions: pre-TAVR CT showed "bilateral indeterminate renal lesions. Recommend further evaluation with contrast-enhanced renal protocol CT or MRI." CT scheduled for 11/11.   Signed, Georgie Chard, NP

## 2023-05-31 ENCOUNTER — Other Ambulatory Visit (HOSPITAL_COMMUNITY): Payer: Self-pay | Admitting: Adult Health

## 2023-06-03 ENCOUNTER — Ambulatory Visit (HOSPITAL_COMMUNITY)
Admission: RE | Admit: 2023-06-03 | Discharge: 2023-06-03 | Disposition: A | Discharge status: Home / Self Care | Payer: Medicare Other | Source: Ambulatory Visit | Attending: Cardiology | Admitting: Cardiology

## 2023-06-03 DIAGNOSIS — Z952 Presence of prosthetic heart valve: Secondary | ICD-10-CM | POA: Insufficient documentation

## 2023-06-03 DIAGNOSIS — N289 Disorder of kidney and ureter, unspecified: Secondary | ICD-10-CM | POA: Diagnosis not present

## 2023-06-03 DIAGNOSIS — N281 Cyst of kidney, acquired: Secondary | ICD-10-CM | POA: Diagnosis not present

## 2023-06-03 DIAGNOSIS — R140 Abdominal distension (gaseous): Secondary | ICD-10-CM | POA: Diagnosis not present

## 2023-06-03 MED ORDER — IOHEXOL 350 MG/ML SOLN
75.0000 mL | Freq: Once | INTRAVENOUS | Status: AC | PRN
  Administered 2023-06-03: 75 mL via INTRAVENOUS

## 2023-06-05 ENCOUNTER — Other Ambulatory Visit: Payer: Self-pay

## 2023-06-05 MED ORDER — NITROGLYCERIN 0.4 MG SL SUBL: 0.4000 mg | SUBLINGUAL_TABLET | SUBLINGUAL | 2 refills | Status: AC | PRN

## 2023-06-17 DIAGNOSIS — L570 Actinic keratosis: Secondary | ICD-10-CM | POA: Diagnosis not present

## 2023-06-24 ENCOUNTER — Telehealth (HOSPITAL_COMMUNITY): Payer: Self-pay | Admitting: Pharmacy Technician

## 2023-06-24 NOTE — Telephone Encounter (Signed)
Advanced Heart Failure Patient Advocate Encounter    Patient was automatically approved to receive Farxiga from [employer] through 07/22/24   Document scanned to chart. Patient will be notified via mail. Patient currently using the assistance through previous enrollment end date. Sent 90 day RX request to Chantel (CMA) to send to [facility]. No further action needed at this time.   Archer Asa, CPhT

## 2023-06-25 MED ORDER — DAPAGLIFLOZIN PROPANEDIOL 10 MG PO TABS: 10.0000 mg | ORAL_TABLET | Freq: Every morning | ORAL | 3 refills | Status: AC

## 2023-06-25 NOTE — Addendum Note (Signed)
Addended by: Theresia Bough on: 06/25/2023 11:41 AM   Modules accepted: Orders

## 2023-06-25 NOTE — Telephone Encounter (Signed)
 SCRIPT SENT

## 2023-07-01 ENCOUNTER — Telehealth (HOSPITAL_COMMUNITY): Payer: Self-pay

## 2023-07-01 DIAGNOSIS — N184 Chronic kidney disease, stage 4 (severe): Secondary | ICD-10-CM | POA: Diagnosis not present

## 2023-07-01 DIAGNOSIS — E871 Hypo-osmolality and hyponatremia: Secondary | ICD-10-CM | POA: Diagnosis not present

## 2023-07-01 DIAGNOSIS — I5032 Chronic diastolic (congestive) heart failure: Secondary | ICD-10-CM | POA: Diagnosis not present

## 2023-07-01 DIAGNOSIS — I350 Nonrheumatic aortic (valve) stenosis: Secondary | ICD-10-CM | POA: Diagnosis not present

## 2023-07-01 DIAGNOSIS — I129 Hypertensive chronic kidney disease with stage 1 through stage 4 chronic kidney disease, or unspecified chronic kidney disease: Secondary | ICD-10-CM | POA: Diagnosis not present

## 2023-07-01 DIAGNOSIS — N2581 Secondary hyperparathyroidism of renal origin: Secondary | ICD-10-CM | POA: Diagnosis not present

## 2023-07-01 DIAGNOSIS — N189 Chronic kidney disease, unspecified: Secondary | ICD-10-CM | POA: Diagnosis not present

## 2023-07-01 DIAGNOSIS — D631 Anemia in chronic kidney disease: Secondary | ICD-10-CM | POA: Diagnosis not present

## 2023-07-01 NOTE — Progress Notes (Incomplete)
Advanced Heart Failure Clinic Note   Referring Physician: PCP: [name], [name], MD PCP-Cardiologist: [name], MD  HF Cardiology: Dr. Shirlee Latch  HPI:  Mr Zuker is 77 year old with a history of CAD with STEMI 2021 and DES to LCx, permanent atrial fibrillation, severe MR, HTN, HLD, sleep apnea, and HFpEF.    Admitted 2/24 with A/C HFpEF and RV failure. Echo showed EF 50-55%, D-shaped septum, moderate-severe RV enlargement with moderate RV dysfunction, PASP 69, severe biatrial enlargement, severe mitral regurgitation with restricted posterior mitral leaflet, EROA 0.51 cm2 by PISA, moderate AS with mean gradient 21 mmHg/AVA 1.2 cm^2, IVC dilated. Diuresed with IV lasix with poor response, hypotension and worsening renal function. Dobutamine added for RV support. Diuresis improved. Overall diuresed > 90 pounds. Transitioned to torsemide 80 mg twice daily.  Hospitalization complicated by development of acute abdominal pain. Sent for stat CT abdomen/pelvis and this showed spontaneous rectus sheath hematoma. Anticoagulants held w/ gradual improvement. Anticoagulants ultimately restarted.  Once stabilized, he underwent TEE to further assess his MR. This showed LV EF 55%, D-shaped septum, moderate RV dysfunction, severe functional MR (suspect atrial functional, also significant posterior leaflet restriction), mild TR, moderate aortic stenosis. Structural heart team consulted. On 10/10/22, he underwent successful mTEER with residual 1+ MR. He was discharged home on 10/11/22 after a prolonged hospitalization. D/c wt 225 lb.  He presents today for followup of CHF.  He has noted increased leg edema recently and weight is up 17 lbs since last appointment.  Generally no dyspnea walking on flat ground, but he gets short of breath walking up hills/stairs. Able to get out to mow his grass.  No orthopnea/PND.  No lightheadedness.    ECG (personally reviewed): atrial fibrillation rate 85, PVC, iRBBB  Labs (4/24):  hgb 9.9, K 4.3, creatinine 1.55  Review of Systems: All systems reviewed and negative except as per HPI.   PMH: 1. Atrial fibrillation: Permanent.  2. H/o COVID-19 infection 3. CAD: STEMI in 2021 with DES to 99% mLCx.   4. Type 2 diabetes 5. HTN 6. Hyperlipidemia 7. OSA:  8. ABIs normal in 2/24.  9. Paralyzed vocal cords with hoarseness 10. Mitral regurgitation: ?Atrial functional  - Echo (2/24): EF 50-55%, D-shaped septum, moderate-severe RV enlargement with moderate RV dysfunction, PASP 69, severe biatrial enlargement, severe mitral regurgitation with restricted posterior mitral leaflet, EROA 0.51 cm2 by PISA, moderate AS with mean gradient 21 mmHg/AVA 1.2 cm^2, IVC dilated. - TEE (3/24): LV EF 55%, D-shaped septum, moderate RV dysfunction, severe functional MR (suspect atrial functional, also significant posterior leaflet restriction), mild TR, moderate aortic stenosis.  - mTEER (3/24) with 2 Pascal devices.  11. Chronic diastolic CHF with prominent RV dysfunction: Echo (4/24) with EF 50-55%, mild-moderate MR s/p mTEER with mean gradient 6 mmHg, severe AS with mean gradient 21 mmHg, AVA 0.8 cm^2, DI 0.25.  12. Aortic stenosis: Possible low flow/low gradient severe AS by 4/24 echo.  13. Rectus sheath hematoma: Spontaneous, 3/24.  14. CKD stage 3   Current Outpatient Medications  Medication Sig Dispense Refill   amoxicillin (AMOXIL) 500 MG capsule Take 4 capsules (2,000 mg total) by mouth as directed. Take 4 tablets 1 hour prior to dental work, including cleanings. 12 capsule 12   apixaban (ELIQUIS) 5 MG TABS tablet Take 1 tablet (5 mg total) by mouth 2 (two) times daily. 180 tablet 1   ascorbic acid (VITAMIN C) 1000 MG tablet Take 1 tablet (1,000 mg total) by mouth daily. (Patient taking differently:  Take 1,000 mg by mouth daily. Rose hips) 60 tablet 1   atorvastatin (LIPITOR) 80 MG tablet Take 1 tablet by mouth once daily 90 tablet 3   dapagliflozin propanediol (FARXIGA) 10 MG TABS  tablet Take 1 tablet (10 mg total) by mouth in the morning. 90 tablet 3   midodrine (PROAMATINE) 2.5 MG tablet TAKE 1 TABLET BY MOUTH THREE TIMES DAILY WITH MEALS 90 tablet 0   Multiple Vitamin (MULTIVITAMIN WITH MINERALS) TABS tablet Take 1 tablet by mouth daily.     neomycin-bacitracin-polymyxin [phone] OINT Apply 1 Application topically 3 (three) times daily as needed (wounds/bruises).     nitroGLYCERIN (NITROSTAT) 0.4 MG SL tablet Place 1 tablet (0.4 mg total) under the tongue every 5 (five) minutes x 3 doses as needed for chest pain. 25 tablet 2   potassium chloride SA (KLOR-CON M20) 20 MEQ tablet Take 1 tablet (20 mEq total) by mouth daily. (Patient taking differently: Take 20 mEq by mouth every other day.) 120 tablet 5   spironolactone (ALDACTONE) 25 MG tablet Take 25 mg by mouth daily.     TOLAK 4 % CREA Apply 1 Application topically at bedtime.     torsemide (DEMADEX) 20 MG tablet Take 3 tablets (60 mg total) by mouth 2 (two) times daily. 240 tablet 6   No current facility-administered medications for this visit.    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, environmental  Tobacco Use   Smoking status: Former    Current packs/day: 0.00    Types: Cigarettes    Quit date: 04/02/1959    Years since quitting: 64.2   Smokeless tobacco: Never  Vaping Use   Vaping status: Never Used  Substance and Sexual Activity   Alcohol use: No   Drug use: No   Sexual activity: Yes  Other Topics Concern    Not on file  Social History Narrative   Not on file   Social Determinants of Health   Financial Resource Strain: Low Risk  (10/19/2022)   Overall Financial Resource Strain (CARDIA)    Difficulty of Paying Living Expenses: Not hard at all  Food Insecurity: No Food Insecurity (10/19/2022)   Hunger Vital Sign    Worried About Running Out of Food in the Last Year: Never true    Ran Out of Food in the Last Year: Never true  Transportation Needs: No Transportation Needs (10/19/2022)   PRAPARE - Administrator, Civil Service (Medical): No    Lack of Transportation (Non-Medical): No  Physical Activity: Inactive (10/14/2022)   Exercise Vital Sign    Days of Exercise per Week: 0 days    Minutes of Exercise per Session: 30 min  Stress: No Stress Concern Present (10/19/2022)   [name]    Feeling of Stress : Not at all  Social Connections: Moderately Integrated (10/14/2022)   Social Connection and Isolation Panel [  NHANES]    Frequency of Communication with Friends and Family: More than three times a week    Frequency of Social Gatherings with Friends and Family: Once a week    Attends Religious Services: More than 4 times per year    Active Member of [employer] or Organizations: Yes    Attends Banker Meetings: More than 4 times per year    Marital Status: Widowed  Intimate Partner Violence: Not At Risk (10/07/2022)   Humiliation, Afraid, Rape, and Kick questionnaire    Fear of Current or Ex-Partner: No    Emotionally Abused: No    Physically Abused: No    Sexually Abused: No      Family History  Problem Relation Age of Onset   Cancer Mother        lungs   Coronary artery disease Mother    Diabetes Brother    Heart attack Brother    Diabetes Maternal Grandmother    Arthritis Father     There were no vitals filed for this visit.  PHYSICAL EXAM: General: NAD Neck: JVP 10-11 cm with HJR, no  thyromegaly or thyroid nodule.  Lungs: Clear to auscultation bilaterally with normal respiratory effort. CV: Nondisplaced PMI.  Heart irregular S1/S2, no S3/S4, 2/6 SEM RUSB.  2+ edema 1/2 to knees bilaterally.  No carotid bruit.  Difficult to palpate pedal pulses.  Abdomen: Soft, nontender, no hepatosplenomegaly, no distention.  Skin: Intact without lesions or rashes.  Neurologic: Alert and oriented x 3.  Psych: Normal affect. Extremities: No clubbing or cyanosis.  HEENT: Normal.    ASSESSMENT & PLAN:  1. Chronic diastolic CHF: With prominent RV dysfunction. Recent hospitalization 2/24 with marked volume overload with low output, requiring RV inotropic support with dobutamine, he had >90 lbs off via diuresis.  Severe mitral regurgitation contributed to CHF, now s/p mTEER.  Most recent echo in 4/24 showed EF 50-55%, mild-moderate MR s/p mTEER with mean gradient 6 mmHg, severe AS with mean gradient 21 mmHg, AVA 0.8 cm^2, DI 0.25. He is starting to regain fluid, weight up 17 lbs and volume overloaded on exam.  Severe aortic stenosis now may be playing a role in CHF.  NYHA class III.  - Continue po torsemide 80 bid, take metolazone 2.5 mg once weekly on Saturdays with extra 40 mEq KCl.  BMET/BNP today, BMET 10 days.  - Increase spironolactone to 25 mg daily.  - Add Farxiga 10 mg daily.  - On midodrine 2.5 tid, hopefully will be able to stop soon.  - recommended TED hose  2. Mitral regurgitation: Severe functional MR by echo 2/24.  TEE in 3/24 showed LV EF 55%, D-shaped septum, moderate RV dysfunction, severe functional MR (suspect atrial functional, also significant posterior leaflet restriction), mild TR, moderate aortic stenosis.  He is now s/p mTEER with 2 Pascal devices.  Echo in 4/24 showed mean MV gradient 6 mmHg with mild-moderate residual MR.  - Will need antibiotic prophylaxis with dental work.  3. CKD stage 3: BMET today.  4. Atrial fibrillation: Permanent, has been seen by EP in the  past. Not likely to successfully cardiovert and stay in NSR.   - Continue Eliquis. Check CBC today.  5. CAD:  STEMI with DES to mLCx in 10/21.  No chest pain.  - Continue atorvastatin, check lipids today.  - No ASA with Eliquis use.  6. LE Wounds: Left LE wrapped, followed by wound care and Dr. Lajoyce Corners. ABIs were normal in 2/24.  8. Aortic  stenosis: Echo in 4/24 appeared to show severe low flow/low gradient aortic stenosis. Increased forward flow with improvement in MR may have made aortic stenosis more obvious.  Will need to consider TAVR.  - I will arrange for TEE for better view of aortic valve (is AS truly severe and in need of TAVR?) => Discussed risks/benefits with patient and he agrees to procedure. - I will arrange for RHC/LHC as part of pre-TAVR evaluation, also to assess filling pressures with increased diuresis as above. He will need to hold Eliquis day before and day of cath. Discussed risks/benefits with patient and he agrees to procedure.  Followup in 1 month.   Anderson Malta Artois, FNP 07/01/23

## 2023-07-01 NOTE — Telephone Encounter (Signed)
Called and left patient a voice message to confirm/remind patient of their appointment at the Advanced [facility] Failure Clinic on 07/02/23.

## 2023-07-02 ENCOUNTER — Ambulatory Visit (HOSPITAL_COMMUNITY)
Admission: RE | Admit: 2023-07-02 | Discharge: 2023-07-02 | Disposition: A | Discharge status: Home / Self Care | Payer: Medicare Other | Source: Ambulatory Visit | Attending: Family Medicine | Admitting: Family Medicine

## 2023-07-02 ENCOUNTER — Encounter (HOSPITAL_COMMUNITY): Payer: Self-pay

## 2023-07-02 ENCOUNTER — Telehealth: Payer: Self-pay | Admitting: Internal Medicine

## 2023-07-02 VITALS — BP 130/80 | HR 87 | Wt 256.4 lb

## 2023-07-02 DIAGNOSIS — I5022 Chronic systolic (congestive) heart failure: Secondary | ICD-10-CM | POA: Insufficient documentation

## 2023-07-02 DIAGNOSIS — Z79899 Other long term (current) drug therapy: Secondary | ICD-10-CM | POA: Diagnosis not present

## 2023-07-02 DIAGNOSIS — Z951 Presence of aortocoronary bypass graft: Secondary | ICD-10-CM | POA: Insufficient documentation

## 2023-07-02 DIAGNOSIS — I4821 Permanent atrial fibrillation: Secondary | ICD-10-CM | POA: Insufficient documentation

## 2023-07-02 DIAGNOSIS — Z7984 Long term (current) use of oral hypoglycemic drugs: Secondary | ICD-10-CM | POA: Insufficient documentation

## 2023-07-02 DIAGNOSIS — I252 Old myocardial infarction: Secondary | ICD-10-CM | POA: Insufficient documentation

## 2023-07-02 DIAGNOSIS — N183 Chronic kidney disease, stage 3 unspecified: Secondary | ICD-10-CM | POA: Insufficient documentation

## 2023-07-02 DIAGNOSIS — E1122 Type 2 diabetes mellitus with diabetic chronic kidney disease: Secondary | ICD-10-CM | POA: Insufficient documentation

## 2023-07-02 DIAGNOSIS — I2510 Atherosclerotic heart disease of native coronary artery without angina pectoris: Secondary | ICD-10-CM | POA: Diagnosis not present

## 2023-07-02 DIAGNOSIS — Z7901 Long term (current) use of anticoagulants: Secondary | ICD-10-CM | POA: Diagnosis not present

## 2023-07-02 DIAGNOSIS — Z955 Presence of coronary angioplasty implant and graft: Secondary | ICD-10-CM | POA: Diagnosis not present

## 2023-07-02 DIAGNOSIS — I5030 Unspecified diastolic (congestive) heart failure: Secondary | ICD-10-CM | POA: Diagnosis not present

## 2023-07-02 DIAGNOSIS — I130 Hypertensive heart and chronic kidney disease with heart failure and stage 1 through stage 4 chronic kidney disease, or unspecified chronic kidney disease: Secondary | ICD-10-CM | POA: Insufficient documentation

## 2023-07-02 DIAGNOSIS — I083 Combined rheumatic disorders of mitral, aortic and tricuspid valves: Secondary | ICD-10-CM | POA: Diagnosis not present

## 2023-07-02 DIAGNOSIS — Z952 Presence of prosthetic heart valve: Secondary | ICD-10-CM | POA: Diagnosis not present

## 2023-07-02 NOTE — Telephone Encounter (Signed)
Forms received and will have Dr Tenny Craw sign today.
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
## 2023-07-02 NOTE — Telephone Encounter (Signed)
Forms signed and faxed to our office to be indexed to [facility] for further management.

## 2023-07-02 NOTE — Patient Instructions (Signed)
STOP Midodrine.  Your physician recommends that you schedule a follow-up appointment in: 2 months   If you have any questions or concerns before your next appointment please send [place] a message through [place] or call our office at [phone].    TO LEAVE A MESSAGE FOR THE NURSE SELECT OPTION 2, PLEASE LEAVE A MESSAGE INCLUDING: YOUR NAME DATE OF BIRTH CALL BACK NUMBER REASON FOR CALL**this is important as we prioritize the call backs  YOU WILL RECEIVE A CALL BACK THE SAME DAY AS LONG AS YOU CALL BEFORE 4:00 PM  At the [facility], you and your health needs are our priority. As part of our continuing mission to provide you with exceptional heart care, we have created designated Provider Care Teams. These Care Teams include your primary Cardiologist (physician) and Advanced Practice Providers (APPs- Physician Assistants and Nurse Practitioners) who all work together to provide you with the care you need, when you need it.   You may see any of the following providers on your designated Care Team at your next follow up: Dr Arvilla Meres Dr Marca Ancona Dr. Dorthula Nettles Dr. Clearnce Hasten Amy Filbert Schilder, NP Robbie Lis, [place] [facility] [place], [place] Brynda Peon, NP [place] Lee, NP Karle Plumber, PharmD   Please be sure to bring in all your medications bottles to every appointment.    Thank you for choosing [facility]

## 2023-07-02 NOTE — Progress Notes (Signed)
ReDS Vest / Clip - 07/02/23 1400       ReDS Vest / Clip   Station Marker D    Ruler Value 35    ReDS Value Range Low volume    ReDS Actual Value 30

## 2023-07-02 NOTE — Telephone Encounter (Signed)
Patient brought in Patient assistance application.  Will be in provider box

## 2023-07-04 ENCOUNTER — Other Ambulatory Visit (HOSPITAL_COMMUNITY): Payer: Self-pay | Admitting: Adult Health

## 2023-07-09 ENCOUNTER — Telehealth: Payer: Self-pay

## 2023-07-09 NOTE — Telephone Encounter (Signed)
Please have provider sign form located in chart media (BMS PROVIDER FORM) and fax back to [phone]. Please reach out once fax has been sent.   Came across application on med assist fax. Notification was not received when application was moved to med assist fax. Application has wrong provider on the provider form and will need to be redone. Missing Dr. Charlott Rakes signature.

## 2023-07-11 ENCOUNTER — Other Ambulatory Visit (HOSPITAL_COMMUNITY): Payer: Self-pay | Admitting: Cardiology

## 2023-07-16 NOTE — Telephone Encounter (Signed)
Provider page has been signed and faxed to [phone].

## 2023-07-16 NOTE — Telephone Encounter (Signed)
PAP: Application for ELIQUIS has been submitted to PAP Companies: [employer], via fax. If patient requests a status update, please refer them to BMS at [phone]

## 2023-07-16 NOTE — Telephone Encounter (Signed)
Received, thank you
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
## 2023-08-07 ENCOUNTER — Ambulatory Visit: Payer: Medicare Other | Admitting: Nurse Practitioner

## 2023-08-07 ENCOUNTER — Encounter: Payer: Self-pay | Admitting: Nurse Practitioner

## 2023-08-07 DIAGNOSIS — M19242 Secondary osteoarthritis, left hand: Secondary | ICD-10-CM

## 2023-08-07 DIAGNOSIS — M19241 Secondary osteoarthritis, right hand: Secondary | ICD-10-CM

## 2023-08-07 DIAGNOSIS — J321 Chronic frontal sinusitis: Secondary | ICD-10-CM

## 2023-08-07 DIAGNOSIS — M79642 Pain in left hand: Secondary | ICD-10-CM

## 2023-08-07 DIAGNOSIS — M19049 Primary osteoarthritis, unspecified hand: Secondary | ICD-10-CM | POA: Insufficient documentation

## 2023-08-07 DIAGNOSIS — M79641 Pain in right hand: Secondary | ICD-10-CM

## 2023-08-07 MED ORDER — CEFDINIR 300 MG PO CAPS
300.0000 mg | ORAL_CAPSULE | Freq: Two times a day (BID) | ORAL | 0 refills | Status: DC
Start: 2023-08-07 — End: 2023-08-22

## 2023-08-07 MED ORDER — DICLOFENAC SODIUM 1 % EX GEL
2.0000 g | Freq: Four times a day (QID) | CUTANEOUS | 2 refills | Status: DC
Start: 2023-08-07 — End: 2023-10-31

## 2023-08-07 MED ORDER — FLUTICASONE PROPIONATE 50 MCG/ACT NA SUSP: 2.0000 | Freq: Every day | NASAL | 6 refills | Status: DC

## 2023-08-09 NOTE — Telephone Encounter (Signed)
Received notification from BMS of missing information: Prescription / Directions / Provider information (DEA, SLN, NPI) / Signature invalid.  Unable to determine what was missing on application that was sent; recommend resending MD/RX form to BMS, if patient will meet criteria for 2025 approval.   If patient has not yet met the 3% expense requirement, we may need to wait until eligible to reapply.  Burnell Blanks, CPhT Rx Patient Advocate Phone: [phone]

## 2023-08-12 NOTE — Telephone Encounter (Signed)
Thanks Judeth Cornfield! I refaxed it to them on 08/01/22. He hasn't provided expenses for 2025 so just waiting on an official denial.

## 2023-08-16 ENCOUNTER — Telehealth: Payer: Self-pay | Admitting: Internal Medicine

## 2023-08-16 ENCOUNTER — Telehealth: Payer: Self-pay | Admitting: Pharmacy Technician

## 2023-08-16 ENCOUNTER — Encounter: Payer: Self-pay | Admitting: Pharmacy Technician

## 2023-08-16 NOTE — Telephone Encounter (Signed)
PAP: Patient assistance application for Eliquis has been approved by PAP Companies: [employer] from 08/16/23 to 07/22/24. Medication should be delivered to PAP Delivery: Home. For further shipping updates, please contact [employer] (BMS) at [phone]. Patient ID is: [id number]    I sent the patient a mychart message to let them know. Thank you

## 2023-08-16 NOTE — Telephone Encounter (Signed)
Paper Work Dropped Off: Patient assistance   Date:08/16/2023  Location of paper: Dr Tenny Craw mail box  Patient needs all forms filled out

## 2023-08-16 NOTE — Telephone Encounter (Signed)
Patient assistance drop off (llook at previous note)

## 2023-08-16 NOTE — Telephone Encounter (Signed)
Per [employer] rep, should have determination by end of day.

## 2023-08-16 NOTE — Telephone Encounter (Signed)
PAP: Patient assistance application for Eliquis has been approved by PAP Companies: [employer] from 08/16/23 to 07/22/24. Medication should be delivered to PAP Delivery: Home. For further shipping updates, please contact [employer] ([employer]) at [phone]. Patient ID is: [id number]   I sent the pt a mychart message as well

## 2023-08-17 ENCOUNTER — Other Ambulatory Visit (HOSPITAL_COMMUNITY): Payer: Self-pay | Admitting: Cardiology

## 2023-08-19 NOTE — Telephone Encounter (Signed)
Forms received and given to Dr Tenny Craw to sign.

## 2023-08-20 NOTE — Telephone Encounter (Signed)
Forms signed and scanned in for pt assistance.

## 2023-08-21 ENCOUNTER — Telehealth: Payer: Self-pay | Admitting: Internal Medicine

## 2023-08-21 ENCOUNTER — Ambulatory Visit: Payer: Self-pay | Admitting: Family Medicine

## 2023-08-21 MED ORDER — TORSEMIDE 20 MG PO TABS: 60.0000 mg | ORAL_TABLET | Freq: Two times a day (BID) | ORAL | 3 refills | Status: AC

## 2023-08-21 NOTE — Telephone Encounter (Signed)
*  STAT* If patient is at the pharmacy, call can be transferred to refill team.   1. Which medications need to be refilled? (please list name of each medication and dose if known)  orsemide (DEMADEX) 20 MG tablet  2. Which pharmacy/location (including street and city if local pharmacy) is medication to be sent to?  [facility] [address], [place] - [address]  3. Do they need a 30 day or 90 day supply? 90

## 2023-08-21 NOTE — Telephone Encounter (Signed)
This is a CHF pt

## 2023-08-21 NOTE — Telephone Encounter (Signed)
Please see 07/09/23 encounter. Patient has since been approved.

## 2023-08-21 NOTE — Telephone Encounter (Signed)
Copied from CRM [id number]. Topic: Clinical - Medical Advice >> Aug 21, 2023  9:17 AM Ryan Garner wrote: Reason for CRM: Patient called states he has been seen for sinus infection twice and it is not improving. Has worsening symptoms. Thank You   Chief Complaint: worsening sinus infection Symptoms: "little bit more" SOB than usual, nasal congestion, puffy swelling to forehead/cheeks/around eyes, "6 or 7" out of 10 sinus pain above eyes and to cheeks, nasal discharge "some of it's bloody, some of it's green," coughing up "glob of green," sore throat "some" Frequency: continual, worsening Pertinent Negatives: Patient denies chest pain, weakness, dizziness, struggling to breathe, fever, chills, nose blocked Disposition: [] 911 / [] ED /[] Urgent Care (no appt availability in office) / [] Appointment(In office/virtual)/ []  [facility]/ [] Home Care/ [x] Refused Recommended Disposition /[] [place] [facility]/ []  Follow-up with PCP Additional Notes: Pt reporting that he's been experiencing sinus infection symptoms worsening after round of antibx, reporting usually takes "2 rounds of antibx" to rid him of sinus infection, "gave me one round, never knocked it out." Pt reporting congestion with discolored discharge, "6 or 7" out of 10 pain to sinuses not improved with OTC meds, coughing up green phlegm, "a little bit" more difficulty breathing than usual that is constant and "sometimes a little worse," but confirms no chest pain, weakness, dizziness, chills, or fever. Pt confirms just catching his breath a little bit more. Advised pt be examined in next 4 hours for symptoms, no availability with PCP office today, offered to schedule with other office or UC, pt preferring PCP office tomorrow, warm transferred to CAL for earlier appt, CAL front desk transferred to in-office triage nurse in case of more immediate follow-up with doc. In-office triage nurse Asher Muir scheduled pt for tomorrow am, pt verbalized  understanding.  Reason for Disposition  [1] Redness or swelling on the cheek, forehead or around the eye AND [2] no fever  Answer Assessment - Initial Assessment Questions 1. LOCATION: "Where does it hurt?"      Forehead above eyes and to cheeks 2. ONSET: "When did the sinus pain start?"  (e.g., hours, days)      Been with me for at least a week, last antibx didn't help much at all 3. SEVERITY: "How bad is the pain?"   (Scale 1-10; mild, moderate or severe)   - MILD (1-3): doesn't interfere with normal activities    - MODERATE (4-7): interferes with normal activities (e.g., work or school) or awakens from sleep   - SEVERE (8-10): excruciating pain and patient unable to do any normal activities        6 or 7 4. RECURRENT SYMPTOM: "Have you ever had sinus problems before?" If Yes, ask: "When was the last time?" and "What happened that time?"      Sinus infection, 3 months 5. NASAL CONGESTION: "Is the nose blocked?" If Yes, ask: "Can you open it or must you breathe through your mouth?"     Nose not blocked, able to breathe well through mouth and nose 6. NASAL DISCHARGE: "Do you have discharge from your nose?" If so ask, "What color?"     Yes, all different colors, some of it's bloody, some of it's green 7. FEVER: "Do you have a fever?" If Yes, ask: "What is it, how was it measured, and when did it start?"      denies 8. OTHER SYMPTOMS: "Do you have any other symptoms?" (e.g., sore throat, cough, earache, difficulty breathing)     Coughing,  coughing up glob of green, sore throat some  Protocols used: Sinus Pain or Congestion-A-AH

## 2023-08-22 ENCOUNTER — Ambulatory Visit: Payer: Medicare Other

## 2023-08-22 ENCOUNTER — Encounter: Payer: Self-pay | Admitting: Family Medicine

## 2023-08-22 VITALS — BP 116/60 | HR 78 | Temp 97.4°F | Ht 68.0 in | Wt 264.4 lb

## 2023-08-22 DIAGNOSIS — J324 Chronic pansinusitis: Secondary | ICD-10-CM

## 2023-08-22 MED ORDER — FLUTICASONE PROPIONATE 50 MCG/ACT NA SUSP
2.0000 | Freq: Every day | NASAL | 6 refills | Status: DC
Start: 2023-08-22 — End: 2023-09-11

## 2023-08-22 MED ORDER — PREDNISONE 20 MG PO TABS
40.0000 mg | ORAL_TABLET | Freq: Every day | ORAL | 0 refills | Status: AC
Start: 2023-08-22 — End: 2023-08-27

## 2023-08-22 MED ORDER — DOXYCYCLINE HYCLATE 100 MG PO TABS
100.0000 mg | ORAL_TABLET | Freq: Two times a day (BID) | ORAL | 0 refills | Status: AC
Start: 2023-08-22 — End: 2023-09-01

## 2023-08-22 NOTE — Progress Notes (Signed)
Subjective:  Patient ID: Ryan Garner, male    DOB: 1945/09/11, 78 y.o.   MRN: [id number]  Patient Care Team: [name], [name], MD as PCP - General (Family Medicine) Pricilla Riffle, MD as PCP - Cardiology (Cardiology) Clinton Gallant, RN as [facility] Care Management   Chief Complaint:  Cough (Was seen 1/15 cough SOB feeling worse. Has also been out of fluid pill for 3 days gaining fluid) and Shortness of Breath   HPI: Ryan Garner is a 78 y.o. male presenting on 08/22/2023 for Cough (Was seen 1/15 cough SOB feeling worse. Has also been out of fluid pill for 3 days gaining fluid) and Shortness of Breath   Discussed the use of AI scribe software for clinical note transcription with the patient, who gave verbal consent to proceed.  History of Present Illness   The patient presents with chronic sinus problems.  For the past three months, they have experienced sinus problems characterized by rhinorrhea, cough, facial puffiness, and drainage. They have been treated with cefdinir previously, but one round was insufficient, typically requiring two rounds for resolution. They have been unable to obtain a refill, necessitating a return visit.  They describe pressure in their head, particularly underneath the eyes and on the forehead. They have been using Flonase inconsistently. They are also out of their fluid pill, which they plan to pick up from the pharmacy today.  No fevers have been reported. They mention a sore throat, a lot of drainage, and some wheezing. They have not been seeing an ear, nose, and throat specialist for their chronic sinusitis.          Relevant past medical, surgical, family, and social history reviewed and updated as indicated.  Allergies and medications reviewed and updated. Data reviewed: Chart in Epic.   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 Excell Seltzer and Dr. 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 Clifton James & Dr. Laneta Simmers   Sleep apnea    STEMI (ST elevation myocardial infarction) [facility])     Past Surgical History:  Procedure Laterality Date   APPENDECTOMY     BUBBLE STUDY  10/02/2022   Procedure: BUBBLE STUDY;  Surgeon: Laurey Morale, MD;  Location: [facility] ENDOSCOPY;  Service: Cardiovascular;;   CHOLECYSTECTOMY N/A 03/02/2020   Procedure: LAPAROSCOPIC CHOLECYSTECTOMY;  Surgeon: Franky Macho, MD;  Location: [facility] ORS;  Service: General;  Laterality: N/A;   CORONARY STENT INTERVENTION N/A 04/30/2020   Procedure: CORONARY STENT INTERVENTION;  Surgeon: Swaziland, Peter M, MD;  Location: [facility] INVASIVE CV LAB;  Service: Cardiovascular;  Laterality: N/A;   CORONARY/GRAFT ACUTE MI REVASCULARIZATION N/A 04/30/2020   Procedure: Coronary/Graft Acute MI Revascularization;  Surgeon: Swaziland, Peter M, MD;  Location: [facility] INVASIVE CV LAB;  Service: Cardiovascular;  Laterality: N/A;   Eyelid Surgery Bilateral    HERNIA REPAIR     I & D EXTREMITY Left 04/06/2022  Procedure: LEFT LEG DEBRIDEMENT;  Surgeon: Nadara Mustard, MD;  Location: [facility] OR;  Service: Orthopedics;  Laterality: Left;   I & D EXTREMITY Left 04/04/2022   Procedure: LEFT LEG DEBRIDEMENT;  Surgeon: Nadara Mustard, MD;  Location: [facility] OR;  Service: Orthopedics;  Laterality: Left;   INTRAOPERATIVE TRANSTHORACIC ECHOCARDIOGRAM N/A 04/23/2023   Procedure: INTRAOPERATIVE TRANSTHORACIC ECHOCARDIOGRAM;  Surgeon: Kathleene Hazel, MD;  Location: [facility] INVASIVE CV LAB;  Service: Open Heart Surgery;  Laterality: N/A;    knee tendons repair     LEFT HEART CATH AND CORONARY ANGIOGRAPHY N/A 04/30/2020   Procedure: LEFT HEART CATH AND CORONARY ANGIOGRAPHY;  Surgeon: Swaziland, Peter M, MD;  Location: [facility] INVASIVE CV LAB;  Service: Cardiovascular;  Laterality: N/A;   RIGHT HEART CATH N/A 10/03/2022   Procedure: RIGHT HEART CATH;  Surgeon: Laurey Morale, MD;  Location: [facility] INVASIVE CV LAB;  Service: Cardiovascular;  Laterality: N/A;   RIGHT HEART CATH N/A 12/26/2022   Procedure: RIGHT HEART CATH;  Surgeon: Laurey Morale, MD;  Location: [facility] INVASIVE CV LAB;  Service: Cardiovascular;  Laterality: N/A;   RIGHT/LEFT HEART CATH AND CORONARY ANGIOGRAPHY N/A 02/05/2023   Procedure: RIGHT/LEFT HEART CATH AND CORONARY ANGIOGRAPHY;  Surgeon: Laurey Morale, MD;  Location: [facility] INVASIVE CV LAB;  Service: Cardiovascular;  Laterality: N/A;   ROTATOR CUFF REPAIR Bilateral    TEE WITHOUT CARDIOVERSION N/A 10/02/2022   Procedure: TRANSESOPHAGEAL ECHOCARDIOGRAM (TEE);  Surgeon: Laurey Morale, MD;  Location: [facility] ENDOSCOPY;  Service: Cardiovascular;  Laterality: N/A;   TEE WITHOUT CARDIOVERSION N/A 10/10/2022   Procedure: TRANSESOPHAGEAL ECHOCARDIOGRAM;  Surgeon: Tonny Bollman, MD;  Location: [facility] INVASIVE CV LAB;  Service: Cardiovascular;  Laterality: N/A;   TEE WITHOUT CARDIOVERSION N/A 12/26/2022   Procedure: TRANSESOPHAGEAL ECHOCARDIOGRAM;  Surgeon: Laurey Morale, MD;  Location: [facility] INVASIVE CV LAB;  Service: Cardiovascular;  Laterality: N/A;   TONSILLECTOMY     TRANSCATHETER AORTIC VALVE REPLACEMENT, TRANSFEMORAL N/A 04/23/2023   Procedure: Transcatheter Aortic Valve Replacement, Transfemoral;  Surgeon: Kathleene Hazel, MD;  Location: [facility] INVASIVE CV LAB;  Service: Open Heart Surgery;  Laterality: N/A;   TRANSCATHETER MITRAL EDGE TO EDGE REPAIR N/A 10/10/2022   Procedure: MITRAL VALVE REPAIR;  Surgeon: Tonny Bollman, MD;  Location: [facility] INVASIVE CV LAB;  Service: Cardiovascular;  Laterality: N/A;    Social History    Socioeconomic History   Marital status: Widowed    Spouse name: Karena Addison   Number of children: 2   Years of education: 20   Highest education level: Bachelor's degree (e.g., BA, AB, BS)  Occupational History   Occupation: Education officer, environmental    Comment: 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, environmental  Tobacco Use   Smoking status: Former    Current packs/day: 0.00    Types: Cigarettes    Quit date: 04/02/1959    Years since quitting: 64.4   Smokeless tobacco: Never  Vaping Use   Vaping status: Never Used  Substance and Sexual Activity   Alcohol use: No   Drug use: No   Sexual activity: Yes  Other Topics Concern   Not on file  Social History Narrative   Not on file   Social Drivers of Health   Financial Resource Strain: Low Risk  (10/19/2022)   Overall Financial Resource Strain (CARDIA)    Difficulty of Paying Living Expenses: Not hard at all  Food Insecurity: No Food Insecurity (10/19/2022)   Hunger Vital Sign  Worried About Programme researcher, broadcasting/film/video in the Last Year: Never true    Ran Out of Food in the Last Year: Never true  Transportation Needs: No Transportation Needs (10/19/2022)   PRAPARE - Administrator, Civil Service (Medical): No    Lack of Transportation (Non-Medical): No  Physical Activity: Unknown (10/14/2022)   Exercise Vital Sign    Days of Exercise per Week: 0 days    Minutes of Exercise per Session: Not on file  Recent Concern: Physical Activity - Inactive (10/14/2022)   Exercise Vital Sign    Days of Exercise per Week: 0 days    Minutes of Exercise per Session: 30 min  Stress: No Stress Concern Present (10/19/2022)   [name]    Feeling of Stress : Not at all  Social Connections: Moderately Integrated (10/14/2022)   Social Connection and Isolation Panel [NHANES]    Frequency of Communication with Friends and Family: More than three times a week    Frequency of  Social Gatherings with Friends and Family: Once a week    Attends Religious Services: More than 4 times per year    Active Member of [employer] or Organizations: Yes    Attends Banker Meetings: More than 4 times per year    Marital Status: Widowed  Intimate Partner Violence: Not At Risk (10/07/2022)   Humiliation, Afraid, Rape, and Kick questionnaire    Fear of Current or Ex-Partner: No    Emotionally Abused: No    Physically Abused: No    Sexually Abused: No    Outpatient Encounter Medications as of 08/22/2023  Medication Sig   apixaban (ELIQUIS) 5 MG TABS tablet Take 1 tablet (5 mg total) by mouth 2 (two) times daily.   ascorbic acid (VITAMIN C) 1000 MG tablet Take 1 tablet (1,000 mg total) by mouth daily.   atorvastatin (LIPITOR) 80 MG tablet Take 1 tablet by mouth once daily   dapagliflozin propanediol (FARXIGA) 10 MG TABS tablet Take 1 tablet (10 mg total) by mouth in the morning.   diclofenac Sodium (VOLTAREN) 1 % GEL Apply 2 g topically 4 (four) times daily.   doxycycline (VIBRA-TABS) 100 MG tablet Take 1 tablet (100 mg total) by mouth 2 (two) times daily for 10 days. 1 po bid   Multiple Vitamin (MULTIVITAMIN WITH MINERALS) TABS tablet Take 1 tablet by mouth daily.   neomycin-bacitracin-polymyxin [phone] OINT Apply 1 Application topically 3 (three) times daily as needed (wounds/bruises).   nitroGLYCERIN (NITROSTAT) 0.4 MG SL tablet Place 1 tablet (0.4 mg total) under the tongue every 5 (five) minutes x 3 doses as needed for chest pain.   potassium chloride SA (KLOR-CON M20) 20 MEQ tablet Take 1 tablet (20 mEq total) by mouth daily.   predniSONE (DELTASONE) 20 MG tablet Take 2 tablets (40 mg total) by mouth daily with breakfast for 5 days.   spironolactone (ALDACTONE) 25 MG tablet Take 25 mg by mouth daily.   torsemide (DEMADEX) 20 MG tablet Take 3 tablets (60 mg total) by mouth 2 (two) times daily.   [DISCONTINUED] cefdinir (OMNICEF) 300 MG capsule Take 1 capsule (300  mg total) by mouth 2 (two) times daily.   [DISCONTINUED] fluticasone (FLONASE) 50 MCG/ACT nasal spray Place 2 sprays into both nostrils daily.   fluticasone (FLONASE) 50 MCG/ACT nasal spray Place 2 sprays into both nostrils daily.   No facility-administered encounter medications on file as of 08/22/2023.    Allergies  Allergen Reactions   Flecainide Other (See Comments)    dizziness, shortness of breath, blurred vision   Metoprolol Tartrate Itching   Rofecoxib Rash and Other (See Comments)    (Vioxx)    Pertinent ROS per HPI, otherwise unremarkable      Objective:  BP 116/60   Pulse 78   Temp (!) 97.4 F (36.3 C) (Temporal)   Ht 5\' 8"  (1.727 m)   Wt 264 lb 6.4 oz (119.9 kg)   SpO2 100%   BMI 40.20 kg/m    Wt Readings from Last 3 Encounters:  08/22/23 264 lb 6.4 oz (119.9 kg)  08/07/23 258 lb (117 kg)  07/02/23 256 lb 6.4 oz (116.3 kg)    Physical Exam Vitals and nursing note reviewed.  Constitutional:      Appearance: Normal appearance. He is morbidly obese.  HENT:     Head: Normocephalic and atraumatic.     Right Ear: A middle ear effusion is present. Tympanic membrane is not erythematous.     Left Ear: A middle ear effusion is present. Tympanic membrane is not erythematous.     Nose: Congestion present.     Right Sinus: Maxillary sinus tenderness and frontal sinus tenderness present.     Left Sinus: Maxillary sinus tenderness and frontal sinus tenderness present.     Mouth/Throat:     Lips: Pink.     Mouth: Mucous membranes are moist.     Pharynx: Posterior oropharyngeal erythema and postnasal drip present. No oropharyngeal exudate.  Cardiovascular:     Rate and Rhythm: Normal rate and regular rhythm.     Heart sounds: Murmur heard.     Systolic murmur is present with a grade of 2/6.  Pulmonary:     Breath sounds: Wheezing (scattered, minimal) present. No rhonchi or rales.  Musculoskeletal:     Right lower leg: 1+ Edema present.     Left lower leg: 1+  Edema present.  Skin:    General: Skin is warm and dry.     Capillary Refill: Capillary refill takes less than 2 seconds.  Neurological:     General: No focal deficit present.     Mental Status: He is alert and oriented to person, place, and time.  Psychiatric:        Mood and Affect: Mood normal.        Behavior: Behavior normal.        Thought Content: Thought content normal.        Judgment: Judgment normal.     Results for orders placed or performed in visit on 05/20/23  ECHOCARDIOGRAM COMPLETE   Collection Time: 05/20/23  9:55 AM  Result Value Ref Range   Area-P 1/2 2.60 cm2   S' Lateral 3.70 cm   AV Area mean vel 2.56 cm2   AR max vel 2.52 cm2   AV Area VTI 2.47 cm2   MV VTI 2.49 cm2   Ao pk vel 2.01 m/s   AV Mean grad 10.0 mmHg   AV Peak grad 16.2 mmHg   MV M vel 5.45 m/s   MV Peak grad 118.8 mmHg   Est EF 45 - 50%        Pertinent labs & imaging results that were available during my care of the patient were reviewed by me and considered in my medical decision making.  Assessment & Plan:  Ryan Garner was seen today for cough and shortness of breath.  Diagnoses and all orders for this visit:  Chronic pansinusitis -  doxycycline (VIBRA-TABS) 100 MG tablet; Take 1 tablet (100 mg total) by mouth 2 (two) times daily for 10 days. 1 po bid -     fluticasone (FLONASE) 50 MCG/ACT nasal spray; Place 2 sprays into both nostrils daily. -     predniSONE (DELTASONE) 20 MG tablet; Take 2 tablets (40 mg total) by mouth daily with breakfast for 5 days.     Assessment and Plan    Chronic Sinusitis Chronic sinusitis with nasal drainage, cough, puffiness, and sinus pressure for three months. Previous cefdinir treatment was ineffective. No fever. Physical exam shows throat drainage and wheezing. Doxycycline chosen as an alternative antibiotic. Discussed Flonase daily for two weeks, then as needed, and a short burst of steroids for wheezing and sinus congestion. Emphasized follow-up  with ENT specialist, Dr. Louanne Skye. - Prescribe doxycycline 100 mg BID for 10 days - Prescribe Flonase daily for two weeks, then as needed - Prescribe short burst of steroids for wheezing and sinus congestion - Follow up with ENT specialist, Dr. Louanne Skye - Send prescriptions to [facility] pharmacy  Medication Refill for Diuretics Reports running out of torasemide or spironolactone. Advised to pick up medication from the pharmacy today and to notify if unable to obtain medication so it can be sent to another pharmacy. - Pick up medication from the pharmacy today - Notify if unable to obtain medication so it can be sent to another pharmacy.          Continue all other maintenance medications.  Follow up plan: Return if symptoms worsen or fail to improve.   Continue healthy lifestyle choices, including diet (rich in fruits, vegetables, and lean proteins, and low in salt and simple carbohydrates) and exercise (at least 30 minutes of moderate physical activity daily).  Educational handout given for sinus infection   The above assessment and management plan was discussed with the patient. The patient verbalized understanding of and has agreed to the management plan. Patient is aware to call the clinic if they develop any new symptoms or if symptoms persist or worsen. Patient is aware when to return to the clinic for a follow-up visit. Patient educated on when it is appropriate to go to the emergency department.   Kari Baars, FNP-C [facility] [phone]

## 2023-08-31 ENCOUNTER — Other Ambulatory Visit: Payer: Self-pay | Admitting: Family Medicine

## 2023-08-31 DIAGNOSIS — J324 Chronic pansinusitis: Secondary | ICD-10-CM

## 2023-09-03 ENCOUNTER — Telehealth (HOSPITAL_COMMUNITY): Payer: Self-pay

## 2023-09-03 NOTE — Telephone Encounter (Signed)
Attempted to call patient to confirm appointment tomorrow with [facility] clinic. Unable to reach or leave message at this time.

## 2023-09-04 ENCOUNTER — Encounter (HOSPITAL_COMMUNITY): Payer: Self-pay

## 2023-09-04 ENCOUNTER — Ambulatory Visit (HOSPITAL_COMMUNITY)
Admission: RE | Admit: 2023-09-04 | Discharge: 2023-09-04 | Disposition: A | Discharge status: Home / Self Care | Payer: Medicare Other | Source: Ambulatory Visit | Attending: Family Medicine | Admitting: Family Medicine

## 2023-09-04 VITALS — BP 114/78 | HR 80 | Ht 67.0 in | Wt 256.6 lb

## 2023-09-04 DIAGNOSIS — E785 Hyperlipidemia, unspecified: Secondary | ICD-10-CM | POA: Insufficient documentation

## 2023-09-04 DIAGNOSIS — I130 Hypertensive heart and chronic kidney disease with heart failure and stage 1 through stage 4 chronic kidney disease, or unspecified chronic kidney disease: Secondary | ICD-10-CM | POA: Insufficient documentation

## 2023-09-04 DIAGNOSIS — E1122 Type 2 diabetes mellitus with diabetic chronic kidney disease: Secondary | ICD-10-CM | POA: Diagnosis not present

## 2023-09-04 DIAGNOSIS — Z95818 Presence of other cardiac implants and grafts: Secondary | ICD-10-CM

## 2023-09-04 DIAGNOSIS — Z9889 Other specified postprocedural states: Secondary | ICD-10-CM | POA: Diagnosis not present

## 2023-09-04 DIAGNOSIS — I5022 Chronic systolic (congestive) heart failure: Secondary | ICD-10-CM | POA: Diagnosis present

## 2023-09-04 DIAGNOSIS — I4819 Other persistent atrial fibrillation: Secondary | ICD-10-CM

## 2023-09-04 DIAGNOSIS — N183 Chronic kidney disease, stage 3 unspecified: Secondary | ICD-10-CM | POA: Insufficient documentation

## 2023-09-04 DIAGNOSIS — Z955 Presence of coronary angioplasty implant and graft: Secondary | ICD-10-CM | POA: Diagnosis not present

## 2023-09-04 DIAGNOSIS — I2510 Atherosclerotic heart disease of native coronary artery without angina pectoris: Secondary | ICD-10-CM | POA: Insufficient documentation

## 2023-09-04 DIAGNOSIS — Z952 Presence of prosthetic heart valve: Secondary | ICD-10-CM | POA: Insufficient documentation

## 2023-09-04 DIAGNOSIS — Z7901 Long term (current) use of anticoagulants: Secondary | ICD-10-CM | POA: Diagnosis not present

## 2023-09-04 DIAGNOSIS — Z79899 Other long term (current) drug therapy: Secondary | ICD-10-CM | POA: Diagnosis not present

## 2023-09-04 DIAGNOSIS — I252 Old myocardial infarction: Secondary | ICD-10-CM | POA: Diagnosis not present

## 2023-09-04 DIAGNOSIS — I4821 Permanent atrial fibrillation: Secondary | ICD-10-CM | POA: Insufficient documentation

## 2023-09-04 DIAGNOSIS — I080 Rheumatic disorders of both mitral and aortic valves: Secondary | ICD-10-CM | POA: Diagnosis not present

## 2023-09-04 DIAGNOSIS — Z7984 Long term (current) use of oral hypoglycemic drugs: Secondary | ICD-10-CM | POA: Diagnosis not present

## 2023-09-04 LAB — BASIC METABOLIC PANEL
Anion gap: 12 (ref 5–15)
BUN: 51 mg/dL — ABNORMAL HIGH (ref 8–23)
CO2: 24 mmol/L (ref 22–32)
Calcium: 9.5 mg/dL (ref 8.9–10.3)
Chloride: 97 mmol/L — ABNORMAL LOW (ref 98–111)
Creatinine, Ser: 1.85 mg/dL — ABNORMAL HIGH (ref 0.61–1.24)
GFR, Estimated: 37 mL/min — ABNORMAL LOW (ref >60)
Glucose, Bld: 130 mg/dL — ABNORMAL HIGH (ref 70–99)
Potassium: 5 mmol/L (ref 3.5–5.1)
Sodium: 133 mmol/L — ABNORMAL LOW (ref 135–145)

## 2023-09-04 NOTE — Patient Instructions (Signed)
Visit Information  Thank you for taking time to visit with me today. Please don't hesitate to contact me if I can be of assistance to you.   Following are the goals we discussed today:   Goals Addressed             This Visit's Progress    patient will be ableto manage his congestive heart failure & other cardiac illnesses at home- care coordination RN CM   On track    Interventions Today    Flowsheet Row Most Recent Value  Chronic Disease   Chronic disease during today's visit Congestive Heart Failure (CHF), Hypertension (HTN), Atrial Fibrillation (AFib)  General Interventions   General Interventions Discussed/Reviewed General Interventions Discussed, Doctor Visits, Community Resources  Doctor Visits Discussed/Reviewed Doctor Visits Discussed, PCP  PCP/Specialist Visits Compliance with follow-up visit  Exercise Interventions   Exercise Discussed/Reviewed Exercise Discussed, Physical Activity, Weight Managment  Physical Activity Discussed/Reviewed Physical Activity Discussed  Weight Management Weight maintenance  Mental Health Interventions   Mental Health Discussed/Reviewed Mental Health Discussed, Coping Strategies  Nutrition Interventions   Nutrition Discussed/Reviewed Nutrition Discussed, Fluid intake, Decreasing salt  Pharmacy Interventions   Pharmacy Dicussed/Reviewed Pharmacy Topics Discussed  Safety Interventions   Safety Discussed/Reviewed Safety Discussed, Home Safety  Home Safety Assistive Devices  Advanced Directive Interventions   Advanced Directives Discussed/Reviewed Advanced Directives Discussed              Our next appointment is by telephone on 10/09/23 at 1100  Please call the care guide team at [phone] if you need to cancel or reschedule your appointment.   If you are experiencing a Mental Health or Behavioral Health Crisis or need someone to talk to, please call the Suicide and Crisis Lifeline: 988 call the [place] National Suicide Prevention  Lifeline: 208-088-6120 or TTY: 667-155-7678 TTY 505 065 2373) to talk to a trained counselor call 1-800-273-TALK (toll free, 24 hour hotline) call the [facility]: [phone] call 911   Patient verbalizes understanding of instructions and care plan provided today and agrees to view in MyChart. Active MyChart status and patient understanding of how to access instructions and care plan via MyChart confirmed with patient.     The patient has been provided with contact information for the care management team and has been advised to call with any health related questions or concerns.   Darriel Utter L. Noelle Penner, RN, BSN, CCM [facility] Care Management Community Coordinator Office number [phone]

## 2023-09-04 NOTE — Patient Instructions (Addendum)
Thank you for coming in today  If you had labs drawn today, any labs that are abnormal the clinic will call you No news is good news  Medications: No changes   Follow up appointments:  Your physician recommends that you schedule a follow-up appointment in:  3-4 months With Dr. Earlean Shawl will receive a reminder letter in the mail a few months in advance. If you don't receive a letter, please call our office to schedule the follow-up appointment.    Do the following things EVERYDAY: Weigh yourself in the morning before breakfast. Write it down and keep it in a log. Take your medicines as prescribed Eat low salt foods--Limit salt (sodium) to 2000 mg per day.  Stay as active as you can everyday Limit all fluids for the day to less than 2 liters   At the [facility], you and your health needs are our priority. As part of our continuing mission to provide you with exceptional heart care, we have created designated Provider Care Teams. These Care Teams include your primary Cardiologist (physician) and Advanced Practice Providers (APPs- Physician Assistants and Nurse Practitioners) who all work together to provide you with the care you need, when you need it.   You may see any of the following providers on your designated Care Team at your next follow up: Dr Arvilla Meres Dr Marca Ancona Dr. Marcos Eke, NP Robbie Lis, [place] [facility] [place], [place] Brynda Peon, NP Karle Plumber, PharmD   Please be sure to bring in all your medications bottles to every appointment.    Thank you for choosing [facility]  If you have any questions or concerns before your next appointment please send [place] a message through [place] or call our office at [phone].    TO LEAVE A MESSAGE FOR THE NURSE SELECT OPTION 2, PLEASE LEAVE A MESSAGE INCLUDING: YOUR NAME DATE OF BIRTH CALL BACK NUMBER REASON FOR  CALL**this is important as we prioritize the call backs  YOU WILL RECEIVE A CALL BACK THE SAME DAY AS LONG AS YOU CALL BEFORE 4:00 PM

## 2023-09-04 NOTE — Progress Notes (Signed)
[facility] Note   PCP: [name], [name], MD Primary Cardiologist: [name], MD  HF Cardiology: Dr. Shirlee Latch  HPI:  Ryan Garner is a 78 y.o. with a history of CAD with STEMI 2021 and DES to LCx, permanent atrial fibrillation, severe Ryan, HTN, HLD, sleep apnea, and HFpEF.    Admitted 2/24 with A/C HFpEF and RV failure. Echo showed EF 50-55%, D-shaped septum, moderate-severe RV enlargement with moderate RV dysfunction, PASP 69, severe biatrial enlargement, severe mitral regurgitation with restricted posterior mitral leaflet, EROA 0.51 cm2 by PISA, moderate AS with mean gradient 21 mmHg/AVA 1.2 cm^2, IVC dilated. Diuresed with IV lasix with poor response, hypotension and worsening renal function. Dobutamine added for RV support. Diuresis improved. Overall diuresed > 90 pounds. Transitioned to torsemide 80 mg twice daily.  Hospitalization complicated by development of acute abdominal pain. Sent for stat CT abdomen/pelvis and this showed spontaneous rectus sheath hematoma. Anticoagulants held w/ gradual improvement. Anticoagulants ultimately restarted.  Once stabilized, he underwent TEE to further assess his Ryan. This showed LV EF 55%, D-shaped septum, moderate RV dysfunction, severe functional Ryan (suspect atrial functional, also significant posterior leaflet restriction), mild TR, moderate aortic stenosis. Structural heart team consulted. On 10/10/22, he underwent successful mTEER with residual 1+ Ryan. He was discharged home on 10/11/22 after a prolonged hospitalization. D/c wt 225 lb.  TEE in 6/24 showed EF 45-50%, D-shaped septum, RV moderately dilated/moderately dysfunctional, s/p mTEER with moderate Ryan and mean gradient 5 mmHg, severe AS with AVA 0.88 by continuity equation and mean gradient 27 mmHg.   LHC in 7/24 showed 60% D1, 70% OM1, patent LCx stent.  TAVR was done in 10/24 with 29 mm Edwards Sapien 3 THV.  Post-TAVR echo in 10/24 showed EF 45-50%, D-shaped septum with moderate RV  enlargement and moderately decreased RV systolic function, PASP 60, severe LAE, s/p Mitraclip x 2 with mean gradient 3 and moderate Ryan, s/p TAVR with mean gradient 10 mmHg and no significant perivalvular leakage.   Today he returns for HF follow up. Overall feeling fine. He has SOB walking uphill but otherwise no dyspnea. He feels better after TAVR, energy has improved. Has occasional LE edema. Denies palpitations, abnormal bleeding, CP, dizziness, edema, or PND/Orthopnea. Appetite ok. No fever or chills. Weight at home 245 pounds. Taking all medications.   Labs (4/24): hgb 9.9, K 4.3, creatinine 1.55 Labs (5/24): LDL 42 Labs (10/24): K 4.3, creatinine 2.21 Labs (07/02/23): K 4.2, creatinine 1.84  Review of Systems: All systems reviewed and negative except as per HPI.   PMH: 1. Atrial fibrillation: Permanent.  2. H/o COVID-19 infection 3. CAD: STEMI in 2021 with DES to 99% mLCx.   - LHC (7/24): 60% D1, 70% OM1, patent LCx stent. 4. Type 2 diabetes 5. HTN 6. Hyperlipidemia 7. OSA  8. ABIs normal in 2/24.  9. Paralyzed vocal cords with hoarseness 10. Mitral regurgitation: ?Atrial functional  - Echo (2/24): EF 50-55%, D-shaped septum, moderate-severe RV enlargement with moderate RV dysfunction, PASP 69, severe biatrial enlargement, severe mitral regurgitation with restricted posterior mitral leaflet, EROA 0.51 cm2 by PISA, moderate AS with mean gradient 21 mmHg/AVA 1.2 cm^2, IVC dilated. - TEE (3/24): LV EF 55%, D-shaped septum, moderate RV dysfunction, severe functional Ryan (suspect atrial functional, also significant posterior leaflet restriction), mild TR, moderate aortic stenosis.  - mTEER (3/24) with 2 Pascal devices.  - Echo (10/24): EF 45-50%, D-shaped septum with moderate RV enlargement and moderately decreased RV systolic function, PASP 60, severe LAE,  s/p Mitraclip x 2 with mean gradient 3 and moderate Ryan, s/p TAVR with mean gradient 10 mmHg and no significant perivalvular leakage.   11. Chronic diastolic CHF with prominent RV dysfunction: Echo (4/24) with EF 50-55%, mild-moderate Ryan s/p mTEER with mean gradient 6 mmHg, severe AS with mean gradient 21 mmHg, AVA 0.8 cm^2, DI 0.25.  - Echo (10/24): EF 45-50%, D-shaped septum with moderate RV enlargement and moderately decreased RV systolic function, PASP 60, severe LAE, s/p Mitraclip x 2 with mean gradient 3 and moderate Ryan, s/p TAVR with mean gradient 10 mmHg and no significant perivalvular leakage.  12. Aortic stenosis: Possible low flow/low gradient severe AS by 4/24 echo.  - TEE (6/24): EF 45-50%, D-shaped septum, RV moderately dilated/moderately dysfunctional, s/p mTEER with moderate Ryan and mean gradient 5 mmHg, severe AS with AVA 0.88 by continuity equation and mean gradient 27 mmHg.   - TAVR 10/24: 29 mm Edwards Sapien 3 THV. - Echo (10/24): Post-TAVR, EF 45-50%, D-shaped septum with moderate RV enlargement and moderately decreased RV systolic function, PASP 60, severe LAE, s/p Mitraclip x 2 with mean gradient 3 and moderate Ryan, s/p TAVR with mean gradient 10 mmHg and no significant perivalvular leakage.  13. Rectus sheath hematoma: Spontaneous, 3/24.  14. CKD stage 3  Current Outpatient Medications  Medication Sig Dispense Refill   apixaban (ELIQUIS) 5 MG TABS tablet Take 1 tablet (5 mg total) by mouth 2 (two) times daily. 180 tablet 1   ascorbic acid (VITAMIN C) 1000 MG tablet Take 1 tablet (1,000 mg total) by mouth daily. 60 tablet 1   atorvastatin (LIPITOR) 80 MG tablet Take 1 tablet by mouth once daily 90 tablet 3   dapagliflozin propanediol (FARXIGA) 10 MG TABS tablet Take 1 tablet (10 mg total) by mouth in the morning. 90 tablet 3   fluticasone (FLONASE) 50 MCG/ACT nasal spray Place 2 sprays into both nostrils daily. 16 g 6   Multiple Vitamin (MULTIVITAMIN WITH MINERALS) TABS tablet Take 1 tablet by mouth daily.     nitroGLYCERIN (NITROSTAT) 0.4 MG SL tablet Place 1 tablet (0.4 mg total) under the tongue every 5  (five) minutes x 3 doses as needed for chest pain. 25 tablet 2   potassium chloride SA (KLOR-CON M20) 20 MEQ tablet Take 1 tablet (20 mEq total) by mouth daily. 120 tablet 5   spironolactone (ALDACTONE) 25 MG tablet Take 25 mg by mouth daily.     torsemide (DEMADEX) 20 MG tablet Take 3 tablets (60 mg total) by mouth 2 (two) times daily. 540 tablet 3   diclofenac Sodium (VOLTAREN) 1 % GEL Apply 2 g topically 4 (four) times daily. (Patient not taking: Reported on 09/04/2023) 50 g 2   neomycin-bacitracin-polymyxin [phone] OINT Apply 1 Application topically 3 (three) times daily as needed (wounds/bruises). (Patient not taking: Reported on 09/04/2023)     No current facility-administered medications for this encounter.   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  Tobacco Use   Smoking status: Former    Current packs/day: 0.00    Types: Cigarettes    Quit date: 04/02/1959    Years since quitting: 64.4   Smokeless tobacco: Never  Vaping Use   Vaping status: Never Used  Substance and Sexual Activity   Alcohol use: No   Drug use: No   Sexual activity: Yes  Other Topics Concern   Not on file  Social History Narrative   Not on file   Social Drivers of Health   Financial Resource Strain: Low Risk  (10/19/2022)   Overall Financial Resource Strain (CARDIA)    Difficulty of Paying Living Expenses: Not hard at all  Food Insecurity: No Food Insecurity (10/19/2022)   Hunger Vital Sign    Worried  About Running Out of Food in the Last Year: Never true    Ran Out of Food in the Last Year: Never true  Transportation Needs: No Transportation Needs (10/19/2022)   PRAPARE - Administrator, Civil Service (Medical): No    Lack of Transportation (Non-Medical): No  Physical Activity: Unknown (10/14/2022)   Exercise Vital Sign    Days of Exercise per Week: 0 days    Minutes of Exercise per Session: Not on file  Recent Concern: Physical Activity - Inactive (10/14/2022)   Exercise Vital Sign    Days of Exercise per Week: 0 days    Minutes of Exercise per Session: 30 min  Stress: No Stress Concern Present (10/19/2022)   [name]    Feeling of Stress : Not at all  Social Connections: Moderately Integrated (10/14/2022)   Social Connection and Isolation Panel [NHANES]    Frequency of Communication with Friends and Family: More than three times a week    Frequency of Social Gatherings with Friends and Family: Once a week    Attends Religious Services: More than 4 times per year    Active Member of [employer] or Organizations: Yes    Attends Banker Meetings: More than 4 times per year    Marital Status: Widowed  Intimate Partner Violence: Not At Risk (10/07/2022)   Humiliation, Afraid, Rape, and Kick questionnaire    Fear of Current or Ex-Partner: No    Emotionally Abused: No    Physically Abused: No    Sexually Abused: No   Family History  Problem Relation Age of Onset   Cancer Mother        lungs   Coronary artery disease Mother    Diabetes Brother    Heart attack Brother    Diabetes Maternal Grandmother    Arthritis Father    BP 114/78 (BP Location: Left Arm, Patient Position: Sitting)   Pulse 80   Ht 5\' 7"  (1.702 m)   Wt 116.4 kg (256 lb 9.6 oz)   SpO2 98%   BMI 40.19 kg/m   Wt Readings from Last 3 Encounters:  09/04/23 116.4 kg (256 lb 9.6 oz)  08/22/23 119.9 kg (264 lb 6.4 oz)  08/07/23  117 kg (258 lb)   PHYSICAL EXAM: General:  NAD. No resp difficulty, walked into clinic HEENT: +hoarse, 2/2 to vocal chord paralysis  Neck: Supple. No JVD. Cor: Irregular rate & rhythm. No rubs, gallops or murmurs. Lungs: Clear Abdomen: Soft, obese, nontender, nondistended.  Extremities: No cyanosis, clubbing, rash, edema Neuro: Alert & oriented x 3, moves all 4 extremities w/o difficulty. Affect pleasant.  ASSESSMENT &  PLAN: 1. Chronic HF with mid range EF: With prominent RV dysfunction. Hospitalization 2/24 with marked volume overload with low output, requiring RV inotropic support with dobutamine, he had >90 lbs off via diuresis.  Severe mitral regurgitation contributed to CHF, now s/p mTEER. Echo in 4/24 showed EF 50-55%, mild-moderate Ryan s/p mTEER with mean gradient 6 mmHg, severe AS with mean gradient 21 mmHg, AVA 0.8 cm^2, DI 0.25. TEE in 6/24 confirmed severe AS and he had TAVR in 10/24.  Post-TAVR echo in 10/24 showed EF 45-50%, D-shaped septum with moderate RV enlargement and moderately decreased RV systolic function, PASP 60, severe LAE, s/p Mitraclip x 2 with mean gradient 3 and moderate Ryan, s/p TAVR with mean gradient 10 mmHg and no significant perivalvular leakage.  NYHA class II, feels better post-TAVR.  He does not look volume overloaded on exam. - Continue torsemide 60 mg bid. BMET today. - Continue spironolactone 25 mg daily.  - Continue Farxiga 10 mg daily. No GU symptoms. 2. Mitral regurgitation: Severe functional Ryan by echo 2/24.  TEE in 3/24 showed LV EF 55%, D-shaped septum, moderate RV dysfunction, severe functional Ryan (suspect atrial functional, also significant posterior leaflet restriction), mild TR, moderate aortic stenosis.  He is now s/p mTEER with 2 Pascal devices.  Echo in 4/24 showed mean MV gradient 6 mmHg with mild-moderate residual Ryan.  Echo in 10/24 with mean gradient 3, moderate residual Ryan.  - Will need antibiotic prophylaxis with dental work.  3. CKD stage 3:  Most recent BMET reviewed from 07/02/23, K 4.2 and SCr 1.84. Repeat BMET today. 4. Atrial fibrillation: Permanent, has been seen by EP in the past. Not likely to successfully cardiovert and stay in NSR.   - Continue Eliquis. No bleeding issues. 5. CAD:  STEMI with DES to mLCx in 10/21. LHC in 7/24 with 60% D1, 70% OM1, patent LCx stent.  No chest pain.  - Continue atorvastatin, good lipids in 5/24.  - No ASA with Eliquis use.  6. Aortic stenosis: Severe AS now s/p TAVR with 29 mm Edwards Sapien 3 THV in 10/24.  Echo post-TAVR showed mean gradient 10 mmHg and no peri-valvular leakage.   Follow up in 3-4 months with Dr. Park Liter, FNP 09/04/23

## 2023-09-11 ENCOUNTER — Ambulatory Visit: Payer: Medicare Other | Admitting: Family Medicine

## 2023-09-11 ENCOUNTER — Telehealth (HOSPITAL_COMMUNITY): Payer: Self-pay

## 2023-09-11 ENCOUNTER — Encounter: Payer: Self-pay | Admitting: Family Medicine

## 2023-09-11 VITALS — BP 126/71 | HR 72 | Temp 97.7°F | Ht 67.0 in | Wt 261.0 lb

## 2023-09-11 DIAGNOSIS — I10 Essential (primary) hypertension: Secondary | ICD-10-CM

## 2023-09-11 DIAGNOSIS — Z7984 Long term (current) use of oral hypoglycemic drugs: Secondary | ICD-10-CM | POA: Diagnosis not present

## 2023-09-11 DIAGNOSIS — Z125 Encounter for screening for malignant neoplasm of prostate: Secondary | ICD-10-CM

## 2023-09-11 DIAGNOSIS — I2510 Atherosclerotic heart disease of native coronary artery without angina pectoris: Secondary | ICD-10-CM | POA: Diagnosis not present

## 2023-09-11 DIAGNOSIS — J324 Chronic pansinusitis: Secondary | ICD-10-CM

## 2023-09-11 DIAGNOSIS — E1169 Type 2 diabetes mellitus with other specified complication: Secondary | ICD-10-CM

## 2023-09-11 DIAGNOSIS — E1159 Type 2 diabetes mellitus with other circulatory complications: Secondary | ICD-10-CM

## 2023-09-11 DIAGNOSIS — J321 Chronic frontal sinusitis: Secondary | ICD-10-CM

## 2023-09-11 DIAGNOSIS — I4819 Other persistent atrial fibrillation: Secondary | ICD-10-CM

## 2023-09-11 DIAGNOSIS — E782 Mixed hyperlipidemia: Secondary | ICD-10-CM | POA: Diagnosis not present

## 2023-09-11 LAB — LIPID PANEL

## 2023-09-11 LAB — BAYER DCA HB A1C WAIVED: HB A1C (BAYER DCA - WAIVED): 7 % — ABNORMAL HIGH (ref 4.8–5.6)

## 2023-09-11 MED ORDER — FLUTICASONE PROPIONATE 50 MCG/ACT NA SUSP
2.0000 | Freq: Every day | NASAL | 6 refills | Status: DC
Start: 2023-09-11 — End: 2024-03-31

## 2023-09-11 MED ORDER — AMOXICILLIN-POT CLAVULANATE 875-125 MG PO TABS
1.0000 | ORAL_TABLET | Freq: Two times a day (BID) | ORAL | 0 refills | Status: DC
Start: 2023-09-11 — End: 2023-09-17

## 2023-09-11 NOTE — Progress Notes (Addendum)
 BP 126/71   Pulse 72   Temp 97.7 F (36.5 C)   Ht 5\' 7"  (1.702 m)   Wt 261 lb (118.4 kg)   SpO2 99%   BMI 40.88 kg/m    Subjective:   Patient ID: Ryan Garner, male    DOB: 04-Sep-1945, 78 y.o.   MRN: [id number]  HPI: Ryan Garner is a 78 y.o. male presenting on 09/11/2023 for Medical Management of Chronic Issues (Sinus pressure/Arthritis/Feet and hands burn, tingle, pins and needle, mostly at night. )   HPI Type 2 diabetes mellitus Patient comes in today for recheck of his diabetes. Patient has been currently taking [place]. Patient is currently on an ACE inhibitor/ARB. Patient has not seen an ophthalmologist this year. Patient is having some new tingling and burning in his feet that comes and goes.. The symptom started onset as an adult hypertension and hyperlipidemia and CAD and A-fib ARE RELATED TO DM   Hyperlipidemia and CAD and A-fib recheck Patient is coming in for recheck of his hyperlipidemia. The patient is currently taking atorvastatin. They deny any issues with myalgias or history of liver damage from it. They deny any focal numbness or weakness or chest pain.   Hypertension Patient is currently on spironolactone and Eliquis and torsemide, and their blood pressure today is hypertension and hyperlipidemia. Patient denies any lightheadedness or dizziness. Patient denies headaches, blurred vision, chest pains, shortness of breath, or weakness. Denies any side effects from medication and is content with current medication.   Patient is coming in today with cough and congestion and hoarseness and sore throat.  He was treated for this 3 weeks ago and said it got a little better but not completely cleared and he is just not completely getting over it.  He says he is having some sinus pressure and headache and drainage as well.  Was treated with doxycycline and prednisone.  Relevant past medical, surgical, family and social history reviewed and updated as indicated. Interim  medical history since our last visit reviewed. Allergies and medications reviewed and updated.  Review of Systems  Constitutional:  Negative for chills and fever.  Respiratory:  Negative for shortness of breath and wheezing.   Cardiovascular:  Positive for leg swelling. Negative for chest pain and palpitations.  Musculoskeletal:  Negative for back pain and gait problem.  Skin:  Negative for rash.  Neurological:  Positive for numbness. Negative for dizziness, weakness and light-headedness.  All other systems reviewed and are negative.   Per HPI unless specifically indicated above   Allergies as of 09/11/2023       Reactions   Flecainide Other (See Comments)   dizziness, shortness of breath, blurred vision   Metoprolol Tartrate Itching   Rofecoxib Rash, Other (See Comments)   (Vioxx)        Medication List        Accurate as of September 11, 2023 11:31 AM. If you have any questions, ask your nurse or doctor.          amoxicillin-clavulanate 875-125 MG tablet Commonly known as: AUGMENTIN Take 1 tablet by mouth 2 (two) times daily. Started by: [name] [address]   apixaban 5 MG Tabs tablet Commonly known as: ELIQUIS Take 1 tablet (5 mg total) by mouth 2 (two) times daily.   ascorbic acid 1000 MG tablet Commonly known as: VITAMIN C Take 1 tablet (1,000 mg total) by mouth daily.   atorvastatin 80 MG tablet Commonly known as: LIPITOR Take 1 tablet by  mouth once daily   dapagliflozin propanediol 10 MG Tabs tablet Commonly known as: Farxiga Take 1 tablet (10 mg total) by mouth in the morning.   diclofenac Sodium 1 % Gel Commonly known as: Voltaren Apply 2 g topically 4 (four) times daily.   fluticasone 50 MCG/ACT nasal spray Commonly known as: FLONASE Place 2 sprays into both nostrils daily.   multivitamin with minerals Tabs tablet Take 1 tablet by mouth daily.   neomycin-bacitracin-polymyxin [phone] Oint Apply 1 Application topically 3 (three) times  daily as needed (wounds/bruises).   nitroGLYCERIN 0.4 MG SL tablet Commonly known as: NITROSTAT Place 1 tablet (0.4 mg total) under the tongue every 5 (five) minutes x 3 doses as needed for chest pain.   potassium chloride SA 20 MEQ tablet Commonly known as: [place]-Con M20 Take 1 tablet (20 mEq total) by mouth daily.   spironolactone 25 MG tablet Commonly known as: ALDACTONE Take 25 mg by mouth daily.   torsemide 20 MG tablet Commonly known as: DEMADEX Take 3 tablets (60 mg total) by mouth 2 (two) times daily.         Objective:   BP 126/71   Pulse 72   Temp 97.7 F (36.5 C)   Ht 5\' 7"  (1.702 m)   Wt 261 lb (118.4 kg)   SpO2 99%   BMI 40.88 kg/m   Wt Readings from Last 3 Encounters:  09/11/23 261 lb (118.4 kg)  09/04/23 256 lb 9.6 oz (116.4 kg)  08/22/23 264 lb 6.4 oz (119.9 kg)    Physical Exam Vitals and nursing note reviewed.  Constitutional:      General: He is not in acute distress.    Appearance: He is well-developed. He is not diaphoretic.  HENT:     Right Ear: Tympanic membrane and ear canal normal.     Left Ear: Tympanic membrane and ear canal normal.     Nose:     Right Sinus: No maxillary sinus tenderness or frontal sinus tenderness.     Left Sinus: No maxillary sinus tenderness (No palpable sinus pressure on exam) or frontal sinus tenderness.  Eyes:     General: No scleral icterus.    Conjunctiva/sclera: Conjunctivae normal.  Neck:     Thyroid: No thyromegaly.  Cardiovascular:     Rate and Rhythm: Normal rate and regular rhythm.     Heart sounds: Normal heart sounds. No murmur heard. Pulmonary:     Effort: Pulmonary effort is normal. No respiratory distress.     Breath sounds: Normal breath sounds. No wheezing.  Musculoskeletal:        General: Normal range of motion.     Cervical back: Neck supple.  Lymphadenopathy:     Cervical: No cervical adenopathy.  Skin:    General: Skin is warm and dry.     Findings: No rash.  Neurological:      Mental Status: He is alert and oriented to person, place, and time.     Coordination: Coordination normal.  Psychiatric:        Behavior: Behavior normal.       Assessment & Plan:   Problem List Items Addressed This Visit       Cardiovascular and Mediastinum   Hypertension   Atrial fibrillation (HCC)   Coronary artery disease due to type 2 diabetes mellitus (HCC)     Respiratory   Chronic frontal sinusitis   Relevant Medications   amoxicillin-clavulanate (AUGMENTIN) 875-125 MG tablet   fluticasone (FLONASE) 50 MCG/ACT nasal spray  Endocrine   Type 2 diabetes mellitus (HCC) - Primary   Relevant Orders   Bayer DCA Hb A1c Waived (Completed)   CBC with Differential/Platelet (Completed)   Microalbumin/Creatinine Ratio, Urine (Completed)     Other   Hyperlipidemia   Relevant Orders   Lipid panel (Completed)   Other Visit Diagnoses       Prostate cancer screening       Relevant Orders   PSA, total and free (Completed)     Chronic pansinusitis       Relevant Medications   amoxicillin-clavulanate (AUGMENTIN) 875-125 MG tablet   fluticasone (FLONASE) 50 MCG/ACT nasal spray       Will send Augmentin for him and check his blood work on the way out.  Check his A1c as well.  Patient has subjective chronic sinus pressure, so I resending Augmentin for him. Follow up plan: Return in about 3 months (around 12/09/2023), or if symptoms worsen or fail to improve, for diabetes.  Counseling provided for all of the vaccine components Orders Placed This Encounter  Procedures   Bayer DCA Hb A1c Waived   CBC with Differential/Platelet   Lipid panel   PSA, total and free   Microalbumin/Creatinine Ratio, Urine    Arville Care, MD [place] [facility] 09/11/2023, 11:31 AM

## 2023-09-11 NOTE — Telephone Encounter (Signed)
Spoke with patient regarding the following results. Patient made aware and patient verbalized understanding.   Medication list updated to reflect changes.

## 2023-09-11 NOTE — Telephone Encounter (Signed)
-----   Message from Jacklynn Ganong sent at 09/04/2023  4:20 PM EST ----- Labs stable, renal function improved.  Can stop his KCL as K is on the higher end of normal

## 2023-09-12 LAB — CBC WITH DIFFERENTIAL/PLATELET
Basophils Absolute: 0 10*3/uL (ref 0.0–0.2)
Basos: 0 %
EOS (ABSOLUTE): 0.3 10*3/uL (ref 0.0–0.4)
Eos: 2 %
Hematocrit: 41.8 % (ref 37.5–51.0)
Hemoglobin: 14.4 g/dL (ref 13.0–17.7)
Immature Grans (Abs): 0.1 10*3/uL (ref 0.0–0.1)
Immature Granulocytes: 1 %
Lymphocytes Absolute: 1.2 10*3/uL (ref 0.7–3.1)
Lymphs: 12 %
MCH: 30.8 pg (ref 26.6–33.0)
MCHC: 34.4 g/dL (ref 31.5–35.7)
MCV: 89 fL (ref 79–97)
Monocytes Absolute: 1 10*3/uL — ABNORMAL HIGH (ref 0.1–0.9)
Monocytes: 10 %
Neutrophils Absolute: 7.8 10*3/uL — ABNORMAL HIGH (ref 1.4–7.0)
Neutrophils: 75 %
Platelets: 222 10*3/uL (ref 150–450)
RBC: 4.68 x10E6/uL (ref 4.14–5.80)
RDW: 12.5 % (ref 11.6–15.4)
WBC: 10.3 10*3/uL (ref 3.4–10.8)

## 2023-09-12 LAB — LIPID PANEL
Cholesterol, Total: 147 mg/dL (ref 100–199)
HDL: 60 mg/dL (ref >39)
LDL CALC COMMENT:: 2.5 ratio (ref 0.0–5.0)
LDL Chol Calc (NIH): 69 mg/dL (ref 0–99)
Triglycerides: 96 mg/dL (ref 0–149)
VLDL Cholesterol Cal: 18 mg/dL (ref 5–40)

## 2023-09-12 LAB — PSA, TOTAL AND FREE
PSA, Free Pct: 14.5 %
PSA, Free: 0.16 ng/mL
Prostate Specific Ag, Serum: 1.1 ng/mL (ref 0.0–4.0)

## 2023-09-12 LAB — MICROALBUMIN / CREATININE URINE RATIO
Creatinine, Urine: 15.4 mg/dL
Microalb/Creat Ratio: <19 mg/g{creat} (ref 0–29)
Microalbumin, Urine: <3 ug/mL

## 2023-09-17 ENCOUNTER — Ambulatory Visit (INDEPENDENT_AMBULATORY_CARE_PROVIDER_SITE_OTHER): Payer: Medicare Other | Admitting: Family Medicine

## 2023-09-17 ENCOUNTER — Ambulatory Visit: Payer: Medicare Other

## 2023-09-17 ENCOUNTER — Ambulatory Visit (HOSPITAL_COMMUNITY)
Admission: RE | Admit: 2023-09-17 | Discharge: 2023-09-17 | Disposition: A | Discharge status: Home / Self Care | Payer: Medicare Other | Source: Ambulatory Visit | Attending: Family Medicine | Admitting: Family Medicine

## 2023-09-17 ENCOUNTER — Other Ambulatory Visit: Payer: Self-pay | Admitting: Family Medicine

## 2023-09-17 ENCOUNTER — Ambulatory Visit (INDEPENDENT_AMBULATORY_CARE_PROVIDER_SITE_OTHER): Payer: Medicare Other

## 2023-09-17 VITALS — BP 125/75 | HR 85 | Temp 96.7°F | Ht 67.0 in | Wt 260.0 lb

## 2023-09-17 DIAGNOSIS — L03116 Cellulitis of left lower limb: Secondary | ICD-10-CM

## 2023-09-17 DIAGNOSIS — S8992XA Unspecified injury of left lower leg, initial encounter: Secondary | ICD-10-CM

## 2023-09-17 DIAGNOSIS — M1812 Unilateral primary osteoarthritis of first carpometacarpal joint, left hand: Secondary | ICD-10-CM | POA: Diagnosis not present

## 2023-09-17 DIAGNOSIS — S81812A Laceration without foreign body, left lower leg, initial encounter: Secondary | ICD-10-CM

## 2023-09-17 DIAGNOSIS — R2242 Localized swelling, mass and lump, left lower limb: Secondary | ICD-10-CM | POA: Insufficient documentation

## 2023-09-17 DIAGNOSIS — S6992XA Unspecified injury of left wrist, hand and finger(s), initial encounter: Secondary | ICD-10-CM | POA: Diagnosis not present

## 2023-09-17 DIAGNOSIS — L819 Disorder of pigmentation, unspecified: Secondary | ICD-10-CM | POA: Insufficient documentation

## 2023-09-17 DIAGNOSIS — M25532 Pain in left wrist: Secondary | ICD-10-CM

## 2023-09-17 DIAGNOSIS — M19042 Primary osteoarthritis, left hand: Secondary | ICD-10-CM | POA: Diagnosis not present

## 2023-09-17 DIAGNOSIS — M7989 Other specified soft tissue disorders: Secondary | ICD-10-CM | POA: Diagnosis not present

## 2023-09-17 MED ORDER — DOXYCYCLINE HYCLATE 100 MG PO TABS
100.0000 mg | ORAL_TABLET | Freq: Two times a day (BID) | ORAL | 0 refills | Status: AC
Start: 2023-09-17 — End: 2023-09-27

## 2023-09-17 NOTE — Progress Notes (Signed)
 Subjective:  Patient ID: Ryan Garner, male    DOB: 05-30-1946, 78 y.o.   MRN: [id number]  Patient Care Team: [name], [name], MD as PCP - General (Family Medicine) Pricilla Riffle, MD as PCP - Cardiology (Cardiology) Clinton Gallant, RN as [facility] Care Management   Chief Complaint:  Edema (Left lower leg after dropping a cabinet on his leg on Friday. ) and Wrist Pain (Left wrist pain- x 4 days)   HPI: Ryan Garner is a 78 y.o. male presenting on 09/17/2023 for Edema (Left lower leg after dropping a cabinet on his leg on Friday. ) and Wrist Pain (Left wrist pain- x 4 days)   Wrist Pain  The pain is present in the left wrist. This is a new problem. The current episode started in the past 7 days. There has been a history of trauma. The problem occurs constantly. The problem has been waxing and waning. The quality of the pain is described as aching and sharp. The pain is mild. Associated symptoms include a limited range of motion and stiffness. Pertinent negatives include no fever, inability to bear weight, itching, joint locking, joint swelling, numbness or tingling. The symptoms are aggravated by activity. He has tried nothing for the symptoms. The treatment provided no relief.  Leg Pain  The incident occurred 3 to 5 days ago. The incident occurred at home. The injury mechanism was a direct blow. The pain is present in the left leg. The quality of the pain is described as aching, burning and shooting. The pain is moderate. The pain has been Constant since onset. Pertinent negatives include no inability to bear weight, loss of motion, loss of sensation, muscle weakness, numbness or tingling. The symptoms are aggravated by movement, palpation and weight bearing. Treatments tried: dressing. The treatment provided no relief.  Laceration  The incident occurred 3 to 5 days ago. The laceration is located on the Left leg. The laceration mechanism was a blunt object. The pain is  moderate. The pain has been Fluctuating since onset. He reports no foreign bodies present. His tetanus status is UTD (less than 10 years ago).     Relevant past medical, surgical, family, and social history reviewed and updated as indicated.  Allergies and medications reviewed and updated. Data reviewed: Chart in Epic.   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 Excell Seltzer and Dr. 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 Clifton James & Dr. Laneta Simmers   Sleep apnea    STEMI (ST elevation myocardial infarction) [facility])     Past Surgical History:  Procedure Laterality Date   APPENDECTOMY     BUBBLE STUDY  10/02/2022   Procedure: BUBBLE STUDY;  Surgeon: Laurey Morale, MD;  Location: [facility] ENDOSCOPY;  Service: Cardiovascular;;   CHOLECYSTECTOMY N/A 03/02/2020   Procedure: LAPAROSCOPIC CHOLECYSTECTOMY;  Surgeon: Franky Macho, MD;  Location: [facility] ORS;  Service: General;  Laterality:  N/A;   CORONARY STENT INTERVENTION N/A 04/30/2020   Procedure: CORONARY STENT INTERVENTION;  Surgeon: Swaziland, Peter M, MD;  Location: [facility] INVASIVE CV LAB;  Service: Cardiovascular;  Laterality: N/A;   CORONARY/GRAFT ACUTE MI REVASCULARIZATION N/A 04/30/2020   Procedure: Coronary/Graft Acute MI Revascularization;  Surgeon: Swaziland, Peter M, MD;  Location: [facility] INVASIVE CV LAB;  Service: Cardiovascular;  Laterality: N/A;   Eyelid Surgery Bilateral    HERNIA REPAIR     I & D EXTREMITY Left 04/06/2022   Procedure: LEFT LEG DEBRIDEMENT;  Surgeon: Nadara Mustard, MD;  Location: [facility] OR;  Service: Orthopedics;  Laterality: Left;   I & D EXTREMITY Left 04/04/2022   Procedure: LEFT LEG DEBRIDEMENT;  Surgeon: Nadara Mustard, MD;  Location: [facility] OR;  Service: Orthopedics;  Laterality: Left;   INTRAOPERATIVE TRANSTHORACIC ECHOCARDIOGRAM N/A 04/23/2023   Procedure: INTRAOPERATIVE TRANSTHORACIC ECHOCARDIOGRAM;  Surgeon: Kathleene Hazel, MD;  Location: [facility] INVASIVE CV LAB;  Service: Open Heart Surgery;  Laterality: N/A;   knee tendons repair     LEFT HEART CATH AND CORONARY ANGIOGRAPHY N/A 04/30/2020   Procedure: LEFT HEART CATH AND CORONARY ANGIOGRAPHY;  Surgeon: Swaziland, Peter M, MD;  Location: [facility] INVASIVE CV LAB;  Service: Cardiovascular;  Laterality: N/A;   RIGHT HEART CATH N/A 10/03/2022   Procedure: RIGHT HEART CATH;  Surgeon: Laurey Morale, MD;  Location: [facility] INVASIVE CV LAB;  Service: Cardiovascular;  Laterality: N/A;   RIGHT HEART CATH N/A 12/26/2022   Procedure: RIGHT HEART CATH;  Surgeon: Laurey Morale, MD;  Location: [facility] INVASIVE CV LAB;  Service: Cardiovascular;  Laterality: N/A;   RIGHT/LEFT HEART CATH AND CORONARY ANGIOGRAPHY N/A 02/05/2023   Procedure: RIGHT/LEFT HEART CATH AND CORONARY ANGIOGRAPHY;  Surgeon: Laurey Morale, MD;  Location: [facility] INVASIVE CV LAB;  Service: Cardiovascular;  Laterality: N/A;   ROTATOR CUFF REPAIR Bilateral    TEE WITHOUT CARDIOVERSION N/A 10/02/2022   Procedure: TRANSESOPHAGEAL ECHOCARDIOGRAM (TEE);  Surgeon: Laurey Morale, MD;  Location: [facility] ENDOSCOPY;  Service: Cardiovascular;  Laterality: N/A;   TEE WITHOUT CARDIOVERSION N/A 10/10/2022   Procedure: TRANSESOPHAGEAL ECHOCARDIOGRAM;  Surgeon: Tonny Bollman, MD;  Location: [facility] INVASIVE CV LAB;  Service: Cardiovascular;  Laterality: N/A;   TEE WITHOUT CARDIOVERSION N/A 12/26/2022   Procedure: TRANSESOPHAGEAL ECHOCARDIOGRAM;  Surgeon: Laurey Morale, MD;  Location: [facility] INVASIVE CV LAB;  Service: Cardiovascular;  Laterality: N/A;   TONSILLECTOMY     TRANSCATHETER  AORTIC VALVE REPLACEMENT, TRANSFEMORAL N/A 04/23/2023   Procedure: Transcatheter Aortic Valve Replacement, Transfemoral;  Surgeon: Kathleene Hazel, MD;  Location: [facility] INVASIVE CV LAB;  Service: Open Heart Surgery;  Laterality: N/A;   TRANSCATHETER MITRAL EDGE TO EDGE REPAIR N/A 10/10/2022   Procedure: MITRAL VALVE REPAIR;  Surgeon: Tonny Bollman, MD;  Location: [facility] INVASIVE CV LAB;  Service: Cardiovascular;  Laterality: N/A;    Social History   Socioeconomic History   Marital status: Widowed    Spouse name: Karena Addison   Number of children: 2   Years of education: 20   Highest education level: Bachelor's degree (e.g., BA, AB, BS)  Occupational History   Occupation: Education officer, environmental    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  Other Topics Concern   Not on file  Social History Narrative   Not on file   Social Drivers of Health   Financial Resource Strain: Low Risk  (10/19/2022)   Overall Financial Resource Strain (CARDIA)    Difficulty of Paying Living Expenses: Not hard at all  Food Insecurity: No Food Insecurity (10/19/2022)   Hunger Vital Sign    Worried About Running Out of Food in the Last Year: Never true    Ran Out of Food in the Last Year: Never true  Transportation Needs: No Transportation Needs (10/19/2022)   PRAPARE - Administrator, Civil Service (Medical): No    Lack of Transportation (Non-Medical): No  Physical Activity: Unknown (10/14/2022)   Exercise Vital Sign    Days of Exercise per Week: 0 days    Minutes of Exercise per Session: Not on file  Recent Concern: Physical Activity - Inactive (10/14/2022)   Exercise  Vital Sign    Days of Exercise per Week: 0 days    Minutes of Exercise per Session: 30 min  Stress: No Stress Concern Present (10/19/2022)   [name]    Feeling of Stress : Not at all  Social Connections: Moderately Integrated (10/14/2022)   Social Connection and Isolation Panel [NHANES]    Frequency of Communication with Friends and Family: More than three times a week    Frequency of Social Gatherings with Friends and Family: Once a week    Attends Religious Services: More than 4 times per year    Active Member of [employer] or Organizations: Yes    Attends Banker Meetings: More than 4 times per year    Marital Status: Widowed  Intimate Partner Violence: Not At Risk (10/07/2022)   Humiliation, Afraid, Rape, and Kick questionnaire    Fear of Current or Ex-Partner: No    Emotionally Abused: No    Physically Abused: No    Sexually Abused: No    Outpatient Encounter Medications as of 09/17/2023  Medication Sig   apixaban (ELIQUIS) 5 MG TABS tablet Take 1 tablet (5 mg total) by mouth 2 (two) times daily.   ascorbic acid (VITAMIN C) 1000 MG tablet Take 1 tablet (1,000 mg total) by mouth daily.   atorvastatin (LIPITOR) 80 MG tablet Take 1 tablet by mouth once daily   dapagliflozin propanediol (FARXIGA) 10 MG TABS tablet Take 1 tablet (10 mg total) by mouth in the morning.   diclofenac Sodium (VOLTAREN) 1 % GEL Apply 2 g topically 4 (four) times daily.   doxycycline (VIBRA-TABS) 100 MG tablet Take 1 tablet (100 mg total) by mouth 2 (two) times daily for 10 days. 1 po bid   fluticasone (FLONASE) 50 MCG/ACT nasal spray Place 2 sprays into both nostrils daily.   Multiple Vitamin (MULTIVITAMIN WITH MINERALS) TABS tablet Take 1 tablet by mouth daily.   neomycin-bacitracin-polymyxin 3.5-([phone] OINT Apply 1 Application topically 3 (three) times daily as needed (wounds/bruises).   nitroGLYCERIN (NITROSTAT) 0.4 MG SL tablet  Place 1 tablet (0.4 mg total) under the tongue every 5 (five) minutes x 3 doses as needed for chest pain.   spironolactone (ALDACTONE) 25 MG tablet Take 25 mg by mouth daily.   torsemide (DEMADEX) 20 MG tablet Take 3 tablets (60 mg total) by mouth 2 (two) times daily.   [DISCONTINUED] amoxicillin-clavulanate (AUGMENTIN) 875-125  MG tablet Take 1 tablet by mouth 2 (two) times daily.   No facility-administered encounter medications on file as of 09/17/2023.    Allergies  Allergen Reactions   Flecainide Other (See Comments)    dizziness, shortness of breath, blurred vision   Metoprolol Tartrate Itching   Rofecoxib Rash and Other (See Comments)    (Vioxx)    Review of Systems  Constitutional:  Negative for activity change, appetite change, chills, diaphoresis, fatigue, fever and unexpected weight change.  Respiratory:  Negative for cough and shortness of breath.   Cardiovascular:  Positive for leg swelling (LLE). Negative for chest pain and palpitations.  Musculoskeletal:  Positive for arthralgias and stiffness.  Skin:  Positive for color change and wound. Negative for itching.  Neurological:  Negative for tingling, weakness and numbness.  All other systems reviewed and are negative.       Objective:  BP 125/75   Pulse 85   Temp (!) 96.7 F (35.9 C)   Ht 5\' 7"  (1.702 m)   Wt 260 lb (117.9 kg)   SpO2 99%   BMI 40.72 kg/m    Wt Readings from Last 3 Encounters:  09/17/23 260 lb (117.9 kg)  09/11/23 261 lb (118.4 kg)  09/04/23 256 lb 9.6 oz (116.4 kg)    Physical Exam Vitals and nursing note reviewed.  Constitutional:      General: He is not in acute distress.    Appearance: Normal appearance. He is obese. He is not ill-appearing, toxic-appearing or diaphoretic.  HENT:     Head: Normocephalic and atraumatic.  Cardiovascular:     Rate and Rhythm: Normal rate. Rhythm irregularly irregular.     Heart sounds: Murmur heard.     Systolic murmur is present with a grade of 2/6.      Comments: LLE worse than right with noted injury to anterior aspect of shin, skin tear with surrounding erythema. Reddish discoloration to LLE. Musculoskeletal:     Right lower leg: Edema present.     Left lower leg: Edema present.  Skin:    General: Skin is warm and dry.     Capillary Refill: Capillary refill takes less than 2 seconds.     Findings: Erythema and wound present.  Neurological:     Mental Status: He is alert.     Results for orders placed or performed in visit on 09/11/23  Bayer DCA Hb A1c Waived   Collection Time: 09/11/23 11:36 AM  Result Value Ref Range   HB A1C (BAYER DCA - WAIVED) 7.0 (H) 4.8 - 5.6 %  CBC with Differential/Platelet   Collection Time: 09/11/23 11:37 AM  Result Value Ref Range   WBC 10.3 3.4 - 10.8 x10E3/uL   RBC 4.68 4.14 - 5.80 x10E6/uL   Hemoglobin 14.4 13.0 - 17.7 g/dL   Hematocrit 08.6 57.8 - 51.0 %   MCV 89 79 - 97 fL   MCH 30.8 26.6 - 33.0 pg   MCHC 34.4 31.5 - 35.7 g/dL   RDW 46.9 62.9 - 52.8 %   Platelets 222 150 - 450 x10E3/uL   Neutrophils 75 Not Estab. %   Lymphs 12 Not Estab. %   Monocytes 10 Not Estab. %   Eos 2 Not Estab. %   Basos 0 Not Estab. %   Neutrophils Absolute 7.8 (H) 1.4 - 7.0 x10E3/uL   Lymphocytes Absolute 1.2 0.7 - 3.1 x10E3/uL   Monocytes Absolute 1.0 (H) 0.1 - 0.9 x10E3/uL   EOS (ABSOLUTE) 0.3 0.0 -  0.4 x10E3/uL   Basophils Absolute 0.0 0.0 - 0.2 x10E3/uL   Immature Granulocytes 1 Not Estab. %   Immature Grans (Abs) 0.1 0.0 - 0.1 x10E3/uL  Lipid panel   Collection Time: 09/11/23 11:37 AM  Result Value Ref Range   Cholesterol, Total 147 100 - 199 mg/dL   Triglycerides 96 0 - 149 mg/dL   HDL 60 >69 mg/dL   VLDL Cholesterol Cal 18 5 - 40 mg/dL   LDL Chol Calc (NIH) 69 0 - 99 mg/dL   Chol/HDL Ratio 2.5 0.0 - 5.0 ratio  PSA, total and free   Collection Time: 09/11/23 11:37 AM  Result Value Ref Range   Prostate Specific Ag, Serum 1.1 0.0 - 4.0 ng/mL   PSA, Free 0.16 N/A ng/mL   PSA, Free Pct 14.5 %   Microalbumin/Creatinine Ratio, Urine   Collection Time: 09/11/23 11:43 AM  Result Value Ref Range   Creatinine, Urine 15.4 Not Estab. mg/dL   Microalbumin, Urine <6.2 Not Estab. ug/mL   Microalb/Creat Ratio <19 0 - 29 mg/g creat       Pertinent labs & imaging results that were available during my care of the patient were reviewed by me and considered in my medical decision making.  Assessment & Plan:  Secundino was seen today for edema and wrist pain.  Diagnoses and all orders for this visit:  Injury of left lower extremity, initial encounter -    : US Venous Img Lower Unilateral left (DVT); Future -     doxycycline (VIBRA-TABS) 100 MG tablet; Take 1 tablet (100 mg total) by mouth 2 (two) times daily for 10 days. 1 po bid  Injury of left wrist, initial encounter Left wrist pain No acute findings on imaging. Brace applied in office RICE discussed in detail. Report new, worsening, or persistent symptoms.  -     DG Wrist Complete Left  Localized swelling of left lower leg Discoloration of skin of lower leg DVT study negative. Pt aware to take antibiotics as prescribed. Moist heat and arnica to help with bruising and swelling.  -     US Venous Img Lower Unilateral Leftt (DVT); Future -     doxycycline (VIBRA-TABS) 100 MG tablet; Take 1 tablet (100 mg total) by mouth 2 (two) times daily for 10 days. 1 po bid  Laceration of left lower extremity, initial encounter Cellulitis of left leg Tetanus within last 10 years. Wound care discussed in detail. Aware to report new, worsening, or persistent symptoms.  -     doxycycline (VIBRA-TABS) 100 MG tablet; Take 1 tablet (100 mg total) by mouth 2 (two) times daily for 10 days. 1 po bid     Continue all other maintenance medications.  Follow up plan: Return if symptoms worsen or fail to improve.   Continue healthy lifestyle choices, including diet (rich in fruits, vegetables, and lean proteins, and low in salt and simple carbohydrates) and  exercise (at least 30 minutes of moderate physical activity daily).   The above assessment and management plan was discussed with the patient. The patient verbalized understanding of and has agreed to the management plan. Patient is aware to call the clinic if they develop any new symptoms or if symptoms persist or worsen. Patient is aware when to return to the clinic for a follow-up visit. Patient educated on when it is appropriate to go to the emergency department.   Kari Baars, FNP-C [facility] [phone]

## 2023-09-20 ENCOUNTER — Encounter: Payer: Self-pay | Admitting: Family Medicine

## 2023-09-21 ENCOUNTER — Other Ambulatory Visit: Payer: Self-pay | Admitting: Family Medicine

## 2023-09-21 DIAGNOSIS — J321 Chronic frontal sinusitis: Secondary | ICD-10-CM

## 2023-09-21 DIAGNOSIS — J324 Chronic pansinusitis: Secondary | ICD-10-CM

## 2023-09-23 ENCOUNTER — Ambulatory Visit (INDEPENDENT_AMBULATORY_CARE_PROVIDER_SITE_OTHER): Payer: Medicare Other | Admitting: Orthopedic Surgery

## 2023-09-23 DIAGNOSIS — I872 Venous insufficiency (chronic) (peripheral): Secondary | ICD-10-CM

## 2023-09-23 DIAGNOSIS — I890 Lymphedema, not elsewhere classified: Secondary | ICD-10-CM

## 2023-09-23 DIAGNOSIS — L97921 Non-pressure chronic ulcer of unspecified part of left lower leg limited to breakdown of skin: Secondary | ICD-10-CM | POA: Diagnosis not present

## 2023-09-27 NOTE — Progress Notes (Unsigned)
 [facility] V[facility]ENTER   MULTIDISCIPLINARY HEART VALVE CLINIC                                     Cardiology Office Note:    Date:  10/01/2023   ID:  Ryan Garner, DOB [birth year], MRN [id number]  PCP:  [name], [name], MD  [facility] Cardiologist:  [name], MD / Dr. Excell Seltzer (clip) / Clifton James (TAVR) [facility] Electrophysiologist:  None   Referring MD: [name], [name], MD   1 year s/p mTEER.   History of Present Illness:    Ryan Garner is a 78 y.o. 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   He was admitted 2/21-3/21/24 for acute HFpEF requiring dobutamine for RV support and diuresed over 90lbs. Found to have severe functional mitral regurgitation and underwent mitral transcatheter edge-to-edge repair with PASCAL ACE (x2) in on 10/11/22. This was complicated by a rectus sheath hematoma. He did well in follow up but then developed worsening heart failure. Follow up TTE on 11/16/2022 showed EF 50-55%, stable mitral clips and possible LFLG AS. Follow up TEE 12/26/22 showed EF 45%, moderate RV dysfunction, severe AS with mean grad 27 mmHg, peak grad 41.3 mmHg, AVA 0.9 cm2, DVI  0.20, SVI 30, s/p mTEER with moderate residual MR by Dr. Excell Seltzer. [facility] 02/05/23 showed moderate stenosis in the D1 and OM1 with patient LCX stent and no interventional targets as well as mildly elevated PCWP with prominent v-waves consistent with known moderate residual MR post-Mitraclips. S/p TAVR with a 29 mm Edwards Sapien 3 Ultra Resilia THV via the TF approach on 04/23/23. 1 month echo showed EF at 45-50% with D-shaped septum suggestive of RV pressure/overload (relatively unchanged from prior echo imaging) with stable Pascal Mitral clips in place with moderate residual MR with a mean gradient  at and stable 29mm Edwards Sapien TAVR valve with a mean gradient at and AVA at 2.47cm2 with no PVL.   Patient had labs in 08/2023 by Dr. Louanne Skye.  These were personally reviewed and showed creatinine around 1.85, hemoglobin A1c 7, Hemoglobin 14.4, platelets 222.   Today the patient presents to clinic for follow up. Here alone. Dropped a cabinet on his left leg 2 weeks ago and walking with a cane. Dr Lajoyce Corners is following this. There has been a little associated swelling but otherwise no LE edema. No CP or SOB. No LE edema, orthopnea or PND. No dizziness or syncope. No blood in stool or urine. No palpitations.     Past Medical History:  Diagnosis Date   A-fib [facility])    Acute on chronic diastolic CHF (congestive heart failure) (HCC) 07/21/2020   Coronary artery disease    Diabetes mellitus without complication (HCC)    Hyperlipidemia    Hypertension    Metabolic syndrome    Prediabetes    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 Excell Seltzer and Dr. 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 Clifton James & Dr. Laneta Simmers   Sleep apnea  STEMI (ST elevation myocardial infarction) (HCC)      Current Medications: Current Meds  Medication Sig   apixaban (ELIQUIS) 5 MG TABS tablet Take 1 tablet (5 mg total) by mouth 2 (two) times daily.   ascorbic acid (VITAMIN C) 1000 MG tablet Take 1 tablet (1,000 mg total) by mouth daily.   atorvastatin (LIPITOR) 80 MG tablet Take 1 tablet by mouth once daily   dapagliflozin propanediol (FARXIGA) 10 MG TABS tablet Take 1 tablet (10 mg total) by mouth in the morning.   diclofenac Sodium (VOLTAREN) 1 % GEL Apply 2 g topically 4 (four) times daily.   fluticasone (FLONASE) 50 MCG/ACT nasal spray Place 2 sprays into both nostrils daily.   Multiple Vitamin (MULTIVITAMIN WITH MINERALS) TABS tablet  Take 1 tablet by mouth daily.   nitroGLYCERIN (NITROSTAT) 0.4 MG SL tablet Place 1 tablet (0.4 mg total) under the tongue every 5 (five) minutes x 3 doses as needed for chest pain.   spironolactone (ALDACTONE) 25 MG tablet Take 25 mg by mouth daily.   torsemide (DEMADEX) 20 MG tablet Take 3 tablets (60 mg total) by mouth 2 (two) times daily.      ROS:   Please see the history of present illness.    All other systems reviewed and are negative.  EKGs       Risk Assessment/Calculations:    CHA2DS2-VASc Score =     This indicates a  % annual risk of stroke. The patient's score is based upon:            Physical Exam:    VS:  BP 120/60   Pulse 83   Ht 5\' 7"  (1.702 m)   Wt 262 lb 9.6 oz (119.1 kg)   SpO2 99%   BMI 41.13 kg/m     Wt Readings from Last 3 Encounters:  09/30/23 262 lb 9.6 oz (119.1 kg)  09/17/23 260 lb (117.9 kg)  09/11/23 261 lb (118.4 kg)     GEN: Well nourished, well developed in no acute distress NECK: No JVD CARDIAC: irreg irreg, soft flow murmur over RUSB, soft holosystolic murmur at apex. No rubs, gallops RESPIRATORY:  Clear to auscultation without rales, wheezing or rhonchi  ABDOMEN: Soft, non-tender, non-distended EXTREMITIES:  Mild LLE edema. Leg wrapped. No deformity.    ASSESSMENT:    1. S/P mitral valve clip implantation   2. S/P TAVR (transcatheter aortic valve replacement)   3. Heart failure with preserved ejection fraction, unspecified HF chronicity (HCC)   4. Coronary artery disease involving native coronary artery of native heart without angina pectoris   5. Persistent atrial fibrillation (HCC)   6. Stage 3b chronic kidney disease (HCC)   7. Renal lesion   8. Morbid obesity (HCC)     PLAN:    In order of problems listed above:  Severe MR s/p mTEER: -- Echo today shows EF 55-60%, mod RV dysfunction, severe pulm HTN (RSVP 60.5), flattened interventricular septum c/w RV overload, severe TR, s/p Mitraclip x 2 with severe central  MR, s/p TAVR with normal function (mean gradient 8.4 mm hg).  -- Thankfully, he has NYHA class II symptoms with occasional fatigue and SOB. -- He will see Dr. Excell Seltzer back in clinic on 4/8 to discuss clinical surveillance vs further work up with TEE for return of severe MR after mTEER.  Severe AS s/p TAVR:  -- Echo today shows normally functioning TAVR.  -- Will arrange 1 year follow up in 04/2024. -- Continue lifelong SBE  prophylaxis.   HFpEF:  -- Followed by Dr. Shirlee Latch in the advanced CHF clinic. -- Appears euvolemic. -- Continue torsemide 60 mg bid.  -- Continue spironolactone 25 mg daily.  -- Continue Farxiga 10 mg daily. No GU symptoms.  CAD:  -- [facility] 02/05/23 showed moderate stenosis in the D1 and OM1 with patient LCX stent and no interventional targets.  -- Continue medical therapy.  -- Continue atorvastatin 80mg  daily.  -- No aspirin given OAC with Eliquis 5mg  BID.  Persistent atrial fibrillation:  -- Rate well controlled without any AV nodal blocking agents -- Continue Eliquis 5mg  BID.   -- He is currently on the correct dose of Eliquis but is approaching the age of 68 and has a creatinine of over 1.5.  Would anticipate lowering him to the renally dose in July when he turns 78 years old.   CKD stage IIIb:  -- Baseline Cr appears to be in the 1.5-2.0 range.  -- Creat 1.85 on labs 2/12 (personally reviewed)   Renal lesions:  -- Pre-TAVR CT showed "bilateral indeterminate renal lesions." -- Follow up CT showed renal cysts with no follow up required.   Morbid obesity:  -- Body mass index is 41.13 kg/m. -- Counseled on diet and exercise.  -- Could consider GLP-1 agonist with concomitant diabetes.    Medication Adjustments/Labs and Tests Ordered: Current medicines are reviewed at length with the patient today.  Concerns regarding medicines are outlined above.  No orders of the defined types were placed in this encounter.  No orders of the defined types were placed in  this encounter.   Patient Instructions  Medication Instructions:  Your physician recommends that you continue on your current medications as directed. Please refer to the Current Medication list given to you today.  *If you need a refill on your cardiac medications before your next appointment, please call your pharmacy*   Lab Work: None ordered  If you have labs (blood work) drawn today and your tests are completely normal, you will receive your results only by: MyChart Message (if you have MyChart) OR A paper copy in the mail If you have any lab test that is abnormal or we need to change your treatment, we will call you to review the results.   Testing/Procedures: None ordered   Follow-Up: At [facility], you and your health needs are our priority.  As part of our continuing mission to provide you with exceptional heart care, we have created designated Provider Care Teams.  These Care Teams include your primary Cardiologist (physician) and Advanced Practice Providers (APPs -  Physician Assistants and Nurse Practitioners) who all work together to provide you with the care you need, when you need it.  We recommend signing up for the patient portal called "MyChart".  Sign up information is provided on this After Visit Summary.  MyChart is used to connect with patients for Virtual Visits (Telemedicine).  Patients are able to view lab/test results, encounter notes, upcoming appointments, etc.  Non-urgent messages can be sent to your provider as well.   To learn more about what you can do with MyChart, go to [email or web address].    Your next appointment:   As scheduled  Provider:   Carlean Jews, PA-C    Other Instructions       1st Floor: - Lobby - Registration  - Pharmacy  - Lab - Cafe  2nd Floor: - PV Lab - Diagnostic Testing (echo, CT, nuclear med)  3rd Floor: - Vacant  4th Floor: - TCTS (cardiothoracic surgery) - AFib Clinic - Structural Heart  Clinic - Vascular Surgery  - Vascular Ultrasound  5th Floor: - HeartCare Cardiology (general and EP) - Clinical Pharmacy for coumadin, hypertension, lipid, weight-loss medications, and med management appointments    Valet parking services will be available as well.     Signed, Cline Crock, PA-C  10/01/2023 8:36 AM    [facility]

## 2023-09-30 ENCOUNTER — Ambulatory Visit (HOSPITAL_BASED_OUTPATIENT_CLINIC_OR_DEPARTMENT_OTHER): Payer: Medicare Other

## 2023-09-30 ENCOUNTER — Ambulatory Visit: Payer: Medicare Other | Attending: Cardiovascular Disease | Admitting: Physician Assistant

## 2023-09-30 ENCOUNTER — Encounter: Payer: Self-pay | Admitting: Orthopedic Surgery

## 2023-09-30 VITALS — BP 120/60 | HR 83 | Ht 67.0 in | Wt 262.6 lb

## 2023-09-30 DIAGNOSIS — Z95818 Presence of other cardiac implants and grafts: Secondary | ICD-10-CM | POA: Diagnosis not present

## 2023-09-30 DIAGNOSIS — N1832 Chronic kidney disease, stage 3b: Secondary | ICD-10-CM | POA: Diagnosis not present

## 2023-09-30 DIAGNOSIS — N289 Disorder of kidney and ureter, unspecified: Secondary | ICD-10-CM

## 2023-09-30 DIAGNOSIS — I2510 Atherosclerotic heart disease of native coronary artery without angina pectoris: Secondary | ICD-10-CM | POA: Diagnosis not present

## 2023-09-30 DIAGNOSIS — I4819 Other persistent atrial fibrillation: Secondary | ICD-10-CM

## 2023-09-30 DIAGNOSIS — I5030 Unspecified diastolic (congestive) heart failure: Secondary | ICD-10-CM | POA: Diagnosis not present

## 2023-09-30 DIAGNOSIS — Z952 Presence of prosthetic heart valve: Secondary | ICD-10-CM

## 2023-09-30 DIAGNOSIS — Z9889 Other specified postprocedural states: Secondary | ICD-10-CM | POA: Diagnosis not present

## 2023-09-30 LAB — ECHOCARDIOGRAM COMPLETE
AR max vel: 2.75 cm2
AV Area VTI: 2.76 cm2
AV Area mean vel: 2.75 cm2
AV Mean grad: 8.4 mmHg
AV Peak grad: 15.6 mmHg
Ao pk vel: 1.98 m/s
Area-P 1/2: 2.72 cm2
Height: 67 in
MV M vel: 5.27 m/s
MV Peak grad: 111.1 mmHg
MV VTI: 2.6 cm2
Radius: 0.8 cm
S' Lateral: 3.8 cm
Weight: 4201.6 [oz_av]

## 2023-09-30 NOTE — Progress Notes (Signed)
 Office Visit Note   Patient: Ryan Garner           Date of Birth: 09/26/1945           MRN: [id number] Visit Date: 09/23/2023              Requested by: [name], [name], MD [address] [place],  [place] [address] PCP: [name], [name], MD  Chief Complaint  Patient presents with   Left Leg - Pain      HPI: Patient is a 78 year old gentleman who presents with acute trauma left leg.  Patient states he dropped a cabinet on his leg last Friday.  Assessment & Plan: Visit Diagnoses:  1. Venous stasis dermatitis of both lower extremities   2. Lymphedema of both lower extremities   3. Nonhealing ulcer of left lower leg limited to breakdown of skin (HCC)     Plan: Recommended elevation and compression and exercise.  Routine wound care.  Follow-Up Instructions: Return in about 2 weeks (around 10/07/2023).   Ortho Exam  Patient is alert, oriented, no adenopathy, well-dressed, normal affect, normal respiratory effort. Examination patient has a wound over the left tibia that measures 3 x 2 cm there is no drainage.  There is black eschar.  No cellulitis no signs of infection.  Imaging: No results found. No images are attached to the encounter.  Labs: Lab Results  Component Value Date   HGBA1C 7.0 (H) 09/11/2023   HGBA1C 6.4 (H) 10/10/2022   HGBA1C 7.0 (H) 12/15/2021   REPTSTATUS 04/11/2022 FINAL 04/04/2022   GRAMSTAIN NO WBC SEEN NO ORGANISMS SEEN  04/04/2022   CULT  04/04/2022    RARE SERRATIA MARCESCENS NO ANAEROBES ISOLATED Performed at [facility], [address] [place], [place] [address]    [facility] SERRATIA MARCESCENS 04/04/2022     Lab Results  Component Value Date   ALBUMIN 4.0 04/19/2023   ALBUMIN 2.6 (L) 09/20/2022   ALBUMIN 3.1 (L) 09/12/2022    Lab Results  Component Value Date   MG 2.0 04/24/2023   MG 1.9 10/11/2022   MG 2.0 10/10/2022   Lab Results  Component Value Date   VD25OH 36.7 04/14/2020    No results found for:  "PREALBUMIN"    Latest Ref Rng & Units 09/11/2023   11:37 AM 04/24/2023    3:47 AM 04/23/2023   12:57 PM  CBC EXTENDED  WBC 3.4 - 10.8 x10E3/uL 10.3  8.8    RBC 4.14 - 5.80 x10E6/uL 4.68  3.94    Hemoglobin 13.0 - 17.7 g/dL 82.9  56.2  13.0   HCT 37.5 - 51.0 % 41.8  36.0  36.0   Platelets 150 - 450 x10E3/uL 222  225    NEUT# 1.4 - 7.0 x10E3/uL 7.8     Lymph# 0.7 - 3.1 x10E3/uL 1.2        There is no height or weight on file to calculate BMI.  Orders:  No orders of the defined types were placed in this encounter.  No orders of the defined types were placed in this encounter.    Procedures: No procedures performed  Clinical Data: No additional findings.  ROS:  All other systems negative, except as noted in the HPI. Review of Systems  Objective: Vital Signs: There were no vitals taken for this visit.  Specialty Comments:  No specialty comments available.  PMFS History: Patient Active Problem List   Diagnosis Date Noted   Chronic frontal sinusitis 08/07/2023  Degenerative joint disease of hand 08/07/2023   Bilateral hand pain 08/07/2023   Groin hematoma 04/24/2023   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   Acute on chronic systolic (congestive) heart failure (HCC) 09/17/2022   Acute on chronic diastolic heart failure (HCC) 09/12/2022   Pulmonary hypertension (HCC) 07/13/2022   OSA (obstructive sleep apnea) 07/13/2022   Lymphedema 07/13/2022   Severe mitral insufficiency    Nonrheumatic aortic valve stenosis    Bradycardia 04/06/2022   (HFpEF) heart failure with preserved ejection fraction (HCC) 04/06/2022   Abscess of leg, left 04/04/2022   Chronic heart failure with mildly reduced ejection fraction (HFmrEF, 41-49%) (HCC) 03/02/2022   Other secondary pulmonary hypertension (HCC) 08/30/2021   COPD (chronic obstructive pulmonary disease) (HCC) 08/30/2021   Coronary artery disease due to type  2 diabetes mellitus (HCC) 07/21/2020   Atrial fibrillation (HCC) 02/29/2020   AKI (acute kidney injury) (HCC) 02/28/2020   Morbid obesity (HCC) 09/04/2018   Lung nodules 01/17/2017   Ascending aortic aneurysm 01/17/2017   Type 2 diabetes mellitus (HCC) 03/20/2016   Hypertension    Hyperlipidemia    Metabolic syndrome    Osteoarthritis of both knees 07/06/2013   Acquired spondylolisthesis 03/06/2013   Degeneration of lumbar intervertebral disc 03/06/2013   Lumbar spondylosis 03/06/2013   Spinal stenosis of lumbar region 03/06/2013   Past Medical History:  Diagnosis Date   A-fib [facility])    Acute on chronic diastolic CHF (congestive heart failure) (HCC) 07/21/2020   Coronary artery disease    Diabetes mellitus without complication (HCC)    Dyspnea    Dysrhythmia    A-fib   Hyperlipidemia    Hypertension    Metabolic syndrome    Prediabetes    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 Excell Seltzer and Dr. 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 Clifton James & Dr. Laneta Simmers   Sleep apnea    STEMI (ST elevation myocardial infarction) [facility])     Family History  Problem Relation Age of Onset   Cancer Mother        lungs   Coronary artery disease Mother    Diabetes Brother    Heart attack Brother    Diabetes Maternal Grandmother    Arthritis Father     Past Surgical History:  Procedure Laterality Date   APPENDECTOMY     BUBBLE STUDY  10/02/2022   Procedure: BUBBLE STUDY;  Surgeon: Laurey Morale, MD;  Location: [facility] ENDOSCOPY;  Service: Cardiovascular;;   CHOLECYSTECTOMY N/A 03/02/2020   Procedure: LAPAROSCOPIC CHOLECYSTECTOMY;  Surgeon: Franky Macho, MD;  Location: [facility] ORS;  Service: General;  Laterality: N/A;   CORONARY STENT INTERVENTION N/A 04/30/2020   Procedure: CORONARY STENT INTERVENTION;  Surgeon: Swaziland, Peter M,  MD;  Location: [facility] INVASIVE CV LAB;  Service: Cardiovascular;  Laterality: N/A;   CORONARY/GRAFT ACUTE MI REVASCULARIZATION N/A 04/30/2020   Procedure: Coronary/Graft Acute MI Revascularization;  Surgeon: Swaziland, Peter M, MD;  Location: [facility] INVASIVE CV LAB;  Service: Cardiovascular;  Laterality: N/A;   Eyelid Surgery Bilateral    HERNIA REPAIR     I & D EXTREMITY Left 04/06/2022   Procedure: LEFT LEG DEBRIDEMENT;  Surgeon: Nadara Mustard, MD;  Location: [facility] OR;  Service: Orthopedics;  Laterality: Left;   I &  D EXTREMITY Left 04/04/2022   Procedure: LEFT LEG DEBRIDEMENT;  Surgeon: Nadara Mustard, MD;  Location: [facility] OR;  Service: Orthopedics;  Laterality: Left;   INTRAOPERATIVE TRANSTHORACIC ECHOCARDIOGRAM N/A 04/23/2023   Procedure: INTRAOPERATIVE TRANSTHORACIC ECHOCARDIOGRAM;  Surgeon: Kathleene Hazel, MD;  Location: [facility] INVASIVE CV LAB;  Service: Open Heart Surgery;  Laterality: N/A;   knee tendons repair     LEFT HEART CATH AND CORONARY ANGIOGRAPHY N/A 04/30/2020   Procedure: LEFT HEART CATH AND CORONARY ANGIOGRAPHY;  Surgeon: Swaziland, Peter M, MD;  Location: [facility] INVASIVE CV LAB;  Service: Cardiovascular;  Laterality: N/A;   RIGHT HEART CATH N/A 10/03/2022   Procedure: RIGHT HEART CATH;  Surgeon: Laurey Morale, MD;  Location: [facility] INVASIVE CV LAB;  Service: Cardiovascular;  Laterality: N/A;   RIGHT HEART CATH N/A 12/26/2022   Procedure: RIGHT HEART CATH;  Surgeon: Laurey Morale, MD;  Location: [facility] INVASIVE CV LAB;  Service: Cardiovascular;  Laterality: N/A;   RIGHT/LEFT HEART CATH AND CORONARY ANGIOGRAPHY N/A 02/05/2023   Procedure: RIGHT/LEFT HEART CATH AND CORONARY ANGIOGRAPHY;  Surgeon: Laurey Morale, MD;  Location: [facility] INVASIVE CV LAB;  Service: Cardiovascular;  Laterality: N/A;   ROTATOR CUFF REPAIR Bilateral    TEE WITHOUT CARDIOVERSION N/A 10/02/2022   Procedure: TRANSESOPHAGEAL ECHOCARDIOGRAM (TEE);  Surgeon: Laurey Morale, MD;  Location: [facility] ENDOSCOPY;  Service: Cardiovascular;   Laterality: N/A;   TEE WITHOUT CARDIOVERSION N/A 10/10/2022   Procedure: TRANSESOPHAGEAL ECHOCARDIOGRAM;  Surgeon: Tonny Bollman, MD;  Location: [facility] INVASIVE CV LAB;  Service: Cardiovascular;  Laterality: N/A;   TEE WITHOUT CARDIOVERSION N/A 12/26/2022   Procedure: TRANSESOPHAGEAL ECHOCARDIOGRAM;  Surgeon: Laurey Morale, MD;  Location: [facility] INVASIVE CV LAB;  Service: Cardiovascular;  Laterality: N/A;   TONSILLECTOMY     TRANSCATHETER AORTIC VALVE REPLACEMENT, TRANSFEMORAL N/A 04/23/2023   Procedure: Transcatheter Aortic Valve Replacement, Transfemoral;  Surgeon: Kathleene Hazel, MD;  Location: [facility] INVASIVE CV LAB;  Service: Open Heart Surgery;  Laterality: N/A;   TRANSCATHETER MITRAL EDGE TO EDGE REPAIR N/A 10/10/2022   Procedure: MITRAL VALVE REPAIR;  Surgeon: Tonny Bollman, MD;  Location: [facility] INVASIVE CV LAB;  Service: Cardiovascular;  Laterality: N/A;   Social History   Occupational History   Occupation: Education officer, environmental    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

## 2023-09-30 NOTE — Patient Instructions (Signed)
 Medication Instructions:  Your physician recommends that you continue on your current medications as directed. Please refer to the Current Medication list given to you today.  *If you need a refill on your cardiac medications before your next appointment, please call your pharmacy*   Lab Work: None ordered  If you have labs (blood work) drawn today and your tests are completely normal, you will receive your results only by: MyChart Message (if you have MyChart) OR A paper copy in the mail If you have any lab test that is abnormal or we need to change your treatment, we will call you to review the results.   Testing/Procedures: None ordered   Follow-Up: At [facility], you and your health needs are our priority.  As part of our continuing mission to provide you with exceptional heart care, we have created designated Provider Care Teams.  These Care Teams include your primary Cardiologist (physician) and Advanced Practice Providers (APPs -  Physician Assistants and Nurse Practitioners) who all work together to provide you with the care you need, when you need it.  We recommend signing up for the patient portal called "MyChart".  Sign up information is provided on this After Visit Summary.  MyChart is used to connect with patients for Virtual Visits (Telemedicine).  Patients are able to view lab/test results, encounter notes, upcoming appointments, etc.  Non-urgent messages can be sent to your provider as well.   To learn more about what you can do with MyChart, go to [email or web address].    Your next appointment:   As scheduled  Provider:   Carlean Jews, PA-C    Other Instructions       1st Floor: - Lobby - Registration  - Pharmacy  - Lab - Cafe  2nd Floor: - PV Lab - Diagnostic Testing (echo, CT, nuclear med)  3rd Floor: - Vacant  4th Floor: - TCTS (cardiothoracic surgery) - AFib Clinic - Structural Heart Clinic - Vascular Surgery  - Vascular  Ultrasound  5th Floor: - HeartCare Cardiology (general and EP) - Clinical Pharmacy for coumadin, hypertension, lipid, weight-loss medications, and med management appointments    Valet parking services will be available as well.

## 2023-10-07 ENCOUNTER — Ambulatory Visit (INDEPENDENT_AMBULATORY_CARE_PROVIDER_SITE_OTHER): Admitting: Orthopedic Surgery

## 2023-10-07 DIAGNOSIS — I83892 Varicose veins of left lower extremities with other complications: Secondary | ICD-10-CM | POA: Diagnosis not present

## 2023-10-07 DIAGNOSIS — I872 Venous insufficiency (chronic) (peripheral): Secondary | ICD-10-CM | POA: Diagnosis not present

## 2023-10-07 DIAGNOSIS — I890 Lymphedema, not elsewhere classified: Secondary | ICD-10-CM

## 2023-10-07 DIAGNOSIS — I83029 Varicose veins of left lower extremity with ulcer of unspecified site: Secondary | ICD-10-CM

## 2023-10-07 DIAGNOSIS — L97921 Non-pressure chronic ulcer of unspecified part of left lower leg limited to breakdown of skin: Secondary | ICD-10-CM

## 2023-10-07 DIAGNOSIS — L97929 Non-pressure chronic ulcer of unspecified part of left lower leg with unspecified severity: Secondary | ICD-10-CM | POA: Diagnosis not present

## 2023-10-07 DIAGNOSIS — R600 Localized edema: Secondary | ICD-10-CM | POA: Diagnosis not present

## 2023-10-08 ENCOUNTER — Encounter: Payer: Self-pay | Admitting: Orthopedic Surgery

## 2023-10-08 ENCOUNTER — Encounter: Payer: Self-pay | Admitting: Family Medicine

## 2023-10-08 NOTE — Progress Notes (Signed)
 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  Patient is currently on Eliquis.  Assessment & Plan: Visit Diagnoses:  1. Venous stasis dermatitis of both lower extremities   2. Lymphedema of both lower extremities   3. Nonhealing ulcer of left lower leg limited to breakdown of skin (HCC)   4. Venous stasis ulcer of left lower leg with edema of left lower leg (HCC)     Plan: Will apply silver cell to the wound and a Dynaflex compression wrap.  Anticipate advancing to a medical compression sock.  Patient has a TAVR and will be unable to come off his anticoagulation therapy.  Discussed that this will be challenging for wound healing.  Follow-Up Instructions: Return in about 1 week (around 10/14/2023).   Ortho Exam  Patient is alert, oriented, no adenopathy, well-dressed, normal affect, normal respiratory effort. Examination the wound bed is filled with a hematoma.  This was debrided back to healthy viable granulation tissue.  There is excess bleeding from the granulation tissue secondary to the Eliquis.  The wound measures 3 cm in diameter and 1 cm deep after debridement.  Imaging: No results found. No images are attached to the encounter.  Labs: Lab Results  Component Value Date   HGBA1C 7.0 (H) 09/11/2023   HGBA1C 6.4 (H) 10/10/2022   HGBA1C 7.0 (H) 12/15/2021   REPTSTATUS 04/11/2022 FINAL 04/04/2022   GRAMSTAIN NO WBC SEEN NO ORGANISMS SEEN  04/04/2022   CULT  04/04/2022    RARE SERRATIA MARCESCENS NO ANAEROBES ISOLATED Performed at [facility], [address] [place], [place] [address]    [facility] SERRATIA MARCESCENS 04/04/2022     Lab Results  Component Value Date   ALBUMIN 4.0 04/19/2023   ALBUMIN 2.6 (L) 09/20/2022   ALBUMIN 3.1 (L) 09/12/2022    Lab Results  Component Value Date   MG 2.0 04/24/2023   MG 1.9 10/11/2022   MG 2.0 10/10/2022   Lab Results  Component Value Date   VD25OH 36.7 04/14/2020    No results found for: "PREALBUMIN"    Latest Ref Rng & Units 09/11/2023   11:37 AM 04/24/2023    3:47 AM 04/23/2023   12:57 PM  CBC EXTENDED  WBC 3.4 - 10.8 x10E3/uL 10.3  8.8    RBC 4.14 - 5.80 x10E6/uL 4.68  3.94    Hemoglobin 13.0 - 17.7 g/dL 82.9  56.2  13.0   HCT 37.5 - 51.0 % 41.8  36.0  36.0   Platelets 150 - 450 x10E3/uL 222  225    NEUT# 1.4 - 7.0 x10E3/uL 7.8     Lymph# 0.7 - 3.1 x10E3/uL 1.2        There is no height or weight on file to calculate BMI.  Orders:  No orders of the defined types were placed in this encounter.  No orders of the defined types were placed in this  encounter.    Procedures: No procedures performed  Clinical Data: No additional findings.  ROS:  All other systems negative, except as noted in the HPI. Review of Systems  Objective: Vital Signs: There were no vitals taken for this visit.  Specialty Comments:  No specialty comments available.  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   Acute on chronic systolic (congestive) heart failure (HCC) 09/17/2022   Acute on chronic diastolic heart failure (HCC) 09/12/2022   Pulmonary hypertension (HCC) 07/13/2022   OSA (obstructive sleep apnea) 07/13/2022   Lymphedema 07/13/2022   Severe mitral insufficiency    Nonrheumatic aortic valve stenosis     Bradycardia 04/06/2022   (HFpEF) heart failure with preserved ejection fraction (HCC) 04/06/2022   Abscess of leg, left 04/04/2022   Chronic heart failure with mildly reduced ejection fraction (HFmrEF, 41-49%) (HCC) 03/02/2022   Other secondary pulmonary hypertension (HCC) 08/30/2021   COPD (chronic obstructive pulmonary disease) (HCC) 08/30/2021   Coronary artery disease due to type 2 diabetes mellitus (HCC) 07/21/2020   Atrial fibrillation (HCC) 02/29/2020   AKI (acute kidney injury) (HCC) 02/28/2020   Morbid obesity (HCC) 09/04/2018   Lung nodules 01/17/2017   Ascending aortic aneurysm 01/17/2017   Type 2 diabetes mellitus (HCC) 03/20/2016   Hypertension    Hyperlipidemia    Metabolic syndrome    Osteoarthritis of both knees 07/06/2013   Acquired spondylolisthesis 03/06/2013   Degeneration of lumbar intervertebral disc 03/06/2013   Lumbar spondylosis 03/06/2013   Spinal stenosis of lumbar region 03/06/2013   Past Medical History:  Diagnosis Date   A-fib [facility])    Acute on chronic diastolic CHF (congestive heart failure) (HCC) 07/21/2020   Coronary artery disease    Diabetes mellitus without complication (HCC)    Hyperlipidemia    Hypertension    Metabolic syndrome    Prediabetes    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 Excell Seltzer and Dr. 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 Clifton James & Dr. Laneta Simmers   Sleep apnea    STEMI (ST elevation myocardial infarction) [facility])     Family History  Problem Relation Age of Onset   Cancer Mother        lungs   Coronary artery disease Mother    Diabetes Brother    Heart attack Brother    Diabetes Maternal Grandmother    Arthritis Father     Past Surgical History:  Procedure Laterality Date   APPENDECTOMY     BUBBLE STUDY  10/02/2022   Procedure: BUBBLE STUDY;   Surgeon: Laurey Morale, MD;  Location: [facility] ENDOSCOPY;  Service: Cardiovascular;;   CHOLECYSTECTOMY N/A 03/02/2020   Procedure: LAPAROSCOPIC CHOLECYSTECTOMY;  Surgeon: Franky Macho, MD;  Location: [facility] ORS;  Service: General;  Laterality: N/A;   CORONARY STENT INTERVENTION N/A 04/30/2020   Procedure: CORONARY STENT INTERVENTION;  Surgeon: Swaziland, Peter M, MD;  Location: [facility] INVASIVE CV LAB;  Service: Cardiovascular;  Laterality: N/A;   CORONARY/GRAFT ACUTE MI REVASCULARIZATION N/A 04/30/2020   Procedure: Coronary/Graft Acute MI Revascularization;  Surgeon: Swaziland, Peter M,  MD;  Location: [facility] INVASIVE CV LAB;  Service: Cardiovascular;  Laterality: N/A;   Eyelid Surgery Bilateral    HERNIA REPAIR     I & D EXTREMITY Left 04/06/2022   Procedure: LEFT LEG DEBRIDEMENT;  Surgeon: Nadara Mustard, MD;  Location: [facility] OR;  Service: Orthopedics;  Laterality: Left;   I & D EXTREMITY Left 04/04/2022   Procedure: LEFT LEG DEBRIDEMENT;  Surgeon: Nadara Mustard, MD;  Location: [facility] OR;  Service: Orthopedics;  Laterality: Left;   INTRAOPERATIVE TRANSTHORACIC ECHOCARDIOGRAM N/A 04/23/2023   Procedure: INTRAOPERATIVE TRANSTHORACIC ECHOCARDIOGRAM;  Surgeon: Kathleene Hazel, MD;  Location: [facility] INVASIVE CV LAB;  Service: Open Heart Surgery;  Laterality: N/A;   knee tendons repair     LEFT HEART CATH AND CORONARY ANGIOGRAPHY N/A 04/30/2020   Procedure: LEFT HEART CATH AND CORONARY ANGIOGRAPHY;  Surgeon: Swaziland, Peter M, MD;  Location: [facility] INVASIVE CV LAB;  Service: Cardiovascular;  Laterality: N/A;   RIGHT HEART CATH N/A 10/03/2022   Procedure: RIGHT HEART CATH;  Surgeon: Laurey Morale, MD;  Location: [facility] INVASIVE CV LAB;  Service: Cardiovascular;  Laterality: N/A;   RIGHT HEART CATH N/A 12/26/2022   Procedure: RIGHT HEART CATH;  Surgeon: Laurey Morale, MD;  Location: [facility] INVASIVE CV LAB;  Service: Cardiovascular;  Laterality: N/A;   RIGHT/LEFT HEART CATH AND CORONARY ANGIOGRAPHY N/A 02/05/2023   Procedure: RIGHT/LEFT  HEART CATH AND CORONARY ANGIOGRAPHY;  Surgeon: Laurey Morale, MD;  Location: [facility] INVASIVE CV LAB;  Service: Cardiovascular;  Laterality: N/A;   ROTATOR CUFF REPAIR Bilateral    TEE WITHOUT CARDIOVERSION N/A 10/02/2022   Procedure: TRANSESOPHAGEAL ECHOCARDIOGRAM (TEE);  Surgeon: Laurey Morale, MD;  Location: [facility] ENDOSCOPY;  Service: Cardiovascular;  Laterality: N/A;   TEE WITHOUT CARDIOVERSION N/A 10/10/2022   Procedure: TRANSESOPHAGEAL ECHOCARDIOGRAM;  Surgeon: Tonny Bollman, MD;  Location: [facility] INVASIVE CV LAB;  Service: Cardiovascular;  Laterality: N/A;   TEE WITHOUT CARDIOVERSION N/A 12/26/2022   Procedure: TRANSESOPHAGEAL ECHOCARDIOGRAM;  Surgeon: Laurey Morale, MD;  Location: [facility] INVASIVE CV LAB;  Service: Cardiovascular;  Laterality: N/A;   TONSILLECTOMY     TRANSCATHETER AORTIC VALVE REPLACEMENT, TRANSFEMORAL N/A 04/23/2023   Procedure: Transcatheter Aortic Valve Replacement, Transfemoral;  Surgeon: Kathleene Hazel, MD;  Location: [facility] INVASIVE CV LAB;  Service: Open Heart Surgery;  Laterality: N/A;   TRANSCATHETER MITRAL EDGE TO EDGE REPAIR N/A 10/10/2022   Procedure: MITRAL VALVE REPAIR;  Surgeon: Tonny Bollman, MD;  Location: [facility] INVASIVE CV LAB;  Service: Cardiovascular;  Laterality: N/A;   Social History   Occupational History   Occupation: Education officer, environmental    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

## 2023-10-09 ENCOUNTER — Ambulatory Visit: Payer: Self-pay | Admitting: *Deleted

## 2023-10-09 NOTE — Patient Instructions (Addendum)
 Visit Information  Thank you for taking time to visit with me today. Please don't hesitate to contact me if I can be of assistance to you.   Danise Edge [phone] will be the new care manager for [facility]  Following are the goals we discussed today:   Goals Addressed             This Visit's Progress    Patient will have noted improvements in left vascular wound healing. Patient will have decrease episodes of sinusitis & be able to manage his congestive heart failure & other cardiac illnesses at home- care coordination RN CM   On track    10/09/23 update: resolved dx diabetes per patient request, he continues home management of CHF, chronic sinusitis, wound care. Wound not healed. He reports repeated sinusitis. Weight maintained   Interventions Today    Flowsheet Row Most Recent Value  Chronic Disease   Chronic disease during today's visit Diabetes, Chronic Obstructive Pulmonary Disease (COPD), Congestive Heart Failure (CHF), Other  [Denies diabetes, resolved, states had increase cbg/HgA1c related to prednisone use, chronic sinusitis, left venous ulcer wound]  General Interventions   General Interventions Discussed/Reviewed General Interventions Reviewed, Labs, Doctor Visits, Communication with  Labs --  [HgA1c, cbg]  Doctor Visits Discussed/Reviewed Doctor Visits Reviewed, PCP, Specialist  [reviewd visit to wound clinic]  PCP/Specialist Visits Compliance with follow-up visit  Communication with PCP/Specialists, RN  [outreached to clinical pool/pcp related to possible referrals to ENT or infectious disease for treatment plan for recurrent chronic sinusitis after no resolution after 4-5 antibiotics in 2025]  Exercise Interventions   Exercise Discussed/Reviewed Exercise Reviewed, Physical Activity, Weight Managment  [encouraged activity & weight management]  Physical Activity Discussed/Reviewed Physical Activity Reviewed  Weight Management Weight maintenance  Education Interventions    Education Provided Provided Web-based Education, Provided Education  Provided Verbal Education On Nutrition, Foot Care, Labs, Medication, [employer], Other  [high protein diet , infections/infectious disease, pulmonary MD, antibiotic resistance, warm transfer]  Labs Reviewed Hgb A1c  Mental Health Interventions   Mental Health Discussed/Reviewed Mental Health Reviewed, Coping Strategies  Nutrition Interventions   Nutrition Discussed/Reviewed Nutrition Reviewed, Fluid intake, Increasing proteins, Supplemental nutrition  [discussed increase proteins for healing - premier protein drinks]  Pharmacy Interventions   Pharmacy Dicussed/Reviewed Pharmacy Topics Reviewed, Medications and their functions, Affording Medications  Safety Interventions   Safety Discussed/Reviewed Safety Reviewed, Home Safety  Home Safety Assistive Devices              Our next appointment is by telephone on 11/12/23 at 1000 by Doreatha Lew  Please call the care guide team at [phone] if you need to cancel or reschedule your appointment.   If you are experiencing a Mental Health or Behavioral Health Crisis or need someone to talk to, please call the Suicide and Crisis Lifeline: 988 call the [place] National Suicide Prevention Lifeline: 772-183-0362 or TTY: 780-758-4310 TTY 914-791-2185) to talk to a trained counselor call 1-800-273-TALK (toll free, 24 hour hotline) call the [facility]: 404 042 7310 call 911   Patient verbalizes understanding of instructions and care plan provided today and agrees to view in MyChart. Active MyChart status and patient understanding of how to access instructions and care plan via MyChart confirmed with patient.     The patient has been provided with contact information for the care management team and has been advised to call with any health related questions or concerns.   Maisha Bogen L. Noelle Penner, RN, BSN, CCM [employer]  [facility],  [facility] RN Care Manager Direct Dial: [phone]  Fax: [phone]

## 2023-10-09 NOTE — Patient Outreach (Addendum)
 Care Coordination   Follow Up Visit Note   10/09/2023 Name: Ryan Garner MRN: [id number] DOB: 02/12/1946  Ryan Garner is a 78 y.o. year old male who sees [name], [name], MD for primary care. I spoke with  [name] by phone today.  What matters to the patients health and wellness today?  Chronic sinusitis continues  He has used his Navage daily but not listed flonase on medicine profile Some yellow secretions reported Voiced understanding of infectious disease providers right before a disconnection in the phone line  After a return call to patient he agrees for RN CM to inquire about infectious disease or ENT (ear, nose, throat specialist) He reports he had 4-5 antibiotics this year without resolution  Venous ulcer dressing is being completed at his provider's office Pt states he is not diabetic but had been on prednisone previously  He request removal of diabetes from problem list He voiced understanding of high protein foods to assist with wound healing & to be sent a list high protein foods. He voiced appreciation for the discussion of the protein drink, premier.  He voiced an understanding of the warm transfer to Danise Edge, RN CM, provided number [phone] for future outreaches   Goals Addressed             This Visit's Progress    Patient will have noted improvements in left vascular wound healing. Patient will have decrease episodes of sinusitis & be able to manage his congestive heart failure & other cardiac illnesses at home- care coordination RN CM   On track    10/09/23 update: resolved dx diabetes per patient request, he continues home management of CHF, chronic sinusitis, wound care. Wound not healed. He reports repeated sinusitis. Weight maintained   Interventions Today    Flowsheet Row Most Recent Value  Chronic Disease   Chronic disease during today's visit Diabetes, Chronic Obstructive Pulmonary Disease (COPD), Congestive Heart Failure (CHF),  Other  [Denies diabetes, resolved, states had increase cbg/HgA1c related to prednisone use, chronic sinusitis, left venous ulcer wound]  General Interventions   General Interventions Discussed/Reviewed General Interventions Reviewed, Labs, Doctor Visits, Communication with  Labs --  [HgA1c, cbg]  Doctor Visits Discussed/Reviewed Doctor Visits Reviewed, PCP, Specialist  [reviewd visit to wound clinic]  PCP/Specialist Visits Compliance with follow-up visit  Communication with PCP/Specialists, RN  [outreached to clinical pool/pcp related to possible referrals to ENT or infectious disease for treatment plan for recurrent chronic sinusitis after no resolution after 4-5 antibiotics in 2025]  Exercise Interventions   Exercise Discussed/Reviewed Exercise Reviewed, Physical Activity, Weight Managment  [encouraged activity & weight management]  Physical Activity Discussed/Reviewed Physical Activity Reviewed  Weight Management Weight maintenance  Education Interventions   Education Provided Provided Web-based Education, Provided Education  Provided Verbal Education On Nutrition, Foot Care, Labs, Medication, [employer], Other  [high protein diet , infections/infectious disease, pulmonary MD, antibiotic resistance, warm transfer]  Labs Reviewed Hgb A1c  Mental Health Interventions   Mental Health Discussed/Reviewed Mental Health Reviewed, Coping Strategies  Nutrition Interventions   Nutrition Discussed/Reviewed Nutrition Reviewed, Fluid intake, Increasing proteins, Supplemental nutrition  [discussed increase proteins for healing - premier protein drinks]  Pharmacy Interventions   Pharmacy Dicussed/Reviewed Pharmacy Topics Reviewed, Medications and their functions, Affording Medications  Safety Interventions   Safety Discussed/Reviewed Safety Reviewed, Home Safety  Home Safety Assistive Devices              SDOH assessments and interventions completed:  No     Care Coordination  Interventions:  Yes, provided   Follow up plan: Follow up call scheduled for 11/12/23    Encounter Outcome:  Patient Visit Completed    Cala Bradford L. Noelle Penner, RN, BSN, CCM [place]  [facility], [facility] RN Care Manager Direct Dial: [phone]  Fax: [phone]

## 2023-10-10 ENCOUNTER — Other Ambulatory Visit: Payer: Self-pay | Admitting: Family Medicine

## 2023-10-10 DIAGNOSIS — J329 Chronic sinusitis, unspecified: Secondary | ICD-10-CM

## 2023-10-15 ENCOUNTER — Other Ambulatory Visit: Payer: Self-pay | Admitting: Family

## 2023-10-15 ENCOUNTER — Ambulatory Visit: Admitting: Family

## 2023-10-15 DIAGNOSIS — I872 Venous insufficiency (chronic) (peripheral): Secondary | ICD-10-CM

## 2023-10-15 DIAGNOSIS — L97921 Non-pressure chronic ulcer of unspecified part of left lower leg limited to breakdown of skin: Secondary | ICD-10-CM

## 2023-10-15 DIAGNOSIS — I890 Lymphedema, not elsewhere classified: Secondary | ICD-10-CM

## 2023-10-16 ENCOUNTER — Other Ambulatory Visit: Payer: Self-pay | Admitting: Family

## 2023-10-16 MED ORDER — ASCORBIC ACID 1000 MG PO TABS: 1000.0000 mg | ORAL_TABLET | Freq: Every day | ORAL | 0 refills | Status: DC

## 2023-10-18 ENCOUNTER — Encounter: Payer: Self-pay | Admitting: Family
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
## 2023-10-22 ENCOUNTER — Ambulatory Visit: Admitting: Orthopedic Surgery

## 2023-10-22 DIAGNOSIS — I872 Venous insufficiency (chronic) (peripheral): Secondary | ICD-10-CM

## 2023-10-22 DIAGNOSIS — I890 Lymphedema, not elsewhere classified: Secondary | ICD-10-CM | POA: Diagnosis not present

## 2023-10-28 DIAGNOSIS — N184 Chronic kidney disease, stage 4 (severe): Secondary | ICD-10-CM | POA: Diagnosis not present

## 2023-10-29 ENCOUNTER — Ambulatory Visit: Admitting: Orthopedic Surgery

## 2023-10-29 ENCOUNTER — Ambulatory Visit: Admitting: Cardiovascular Disease

## 2023-10-29 DIAGNOSIS — I872 Venous insufficiency (chronic) (peripheral): Secondary | ICD-10-CM

## 2023-10-29 DIAGNOSIS — I890 Lymphedema, not elsewhere classified: Secondary | ICD-10-CM

## 2023-10-30 ENCOUNTER — Encounter: Payer: Self-pay | Admitting: Orthopedic Surgery

## 2023-10-30 DIAGNOSIS — D631 Anemia in chronic kidney disease: Secondary | ICD-10-CM | POA: Diagnosis not present

## 2023-10-30 DIAGNOSIS — N189 Chronic kidney disease, unspecified: Secondary | ICD-10-CM | POA: Diagnosis not present

## 2023-10-30 DIAGNOSIS — I5032 Chronic diastolic (congestive) heart failure: Secondary | ICD-10-CM | POA: Diagnosis not present

## 2023-10-30 DIAGNOSIS — I129 Hypertensive chronic kidney disease with stage 1 through stage 4 chronic kidney disease, or unspecified chronic kidney disease: Secondary | ICD-10-CM | POA: Diagnosis not present

## 2023-10-30 DIAGNOSIS — N2581 Secondary hyperparathyroidism of renal origin: Secondary | ICD-10-CM | POA: Diagnosis not present

## 2023-10-30 DIAGNOSIS — I350 Nonrheumatic aortic (valve) stenosis: Secondary | ICD-10-CM | POA: Diagnosis not present

## 2023-10-30 DIAGNOSIS — E559 Vitamin D deficiency, unspecified: Secondary | ICD-10-CM | POA: Diagnosis not present

## 2023-10-30 DIAGNOSIS — N184 Chronic kidney disease, stage 4 (severe): Secondary | ICD-10-CM | POA: Diagnosis not present
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
## 2023-10-31 ENCOUNTER — Encounter: Payer: Self-pay | Admitting: Orthopedic Surgery

## 2023-10-31 ENCOUNTER — Encounter: Payer: Self-pay | Admitting: Cardiovascular Disease

## 2023-10-31 ENCOUNTER — Ambulatory Visit: Attending: Cardiovascular Disease | Admitting: Cardiovascular Disease

## 2023-10-31 VITALS — BP 124/78 | HR 62 | Resp 16 | Ht 67.0 in | Wt 267.8 lb

## 2023-10-31 DIAGNOSIS — I340 Nonrheumatic mitral (valve) insufficiency: Secondary | ICD-10-CM | POA: Insufficient documentation

## 2023-10-31 DIAGNOSIS — I4819 Other persistent atrial fibrillation: Secondary | ICD-10-CM | POA: Diagnosis not present
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
## 2023-10-31 NOTE — Progress Notes (Signed)
 Cardiology Office Note:    Date:  10/31/2023   ID:  Ryan Garner, DOB June 23, 1946, MRN [id number]  PCP:  Garner, Ryan Radon, MD   [facility] Providers Cardiologist:  [name], MD     Referring MD: Garner, Ryan Radon, MD   Chief Complaint  Patient presents with   Severe MR    History of Present Illness:    Ryan Garner is a 78 y.o. male presenting for evaluation of mitral regurgitation.  Patient has a complex medical history with history of biventricular heart failure requiring prolonged hospitalization for treatment of massive volume overload.  He ultimately underwent transcatheter edge-to-edge repair of the mitral valve with 2 Pascal ACE devices in March 2024.  He later underwent TAVR via transfemoral approach with a 29 mm Edwards SAPIEN 3 ultra Resilia valve in October 2024.  He initially had mild to moderate residual mitral regurgitation but his recent echo in March 2025 demonstrated severe residual mitral regurgitation.  He has normal function of his aortic prosthesis.  The patient is here alone today.  He has been out at [employer] walking for exercise and states that he has done pretty well with this.  He can walk on level ground with no dyspnea or chest discomfort.  He is short of breath with walking up a hill.  The patient has chronic leg edema but states that his legs are much better than they were in the past when he had marked volume overload.  He denies orthopnea or PND.  Current Medications: Current Meds  Medication Sig   apixaban (ELIQUIS) 5 MG TABS tablet Take 1 tablet (5 mg total) by mouth 2 (two) times daily.   ascorbic acid (NATURAL C/ROSE HIPS) 1000 MG tablet Take 1 tablet (1,000 mg total) by mouth daily.   atorvastatin (LIPITOR) 80 MG tablet Take 1 tablet by mouth once daily   dapagliflozin propanediol (FARXIGA) 10 MG TABS tablet Take 1 tablet (10 mg total) by mouth in the morning.   fluticasone (FLONASE) 50 MCG/ACT nasal spray Place 2 sprays into both  nostrils daily.   Multiple Vitamin (MULTIVITAMIN WITH MINERALS) TABS tablet Take 1 tablet by mouth daily.   nitroGLYCERIN (NITROSTAT) 0.4 MG SL tablet Place 1 tablet (0.4 mg total) under the tongue every 5 (five) minutes x 3 doses as needed for chest pain.   spironolactone (ALDACTONE) 25 MG tablet Take 25 mg by mouth daily.   torsemide (DEMADEX) 20 MG tablet Take 3 tablets (60 mg total) by mouth 2 (two) times daily.     Allergies:   Flecainide, Metoprolol tartrate, and Rofecoxib   ROS:   Please see the history of present illness.    Positive for left foot injury.  All other systems reviewed and are negative.  EKGs/Labs/Other Studies Reviewed:    The following studies were reviewed today: Cardiac Studies & Procedures   ______________________________________________________________________________________________ CARDIAC CATHETERIZATION  CARDIAC CATHETERIZATION 02/05/2023  Narrative   Mid RCA lesion is 30% stenosed.   Ramus lesion is 40% stenosed.   1st Diag lesion is 60% stenosed.   1st Mrg lesion is 70% stenosed.   Non-stenotic Mid Cx lesion was previously treated.  1. Normal RA pressure. 2. Mixed pulmonary venous/pulmonary arterial hypertension. Suspect patient has had pulmonary vascular remodeling with long-standing MR leading to a component of pulmonary arterial hypertension. 3. Mildly elevated PCWP with prominent v-waves consistent with known moderate residual MR post-Mitraclips. 4. Severe aortic stenosis by imaging with mean gradient 30 mmHg on this catheterization.  The aortic valve was surprisingly easy to cross. 5. Moderate stenosis in the D1 and OM1 with patient LCX stent.  No interventional target.  Continue TAVR evaluation.  Findings Coronary Findings Diagnostic  Dominance: Right  Left Main Vessel is normal in caliber. Vessel is angiographically normal.  Left Anterior Descending Vessel is large. There is mild diffuse disease throughout the vessel.  First  Diagonal Branch 1st Diag lesion is 60% stenosed.  Ramus Intermedius Ramus lesion is 40% stenosed.  Left Circumflex Non-stenotic Mid Cx lesion was previously treated.  First Obtuse Marginal Branch 1st Mrg lesion is 70% stenosed.  Right Coronary Artery Vessel is normal in caliber. There is mild diffuse disease throughout the vessel. Mid RCA lesion is 30% stenosed.  Intervention  No interventions have been documented.   CARDIAC CATHETERIZATION  CARDIAC CATHETERIZATION 12/26/2022  Narrative 1. Mildly elevated PCWP and RA pressure. 2. Severe mixed pulmonary hypertension, suspect pulmonary arterial hypertension in setting of long-standing severe MR with vascular remodeling as well as pulmonary venous hypertension and high output PH.  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     ECHOCARDIOGRAM  ECHOCARDIOGRAM COMPLETE 09/30/2023  Narrative ECHOCARDIOGRAM REPORT    Patient Name:   Ryan Garner Date of Exam: 09/30/2023 Medical Rec #:  [id number]      Height:       67.0 in Accession #:    [id number]     Weight:       262.6 lb Date of Birth:  Jun 11, 1946       BSA:          2.270 m Patient Age:    77 years       BP:           120/60 mmHg Patient Gender: M              HR:           88 bpm. Exam Location:  [address]  Procedure: 2D Echo, Color Doppler, Cardiac Doppler and 3D Echo (Both Spectral and Color Flow Doppler were utilized during procedure).  Indications:    s/p TAVR Z95.2  History:        Patient has prior history of Echocardiogram examinations, most recent 05/20/2023. CAD and Previous Myocardial Infarction, Arrythmias:Atrial Fibrillation; Risk Factors:Hypertension, Diabetes and Dyslipidemia. Aortic Valve: 29 mm Edwards Sapien prosthetic, stented (TAVR) valve is present in the aortic position. Procedure Date: 04/23/2023. Mitral Valve: Mitra-Clip valve is present in the mitral position. Procedure Date:  10/10/2022.  Sonographer:    [name] RDCS Referring Phys: [phone] [name]  IMPRESSIONS   1. There is a Mitra-Clip x2 (pascal ACE clips) A2/P2 present in the mitral position. Procedure Date: 10/10/2022. There is now severe residual central MR. 2. The aortic valve has been repaired/replaced. Aortic valve regurgitation is not visualized. No aortic stenosis is present. There is a 29 mm Edwards Sapien prosthetic (TAVR) valve present in the aortic position. Procedure Date: 04/23/2023. Aortic valve area, by VTI measures 2.76 cm. Aortic valve Vmax measures 1.98 m/s. 3. Right ventricular systolic function is moderately reduced. The right ventricular size is normal. There is severely elevated pulmonary artery systolic pressure. The estimated right ventricular systolic pressure is 60.5 mmHg. 4. Left ventricular ejection fraction, by estimation, is 55 to 60%. The left ventricle has normal function. The left ventricle has no regional wall motion abnormalities. There is mild concentric left ventricular hypertrophy. Left ventricular diastolic parameters are indeterminate.  There is the interventricular septum is flattened in systole, consistent with right ventricular pressure overload. 5. The inferior vena cava is normal in size with greater than 50% respiratory variability, suggesting right atrial pressure of 3 mmHg. 6. Left atrial size was severely dilated. 7. Aortic dilatation noted. There is mild dilatation of the ascending aorta, measuring 43 mm. 8. Tricuspid valve regurgitation is severe. 9. Right atrial size was severely dilated.  FINDINGS Left Ventricle: Left ventricular ejection fraction, by estimation, is 55 to 60%. The left ventricle has normal function. The left ventricle has no regional wall motion abnormalities. The left ventricular internal cavity size was normal in size. There is mild concentric left ventricular hypertrophy. The interventricular septum is flattened in systole,  consistent with right ventricular pressure overload. Left ventricular diastolic parameters are indeterminate.  Right Ventricle: The right ventricular size is normal. No increase in right ventricular wall thickness. Right ventricular systolic function is moderately reduced. There is severely elevated pulmonary artery systolic pressure. The tricuspid regurgitant velocity is 3.79 m/s, and with an assumed right atrial pressure of 3 mmHg, the estimated right ventricular systolic pressure is 60.5 mmHg.  Left Atrium: Left atrial size was severely dilated.  Right Atrium: Right atrial size was severely dilated.  Pericardium: There is no evidence of pericardial effusion.  Mitral Valve: The mitral valve has been repaired/replaced. Severe mitral valve regurgitation, with centrally-directed jet. There is a Mitra-Clip present in the mitral position. Procedure Date: 10/10/2022. Mild mitral valve stenosis. MV peak gradient, 19.4 mmHg. The mean mitral valve gradient is 8.0 mmHg.  Tricuspid Valve: The tricuspid valve is normal in structure. Tricuspid valve regurgitation is severe. No evidence of tricuspid stenosis.  Aortic Valve: The aortic valve has been repaired/replaced. Aortic valve regurgitation is not visualized. No aortic stenosis is present. Aortic valve mean gradient measures 8.4 mmHg. Aortic valve peak gradient measures 15.6 mmHg. Aortic valve area, by VTI measures 2.76 cm. There is a 29 mm Edwards Sapien prosthetic, stented (TAVR) valve present in the aortic position. Procedure Date: 04/23/2023.  Pulmonic Valve: The pulmonic valve was normal in structure. Pulmonic valve regurgitation is trivial. No evidence of pulmonic stenosis.  Aorta: Aortic dilatation noted. There is mild dilatation of the ascending aorta, measuring 43 mm.  Venous: The inferior vena cava is normal in size with greater than 50% respiratory variability, suggesting right atrial pressure of 3 mmHg.  IAS/Shunts: No atrial level shunt  detected by color flow Doppler.   LEFT VENTRICLE PLAX 2D LVIDd:         5.00 cm LVIDs:         3.80 cm LV PW:         1.20 cm LV IVS:        1.20 cm LVOT diam:     2.55 cm   3D Volume EF: LV SV:         103       3D EF:        54 % LV SV Index:   46        LV EDV:       122 ml LVOT Area:     5.11 cm  LV ESV:       56 ml LV SV:        66 ml  RIGHT VENTRICLE            IVC RV Basal diam:  5.10 cm    IVC diam: 1.80 cm RV Mid diam:    3.70 cm  RV S prime:     8.51 cm/s TAPSE (M-mode): 1.8 cm  LEFT ATRIUM              Index        RIGHT ATRIUM           Index LA diam:        5.80 cm  2.55 cm/m   RA Area:     32.30 cm LA Vol (A2C):   116.0 ml 51.09 ml/m  RA Volume:   127.00 ml 55.94 ml/m LA Vol (A4C):   121.0 ml 53.30 ml/m LA Biplane Vol: 117.0 ml 51.53 ml/m AORTIC VALVE AV Area (Vmax):    2.75 cm AV Area (Vmean):   2.75 cm AV Area (VTI):     2.76 cm AV Vmax:           197.60 cm/s AV Vmean:          133.800 cm/s AV VTI:            0.374 m AV Peak Grad:      15.6 mmHg AV Mean Grad:      8.4 mmHg LVOT Vmax:         106.33 cm/s LVOT Vmean:        72.000 cm/s LVOT VTI:          0.202 m LVOT/AV VTI ratio: 0.54  AORTA Ao Root diam: 3.45 cm Ao Asc diam:  4.30 cm  MITRAL VALVE                  TRICUSPID VALVE MV Area (PHT): 2.72 cm       TR Peak grad:   57.5 mmHg MV Area VTI:   2.60 cm       TR Vmax:        379.00 cm/s MV Peak grad:  19.4 mmHg MV Mean grad:  8.0 mmHg       SHUNTS MV Vmax:       2.20 m/s       Systemic VTI:  0.20 m MV Vmean:      128.0 cm/s     Systemic Diam: 2.55 cm MR Peak grad:    111.1 mmHg MR Mean grad:    75.5 mmHg MR Vmax:         527.00 cm/s MR Vmean:        417.0 cm/s MR PISA:         4.02 cm MR PISA Eff ROA: 24 mm MR PISA Radius:  0.80 cm  Arvilla Meres MD Electronically signed by Arvilla Meres MD Signature Date/Time: 09/30/2023/11:22:08 AM    Final   TEE  ECHO TEE 12/26/2022  Narrative TRANSESOPHOGEAL ECHO  REPORT    Patient Name:   Ryan Garner Date of Exam: 12/26/2022 Medical Rec #:  [id number]      Height:       68.0 in Accession #:    [id number]     Weight:       222.0 lb Date of Birth:  23-Mar-1946       BSA:          2.136 m Patient Age:    76 years       BP:           99/54 mmHg Patient Gender: M              HR:           67 bpm. Exam Location:  Inpatient  Procedure:  Transesophageal Echo, Color Doppler and Cardiac Doppler  Indications:     Aortic Stenosis i35.0  History:         Patient has prior history of Echocardiogram examinations, most recent 11/16/2022. CHF, COPD; Risk Factors:Hypertension, Diabetes, Dyslipidemia and Sleep Apnea. 2 Pascal Mitral TEERs placed A2P2 06/01/23.  Sonographer:     Irving Burton Senior RDCS Referring Phys:  [id number] [facility] Diagnosing Phys: Ryan Garner  PROCEDURE: After discussion of the risks and benefits of a TEE, an informed consent was obtained from the patient. The transesophogeal probe was passed without difficulty through the esophogus of the patient. Sedation performed by different physician. The patient was monitored while under deep sedation. Anesthestetic sedation was provided intravenously by Anesthesiology: 231mg  of Propofol, 60mg  of Lidocaine. The patient developed no complications during the procedure.  IMPRESSIONS   1. Left ventricular ejection fraction, by estimation, is 45 to 50%. The left ventricle has mildly decreased function. The left ventricle demonstrates global hypokinesis. There is mild left ventricular hypertrophy. 2. D-shaped interventricular septum suggests a degree of RV pressure/volume overload. Peak RV-RA gradient 59 mmHg. Right ventricular systolic function is moderately reduced. The right ventricular size is moderately enlarged. 3. Left atrial size was severely dilated. No left atrial/left atrial appendage thrombus was detected. 4. Right atrial size was severely dilated. 5. 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 6.  S/p MV repair via mTEER with 2 Pascal devices that are well-seated. There is moderate residual mitral regurgitation. There is no systolic flow reversal on pulmonary vein doppler interrogation. Mild mitral stenosis. The mean mitral valve gradient is 5.0 mmHg. 7. There is severe AS, 0.4 cm^2 by planimetry and 0.88 cm^2 by Continuity Equation with mean gradient 27 mmHg (low flow/low gradient severe AS). The aortic valve is tricuspid. There is severe calcifcation of the aortic valve. Aortic valve regurgitation is not visualized.  FINDINGS Left Ventricle: Left ventricular ejection fraction, by estimation, is 45 to 50%. The left ventricle has mildly decreased function. The left ventricle demonstrates global hypokinesis. The left ventricular internal cavity size was normal in size. There is mild left ventricular hypertrophy.  Right Ventricle: D-shaped interventricular septum suggests a degree of RV pressure/volume overload. Peak RV-RA gradient 59 mmHg. The right ventricular size is moderately enlarged. No increase in right ventricular wall thickness. Right ventricular systolic function is moderately reduced.  Left Atrium: Left atrial size was severely dilated. No left atrial/left atrial appendage thrombus was detected.  Right Atrium: Right atrial size was severely dilated.  Pericardium: There is no evidence of pericardial effusion.  Mitral Valve: S/p MV repair via mTEER with 2 Pascal devices that are well-seated. There is moderate residual mitral regurgitation. There is no systolic flow reversal on pulmonary vein doppler interrogation. The mitral valve has been repaired/replaced. Moderate mitral valve regurgitation. Mild mitral valve stenosis. MV peak gradient, 13.5 mmHg. The mean mitral valve gradient is 5.0 mmHg.  Tricuspid Valve: The tricuspid valve is normal in structure. Tricuspid valve regurgitation is mild.  Aortic Valve: There is severe AS, 0.4 cm^2 by planimetry and 0.88 cm^2 by Continuity  Equation with mean gradient 27 mmHg (low flow/low gradient severe AS). The aortic valve is tricuspid. There is severe calcifcation of the aortic valve. Aortic valve regurgitation is not visualized. Aortic valve mean gradient measures 25.7 mmHg. Aortic valve peak gradient measures 41.3 mmHg. Aortic valve area, by VTI measures 0.90 cm.  Pulmonic Valve: The pulmonic valve was normal in structure. Pulmonic valve regurgitation is not visualized.  Aorta: The aortic root is normal  in size and structure.  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  Additional Comments: Spectral Doppler performed.  LEFT VENTRICLE PLAX 2D LVOT diam:     2.40 cm LV SV:         65 LV SV Index:   30 LVOT Area:     4.52 cm   AORTIC VALVE AV Area (Vmax):    1.09 cm AV Area (Vmean):   0.92 cm AV Area (VTI):     0.90 cm AV Vmax:           321.33 cm/s AV Vmean:          242.333 cm/s AV VTI:            0.726 m AV Peak Grad:      41.3 mmHg AV Mean Grad:      25.7 mmHg LVOT Vmax:         77.40 cm/s LVOT Vmean:        49.100 cm/s LVOT VTI:          0.144 m LVOT/AV VTI ratio: 0.20  MITRAL VALVE MV Area VTI:  1.58 cm    SHUNTS MV Peak grad: 13.5 mmHg   Systemic VTI:  0.14 m MV Mean grad: 5.0 mmHg    Systemic Diam: 2.40 cm MV Vmax:      1.84 m/s MV Vmean:     104.0 cm/s  Dalton McleanMD Electronically signed by Ryan Garner Signature Date/Time: 01/14/2023/9:29:16 AM    Final (Updated)    CT SCANS  CT CORONARY MORPH W/CTA COR W/SCORE 03/06/2023  Addendum 03/06/2023  2:00 PM ADDENDUM REPORT: 03/06/2023 13:57  EXAM: OVER-READ INTERPRETATION  CT CHEST  The following report is an over-read performed by radiologist Dr. Jacob Moores [facility] on 03/06/2023. This over-read does not include interpretation of cardiac or coronary anatomy or pathology. The cardiac TAVR interpretation by the cardiologist is attached.  COMPARISON:  None.  FINDINGS: Extracardiac findings  will be described separately under dictation for contemporaneously obtained CTA chest, abdomen and pelvis.  IMPRESSION: Please see separate dictation for contemporaneously obtained CTA chest, abdomen and pelvis dated 03/06/2023 for full description of relevant extracardiac findings.   Electronically Signed By: Allegra Lai M.D. On: 03/06/2023 13:57  Narrative CLINICAL DATA:  Aortic Valve pathology with assessment for TAVR  EXAM: Cardiac TAVR CT  TECHNIQUE: The patient was scanned on a Siemens Force 192 slice scanner. A 120 kV retrospective scan was triggered in the descending thoracic aorta at 111 HU's. Gantry rotation speed was 270 msecs and collimation was .9 mm. No beta blockade or nitro were given. The 3D data set was reconstructed in 5% intervals of the R-R cycle. Systolic and diastolic phases were analyzed on a dedicated work station using MPR, MIP and VRT modes. The patient received 100 cc of contrast.  FINDINGS: Aortic Valve: Severely thickened tri-leaflet aortic valve with heavy calcification and reduced excursion the planimeter valve area is 1.31 Sq cm consistent with moderate to severe aortic stenosis  Number of leaflets: Three  LVOT calcification: None  Annular calcification: Mild, under right coronary cusp  Aortic Valve Calcium Score: 2276  Presence of basal septal hypertrophy:Non-severe  Perimembranous septal diameter: 6 mm  Mitral Valve: There are two mTEER devices medial and lateral to the A2-P2 Position. There is no evidence of leaflet detachment. At best estimate, multi-planar triple outlet area 4.71 cm2.  Aortic Annulus Measurements- 20%  Major annulus diameter: 30 mm  Minor annulus diameter:25 mm  Annular perimeter: 86  mm  Annular area: 5.76 cm2  Aortic Root Measurements- 70%  Sinotubular Junction: 34 mm  Ascending Thoracic Aorta: 44 mm  Aortic Arch: 35 mm  Descending Thoracic Aorta: 27 mm  Aortic atherosclerosis.  Sinus  of Valsalva Measurements:  Right coronary cusp width: 34 mm  Left coronary cusp width: 35 mm  Non coronary cusp width: 35 mm  Coronary Artery Height above Annulus:  Left Main: 12 mm  Left SoV height: 19 mm  Right Coronary: 16 mm  Right SoV height: 20 mm  Optimum Fluoroscopic Angle for Delivery: LAO 4, CRA 16  Cusp overlay view angle:RAO 0, CRA 7  Valves for structural team consideration: 29 mm Sapien Valve favored, 34 mm Evolut Valve is feasible  Non TAVR Valve Findings:  Coronary Arteries: Normal coronary origin. Study not completed with nitroglycerin. Known coronary disease; calcium scored deferred  Systemic veins: Normal anatomy.  Main Pulmonary artery: Dilation of the main pulmonary artery: moderate at 33 mm  This can be associated with the presence of pulmonary hypertension; clinical correlation advised.  Pulmonary veins: Normal anatomy  Left atrial appendage: Patent  Interatrial septum: Iatrogenic ASD has resolved.  Left ventricle: Grossly normal size  Left atrium: Dilated  Right ventricle: Dilated  Right atrium: Dilated  Pericardium: No calcifications.  Extra Cardiac Findings as per separate reporting.  Notable artifacts: Mild motion artifact in systole and diastole  IMPRESSION: 1. Moderate to severe aortic stenosis. Findings pertinent to TAVR procedure are detailed above.  RECOMMENDATIONS:  The proposed cut-off value of 1,651 AU yielded a 93 % sensitivity and 75 % specificity in grading AS severity in patients with classical low-flow, low-gradient AS. Proposed different cut-off values to define severe AS for men and women as 2,065 AU and 1,274 AU, respectively. The joint European and American recommendations for the assessment of AS consider the aortic valve calcium score as a continuum - a very high calcium score suggests severe AS and a low calcium score suggests severe AS is unlikely.  Sunday Shams, et al. 2017  ESC/EACTS Guidelines for the management of valvular heart disease. Eur Heart J [phone]  Coronary artery calcium (CAC) score is a strong predictor of incident coronary heart disease (CHD) and provides predictive information beyond traditional risk factors. CAC scoring is reasonable to use in the decision to withhold, postpone, or initiate statin therapy in intermediate-risk or selected borderline-risk asymptomatic adults (age 54-75 years and LDL-C >=70 to <190 mg/dL) who do not have diabetes or established atherosclerotic cardiovascular disease (ASCVD).* In intermediate-risk (10-year ASCVD risk >=7.5% to <20%) adults or selected borderline-risk (10-year ASCVD risk >=5% to <7.5%) adults in whom a CAC score is measured for the purpose of making a treatment decision the following recommendations have been made:  If CAC = 0, it is reasonable to withhold statin therapy and reassess in 5 to 10 years, as long as higher risk conditions are absent (diabetes mellitus, family history of premature CHD in first degree relatives (males <55 years; females <65 years), cigarette smoking, LDL >=190 mg/dL or other independent risk factors).  If CAC is 1 to 99, it is reasonable to initiate statin therapy for patients >=30 years of age.  If CAC is >=100 or >=75th percentile, it is reasonable to initiate statin therapy at any age.  Cardiology referral should be considered for patients with CAC scores >=400 or >=75th percentile.  *2018 AHA/ACC/AACVPR/AAPA/ABC/ACPM/ADA/AGS/APhA/ASPC/NLA/PCNA Guideline on the Management of Blood Cholesterol: A Report of the American College of Cardiology/American Heart Association  Task Force on Clinical Practice Guidelines. J Am Coll Cardiol. 2019;73(24):3168-3209.  Mahesh  Chandrasekhar  Electronically Signed: By: Riley Lam M.D. On: 03/06/2023 12:51      ______________________________________________________________________________________________      EKG:   EKG Interpretation Date/Time:  Thursday October 31 2023 15:44:18 EDT Ventricular Rate:  71 PR Interval:    QRS Duration:  92 QT Interval:  408 QTC Calculation: 443 R Axis:   255  Text Interpretation: Atrial fibrillation Right superior axis deviation When compared with ECG of 06-May-2023 08:45, QRS duration has decreased QT has shortened Confirmed by Tonny Bollman [phone]) on 10/31/2023 4:07:30 PM    Recent Labs: 12/14/2022: B Natriuretic Peptide 363.3 04/19/2023: ALT 24 04/24/2023: Magnesium 2.0 09/04/2023: BUN 51; Creatinine, Ser 1.85; Potassium 5.0; Sodium 133 09/11/2023: Hemoglobin 14.4; Platelets 222  Recent Lipid Panel    Component Value Date/Time   CHOL 147 09/11/2023 1137   TRIG 96 09/11/2023 1137   TRIG 74 08/27/2014 0826   HDL 60 09/11/2023 1137   HDL 56 08/27/2014 0826   CHOLHDL 2.5 09/11/2023 1137   CHOLHDL 2.0 12/14/2022 1151   VLDL 8 12/14/2022 1151   LDLCALC 69 09/11/2023 1137   LDLCALC 78 11/13/2013 0911                Physical Exam:    VS:  BP 124/78 (BP Location: Left Arm, Patient Position: Sitting, Cuff Size: Large)   Pulse 62   Resp 16   Ht 5\' 7"  (1.702 m)   Wt 267 lb 12.8 oz (121.5 kg)   SpO2 97%   BMI 41.94 kg/m     Wt Readings from Last 3 Encounters:  10/31/23 267 lb 12.8 oz (121.5 kg)  09/30/23 262 lb 9.6 oz (119.1 kg)  09/17/23 260 lb (117.9 kg)     GEN: Pleasant, elderly obese gentleman in no acute distress HEENT: Normal NECK: Jugular venous pressure is moderately elevated; No carotid bruits LYMPHATICS: No lymphadenopathy CARDIAC: Irregularly irregular with no murmur or gallop RESPIRATORY:  Clear to auscultation without rales, wheezing or rhonchi  ABDOMEN: Soft, non-tender, non-distended MUSCULOSKELETAL: The left lower leg and ankle is in a wrap, the right leg has mild edema; No deformity  SKIN: Warm and dry NEUROLOGIC:   Alert and oriented x 3 PSYCHIATRIC:  Normal affect   Assessment & Plan Nonrheumatic mitral valve regurgitation The patient has recurrent mitral regurgitation after undergoing transcatheter edge-to-edge repair with 2 Pascal devices about 1 year ago.  This is associated with NYHA functional class II symptoms of HFpEF.  I personally reviewed his echo images which demonstrate normal LV systolic function with LVEF 55 to 60%, mild concentric LVH, normal function of his TAVR bioprosthesis with no paravalvular regurgitation and low gradients.  There is severe residual mitral regurgitation that appears to be just lateral to the Pascal devices.  There is mild mitral stenosis with a mean gradient ranging from 7 to 10 mmHg.  There is also severe tricuspid regurgitation.  Clinically, the patient is stable.  He does not wish to undergo further intervention and understands some of the limitations with another transcatheter edge-to-edge repair procedure which include mitral stenosis.  The patient also has a prominent picture of right heart failure based on my review of his echo.  His RV is dilated and hypokinetic and I think even with treatment of his mitral regurgitation, he will continue to suffer from chronic heart failure.  He is not inclined to have further procedures at this time.  I will  plan to see him back in 1 year with a repeat echocardiogram at that time. Persistent atrial fibrillation (HCC) Medically stable.  Continue apixaban.            Medication Adjustments/Labs and Tests Ordered: Current medicines are reviewed at length with the patient today.  Concerns regarding medicines are outlined above.  Orders Placed This Encounter  Procedures   EKG 12-Lead   No orders of the defined types were placed in this encounter.   Patient Instructions  Follow-Up: At [facility], you and your health needs are our priority.  As part of our continuing mission to provide you with exceptional heart  care, our providers are all part of one team.  This team includes your primary Cardiologist (physician) and Advanced Practice Providers or APPs (Physician Assistants and Nurse Practitioners) who all work together to provide you with the care you need, when you need it.  Your next appointment:   1 year(s)  Provider:   Tonny Bollman, MD     1st Floor: - Lobby - Registration  - Pharmacy  - Lab - Cafe  2nd Floor: - PV Lab - Diagnostic Testing (echo, CT, nuclear med)  3rd Floor: - Vacant  4th Floor: - TCTS (cardiothoracic surgery) - AFib Clinic - Structural Heart Clinic - Vascular Surgery  - Vascular Ultrasound  5th Floor: - HeartCare Cardiology (general and EP) - Clinical Pharmacy for coumadin, hypertension, lipid, weight-loss medications, and med management appointments    Valet parking services will be available as well.     Signed, Tonny Bollman, MD  10/31/2023 4:51 PM    [facility]

## 2023-10-31 NOTE — Patient Instructions (Signed)
 Follow-Up: At [facility], you and your health needs are our priority.  As part of our continuing mission to provide you with exceptional heart care, our providers are all part of one team.  This team includes your primary Cardiologist (physician) and Advanced Practice Providers or APPs (Physician Assistants and Nurse Practitioners) who all work together to provide you with the care you need, when you need it.  Your next appointment:   1 year(s)  Provider:   Tonny Bollman, MD        1st Floor: - Lobby - Registration  - Pharmacy  - Lab - Cafe  2nd Floor: - PV Lab - Diagnostic Testing (echo, CT, nuclear med)  3rd Floor: - Vacant  4th Floor: - TCTS (cardiothoracic surgery) - AFib Clinic - Structural Heart Clinic - Vascular Surgery  - Vascular Ultrasound  5th Floor: - HeartCare Cardiology (general and EP) - Clinical Pharmacy for coumadin, hypertension, lipid, weight-loss medications, and med management appointments    Valet parking services will be available as well.

## 2023-10-31 NOTE — Assessment & Plan Note (Signed)
 The patient has recurrent mitral regurgitation after undergoing transcatheter edge-to-edge repair with 2 Pascal devices about 1 year ago.  This is associated with NYHA functional class II symptoms of HFpEF.  I personally reviewed his echo images which demonstrate normal LV systolic function with LVEF 55 to 60%, mild concentric LVH, normal function of his TAVR bioprosthesis with no paravalvular regurgitation and low gradients.  There is severe residual mitral regurgitation that appears to be just lateral to the Pascal devices.  There is mild mitral stenosis with a mean gradient ranging from 7 to 10 mmHg.  There is also severe tricuspid regurgitation.  Clinically, the patient is stable.  He does not wish to undergo further intervention and understands some of the limitations with another transcatheter edge-to-edge repair procedure which include mitral stenosis.  The patient also has a prominent picture of right heart failure based on my review of his echo.  His RV is dilated and hypokinetic and I think even with treatment of his mitral regurgitation, he will continue to suffer from chronic heart failure.  He is not inclined to have further procedures at this time.  I will plan to see him back in 1 year with a repeat echocardiogram at that time.

## 2023-10-31 NOTE — Assessment & Plan Note (Signed)
 Medically stable.  Continue apixaban.

## 2023-11-05 ENCOUNTER — Ambulatory Visit: Admitting: Orthopedic Surgery

## 2023-11-05 DIAGNOSIS — I872 Venous insufficiency (chronic) (peripheral): Secondary | ICD-10-CM

## 2023-11-05 DIAGNOSIS — L97921 Non-pressure chronic ulcer of unspecified part of left lower leg limited to breakdown of skin: Secondary | ICD-10-CM | POA: Diagnosis not present

## 2023-11-05 DIAGNOSIS — I890 Lymphedema, not elsewhere classified: Secondary | ICD-10-CM | POA: Diagnosis not present

## 2023-11-06 ENCOUNTER — Encounter: Payer: Self-pay | Admitting: Orthopedic Surgery
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
## 2023-11-12 ENCOUNTER — Other Ambulatory Visit: Payer: Self-pay | Admitting: *Deleted

## 2023-11-12 ENCOUNTER — Encounter: Payer: Self-pay | Admitting: *Deleted

## 2023-11-12 ENCOUNTER — Ambulatory Visit (INDEPENDENT_AMBULATORY_CARE_PROVIDER_SITE_OTHER): Admitting: Family

## 2023-11-12 DIAGNOSIS — I890 Lymphedema, not elsewhere classified: Secondary | ICD-10-CM | POA: Diagnosis not present

## 2023-11-12 DIAGNOSIS — L97921 Non-pressure chronic ulcer of unspecified part of left lower leg limited to breakdown of skin: Secondary | ICD-10-CM

## 2023-11-12 DIAGNOSIS — I872 Venous insufficiency (chronic) (peripheral): Secondary | ICD-10-CM

## 2023-11-26 ENCOUNTER — Ambulatory Visit: Admitting: Family

## 2023-11-27 ENCOUNTER — Encounter: Payer: Self-pay | Admitting: Family

## 2023-11-27 ENCOUNTER — Ambulatory Visit (INDEPENDENT_AMBULATORY_CARE_PROVIDER_SITE_OTHER): Admitting: Orthopedic Surgery

## 2023-11-27 DIAGNOSIS — I890 Lymphedema, not elsewhere classified: Secondary | ICD-10-CM | POA: Diagnosis not present

## 2023-11-27 DIAGNOSIS — L97921 Non-pressure chronic ulcer of unspecified part of left lower leg limited to breakdown of skin: Secondary | ICD-10-CM

## 2023-11-27 DIAGNOSIS — I872 Venous insufficiency (chronic) (peripheral): Secondary | ICD-10-CM
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
## 2023-11-28 ENCOUNTER — Encounter: Payer: Self-pay | Admitting: Orthopedic Surgery
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
## 2023-11-29 ENCOUNTER — Ambulatory Visit (INDEPENDENT_AMBULATORY_CARE_PROVIDER_SITE_OTHER): Admitting: Family Medicine

## 2023-11-29 ENCOUNTER — Ambulatory Visit: Payer: Self-pay

## 2023-11-29 VITALS — BP 122/72 | HR 76 | Temp 98.2°F | Ht 67.0 in | Wt 272.0 lb

## 2023-11-29 DIAGNOSIS — L309 Dermatitis, unspecified: Secondary | ICD-10-CM

## 2023-11-29 DIAGNOSIS — L89312 Pressure ulcer of right buttock, stage 2: Secondary | ICD-10-CM

## 2023-11-29 MED ORDER — SULFAMETHOXAZOLE-TRIMETHOPRIM 800-160 MG PO TABS: 1.0000 | ORAL_TABLET | Freq: Two times a day (BID) | ORAL | 0 refills | Status: DC

## 2023-11-29 NOTE — Telephone Encounter (Signed)
  Chief Complaint: right leg rash Symptoms: painful right leg/hip blister rash, intermittent itchiness Frequency: x 1 month Pertinent Negatives: Patient denies fever, SOB Disposition: [] ED /[] Urgent Care (no appt availability in office) / [x] Appointment(In office/virtual)/ []  [facility]/ [] Home Care/ [] Refused Recommended Disposition /[] [facility]/ []  Follow-up with PCP Additional Notes: Patient states he has never had shingles before. He does admit the rash has drainage but denies any pus. Patient scheduled with DOD.  Copied from CRM [id number]. Topic: Clinical - Red Word Triage >> Nov 29, 2023  7:47 AM Ryan Garner wrote: [facility] that prompted transfer to Nurse Triage: blisters and rt leg pain Reason for Disposition  [1] Localized rash is very painful AND [2] no fever  Answer Assessment - Initial Assessment Questions 1. APPEARANCE of RASH: "Describe the rash."      Blistered, raw.  2. LOCATION: "Where is the rash located?"      Right leg (top), right hip (bottom).  3. NUMBER: "How many spots are there?"      All one large clump.  4. SIZE: "How big are the spots?" (Inches, centimeters or compare to size of a coin)      About the size of his hand.  5. ONSET: "When did the rash start?"      About a month ago.  6. ITCHING: "Does the rash itch?" If Yes, ask: "How bad is the itch?"  (Scale 0-10; or none, mild, moderate, severe)     Yes, sometimes and severe when it does itch.  7. PAIN: "Does the rash hurt?" If Yes, ask: "How bad is the pain?"  (Scale 0-10; or none, mild, moderate, severe)    - NONE (0): no pain    - MILD (1-3): doesn't interfere with normal activities     - MODERATE (4-7): interferes with normal activities or awakens from sleep     - SEVERE (8-10): excruciating pain, unable to do any normal activities     7-8/10.  8. OTHER SYMPTOMS: "Do you have any other symptoms?" (e.g., fever)     Denies.  9. PREGNANCY: "Is there any chance you are  pregnant?" "When was your last menstrual period?"     N/A.  Protocols used: Rash or Redness - Localized-A-AH

## 2023-11-29 NOTE — Progress Notes (Signed)
 Subjective:  Patient ID: Ryan Garner, male    DOB: 06/11/1946, 78 y.o.   MRN: [id number]  Patient Care Team: [name], Lucio Sabin, MD as PCP - General (Family Medicine) Elmyra Haggard, MD as PCP - Cardiology (Cardiology) Arlyce Berger, RN as [facility] Care Management   Chief Complaint:  Ryan Garner (Right hip/leg/painful/X 1 month/)   HPI: Ryan Garner is a 78 y.o. male presenting on 11/29/2023 for Blister (Right hip/leg/painful/X 1 month/)  HPI Patient presents today for right hip, leg pain with rash that started about one month ago.  States that he has tried a medicated cream that he had at home on it, he is unsure of what type of cream it is. Reports some itching, burning. Endorses tingling. Denies fever, N/V.   Relevant past medical, surgical, family, and social history reviewed and updated as indicated.  Allergies and medications reviewed and updated. Data reviewed: Chart in Epic.   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 Arlester Ladd and Dr. 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 Abel Hoe & Dr. Sherene Dilling   Sleep apnea    STEMI (ST elevation myocardial infarction) [facility])     Past Surgical History:  Procedure Laterality Date   APPENDECTOMY     BUBBLE STUDY  10/02/2022   Procedure: BUBBLE STUDY;  Surgeon: Darlis Eisenmenger, MD;  Location: [facility] ENDOSCOPY;  Service: Cardiovascular;;   CHOLECYSTECTOMY N/A 03/02/2020   Procedure: LAPAROSCOPIC CHOLECYSTECTOMY;  Surgeon: Alanda Allegra, MD;  Location: [facility] ORS;  Service: General;   Laterality: N/A;   CORONARY STENT INTERVENTION N/A 04/30/2020   Procedure: CORONARY STENT INTERVENTION;  Surgeon: Swaziland, Peter M, MD;  Location: [facility] INVASIVE CV LAB;  Service: Cardiovascular;  Laterality: N/A;   CORONARY/GRAFT ACUTE MI REVASCULARIZATION N/A 04/30/2020   Procedure: Coronary/Graft Acute MI Revascularization;  Surgeon: Swaziland, Peter M, MD;  Location: [facility] INVASIVE CV LAB;  Service: Cardiovascular;  Laterality: N/A;   Eyelid Surgery Bilateral    HERNIA REPAIR     I & D EXTREMITY Left 04/06/2022   Procedure: LEFT LEG DEBRIDEMENT;  Surgeon: Timothy Ford, MD;  Location: [facility] OR;  Service: Orthopedics;  Laterality: Left;   I & D EXTREMITY Left 04/04/2022   Procedure: LEFT LEG DEBRIDEMENT;  Surgeon: Timothy Ford, MD;  Location: [facility] OR;  Service: Orthopedics;  Laterality: Left;   INTRAOPERATIVE TRANSTHORACIC ECHOCARDIOGRAM N/A 04/23/2023   Procedure: INTRAOPERATIVE TRANSTHORACIC ECHOCARDIOGRAM;  Surgeon: Odie Benne, MD;  Location: [facility] INVASIVE CV LAB;  Service: Open Heart Surgery;  Laterality: N/A;   knee tendons repair     LEFT HEART CATH AND CORONARY ANGIOGRAPHY N/A 04/30/2020   Procedure: LEFT HEART CATH AND CORONARY ANGIOGRAPHY;  Surgeon: Swaziland, Peter M, MD;  Location: [facility] INVASIVE CV LAB;  Service: Cardiovascular;  Laterality: N/A;   RIGHT HEART CATH N/A 10/03/2022   Procedure: RIGHT HEART CATH;  Surgeon: Darlis Eisenmenger, MD;  Location: [facility] INVASIVE CV LAB;  Service: Cardiovascular;  Laterality: N/A;  RIGHT HEART CATH N/A 12/26/2022   Procedure: RIGHT HEART CATH;  Surgeon: Darlis Eisenmenger, MD;  Location: [facility] INVASIVE CV LAB;  Service: Cardiovascular;  Laterality: N/A;   RIGHT/LEFT HEART CATH AND CORONARY ANGIOGRAPHY N/A 02/05/2023   Procedure: RIGHT/LEFT HEART CATH AND CORONARY ANGIOGRAPHY;  Surgeon: Darlis Eisenmenger, MD;  Location: [facility] INVASIVE CV LAB;  Service: Cardiovascular;  Laterality: N/A;   ROTATOR CUFF REPAIR Bilateral    TEE WITHOUT CARDIOVERSION N/A 10/02/2022    Procedure: TRANSESOPHAGEAL ECHOCARDIOGRAM (TEE);  Surgeon: Darlis Eisenmenger, MD;  Location: [facility] ENDOSCOPY;  Service: Cardiovascular;  Laterality: N/A;   TEE WITHOUT CARDIOVERSION N/A 10/10/2022   Procedure: TRANSESOPHAGEAL ECHOCARDIOGRAM;  Surgeon: Arnoldo Lapping, MD;  Location: [facility] INVASIVE CV LAB;  Service: Cardiovascular;  Laterality: N/A;   TEE WITHOUT CARDIOVERSION N/A 12/26/2022   Procedure: TRANSESOPHAGEAL ECHOCARDIOGRAM;  Surgeon: Darlis Eisenmenger, MD;  Location: [facility] INVASIVE CV LAB;  Service: Cardiovascular;  Laterality: N/A;   TONSILLECTOMY     TRANSCATHETER AORTIC VALVE REPLACEMENT, TRANSFEMORAL N/A 04/23/2023   Procedure: Transcatheter Aortic Valve Replacement, Transfemoral;  Surgeon: Odie Benne, MD;  Location: [facility] INVASIVE CV LAB;  Service: Open Heart Surgery;  Laterality: N/A;   TRANSCATHETER MITRAL EDGE TO EDGE REPAIR N/A 10/10/2022   Procedure: MITRAL VALVE REPAIR;  Surgeon: Arnoldo Lapping, MD;  Location: [facility] INVASIVE CV LAB;  Service: Cardiovascular;  Laterality: N/A;    Social History   Socioeconomic History   Marital status: Widowed    Spouse name: Delores Fester   Number of children: 2   Years of education: 20   Highest education level: Doctorate  Occupational History   Occupation: Education officer, 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  Other Topics Concern   Not on file  Social History Narrative   Not on file   Social Drivers of Health   Financial Resource Strain: Low Risk  (11/29/2023)   Overall Financial Resource Strain (CARDIA)    Difficulty of Paying Living Expenses: Not hard at all  Food Insecurity: No Food Insecurity  (11/29/2023)   Hunger Vital Sign    Worried About Running Out of Food in the Last Year: Never true    Ran Out of Food in the Last Year: Never true  Transportation Needs: No Transportation Needs (11/29/2023)   PRAPARE - Administrator, Civil Service (Medical): No    Lack of Transportation (Non-Medical): No  Physical Activity: Sufficiently Active (11/29/2023)   Exercise Vital Sign    Days of Exercise per Week: 3 days    Minutes of Exercise per Session: 50 min  Stress: No Stress Concern Present (11/29/2023)   [name]    Feeling of Stress : Not at all  Social Connections: Moderately Integrated (11/29/2023)   Social Connection and Isolation Panel [NHANES]    Frequency of Communication with Friends and Family: More than three times a week    Frequency of Social Gatherings with Friends and Family: Once a week    Attends Religious Services: More than 4  times per year    Active Member of Clubs or Organizations: Yes    Attends Banker Meetings: More than 4 times per year    Marital Status: Widowed  Intimate Partner Violence: Not At Risk (10/07/2022)   Humiliation, Afraid, Rape, and Kick questionnaire    Fear of Current or Ex-Partner: No    Emotionally Abused: No    Physically Abused: No    Sexually Abused: No    Outpatient Encounter Medications as of 11/29/2023  Medication Sig   apixaban  (ELIQUIS ) 5 MG TABS tablet Take 1 tablet (5 mg total) by mouth 2 (two) times daily.   ascorbic acid  (NATURAL C/ROSE HIPS) 1000 MG tablet Take 1 tablet (1,000 mg total) by mouth daily.   atorvastatin  (LIPITOR ) 80 MG tablet Take 1 tablet by mouth once daily   dapagliflozin  propanediol (FARXIGA ) 10 MG TABS tablet Take 1 tablet (10 mg total) by mouth in the morning.   fluticasone  (FLONASE ) 50 MCG/ACT nasal spray Place 2 sprays into both nostrils daily.   Multiple Vitamin (MULTIVITAMIN WITH MINERALS) TABS tablet Take 1 tablet by  mouth daily.   nitroGLYCERIN  (NITROSTAT ) 0.4 MG SL tablet Place 1 tablet (0.4 mg total) under the tongue every 5 (five) minutes x 3 doses as needed for chest pain.   spironolactone  (ALDACTONE ) 25 MG tablet Take 25 mg by mouth daily.   torsemide  (DEMADEX ) 20 MG tablet Take 3 tablets (60 mg total) by mouth 2 (two) times daily.   No facility-administered encounter medications on file as of 11/29/2023.    Allergies  Allergen Reactions   Flecainide  Other (See Comments)    dizziness, shortness of breath, blurred vision   Metoprolol  Tartrate Itching   Rofecoxib Rash and Other (See Comments)    (Vioxx)    Review of Systems As per HPI  Objective:  BP 122/72   Pulse 76   Temp 98.2 F (36.8 C)   Ht 5\' 7"  (1.702 m)   Wt 272 lb (123.4 kg)   SpO2 97%   BMI 42.60 kg/m    Wt Readings from Last 3 Encounters:  10/31/23 267 lb 12.8 oz (121.5 kg)  09/30/23 262 lb 9.6 oz (119.1 kg)  09/17/23 260 lb (117.9 kg)   Physical Exam Constitutional:      General: He is awake. He is not in acute distress.    Appearance: Normal appearance. He is well-developed and well-groomed. He is obese. He is not ill-appearing, toxic-appearing or diaphoretic.  Cardiovascular:     Rate and Rhythm: Normal rate and regular rhythm.     Heart sounds: Normal heart sounds. No murmur heard.    No gallop.  Pulmonary:     Effort: Pulmonary effort is normal. No respiratory distress.     Breath sounds: Normal breath sounds. No stridor. No wheezing, rhonchi or rales.  Musculoskeletal:     Cervical back: Full passive range of motion without pain and neck supple.  Skin:    General: Skin is warm.     Capillary Refill: Capillary refill takes less than 2 seconds.          Comments: Erythematous, confluent excoriated tissue   Neurological:     General: No focal deficit present.     Mental Status: He is alert, oriented to person, place, and time and easily aroused. Mental status is at baseline.     GCS: GCS eye subscore is 4.  GCS verbal subscore is 5. GCS motor subscore is 6.     Motor: No  weakness.  Psychiatric:        Attention and Perception: Attention and perception normal.        Mood and Affect: Mood and affect normal.        Speech: Speech normal.        Behavior: Behavior normal. Behavior is cooperative.        Thought Content: Thought content normal. Thought content does not include homicidal or suicidal ideation. Thought content does not include homicidal or suicidal plan.        Cognition and Memory: Cognition and memory normal.        Judgment: Judgment normal.       Results for orders placed or performed in visit on 09/30/23  ECHOCARDIOGRAM COMPLETE   Collection Time: 09/30/23 11:00 AM  Result Value Ref Range   Weight 4,201.6 oz   Height 67 in   BP 120/60 mmHg   S' Lateral 3.80 cm   AV Area VTI 2.76 cm2   AV Mean grad 8.4 mmHg   AV Area mean vel 2.75 cm2   Area-P 1/2 2.72 cm2   AR max vel 2.75 cm2   AV Peak grad 15.6 mmHg   Ao pk vel 1.98 m/s   Radius 0.80 cm   MV M vel 5.27 m/s   MV Peak grad 111.1 mmHg   MV VTI 2.60 cm2   Est EF 55 - 60%        11/29/2023    9:53 AM 04/15/2023   11:13 AM 10/15/2022    9:23 AM 08/23/2022    2:33 PM 07/13/2022   10:37 AM  Depression screen PHQ 2/9  Decreased Interest 0 0 0 0 0  Down, Depressed, Hopeless 0 0 0 0 0  PHQ - 2 Score 0 0 0 0 0  Altered sleeping  1 0 0 0  Tired, decreased energy  1 0 0 0  Change in appetite  0 0 0 0  Feeling bad or failure about yourself   0 0 0 0  Trouble concentrating  0 0 0 0  Moving slowly or fidgety/restless  0 0 0 0  Suicidal thoughts  0 0 0 0  PHQ-9 Score  2 0 0 0  Difficult doing work/chores  Not difficult at all Not difficult at all Not difficult at all Not difficult at all       11/29/2023    9:54 AM 04/15/2023   11:13 AM 10/15/2022    9:23 AM 08/23/2022    2:33 PM  GAD 7 : Generalized Anxiety Score  Nervous, Anxious, on Edge 0 0 0 0  Control/stop worrying 0 0 0 0  Worry too much - different things 0  0 0 0  Trouble relaxing 0 0 0 0  Restless 0 0 0 0  Easily annoyed or irritable 0 0 0 0  Afraid - awful might happen 0 0 0 0  Total GAD 7 Score 0 0 0 0  Anxiety Difficulty Not difficult at all Not difficult at all Not difficult at all Not difficult at all    Pertinent labs & imaging results that were available during my care of the patient were reviewed by me and considered in my medical decision making.  Assessment & Plan:  Keynen was seen today for blister.  Diagnoses and all orders for this visit:  1. Pressure injury of right buttock, stage 2 (HCC) (Primary) Dressed wound with xeroform and mepilex dressing. Discussed wound care at home with patient. Discussed at  length changing positions frequently and pressure off-loading. Will have close follow up to monitor for healing. Will start medication as below given length of time patient has had wound.  - sulfamethoxazole -trimethoprim  (BACTRIM  DS) 800-160 MG tablet; Take 1 tablet by mouth 2 (two) times daily.  Dispense: 14 tablet; Refill: 0  2. Dermatitis As above.  - sulfamethoxazole -trimethoprim  (BACTRIM  DS) 800-160 MG tablet; Take 1 tablet by mouth 2 (two) times daily.  Dispense: 14 tablet; Refill: 0  Continue all other maintenance medications.  Follow up plan: Return in about 2 weeks (around 12/13/2023).   Continue healthy lifestyle choices, including diet (rich in fruits, vegetables, and lean proteins, and low in salt and simple carbohydrates) and exercise (at least 30 minutes of moderate physical activity daily).  Written and verbal instructions provided   The above assessment and management plan was discussed with the patient. The patient verbalized understanding of and has agreed to the management plan. Patient is aware to call the clinic if they develop any new symptoms or if symptoms persist or worsen. Patient is aware when to return to the clinic for a follow-up visit. Patient educated on when it is appropriate to go to the  emergency department.   Jacqualyn Mates, DNP-FNP [facility] [address] [place], [place] [address] [phone]

## 2023-12-02 ENCOUNTER — Encounter (HOSPITAL_COMMUNITY): Payer: Self-pay

## 2023-12-09 ENCOUNTER — Ambulatory Visit: Payer: Medicare Other | Admitting: Family Medicine

## 2023-12-09 ENCOUNTER — Encounter: Payer: Self-pay | Admitting: Family Medicine

## 2023-12-09 VITALS — BP 105/63 | HR 83 | Ht 67.0 in | Wt 265.0 lb

## 2023-12-09 DIAGNOSIS — I25118 Atherosclerotic heart disease of native coronary artery with other forms of angina pectoris: Secondary | ICD-10-CM | POA: Diagnosis not present

## 2023-12-09 DIAGNOSIS — L89312 Pressure ulcer of right buttock, stage 2: Secondary | ICD-10-CM

## 2023-12-09 DIAGNOSIS — I4819 Other persistent atrial fibrillation: Secondary | ICD-10-CM

## 2023-12-09 DIAGNOSIS — E119 Type 2 diabetes mellitus without complications: Secondary | ICD-10-CM | POA: Insufficient documentation

## 2023-12-09 DIAGNOSIS — J449 Chronic obstructive pulmonary disease, unspecified: Secondary | ICD-10-CM

## 2023-12-09 DIAGNOSIS — E1169 Type 2 diabetes mellitus with other specified complication: Secondary | ICD-10-CM

## 2023-12-09 DIAGNOSIS — I10 Essential (primary) hypertension: Secondary | ICD-10-CM

## 2023-12-09 DIAGNOSIS — Z7984 Long term (current) use of oral hypoglycemic drugs: Secondary | ICD-10-CM | POA: Diagnosis not present

## 2023-12-09 DIAGNOSIS — Z1211 Encounter for screening for malignant neoplasm of colon: Secondary | ICD-10-CM

## 2023-12-09 DIAGNOSIS — E782 Mixed hyperlipidemia: Secondary | ICD-10-CM

## 2023-12-09 LAB — BAYER DCA HB A1C WAIVED: HB A1C (BAYER DCA - WAIVED): 6.3 % — ABNORMAL HIGH (ref 4.8–5.6)

## 2023-12-09 NOTE — Progress Notes (Signed)
 BP 105/63   Pulse 83   Ht 5\' 7"  (1.702 m)   Wt 265 lb (120.2 kg)   SpO2 97%   BMI 41.50 kg/m    Subjective:   Patient ID: Ryan Garner, male    DOB: 10-04-1945, 78 y.o.   MRN: [id number]  HPI: Ryan Garner is a 78 y.o. male presenting on 12/09/2023 for Medical Management of Chronic Issues, Diabetes, and Hypertension   HPI Pressure sores on right lower buttock Patient has pressure sores the right lower buttock and he has been changing dressings on there every day.  He has been trying to get up more often.  He also puts triple antibiotic ointment on it as well.  He says it was getting better and then he ran out of the bandages and could not find them at the store and that has been getting worse.  Type 2 diabetes mellitus Patient comes in today for recheck of his diabetes. Patient has been currently taking Farxiga . Patient is not currently on an ACE inhibitor/ARB. Patient has not seen an ophthalmologist this year. Patient denies any new issues with their feet. The symptom started onset as an adult hypertension and hyperlipidemia and CAD and A-fib ARE RELATED TO DM   Hypertension and A-fib Patient is currently on spironolactone  torsemide , and their blood pressure today is 105/63. Patient denies any lightheadedness or dizziness. Patient denies headaches, blurred vision, chest pains, shortness of breath, or weakness. Denies any side effects from medication and is content with current medication.   Hyperlipidemia and CAD, sees cardiology Patient is coming in for recheck of his hyperlipidemia. The patient is currently taking atorvastatin . They deny any issues with myalgias or history of liver damage from it. They deny any focal numbness or weakness or chest pain.   Relevant past medical, surgical, family and social history reviewed and updated as indicated. Interim medical history since our last visit reviewed. Allergies and medications reviewed and updated.  Review of Systems   Constitutional:  Negative for chills and fever.  Eyes:  Negative for visual disturbance.  Respiratory:  Negative for shortness of breath and wheezing.   Cardiovascular:  Negative for chest pain and leg swelling.  Musculoskeletal:  Negative for back pain and gait problem.  Skin:  Negative for rash.  Neurological:  Negative for dizziness, weakness and light-headedness.  All other systems reviewed and are negative.   Per HPI unless specifically indicated above   Allergies as of 12/09/2023       Reactions   Flecainide  Other (See Comments)   dizziness, shortness of breath, blurred vision   Metoprolol  Tartrate Itching   Rofecoxib Rash, Other (See Comments)   (Vioxx)        Medication List        Accurate as of Dec 09, 2023  2:02 PM. If you have any questions, ask your nurse or doctor.          apixaban  5 MG Tabs tablet Commonly known as: ELIQUIS  Take 1 tablet (5 mg total) by mouth 2 (two) times daily.   ascorbic acid  1000 MG tablet Commonly known as: Natural C/Rose Hips Take 1 tablet (1,000 mg total) by mouth daily.   atorvastatin  80 MG tablet Commonly known as: LIPITOR  Take 1 tablet by mouth once daily   dapagliflozin  propanediol 10 MG Tabs tablet Commonly known as: Farxiga  Take 1 tablet (10 mg total) by mouth in the morning.   fluticasone  50 MCG/ACT nasal spray Commonly known as: FLONASE  Place 2  sprays into both nostrils daily.   multivitamin with minerals Tabs tablet Take 1 tablet by mouth daily.   nitroGLYCERIN  0.4 MG SL tablet Commonly known as: NITROSTAT  Place 1 tablet (0.4 mg total) under the tongue every 5 (five) minutes x 3 doses as needed for chest pain.   spironolactone  25 MG tablet Commonly known as: ALDACTONE  Take 25 mg by mouth daily.   sulfamethoxazole -trimethoprim  800-160 MG tablet Commonly known as: BACTRIM  DS Take 1 tablet by mouth 2 (two) times daily.   torsemide  20 MG tablet Commonly known as: DEMADEX  Take 3 tablets (60 mg total)  by mouth 2 (two) times daily.               Durable Medical Equipment  (From admission, onward)           Start     Ordered   12/09/23 0000  For home use only DME Other see comment       Comments: Telfa plus barrier island dressing, 4 inch x 6 inch, change daily #30, 1 refill Diagnosis stage II pressure sore of buttock  Question:  Length of Need  Answer:  6 Months   12/09/23 1348             Objective:   BP 105/63   Pulse 83   Ht 5\' 7"  (1.702 m)   Wt 265 lb (120.2 kg)   SpO2 97%   BMI 41.50 kg/m   Wt Readings from Last 3 Encounters:  12/09/23 265 lb (120.2 kg)  11/29/23 272 lb (123.4 kg)  10/31/23 267 lb 12.8 oz (121.5 kg)    Physical Exam Vitals and nursing note reviewed.  Constitutional:      General: He is not in acute distress.    Appearance: He is well-developed. He is not diaphoretic.  Eyes:     General: No scleral icterus.    Conjunctiva/sclera: Conjunctivae normal.  Neck:     Thyroid : No thyromegaly.  Cardiovascular:     Rate and Rhythm: Normal rate and regular rhythm.     Heart sounds: Normal heart sounds. No murmur heard. Pulmonary:     Effort: Pulmonary effort is normal. No respiratory distress.     Breath sounds: Normal breath sounds. No wheezing.  Musculoskeletal:        General: No swelling. Normal range of motion.     Cervical back: Neck supple.  Lymphadenopathy:     Cervical: No cervical adenopathy.  Skin:    General: Skin is warm and dry.     Findings: Wound (5 cm x 5 cm area of scattered small ulcerations on right lower buttock, very superficial, skin breakdown only) present. No rash.  Neurological:     Mental Status: He is alert and oriented to person, place, and time.     Coordination: Coordination normal.  Psychiatric:        Behavior: Behavior normal.       Assessment & Plan:   Problem List Items Addressed This Visit       Cardiovascular and Mediastinum   Hypertension   Persistent atrial fibrillation (HCC)    CAD (coronary artery disease)     Respiratory   COPD (chronic obstructive pulmonary disease) (HCC)     Endocrine   Diabetes mellitus (HCC) - Primary   Relevant Orders   Bayer DCA Hb A1c Waived   CBC with Differential/Platelet     Other   Hyperlipidemia   Other Visit Diagnoses       Pressure injury of  right buttock, stage 2 (HCC)       Relevant Orders   For home use only DME Other see comment     Colon cancer screening       Relevant Orders   Cologuard     A1c is 6.3 and looks good.  Blood pressure on the lower side but seeing cardiology, instructed to continue to work with them.  Placed bandage on wound on buttocks, instructed him to change it daily with putting some Vaseline or barrier cream and to get up and move more frequently.  Follow up plan: Return in about 3 months (around 03/10/2024), or if symptoms worsen or fail to improve, for Diabetes recheck.  Counseling provided for all of the vaccine components Orders Placed This Encounter  Procedures   For home use only DME Other see comment   Bayer DCA Hb A1c Waived   CBC with Differential/Platelet   Cologuard    Jolyne Needs, MD [facility] 12/09/2023, 2:02 PM

## 2023-12-10 LAB — CBC WITH DIFFERENTIAL/PLATELET
Basophils Absolute: 0 10*3/uL (ref 0.0–0.2)
Basos: 0 %
EOS (ABSOLUTE): 0.1 10*3/uL (ref 0.0–0.4)
Eos: 1 %
Hematocrit: 42.9 % (ref 37.5–51.0)
Hemoglobin: 14.3 g/dL (ref 13.0–17.7)
Immature Grans (Abs): 0 10*3/uL (ref 0.0–0.1)
Immature Granulocytes: 1 %
Lymphocytes Absolute: 1 10*3/uL (ref 0.7–3.1)
Lymphs: 11 %
MCH: 30.5 pg (ref 26.6–33.0)
MCHC: 33.3 g/dL (ref 31.5–35.7)
MCV: 92 fL (ref 79–97)
Monocytes Absolute: 0.9 10*3/uL (ref 0.1–0.9)
Monocytes: 10 %
Neutrophils Absolute: 6.8 10*3/uL (ref 1.4–7.0)
Neutrophils: 77 %
Platelets: 305 10*3/uL (ref 150–450)
RBC: 4.69 x10E6/uL (ref 4.14–5.80)
RDW: 12.1 % (ref 11.6–15.4)
WBC: 8.8 10*3/uL (ref 3.4–10.8)

## 2023-12-11 ENCOUNTER — Ambulatory Visit: Payer: Self-pay | Admitting: Family Medicine

## 2023-12-11 DIAGNOSIS — R195 Other fecal abnormalities: Secondary | ICD-10-CM

## 2023-12-17 ENCOUNTER — Other Ambulatory Visit: Payer: Self-pay

## 2023-12-17 ENCOUNTER — Encounter: Payer: Self-pay | Admitting: *Deleted

## 2023-12-17 ENCOUNTER — Other Ambulatory Visit: Payer: Self-pay | Admitting: *Deleted

## 2023-12-17 DIAGNOSIS — Z1211 Encounter for screening for malignant neoplasm of colon: Secondary | ICD-10-CM | POA: Diagnosis not present

## 2023-12-17 NOTE — Patient Outreach (Signed)
 Complex Care Management   Visit Note  12/17/2023  Name:  Ryan Garner MRN: [id number] DOB: Dec 21, 1945  Situation: Referral received for Complex Care Management related to Heart Failure and COPD I obtained verbal consent from Patient.  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 Arlester Ladd and Dr. 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 Abel Hoe & Dr. Sherene Dilling   Sleep apnea    STEMI (ST elevation myocardial infarction) [facility])     Assessment: Patient Reported Symptoms:  Cognitive Cognitive Status: Alert and oriented to person, place, and time, Insightful and able to interpret abstract concepts, Normal speech and language skills Cognitive/Intellectual Conditions Management [RPT]: None reported or documented in medical history or problem list   Health Maintenance Behaviors: Annual physical exam, Sleep adequate, Spiritual practice(s) Healing Pattern: Average Health Facilitated by: Rest, Prayer/meditation  Neurological Neurological Review of Symptoms: No symptoms reported    HEENT HEENT Symptoms Reported: No symptoms reported      Cardiovascular Cardiovascular Symptoms Reported: Swelling in legs or feet Does patient have uncontrolled Hypertension?: No Cardiovascular Conditions: High blood cholesterol, Heart failure Cardiovascular Management Strategies: Medication therapy, Routine screening, Weight management Do You Have a Working Readable Scale?: Yes Weight: 256 lb (116.1 kg) (patient  reported)  Respiratory Respiratory Symptoms Reported: No symptoms reported Respiratory Conditions: COPD  Endocrine Patient reports the following symptoms related to hypoglycemia or hyperglycemia : No symptoms reported Is patient diabetic?: Yes Is patient checking blood sugars at home?: No Endocrine Conditions: Diabetes Endocrine Management Strategies: Routine screening  Gastrointestinal Gastrointestinal Symptoms Reported: No symptoms reported   Nutrition Risk Screen (CP): No indicators present  Genitourinary Genitourinary Symptoms Reported: No symptoms reported    Integumentary Integumentary Symptoms Reported: Wound Skin Conditions: Pressure injury Skin Comment: Reports that wound to buttock is healing  Musculoskeletal Musculoskelatal Symptoms Reviewed: Unsteady gait Musculoskeletal Conditions: Mobility limited Musculoskeletal Management Strategies: Routine screening, Medical device Falls in the past year?: Yes Number of falls in past year: 2 or more Was there an injury with Fall?: No Fall Risk Category Calculator: 2 Patient Fall Risk Level: Moderate Fall Risk Patient at Risk for Falls Due to: History of fall(s) Fall risk Follow up: Falls evaluation completed, Education provided, Falls prevention discussed  Psychosocial Psychosocial Symptoms Reported: No symptoms reported     Quality of Family Relationships: supportive Do you feel physically threatened by others?: No      12/17/2023   10:01 AM  Depression screen PHQ 2/9  Decreased Interest 0  Down, Depressed, Hopeless 0  PHQ - 2 Score 0    Vitals:   12/17/23 0938  BP: 126/70    Medications Reviewed Today     Reviewed by Remona Carmel, RN (Registered Nurse) on 12/17/23 at [phone]  Med List Status: <None>   Medication Order Taking? Sig Documenting Provider Last Dose Status Informant  apixaban  (ELIQUIS ) 5 MG TABS tablet [id number] Yes Take 1 tablet (5 mg total) by mouth  2 (two) times daily. Darlis Eisenmenger, MD Taking  Active Self  ascorbic acid  (NATURAL C/ROSE HIPS) 1000 MG tablet [id number] Yes Take 1 tablet (1,000 mg total) by mouth daily. Reuben Castilla, NP Taking Active   atorvastatin  (LIPITOR ) 80 MG tablet [id number] Yes Take 1 tablet by mouth once daily Elmyra Haggard, MD Taking Active   dapagliflozin  propanediol (FARXIGA ) 10 MG TABS tablet [id number] Yes Take 1 tablet (10 mg total) by mouth in the morning. Darlis Eisenmenger, MD Taking Active   fluticasone  (FLONASE ) 50 MCG/ACT nasal spray [id number] No Place 2 sprays into both nostrils daily.  Patient not taking: Reported on 12/17/2023   [name], Lucio Sabin, MD Not Taking Consider Medication Status and Discontinue   Multiple Vitamin (MULTIVITAMIN WITH MINERALS) TABS tablet [id number] Yes Take 1 tablet by mouth daily. [provider] Taking Active Self           Med Note Alfredo Inch   Tue Jul 02, 2023  2:30 PM)    nitroGLYCERIN  (NITROSTAT ) 0.4 MG SL tablet [id number] Yes Place 1 tablet (0.4 mg total) under the tongue every 5 (five) minutes x 3 doses as needed for chest pain. Drema Genta D, NP Taking Active   spironolactone  (ALDACTONE ) 25 MG tablet [id number] No Take 25 mg by mouth daily.  Patient not taking: Reported on 12/17/2023   [provider] Not Taking Consider Medication Status and Discontinue Self  sulfamethoxazole -trimethoprim  (BACTRIM  DS) 800-160 MG tablet [id number] No Take 1 tablet by mouth 2 (two) times daily.  Patient not taking: Reported on 12/17/2023   Chrystine Crate, FNP Not Taking Consider Medication Status and Discontinue   torsemide  (DEMADEX ) 20 MG tablet [id number] Yes Take 3 tablets (60 mg total) by mouth 2 (two) times daily. Darlis Eisenmenger, MD Taking Active             Recommendation:   PCP Follow-up Referral to: Podiatry. Patient wound benefit from podiatry due to reporting great toe pain related to a corn that he shaves off weekly. He was advised that he needed to see podiatry due to diabetes and  patient reported that he has not been diabetic in a while.  Follow Up Plan:   Telephone follow up appointment date/time:  01-14-2024 at 9:30 am  Grandville Lax, BSN RN [facility], [facility] RN Care Manager Direct Dial: [phone]  Fax: [phone]

## 2023-12-17 NOTE — Patient Instructions (Signed)
 Visit Information  Thank you for taking time to visit with me today. Please don't hesitate to contact me if I can be of assistance to you before our next scheduled appointment.  Your next care management appointment is by telephone on 01-14-2024 at 10:30 am  Telephone follow-up in 1 month  Please call the care guide team at [phone] if you need to cancel, schedule, or reschedule an appointment.   Please call the Suicide and Crisis Lifeline: 988 call the [place]  National Suicide Prevention Lifeline: (443) 319-5321 or TTY: 787-024-2248 TTY 7313699272) to talk to a trained counselor call 1-800-273-TALK (toll free, 24 hour hotline) call the [facility]: (820)494-4438 call 911 if you are experiencing a Mental Health or Behavioral Health Crisis or need someone to talk to.  Grandville Lax, BSN RN [place]  [facility], [facility] RN Care Manager Direct Dial: [phone]  Fax: [phone]]  Heart Failure Action Plan A heart failure action plan helps you know what to do when you have symptoms of heart failure. Your action plan is a color-coded plan that lists the symptoms to watch for and indicates what actions to take. If you have symptoms in the green zone, you're doing well. If you have symptoms in the yellow zone, you're having problems. If you have symptoms in the red zone, you need medical care right away. Follow the plan that was created by you and your health care provider. Review your plan each time you visit your provider. Green zone These signs mean you're doing well and can continue what you're doing: You don't have new or worsening shortness of breath. You have very little swelling or no new swelling. Your weight is stable (no gain or loss). You have a normal activity level. You don't have chest pain or any other new symptoms. Yellow zone These signs and symptoms mean your condition may be getting worse and you should make some  changes: You have trouble breathing when you're active. You have swelling in your feet or legs or have discomfort in your belly. You gain 2-3 lb (0.9-1.4 kg) in 24 hours, or 5 lb (2.3 kg) in a week. This amount may be more or less depending on your condition. You get tired easily. You have trouble sleeping. You have a dry cough. If you have any of these symptoms: Contact your provider within the next day. Your provider may adjust your medicines. Red zone These signs and symptoms mean you should get medical help right away: You have trouble breathing when resting or cannot lie flat and you need to raise your head to help you breathe. You have a dry cough that's getting worse. You have swelling or pain in your feet or legs or discomfort in your belly that's getting worse. You suddenly gain more than 2-3 lb (0.9-1.4 kg) in 24 hours, or more than 5 lb (2.3 kg) in a week. This amount may be more or less depending on your condition. You have trouble staying awake or you feel confused. You don't have an appetite. You have worsening sadness or depression. These symptoms may be an emergency. Call 911 right away. Do not wait to see if the symptoms will go away. Do not drive yourself to the hospital. Follow these instructions at home: Take medicines only as told. Eat a heart-healthy diet. Work with a dietitian to create an eating plan that's best for you. Weigh yourself each day. Your target weight is __________ lb (__________ kg). Call your provider if you  gain more than __________ lb (__________ kg) in 24 hours, or more than __________ lb (__________ kg) in a week. Health care provider name: _____________________________________________________ Health care provider phone number: _____________________________________________________ Where to find more information American Heart Association: heart.org This information is not intended to replace advice given to you by your health care provider.  Make sure you discuss any questions you have with your health care provider. Document Revised: 02/21/2023 Document Reviewed: 02/21/2023 Elsevier Patient Education  2024 Elsevier Inc.  Diabetes Mellitus and Foot Care Diabetes, also called diabetes mellitus, may cause problems with your feet and legs because of poor blood flow (circulation). Poor circulation may make your skin: Become thinner and drier. Break more easily. Heal more slowly. Peel and crack. You may also have nerve damage (neuropathy). This can cause decreased feeling in your legs and feet. This means that you may not notice minor injuries to your feet that could lead to more serious problems. Finding and treating problems early is the best way to prevent future foot problems. How to care for your feet Foot hygiene  Wash your feet daily with warm water  and mild soap. Do not use hot water . Then, pat your feet and the areas between your toes until they are fully dry. Do not soak your feet. This can dry your skin. Trim your toenails straight across. Do not dig under them or around the cuticle. File the edges of your nails with an emery board or nail file. Apply a moisturizing lotion or petroleum jelly to the skin on your feet and to dry, brittle toenails. Use lotion that does not contain alcohol and is unscented. Do not apply lotion between your toes. Shoes and socks Wear clean socks or stockings every day. Make sure they are not too tight. Do not wear knee-high stockings. These may decrease blood flow to your legs. Wear shoes that fit well and have enough cushioning. Always look in your shoes before you put them on to be sure there are no objects inside. To break in new shoes, wear them for just a few hours a day. This prevents injuries on your feet. Wounds, scrapes, corns, and calluses  Check your feet daily for blisters, cuts, bruises, sores, and redness. If you cannot see the bottom of your feet, use a mirror or ask someone for  help. Do not cut off corns or calluses or try to remove them with medicine. If you find a minor scrape, cut, or break in the skin on your feet, keep it and the skin around it clean and dry. You may clean these areas with mild soap and water . Do not clean the area with peroxide, alcohol, or iodine . If you have a wound, scrape, corn, or callus on your foot, look at it several times a day to make sure it is healing and not infected. Check for: Redness, swelling, or pain. Fluid or blood. Warmth. Pus or a bad smell. General tips Do not cross your legs. This may decrease blood flow to your feet. Do not use heating pads or hot water  bottles on your feet. They may burn your skin. If you have lost feeling in your feet or legs, you may not know this is happening until it is too late. Protect your feet from hot and cold by wearing shoes, such as at the beach or on hot pavement. Schedule a complete foot exam at least once a year or more often if you have foot problems. Report any cuts, sores, or bruises to  your health care provider right away. Where to find more information American Diabetes Association: diabetes.org Association of Diabetes Care & Education Specialists: diabeteseducator.org Contact a health care provider if: You have a condition that increases your risk of infection, and you have any cuts, sores, or bruises on your feet. You have an injury that is not healing. You have redness on your legs or feet. You feel burning or tingling in your legs or feet. You have pain or cramps in your legs and feet. Your legs or feet are numb. Your feet always feel cold. You have pain around any toenails. Get help right away if: You have a wound, scrape, corn, or callus on your foot and: You have signs of infection. You have a fever. You have a red line going up your leg. This information is not intended to replace advice given to you by your health care provider. Make sure you discuss any questions you  have with your health care provider. Document Revised: 01/10/2022 Document Reviewed: 01/10/2022 Elsevier Patient Education  2024 [employer].

## 2023-12-19 ENCOUNTER — Ambulatory Visit: Admitting: Family Medicine

## 2023-12-23 LAB — COLOGUARD: COLOGUARD: POSITIVE — AB

## 2024-01-08 ENCOUNTER — Other Ambulatory Visit (HOSPITAL_COMMUNITY): Payer: Self-pay | Admitting: Cardiology

## 2024-01-14 ENCOUNTER — Encounter: Payer: Self-pay | Admitting: *Deleted

## 2024-01-14 ENCOUNTER — Telehealth: Payer: Self-pay | Admitting: *Deleted

## 2024-01-26 ENCOUNTER — Other Ambulatory Visit: Payer: Self-pay | Admitting: Family

## 2024-01-28 ENCOUNTER — Other Ambulatory Visit: Payer: Self-pay | Admitting: Family

## 2024-01-28 MED ORDER — ASCORBIC ACID 1000 MG PO TABS: 1000.0000 mg | ORAL_TABLET | Freq: Every day | ORAL | 0 refills | Status: DC

## 2024-02-04 ENCOUNTER — Encounter: Payer: Self-pay | Admitting: Family Medicine

## 2024-03-18 ENCOUNTER — Ambulatory Visit: Payer: Self-pay

## 2024-03-18 NOTE — Telephone Encounter (Signed)
 Pt going to [place]

## 2024-03-18 NOTE — Telephone Encounter (Signed)
 FYI Only or Action Required?: FYI only for provider.  Patient was last seen in primary care on 12/09/2023 by [name], Fonda LABOR, MD.  Called Nurse Triage reporting [facility].  Symptoms began several days ago.  Interventions attempted: Nothing.  Symptoms are: gradually worsening.  Triage Disposition: See HCP Within 4 Hours (Or PCP Triage)  Patient/caregiver understands and will follow disposition?: Yes      Copied from CRM [id number]. Topic: Clinical - Red Word Triage >> Mar 18, 2024  3:49 PM Ryan Garner wrote: Red Word that prompted transfer to Nurse Triage: poison ivy and would like a shot, he is itching. Reason for Disposition  [1] Severe poison ivy, oak, or sumac reaction in the past AND [2] face or genitals involved  Answer Assessment - Initial Assessment Questions 1. APPEARANCE of RASH: What does the rash look like?      Little bumps, red and wheezping 2. LOCATION: Where is the rash located?  (e.g., face, genitals, hands, legs)     Left leg 3. SIZE: How large is the rash?      Large area 4. ONSET: When did the rash begin?      Several days ago  5. ITCHING: Does the rash itch? If Yes, ask: How bad is it?     Yes, mild 6. EXPOSURE:  How were you exposed to the plant (poison ivy, poison oak, sumac)  When were you exposed?  Note: Sometimes a poison ivy/oak/sumac rash does not appear for 2 to 3 weeks after exposure.      Poison ivy 7. PAST HISTORY: Have you had a poison ivy rash before? If Yes, ask: How bad was it?     yes 8. OTHER SYMPTOMS: Do you have any other symptoms? (e.g., fever)      no 9. PREGNANCY: Is there any chance you are pregnant? When was your last menstrual period?     na  Protocols used: Poison Ivy - Oak - [facility]

## 2024-03-19 DIAGNOSIS — L237 Allergic contact dermatitis due to plants, except food: Secondary | ICD-10-CM | POA: Diagnosis not present

## 2024-03-27 NOTE — Progress Notes (Signed)
 Hi,  No Reschedule needed pt has appt scheduled for 04/03/24 with RN.  Thank you, Shereen Gin [facility] Assistant Direct Dial: [phone]  Fax: [phone] Website: [email or web address]

## 2024-03-31 ENCOUNTER — Ambulatory Visit

## 2024-03-31 ENCOUNTER — Encounter: Payer: Self-pay | Admitting: Family

## 2024-03-31 VITALS — BP 116/66 | HR 86 | Temp 97.3°F | Ht 67.0 in | Wt 268.4 lb

## 2024-03-31 DIAGNOSIS — L255 Unspecified contact dermatitis due to plants, except food: Secondary | ICD-10-CM

## 2024-03-31 MED ORDER — TRIAMCINOLONE ACETONIDE 0.5 % EX OINT: 1.0000 | TOPICAL_OINTMENT | Freq: Two times a day (BID) | CUTANEOUS | 0 refills | Status: DC

## 2024-03-31 MED ORDER — PREDNISONE 20 MG PO TABS: 40.0000 mg | ORAL_TABLET | Freq: Every day | ORAL | 0 refills | Status: AC

## 2024-03-31 MED ORDER — METHYLPREDNISOLONE ACETATE 80 MG/ML IJ SUSP
80.0000 mg | Freq: Once | INTRAMUSCULAR | Status: AC
  Administered 2024-03-31: 80 mg via INTRAMUSCULAR

## 2024-03-31 NOTE — Patient Instructions (Signed)
Poison Ivy Dermatitis Poison ivy dermatitis is irritation and swelling (inflammation) of the skin caused by chemicals in the leaves of the poison ivy plant. The skin reaction often involves redness, blisters, and extreme itching. What are the causes? This condition is caused by a chemical (urushiol) found in the sap of the poison ivy plant. This chemical is sticky and can easily spread to people, animals, and objects. You can get poison ivy dermatitis by: Having direct contact with a poison ivy plant. Touching animals, other people, or objects that have come in contact with poison ivy and have the chemical on them. What increases the risk? This condition is more likely to develop in people who: Are outdoors often in wooded or marshy areas. Go outdoors without wearing protective clothing, such as closed shoes, long pants, and a long-sleeved shirt. What are the signs or symptoms? Symptoms of this condition include: Redness of the skin. Extreme itching. A rash that often includes bumps and blisters. The rash usually appears 48 hours after exposure, if you have been exposed before. If this is the first time you have been exposed, the rash may not appear until a week after exposure. Swelling. This may occur if the reaction is more severe. Symptoms usually last for 1-2 weeks. However, the first time you develop this condition, symptoms may last 3-4 weeks. How is this diagnosed? This condition may be diagnosed based on your symptoms and a physical exam. Your health care provider may also ask you about any recent outdoor activity. How is this treated? Treatment for this condition will vary depending on how severe it is. Treatment may include: Hydrocortisone cream or calamine lotion to relieve itching. Oatmeal baths to soothe the skin. Medicines, such as over-the-counter antihistamine tablets. Oral or injected steroid medicine, for more severe reactions. Follow these instructions at  home: Medicines Take or apply over-the-counter and prescription medicines only as told by your health care provider. Use hydrocortisone cream or calamine lotion as needed to soothe the skin and relieve itching. General instructions Do not scratch or rub your skin. Apply a cold, wet cloth (cold compress) to the affected areas or take baths in cool water. This will help with itching. Avoid hot baths and showers. Take oatmeal baths as needed. Use colloidal oatmeal. You can get this at your local pharmacy or grocery store. Follow the instructions on the packaging. Wash all clothes, bedsheets, towels, and blankets you were in contact with between your exposure and appearance of the rash. Check the affected area every day for signs of infection. Check for: More redness, swelling, or pain. Fluid or blood. Warmth. Pus or a bad smell. Keep all follow-up visits. Your health care provider may want to see how your skin is progressing with treatment. How is this prevented?  Learn to identify the poison ivy plant and avoid contact with the plant. This plant can be recognized by the number of leaves. Generally, poison ivy has three leaves with flowering branches on a single stem. The leaves are typically glossy, and they have jagged edges that come to a point. If you have been exposed to poison ivy, thoroughly wash with soap and water right away. You have about 30 minutes to remove the plant resin before it will cause the rash. Be sure to wash under your fingernails, because any plant resin there will continue to spread the rash. When hiking or camping, wear clothes that will help you to avoid skin exposure. This includes long pants, a long-sleeved shirt, long socks,   and hiking boots. You can also apply preventive lotion to your skin to help limit exposure. If you suspect that your clothes or outdoor gear came in contact with poison ivy, rinse them off outside with a garden hose before you bring them inside  your house. When doing yard work or gardening, wear gloves, long sleeves, long pants, and boots. Wash your garden tools and gloves if they come in contact with poison ivy. If you suspect that your pet has come into contact with poison ivy, wash them with pet shampoo and water. Make sure to wear gloves while washing your pet. Contact a health care provider if: You have open sores in the rash area. You have any signs of infection. You have redness that spreads beyond the rash area. You have a fever. You have a rash over a large area of your body. You have a rash on your eyes, mouth, or genitals. You have a rash that does not improve after a few weeks. Get help right away if: Your face swells or your eyes swell shut. You have trouble breathing. You have trouble swallowing. These symptoms may be an emergency. Get help right away. Call 911. Do not wait to see if the symptoms will go away. Do not drive yourself to the hospital. This information is not intended to replace advice given to you by your health care provider. Make sure you discuss any questions you have with your health care provider. Document Revised: 12/07/2021 Document Reviewed: 12/07/2021 Elsevier Patient Education  2024 Elsevier Inc.  
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
## 2024-04-03 ENCOUNTER — Encounter: Payer: Self-pay | Admitting: *Deleted

## 2024-04-03 ENCOUNTER — Telehealth: Payer: Self-pay | Admitting: *Deleted

## 2024-04-03 NOTE — Patient Instructions (Signed)
 Ryan Garner - I am sorry I was unable to reach you today for our scheduled appointment. I work with Ryan Garner, Ryan DELENA, MD and am calling to support your healthcare needs. Please contact me at [phone] at your earliest convenience. I look forward to speaking with you soon.   Thank you,  Rosina Forte, BSN RN [facility], [facility] RN Care Manager Direct Dial: [phone]  Fax: [phone]

## 2024-04-12 ENCOUNTER — Other Ambulatory Visit (HOSPITAL_COMMUNITY): Payer: Self-pay | Admitting: Cardiology

## 2024-04-13 ENCOUNTER — Encounter: Payer: Self-pay | Admitting: Nurse Practitioner

## 2024-04-13 ENCOUNTER — Ambulatory Visit: Payer: Self-pay

## 2024-04-13 ENCOUNTER — Ambulatory Visit (INDEPENDENT_AMBULATORY_CARE_PROVIDER_SITE_OTHER): Admitting: Nurse Practitioner

## 2024-04-13 VITALS — BP 127/84 | HR 77 | Temp 97.2°F | Ht 67.0 in | Wt 272.0 lb

## 2024-04-13 DIAGNOSIS — L247 Irritant contact dermatitis due to plants, except food: Secondary | ICD-10-CM | POA: Diagnosis not present

## 2024-04-13 MED ORDER — PREDNISONE 10 MG (21) PO TBPK: ORAL_TABLET | ORAL | 0 refills | Status: DC

## 2024-04-13 MED ORDER — DESOXIMETASONE 0.25 % EX CREA: 1.0000 | TOPICAL_CREAM | Freq: Two times a day (BID) | CUTANEOUS | 0 refills | Status: AC
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
## 2024-04-13 NOTE — Telephone Encounter (Signed)
 FYI Only or Action Required?: FYI only for provider.  Patient was last seen in primary care on 03/31/2024 by Lavell Bari LABOR, FNP.  Called Nurse Triage reporting Poison Ivy.  Symptoms began several weeks ago.  Interventions attempted: Nothing.  Symptoms are: gradually worsening.  Triage Disposition: See Physician Within 24 Hours  Patient/caregiver understands and will follow disposition?: Yes, will follow disposition   Copied from CRM #[id number]. Topic: Clinical - Red Word Triage >> Apr 13, 2024  8:06 AM Tiffany B wrote: Red Word that prompted transfer to Nurse Triage: Patient was seen for poison ivy and states symptoms are worsening. Patient requesting prednisone  due to experiencing itchiness. Reason for Disposition  MODERATE to SEVERE itching (e.g., interferes with work, school, sleep, or other activities)  Answer Assessment - Initial Assessment Questions 1. APPEARANCE of RASH: What does the rash look like?      Poison Ivy 2. LOCATION: Where is the rash located?  (e.g., face, genitals, hands, legs)     BUE 4. ONSET: When did the rash begin?      About a month 5. ITCHING: Does the rash itch? If Yes, ask: How bad is it?     yes 6. EXPOSURE:  How were you exposed to the plant (poison ivy, poison oak, sumac)  When were you exposed?  Note: Sometimes a poison ivy/oak/sumac rash does not appear for 2 to 3 weeks after exposure.      About a month ago 7. PAST HISTORY: Have you had a poison ivy rash before? If Yes, ask: How bad was it?     Has had it before and it looked like this 8. OTHER SYMPTOMS: Do you have any other symptoms? (e.g., fever)      denies  Protocols used: Poison Ivy - Oak - Sumac-A-AH

## 2024-04-13 NOTE — Patient Instructions (Signed)
 Contact Dermatitis Dermatitis is redness, soreness, and swelling (inflammation) of the skin. Contact dermatitis is a reaction to certain substances that touch the skin. There are two types of this condition: Irritant contact dermatitis. This is the most common type. It happens when something irritates your skin, such as when your hands get dry from washing them too often with soap. You can get this type of reaction even if you have not been exposed to the irritant before. Allergic contact dermatitis. This type is caused by a substance that you are allergic to, such as poison ivy. It occurs when you have been exposed to the substance (allergen) and form a sensitivity to it. In some cases, the reaction may start soon after your first exposure to the allergen. In other cases, it may not start until you are exposed to the allergen again. It may then occur every time you are exposed to the allergen in the future. What are the causes? Irritant contact dermatitis is often caused by exposure to: Makeup. Soaps, detergents, and bleaches. Acids. Metal salts, such as nickel. Allergic contact dermatitis is often caused by exposure to: Poisonous plants. Chemicals. Jewelry. Latex. Medicines. Preservatives in products, such as clothes. What increases the risk? You are more likely to get this condition if you have: A job that exposes you to irritants or allergens. Certain medical conditions. These include asthma and eczema. What are the signs or symptoms? Symptoms of this condition may occur in any place on your body that has been touched by the irritant. Symptoms include: Dryness, flaking, or cracking. Redness. Itching. Pain or a burning feeling. Blisters. Drainage of small amounts of blood or clear fluid from skin cracks. With allergic contact dermatitis, there may also be swelling in areas such as the eyelids, mouth, or genitals. How is this diagnosed? This condition is diagnosed with a medical  history and physical exam. A patch skin test may be done to help figure out the cause. If the condition is related to your job, you may need to see an expert in health problems in the workplace (occupational medicine specialist). How is this treated? This condition is treated by staying away from the cause of the reaction and protecting your skin from further contact. Treatment may also include: Steroid creams or ointments. Steroid medicines may need be taken by mouth (orally) in more severe cases. Antibiotics or medicines applied to the skin to kill bacteria (antibacterial ointments). These may be needed if a skin infection is present. Antihistamines. These may be taken orally or put on as a lotion to ease itching. A bandage (dressing). Follow these instructions at home: Skin care Moisturize your skin as needed. Put cool, wet cloths (cool compresses) on the affected areas. Try applying baking soda paste to your skin. Stir water into baking soda until it has the consistency of a paste. Do not scratch your skin. Avoid friction to the affected area. Avoid the use of soaps, perfumes, and dyes. Check the affected areas every day for signs of infection. Check for: More redness, swelling, or pain. More fluid or blood. Warmth. Pus or a bad smell. Medicines Take or apply over-the-counter and prescription medicines only as told by your health care provider. If you were prescribed antibiotics, take or apply them as told by your health care provider. Do not stop using the antibiotic even if you start to feel better. Bathing Try taking a bath with: Epsom salts. Follow the instructions on the packaging. You can get these at your local pharmacy  or grocery store. Baking soda. Pour a small amount into the bath as told by your health care provider. Colloidal oatmeal. Follow the instructions on the packaging. You can get this at your local pharmacy or grocery store. Bathe less often. This may mean bathing  every other day. Bathe in lukewarm water. Avoid using hot water. Bandage care If you were given a dressing, change it as told by your health care provider. Wash your hands with soap and water for at least 20 seconds before and after you change your dressing. If soap and water are not available, use hand sanitizer. General instructions Avoid the substance that caused your reaction. If you do not know what caused it, keep a journal to try to track what caused it. Write down: What you eat and drink. What cosmetics you use. What you wear in the affected area. This includes jewelry. Contact a health care provider if: Your condition does not get better with treatment. Your condition gets worse. You have any signs of infection. You have a fever. You have new symptoms. Your bone or joint under the affected area becomes painful after the skin has healed. Get help right away if: You notice red streaks coming from the affected area. The affected area turns darker. You have trouble breathing. This information is not intended to replace advice given to you by your health care provider. Make sure you discuss any questions you have with your health care provider. Document Revised: 01/12/2022 Document Reviewed: 01/12/2022 Elsevier Patient Education  2024 [employer].

## 2024-04-22 ENCOUNTER — Encounter: Payer: Self-pay | Admitting: *Deleted

## 2024-04-22 ENCOUNTER — Telehealth: Payer: Self-pay | Admitting: *Deleted

## 2024-04-22 NOTE — Patient Instructions (Signed)
 Lemond DELENA Croak - I have attempted to call you three times but have been unsuccessful in reaching you. I work with [name], Fonda DELENA, MD and am calling to support your healthcare needs. If I can be of assistance to you, please contact me at [phone].     Thank you,  Rosina Forte, BSN RN [facility], [facility] RN Care Manager Direct Dial: [phone]  Fax: [phone]

## 2024-04-29 ENCOUNTER — Telehealth (HOSPITAL_COMMUNITY): Payer: Self-pay

## 2024-04-29 NOTE — Telephone Encounter (Signed)
 Advanced Heart Failure Patient Advocate Encounter  Received notification from [employer] that patient has been conditionally approved for 2026 but needs to submit updated authorization form. Contacted patient by phone, patient consents to advocate signature for authorization.  Re-enrollment form faxed on 04/29/2024.  Rachel DEL, CPhT Rx Patient Advocate Phone: [phone]

## 2024-04-29 NOTE — Progress Notes (Unsigned)
 [facility]   MULTIDISCIPLINARY HEART VALVE CLINIC                                     Cardiology Office Note:    Date:  04/30/2024   ID:  Ryan Garner, DOB Dec 12, 1945, MRN [id number]  PCP:  [name], Fonda DELENA, MD  [facility] Cardiologist:  Vina Gull, MD  [facility] Structural heart: Ozell Fell, MD [facility] Electrophysiologist:  None   Referring MD: [name], Fonda DELENA, MD   1 year s/p TAVR  History of Present Illness:    Ryan Garner is a 78 y.o. 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   He was admitted 2/21-3/21/24 for acute HFpEF requiring dobutamine  for RV support and diuresed over 90lbs. Found to have severe functional mitral regurgitation and underwent mitral transcatheter edge-to-edge repair with PASCAL ACE (x2) in on 10/11/22. This was complicated by a rectus sheath hematoma. He did well in follow up but then developed worsening heart failure. Follow up TTE on 11/16/2022 showed EF 50-55%, stable mitral clips and possible LFLG AS. Follow up TEE 12/26/22 showed EF 45%, moderate RV dysfunction, severe AS with mean grad 27 mmHg, peak grad 41.3 mmHg, AVA 0.9 cm2, DVI  0.20, SVI 30, s/p mTEER with moderate residual MR. [facility] 02/05/23 showed moderate stenosis in the D1 and OM1 with patient LCX stent and no interventional targets as well as mildly elevated PCWP with prominent v-waves consistent with known moderate residual MR post-Mitraclips. S/p TAVR with a 29 mm Edwards Sapien 3 Ultra Resilia THV via the TF approach on 04/23/23. Echo 10/01/23 showed EF 55-60%, mod RV dysfunction, severe pulm HTN (RSVP 60.5), flattened interventricular septum c/w RV overload, severe TR, s/p Mitraclip x 2 with severe central MR, s/p TAVR with  normal function (mean gradient 8.4 mm hg). Seen by Dr. Fell and pt was not interested in further intervention on mitral valve.   Today the patient presents to clinic for follow up. No CP or SOB. Trace chronic LE edema, orthopnea or PND. No dizziness or syncope. No blood in stool or urine. No palpitations. He works out in his garden growing corn, tomatoes, and cucumers. 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 Still drives and walks around [place] with no issues.     Past Medical History:  Diagnosis Date   A-fib [facility])    Acute on chronic diastolic CHF (congestive heart failure) (HCC) 07/21/2020   Coronary artery disease    Diabetes mellitus without complication (HCC)    Hyperlipidemia    Hypertension    Metabolic syndrome    Prediabetes    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 Fell and Dr. 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 Verlin & Dr. Lucas   Sleep apnea    STEMI (ST elevation myocardial infarction) [facility])      Current Medications: Current Meds  Medication Sig   apixaban  (ELIQUIS ) 2.5 MG TABS tablet Take 1 tablet (2.5 mg total)  by mouth 2 (two) times daily.   ascorbic acid  (NATURAL C/ROSE HIPS) 1000 MG tablet Take 1 tablet (1,000 mg total) by mouth daily.   atorvastatin  (LIPITOR ) 80 MG tablet Take 1 tablet by mouth once daily   dapagliflozin  propanediol (FARXIGA ) 10 MG TABS tablet Take 1 tablet (10 mg total) by mouth in the morning.   desoximetasone  (TOPICORT ) 0.25 % cream Apply 1 Application topically 2 (two) times daily.   Multiple Vitamin (MULTIVITAMIN WITH MINERALS) TABS tablet Take 1 tablet by mouth daily.   nitroGLYCERIN  (NITROSTAT ) 0.4 MG SL tablet Place 1 tablet (0.4 mg total) under the tongue every 5 (five) minutes x 3 doses as needed for chest pain.   torsemide  (DEMADEX ) 20 MG tablet Take 3 tablets (60 mg  total) by mouth 2 (two) times daily.   triamcinolone  ointment (KENALOG ) 0.5 % Apply 1 Application topically 2 (two) times daily.   [DISCONTINUED] apixaban  (ELIQUIS ) 5 MG TABS tablet Take 1 tablet (5 mg total) by mouth 2 (two) times daily.      ROS:   Please see the history of present illness.    All other systems reviewed and are negative.  EKGs       Risk Assessment/Calculations:    CHA2DS2-VASc Score =     This indicates a  % annual risk of stroke. The patient's score is based upon:            Physical Exam:    VS:  BP 122/68   Pulse 70   Ht 5' 8 (1.727 m)   Wt 275 lb 9.6 oz (125 kg)   SpO2 99%   BMI 41.90 kg/m     Wt Readings from Last 3 Encounters:  04/30/24 275 lb 9.6 oz (125 kg)  04/13/24 272 lb (123.4 kg)  03/31/24 268 lb 6.4 oz (121.7 kg)     GEN: Well nourished, well developed in no acute distress obese  NECK: No JVD CARDIAC:irreg irreg, no murmurs,, rubs, gallops RESPIRATORY:  Clear to auscultation without rales, wheezing or rhonchi  ABDOMEN: Soft, non-tender, non-distended EXTREMITIES:  Trace  LE edema bilaterally   ASSESSMENT:    1. S/P TAVR (transcatheter aortic valve replacement)   2. S/P mitral valve clip implantation   3. Heart failure with preserved ejection fraction (HFpEF), unspecified HF chronicity (HCC)   4. Coronary artery disease involving native coronary artery of native heart without angina pectoris   5. Persistent atrial fibrillation (HCC)   6. Stage 3b chronic kidney disease (HCC)   7. Morbid obesity (HCC)     PLAN:    In order of problems listed above:  Severe AS s/p TAVR:  -- Echo today shows EF 55%, interventricular septum is flattened in systole and diastole, consistent with RV pressure and volume overload, mild RV dysfunction, RVSP 61 mmHg, severe biatrial enlargement, mild dilation of aorta, mild-mod MR/TR, normally functioning TAVR with a mean gradient of 7 mm hg and no PVL.  -- NYHA class I symptoms. -- Continue  Eliquis  but decrease dose due to age >23 and creat >1.5. -- SBE prophylaxis discussed; I have RX'd amoxicillin .  -- Continue regular follow up with Dr. Wonda (pt wants to switch care).  Severe MR s/p mTEER: -- s/p Mitraclip x 2 with severe central MR with mild to moderate MR.    HFpEF:  -- Appears euvolemic. -- Continue torsemide  60 mg bid.  -- Continue spironolactone  25 mg daily.  -- Continue Farxiga  10 mg daily. No GU symptoms. -- Said BMET recently  checked at nephrologist and stable.    CAD:  -- [facility] 02/05/23 showed moderate stenosis in the D1 and OM1 with patient LCX stent and no interventional targets.  -- Continue medical therapy.  -- Continue atorvastatin  80mg  daily.  -- No aspirin  given OAC with OAC.    Persistent atrial fibrillation:  -- Rate well controlled without any AV nodal blocking agents -- Continue Eliquis  but decrease dose 5mg  --> 2.5 due to age >70 and creat >1.5.   CKD stage IIIb:  -- Baseline Cr appears to be in the 1.5-2.0 range.  -- Creat 1.85 on labs 2/12 (personally reviewed). More recently check by nephrologist (can't remember name).    Morbid obesity:  -- Body mass index is 41.13 kg/m. -- Could consider GLP-1 agonist with concomitant diabetes.     Medication Adjustments/Labs and Tests Ordered: Current medicines are reviewed at length with the patient today.  Concerns regarding medicines are outlined above.  No orders of the defined types were placed in this encounter.  Meds ordered this encounter  Medications   apixaban  (ELIQUIS ) 2.5 MG TABS tablet    Sig: Take 1 tablet (2.5 mg total) by mouth 2 (two) times daily.    Dispense:  90 tablet    Refill:  3    Supervising Provider:   WONDA SHARPER [[id number]]    Patient Instructions  Medication Instructions:  Your physician has recommended you make the following change in your medication: DECREASE Eliquis  to 2.5mg  twice daily. New Rx sent to pharmacy.   *If you need a refill on your cardiac  medications before your next appointment, please call your pharmacy*  Lab Work: Noen needed If you have labs (blood work) drawn today and your tests are completely normal, you will receive your results only by: MyChart Message (if you have MyChart) OR A paper copy in the mail If you have any lab test that is abnormal or we need to change your treatment, we will call you to review the results.  Testing/Procedures: None needed  Follow-Up: At [facility], you and your health needs are our priority.  As part of our continuing mission to provide you with exceptional heart care, our providers are all part of one team.  This team includes your primary Cardiologist (physician) and Advanced Practice Providers or APPs (Physician Assistants and Nurse Practitioners) who all work together to provide you with the care you need, when you need it.  Your next appointment:   6 month(s)  Provider:   SHARPER WONDA, MD    We recommend signing up for the patient portal called MyChart.  Sign up information is provided on this After Visit Summary.  MyChart is used to connect with patients for Virtual Visits (Telemedicine).  Patients are able to view lab/test results, encounter notes, upcoming appointments, etc.  Non-urgent messages can be sent to your provider as well.   To learn more about what you can do with MyChart, go to [email or web address].            Signed, Lamarr Hummer, PA-C  04/30/2024 5:42 PM    [facility]

## 2024-04-30 ENCOUNTER — Other Ambulatory Visit (HOSPITAL_COMMUNITY): Payer: Self-pay | Admitting: Physician Assistant

## 2024-04-30 ENCOUNTER — Ambulatory Visit (HOSPITAL_COMMUNITY)
Admission: RE | Admit: 2024-04-30 | Discharge: 2024-04-30 | Disposition: A | Discharge status: Home / Self Care | Source: Ambulatory Visit | Attending: Cardiology | Admitting: Cardiology

## 2024-04-30 ENCOUNTER — Ambulatory Visit: Payer: Self-pay | Admitting: Physician Assistant

## 2024-04-30 ENCOUNTER — Ambulatory Visit: Admitting: Physician Assistant

## 2024-04-30 VITALS — BP 122/68 | HR 70 | Ht 68.0 in | Wt 275.6 lb

## 2024-04-30 DIAGNOSIS — Z952 Presence of prosthetic heart valve: Secondary | ICD-10-CM

## 2024-04-30 DIAGNOSIS — I2510 Atherosclerotic heart disease of native coronary artery without angina pectoris: Secondary | ICD-10-CM | POA: Insufficient documentation

## 2024-04-30 DIAGNOSIS — Z9889 Other specified postprocedural states: Secondary | ICD-10-CM | POA: Insufficient documentation

## 2024-04-30 DIAGNOSIS — I4819 Other persistent atrial fibrillation: Secondary | ICD-10-CM | POA: Diagnosis not present

## 2024-04-30 DIAGNOSIS — N1832 Chronic kidney disease, stage 3b: Secondary | ICD-10-CM | POA: Insufficient documentation

## 2024-04-30 DIAGNOSIS — I5030 Unspecified diastolic (congestive) heart failure: Secondary | ICD-10-CM | POA: Diagnosis not present

## 2024-04-30 DIAGNOSIS — Z95818 Presence of other cardiac implants and grafts: Secondary | ICD-10-CM | POA: Insufficient documentation

## 2024-04-30 LAB — ECHOCARDIOGRAM COMPLETE
AR max vel: 1.82 cm2
AV Area VTI: 2.16 cm2
AV Area mean vel: 1.86 cm2
AV Mean grad: 7 mmHg
AV Peak grad: 15.1 mmHg
Ao pk vel: 1.95 m/s
Area-P 1/2: 3.77 cm2
MV M vel: 5.35 m/s
MV Peak grad: 114.5 mmHg
MV VTI: 1.46 cm2
S' Lateral: 3.2 cm

## 2024-04-30 MED ORDER — APIXABAN 2.5 MG PO TABS: 2.5000 mg | ORAL_TABLET | Freq: Two times a day (BID) | ORAL | 3 refills | Status: DC

## 2024-04-30 NOTE — Patient Instructions (Signed)
 Medication Instructions:  Your physician has recommended you make the following change in your medication: DECREASE Eliquis  to 2.5mg  twice daily. New Rx sent to pharmacy.   *If you need a refill on your cardiac medications before your next appointment, please call your pharmacy*  Lab Work: Noen needed If you have labs (blood work) drawn today and your tests are completely normal, you will receive your results only by: MyChart Message (if you have MyChart) OR A paper copy in the mail If you have any lab test that is abnormal or we need to change your treatment, we will call you to review the results.  Testing/Procedures: None needed  Follow-Up: At [facility], you and your health needs are our priority.  As part of our continuing mission to provide you with exceptional heart care, our providers are all part of one team.  This team includes your primary Cardiologist (physician) and Advanced Practice Providers or APPs (Physician Assistants and Nurse Practitioners) who all work together to provide you with the care you need, when you need it.  Your next appointment:   6 month(s)  Provider:   Ozell Fell, MD    We recommend signing up for the patient portal called MyChart.  Sign up information is provided on this After Visit Summary.  MyChart is used to connect with patients for Virtual Visits (Telemedicine).  Patients are able to view lab/test results, encounter notes, upcoming appointments, etc.  Non-urgent messages can be sent to your provider as well.   To learn more about what you can do with MyChart, go to [email or web address].

## 2024-05-01 NOTE — Telephone Encounter (Signed)
 Patient was approved to receive Farxiga  from [place] Effective 07/23/2024 to 07/22/2025

## 2024-05-07 ENCOUNTER — Ambulatory Visit

## 2024-05-10 ENCOUNTER — Other Ambulatory Visit: Payer: Self-pay | Admitting: Family

## 2024-05-12 ENCOUNTER — Other Ambulatory Visit: Payer: Self-pay | Admitting: Family

## 2024-05-12 MED ORDER — ASCORBIC ACID 1000 MG PO TABS: 1000.0000 mg | ORAL_TABLET | Freq: Every day | ORAL | 0 refills | Status: AC

## 2024-05-13 ENCOUNTER — Telehealth (HOSPITAL_COMMUNITY): Payer: Self-pay | Admitting: Cardiology

## 2024-05-25 ENCOUNTER — Encounter: Payer: Self-pay | Admitting: Radiology

## 2024-06-02 ENCOUNTER — Ambulatory Visit: Admitting: Family Medicine

## 2024-06-02 VITALS — BP 107/61 | HR 89 | Temp 97.1°F | Ht 68.0 in | Wt 267.0 lb

## 2024-06-02 DIAGNOSIS — J449 Chronic obstructive pulmonary disease, unspecified: Secondary | ICD-10-CM | POA: Diagnosis not present

## 2024-06-02 DIAGNOSIS — J019 Acute sinusitis, unspecified: Secondary | ICD-10-CM | POA: Diagnosis not present

## 2024-06-02 DIAGNOSIS — R195 Other fecal abnormalities: Secondary | ICD-10-CM

## 2024-06-02 DIAGNOSIS — K5900 Constipation, unspecified: Secondary | ICD-10-CM | POA: Diagnosis not present

## 2024-06-02 DIAGNOSIS — B9689 Other specified bacterial agents as the cause of diseases classified elsewhere: Secondary | ICD-10-CM

## 2024-06-02 DIAGNOSIS — E1169 Type 2 diabetes mellitus with other specified complication: Secondary | ICD-10-CM | POA: Diagnosis not present

## 2024-06-02 MED ORDER — ALBUTEROL SULFATE HFA 108 (90 BASE) MCG/ACT IN AERS: 2.0000 | INHALATION_SPRAY | Freq: Four times a day (QID) | RESPIRATORY_TRACT | 0 refills | Status: AC | PRN

## 2024-06-02 MED ORDER — POLYETHYLENE GLYCOL 3350 17 GM/SCOOP PO POWD: 17.0000 g | Freq: Two times a day (BID) | ORAL | 1 refills | Status: AC | PRN

## 2024-06-02 MED ORDER — AMOXICILLIN-POT CLAVULANATE 875-125 MG PO TABS: 1.0000 | ORAL_TABLET | Freq: Two times a day (BID) | ORAL | 0 refills | Status: AC

## 2024-06-02 NOTE — Progress Notes (Signed)
 Acute Office Visit  Patient ID: CLOYCE BLANKENHORN, male    DOB: 02/20/1946, 78 y.o.   MRN: [id number]  PCP: [name], Fonda DELENA, MD  Chief Complaint  Patient presents with   SICK VISIT    Constipation, dizziness, coughing up mucus, fever (since Saturday, approx 3 days)    Subjective:     HPI  Discussed the use of AI scribe software for clinical note transcription with the patient, who gave verbal consent to proceed.  History of Present Illness   DEMITRIS POKORNY is an 78 year old male who presents with sinus congestion and constipation.  Upper respiratory symptoms - Significant sinus congestion since Saturday - Productive cough with sputum of varying colors since Saturday - No increase in cough frequency compared to baseline - No breathing difficulties or wheezing - Occasional subjective fever without measured temperature - Lack of energy since symptom onset - hx of COPD. Has not been using inhalers.   Constipation - New onset constipation beginning last Friday - Small, hard bowel movement yesterday - No changes in diet - No vomiting  - Denies blood in stool  Colorectal cancer screening - Positive Cologuard test this past May - Patient reports that he was never called by GI for appointment.       ROS     Objective:    BP 107/61   Pulse 89   Temp (!) 97.1 F (36.2 C)   Ht 5' 8 (1.727 m)   Wt 267 lb (121.1 kg)   SpO2 97%   BMI 40.60 kg/m    Physical Exam Vitals reviewed.  Constitutional:      Appearance: Normal appearance.  HENT:     Head: Normocephalic and atraumatic.     Comments: Pain with palpation of bilateral maxillary sinuses    Right Ear: Tympanic membrane, ear canal and external ear normal.     Left Ear: Tympanic membrane, ear canal and external ear normal.     Nose: Nose normal.     Mouth/Throat:     Mouth: Mucous membranes are moist.     Pharynx: Oropharynx is clear.  Eyes:     Extraocular Movements: Extraocular movements intact.      Conjunctiva/sclera: Conjunctivae normal.     Pupils: Pupils are equal, round, and reactive to light.  Cardiovascular:     Rate and Rhythm: Normal rate and regular rhythm.     Pulses: Normal pulses.     Heart sounds: Normal heart sounds. No murmur heard. Pulmonary:     Effort: Pulmonary effort is normal. No respiratory distress.     Breath sounds: Normal breath sounds. No stridor. No wheezing, rhonchi or rales.  Musculoskeletal:        General: No deformity. Normal range of motion.     Cervical back: Normal range of motion and neck supple.  Skin:    General: Skin is warm and dry.     Capillary Refill: Capillary refill takes less than 2 seconds.  Neurological:     General: No focal deficit present.     Mental Status: He is alert and oriented to person, place, and time.  Psychiatric:        Mood and Affect: Mood normal.        Behavior: Behavior normal.       No results found for any visits on 06/02/24.     Assessment & Plan:   Problem List Items Addressed This Visit       Respiratory  COPD (chronic obstructive pulmonary disease) (HCC)   Relevant Medications   albuterol  (VENTOLIN  HFA) 108 (90 Base) MCG/ACT inhaler     Endocrine   Diabetes mellitus (HCC)   Other Visit Diagnoses       Acute bacterial sinusitis    -  Primary   Relevant Medications   amoxicillin -clavulanate (AUGMENTIN ) 875-125 MG tablet     Constipation, unspecified constipation type       Relevant Medications   polyethylene glycol powder (GLYCOLAX /MIRALAX ) 17 GM/SCOOP powder     Positive colorectal cancer screening using Cologuard test           Assessment and Plan    Acute sinusitis and possible COPD exacerbation - Prescribed albuterol  inhaler - Prescribed augmentin  BID x 7 days for sinusitis (worsening symptoms and subjective fever) and possible COPD exacerbation. - Follow up if symptoms acutely worsen, no improvement over the next 2-3 days, fever develops, or for any other concerns   Acute  constipation New onset constipation. Positive Cologuard test this past May necessitates GI evaluation.  - Prescribed miralax  2 capfuls/day to aid bowel movement. - Provided patient with GI referral information so that he can call them and schedule appointment. Discussed importance of making GI appointment.  - Follow up if symptoms acutely worsen, no improvement over the next 2-3 days, fever develops, blood in stool develops, or for any other concerns        Meds ordered this encounter  Medications   amoxicillin -clavulanate (AUGMENTIN ) 875-125 MG tablet    Sig: Take 1 tablet by mouth 2 (two) times daily for 7 days.    Dispense:  14 tablet    Refill:  0    Supervising Provider:   GOTTSCHALK, ASHLY M [[id number]]   albuterol  (VENTOLIN  HFA) 108 (90 Base) MCG/ACT inhaler    Sig: Inhale 2 puffs into the lungs every 6 (six) hours as needed for wheezing or shortness of breath.    Dispense:  8 g    Refill:  0    Supervising Provider:   JOLINDA NORENE HERO [[id number]]   polyethylene glycol powder (GLYCOLAX /MIRALAX ) 17 GM/SCOOP powder    Sig: Take 17 g by mouth 2 (two) times daily as needed. Dissolve 1 capful (17g) in 4-8 ounces of liquid and take by mouth daily.    Dispense:  3350 g    Refill:  1    Supervising Provider:   JOLINDA NORENE HERO [[id number]]    Return if symptoms worsen or fail to improve.  Oneil LELON Severin, FNP [facility]

## 2024-06-02 NOTE — Patient Instructions (Signed)
 Call Gastroenterology at:   [phone] option 1       Follow up if symptoms acutely worsen, no improvement over the next 2-3 days, fever develops, or for any other concerns

## 2024-06-15 ENCOUNTER — Telehealth: Payer: Self-pay | Admitting: Pharmacy Technician

## 2024-06-15 NOTE — Telephone Encounter (Signed)
   I will call the patient in mid December to see if he can get hw grant. Bms also sent application for 2026

## 2024-06-16 DIAGNOSIS — D225 Melanocytic nevi of trunk: Secondary | ICD-10-CM | POA: Diagnosis not present

## 2024-06-16 DIAGNOSIS — L570 Actinic keratosis: Secondary | ICD-10-CM | POA: Diagnosis not present

## 2024-06-16 DIAGNOSIS — L814 Other melanin hyperpigmentation: Secondary | ICD-10-CM | POA: Diagnosis not present

## 2024-06-16 DIAGNOSIS — L821 Other seborrheic keratosis: Secondary | ICD-10-CM | POA: Diagnosis not present

## 2024-06-23 ENCOUNTER — Other Ambulatory Visit (HOSPITAL_COMMUNITY): Payer: Self-pay

## 2024-06-23 NOTE — Telephone Encounter (Signed)
 I called the patient to go over the changes of BMS for Eliquis  and he would like to see if he can be changed to dabigatran and he if can, [facility]  [address] [place], [place] [address]? Thank you   Patient only has part a/b  I called [employer]ms and they said if they have just have a/b insurance but no part d they get a credit to go towards that 3% that has to be met for 2026. if they have part d, they do not get that credit. if they have any type of medicare, they will have to of met the 3% in 2026 to get approved for 2026. if they have no insurance at all - no a/b, etc, they do not have to meet that 3%. she said she couldnt tell me the credit amount until the individual is processed. the apps for 2026 will be processed on or after 12/15. for hoh for 1 they can not exceed 46,950 a year, for hoh of 2 can not exceed 63,450 a year. she said all the applications that have been sent in for renewal for 2026 will be denied on 12/15 if they have any type of medicare b/c of that 3%. she said come 2026, all the prescriptions they get on cash price or goodrx amounts can be used towards that 3%. Eliquis  is 600 on goodrx at [facility]. dabigatran is 56.66 [facility]. xarelto 20mg  is 592 at [facility] on goodrx, only the generic xarelto is available as 2.5mg  on goodrx for 111 at [facility]. warfarin is 4.00 at [facility] and 5.[facility]  He said he makes 35,000 a year so he doesn't qualify for extra help

## 2024-06-26 ENCOUNTER — Ambulatory Visit

## 2024-06-26 ENCOUNTER — Encounter: Payer: Self-pay | Admitting: Nurse Practitioner

## 2024-06-26 ENCOUNTER — Ambulatory Visit: Admitting: Nurse Practitioner

## 2024-06-26 VITALS — BP 107/66 | HR 91 | Temp 97.1°F | Ht 68.0 in | Wt 272.0 lb

## 2024-06-26 DIAGNOSIS — S9782XA Crushing injury of left foot, initial encounter: Secondary | ICD-10-CM

## 2024-06-26 DIAGNOSIS — L02619 Cutaneous abscess of unspecified foot: Secondary | ICD-10-CM

## 2024-06-26 DIAGNOSIS — S92355A Nondisplaced fracture of fifth metatarsal bone, left foot, initial encounter for closed fracture: Secondary | ICD-10-CM

## 2024-06-26 MED ORDER — CEPHALEXIN 500 MG PO CAPS: 500.0000 mg | ORAL_CAPSULE | Freq: Four times a day (QID) | ORAL | 0 refills | Status: AC
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
## 2024-06-26 NOTE — Patient Instructions (Signed)
 Metatarsal Fracture A metatarsal fracture is a break in one of the five bones that connect the toes to the rest of the foot. This may also be called a forefoot fracture. A metatarsal fracture may be: A crack in the surface of the bone (stress fracture). This often occurs in athletes. A break all the way through the bone (complete fracture). The bone that connects to the little toe (fifth metatarsal) is most commonly fractured. Ballet dancers and other athletes often fracture this bone. What are the causes? A metatarsal fracture may be caused by: Sudden twisting of the foot. Falling onto the foot. Something heavy falling onto the foot. Overuse or repeated exercise. What increases the risk? This condition is more likely to develop in people who: Play contact sports. Are runners. Do ballet. Have weak bones (osteoporosis). Have low calcium levels. What are the signs or symptoms? Symptoms of this condition include: Pain that gets worse when walking or standing. Pain when pressing on the foot or moving the toes. Swelling. Bruising on the top or bottom of the foot. How is this diagnosed? This condition may be diagnosed based on: Your symptoms. Any recent foot injuries you have had. A physical exam. An X-ray of your foot. If you have a stress fracture, it may not show up on an X-ray. You may need other imaging tests, such as: A bone scan. CT scan. MRI. How is this treated? Treatment depends on how severe your fracture is and how the pieces of the broken bone line up with each other (alignment). Treatment may involve: Wearing a cast, splint, or supportive boot on your foot. Using crutches and notputting any weight on your foot. Having surgery to align broken bones and hold them in place while they heal (open reduction and internal fixation or percutaneous pinning). Physical therapy exercises to improve movement and strength in your foot. Follow these instructions at home: If you have a  removable splint or boot: Wear the splint or boot as told by your health care provider. Remove it only as told by your provider. Check the skin around the splint or boot every day. Tell your provider about any concerns. Loosen the splint or boot if your toes tingle, become numb, or turn cold and blue. Keep the splint or boot clean and dry. If you have a nonremovable cast: Do not put pressure on any part of the cast until it is fully hardened. This may take several hours. Do not stick anything inside the cast to scratch your skin. Doing that increases your risk of infection. Check the skin around the cast every day. Tell your provider about any concerns. You may put lotion on dry skin around the edges of the cast. Do not put lotion on the skin underneath the cast. Keep the cast clean and dry. Bathing If the cast, splint, or boot is not waterproof: Do not let it get wet. Cover it with a watertight covering when you take a bath or shower. Activity Do not use your affected leg to support your body weight until your provider says that you can. Use crutches as told by your provider. Ask your provider what activities are safe for you during recovery. Ask what activities you need to avoid. Do exercises as told by your provider. Driving Ask your provider if the medicine prescribed to you requires you to avoid driving or using machinery. Do not drive while wearing a cast, splint, or boot on a foot that you use for driving. Managing pain, stiffness,  and swelling  If told, put ice on the painful area. If you have a removable splint or boot, remove it as told by your provider. Put ice in a plastic bag. Place a towel between your skin and the bag or between your cast and the bag. Leave the ice on for 20 minutes, 2-3 times a day. If your skin turns bright red, remove the ice right away to prevent skin damage. The risk of damage is higher if you cannot feel pain, heat, or cold. Move your toes often to  reduce stiffness and swelling. Raise (elevate) your lower leg above the level of your heart while you are sitting or lying down. General instructions Take over-the-counter and prescription medicines only as told by your provider. Do not use any products that contain nicotine or tobacco. These products include cigarettes, chewing tobacco, and vaping devices, such as e-cigarettes. These can delay bone healing. If you need help quitting, ask your provider. Do not take baths, swim, or use a hot tub until your provider approves. Ask your provider if you may take showers. Keep all follow-up visits. Your provider will check to see how you are healing. This may include more X-rays. Contact a health care provider if: You have pain that gets worse or does not improve with medicine. You have a fever. You have a bad smell coming from your cast or splint or if the cast or splint gets wet. Get help right away if: You have any of the following in your toes or foot, even after loosening your splint (if you have one): Numbness. Tingling. Coldness. Blue skin. Redness or swelling that gets worse. You have pain that suddenly becomes severe. This information is not intended to replace advice given to you by your health care provider. Make sure you discuss any questions you have with your health care provider. Document Revised: 07/26/2022 Document Reviewed: 07/26/2022 Elsevier Patient Education  2024 [employer].

## 2024-06-27 NOTE — Telephone Encounter (Signed)
 I'm ok with the change. Thank you

## 2024-06-29 DIAGNOSIS — N184 Chronic kidney disease, stage 4 (severe): Secondary | ICD-10-CM | POA: Diagnosis not present

## 2024-06-29 MED ORDER — DABIGATRAN ETEXILATE MESYLATE 150 MG PO CAPS: 150.0000 mg | ORAL_CAPSULE | Freq: Two times a day (BID) | ORAL | 5 refills | Status: AC

## 2024-06-29 NOTE — Addendum Note (Signed)
 Addended by: DRENA MARTINIS, Tyshea Imel L on: 06/29/2024 07:58 AM   Modules accepted: Orders

## 2024-06-29 NOTE — Telephone Encounter (Signed)
 Wonda Sharper, MD to [facility]   06/27/24 11:04 AM Note I'm ok with the change. Thank you     Vicci Roxie CROME, RN to Wonda Sharper, MD    06/23/24  3:03 PM OK to switch from Eliquis  to Pradaxa  150 mg BID? Please advise.   Called patient to make him aware of changes. Patient agreed to changes. Will send to patient's pharmacy of choice.

## 2024-06-30 DIAGNOSIS — I350 Nonrheumatic aortic (valve) stenosis: Secondary | ICD-10-CM | POA: Diagnosis not present

## 2024-06-30 DIAGNOSIS — E871 Hypo-osmolality and hyponatremia: Secondary | ICD-10-CM | POA: Diagnosis not present

## 2024-06-30 DIAGNOSIS — E559 Vitamin D deficiency, unspecified: Secondary | ICD-10-CM | POA: Diagnosis not present

## 2024-06-30 DIAGNOSIS — N1832 Chronic kidney disease, stage 3b: Secondary | ICD-10-CM | POA: Diagnosis not present

## 2024-06-30 DIAGNOSIS — N2581 Secondary hyperparathyroidism of renal origin: Secondary | ICD-10-CM | POA: Diagnosis not present

## 2024-06-30 DIAGNOSIS — I129 Hypertensive chronic kidney disease with stage 1 through stage 4 chronic kidney disease, or unspecified chronic kidney disease: Secondary | ICD-10-CM | POA: Diagnosis not present

## 2024-06-30 DIAGNOSIS — I5032 Chronic diastolic (congestive) heart failure: Secondary | ICD-10-CM | POA: Diagnosis not present

## 2024-06-30 DIAGNOSIS — D631 Anemia in chronic kidney disease: Secondary | ICD-10-CM | POA: Diagnosis not present

## 2024-07-02 ENCOUNTER — Ambulatory Visit: Payer: Self-pay | Admitting: Nurse Practitioner

## 2024-07-10 ENCOUNTER — Encounter: Payer: Self-pay | Admitting: Nurse Practitioner

## 2024-07-10 ENCOUNTER — Ambulatory Visit: Admitting: Nurse Practitioner

## 2024-07-10 VITALS — BP 128/81 | HR 71 | Temp 98.2°F | Ht 68.0 in | Wt 276.0 lb

## 2024-07-10 DIAGNOSIS — S9782XA Crushing injury of left foot, initial encounter: Secondary | ICD-10-CM

## 2024-07-10 DIAGNOSIS — S9782XD Crushing injury of left foot, subsequent encounter: Secondary | ICD-10-CM

## 2024-07-10 MED ORDER — CIPROFLOXACIN HCL 500 MG PO TABS: 500.0000 mg | ORAL_TABLET | Freq: Two times a day (BID) | ORAL | 0 refills | Status: AC
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
## 2024-07-11 ENCOUNTER — Other Ambulatory Visit (HOSPITAL_COMMUNITY): Payer: Self-pay | Admitting: Cardiology

## 2024-07-11 ENCOUNTER — Other Ambulatory Visit: Payer: Self-pay | Admitting: Internal Medicine

## 2024-07-20 ENCOUNTER — Encounter: Payer: Self-pay | Admitting: Physician Assistant

## 2024-07-20 ENCOUNTER — Ambulatory Visit: Admitting: Physician Assistant

## 2024-07-20 VITALS — BP 120/72 | HR 69 | Ht 68.0 in | Wt 277.4 lb

## 2024-07-20 DIAGNOSIS — R195 Other fecal abnormalities: Secondary | ICD-10-CM | POA: Diagnosis not present

## 2024-07-20 DIAGNOSIS — J449 Chronic obstructive pulmonary disease, unspecified: Secondary | ICD-10-CM

## 2024-07-20 DIAGNOSIS — I359 Nonrheumatic aortic valve disorder, unspecified: Secondary | ICD-10-CM | POA: Diagnosis not present

## 2024-07-20 DIAGNOSIS — I4819 Other persistent atrial fibrillation: Secondary | ICD-10-CM

## 2024-07-20 DIAGNOSIS — Z6841 Body Mass Index (BMI) 40.0 and over, adult: Secondary | ICD-10-CM

## 2024-07-20 DIAGNOSIS — Z952 Presence of prosthetic heart valve: Secondary | ICD-10-CM | POA: Diagnosis not present

## 2024-07-20 DIAGNOSIS — I272 Pulmonary hypertension, unspecified: Secondary | ICD-10-CM

## 2024-07-20 DIAGNOSIS — G4733 Obstructive sleep apnea (adult) (pediatric): Secondary | ICD-10-CM

## 2024-07-20 DIAGNOSIS — K5730 Diverticulosis of large intestine without perforation or abscess without bleeding: Secondary | ICD-10-CM | POA: Diagnosis not present

## 2024-07-20 DIAGNOSIS — I5030 Unspecified diastolic (congestive) heart failure: Secondary | ICD-10-CM

## 2024-07-20 DIAGNOSIS — I4891 Unspecified atrial fibrillation: Secondary | ICD-10-CM | POA: Diagnosis not present

## 2024-07-20 DIAGNOSIS — I25118 Atherosclerotic heart disease of native coronary artery with other forms of angina pectoris: Secondary | ICD-10-CM

## 2024-07-20 NOTE — Patient Instructions (Addendum)
 _________________________  VISIT SUMMARY:  You came in today to discuss your positive Cologuard colorectal cancer screening test. We reviewed your options and decided on a virtual colonoscopy due to your age and existing health conditions.  YOUR PLAN:  POSITIVE COLORECTAL CANCER SCREENING: You had a positive Cologuard test, which means there might be changes in your colon that need further evaluation. -We discussed different management options including a hospital-based colonoscopy with anesthesia, a virtual colonoscopy with bowel preparation, and observation with repeat CT imaging if you are unwilling to undergo bowel preparation. -We reviewed the risks and benefits of each option, including potential complications and diagnostic limitations. -You chose to proceed with a virtual colonoscopy. -A virtual colonoscopy has been ordered for you.______________________________  If your blood pressure at your visit was 140/90 or greater, please contact your primary care physician to follow up on this.  _______________________________________________________  If you are age 80 or older, your body mass index should be between 23-30. Your Body mass index is 42.17 kg/m. If this is out of the aforementioned range listed, please consider follow up with your Primary Care Provider.  If you are age 97 or younger, your body mass index should be between 19-25. Your Body mass index is 42.17 kg/m. If this is out of the aformentioned range listed, please consider follow up with your Primary Care Provider.   ________________________________________________________  The [facility] providers would like to encourage you to use MYCHART to communicate with providers for non-urgent requests or questions.  Due to long hold times on the telephone, sending your provider a message by [facility] may be a faster and more efficient way to get a response.  Please allow 48 business hours for a response.  Please remember that this  is for non-urgent requests.  _______________________________________________________  [facility] is using a team-based approach to care.  Your team is made up of your doctor and two to three APPS. Our APPS (Nurse Practitioners and Physician Assistants) work with your physician to ensure care continuity for you. They are fully qualified to address your health concerns and develop a treatment plan. They communicate directly with your gastroenterologist to care for you. Seeing the Advanced Practice Practitioners on your physician's team can help you by facilitating care more promptly, often allowing for earlier appointments, access to diagnostic testing, procedures, and other specialty referrals.   Virtual colonoscopy for 08-14-24 at 10am. Arrival 940am. Pick up prep 4 days prior (on 08-10-24) and you will receive instructions. Make sure to bring insurance card with you for them to scan in. Address is [address] [place] and phone number is [phone]. If you need to reschedule please call them to reschedule at least 2 days prior to avoid a 100 dollar cancellation fee.  It was a pleasure to see you today!  Thank you for trusting me with your gastrointestinal care!

## 2024-07-20 NOTE — Progress Notes (Signed)
 "    07/20/2024 Ryan Garner [id number] 07/05/1946  Referring provider: [name], Fonda DELENA, MD Primary GI doctor: Dr. San  ASSESSMENT AND PLAN:  Positive cologuard 11/2023 Last colonoscopy 30 years ago in [place], denies polyps No family history of colon cancer No blood in stool, no changes in bowel habits Asymptomatic with elevated procedural risk due to age and cardiac/pulmonary comorbidities. Discussed management options and risks, including procedural complications and limitations of virtual colonoscopy. Chose virtual colonoscopy after shared decision making. - Discussed management options: hospital-based colonoscopy with anesthesia and cardiac clearance but patient is very high risk, virtual colonoscopy with bowel preparation but would not be able to see polyps below 5 mm, versus observation with repeat CT AB and pelvis if unwilling to undergo bowel preparation with knowledge that we could miss a colon cancer. - Reviewed risks and benefits of each option, including procedural complications and diagnostic limitations. - Ordered virtual colonoscopy after shared decision making.  Diverticulosis seen on CT 2024 Will call if any symptoms. Add on fiber supplement, avoid NSAIDS, information given  History of aortic stenosis status post TAVR 2024 04/30/2024 ejection fraction 55% consistent with RV pressure and volume overload mild RV dysfunction RVSP 61 severe bilateral enlargement mild dilatation of aorta mild to moderate MR/TR normal functioning TAVR 01/23/2023 cardiac catheterization nonobstructing plaque  Pulmonary HTN On CPAP, not on oxygen 04/30/2024 ejection fraction 55% consistent with RV pressure and volume overload mild RV dysfunction RVSP 61 severe bilateral enlargement mild dilatation of aorta mild to moderate MR/TR normal functioning TAVR  Afib  On pradaxa   Morbid obesity  Body mass index is 42.17 kg/m.  -Patient has been advised to make an attempt to improve  diet and exercise patterns to aid in weight loss. -Recommended diet heavy in fruits and veggies and low in animal meats, cheeses, and dairy products, appropriate calorie intake   Patient Care Team: [name], Fonda DELENA, MD as PCP - General (Family Medicine) Okey Vina GAILS, MD as PCP - Cardiology (Cardiology) Wonda Sharper, MD as PCP - Structural Heart (Cardiology)  HISTORY OF PRESENT ILLNESS: 78 y.o. male with a past medical history listed below presents for evaluation of positive cologuard.   Discussed the use of AI scribe software for clinical note transcription with the patient, who gave verbal consent to proceed.  History of Present Illness   Ryan Garner is an 78 year old male with diverticulosis who presents for evaluation of a positive Cologuard colorectal cancer screening test.  Positive Cologuard stool test in May 2025. He is asymptomatic, with no gastrointestinal complaints including diarrhea, constipation, rectal bleeding, blood in the stool, or changes in bowel habits. Regular daily bowel movements. No prior history of polyps or colon cancer. Last colonoscopy approximately 30 years ago in [place], with no polyps removed. No family history of colon cancer. He had a CT abdomen and pelvis with and without contrast in November 2024; the patient recalls no significant findings were reported to him.  Medical history includes TAVR for aortic valve disease last year, atrial fibrillation on Pradaxa , pulmonary hypertension, paralyzed vocal cord affecting voice, sleep apnea managed with CPAP, and remote cholecystectomy. No current blood pressure medication due to difficulty with regulation. Sustained a foot fracture four weeks ago after trauma. Chronic shortness of breath for several years without acute worsening and some leg swelling. Denies chest pain. He does not report a history of anemia, iron  deficiency, or use of iron  supplementation.      He  reports that he quit  smoking about 65  years ago. His smoking use included cigarettes. He has never used smokeless tobacco. He reports that he does not drink alcohol and does not use drugs.  RELEVANT GI HISTORY, IMAGING AND LABS: Results   Labs Cologuard (11/2023): Positive  Radiology CT abdomen and pelvis with and without contrast (05/2023): Bowel grossly unremarkable, no lymphadenopathy, no colonic mass  Diagnostic Echocardiogram: Aortic valve functioning well, mild valvular regurgitation      CBC    Component Value Date/Time   WBC 8.8 12/09/2023 1314   WBC 8.8 04/24/2023 0347   RBC 4.69 12/09/2023 1314   RBC 3.94 (L) 04/24/2023 0347   HGB 14.3 12/09/2023 1314   HCT 42.9 12/09/2023 1314   PLT 305 12/09/2023 1314   MCV 92 12/09/2023 1314   MCH 30.5 12/09/2023 1314   MCH 29.4 04/24/2023 0347   MCHC 33.3 12/09/2023 1314   MCHC 32.2 04/24/2023 0347   RDW 12.1 12/09/2023 1314   LYMPHSABS 1.0 12/09/2023 1314   MONOABS 0.9 09/12/2022 2101   EOSABS 0.1 12/09/2023 1314   BASOSABS 0.0 12/09/2023 1314   Recent Labs    09/11/23 1137 12/09/23 1314  HGB 14.4 14.3    CMP     Component Value Date/Time   NA 133 (L) 09/04/2023 1459   NA 137 05/06/2023 0926   K 5.0 09/04/2023 1459   CL 97 (L) 09/04/2023 1459   CO2 24 09/04/2023 1459   GLUCOSE 130 (H) 09/04/2023 1459   BUN 51 (H) 09/04/2023 1459   BUN 56 (H) 05/06/2023 0926   CREATININE 1.85 (H) 09/04/2023 1459   CALCIUM  9.5 09/04/2023 1459   PROT 8.1 04/19/2023 0935   PROT 6.7 09/11/2022 1530   ALBUMIN  4.0 04/19/2023 0935   ALBUMIN  3.6 (L) 09/11/2022 1530   AST 28 04/19/2023 0935   ALT 24 04/19/2023 0935   ALKPHOS 97 04/19/2023 0935   BILITOT 1.1 04/19/2023 0935   BILITOT 0.6 09/11/2022 1530   GFRNONAA 37 (L) 09/04/2023 1459   GFRAA 80 09/01/2020 1110      Latest Ref Rng & Units 04/19/2023    9:35 AM 09/20/2022   10:28 AM 09/12/2022    9:01 PM  Hepatic Function  Total Protein 6.5 - 8.1 g/dL 8.1  6.3  6.8   Albumin  3.5 - 5.0 g/dL 4.0  2.6  3.1   AST  15 - 41 U/L 28  21  19    ALT 0 - 44 U/L 24  13  13    Alk Phosphatase 38 - 126 U/L 97  54  69   Total Bilirubin 0.3 - 1.2 mg/dL 1.1  1.9  0.9   Bilirubin, Direct 0.0 - 0.2 mg/dL  0.5        Current Medications:   Current Outpatient Medications (Endocrine & Metabolic):    dapagliflozin  propanediol (FARXIGA ) 10 MG TABS tablet, Take 1 tablet (10 mg total) by mouth in the morning.  Current Outpatient Medications (Cardiovascular):    atorvastatin  (LIPITOR ) 80 MG tablet, Take 1 tablet by mouth once daily   nitroGLYCERIN  (NITROSTAT ) 0.4 MG SL tablet, Place 1 tablet (0.4 mg total) under the tongue every 5 (five) minutes x 3 doses as needed for chest pain.   torsemide  (DEMADEX ) 20 MG tablet, Take 3 tablets (60 mg total) by mouth 2 (two) times daily.  Current Outpatient Medications (Respiratory):    albuterol  (VENTOLIN  HFA) 108 (90 Base) MCG/ACT inhaler, Inhale 2 puffs into the lungs every 6 (six) hours as needed for wheezing or  shortness of breath.  Current Outpatient Medications (Hematological):    dabigatran  (PRADAXA ) 150 MG CAPS capsule, Take 1 capsule (150 mg total) by mouth 2 (two) times daily.  Current Outpatient Medications (Other):    ascorbic acid  (NATURAL C/ROSE HIPS) 1000 MG tablet, Take 1 tablet (1,000 mg total) by mouth daily.   ciprofloxacin  (CIPRO ) 500 MG tablet, Take 1 tablet (500 mg total) by mouth 2 (two) times daily for 10 days.   desoximetasone  (TOPICORT ) 0.25 % cream, Apply 1 Application topically 2 (two) times daily.   Multiple Vitamin (MULTIVITAMIN WITH MINERALS) TABS tablet, Take 1 tablet by mouth daily.   polyethylene glycol powder (GLYCOLAX /MIRALAX ) 17 GM/SCOOP powder, Take 17 g by mouth 2 (two) times daily as needed. Dissolve 1 capful (17g) in 4-8 ounces of liquid and take by mouth daily.   SV VITAMIN D3 50 MCG (2000 UT) CAPS, Take 1 capsule by mouth daily.  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 Wonda and Dr. 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 Verlin & Dr. Lucas   Sleep apnea    STEMI (ST elevation myocardial infarction) [facility])    Allergies: Allergies[1]   Surgical History:  He  has a past surgical history that includes Hernia repair; Tonsillectomy; Rotator cuff repair (Bilateral); Eyelid Surgery (Bilateral); knee tendons repair; Appendectomy; Cholecystectomy (N/A, 03/02/2020); Coronary/Graft Acute MI Revascularization (N/A, 04/30/2020); LEFT HEART CATH AND CORONARY ANGIOGRAPHY (N/A, 04/30/2020); CORONARY STENT INTERVENTION (N/A, 04/30/2020); I & D extremity (Left, 04/06/2022); I & D extremity (Left, 04/04/2022); RIGHT HEART CATH (N/A, 10/03/2022); TEE without cardioversion (N/A, 10/02/2022); Bubble study (10/02/2022); TRANSCATHETER MITRAL EDGE TO EDGE REPAIR (N/A, 10/10/2022); TEE without cardioversion (N/A, 10/10/2022); TEE without cardioversion (N/A, 12/26/2022); RIGHT HEART CATH (N/A, 12/26/2022); RIGHT/LEFT HEART CATH AND CORONARY ANGIOGRAPHY (N/A, 02/05/2023); Transcatheter aortic valve replacement, transfemoral (N/A, 04/23/2023); and Intraoperative transthoracic echocardiogram (N/A, 04/23/2023). Family History:  His family history includes Arthritis in his father; Cancer in his mother; Coronary artery disease in his mother; Diabetes in his brother and maternal grandmother; Heart attack in his brother.  REVIEW OF SYSTEMS  : All other systems reviewed and negative except where noted in the History of Present Illness.  PHYSICAL EXAM: BP 120/72   Pulse 69   Ht 5' 8 (1.727 m)   Wt 277 lb 6 oz  (125.8 kg)   BMI 42.17 kg/m  Physical Exam   GENERAL APPEARANCE: Well nourished, in no apparent distress. HEENT: No cervical lymphadenopathy, unremarkable thyroid , sclerae anicteric, conjunctiva pink. RESPIRATORY: Respiratory effort normal, breath sounds equal bilaterally without rales, rhonchi, wheezing. Lungs clear to auscultation bilaterally. CARDIO: Regular rate and rhythm with no murmurs, rubs, or gallops, peripheral pulses intact. ABDOMEN: Soft, non-distended, active bowel sounds in all four quadrants, no tenderness to palpation, no rebound, no mass appreciated. RECTAL: Declines. MUSCULOSKELETAL: Full range of motion, normal gait, without edema. SKIN: Dry, intact without rashes or lesions. No jaundice. NEURO: Alert, oriented, no focal deficits. PSYCH: Cooperative, normal mood and affect. EXTREMITIES: Bilateral leg  swelling noted.      Ryan JONELLE Coombs, PA-C 4:06 PM      [1]  Allergies Allergen Reactions   Flecainide  Other (See Comments)    dizziness, shortness of breath, blurred vision   Metoprolol  Tartrate Itching   Rofecoxib Rash and Other (See Comments)    (Vioxx)   "

## 2024-07-22 NOTE — Progress Notes (Signed)
 Agree with the assessment and plan as outlined by Alan Coombs, PA-C.  Agree with plan for virtual colonoscopy for follow-up of positive Cologuard in this patient who is otherwise at high risk for optical colonoscopy.  Gilbert Manolis, DO, [facility]

## 2024-07-24 ENCOUNTER — Ambulatory Visit: Payer: Self-pay

## 2024-07-24 VITALS — BP 109/72 | HR 98 | Temp 99.0°F | Ht 68.0 in | Wt 282.0 lb

## 2024-07-24 DIAGNOSIS — Z0000 Encounter for general adult medical examination without abnormal findings: Secondary | ICD-10-CM
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
## 2024-07-24 NOTE — Patient Instructions (Signed)
 Ryan Garner,  Thank you for taking the time for your Medicare Wellness Visit. I appreciate your continued commitment to your health goals. Please review the care plan we discussed, and feel free to reach out if I can assist you further.  Please note that Annual Wellness Visits do not include a physical exam. Some assessments may be limited, especially if the visit was conducted virtually. If needed, we may recommend an in-person follow-up with your provider.  Ongoing Care Seeing your primary care provider every 3 to 6 months helps us  monitor your health and provide consistent, personalized care.   Referrals If a referral was made during today's visit and you haven't received any updates within two weeks, please contact the referred provider directly to check on the status.  Recommended Screenings:  Health Maintenance  Topic Date Due   COVID-19 Vaccine (1) Never done   Eye exam for diabetics  Never done   Zoster (Shingles) Vaccine (1 of 2) Never done   Medicare Annual Wellness Visit  07/05/2023   Hemoglobin A1C  06/10/2024   Flu Shot  10/20/2024*   Colon Cancer Screening  12/08/2024*   Yearly kidney function blood test for diabetes  09/03/2024   Yearly kidney health urinalysis for diabetes  09/10/2024   Complete foot exam   12/08/2024   DTaP/Tdap/Td vaccine (7 - Td or Tdap) 07/30/2026   Pneumococcal Vaccine for age over 98  Completed   Meningitis B Vaccine  Aged Out   Hepatitis B Vaccine  Discontinued   Hepatitis C Screening  Discontinued  *Topic was postponed. The date shown is not the original due date.       07/21/2024    6:05 AM  Advanced Directives  Does Patient Have a Medical Advance Directive? No  Would patient like information on creating a medical advance directive? No - Patient declined    Vision: Annual vision screenings are recommended for early detection of glaucoma, cataracts, and diabetic retinopathy. These exams can also reveal signs of chronic conditions such  as diabetes and high blood pressure.  Dental: Annual dental screenings help detect early signs of oral cancer, gum disease, and other conditions linked to overall health, including heart disease and diabetes.  Please see the attached documents for additional preventive care recommendations.

## 2024-07-24 NOTE — Telephone Encounter (Signed)
 FYI Only or Action Required?: FYI only for provider: appointment scheduled on 1.5.26.  Patient was last seen in primary care on 07/10/2024 by Gladis Mustard, FNP.  Called Nurse Triage reporting Foot Swelling.  Symptoms began several days ago.  Interventions attempted: Prescription medications: finished keflex .  Symptoms are: gradually worsening.  Triage Disposition: See Physician Within 24 Hours  Patient/caregiver understands and will follow disposition?: Yes   Copied from CRM #[id number]. Topic: Clinical - Red Word Triage >> Jul 24, 2024  3:03 PM Roselie BROCKS wrote: Red Word that prompted transfer to Nurse Triage: Patient states his left foot is really inflamed and in severe pain. Reason for Disposition  [1] Finished taking antibiotic AND [2] wound infection symptoms are WORSE (e.g., draining pus, pain, red streak, spreading redness, swelling)  Answer Assessment - Initial Assessment Questions Has had multiple appts regarding this. States ongoing for 4-5 weeks. Finished latest atbx (keflex ) 4 days ago. States after finishing it redness and swelling started to get worse again along with the pain. After each question Rn asked he said I just need to see a doctor on Monday. Rn explained trying to rule out any urgent things that might require him to go to the Er. Pt states he did have a slight fever today of 100. RN did advise that if fever gets any higher, swelling or redness increase at all, he should go to the ER over the weekend. Pt states understanding.     1. SYMPTOM: What's the main symptom you're concerned about? (e.g., redness, swelling, pain, fever, weakness)     Redness, swelling. Pain, fever 2. WOUND INFECTION LOCATION: Where is the wound infection located? (e.g., arm, face, foot, knee, leg)     Left foot 3. WOUND INFECTION SIZE: What is the size of the red area? (e.g., inches, centimeters; compare to size of a coin)       4. BETTER-SAME-WORSE: Are you getting  better, staying the same, or getting worse compared to the day you started the antibiotics?      Worse since stopping 5. PAIN: Do you have any pain?  If Yes, ask: How bad is the pain?  (e.g., Scale 1-10; mild, moderate, or severe)      6. FEVER: Do you have a fever? If Yes, ask: What is it, how was it measured and when did it start?     100 7. OTHER SYMPTOMS: Do you have any other symptoms? (e.g., pus coming from a wound, red streaks, weakness)     denies 8. DIAGNOSIS DATE: When was the wound infection diagnosed? By whom?      12.5.25 9. ANTIBIOTIC NAME: What antibiotic(s) are you taking?  How many times a day? Note: Be sure the patient is taking the antibiotic as directed.      Has tried cipro  and keflex  10. ANTIBIOTIC DATE: When was the antibiotic started?        11. FOLLOW-UP APPOINTMENT: Do you have a follow-up appointment with your doctor?       none  Protocols used: Wound Infection on Antibiotic Follow-up Call-A-AH
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
## 2024-07-27 ENCOUNTER — Ambulatory Visit: Admitting: Nurse Practitioner

## 2024-07-27 ENCOUNTER — Encounter: Payer: Self-pay | Admitting: Nurse Practitioner

## 2024-07-27 VITALS — BP 121/74 | HR 75 | Temp 97.4°F | Ht 68.0 in | Wt 279.0 lb

## 2024-07-27 DIAGNOSIS — L03116 Cellulitis of left lower limb: Secondary | ICD-10-CM

## 2024-07-27 MED ORDER — CIPROFLOXACIN HCL 500 MG PO TABS: 500.0000 mg | ORAL_TABLET | Freq: Two times a day (BID) | ORAL | 0 refills | Status: AC

## 2024-07-27 MED ORDER — CEFTRIAXONE SODIUM 1 G IJ SOLR
1.0000 g | Freq: Once | INTRAMUSCULAR | Status: AC
  Administered 2024-07-27: 1 g via INTRAMUSCULAR

## 2024-07-27 NOTE — Patient Instructions (Signed)
 Cellulitis, Adult    Cellulitis is a skin infection. The infected area is often warm, red, swollen, and sore. It occurs most often on the legs, feet, and toes, but can happen on any part of the body.  This condition can be life-threatening without treatment. It is very important to get treated right away.  What are the causes?  This condition is caused by bacteria. The bacteria enter through a break in the skin, such as:  A cut.  A burn.  A bug bite.  An animal bite.  An open sore.  A crack.  What increases the risk?  Having a weak body's defense system (immune system).  Being older than 81 years old.  Having a blood sugar problem (diabetes).  Having a long-term liver disease (cirrhosis) or kidney disease.  Being very overweight (obese).  Having a skin problem, such as:  An itchy rash.  A rash caused by a fungus.  A rash with blisters.  Slow movement of blood in the veins (venous stasis).  Fluid buildup below the skin (edema).  This condition is more likely to occur in people who:  Have open cuts, burns, bites, or scrapes on the skin.  Have been treated with high-energy rays (radiation).  Use IV drugs.  What are the signs or symptoms?  Skin that:  Looks red or purple, or slightly darker than your usual skin color.  Has streaks.  Has spots.  Is swollen.  Is sore or painful when you touch it.  Is warm.  A fever.  Chills.  Blisters.  Tiredness (fatigue).  How is this treated?  Medicines to treat infections or allergies.  Rest.  Placing cold or warm cloths on the skin.  Staying in the hospital, if the condition is very bad. You may need medicines through an IV.  Follow these instructions at home:  Medicines  Take over-the-counter and prescription medicines only as told by your doctor.  If you were prescribed antibiotics, take them as told by your doctor. Do not stop using them even if you start to feel better.  General instructions  Drink enough fluid to keep your pee (urine) pale yellow.  Do not touch or rub the  infected area.  Raise (elevate) the infected area above the level of your heart while you are sitting or lying down.  Return to your normal activities when your doctor says that it is safe.  Place cold or warm cloths on the area as told by your doctor.  Keep all follow-up visits. Your doctor will need to make sure that a more serious infection is not developing.  Contact a doctor if:  You have a fever.  You do not start to get better after 1-2 days of treatment.  Your bone or joint under the infected area starts to hurt after the skin has healed.  Your infection comes back in the same area or another area. Signs of this may include:  You have a swollen bump in the area.  Your red area gets larger, turns dark in color, or hurts more.  You have more fluid coming from the wound.  Pus or a bad smell develops in your infected area.  You have more pain.  You feel sick and have muscle aches and weakness.  You develop vomiting or watery poop that will not go away.  Get help right away if:  You see red streaks coming from the area.  You notice the skin turns purple or black and falls  off.  These symptoms may be an emergency. Get help right away. Call 911.  Do not wait to see if the symptoms will go away.  Do not drive yourself to the hospital.  This information is not intended to replace advice given to you by your health care provider. Make sure you discuss any questions you have with your health care provider.  Document Revised: 03/06/2022 Document Reviewed: 03/06/2022  Elsevier Patient Education  2024 [employer].
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
## 2024-08-04 ENCOUNTER — Telehealth: Payer: Self-pay | Admitting: Physician Assistant

## 2024-08-04 NOTE — Telephone Encounter (Signed)
 Inbound call from patient stating that he would like to know if she is wanting him to have a CAT scan or PET scan. Patient is requesting a call back.Please advise.

## 2024-08-04 NOTE — Telephone Encounter (Signed)
 Spoke with the patient. He was scheduled for the virtual colonoscopy on 08/14/24. The patient canceled the appointment because he thought it was a actual colonoscopy.  Explained to the patient that this was a cat scan and it is called a virtual colonoscopy. It does require a colon prep like a colonoscopy would, but there is no sedation. He agrees to reschedule his test.

## 2024-08-07 ENCOUNTER — Ambulatory Visit: Admitting: Family Medicine

## 2024-08-07 ENCOUNTER — Encounter: Payer: Self-pay | Admitting: Family Medicine

## 2024-08-07 VITALS — BP 131/78 | HR 68 | Ht 68.0 in | Wt 286.0 lb

## 2024-08-07 DIAGNOSIS — S9782XD Crushing injury of left foot, subsequent encounter: Secondary | ICD-10-CM | POA: Diagnosis not present

## 2024-08-07 DIAGNOSIS — L03116 Cellulitis of left lower limb: Secondary | ICD-10-CM

## 2024-08-07 MED ORDER — SULFAMETHOXAZOLE-TRIMETHOPRIM 800-160 MG PO TABS: 1.0000 | ORAL_TABLET | Freq: Two times a day (BID) | ORAL | 0 refills | Status: AC
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
## 2024-08-13 ENCOUNTER — Ambulatory Visit: Admitting: Orthopedic Surgery

## 2024-08-13 DIAGNOSIS — I890 Lymphedema, not elsewhere classified: Secondary | ICD-10-CM

## 2024-08-13 DIAGNOSIS — I872 Venous insufficiency (chronic) (peripheral): Secondary | ICD-10-CM

## 2024-08-13 DIAGNOSIS — M79672 Pain in left foot: Secondary | ICD-10-CM

## 2024-08-14 ENCOUNTER — Other Ambulatory Visit
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
## 2024-08-26 ENCOUNTER — Other Ambulatory Visit
